# Patient Record
Sex: Female | Born: 1965 | Race: White | Hispanic: No | Marital: Married | Smoking: Former smoker
Health system: Southern US, Community
[De-identification: ages and names within clinical notes are randomized; demographics above are authoritative.]

## PROBLEM LIST (undated history)

## (undated) DIAGNOSIS — I10 Essential (primary) hypertension: Secondary | ICD-10-CM

## (undated) DIAGNOSIS — E781 Pure hyperglyceridemia: Secondary | ICD-10-CM

## (undated) DIAGNOSIS — M1990 Unspecified osteoarthritis, unspecified site: Secondary | ICD-10-CM

## (undated) DIAGNOSIS — M439 Deforming dorsopathy, unspecified: Secondary | ICD-10-CM

## (undated) DIAGNOSIS — G8190 Hemiplegia, unspecified affecting unspecified side: Secondary | ICD-10-CM

## (undated) DIAGNOSIS — R4701 Aphasia: Secondary | ICD-10-CM

## (undated) DIAGNOSIS — G43909 Migraine, unspecified, not intractable, without status migrainosus: Secondary | ICD-10-CM

## (undated) DIAGNOSIS — T7840XA Allergy, unspecified, initial encounter: Secondary | ICD-10-CM

## (undated) DIAGNOSIS — Z87442 Personal history of urinary calculi: Secondary | ICD-10-CM

## (undated) DIAGNOSIS — F32A Depression, unspecified: Secondary | ICD-10-CM

## (undated) DIAGNOSIS — J329 Chronic sinusitis, unspecified: Secondary | ICD-10-CM

## (undated) DIAGNOSIS — N200 Calculus of kidney: Secondary | ICD-10-CM

## (undated) DIAGNOSIS — I639 Cerebral infarction, unspecified: Secondary | ICD-10-CM

## (undated) DIAGNOSIS — C181 Malignant neoplasm of appendix: Secondary | ICD-10-CM

## (undated) DIAGNOSIS — F419 Anxiety disorder, unspecified: Secondary | ICD-10-CM

## (undated) HISTORY — PX: ANTERIOR CRUCIATE LIGAMENT REPAIR: SHX115

## (undated) HISTORY — PX: LIPOSUCTION: SHX10

## (undated) HISTORY — PX: MENISCUS REPAIR: SHX5179

## (undated) HISTORY — DX: Allergy, unspecified, initial encounter: T78.40XA

## (undated) HISTORY — PX: BUNIONECTOMY: SHX129

## (undated) HISTORY — PX: VARICOSE VEIN SURGERY: SHX832

## (undated) HISTORY — PX: LITHOTRIPSY: SUR834

## (undated) HISTORY — PX: SHOULDER SURGERY: SHX246

## (undated) HISTORY — PX: NASAL SEPTUM SURGERY: SHX37

## (undated) HISTORY — PX: BREAST SURGERY: SHX581

## (undated) HISTORY — DX: Unspecified osteoarthritis, unspecified site: M19.90

## (undated) HISTORY — PX: REDUCTION MAMMAPLASTY: SUR839

## (undated) HISTORY — PX: LASIK: SHX215

---

## 2006-07-30 HISTORY — PX: LASIK: SHX215

## 2014-12-05 ENCOUNTER — Emergency Department (HOSPITAL_COMMUNITY)
Admission: EM | Admit: 2014-12-05 | Discharge: 2014-12-05 | Disposition: A | Discharge status: Home / Self Care | Payer: Managed Care, Other (non HMO) | Attending: Emergency Medicine | Admitting: Emergency Medicine

## 2014-12-05 ENCOUNTER — Encounter (HOSPITAL_COMMUNITY): Payer: Self-pay | Admitting: *Deleted

## 2014-12-05 DIAGNOSIS — Y998 Other external cause status: Secondary | ICD-10-CM | POA: Diagnosis not present

## 2014-12-05 DIAGNOSIS — Y92410 Unspecified street and highway as the place of occurrence of the external cause: Secondary | ICD-10-CM | POA: Diagnosis not present

## 2014-12-05 DIAGNOSIS — S4991XA Unspecified injury of right shoulder and upper arm, initial encounter: Secondary | ICD-10-CM | POA: Diagnosis not present

## 2014-12-05 DIAGNOSIS — Y9389 Activity, other specified: Secondary | ICD-10-CM | POA: Diagnosis not present

## 2014-12-05 DIAGNOSIS — S199XXA Unspecified injury of neck, initial encounter: Secondary | ICD-10-CM | POA: Diagnosis present

## 2014-12-05 DIAGNOSIS — S161XXA Strain of muscle, fascia and tendon at neck level, initial encounter: Secondary | ICD-10-CM

## 2014-12-05 DIAGNOSIS — Z880 Allergy status to penicillin: Secondary | ICD-10-CM | POA: Diagnosis not present

## 2014-12-05 DIAGNOSIS — S0990XA Unspecified injury of head, initial encounter: Secondary | ICD-10-CM | POA: Diagnosis not present

## 2014-12-05 MED ORDER — DIAZEPAM 5 MG PO TABS
5.0000 mg | ORAL_TABLET | Freq: Three times a day (TID) | ORAL | Status: DC | PRN
  Administered 2014-12-05: 5 mg via ORAL
  Filled 2014-12-05: qty 1

## 2014-12-05 MED ORDER — DIAZEPAM 5 MG PO TABS: 5.0000 mg | ORAL_TABLET | Freq: Three times a day (TID) | ORAL | Status: DC | PRN
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
## 2014-12-14 ENCOUNTER — Emergency Department (HOSPITAL_COMMUNITY)
Admission: EM | Admit: 2014-12-14 | Discharge: 2014-12-14 | Disposition: A | Discharge status: Home / Self Care | Payer: Managed Care, Other (non HMO) | Attending: Emergency Medicine | Admitting: Emergency Medicine

## 2014-12-14 ENCOUNTER — Encounter (HOSPITAL_COMMUNITY): Payer: Self-pay

## 2014-12-14 ENCOUNTER — Emergency Department (HOSPITAL_COMMUNITY): Payer: Managed Care, Other (non HMO)

## 2014-12-14 DIAGNOSIS — Z87442 Personal history of urinary calculi: Secondary | ICD-10-CM | POA: Insufficient documentation

## 2014-12-14 DIAGNOSIS — Z8679 Personal history of other diseases of the circulatory system: Secondary | ICD-10-CM | POA: Insufficient documentation

## 2014-12-14 DIAGNOSIS — Z8639 Personal history of other endocrine, nutritional and metabolic disease: Secondary | ICD-10-CM | POA: Diagnosis not present

## 2014-12-14 DIAGNOSIS — F419 Anxiety disorder, unspecified: Secondary | ICD-10-CM | POA: Insufficient documentation

## 2014-12-14 DIAGNOSIS — R51 Headache: Secondary | ICD-10-CM | POA: Diagnosis present

## 2014-12-14 DIAGNOSIS — Z880 Allergy status to penicillin: Secondary | ICD-10-CM | POA: Diagnosis not present

## 2014-12-14 DIAGNOSIS — F0781 Postconcussional syndrome: Secondary | ICD-10-CM | POA: Diagnosis not present

## 2014-12-14 DIAGNOSIS — Z872 Personal history of diseases of the skin and subcutaneous tissue: Secondary | ICD-10-CM | POA: Insufficient documentation

## 2014-12-14 HISTORY — DX: Pure hyperglyceridemia: E78.1

## 2014-12-14 HISTORY — DX: Chronic sinusitis, unspecified: J32.9

## 2014-12-14 HISTORY — DX: Calculus of kidney: N20.0

## 2014-12-14 HISTORY — DX: Migraine, unspecified, not intractable, without status migrainosus: G43.909

## 2014-12-14 HISTORY — DX: Anxiety disorder, unspecified: F41.9

## 2014-12-14 MED ORDER — IBUPROFEN 800 MG PO TABS: 800.0000 mg | ORAL_TABLET | Freq: Three times a day (TID) | ORAL | Status: DC | PRN

## 2014-12-14 MED ORDER — SODIUM CHLORIDE 0.9 % IV BOLUS (SEPSIS)
1000.0000 mL | Freq: Once | INTRAVENOUS | Status: AC
  Administered 2014-12-14: 1000 mL via INTRAVENOUS

## 2014-12-14 MED ORDER — PROCHLORPERAZINE EDISYLATE 5 MG/ML IJ SOLN
10.0000 mg | Freq: Once | INTRAMUSCULAR | Status: AC
  Administered 2014-12-14: 10 mg via INTRAVENOUS
  Filled 2014-12-14: qty 2

## 2014-12-14 MED ORDER — KETOROLAC TROMETHAMINE 30 MG/ML IJ SOLN
30.0000 mg | Freq: Once | INTRAMUSCULAR | Status: AC
  Administered 2014-12-14: 30 mg via INTRAVENOUS
  Filled 2014-12-14: qty 1

## 2014-12-14 MED ORDER — BUTALBITAL-APAP-CAFFEINE 50-325-40 MG PO TABS: 1.0000 | ORAL_TABLET | Freq: Four times a day (QID) | ORAL | Status: DC | PRN

## 2014-12-14 MED ORDER — FENTANYL CITRATE (PF) 100 MCG/2ML IJ SOLN
100.0000 ug | Freq: Once | INTRAMUSCULAR | Status: AC
  Administered 2014-12-14: 100 ug via INTRAVENOUS
  Filled 2014-12-14: qty 2

## 2014-12-14 MED ORDER — DEXAMETHASONE SODIUM PHOSPHATE 10 MG/ML IJ SOLN
10.0000 mg | Freq: Once | INTRAMUSCULAR | Status: AC
  Administered 2014-12-14: 10 mg via INTRAVENOUS
  Filled 2014-12-14: qty 1

## 2014-12-14 MED ORDER — DIPHENHYDRAMINE HCL 50 MG/ML IJ SOLN
25.0000 mg | Freq: Once | INTRAMUSCULAR | Status: AC
  Administered 2014-12-14: 25 mg via INTRAVENOUS
  Filled 2014-12-14: qty 1

## 2014-12-14 NOTE — ED Notes (Signed)
Patient transported to CT 
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
## 2014-12-14 NOTE — Discharge Instructions (Signed)
Return here as needed.  Follow-up with the neurologist provided.  Increase your fluid intake
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
## 2014-12-16 ENCOUNTER — Other Ambulatory Visit: Payer: Self-pay | Admitting: Family Medicine

## 2014-12-16 DIAGNOSIS — E041 Nontoxic single thyroid nodule: Secondary | ICD-10-CM

## 2014-12-17 ENCOUNTER — Ambulatory Visit
Admission: RE | Admit: 2014-12-17 | Discharge: 2014-12-17 | Disposition: A | Discharge status: Home / Self Care | Payer: Managed Care, Other (non HMO) | Source: Ambulatory Visit | Attending: Family Medicine | Admitting: Family Medicine

## 2014-12-17 DIAGNOSIS — E041 Nontoxic single thyroid nodule: Secondary | ICD-10-CM

## 2014-12-20 ENCOUNTER — Other Ambulatory Visit: Payer: Self-pay | Admitting: Family Medicine

## 2014-12-20 DIAGNOSIS — E041 Nontoxic single thyroid nodule: Secondary | ICD-10-CM

## 2014-12-21 ENCOUNTER — Other Ambulatory Visit: Payer: Self-pay | Admitting: Family Medicine

## 2014-12-21 ENCOUNTER — Encounter: Payer: Self-pay | Admitting: Neurology

## 2014-12-21 ENCOUNTER — Ambulatory Visit (INDEPENDENT_AMBULATORY_CARE_PROVIDER_SITE_OTHER): Payer: Managed Care, Other (non HMO) | Admitting: Neurology

## 2014-12-21 VITALS — BP 140/80 | HR 98 | Resp 20 | Ht 67.0 in | Wt 185.3 lb

## 2014-12-21 DIAGNOSIS — G44301 Post-traumatic headache, unspecified, intractable: Secondary | ICD-10-CM

## 2014-12-21 DIAGNOSIS — E041 Nontoxic single thyroid nodule: Secondary | ICD-10-CM

## 2014-12-21 DIAGNOSIS — R519 Headache, unspecified: Secondary | ICD-10-CM | POA: Insufficient documentation

## 2014-12-21 DIAGNOSIS — R51 Headache: Secondary | ICD-10-CM
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
## 2014-12-21 NOTE — Patient Instructions (Signed)
1.  Stop the butalbital-acetaminophen-caffeine 2.  Limit use of ibuprofen to no more than 2 days out of the week 3.  Will refer you to either physical therapy or Dr. Hulan Saas 4.  FOllow up at end of July

## 2014-12-22 ENCOUNTER — Other Ambulatory Visit: Payer: Self-pay | Admitting: Family Medicine

## 2014-12-23 ENCOUNTER — Encounter: Payer: Self-pay | Admitting: Family Medicine

## 2014-12-23 ENCOUNTER — Other Ambulatory Visit: Payer: Self-pay | Admitting: *Deleted

## 2014-12-23 ENCOUNTER — Ambulatory Visit (INDEPENDENT_AMBULATORY_CARE_PROVIDER_SITE_OTHER): Payer: Managed Care, Other (non HMO) | Admitting: Family Medicine

## 2014-12-23 VITALS — BP 136/88 | HR 91 | Ht 67.0 in | Wt 183.0 lb

## 2014-12-23 DIAGNOSIS — G44301 Post-traumatic headache, unspecified, intractable: Secondary | ICD-10-CM

## 2014-12-23 MED ORDER — GABAPENTIN 100 MG PO CAPS: 200.0000 mg | ORAL_CAPSULE | Freq: Every day | ORAL | Status: DC

## 2014-12-23 NOTE — Assessment & Plan Note (Signed)
Difficult to keep patient on track today. Patient seems significantly anxious. Patient's unwilling to try osteopathic manipulation today. We did discuss other treatment options a could be beneficial. Patient given home exercises and icing protocol. We discussed other over-the-counter medications. Discuss because the patient's pain is worse at night trying [place] low dose of gabapentin could be more beneficial. Discussed trying to follow neurology's recommendations would also be helpful which patient is not doing at this time. Patient is traveling out of the state for the next 2 weeks and will come back afterwards. We will discuss the possibility of osteopathic manipulation after this. Patient responds better to the treatment suggested and patient will be more compliant.

## 2014-12-23 NOTE — Telephone Encounter (Signed)
Gabapentin sent into [facility].

## 2014-12-23 NOTE — Progress Notes (Signed)
Pre visit review using our clinic review tool, if applicable. No additional management support is needed unless otherwise documented below in the visit note. 
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
## 2014-12-28 ENCOUNTER — Telehealth: Payer: Self-pay | Admitting: Family Medicine

## 2014-12-28 NOTE — Telephone Encounter (Signed)
Pt called in and has a couple questions about what is exactly going on with her for ins   Best number [phone]

## 2014-12-28 NOTE — Telephone Encounter (Signed)
Per pt's request, mailed her the OV notes from when she saw Dr. Tamala Julian.

## 2015-01-17 ENCOUNTER — Encounter: Payer: Self-pay | Admitting: Family Medicine

## 2015-01-17 ENCOUNTER — Ambulatory Visit (INDEPENDENT_AMBULATORY_CARE_PROVIDER_SITE_OTHER): Payer: Managed Care, Other (non HMO) | Admitting: Family Medicine

## 2015-01-17 VITALS — BP 134/86 | HR 89 | Ht 67.0 in | Wt 183.0 lb

## 2015-01-17 DIAGNOSIS — G44301 Post-traumatic headache, unspecified, intractable: Secondary | ICD-10-CM | POA: Diagnosis not present

## 2015-01-17 MED ORDER — HYDROXYZINE HCL 25 MG PO TABS: 25.0000 mg | ORAL_TABLET | Freq: Three times a day (TID) | ORAL | Status: DC | PRN

## 2015-01-17 NOTE — Patient Instructions (Addendum)
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  Continue the vitamins Hydroxyzine up to 3 times a day for anxiety and headaches.  Tart cherry extract can help after working out on the back as well. See me again in 4-6 weeks to check in one more time.
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
## 2015-01-17 NOTE — Assessment & Plan Note (Signed)
Do believe that patient's headaches is secondary to more of the muscle imbalances, stress, and patient's postconcussive syndrome is now resolved. Patient was given some hydroxyzine to see if this would be beneficial for some of her anxiety type issues. We discussed continuing the home exercises as well as continuing the natural supplementations. Patient has noticed some improvement with these. Patient and will come back and see me again in 4-6 weeks for further evaluation and treatment.  Spent  25 minutes with patient face-to-face and had greater than 50% of counseling including as described above in assessment and plan.

## 2015-01-17 NOTE — Progress Notes (Signed)
Pre visit review using our clinic review tool, if applicable. No additional management support is needed unless otherwise documented below in the visit note. 

## 2015-01-18 ENCOUNTER — Ambulatory Visit
Admission: RE | Admit: 2015-01-18 | Discharge: 2015-01-18 | Disposition: A | Discharge status: Home / Self Care | Payer: Managed Care, Other (non HMO) | Source: Ambulatory Visit | Attending: Family Medicine | Admitting: Family Medicine

## 2015-01-18 ENCOUNTER — Other Ambulatory Visit (HOSPITAL_COMMUNITY)
Admission: RE | Admit: 2015-01-18 | Discharge: 2015-01-18 | Disposition: A | Discharge status: Home / Self Care | Payer: Managed Care, Other (non HMO) | Source: Ambulatory Visit | Attending: Interventional Radiology | Admitting: Interventional Radiology

## 2015-01-18 DIAGNOSIS — E041 Nontoxic single thyroid nodule: Secondary | ICD-10-CM

## 2015-02-21 ENCOUNTER — Encounter: Payer: Self-pay | Admitting: Neurology

## 2015-02-21 ENCOUNTER — Ambulatory Visit (INDEPENDENT_AMBULATORY_CARE_PROVIDER_SITE_OTHER): Payer: Managed Care, Other (non HMO) | Admitting: Neurology

## 2015-02-21 VITALS — BP 122/66 | HR 98 | Resp 20 | Ht 67.0 in | Wt 184.1 lb

## 2015-02-21 DIAGNOSIS — G44309 Post-traumatic headache, unspecified, not intractable: Secondary | ICD-10-CM

## 2015-02-21 DIAGNOSIS — F411 Generalized anxiety disorder: Secondary | ICD-10-CM | POA: Diagnosis not present
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
## 2015-03-01 ENCOUNTER — Ambulatory Visit: Payer: Managed Care, Other (non HMO) | Admitting: Family Medicine

## 2015-03-09 ENCOUNTER — Encounter: Payer: Self-pay | Admitting: Family Medicine

## 2015-03-09 ENCOUNTER — Ambulatory Visit (INDEPENDENT_AMBULATORY_CARE_PROVIDER_SITE_OTHER): Payer: Managed Care, Other (non HMO) | Admitting: Family Medicine

## 2015-03-09 VITALS — BP 130/84 | HR 94 | Ht 67.0 in | Wt 186.0 lb

## 2015-03-09 DIAGNOSIS — F411 Generalized anxiety disorder: Secondary | ICD-10-CM | POA: Diagnosis not present

## 2015-03-09 DIAGNOSIS — G44309 Post-traumatic headache, unspecified, not intractable: Secondary | ICD-10-CM | POA: Diagnosis not present

## 2015-03-09 NOTE — Patient Instructions (Addendum)
Good to see you Continue the hydroxyzine when you need it I wish you the best with your cat. I will refill the medicine when you need it See me when you need me.
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
## 2015-03-09 NOTE — Assessment & Plan Note (Signed)
Patient is doing significantly better at this time. I feel that she is back at her baseline and is not on any medications on a regular basis. Patient states she's been able to exercise on areolar basis as well without any significant discomfort. Patient's blood pressure is normal. I think the patient is at maximal improvement. Patient has beenreleased and can follow-up as needed.

## 2015-03-09 NOTE — Assessment & Plan Note (Signed)
Continue atarax.  See me PRN.

## 2015-03-09 NOTE — Progress Notes (Signed)
Pre visit review using our clinic review tool, if applicable. No additional management support is needed unless otherwise documented below in the visit note. 

## 2016-12-06 ENCOUNTER — Other Ambulatory Visit: Payer: Self-pay | Admitting: Family Medicine

## 2016-12-06 DIAGNOSIS — Z1231 Encounter for screening mammogram for malignant neoplasm of breast: Secondary | ICD-10-CM

## 2016-12-26 ENCOUNTER — Ambulatory Visit
Admission: RE | Admit: 2016-12-26 | Discharge: 2016-12-26 | Disposition: A | Discharge status: Home / Self Care | Payer: Managed Care, Other (non HMO) | Source: Ambulatory Visit | Attending: Family Medicine | Admitting: Family Medicine

## 2016-12-26 DIAGNOSIS — Z1231 Encounter for screening mammogram for malignant neoplasm of breast: Secondary | ICD-10-CM

## 2017-01-17 ENCOUNTER — Encounter: Payer: Self-pay | Admitting: Family Medicine

## 2017-03-08 ENCOUNTER — Other Ambulatory Visit: Payer: Self-pay | Admitting: Family Medicine

## 2017-03-11 NOTE — Telephone Encounter (Signed)
Refill denied. Pt has not been seen in 2 years.

## 2017-12-11 ENCOUNTER — Encounter: Payer: Self-pay | Admitting: Gastroenterology

## 2017-12-12 ENCOUNTER — Other Ambulatory Visit: Payer: Self-pay | Admitting: Family Medicine

## 2017-12-12 DIAGNOSIS — Z1231 Encounter for screening mammogram for malignant neoplasm of breast: Secondary | ICD-10-CM

## 2018-01-21 ENCOUNTER — Emergency Department (HOSPITAL_COMMUNITY)
Admission: EM | Admit: 2018-01-21 | Discharge: 2018-01-21 | Disposition: A | Discharge status: Home / Self Care | Payer: Managed Care, Other (non HMO) | Attending: Emergency Medicine | Admitting: Emergency Medicine

## 2018-01-21 ENCOUNTER — Encounter (HOSPITAL_COMMUNITY): Payer: Self-pay

## 2018-01-21 ENCOUNTER — Other Ambulatory Visit: Payer: Self-pay

## 2018-01-21 ENCOUNTER — Emergency Department (HOSPITAL_COMMUNITY): Payer: Managed Care, Other (non HMO)

## 2018-01-21 DIAGNOSIS — M542 Cervicalgia: Secondary | ICD-10-CM | POA: Diagnosis not present

## 2018-01-21 DIAGNOSIS — Y9389 Activity, other specified: Secondary | ICD-10-CM | POA: Insufficient documentation

## 2018-01-21 DIAGNOSIS — Z79899 Other long term (current) drug therapy: Secondary | ICD-10-CM | POA: Insufficient documentation

## 2018-01-21 DIAGNOSIS — H5789 Other specified disorders of eye and adnexa: Secondary | ICD-10-CM | POA: Diagnosis not present

## 2018-01-21 DIAGNOSIS — Y998 Other external cause status: Secondary | ICD-10-CM | POA: Diagnosis not present

## 2018-01-21 DIAGNOSIS — Y92410 Unspecified street and highway as the place of occurrence of the external cause: Secondary | ICD-10-CM | POA: Diagnosis not present

## 2018-01-21 DIAGNOSIS — R51 Headache: Secondary | ICD-10-CM | POA: Diagnosis not present

## 2018-01-21 DIAGNOSIS — R110 Nausea: Secondary | ICD-10-CM | POA: Insufficient documentation

## 2018-01-21 DIAGNOSIS — H53149 Visual discomfort, unspecified: Secondary | ICD-10-CM | POA: Diagnosis not present

## 2018-01-21 DIAGNOSIS — Z87891 Personal history of nicotine dependence: Secondary | ICD-10-CM | POA: Insufficient documentation

## 2018-01-21 MED ORDER — METHOCARBAMOL 500 MG PO TABS: 500.0000 mg | ORAL_TABLET | Freq: Two times a day (BID) | ORAL | 0 refills | Status: DC

## 2018-01-21 MED ORDER — IBUPROFEN 600 MG PO TABS: 600.0000 mg | ORAL_TABLET | Freq: Four times a day (QID) | ORAL | 0 refills | Status: DC | PRN

## 2018-01-21 MED ORDER — METOCLOPRAMIDE HCL 5 MG/ML IJ SOLN
10.0000 mg | Freq: Once | INTRAMUSCULAR | Status: AC
  Administered 2018-01-21: 10 mg via INTRAVENOUS
  Filled 2018-01-21: qty 2

## 2018-01-21 MED ORDER — KETOROLAC TROMETHAMINE 30 MG/ML IJ SOLN
30.0000 mg | Freq: Once | INTRAMUSCULAR | Status: AC
  Administered 2018-01-21: 30 mg via INTRAVENOUS
  Filled 2018-01-21: qty 1

## 2018-01-21 MED ORDER — SODIUM CHLORIDE 0.9 % IV BOLUS
1000.0000 mL | Freq: Once | INTRAVENOUS | Status: AC
  Administered 2018-01-21: 1000 mL via INTRAVENOUS

## 2018-01-21 MED ORDER — ACETAMINOPHEN 500 MG PO TABS: 500.0000 mg | ORAL_TABLET | Freq: Four times a day (QID) | ORAL | 0 refills | Status: DC | PRN
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
## 2018-01-21 NOTE — ED Notes (Signed)
Pt. Unable to sign. Pt. Did not have questions about paperwork.

## 2018-01-29 ENCOUNTER — Ambulatory Visit (AMBULATORY_SURGERY_CENTER): Payer: Self-pay | Admitting: *Deleted

## 2018-01-29 ENCOUNTER — Other Ambulatory Visit: Payer: Self-pay

## 2018-01-29 DIAGNOSIS — Z1211 Encounter for screening for malignant neoplasm of colon: Secondary | ICD-10-CM

## 2018-01-29 MED ORDER — SUPREP BOWEL PREP KIT 17.5-3.13-1.6 GM/177ML PO SOLN: 1.0000 | Freq: Once | ORAL | 0 refills | Status: AC
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
## 2018-02-12 ENCOUNTER — Encounter: Payer: Self-pay | Admitting: Gastroenterology

## 2018-02-12 ENCOUNTER — Ambulatory Visit (AMBULATORY_SURGERY_CENTER): Payer: Managed Care, Other (non HMO) | Admitting: Gastroenterology

## 2018-02-12 VITALS — BP 148/96 | HR 82 | Temp 99.1°F | Resp 22 | Ht 67.5 in | Wt 171.0 lb

## 2018-02-12 DIAGNOSIS — Z1211 Encounter for screening for malignant neoplasm of colon: Secondary | ICD-10-CM | POA: Diagnosis present

## 2018-02-12 DIAGNOSIS — D122 Benign neoplasm of ascending colon: Secondary | ICD-10-CM

## 2018-02-12 MED ORDER — SODIUM CHLORIDE 0.9 % IV SOLN: 500.0000 mL | INTRAVENOUS | Status: DC

## 2018-02-12 NOTE — Patient Instructions (Signed)
YOU HAD AN ENDOSCOPIC PROCEDURE TODAY AT THE [facility]:   Refer to the procedure report that was given to you for any specific questions about what was found during the examination.  If the procedure report does not answer your questions, please call your gastroenterologist to clarify.  If you requested that your care partner not be given the details of your procedure findings, then the procedure report has been included in a sealed envelope for you to review at your convenience later.  YOU SHOULD EXPECT: Some feelings of bloating in the abdomen. Passage of more gas than usual.  Walking can help get rid of the air that was put into your GI tract during the procedure and reduce the bloating. If you had a lower endoscopy (such as a colonoscopy or flexible sigmoidoscopy) you may notice spotting of blood in your stool or on the toilet paper. If you underwent a bowel prep for your procedure, you may not have a normal bowel movement for a few days.  Please Note:  You might notice some irritation and congestion in your nose or some drainage.  This is from the oxygen used during your procedure.  There is no need for concern and it should clear up in a day or so.  SYMPTOMS TO REPORT IMMEDIATELY:   Following lower endoscopy (colonoscopy or flexible sigmoidoscopy):  Excessive amounts of blood in the stool  Significant tenderness or worsening of abdominal pains  Swelling of the abdomen that is new, acute  Fever of 100F or higher   For urgent or emergent issues, a gastroenterologist can be reached at any hour by calling [phone].   DIET:  We do recommend a small meal at first, but then you may proceed to your regular diet.  Drink plenty of fluids but you should avoid alcoholic beverages for 24 hours.  ACTIVITY:  You should plan to take it easy for the rest of today and you should NOT DRIVE or use heavy machinery until tomorrow (because of the sedation medicines used during the test).     FOLLOW UP: Our staff will call the number listed on your records the next business day following your procedure to check on you and address any questions or concerns that you may have regarding the information given to you following your procedure. If we do not reach you, we will leave a message.  However, if you are feeling well and you are not experiencing any problems, there is no need to return our call.  We will assume that you have returned to your regular daily activities without incident.  If any biopsies were taken you will be contacted by phone or by letter within the next 1-3 weeks.  Please call us at [phone] if you have not heard about the biopsies in 3 weeks.    SIGNATURES/CONFIDENTIALITY: You and/or your care partner have signed paperwork which will be entered into your electronic medical record.  These signatures attest to the fact that that the information above on your After Visit Summary has been reviewed and is understood.  Full responsibility of the confidentiality of this discharge information lies with you and/or your care-partner.  Polyp, diverticulosis, hemorrhoid information given. 

## 2018-02-12 NOTE — Progress Notes (Signed)
Report to PACU, RN, vss, BBS= Clear.  

## 2018-02-12 NOTE — Op Note (Signed)
[facility] Patient Name: Melanie Madden Procedure Date: 02/12/2018 1:15 PM MRN: [id number] Endoscopist: Mauri Pole , MD Age: 52 Referring MD:  Date of Birth: [birth year] Gender: Female Account #: [id number] Procedure:                Colonoscopy Indications:              Screening for colorectal malignant neoplasm Medicines:                Monitored Anesthesia Care Procedure:                Pre-Anesthesia Assessment:                           - Prior to the procedure, a History and Physical                            was performed, and patient medications and                            allergies were reviewed. The patient's tolerance of                            previous anesthesia was also reviewed. The risks                            and benefits of the procedure and the sedation                            options and risks were discussed with the patient.                            All questions were answered, and informed consent                            was obtained. Prior Anticoagulants: The patient has                            taken no previous anticoagulant or antiplatelet                            agents. ASA Grade Assessment: II - A patient with                            mild systemic disease. After reviewing the risks                            and benefits, the patient was deemed in                            satisfactory condition to undergo the procedure.                           After obtaining informed consent, the colonoscope  was passed under direct vision. Throughout the                            procedure, the patient's blood pressure, pulse, and                            oxygen saturations were monitored continuously. The                            Colonoscope was introduced through the anus and                            advanced to the the cecum, identified by                            appendiceal orifice and  ileocecal valve. The                            colonoscopy was performed without difficulty. The                            patient tolerated the procedure well. The quality                            of the bowel preparation was adequate. The                            ileocecal valve, appendiceal orifice, and rectum                            were photographed. Scope In: 1:35:38 PM Scope Out: 1:56:29 PM Scope Withdrawal Time: 0 hours 13 minutes 54 seconds  Total Procedure Duration: 0 hours 20 minutes 51 seconds  Findings:                 The perianal and digital rectal examinations were                            normal.                           Two sessile polyps were found in the ascending                            colon. The polyps were 1 to 2 mm in size. These                            polyps were removed with a cold biopsy forceps.                            Resection and retrieval were complete.                           A few small-mouthed diverticula were found in the  sigmoid colon.                           Non-bleeding internal hemorrhoids were found during                            retroflexion. The hemorrhoids were small. Complications:            No immediate complications. Estimated Blood Loss:     Estimated blood loss was minimal. Impression:               - Two 1 to 2 mm polyps in the ascending colon,                            removed with a cold biopsy forceps. Resected and                            retrieved.                           - Diverticulosis in the sigmoid colon.                           - Non-bleeding internal hemorrhoids. Recommendation:           - Patient has a contact number available for                            emergencies. The signs and symptoms of potential                            delayed complications were discussed with the                            patient. Return to normal activities tomorrow.                             Written discharge instructions were provided to the                            patient.                           - Resume previous diet.                           - Continue present medications.                           - Await pathology results.                           - Repeat colonoscopy in 5-10 years for surveillance                            based on pathology results. Mauri Pole, MD 02/12/2018 2:01:50 PM This report has been signed electronically.

## 2018-02-12 NOTE — Progress Notes (Signed)
Called to room to assist during endoscopic procedure.  Patient ID and intended procedure confirmed with present staff. Received instructions for my participation in the procedure from the performing physician.  
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
## 2018-02-13 ENCOUNTER — Telehealth: Payer: Self-pay

## 2018-02-13 NOTE — Telephone Encounter (Signed)
  Follow up Call-  Call Melanie Madden number 02/12/2018  Post procedure Call Melanie Madden phone  # [phone]  Permission to leave phone message Yes  Some recent data might be hidden     Patient questions:  Do you have a fever, pain , or abdominal swelling? No. Pain Score  0 *  Have you tolerated food without any problems? Yes.    Have you been able to return to your normal activities? Yes.    Do you have any questions about your discharge instructions: Diet   No. Medications  No. Follow up visit  No.  Do you have questions or concerns about your Care? No.  Actions: * If pain score is 4 or above: No action needed, pain <4.

## 2018-02-13 NOTE — Telephone Encounter (Signed)
Left message

## 2018-02-20 ENCOUNTER — Encounter: Payer: Self-pay | Admitting: Gastroenterology

## 2018-07-17 ENCOUNTER — Other Ambulatory Visit: Payer: Self-pay

## 2018-07-17 ENCOUNTER — Encounter (HOSPITAL_COMMUNITY): Payer: Self-pay

## 2018-07-17 ENCOUNTER — Emergency Department (HOSPITAL_COMMUNITY)
Admission: EM | Admit: 2018-07-17 | Discharge: 2018-07-18 | Disposition: A | Discharge status: Home / Self Care | Payer: Managed Care, Other (non HMO) | Attending: Emergency Medicine | Admitting: Emergency Medicine

## 2018-07-17 ENCOUNTER — Emergency Department (HOSPITAL_COMMUNITY): Payer: Managed Care, Other (non HMO)

## 2018-07-17 DIAGNOSIS — Z87891 Personal history of nicotine dependence: Secondary | ICD-10-CM | POA: Insufficient documentation

## 2018-07-17 DIAGNOSIS — Y999 Unspecified external cause status: Secondary | ICD-10-CM | POA: Insufficient documentation

## 2018-07-17 DIAGNOSIS — S161XXA Strain of muscle, fascia and tendon at neck level, initial encounter: Secondary | ICD-10-CM

## 2018-07-17 DIAGNOSIS — Y9389 Activity, other specified: Secondary | ICD-10-CM | POA: Insufficient documentation

## 2018-07-17 DIAGNOSIS — Z79899 Other long term (current) drug therapy: Secondary | ICD-10-CM | POA: Insufficient documentation

## 2018-07-17 DIAGNOSIS — Y929 Unspecified place or not applicable: Secondary | ICD-10-CM | POA: Insufficient documentation

## 2018-07-17 MED ORDER — METHOCARBAMOL 500 MG PO TABS
500.0000 mg | ORAL_TABLET | Freq: Once | ORAL | Status: AC
  Administered 2018-07-17: 500 mg via ORAL
  Filled 2018-07-17: qty 1

## 2018-07-17 MED ORDER — METHOCARBAMOL 500 MG PO TABS: 500.0000 mg | ORAL_TABLET | Freq: Three times a day (TID) | ORAL | 0 refills | Status: DC | PRN

## 2018-07-17 MED ORDER — NAPROXEN 500 MG PO TABS: 500.0000 mg | ORAL_TABLET | Freq: Two times a day (BID) | ORAL | 0 refills | Status: DC | PRN

## 2018-07-17 MED ORDER — OXYCODONE-ACETAMINOPHEN 5-325 MG PO TABS
1.0000 | ORAL_TABLET | Freq: Once | ORAL | Status: AC
  Administered 2018-07-17: 1 via ORAL
  Filled 2018-07-17: qty 1

## 2018-07-17 MED ORDER — NAPROXEN 250 MG PO TABS
500.0000 mg | ORAL_TABLET | Freq: Once | ORAL | Status: AC
  Administered 2018-07-17: 500 mg via ORAL
  Filled 2018-07-17: qty 2
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
## 2018-08-04 ENCOUNTER — Ambulatory Visit
Admission: RE | Admit: 2018-08-04 | Discharge: 2018-08-04 | Disposition: A | Discharge status: Home / Self Care | Payer: Managed Care, Other (non HMO) | Source: Ambulatory Visit | Attending: Family Medicine | Admitting: Family Medicine

## 2018-08-04 DIAGNOSIS — Z1231 Encounter for screening mammogram for malignant neoplasm of breast: Secondary | ICD-10-CM

## 2018-09-01 ENCOUNTER — Ambulatory Visit: Payer: 59 | Admitting: Psychology

## 2018-09-01 DIAGNOSIS — F418 Other specified anxiety disorders: Secondary | ICD-10-CM

## 2018-09-15 ENCOUNTER — Ambulatory Visit (INDEPENDENT_AMBULATORY_CARE_PROVIDER_SITE_OTHER): Payer: 59 | Admitting: Psychology

## 2018-09-15 DIAGNOSIS — F418 Other specified anxiety disorders: Secondary | ICD-10-CM | POA: Diagnosis not present

## 2018-09-29 ENCOUNTER — Ambulatory Visit: Payer: 59 | Admitting: Psychology

## 2018-10-08 ENCOUNTER — Ambulatory Visit: Payer: 59 | Admitting: Psychology

## 2018-10-08 DIAGNOSIS — F418 Other specified anxiety disorders: Secondary | ICD-10-CM | POA: Diagnosis not present

## 2018-10-20 ENCOUNTER — Ambulatory Visit (INDEPENDENT_AMBULATORY_CARE_PROVIDER_SITE_OTHER): Payer: 59 | Admitting: Psychology

## 2018-10-20 DIAGNOSIS — F418 Other specified anxiety disorders: Secondary | ICD-10-CM

## 2019-07-09 ENCOUNTER — Inpatient Hospital Stay (HOSPITAL_COMMUNITY)
Admission: EM | Admit: 2019-07-09 | Discharge: 2019-07-18 | DRG: 064 | Disposition: A | Discharge status: Rehabilitation Facility | Payer: Managed Care, Other (non HMO) | Attending: Neurology | Admitting: Neurology

## 2019-07-09 ENCOUNTER — Other Ambulatory Visit: Payer: Self-pay

## 2019-07-09 ENCOUNTER — Emergency Department (HOSPITAL_COMMUNITY): Payer: Managed Care, Other (non HMO)

## 2019-07-09 ENCOUNTER — Encounter (HOSPITAL_COMMUNITY): Payer: Self-pay | Admitting: Neurology

## 2019-07-09 DIAGNOSIS — G43909 Migraine, unspecified, not intractable, without status migrainosus: Secondary | ICD-10-CM | POA: Diagnosis present

## 2019-07-09 DIAGNOSIS — E46 Unspecified protein-calorie malnutrition: Secondary | ICD-10-CM | POA: Diagnosis not present

## 2019-07-09 DIAGNOSIS — Z8349 Family history of other endocrine, nutritional and metabolic diseases: Secondary | ICD-10-CM

## 2019-07-09 DIAGNOSIS — I161 Hypertensive emergency: Secondary | ICD-10-CM | POA: Diagnosis present

## 2019-07-09 DIAGNOSIS — Z79899 Other long term (current) drug therapy: Secondary | ICD-10-CM | POA: Diagnosis not present

## 2019-07-09 DIAGNOSIS — R29810 Facial weakness: Secondary | ICD-10-CM | POA: Diagnosis present

## 2019-07-09 DIAGNOSIS — R29715 NIHSS score 15: Secondary | ICD-10-CM | POA: Diagnosis present

## 2019-07-09 DIAGNOSIS — A499 Bacterial infection, unspecified: Secondary | ICD-10-CM | POA: Diagnosis not present

## 2019-07-09 DIAGNOSIS — D72829 Elevated white blood cell count, unspecified: Secondary | ICD-10-CM | POA: Diagnosis present

## 2019-07-09 DIAGNOSIS — N390 Urinary tract infection, site not specified: Secondary | ICD-10-CM | POA: Diagnosis not present

## 2019-07-09 DIAGNOSIS — D696 Thrombocytopenia, unspecified: Secondary | ICD-10-CM | POA: Diagnosis not present

## 2019-07-09 DIAGNOSIS — E870 Hyperosmolality and hypernatremia: Secondary | ICD-10-CM | POA: Diagnosis not present

## 2019-07-09 DIAGNOSIS — Z881 Allergy status to other antibiotic agents status: Secondary | ICD-10-CM

## 2019-07-09 DIAGNOSIS — I629 Nontraumatic intracranial hemorrhage, unspecified: Secondary | ICD-10-CM | POA: Diagnosis present

## 2019-07-09 DIAGNOSIS — R748 Abnormal levels of other serum enzymes: Secondary | ICD-10-CM | POA: Diagnosis present

## 2019-07-09 DIAGNOSIS — Z8249 Family history of ischemic heart disease and other diseases of the circulatory system: Secondary | ICD-10-CM

## 2019-07-09 DIAGNOSIS — I639 Cerebral infarction, unspecified: Secondary | ICD-10-CM | POA: Diagnosis not present

## 2019-07-09 DIAGNOSIS — R1312 Dysphagia, oropharyngeal phase: Secondary | ICD-10-CM | POA: Diagnosis not present

## 2019-07-09 DIAGNOSIS — R1310 Dysphagia, unspecified: Secondary | ICD-10-CM | POA: Diagnosis present

## 2019-07-09 DIAGNOSIS — R414 Neurologic neglect syndrome: Secondary | ICD-10-CM | POA: Diagnosis present

## 2019-07-09 DIAGNOSIS — Z833 Family history of diabetes mellitus: Secondary | ICD-10-CM

## 2019-07-09 DIAGNOSIS — D329 Benign neoplasm of meninges, unspecified: Secondary | ICD-10-CM | POA: Diagnosis present

## 2019-07-09 DIAGNOSIS — G936 Cerebral edema: Secondary | ICD-10-CM

## 2019-07-09 DIAGNOSIS — I619 Nontraumatic intracerebral hemorrhage, unspecified: Secondary | ICD-10-CM | POA: Diagnosis present

## 2019-07-09 DIAGNOSIS — R739 Hyperglycemia, unspecified: Secondary | ICD-10-CM | POA: Diagnosis present

## 2019-07-09 DIAGNOSIS — Z87891 Personal history of nicotine dependence: Secondary | ICD-10-CM | POA: Diagnosis not present

## 2019-07-09 DIAGNOSIS — Z20828 Contact with and (suspected) exposure to other viral communicable diseases: Secondary | ICD-10-CM | POA: Diagnosis present

## 2019-07-09 DIAGNOSIS — E782 Mixed hyperlipidemia: Secondary | ICD-10-CM | POA: Diagnosis not present

## 2019-07-09 DIAGNOSIS — E7800 Pure hypercholesterolemia, unspecified: Secondary | ICD-10-CM | POA: Diagnosis not present

## 2019-07-09 DIAGNOSIS — I6389 Other cerebral infarction: Secondary | ICD-10-CM | POA: Diagnosis not present

## 2019-07-09 DIAGNOSIS — R4182 Altered mental status, unspecified: Secondary | ICD-10-CM | POA: Diagnosis present

## 2019-07-09 DIAGNOSIS — E8809 Other disorders of plasma-protein metabolism, not elsewhere classified: Secondary | ICD-10-CM | POA: Diagnosis not present

## 2019-07-09 DIAGNOSIS — D62 Acute posthemorrhagic anemia: Secondary | ICD-10-CM | POA: Diagnosis not present

## 2019-07-09 DIAGNOSIS — I610 Nontraumatic intracerebral hemorrhage in hemisphere, subcortical: Secondary | ICD-10-CM | POA: Diagnosis present

## 2019-07-09 DIAGNOSIS — I10 Essential (primary) hypertension: Secondary | ICD-10-CM | POA: Diagnosis present

## 2019-07-09 DIAGNOSIS — E876 Hypokalemia: Secondary | ICD-10-CM | POA: Diagnosis present

## 2019-07-09 DIAGNOSIS — Z820 Family history of epilepsy and other diseases of the nervous system: Secondary | ICD-10-CM

## 2019-07-09 DIAGNOSIS — R4701 Aphasia: Secondary | ICD-10-CM

## 2019-07-09 DIAGNOSIS — F419 Anxiety disorder, unspecified: Secondary | ICD-10-CM | POA: Diagnosis present

## 2019-07-09 DIAGNOSIS — Z4659 Encounter for fitting and adjustment of other gastrointestinal appliance and device: Secondary | ICD-10-CM

## 2019-07-09 DIAGNOSIS — G8191 Hemiplegia, unspecified affecting right dominant side: Secondary | ICD-10-CM | POA: Diagnosis present

## 2019-07-09 DIAGNOSIS — Z91013 Allergy to seafood: Secondary | ICD-10-CM

## 2019-07-09 DIAGNOSIS — R1313 Dysphagia, pharyngeal phase: Secondary | ICD-10-CM | POA: Diagnosis not present

## 2019-07-09 HISTORY — DX: Cerebral infarction, unspecified: I63.9

## 2019-07-09 LAB — PROTIME-INR
INR: 1 (ref 0.8–1.2)
Prothrombin Time: 12.9 seconds (ref 11.4–15.2)

## 2019-07-09 LAB — CBC
HCT: 41.6 % (ref 36.0–46.0)
Hemoglobin: 14.3 g/dL (ref 12.0–15.0)
MCH: 30.6 pg (ref 26.0–34.0)
MCHC: 34.4 g/dL (ref 30.0–36.0)
MCV: 89.1 fL (ref 80.0–100.0)
Platelets: 191 10*3/uL (ref 150–400)
RBC: 4.67 MIL/uL (ref 3.87–5.11)
RDW: 13.1 % (ref 11.5–15.5)
WBC: 8.1 10*3/uL (ref 4.0–10.5)
nRBC: 0 % (ref 0.0–0.2)

## 2019-07-09 LAB — I-STAT CHEM 8, ED
BUN: 24 mg/dL — ABNORMAL HIGH (ref 6–20)
Calcium, Ion: 1.21 mmol/L (ref 1.15–1.40)
Chloride: 107 mmol/L (ref 98–111)
Creatinine, Ser: 0.9 mg/dL (ref 0.44–1.00)
Glucose, Bld: 107 mg/dL — ABNORMAL HIGH (ref 70–99)
HCT: 41 % (ref 36.0–46.0)
Hemoglobin: 13.9 g/dL (ref 12.0–15.0)
Potassium: 3.8 mmol/L (ref 3.5–5.1)
Sodium: 138 mmol/L (ref 135–145)
TCO2: 24 mmol/L (ref 22–32)

## 2019-07-09 LAB — COMPREHENSIVE METABOLIC PANEL
ALT: 25 U/L (ref 0–44)
AST: 21 U/L (ref 15–41)
Albumin: 3.8 g/dL (ref 3.5–5.0)
Alkaline Phosphatase: 49 U/L (ref 38–126)
Anion gap: 10 (ref 5–15)
BUN: 23 mg/dL — ABNORMAL HIGH (ref 6–20)
CO2: 21 mmol/L — ABNORMAL LOW (ref 22–32)
Calcium: 10.2 mg/dL (ref 8.9–10.3)
Chloride: 106 mmol/L (ref 98–111)
Creatinine, Ser: 0.92 mg/dL (ref 0.44–1.00)
GFR calc Af Amer: >60 mL/min (ref >60)
GFR calc non Af Amer: >60 mL/min (ref >60)
Glucose, Bld: 112 mg/dL — ABNORMAL HIGH (ref 70–99)
Potassium: 3.8 mmol/L (ref 3.5–5.1)
Sodium: 137 mmol/L (ref 135–145)
Total Bilirubin: 0.9 mg/dL (ref 0.3–1.2)
Total Protein: 6.6 g/dL (ref 6.5–8.1)

## 2019-07-09 LAB — DIFFERENTIAL
Abs Immature Granulocytes: 0.01 10*3/uL (ref 0.00–0.07)
Basophils Absolute: 0 10*3/uL (ref 0.0–0.1)
Basophils Relative: 0 %
Eosinophils Absolute: 0.1 10*3/uL (ref 0.0–0.5)
Eosinophils Relative: 1 %
Immature Granulocytes: 0 %
Lymphocytes Relative: 39 %
Lymphs Abs: 3.2 10*3/uL (ref 0.7–4.0)
Monocytes Absolute: 0.8 10*3/uL (ref 0.1–1.0)
Monocytes Relative: 9 %
Neutro Abs: 4.1 10*3/uL (ref 1.7–7.7)
Neutrophils Relative %: 51 %

## 2019-07-09 LAB — I-STAT BETA HCG BLOOD, ED (MC, WL, AP ONLY): I-stat hCG, quantitative: <5 m[IU]/mL (ref <5)

## 2019-07-09 LAB — APTT: aPTT: 29 seconds (ref 24–36)

## 2019-07-09 LAB — CBG MONITORING, ED: Glucose-Capillary: 105 mg/dL — ABNORMAL HIGH (ref 70–99)

## 2019-07-09 MED ORDER — STROKE: EARLY STAGES OF RECOVERY BOOK
Freq: Once | Status: AC
  Administered 2019-07-09
  Filled 2019-07-09: qty 1

## 2019-07-09 MED ORDER — SENNOSIDES-DOCUSATE SODIUM 8.6-50 MG PO TABS
1.0000 | ORAL_TABLET | Freq: Two times a day (BID) | ORAL | Status: DC
  Filled 2019-07-09: qty 1

## 2019-07-09 MED ORDER — ACETAMINOPHEN 650 MG RE SUPP: 650.0000 mg | RECTAL | Status: DC | PRN

## 2019-07-09 MED ORDER — PANTOPRAZOLE SODIUM 40 MG IV SOLR
40.0000 mg | Freq: Every day | INTRAVENOUS | Status: DC
  Administered 2019-07-10 – 2019-07-12 (×3): 40 mg via INTRAVENOUS
  Filled 2019-07-09 (×3): qty 40

## 2019-07-09 MED ORDER — ACETAMINOPHEN 160 MG/5ML PO SOLN: 650.0000 mg | ORAL | Status: DC | PRN

## 2019-07-09 MED ORDER — SODIUM CHLORIDE 0.9% FLUSH
3.0000 mL | Freq: Once | INTRAVENOUS | Status: AC
  Administered 2019-07-10: 3 mL via INTRAVENOUS

## 2019-07-09 MED ORDER — ACETAMINOPHEN 325 MG PO TABS: 650.0000 mg | ORAL_TABLET | ORAL | Status: DC | PRN

## 2019-07-09 MED ORDER — CLEVIDIPINE BUTYRATE 0.5 MG/ML IV EMUL
0.0000 mg/h | INTRAVENOUS | Status: DC
  Administered 2019-07-09: 2 mg/h via INTRAVENOUS
  Administered 2019-07-09 (×2): 14 mg/h via INTRAVENOUS
  Administered 2019-07-10 (×3): 12 mg/h via INTRAVENOUS
  Administered 2019-07-10: 1 mg/h via INTRAVENOUS
  Administered 2019-07-10: 12 mg/h via INTRAVENOUS
  Administered 2019-07-11 (×4): 13 mg/h via INTRAVENOUS
  Administered 2019-07-12: 10 mg/h via INTRAVENOUS
  Administered 2019-07-12: 4 mg/h via INTRAVENOUS
  Administered 2019-07-12 (×2): 13 mg/h via INTRAVENOUS
  Administered 2019-07-13 (×2): 4 mg/h via INTRAVENOUS
  Filled 2019-07-09 (×2): qty 50
  Filled 2019-07-09: qty 100
  Filled 2019-07-09: qty 50
  Filled 2019-07-09 (×2): qty 100
  Filled 2019-07-09: qty 50
  Filled 2019-07-09 (×13): qty 100

## 2019-07-09 NOTE — ED Provider Notes (Signed)
[facility] Emergency Department Provider Note MRN:  [id number]  Arrival date & time: 07/09/19     Chief Complaint   Code Stroke   History of Present Illness   Melanie Madden is a 53 y.o. year-old female with a history of migraines presenting to the ED with chief complaint of code stroke.  Last known normal 1700.  Patient nonverbal, right-sided weakness.  I was unable to obtain an accurate HPI, PMH, or ROS due to the patient's altered mental status.  Level 5 caveat.  Review of Systems  Positive for altered mental status.  Patient's Health History    Past Medical History:  Diagnosis Date  . Allergy   . Anxiety   . Arthritis   . High triglycerides   . Kidney stones   . Migraines   . Recurrent sinus infections     Past Surgical History:  Procedure Laterality Date  . ANTERIOR CRUCIATE LIGAMENT REPAIR Left   . BREAST SURGERY     breast reduction  . BUNIONECTOMY Right   . LASIK    . LITHOTRIPSY    . MENISCUS REPAIR Left   . NASAL SEPTUM SURGERY    . REDUCTION MAMMAPLASTY     bilateral  . SHOULDER SURGERY    . VARICOSE VEIN SURGERY Left     Family History  Problem Relation Age of Onset  . Alzheimer's disease Mother   . Hypertension Mother   . Diabetes Mother   . Thyroid disease Mother   . Deep vein thrombosis Maternal Grandmother   . Colon cancer Neg Hx   . Colon polyps Neg Hx   . Esophageal cancer Neg Hx   . Pancreatic cancer Neg Hx   . Rectal cancer Neg Hx   . Stomach cancer Neg Hx   . Breast cancer Neg Hx     Social History   Socioeconomic History  . Marital status: Single    Spouse name: Not on file  . Number of children: Not on file  . Years of education: Not on file  . Highest education level: Not on file  Occupational History  . Not on file  Tobacco Use  . Smoking status: Former Research scientist (life sciences)  . Smokeless tobacco: Never Used  Substance and Sexual Activity  . Alcohol use: Yes    Alcohol/week: 0.0 standard drinks    Comment:  social   . Drug use: No  . Sexual activity: Yes    Partners: Male  Other Topics Concern  . Not on file  Social History Narrative  . Not on file   Social Determinants of Health   Financial Resource Strain:   . Difficulty of Paying Living Expenses: Not on file  Food Insecurity:   . Worried About Charity fundraiser in the Last Year: Not on file  . Ran Out of Food in the Last Year: Not on file  Transportation Needs:   . Lack of Transportation (Medical): Not on file  . Lack of Transportation (Non-Medical): Not on file  Physical Activity:   . Days of Exercise per Week: Not on file  . Minutes of Exercise per Session: Not on file  Stress:   . Feeling of Stress : Not on file  Social Connections:   . Frequency of Communication with Friends and Family: Not on file  . Frequency of Social Gatherings with Friends and Family: Not on file  . Attends Religious Services: Not on file  . Active Member of Clubs or Organizations: Not on file  .  Attends Archivist Meetings: Not on file  . Marital Status: Not on file  Intimate Partner Violence:   . Fear of Current or Ex-Partner: Not on file  . Emotionally Abused: Not on file  . Physically Abused: Not on file  . Sexually Abused: Not on file     Physical Exam  Vital Signs and Nursing Notes reviewed Vitals:   07/09/19 2032 07/09/19 2034  BP: 135/78 136/75  Pulse: 89 92  Resp: 13 16  SpO2: 100% 100%    CONSTITUTIONAL: Ill-appearing, NAD NEURO: Awake, nonverbal, minimal blink to threat, minimal movement of right arm EYES:  eyes equal and reactive ENT/NECK:  no LAD, no JVD CARDIO: Regular rate, well-perfused, normal S1 and S2 PULM:  CTAB no wheezing or rhonchi GI/GU:  normal bowel sounds, non-distended, non-tender MSK/SPINE:  No gross deformities, no edema SKIN:  no rash, atraumatic PSYCH: Unable to assess  Diagnostic and Interventional Summary    EKG Interpretation  Date/Time:  Thursday July 09 2019 20:05:21  EST Ventricular Rate:  70 PR Interval:    QRS Duration: 92 QT Interval:  419 QTC Calculation: 453 R Axis:   73 Text Interpretation: Sinus rhythm Borderline short PR interval Nonspecific T abnormalities, lateral leads Baseline wander in lead(s) I II aVR V3 V6 No previous ECGs available Confirmed by Gerlene Fee [phone]) on 07/09/2019 8:39:04 PM      Labs Reviewed  COMPREHENSIVE METABOLIC PANEL - Abnormal; Notable for the following components:      Result Value   CO2 21 (*)    Glucose, Bld 112 (*)    BUN 23 (*)    All other components within normal limits  I-STAT CHEM 8, ED - Abnormal; Notable for the following components:   BUN 24 (*)    Glucose, Bld 107 (*)    All other components within normal limits  CBG MONITORING, ED - Abnormal; Notable for the following components:   Glucose-Capillary 105 (*)    All other components within normal limits  PROTIME-INR  APTT  CBC  DIFFERENTIAL  HIV ANTIBODY (ROUTINE TESTING W REFLEX)  I-STAT BETA HCG BLOOD, ED ([facility], [facility], [facility] ONLY)    CT HEAD CODE STROKE WO CONTRAST  Final Result      Medications  sodium chloride flush (NS) 0.9 % injection 3 mL (has no administration in time range)  clevidipine (CLEVIPREX) infusion 0.5 mg/mL (12 mg/hr Intravenous Rate/Dose Change 07/09/19 2019)   stroke: mapping our early stages of recovery book (has no administration in time range)  acetaminophen (TYLENOL) tablet 650 mg (has no administration in time range)    Or  acetaminophen (TYLENOL) 160 MG/5ML solution 650 mg (has no administration in time range)    Or  acetaminophen (TYLENOL) suppository 650 mg (has no administration in time range)  senna-docusate (Senokot-S) tablet 1 tablet (has no administration in time range)  pantoprazole (PROTONIX) injection 40 mg (has no administration in time range)     Procedures  /  Critical Care .Critical Care Performed by: Maudie Flakes, MD Authorized by: Maudie Flakes, MD   Critical care provider statement:     Critical care time (minutes):  35   Critical care was necessary to treat or prevent imminent or life-threatening deterioration of the following conditions:  CNS failure or compromise (Intracranial hemorrhage)   Critical care was time spent personally by me on the following activities:  Discussions with consultants, evaluation of patient's response to treatment, examination of patient, ordering and performing treatments and interventions,  ordering and review of laboratory studies, ordering and review of radiographic studies, pulse oximetry, re-evaluation of patient's condition, obtaining history from patient or surrogate and review of old charts    ED Course and Medical Decision Making  I have reviewed the triage vital signs and the nursing notes.  Pertinent labs & imaging results that were available during my care of the patient were reviewed by me and considered in my medical decision making (see below for details).     Code stroke initiated prior to arrival, CT Noncon reveals hemorrhage.  Started on IV clevidipine, admitted to neuro ICU.  Patient is largely unresponsive but with eyes open, protecting airway.    Barth Kirks. Sedonia Small, [place] [email or web address]  Final Clinical Impressions(s) / ED Diagnoses     ICD-10-CM   1. Intracranial bleeding ([place])  I62.9     ED Discharge Orders    None       Discharge Instructions Discussed with and Provided to Patient:   Discharge Instructions   None       Maudie Flakes, MD 07/09/19 2040

## 2019-07-09 NOTE — H&P (Signed)
Neurology H&P  CC: Right-sided weakness  History is obtained from: Chart review  HPI: Melanie Madden is a 53 y.o. female who presents with right-sided weakness that started abruptly after returning home from work.  Her husband states that she got home around 6 PM, and was relatively normal at that time.  She subsequently stated that she was not feeling well and sat down.  Her mental status then progressively declined and he called 911.  En route, code stroke was activated due to right-sided hemiparesis and after arrival she was taken for CT scan which reveals a large basal ganglia hemorrhage on the left.   LKW: 6 PM tpa given?: No, ICH IR Thrombectomy? No, ICH Modified Rankin Scale: 0-Completely asymptomatic and back to baseline post- stroke   ROS:  Unable to obtain due to altered mental status.   Past Medical History:  Diagnosis Date  . Allergy   . Anxiety   . Arthritis   . High triglycerides   . Kidney stones   . Migraines   . Recurrent sinus infections      Family History  Problem Relation Age of Onset  . Alzheimer's disease Mother   . Hypertension Mother   . Diabetes Mother   . Thyroid disease Mother   . Deep vein thrombosis Maternal Grandmother   . Colon cancer Neg Hx   . Colon polyps Neg Hx   . Esophageal cancer Neg Hx   . Pancreatic cancer Neg Hx   . Rectal cancer Neg Hx   . Stomach cancer Neg Hx   . Breast cancer Neg Hx      Social History:  reports that she has quit smoking. She has never used smokeless tobacco. She reports current alcohol use. She reports that she does not use drugs.   Prior to Admission medications   Medication Sig Start Date End Date Taking? Authorizing Provider  acetaminophen (TYLENOL) 500 MG tablet Take 1 tablet (500 mg total) by mouth every 6 (six) hours as needed. 01/21/18   Law, Bea Graff, PA-C  B Complex-C (B-COMPLEX WITH VITAMIN C) tablet Take 1 tablet by mouth daily.    [provider]  BIOTIN PO Take by mouth.     [provider]  cetirizine (ZYRTEC) 10 MG tablet Take 10 mg by mouth daily.    [provider]  cholecalciferol (VITAMIN D) 1000 units tablet Take 1,000 Units by mouth daily. 2,000 units daily    [provider]  Cyanocobalamin (VITAMIN B-12 PO) Take by mouth.    [provider]  docusate sodium (COLACE) 100 MG capsule Take 100 mg by mouth daily.    [provider]  Flaxseed, Linseed, (FLAX SEEDS PO) Take 1 tablet by mouth daily.    [provider]  fluticasone (FLONASE) 50 MCG/ACT nasal spray Place 1 spray into both nostrils 2 (two) times daily.    [provider]  Ginkgo Biloba (GINKGO PO) Take by mouth.    [provider]  hydrOXYzine (ATARAX/VISTARIL) 25 MG tablet Take 1 tablet (25 mg total) by mouth 3 (three) times daily as needed. Patient not taking: Reported on 01/29/2018 01/17/15   Lyndal Pulley, DO  ibuprofen (ADVIL,MOTRIN) 600 MG tablet Take 1 tablet (600 mg total) by mouth every 6 (six) hours as needed. 01/21/18   Law, Bea Graff, PA-C  ipratropium (ATROVENT) 0.03 % nasal spray Place into the nose. 11/15/17   [provider]  magnesium oxide (MAG-OX) 400 (241.3 Mg) MG tablet Take 400 mg  by mouth daily.    [provider]  medroxyPROGESTERone (DEPO-PROVERA) 150 MG/ML injection Inject 150 mg into the muscle every 3 (three) months.  10/08/14   [provider]  methocarbamol (ROBAXIN) 500 MG tablet Take 1 tablet (500 mg total) by mouth every 8 (eight) hours as needed for muscle spasms. 07/17/18   Antonietta Breach, PA-C  naproxen (NAPROSYN) 500 MG tablet Take 1 tablet (500 mg total) by mouth every 12 (twelve) hours as needed for mild pain or moderate pain. 07/17/18   Antonietta Breach, PA-C  NON FORMULARY caltrate bone plus minerals 600plus d3    [provider]  Omega-3 Fatty Acids (FISH OIL) 1200 MG CPDR Take by mouth.    [provider]  Red Yeast Rice Extract (RED YEAST RICE PO) Take  by mouth.    [provider]  UNABLE TO FIND verisol collagen powder    [provider]  UNABLE TO FIND cbd oil    [provider]  VITAMIN E PO Take by mouth.    [provider]  zinc sulfate (ZINC-220) 220 (50 Zn) MG capsule Take 220 mg by mouth daily.    [provider]     Exam: Current vital signs: BP 134/78   Pulse (!) 104   Resp 18   Wt 68.4 kg   SpO2 100%   BMI 23.62 kg/m    Physical Exam  Constitutional: Appears well-developed and well-nourished.  Psych: Not very responsive Eyes: No scleral injection HENT: No OP obstrucion Head: Normocephalic.  Cardiovascular: Normal rate and regular rhythm.  Respiratory: Effort normal and breath sounds normal to anterior ascultation GI: Soft.  No distension. There is no tenderness.  Skin: WDI  Neuro: Mental Status: She is somnolent but arousable, she has no speech and does not follow commands. Cranial Nerves: II: She has a right hemianopia pupils are equal, round, and reactive to light.   III,IV, VI: Left gaze, does not cross midline to the right V: Responds some to stimuli on the right VII: Facial movement with left facial weakness VIII: hearing is intact to voice Motor: She has severe right hemiparesis, with right arm flexion to noxious stimulation, right leg minimal withdrawal.  She is briskly purposeful on the left Sensory: Response to noxious stimulation in all 4 extremities, more on the left than the right Cerebellar: Does not perform  I have reviewed labs in epic and the pertinent results are: CMP-unremarkable CBC-normal  PTT, INR-normal  I have reviewed the images obtained: CT head-large basal ganglia hemorrhage on the left.  Primary Diagnosis:  Nontraumatic intracerebral hemorrhage in hemisphere, subcortical  Secondary Diagnosis: Hypertension Emergency (SBP > 180 or DBP > 120 & end organ damage)   Impression: 54 year old female with intraparenchymal hemorrhage  that is likely due to hypertension.  Currently she is protecting her airway, but given her degree of somnolence I did discuss that there may be a potential for needing a breathing to before the night is done with her husband.  I discussed with neurosurgery, no need for urgent surgical intervention.  Plan: 1) Admit to ICU 2) no antiplatelets or anticoagulants 3) blood pressure control with goal systolic [id number] 4) Frequent neuro checks 5) If symptoms worsen or there is decreased mental status, repeat stat head CT 6) PT,OT,ST   This patient is critically ill and at significant risk of neurological worsening, death and care requires constant monitoring of vital signs, hemodynamics,respiratory and cardiac monitoring, neurological assessment, discussion with family, other specialists  and medical decision making of high complexity. I spent 45 minutes of neurocritical care time  in the care of  this patient. This was time spent independent of any time provided by nurse practitioner or PA.  Roland Rack, MD [facility] [phone]  If 7pm- 7am, please page neurology on call as listed in [place].

## 2019-07-09 NOTE — ED Triage Notes (Signed)
Pt bib [facility] as code stroke. LKW 1700. Pt non-verbal, flacid on R side.

## 2019-07-09 NOTE — ED Notes (Signed)
ED TO INPATIENT HANDOFF REPORT  ED Nurse Name and Phone #: Teea Ducey [phone]  S Name/Age/Gender Melanie Madden 53 y.o. female Room/Bed: 035C/035C  Code Status   Code Status: Full Code  Home/SNF/Other Home unresponsive Is this baseline? No   Triage Complete: Triage complete  Chief Complaint ICH (intracerebral hemorrhage) ([place]) [I61.9]  Triage Note Pt bib gcems as code stroke. LKW 1700. Pt non-verbal, flacid on R side.     Allergies Allergies  Allergen Reactions  . Amoxicillin Nausea Only  . Shellfish Allergy Diarrhea and Nausea And Vomiting    Mussels     Level of Care/Admitting Diagnosis ED Disposition    ED Disposition Condition [facility]: [place] [[id number]]  Level of Care: ICU [6]  Covid Evaluation: Asymptomatic Screening Protocol (No Symptoms)  Diagnosis: ICH (intracerebral hemorrhage) ([place]) [id number]  Admitting Physician: Leonel Ramsay, MCNEILL P [[id number]]  Attending Physician: Leonel Ramsay, MCNEILL P [[id number]]  Estimated length of stay: past midnight tomorrow  Certification:: I certify this patient will need inpatient services for at least 2 midnights       B Medical/Surgery History Past Medical History:  Diagnosis Date  . Allergy   . Anxiety   . Arthritis   . High triglycerides   . Kidney stones   . Migraines   . Recurrent sinus infections    Past Surgical History:  Procedure Laterality Date  . ANTERIOR CRUCIATE LIGAMENT REPAIR Left   . BREAST SURGERY     breast reduction  . BUNIONECTOMY Right   . LASIK    . LITHOTRIPSY    . MENISCUS REPAIR Left   . NASAL SEPTUM SURGERY    . REDUCTION MAMMAPLASTY     bilateral  . SHOULDER SURGERY    . VARICOSE VEIN SURGERY Left      A IV Location/Drains/Wounds Patient Lines/Drains/Airways Status   Active Line/Drains/Airways    Name:   Placement date:   Placement time:   Site:   Days:   Peripheral IV 07/09/19 Left Wrist   07/09/19    1957    Wrist   less than 1   Peripheral  IV 07/09/19 Left Antecubital   07/09/19    2022    Antecubital   less than 1          Intake/Output Last 24 hours No intake or output data in the 24 hours ending 07/09/19 2232  Labs/Imaging Results for orders placed or performed during the hospital encounter of 07/09/19 (from the past 48 hour(s))  CBG monitoring, ED     Status: Abnormal   Collection Time: 07/09/19  7:48 PM  Result Value Ref Range   Glucose-Capillary 105 (H) 70 - 99 mg/dL  Protime-INR     Status: None   Collection Time: 07/09/19  7:49 PM  Result Value Ref Range   Prothrombin Time 12.9 11.4 - 15.2 seconds   INR 1.0 0.8 - 1.2    Comment: (NOTE) INR goal varies based on device and disease states. Performed at [facility], [place] [address], [place], [place] [address]   APTT     Status: None   Collection Time: 07/09/19  7:49 PM  Result Value Ref Range   aPTT 29 24 - 36 seconds    Comment: Performed at [facility] [address], [place], [place] [address]  CBC     Status: None   Collection Time: 07/09/19  7:49 PM  Result Value Ref Range   WBC 8.1  4.0 - 10.5 K/uL   RBC 4.67 3.87 - 5.11 MIL/uL   Hemoglobin 14.3 12.0 - 15.0 g/dL   HCT 41.6 36.0 - 46.0 %   MCV 89.1 80.0 - 100.0 fL   MCH 30.6 26.0 - 34.0 pg   MCHC 34.4 30.0 - 36.0 g/dL   RDW 13.1 11.5 - 15.5 %   Platelets 191 150 - 400 K/uL   nRBC 0.0 0.0 - 0.2 %    Comment: Performed at [facility], [place] [address], [place], [place] [address]  Differential     Status: None   Collection Time: 07/09/19  7:49 PM  Result Value Ref Range   Neutrophils Relative % 51 %   Neutro Abs 4.1 1.7 - 7.7 K/uL   Lymphocytes Relative 39 %   Lymphs Abs 3.2 0.7 - 4.0 K/uL   Monocytes Relative 9 %   Monocytes Absolute 0.8 0.1 - 1.0 K/uL   Eosinophils Relative 1 %   Eosinophils Absolute 0.1 0.0 - 0.5 K/uL   Basophils Relative 0 %   Basophils Absolute 0.0 0.0 - 0.1 K/uL   Immature Granulocytes 0 %   Abs Immature Granulocytes 0.01 0.00 - 0.07 K/uL     Comment: Performed at [facility] [address] [place], [place] [address]  Comprehensive metabolic panel     Status: Abnormal   Collection Time: 07/09/19  7:49 PM  Result Value Ref Range   Sodium 137 135 - 145 mmol/L   Potassium 3.8 3.5 - 5.1 mmol/L   Chloride 106 98 - 111 mmol/L   CO2 21 (L) 22 - 32 mmol/L   Glucose, Bld 112 (H) 70 - 99 mg/dL   BUN 23 (H) 6 - 20 mg/dL   Creatinine, Ser 0.92 0.44 - 1.00 mg/dL   Calcium 10.2 8.9 - 10.3 mg/dL   Total Protein 6.6 6.5 - 8.1 g/dL   Albumin 3.8 3.5 - 5.0 g/dL   AST 21 15 - 41 U/L   ALT 25 0 - 44 U/L   Alkaline Phosphatase 49 38 - 126 U/L   Total Bilirubin 0.9 0.3 - 1.2 mg/dL   GFR calc non Af Amer >60 >60 mL/min   GFR calc Af Amer >60 >60 mL/min   Anion gap 10 5 - 15    Comment: Performed at [facility], [place] [address] [place], [place] [address]  I-Stat beta hCG blood, ED     Status: None   Collection Time: 07/09/19  7:54 PM  Result Value Ref Range   I-stat hCG, quantitative <5.0 <5 mIU/mL   Comment 3            Comment:   GEST. AGE      CONC.  (mIU/mL)   <=1 WEEK        5 - 50     2 WEEKS       50 - 500     3 WEEKS       100 - 10,000     4 WEEKS     1,000 - 30,000        FEMALE AND NON-PREGNANT FEMALE:     LESS THAN 5 mIU/mL   I-stat chem 8, ED     Status: Abnormal   Collection Time: 07/09/19  7:56 PM  Result Value Ref Range   Sodium 138 135 - 145 mmol/L   Potassium 3.8 3.5 - 5.1 mmol/L   Chloride 107 98 - 111 mmol/L   BUN  24 (H) 6 - 20 mg/dL   Creatinine, Ser 0.90 0.44 - 1.00 mg/dL   Glucose, Bld 107 (H) 70 - 99 mg/dL   Calcium, Ion 1.21 1.15 - 1.40 mmol/L   TCO2 24 22 - 32 mmol/L   Hemoglobin 13.9 12.0 - 15.0 g/dL   HCT 41.0 36.0 - 46.0 %   CT HEAD CODE STROKE WO CONTRAST  Result Date: 07/09/2019 CLINICAL DATA:  Code stroke. Leftward gaze with right-sided deficits EXAM: CT HEAD WITHOUT CONTRAST TECHNIQUE: Contiguous axial images were obtained from the base of the skull through the vertex without  intravenous contrast. COMPARISON:  Head CT 01/21/2018 FINDINGS: Brain: There is an intraparenchymal hematoma centered in the left lentiform nucleus measuring 5.3 cm AP x 4.1 cm TV x 3.9 cm CC (volume = 44 cm^3). There is mass effect on the left lateral ventricle without hydrocephalus. There is 5 mm of rightward midline shift without frank subfalcine herniation. Vascular: No hyperdense vessel or unexpected calcification. Skull: Normal. Negative for fracture or focal lesion. Sinuses/Orbits: No acute finding. Other: None. ASPECTS [facility] Stroke Program Early CT Score) is not applicable in the setting of acute hemorrhage. IMPRESSION: 1. Intraparenchymal hematoma with volume of 44 cc centered in the left basal ganglia, most consistent with hypertensive hemorrhage. 2. 5 mm rightward midline shift. Critical Value/emergent results were called by telephone at the time of interpretation on 07/09/2019 at 8:05 pm to [place] , who verbally acknowledged these results. Electronically Signed   By: Ulyses Jarred M.D.   On: 07/09/2019 20:07    Pending Labs Unresulted Labs (From admission, onward)    Start     Ordered   07/09/19 2001  HIV Antibody (routine testing w rflx)  (HIV Antibody (Routine testing w reflex) panel)  Once,   STAT     07/09/19 2001          Vitals/Pain Today's Vitals   07/09/19 2145 07/09/19 2200 07/09/19 2215 07/09/19 2230  BP: 131/89 136/73 136/79 133/75  Pulse: (!) 107 93 (!) 103 93  Resp: 20 15 12 15   SpO2: 100% 100% 100% 98%  Weight:      PainSc:        Isolation Precautions No active isolations  Medications Medications  sodium chloride flush (NS) 0.9 % injection 3 mL (has no administration in time range)  clevidipine (CLEVIPREX) infusion 0.5 mg/mL (14 mg/hr Intravenous New Bag/Given 07/09/19 2143)   stroke: mapping our early stages of recovery book (has no administration in time range)  acetaminophen (TYLENOL) tablet 650 mg (has no administration in time range)     Or  acetaminophen (TYLENOL) 160 MG/5ML solution 650 mg (has no administration in time range)    Or  acetaminophen (TYLENOL) suppository 650 mg (has no administration in time range)  senna-docusate (Senokot-S) tablet 1 tablet (has no administration in time range)  pantoprazole (PROTONIX) injection 40 mg (has no administration in time range)    Mobility non-ambulatory High fall risk   Focused Assessments Neuro Assessment Handoff:  Swallow screen pass? No    NIH Stroke Scale ( + Modified Stroke Scale Criteria)  Interval: Shift assessment Level of Consciousness (1a.)   : Not alert, requires repeated stimulation to attend, or is obtunded and requires strong or painful stimulation to make movements (not stereotyped) LOC Questions (1b. )   +: Answers both questions correctly LOC Commands (1c. )   + : Performs neither task correctly Best Gaze (2. )  +: Partial gaze palsy Visual (3. )  +:  Complete hemianopia Facial Palsy (4. )    : Minor paralysis Motor Arm, Left (5a. )   +: No drift Motor Arm, Right (5b. )   +: No effort against gravity Motor Leg, Left (6a. )   +: No drift Motor Leg, Right (6b. )   +: No movement Limb Ataxia (7. ): Absent Sensory (8. )   +: Mild-to-moderate sensory loss, patient feels pinprick is less sharp or is dull on the affected side, or there is a loss of superficial pain with pinprick, but patient is aware of being touched Best Language (9. )   +: Mute, global aphasia Dysarthria (10. ): Severe dysarthria, patient's speech is so slurred as to be unintelligible in the absence of or out of proportion to any dysphasia, or is mute/anarthric Extinction/Inattention (11.)   +: No Abnormality Modified SS Total  +: 16 Complete NIHSS TOTAL: 21 Last date known well: 07/09/19 Last time known well: 1700 Neuro Assessment: Exceptions to WDL Neuro Checks:   Initial (07/09/19 1955)  Last Documented NIHSS Modified Score: 16 (07/09/19 2005) Has TPA been given? No If patient  is a Neuro Trauma and patient is going to OR before floor call report to [place] nurse: [phone] or [phone]     R Recommendations: See Admitting Provider Note  Report given to:   Additional Notes:

## 2019-07-10 ENCOUNTER — Inpatient Hospital Stay (HOSPITAL_COMMUNITY): Payer: Managed Care, Other (non HMO)

## 2019-07-10 DIAGNOSIS — I6389 Other cerebral infarction: Secondary | ICD-10-CM

## 2019-07-10 DIAGNOSIS — G936 Cerebral edema: Secondary | ICD-10-CM

## 2019-07-10 LAB — RAPID URINE DRUG SCREEN, HOSP PERFORMED
Amphetamines: NOT DETECTED
Barbiturates: NOT DETECTED
Benzodiazepines: NOT DETECTED
Cocaine: NOT DETECTED
Opiates: NOT DETECTED
Tetrahydrocannabinol: NOT DETECTED

## 2019-07-10 LAB — LIPID PANEL
Cholesterol: 265 mg/dL — ABNORMAL HIGH (ref 0–200)
HDL: 81 mg/dL (ref >40)
LDL Cholesterol: 156 mg/dL — ABNORMAL HIGH (ref 0–99)
Total CHOL/HDL Ratio: 3.3 RATIO
Triglycerides: 142 mg/dL (ref <150)
VLDL: 28 mg/dL (ref 0–40)

## 2019-07-10 LAB — HEMOGLOBIN A1C
Hgb A1c MFr Bld: 5.3 % (ref 4.8–5.6)
Mean Plasma Glucose: 105.41 mg/dL

## 2019-07-10 LAB — SODIUM
Sodium: 138 mmol/L (ref 135–145)
Sodium: 144 mmol/L (ref 135–145)

## 2019-07-10 LAB — RESPIRATORY PANEL BY RT PCR (FLU A&B, COVID)
Influenza A by PCR: NEGATIVE
Influenza B by PCR: NEGATIVE
SARS Coronavirus 2 by RT PCR: NEGATIVE

## 2019-07-10 LAB — HIV ANTIBODY (ROUTINE TESTING W REFLEX): HIV Screen 4th Generation wRfx: NONREACTIVE

## 2019-07-10 LAB — ECHOCARDIOGRAM COMPLETE: Weight: 2412.71 oz

## 2019-07-10 LAB — MRSA PCR SCREENING: MRSA by PCR: NEGATIVE

## 2019-07-10 LAB — SARS CORONAVIRUS 2 (TAT 6-24 HRS): SARS Coronavirus 2: NEGATIVE

## 2019-07-10 LAB — GLUCOSE, CAPILLARY: Glucose-Capillary: 144 mg/dL — ABNORMAL HIGH (ref 70–99)

## 2019-07-10 MED ORDER — SODIUM CHLORIDE 3 % IV SOLN
INTRAVENOUS | Status: AC
  Administered 2019-07-10 – 2019-07-11 (×3): 75 mL/h via INTRAVENOUS
  Filled 2019-07-10 (×5): qty 500

## 2019-07-10 MED ORDER — LABETALOL HCL 5 MG/ML IV SOLN
20.0000 mg | INTRAVENOUS | Status: DC | PRN
  Administered 2019-07-10 – 2019-07-11 (×3): 20 mg via INTRAVENOUS
  Filled 2019-07-10 (×3): qty 4

## 2019-07-10 MED ORDER — HYDRALAZINE HCL 20 MG/ML IJ SOLN
20.0000 mg | Freq: Three times a day (TID) | INTRAMUSCULAR | Status: DC | PRN
  Administered 2019-07-10: 20 mg via INTRAVENOUS
  Filled 2019-07-10: qty 1

## 2019-07-10 MED ORDER — CHLORHEXIDINE GLUCONATE CLOTH 2 % EX PADS
6.0000 | MEDICATED_PAD | Freq: Every day | CUTANEOUS | Status: DC
  Administered 2019-07-10 – 2019-07-18 (×9): 6 via TOPICAL

## 2019-07-10 NOTE — Consult Note (Signed)
Reason for Consult: Left basal ganglia hemorrhage Referring Physician: Dr. Atilano Median Melanie Madden is an 53 y.o. female.  HPI: The patient is a 53 year old white female who became right densely hemiparetic and injuries mental status yesterday.  She was brought to [facility].  A head CT was obtained which demonstrated a left basal ganglia hemorrhage.  She was evaluated by neurology, Dr. Leonel Ramsay.  He contacted me and I reviewed the CAT scan and did not recommend surgery.  Presently the patient is mildly somnolent but easily arousable.  She is very purposeful but does not follow commands.  Past Medical History:  Diagnosis Date  . Allergy   . Anxiety   . Arthritis   . High triglycerides   . Kidney stones   . Migraines   . Recurrent sinus infections     Past Surgical History:  Procedure Laterality Date  . ANTERIOR CRUCIATE LIGAMENT REPAIR Left   . BREAST SURGERY     breast reduction  . BUNIONECTOMY Right   . LASIK    . LITHOTRIPSY    . MENISCUS REPAIR Left   . NASAL SEPTUM SURGERY    . REDUCTION MAMMAPLASTY     bilateral  . SHOULDER SURGERY    . VARICOSE VEIN SURGERY Left     Family History  Problem Relation Age of Onset  . Alzheimer's disease Mother   . Hypertension Mother   . Diabetes Mother   . Thyroid disease Mother   . Deep vein thrombosis Maternal Grandmother   . Colon cancer Neg Hx   . Colon polyps Neg Hx   . Esophageal cancer Neg Hx   . Pancreatic cancer Neg Hx   . Rectal cancer Neg Hx   . Stomach cancer Neg Hx   . Breast cancer Neg Hx     Social History:  reports that she has quit smoking. She has never used smokeless tobacco. She reports current alcohol use. She reports that she does not use drugs.  Allergies:  Allergies  Allergen Reactions  . Amoxicillin Nausea Only  . Shellfish Allergy Diarrhea and Nausea And Vomiting    Mussels     Medications:  I have reviewed the patient's current medications. Prior to Admission:   Facility-Administered Medications Prior to Admission  Medication Dose Route Frequency Provider Last Rate Last Admin  . 0.9 %  sodium chloride infusion  500 mL Intravenous Continuous Nandigam, Kavitha V, MD       Medications Prior to Admission  Medication Sig Dispense Refill Last Dose  . acetaminophen (TYLENOL) 500 MG tablet Take 1 tablet (500 mg total) by mouth every 6 (six) hours as needed. 30 tablet 0   . B Complex-C (B-COMPLEX WITH VITAMIN C) tablet Take 1 tablet by mouth daily.     Marland Kitchen BIOTIN PO Take by mouth.     . cetirizine (ZYRTEC) 10 MG tablet Take 10 mg by mouth daily.     . cholecalciferol (VITAMIN D) 1000 units tablet Take 1,000 Units by mouth daily. 2,000 units daily     . Cyanocobalamin (VITAMIN B-12 PO) Take by mouth.     . docusate sodium (COLACE) 100 MG capsule Take 100 mg by mouth daily.     . Flaxseed, Linseed, (FLAX SEEDS PO) Take 1 tablet by mouth daily.     . fluticasone (FLONASE) 50 MCG/ACT nasal spray Place 1 spray into both nostrils 2 (two) times daily.     . Ginkgo Biloba (GINKGO PO) Take by mouth.     Marland Kitchen  hydrOXYzine (ATARAX/VISTARIL) 25 MG tablet Take 1 tablet (25 mg total) by mouth 3 (three) times daily as needed. (Patient not taking: Reported on 01/29/2018) 90 tablet 1   . ibuprofen (ADVIL,MOTRIN) 600 MG tablet Take 1 tablet (600 mg total) by mouth every 6 (six) hours as needed. 30 tablet 0   . ipratropium (ATROVENT) 0.03 % nasal spray Place into the nose.     . magnesium oxide (MAG-OX) 400 (241.3 Mg) MG tablet Take 400 mg by mouth daily.     . medroxyPROGESTERone (DEPO-PROVERA) 150 MG/ML injection Inject 150 mg into the muscle every 3 (three) months.      . methocarbamol (ROBAXIN) 500 MG tablet Take 1 tablet (500 mg total) by mouth every 8 (eight) hours as needed for muscle spasms. 20 tablet 0   . naproxen (NAPROSYN) 500 MG tablet Take 1 tablet (500 mg total) by mouth every 12 (twelve) hours as needed for mild pain or moderate pain. 30 tablet 0   . NON FORMULARY  caltrate bone plus minerals 600plus d3     . Omega-3 Fatty Acids (FISH OIL) 1200 MG CPDR Take by mouth.     . Red Yeast Rice Extract (RED YEAST RICE PO) Take by mouth.     Marland Kitchen UNABLE TO FIND verisol collagen powder     . UNABLE TO FIND cbd oil     . VITAMIN E PO Take by mouth.     . zinc sulfate (ZINC-220) 220 (50 Zn) MG capsule Take 220 mg by mouth daily.      Scheduled: . pantoprazole (PROTONIX) IV  40 mg Intravenous QHS  . senna-docusate  1 tablet Oral BID   Continuous: . clevidipine 12 mg/hr (07/10/19 0600)   [id number] **OR** acetaminophen (TYLENOL) oral liquid 160 mg/5 mL **OR** acetaminophen Anti-infectives (From admission, onward)   None       Results for orders placed or performed during the hospital encounter of 07/09/19 (from the past 48 hour(s))  CBG monitoring, ED     Status: Abnormal   Collection Time: 07/09/19  7:48 PM  Result Value Ref Range   Glucose-Capillary 105 (H) 70 - 99 mg/dL  Protime-INR     Status: None   Collection Time: 07/09/19  7:49 PM  Result Value Ref Range   Prothrombin Time 12.9 11.4 - 15.2 seconds   INR 1.0 0.8 - 1.2    Comment: (NOTE) INR goal varies based on device and disease states. Performed at [facility], [place] [address], [place], [place] [address]   APTT     Status: None   Collection Time: 07/09/19  7:49 PM  Result Value Ref Range   aPTT 29 24 - 36 seconds    Comment: Performed at [place] [address], [place] [address]  CBC     Status: None   Collection Time: 07/09/19  7:49 PM  Result Value Ref Range   WBC 8.1 4.0 - 10.5 K/uL   RBC 4.67 3.87 - 5.11 MIL/uL   Hemoglobin 14.3 12.0 - 15.0 g/dL   HCT 41.6 36.0 - 46.0 %   MCV 89.1 80.0 - 100.0 fL   MCH 30.6 26.0 - 34.0 pg   MCHC 34.4 30.0 - 36.0 g/dL   RDW 13.1 11.5 - 15.5 %   Platelets 191 150 - 400 K/uL   nRBC 0.0 0.0 - 0.2 %    Comment: Performed at [facility], [place] [address], [place], [place] [address]  Differential  Status:  None   Collection Time: 07/09/19  7:49 PM  Result Value Ref Range   Neutrophils Relative % 51 %   Neutro Abs 4.1 1.7 - 7.7 K/uL   Lymphocytes Relative 39 %   Lymphs Abs 3.2 0.7 - 4.0 K/uL   Monocytes Relative 9 %   Monocytes Absolute 0.8 0.1 - 1.0 K/uL   Eosinophils Relative 1 %   Eosinophils Absolute 0.1 0.0 - 0.5 K/uL   Basophils Relative 0 %   Basophils Absolute 0.0 0.0 - 0.1 K/uL   Immature Granulocytes 0 %   Abs Immature Granulocytes 0.01 0.00 - 0.07 K/uL    Comment: Performed at [facility] [address], [place], [place] [address]  Comprehensive metabolic panel     Status: Abnormal   Collection Time: 07/09/19  7:49 PM  Result Value Ref Range   Sodium 137 135 - 145 mmol/L   Potassium 3.8 3.5 - 5.1 mmol/L   Chloride 106 98 - 111 mmol/L   CO2 21 (L) 22 - 32 mmol/L   Glucose, Bld 112 (H) 70 - 99 mg/dL   BUN 23 (H) 6 - 20 mg/dL   Creatinine, Ser 0.92 0.44 - 1.00 mg/dL   Calcium 10.2 8.9 - 10.3 mg/dL   Total Protein 6.6 6.5 - 8.1 g/dL   Albumin 3.8 3.5 - 5.0 g/dL   AST 21 15 - 41 U/L   ALT 25 0 - 44 U/L   Alkaline Phosphatase 49 38 - 126 U/L   Total Bilirubin 0.9 0.3 - 1.2 mg/dL   GFR calc non Af Amer >60 >60 mL/min   GFR calc Af Amer >60 >60 mL/min   Anion gap 10 5 - 15    Comment: Performed at [facility], [place] [address] [place], [place] [address]  I-Stat beta hCG blood, ED     Status: None   Collection Time: 07/09/19  7:54 PM  Result Value Ref Range   I-stat hCG, quantitative <5.0 <5 mIU/mL   Comment 3            Comment:   GEST. AGE      CONC.  (mIU/mL)   <=1 WEEK        5 - 50     2 WEEKS       50 - 500     3 WEEKS       100 - 10,000     4 WEEKS     1,000 - 30,000        FEMALE AND NON-PREGNANT FEMALE:     LESS THAN 5 mIU/mL   I-stat chem 8, ED     Status: Abnormal   Collection Time: 07/09/19  7:56 PM  Result Value Ref Range   Sodium 138 135 - 145 mmol/L   Potassium 3.8 3.5 - 5.1 mmol/L   Chloride 107 98 - 111 mmol/L   BUN 24 (H) 6 - 20  mg/dL   Creatinine, Ser 0.90 0.44 - 1.00 mg/dL   Glucose, Bld 107 (H) 70 - 99 mg/dL   Calcium, Ion 1.21 1.15 - 1.40 mmol/L   TCO2 24 22 - 32 mmol/L   Hemoglobin 13.9 12.0 - 15.0 g/dL   HCT 41.0 36.0 - 46.0 %  SARS CORONAVIRUS 2 (TAT 6-24 HRS) Nasopharyngeal Nasopharyngeal Swab     Status: None   Collection Time: 07/09/19 10:42 PM   Specimen: Nasopharyngeal Swab  Result Value Ref Range   SARS Coronavirus 2 NEGATIVE NEGATIVE  Comment: (NOTE) SARS-CoV-2 target nucleic acids are NOT DETECTED. The SARS-CoV-2 RNA is generally detectable in upper and lower respiratory specimens during the acute phase of infection. Negative results do not preclude SARS-CoV-2 infection, do not rule out co-infections with other pathogens, and should not be used as the sole basis for treatment or other patient management decisions. Negative results must be combined with clinical observations, patient history, and epidemiological information. The expected result is Negative. Fact Sheet for Patients: SugarRoll.be Fact Sheet for Healthcare Providers: https://www.woods-mathews.com/ This test is not yet approved or cleared by the [place] FDA and  has been authorized for detection and/or diagnosis of SARS-CoV-2 by FDA under an Emergency Use Authorization (EUA). This EUA will remain  in effect (meaning this test can be used) for the duration of the COVID-19 declaration under Section 56 4(b)(1) of the Act, 21 U.S.C. section 360bbb-3(b)(1), unless the authorization is terminated or revoked sooner. Performed at [facility], [place] [address] [place], [place] [address]   Respiratory Panel by RT PCR (Flu A&B, Covid) - Nasopharyngeal Swab     Status: None   Collection Time: 07/09/19 11:47 PM   Specimen: Nasopharyngeal Swab  Result Value Ref Range   SARS Coronavirus 2 by RT PCR NEGATIVE NEGATIVE    Comment: (NOTE) SARS-CoV-2 target nucleic acids are NOT  DETECTED. The SARS-CoV-2 RNA is generally detectable in upper respiratoy specimens during the acute phase of infection. The lowest concentration of SARS-CoV-2 viral copies this assay can detect is 131 copies/mL. A negative result does not preclude SARS-Cov-2 infection and should not be used as the sole basis for treatment or other patient management decisions. A negative result may occur with  improper specimen collection/handling, submission of specimen other than nasopharyngeal swab, presence of viral mutation(s) within the areas targeted by this assay, and inadequate number of viral copies (<131 copies/mL). A negative result must be combined with clinical observations, patient history, and epidemiological information. The expected result is Negative. Fact Sheet for Patients:  PinkCheek.be Fact Sheet for Healthcare Providers:  GravelBags.it This test is not yet ap proved or cleared by the [place] FDA and  has been authorized for detection and/or diagnosis of SARS-CoV-2 by FDA under an Emergency Use Authorization (EUA). This EUA will remain  in effect (meaning this test can be used) for the duration of the COVID-19 declaration under Section 564(b)(1) of the Act, 21 U.S.C. section 360bbb-3(b)(1), unless the authorization is terminated or revoked sooner.    Influenza A by PCR NEGATIVE NEGATIVE   Influenza B by PCR NEGATIVE NEGATIVE    Comment: (NOTE) The Xpert Xpress SARS-CoV-2/FLU/RSV assay is intended as an aid in  the diagnosis of influenza from Nasopharyngeal swab specimens and  should not be used as a sole basis for treatment. Nasal washings and  aspirates are unacceptable for Xpert Xpress SARS-CoV-2/FLU/RSV  testing. Fact Sheet for Patients: PinkCheek.be Fact Sheet for Healthcare Providers: GravelBags.it This test is not yet approved or cleared by the  [place] FDA and  has been authorized for detection and/or diagnosis of SARS-CoV-2 by  FDA under an Emergency Use Authorization (EUA). This EUA will remain  in effect (meaning this test can be used) for the duration of the  Covid-19 declaration under Section 564(b)(1) of the Act, 21  U.S.C. section 360bbb-3(b)(1), unless the authorization is  terminated or revoked. Performed at [facility], [place] [address], [place], [place] [address]   MRSA PCR Screening     Status: None  Collection Time: 07/09/19 11:59 PM  Result Value Ref Range   MRSA by PCR NEGATIVE NEGATIVE    Comment:        The GeneXpert MRSA Assay (FDA approved for NASAL specimens only), is one component of a comprehensive MRSA colonization surveillance program. It is not intended to diagnose MRSA infection nor to guide or monitor treatment for MRSA infections. Performed at [facility], [place] [address], [place], [place] [address]   Glucose, capillary     Status: Abnormal   Collection Time: 07/10/19 12:43 AM  Result Value Ref Range   Glucose-Capillary 144 (H) 70 - 99 mg/dL    CT HEAD CODE STROKE WO CONTRAST  Result Date: 07/09/2019 CLINICAL DATA:  Code stroke. Leftward gaze with right-sided deficits EXAM: CT HEAD WITHOUT CONTRAST TECHNIQUE: Contiguous axial images were obtained from the base of the skull through the vertex without intravenous contrast. COMPARISON:  Head CT 01/21/2018 FINDINGS: Brain: There is an intraparenchymal hematoma centered in the left lentiform nucleus measuring 5.3 cm AP x 4.1 cm TV x 3.9 cm CC (volume = 44 cm^3). There is mass effect on the left lateral ventricle without hydrocephalus. There is 5 mm of rightward midline shift without frank subfalcine herniation. Vascular: No hyperdense vessel or unexpected calcification. Skull: Normal. Negative for fracture or focal lesion. Sinuses/Orbits: No acute finding. Other: None. ASPECTS [facility] Stroke Program Early CT Score) is not  applicable in the setting of acute hemorrhage. IMPRESSION: 1. Intraparenchymal hematoma with volume of 44 cc centered in the left basal ganglia, most consistent with hypertensive hemorrhage. 2. 5 mm rightward midline shift. Critical Value/emergent results were called by telephone at the time of interpretation on 07/09/2019 at 8:05 pm to [place] , who verbally acknowledged these results. Electronically Signed   By: Ulyses Jarred M.D.   On: 07/09/2019 20:07    ROS Blood pressure 124/75, pulse 88, temperature 98.1 F (36.7 C), temperature source Axillary, resp. rate 16, weight 68.4 kg, SpO2 100 %. Estimated body mass index is 23.62 kg/m as calculated from the following:   Height as of 07/17/18: 5\' 7"  (1.702 m).   Weight as of this encounter: 68.4 kg.  Physical Exam  General: A 53 year old white female in no apparent distress  HEENT: Normocephalic, atraumatic, pupils are small bilaterally and equal.  She has conjugate gaze.  She has right-sided neglect.  Neck: Unremarkable  Thorax: Symmetric  Abdomen: Soft  Extremities: Unremarkable  Neurologic exam: The patient is somnolent but easily arousable.  She has right-sided neglect.  She appears aphasic.  Glasgow Coma Scale [id number].  The patient is very purposeful on the left.  She is densely right hemiparetic.  Cranial nerve exam is limited but her pupils are equal as above.  She has conjugate gaze.  I have reviewed the patient's head CT performed yesterday.  It demonstrates a left basal ganglia hemorrhage with some mass-effect.  There is no intraventricular hemorrhage or hydrocephalus.  Assessment/Plan: Basal ganglia hemorrhage: I have discussed the situation with Dr. Leonel Ramsay.  Generally surgery is not beneficial with a hemorrhage in this location.  There is no indication for ventriculostomy presently.  Melanie Madden 07/10/2019, 7:34 AM

## 2019-07-10 NOTE — Progress Notes (Signed)
PT Cancellation Note  Patient Details Name: Melanie Madden MRN: [id number] DOB: 03-Jul-1966   Cancelled Treatment:    Reason Eval/Treat Not Completed: Active bedrest order.    [place] 07/10/2019, 3:15 PM [place] Pager [phone] Office [phone]

## 2019-07-10 NOTE — Progress Notes (Signed)
  Echocardiogram 2D Echocardiogram has been performed.  Melanie Madden 07/10/2019, 2:06 PM

## 2019-07-10 NOTE — Progress Notes (Signed)
STROKE TEAM PROGRESS NOTE   HISTORY OF PRESENT ILLNESS (per record) Melanie Madden is a 53 y.o. female who presents with right-sided weakness that started abruptly after returning home from work.  Her husband states that she got home around 6 PM, and was relatively normal at that time. She subsequently stated that she was not feeling well and sat down. Her mental status then progressively declined and he called 911.  En route, code stroke was activated due to right-sided hemiparesis and after arrival she was taken for CT scan which reveals a large basal ganglia hemorrhage on the left.  LKW: 6 PM tpa given?: No, ICH IR Thrombectomy? No, ICH Modified Rankin Scale: 0-Completely asymptomatic and back to baseline post- stroke   INTERVAL HISTORY I have personally reviewed history of presenting illness, electronic medical records and imaging films in PACS.  The patient's husband arrived later during rounds and spoke to them as well.  She remains awake aphasic with dense right hemiplegia.  Blood pressure is adequately controlled on Cardene drip.  Follow-up brain imaging is pending.  Patient apparently had a history of mild hypertension which she calls white coat syndrome and was not on medications regularly.  Neurosurgery has seen the patient and felt she is not a candidate for surgical intervention at the present time.   OBJECTIVE Vitals:   07/10/19 1230 07/10/19 1245 07/10/19 1300 07/10/19 1315  BP: 126/76 132/76 129/76 132/81  Pulse: 76 81 78 84  Resp: 16 16 16 16   Temp:      TempSrc:      SpO2: 99% 99% 99% 100%  Weight:        CBC:  Recent Labs  Lab 07/09/19 1949 07/09/19 1956  WBC 8.1  --   NEUTROABS 4.1  --   HGB 14.3 13.9  HCT 41.6 41.0  MCV 89.1  --   PLT 191  --     Basic Metabolic Panel:  Recent Labs  Lab 07/09/19 1949 07/09/19 1956 07/10/19 1145  NA 137 138 138  K 3.8 3.8  --   CL 106 107  --   CO2 21*  --   --   GLUCOSE 112* 107*  --   BUN 23* 24*  --    CREATININE 0.92 0.90  --   CALCIUM 10.2  --   --     Lipid Panel:     Component Value Date/Time   CHOL 265 (H) 07/10/2019 1145   TRIG 142 07/10/2019 1145   HDL 81 07/10/2019 1145   CHOLHDL 3.3 07/10/2019 1145   VLDL 28 07/10/2019 1145   LDLCALC 156 (H) 07/10/2019 1145   HgbA1c:  Lab Results  Component Value Date   HGBA1C 5.3 07/10/2019   Urine Drug Screen:     Component Value Date/Time   LABOPIA NONE DETECTED 07/10/2019 1126   COCAINSCRNUR NONE DETECTED 07/10/2019 1126   LABBENZ NONE DETECTED 07/10/2019 1126   AMPHETMU NONE DETECTED 07/10/2019 1126   THCU NONE DETECTED 07/10/2019 1126   LABBARB NONE DETECTED 07/10/2019 1126    Alcohol Level No results found for: [facility]  IMAGING  CT HEAD CODE STROKE WO CONTRAST 07/09/2019 IMPRESSION:  1. Intraparenchymal hematoma with volume of 44 cc centered in the left basal ganglia, most consistent with hypertensive hemorrhage.  2. 5 mm rightward midline shift.    Transthoracic Echocardiogram  00/00/2020 Pending   ECG - SR rate 70 BPM. (See cardiology reading for complete details)   PHYSICAL EXAM Blood pressure 132/81, pulse 84, temperature  99.2 F (37.3 C), temperature source Axillary, resp. rate 16, weight 68.4 kg, SpO2 100 %. Frail middle-aged Caucasian lady lying comfortably in bed.  Not in distress. . Afebrile. Head is nontraumatic. Neck is supple without bruit.    Cardiac exam no murmur or gallop. Lungs are clear to auscultation. Distal pulses are well felt.  Neurological Exam :  Patient is drowsy but opens eyes to tactile and auditory stimuli.  She has left gaze preference and can barely look to the midline.  She is globally aphasic and does not speak any words or sentences.  She can barely follow some commands on the left side but not consistently.  Pupils are equal reactive.  Fundi not visualized.  She blinks to threat on the left but not on the right.  Mild right lower facial weakness.  Tongue midline.  Motor system  exam shows dense right hemiplegia but withdraws right leg minimally to pain but not the right upper extremity.  Spontaneous antigravity left upper and lower extremity movements present.  Sensation appears diminished on the right compared to the left.  Right plantar upgoing left downgoing.  Gait not tested.        ASSESSMENT/PLAN [name] is a 53 y.o. female with history of migraines and anxiety presenting with right sided weakness and AMS. She did not receive IV t-PA due to [place].  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 5 mm rightward midline shift.   CT head - not ordered  MRI head - pending  MRA head - pending  CTA H&N - not ordered  CT Perfusion - not ordered  Carotid Doppler - CTA neck performed - carotid dopplers not indicated.  2D Echo - pending  Lacey Jensen Virus 2 - negative  LDL - 156  HgbA1c - pending  UDS   VTE prophylaxis - SCDs Diet  Diet Order            Diet NPO time specified  Diet effective now              No antithrombotic prior to admission, now on No antithrombotic  Ongoing aggressive stroke risk factor management  Therapy recommendations:  pending  Disposition:  Pending  Hypertension  Home BP meds: none   Current BP meds: Cleviprex  Stable . SBP goal < 140 mm Hg initially . Long-term BP goal normotensive  Hyperlipidemia  Home Lipid lowering medication: none   LDL 156, goal < 70  Current lipid lowering medication: None (statin contraindicated with ICH)  Cerebral Edema  5 mm rightward shift  3% saline @ 75 cc / hr  MRI - pending  Na Q 6 hrs 137->138->138  Other Stroke Risk Factors  Former cigarette smoker -  quit  ETOH use, advised to drink no more than 1 alcoholic beverage per day.  Migraines  Other Active Problems  Full Code   Hospital day # 1 She presented with aphasia and right hemiplegia due to large left basal ganglia hemorrhage etiology likely hypertensive.  CT scan shows cytotoxic edema with mild midline shift which is likely to get worse over the next 24 to 48 hours.  Recommend start hypertonic saline at 75 cc an hour through peripheral IV with serum sodium target  goal of 150-155.  Continue strict control of hypertension with systolic blood pressure goal less than 140 for 24 hours and then below 160 thereafter.  Wean Cardene drip and use as needed IV labetalol or hydralazine.  Speech therapy for swallow eval.  DVT prophylaxis and GI prophylaxis.  Check MRI scan of the brain with MRI of the brain, echocardiogram, lipid profile and hemoglobin A1c.  I had a long discussion with the patient's husband at the bedside as well as I spoke to her sister in [place] over the phone about her neurological condition, evaluation and treatment plan and answered questions. This patient is critically ill and at significant risk of neurological worsening, death and care requires constant monitoring of vital signs, hemodynamics,respiratory and cardiac monitoring, extensive review of multiple databases, frequent neurological assessment, discussion with family, other specialists and medical decision making of high complexity.I have made any additions or clarifications directly to the above note.This critical care time does not reflect procedure time, or teaching time or supervisory time of PA/NP/Med Resident etc but could involve care discussion time.  I spent 40 minutes of neurocritical care time  in the care of  this patient.   Antony Contras, MD  To contact Stroke Continuity provider, please refer to [email or web address] After hours, contact General Neurology

## 2019-07-10 NOTE — Progress Notes (Signed)
OT Cancellation Note  Patient Details Name: Melanie Madden MRN: [id number] DOB: March 23, 1966   Cancelled Treatment:    Reason Eval/Treat Not Completed: Active bedrest order;Medical issues which prohibited therapy;Other (comment) Pt still with active bed rest order and MRI Pending. RN requesting hold at this time. Will continue to follow as available and appropriate to initiate OT POC.  Zenovia Jarred, MSOT, OTR/L Behavioral Health OT/ Acute Relief OT [facility] Office: [phone]  Zenovia Jarred 07/10/2019, 3:40 PM

## 2019-07-10 NOTE — Progress Notes (Signed)
Spoke to MRI, state they are planning for later in afternoon and should call be when they are ready.

## 2019-07-10 NOTE — Progress Notes (Signed)
SLP Cancellation Note  Patient Details Name: Melanie Madden MRN: [id number] DOB: 02/19/1966   Cancelled treatment:       Reason Eval/Treat Not Completed: Other (Discussed with RN; pt has been somnolent all day today and is not yet following 1-step commands, still undergoing stroke work up and is awaiting MRI this afternoon.  As a result, will defer cognitive-linguistic evaluation at this time until pt is more alert.  Of note, [facility] swallow screen has not yet been completed due to lethargy, should pt fail swallow screen, please write orders for bedside swallow evaluation)   Herta Hink, Selinda Orion 07/10/2019, 2:33 PM

## 2019-07-11 ENCOUNTER — Inpatient Hospital Stay (HOSPITAL_COMMUNITY): Payer: Managed Care, Other (non HMO)

## 2019-07-11 ENCOUNTER — Inpatient Hospital Stay: Payer: Self-pay

## 2019-07-11 DIAGNOSIS — R1312 Dysphagia, oropharyngeal phase: Secondary | ICD-10-CM

## 2019-07-11 DIAGNOSIS — E7800 Pure hypercholesterolemia, unspecified: Secondary | ICD-10-CM

## 2019-07-11 DIAGNOSIS — I161 Hypertensive emergency: Secondary | ICD-10-CM

## 2019-07-11 LAB — RENAL FUNCTION PANEL
Albumin: 3.8 g/dL (ref 3.5–5.0)
Anion gap: 9 (ref 5–15)
BUN: 14 mg/dL (ref 6–20)
CO2: 20 mmol/L — ABNORMAL LOW (ref 22–32)
Calcium: 10 mg/dL (ref 8.9–10.3)
Chloride: 126 mmol/L — ABNORMAL HIGH (ref 98–111)
Creatinine, Ser: 0.61 mg/dL (ref 0.44–1.00)
GFR calc Af Amer: >60 mL/min (ref >60)
GFR calc non Af Amer: >60 mL/min (ref >60)
Glucose, Bld: 168 mg/dL — ABNORMAL HIGH (ref 70–99)
Phosphorus: 1.7 mg/dL — ABNORMAL LOW (ref 2.5–4.6)
Potassium: 3.5 mmol/L (ref 3.5–5.1)
Sodium: 155 mmol/L — ABNORMAL HIGH (ref 135–145)

## 2019-07-11 LAB — SODIUM
Sodium: 148 mmol/L — ABNORMAL HIGH (ref 135–145)
Sodium: 154 mmol/L — ABNORMAL HIGH (ref 135–145)
Sodium: 157 mmol/L — ABNORMAL HIGH (ref 135–145)
Sodium: 160 mmol/L — ABNORMAL HIGH (ref 135–145)

## 2019-07-11 LAB — GLUCOSE, CAPILLARY: Glucose-Capillary: 123 mg/dL — ABNORMAL HIGH (ref 70–99)

## 2019-07-11 MED ORDER — SODIUM CHLORIDE 0.9% FLUSH
10.0000 mL | Freq: Two times a day (BID) | INTRAVENOUS | Status: DC
  Administered 2019-07-12 – 2019-07-14 (×4): 10 mL
  Administered 2019-07-14: 20 mL
  Administered 2019-07-15 – 2019-07-18 (×7): 10 mL

## 2019-07-11 MED ORDER — SODIUM CHLORIDE 3 % IV SOLN
INTRAVENOUS | Status: DC
  Filled 2019-07-11 (×3): qty 500

## 2019-07-11 MED ORDER — SODIUM CHLORIDE 0.9% FLUSH: 10.0000 mL | INTRAVENOUS | Status: DC | PRN

## 2019-07-11 MED ORDER — SODIUM CHLORIDE 0.9 % IV SOLN
INTRAVENOUS | Status: DC
  Administered 2019-07-11: 15:00:00 via INTRAVENOUS

## 2019-07-11 NOTE — Progress Notes (Signed)
PT Cancellation Note  Patient Details Name: Melanie Madden MRN: [id number] DOB: [birth year]   Cancelled Treatment:    Reason Eval/Treat Not Completed: Active bedrest order;Patient not medically ready.  RN asks to HOLD today.  HR just got under control.  Busy morning also. 07/11/2019  Donnella Sham, [place] [phone]  (pager) [phone]  (office)   Tessie Fass Elasha Tess 07/11/2019, 2:47 PM

## 2019-07-11 NOTE — Progress Notes (Signed)
STROKE TEAM PROGRESS NOTE   INTERVAL HISTORY RN and husband at bedside. Pt awake alert, global aphasia, right hemiplegia, right neglect. Na 157, on 3% and rate down to 37.5. however, NPO status, no po access. Still on cleviprex. Will increase IVF. Pending Monday or Tuesday cortrak placement. MRI showed stable hematoma but raised question of hemorrhagic conversion.    OBJECTIVE Vitals:   07/11/19 1138 07/11/19 1200 07/11/19 1300 07/11/19 1400  BP:  (!) 149/86 (!) 151/85 (!) 141/83  Pulse:  (!) 117 (!) 101 (!) 101  Resp:  (!) 25 (!) 22 18  Temp: 97.6 F (36.4 C)     TempSrc: Axillary     SpO2:  100% 100% 99%  Weight:        CBC:  Recent Labs  Lab 07/09/19 1949 07/09/19 1956  WBC 8.1  --   NEUTROABS 4.1  --   HGB 14.3 13.9  HCT 41.6 41.0  MCV 89.1  --   PLT 191  --     Basic Metabolic Panel:  Recent Labs  Lab 07/09/19 1949 07/09/19 1956 07/11/19 0709 07/11/19 1114  NA 137 138 154* 155*  157*  K 3.8 3.8  --  3.5  CL 106 107  --  126*  CO2 21*  --   --  20*  GLUCOSE 112* 107*  --  168*  BUN 23* 24*  --  14  CREATININE 0.92 0.90  --  0.61  CALCIUM 10.2  --   --  10.0  PHOS  --   --   --  1.7*    Lipid Panel:     Component Value Date/Time   CHOL 265 (H) 07/10/2019 1145   TRIG 142 07/10/2019 1145   HDL 81 07/10/2019 1145   CHOLHDL 3.3 07/10/2019 1145   VLDL 28 07/10/2019 1145   LDLCALC 156 (H) 07/10/2019 1145   HgbA1c:  Lab Results  Component Value Date   HGBA1C 5.3 07/10/2019   Urine Drug Screen:     Component Value Date/Time   LABOPIA NONE DETECTED 07/10/2019 1126   COCAINSCRNUR NONE DETECTED 07/10/2019 1126   LABBENZ NONE DETECTED 07/10/2019 1126   AMPHETMU NONE DETECTED 07/10/2019 1126   THCU NONE DETECTED 07/10/2019 1126   LABBARB NONE DETECTED 07/10/2019 1126    Alcohol Level No results found for: [facility]  IMAGING  CT HEAD CODE STROKE WO CONTRAST 07/09/2019 IMPRESSION:  1. Intraparenchymal hematoma with volume of 44 cc centered in the left  basal ganglia, most consistent with hypertensive hemorrhage.  2. 5 mm rightward midline shift.    MRI MRA Head 07/11/2019 IMPRESSION: 1. Large intra-axial hemorrhage in the left hemisphere centered at the left lentiform nucleus with estimated 53 mL volume appears not significantly changed in size since 07/09/2019. Superimposed restricted diffusion suggesting this is hemorrhagic transformation of an ischemic infarct. Surrounding edema and regional mass effect including mildly increased rightward midline shift of 6-7 mm. No ventricular trapping.  Basilar cisterns remain patent.  2. Trace intraventricular hemorrhage. No definite extra-axial extension of blood (although see #3).  3. Small indeterminate extra-axial collection or mass in the left prepontine cistern measuring 15 x 7 x 11 mm. Burtis Junes this is a small meningioma. Small volume subdural hematoma less likely. Follow-up post-contrast brain MRI would be necessary to confirm.  4. 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    Transthoracic Echocardiogram  00/00/2020 IMPRESSIONS  1. Left ventricular ejection fraction, by visual estimation, is 60 to 65%. The left ventricle has  normal function. There is no left ventricular hypertrophy.  2. The left ventricle has no regional wall motion abnormalities.  3. Global right ventricle has normal systolic function.The right ventricular size is small. No increase in right ventricular wall thickness.  4. Left atrial size was normal.  5. Right atrial size was normal.  6. The mitral valve is normal in structure. No evidence of mitral valve regurgitation.  7. The tricuspid valve is normal in structure. Tricuspid valve regurgitation is trivial.  8. The aortic valve is normal in structure. Aortic valve regurgitation is not visualized.  9. The pulmonic valve was normal in structure. Pulmonic valve regurgitation is not visualized. 10. The atrial septum is grossly  normal.   ECG - SR rate 70 BPM. (See cardiology reading for complete details)   PHYSICAL EXAM Blood pressure (!) 141/83, pulse (!) 101, temperature 97.6 F (36.4 C), temperature source Axillary, resp. rate 18, weight 57.8 kg, SpO2 99 %.  General - Well nourished, well developed, mildly agitated.  Ophthalmologic - fundi not visualized due to noncooperation.  Cardiovascular - Regular rhythm and mild tachycardia.  Neuro - awake alert, nonverbal, not following commands, global aphasia.  PERRL, left gaze preference, barely cross midline, right neglect, not blinking to visual threat on the right.  Right facial droop, tongue protrusion not corporative.  Left upper and lower extremity spontaneous movement against gravity, purposeful.  Right upper extremity 0/5, flaccid.  Right lower extremity mild withdrawal to pain stimulation.  Decreased muscle tone on the right.  Babinski positive on the right. Sensation, coordination and gait not tested.    ASSESSMENT/PLAN Ms. Indiana Pozzi is a 53 y.o. female with history of migraines and anxiety presenting with right sided weakness and AMS. She did not receive IV t-PA due to [place].  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 5 mm rightward midline shift.   MRI head - Large intra-axial hemorrhage in the left hemisphere centered at the left lentiform nucleus with estimated 53 mL volume appears not significantly changed in size since 07/09/2019. Mildly increased rightward midline shift of 6-7 mm.  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  2D Echo - EF 60 - 65%. No cardiac source of emboli identified.   [employer] Virus 2 - negative  LDL - 156  HgbA1c - 5.3  UDS -  neg  VTE prophylaxis - SCDs  No antithrombotic prior to admission, now on No antithrombotic  Ongoing aggressive stroke risk factor management  Therapy recommendations:  pending  Disposition:  Pending  Cerebral edema  CT head showed midline shift 5 mm  MRI repeat showed midline shift 6 to 7 mm  On 3% saline  Na 137->138->138->157->155  Sodium goal 150-160  Sodium every 6 hours  Dysphagia  Due to [place] and aphasia  Speech on board  N.p.o.  Continue IV fluid  Consider cortrak once able  Hypertension  Home BP meds: none   Current BP meds: Cleviprex  Stable  Switch to p.o. BP meds once p.o. access . SBP goal < 160 mm Hg . Long-term BP goal normotensive  Hyperlipidemia  Home Lipid lowering medication: none   LDL 156, goal < 70  Current lipid lowering medication: None for now  Consider statin on discharge  Other Stroke Risk  Factors  Former cigarette smoker - quit  ETOH use, advised to drink no more than 1 alcoholic beverage per day.  Migraines  Other Active Problems  Possible small meningioma. Small volume subdural hematoma less likely. Follow-up post-contrast brain MRI would be necessary to confirm.   Hospital day # 2 This patient is critically ill and at significant risk of neurological worsening, death and care requires constant monitoring of vital signs, hemodynamics,respiratory and cardiac monitoring, extensive review of multiple databases, frequent neurological assessment, discussion with family, other specialists and medical decision making of high complexity. I spent 40 minutes of neurocritical care time  in the care of  this patient. I had long discussion with husband at bedside, updated pt current condition, treatment plan and potential prognosis, and answered all the questions.  Husband expressed understanding and appreciation.   Rosalin Hawking, MD PhD Stroke Neurology 07/11/2019 5:53 PM   To contact Stroke Continuity provider, please  refer to [email or web address] After hours, contact General Neurology

## 2019-07-11 NOTE — Progress Notes (Signed)
Peripherally Inserted Central Catheter/Midline Placement  The IV Nurse has discussed with the patient and/or persons authorized to consent for the patient, the purpose of this procedure and the potential benefits and risks involved with this procedure.  The benefits include less needle sticks, lab draws from the catheter, and the patient may be discharged home with the catheter. Risks include, but not limited to, infection, bleeding, blood clot (thrombus formation), and puncture of an artery; nerve damage and irregular heartbeat and possibility to perform a PICC exchange if needed/ordered by physician.  Alternatives to this procedure were also discussed.  Bard Power PICC patient education guide, fact sheet on infection prevention and patient information card has been provided to patient /or left at bedside. Telephone consent obtained from husband, [name].    PICC/Midline Placement Documentation  PICC Double Lumen 07/11/19 PICC Left Brachial 42 cm 1 cm (Active)  Indication for Insertion or Continuance of Line Vasoactive infusions;Administration of hyperosmolar/irritating solutions (i.e. TPN, Vancomycin, etc.);Limited venous access - need for IV therapy >5 days (PICC only) 07/11/19 1743  Exposed Catheter (cm) 1 cm 07/11/19 1743  Site Assessment Clean;Dry;Intact 07/11/19 1743  Lumen #1 Status Flushed;Saline locked;Blood return noted 07/11/19 1743  Lumen #2 Status Flushed;Saline locked;Blood return noted 07/11/19 1743  Dressing Type Transparent 07/11/19 1743  Dressing Status Clean;Dry;Intact;Antimicrobial disc in place 07/11/19 [place] [employer] checked and tightened 07/11/19 1743  Line Adjustment (NICU/IV Team Only) No 07/11/19 1743  Dressing Intervention New dressing 07/11/19 1743  Dressing Change Due 07/18/19 07/11/19 1743       Rolena Infante 07/11/2019, 5:43 PM

## 2019-07-12 DIAGNOSIS — E876 Hypokalemia: Secondary | ICD-10-CM

## 2019-07-12 LAB — SODIUM
Sodium: 161 mmol/L (ref 135–145)
Sodium: 162 mmol/L (ref 135–145)
Sodium: 159 mmol/L — ABNORMAL HIGH (ref 135–145)

## 2019-07-12 LAB — CBC
HCT: 47.7 % — ABNORMAL HIGH (ref 36.0–46.0)
Hemoglobin: 15.8 g/dL — ABNORMAL HIGH (ref 12.0–15.0)
MCH: 30.4 pg (ref 26.0–34.0)
MCHC: 33.1 g/dL (ref 30.0–36.0)
MCV: 91.9 fL (ref 80.0–100.0)
Platelets: 229 10*3/uL (ref 150–400)
RBC: 5.19 MIL/uL — ABNORMAL HIGH (ref 3.87–5.11)
RDW: 14.3 % (ref 11.5–15.5)
WBC: 10.9 10*3/uL — ABNORMAL HIGH (ref 4.0–10.5)
nRBC: 0 % (ref 0.0–0.2)

## 2019-07-12 LAB — PHOSPHORUS: Phosphorus: 3 mg/dL (ref 2.5–4.6)

## 2019-07-12 MED ORDER — POTASSIUM CHLORIDE 10 MEQ/50ML IV SOLN: 10.0000 meq | INTRAVENOUS | Status: DC

## 2019-07-12 MED ORDER — SODIUM CHLORIDE 0.45 % IV SOLN
INTRAVENOUS | Status: DC
  Administered 2019-07-12 – 2019-07-13 (×2): via INTRAVENOUS

## 2019-07-12 MED ORDER — POTASSIUM CHLORIDE 10 MEQ/100ML IV SOLN
10.0000 meq | INTRAVENOUS | Status: AC
  Administered 2019-07-12 (×6): 10 meq via INTRAVENOUS
  Filled 2019-07-12 (×6): qty 100

## 2019-07-12 NOTE — Evaluation (Signed)
Speech Language Pathology Evaluation Patient Details Name: Melanie Madden MRN: [id number] DOB: 1966/07/24 Today's Date: 07/12/2019 Time: [id number] SLP Time Calculation (min) (ACUTE ONLY): 35 min  Problem List:  Patient Active Problem List   Diagnosis Date Noted  . 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 ICH (intracerebral hemorrhage) ([place]) 07/09/2019  . Anxiety state 02/21/2015  . Cephalalgia 12/21/2014   Past Medical History:  Past Medical History:  Diagnosis Date  . Allergy   . Anxiety   . Arthritis   . High triglycerides   . Kidney stones   . Migraines   . Recurrent sinus infections    Past Surgical History:  Past Surgical History:  Procedure Laterality Date  . ANTERIOR CRUCIATE LIGAMENT REPAIR Left   . BREAST SURGERY     breast reduction  . BUNIONECTOMY Right   . LASIK    . LITHOTRIPSY    . MENISCUS REPAIR Left   . NASAL SEPTUM SURGERY    . REDUCTION MAMMAPLASTY     bilateral  . SHOULDER SURGERY    . VARICOSE VEIN SURGERY Left    HPI:    53 y.o. female who presented on 07/09/19 with right-sided weakness that started abruptly after returning home from work.  Her husband states that she got home around 6 PM, and was relatively normal at that time.  She subsequently stated that she was not feeling well and sat down.  Her mental status then progressively declined and he called 911.  En route, code stroke was activated due to right-sided hemiparesis and after arrival she was taken for CT scan which reveals a large basal ganglia hemorrhage on the left; MRI on 07/11/19 revealed Large intra-axial hemorrhage in the left hemisphere centered at the left lentiform nucleus with estimated 53 mL volume appears not significantly changed in size since 07/09/2019. Superimposed restricted diffusion suggesting this is hemorrhagic transformation of an ischemic infarct. Surrounding edema and regional mass effect including mildly increased rightward midline shift of 6-7 mm. No ventricular  trapping.  Basilar cisterns remain patent.  Assessment / Plan / Recommendation Clinical Impression   Pt presents with global aphasia with 1-step commands followed with 10% accuracy with max multimodal cues provided; pt unable to vocalize or verbalize despite max multimodal cues; orientation decreased overall and pt unable to communicate effectively despite attempts with head nod/shake, eye blinking and/or sign/gestures being unsuccessful during assessment.  Pt did track SLP within room and made eye contact, but unable to follow simple commands and verbalize wants/needs; limited OME as pt unable to follow oral commands, but right hemiparesis noted at rest; ST will f/u for aphasia tx/cognition as pt able. BSE completed this date yielding NPO status with non-oral intake recommended. ST will f/u while in acute setting for dysphagia/linguistic/cognitive deficits. Thank you for this consult.    SLP Assessment  SLP Recommendation/Assessment: Patient needs continued Speech Language Pathology Services SLP Visit Diagnosis: Attention and concentration deficit;Cognitive communication deficit (R41.841);Aphasia (R47.01) Attention and concentration deficit following: Nontraumatic intracerebral hemorrhage    Follow Up Recommendations  Inpatient Rehab    Frequency and Duration min 3x week  1 week      SLP Evaluation Cognition  Overall Cognitive Status: Impaired/Different from baseline Arousal/Alertness: Awake/alert Orientation Level: Other (comment)(UTA) Attention: Sustained Sustained Attention: Impaired Sustained Attention Impairment: Verbal basic;Functional basic Awareness: Impaired Behaviors: Restless       Comprehension  Auditory Comprehension Overall Auditory Comprehension: Impaired Yes/No Questions: Impaired Basic Biographical Questions: 0-25% accurate Commands: Impaired One Step Basic Commands: 0-24% accurate Visual  Recognition/Discrimination Discrimination: Not tested Reading  Comprehension Reading Status: Not tested    Expression Expression Primary Mode of Expression: Other (comment) Verbal Expression Overall Verbal Expression: Impaired Initiation: Impaired Naming: Not tested Pragmatics: Unable to assess Non-Verbal Means of Communication: Gestures(attempted; pt unable to communicate) Written Expression Written Expression: Not tested   Oral / Motor  Oral Motor/Sensory Function Overall Oral Motor/Sensory Function: Moderate impairment Facial ROM: Reduced right Facial Symmetry: Abnormal symmetry right Facial Strength: Reduced right Facial Sensation: Reduced right Lingual ROM: Other (Comment)(DTA) Lingual Strength: Reduced Lingual Sensation: Reduced Motor Speech Overall Motor Speech: Other (comment) Respiration: Within functional limits Phonation: Other (comment)(UTA) Intelligibility: Unable to assess (comment) Motor Planning: Not tested Motor Speech Errors: Not applicable                      Elvina Sidle, M.S., CCC-SLP 07/12/2019, 1:41 PM

## 2019-07-12 NOTE — Progress Notes (Signed)
Assisted tele visit to patient with family member.  Jannatul Wojdyla M, RN  

## 2019-07-12 NOTE — Progress Notes (Signed)
CRITICAL VALUE ALERT  Critical Value: Sodium level 162  Date & Time Notied: 07/12/19 0740  Provider Notified: Dr/ Lavera Guise.  Orders Received/Actions taken:NS at 25 stopped and  1/2 NS at 74ml/hr.

## 2019-07-12 NOTE — Progress Notes (Signed)
CRITICAL VALUE ALERT  Critical Value: Sodium 162  Date & Time Notied: 07/12/19 1303  Provider Notified: Dr. Erlinda Hong, J  Orders Received/Actions taken: No new orders given at this time

## 2019-07-12 NOTE — Progress Notes (Signed)
STROKE TEAM PROGRESS NOTE   INTERVAL HISTORY No family at bedside. Pt awake alert, eyes open, still has global aphasia, right hemiplegia, right neglect. Na 162, off 3% and on 1/2NS. Receiving K supplement.    OBJECTIVE Vitals:   07/12/19 1015 07/12/19 1030 07/12/19 1045 07/12/19 1100  BP: (!) 153/87 (!) 144/82 (!) 145/80 140/81  Pulse: (!) 108 (!) 102 100 94  Resp: 20 18 15 14   Temp:      TempSrc:      SpO2: 100% 100% 100% 100%  Weight:        CBC:  Recent Labs  Lab 07/09/19 1949 07/09/19 1956 07/12/19 0630  WBC 8.1  --  10.9*  NEUTROABS 4.1  --   --   HGB 14.3 13.9 15.8*  HCT 41.6 41.0 47.7*  MCV 89.1  --  91.9  PLT 191  --  [id number]    Basic Metabolic Panel:  Recent Labs  Lab 07/11/19 1114 07/12/19 0040 07/12/19 0630  NA 155*  157* 161* 162*  K 3.5  --  2.9*  CL 126*  --  129*  CO2 20*  --  22  GLUCOSE 168*  --  144*  BUN 14  --  19  CREATININE 0.61  --  0.75  CALCIUM 10.0  --  10.5*  PHOS 1.7*  --  3.0    Lipid Panel:     Component Value Date/Time   CHOL 265 (H) 07/10/2019 1145   TRIG 142 07/10/2019 1145   HDL 81 07/10/2019 1145   CHOLHDL 3.3 07/10/2019 1145   VLDL 28 07/10/2019 1145   LDLCALC 156 (H) 07/10/2019 1145   HgbA1c:  Lab Results  Component Value Date   HGBA1C 5.3 07/10/2019   Urine Drug Screen:     Component Value Date/Time   LABOPIA NONE DETECTED 07/10/2019 1126   COCAINSCRNUR NONE DETECTED 07/10/2019 1126   LABBENZ NONE DETECTED 07/10/2019 1126   AMPHETMU NONE DETECTED 07/10/2019 1126   THCU NONE DETECTED 07/10/2019 1126   LABBARB NONE DETECTED 07/10/2019 1126    Alcohol Level No results found for: [facility]  IMAGING  CT HEAD CODE STROKE WO CONTRAST 07/09/2019 IMPRESSION:  1. Intraparenchymal hematoma with volume of 44 cc centered in the left basal ganglia, most consistent with hypertensive hemorrhage.  2. 5 mm rightward midline shift.    MRI MRA Head 07/11/2019 IMPRESSION: 1. Large intra-axial hemorrhage in the left  hemisphere centered at the left lentiform nucleus with estimated 53 mL volume appears not significantly changed in size since 07/09/2019. Superimposed restricted diffusion suggesting this is hemorrhagic transformation of an ischemic infarct. Surrounding edema and regional mass effect including mildly increased rightward midline shift of 6-7 mm. No ventricular trapping.  Basilar cisterns remain patent.  2. Trace intraventricular hemorrhage. No definite extra-axial extension of blood (although see #3).  3. Small indeterminate extra-axial collection or mass in the left prepontine cistern measuring 15 x 7 x 11 mm. Burtis Junes this is a small meningioma. Small volume subdural hematoma less likely. Follow-up post-contrast brain MRI would be necessary to confirm.  4. 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    Transthoracic Echocardiogram  00/00/2020 IMPRESSIONS  1. Left ventricular ejection fraction, by visual estimation, is 60 to 65%. The left ventricle has normal function. There is no left ventricular hypertrophy.  2. The left ventricle has no regional wall motion abnormalities.  3. Global right ventricle has normal systolic function.The right ventricular size is small. No increase in right ventricular wall  thickness.  4. Left atrial size was normal.  5. Right atrial size was normal.  6. The mitral valve is normal in structure. No evidence of mitral valve regurgitation.  7. The tricuspid valve is normal in structure. Tricuspid valve regurgitation is trivial.  8. The aortic valve is normal in structure. Aortic valve regurgitation is not visualized.  9. The pulmonic valve was normal in structure. Pulmonic valve regurgitation is not visualized. 10. The atrial septum is grossly normal.   ECG - SR rate 70 BPM. (See cardiology reading for complete details)   PHYSICAL EXAM Blood pressure 140/81, pulse 94, temperature 99.7 F (37.6 C), temperature source Axillary,  resp. rate 14, weight 57.8 kg, SpO2 100 %.  General - Well nourished, well developed, not in acute distress.  Ophthalmologic - fundi not visualized due to noncooperation.  Cardiovascular - Regular rhythm and rate  Neuro - awake alert, nonverbal, not following commands, global aphasia.  PERRL, left gaze preference, barely cross midline, right neglect, not blinking to visual threat on the right.  Right facial droop, tongue protrusion not corporative.  Left upper and lower extremity spontaneous movement against gravity, purposeful.  Right upper extremity 0/5, flaccid.  Right lower extremity mild withdrawal to pain stimulation.  Decreased muscle tone on the right.  Babinski positive on the right. Sensation, coordination and gait not tested.    ASSESSMENT/PLAN Melanie Madden is a 53 y.o. female with history of migraines and anxiety presenting with right sided weakness and AMS. She did not receive IV t-PA due to [place].  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 5 mm rightward midline shift.   MRI head - Large intra-axial hemorrhage in the left hemisphere centered at the left lentiform nucleus with estimated 53 mL volume appears not significantly changed in size since 07/09/2019. Mildly increased rightward midline shift of 6-7 mm.  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  CT head repeat in am  2D Echo - EF 60 - 65%. No cardiac source of emboli identified.   [employer] Virus 2 - negative  LDL - 156  HgbA1c - 5.3  UDS - neg  VTE prophylaxis - SCDs  No antithrombotic prior to admission, now on No antithrombotic  Ongoing aggressive stroke risk factor management  Therapy recommendations:   pending  Disposition:  Pending  Cerebral edema  CT head showed midline shift 5 mm  MRI repeat showed midline shift 6 to 7 mm  On 3% saline -> off now   Now on 1/2 NS   Na 137->138->138->157->155->162  Sodium goal 150-160  Sodium every 6 hours  Central Line placed 07/11/19  Dysphagia  Due to [place] and aphasia  Speech on board  N.p.o.  Continue IV fluid  Consider cortrak in am  Hypertension  Home BP meds: none   Current BP meds: Cleviprex  Stable  Switch to p.o. BP meds once p.o. access . SBP goal < 160 mm Hg . Long-term BP goal normotensive  Hyperlipidemia  Home Lipid lowering medication: none   LDL 156, goal < 70  Current lipid lowering medication: None for now  Consider statin on discharge  Other Stroke Risk Factors  Former cigarette smoker - quit  ETOH use, advised to drink no more than 1 alcoholic beverage  per day.  Migraines  Other Active Problems  Possible small meningioma. Small volume subdural hematoma less likely. Follow-up post-contrast brain MRI would be necessary to confirm.  Hypokalemia - 2.9 - potassium ordered - recheck in AM  Mild Leukocytosis - 8.1->10.9 (temp - 99.7 ax) - monitor   Hospital day # 3 This patient is critically ill and at significant risk of neurological worsening, death and care requires constant monitoring of vital signs, hemodynamics,respiratory and cardiac monitoring, extensive review of multiple databases, frequent neurological assessment, discussion with family, other specialists and medical decision making of high complexity. I spent 35 minutes of neurocritical care time  in the care of  this patient.   Rosalin Hawking, MD PhD Stroke Neurology 07/12/2019 5:24 PM   To contact Stroke Continuity provider, please refer to [email or web address] After hours, contact General Neurology

## 2019-07-12 NOTE — Evaluation (Signed)
Clinical/Bedside Swallow Evaluation Patient Details  Name: Melanie Madden MRN: [id number] Date of Birth: 01-15-1966  Today's Date: 07/12/2019 Time: SLP Start Time (ACUTE ONLY): 0848 SLP Stop Time (ACUTE ONLY): 0923 SLP Time Calculation (min) (ACUTE ONLY): 35 min  Past Medical History:  Past Medical History:  Diagnosis Date  . Allergy   . Anxiety   . Arthritis   . High triglycerides   . Kidney stones   . Migraines   . Recurrent sinus infections    Past Surgical History:  Past Surgical History:  Procedure Laterality Date  . ANTERIOR CRUCIATE LIGAMENT REPAIR Left   . BREAST SURGERY     breast reduction  . BUNIONECTOMY Right   . LASIK    . LITHOTRIPSY    . MENISCUS REPAIR Left   . NASAL SEPTUM SURGERY    . REDUCTION MAMMAPLASTY     bilateral  . SHOULDER SURGERY    . VARICOSE VEIN SURGERY Left    HPI:  53 y.o. female who presents with right-sided weakness that started abruptly after returning home from work.  Her husband states that she got home around 6 PM, and was relatively normal at that time.  She subsequently stated that she was not feeling well and sat down.  Her mental status then progressively declined and he called 911.  En route, code stroke was activated due to right-sided hemiparesis and after arrival she was taken for CT scan which reveals a large basal ganglia hemorrhage on the left.   Assessment / Plan / Recommendation Clinical Impression  Pt with severe oropharyngeal dysphagia characterized by decreased oral manipulation/propulsion, oral holding and decreased awareness of bolus;  She also exhibited an audible swallow with immediate weak cough with 1/3 tsp of thin liquids; NPO status recommended at this time; may consider non-oral nutrition; thank you for this consult. SLP Visit Diagnosis: Dysphagia, oropharyngeal phase (R13.12) Attention and concentration deficit following: Nontraumatic intracerebral hemorrhage    Aspiration Risk  Severe aspiration risk;Risk for  inadequate nutrition/hydration    Diet Recommendation   NPO  Medication Administration: Via alternative means    Other  Recommendations Oral Care Recommendations: Oral care QID   Follow up Recommendations Inpatient Rehab      Frequency and Duration min 3x week  1 week       Prognosis Prognosis for Safe Diet Advancement: Guarded Barriers to Reach Goals: Cognitive deficits;Severity of deficits      Swallow Study   General Date of Onset: 07/09/19 HPI: 53 y.o. female who presents with right-sided weakness that started abruptly after returning home from work.  Her husband states that she got home around 6 PM, and was relatively normal at that time.  She subsequently stated that she was not feeling well and sat down.  Her mental status then progressively declined and he called 911.  En route, code stroke was activated due to right-sided hemiparesis and after arrival she was taken for CT scan which reveals a large basal ganglia hemorrhage on the left. Type of Study: Bedside Swallow Evaluation Previous Swallow Assessment: ([facility] swallow screen; failed) Diet Prior to this Study: NPO Temperature Spikes Noted: Yes(low grade, 99) Respiratory Status: Room air History of Recent Intubation: No Behavior/Cognition: Alert;Confused;Distractible;Requires cueing;Doesn't follow directions Oral Cavity Assessment: Dry Oral Care Completed by SLP: Yes Oral Cavity - Dentition: Adequate natural dentition Vision: Impaired for self-feeding Self-Feeding Abilities: Needs assist;Needs set up Patient Positioning: Upright in bed Baseline Vocal Quality: Not observed Volitional Cough: Weak Volitional Swallow: Unable to elicit  Oral/Motor/Sensory Function Overall Oral Motor/Sensory Function: Moderate impairment Facial ROM: Reduced right Facial Symmetry: Abnormal symmetry right Facial Strength: Reduced right Facial Sensation: Reduced right Lingual ROM: Other (Comment) Lingual Strength: Reduced Lingual  Sensation: Reduced   Ice Chips Ice chips: Impaired Presentation: Spoon Oral Phase Impairments: Reduced lingual movement/coordination;Poor awareness of bolus Oral Phase Functional Implications: Prolonged oral transit;Oral holding Pharyngeal Phase Impairments: Suspected delayed Swallow;Decreased hyoid-laryngeal movement   Thin Liquid Thin Liquid: Impaired Presentation: Spoon Oral Phase Impairments: Reduced lingual movement/coordination;Poor awareness of bolus Oral Phase Functional Implications: Right anterior spillage Pharyngeal  Phase Impairments: Suspected delayed Swallow;Cough - Immediate Other Comments: (audible swallow)    Nectar Thick Nectar Thick Liquid: Not tested   Honey Thick Honey Thick Liquid: Not tested   Puree Puree: Not tested   Solid     Solid: Not tested      Elvina Sidle, M.S., CCC-SLP 07/12/2019,2:07 PM

## 2019-07-12 NOTE — Progress Notes (Signed)
Informed Dr Leonel Ramsay of pt sodium level 161 via text message. Received return call from md. Will continue to keep 3% saline infusion off per md verbal order.

## 2019-07-12 NOTE — Plan of Care (Signed)
  Problem: Intracerebral Hemorrhage Tissue Perfusion: Goal: Complications of Intracerebral Hemorrhage will be minimized Outcome: Progressing   Problem: Elimination: Goal: Will not experience complications related to urinary retention Outcome: Progressing   Problem: Skin Integrity: Goal: Risk for impaired skin integrity will decrease Outcome: Progressing

## 2019-07-13 ENCOUNTER — Inpatient Hospital Stay (HOSPITAL_COMMUNITY): Payer: Managed Care, Other (non HMO)

## 2019-07-13 DIAGNOSIS — I610 Nontraumatic intracerebral hemorrhage in hemisphere, subcortical: Principal | ICD-10-CM

## 2019-07-13 LAB — BASIC METABOLIC PANEL
Anion gap: 11 (ref 5–15)
Anion gap: 9 (ref 5–15)
BUN: 19 mg/dL (ref 6–20)
BUN: 21 mg/dL — ABNORMAL HIGH (ref 6–20)
CO2: 22 mmol/L (ref 22–32)
CO2: 23 mmol/L (ref 22–32)
Calcium: 10.5 mg/dL — ABNORMAL HIGH (ref 8.9–10.3)
Calcium: 10.1 mg/dL (ref 8.9–10.3)
Chloride: 129 mmol/L — ABNORMAL HIGH (ref 98–111)
Chloride: 129 mmol/L — ABNORMAL HIGH (ref 98–111)
Creatinine, Ser: 0.75 mg/dL (ref 0.44–1.00)
Creatinine, Ser: 0.68 mg/dL (ref 0.44–1.00)
GFR calc Af Amer: >60 mL/min (ref >60)
GFR calc Af Amer: >60 mL/min (ref >60)
GFR calc non Af Amer: >60 mL/min (ref >60)
GFR calc non Af Amer: >60 mL/min (ref >60)
Glucose, Bld: 144 mg/dL — ABNORMAL HIGH (ref 70–99)
Glucose, Bld: 132 mg/dL — ABNORMAL HIGH (ref 70–99)
Potassium: 2.9 mmol/L — ABNORMAL LOW (ref 3.5–5.1)
Potassium: 3.4 mmol/L — ABNORMAL LOW (ref 3.5–5.1)
Sodium: 162 mmol/L (ref 135–145)
Sodium: 161 mmol/L (ref 135–145)

## 2019-07-13 LAB — COMPREHENSIVE METABOLIC PANEL
ALT: 15 U/L (ref 0–44)
AST: 12 U/L — ABNORMAL LOW (ref 15–41)
Albumin: 3.5 g/dL (ref 3.5–5.0)
Alkaline Phosphatase: 55 U/L (ref 38–126)
Anion gap: 9 (ref 5–15)
BUN: 22 mg/dL — ABNORMAL HIGH (ref 6–20)
CO2: 23 mmol/L (ref 22–32)
Calcium: 10.1 mg/dL (ref 8.9–10.3)
Chloride: 129 mmol/L — ABNORMAL HIGH (ref 98–111)
Creatinine, Ser: 0.64 mg/dL (ref 0.44–1.00)
GFR calc Af Amer: >60 mL/min (ref >60)
GFR calc non Af Amer: >60 mL/min (ref >60)
Glucose, Bld: 135 mg/dL — ABNORMAL HIGH (ref 70–99)
Potassium: 3.1 mmol/L — ABNORMAL LOW (ref 3.5–5.1)
Sodium: 161 mmol/L (ref 135–145)
Total Bilirubin: 1.3 mg/dL — ABNORMAL HIGH (ref 0.3–1.2)
Total Protein: 6.7 g/dL (ref 6.5–8.1)

## 2019-07-13 LAB — MAGNESIUM
Magnesium: 2.3 mg/dL (ref 1.7–2.4)
Magnesium: 2.3 mg/dL (ref 1.7–2.4)
Magnesium: 2.6 mg/dL — ABNORMAL HIGH (ref 1.7–2.4)
Magnesium: 2.5 mg/dL — ABNORMAL HIGH (ref 1.7–2.4)

## 2019-07-13 LAB — GLUCOSE, CAPILLARY
Glucose-Capillary: 109 mg/dL — ABNORMAL HIGH (ref 70–99)
Glucose-Capillary: 104 mg/dL — ABNORMAL HIGH (ref 70–99)

## 2019-07-13 LAB — CBC
HCT: 48.2 % — ABNORMAL HIGH (ref 36.0–46.0)
Hemoglobin: 15.6 g/dL — ABNORMAL HIGH (ref 12.0–15.0)
MCH: 30.2 pg (ref 26.0–34.0)
MCHC: 32.4 g/dL (ref 30.0–36.0)
MCV: 93.4 fL (ref 80.0–100.0)
Platelets: 185 10*3/uL (ref 150–400)
RBC: 5.16 MIL/uL — ABNORMAL HIGH (ref 3.87–5.11)
RDW: 14.4 % (ref 11.5–15.5)
WBC: 8.7 10*3/uL (ref 4.0–10.5)
nRBC: 0 % (ref 0.0–0.2)

## 2019-07-13 LAB — PHOSPHORUS
Phosphorus: 3.2 mg/dL (ref 2.5–4.6)
Phosphorus: 3.2 mg/dL (ref 2.5–4.6)

## 2019-07-13 LAB — SODIUM
Sodium: 159 mmol/L — ABNORMAL HIGH (ref 135–145)
Sodium: 158 mmol/L — ABNORMAL HIGH (ref 135–145)

## 2019-07-13 MED ORDER — AMLODIPINE BESYLATE 10 MG PO TABS
10.0000 mg | ORAL_TABLET | Freq: Every day | ORAL | Status: DC
  Administered 2019-07-13 – 2019-07-15 (×4): 10 mg
  Filled 2019-07-13 (×4): qty 1

## 2019-07-13 MED ORDER — LISINOPRIL 20 MG PO TABS
20.0000 mg | ORAL_TABLET | Freq: Every day | ORAL | Status: DC
  Administered 2019-07-13 (×2): 20 mg
  Filled 2019-07-13: qty 1

## 2019-07-13 MED ORDER — LISINOPRIL 20 MG PO TABS
20.0000 mg | ORAL_TABLET | Freq: Every day | ORAL | Status: DC
  Administered 2019-07-13: 17:00:00 20 mg via ORAL

## 2019-07-13 MED ORDER — PANTOPRAZOLE SODIUM 40 MG PO PACK
40.0000 mg | PACK | Freq: Every day | ORAL | Status: DC
  Administered 2019-07-13 – 2019-07-14 (×2): 40 mg
  Filled 2019-07-13 (×2): qty 20

## 2019-07-13 MED ORDER — POTASSIUM CHLORIDE 10 MEQ/50ML IV SOLN
10.0000 meq | INTRAVENOUS | Status: AC
  Administered 2019-07-13 (×3): 10 meq via INTRAVENOUS
  Filled 2019-07-13 (×3): qty 50

## 2019-07-13 MED ORDER — PANTOPRAZOLE SODIUM 40 MG PO TBEC: 40.0000 mg | DELAYED_RELEASE_TABLET | Freq: Every day | ORAL | Status: DC

## 2019-07-13 MED ORDER — PRO-STAT SUGAR FREE PO LIQD
30.0000 mL | Freq: Two times a day (BID) | ORAL | Status: DC
  Administered 2019-07-13 – 2019-07-15 (×5): 30 mL
  Filled 2019-07-13 (×5): qty 30

## 2019-07-13 MED ORDER — VITAL HIGH PROTEIN PO LIQD
1000.0000 mL | ORAL | Status: DC
  Administered 2019-07-13: 1000 mL

## 2019-07-13 MED ORDER — LIDOCAINE VISCOUS HCL 2 % MT SOLN
10.0000 mL | Freq: Once | OROMUCOSAL | Status: AC
  Administered 2019-07-13: 10 mL via OROMUCOSAL

## 2019-07-13 MED ORDER — SENNOSIDES-DOCUSATE SODIUM 8.6-50 MG PO TABS
1.0000 | ORAL_TABLET | Freq: Two times a day (BID) | ORAL | Status: DC
  Administered 2019-07-13 – 2019-07-15 (×4): 1
  Filled 2019-07-13 (×5): qty 1

## 2019-07-13 MED ORDER — AMLODIPINE BESYLATE 10 MG PO TABS
10.0000 mg | ORAL_TABLET | Freq: Every day | ORAL | Status: DC
  Administered 2019-07-13: 17:00:00 10 mg via ORAL

## 2019-07-13 MED ORDER — FREE WATER
150.0000 mL | Status: DC
  Administered 2019-07-13 (×3): 150 mL

## 2019-07-13 MED ORDER — POTASSIUM CHLORIDE 10 MEQ/50ML IV SOLN
10.0000 meq | INTRAVENOUS | Status: AC
  Administered 2019-07-13 (×2): 10 meq via INTRAVENOUS
  Filled 2019-07-13 (×3): qty 50

## 2019-07-13 NOTE — Progress Notes (Signed)
Orthopedic Tech Progress Note Patient Details:  Melanie Madden 1966/05/17 [id number] Went to apply sling elevator on patient, RN said she needed to confirm with DR about sling. I was asked to leave sling which I did. I let RN know that if patient is to wear it to call me and ill apply it to patient. Patient ID: Melanie Madden, female   DOB: 1965/08/04, 53 y.o.   MRN: [id number]   [name] 07/13/2019, 8:05 AM

## 2019-07-13 NOTE — Progress Notes (Signed)
Orthopedic Tech Progress Note Patient Details:  Melanie Madden [birth year] [id number]  Ortho Devices Type of Ortho Device: Sling arm elevator Ortho Device/Splint Location: RUE Ortho Device/Splint Interventions: Other (comment)   Post Interventions Patient Tolerated: Other (comment) Instructions Provided: Other (comment)   [name] 07/13/2019, 10:59 AM

## 2019-07-13 NOTE — Evaluation (Signed)
Physical Therapy Evaluation Patient Details Name: Melanie Madden MRN: [id number] DOB: 03/24/1966 Today's Date: 07/13/2019   History of Present Illness  Pt adm with rt sided weakness and AMS. 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 PMH - migraines and anxiety  Clinical Impression  Pt presents to PT with significant impairments in strength, balance, vision, communication, and cognition. Prior to admission pt independent and working. Recommend CIR for further rehab.     Follow Up Recommendations CIR;Supervision/Assistance - 24 hour    Equipment Recommendations  Other (comment)(To be determined at next venue)    Recommendations for Other Services       Precautions / Restrictions Precautions Precautions: Fall;Other (comment) Precaution Comments: pusher Restrictions Weight Bearing Restrictions: No      Mobility  Bed Mobility Overal bed mobility: Needs Assistance Bed Mobility: Supine to Sit     Supine to sit: +2 for physical assistance;Max assist;Total assist     General bed mobility comments: Assist to bring legs off of bed, elevate trunk into sitting, and bring hips to EOB. Pt trying to assist with LUE.   Transfers Overall transfer level: Needs assistance               General transfer comment: Pt with heavy pushing with LUE and and unable to safely attempt transfer without lift due to pushing and multiple lines/tubes  Ambulation/Gait                Stairs            Wheelchair Mobility    Modified Rankin (Stroke Patients Only)       Balance Overall balance assessment: Needs assistance Sitting-balance support: Single extremity supported;Feet supported Sitting balance-Leahy Scale: Zero Sitting balance - Comments: Pt with heavy pushing with LUE toward rt requiring max assist to maintain EOB. Attempted to have pt prop on Lt forearm to reduce pushing but pt continuing to push Postural control: Right lateral lean                                    Pertinent Vitals/Pain Pain Assessment: Faces Faces Pain Scale: No hurt    Home Living Family/patient expects to be discharged to:: Private residence Living Arrangements: Spouse/significant other               Additional Comments: Unable to get details of home due to aphasia    Prior Function Level of Independence: Independent         Comments: works     Journalist, newspaper        Extremity/Trunk Assessment   Upper Extremity Assessment Upper Extremity Assessment: Defer to OT evaluation    Lower Extremity Assessment Lower Extremity Assessment: RLE deficits/detail RLE Deficits / Details: Strength 0/5. PROM WFL. Pt trying to use LLE to lift RLE       Communication   Communication: Receptive difficulties;Expressive difficulties  Cognition Arousal/Alertness: Awake/alert Behavior During Therapy: Restless Overall Cognitive Status: Difficult to assess                                 General Comments: performing some actions to visual cues like lifting foot to have sock put on but not following direct verbal commands      General Comments      Exercises     Assessment/Plan    PT Assessment Patient needs  continued PT services  PT Problem List Decreased strength;Decreased balance;Decreased mobility;Decreased cognition;Decreased safety awareness       PT Treatment Interventions DME instruction;Functional mobility training;Therapeutic activities;Therapeutic exercise;Balance training;Neuromuscular re-education;Cognitive remediation;Patient/family education;Wheelchair mobility training    PT Goals (Current goals can be found in the Care Plan section)  Acute Rehab PT Goals Patient Stated Goal: unable to state PT Goal Formulation: Patient unable to participate in goal setting Time For Goal Achievement: 07/27/19 Potential to Achieve Goals: Fair    Frequency Min 4X/week   Barriers to discharge        Co-evaluation PT/OT/SLP  Co-Evaluation/Treatment: Yes Reason for Co-Treatment: Complexity of the patient's impairments (multi-system involvement);For patient/therapist safety PT goals addressed during session: Mobility/safety with mobility;Balance         AM-PAC PT "6 Clicks" Mobility  Outcome Measure Help needed turning from your back to your side while in a flat bed without using bedrails?: Total Help needed moving from lying on your back to sitting on the side of a flat bed without using bedrails?: Total Help needed moving to and from a bed to a chair (including a wheelchair)?: Total Help needed standing up from a chair using your arms (e.g., wheelchair or bedside chair)?: Total Help needed to walk in hospital room?: Total Help needed climbing 3-5 steps with a railing? : Total 6 Click Score: 6    End of Session   Activity Tolerance: Patient tolerated treatment well Patient left: in chair;with call bell/phone within reach;with chair alarm set Nurse Communication: Mobility status;Need for lift equipment PT Visit Diagnosis: Other abnormalities of gait and mobility (R26.89);Hemiplegia and hemiparesis Hemiplegia - Right/Left: Right Hemiplegia - dominant/non-dominant: Dominant Hemiplegia - caused by: Nontraumatic intracerebral hemorrhage    Time: 0940-1007 PT Time Calculation (min) (ACUTE ONLY): 27 min   Charges:   PT Evaluation $PT Eval Moderate Complexity: [place] Pager [phone] Office Secretary 07/13/2019, 10:42 AM

## 2019-07-13 NOTE — Progress Notes (Signed)
STROKE TEAM PROGRESS NOTE   INTERVAL HISTORY Husband at bedside. Pt still global aphasia, right neglect, right hemiplegia. Off cleviprex. Passed swallow, will remain TF at night. Encourage po intake.   OBJECTIVE Vitals:   07/14/19 0815 07/14/19 0900 07/14/19 1000 07/14/19 1045  BP: (!) 151/96 (!) 159/88  (!) 148/92  Pulse: 83 92 90   Resp: 14 16 17 15   Temp:      TempSrc:      SpO2: 100% 100% 100%   Weight:        CBC:  Recent Labs  Lab 07/09/19 1949 07/09/19 1956 07/13/19 0527 07/14/19 0420  WBC 8.1   < > 8.7 9.8  NEUTROABS 4.1  --   --   --   HGB 14.3  --  15.6* 15.1*  HCT 41.6  --  48.2* 47.3*  MCV 89.1   < > 93.4 95.4  PLT 191   < > 185 162   < > = values in this interval not displayed.    Basic Metabolic Panel:  Recent Labs  Lab 07/13/19 0555 07/13/19 1821 07/13/19 2140 07/14/19 0420  NA 161* 158* 161* 160*  K 3.4*  --   --  3.5  CL 129*  --   --  125*  CO2 23  --   --  25  GLUCOSE 132*  --   --  153*  BUN 21*  --   --  31*  CREATININE 0.68  --   --  0.65  CALCIUM 10.1  --   --  9.9  MG 2.3 2.5*  --  2.4  PHOS  --  3.2  --  3.8    Lipid Panel:     Component Value Date/Time   CHOL 265 (H) 07/10/2019 1145   TRIG 166 (H) 07/14/2019 0420   HDL 81 07/10/2019 1145   CHOLHDL 3.3 07/10/2019 1145   VLDL 28 07/10/2019 1145   LDLCALC 156 (H) 07/10/2019 1145   HgbA1c:  Lab Results  Component Value Date   HGBA1C 5.3 07/10/2019   Urine Drug Screen:     Component Value Date/Time   LABOPIA NONE DETECTED 07/10/2019 1126   COCAINSCRNUR NONE DETECTED 07/10/2019 1126   LABBENZ NONE DETECTED 07/10/2019 1126   AMPHETMU NONE DETECTED 07/10/2019 1126   THCU NONE DETECTED 07/10/2019 1126   LABBARB NONE DETECTED 07/10/2019 1126    Alcohol Level No results found for: [facility]  IMAGING  CT HEAD CODE STROKE WO CONTRAST 07/09/2019 IMPRESSION:  1. Intraparenchymal hematoma with volume of 44 cc centered in the left basal ganglia, most consistent with hypertensive  hemorrhage.  2. 5 mm rightward midline shift.   MRI MRA Head 07/11/2019 IMPRESSION: 1. Large intra-axial hemorrhage in the left hemisphere centered at the left lentiform nucleus with estimated 53 mL volume appears not significantly changed in size since 07/09/2019. Superimposed restricted diffusion suggesting this is hemorrhagic transformation of an ischemic infarct. Surrounding edema and regional mass effect including mildly increased rightward midline shift of 6-7 mm. No ventricular trapping.  Basilar cisterns remain patent. 2. Trace intraventricular hemorrhage. No definite extra-axial extension of blood (although see #3). 3. Small indeterminate extra-axial collection or mass in the left prepontine cistern measuring 15 x 7 x 11 mm. Burtis Junes this is a small meningioma. Small volume subdural hematoma less likely. Follow-up post-contrast brain MRI would be necessary to confirm. 4. 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  CT HEAD WO CONTRAST 07/13/2019 1. Compared to brain MRI 2 days  ago unchanged left cerebral hemorrhage with vasogenic edema and 6 mm of midline shift. 2. 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    Transthoracic Echocardiogram  07/10/2019 IMPRESSIONS  1. Left ventricular ejection fraction, by visual estimation, is 60 to 65%. The left ventricle has normal function. There is no left ventricular hypertrophy.  2. The left ventricle has no regional wall motion abnormalities.  3. Global right ventricle has normal systolic function.The right ventricular size is small. No increase in right ventricular wall thickness.  4. Left atrial size was normal.  5. Right atrial size was normal.  6. The mitral valve is normal in structure. No evidence of mitral valve regurgitation.  7. The tricuspid valve is normal in structure. Tricuspid valve regurgitation is trivial.  8. The aortic valve is normal in structure. Aortic valve regurgitation is not  visualized.  9. The pulmonic valve was normal in structure. Pulmonic valve regurgitation is not visualized. 10. The atrial septum is grossly normal.  ECG - SR rate 70 BPM. (See cardiology reading for complete details)   PHYSICAL EXAM  Blood pressure (!) 148/92, pulse 90, temperature 98.9 F (37.2 C), temperature source Axillary, resp. rate 15, weight 56 kg, SpO2 100 %.  General - Well nourished, well developed, not in acute distress.  Ophthalmologic - fundi not visualized due to noncooperation.  Cardiovascular - Regular rhythm and rate  Neuro - awake alert, nonverbal, not following commands, global aphasia.  PERRL, left gaze preference, barely cross midline, right neglect, not blinking to visual threat on the right.  Right facial droop, tongue protrusion not corporative.  Left upper and lower extremity spontaneous movement against gravity, purposeful.  Right upper extremity 0/5, flaccid.  Right lower extremity mild withdrawal to pain stimulation.  Decreased muscle tone on the right.  Babinski positive on the right. Sensation, coordination and gait not tested.    ASSESSMENT/PLAN [name] is a 53 y.o. female with history of migraines and anxiety presenting with right sided weakness and AMS. She did not receive IV t-PA due to [place].  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 5 mm rightward midline shift.   MRI head - Large intra-axial hemorrhage in the left hemisphere centered at the left lentiform nucleus with estimated 53 mL volume appears not significantly changed in size since 07/09/2019. Mildly increased rightward midline shift of 6-7 mm.  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  CT head  repeat 12/14 unchanged L cerebral hemorrhage w/ vasogenic edema and 38mm midline shift.  2D Echo - EF 60 - 65%. No cardiac source of emboli identified.   Sars Corona Virus 2 - negative  LDL - 156  HgbA1c - 5.3  UDS - neg  VTE prophylaxis - SCDs  No antithrombotic prior to admission, now on No antithrombotic  Therapy recommendations:  CIR  Disposition:  Pending  Cerebral edema  CT head showed midline shift 5 mm  MRI repeat showed midline shift 6 to 7 mm  CT repeat stable hematoma and MLS at 82mm  On 3% saline -> off now   Now on 1/2 NS   Na 137->138->138->157->155->162->161->160->156   Sodium goal 150-160  Sodium every 6 hours  Central Line placed 07/11/19  Dysphagia  Due to [place] and aphasia  Speech on board  N.p.o.  Continue IV fluid  Cortrak by flouro placed 12/14  Cleared for D2 thin liquid diet  On TF at night only and free water  Hypertension  Home BP meds: none   Off cleviprex  On lisinopril 20 bid, norvasc 10 . SBP goal < 160 mm Hg . Stable . Long-term BP goal normotensive  Hyperlipidemia  Home Lipid lowering medication: none   LDL 156, goal < 70  Current lipid lowering medication: None for now  Consider statin on discharge  Other Stroke Risk Factors  Former cigarette smoker - quit  ETOH use, advised to drink no more than 1 alcoholic beverage per day.  Migraines  Other Active Problems  Possible small meningioma. Small volume subdural hematoma less likely. Follow-up post-contrast brain MRI would be necessary to confirm.  Hypokalemia - 2.9 ->3.4->3.5 - supplement  Mild Leukocytosis - 8.1->10.9->8.7->9.8   - monitor   Hospital day # 5   This patient is critically ill and at significant risk of neurological worsening, death and care requires constant monitoring of vital signs, hemodynamics,respiratory and cardiac monitoring, extensive review of multiple databases, frequent neurological assessment, discussion with family,  other specialists and medical decision making of high complexity. I spent 35 minutes of neurocritical care time  in the care of  this patient. I had long discussion with husband at bedside, updated pt current condition, treatment plan and potential prognosis, and answered all the questions. He expressed understanding and appreciation.    Rosalin Hawking, MD PhD Stroke Neurology 07/14/2019 11:01 AM   To contact Stroke Continuity provider, please refer to [email or web address] After hours, contact General Neurology

## 2019-07-13 NOTE — Progress Notes (Signed)
Rehab Admissions Coordinator Note:  Per therapy recommendations, patient was screened by Michel Santee for appropriateness for an Inpatient Acute Rehab Consult.  At this time, we are recommending Inpatient Rehab consult.  I will place an order so we can evaluate the pt.   Michel Santee, PT, DPT 07/13/2019, 12:25 PM  I can be reached at [phone].

## 2019-07-13 NOTE — Progress Notes (Signed)
Cortrak Tube Team Note:  Small bore feeding tube placed by diagnostic radiology. RD secured tube with bridle in L nare at 103 cm marking.   Mariana Single RD, LDN Clinical Nutrition Pager # [phone]

## 2019-07-13 NOTE — Consult Note (Signed)
Physical Medicine and Rehabilitation Consult Reason for Consult: Decreased functional mobility with right side weakness Referring Physician: Dr.Xu   HPI: Melanie Madden is a 53 y.o. right-handed female with history of hypertension, anxiety, migraine headaches and tobacco abuse.  Per chart review patient lives with spouse independent prior to admission.  Presented 07/09/2019 with right side weakness and aphasia of acute onset after returning home from work.  Cranial CT scan showed intraparenchymal hematoma with volume of 44 cc centered in the left basal ganglia most consistent with hypertensive hemorrhage.  5 mm rightward midline shift.  MRA of the head negative.  Echocardiogram with ejection fraction of 65%.  Patient did not receive TPA due to [place].  Admission labs SARS coronavirus negative.  Patient initially on 3% saline.  Cleviprex for blood pressure control.  Patient is currently n.p.o.  Therapy evaluations completed recommendations of physical medicine rehab consult.    Review of Systems  Constitutional: Negative for chills and fever.  HENT: Negative for hearing loss.   Eyes: Negative for blurred vision and double vision.  Respiratory: Negative for cough and shortness of breath.   Cardiovascular: Negative for chest pain and palpitations.  Gastrointestinal: Positive for constipation. Negative for heartburn, nausea and vomiting.  Genitourinary: Negative for flank pain and hematuria.  Musculoskeletal: Positive for myalgias.  Neurological: Positive for headaches.  Psychiatric/Behavioral:       Anxiety  All other systems reviewed and are negative.  Past Medical History:  Diagnosis Date  . Allergy   . Anxiety   . Arthritis   . High triglycerides   . Kidney stones   . Migraines   . Recurrent sinus infections    Past Surgical History:  Procedure Laterality Date  . ANTERIOR CRUCIATE LIGAMENT REPAIR Left   . BREAST SURGERY     breast reduction  . BUNIONECTOMY Right   . LASIK     . LITHOTRIPSY    . MENISCUS REPAIR Left   . NASAL SEPTUM SURGERY    . REDUCTION MAMMAPLASTY     bilateral  . SHOULDER SURGERY    . VARICOSE VEIN SURGERY Left    Family History  Problem Relation Age of Onset  . Alzheimer's disease Mother   . Hypertension Mother   . Diabetes Mother   . Thyroid disease Mother   . Deep vein thrombosis Maternal Grandmother   . Colon cancer Neg Hx   . Colon polyps Neg Hx   . Esophageal cancer Neg Hx   . Pancreatic cancer Neg Hx   . Rectal cancer Neg Hx   . Stomach cancer Neg Hx   . Breast cancer Neg Hx    Social History:  reports that she has quit smoking. She has never used smokeless tobacco. She reports current alcohol use. She reports that she does not use drugs. Allergies:  Allergies  Allergen Reactions  . Amoxicillin Nausea Only  . Shellfish Allergy Diarrhea and Nausea And Vomiting    [place]    Facility-Administered Medications Prior to Admission  Medication Dose Route Frequency Provider Last Rate Last Admin  . 0.9 %  sodium chloride infusion  500 mL Intravenous Continuous Nandigam, Venia Minks, MD       Medications Prior to Admission  Medication Sig Dispense Refill  . acetaminophen (TYLENOL) 500 MG tablet Take 1 tablet (500 mg total) by mouth every 6 (six) hours as needed. (Patient taking differently: Take 500 mg by mouth every 6 (six) hours as needed for mild pain. ) 30 tablet  0  . B Complex-C (B-COMPLEX WITH VITAMIN C) tablet Take 1 tablet by mouth daily.    Marland Kitchen BIOTIN PO Take by mouth.    . cetirizine (ZYRTEC) 10 MG tablet Take 10 mg by mouth daily.    . cholecalciferol (VITAMIN D) 1000 units tablet Take 1,000 Units by mouth daily. 2,000 units daily    . Cyanocobalamin (VITAMIN B-12 PO) Take by mouth.    . docusate sodium (COLACE) 100 MG capsule Take 100 mg by mouth daily.    . Flaxseed, Linseed, (FLAX SEEDS PO) Take 1 tablet by mouth daily.    . fluticasone (FLONASE) 50 MCG/ACT nasal spray Place 1 spray into both nostrils 2 (two) times  daily.    . Ginkgo Biloba (GINKGO PO) Take by mouth.    . hydrOXYzine (ATARAX/VISTARIL) 25 MG tablet Take 1 tablet (25 mg total) by mouth 3 (three) times daily as needed. (Patient not taking: Reported on 01/29/2018) 90 tablet 1  . ibuprofen (ADVIL,MOTRIN) 600 MG tablet Take 1 tablet (600 mg total) by mouth every 6 (six) hours as needed. 30 tablet 0  . ipratropium (ATROVENT) 0.03 % nasal spray Place into the nose.    . magnesium oxide (MAG-OX) 400 (241.3 Mg) MG tablet Take 400 mg by mouth daily.    . medroxyPROGESTERone (DEPO-PROVERA) 150 MG/ML injection Inject 150 mg into the muscle every 3 (three) months.     . methocarbamol (ROBAXIN) 500 MG tablet Take 1 tablet (500 mg total) by mouth every 8 (eight) hours as needed for muscle spasms. 20 tablet 0  . naproxen (NAPROSYN) 500 MG tablet Take 1 tablet (500 mg total) by mouth every 12 (twelve) hours as needed for mild pain or moderate pain. 30 tablet 0  . NON FORMULARY caltrate bone plus minerals 600plus d3    . Omega-3 Fatty Acids (FISH OIL) 1200 MG CPDR Take by mouth.    . Red Yeast Rice Extract (RED YEAST RICE PO) Take by mouth.    Marland Kitchen UNABLE TO FIND verisol collagen powder    . UNABLE TO FIND cbd oil    . VITAMIN E PO Take by mouth.    . zinc sulfate (ZINC-220) 220 (50 Zn) MG capsule Take 220 mg by mouth daily.      Home: Home Living Family/patient expects to be discharged to:: Private residence Living Arrangements: Spouse/significant other Available Help at Discharge: Family, Friend(s), Available PRN/intermittently Additional Comments: Unable to get details of home due to aphasia  Lives With: Spouse  Functional History: Prior Function Level of Independence: Independent Comments: works Functional Status:  Mobility: Bed Mobility Overal bed mobility: Needs Assistance Bed Mobility: Supine to Sit Supine to sit: +2 for physical assistance, Max assist, Total assist General bed mobility comments: Assist to bring legs off of bed, elevate trunk  into sitting, and bring hips to EOB. Pt trying to assist with LUE.  Transfers Overall transfer level: Needs assistance Transfer via Lift Equipment: [place] transfer comment: Pt with heavy pushing with LUE and and unable to safely attempt transfer without lift due to pushing and multiple lines/tubes      ADL:    Cognition: Cognition Overall Cognitive Status: Difficult to assess Arousal/Alertness: Awake/alert Orientation Level: Other (comment)(global aphasia) Attention: Sustained Sustained Attention: Impaired Sustained Attention Impairment: Verbal basic, Functional basic Awareness: Impaired Behaviors: Restless Cognition Arousal/Alertness: Awake/alert Behavior During Therapy: Restless Overall Cognitive Status: Difficult to assess General Comments: performing some actions to visual cues like lifting foot to have sock put on but not  following direct verbal commands Difficult to assess due to: Impaired communication  Blood pressure (!) 151/98, pulse 67, temperature 98.5 F (36.9 C), temperature source Oral, resp. rate 17, weight 57.8 kg, SpO2 (!) 84 %. Physical Exam  Neurological:  Exam very limited as patient having difficulty participating.  Appears to be globally aphasic with neglect.  Overall exam was limited.   General: No acute distress Mood and affect are appropriate Heart: Regular rate and rhythm no rubs murmurs or extra sounds Lungs: Clear to auscultation, breathing unlabored, no rales or wheezes Abdomen: Positive bowel sounds, soft nontender to palpation, nondistended Extremities: No clubbing, cyanosis, or edema Skin: No evidence of breakdown, no evidence of rash Neurologic: Cannot perform cranial nerve testing secondary to mental status motor strength is 5/5 in left  deltoid, bicep, tricep, grip, hip flexor, knee extensors, ankle dorsiflexor and plantar flexor 0/5 in the right deltoid, bicep, tricep, grip, hip flexion, knee extensor, ankle dorsiflexor and  plantar flexor Needs gestural cues for manual muscle testing instructions. Sensory exam difficult to assess secondary to mental status, she does wince and withdraw to pinch in the left upper and left lower limb but not on the right side  Musculoskeletal: No pain with passive and active range of motion in upper and lower limbs no joint swelling  Results for orders placed or performed during the hospital encounter of 07/09/19 (from the past 24 hour(s))  Sodium     Status: Abnormal   Collection Time: 07/12/19  6:15 PM  Result Value Ref Range   Sodium 159 (H) 135 - 145 mmol/L  Comprehensive metabolic panel     Status: Abnormal   Collection Time: 07/13/19 12:15 AM  Result Value Ref Range   Sodium 161 (HH) 135 - 145 mmol/L   Potassium 3.1 (L) 3.5 - 5.1 mmol/L   Chloride 129 (H) 98 - 111 mmol/L   CO2 23 22 - 32 mmol/L   Glucose, Bld 135 (H) 70 - 99 mg/dL   BUN 22 (H) 6 - 20 mg/dL   Creatinine, Ser 0.64 0.44 - 1.00 mg/dL   Calcium 10.1 8.9 - 10.3 mg/dL   Total Protein 6.7 6.5 - 8.1 g/dL   Albumin 3.5 3.5 - 5.0 g/dL   AST 12 (L) 15 - 41 U/L   ALT 15 0 - 44 U/L   Alkaline Phosphatase 55 38 - 126 U/L   Total Bilirubin 1.3 (H) 0.3 - 1.2 mg/dL   GFR calc non Af Amer >60 >60 mL/min   GFR calc Af Amer >60 >60 mL/min   Anion gap 9 5 - 15  Magnesium     Status: None   Collection Time: 07/13/19 12:15 AM  Result Value Ref Range   Magnesium 2.3 1.7 - 2.4 mg/dL  CBC     Status: Abnormal   Collection Time: 07/13/19  5:27 AM  Result Value Ref Range   WBC 8.7 4.0 - 10.5 K/uL   RBC 5.16 (H) 3.87 - 5.11 MIL/uL   Hemoglobin 15.6 (H) 12.0 - 15.0 g/dL   HCT 48.2 (H) 36.0 - 46.0 %   MCV 93.4 80.0 - 100.0 fL   MCH 30.2 26.0 - 34.0 pg   MCHC 32.4 30.0 - 36.0 g/dL   RDW 14.4 11.5 - 15.5 %   Platelets 185 150 - 400 K/uL   nRBC 0.0 0.0 - 0.2 %  Basic metabolic panel     Status: Abnormal   Collection Time: 07/13/19  5:55 AM  Result Value Ref Range  Sodium 161 (HH) 135 - 145 mmol/L   Potassium 3.4  (L) 3.5 - 5.1 mmol/L   Chloride 129 (H) 98 - 111 mmol/L   CO2 23 22 - 32 mmol/L   Glucose, Bld 132 (H) 70 - 99 mg/dL   BUN 21 (H) 6 - 20 mg/dL   Creatinine, Ser 0.68 0.44 - 1.00 mg/dL   Calcium 10.1 8.9 - 10.3 mg/dL   GFR calc non Af Amer >60 >60 mL/min   GFR calc Af Amer >60 >60 mL/min   Anion gap 9 5 - 15  Magnesium     Status: None   Collection Time: 07/13/19  5:55 AM  Result Value Ref Range   Magnesium 2.3 1.7 - 2.4 mg/dL  Glucose, capillary     Status: Abnormal   Collection Time: 07/13/19  7:40 AM  Result Value Ref Range   Glucose-Capillary 109 (H) 70 - 99 mg/dL   CT HEAD WO CONTRAST  Result Date: 07/13/2019 CLINICAL DATA:  Follow-up intracranial hemorrhage. EXAM: CT HEAD WITHOUT CONTRAST TECHNIQUE: Contiguous axial images were obtained from the base of the skull through the vertex without intravenous contrast. COMPARISON:  Four days ago FINDINGS: Brain: Given differences in slice selection unchanged size and shape of the left cerebral hemorrhage centered at the basal ganglia and deep white matter, up to 5 x 4 cm on axial slices. There is expected increase in surrounding edema when compared to CT, stable from brain MRI 2 days ago. Midline shift measures 6-7 mm, stable from brain MRI. No evidence of interval hemorrhage. No hydrocephalus. Subtle mass along the left petrous ridge better seen on brain MRI. Vascular: Negative Skull: Negative Sinuses/Orbits: Negative IMPRESSION: 1. Compared to brain MRI 2 days ago unchanged left cerebral hemorrhage with vasogenic edema and 6 mm of midline shift. 2. 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 Electronically Signed   By: Monte Fantasia M.D.   On: 07/13/2019 06:22   [place] EKG SITE RITE  Result Date: 07/11/2019 If Site Rite image not attached, placement could not be confirmed due to current cardiac rhythm.    Assessment/Plan: Diagnosis: Left basal ganglia hypertensive hemorrhage 1. Does the need for close, 24 hr/day medical  supervision in concert with the patient's rehab needs make it unreasonable for this patient to be served in a less intensive setting? Yes 2. Co-Morbidities requiring supervision/potential complications: Uncontrolled hypertension right hemiplegia right hemisensory deficits, global aphasia, dysphagia, n.p.o. 3. Due to bladder management, bowel management, safety, skin/wound care, disease management, medication administration, pain management and patient education, does the patient require 24 hr/day rehab nursing? Yes 4. Does the patient require coordinated care of a physician, rehab nurse, therapy disciplines of PT, OT, speech therapy to address physical and functional deficits in the context of the above medical diagnosis(es)? Yes Addressing deficits in the following areas: balance, endurance, locomotion, strength, transferring, bowel/bladder control, bathing, dressing, feeding, grooming, toileting, cognition, speech, language, swallowing and psychosocial support 5. Can the patient actively participate in an intensive therapy program of at least 3 hrs of therapy per day at least 5 days per week? Potentially 6. The potential for patient to make measurable gains while on inpatient rehab is fair 7. Anticipated functional outcomes upon discharge from inpatient rehab are min assist and mod assist  with PT, min assist and mod assist with OT, min assist and mod assist with SLP. 8. Estimated rehab length of stay to reach the above functional goals is: 21 to 25 days 9. Anticipated discharge destination: Home  versus SNF 10. Overall Rehab/Functional Prognosis: good  RECOMMENDATIONS: This patient's condition is appropriate for continued rehabilitative care in the following setting: CIR once blood pressures controlled off IV medications Patient has agreed to participate in recommended program. Potentially Note that insurance prior authorization may be required for reimbursement for recommended care.  Comment:  Discussed with husband severe hemiplegia, global aphasia, severe swallowing impairment.  Possible need for PEG placement   Cathlyn Parsons, PA-C 07/13/2019   "I have personally performed a face to face diagnostic evaluation of this patient.  Additionally, I have reviewed and concur with the physician assistant's documentation above." Charlett Blake M.D. [facility] FAAPM&R (Sports Med, Neuromuscular Med) Diplomate Am Board of Electrodiagnostic Med

## 2019-07-13 NOTE — Progress Notes (Signed)
STROKE TEAM PROGRESS NOTE   INTERVAL HISTORY No family at bedside. Pt awake alert, sitting in chair, eyes open, still has global aphasia, right hemiplegia, right neglect. Na 161, on 1/2NS. Did not pass swallow, will do cortrak today and start tube feeding. K 3.4, continue supplement.    OBJECTIVE Vitals:   07/13/19 0930 07/13/19 0945 07/13/19 1000 07/13/19 1015  BP: (!) 164/96 (!) 146/97 (!) 151/101 (!) 151/98  Pulse: 67     Resp: (!) 27 20 17 17   Temp:      TempSrc:      SpO2: (!) 84%     Weight:        CBC:  Recent Labs  Lab 07/09/19 1949 07/12/19 0630 07/13/19 0527  WBC 8.1 10.9* 8.7  NEUTROABS 4.1  --   --   HGB 14.3 15.8* 15.6*  HCT 41.6 47.7* 48.2*  MCV 89.1 91.9 93.4  PLT 191 229 [id number]    Basic Metabolic Panel:  Recent Labs  Lab 07/11/19 1114 07/12/19 0630 07/13/19 0015 07/13/19 0555  NA 155*  157* 162* 161* 161*  K 3.5 2.9* 3.1* 3.4*  CL 126* 129* 129* 129*  CO2 20* 22 23 23   GLUCOSE 168* 144* 135* 132*  BUN 14 19 22* 21*  CREATININE 0.61 0.75 0.64 0.68  CALCIUM 10.0 10.5* 10.1 10.1  MG  --   --  2.3 2.3  PHOS 1.7* 3.0  --   --     Lipid Panel:     Component Value Date/Time   CHOL 265 (H) 07/10/2019 1145   TRIG 142 07/10/2019 1145   HDL 81 07/10/2019 1145   CHOLHDL 3.3 07/10/2019 1145   VLDL 28 07/10/2019 1145   LDLCALC 156 (H) 07/10/2019 1145   HgbA1c:  Lab Results  Component Value Date   HGBA1C 5.3 07/10/2019   Urine Drug Screen:     Component Value Date/Time   LABOPIA NONE DETECTED 07/10/2019 1126   COCAINSCRNUR NONE DETECTED 07/10/2019 1126   LABBENZ NONE DETECTED 07/10/2019 1126   AMPHETMU NONE DETECTED 07/10/2019 1126   THCU NONE DETECTED 07/10/2019 1126   LABBARB NONE DETECTED 07/10/2019 1126    Alcohol Level No results found for: [facility]  IMAGING CT HEAD WO CONTRAST  Result Date: 07/13/2019 CLINICAL DATA:  Follow-up intracranial hemorrhage. EXAM: CT HEAD WITHOUT CONTRAST TECHNIQUE: Contiguous axial images were obtained from  the base of the skull through the vertex without intravenous contrast. COMPARISON:  Four days ago FINDINGS: Brain: Given differences in slice selection unchanged size and shape of the left cerebral hemorrhage centered at the basal ganglia and deep white matter, up to 5 x 4 cm on axial slices. There is expected increase in surrounding edema when compared to CT, stable from brain MRI 2 days ago. Midline shift measures 6-7 mm, stable from brain MRI. No evidence of interval hemorrhage. No hydrocephalus. Subtle mass along the left petrous ridge better seen on brain MRI. Vascular: Negative Skull: Negative Sinuses/Orbits: Negative IMPRESSION: 1. Compared to brain MRI 2 days ago unchanged left cerebral hemorrhage with vasogenic edema and 6 mm of midline shift. 2. 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 Electronically Signed   By: Monte Fantasia M.D.   On: 07/13/2019 06:22    CT HEAD CODE STROKE WO CONTRAST 07/09/2019 IMPRESSION:  1. Intraparenchymal hematoma with volume of 44 cc centered in the left basal ganglia, most consistent with hypertensive hemorrhage.  2. 5 mm rightward midline shift.    MRI MRA Head 07/11/2019 IMPRESSION:  1. Large intra-axial hemorrhage in the left hemisphere centered at the left lentiform nucleus with estimated 53 mL volume appears not significantly changed in size since 07/09/2019. Superimposed restricted diffusion suggesting this is hemorrhagic transformation of an ischemic infarct. Surrounding edema and regional mass effect including mildly increased rightward midline shift of 6-7 mm. No ventricular trapping.  Basilar cisterns remain patent.  2. Trace intraventricular hemorrhage. No definite extra-axial extension of blood (although see #3).  3. Small indeterminate extra-axial collection or mass in the left prepontine cistern measuring 15 x 7 x 11 mm. Burtis Junes this is a small meningioma. Small volume subdural hematoma less likely. Follow-up post-contrast brain MRI  would be necessary to confirm.  4. 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    Transthoracic Echocardiogram  00/00/2020 IMPRESSIONS  1. Left ventricular ejection fraction, by visual estimation, is 60 to 65%. The left ventricle has normal function. There is no left ventricular hypertrophy.  2. The left ventricle has no regional wall motion abnormalities.  3. Global right ventricle has normal systolic function.The right ventricular size is small. No increase in right ventricular wall thickness.  4. Left atrial size was normal.  5. Right atrial size was normal.  6. The mitral valve is normal in structure. No evidence of mitral valve regurgitation.  7. The tricuspid valve is normal in structure. Tricuspid valve regurgitation is trivial.  8. The aortic valve is normal in structure. Aortic valve regurgitation is not visualized.  9. The pulmonic valve was normal in structure. Pulmonic valve regurgitation is not visualized. 10. The atrial septum is grossly normal.   ECG - SR rate 70 BPM. (See cardiology reading for complete details)   PHYSICAL EXAM Blood pressure (!) 151/98, pulse 67, temperature 98.8 F (37.1 C), temperature source Oral, resp. rate 17, weight 57.8 kg, SpO2 (!) 84 %.  General - Well nourished, well developed, not in acute distress.  Ophthalmologic - fundi not visualized due to noncooperation.  Cardiovascular - Regular rhythm and rate  Neuro - awake alert, nonverbal, not following commands, global aphasia.  PERRL, left gaze preference, barely cross midline, right neglect, not blinking to visual threat on the right.  Right facial droop, tongue protrusion not corporative.  Left upper and lower extremity spontaneous movement against gravity, purposeful.  Right upper extremity 0/5, flaccid.  Right lower extremity mild withdrawal to pain stimulation.  Decreased muscle tone on the right.  Babinski positive on the right. Sensation, coordination  and gait not tested.    ASSESSMENT/PLAN Melanie Madden is a 53 y.o. female with history of migraines and anxiety presenting with right sided weakness and AMS. She did not receive IV t-PA due to [place].  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 5 mm rightward midline shift.   MRI head - Large intra-axial hemorrhage in the left hemisphere centered at the left lentiform nucleus with estimated 53 mL volume appears not significantly changed in size since 07/09/2019. Mildly increased rightward midline shift of 6-7 mm.  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  CT head 07/13/19 - unchanged left cerebral hemorrhage with vasogenic edema and 6 mm of midline shift  2D Echo - EF 60 - 65%. No cardiac source of emboli identified.   [employer] Virus 2 -  negative  LDL - 156  HgbA1c - 5.3  UDS - neg  VTE prophylaxis - SCDs  No antithrombotic prior to admission, now on No antithrombotic  Ongoing aggressive stroke risk factor management  Therapy recommendations:  pending  Disposition:  Pending  Cerebral edema  CT head showed midline shift 5 mm  MRI repeat showed midline shift 6 to 7 mm  CT repeat stable hematoma and MLS at 35mm  On 3% saline -> off now   Now on 1/2 NS   Na 137->138->138->157->155->162->161  Sodium goal 150-160  Sodium every 6 hours  Central Line placed 07/11/19  Dysphagia  Due to [place] and aphasia  Speech on board  N.p.o.  Continue IV fluid  Cortrak by fluoro   Will start TF and FW after po access  Hypertension  Home BP meds: none   Current BP meds: Cleviprex  Stable  Will start p.o. BP meds once p.o. access . SBP goal < 160 mm Hg . Long-term BP goal  normotensive  Hyperlipidemia  Home Lipid lowering medication: none   LDL 156, goal < 70  Current lipid lowering medication: None for now  Consider statin on discharge  Other Stroke Risk Factors  Former cigarette smoker - quit  ETOH use, advised to drink no more than 1 alcoholic beverage per day.  Migraines  Other Active Problems  Possible small meningioma. Small volume subdural hematoma less likely. Follow-up post-contrast brain MRI would be necessary to confirm.  Hypokalemia - 2.9 ->3.4 - IVsupplement   Mild Leukocytosis - 8.1->10.9->8.7- monitor   Hospital day # 4 This patient is critically ill and at significant risk of neurological worsening, death and care requires constant monitoring of vital signs, hemodynamics,respiratory and cardiac monitoring, extensive review of multiple databases, frequent neurological assessment, discussion with family, other specialists and medical decision making of high complexity. I spent 35 minutes of neurocritical care time  in the care of  this patient.   Rosalin Hawking, MD PhD Stroke Neurology 07/13/2019 11:38 AM   To contact Stroke Continuity provider, please refer to [email or web address] After hours, contact General Neurology

## 2019-07-13 NOTE — Progress Notes (Signed)
Text page sent to Dr Leonel Ramsay. Informed md pt with episode of svt rate 170s, potassium level 3.1, magnesium level 2.3, sodium level 162. Will give peripheral IV runs of potassium per md order.

## 2019-07-13 NOTE — Progress Notes (Signed)
Assisted tele visit to patient with family member.  Melanie Madden M Jaramie Bastos, RN  

## 2019-07-13 NOTE — Progress Notes (Signed)
  Speech Language Pathology Treatment: Dysphagia;Cognitive-Linquistic  Patient Details Name: Melanie Madden MRN: [id number] DOB: 1966/01/19 Today's Date: 07/13/2019 Time: [id number] SLP Time Calculation (min) (ACUTE ONLY): 17 min  Assessment / Plan / Recommendation Clinical Impression  Pt was encountered awake/alert sitting upright in chair following PT.  Treatment focused on dysphagia and expressive/receptive language deficits.  Pt with good eye contact upon SLP arrival; however, she did not attempt to vocalize throughout this tx session despite max verbal, tactile, and visual cues.  Pt was unable to answer yes/no questions via verbalization, eye blink, or hand squeeze.  She followed 1/5 basic one step commands given max visual cues and she was able to open her mouth when a spoon or Y[place]auer was presented.     SLP completed oral care prior to po trials.  Pt was observed with trials of small ice chips x2, thin liquid via 1/2 tsp and straw pipette, and 1/2 tsp of puree x2.  Pt with fairly good bolus acceptance; however, she exhibited difficulty with labial closure around the spoon.  She presented with decreased lingual manipulation of the bolus, decreased bolus cohesion, and prolonged AP transport.  She did not attempt a swallow initiation with either ice chip trials and they were subsequently suctioned from her oral cavity.  Swallow initiation was achieved with remaining trials; however, suspect that it was delayed.  No cough or throat clear was observed with any trials; however, unable to determine vocal quality secondary to pt being unable to vocalize.  Pt was observed to have anterior labial spillage on the R side as well as residue in the R buccal cavity following all trials.  Recommend that pt remain NPO at this time with consideration of short-term alternative means of nutrition.  Pt may have a few small sips of water via spoon following thorough oral care to mobilize swallow function.  Hopeful that pt  will be able to tolerate an instrumental swallow study in the next few days.  SLP will continue to f/u per POC.      HPI HPI: 53 y.o. female who presents with right-sided weakness that started abruptly after returning home from work.  Her husband states that she got home around 6 PM, and was relatively normal at that time.  She subsequently stated that she was not feeling well and sat down.  Her mental status then progressively declined and he called 911.  En route, code stroke was activated due to right-sided hemiparesis and after arrival she was taken for CT scan which reveals a large basal ganglia hemorrhage on the left.      SLP Plan  Continue with current plan of care       Recommendations  Diet recommendations: NPO(with temporary alternative means of nutrition ) Medication Administration: Via alternative means                Oral Care Recommendations: Oral care QID;Staff/trained caregiver to provide oral care Follow up Recommendations: Inpatient Rehab SLP Visit Diagnosis: Dysphagia, oropharyngeal phase (R13.12);Aphasia (R47.01) Attention and concentration deficit following: Nontraumatic intracerebral hemorrhage Plan: Continue with current plan of care       GO               Colin Mulders., M.S., [place] Office: [phone]  [place] 07/13/2019, 11:06 AM

## 2019-07-13 NOTE — Evaluation (Signed)
Occupational Therapy Evaluation Patient Details Name: Melanie Madden MRN: [id number] DOB: 11-Jul-1966 Today's Date: 07/13/2019    History of Present Illness Pt adm with rt sided weakness and AMS. 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 PMH - migraines and anxiety   Clinical Impression   This 53 yo female admitted with above presents to acute with decreased vision, flaccid right side, decreased attention to right side, decreased sitting balance (with pushing), expressive and receptive difficulties all affecting her PLOF of being completely independent with all basic ADLs and working. She will benefit from acute OT with follow up on CIR.     Follow Up Recommendations  CIR;Supervision/Assistance - 24 hour    Equipment Recommendations  Other (comment)(TBD at next venue)       Precautions / Restrictions Precautions Precautions: Fall;Other (comment) Precaution Comments: pusher Restrictions Weight Bearing Restrictions: No      Mobility Bed Mobility Overal bed mobility: Needs Assistance Bed Mobility: Supine to Sit     Supine to sit: +2 for physical assistance;Max assist;Total assist     General bed mobility comments: Assist to bring legs off of bed, elevate trunk into sitting, and bring hips to EOB. Pt trying to assist with LUE.   Transfers Overall transfer level: Needs assistance               General transfer comment: Pt with heavy pushing with LUE and and unable to safely attempt transfer without lift due to pushing and multiple lines/tubes    Balance Overall balance assessment: Needs assistance Sitting-balance support: Single extremity supported;Feet supported Sitting balance-Leahy Scale: Zero Sitting balance - Comments: Pt with heavy pushing with LUE toward rt requiring max assist to maintain EOB. Attempted to have pt prop on Lt forearm to reduce pushing but pt continuing to push Postural control: Right lateral lean                                  ADL either performed or assessed with clinical judgement   ADL Overall ADL's : Needs assistance/impaired Eating/Feeding: NPO     Grooming Details (indicate cue type and reason): Washed both sides of face/mouth but not forehead when washcloth touched to her face and then placed in her left hand; when comb place in left hand she did not attempt to comb hair even after combing it for her a little and placing comb back in her hand Upper Body Bathing: Total assistance;Sitting   Lower Body Bathing: Total assistance;Bed level   Upper Body Dressing : Maximal assistance;Sitting   Lower Body Dressing: Total assistance;Bed level                       Vision   Additional Comments: Prior vision unknown. When you can get pateint to turn head and eyes all the way right she closes left eye (suspect double vision), pt with tendency to look midline and to left            Pertinent Vitals/Pain Pain Assessment: Faces Faces Pain Scale: No hurt     Hand Dominance  uknown   Extremity/Trunk Assessment Upper Extremity Assessment Upper Extremity Assessment: RUE deficits/detail RUE Deficits / Details: Flaccid; she did intermittently regard it   Lower Extremity Assessment Lower Extremity Assessment: RLE deficits/detail RLE Deficits / Details: Strength 0/5. PROM WFL. Pt trying to use LLE to lift RLE       Communication Communication Communication:  Receptive difficulties;Expressive difficulties   Cognition Arousal/Alertness: Awake/alert Behavior During Therapy: Restless Overall Cognitive Status: Difficult to assess                                 General Comments: performing some actions to visual cues like lifting foot to have sock put on but not following direct verbal commands; when touched washcloth to her face and then placed in left hand she did wash both sides of her face but not forehead; did not do anything with comb when placed in her hand               Home Living Family/patient expects to be discharged to:: Private residence Living Arrangements: Spouse/significant other                               Additional Comments: Unable to get details of home due to aphasia  Lives With: Spouse    Prior Functioning/Environment Level of Independence: Independent        Comments: works        OT Problem List: Decreased strength;Decreased range of motion;Impaired balance (sitting and/or standing);Impaired vision/perception;Decreased coordination;Decreased cognition;Decreased safety awareness;Impaired sensation;Impaired UE functional use      OT Treatment/Interventions: Self-care/ADL training;DME and/or AE instruction;Patient/family education;Balance training;Visual/perceptual remediation/compensation;Cognitive remediation/compensation;Therapeutic activities;Therapeutic exercise    OT Goals(Current goals can be found in the care plan section) Acute Rehab OT Goals Patient Stated Goal: unable to state OT Goal Formulation: Patient unable to participate in goal setting Time For Goal Achievement: 07/27/19 Potential to Achieve Goals: Good  OT Frequency: Min 3X/week           Co-evaluation PT/OT/SLP Co-Evaluation/Treatment: Yes Reason for Co-Treatment: Complexity of the patient's impairments (multi-system involvement);For patient/therapist safety;To address functional/ADL transfers PT goals addressed during session: Mobility/safety with mobility;Balance OT goals addressed during session: ADL's and self-care      AM-PAC OT "6 Clicks" Daily Activity     Outcome Measure Help from another person eating meals?: Total Help from another person taking care of personal grooming?: A Lot Help from another person toileting, which includes using toliet, bedpan, or urinal?: Total Help from another person bathing (including washing, rinsing, drying)?: A Lot Help from another person to put on and taking off regular upper body clothing?:  Total Help from another person to put on and taking off regular lower body clothing?: Total 6 Click Score: 8   End of Session Nurse Communication: Mobility status;Need for lift equipment  Activity Tolerance: Patient tolerated treatment well Patient left: in chair;with call bell/phone within reach;with chair alarm set(lift pad under her)  OT Visit Diagnosis: Other abnormalities of gait and mobility (R26.89);Muscle weakness (generalized) (M62.81);Other symptoms and signs involving cognitive function;Hemiplegia and hemiparesis;Cognitive communication deficit (R41.841);Low vision, both eyes (H54.2) Symptoms and signs involving cognitive functions: Cerebral infarction Hemiplegia - Right/Left: Right Hemiplegia - dominant/non-dominant: Dominant Hemiplegia - caused by: Cerebral infarction                Time: [id number] OT Time Calculation (min): 29 min Charges:  OT General Charges $OT Visit: 1 Visit OT Evaluation $OT Eval Moderate Complexity: 1 Mod  [place] [address], OTR/L Acute [employer] Pager [phone] Office [phone]     Almon Register 07/13/2019, 1:00 PM

## 2019-07-14 LAB — BASIC METABOLIC PANEL
Anion gap: 10 (ref 5–15)
BUN: 31 mg/dL — ABNORMAL HIGH (ref 6–20)
CO2: 25 mmol/L (ref 22–32)
Calcium: 9.9 mg/dL (ref 8.9–10.3)
Chloride: 125 mmol/L — ABNORMAL HIGH (ref 98–111)
Creatinine, Ser: 0.65 mg/dL (ref 0.44–1.00)
GFR calc Af Amer: >60 mL/min (ref >60)
GFR calc non Af Amer: >60 mL/min (ref >60)
Glucose, Bld: 153 mg/dL — ABNORMAL HIGH (ref 70–99)
Potassium: 3.5 mmol/L (ref 3.5–5.1)
Sodium: 160 mmol/L — ABNORMAL HIGH (ref 135–145)

## 2019-07-14 LAB — CBC
HCT: 47.3 % — ABNORMAL HIGH (ref 36.0–46.0)
Hemoglobin: 15.1 g/dL — ABNORMAL HIGH (ref 12.0–15.0)
MCH: 30.4 pg (ref 26.0–34.0)
MCHC: 31.9 g/dL (ref 30.0–36.0)
MCV: 95.4 fL (ref 80.0–100.0)
Platelets: 162 10*3/uL (ref 150–400)
RBC: 4.96 MIL/uL (ref 3.87–5.11)
RDW: 14.3 % (ref 11.5–15.5)
WBC: 9.8 10*3/uL (ref 4.0–10.5)
nRBC: 0 % (ref 0.0–0.2)

## 2019-07-14 LAB — GLUCOSE, CAPILLARY
Glucose-Capillary: 126 mg/dL — ABNORMAL HIGH (ref 70–99)
Glucose-Capillary: 131 mg/dL — ABNORMAL HIGH (ref 70–99)
Glucose-Capillary: 149 mg/dL — ABNORMAL HIGH (ref 70–99)
Glucose-Capillary: 70 mg/dL (ref 70–99)

## 2019-07-14 LAB — SODIUM
Sodium: 161 mmol/L (ref 135–145)
Sodium: 156 mmol/L — ABNORMAL HIGH (ref 135–145)

## 2019-07-14 LAB — PHOSPHORUS
Phosphorus: 3.8 mg/dL (ref 2.5–4.6)
Phosphorus: 2.9 mg/dL (ref 2.5–4.6)

## 2019-07-14 LAB — MAGNESIUM
Magnesium: 2.4 mg/dL (ref 1.7–2.4)
Magnesium: 2.2 mg/dL (ref 1.7–2.4)

## 2019-07-14 LAB — TRIGLYCERIDES: Triglycerides: 166 mg/dL — ABNORMAL HIGH (ref <150)

## 2019-07-14 MED ORDER — FREE WATER
250.0000 mL | Status: DC
  Administered 2019-07-14 – 2019-07-15 (×8): 250 mL

## 2019-07-14 MED ORDER — FREE WATER
200.0000 mL | Status: DC
  Administered 2019-07-14 (×2): 200 mL

## 2019-07-14 MED ORDER — SODIUM CHLORIDE 0.45 % IV SOLN: INTRAVENOUS | Status: DC

## 2019-07-14 MED ORDER — LISINOPRIL 20 MG PO TABS
20.0000 mg | ORAL_TABLET | Freq: Two times a day (BID) | ORAL | Status: DC
  Administered 2019-07-14 – 2019-07-15 (×3): 20 mg
  Filled 2019-07-14 (×3): qty 1

## 2019-07-14 MED ORDER — LABETALOL HCL 5 MG/ML IV SOLN: 10.0000 mg | INTRAVENOUS | Status: DC | PRN

## 2019-07-14 MED ORDER — HEPARIN SODIUM (PORCINE) 5000 UNIT/ML IJ SOLN
5000.0000 [IU] | Freq: Three times a day (TID) | INTRAMUSCULAR | Status: DC
  Administered 2019-07-14 – 2019-07-18 (×12): 5000 [IU] via SUBCUTANEOUS
  Filled 2019-07-14 (×12): qty 1

## 2019-07-14 MED ORDER — OSMOLITE 1.5 CAL PO LIQD
1000.0000 mL | ORAL | Status: DC
  Administered 2019-07-15: 1000 mL
  Filled 2019-07-14: qty 1000

## 2019-07-14 MED ORDER — VITAL HIGH PROTEIN PO LIQD: 1000.0000 mL | ORAL | Status: DC

## 2019-07-14 MED ORDER — POTASSIUM CHLORIDE 20 MEQ/15ML (10%) PO SOLN
40.0000 meq | Freq: Once | ORAL | Status: AC
  Administered 2019-07-14: 40 meq via ORAL
  Filled 2019-07-14: qty 30

## 2019-07-14 MED ORDER — ENSURE ENLIVE PO LIQD
237.0000 mL | Freq: Two times a day (BID) | ORAL | Status: DC
  Administered 2019-07-15 – 2019-07-18 (×7): 237 mL via ORAL

## 2019-07-14 NOTE — Progress Notes (Signed)
 Initial Nutrition Assessment  DOCUMENTATION CODES:   Not applicable  INTERVENTION:   Tube Feeding:  Change to Nocturnal TF Osmolite 1.5 at 65 ml/hr x 12 hours (840 mL) Pro-Stat 30 mL BID Provides 1370 kcals, 79 g of protein and 593 mL of free water Meets 80% calorie needs, >90% protein needs Monitor po intake and make further recommendations regarding TF at that time  Ensure Enlive po BID, each supplement provides 350 kcal and 20 grams of protein   NUTRITION DIAGNOSIS:   Inadequate oral intake related to acute illness as evidenced by NPO status.  GOAL:   Patient will meet greater than or equal to 90% of their needs  MONITOR:   PO intake, Supplement acceptance, Labs, Weight trends  REASON FOR ASSESSMENT:   Consult Enteral/tube feeding initiation and management  ASSESSMENT:   53 yo female admitted with ICH with HTN emergency. PMH includes HTN  12/14 10 fr small bore feeding tube, TF started 12/15 Diet advanced to Dysphagia 2, Thins  Hypernatremic but was on 3%NS previously  Vital High Protein at 40 ml/hr via NG tube (ordered yesterday by MD per Tube Feeding Protocol) Free water 250 mL q 3 hours  Labs: sodium 156, Creatinine wdl Meds: reviewed   Diet Order:   Diet Order            DIET DYS 2 Room service appropriate? Yes; Fluid consistency: Thin  Diet effective now              EDUCATION NEEDS:   Not appropriate for education at this time  Skin:  Skin Assessment: Reviewed RN Assessment  Last BM:  12/15  Height:   Ht Readings from Last 1 Encounters:  07/17/18 5\' 7"  (1.702 m)    Weight:   Wt Readings from Last 1 Encounters:  07/14/19 56 kg    Ideal Body Weight:     BMI:  Body mass index is 19.34 kg/m.  Estimated Nutritional Needs:   Kcal:  1680-1850 kcals  Protein:  84-93 g  Fluid:  >/= 1.7 L     Tilak Oakley MS, RDN, LDN, CNSC [phone] Pager  [phone] Weekend/On-Call Pager

## 2019-07-14 NOTE — Progress Notes (Signed)
Inpatient Rehabilitation Admissions Coordinator Inpatient rehab consult received. I await further progress with therapy to assist with planning venue options.  Danne Baxter, RN, MSN Rehab Admissions Coordinator [phone] 07/14/2019 12:36 PM

## 2019-07-14 NOTE — Progress Notes (Signed)
Physical Therapy Treatment Patient Details Name: Melanie Madden MRN: [id number] DOB: [birth year] Today's Date: 07/14/2019    History of Present Illness Pt adm with rt sided weakness and AMS. 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 PMH - migraines and anxiety    PT Comments    Pt making steady progress. Continues to have multiple areas of impairments. Will be helpful when all lines/tubes off so can focus on more functional task rather than preventing pt from pulling out lines/tubes.    Follow Up Recommendations  CIR;Supervision/Assistance - 24 hour     Equipment Recommendations  Other (comment)(To be determined at next venue)    Recommendations for Other Services       Precautions / Restrictions Precautions Precautions: Fall;Other (comment) Precaution Comments: pusher Restrictions Weight Bearing Restrictions: No    Mobility  Bed Mobility Overal bed mobility: Needs Assistance Bed Mobility: Supine to Sit;Sit to Supine     Supine to sit: +2 for physical assistance;Max assist;Total assist     General bed mobility comments: Assist to bring legs off of bed, elevate trunk into sitting, and bring hips to EOB. Pt trying to assist with LUE.   Transfers                 General transfer comment: Deferred due to pt to move to different room.  Ambulation/Gait                 Stairs             Wheelchair Mobility    Modified Rankin (Stroke Patients Only)       Balance Overall balance assessment: Needs assistance Sitting-balance support: Single extremity supported;Feet supported Sitting balance-Leahy Scale: Zero Sitting balance - Comments: Pt with heavy pushing with LUE toward rt. Initially requiring max assist to maintain sitting EOB. Placed bedside table in front of pt to prop forearms but pt just leaned head forward on table. Removed table and sat in front of pt. Had pt prop on lt elbow and then returned pt to midline with improvement in sitting  balance requiring only min assist for 30-40 sec before pushing again. Postural control: Right lateral lean                                  Cognition Arousal/Alertness: Awake/alert Behavior During Therapy: Restless Overall Cognitive Status: Difficult to assess                                 General Comments: performing some actions to visual/tactile cues like partially bridging to readjust pads but not following direct verbal commands      Exercises      General Comments        Pertinent Vitals/Pain Pain Assessment: Faces Faces Pain Scale: No hurt    Home Living                      Prior Function            PT Goals (current goals can now be found in the care plan section) Acute Rehab PT Goals Patient Stated Goal: unable to state Progress towards PT goals: Progressing toward goals    Frequency    Min 4X/week      PT Plan Current plan remains appropriate    Co-evaluation  AM-PAC PT "6 Clicks" Mobility   Outcome Measure  Help needed turning from your back to your side while in a flat bed without using bedrails?: Total Help needed moving from lying on your back to sitting on the side of a flat bed without using bedrails?: Total Help needed moving to and from a bed to a chair (including a wheelchair)?: Total Help needed standing up from a chair using your arms (e.g., wheelchair or bedside chair)?: Total Help needed to walk in hospital room?: Total Help needed climbing 3-5 steps with a railing? : Total 6 Click Score: 6    End of Session   Activity Tolerance: Patient tolerated treatment well Patient left: with call bell/phone within reach;in bed;Other (comment)(with SLP present) Nurse Communication: Mobility status;Need for lift equipment PT Visit Diagnosis: Other abnormalities of gait and mobility (R26.89);Hemiplegia and hemiparesis Hemiplegia - Right/Left: Right Hemiplegia - dominant/non-dominant:  Dominant Hemiplegia - caused by: Nontraumatic intracerebral hemorrhage     Time: 0920-0950 PT Time Calculation (min) (ACUTE ONLY): 30 min  Charges:  $Neuromuscular Re-education: 23-37 mins                     [place] Pager [phone] Office [place] 07/14/2019, 3:54 PM

## 2019-07-14 NOTE — Progress Notes (Signed)
Pt arrived to 3W07. Alert to self. Unable to follow commands. Patient comfortable in bed. Call bell within reach. Nurse will continue to monitor.

## 2019-07-14 NOTE — Progress Notes (Signed)
Speech Language Pathology Treatment: Dysphagia;Cognitive-Linquistic  Patient Details Name: Melanie Madden MRN: [id number] DOB: October 20, 1965 Today's Date: 07/14/2019 Time:  -     Assessment / Plan / Recommendation Clinical Impression  Beautiful progress achieved today; Pt found great success when allowed to proceed through motivating functional tasks. Needed min to max physical assist at times to initiate motor movement, but eventually moved into automaticity with most tasks attempted.  After pt was released from restraint, with visual and gestural cues and well as physical assist to L elbow to initiate movement pt was able to recognize toothbrush, brush her teeth bilaterally, grasp a cup and initiate sips, find her RUE and recognize its lack of movement and switch to feeding with LUE, follow gestural cues to put her own restraint back on with assist, put a phone to her ear, follow visual and verbal cues to phonate and change intonation during model of "Hello" Bye bye, I love you and happy birthday while in the phone with her husband. 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 Pt very comically played keep away from me with my own phone, holding it out of my reach in both visual fields.  She does seem to have a visual field cut, she often closes one eye, but when I physically covered one of her eyes she pulled my hand away. Suspect that with decreased physical restraint and increased opportunity for activity pt could make some great achievements. Strongly recommend CIR, pt needs highly skilled interventions with max intensity/frequency.   Regarding swallowing, pts poor motor coordination required at first hand over hand assist and some training to relearn straw sipping. After several trials pt still struggled to cease sipping, causing a few coughing episodes. However, when given total  assist to pull straw away, no coughing occurred and pt was able to take single sips by the end of session quite automatically. She can also masticate but needs extra time but clearly benefit from the opportunity to masticate. She had no trouble with total assist spoon feeding puree. Will start a finely chopped diet with thin liquids and f/u tomorrow to assist pt with meals   HPI HPI: 53 y.o. female who presents with right-sided weakness that started abruptly after returning home from work.  Her husband states that she got home around 6 PM, and was relatively normal at that time.  She subsequently stated that she was not feeling well and sat down.  Her mental status then progressively declined and he called 911.  En route, code stroke was activated due to right-sided hemiparesis and after arrival she was taken for CT scan which reveals a large basal ganglia hemorrhage on the left.      SLP Plan  Continue with current plan of care       Recommendations  Diet recommendations: Thin liquid;Dysphagia 2 (fine chop) Liquids provided via: Straw Supervision: Full supervision/cueing for compensatory strategies;Staff to assist with self feeding Compensations: Slow rate;Small sips/bites;Minimize environmental distractions Postural Changes and/or Swallow Maneuvers: Seated upright 90 degrees;Upright 30-60 min after meal                Plan: Continue with current plan of care       GO               Herbie Baltimore, MA [place] Pager [phone] Office [phone]  Lynann Beaver 07/14/2019, 12:37 PM

## 2019-07-15 DIAGNOSIS — E782 Mixed hyperlipidemia: Secondary | ICD-10-CM

## 2019-07-15 LAB — CBC
HCT: 45.5 % (ref 36.0–46.0)
Hemoglobin: 14.5 g/dL (ref 12.0–15.0)
MCH: 29.8 pg (ref 26.0–34.0)
MCHC: 31.9 g/dL (ref 30.0–36.0)
MCV: 93.6 fL (ref 80.0–100.0)
Platelets: 147 10*3/uL — ABNORMAL LOW (ref 150–400)
RBC: 4.86 MIL/uL (ref 3.87–5.11)
RDW: 13.6 % (ref 11.5–15.5)
WBC: 6.7 10*3/uL (ref 4.0–10.5)
nRBC: 0 % (ref 0.0–0.2)

## 2019-07-15 LAB — BASIC METABOLIC PANEL
Anion gap: 7 (ref 5–15)
BUN: 23 mg/dL — ABNORMAL HIGH (ref 6–20)
CO2: 27 mmol/L (ref 22–32)
Calcium: 9.5 mg/dL (ref 8.9–10.3)
Chloride: 118 mmol/L — ABNORMAL HIGH (ref 98–111)
Creatinine, Ser: 0.53 mg/dL (ref 0.44–1.00)
GFR calc Af Amer: >60 mL/min (ref >60)
GFR calc non Af Amer: >60 mL/min (ref >60)
Glucose, Bld: 239 mg/dL — ABNORMAL HIGH (ref 70–99)
Potassium: 3.4 mmol/L — ABNORMAL LOW (ref 3.5–5.1)
Sodium: 152 mmol/L — ABNORMAL HIGH (ref 135–145)

## 2019-07-15 LAB — GLUCOSE, CAPILLARY
Glucose-Capillary: 201 mg/dL — ABNORMAL HIGH (ref 70–99)
Glucose-Capillary: 149 mg/dL — ABNORMAL HIGH (ref 70–99)
Glucose-Capillary: 170 mg/dL — ABNORMAL HIGH (ref 70–99)
Glucose-Capillary: 118 mg/dL — ABNORMAL HIGH (ref 70–99)

## 2019-07-15 MED ORDER — RESOURCE THICKENUP CLEAR PO POWD
ORAL | Status: DC | PRN
  Filled 2019-07-15: qty 125

## 2019-07-15 MED ORDER — INSULIN ASPART 100 UNIT/ML ~~LOC~~ SOLN
0.0000 [IU] | Freq: Three times a day (TID) | SUBCUTANEOUS | Status: DC
  Administered 2019-07-16: 3 [IU] via SUBCUTANEOUS
  Administered 2019-07-16 – 2019-07-18 (×4): 1 [IU] via SUBCUTANEOUS

## 2019-07-15 MED ORDER — FREE WATER: 200.0000 mL | Freq: Four times a day (QID) | Status: DC

## 2019-07-15 MED ORDER — LISINOPRIL 20 MG PO TABS
20.0000 mg | ORAL_TABLET | Freq: Two times a day (BID) | ORAL | Status: DC
  Administered 2019-07-15 – 2019-07-18 (×6): 20 mg via ORAL
  Filled 2019-07-15 (×6): qty 1

## 2019-07-15 NOTE — Progress Notes (Signed)
  Speech Language Pathology Treatment: Dysphagia;Cognitive-Linquistic  Patient Details Name: Melanie Madden MRN: [id number] DOB: 1966/06/26 Today's Date: 07/15/2019 Time: [id number] SLP Time Calculation (min) (ACUTE ONLY): 72 min  Assessment / Plan / Recommendation Clinical Impression  Pt seen right after CIR admission Coordinator visited. With repositioning and oral care pt fully awake. Pt was more resistant to tactile cueing today after restraint removal. She is visibly frustrated by restraint and thirsty and responded with resistance to hand over hand assist today from SLP and husband and could not problem solve most basic functional tasks due to poorly sustained attention. She did participate in brushing teeth, self feeding from a straw, self feeding with a spoon. SLP could feel minimal RUE activation during functional feeding task. Pt needed more assistance to slow rate of intake to a single sip today, did not participate as well with visual and gestural cues as yesterday. Coughing consistently occurred with rapid consecutive intake. Will change to nectar thick liquids to slow bolus flow and reduce coughing. Oral intake has been considerable when assist from SLP or spouse is given.   Pt also able to repeat Hello, and count to 10 with severely impaired articulation. She can achieve lip rounding with a model, but otherwise can only achieve accurate intonation and syllable count with simple words. Used husband on the phone to cue her, as well as Pharmacist, hospital.     HPI HPI: 53 y.o. female who presents with right-sided weakness that started abruptly after returning home from work.  Her husband states that she got home around 6 PM, and was relatively normal at that time.  She subsequently stated that she was not feeling well and sat down.  Her mental status then progressively declined and he called 911.  En route, code stroke was activated due to right-sided hemiparesis and after arrival she was taken  for CT scan which reveals a large basal ganglia hemorrhage on the left.      SLP Plan  Continue with current plan of care       Recommendations  Diet recommendations: Nectar-thick liquid;Dysphagia 2 (fine chop) Liquids provided via: Straw Medication Administration: Crushed with puree Supervision: Staff to assist with self feeding;Full supervision/cueing for compensatory strategies Compensations: Slow rate;Small sips/bites;Minimize environmental distractions Postural Changes and/or Swallow Maneuvers: Seated upright 90 degrees;Upright 30-60 min after meal                General recommendations: Rehab consult Follow up Recommendations: Inpatient Rehab SLP Visit Diagnosis: Dysphagia, oropharyngeal phase (R13.12);Aphasia (R47.01) Attention and concentration deficit following: Nontraumatic intracerebral hemorrhage Plan: Continue with current plan of care       GO               Herbie Baltimore, [employer] Pager [phone] Office [phone]  Melanie Madden 07/15/2019, 1:46 PM

## 2019-07-15 NOTE — Progress Notes (Signed)
Physical Therapy Treatment Patient Details Name: Melanie Madden MRN: [id number] DOB: 1965/10/11 Today's Date: 07/15/2019    History of Present Illness Pt adm with rt sided weakness and AMS. 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 PMH - migraines and anxiety    PT Comments    Pt more agitated/restless today, likely due to being received with urinary and bowel incontinence and attempting to get out of bed and pull at lines and leads. Session focused on bed mobility to provide peri care and gown/linen change. Also focused on right upper extremity PROM and functional reaching for trunk control. Pt continues with right hemiparesis, balance impairments, decreased cognition and safety awareness. Remains great candidate for CIR program based on PLOF, family support, age.   Follow Up Recommendations  CIR;Supervision/Assistance - 24 hour     Equipment Recommendations  Other (comment)(To be determined at next venue)    Recommendations for Other Services       Precautions / Restrictions Precautions Precautions: Fall;Other (comment) Precaution Comments: pusher Restrictions Weight Bearing Restrictions: No    Mobility  Bed Mobility Overal bed mobility: Needs Assistance Bed Mobility: Rolling Rolling: Max assist         General bed mobility comments: Pt needing maxA to roll to left and right including facilitation of head/neck, trunk rotation. More limited by agitation/restlessness and decreased cognition rather than true ability. once bed was placed in chair position, pt with heavy right lateral lean and decreased trunk control, requiring totalA to correct  Transfers                 General transfer comment: deferred  Ambulation/Gait                 Stairs             Wheelchair Mobility    Modified Rankin (Stroke Patients Only) Modified Rankin (Stroke Patients Only) Pre-Morbid Rankin Score: No symptoms Modified Rankin: Severe disability     Balance  Overall balance assessment: Needs assistance Sitting-balance support: Single extremity supported Sitting balance-Leahy Scale: Zero                                      Cognition Arousal/Alertness: Awake/alert Behavior During Therapy: Restless Overall Cognitive Status: Impaired/Different from baseline Area of Impairment: Awareness;Safety/judgement;Following commands                       Following Commands: Follows one step commands inconsistently Safety/Judgement: Decreased awareness of safety;Decreased awareness of deficits Awareness: Intellectual   General Comments: Pt following limited commands with tactile cues. Restless as she was soiled upon entry, actively attempting to get out of bed, pull at lines/leads including Cortrak.      Exercises Other Exercises Other Exercises: Functional reaching with LUE with bed in chair position to promote trunk/core control Other Exercises: PROM RUE D1, shoulder flexion, elbow flexion x 10 each with bed in chair position Other Exercises: PROM RLE hip/knee flexion x 10 each with bed in chair position    General Comments        Pertinent Vitals/Pain Pain Assessment: Faces Faces Pain Scale: No hurt    Home Living                      Prior Function            PT Goals (current goals can now be  found in the care plan section) Acute Rehab PT Goals Patient Stated Goal: unable to state Potential to Achieve Goals: Fair    Frequency    Min 4X/week      PT Plan Current plan remains appropriate    Co-evaluation              AM-PAC PT "6 Clicks" Mobility   Outcome Measure  Help needed turning from your back to your side while in a flat bed without using bedrails?: Total Help needed moving from lying on your back to sitting on the side of a flat bed without using bedrails?: Total Help needed moving to and from a bed to a chair (including a wheelchair)?: Total Help needed standing up from a  chair using your arms (e.g., wheelchair or bedside chair)?: Total Help needed to walk in hospital room?: Total Help needed climbing 3-5 steps with a railing? : Total 6 Click Score: 6    End of Session   Activity Tolerance: Treatment limited secondary to agitation Patient left: with call bell/phone within reach;in bed;with bed alarm set;with restraints reapplied;with family/visitor present Nurse Communication: Mobility status PT Visit Diagnosis: Other abnormalities of gait and mobility (R26.89);Hemiplegia and hemiparesis Hemiplegia - Right/Left: Right Hemiplegia - dominant/non-dominant: Dominant Hemiplegia - caused by: Nontraumatic intracerebral hemorrhage     Time: 1200-1232 PT Time Calculation (min) (ACUTE ONLY): 32 min  Charges:  $Therapeutic Exercise: 8-22 mins $Therapeutic Activity: 8-22 mins                     Ellamae Sia, PT, DPT Acute Rehabilitation Services Pager [phone] Office [phone]    Willy Eddy 07/15/2019, 3:02 PM

## 2019-07-15 NOTE — Progress Notes (Signed)
STROKE TEAM PROGRESS NOTE   INTERVAL HISTORY Pt sitting in bed. Awake alert, able to have sounds out, but still global aphasia. She ate well, TF was discontinued. BP under control with medication. Pending CIR   OBJECTIVE Vitals:   07/15/19 0613 07/15/19 0700 07/15/19 0801 07/15/19 1145  BP:  121/80 118/86 122/77  Pulse:      Resp:  15 16 17   Temp:   98.7 F (37.1 C) 97.7 F (36.5 C)  TempSrc:   Axillary Oral  SpO2:   99% 100%  Weight: 59.6 kg       CBC:  Recent Labs  Lab 07/09/19 1949 07/09/19 1956 07/14/19 0420 07/15/19 0600  WBC 8.1   < > 9.8 6.7  NEUTROABS 4.1  --   --   --   HGB 14.3  --  15.1* 14.5  HCT 41.6  --  47.3* 45.5  MCV 89.1   < > 95.4 93.6  PLT 191   < > 162 147*   < > = values in this interval not displayed.    Basic Metabolic Panel:  Recent Labs  Lab 07/14/19 0420 07/14/19 1127 07/14/19 1655 07/15/19 0600  NA 160* 156*  --  152*  K 3.5  --   --  3.4*  CL 125*  --   --  118*  CO2 25  --   --  27  GLUCOSE 153*  --   --  239*  BUN 31*  --   --  23*  CREATININE 0.65  --   --  0.53  CALCIUM 9.9  --   --  9.5  MG 2.4  --  2.2  --   PHOS 3.8  --  2.9  --     Lipid Panel:     Component Value Date/Time   CHOL 265 (H) 07/10/2019 1145   TRIG 166 (H) 07/14/2019 0420   HDL 81 07/10/2019 1145   CHOLHDL 3.3 07/10/2019 1145   VLDL 28 07/10/2019 1145   LDLCALC 156 (H) 07/10/2019 1145   HgbA1c:  Lab Results  Component Value Date   HGBA1C 5.3 07/10/2019   Urine Drug Screen:     Component Value Date/Time   LABOPIA NONE DETECTED 07/10/2019 1126   COCAINSCRNUR NONE DETECTED 07/10/2019 1126   LABBENZ NONE DETECTED 07/10/2019 1126   AMPHETMU NONE DETECTED 07/10/2019 1126   THCU NONE DETECTED 07/10/2019 1126   LABBARB NONE DETECTED 07/10/2019 1126    Alcohol Level No results found for: [facility]  IMAGING  CT HEAD CODE STROKE WO CONTRAST 07/09/2019 IMPRESSION:  1. Intraparenchymal hematoma with volume of 44 cc centered in the left basal ganglia,  most consistent with hypertensive hemorrhage.  2. 5 mm rightward midline shift.   MRI MRA Head 07/11/2019 IMPRESSION: 1. Large intra-axial hemorrhage in the left hemisphere centered at the left lentiform nucleus with estimated 53 mL volume appears not significantly changed in size since 07/09/2019. Superimposed restricted diffusion suggesting this is hemorrhagic transformation of an ischemic infarct. Surrounding edema and regional mass effect including mildly increased rightward midline shift of 6-7 mm. No ventricular trapping.  Basilar cisterns remain patent. 2. Trace intraventricular hemorrhage. No definite extra-axial extension of blood (although see #3). 3. Small indeterminate extra-axial collection or mass in the left prepontine cistern measuring 15 x 7 x 11 mm. Burtis Junes this is a small meningioma. Small volume subdural hematoma less likely. Follow-up post-contrast brain MRI would be necessary to confirm. 4. 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  CT HEAD WO CONTRAST 07/13/2019 1. Compared to brain MRI 2 days ago unchanged left cerebral hemorrhage with vasogenic edema and 6 mm of midline shift. 2. 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    Transthoracic Echocardiogram  07/10/2019 IMPRESSIONS  1. Left ventricular ejection fraction, by visual estimation, is 60 to 65%. The left ventricle has normal function. There is no left ventricular hypertrophy.  2. The left ventricle has no regional wall motion abnormalities.  3. Global right ventricle has normal systolic function.The right ventricular size is small. No increase in right ventricular wall thickness.  4. Left atrial size was normal.  5. Right atrial size was normal.  6. The mitral valve is normal in structure. No evidence of mitral valve regurgitation.  7. The tricuspid valve is normal in structure. Tricuspid valve regurgitation is trivial.  8. The aortic valve is normal in structure. Aortic  valve regurgitation is not visualized.  9. The pulmonic valve was normal in structure. Pulmonic valve regurgitation is not visualized. 10. The atrial septum is grossly normal.  ECG - SR rate 70 BPM. (See cardiology reading for complete details)   PHYSICAL EXAM   Blood pressure 122/77, pulse 80, temperature 97.7 F (36.5 C), temperature source Oral, resp. rate 17, weight 59.6 kg, SpO2 100 %.  General - Well nourished, well developed, not in acute distress.  Ophthalmologic - fundi not visualized due to noncooperation.  Cardiovascular - Regular rhythm and rate  Neuro - awake alert, nonverbal, but able to make more sounds out but not words yet, not following commands, global aphasia.  PERRL, left gaze preference, slightly cross midline, right neglect, but able to track object from left to right, not blinking to visual threat on the right.  Right facial droop, tongue protrusion not corporative.  Left upper and lower extremity spontaneous movement against gravity, purposeful.  Right upper extremity 0/5, flaccid.  Right lower extremity mild withdrawal to pain stimulation.  Decreased muscle tone on the right.  Babinski positive on the right. Sensation, coordination and gait not tested.   ASSESSMENT/PLAN Melanie Madden is a 53 y.o. female with history of migraines and anxiety presenting with right sided weakness and AMS. She did not receive IV t-PA due to [place].  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 5 mm rightward midline shift.   MRI head - Large intra-axial hemorrhage in the left hemisphere centered at the left lentiform nucleus with estimated 53 mL volume appears not significantly changed in size since 07/09/2019. Mildly increased rightward  midline shift of 6-7 mm.  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  CT head repeat 12/14 unchanged L cerebral hemorrhage w/ vasogenic edema and 45mm midline shift.  2D Echo - EF 60 - 65%. No cardiac source of emboli identified.   Sars Corona Virus 2 - negative  LDL - 156  HgbA1c - 5.3  UDS - neg  VTE prophylaxis - SCDs  No antithrombotic prior to admission, now on No antithrombotic  Therapy recommendations:  CIR  Disposition:  Pending   Medically ready for rehab when bed available  Cerebral edema  CT head showed midline shift 5 mm  MRI repeat showed midline shift 6 to 7 mm  CT repeat stable hematoma and MLS at 77mm  Central Line placed 07/11/19  On 3% saline -> off now   Na 137->138->138->157->155->162->161->160->156->152  Sodium daily  Dysphagia  Due to [place] and aphasia  Speech on board  Cortrak by flouro placed 12/14  Cleared for D2 thin liquid diet  TF and cortrak discontinued 12/16  Hypertension  Home BP meds: none   Off cleviprex  On lisinopril 20 bid, norvasc 10 . SBP goal < 160 mm Hg . Stable . Long-term BP goal normotensive  Hyperlipidemia  Home Lipid lowering medication: none   LDL 156, goal < 70  Current lipid lowering medication: None for now given hemorrhage   Consider statin on discharge  Other Stroke Risk Factors  Former cigarette smoker - quit  ETOH use, advised to drink no more than 1 alcoholic beverage per day.  Migraines  Other Active Problems  Possible small meningioma. Small volume subdural hematoma less likely. Follow-up post-contrast brain MRI would be necessary to confirm.  Hypokalemia - 2.9 ->3.4->3.5->3.4 - supplement  Mild Leukocytosis, resolved WBC 6.7 - monitor  Hospital day # 6    Rosalin Hawking, MD PhD Stroke Neurology 07/15/2019 11:54 AM   To contact Stroke Continuity provider, please refer to [email or web address] After hours, contact General Neurology

## 2019-07-15 NOTE — Progress Notes (Addendum)
Inpatient Rehabilitation Admissions Coordinator  I met at bedside with pt's spouse. Patient would open eyes with a lot of stimulation and would look to her husband, but then back to sleep. I await further progress with therapy and then will pursue [employer] approval for admit to CIR. We discussed goals and expectations of an inpt rehab admit.  Spouse to take FMLA and to hire assistance for when pt returns home. 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  Danne Baxter, RN, MSN Rehab Admissions Coordinator [phone] 07/15/2019 10:17 AM

## 2019-07-16 LAB — CBC
HCT: 44.9 % (ref 36.0–46.0)
Hemoglobin: 14.3 g/dL (ref 12.0–15.0)
MCH: 29.9 pg (ref 26.0–34.0)
MCHC: 31.8 g/dL (ref 30.0–36.0)
MCV: 93.9 fL (ref 80.0–100.0)
Platelets: 141 10*3/uL — ABNORMAL LOW (ref 150–400)
RBC: 4.78 MIL/uL (ref 3.87–5.11)
RDW: 13.4 % (ref 11.5–15.5)
WBC: 7.8 10*3/uL (ref 4.0–10.5)
nRBC: 0 % (ref 0.0–0.2)

## 2019-07-16 LAB — BASIC METABOLIC PANEL
Anion gap: 7 (ref 5–15)
BUN: 20 mg/dL (ref 6–20)
CO2: 27 mmol/L (ref 22–32)
Calcium: 9.6 mg/dL (ref 8.9–10.3)
Chloride: 114 mmol/L — ABNORMAL HIGH (ref 98–111)
Creatinine, Ser: 0.55 mg/dL (ref 0.44–1.00)
GFR calc Af Amer: >60 mL/min (ref >60)
GFR calc non Af Amer: >60 mL/min (ref >60)
Glucose, Bld: 126 mg/dL — ABNORMAL HIGH (ref 70–99)
Potassium: 4 mmol/L (ref 3.5–5.1)
Sodium: 148 mmol/L — ABNORMAL HIGH (ref 135–145)

## 2019-07-16 LAB — GLUCOSE, CAPILLARY
Glucose-Capillary: 120 mg/dL — ABNORMAL HIGH (ref 70–99)
Glucose-Capillary: 259 mg/dL — ABNORMAL HIGH (ref 70–99)
Glucose-Capillary: 160 mg/dL — ABNORMAL HIGH (ref 70–99)
Glucose-Capillary: 129 mg/dL — ABNORMAL HIGH (ref 70–99)

## 2019-07-16 MED ORDER — AMLODIPINE BESYLATE 10 MG PO TABS
10.0000 mg | ORAL_TABLET | Freq: Every day | ORAL | Status: DC
  Administered 2019-07-17 – 2019-07-18 (×2): 10 mg via ORAL
  Filled 2019-07-16 (×2): qty 1

## 2019-07-16 MED ORDER — SENNOSIDES-DOCUSATE SODIUM 8.6-50 MG PO TABS
1.0000 | ORAL_TABLET | Freq: Two times a day (BID) | ORAL | Status: DC
  Administered 2019-07-17 – 2019-07-18 (×3): 1 via ORAL
  Filled 2019-07-16 (×3): qty 1

## 2019-07-16 NOTE — Progress Notes (Signed)
Inpatient Rehabilitation Admissions Coordinator  Patient is awake and alert with spouse at bedside. Participative with adls. I await updates therapy notes form today and will seek insurance Auth with Christella Scheuermann for a possible inpt rehab admit.  Danne Baxter, RN, MSN Rehab Admissions Coordinator [phone] 07/16/2019 1:11 PM

## 2019-07-16 NOTE — Progress Notes (Signed)
Physical Therapy Treatment Patient Details Name: Melanie Madden MRN: [id number] DOB: July 13, 1966 Today's Date: 07/16/2019    History of Present Illness Pt adm with rt sided weakness and AMS. 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 PMH - migraines and anxiety    PT Comments    Pt supine in bed on arrival.  PTA cues patient to donn socks.  She attempted but required total assistance to start sock but then able to complete putting on both feet once started.  Husband present at bedside.  Pt able to come to sitting with moderate +2 assistance but once sitting edge of bed she lacks trunk control to achieve sitting balance.  Pt is very impulsive and eager to get out of bed.  To stand in sara stedy device she required +2 moderate assistance.  To maintain standing balance she required +2 max as she has a tendency to throw her weight forward.  Pt is very motivated but unable to verbalize needs.  Pt Continues to benefit from intesive physical therapy rehab to improve strength and function before returning home.  This is a woman who was fiercely independent and very motivated to regain function in this setting.  Pt smiling in recliner eating lunch post session.      CIR;Supervision/Assistance - 24 hour     Equipment Recommendations  Other (comment)(TBD at next venue)    Recommendations for Other Services       Precautions / Restrictions Precautions Precautions: Fall;Other (comment) Precaution Comments: pusher, right hemiparesis Restrictions Weight Bearing Restrictions: No    Mobility  Bed Mobility Overal bed mobility: Needs Assistance Bed Mobility: Rolling Rolling: Max assist   Supine to sit: Mod assist;+2 for physical assistance     General bed mobility comments: Mod +2 to come to sitting.  ONce in sitting she presents with poor balance and is unable to follow commands for posturing.  Pt required +2 max to total assistance for sitting balance.  Transfers Overall transfer level: Needs  assistance Equipment used: Ambulation equipment used(sara stedy) Transfers: Sit to/from Stand Sit to Stand: Mod assist;+2 physical assistance         General transfer comment: Mod +2 to boost into standing.  Once bottom up required increased assistance to maintain (max +2) to avoid lateral lean and anterior LOB.  Pt is very unpredictable in her movements.  Ambulation/Gait Ambulation/Gait assistance: (NT unable.)               Stairs             Wheelchair Mobility    Modified Rankin (Stroke Patients Only) Modified Rankin (Stroke Patients Only) Pre-Morbid Rankin Score: No symptoms Modified Rankin: Severe disability     Balance Overall balance assessment: Needs assistance   Sitting balance-Leahy Scale: Zero       Standing balance-Leahy Scale: Zero                              Cognition Arousal/Alertness: Awake/alert Behavior During Therapy: Restless;Impulsive Overall Cognitive Status: Impaired/Different from baseline Area of Impairment: Awareness;Safety/judgement;Following commands                       Following Commands: Follows one step commands inconsistently Safety/Judgement: Decreased awareness of safety;Decreased awareness of deficits Awareness: Intellectual   General Comments: Pt able to put both socks on with assistance.  She was able to follow commands to move to edge of bed but throws  her weight forward with pushing pattern.  She is impulsive to move out of bed but required increased tactile cueing to correct unsafe behaviors.  Husband present and assisted in mobilizing patient.  She remains unable to verbalize needs.      Exercises      General Comments        Pertinent Vitals/Pain Pain Assessment: Faces Faces Pain Scale: No hurt    Home Living                      Prior Function            PT Goals (current goals can now be found in the care plan section) Acute Rehab PT Goals Patient Stated Goal:  unable to state Potential to Achieve Goals: Fair Progress towards PT goals: Progressing toward goals    Frequency    Min 4X/week      PT Plan Current plan remains appropriate    Co-evaluation              AM-PAC PT "6 Clicks" Mobility   Outcome Measure  Help needed turning from your back to your side while in a flat bed without using bedrails?: Total Help needed moving from lying on your back to sitting on the side of a flat bed without using bedrails?: Total Help needed moving to and from a bed to a chair (including a wheelchair)?: Total Help needed standing up from a chair using your arms (e.g., wheelchair or bedside chair)?: Total Help needed to walk in hospital room?: Total Help needed climbing 3-5 steps with a railing? : Total 6 Click Score: 6    End of Session Equipment Utilized During Treatment: Gait belt Activity Tolerance: Treatment limited secondary to agitation Patient left: with call bell/phone within reach;with restraints reapplied;with family/visitor present;in chair;with chair alarm set Nurse Communication: Mobility status PT Visit Diagnosis: Other abnormalities of gait and mobility (R26.89);Hemiplegia and hemiparesis Hemiplegia - Right/Left: Right Hemiplegia - dominant/non-dominant: Dominant Hemiplegia - caused by: Nontraumatic intracerebral hemorrhage     Time: 1140-1211 PT Time Calculation (min) (ACUTE ONLY): 31 min  Charges:  $Therapeutic Activity: 23-37 mins                     Erasmo Leventhal , PTA Acute Rehabilitation Services Pager [phone] Office [phone]     Melanie Madden 07/16/2019, 2:23 PM

## 2019-07-16 NOTE — Progress Notes (Signed)
STROKE TEAM PROGRESS NOTE   INTERVAL HISTORY Husband at bedside. Pt sitting in chair, not following commands, not making any sounds today, not as interactive as yesterday. No significant neuro changes.    OBJECTIVE Vitals:   07/15/19 2008 07/15/19 2332 07/16/19 0433 07/16/19 0500  BP: 126/81 130/85 (!) 143/84   Pulse: (!) 101 65 78   Resp: 18 18 14    Temp: 99.6 F (37.6 C) (!) 97.5 F (36.4 C) 97.6 F (36.4 C)   TempSrc: Axillary Oral Oral   SpO2: 99% 94% 100%   Weight:    61.4 kg    CBC:  Recent Labs  Lab 07/09/19 1949 07/09/19 1956 07/15/19 0600 07/16/19 0500  WBC 8.1   < > 6.7 7.8  NEUTROABS 4.1  --   --   --   HGB 14.3  --  14.5 14.3  HCT 41.6  --  45.5 44.9  MCV 89.1   < > 93.6 93.9  PLT 191   < > 147* 141*   < > = values in this interval not displayed.    Basic Metabolic Panel:  Recent Labs  Lab 07/14/19 0420 07/14/19 0420 07/14/19 1655 07/15/19 0600 07/16/19 0500  NA 160*   < >  --  152* 148*  K 3.5  --   --  3.4* 4.0  CL 125*  --   --  118* 114*  CO2 25  --   --  27 27  GLUCOSE 153*  --   --  239* 126*  BUN 31*  --   --  23* 20  CREATININE 0.65  --   --  0.53 0.55  CALCIUM 9.9  --   --  9.5 9.6  MG 2.4  --  2.2  --   --   PHOS 3.8  --  2.9  --   --    < > = values in this interval not displayed.    Lipid Panel:     Component Value Date/Time   CHOL 265 (H) 07/10/2019 1145   TRIG 166 (H) 07/14/2019 0420   HDL 81 07/10/2019 1145   CHOLHDL 3.3 07/10/2019 1145   VLDL 28 07/10/2019 1145   LDLCALC 156 (H) 07/10/2019 1145   HgbA1c:  Lab Results  Component Value Date   HGBA1C 5.3 07/10/2019   Urine Drug Screen:     Component Value Date/Time   LABOPIA NONE DETECTED 07/10/2019 1126   COCAINSCRNUR NONE DETECTED 07/10/2019 1126   LABBENZ NONE DETECTED 07/10/2019 1126   AMPHETMU NONE DETECTED 07/10/2019 1126   THCU NONE DETECTED 07/10/2019 1126   LABBARB NONE DETECTED 07/10/2019 1126    Alcohol Level No results found for:  [facility]  IMAGING  CT HEAD CODE STROKE WO CONTRAST 07/09/2019 IMPRESSION:  1. Intraparenchymal hematoma with volume of 44 cc centered in the left basal ganglia, most consistent with hypertensive hemorrhage.  2. 5 mm rightward midline shift.   MRI MRA Head 07/11/2019 IMPRESSION: 1. Large intra-axial hemorrhage in the left hemisphere centered at the left lentiform nucleus with estimated 53 mL volume appears not significantly changed in size since 07/09/2019. Superimposed restricted diffusion suggesting this is hemorrhagic transformation of an ischemic infarct. Surrounding edema and regional mass effect including mildly increased rightward midline shift of 6-7 mm. No ventricular trapping.  Basilar cisterns remain patent. 2. Trace intraventricular hemorrhage. No definite extra-axial extension of blood (although see #3). 3. Small indeterminate extra-axial collection or mass in the left prepontine cistern measuring 15 x 7 x  11 mm. Burtis Junes this is a small meningioma. Small volume subdural hematoma less likely. Follow-up post-contrast brain MRI would be necessary to confirm. 4. 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  CT HEAD WO CONTRAST 07/13/2019 1. Compared to brain MRI 2 days ago unchanged left cerebral hemorrhage with vasogenic edema and 6 mm of midline shift. 2. 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    Transthoracic Echocardiogram  07/10/2019 IMPRESSIONS  1. Left ventricular ejection fraction, by visual estimation, is 60 to 65%. The left ventricle has normal function. There is no left ventricular hypertrophy.  2. The left ventricle has no regional wall motion abnormalities.  3. Global right ventricle has normal systolic function.The right ventricular size is small. No increase in right ventricular wall thickness.  4. Left atrial size was normal.  5. Right atrial size was normal.  6. The mitral valve is normal in structure. No evidence of mitral  valve regurgitation.  7. The tricuspid valve is normal in structure. Tricuspid valve regurgitation is trivial.  8. The aortic valve is normal in structure. Aortic valve regurgitation is not visualized.  9. The pulmonic valve was normal in structure. Pulmonic valve regurgitation is not visualized. 10. The atrial septum is grossly normal.  ECG - SR rate 70 BPM. (See cardiology reading for complete details)   PHYSICAL EXAM   Blood pressure (!) 143/84, pulse 78, temperature 97.6 F (36.4 C), temperature source Oral, resp. rate 14, weight 61.4 kg, SpO2 100 %.  General - Well nourished, well developed, not in acute distress.  Ophthalmologic - fundi not visualized due to noncooperation.  Cardiovascular - Regular rhythm and rate  Neuro - awake alert, nonverbal, did not make any sounds today, not following commands, global aphasia.  PERRL, left gaze preference, slightly cross midline, right neglect, but able to track object from left to right, not blinking to visual threat on the right.  Right facial droop, tongue protrusion not corporative.  Left upper and lower extremity spontaneous movement against gravity, purposeful.  Right upper extremity 0/5, flaccid.  Right lower extremity mild withdrawal to pain stimulation.  Decreased muscle tone on the right.  Babinski positive on the right. Sensation, coordination and gait not tested.   ASSESSMENT/PLAN [name] is a 53 y.o. female with history of migraines and anxiety presenting with right sided weakness and AMS. She did not receive IV t-PA due to [place].  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 5 mm rightward midline shift.   MRI head - Large intra-axial hemorrhage in the left hemisphere centered  at the left lentiform nucleus with estimated 53 mL volume appears not significantly changed in size since 07/09/2019. Mildly increased rightward midline shift of 6-7 mm.  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  CT head repeat 12/14 unchanged L cerebral hemorrhage w/ vasogenic edema and 83mm midline shift.  2D Echo - EF 60 - 65%. No cardiac source of emboli identified.   Sars Corona Virus 2 - negative  LDL - 156  HgbA1c - 5.3  UDS - neg  VTE prophylaxis - SCDs  No antithrombotic prior to admission, now on No antithrombotic  Therapy recommendations:  CIR  Disposition:  Pending   Medically ready for rehab when bed available  Cerebral edema  CT head showed midline shift 5 mm  MRI repeat showed midline shift 6 to 7 mm  CT repeat stable hematoma and MLS at 73mm  Central Line placed 07/11/19  On 3% saline -> off now   Na 137->138->138->157->155->162->161->160->156->152->148  Sodium daily  Dysphagia  Due to [place] and aphasia  Speech on board  Cortrak by flouro placed 12/14  Cleared for D2 thin liquid diet  TF and cortrak discontinued 12/16  Hypertension  Home BP meds: none   Off cleviprex  On lisinopril 20 bid, norvasc 10 . SBP goal < 160 mm Hg . Stable . Long-term BP goal normotensive  Hyperlipidemia  Home Lipid lowering medication: fish oil, red rice yeast  LDL 156, goal < 70  Current lipid lowering medication: None for now given hemorrhage   Consider statin on discharge  Other Stroke Risk Factors  Former cigarette smoker - quit  ETOH use, advised to drink no more than 1 alcoholic beverage per day.  Migraines  Other Active Problems  Possible small meningioma. Small volume subdural hematoma less likely. Follow-up post-contrast brain MRI would be necessary to confirm.  Hypokalemia - 2.9 ->3.4->3.5->3.4 - supplement - 4.0  Mild Leukocytosis, resolved WBC 6.7-7.8 - monitor  Hospital day # 7     Rosalin Hawking, MD PhD Stroke Neurology 07/16/2019 8:36 AM   To contact Stroke Continuity provider, please refer to [email or web address] After hours, contact General Neurology

## 2019-07-17 LAB — GLUCOSE, CAPILLARY
Glucose-Capillary: 122 mg/dL — ABNORMAL HIGH (ref 70–99)
Glucose-Capillary: 177 mg/dL — ABNORMAL HIGH (ref 70–99)
Glucose-Capillary: 176 mg/dL — ABNORMAL HIGH (ref 70–99)
Glucose-Capillary: 171 mg/dL — ABNORMAL HIGH (ref 70–99)

## 2019-07-17 MED ORDER — ATORVASTATIN CALCIUM 10 MG PO TABS: 20.0000 mg | ORAL_TABLET | Freq: Every day | ORAL | Status: DC

## 2019-07-17 NOTE — H&P (Signed)
Physical Medicine and Rehabilitation Admission H&P    Chief Complaint  Patient presents with  . Code Stroke  : HPI: Melanie Madden is a 53 year old right-handed female with history of hypertension, anxiety, migraine headaches and tobacco abuse.  Per chart review and husband, due to cognition, patient lives with spouse and was independent prior to admission.  She presented on 07/09/2019 with right-sided weakness and aphasia after returning home from work.  Cranial CT scan showed intraparenchymal hematoma with a volume of 44 cc centered in the left basal ganglia most consistent with hypertensive hemorrhage.  5 mm rightward midline shift.  MRA of the head negative.  Echocardiogram with ejection fraction of 65%.  Patient did not receive TPA due to [place].  Admission labs SARS coronavirus negative.  Patient initially on 3% saline.  Cleviprex for blood pressure control later discontinued.  Her diet was advanced to dysphagia #2 nectar thick liquid.  She was cleared to begin subcutaneous heparin for DVT prophylaxis 07/14/2019.  Therapy evaluations completed and patient was admitted for a comprehensive rehab program.  Please see preadmission assessment from earlier today as well.  Review of Systems  Unable to perform ROS: Mental acuity   Past Medical History:  Diagnosis Date  . Allergy   . Anxiety   . Arthritis   . High triglycerides   . Kidney stones   . Migraines   . Recurrent sinus infections    Past Surgical History:  Procedure Laterality Date  . ANTERIOR CRUCIATE LIGAMENT REPAIR Left   . BUNIONECTOMY Right   . LASIK    . LITHOTRIPSY    . MENISCUS REPAIR Left   . NASAL SEPTUM SURGERY    . SHOULDER SURGERY    . VARICOSE VEIN SURGERY Left    Family History  Problem Relation Age of Onset  . Alzheimer's disease Mother   . Hypertension Mother   . Diabetes Mother   . Thyroid disease Mother   . Deep vein thrombosis Maternal Grandmother   . Colon cancer Neg Hx   . Colon polyps Neg Hx   .  Esophageal cancer Neg Hx   . Pancreatic cancer Neg Hx   . Rectal cancer Neg Hx   . Stomach cancer Neg Hx   . Breast cancer Neg Hx    Social History:  reports that she has quit smoking. She has never used smokeless tobacco. She reports current alcohol use. She reports that she does not use drugs. Allergies:  Allergies  Allergen Reactions  . Amoxicillin Nausea Only  . Shellfish Allergy Diarrhea and Nausea And Vomiting    [place]    Facility-Administered Medications Prior to Admission  Medication Dose Route Frequency Provider Last Rate Last Admin  . 0.9 %  sodium chloride infusion  500 mL Intravenous Continuous Nandigam, Venia Minks, MD       Medications Prior to Admission  Medication Sig Dispense Refill  . acetaminophen (TYLENOL) 500 MG tablet Take 1 tablet (500 mg total) by mouth every 6 (six) hours as needed. (Patient taking differently: Take 500 mg by mouth every 6 (six) hours as needed for mild pain. ) 30 tablet 0  . B Complex-C (B-COMPLEX WITH VITAMIN C) tablet Take 1 tablet by mouth daily.    Marland Kitchen BIOTIN PO Take by mouth.    . cetirizine (ZYRTEC) 10 MG tablet Take 10 mg by mouth daily.    . cholecalciferol (VITAMIN D) 1000 units tablet Take 1,000 Units by mouth daily. 2,000 units daily    .  Cyanocobalamin (VITAMIN B-12 PO) Take by mouth.    . docusate sodium (COLACE) 100 MG capsule Take 100 mg by mouth daily.    . Flaxseed, Linseed, (FLAX SEEDS PO) Take 1 tablet by mouth daily.    . fluticasone (FLONASE) 50 MCG/ACT nasal spray Place 1 spray into both nostrils 2 (two) times daily.    . Ginkgo Biloba (GINKGO PO) Take by mouth.    . hydrOXYzine (ATARAX/VISTARIL) 25 MG tablet Take 1 tablet (25 mg total) by mouth 3 (three) times daily as needed. (Patient not taking: Reported on 01/29/2018) 90 tablet 1  . ibuprofen (ADVIL,MOTRIN) 600 MG tablet Take 1 tablet (600 mg total) by mouth every 6 (six) hours as needed. 30 tablet 0  . ipratropium (ATROVENT) 0.03 % nasal spray Place into the nose.    .  magnesium oxide (MAG-OX) 400 (241.3 Mg) MG tablet Take 400 mg by mouth daily.    . medroxyPROGESTERone (DEPO-PROVERA) 150 MG/ML injection Inject 150 mg into the muscle every 3 (three) months.     . methocarbamol (ROBAXIN) 500 MG tablet Take 1 tablet (500 mg total) by mouth every 8 (eight) hours as needed for muscle spasms. 20 tablet 0  . naproxen (NAPROSYN) 500 MG tablet Take 1 tablet (500 mg total) by mouth every 12 (twelve) hours as needed for mild pain or moderate pain. 30 tablet 0  . NON FORMULARY caltrate bone plus minerals 600plus d3    . Omega-3 Fatty Acids (FISH OIL) 1200 MG CPDR Take by mouth.    . Red Yeast Rice Extract (RED YEAST RICE PO) Take by mouth.    Marland Kitchen UNABLE TO FIND verisol collagen powder    . UNABLE TO FIND cbd oil    . VITAMIN E PO Take by mouth.    . zinc sulfate (ZINC-220) 220 (50 Zn) MG capsule Take 220 mg by mouth daily.      Drug Regimen Review Drug regimen was reviewed and remains appropriate with no significant issues identified  Home: Home Living Family/patient expects to be discharged to:: Private residence Living Arrangements: Spouse/significant other Available Help at Discharge: Family, Available 24 hours/day(spouse and hired assist) Type of Home: House Home Access: Stairs to enter Technical brewer of Steps: 2 to 3 Entrance Stairs-Rails: None Home Layout: Able to live on main level with bedroom/bathroom, Full bath on main level Bathroom Shower/Tub: Tub/shower unit, Estate agent Accessibility: Yes Additional Comments: verified with spouse  Lives With: Spouse   Functional History: Prior Function Level of Independence: Independent Comments: works  Functional Status:  Mobility: Bed Mobility Overal bed mobility: Needs Assistance Bed Mobility: Rolling Rolling: Max assist Supine to sit: Mod assist, Max assist General bed mobility comments: Mod-max +1 to move to edge of bed.  Max VCs for  sequencing Transfers Overall transfer level: Needs assistance Equipment used: Ambulation equipment used(sara + sit to stand) Transfer via Lift Equipment: Marketing executive Transfers: Sit to/from Stand Sit to Stand: Max assist, +2 physical assistance General transfer comment: Utilized sara + lift to achieve standing.  Pt required cues for hand placement and hand over hand assistance to keep L elbow on platform.  Pt is recepetive to cues for upright trunk control.  Pt required cues for upper trunk control and to keep arms braced on platform. Ambulation/Gait Ambulation/Gait assistance: (NT unable)    ADL: ADL Overall ADL's : Needs assistance/impaired Eating/Feeding: NPO Grooming Details (indicate cue type and reason): Washed both sides of face/mouth but not forehead when washcloth touched  to her face and then placed in her left hand; when comb place in left hand she did not attempt to comb hair even after combing it for her a little and placing comb back in her hand Upper Body Bathing: Total assistance, Sitting Lower Body Bathing: Total assistance, Bed level Upper Body Dressing : Maximal assistance, Sitting Lower Body Dressing: Total assistance, Bed level  Cognition: Cognition Overall Cognitive Status: Impaired/Different from baseline Arousal/Alertness: Awake/alert Orientation Level: Oriented to person Attention: Sustained Sustained Attention: Impaired Sustained Attention Impairment: Verbal basic, Functional basic Awareness: Impaired Behaviors: Restless Cognition Arousal/Alertness: Awake/alert Behavior During Therapy: Restless, Impulsive Overall Cognitive Status: Impaired/Different from baseline Area of Impairment: Awareness, Safety/judgement, Following commands Following Commands: Follows one step commands inconsistently Safety/Judgement: Decreased awareness of safety, Decreased awareness of deficits Awareness: Intellectual General Comments: PTA started both socks and patient able to  complete donning with flexing forward from waist.  She does push and throw weight to the R.  While PTA was setting up room she pulled on rail to sit up with left hadn then threw weight to the R and hit her head on the bed rail. Difficult to assess due to: Impaired communication  Physical Exam: Blood pressure 140/77, pulse (!) 110, temperature 98.6 F (37 C), temperature source Oral, resp. rate 17, height 5\' 5"  (1.651 m), weight 61.7 kg, SpO2 100 %. Physical Exam  Vitals reviewed. Constitutional: She appears well-developed and well-nourished.  HENT:  Head: Normocephalic and atraumatic.  Eyes: EOM are normal. Right eye exhibits no discharge. Left eye exhibits no discharge.  Neck: No tracheal deviation present. No thyromegaly present.  Respiratory: No stridor. No respiratory distress.  GI: She exhibits no distension.  Musculoskeletal:     Comments: No edema or tenderness in extremities  Neurological: She is alert.  Nonverbal Global aphasia Exam limited due to lack of ability to follow commands, however seen spontaneously moving LUE and LLE appears to sense pain on right side No movement noted on RUE/RLE  Skin: Skin is warm and dry.  Psychiatric:  Unable to assess due to mentation    Results for orders placed or performed during the hospital encounter of 07/09/19 (from the past 48 hour(s))  Glucose, capillary     Status: Abnormal   Collection Time: 07/16/19  4:56 PM  Result Value Ref Range   Glucose-Capillary 160 (H) 70 - 99 mg/dL  Glucose, capillary     Status: Abnormal   Collection Time: 07/16/19  9:28 PM  Result Value Ref Range   Glucose-Capillary 129 (H) 70 - 99 mg/dL  Glucose, capillary     Status: Abnormal   Collection Time: 07/17/19  6:23 AM  Result Value Ref Range   Glucose-Capillary 122 (H) 70 - 99 mg/dL  Glucose, capillary     Status: Abnormal   Collection Time: 07/17/19  1:06 PM  Result Value Ref Range   Glucose-Capillary 177 (H) 70 - 99 mg/dL  Glucose, capillary      Status: Abnormal   Collection Time: 07/17/19  4:34 PM  Result Value Ref Range   Glucose-Capillary 176 (H) 70 - 99 mg/dL  Glucose, capillary     Status: Abnormal   Collection Time: 07/17/19  9:46 PM  Result Value Ref Range   Glucose-Capillary 171 (H) 70 - 99 mg/dL  CBC     Status: None   Collection Time: 07/18/19  3:16 AM  Result Value Ref Range   WBC 8.3 4.0 - 10.5 K/uL   RBC 4.62 3.87 - 5.11 MIL/uL  Hemoglobin 14.0 12.0 - 15.0 g/dL   HCT 43.5 36.0 - 46.0 %   MCV 94.2 80.0 - 100.0 fL   MCH 30.3 26.0 - 34.0 pg   MCHC 32.2 30.0 - 36.0 g/dL   RDW 13.1 11.5 - 15.5 %   Platelets 175 150 - 400 K/uL   nRBC 0.0 0.0 - 0.2 %    Comment: Performed at [facility], [place] [address], [place], [place] [id number]  Basic metabolic panel     Status: Abnormal   Collection Time: 07/18/19  3:16 AM  Result Value Ref Range   Sodium 151 (H) 135 - 145 mmol/L   Potassium 4.0 3.5 - 5.1 mmol/L   Chloride 113 (H) 98 - 111 mmol/L   CO2 29 22 - 32 mmol/L   Glucose, Bld 159 (H) 70 - 99 mg/dL   BUN 26 (H) 6 - 20 mg/dL   Creatinine, Ser 0.70 0.44 - 1.00 mg/dL   Calcium 9.8 8.9 - 10.3 mg/dL   GFR calc non Af Amer >60 >60 mL/min   GFR calc Af Amer >60 >60 mL/min   Anion gap 9 5 - 15    Comment: Performed at [facility], [place] [address], [place], [place] [address]  Glucose, capillary     Status: Abnormal   Collection Time: 07/18/19  6:12 AM  Result Value Ref Range   Glucose-Capillary 133 (H) 70 - 99 mg/dL  Glucose, capillary     Status: Abnormal   Collection Time: 07/18/19 11:30 AM  Result Value Ref Range   Glucose-Capillary 179 (H) 70 - 99 mg/dL   Comment 1 Notify RN    Comment 2 Document in Chart    No results found.     Medical Problem List and Plan: 1.  Right side weakness and aphasia secondary to left basal ganglia hypertensive hemorrhage  -patient may shower  -ELOS/Goals: 17-20 days/min a  Admit to CIR 2.  Antithrombotics: -DVT/anticoagulation: Subcutaneous heparin initiated  07/14/2019.  Monitor for any bleeding episodes  -antiplatelet therapy: N/A 3. Pain Management: Tylenol as needed 4. Mood: Provide emotional support  -antipsychotic agents: N/A 5. Neuropsych: This patient is not capable of making decisions on her own behalf. 6. Skin/Wound Care: Routine skin checks 7. Fluids/Electrolytes/Nutrition: Routine in and outs.  CMP ordered 8.  Hypertension.  Lisinopril 20 mg twice daily, Norvasc 10 mg daily.    Monitor with increased mobility 9.  Hypernatremia-likely due to hypertonic saline  Follow-up CMP 10.  Leukocytosis  WBCs 11.5 on 12/19, CBC ordered  Afebrile  Cathlyn Parsons, PA-C 07/18/2019  I have personally performed a face to face diagnostic evaluation, including, but not limited to relevant history and physical exam findings, of this patient and developed relevant assessment and plan.  Additionally, I have reviewed and concur with the physician assistant's documentation above.  Delice Lesch, MD, ABPMR

## 2019-07-17 NOTE — Progress Notes (Signed)
  Speech Language Pathology Treatment: Dysphagia  Patient Details Name: Melanie Madden MRN: [id number] DOB: Mar 28, 1966 Today's Date: 07/17/2019 Time: [id number] SLP Time Calculation (min) (ACUTE ONLY): 22 min  Assessment / Plan / Recommendation Clinical Impression  Patient seen for skilled dysphagia treatment during her breakfast meal. Pt lying in bed upon arrival with left glove and restraint on left arm.  CNA present to help position pt upright in bed. CNA reported pt's native language is [place], but also speaks [place], [place] and [place]. CNA asked a few simple questions to patient about how she feels and is she hungry and pt made verbal attempts to answer her. It appears her comprehension may be greater in her native language of [place]. Assisted pt with solids, but she was able to hold the liquids and drink through straw. She had no coughing with liquids, however anterior spillage was noted on several sips. She continues to require small bites 1/2-3/4 tsp size safely control bolus size. Larger bites resulted in an immediate cough. Recommend continuing Dys 2, nectar thickened diet and full supervision, hand over hand at times to cue pt to take smaller sips. NG tube has been removed and pt's appetite is very good. Continue ST services to address ongoing dysphagia and language/cogn goals.    HPI HPI: 53 y.o. female who presents with right-sided weakness that started abruptly after returning home from work.  Her husband states that she got home around 6 PM, and was relatively normal at that time.  She subsequently stated that she was not feeling well and sat down.  Her mental status then progressively declined and he called 911.  En route, code stroke was activated due to right-sided hemiparesis and after arrival she was taken for CT scan which reveals a large basal ganglia hemorrhage on the left.      SLP Plan          Recommendations  Diet recommendations: Dysphagia 2 (fine  chop);Nectar-thick liquid Liquids provided via: Cup;Straw Medication Administration: Crushed with puree Supervision: Staff to assist with self feeding;Full supervision/cueing for compensatory strategies Compensations: Slow rate;Small sips/bites;Minimize environmental distractions Postural Changes and/or Swallow Maneuvers: Seated upright 90 degrees;Upright 30-60 min after meal                General recommendations: Rehab consult Oral Care Recommendations: Oral care QID;Staff/trained caregiver to provide oral care Follow up Recommendations: Inpatient Rehab SLP Visit Diagnosis: Dysphagia, oropharyngeal phase (R13.12);Aphasia (R47.01) Attention and concentration deficit following: Nontraumatic intracerebral hemorrhage       GO                Wynelle Bourgeois., MA, CCC-SLP 07/17/2019, 9:46 AM

## 2019-07-17 NOTE — Progress Notes (Signed)
Inpatient Rehabilitation Admissions Coordinator  I have insurance approval to admit pt to inpt rehab on Saturday. I met with pt's spouse at bedside and he is aware. Dr. Erlinda Hong and SW, Kathlee Nations are aware. I will make the arrangements to admit Saturday.  3 [place] Therapist, sports can Naval architect on Rehab at 12 noon to give report at [phone]. Dr. Delice Lesch will make rounds in the morning to give final approval to admit.   Danne Baxter, RN, MSN Rehab Admissions Coordinator [phone] 07/17/2019 4:47 PM

## 2019-07-17 NOTE — Progress Notes (Signed)
STROKE TEAM PROGRESS NOTE   INTERVAL HISTORY Husband and PT in room trying to get pt to chair. Pt neuro stable, no significant change. No acute event overnight. CIR got insurance approval for CIR placement tomorrow.   OBJECTIVE Vitals:   07/16/19 1957 07/16/19 2351 07/17/19 0321 07/17/19 0841  BP: 126/77 122/86 135/84 (!) 136/95  Pulse: 98 89 79 79  Resp:    16  Temp:  98.1 F (36.7 C) (!) 97.5 F (36.4 C) (!) 97.4 F (36.3 C)  TempSrc:  Oral Oral   SpO2: 99% 99% 100% 100%  Weight:        CBC:  Recent Labs  Lab 07/15/19 0600 07/16/19 0500  WBC 6.7 7.8  HGB 14.5 14.3  HCT 45.5 44.9  MCV 93.6 93.9  PLT 147* 141*    Basic Metabolic Panel:  Recent Labs  Lab 07/14/19 0420 07/14/19 0420 07/14/19 1655 07/15/19 0600 07/16/19 0500  NA 160*   < >  --  152* 148*  K 3.5  --   --  3.4* 4.0  CL 125*  --   --  118* 114*  CO2 25  --   --  27 27  GLUCOSE 153*  --   --  239* 126*  BUN 31*  --   --  23* 20  CREATININE 0.65  --   --  0.53 0.55  CALCIUM 9.9  --   --  9.5 9.6  MG 2.4  --  2.2  --   --   PHOS 3.8  --  2.9  --   --    < > = values in this interval not displayed.    Lipid Panel:     Component Value Date/Time   CHOL 265 (H) 07/10/2019 1145   TRIG 166 (H) 07/14/2019 0420   HDL 81 07/10/2019 1145   CHOLHDL 3.3 07/10/2019 1145   VLDL 28 07/10/2019 1145   LDLCALC 156 (H) 07/10/2019 1145   HgbA1c:  Lab Results  Component Value Date   HGBA1C 5.3 07/10/2019   Urine Drug Screen:     Component Value Date/Time   LABOPIA NONE DETECTED 07/10/2019 1126   COCAINSCRNUR NONE DETECTED 07/10/2019 1126   LABBENZ NONE DETECTED 07/10/2019 1126   AMPHETMU NONE DETECTED 07/10/2019 1126   THCU NONE DETECTED 07/10/2019 1126   LABBARB NONE DETECTED 07/10/2019 1126    Alcohol Level No results found for: [facility]  IMAGING  CT HEAD CODE STROKE WO CONTRAST 07/09/2019 IMPRESSION:  1. Intraparenchymal hematoma with volume of 44 cc centered in the left basal ganglia, most  consistent with hypertensive hemorrhage.  2. 5 mm rightward midline shift.   MRI MRA Head 07/11/2019 IMPRESSION: 1. Large intra-axial hemorrhage in the left hemisphere centered at the left lentiform nucleus with estimated 53 mL volume appears not significantly changed in size since 07/09/2019. Superimposed restricted diffusion suggesting this is hemorrhagic transformation of an ischemic infarct. Surrounding edema and regional mass effect including mildly increased rightward midline shift of 6-7 mm. No ventricular trapping.  Basilar cisterns remain patent. 2. Trace intraventricular hemorrhage. No definite extra-axial extension of blood (although see #3). 3. Small indeterminate extra-axial collection or mass in the left prepontine cistern measuring 15 x 7 x 11 mm. Burtis Junes this is a small meningioma. Small volume subdural hematoma less likely. Follow-up post-contrast brain MRI would be necessary to confirm. 4. 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  CT HEAD WO CONTRAST 07/13/2019 1. Compared to brain MRI 2 days ago  unchanged left cerebral hemorrhage with vasogenic edema and 6 mm of midline shift. 2. 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    Transthoracic Echocardiogram  07/10/2019 IMPRESSIONS  1. Left ventricular ejection fraction, by visual estimation, is 60 to 65%. The left ventricle has normal function. There is no left ventricular hypertrophy.  2. The left ventricle has no regional wall motion abnormalities.  3. Global right ventricle has normal systolic function.The right ventricular size is small. No increase in right ventricular wall thickness.  4. Left atrial size was normal.  5. Right atrial size was normal.  6. The mitral valve is normal in structure. No evidence of mitral valve regurgitation.  7. The tricuspid valve is normal in structure. Tricuspid valve regurgitation is trivial.  8. The aortic valve is normal in structure. Aortic  valve regurgitation is not visualized.  9. The pulmonic valve was normal in structure. Pulmonic valve regurgitation is not visualized. 10. The atrial septum is grossly normal.  ECG - SR rate 70 BPM. (See cardiology reading for complete details)   PHYSICAL EXAM   Blood pressure (!) 136/95, pulse 79, temperature (!) 97.4 F (36.3 C), resp. rate 16, weight 61.4 kg, SpO2 100 %.  General - Well nourished, well developed, not in acute distress.  Ophthalmologic - fundi not visualized due to noncooperation.  Cardiovascular - Regular rhythm and rate  Neuro - awake alert, nonverbal, did not make any sounds today, not following commands, global aphasia.  PERRL, left gaze preference, slightly cross midline, right neglect, but able to track object from left to right, not blinking to visual threat on the right.  Right facial droop, tongue protrusion not corporative.  Left upper and lower extremity spontaneous movement against gravity, purposeful.  Right upper extremity 0/5, flaccid.  Right lower extremity mild withdrawal to pain stimulation.  Decreased muscle tone on the right.  Babinski positive on the right. Sensation, coordination and gait not tested.   ASSESSMENT/PLAN Melanie Madden is a 53 y.o. female with history of migraines and anxiety presenting with right sided weakness and AMS. She did not receive IV t-PA due to [place].  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 5 mm rightward midline shift.   MRI head - Large intra-axial hemorrhage in the left hemisphere centered at the left lentiform nucleus with estimated 53 mL volume appears not significantly changed in size since 07/09/2019. Mildly increased rightward midline shift of 6-7 mm.  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  CT head repeat 12/14 unchanged L cerebral hemorrhage w/ vasogenic edema and 25mm midline shift.  2D Echo - EF 60 - 65%. No cardiac source of emboli identified.   Sars Corona Virus 2 - negative  LDL - 156  HgbA1c - 5.3  UDS - neg  VTE prophylaxis - SCDs  No antithrombotic prior to admission, now on No antithrombotic  Therapy recommendations:  CIR  Disposition:  Pending   Medically ready for rehab - anticipate d/c to CIR tomorrow  Cerebral edema  CT head showed midline shift 5 mm  MRI repeat showed midline shift 6 to 7 mm  CT repeat stable hematoma and MLS at 82mm  Central Line placed 07/11/19  On 3% saline -> off now   Na 137->138->138->157->155->162->161->160->156->152->148  Sodium daily  Dysphagia  Due to [place] and aphasia  Speech on board  Cortrak by flouro placed 12/14, TF and cortrak discontinued 12/16  On D2 thin liquid diet  Hypertension  Home BP meds: none   Off cleviprex  On lisinopril 20 bid, norvasc 10 . SBP goal < 160 mm Hg . Stable . Long-term BP goal normotensive  Hyperlipidemia  Home Lipid lowering medication: fish oil, red rice yeast  LDL 156, goal < 70  Current lipid lowering medication: on lipitor 20   Continue statin on discharge  Other Stroke Risk Factors  Former cigarette smoker - quit  ETOH use, advised to drink no more than 1 alcoholic beverage per day.  Migraines  Other Active Problems  Possible small meningioma. Small volume subdural hematoma less likely. Follow-up post-contrast brain MRI would be necessary to confirm.  Hypokalemia - 2.9 ->3.4->3.5->3.4 - supplement - 4.0  Mild Leukocytosis, resolved WBC 6.7-7.8 - monitor  Hospital day # 8    Melanie Hawking, MD PhD Stroke Neurology 07/17/2019 12:28 PM   To contact Stroke Continuity provider, please refer to [email or web address] After hours, contact General Neurology

## 2019-07-17 NOTE — Progress Notes (Signed)
Soft restraints placed on the L wrist due to pt trying to pull off the PICC line, pulling tele off and not following commands, MD aware

## 2019-07-17 NOTE — Progress Notes (Signed)
Physical Therapy Treatment Patient Details Name: Melanie Madden MRN: [id number] DOB: February 16, 1966 Today's Date: 07/17/2019    History of Present Illness Pt adm with rt sided weakness and AMS. 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 PMH - migraines and anxiety    PT Comments    Pt performed functional mobility with sara + sit to stand lift to improve safety.  Pt required cues for upper trunk control and B UE placement on platforms.  Pt able to stand for increased length of time.  She continues to require max +2 assistance to mobilize.  She remains an excellent candidate for aggressive therapies at CIR.  Will continue to perform intervention until d/c.        Follow Up Recommendations  CIR;Supervision/Assistance - 24 hour     Equipment Recommendations  Other (comment)(TBD)    Recommendations for Other Services       Precautions / Restrictions Precautions Precautions: Fall;Other (comment) Precaution Comments: pusher, right hemiparesis Restrictions Weight Bearing Restrictions: No    Mobility  Bed Mobility Overal bed mobility: Needs Assistance Bed Mobility: Rolling Rolling: Max assist   Supine to sit: Mod assist;Max assist     General bed mobility comments: Mod-max +1 to move to edge of bed.  Max VCs for sequencing  Transfers Overall transfer level: Needs assistance Equipment used: Ambulation equipment used(sara + sit to stand) Transfers: Sit to/from Stand Sit to Stand: Max assist;+2 physical assistance         General transfer comment: Utilized sara + lift to achieve standing.  Pt required cues for hand placement and hand over hand assistance to keep L elbow on platform.  Pt is recepetive to cues for upright trunk control.  Pt required cues for upper trunk control and to keep arms braced on platform.  Ambulation/Gait Ambulation/Gait assistance: (NT unable)               Stairs             Wheelchair Mobility    Modified Rankin (Stroke Patients  Only) Modified Rankin (Stroke Patients Only) Pre-Morbid Rankin Score: No symptoms Modified Rankin: Severe disability     Balance Overall balance assessment: Needs assistance Sitting-balance support: Single extremity supported Sitting balance-Leahy Scale: Zero       Standing balance-Leahy Scale: Zero                              Cognition Arousal/Alertness: Awake/alert Behavior During Therapy: Restless;Impulsive Overall Cognitive Status: Impaired/Different from baseline Area of Impairment: Awareness;Safety/judgement;Following commands                       Following Commands: Follows one step commands inconsistently Safety/Judgement: Decreased awareness of safety;Decreased awareness of deficits Awareness: Intellectual   General Comments: PTA started both socks and patient able to complete donning with flexing forward from waist.  She does push and throw weight to the R.  While PTA was setting up room she pulled on rail to sit up with left hadn then threw weight to the R and hit her head on the bed rail.      Exercises      General Comments        Pertinent Vitals/Pain Pain Assessment: Faces Faces Pain Scale: No hurt    Home Living                      Prior Function  PT Goals (current goals can now be found in the care plan section) Acute Rehab PT Goals Patient Stated Goal: unable to state Potential to Achieve Goals: Fair Progress towards PT goals: Progressing toward goals    Frequency    Min 4X/week      PT Plan Current plan remains appropriate    Co-evaluation              AM-PAC PT "6 Clicks" Mobility   Outcome Measure  Help needed turning from your back to your side while in a flat bed without using bedrails?: Total Help needed moving from lying on your back to sitting on the side of a flat bed without using bedrails?: Total Help needed moving to and from a bed to a chair (including a wheelchair)?:  Total Help needed standing up from a chair using your arms (e.g., wheelchair or bedside chair)?: Total Help needed to walk in hospital room?: Total Help needed climbing 3-5 steps with a railing? : Total 6 Click Score: 6    End of Session Equipment Utilized During Treatment: Gait belt Activity Tolerance: Treatment limited secondary to agitation Patient left: with call bell/phone within reach;with family/visitor present;in chair;with chair alarm set(left restraints off with husband present) Nurse Communication: Mobility status PT Visit Diagnosis: Other abnormalities of gait and mobility (R26.89);Hemiplegia and hemiparesis Hemiplegia - Right/Left: Right Hemiplegia - dominant/non-dominant: Dominant Hemiplegia - caused by: Nontraumatic intracerebral hemorrhage     Time: [id number] PT Time Calculation (min) (ACUTE ONLY): 32 min  Charges:  $Therapeutic Activity: 23-37 mins                     Melanie Madden , PTA Acute Rehabilitation Services Pager [phone] Office [phone]     Melanie Madden Melanie Madden 07/17/2019, 4:27 PM

## 2019-07-17 NOTE — PMR Pre-admission (Signed)
PMR Admission Coordinator Pre-Admission Assessment  Patient: Melanie Madden is an 53 y.o., female MRN: [id number] DOB: [birth year] Height: 5\' 5"  (165.1 cm) Weight: 61.4 kg              Insurance Information HMO:     PPO: yes     PCP: \     IPA: \     80/20:      OTHER:  PRIMARY: [employer]      Policy#: [id number]      Subscriber: spouse CM Name: Charlaine Dalton      Phone#: [phone] ext [phone]     Fax#: Epic access Pre-Cert#: [id number] approved for 7 days     Employer: [employer] Benefits:  Phone #: [phone]     Name: 12/16 Eff. Date: 07/30/2018     Deduct: $500      Out of Pocket Max: $4000      Life Max: none CIR: 100% coverage after deductible      SNF: 100% coverage 60 days per benefit period Outpatient: $50 per visit     Co-Pay: 60 visits combined Home Health: 100%      Co-Pay: visits per medical neccesity DME: 100%     Co-Pay: none Providers: in network  SECONDARY: none        Medicaid Application Date:       Case Manager:  Disability Application Date:       Case Worker:   The "Data Collection Information Summary" for patients in Inpatient Rehabilitation Facilities with attached "Privacy Act [place] Records" was provided and verbally reviewed with: N/A  Emergency Contact Information Contact Information    Name Relation Home Work Mobile   Rock,[name] Spouse [phone]  [phone]     Current Medical History  Patient Admitting Diagnosis: ICH  History of Present Illness:  53 year old right-handed female with history of hypertension, anxiety, migraine headaches and tobacco abuse.  Presented 07/09/2019 with right-sided weakness and aphasia of acute onset after returning home from work.  Cranial CT scan showed intraparenchymal hematoma with a volume of 44 cc centered in the left basal ganglia most consistent with hypertensive hemorrhage.  5 mm rightward midline shift.  MRA of the head negative.  Echocardiogram with ejection fraction of 65%.  Patient did not receive  TPA due to [place].  Admission labs SARS coronavirus negative.  Patient initially on 3% saline.  Cleviprex for blood pressure control later discontinued.  Her diet was advanced to dysphagia #2 nectar thick liquid.  She was cleared to begin subcutaneous heparin for DVT prophylaxis 07/14/2019.    Complete NIHSS TOTAL: 17 Glasgow Coma Scale Score: 10  Past Medical History  Past Medical History:  Diagnosis Date  . Allergy   . Anxiety   . Arthritis   . High triglycerides   . Kidney stones   . Migraines   . Recurrent sinus infections     Family History  family history includes Alzheimer's disease in her mother; Deep vein thrombosis in her maternal grandmother; Diabetes in her mother; Hypertension in her mother; Thyroid disease in her mother.  Prior Rehab/Hospitalizations:  Has the patient had prior rehab or hospitalizations prior to admission? Yes  Has the patient had major surgery during 100 days prior to admission? No  Current Medications   Current Facility-Administered Medications:  .  acetaminophen (TYLENOL) tablet 650 mg, 650 mg, Oral, Q4H PRN **OR** acetaminophen (TYLENOL) 160 MG/5ML solution 650 mg, 650 mg, Per Tube, Q4H PRN **OR** acetaminophen (TYLENOL) suppository 650 mg, 650 mg, Rectal,  Q4H PRN, Greta Doom, MD .  amLODipine (NORVASC) tablet 10 mg, 10 mg, Oral, Daily, Rosalin Hawking, MD, 10 mg at 07/17/19 1007 .  Chlorhexidine Gluconate Cloth 2 % PADS 6 each, 6 each, Topical, Daily, Garvin Fila, MD, 6 each at 07/17/19 1020 .  feeding supplement (ENSURE ENLIVE) (ENSURE ENLIVE) liquid 237 mL, 237 mL, Oral, BID BM, Rosalin Hawking, MD, 237 mL at 07/17/19 1336 .  heparin injection 5,000 Units, 5,000 Units, Subcutaneous, Q8H, Rosalin Hawking, MD, 5,000 Units at 07/17/19 1338 .  insulin aspart (novoLOG) injection 0-6 Units, 0-6 Units, Subcutaneous, TID WC, Rosalin Hawking, MD, 1 Units at 07/17/19 1335 .  labetalol (NORMODYNE) injection 10-20 mg, 10-20 mg, Intravenous, Q2H PRN, Rosalin Hawking, MD .  lisinopril (ZESTRIL) tablet 20 mg, 20 mg, Oral, BID, Rosalin Hawking, MD, 20 mg at 07/17/19 1007 .  Resource ThickenUp Clear, , Oral, PRN, Rosalin Hawking, MD .  senna-docusate (Senokot-S) tablet 1 tablet, 1 tablet, Oral, BID, Rosalin Hawking, MD, 1 tablet at 07/17/19 1007 .  sodium chloride flush (NS) 0.9 % injection 10-40 mL, 10-40 mL, Intracatheter, Q12H, Rosalin Hawking, MD, 10 mL at 07/17/19 1008 .  sodium chloride flush (NS) 0.9 % injection 10-40 mL, 10-40 mL, Intracatheter, PRN, Rosalin Hawking, MD  Patients Current Diet:  Diet Order            DIET DYS 2 Room service appropriate? 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  Independent  Indoor Mobility: Did the patient need assistance with walking from room to room (with or without device)? Independent  Stairs: Did the patient need assistance with internal or external stairs (with or without device)? Independent  Functional Cognition: Did the patient need help planning regular tasks such as shopping or remembering to take medications? Independent  Home Assistive Devices / Equipment    Prior Device Use: Indicate devices/aids used by the patient prior to current illness, exacerbation or injury? None of the above  Current Functional Level Cognition  Arousal/Alertness: Awake/alert Overall Cognitive Status: Impaired/Different from baseline Difficult to assess due to:  Impaired communication Orientation Level: Other (comment)(UTA) Following Commands: Follows one step commands inconsistently Safety/Judgement: Decreased awareness of safety, Decreased awareness of deficits General Comments: PTA started both socks and patient able to complete donning with flexing forward from waist.  She does push and throw weight to the R.  While PTA was setting up room she pulled on rail to sit up with left hadn then threw weight to the R and hit her head on the bed rail. Attention: Sustained Sustained Attention: Impaired Sustained Attention Impairment: Verbal basic, Functional basic Awareness: Impaired Behaviors: Restless    Extremity Assessment (includes Sensation/Coordination)  Upper Extremity Assessment: RUE deficits/detail RUE Deficits / Details: Flaccid; she did intermittently regard it  Lower Extremity Assessment: RLE deficits/detail RLE Deficits / Details: Strength 0/5. PROM WFL. Pt trying to use LLE to lift RLE    ADLs  Overall ADL's : Needs assistance/impaired Eating/Feeding: NPO Grooming Details (indicate cue type and reason): Washed both sides of face/mouth but not forehead when washcloth touched to her face and then placed in her left hand; when comb place in left hand she did not attempt to comb hair even after combing it for her a little and placing comb back in her hand Upper Body Bathing: Total assistance, Sitting Lower Body Bathing: Total assistance, Bed level Upper Body Dressing : Maximal assistance, Sitting Lower Body Dressing: Total assistance, Bed level    Mobility  Overal bed mobility: Needs Assistance Bed Mobility: Rolling Rolling: Max assist Supine to sit: Mod assist, Max assist General bed mobility comments: Mod-max +1 to move to edge of bed.  Max VCs for sequencing    Transfers  Overall transfer level: Needs assistance Equipment used: Ambulation equipment used(sara + sit to stand) Transfer via Lift Equipment: Marketing executive Transfers: Sit  to/from Stand Sit to Stand: Max assist, +2 physical assistance General transfer comment: Utilized sara + lift to achieve standing.  Pt required cues for hand placement and hand over hand assistance to keep L elbow on platform.  Pt is recepetive to cues for upright trunk control.  Pt required cues for upper trunk control and to keep arms braced on platform.    Ambulation / Gait / Stairs / Wheelchair Mobility  Ambulation/Gait Ambulation/Gait assistance: (NT unable)    Posture / Balance Dynamic Sitting Balance Sitting balance - Comments: Pt with heavy pushing with LUE toward rt. Initially requiring max assist to maintain sitting EOB. Placed bedside table in front of pt to prop forearms but pt just leaned head forward on table. Removed table and sat in front of pt. Had pt prop on lt elbow and then returned pt to midline with improvement in sitting balance requiring only min assist for 30-40 sec before pushing again. Balance Overall balance assessment: Needs assistance Sitting-balance support: Single extremity supported Sitting balance-Leahy Scale: Zero Sitting balance - Comments: Pt with heavy pushing with LUE toward rt. Initially requiring max assist to maintain sitting EOB. Placed bedside table in front of pt to prop forearms but pt just leaned head forward on table. Removed table and sat in front of pt. Had pt prop on lt elbow and then returned pt to midline with improvement in sitting balance requiring only min assist for 30-40 sec before pushing again. Postural control: Right lateral lean Standing balance-Leahy Scale: Zero    Special needs/care consideration BiPAP/CPAP CPM Continuous Drip IV Dialysis Life Vest Oxygen Special Bed Trach Size Wound Vac  Skin                              Bowel mgmt:LBM 12/17 incontinent Bladder mgmt: external catheter Diabetic mgmt  Behavioral consideration  Chemo/radiation  Designated visitor is spouse [name]   Previous Home Environment  Living  Arrangements: Spouse/significant other  Lives With: Spouse Available Help at Discharge: Family, Available 24 hours/day(spouse and hired assist) Type of Home: [place]: Able to live on main level with bedroom/bathroom, Full bath on main level Home Access: Stairs to enter Entrance Stairs-Rails: None Entrance Stairs-Number of Steps: 2 to 3 Bathroom Shower/Tub: Tub/shower unit, Multimedia programmer: Standard Bathroom Accessibility: Yes How Accessible: Accessible via walker [place]: No Additional Comments: verified with spouse  Discharge Living Setting Plans for Discharge Living Setting: Patient's home, Lives with (comment)(spouse) Type of Home at Discharge: House Discharge Home Layout: Full  bath on main level, Able to live on main level with bedroom/bathroom, Two level Discharge Home Access: Stairs to enter Entrance Stairs-Rails: None Entrance Stairs-Number of Steps: 2 Discharge Bathroom Shower/Tub: Tub/shower unit, Horticulturist, commercial: Standard Discharge Bathroom Accessibility: Yes How Accessible: Accessible via walker Does the patient have any problems obtaining your medications?: No  Social/Family/Support Systems Patient Roles: 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) Contact Information: spouse Systems developer Anticipated Caregiver: spouse and hired Soil scientist Information: see above Ability/Limitations of Caregiver: spouse work Pharmacist, hospital but will take Event organiser and hire Restaurant manager, fast food Availability: 24/7 Discharge Plan Discussed with Primary Caregiver: Yes Is Caregiver In Agreement with Plan?: No Does Caregiver/Family have Issues with Lodging/Transportation while Pt is in Rehab?: No   Goals/Additional Needs Patient/Family Goal for Rehab: min asisst with PT, OT and SLP Expected length of stay: ELOS 21 to 28 days Cultural Considerations: patient speaks fluent 5  languages Pt/Family Agrees to Admission and willing to participate: Yes Program Orientation Provided & Reviewed with Pt/Caregiver Including Roles  & Responsibilities: Yes   Decrease burden of Care through IP rehab admission:  Possible need for SNF placement upon discharge:n/a  Patient Condition: This patient's medical and functional status has changed since the consult dated 07/13/2019 in which the Rehabilitation Physician determined and documented that the patient was potentially appropriate for intensive rehabilitative care in an inpatient rehabilitation facility. Issues have been addressed and update has been discussed with Dr. Posey Pronto and patient now appropriate for inpatient rehabilitation. Will admit to inpatient rehab Saturday 12/19 when bed is available.  Preadmission Screen Completed By:  Cleatrice Burke, RN, 07/17/2019 5:19 PM ______________________________________________________________________   Discussed status with Dr. Posey Pronto on 07/17/2019 at  1730 and received approval for admission Saturday 12/19 when bed is available..  Admission Coordinator:  Cleatrice Burke, time [id number] Date 07/17/2019

## 2019-07-17 NOTE — Progress Notes (Signed)
Inpatient Rehabilitation Admissions Coordinator  I await insurance approval and bed available to admit pt to CIR this weekend pending insurance approval which I am facilitating now.  Danne Baxter, RN, MSN Rehab Admissions Coordinator [phone] 07/17/2019 11:16 AM

## 2019-07-17 NOTE — Progress Notes (Signed)
Nutrition Follow-up  DOCUMENTATION CODES:   Not applicable  INTERVENTION:  Continue Ensure Enlive po BID, each supplement provides 350 kcal and 20 grams of protein.  Encourage adequate PO intake.   NUTRITION DIAGNOSIS:   Inadequate oral intake related to acute illness as evidenced by NPO status; diet advanced; improved  GOAL:   Patient will meet greater than or equal to 90% of their needs; met  MONITOR:   PO intake, Supplement acceptance, Labs, Weight trends  REASON FOR ASSESSMENT:   Consult Enteral/tube feeding initiation and management  ASSESSMENT:   53 yo female admitted with ICH with HTN emergency. PMH includes HTN   12/14 10 fr small bore feeding tube, TF started 12/15 Diet advanced to Dysphagia 2, Thins 12/16 Liquid consistency changed to nectar thick  Tube feeding and Cortrak NGT discontinued 12/16. Pt eating well. Meal completion 90-100%. Pt currently has ensure ordered and has been consuming them. RD to continue with current orders to aid in caloric and protein needs. Labs and medications reviewed.   Diet Order:   Diet Order            DIET DYS 2 Room service appropriate? Yes; Fluid consistency: Nectar Thick  Diet effective now              EDUCATION NEEDS:   Not appropriate for education at this time  Skin:  Skin Assessment: Reviewed RN Assessment  Last BM:  12/17  Height:   Ht Readings from Last 1 Encounters:  07/17/18 '5\' 7"'$  (1.702 m)    Weight:   Wt Readings from Last 1 Encounters:  07/16/19 61.4 kg    Ideal Body Weight:  61.36 kg  BMI:  Body mass index is 21.2 kg/m.  Estimated Nutritional Needs:   Kcal:  1680-1850 kcals  Protein:  84-93 g  Fluid:  >/= 1.7 L    Corrin Parker, MS, RD, LDN Pager # [phone] After hours/ weekend pager # [phone]

## 2019-07-17 NOTE — Progress Notes (Signed)
Did well most of the day while her husband had  been in the room but has been pulling and trying to climb out of bed and pulling off tele and pulled out purwick.  Was not following directions and all interventions attempted to keep her in the bed failed so I paged MD to see if she can have a left wrist restraint to assist.

## 2019-07-18 ENCOUNTER — Inpatient Hospital Stay (HOSPITAL_COMMUNITY)
Admission: RE | Admit: 2019-07-18 | Discharge: 2019-08-21 | DRG: 057 | Disposition: A | Discharge status: Home Health | Payer: Managed Care, Other (non HMO) | Source: Intra-hospital | Attending: Physical Medicine & Rehabilitation | Admitting: Physical Medicine & Rehabilitation

## 2019-07-18 ENCOUNTER — Other Ambulatory Visit: Payer: Self-pay

## 2019-07-18 ENCOUNTER — Encounter (HOSPITAL_COMMUNITY): Payer: Self-pay | Admitting: Physical Medicine & Rehabilitation

## 2019-07-18 DIAGNOSIS — I639 Cerebral infarction, unspecified: Secondary | ICD-10-CM | POA: Diagnosis not present

## 2019-07-18 DIAGNOSIS — D696 Thrombocytopenia, unspecified: Secondary | ICD-10-CM | POA: Diagnosis not present

## 2019-07-18 DIAGNOSIS — I69151 Hemiplegia and hemiparesis following nontraumatic intracerebral hemorrhage affecting right dominant side: Secondary | ICD-10-CM | POA: Diagnosis present

## 2019-07-18 DIAGNOSIS — D72829 Elevated white blood cell count, unspecified: Secondary | ICD-10-CM | POA: Diagnosis present

## 2019-07-18 DIAGNOSIS — I69120 Aphasia following nontraumatic intracerebral hemorrhage: Secondary | ICD-10-CM | POA: Diagnosis not present

## 2019-07-18 DIAGNOSIS — Z820 Family history of epilepsy and other diseases of the nervous system: Secondary | ICD-10-CM | POA: Diagnosis not present

## 2019-07-18 DIAGNOSIS — R1310 Dysphagia, unspecified: Secondary | ICD-10-CM | POA: Diagnosis not present

## 2019-07-18 DIAGNOSIS — Z8249 Family history of ischemic heart disease and other diseases of the circulatory system: Secondary | ICD-10-CM

## 2019-07-18 DIAGNOSIS — Z791 Long term (current) use of non-steroidal anti-inflammatories (NSAID): Secondary | ICD-10-CM | POA: Diagnosis not present

## 2019-07-18 DIAGNOSIS — Z87442 Personal history of urinary calculi: Secondary | ICD-10-CM

## 2019-07-18 DIAGNOSIS — E8809 Other disorders of plasma-protein metabolism, not elsewhere classified: Secondary | ICD-10-CM | POA: Diagnosis not present

## 2019-07-18 DIAGNOSIS — F419 Anxiety disorder, unspecified: Secondary | ICD-10-CM | POA: Diagnosis present

## 2019-07-18 DIAGNOSIS — A499 Bacterial infection, unspecified: Secondary | ICD-10-CM | POA: Diagnosis not present

## 2019-07-18 DIAGNOSIS — N390 Urinary tract infection, site not specified: Secondary | ICD-10-CM | POA: Diagnosis not present

## 2019-07-18 DIAGNOSIS — Z833 Family history of diabetes mellitus: Secondary | ICD-10-CM

## 2019-07-18 DIAGNOSIS — B9620 Unspecified Escherichia coli [E. coli] as the cause of diseases classified elsewhere: Secondary | ICD-10-CM | POA: Diagnosis not present

## 2019-07-18 DIAGNOSIS — Z681 Body mass index (BMI) 19 or less, adult: Secondary | ICD-10-CM | POA: Diagnosis not present

## 2019-07-18 DIAGNOSIS — Z87891 Personal history of nicotine dependence: Secondary | ICD-10-CM

## 2019-07-18 DIAGNOSIS — R4701 Aphasia: Secondary | ICD-10-CM | POA: Diagnosis not present

## 2019-07-18 DIAGNOSIS — R32 Unspecified urinary incontinence: Secondary | ICD-10-CM | POA: Diagnosis not present

## 2019-07-18 DIAGNOSIS — Z8349 Family history of other endocrine, nutritional and metabolic diseases: Secondary | ICD-10-CM

## 2019-07-18 DIAGNOSIS — E441 Mild protein-calorie malnutrition: Secondary | ICD-10-CM | POA: Diagnosis present

## 2019-07-18 DIAGNOSIS — I619 Nontraumatic intracerebral hemorrhage, unspecified: Secondary | ICD-10-CM | POA: Diagnosis present

## 2019-07-18 DIAGNOSIS — D62 Acute posthemorrhagic anemia: Secondary | ICD-10-CM | POA: Diagnosis not present

## 2019-07-18 DIAGNOSIS — G936 Cerebral edema: Secondary | ICD-10-CM

## 2019-07-18 DIAGNOSIS — E870 Hyperosmolality and hypernatremia: Secondary | ICD-10-CM | POA: Diagnosis present

## 2019-07-18 DIAGNOSIS — Z7951 Long term (current) use of inhaled steroids: Secondary | ICD-10-CM | POA: Diagnosis not present

## 2019-07-18 DIAGNOSIS — Z888 Allergy status to other drugs, medicaments and biological substances status: Secondary | ICD-10-CM | POA: Diagnosis not present

## 2019-07-18 DIAGNOSIS — R7401 Elevation of levels of liver transaminase levels: Secondary | ICD-10-CM | POA: Diagnosis present

## 2019-07-18 DIAGNOSIS — Z79899 Other long term (current) drug therapy: Secondary | ICD-10-CM | POA: Diagnosis not present

## 2019-07-18 DIAGNOSIS — I69191 Dysphagia following nontraumatic intracerebral hemorrhage: Secondary | ICD-10-CM

## 2019-07-18 DIAGNOSIS — I10 Essential (primary) hypertension: Secondary | ICD-10-CM | POA: Diagnosis present

## 2019-07-18 DIAGNOSIS — Z880 Allergy status to penicillin: Secondary | ICD-10-CM

## 2019-07-18 DIAGNOSIS — E785 Hyperlipidemia, unspecified: Secondary | ICD-10-CM | POA: Diagnosis present

## 2019-07-18 DIAGNOSIS — E46 Unspecified protein-calorie malnutrition: Secondary | ICD-10-CM

## 2019-07-18 DIAGNOSIS — I610 Nontraumatic intracerebral hemorrhage in hemisphere, subcortical: Secondary | ICD-10-CM

## 2019-07-18 DIAGNOSIS — R1313 Dysphagia, pharyngeal phase: Secondary | ICD-10-CM | POA: Diagnosis not present

## 2019-07-18 LAB — CREATININE, SERUM
Creatinine, Ser: 0.89 mg/dL (ref 0.44–1.00)
GFR calc Af Amer: >60 mL/min (ref >60)
GFR calc non Af Amer: >60 mL/min (ref >60)

## 2019-07-18 LAB — CBC
HCT: 43.5 % (ref 36.0–46.0)
HCT: 44.9 % (ref 36.0–46.0)
Hemoglobin: 14 g/dL (ref 12.0–15.0)
Hemoglobin: 14.9 g/dL (ref 12.0–15.0)
MCH: 30.3 pg (ref 26.0–34.0)
MCH: 30.7 pg (ref 26.0–34.0)
MCHC: 32.2 g/dL (ref 30.0–36.0)
MCHC: 33.2 g/dL (ref 30.0–36.0)
MCV: 94.2 fL (ref 80.0–100.0)
MCV: 92.4 fL (ref 80.0–100.0)
Platelets: 175 10*3/uL (ref 150–400)
Platelets: 205 10*3/uL (ref 150–400)
RBC: 4.62 MIL/uL (ref 3.87–5.11)
RBC: 4.86 MIL/uL (ref 3.87–5.11)
RDW: 13.1 % (ref 11.5–15.5)
RDW: 13.1 % (ref 11.5–15.5)
WBC: 8.3 10*3/uL (ref 4.0–10.5)
WBC: 11.5 10*3/uL — ABNORMAL HIGH (ref 4.0–10.5)
nRBC: 0 % (ref 0.0–0.2)
nRBC: 0 % (ref 0.0–0.2)

## 2019-07-18 LAB — GLUCOSE, CAPILLARY
Glucose-Capillary: 133 mg/dL — ABNORMAL HIGH (ref 70–99)
Glucose-Capillary: 179 mg/dL — ABNORMAL HIGH (ref 70–99)
Glucose-Capillary: 93 mg/dL (ref 70–99)
Glucose-Capillary: 165 mg/dL — ABNORMAL HIGH (ref 70–99)

## 2019-07-18 LAB — BASIC METABOLIC PANEL
Anion gap: 9 (ref 5–15)
BUN: 26 mg/dL — ABNORMAL HIGH (ref 6–20)
CO2: 29 mmol/L (ref 22–32)
Calcium: 9.8 mg/dL (ref 8.9–10.3)
Chloride: 113 mmol/L — ABNORMAL HIGH (ref 98–111)
Creatinine, Ser: 0.7 mg/dL (ref 0.44–1.00)
GFR calc Af Amer: >60 mL/min (ref >60)
GFR calc non Af Amer: >60 mL/min (ref >60)
Glucose, Bld: 159 mg/dL — ABNORMAL HIGH (ref 70–99)
Potassium: 4 mmol/L (ref 3.5–5.1)
Sodium: 151 mmol/L — ABNORMAL HIGH (ref 135–145)

## 2019-07-18 MED ORDER — HEPARIN SODIUM (PORCINE) 5000 UNIT/ML IJ SOLN
5000.0000 [IU] | Freq: Three times a day (TID) | INTRAMUSCULAR | Status: DC
  Administered 2019-07-18 – 2019-07-21 (×8): 5000 [IU] via SUBCUTANEOUS
  Filled 2019-07-18 (×8): qty 1

## 2019-07-18 MED ORDER — ACETAMINOPHEN 325 MG PO TABS
650.0000 mg | ORAL_TABLET | ORAL | Status: DC | PRN
  Administered 2019-07-23 – 2019-08-17 (×10): 650 mg via ORAL
  Filled 2019-07-18 (×12): qty 2

## 2019-07-18 MED ORDER — ACETAMINOPHEN 160 MG/5ML PO SOLN
650.0000 mg | ORAL | Status: DC | PRN
  Filled 2019-07-18: qty 20.3

## 2019-07-18 MED ORDER — AMLODIPINE BESYLATE 10 MG PO TABS
10.0000 mg | ORAL_TABLET | Freq: Every day | ORAL | Status: DC
  Administered 2019-07-19 – 2019-07-23 (×5): 10 mg via ORAL
  Filled 2019-07-18 (×6): qty 1

## 2019-07-18 MED ORDER — HEPARIN SODIUM (PORCINE) 5000 UNIT/ML IJ SOLN: 5000.0000 [IU] | Freq: Three times a day (TID) | INTRAMUSCULAR | Status: DC

## 2019-07-18 MED ORDER — ENSURE ENLIVE PO LIQD
237.0000 mL | Freq: Two times a day (BID) | ORAL | Status: DC
  Administered 2019-07-19 – 2019-08-20 (×60): 237 mL via ORAL

## 2019-07-18 MED ORDER — RESOURCE THICKENUP CLEAR PO POWD
ORAL | Status: DC | PRN
  Filled 2019-07-18: qty 125

## 2019-07-18 MED ORDER — ACETAMINOPHEN 650 MG RE SUPP: 650.0000 mg | RECTAL | Status: DC | PRN

## 2019-07-18 MED ORDER — SENNOSIDES-DOCUSATE SODIUM 8.6-50 MG PO TABS
1.0000 | ORAL_TABLET | Freq: Two times a day (BID) | ORAL | Status: DC
  Administered 2019-07-18 – 2019-08-21 (×63): 1 via ORAL
  Filled 2019-07-18 (×63): qty 1

## 2019-07-18 MED ORDER — SORBITOL 70 % SOLN
30.0000 mL | Freq: Every day | Status: DC | PRN
  Filled 2019-07-18: qty 30

## 2019-07-18 MED ORDER — LISINOPRIL 20 MG PO TABS
20.0000 mg | ORAL_TABLET | Freq: Two times a day (BID) | ORAL | Status: DC
  Administered 2019-07-18 – 2019-07-23 (×11): 20 mg via ORAL
  Filled 2019-07-18 (×12): qty 1

## 2019-07-18 NOTE — Discharge Summary (Addendum)
Patient ID: Melanie Madden   MRN: [id number]      DOB: 21-Jul-1966  Date of Admission: 07/09/2019 Date of Discharge: 07/18/2019  Attending Physician:  No att. providers found, Stroke MD Consultant(s):  Treatment Team:  Stroke, Md, MD rehabilitation medicine - Kirsteins, [name], MD Patient's PCP:  Fanny Bien, MD  DISCHARGE DIAGNOSIS:  Large left basal ganglia ICH, most consistent with hypertensive hemorrhage - volume of 44 cc  Active Problems: ICH (intracerebral hemorrhage) ([place]) Cytotoxic brain edema ([place]) Possible small meningioma. Small volume subdural hematoma less likely. Follow-up post-contrast brain MRI would be necessary to confirm. Hypokalemia - resolved Mild Leukocytosis - resolved  Malnutrition Migraines Hypertension Hyperglycemia Mildly elevated Liver enzymes     Past Medical History:  Diagnosis Date   Allergy    Anxiety    Arthritis    High triglycerides    Kidney stones    Migraines    Recurrent sinus infections    Past Surgical History:  Procedure Laterality Date   ANTERIOR CRUCIATE LIGAMENT REPAIR Left    BUNIONECTOMY Right    LASIK     LITHOTRIPSY     MENISCUS REPAIR Left    NASAL SEPTUM SURGERY     SHOULDER SURGERY     VARICOSE VEIN SURGERY Left     HOSPITAL MEDICATIONS   HOME MEDICATIONS PRIOR TO ADMISSION Facility-Administered Medications Prior to Admission  Medication Dose Route Frequency Provider Last Rate Last Admin   0.9 %  sodium chloride infusion  500 mL Intravenous Continuous Nandigam, Kavitha V, MD       Medications Prior to Admission  Medication Sig Dispense Refill   acetaminophen (TYLENOL) 500 MG tablet Take 1 tablet (500 mg total) by mouth every 6 (six) hours as needed. (Patient taking differently: Take 500 mg by mouth every 6 (six) hours as needed for mild pain. ) 30 tablet 0   B Complex-C (B-COMPLEX WITH VITAMIN C) tablet Take 1 tablet by mouth daily.     BIOTIN PO Take by mouth.     cetirizine (ZYRTEC) 10 MG  tablet Take 10 mg by mouth daily.     cholecalciferol (VITAMIN D) 1000 units tablet Take 1,000 Units by mouth daily. 2,000 units daily     Cyanocobalamin (VITAMIN B-12 PO) Take by mouth.     docusate sodium (COLACE) 100 MG capsule Take 100 mg by mouth daily.     Flaxseed, Linseed, (FLAX SEEDS PO) Take 1 tablet by mouth daily.     fluticasone (FLONASE) 50 MCG/ACT nasal spray Place 1 spray into both nostrils 2 (two) times daily.     Ginkgo Biloba (GINKGO PO) Take by mouth.     hydrOXYzine (ATARAX/VISTARIL) 25 MG tablet Take 1 tablet (25 mg total) by mouth 3 (three) times daily as needed. (Patient not taking: Reported on 01/29/2018) 90 tablet 1   ibuprofen (ADVIL,MOTRIN) 600 MG tablet Take 1 tablet (600 mg total) by mouth every 6 (six) hours as needed. 30 tablet 0   ipratropium (ATROVENT) 0.03 % nasal spray Place into the nose.     magnesium oxide (MAG-OX) 400 (241.3 Mg) MG tablet Take 400 mg by mouth daily.     medroxyPROGESTERone (DEPO-PROVERA) 150 MG/ML injection Inject 150 mg into the muscle every 3 (three) months.      methocarbamol (ROBAXIN) 500 MG tablet Take 1 tablet (500 mg total) by mouth every 8 (eight) hours as needed for muscle spasms. 20 tablet 0   naproxen (NAPROSYN) 500 MG tablet Take  1 tablet (500 mg total) by mouth every 12 (twelve) hours as needed for mild pain or moderate pain. 30 tablet 0   NON FORMULARY caltrate bone plus minerals 600plus d3     Omega-3 Fatty Acids (FISH OIL) 1200 MG CPDR Take by mouth.     Red Yeast Rice Extract (RED YEAST RICE PO) Take by mouth.     UNABLE TO FIND verisol collagen powder     UNABLE TO FIND cbd oil     VITAMIN E PO Take by mouth.     zinc sulfate (ZINC-220) 220 (50 Zn) MG capsule Take 220 mg by mouth daily.      LABORATORY STUDIES CBC    Component Value Date/Time   WBC 8.3 07/18/2019 0316   RBC 4.62 07/18/2019 0316   HGB 14.0 07/18/2019 0316   HCT 43.5 07/18/2019 0316   PLT 175 07/18/2019 0316   MCV 94.2 07/18/2019 0316   MCH 30.3  07/18/2019 0316   MCHC 32.2 07/18/2019 0316   RDW 13.1 07/18/2019 0316   LYMPHSABS 3.2 07/09/2019 1949   MONOABS 0.8 07/09/2019 1949   EOSABS 0.1 07/09/2019 1949   BASOSABS 0.0 07/09/2019 1949   CMP    Component Value Date/Time   NA 151 (H) 07/18/2019 0316   K 4.0 07/18/2019 0316   CL 113 (H) 07/18/2019 0316   CO2 29 07/18/2019 0316   GLUCOSE 159 (H) 07/18/2019 0316   BUN 26 (H) 07/18/2019 0316   CREATININE 0.70 07/18/2019 0316   CALCIUM 9.8 07/18/2019 0316   PROT 6.7 07/13/2019 0015   ALBUMIN 3.5 07/13/2019 0015   AST 12 (L) 07/13/2019 0015   ALT 15 07/13/2019 0015   ALKPHOS 55 07/13/2019 0015   BILITOT 1.3 (H) 07/13/2019 0015   GFRNONAA >60 07/18/2019 0316   GFRAA >60 07/18/2019 0316   COAGS Lab Results  Component Value Date   INR 1.0 07/09/2019   Lipid Panel    Component Value Date/Time   CHOL 265 (H) 07/10/2019 1145   TRIG 166 (H) 07/14/2019 0420   HDL 81 07/10/2019 1145   CHOLHDL 3.3 07/10/2019 1145   VLDL 28 07/10/2019 1145   LDLCALC 156 (H) 07/10/2019 1145   HgbA1C  Lab Results  Component Value Date   HGBA1C 5.3 07/10/2019   Urinalysis No results found for: COLORURINE, APPEARANCEUR, LABSPEC, PHURINE, GLUCOSEU, HGBUR, BILIRUBINUR, KETONESUR, PROTEINUR, UROBILINOGEN, NITRITE, LEUKOCYTESUR Urine Drug Screen     Component Value Date/Time   LABOPIA NONE DETECTED 07/10/2019 [place] 07/10/2019 1126   LABBENZ NONE DETECTED 07/10/2019 1126   AMPHETMU NONE DETECTED 07/10/2019 1126   THCU NONE DETECTED 07/10/2019 1126   LABBARB NONE DETECTED 07/10/2019 1126    Alcohol Level No results found for: [facility]   SIGNIFICANT DIAGNOSTIC STUDIES  DG Abd 1 View 07/13/2019 CLINICAL DATA:  Feeding tube placement EXAM: ABDOMEN - 1 VIEW COMPARISON:  None FINDINGS: Feeding tube plate was placed by the technologist under fluoroscopic visualization. Feeding tube traverses stomach and duodenum with tip projecting over ligament of Treitz. Contrast  opacifies the gastric lumen, duodenal bulb, and proximal jejunal loops.  IMPRESSION: Tip of feeding tube projects over the ligament of Treitz.   CT HEAD WO CONTRAST 07/13/2019 CLINICAL DATA:  Follow-up intracranial hemorrhage. EXAM: CT HEAD WITHOUT CONTRAST TECHNIQUE: Contiguous axial images were obtained from the base of the skull through the vertex without intravenous contrast. COMPARISON:  Four days ago FINDINGS: Brain: Given differences in slice selection unchanged size and shape of the  left cerebral hemorrhage centered at the basal ganglia and deep white matter, up to 5 x 4 cm on axial slices. There is expected increase in surrounding edema when compared to CT, stable from brain MRI 2 days ago. Midline shift measures 6-7 mm, stable from brain MRI. No evidence of interval hemorrhage. No hydrocephalus. Subtle mass along the left petrous ridge better seen on brain MRI. Vascular: Negative Skull: Negative Sinuses/Orbits: Negative  IMPRESSION:  1. Compared to brain MRI 2 days ago unchanged left cerebral hemorrhage with vasogenic edema and 6 mm of midline shift.  2. 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  MR ANGIO HEAD WO CONTRAST 07/11/2019 CLINICAL DATA:  53 year old female code stroke presentation on 07/09/2019 with right side deficits. Found to have left basal ganglia intra-axial hemorrhage on CT at that time. EXAM: MRI HEAD WITHOUT CONTRAST MRA HEAD WITHOUT CONTRAST TECHNIQUE: Multiplanar, multiecho pulse sequences of the brain and surrounding structures were obtained without intravenous contrast. Angiographic images of the head were obtained using MRA technique without contrast. COMPARISON:  Head CT 07/09/2019. FINDINGS: MRI HEAD FINDINGS Brain: Large intra-axial predominantly dark T2 and mixed intermediate to bright T1 signal hemorrhage in the left hemisphere, centered at the lentiform, encompassing 58 x 46 x 40 millimeters (AP by transverse by CC). Estimated blood volume 53  milliliters. Surrounding edema. Regional mass effect with partial effacement of the left lateral ventricle and 6-7 millimeters of rightward midline shift. Basilar cisterns remain patent. Trace superimposed intraventricular hemorrhage layering in the occipital horns. No ventricular trapping or transependymal edema. No extra-axial extension of blood products is evident. Associated substantial susceptibility artifact on DWI, but superimposed confluent 3 centimeter area of restricted diffusion superior to the blood products (series 3, image 22 and series 350, image 22). No other restricted diffusion identified. There is a small indeterminate oval or biconvex 15 x 7 x 11 millimeter extra-axial collection or mass in the left prepontine cistern laterally best seen on series 6, image 11. This was subtle and isodense to brain on the comparison CT. This is mostly not decreased in signal on SWI (series 5, image 44). No other extra-axial collection or intracranial mass lesion. Scattered bilateral cerebral white matter T2 and FLAIR hyperintensity in both hemispheres superimposed on the acute findings. Negative right deep gray nuclei. Aside from edema in the left midbrain negative brainstem and pons. Cervicomedullary junction and pituitary are within normal limits. Vascular: Major intracranial vascular flow voids are preserved. Skull and upper cervical spine: Negative visible cervical spine. Visualized bone marrow signal is within normal limits. Sinuses/Orbits: Negative orbits. Paranasal Visualized paranasal sinuses and mastoids are stable and well pneumatized. Other: Grossly normal internal auditory structures. MRA HEAD FINDINGS Antegrade flow in the posterior circulation with mildly dominant right vertebral artery. Patent distal vertebral arteries without stenosis. Normal PICA origins and vertebrobasilar junction. 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 Patent SCA and PCA origins. Diminutive or  absent posterior communicating arteries. Bilateral PCA branches are within normal limits. Antegrade flow in both ICA siphons. No siphon stenosis. Normal ophthalmic artery origins. Patent carotid termini. Dominant left and diminutive right ACA A1 segments. Normal MCA and ACA origins. Anterior communicating artery and visible bilateral ACA branches are within normal limits. Right MCA M1 segment, right MCA bifurcation, and visible right MCA branches are within normal limits. Left MCA M1 segment and trifurcation appear patent and normal. Visible left MCA branches appear normal aside from mild lateral mass effect. No abnormal vascularity is evident about the  intra-axial hemorrhage. IMPRESSION:  1. Large intra-axial hemorrhage in the left hemisphere centered at the left lentiform nucleus with estimated 53 mL volume appears not significantly changed in size since 07/09/2019. Superimposed restricted diffusion suggesting this is hemorrhagic transformation of an ischemic infarct. Surrounding edema and regional mass effect including mildly increased rightward midline shift of 6-7 mm. No ventricular trapping.  Basilar cisterns remain patent.  2. Trace intraventricular hemorrhage. No definite extra-axial extension of blood (although see #3).  3. Small indeterminate extra-axial collection or mass in the left prepontine cistern measuring 15 x 7 x 11 mm. Burtis Junes this is a small meningioma. Small volume subdural hematoma less likely.  Follow-up post-contrast brain MRI would be necessary to confirm.  4. 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   MR BRAIN WO CONTRAST 07/11/2019 CLINICAL DATA:  53 year old female code stroke presentation on 07/09/2019 with right side deficits. Found to have left basal ganglia intra-axial hemorrhage on CT at that time. EXAM: MRI HEAD WITHOUT CONTRAST MRA HEAD WITHOUT CONTRAST TECHNIQUE: Multiplanar, multiecho pulse sequences of the brain and surrounding structures  were obtained without intravenous contrast. Angiographic images of the head were obtained using MRA technique without contrast. COMPARISON:  Head CT 07/09/2019. FINDINGS: MRI HEAD FINDINGS Brain: Large intra-axial predominantly dark T2 and mixed intermediate to bright T1 signal hemorrhage in the left hemisphere, centered at the lentiform, encompassing 58 x 46 x 40 millimeters (AP by transverse by CC). Estimated blood volume 53 milliliters. Surrounding edema. Regional mass effect with partial effacement of the left lateral ventricle and 6-7 millimeters of rightward midline shift. Basilar cisterns remain patent. Trace superimposed intraventricular hemorrhage layering in the occipital horns. No ventricular trapping or transependymal edema. No extra-axial extension of blood products is evident. Associated substantial susceptibility artifact on DWI, but superimposed confluent 3 centimeter area of restricted diffusion superior to the blood products (series 3, image 22 and series 350, image 22). No other restricted diffusion identified. There is a small indeterminate oval or biconvex 15 x 7 x 11 millimeter extra-axial collection or mass in the left prepontine cistern laterally best seen on series 6, image 11. This was subtle and isodense to brain on the comparison CT. This is mostly not decreased in signal on SWI (series 5, image 44). No other extra-axial collection or intracranial mass lesion. Scattered bilateral cerebral white matter T2 and FLAIR hyperintensity in both hemispheres superimposed on the acute findings. Negative right deep gray nuclei. Aside from edema in the left midbrain negative brainstem and pons. Cervicomedullary junction and pituitary are within normal limits. Vascular: Major intracranial vascular flow voids are preserved. Skull and upper cervical spine: Negative visible cervical spine. Visualized bone marrow signal is within normal limits. Sinuses/Orbits: Negative orbits. Paranasal Visualized  paranasal sinuses and mastoids are stable and well pneumatized. Other: Grossly normal internal auditory structures. MRA HEAD FINDINGS Antegrade flow in the posterior circulation with mildly dominant right vertebral artery. Patent distal vertebral arteries without stenosis. Normal PICA origins and vertebrobasilar junction. 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 Patent SCA and PCA origins. Diminutive or absent posterior communicating arteries. Bilateral PCA branches are within normal limits. Antegrade flow in both ICA siphons. No siphon stenosis. Normal ophthalmic artery origins. Patent carotid termini. Dominant left and diminutive right ACA A1 segments. Normal MCA and ACA origins. Anterior communicating artery and visible bilateral ACA branches are within normal limits. Right MCA M1 segment, right MCA bifurcation, and visible right MCA branches are within normal limits. Left MCA M1  segment and trifurcation appear patent and normal. Visible left MCA branches appear normal aside from mild lateral mass effect. No abnormal vascularity is evident about the intra-axial hemorrhage.  IMPRESSION:  1. Large intra-axial hemorrhage in the left hemisphere centered at the left lentiform nucleus with estimated 53 mL volume appears not significantly changed in size since 07/09/2019. Superimposed restricted diffusion suggesting this is hemorrhagic transformation of an ischemic infarct. Surrounding edema and regional mass effect including mildly increased rightward midline shift of 6-7 mm. No ventricular trapping.  Basilar cisterns remain patent.  2. Trace intraventricular hemorrhage. No definite extra-axial extension of blood (although see #3).  3. Small indeterminate extra-axial collection or mass in the left prepontine cistern measuring 15 x 7 x 11 mm. Burtis Junes this is a small meningioma. Small volume subdural hematoma less likely. Follow-up post-contrast brain MRI would be necessary to confirm.  4.  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   ECHOCARDIOGRAM COMPLETE 07/10/2019 IMPRESSIONS  1. Left ventricular ejection fraction, by visual estimation, is 60 to 65%. The left ventricle has normal function. There is no left ventricular hypertrophy.  2. The left ventricle has no regional wall motion abnormalities.  3. Global right ventricle has normal systolic function.The right ventricular size is small. No increase in right ventricular wall thickness.  4. Left atrial size was normal.  5. Right atrial size was normal.  6. The mitral valve is normal in structure. No evidence of mitral valve regurgitation.  7. The tricuspid valve is normal in structure. Tricuspid valve regurgitation is trivial.  8. The aortic valve is normal in structure. Aortic valve regurgitation is not visualized.  9. The pulmonic valve was normal in structure. Pulmonic valve regurgitation is not visualized. 10. The atrial septum is grossly normal.   CT HEAD CODE STROKE WO CONTRAST 07/09/2019 CLINICAL DATA:  Code stroke. Leftward gaze with right-sided deficits EXAM: CT HEAD WITHOUT CONTRAST TECHNIQUE: Contiguous axial images were obtained from the base of the skull through the vertex without intravenous contrast. COMPARISON:  Head CT 01/21/2018 FINDINGS: Brain: There is an intraparenchymal hematoma centered in the left lentiform nucleus measuring 5.3 cm AP x 4.1 cm TV x 3.9 cm CC (volume = 44 cm^3). There is mass effect on the left lateral ventricle without hydrocephalus. There is 5 mm of rightward midline shift without frank subfalcine herniation. Vascular: No hyperdense vessel or unexpected calcification. Skull: Normal. Negative for fracture or focal lesion. Sinuses/Orbits: No acute finding. Other: None. ASPECTS [facility] Stroke [facility] Early CT Score) is not applicable in the setting of acute hemorrhage.  IMPRESSION:  1. Intraparenchymal hematoma with volume of 44 cc centered in the left basal ganglia,  most consistent with hypertensive hemorrhage.  2. 5 mm rightward midline shift.   [place] EKG SITE RITE 07/11/2019 If Site Rite image not attached, placement could not be confirmed due to current cardiac rhythm.     HISTORY OF PRESENT ILLNESS (From Dr Cecil Cobbs H&P on 07/09/19) Maycie Carley is a 52 y.o. female who presents with right-sided weakness that started abruptly after returning home from work.  Her husband states that she got home around 6 PM, and was relatively normal at that time.  She subsequently stated that she was not feeling well and sat down.  Her mental status then progressively declined and he called 911.  En route, code stroke was activated due to right-sided hemiparesis and after arrival she was taken for CT scan which reveals a large basal ganglia hemorrhage on the left. LKW: 6  PM tpa given?: No, ICH IR Thrombectomy? No, ICH Modified Rankin Scale: 0-Completely asymptomatic and back to baseline post- stroke  HOSPITAL COURSE  Ms. Clotilde Sisak is a 53 y.o. female with history of migraines and anxiety presenting with right sided weakness and AMS. She did not receive IV t-PA due to [place].   Stroke: left basal ganglia large ICH, most consistent with hypertensive hemorrhage Resultant global aphasia and dense right hemiplegia Code Stroke CT Head - Intraparenchymal hematoma with volume of 44 cc centered in the left basal ganglia, most consistent with hypertensive hemorrhage. 5 mm rightward midline shift.  MRI head - Large intra-axial hemorrhage in the left hemisphere centered at the left lentiform nucleus with estimated 53 mL volume appears not significantly changed in size since 07/09/2019. Mildly increased rightward midline shift of 6-7 mm. 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 CT head repeat 12/14 unchanged L cerebral hemorrhage w/ vasogenic edema and 51mm midline shift. 2D Echo - EF 60 - 65%. No cardiac source of emboli identified.   [place] Corona Virus 2 - negative LDL - 156 HgbA1c - 5.3 UDS - neg VTE prophylaxis - SCDs No antithrombotic prior to admission, now on No antithrombotic Therapy recommendations:  CIR Disposition:  Discharged to the Inpatient Rehabilitation Unit  Cerebral edema CT head showed midline shift 5 mm MRI repeat showed midline shift 6 to 7 mm CT repeat stable hematoma and MLS at 59mm Central Line placed 07/11/19 On 3% saline -> off now  Na 137->138->138->157->155->162->161->160->156->152->148 Sodium daily   Dysphagia Due to [place] and aphasia Speech on board Cortrak by flouro placed 12/14, TF and cortrak discontinued 12/16 On D2 thin liquid diet   Hypertension Home BP meds: none  Off cleviprex On lisinopril 20 bid, norvasc 10 SBP goal < 160 mm Hg Stable Long-term BP goal normotensive   Hyperlipidemia Home Lipid lowering medication: fish oil, red rice yeast LDL 156, goal < 70 Current lipid lowering medication: on lipitor 20  Continue statin on discharge   Other Stroke Risk Factors Former cigarette smoker - quit ETOH use, advised to drink no more than 1 alcoholic beverage per day. Migraines   Other Active Problems Possible small meningioma. Small volume subdural hematoma less likely. Follow-up post-contrast brain MRI would be necessary to confirm. Hypokalemia - 2.9 ->3.4->3.5->3.4 - supplement - 4.0 Mild Leukocytosis, resolved WBC 6.7-7.8 - monitor   RN Pressure Injury Documentation:     Malnutrition Type:  Nutrition Problem: Inadequate oral intake Etiology: acute illness   Malnutrition Characteristics:  Signs/Symptoms: NPO status   Nutrition Interventions:  Interventions: Ensure Enlive (each supplement provides 350kcal and 20 grams of protein), Tube feeding  DISCHARGE EXAM Vitals:   07/18/19 0338 07/18/19 0500 07/18/19 0818 07/18/19 1132  BP: (!) 143/88  135/88 140/77  Pulse: 89  (!) 102 (!) 110  Resp: 16  19 17   Temp: 98.1 F (36.7 C)  98.8 F (37.1 C)  98.6 F (37 C)  TempSrc: Oral  Oral Oral  SpO2: 99%  100% 100%  Weight:  61.7 kg    Height:       General - Well nourished, well developed, not in acute distress.   Ophthalmologic - fundi not visualized due to noncooperation.   Cardiovascular - Regular rhythm and rate   Neuro - awake alert, nonverbal, did not make any sounds today, not following commands, global aphasia.  PERRL, left gaze preference, slightly cross midline, right neglect, but able to track object from left to right,  not blinking to visual threat on the right.  Right facial droop, tongue protrusion not corporative.  Left upper and lower extremity spontaneous movement against gravity, purposeful.  Right upper extremity 0/5, flaccid.  Right lower extremity mild withdrawal to pain stimulation.  Decreased muscle tone on the right.  Babinski positive on the right. Sensation, coordination and gait not tested.    Discharge Diet   Diet Order     None      liquids  DISCHARGE PLAN Disposition:  Discharge to [facility] for ongoing PT, OT and ST No antithrombotic for secondary stroke prevention due to [place] Recommend ongoing risk factor control by Primary Care Physician at time of discharge from inpatient rehabilitation. Follow-up Fanny Bien, MD in 2 weeks following discharge from rehab. Follow-up in [facility] in 4 weeks following discharge from rehab, office to schedule an appointment.   48minutes were spent preparing discharge.  Mikey Bussing PA-C [facility] Pager [phone] 07/18/2019, 3:34 PM  I have personally obtained history,examined this patient, reviewed notes, independently viewed imaging studies, participated in medical decision making and plan of care.ROS completed by me personally and pertinent positives fully documented  I have made any additions or clarifications directly to the above note. Agree with note above.    Antony Contras,  MD Medical Director [facility] Pager: [phone] 07/18/2019 3:56 PM

## 2019-07-18 NOTE — H&P (Signed)
Physical Medicine and Rehabilitation Admission H&P    Chief Complaint  Patient presents with  . Code Stroke  : HPI: Melanie Madden is a 53 year old right-handed female with history of hypertension, anxiety, migraine headaches and tobacco abuse.  Per chart review and husband, due to cognition, patient lives with spouse and was independent prior to admission.  She presented on 07/09/2019 with right-sided weakness and aphasia after returning home from work.  Cranial CT scan showed intraparenchymal hematoma with a volume of 44 cc centered in the left basal ganglia most consistent with hypertensive hemorrhage.  5 mm rightward midline shift.  MRA of the head negative.  Echocardiogram with ejection fraction of 65%.  Patient did not receive TPA due to [place].  Admission labs SARS coronavirus negative.  Patient initially on 3% saline.  Cleviprex for blood pressure control later discontinued.  Her diet was advanced to dysphagia #2 nectar thick liquid.  She was cleared to begin subcutaneous heparin for DVT prophylaxis 07/14/2019.  Therapy evaluations completed and patient was admitted for a comprehensive rehab program.  Please see preadmission assessment from earlier today as well.  Review of Systems  Unable to perform ROS: Mental acuity   Past Medical History:  Diagnosis Date  . Allergy   . Anxiety   . Arthritis   . High triglycerides   . Kidney stones   . Migraines   . Recurrent sinus infections    Past Surgical History:  Procedure Laterality Date  . ANTERIOR CRUCIATE LIGAMENT REPAIR Left   . BUNIONECTOMY Right   . LASIK    . LITHOTRIPSY    . MENISCUS REPAIR Left   . NASAL SEPTUM SURGERY    . SHOULDER SURGERY    . VARICOSE VEIN SURGERY Left    Family History  Problem Relation Age of Onset  . Alzheimer's disease Mother   . Hypertension Mother   . Diabetes Mother   . Thyroid disease Mother   . Deep vein thrombosis Maternal Grandmother   . Colon cancer Neg Hx   . Colon polyps Neg Hx   .  Esophageal cancer Neg Hx   . Pancreatic cancer Neg Hx   . Rectal cancer Neg Hx   . Stomach cancer Neg Hx   . Breast cancer Neg Hx    Social History:  reports that she has quit smoking. She has never used smokeless tobacco. She reports current alcohol use. She reports that she does not use drugs. Allergies:  Allergies  Allergen Reactions  . Amoxicillin Nausea Only  . Shellfish Allergy Diarrhea and Nausea And Vomiting    [place]    Facility-Administered Medications Prior to Admission  Medication Dose Route Frequency Provider Last Rate Last Admin  . 0.9 %  sodium chloride infusion  500 mL Intravenous Continuous Nandigam, Venia Minks, MD       Medications Prior to Admission  Medication Sig Dispense Refill  . acetaminophen (TYLENOL) 500 MG tablet Take 1 tablet (500 mg total) by mouth every 6 (six) hours as needed. (Patient taking differently: Take 500 mg by mouth every 6 (six) hours as needed for mild pain. ) 30 tablet 0  . B Complex-C (B-COMPLEX WITH VITAMIN C) tablet Take 1 tablet by mouth daily.    Marland Kitchen BIOTIN PO Take by mouth.    . cetirizine (ZYRTEC) 10 MG tablet Take 10 mg by mouth daily.    . cholecalciferol (VITAMIN D) 1000 units tablet Take 1,000 Units by mouth daily. 2,000 units daily    .  Cyanocobalamin (VITAMIN B-12 PO) Take by mouth.    . docusate sodium (COLACE) 100 MG capsule Take 100 mg by mouth daily.    . Flaxseed, Linseed, (FLAX SEEDS PO) Take 1 tablet by mouth daily.    . fluticasone (FLONASE) 50 MCG/ACT nasal spray Place 1 spray into both nostrils 2 (two) times daily.    . Ginkgo Biloba (GINKGO PO) Take by mouth.    . hydrOXYzine (ATARAX/VISTARIL) 25 MG tablet Take 1 tablet (25 mg total) by mouth 3 (three) times daily as needed. (Patient not taking: Reported on 01/29/2018) 90 tablet 1  . ibuprofen (ADVIL,MOTRIN) 600 MG tablet Take 1 tablet (600 mg total) by mouth every 6 (six) hours as needed. 30 tablet 0  . ipratropium (ATROVENT) 0.03 % nasal spray Place into the nose.    .  magnesium oxide (MAG-OX) 400 (241.3 Mg) MG tablet Take 400 mg by mouth daily.    . medroxyPROGESTERone (DEPO-PROVERA) 150 MG/ML injection Inject 150 mg into the muscle every 3 (three) months.     . methocarbamol (ROBAXIN) 500 MG tablet Take 1 tablet (500 mg total) by mouth every 8 (eight) hours as needed for muscle spasms. 20 tablet 0  . naproxen (NAPROSYN) 500 MG tablet Take 1 tablet (500 mg total) by mouth every 12 (twelve) hours as needed for mild pain or moderate pain. 30 tablet 0  . NON FORMULARY caltrate bone plus minerals 600plus d3    . Omega-3 Fatty Acids (FISH OIL) 1200 MG CPDR Take by mouth.    . Red Yeast Rice Extract (RED YEAST RICE PO) Take by mouth.    Marland Kitchen UNABLE TO FIND verisol collagen powder    . UNABLE TO FIND cbd oil    . VITAMIN E PO Take by mouth.    . zinc sulfate (ZINC-220) 220 (50 Zn) MG capsule Take 220 mg by mouth daily.      Drug Regimen Review Drug regimen was reviewed and remains appropriate with no significant issues identified  Home: Home Living Family/patient expects to be discharged to:: Private residence Living Arrangements: Spouse/significant other Available Help at Discharge: Family, Available 24 hours/day(spouse and hired assist) Type of Home: House Home Access: Stairs to enter Technical brewer of Steps: 2 to 3 Entrance Stairs-Rails: None Home Layout: Able to live on main level with bedroom/bathroom, Full bath on main level Bathroom Shower/Tub: Tub/shower unit, Estate agent Accessibility: Yes Additional Comments: verified with spouse  Lives With: Spouse   Functional History: Prior Function Level of Independence: Independent Comments: works  Functional Status:  Mobility: Bed Mobility Overal bed mobility: Needs Assistance Bed Mobility: Rolling Rolling: Max assist Supine to sit: Mod assist, Max assist General bed mobility comments: Mod-max +1 to move to edge of bed.  Max VCs for  sequencing Transfers Overall transfer level: Needs assistance Equipment used: Ambulation equipment used(sara + sit to stand) Transfer via Lift Equipment: Marketing executive Transfers: Sit to/from Stand Sit to Stand: Max assist, +2 physical assistance General transfer comment: Utilized sara + lift to achieve standing.  Pt required cues for hand placement and hand over hand assistance to keep L elbow on platform.  Pt is recepetive to cues for upright trunk control.  Pt required cues for upper trunk control and to keep arms braced on platform. Ambulation/Gait Ambulation/Gait assistance: (NT unable)    ADL: ADL Overall ADL's : Needs assistance/impaired Eating/Feeding: NPO Grooming Details (indicate cue type and reason): Washed both sides of face/mouth but not forehead when washcloth touched  to her face and then placed in her left hand; when comb place in left hand she did not attempt to comb hair even after combing it for her a little and placing comb back in her hand Upper Body Bathing: Total assistance, Sitting Lower Body Bathing: Total assistance, Bed level Upper Body Dressing : Maximal assistance, Sitting Lower Body Dressing: Total assistance, Bed level  Cognition: Cognition Overall Cognitive Status: Impaired/Different from baseline Arousal/Alertness: Awake/alert Orientation Level: Oriented to person Attention: Sustained Sustained Attention: Impaired Sustained Attention Impairment: Verbal basic, Functional basic Awareness: Impaired Behaviors: Restless Cognition Arousal/Alertness: Awake/alert Behavior During Therapy: Restless, Impulsive Overall Cognitive Status: Impaired/Different from baseline Area of Impairment: Awareness, Safety/judgement, Following commands Following Commands: Follows one step commands inconsistently Safety/Judgement: Decreased awareness of safety, Decreased awareness of deficits Awareness: Intellectual General Comments: PTA started both socks and patient able to  complete donning with flexing forward from waist.  She does push and throw weight to the R.  While PTA was setting up room she pulled on rail to sit up with left hadn then threw weight to the R and hit her head on the bed rail. Difficult to assess due to: Impaired communication  Physical Exam: Blood pressure 140/77, pulse (!) 110, temperature 98.6 F (37 C), temperature source Oral, resp. rate 17, height 5\' 5"  (1.651 m), weight 61.7 kg, SpO2 100 %. Physical Exam  Vitals reviewed. Constitutional: She appears well-developed and well-nourished.  HENT:  Head: Normocephalic and atraumatic.  Eyes: EOM are normal. Right eye exhibits no discharge. Left eye exhibits no discharge.  Neck: No tracheal deviation present. No thyromegaly present.  Respiratory: No stridor. No respiratory distress.  GI: She exhibits no distension.  Musculoskeletal:     Comments: No edema or tenderness in extremities  Neurological: She is alert.  Nonverbal Global aphasia Exam limited due to lack of ability to follow commands, however seen spontaneously moving LUE and LLE appears to sense pain on right side No movement noted on RUE/RLE  Skin: Skin is warm and dry.  Psychiatric:  Unable to assess due to mentation    Results for orders placed or performed during the hospital encounter of 07/09/19 (from the past 48 hour(s))  Glucose, capillary     Status: Abnormal   Collection Time: 07/16/19  4:56 PM  Result Value Ref Range   Glucose-Capillary 160 (H) 70 - 99 mg/dL  Glucose, capillary     Status: Abnormal   Collection Time: 07/16/19  9:28 PM  Result Value Ref Range   Glucose-Capillary 129 (H) 70 - 99 mg/dL  Glucose, capillary     Status: Abnormal   Collection Time: 07/17/19  6:23 AM  Result Value Ref Range   Glucose-Capillary 122 (H) 70 - 99 mg/dL  Glucose, capillary     Status: Abnormal   Collection Time: 07/17/19  1:06 PM  Result Value Ref Range   Glucose-Capillary 177 (H) 70 - 99 mg/dL  Glucose, capillary      Status: Abnormal   Collection Time: 07/17/19  4:34 PM  Result Value Ref Range   Glucose-Capillary 176 (H) 70 - 99 mg/dL  Glucose, capillary     Status: Abnormal   Collection Time: 07/17/19  9:46 PM  Result Value Ref Range   Glucose-Capillary 171 (H) 70 - 99 mg/dL  CBC     Status: None   Collection Time: 07/18/19  3:16 AM  Result Value Ref Range   WBC 8.3 4.0 - 10.5 K/uL   RBC 4.62 3.87 - 5.11 MIL/uL  Hemoglobin 14.0 12.0 - 15.0 g/dL   HCT 43.5 36.0 - 46.0 %   MCV 94.2 80.0 - 100.0 fL   MCH 30.3 26.0 - 34.0 pg   MCHC 32.2 30.0 - 36.0 g/dL   RDW 13.1 11.5 - 15.5 %   Platelets 175 150 - 400 K/uL   nRBC 0.0 0.0 - 0.2 %    Comment: Performed at [facility], [place] [address] [place], [place] [id number]  Basic metabolic panel     Status: Abnormal   Collection Time: 07/18/19  3:16 AM  Result Value Ref Range   Sodium 151 (H) 135 - 145 mmol/L   Potassium 4.0 3.5 - 5.1 mmol/L   Chloride 113 (H) 98 - 111 mmol/L   CO2 29 22 - 32 mmol/L   Glucose, Bld 159 (H) 70 - 99 mg/dL   BUN 26 (H) 6 - 20 mg/dL   Creatinine, Ser 0.70 0.44 - 1.00 mg/dL   Calcium 9.8 8.9 - 10.3 mg/dL   GFR calc non Af Amer >60 >60 mL/min   GFR calc Af Amer >60 >60 mL/min   Anion gap 9 5 - 15    Comment: Performed at [facility], [place] [address], [place], [place] [address]  Glucose, capillary     Status: Abnormal   Collection Time: 07/18/19  6:12 AM  Result Value Ref Range   Glucose-Capillary 133 (H) 70 - 99 mg/dL  Glucose, capillary     Status: Abnormal   Collection Time: 07/18/19 11:30 AM  Result Value Ref Range   Glucose-Capillary 179 (H) 70 - 99 mg/dL   Comment 1 Notify RN    Comment 2 Document in Chart    No results found.     Medical Problem List and Plan: 1.  Right side weakness and aphasia secondary to left basal ganglia hypertensive hemorrhage  -patient may shower  -ELOS/Goals: 17-20 days/min a  Admit to CIR 2.  Antithrombotics: -DVT/anticoagulation: Subcutaneous heparin initiated  07/14/2019.  Monitor for any bleeding episodes  -antiplatelet therapy: N/A 3. Pain Management: Tylenol as needed 4. Mood: Provide emotional support  -antipsychotic agents: N/A 5. Neuropsych: This patient is not capable of making decisions on her own behalf. 6. Skin/Wound Care: Routine skin checks 7. Fluids/Electrolytes/Nutrition: Routine in and outs.  CMP ordered 8.  Hypertension.  Lisinopril 20 mg twice daily, Norvasc 10 mg daily.    Monitor with increased mobility 9.  Hypernatremia-likely due to hypertonic saline  Follow-up CMP 10.  Leukocytosis  WBCs 11.5 on 12/19, CBC ordered  Afebrile  Cathlyn Parsons, PA-C 07/18/2019  I have personally performed a face to face diagnostic evaluation, including, but not limited to relevant history and physical exam findings, of this patient and developed relevant assessment and plan.  Additionally, I have reviewed and concur with the physician assistant's documentation above.  Delice Lesch, MD, ABPMR  The patient's status has not changed. The original post admission physician evaluation remains appropriate, and any changes from the pre-admission screening or documentation from the acute chart are noted above.   Delice Lesch, MD, ABPMR

## 2019-07-19 ENCOUNTER — Inpatient Hospital Stay (HOSPITAL_COMMUNITY): Payer: Self-pay | Admitting: Speech Pathology

## 2019-07-19 ENCOUNTER — Inpatient Hospital Stay (HOSPITAL_COMMUNITY): Payer: Self-pay

## 2019-07-19 ENCOUNTER — Inpatient Hospital Stay (HOSPITAL_COMMUNITY): Payer: Self-pay | Admitting: Occupational Therapy

## 2019-07-19 DIAGNOSIS — E870 Hyperosmolality and hypernatremia: Secondary | ICD-10-CM

## 2019-07-19 DIAGNOSIS — I10 Essential (primary) hypertension: Secondary | ICD-10-CM

## 2019-07-19 DIAGNOSIS — D72829 Elevated white blood cell count, unspecified: Secondary | ICD-10-CM

## 2019-07-19 DIAGNOSIS — I610 Nontraumatic intracerebral hemorrhage in hemisphere, subcortical: Secondary | ICD-10-CM

## 2019-07-19 LAB — GLUCOSE, CAPILLARY
Glucose-Capillary: 142 mg/dL — ABNORMAL HIGH (ref 70–99)
Glucose-Capillary: 182 mg/dL — ABNORMAL HIGH (ref 70–99)
Glucose-Capillary: 144 mg/dL — ABNORMAL HIGH (ref 70–99)

## 2019-07-19 MED ORDER — CHLORHEXIDINE GLUCONATE CLOTH 2 % EX PADS: 6.0000 | MEDICATED_PAD | Freq: Every day | CUTANEOUS | Status: DC

## 2019-07-19 NOTE — Progress Notes (Signed)
Kirsteins, [name], MD  Physician  Physical Medicine and Rehabilitation  Consult Note  Signed  Date of Service:  07/13/2019 12:27 PM      Related encounter: ED to Hosp-Admission (Discharged) from 07/09/2019 in [facility]      Signed      Expand AllCollapse All   Show:Clear all [x] Manual[x] Template[] Copied  Added by: [x] Angiulli, Lavon Paganini, PA-C[x] Kirsteins, [name], MD  [] Hover for details          Physical Medicine and Rehabilitation Consult Reason for Consult: Decreased functional mobility with right side weakness Referring Physician: Dr.Xu     HPI: Melanie Madden is a 53 y.o. right-handed female with history of hypertension, anxiety, migraine headaches and tobacco abuse.  Per chart review patient lives with spouse independent prior to admission.  Presented 07/09/2019 with right side weakness and aphasia of acute onset after returning home from work.  Cranial CT scan showed intraparenchymal hematoma with volume of 44 cc centered in the left basal ganglia most consistent with hypertensive hemorrhage.  5 mm rightward midline shift.  MRA of the head negative.  Echocardiogram with ejection fraction of 65%.  Patient did not receive TPA due to [place].  Admission labs SARS coronavirus negative.  Patient initially on 3% saline.  Cleviprex for blood pressure control.  Patient is currently n.p.o.  Therapy evaluations completed recommendations of physical medicine rehab consult.       Review of Systems  Constitutional: Negative for chills and fever.  HENT: Negative for hearing loss.   Eyes: Negative for blurred vision and double vision.  Respiratory: Negative for cough and shortness of breath.   Cardiovascular: Negative for chest pain and palpitations.  Gastrointestinal: Positive for constipation. Negative for heartburn, nausea and vomiting.  Genitourinary: Negative for flank pain and hematuria.  Musculoskeletal: Positive for myalgias.  Neurological: Positive for  headaches.  Psychiatric/Behavioral:       Anxiety  All other systems reviewed and are negative.       Past Medical History:  Diagnosis Date  . Allergy    . Anxiety    . Arthritis    . High triglycerides    . Kidney stones    . Migraines    . Recurrent sinus infections           Past Surgical History:  Procedure Laterality Date  . ANTERIOR CRUCIATE LIGAMENT REPAIR Left    . BREAST SURGERY        breast reduction  . BUNIONECTOMY Right    . LASIK      . LITHOTRIPSY      . MENISCUS REPAIR Left    . NASAL SEPTUM SURGERY      . REDUCTION MAMMAPLASTY        bilateral  . SHOULDER SURGERY      . VARICOSE VEIN SURGERY Left           Family History  Problem Relation Age of Onset  . Alzheimer's disease Mother    . Hypertension Mother    . Diabetes Mother    . Thyroid disease Mother    . Deep vein thrombosis Maternal Grandmother    . Colon cancer Neg Hx    . Colon polyps Neg Hx    . Esophageal cancer Neg Hx    . Pancreatic cancer Neg Hx    . Rectal cancer Neg Hx    . Stomach cancer Neg Hx    . Breast cancer Neg Hx      Social History:  reports that she has quit smoking. She has never used smokeless tobacco. She reports current alcohol use. She reports that she does not use drugs. Allergies:  Allergies  Allergen Reactions  . Amoxicillin Nausea Only  . Shellfish Allergy Diarrhea and Nausea And Vomiting      [place]              Facility-Administered Medications Prior to Admission  Medication Dose Route Frequency Provider Last Rate Last Admin  . 0.9 %  sodium chloride infusion  500 mL Intravenous Continuous Nandigam, Venia Minks, MD              Medications Prior to Admission  Medication Sig Dispense Refill  . acetaminophen (TYLENOL) 500 MG tablet Take 1 tablet (500 mg total) by mouth every 6 (six) hours as needed. (Patient taking differently: Take 500 mg by mouth every 6 (six) hours as needed for mild pain. ) 30 tablet 0  . B Complex-C (B-COMPLEX WITH VITAMIN C) tablet  Take 1 tablet by mouth daily.      Marland Kitchen BIOTIN PO Take by mouth.      . cetirizine (ZYRTEC) 10 MG tablet Take 10 mg by mouth daily.      . cholecalciferol (VITAMIN D) 1000 units tablet Take 1,000 Units by mouth daily. 2,000 units daily      . Cyanocobalamin (VITAMIN B-12 PO) Take by mouth.      . docusate sodium (COLACE) 100 MG capsule Take 100 mg by mouth daily.      . Flaxseed, Linseed, (FLAX SEEDS PO) Take 1 tablet by mouth daily.      . fluticasone (FLONASE) 50 MCG/ACT nasal spray Place 1 spray into both nostrils 2 (two) times daily.      . Ginkgo Biloba (GINKGO PO) Take by mouth.      . hydrOXYzine (ATARAX/VISTARIL) 25 MG tablet Take 1 tablet (25 mg total) by mouth 3 (three) times daily as needed. (Patient not taking: Reported on 01/29/2018) 90 tablet 1  . ibuprofen (ADVIL,MOTRIN) 600 MG tablet Take 1 tablet (600 mg total) by mouth every 6 (six) hours as needed. 30 tablet 0  . ipratropium (ATROVENT) 0.03 % nasal spray Place into the nose.      . magnesium oxide (MAG-OX) 400 (241.3 Mg) MG tablet Take 400 mg by mouth daily.      . medroxyPROGESTERone (DEPO-PROVERA) 150 MG/ML injection Inject 150 mg into the muscle every 3 (three) months.       . methocarbamol (ROBAXIN) 500 MG tablet Take 1 tablet (500 mg total) by mouth every 8 (eight) hours as needed for muscle spasms. 20 tablet 0  . naproxen (NAPROSYN) 500 MG tablet Take 1 tablet (500 mg total) by mouth every 12 (twelve) hours as needed for mild pain or moderate pain. 30 tablet 0  . NON FORMULARY caltrate bone plus minerals 600plus d3      . Omega-3 Fatty Acids (FISH OIL) 1200 MG CPDR Take by mouth.      . Red Yeast Rice Extract (RED YEAST RICE PO) Take by mouth.      Marland Kitchen UNABLE TO FIND verisol collagen powder      . UNABLE TO FIND cbd oil      . VITAMIN E PO Take by mouth.      . zinc sulfate (ZINC-220) 220 (50 Zn) MG capsule Take 220 mg by mouth daily.          Home: Home Living Family/patient expects to be discharged to:: Private  residence Living Arrangements: Spouse/significant other Available Help at Discharge: Family, Friend(s), Available PRN/intermittently Additional Comments: Unable to get details of home due to aphasia  Lives With: Spouse  Functional History: Prior Function Level of Independence: Independent Comments: works Functional Status:  Mobility: Bed Mobility Overal bed mobility: Needs Assistance Bed Mobility: Supine to Sit Supine to sit: +2 for physical assistance, Max assist, Total assist General bed mobility comments: Assist to bring legs off of bed, elevate trunk into sitting, and bring hips to EOB. Pt trying to assist with LUE.  Transfers Overall transfer level: Needs assistance Transfer via Lift Equipment: [place] transfer comment: Pt with heavy pushing with LUE and and unable to safely attempt transfer without lift due to pushing and multiple lines/tubes   ADL:   Cognition: Cognition Overall Cognitive Status: Difficult to assess Arousal/Alertness: Awake/alert Orientation Level: Other (comment)(global aphasia) Attention: Sustained Sustained Attention: Impaired Sustained Attention Impairment: Verbal basic, Functional basic Awareness: Impaired Behaviors: Restless Cognition Arousal/Alertness: Awake/alert Behavior During Therapy: Restless Overall Cognitive Status: Difficult to assess General Comments: performing some actions to visual cues like lifting foot to have sock put on but not following direct verbal commands Difficult to assess due to: Impaired communication   Blood pressure (!) 151/98, pulse 67, temperature 98.5 F (36.9 C), temperature source Oral, resp. rate 17, weight 57.8 kg, SpO2 (!) 84 %. Physical Exam  Neurological:  Exam very limited as patient having difficulty participating.  Appears to be globally aphasic with neglect.  Overall exam was limited.    General: No acute distress Mood and affect are appropriate Heart: Regular rate and rhythm no rubs  murmurs or extra sounds Lungs: Clear to auscultation, breathing unlabored, no rales or wheezes Abdomen: Positive bowel sounds, soft nontender to palpation, nondistended Extremities: No clubbing, cyanosis, or edema Skin: No evidence of breakdown, no evidence of rash Neurologic: Cannot perform cranial nerve testing secondary to mental status motor strength is 5/5 in left  deltoid, bicep, tricep, grip, hip flexor, knee extensors, ankle dorsiflexor and plantar flexor 0/5 in the right deltoid, bicep, tricep, grip, hip flexion, knee extensor, ankle dorsiflexor and plantar flexor Needs gestural cues for manual muscle testing instructions. Sensory exam difficult to assess secondary to mental status, she does wince and withdraw to pinch in the left upper and left lower limb but not on the right side   Musculoskeletal: No pain with passive and active range of motion in upper and lower limbs no joint swelling   Lab Results Last 24 Hours  Results for orders placed or performed during the hospital encounter of 07/09/19 (from the past 24 hour(s))  Sodium     Status: Abnormal    Collection Time: 07/12/19  6:15 PM  Result Value Ref Range    Sodium 159 (H) 135 - 145 mmol/L  Comprehensive metabolic panel     Status: Abnormal    Collection Time: 07/13/19 12:15 AM  Result Value Ref Range    Sodium 161 (HH) 135 - 145 mmol/L    Potassium 3.1 (L) 3.5 - 5.1 mmol/L    Chloride 129 (H) 98 - 111 mmol/L    CO2 23 22 - 32 mmol/L    Glucose, Bld 135 (H) 70 - 99 mg/dL    BUN 22 (H) 6 - 20 mg/dL    Creatinine, Ser 0.64 0.44 - 1.00 mg/dL    Calcium 10.1 8.9 - 10.3 mg/dL    Total Protein 6.7 6.5 - 8.1 g/dL    Albumin 3.5 3.5 - 5.0 g/dL  AST 12 (L) 15 - 41 U/L    ALT 15 0 - 44 U/L    Alkaline Phosphatase 55 38 - 126 U/L    Total Bilirubin 1.3 (H) 0.3 - 1.2 mg/dL    GFR calc non Af Amer >60 >60 mL/min    GFR calc Af Amer >60 >60 mL/min    Anion gap 9 5 - 15  Magnesium     Status: None    Collection Time:  07/13/19 12:15 AM  Result Value Ref Range    Magnesium 2.3 1.7 - 2.4 mg/dL  CBC     Status: Abnormal    Collection Time: 07/13/19  5:27 AM  Result Value Ref Range    WBC 8.7 4.0 - 10.5 K/uL    RBC 5.16 (H) 3.87 - 5.11 MIL/uL    Hemoglobin 15.6 (H) 12.0 - 15.0 g/dL    HCT 48.2 (H) 36.0 - 46.0 %    MCV 93.4 80.0 - 100.0 fL    MCH 30.2 26.0 - 34.0 pg    MCHC 32.4 30.0 - 36.0 g/dL    RDW 14.4 11.5 - 15.5 %    Platelets 185 150 - 400 K/uL    nRBC 0.0 0.0 - 0.2 %  Basic metabolic panel     Status: Abnormal    Collection Time: 07/13/19  5:55 AM  Result Value Ref Range    Sodium 161 (HH) 135 - 145 mmol/L    Potassium 3.4 (L) 3.5 - 5.1 mmol/L    Chloride 129 (H) 98 - 111 mmol/L    CO2 23 22 - 32 mmol/L    Glucose, Bld 132 (H) 70 - 99 mg/dL    BUN 21 (H) 6 - 20 mg/dL    Creatinine, Ser 0.68 0.44 - 1.00 mg/dL    Calcium 10.1 8.9 - 10.3 mg/dL    GFR calc non Af Amer >60 >60 mL/min    GFR calc Af Amer >60 >60 mL/min    Anion gap 9 5 - 15  Magnesium     Status: None    Collection Time: 07/13/19  5:55 AM  Result Value Ref Range    Magnesium 2.3 1.7 - 2.4 mg/dL  Glucose, capillary     Status: Abnormal    Collection Time: 07/13/19  7:40 AM  Result Value Ref Range    Glucose-Capillary 109 (H) 70 - 99 mg/dL       Imaging Results (Last 48 hours)  CT HEAD WO CONTRAST   Result Date: 07/13/2019 CLINICAL DATA:  Follow-up intracranial hemorrhage. EXAM: CT HEAD WITHOUT CONTRAST TECHNIQUE: Contiguous axial images were obtained from the base of the skull through the vertex without intravenous contrast. COMPARISON:  Four days ago FINDINGS: Brain: Given differences in slice selection unchanged size and shape of the left cerebral hemorrhage centered at the basal ganglia and deep white matter, up to 5 x 4 cm on axial slices. There is expected increase in surrounding edema when compared to CT, stable from brain MRI 2 days ago. Midline shift measures 6-7 mm, stable from brain MRI. No evidence of interval  hemorrhage. No hydrocephalus. Subtle mass along the left petrous ridge better seen on brain MRI. Vascular: Negative Skull: Negative Sinuses/Orbits: Negative IMPRESSION: 1. Compared to brain MRI 2 days ago unchanged left cerebral hemorrhage with vasogenic edema and 6 mm of midline shift. 2. 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 Electronically Signed   By: Monte Fantasia M.D.   On: 07/13/2019 06:22  [place] EKG SITE RITE   Result Date: 07/11/2019 If Site Rite image not attached, placement could not be confirmed due to current cardiac rhythm.        Assessment/Plan: Diagnosis: Left basal ganglia hypertensive hemorrhage 1. Does the need for close, 24 hr/day medical supervision in concert with the patient's rehab needs make it unreasonable for this patient to be served in a less intensive setting? Yes 2. Co-Morbidities requiring supervision/potential complications: Uncontrolled hypertension right hemiplegia right hemisensory deficits, global aphasia, dysphagia, n.p.o. 3. Due to bladder management, bowel management, safety, skin/wound care, disease management, medication administration, pain management and patient education, does the patient require 24 hr/day rehab nursing? Yes 4. Does the patient require coordinated care of a physician, rehab nurse, therapy disciplines of PT, OT, speech therapy to address physical and functional deficits in the context of the above medical diagnosis(es)? Yes Addressing deficits in the following areas: balance, endurance, locomotion, strength, transferring, bowel/bladder control, bathing, dressing, feeding, grooming, toileting, cognition, speech, language, swallowing and psychosocial support 5. Can the patient actively participate in an intensive therapy program of at least 3 hrs of therapy per day at least 5 days per week? Potentially 6. The potential for patient to make measurable gains while on inpatient rehab is fair 7. Anticipated functional outcomes  upon discharge from inpatient rehab are min assist and mod assist  with PT, min assist and mod assist with OT, min assist and mod assist with SLP. 8. Estimated rehab length of stay to reach the above functional goals is: 21 to 25 days 9. Anticipated discharge destination: Home versus SNF 10. Overall Rehab/Functional Prognosis: good   RECOMMENDATIONS: This patient's condition is appropriate for continued rehabilitative care in the following setting: CIR once blood pressures controlled off IV medications Patient has agreed to participate in recommended program. Potentially Note that insurance prior authorization may be required for reimbursement for recommended care.   Comment: Discussed with husband severe hemiplegia, global aphasia, severe swallowing impairment.  Possible need for PEG placement     Cathlyn Parsons, PA-C 07/13/2019    "I have personally performed a face to face diagnostic evaluation of this patient.  Additionally, I have reviewed and concur with the physician assistant's documentation above." Charlett Blake M.D. [facility] FAAPM&R (Sports Med, Neuromuscular Med) Diplomate Am Board of Electrodiagnostic Med          Revision History                Routing History

## 2019-07-19 NOTE — Progress Notes (Signed)
[place] PHYSICAL MEDICINE & REHABILITATION PROGRESS NOTE  Subjective/Complaints: Patient seen laying in bed this AM.  No reported issues overnight.  She attempts to vocalize. Discussed PICC line and restraints with nursing, will DC both.  ROS: Unable to assess due to language  Objective: Vital Signs: Blood pressure 127/77, pulse 94, temperature 97.7 F (36.5 C), temperature source Axillary, resp. rate 16, height 5\' 7"  (1.702 m), weight 57.7 kg, SpO2 99 %. No results found. Recent Labs    07/18/19 0316 07/18/19 1504  WBC 8.3 11.5*  HGB 14.0 14.9  HCT 43.5 44.9  PLT 175 205   Recent Labs    07/18/19 0316 07/18/19 1504  NA 151*  --   K 4.0  --   CL 113*  --   CO2 29  --   GLUCOSE 159*  --   BUN 26*  --   CREATININE 0.70 0.89  CALCIUM 9.8  --     Physical Exam: BP 127/77   Pulse 94   Temp 97.7 F (36.5 C) (Axillary)   Resp 16   Ht 5\' 7"  (1.702 m)   Wt 57.7 kg   SpO2 99%   BMI 19.92 kg/m  Constitutional: No distress . Vital signs reviewed. HENT: Normocephalic.  Atraumatic. Eyes: EOMI. No discharge. Cardiovascular: No JVD. Respiratory: Normal effort.  No stridor. GI: Non-distended. Skin: Warm and dry.  Intact. Psych: Normal mood.  Normal behavior. Musc: No edema in extremities.  No tenderness in extremities. Neuro: Alert Nonverbal Global aphasia Exam limited due to lack of ability to follow commands, however seen spontaneously moving LUE and LLE appears to sense pain on right side, unchanged No movement noted on RUE/RLE, unchanged Skin: Skin is warm and dry.  Psychiatric:  Unable to assess due to mentation   Assessment/Plan: 1. Functional deficits secondary to left basal ganglia hypertensive hemorrhage which require 3+ hours per day of interdisciplinary therapy in a comprehensive inpatient rehab setting.  Physiatrist is providing close team supervision and 24 hour management of active medical problems listed below.  Physiatrist and rehab team  continue to assess barriers to discharge/monitor patient progress toward functional and medical goals  Care Tool:  Bathing    Body parts bathed by patient: Face   Body parts bathed by helper: Right arm, Right lower leg, Left arm, Left lower leg, Chest, Abdomen, Front perineal area, Buttocks, Right upper leg, Left upper leg     Bathing assist Assist Level: 2 Helpers     Upper Body Dressing/Undressing Upper body dressing   What is the patient wearing?: Pull over shirt    Upper body assist Assist Level: Maximal Assistance - Patient 25 - 49%    Lower Body Dressing/Undressing Lower body dressing      What is the patient wearing?: Incontinence brief, Pants     Lower body assist Assist for lower body dressing: 2 Helpers     Toileting Toileting    Toileting assist Assist for toileting: 2 Helpers     Transfers Chair/bed transfer  Transfers assist  Chair/bed transfer activity did not occur: Safety/medical concerns  Chair/bed transfer assist level: 2 Helpers(slideboard)     Locomotion Ambulation   Ambulation assist   Ambulation activity did not occur: Safety/medical concerns          Walk 10 feet activity   Assist  Walk 10 feet activity did not occur: Safety/medical concerns        Walk 50 feet activity   Assist Walk 50 feet with 2 turns  activity did not occur: Safety/medical concerns         Walk 150 feet activity   Assist Walk 150 feet activity did not occur: Safety/medical concerns         Walk 10 feet on uneven surface  activity   Assist Walk 10 feet on uneven surfaces activity did not occur: Safety/medical concerns         Wheelchair     Assist Will patient use wheelchair at discharge?: Yes Type of Wheelchair: Manual    Wheelchair assist level: Dependent - Patient 0%(TIS w/c.) Max wheelchair distance: 150 ft    Wheelchair 50 feet with 2 turns activity    Assist        Assist Level: Dependent - Patient 0%    Wheelchair 150 feet activity     Assist     Assist Level: Dependent - Patient 0%      Medical Problem List and Plan: 1.  Right side weakness and aphasia secondary to left basal ganglia hypertensive hemorrhage  Begin CIR evaluations 2.  Antithrombotics: -DVT/anticoagulation: Subcutaneous heparin initiated 07/14/2019.  Monitor for any bleeding episodes             -antiplatelet therapy: N/A 3. Pain Management: Tylenol as needed 4. Mood: Provide emotional support             -antipsychotic agents: N/A 5. Neuropsych: This patient is not capable of making decisions on her own behalf. 6. Skin/Wound Care: Routine skin checks 7. Fluids/Electrolytes/Nutrition: Routine in and outs.  CMP ordered for tomorrow.  8.  Hypertension.  Lisinopril 20 mg twice daily, Norvasc 10 mg daily.               Monitor with increased mobility 9.  Hypernatremia-likely due to hypertonic saline             Na 151 on 12/19, labs ordered for tomorrow 10.  Leukocytosis             WBCs 11.5 on 12/19, labs ordered for tomorrow             Afebrile  LOS: 1 days A FACE TO FACE EVALUATION WAS PERFORMED  Ankit Lorie Phenix 07/19/2019, 1:08 PM

## 2019-07-19 NOTE — Evaluation (Signed)
Occupational Therapy Assessment and Plan  Patient Details  Name: Melanie Madden MRN: [id number] Date of Birth: Feb 14, 1966  OT Diagnosis: ataxia, cognitive deficits, disturbance of vision, flaccid hemiplegia and hemiparesis, hemiplegia affecting dominant side and muscle weakness (generalized) Rehab Potential: Rehab Potential (ACUTE ONLY): Fair ELOS: 3.5-4 weeks   Today's Date: 07/19/2019 OT Individual Time: 9381-0175 OT Individual Time Calculation (min): 55 min     Problem List:  Patient Active Problem List   Diagnosis Date Noted  . 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 Hypernatremia   . Leukocytosis   . Essential hypertension   . Global aphasia   . Cytotoxic brain edema ([place]) 07/10/2019  . ICH (intracerebral hemorrhage) ([place]) 07/09/2019  . Anxiety state 02/21/2015  . Cephalalgia 12/21/2014    Past Medical History:  Past Medical History:  Diagnosis Date  . Allergy   . Anxiety   . Arthritis   . High triglycerides   . Kidney stones   . Migraines   . Recurrent sinus infections    Past Surgical History:  Past Surgical History:  Procedure Laterality Date  . ANTERIOR CRUCIATE LIGAMENT REPAIR Left   . BUNIONECTOMY Right   . LASIK    . LITHOTRIPSY    . MENISCUS REPAIR Left   . NASAL SEPTUM SURGERY    . SHOULDER SURGERY    . VARICOSE VEIN SURGERY Left     Assessment & Plan Clinical Impression: Melanie Madden is a 53 year old right-handed female with history of hypertension, anxiety, migraine headaches and tobacco abuse.  Per chart review and husband, due to cognition, patient lives with spouse and was independent prior to admission.  She presented on 07/09/2019 with right-sided weakness and aphasia after returning home from work.  Cranial CT scan showed intraparenchymal hematoma with a volume of 44 cc centered in the left basal ganglia most consistent with hypertensive hemorrhage.  5 mm rightward midline shift.  MRA of the head negative.  Echocardiogram with  ejection fraction of 65%.  Patient did not receive TPA due to [place].  Admission labs SARS coronavirus negative.  Patient initially on 3% saline.  Cleviprex for blood pressure control later discontinued.  Her diet was advanced to dysphagia #2 nectar thick liquid.  She was cleared to begin subcutaneous heparin for DVT prophylaxis 07/14/2019.  Therapy evaluations completed and patient was admitted for a comprehensive rehab program.  Patient transferred to CIR on 07/18/2019 .    Patient currently requires total with basic self-care skills secondary to muscle paralysis, decreased cardiorespiratoy endurance, abnormal tone, unbalanced muscle activation, ataxia, decreased coordination and decreased motor planning, decreased visual acuity, decreased visual perceptual skills and decreased visual motor skills, decreased midline orientation, decreased attention to right, decreased motor planning and ideational apraxia, decreased initiation, decreased attention, decreased awareness, decreased problem solving, decreased safety awareness, decreased memory and delayed processing and decreased sitting balance, decreased standing balance, decreased postural control, hemiplegia and decreased balance strategies.  Prior to hospitalization, patient could complete ADLs/IADLs with independent .  Patient will benefit from skilled intervention to decrease level of assist with basic self-care skills prior to discharge home with care partner.  Anticipate patient will require minimal physical assistance and follow up home health.  OT - End of Session Activity Tolerance: Tolerates 10 - 20 min activity with multiple rests Endurance Deficit: Yes OT Assessment Rehab Potential (ACUTE ONLY): Fair OT Patient demonstrates impairments in the following area(s): Balance;Safety;Behavior;Sensory;Cognition;Skin Integrity;Vision;Endurance;Motor;Pain;Nutrition;Perception OT Basic ADL's Functional Problem(s):  Eating;Grooming;Bathing;Dressing;Toileting OT Transfers Functional Problem(s): Toilet OT Additional Impairment(s):  Fuctional Use of Upper Extremity OT Plan OT Intensity: Minimum of 1-2 x/day, 45 to 90 minutes OT Frequency: 5 out of 7 days OT Duration/Estimated Length of Stay: 3.5-4 weeks OT Treatment/Interventions: Balance/vestibular training;Discharge planning;Functional electrical stimulation;Pain management;Self Care/advanced ADL retraining;Therapeutic Activities;UE/LE Coordination activities;Visual/perceptual remediation/compensation;Therapeutic Exercise;Skin care/wound managment;Patient/family education;Functional mobility training;Disease mangement/prevention;Cognitive remediation/compensation;DME/adaptive equipment instruction;Neuromuscular re-education;Psychosocial support;Splinting/orthotics;UE/LE Strength taining/ROM;Wheelchair propulsion/positioning;Community reintegration OT Self Feeding Anticipated Outcome(s): Supervision/set-up OT Basic Self-Care Anticipated Outcome(s): Min A OT Toileting Anticipated Outcome(s): Min A OT Bathroom Transfers Anticipated Outcome(s): Min A OT Recommendation Patient destination: Home Follow Up Recommendations: Home health OT;24 hour supervision/assistance Equipment Recommended: To be determined   Skilled Therapeutic Intervention Pt seen for OT eval and ADL bathing/dressing session. Pt awake in supine upon arrival, nodded "no" when asked if she had pain.  Pt noted to have been incontinent of urine. +2 total A for hygiene and new brief to be donned.  Completed bathing at bed level. Hand over hand to initiate bathing with L UE and pt able to cont with task, however, multi-modal cuing to attend to all areas and several episodes of pt attemtping to put wet washcloth into mouth. Overall max A UB bathing. Same cuing required for washing LEs, pt does not attend to R LE during bathing task and completed with total A. Pt initiated threading R LE into pants, total  A to thread L LE and pt bridging to assist with advancing pants over hips from bed level. Once sock initially threaded on L foot, pt able to pull up remainder of way, total A to don R sock. +2 total A for supine>sitting EOB. Pt demonstrates pushing tendencies and R side inattention. Max A static sitting balance EOB with pushing tendencies. +2 total A squat pivot transfer to standard w/c. Placed R half lap tray. Pt restless in chair and deemed unsafe to be left alone sitting up in w/c Therefore, transferred back to bed in same manner as described above. Pt left in supine with all needs in reach, bed alarm on.  RN made aware of pt's mobility status and recommend toileting from bed level at this time.   OT Evaluation Precautions/Restrictions  Precautions Precautions: Fall;Other (comment) Precaution Comments: pusher, right hemiparesis Restrictions Weight Bearing Restrictions: No General Chart Reviewed: Yes Home Living/Prior Functioning Home Living Family/patient expects to be discharged to:: Private residence Living Arrangements: Spouse/significant other Available Help at Discharge: Family, Available 24 hours/day Type of Home: House Home Access: Stairs to enter [employer] of Steps: 2 to 3 Entrance Stairs-Rails: None Home Layout: Able to live on main level with bedroom/bathroom, Full bath on main level Bathroom Shower/Tub: Tub/shower unit, Multimedia programmer: Standard Bathroom Accessibility: Yes Additional Comments: information taken per chart review- info recieved from spouse  Lives With: Spouse Prior Function Level of Independence: Independent with basic ADLs, Independent with gait  Able to Take Stairs?: Yes Vocation: Other (Comment) Comments: H&P reports pt coming home from work during onset of symptoms; no family present to report PLOF Vision Baseline Vision/History: (Unable to assess 2/2 cognitive deficits, no family to report PLOF) Vision Assessment?:  Vision impaired- to be further tested in functional context Additional Comments: Unable to formally assess 2/2 cognitive and communication deficits Perception  Perception: Impaired Inattention/Neglect: Does not attend to right visual field;Does not attend to right side of body Praxis Praxis: Impaired Praxis Impairment Details: Motor planning;Perseveration;Ideation Cognition Overall Cognitive Status: Impaired/Different from baseline Arousal/Alertness: Awake/alert Orientation Level: Nonverbal/unable to assess Memory: (Unable to assess 2/2 non-verbal) Attention: Focused Focused Attention: Impaired  Focused Attention Impairment: Verbal basic;Functional basic Sustained Attention: Impaired Sustained Attention Impairment: Verbal basic;Functional basic Awareness: Impaired Awareness Impairment: Intellectual impairment Problem Solving: Impaired Problem Solving Impairment: Verbal basic;Functional basic Executive Function: (All impaired 2/2 lower level deficits) Behaviors: Restless;Impulsive;Poor frustration tolerance Safety/Judgment: Impaired Comments: impulsive with reaching/grabbing for things Sensation Sensation Additional Comments: unable to formally assess sensation secondary to cognitive deficits, inconsistently turns head towards tactile stimuli on her R side Coordination Gross Motor Movements are Fluid and Coordinated: No Fine Motor Movements are Fluid and Coordinated: No Coordination and Movement Description: R hemiplegia Finger Nose Finger Test: Unable to assess 2/2 cognitive deficits; Flaccid R UE Motor  Motor Motor: Hemiplegia;Abnormal postural alignment and control;Abnormal tone;Within Functional Limits Motor - Skilled Clinical Observations: R hemiparesis, increased tone in R UE Mobility  Bed Mobility Bed Mobility: Rolling Right;Supine to Sit;Sit to Supine Rolling Right: Moderate Assistance - Patient 50-74% Supine to Sit: 2 Helpers Sit to Supine: 2 Helpers Transfers Sit  to Stand: 2 Helpers  Trunk/Postural Assessment  Cervical Assessment Cervical Assessment: Exceptions to WFL(forward head) Thoracic Assessment Thoracic Assessment: Exceptions to WFL(rounded shoulders) Lumbar Assessment Lumbar Assessment: Exceptions to WFL(posterior pelvic tilt in sitting) Postural Control Postural Control: Deficits on evaluation Trunk Control: impaired Righting Reactions: impaired  Balance Balance Balance Assessed: Yes Static Sitting Balance Static Sitting - Balance Support: Feet supported;No upper extremity supported Static Sitting - Level of Assistance: 1: +1 Total assist Static Sitting - Comment/# of Minutes: Sitting EOB; pushing tendencies with L UE Dynamic Sitting Balance Dynamic Sitting - Balance Support: Feet supported Dynamic Sitting - Level of Assistance: 1: +1 Total assist Sitting balance - Comments: Sitting EOB Extremity/Trunk Assessment RUE Assessment RUE Assessment: Exceptions to [facility] Passive Range of Motion (PROM) Comments: Pt used L UE to raise R UE overhead at bed level demonstrating full shoulder ROM Active Range of Motion (AROM) Comments: Appears 0/5 throughout, however, unable to formally assess 2/2 pt's cognition and grabbing/swatting of therapist with attempts to assess RUE Body System: Neuro Brunstrum levels for arm and hand: Arm Brunstrum level for arm: Stage I Presynergy RUE AROM (degrees) Overall AROM Right Upper Extremity: Deficits LUE Assessment LUE Assessment: Within Functional Limits     Refer to Care Plan for Long Term Goals  Recommendations for other services: None    Discharge Criteria: Patient will be discharged from OT if patient refuses treatment 3 consecutive times without medical reason, if treatment goals not met, if there is a change in medical status, if patient makes no progress towards goals or if patient is discharged from hospital.  The above assessment, treatment plan, treatment alternatives and goals were  discussed and mutually agreed upon: No family available/patient unable  Jayonna Meyering L 07/19/2019, 12:12 PM

## 2019-07-19 NOTE — Progress Notes (Signed)
Melanie Gong, RN  Rehab Admission Coordinator  Physical Medicine and Rehabilitation  PMR Pre-admission  Signed  Date of Service:  07/17/2019  5:19 PM      Related encounter: ED to Hosp-Admission (Discharged) from 07/09/2019 in [facility]      Signed        Show:Clear all [x] Manual[x] Template[x] Copied  Added by: [x] Melanie Gong, RN  [] Hover for details PMR Admission Coordinator Pre-Admission Assessment   Patient: Melanie Madden is an 53 y.o., female MRN: [id number] DOB: 01/02/1966 Height: 5\' 5"  (165.1 cm) Weight: 61.4 kg                                                                                                                                                  Insurance Information HMO:     PPO: yes     PCP: \     IPA: \     80/20:      OTHER:  PRIMARY: [employer]      Policy#: [id number]      Subscriber: spouse CM Name: Charlaine Dalton      Phone#: [phone] ext [phone]     Fax#: Epic access Pre-Cert#: [id number] approved for 7 days     Employer: [employer] Benefits:  Phone #: [phone]     Name: 12/16 Eff. Date: 07/30/2018     Deduct: $500      Out of Pocket Max: $4000      Life Max: none CIR: 100% coverage after deductible      SNF: 100% coverage 60 days per benefit period Outpatient: $50 per visit     Co-Pay: 60 visits combined Home Health: 100%      Co-Pay: visits per medical neccesity DME: 100%     Co-Pay: none Providers: in network  SECONDARY: none         Medicaid Application Date:       Case Manager:  Disability Application Date:       Case Worker:    The "Data Collection Information Summary" for patients in Inpatient Rehabilitation Facilities with attached "Privacy Act [place] Records" was provided and verbally reviewed with: N/A   Emergency Contact Information         Contact Information     Name Relation Home Work Mobile    Rock,[name] Spouse [phone]   [phone]       Current Medical History  Patient  Admitting Diagnosis: ICH   History of Present Illness:  53 year old right-handed female with history of hypertension, anxiety, migraine headaches and tobacco abuse.  Presented 07/09/2019 with right-sided weakness and aphasia of acute onset after returning home from work.  Cranial CT scan showed intraparenchymal hematoma with a volume of 44 cc centered in the left basal ganglia most consistent with hypertensive hemorrhage.  5 mm rightward midline shift.  MRA of the head  negative.  Echocardiogram with ejection fraction of 65%.  Patient did not receive TPA due to [place].  Admission labs SARS coronavirus negative.  Patient initially on 3% saline.  Cleviprex for blood pressure control later discontinued.  Her diet was advanced to dysphagia #2 nectar thick liquid.  She was cleared to begin subcutaneous heparin for DVT prophylaxis 07/14/2019.     Complete NIHSS TOTAL: 17 Glasgow Coma Scale Score: 10   Past Medical History      Past Medical History:  Diagnosis Date  . Allergy    . Anxiety    . Arthritis    . High triglycerides    . Kidney stones    . Migraines    . Recurrent sinus infections        Family History  family history includes Alzheimer's disease in her mother; Deep vein thrombosis in her maternal grandmother; Diabetes in her mother; Hypertension in her mother; Thyroid disease in her mother.   Prior Rehab/Hospitalizations:  Has the patient had prior rehab or hospitalizations prior to admission? Yes   Has the patient had major surgery during 100 days prior to admission? No   Current Medications    Current Facility-Administered Medications:  .  acetaminophen (TYLENOL) tablet 650 mg, 650 mg, Oral, Q4H PRN **OR** acetaminophen (TYLENOL) 160 MG/5ML solution 650 mg, 650 mg, Per Tube, Q4H PRN **OR** acetaminophen (TYLENOL) suppository 650 mg, 650 mg, Rectal, Q4H PRN, Greta Doom, MD .  amLODipine (NORVASC) tablet 10 mg, 10 mg, Oral, Daily, Rosalin Hawking, MD, 10 mg at 07/17/19  1007 .  Chlorhexidine Gluconate Cloth 2 % PADS 6 each, 6 each, Topical, Daily, Garvin Fila, MD, 6 each at 07/17/19 1020 .  feeding supplement (ENSURE ENLIVE) (ENSURE ENLIVE) liquid 237 mL, 237 mL, Oral, BID BM, Rosalin Hawking, MD, 237 mL at 07/17/19 1336 .  heparin injection 5,000 Units, 5,000 Units, Subcutaneous, Q8H, Rosalin Hawking, MD, 5,000 Units at 07/17/19 1338 .  insulin aspart (novoLOG) injection 0-6 Units, 0-6 Units, Subcutaneous, TID WC, Rosalin Hawking, MD, 1 Units at 07/17/19 1335 .  labetalol (NORMODYNE) injection 10-20 mg, 10-20 mg, Intravenous, Q2H PRN, Rosalin Hawking, MD .  lisinopril (ZESTRIL) tablet 20 mg, 20 mg, Oral, BID, Rosalin Hawking, MD, 20 mg at 07/17/19 1007 .  Resource ThickenUp Clear, , Oral, PRN, Rosalin Hawking, MD .  senna-docusate (Senokot-S) tablet 1 tablet, 1 tablet, Oral, BID, Rosalin Hawking, MD, 1 tablet at 07/17/19 1007 .  sodium chloride flush (NS) 0.9 % injection 10-40 mL, 10-40 mL, Intracatheter, Q12H, Rosalin Hawking, MD, 10 mL at 07/17/19 1008 .  sodium chloride flush (NS) 0.9 % injection 10-40 mL, 10-40 mL, Intracatheter, PRN, Rosalin Hawking, MD   Patients Current Diet:     Diet Order                      DIET DYS 2 Room service appropriate? 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  Independent   Indoor Mobility: Did the patient need assistance with walking from room to room (with or without  device)? Independent   Stairs: Did the patient need assistance with internal or external stairs (with or without device)? Independent   Functional Cognition: Did the patient need help planning regular tasks such as shopping or remembering to take medications? Independent   Home Assistive Devices / Equipment   Prior Device Use: Indicate devices/aids used by the patient prior to current illness, exacerbation or injury? None of the above   Current Functional Level Cognition   Arousal/Alertness: Awake/alert Overall Cognitive Status: Impaired/Different from baseline Difficult to assess due to: Impaired communication Orientation Level: Other (comment)(UTA) Following Commands: Follows one step commands inconsistently Safety/Judgement: Decreased awareness of safety, Decreased awareness of deficits General Comments: PTA started both socks and patient able to complete donning with flexing forward from waist.  She does push and throw weight to the R.  While PTA was setting up room she pulled on rail to sit up with left hadn then threw weight to the R and hit her head on the bed rail. Attention: Sustained Sustained Attention: Impaired Sustained Attention Impairment: Verbal basic, Functional basic Awareness: Impaired Behaviors: Restless    Extremity Assessment (includes Sensation/Coordination)   Upper Extremity Assessment: RUE deficits/detail RUE Deficits / Details: Flaccid; she did intermittently regard it  Lower Extremity Assessment: RLE deficits/detail RLE Deficits / Details: Strength 0/5. PROM WFL. Pt trying to use LLE to lift RLE     ADLs   Overall ADL's : Needs assistance/impaired Eating/Feeding: NPO Grooming Details (indicate cue type and reason): Washed both sides of face/mouth but not forehead when washcloth touched to her face and then placed in her left hand; when comb place in left hand she did not attempt to comb hair even after combing it for her a little and placing comb back in  her hand Upper Body Bathing: Total assistance, Sitting Lower Body Bathing: Total assistance, Bed level Upper Body Dressing : Maximal assistance, Sitting Lower Body Dressing: Total assistance, Bed level     Mobility   Overal bed mobility: Needs Assistance Bed Mobility: Rolling Rolling: Max assist Supine to sit: Mod assist, Max assist General bed mobility comments: Mod-max +1 to move to edge of bed.  Max VCs for sequencing     Transfers   Overall transfer level: Needs assistance Equipment used: Ambulation equipment used(sara + sit to stand) Transfer via Lift Equipment: Marketing executive Transfers: Sit to/from Stand Sit to Stand: Max assist, +2 physical assistance General transfer comment: Utilized sara + lift to achieve standing.  Pt required cues for hand placement and hand over hand assistance to keep L elbow on platform.  Pt is recepetive to cues for upright trunk control.  Pt required cues for upper trunk control and to keep arms braced on platform.     Ambulation / Gait / Stairs / Wheelchair Mobility   Ambulation/Gait Ambulation/Gait assistance: (NT unable)     Posture / Balance Dynamic Sitting Balance Sitting balance - Comments: Pt with heavy pushing with LUE toward rt. Initially requiring max assist to maintain sitting EOB. Placed bedside table in front of pt to prop forearms but pt just leaned head forward on table. Removed table and sat in front of pt. Had pt prop on lt elbow and then returned pt to midline with improvement in sitting balance requiring only min assist for 30-40 sec before pushing again.  Balance Overall balance assessment: Needs assistance Sitting-balance support: Single extremity supported Sitting balance-Leahy Scale: Zero Sitting balance - Comments: Pt with heavy pushing with LUE toward rt. Initially requiring max assist to maintain sitting EOB. Placed bedside table in front of pt to prop forearms but pt just leaned head forward on table. Removed table and sat in front  of pt. Had pt prop on lt elbow and then returned pt to midline with improvement in sitting balance requiring only min assist for 30-40 sec before pushing again. Postural control: Right lateral lean Standing balance-Leahy Scale: Zero     Special needs/care consideration BiPAP/CPAP CPM Continuous Drip IV Dialysis Life Vest Oxygen Special Bed Trach Size Wound Vac  Skin                              Bowel mgmt:LBM 12/17 incontinent Bladder mgmt: external catheter Diabetic mgmt  Behavioral consideration  Chemo/radiation  Designated visitor is spouse [name]    Previous Home Environment  Living Arrangements: Spouse/significant other  Lives With: Spouse Available Help at Discharge: Family, Available 24 hours/day(spouse and hired assist) Type of Home: [place]: Able to live on main level with bedroom/bathroom, Full bath on main level Home Access: Stairs to enter Entrance Stairs-Rails: None Entrance Stairs-Number of Steps: 2 to 3 Bathroom Shower/Tub: Tub/shower unit, Multimedia programmer: Standard Bathroom Accessibility: Yes How Accessible: Accessible via walker [place]: No Additional Comments: verified with spouse   Discharge Living Setting Plans for Discharge Living Setting: Patient's home, Lives with (comment)(spouse) Type of Home at Discharge: House Discharge Home Layout: Full bath on main level, Able to live on main level with bedroom/bathroom, Two level Discharge Home Access: Stairs to enter Entrance Stairs-Rails: None Entrance Stairs-Number of Steps: 2 Discharge Bathroom Shower/Tub: Tub/shower unit, Horticulturist, commercial: Standard Discharge Bathroom Accessibility: Yes How Accessible: Accessible via walker Does the patient have any problems obtaining your medications?: No   Social/Family/Support Systems Patient Roles: 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) Contact Information: spouse  Systems developer Anticipated Caregiver: spouse and hired Soil scientist Information: see above Ability/Limitations of Caregiver: spouse work Pharmacist, hospital but will take Event organiser and hire Restaurant manager, fast food Availability: 24/7 Discharge Plan Discussed with Primary Caregiver: Yes Is Caregiver In Agreement with Plan?: No Does Caregiver/Family have Issues with Lodging/Transportation while Pt is in Rehab?: No     Goals/Additional Needs Patient/Family Goal for Rehab: min asisst with PT, OT and SLP Expected length of stay: ELOS 21 to 28 days Cultural Considerations: patient speaks fluent 5 languages Pt/Family Agrees to Admission and willing to participate: Yes Program Orientation Provided & Reviewed with Pt/Caregiver Including Roles  & Responsibilities: Yes     Decrease burden of Care through IP rehab admission:   Possible need for SNF placement upon discharge:n/a   Patient Condition: This patient's medical and functional status has changed since the consult dated 07/13/2019 in which the Rehabilitation Physician determined and documented that the patient was potentially appropriate for intensive rehabilitative care in an inpatient rehabilitation facility. Issues have been addressed and update has been discussed with Dr. Posey Pronto and patient now appropriate for inpatient rehabilitation. Will admit to inpatient rehab Saturday 12/19 when bed is available.   Preadmission Screen Completed By:  Cleatrice Burke, RN, 07/17/2019 5:19 PM ______________________________________________________________________   Discussed status with Dr. Posey Pronto on 07/17/2019 at  1730 and received approval for admission Saturday 12/19 when bed  is available..   Admission Coordinator:  Cleatrice Burke, time [id number] Date 07/17/2019         Cosigned by: Jamse Arn, MD at 07/18/2019  9:07 AM  Revision History

## 2019-07-19 NOTE — Evaluation (Signed)
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   Hypernatremia    Leukocytosis    Essential hypertension    Global aphasia    Cytotoxic brain edema ([place]) 07/10/2019   ICH (intracerebral hemorrhage) ([place]) 07/09/2019   Anxiety state 02/21/2015   Cephalalgia 12/21/2014    Past Medical History:  Past Medical History:  Diagnosis Date   Allergy    Anxiety    Arthritis    High triglycerides    Kidney stones    Migraines    Recurrent sinus infections    Past Surgical History:  Past Surgical History:  Procedure Laterality Date   ANTERIOR CRUCIATE LIGAMENT REPAIR Left    BUNIONECTOMY Right    LASIK     LITHOTRIPSY     MENISCUS REPAIR Left    NASAL SEPTUM SURGERY     SHOULDER SURGERY     VARICOSE VEIN SURGERY Left     Assessment & Plan Clinical Impression: Patient is a 53 y.o. year old female with history of hypertension, anxiety, migraine headaches and tobacco abuse.  Per chart review and husband, due to cognition, patient lives with spouse and was independent prior to admission.  She presented on 07/09/2019 with right-sided weakness and aphasia after returning home from work.  Cranial CT scan showed intraparenchymal hematoma with a volume of 44 cc centered in the left basal ganglia most consistent with hypertensive hemorrhage.  5 mm rightward midline shift.  MRA of the head negative.  Echocardiogram with ejection fraction of 65%.  Patient did not receive TPA due to [place].  Admission labs SARS coronavirus  negative.  Patient initially on 3% saline.  Cleviprex for blood pressure control later discontinued.  Her diet was advanced to dysphagia #2 nectar thick liquid.  She was cleared to begin subcutaneous heparin for DVT prophylaxis 07/14/2019.  Therapy evaluations completed and patient was admitted for a comprehensive rehab program.  Please see preadmission assessment from earlier today as well..  Patient transferred to CIR on 07/18/2019 .   Patient currently requires total with mobility secondary to muscle weakness, impaired timing and sequencing, abnormal tone, decreased coordination, and decreased motor planning, decreased midline orientation and decreased attention to right, decreased attention, decreased awareness, and decreased safety awareness, and decreased sitting balance, decreased standing balance, decreased postural control, hemiplegia, and decreased balance strategies.  Prior to hospitalization, patient was independent  with mobility and lived with Spouse in a House home.  Home access is 2 to 3Stairs to enter.  Patient will benefit from skilled PT intervention to maximize safe functional mobility, minimize fall risk, and decrease caregiver burden for planned discharge home with 24 hour assist.  Anticipate patient will benefit from follow up [facility] at discharge.  PT - End of Session Activity Tolerance: Tolerates 30+ min activity with multiple rests Endurance Deficit: Yes PT Assessment Rehab Potential (ACUTE/IP ONLY): Good PT Barriers to Discharge: Home environment access/layout PT Barriers to Discharge Comments: 2-3 STE no rails PT Patient demonstrates impairments in the following area(s): Balance;Behavior;Edema;Endurance;Motor;Pain;Safety PT Transfers Functional Problem(s): Bed Mobility;Bed to Chair;Car;Furniture PT Locomotion Functional  Problem(s): Ambulation;Wheelchair Mobility;Stairs PT Plan PT Intensity: Minimum of 1-2 x/day ,45 to 90 minutes PT Frequency: 5 out of 7 days PT Duration  Estimated Length of Stay: 4 weeks PT Treatment/Interventions: Ambulation/gait training;DME/adaptive equipment instruction;UE/LE Strength taining/ROM;Psychosocial support;Balance/vestibular training;Functional electrical stimulation;UE/LE Coordination activities;Cognitive remediation/compensation;Functional mobility training;Splinting/orthotics;Visual/perceptual remediation/compensation;Community reintegration;Wheelchair propulsion/positioning;Stair training;Neuromuscular re-education;Discharge planning;Pain management;Therapeutic Activities;Patient/family education;Disease management/prevention;Therapeutic Exercise PT Transfers Anticipated Outcome(s): min assist PT Locomotion Anticipated Outcome(s): mod assist PT Recommendation Follow Up Recommendations: Home health PT Patient destination: Home Equipment Recommended: To be determined  Skilled Therapeutic Intervention  Session 1: Evaluation completed (see details above and below) with education on PT POC and goals and individual treatment initiated with focus on functional mobility, sitting balance, OOB activity tolerance, R attention and midline orientation. Pt supine in bed upon PT arrival, agreeable to therapy tx and unable to report pain. Therapist retrieved TIS w/c this session in order to allow for better positioning for pt when sitting up OOB, pt with poor postural control, limited sitting balance and R lean limiting her ability to safely sit in a standard chair. Pt transferred to sitting EOB with +2 assist and requiring max assist in sitting for balance to limit pushing and prevent R LOB, therapist able to cue pt to bring her L hand into lap and limit pushing, pt constantly trying to reach out/grab for things. Pt performed total assist +2 slideboard transfer to TIS w/c this session, dependent for w/c propulsion. Pt performed sit<>stand using L rail for UE support and mirror for visual feedback, max assist +2 to perform sit<>stand and once in  standing max assist +2 for standing balance with therapist facilitating increased R hip extension and blocking R knee, mirror for visual feedback and cues to limit pushing/maintain midline, pt able to briefly take L UE off rail and stand without UE support x 5 sec. Pt returned to sitting. Pt performed slideboard transfers this session from TIS<>mat with slideboard and total +2 assist. Seated edge of mat this session pt worked on sitting balance and upright posture with therapist behind pt to facilitation increased trunk extension, second helper present to bring pt's hands back to her lap and limit pushing, up to max assist for sitting balance but able to maintain briefly with min assist. Seated edge of mat pt worked on sitting balance while performing visual scanning to R and working on R attention to reach for bean bags with hand, therapist providing hand over hand assist to initiate reaching and provides tactile cues to turn patients head R. With use of mirror for visual feedback pt able to maintain sitting balance midline with hands in lap. Decided to trial use of stedy this session, performed sit<>stand from mat with stedy and max assist +2, once sitting on stedy seat pt with increased pushing and reaching with L UE, therapist and tech trying to calm pt and reassure that she is safe with use of mirror for feedback, pt continues to push and lean R. Stedy unsafe to use at this time, recommend maximove for nursing to use with transfers. Pt transported to nurses station and left in [place] at nurses station for supervision with chair alarm set.   Session 2: Returned for second session to help patient back to bed, pt already in bed and NT reports that they used the maximove to get her back to bed. Pt's husband present in the room, therapist provided family education and discussed the following- pt's current mobility status, stroke recovery, deficits from the CVA, working on attention to R side  and also discussed  potentially getting a ramp built since they have 3 STE without rails. 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 Pt's husband asking about physical therapy prognosis, therapist provided information regarding this, her previously active lifesyle and young age are both positive prognostic factors. Pt left supine in bed with needs in reach and bed alarm set, husband with no further questions at this time.     PT Evaluation Precautions/Restrictions Precautions Precautions: Fall;Other (comment) Precaution Comments: pusher, right hemiparesis Restrictions Weight Bearing Restrictions: No Pain Pain Assessment Pain Scale: Faces Faces Pain Scale: No hurt Home Living/Prior Functioning Home Living Available Help at Discharge: Family;Available 24 hours/day Type of Home: House Home Access: Stairs to enter [employer] of Steps: 2 to 3 Entrance Stairs-Rails: None Home Layout: Able to live on main level with bedroom/bathroom;Full bath on main level Bathroom Shower/Tub: Tub/shower unit;Walk-in shower Bathroom Toilet: Standard Bathroom Accessibility: Yes Additional Comments: information taken per chart review- info recieved from spouse  Lives With: Spouse Prior Function Level of Independence: Independent with basic ADLs;Independent with gait  Able to Take Stairs?: Yes 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 Overall Cognitive Status: Impaired/Different from baseline Arousal/Alertness: Awake/alert Orientation Level: Other (comment)(unable to assess secondary to aphasia) Attention: Focused Focused Attention: Impaired Focused Attention Impairment: Verbal basic;Functional basic Sustained Attention: Impaired Sustained Attention Impairment: Verbal basic;Functional basic Memory: (Unable to assess 2/2 non-verbal) Awareness: Impaired Awareness Impairment: Intellectual impairment Problem Solving: Impaired Problem Solving Impairment: Verbal  basic;Functional basic Executive Function: (All impaired 2/2 lower level deficits) Behaviors: Restless;Impulsive;Poor frustration tolerance Safety/Judgment: Impaired Comments: impulsive with reaching/grabbing for things Sensation Sensation Additional Comments: unable to formally assess sensation secondary to cognitive deficits, inconsistently turns head towards tactile stimuli on her R side Coordination Gross Motor Movements are Fluid and Coordinated: No Fine Motor Movements are Fluid and Coordinated: No Coordination and Movement Description: R hemiplegia Finger Nose Finger Test: Unable to assess 2/2 cognitive deficits; Flaccid R UE Motor  Motor Motor: Hemiplegia;Abnormal postural alignment and control;Abnormal tone;Within Functional Limits Motor - Skilled Clinical Observations: R hemiparesis, increased tone in R UE  Mobility Bed Mobility Bed Mobility: Rolling Right;Supine to Sit;Sit to Supine Rolling Right: Moderate Assistance - Patient 50-74% Supine to Sit: 2 Helpers Sit to Supine: 2 Helpers Transfers Transfers: Sit to Stand;Lateral/Scoot Transfers Sit to Stand: 2 Helpers Lateral/Scoot Transfers: 2 Helpers(slideboard) Locomotion  Gait Ambulation: No Stairs / Additional Locomotion Stairs: No Architect: Yes Wheelchair Assistance: Dependent - Patient 0% Wheelchair Parts Management: Needs assistance  Trunk/Postural Assessment  Cervical Assessment Cervical Assessment: Exceptions to WFL(forward head) Thoracic Assessment Thoracic Assessment: Exceptions to WFL(rounded shoulders) Lumbar Assessment Lumbar Assessment: Exceptions to WFL(posterior pelvic tilt in sitting) Postural Control Postural Control: Deficits on evaluation Trunk Control: impaired Righting Reactions: impaired  Balance Balance Balance Assessed: Yes Static Sitting Balance Static Sitting - Balance Support: Feet supported;No upper extremity supported Static Sitting - Level of  Assistance: 2: Max assist Static Sitting - Comment/# of Minutes: able to sit EOB and edge of mat with therapist cueing and bringing "hands in lap" to limit pushing with L UE- static sitting up to max assist for balance Dynamic Sitting Balance Dynamic Sitting - Balance Support: Feet supported Dynamic Sitting - Level of Assistance: 1: +1 Total assist Sitting balance - Comments: Sitting EOB Static Standing Balance Static Standing - Level of Assistance: 2: Max assist(standing at the rail, able to briefly take L UE off rail) Extremity Assessment  RUE Assessment RUE Assessment: Exceptions to  WFL Passive Range of Motion (PROM) Comments: Pt used L UE to raise R UE overhead at bed level demonstrating full shoulder ROM Active Range of Motion (AROM) Comments: Appears 0/5 throughout, however, unable to formally assess 2/2 pt's cognition and grabbing/swatting of therapist with attempts to assess RUE Body System: Neuro Brunstrum levels for arm and hand: Arm Brunstrum level for arm: Stage I Presynergy RUE AROM (degrees) Overall AROM Right Upper Extremity: Deficits LUE Assessment LUE Assessment: Within Functional Limits RLE Assessment RLE Assessment: Exceptions to [facility] Passive Range of Motion (PROM) Comments: limited DF to neutral RLE Strength Right Hip Flexion: 0/5 Right Hip Extension: 1/5(hip extensor muscle activation seen functionally during sit<>stand) Right Knee Flexion: 0/5 Right Knee Extension: 0/5 Right Ankle Dorsiflexion: 0/5 Right Ankle Plantar Flexion: 0/5 LLE Assessment LLE Assessment: Within Functional Limits    Refer to Care Plan for Long Term Goals  Recommendations for other services: None   Discharge Criteria: Patient will be discharged from PT if patient refuses treatment 3 consecutive times without medical reason, if treatment goals not met, if there is a change in medical status, if patient makes no progress towards goals or if patient is discharged from hospital.  The  above assessment, treatment plan, treatment alternatives and goals were discussed and mutually agreed upon: No family available/patient unable  Netta Corrigan, PT, DPT, CSRS 07/19/2019, 12:16 PM

## 2019-07-19 NOTE — Evaluation (Signed)
Speech Language Pathology Assessment and Plan  Patient Details  Name: Melanie Madden MRN: [id number] Date of Birth: Nov 30, 1965  SLP Diagnosis: Aphasia;Apraxia;Cognitive Impairments;Dysphagia  Rehab Potential: Good ELOS: ~4 weeks    Today's Date: 07/19/2019 SLP Individual Time: 9470-9628 SLP Individual Time Calculation (min): 60 min   Problem List:  Patient Active Problem List   Diagnosis Date Noted  . 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 Hypernatremia   . Leukocytosis   . Essential hypertension   . Global aphasia   . Cytotoxic brain edema ([place]) 07/10/2019  . ICH (intracerebral hemorrhage) ([place]) 07/09/2019  . Anxiety state 02/21/2015  . Cephalalgia 12/21/2014   Past Medical History:  Past Medical History:  Diagnosis Date  . Allergy   . Anxiety   . Arthritis   . High triglycerides   . Kidney stones   . Migraines   . Recurrent sinus infections    Past Surgical History:  Past Surgical History:  Procedure Laterality Date  . ANTERIOR CRUCIATE LIGAMENT REPAIR Left   . BUNIONECTOMY Right   . LASIK    . LITHOTRIPSY    . MENISCUS REPAIR Left   . NASAL SEPTUM SURGERY    . SHOULDER SURGERY    . VARICOSE VEIN SURGERY Left     Assessment / Plan / Recommendation Clinical Impression   HPI: [name] is a 53 year old right-handed female with history of hypertension, anxiety, migraine headaches and tobacco abuse.  Per chart review and husband, due to cognition, patient lives with spouse and was independent prior to admission.  She presented on 07/09/2019 with right-sided weakness and aphasia after returning home from work.  Cranial CT scan showed intraparenchymal hematoma with a volume of 44 cc centered in the left basal ganglia most consistent with hypertensive hemorrhage.  5 mm rightward midline shift.  MRA of the head negative.  Echocardiogram with ejection fraction of 65%.  Patient did not receive TPA due to [place].  Admission labs SARS coronavirus negative.   Patient initially on 3% saline.  Cleviprex for blood pressure control later discontinued.  Her diet was advanced to dysphagia #2 nectar thick liquid.  She was cleared to begin subcutaneous heparin for DVT prophylaxis 07/14/2019.  Pt was admitted to CIR 07/18/19 and SLP evaluations were completed 07/19/19 with results as follows:  Clinical bedside swallow evaluation: Pt presents oropharyngeal dysphagia, suspect primarily due to right oral weakness and cognitive impairments that pose a moderate aspiration risk. Pt consumes trials of puree efficiently - complete oral clearance and efficient mastication observed. During consumption of Dys 2 solid breakfast, pt was highly impulsive and mildly resteless, requiring Max increased to Total A for self feeding and to reduce impulsivity for safe intake. Min oral residue notes with Dys 2 solids and 1 instance of right buccal pocketing observed with large bite. Pt consumed nectar thick liquids without s/sx aspiration. Thin liquid via straw resulted in strong reflexive coughing, likely due to pt's large quick sip. Overall awareness of solid and liquid boluses is poor. Recommend continue Dys 2/nectar diet with FULL assist for self feeding and use of swallow strategies, medicines crushed in applesauce.  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 Pt was unable to produce intelligible verbalizations throughout evaluation. Although she did attempt to participate in automatic speech tasks, pt only achieve vocalization of "ah" sound throughout tasks. Confrontation naming and reading attempts also unsuccessful. Pt followed basic 1-step commands with 10% accuracy. Attempts to imitate oral motor movements to  achieve lip rounding as well as commands for full oral mech exam unsuccessful. Pt unable to use picture communication board at this time, however ST will continue to assess as cognition and receptive abilities improve.  Pt shook head yes X1 in response to SLP's question, "do you want the lights to stay on?" - otherwise unable to establish consistent system for answering yes/no biographical and environmental questions. During meal consumption, pt did appear to use eye gaze in order to communicate preferences in terms of food items she would like to try. Pt's primary cognitive impairments impact sustained attention and basic functional problem solving during tasks. Unable to fully assess memory due to expressive and receptive deficits.   Recommend pt receive skilled ST services to address aforementioned dysphagia, cognition, and language deficits in order to maximize functional communication, diet safety and efficiency, increase safe participation in ADLs and reduce burden of care upon discharge.   Skilled Therapeutic Interventions          Administered bedside swallow and cognitive linguistic evaluations (see above for details) - results and overview of goals for admission were reviewed with pt.    SLP Assessment  Patient will need skilled Speech Lanaguage Pathology Services during CIR admission    Recommendations  SLP Diet Recommendations: Dysphagia 2 (Fine chop);Nectar Liquid Administration via: Cup;Straw Medication Administration: Crushed with puree Supervision: Staff to assist with self feeding;Full supervision/cueing for compensatory strategies Compensations: Minimize environmental distractions;Slow rate;Small sips/bites;Lingual sweep for clearance of pocketing Postural Changes and/or Swallow Maneuvers: Seated upright 90 degrees;Upright 30-60 min after meal Oral Care Recommendations: Oral care QID;Staff/trained caregiver to provide oral care Patient destination: Home Follow up Recommendations: Home Health SLP;24 hour supervision/assistance;Outpatient SLP Equipment Recommended: None recommended by SLP    SLP Frequency 3 to 5 out of 7 days   SLP Duration  SLP Intensity  SLP Treatment/Interventions ~4  weeks  Minumum of 1-2 x/day, 30 to 90 minutes  Cognitive remediation/compensation;Cueing hierarchy;Dysphagia/aspiration precaution training;Functional tasks;Patient/family education;Therapeutic Activities;Speech/Language facilitation;Multimodal communication approach;Internal/external aids;Environmental controls    Pain Pain Assessment Pain Scale: Faces Faces Pain Scale: No hurt     SLP Evaluation Cognition Overall Cognitive Status: Impaired/Different from baseline Arousal/Alertness: Awake/alert Orientation Level: Other (comment)(unable to truly accurately assess due to receptive and expressive language impairments, nonverbal) Attention: Sustained Sustained Attention: Impaired Sustained Attention Impairment: Verbal basic;Functional basic Memory: (UTA) Awareness: Impaired Awareness Impairment: Emergent impairment Problem Solving: Impaired Problem Solving Impairment: Functional basic Executive Function: (all impaired due to lower level deficits) Behaviors: Restless;Impulsive Safety/Judgment: Impaired  Comprehension Auditory Comprehension Overall Auditory Comprehension: Impaired Yes/No Questions: Impaired Basic Biographical Questions: 0-25% accurate Commands: Impaired One Step Basic Commands: 0-24% accurate Conversation: Simple Interfering Components: Attention;Motor planning Visual Recognition/Discrimination Discrimination: Not tested Reading Comprehension Reading Status: Unable to assess (comment)(Pt unable to verbalize or gesture to indicate reading comprehension) Expression Expression Primary Mode of Expression: Verbal Verbal Expression Overall Verbal Expression: Impaired Initiation: Impaired Level of Generative/Spontaneous Verbalization: (no verbalizations observed) Naming: Impairment Responsive: Not tested Confrontation: Impaired Convergent: Not tested Divergent: Not tested Verbal Errors: Other (comment)(unclear whether pt is aware of errors) Pragmatics:  Unable to assess Interfering Components: Attention Non-Verbal Means of Communication: Eye gaze(used eye gaze to inicate food preferences during breakfast) Written Expression Written Expression: Not tested Oral Motor Oral Motor/Sensory Function Overall Oral Motor/Sensory Function: Moderate impairment Facial ROM: Reduced right Facial Symmetry: Abnormal symmetry right Facial Strength: Reduced right Facial Sensation: Reduced right Lingual ROM: Other (Comment)(unable to protrude tongue on command) Mandible: Within Functional Limits Motor Speech Overall Motor Speech: Impaired  Respiration: Within functional limits Phonation: Normal Resonance: Within functional limits Articulation: Impaired Level of Impairment: Word Intelligibility: Intelligibility reduced Word: 0-24% accurate Phrase: Not tested Sentence: Not tested Conversation: Not tested Motor Planning: Impaired Level of Impairment: Word   Intelligibility: Intelligibility reduced Word: 0-24% accurate Phrase: Not tested Sentence: Not tested Conversation: Not tested  Bedside Swallowing Assessment General Date of Onset: 07/09/19 Previous Swallow Assessment: bedside swallow 07/12/19 Diet Prior to this Study: Dysphagia 2 (chopped);Nectar-thick liquids Temperature Spikes Noted: N/A Respiratory Status: Room air History of Recent Intubation: No Behavior/Cognition: Alert;Distractible;Requires cueing;Doesn't follow directions Oral Cavity - Dentition: Adequate natural dentition Self-Feeding Abilities: Total assist Vision: Impaired for self-feeding Patient Positioning: Upright in bed Baseline Vocal Quality: Normal Volitional Cough: Cognitively unable to elicit Volitional Swallow: Unable to elicit  Oral Care Assessment Does patient have any of the following "high(er) risk" factors?: None of the above Does patient have any of the following "at risk" factors?: Other - dysphagia;Diet - patient on thickened liquids Patient is AT  RISK: Order set for Adult Oral Care Protocol initiated -  "At Risk Patients" option selected (see row information) Ice Chips Ice chips: Not tested Thin Liquid Thin Liquid: Impaired Presentation: Straw Oral Phase Impairments: Poor awareness of bolus Pharyngeal  Phase Impairments: Suspected delayed Swallow;Cough - Immediate Nectar Thick Nectar Thick Liquid: Within functional limits Presentation: Cup Honey Thick Honey Thick Liquid: Not tested Puree Puree: Within functional limits Solid Solid: Impaired Presentation: Spoon Oral Phase Impairments: Reduced lingual movement/coordination;Poor awareness of bolus Oral Phase Functional Implications: Right lateral sulci pocketing BSE Assessment Risk for Aspiration Impact on safety and function: Moderate aspiration risk Other Related Risk Factors: Cognitive impairment  Short Term Goals: Week 1: SLP Short Term Goal 1 (Week 1): Pt will consume Dys2/nectar diet with min overt s/sx aspiration, efficient mastication and oral clearance with Max A multimodal cues. SLP Short Term Goal 2 (Week 1): Pt will consume trials of ice chips and/or thin liquids with Min overt s/sx aspiration X3 with Max A multimodal cues for use of slow rate strategy. SLP Short Term Goal 3 (Week 1): Pt will follow basic 1-step commands in 5/10 opportunities with Max A multimodal cues. SLP Short Term Goal 4 (Week 1): Pt will answer yes/no questions via multimodal means of communication with 50% accuracy provided Max A verbal/visual cues. SLP Short Term Goal 5 (Week 1): Pt will imitate vowel sounds with 50% accuracy given Max A multimodal cues. SLP Short Term Goal 6 (Week 1): Pt will sustain attention to tasks for 2 minute intervals with Max A multimodal cues for redirection.  Refer to Care Plan for Long Term Goals  Recommendations for other services: None   Discharge Criteria: Patient will be discharged from SLP if patient refuses treatment 3 consecutive times without medical  reason, if treatment goals not met, if there is a change in medical status, if patient makes no progress towards goals or if patient is discharged from hospital.  The above assessment, treatment plan, treatment alternatives and goals were discussed and mutually agreed upon: by patient and No family available/patient unable  Arbutus Leas 07/19/2019, 9:08 AM

## 2019-07-20 ENCOUNTER — Inpatient Hospital Stay (HOSPITAL_COMMUNITY): Payer: Self-pay

## 2019-07-20 ENCOUNTER — Inpatient Hospital Stay (HOSPITAL_COMMUNITY): Payer: Self-pay | Admitting: Occupational Therapy

## 2019-07-20 LAB — GLUCOSE, CAPILLARY: Glucose-Capillary: 145 mg/dL — ABNORMAL HIGH (ref 70–99)

## 2019-07-20 LAB — COMPREHENSIVE METABOLIC PANEL
ALT: 113 U/L — ABNORMAL HIGH (ref 0–44)
AST: 41 U/L (ref 15–41)
Albumin: 3 g/dL — ABNORMAL LOW (ref 3.5–5.0)
Alkaline Phosphatase: 69 U/L (ref 38–126)
Anion gap: 11 (ref 5–15)
BUN: 27 mg/dL — ABNORMAL HIGH (ref 6–20)
CO2: 26 mmol/L (ref 22–32)
Calcium: 10.1 mg/dL (ref 8.9–10.3)
Chloride: 109 mmol/L (ref 98–111)
Creatinine, Ser: 0.67 mg/dL (ref 0.44–1.00)
GFR calc Af Amer: >60 mL/min (ref >60)
GFR calc non Af Amer: >60 mL/min (ref >60)
Glucose, Bld: 157 mg/dL — ABNORMAL HIGH (ref 70–99)
Potassium: 4.1 mmol/L (ref 3.5–5.1)
Sodium: 146 mmol/L — ABNORMAL HIGH (ref 135–145)
Total Bilirubin: 0.6 mg/dL (ref 0.3–1.2)
Total Protein: 6.5 g/dL (ref 6.5–8.1)

## 2019-07-20 LAB — CBC WITH DIFFERENTIAL/PLATELET
Abs Immature Granulocytes: 0.18 10*3/uL — ABNORMAL HIGH (ref 0.00–0.07)
Basophils Absolute: 0 10*3/uL (ref 0.0–0.1)
Basophils Relative: 0 %
Eosinophils Absolute: 0.1 10*3/uL (ref 0.0–0.5)
Eosinophils Relative: 1 %
HCT: 45 % (ref 36.0–46.0)
Hemoglobin: 14.6 g/dL (ref 12.0–15.0)
Immature Granulocytes: 2 %
Lymphocytes Relative: 23 %
Lymphs Abs: 2 10*3/uL (ref 0.7–4.0)
MCH: 30 pg (ref 26.0–34.0)
MCHC: 32.4 g/dL (ref 30.0–36.0)
MCV: 92.4 fL (ref 80.0–100.0)
Monocytes Absolute: 0.7 10*3/uL (ref 0.1–1.0)
Monocytes Relative: 8 %
Neutro Abs: 6 10*3/uL (ref 1.7–7.7)
Neutrophils Relative %: 66 %
Platelets: 228 10*3/uL (ref 150–400)
RBC: 4.87 MIL/uL (ref 3.87–5.11)
RDW: 13.2 % (ref 11.5–15.5)
WBC: 9.1 10*3/uL (ref 4.0–10.5)
nRBC: 0 % (ref 0.0–0.2)

## 2019-07-20 NOTE — Care Management Note (Signed)
Inpatient [facility] Individual Statement of Services  Patient Name:  Melanie Madden  Date:  07/20/2019  Welcome to the [facility].  Our goal is to provide you with an individualized program based on your diagnosis and situation, designed to meet your specific needs.  With this comprehensive rehabilitation program, you will be expected to participate in at least 3 hours of rehabilitation therapies Monday-Friday, with modified therapy programming on the weekends.  Your rehabilitation program will include the following services:  Physical Therapy (PT), Occupational Therapy (OT), Speech Therapy (ST), 24 hour per day rehabilitation nursing, Therapeutic Recreaction (TR), Neuropsychology, Case Management (Social Worker), Rehabilitation Medicine, Nutrition Services and Pharmacy Services  Weekly team conferences will be held on Wednesday to discuss your progress.  Your Social Worker will talk with you frequently to get your input and to update you on team discussions.  Team conferences with you and your family in attendance may also be held.  Expected length of stay: 4 weeks  Overall anticipated outcome: min-mod level  Depending on your progress and recovery, your program may change. Your Social Worker will coordinate services and will keep you informed of any changes. Your Social Worker's name and contact numbers are listed  below.  The following services may also be recommended but are not provided by the [facility] will be made to provide these services after discharge if needed.  Arrangements include referral to agencies that provide these services.  Your insurance has been verified to be:  [place] Your primary doctor is:  Rachell Cipro  Pertinent information will be shared with your doctor and your  insurance company.  Social Worker:  Ovidio Kin, [place] or ([phone]  Information discussed with and copy given to patient by: [name], 07/20/2019, 2:01 PM

## 2019-07-20 NOTE — Progress Notes (Signed)
Speech Language Pathology Daily Session Note  Patient Details  Name: Melanie Madden MRN: [id number] Date of Birth: 1965/10/01  Today's Date: 07/20/2019 SLP Individual Time: 0807-0905 SLP Individual Time Calculation (min): 58 min  Short Term Goals: Week 1: SLP Short Term Goal 1 (Week 1): Pt will consume Dys2/nectar diet with min overt s/sx aspiration, efficient mastication and oral clearance with Max A multimodal cues. SLP Short Term Goal 2 (Week 1): Pt will consume trials of ice chips and/or thin liquids with Min overt s/sx aspiration X3 with Max A multimodal cues for use of slow rate strategy. SLP Short Term Goal 3 (Week 1): Pt will follow basic 1-step commands in 5/10 opportunities with Max A multimodal cues. SLP Short Term Goal 4 (Week 1): Pt will answer yes/no questions via multimodal means of communication with 50% accuracy provided Max A verbal/visual cues. SLP Short Term Goal 5 (Week 1): Pt will imitate vowel sounds with 50% accuracy given Max A multimodal cues. SLP Short Term Goal 6 (Week 1): Pt will sustain attention to tasks for 2 minute intervals with Max A multimodal cues for redirection.   Skilled Therapeutic Interventions: Skilled ST services focused on swallow and language skills. Pt was alert and attempted to get out of bed when SLP entered room. SLP provided education on where pt was located and purpose of SLP visit. When asked if she had eaten breakfast pt responded with "ah" (and continuously throughout session, in response to questions) given basic yes/no questions pt indicated with facial expression only she had not consumed breakfast. Throughout the session SLP attempted to instruct pt to respond to basic yes/no questions pertaining to wants/needs during functional task, by imitating gestures (head nod up/down)/facial expressions, pt demonstrated ability to indicate yes x3 (with head nod) and no x2 (facial grimace/expression, puckering lips) appropriately. Pt's husband [name])  called during session, SLP communicate with him via phone providing education on current SLP goals and pt's current progress, all questions were answered to satisfaction. Pt was able to follow 1 step basic commands during bed mobility tasks, rolling to right side during brief change by SLP and NT with max A verbal/visual cues with limited ability to roll left and unable to following directions to assist in sliding up in bed (pushing with feet/pulling up on bar) despite max A verbal/visual cues. SLP facilitated PO consumption of breakfast tray dys 2 and NTL via cup, demonstrating x2 delayed coughs with NTL, suggest due to consume of consecutive sips and large blouses. SLP provided hand over hand assistance and had to remove the cup from pt's grasp in order for pt to consume at an appropriate slower rate. Pt demonstrated ability to self-feed, however SLP fed pt 50% of tray due to poor attention and impulsive behavior. Pt demonstrated moderate right buccal pocketing that was eventually cleared to mild with repeat liquid washes, instructed by SLP. SLP provided oral care following completion of meal and removed minimal oral residue, pt even removed two small sausage pieces with fingers,however unable to imitate finger sweep during meal despite max A tactile/verbal/visual demonstration cues. Pt was left in room with call bell within reach and bed alarm set. ST recommends to continue skilled ST services.      Pain Pain Assessment Pain Scale: Faces Faces Pain Scale: No hurt  Therapy/Group: Individual Therapy  Liticia Gasior  [facility] 07/20/2019, 9:10 AM

## 2019-07-20 NOTE — Progress Notes (Signed)
Social Work Assessment and Plan   Patient Details  Name: Melanie Madden MRN: [id number] Date of Birth: 12-Dec-1965  Today's Date: 07/20/2019  Problem List:  Patient Active Problem List   Diagnosis Date Noted  . 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 Hypernatremia   . Leukocytosis   . Essential hypertension   . Global aphasia   . Cytotoxic brain edema ([place]) 07/10/2019  . ICH (intracerebral hemorrhage) ([place]) 07/09/2019  . Anxiety state 02/21/2015  . Cephalalgia 12/21/2014   Past Medical History:  Past Medical History:  Diagnosis Date  . Allergy   . Anxiety   . Arthritis   . High triglycerides   . Kidney stones   . Migraines   . Recurrent sinus infections    Past Surgical History:  Past Surgical History:  Procedure Laterality Date  . ANTERIOR CRUCIATE LIGAMENT REPAIR Left   . BUNIONECTOMY Right   . LASIK    . LITHOTRIPSY    . MENISCUS REPAIR Left   . NASAL SEPTUM SURGERY    . SHOULDER SURGERY    . VARICOSE VEIN SURGERY Left    Social History:  reports that she has quit smoking. She has never used smokeless tobacco. She reports current alcohol use. She reports that she does not use drugs.  Family / Support Systems Marital Status: Married Patient Roles: Spouse Spouse/Significant Other: [name] [phone]  [phone] Anticipated Caregiver: Husband and hired caregivers Ability/Limitations of Caregiver: Husband works at [employer] but will take [employer] and hire assist-aware wife will need care at DC-24 hr Caregiver Availability: 24/7 Family Dynamics: Close with one another, no family in area. They do have four cats which are their children and spoiled. Husband will need resources and support through this, never been through anything like this.  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 Will look toward her husband to make any decisions while here   Abuse/Neglect Abuse/Neglect Assessment Can Be Completed: Unable to assess, patient is non-responsive or altered mental status  Emotional Status Pt's affect, behavior and adjustment status: Need to get information from husband due to pt is not able to provide information during this time. He reports she was very independent and active prior to this. She drove and took care of their cats and the house. Recent Psychosocial Issues: healthy prior admission until this Psychiatric History: Hx-anxiety was not taking any medications for this prior to admission. When apprpriate will benefit from seeing neuro-psych for coping. Will await team's input for when ready to be seen. Would be helpful for husband to be seen alos due to overwhelmed with all of this Substance Abuse History: Quit tobacco no other issues  Patient / Family Perceptions, Expectations & Goals Pt/Family understanding of illness & functional limitations: Husband was here all last week to be here when MD's rounded and talked with them. He wants to know all information and start education early so will fully understand pt's deficits and ways to help her through this. Premorbid pt/family roles/activities: Wife, Art therapist, employee, cat-Mom, etc Anticipated changes in roles/activities/participation: resume Pt/family expectations/goals: Husband states: " I hope she does well here and makes a lot of progress. I know she will need someone with her at discharge  and begin planning for this."  [employer]: None Premorbid Home Care/DME Agencies: None Transportation available at discharge: Pt drove prior to admission-husband can provide  this Resource referrals recommended: Neuropsychology, Support group (specify)  Discharge Planning Living Arrangements: Spouse/significant other Support Systems: Spouse/significant other, Friends/neighbors Type of Residence: Private residence Administrator, sports: Multimedia programmer (specify)([employer]) Museum/gallery curator Resources: Employment, Secondary school teacher Screen Referred: No Living Expenses: Medical laboratory scientific officer Management: Patient, Spouse Does the patient have any problems obtaining your medications?: No Home Management: Patient, now husband will Patient/Family Preliminary Plans: Return home with hsuband who is planning on taking a FMLA and then hiring assist while he is at work. Will give him a private duty list to begin this and assist with this. Husband is wanting to learn as much information as possible due to he is one who if has the information he can learn from this. Sw Barriers to Discharge: Decreased caregiver support Sw Barriers to Discharge Comments: Limited support husband Social Work Anticipated Follow Up Needs: HH/OP, Support Group  Clinical Impression Unfortunate female who has had a significant bleed and has severe deficits. Her husband is involved and plans on taking a FMLA and hiring assist when discharged home. They have no other family members here and limited friends support. Will assist with the discharge plans and encourage husband to begin early with the hiring process. Will assess pt once appropriate and have neuro-psych see when appropriate.   [name] 07/20/2019, 1:57 PM

## 2019-07-20 NOTE — Progress Notes (Signed)
Patient lethargic but able to follow directions, Assessment done vitals stable except for elevated HR, FSBS 145, husband at bedside, on-call notified. No new order given. We continue to monitor.

## 2019-07-20 NOTE — Progress Notes (Signed)
Physical Therapy Session Note  Patient Details  Name: Melanie Madden MRN: [id number] Date of Birth: Jul 03, 1966  Today's Date: 07/20/2019 PT Individual Time: 1030-1115 PT Individual Time Calculation (min): 45 min   Short Term Goals: Week 1:  PT Short Term Goal 1 (Week 1): Pt will perform bed<>chair transfers with max assist +1 PT Short Term Goal 2 (Week 1): Pt will perform sit<>stand with max assist +1 and maintain midline orientation PT Short Term Goal 3 (Week 1): Pt will maintain static sitting balance with min assist x 5 minutes  Skilled Therapeutic Interventions/Progress Updates:  Pt lying on R side in bed, with very flat affect.  Pt vocalized several times, attempting to speak with mouth wide open.  She smiled when PT when cat meowing sounds. (pt has cat tattoos on LEs.) Pt also moaned but was unable to use communication page to express herself..  Pt noted to be wet and soiled in brief.  She was resistant to rolling and changing initially, but did bridge up once slightly with cues to assist.  Rolling R independently, L with mod assist.   PT donned clean brief.  neuromuscular re-education : PROM L hip, knee and ankle with tactile cues for activation.  No active motors detected.  3 beat ankle clonus R; pt moaned initially when ankle ranged by PT, but she relaxed with repetition and time.   PT placed smaller tilt in space w/c in room.    At end of session, pt in bed with needs at hand and bed alarm set.     Therapy Documentation Precautions:  Precautions Precautions: Fall, Other (comment) Precaution Comments: pusher, right hemiparesis Restrictions Weight Bearing Restrictions: No        Therapy/Group: Individual Therapy  Rowan Blaker 07/20/2019, 11:20 AM

## 2019-07-20 NOTE — Progress Notes (Signed)
Patient information reviewed and entered into [facility] by Becky Marabeth Melland, PPS coordinator. Information including medical coding, function ability, and quality indicators will be reviewed and updated through discharge.   

## 2019-07-20 NOTE — Progress Notes (Signed)
Melanie Madden  Subjective/Complaints: Sleeping without evidence of restlessness   ROS: Unable to assess due to language  Objective: Vital Signs: Blood pressure (!) 146/85, pulse 97, temperature 99 F (37.2 C), temperature source Axillary, resp. rate 16, height 5\' 7"  (1.702 m), weight 57.7 kg, SpO2 99 %. No results found. Recent Labs    07/18/19 0316 07/18/19 1504  WBC 8.3 11.5*  HGB 14.0 14.9  HCT 43.5 44.9  PLT 175 205   Recent Labs    07/18/19 0316 07/18/19 1504  NA 151*  --   K 4.0  --   CL 113*  --   CO2 29  --   GLUCOSE 159*  --   BUN 26*  --   CREATININE 0.70 0.89  CALCIUM 9.8  --     Physical Exam: BP (!) 146/85   Pulse 97   Temp 99 F (37.2 C) (Axillary)   Resp 16   Ht 5\' 7"  (1.702 m)   Wt 57.7 kg   SpO2 99%   BMI 19.92 kg/m  Constitutional: No distress . Vital signs reviewed. HENT: Normocephalic.  Atraumatic. Eyes: EOMI. No discharge. Cardiovascular: No JVD. Respiratory: Normal effort.  No stridor. GI: Non-distended. Skin: Warm and dry.  Intact. Psych: Normal mood.  Normal behavior. Musc: No edema in extremities.  No tenderness in extremities. Neuro: Alert Nonverbal Global aphasia Exam limited due to lack of ability to follow commands, however seen spontaneously moving LUE and LLE appears to sense pain on right side, unchanged 0/5 RUE and 0/5 RLE Tone reduced on Right side  Skin: Skin is warm and dry.  Psychiatric:  Unable to assess due to mentation   Assessment/Plan: 1. Functional deficits secondary to left basal ganglia hypertensive hemorrhage which require 3+ hours per day of interdisciplinary therapy in a comprehensive inpatient rehab setting.  Physiatrist is providing close team supervision and 24 hour management of active medical problems listed below.  Physiatrist and rehab team continue to assess barriers to discharge/monitor patient progress toward functional and medical  goals  Care Tool:  Bathing    Body parts bathed by patient: Face   Body parts bathed by helper: Right arm, Right lower leg, Left arm, Left lower leg, Chest, Abdomen, Front perineal area, Buttocks, Right upper leg, Left upper leg     Bathing assist Assist Level: 2 Helpers     Upper Body Dressing/Undressing Upper body dressing   What is the patient wearing?: Pull over shirt    Upper body assist Assist Level: Dependent - Patient 0%    Lower Body Dressing/Undressing Lower body dressing      What is the patient wearing?: Pants, Incontinence brief     Lower body assist Assist for lower body dressing: Dependent - Patient 0%     Toileting Toileting    Toileting assist Assist for toileting: Dependent - Patient 0%     Transfers Chair/bed transfer  Transfers assist  Chair/bed transfer activity did not occur: Safety/medical concerns  Chair/bed transfer assist level: 2 Helpers     Locomotion Ambulation   Ambulation assist   Ambulation activity did not occur: Safety/medical concerns          Walk 10 feet activity   Assist  Walk 10 feet activity did not occur: Safety/medical concerns        Walk 50 feet activity   Assist Walk 50 feet with 2 turns activity did not occur: Safety/medical concerns  Walk 150 feet activity   Assist Walk 150 feet activity did not occur: Safety/medical concerns         Walk 10 feet on uneven surface  activity   Assist Walk 10 feet on uneven surfaces activity did not occur: Safety/medical concerns         Wheelchair     Assist Will patient use wheelchair at discharge?: Yes Type of Wheelchair: Manual    Wheelchair assist level: Dependent - Patient 0%(TIS w/c.) Max wheelchair distance: 150 ft    Wheelchair 50 feet with 2 turns activity    Assist        Assist Level: Dependent - Patient 0%   Wheelchair 150 feet activity     Assist     Assist Level: Dependent - Patient 0%       Medical Problem List and Plan: 1.  Right side weakness and aphasia secondary to left basal ganglia hypertensive hemorrhage  CIR PT, OT  2.  Antithrombotics: -DVT/anticoagulation: Subcutaneous heparin initiated 07/14/2019.  Monitor for any bleeding episodes             -antiplatelet therapy: N/A 3. Pain Management: Tylenol as needed 4. Mood: Provide emotional support             -antipsychotic agents: N/A 5. Neuropsych: This patient is not capable of making decisions on her own behalf. 6. Skin/Wound Care: Routine skin checks 7. Fluids/Electrolytes/Nutrition: Routine in and outs.  CMP ordered for tomorrow.  8.  Hypertension.  Lisinopril 20 mg twice daily, Norvasc 10 mg daily.               Monitor with increased mobility 9.  Hypernatremia-likely due to hypertonic saline             Na 151 on 12/19, labs ordered for today  10.  Leukocytosis             WBCs 11.5 on 12/19, labs ordered for today , low grade temp 99 Ax no cough will check UA             Afebrile  LOS: 2 days A FACE TO FACE EVALUATION WAS PERFORMED  Melanie Madden 07/20/2019, 9:40 AM

## 2019-07-20 NOTE — Progress Notes (Signed)
Occupational Therapy Session Note  Patient Details  Name: Melanie Madden MRN: [id number] Date of Birth: 05-28-1966  Today's Date: 07/20/2019 OT Individual Time: 1300-1414 OT Individual Time Calculation (min): 74 min   Short Term Goals: Week 1:  OT Short Term Goal 1 (Week 1): Pt will tolerate sitting EOB/EOM for 5 minutes with no more than min A in prep for functional task OT Short Term Goal 2 (Week 1): Pt will attend to R UE/LE during bathing task with no more than min cuing. OT Short Term Goal 3 (Week 1): Pt will complete LB dressing max A sit>stand. OT Short Term Goal 4 (Week 1): Pt will consistently complete basic transfers with +1 assist in order to decrease caregiver burden.  Skilled Therapeutic Interventions/Progress Updates:    Pt greeted in bed with RN, finishing up with lunch. OT performed oral care using nectar-thickened water after. Cued pt to spit into a cup however pt swallowed instead. Set pt up in chair position and she then completed UB/LB self care. HOH for initiation required 90% of the time during bathing tasks. HOH for using Rt hand functionally as well. She was able to utilize figure 4 position bilaterally, however pt unable to fully attend to Rt to wash/dress this side unassisted. Note that she did initiate anterior weight shifting using the Lt bedrail when lowering shirt over trunk, requiring Mod A for this. Able to assist with elevating pants over thighs bilaterally. She also initiated using 1 legged bridge to lift pants over hips on Lt side. Total A for hygiene while rolling in supine, Mod A for rolling towards Rt side and Total A for Lt. Note pt had incontinent BM in brief. She also had a small abrasion in rectum with RN made aware. Pt rolled in this manner during brief change and when fully lifting pants over hips. She assisted OT with donning Rt gripper sock after OT started her off at the toes! While in chair position pt drank her nectar-thickened Ensure with HOH to make  sure she slowed down. Pt very impulsive and fast with drinking. No s/s aspiration observed. She then completed oral care once again with [facility] for brushing teeth vs sucking brush. Used nectar-thickened water to rinse however pt swallowed water when cued to spit. Throughout session, pt initiated correcting Rt lean 10% of the time using bedrail. For all other times she needed Max A to facilitate upright neutral trunk. At end of session pt remained in bed with all needs, 4 bedrails up, and bed alarm set. Tx focus placed on following 1 step instruction, ADL retraining, praxis, trunk control, Rt NMR, and Rt attention.    Therapy Documentation Precautions:  Precautions Precautions: Fall, Other (comment) Precaution Comments: pusher, right hemiparesis Restrictions Weight Bearing Restrictions: No Vital Signs: Therapy Vitals Temp: 98.1 F (36.7 C) Temp Source: Oral Pulse Rate: 85 Resp: 18 BP: 111/72 Patient Position (if appropriate): Lying Oxygen Therapy SpO2: 98 % O2 Device: Room Air Pain: No s/s pain during tx   ADL:       Therapy/Group: Individual Therapy  Melanie Madden A Tori Cupps 07/20/2019, 4:02 PM

## 2019-07-21 ENCOUNTER — Inpatient Hospital Stay (HOSPITAL_COMMUNITY): Payer: 59

## 2019-07-21 ENCOUNTER — Inpatient Hospital Stay (HOSPITAL_COMMUNITY): Payer: 59 | Admitting: Physical Therapy

## 2019-07-21 ENCOUNTER — Inpatient Hospital Stay (HOSPITAL_COMMUNITY): Payer: 59 | Admitting: Occupational Therapy

## 2019-07-21 MED ORDER — ENOXAPARIN SODIUM 40 MG/0.4ML ~~LOC~~ SOLN
40.0000 mg | SUBCUTANEOUS | Status: DC
  Administered 2019-07-21 – 2019-08-21 (×32): 40 mg via SUBCUTANEOUS
  Filled 2019-07-21 (×34): qty 0.4

## 2019-07-21 NOTE — Progress Notes (Signed)
Occupational Therapy Session Note  Patient Details  Name: Melanie Madden MRN: [id number] Date of Birth: 11-18-1965  Today's Date: 07/21/2019 OT Individual Time: 1400-1500 OT Individual Time Calculation (min): 60 min    Short Term Goals: Week 1:  OT Short Term Goal 1 (Week 1): Pt will tolerate sitting EOB/EOM for 5 minutes with no more than min A in prep for functional task OT Short Term Goal 2 (Week 1): Pt will attend to R UE/LE during bathing task with no more than min cuing. OT Short Term Goal 3 (Week 1): Pt will complete LB dressing max A sit>stand. OT Short Term Goal 4 (Week 1): Pt will consistently complete basic transfers with +1 assist in order to decrease caregiver burden.  Skilled Therapeutic Interventions/Progress Updates:    1:1 Pt in bed when arrived. Pt required max A to come to EOB and maintain sitting balance with facilitation to inhibit pushing towards the hemi side. Pt performed max A transfer squat pivot into the w/c. Pt continues to keep flexed trunk posture. Pt taken to the gym. Performed total A +2 transfer to the mat (towards the left onto the mat. Focus on tolerating sitting EOM with facilitation of upright posture, functional reach and visual tracking from right field to left with extra time. Use mirror for cue for visual feedback. Pt required min to mod A for sitting balance with right hand engaged in task. Transitioned into sit to stands with three musketeer style to promote upright posture. Pt able to initiate powering up into standing. Difficulty with maintain/ sustaining upright trunk posture/ fatiguing quickly requiring total A in standing. Perform 3 sit to stands with mod A +2. Also trial of using the Lite Gait to better faciltate standing posture and tolerance to upright standing and decr pushing tendencies. Able to tolerate standing for ~2-3 min at a time. Total A +2 transfer back to chair. Left sitting up in w/c to continue to promote sitting tolerance with lap  tray.  Therapy Documentation Precautions:  Precautions Precautions: Fall, Other (comment) Precaution Comments: pusher, right hemiparesis Restrictions Weight Bearing Restrictions: No General:   Vital Signs: Therapy Vitals Temp: 98.8 F (37.1 C) Temp Source: Oral Pulse Rate: (!) 108 Resp: 18 BP: 104/70 Patient Position (if appropriate): Lying Oxygen Therapy SpO2: 98 % O2 Device: Room Air Pain:  no indications of pain  Therapy/Group: Individual Therapy  [name] [facility] 07/21/2019, 9:08 PM

## 2019-07-21 NOTE — Progress Notes (Signed)
Physical Therapy Session Note  Patient Details  Name: Melanie Madden MRN: [id number] Date of Birth: 1965-08-26  Today's Date: 07/21/2019 PT Individual Time: 1100-1200 PT Individual Time Calculation (min): 60 min   Short Term Goals: Week 1:  PT Short Term Goal 1 (Week 1): Pt will perform bed<>chair transfers with max assist +1 PT Short Term Goal 2 (Week 1): Pt will perform sit<>stand with max assist +1 and maintain midline orientation PT Short Term Goal 3 (Week 1): Pt will maintain static sitting balance with min assist x 5 minutes  Skilled Therapeutic Interventions/Progress Updates: Pt presented in TIS at nsg station. PTA transported to day room and participated in [place] for reciprocal activity and to initiate task. PTA provided assistance for pushing with RLE and provided max multimodal cues for initiate pushing with LLE. Performed 10 cycles x 2. Pt then transported to ortho gym to attempt standing frame. Once pt removed lap tray pt impulsively pushing self to R with PTA providing max multimodal cues to minimize pushing. As PTA adjusting pt noted that pt incontinent of bladder. Pt then transported back to room and performed SB transfer to bed maxA x 1. Pt required frequent re-direction due to pt placing RLE on bed despite verbal description of what PTA was doing (placing SB). Pt ultimately able to perform SB transfer to L maxA and with increased time pt performed rolling to L maxA and R minA to allow PTA to perform peri-care. PTA then performed PROM to RLE all planes. Pt left resting in bed with bed alarm on and needs met.      Therapy Documentation Precautions:  Precautions Precautions: Fall, Other (comment) Precaution Comments: pusher, right hemiparesis Restrictions Weight Bearing Restrictions: No General:   Vital Signs: Therapy Vitals Pulse Rate: (!) 104 Resp: 17 BP: 123/84 Patient Position (if appropriate): Sitting Oxygen Therapy SpO2: 99 % O2 Device: Room Air Pain:    Mobility:   Locomotion :    Trunk/Postural Assessment :    Balance:   Exercises:   Other Treatments:      Therapy/Group: Individual Therapy  Allyn Bertoni 07/21/2019, 4:12 PM

## 2019-07-21 NOTE — Progress Notes (Signed)
Speech Language Pathology Daily Session Note  Patient Details  Name: Melanie Madden MRN: [id number] Date of Birth: Aug 16, 1965  Today's Date: 07/21/2019 SLP Individual Time: 1010-1050 SLP Individual Time Calculation (min): 40 min  Short Term Goals: Week 1: SLP Short Term Goal 1 (Week 1): Pt will consume Dys2/nectar diet with min overt s/sx aspiration, efficient mastication and oral clearance with Max A multimodal cues. SLP Short Term Goal 2 (Week 1): Pt will consume trials of ice chips and/or thin liquids with Min overt s/sx aspiration X3 with Max A multimodal cues for use of slow rate strategy. SLP Short Term Goal 3 (Week 1): Pt will follow basic 1-step commands in 5/10 opportunities with Max A multimodal cues. SLP Short Term Goal 4 (Week 1): Pt will answer yes/no questions via multimodal means of communication with 50% accuracy provided Max A verbal/visual cues. SLP Short Term Goal 5 (Week 1): Pt will imitate vowel sounds with 50% accuracy given Max A multimodal cues. SLP Short Term Goal 6 (Week 1): Pt will sustain attention to tasks for 2 minute intervals with Max A multimodal cues for redirection.  Skilled Therapeutic Interventions: Skilled ST services focused on swallow and language skills. SLP facilitated expressive language while attempting to imitate vowel sounds and oral motor movement position give picture cards and SLP demonstration, pt was able to imitate 1 out 6 oral motor movements (lips closed/sealed) on command and 3 variations spontaneously (lips sealed, lip pucker and open mouth "ah.") Pt was able to produce "ah" on command in 4 out 6 opportunities with max A verbal/visual cues. Pt demonstrated ability to match common objects to photographs in a field of 2 in 4 out 5 opportunities with max A verbal/visual cues. Pt demonstrated accurate response to yes/no questions via gestures/facial expression, pertaining to common object naming in 2 out 7 opportunities. Pt demonstrated reduced  sustained attention closing eyes in 1-2 minute intervals requiring verbal/tatcile cues throughout session. SLP provided oral care prior to trials of ice chips x6, pt demonstrated no overt s/s aspiration, however little laryngeal elevation or excursion was observed at bedside. Pt was left behind nurse's station for supervision. ST recommends to continue skilled ST services.      Pain Pain Assessment Pain Score: 0-No pain  Therapy/Group: Individual Therapy  Melanie Madden  [facility] 07/21/2019, 12:03 PM

## 2019-07-21 NOTE — IPOC Note (Signed)
Overall Plan of Care [facility]) Patient Details Name: Melanie Madden MRN: [id number] DOB: [birth year]  Admitting Diagnosis: ICH (intracerebral hemorrhage) [facility])  Hospital Problems: Principal Problem:   ICH (intracerebral hemorrhage) ([place]) Active Problems:   Infarction of left basal ganglia (HCC)     Functional Problem List: Nursing Skin Integrity, Safety, Pain, Medication Management, Bladder, Bowel, Behavior, Nutrition, Endurance  PT Balance, Behavior, Edema, Endurance, Motor, Pain, Safety  OT Balance, Safety, Behavior, Sensory, Cognition, Skin Integrity, Vision, Endurance, Motor, Pain, Nutrition, Perception  SLP Cognition, Linguistic, Motor, Nutrition  TR         Basic ADL's: OT Eating, Grooming, Bathing, Dressing, Toileting     Advanced  ADL's: OT       Transfers: PT Bed Mobility, Bed to Chair, Car, Chief Operating Officer: PT Ambulation, Emergency planning/management officer, Stairs     Additional Impairments: OT Fuctional Use of Upper Extremity  SLP Swallowing, Communication, Social Cognition comprehension, expression Problem Solving, Attention  TR      Anticipated Outcomes Item Anticipated Outcome  Self Feeding Supervision/set-up  Swallowing  Min A   Basic self-care  Min A  Toileting  Min A   Bathroom Transfers Min A  Bowel/Bladder  Min assist  Transfers  min assist  Locomotion  mod assist  Communication  Mod A  Cognition  Mod A  Pain  <3 on a 0-10 pain scale  Safety/Judgment  min assist   Therapy Plan: PT Intensity: Minimum of 1-2 x/day ,45 to 90 minutes PT Frequency: 5 out of 7 days PT Duration Estimated Length of Stay: 4 weeks OT Intensity: Minimum of 1-2 x/day, 45 to 90 minutes OT Frequency: 5 out of 7 days OT Duration/Estimated Length of Stay: 3.5-4 weeks SLP Intensity: Minumum of 1-2 x/day, 30 to 90 minutes SLP Frequency: 3 to 5 out of 7 days SLP Duration/Estimated Length of Stay: ~4 weeks   Due to the current state of emergency, patients  may not be receiving their 3-hours of Medicare-mandated therapy.   Team Interventions: Nursing Interventions Patient/Family Education, Bladder Management, Bowel Management, Pain Management, Disease Management/Prevention, Medication Management, Psychosocial Support, Discharge Planning, Dysphagia/Aspiration Precaution Training, Skin Care/Wound Management  PT interventions Ambulation/gait training, DME/adaptive equipment instruction, UE/LE Strength taining/ROM, Psychosocial support, Balance/vestibular training, Functional electrical stimulation, UE/LE Coordination activities, Cognitive remediation/compensation, Functional mobility training, Splinting/orthotics, Visual/perceptual remediation/compensation, Medical laboratory scientific officer reintegration, Wheelchair propulsion/positioning, IT trainer, Neuromuscular re-education, Discharge planning, Pain management, Therapeutic Activities, Patient/family education, Disease management/prevention, Therapeutic Exercise  OT Interventions Training and development officer, Discharge planning, Functional electrical stimulation, Pain management, Self Care/advanced ADL retraining, Therapeutic Activities, UE/LE Coordination activities, Visual/perceptual remediation/compensation, Therapeutic Exercise, Skin care/wound managment, Patient/family education, Functional mobility training, Disease mangement/prevention, Cognitive remediation/compensation, DME/adaptive equipment instruction, Neuromuscular re-education, Psychosocial support, Splinting/orthotics, UE/LE Strength taining/ROM, Wheelchair propulsion/positioning, Community reintegration  SLP Interventions Cognitive remediation/compensation, English as a second language teacher, Dysphagia/aspiration precaution training, Functional tasks, Patient/family education, Therapeutic Activities, Speech/Language facilitation, Multimodal communication approach, Internal/external aids, Environmental controls  TR Interventions    SW/CM Interventions Discharge Planning, Psychosocial  Support, Patient/Family Education   Barriers to Discharge MD  Medical stability  Nursing Decreased caregiver support, Medical stability, Incontinence, Lack of/limited family support, Medication compliance    PT Home environment access/layout 2-3 STE no rails  OT      SLP      SW Decreased caregiver support Limited support husband   Team Discharge Planning: Destination: PT-Home ,OT- Home , SLP-Home Projected Follow-up: PT-Home health PT, OT-  Home health OT, 24 hour supervision/assistance, SLP-Home Health SLP, 24 hour supervision/assistance, Outpatient SLP  Projected Equipment Needs: PT-To be determined, OT- To be determined, SLP-None recommended by SLP Equipment Details: PT- , OT-  Patient/family involved in discharge planning: PT- Patient unable/family or caregiver not available,  OT-Patient unable/family or caregiver not available, SLP-Patient unable/family or caregive not available  MD ELOS: 21-25d Medical Rehab Prognosis:  Fair Assessment:  53 year old right-handed female with history of hypertension, anxiety, migraine headaches and tobacco abuse.  Per chart review and husband, due to cognition, patient lives with spouse and was independent prior to admission.  She presented on 07/09/2019 with right-sided weakness and aphasia after returning home from work.  Cranial CT scan showed intraparenchymal hematoma with a volume of 44 cc centered in the left basal ganglia most consistent with hypertensive hemorrhage.  5 mm rightward midline shift.  MRA of the head negative.  Echocardiogram with ejection fraction of 65%.  Patient did not receive TPA due to [place].  Admission labs SARS coronavirus negative.  Patient initially on 3% saline.  Cleviprex for blood pressure control later discontinued.  Her diet was advanced to dysphagia #2 nectar thick liquid.  She was cleared to begin subcutaneous heparin for DVT prophylaxis 07/14/2019.  Therapy evaluations completed and patient was admitted for a  comprehensive rehab program   Now requiring 24/7 Rehab RN,MD, as well as CIR level PT, OT and SLP.  Treatment team will focus on ADLs and mobility with goals set at Min/Mod A See Team Conference Notes for weekly updates to the plan of care

## 2019-07-21 NOTE — Progress Notes (Signed)
Occupational Therapy Session Note  Patient Details  Name: Melanie Madden MRN: [id number] Date of Birth: [birth year]  Today's Date: 07/21/2019 OT Individual Time: [id number] OT Individual Time Calculation (min): 45 min    Short Term Goals: Week 1:  OT Short Term Goal 1 (Week 1): Pt will tolerate sitting EOB/EOM for 5 minutes with no more than min A in prep for functional task OT Short Term Goal 2 (Week 1): Pt will attend to R UE/LE during bathing task with no more than min cuing. OT Short Term Goal 3 (Week 1): Pt will complete LB dressing max A sit>stand. OT Short Term Goal 4 (Week 1): Pt will consistently complete basic transfers with +1 assist in order to decrease caregiver burden.  Skilled Therapeutic Interventions/Progress Updates:    1:1 Pt in bed when arrived and had been incontinent of urine. Performed rolling in bed(with max A )to allow therapist to assist with changing brief and performing hygiene with total A. Attempted to have pt perform peri hygiene but pt unable to follow through after hand over hand. Shorts donned in supine with total A (again with rolling to pull up pants). Pt transitioned to EOB with total A. Initialing  pt presenting with significant pushing towards hemi side required total A to stop and self direct. Pt in very flexed position in sitting and required max facilitation to achieve full upright trunk posture. Max A squat pivot transfer to the tilt in space w/c towards her right. Once in chair pt pushing herself over the right arm rest with decr body awareness of falling over the arm rest with total A to correct back to midline. At sink participated in UB bathing and dressing with total A with Hand over hand for follow through. Pt demonstrating ideational apraxia with all items handed to her went into her mouth. Total A to redirect. Full lap tray placed over w/c with pillow positioned along trunk on right side to discouraging pushing her self over in the w/c when alone. Taken  to the RN station for safety in between therapies.   Therapy Documentation Precautions:  Precautions Precautions: Fall, Other (comment) Precaution Comments: pusher, right hemiparesis Restrictions Weight Bearing Restrictions: No General:   Vital Signs: Therapy Vitals Pulse Rate: (!) 105 BP: (!) 148/81 Patient Position (if appropriate): Sitting Pain:  no c/o pain in session    Therapy/Group: Individual Therapy  [name] [facility] 07/21/2019, 10:22 AM

## 2019-07-21 NOTE — Progress Notes (Signed)
Late Entry from 12/19 admission: Patient admitted to [place] during shift accompanied by husband. Patient nonverbal, shows no symptoms of pain at time.  Safety and visitor policy reviewed  with designated visitor.  Adria Devon, LPN

## 2019-07-21 NOTE — Progress Notes (Signed)
[place] PHYSICAL MEDICINE & REHABILITATION PROGRESS NOTE  Subjective/Complaints: Sleeping without evidence of restlessness  Eats 100% meal this am but requires feeding   ROS: Unable to assess due to language  Objective: Vital Signs: Blood pressure (!) 148/89, pulse (!) 102, temperature 97.8 F (36.6 C), temperature source Oral, resp. rate 18, height 5\' 7"  (1.702 m), weight 57.7 kg, SpO2 96 %. No results found. Recent Labs    07/18/19 1504 07/20/19 1018  WBC 11.5* 9.1  HGB 14.9 14.6  HCT 44.9 45.0  PLT 205 228   Recent Labs    07/18/19 1504 07/20/19 1018  NA  --  146*  K  --  4.1  CL  --  109  CO2  --  26  GLUCOSE  --  157*  BUN  --  27*  CREATININE 0.89 0.67  CALCIUM  --  10.1    Physical Exam: BP (!) 148/89 (BP Location: Left Arm)   Pulse (!) 102   Temp 97.8 F (36.6 C) (Oral)   Resp 18   Ht 5\' 7"  (1.702 m)   Wt 57.7 kg   SpO2 96%   BMI 19.92 kg/m  Constitutional: No distress . Vital signs reviewed. HENT: Normocephalic.  Atraumatic. Eyes: EOMI. No discharge. Cardiovascular: No JVD. Respiratory: Normal effort.  No stridor. GI: Non-distended. Skin: Warm and dry.  Intact. Psych: Normal mood.  Normal behavior. Musc: No edema in extremities.  No tenderness in extremities. Neuro: Alert Nonverbal Global aphasia Exam limited due to lack of ability to follow commands, however seen spontaneously moving LUE and LLE appears to sense pain on right side, unchanged 0/5 RUE and 0/5 RLE Tone reduced on Right side  Skin: Skin is warm and dry.  Psychiatric:  Unable to assess due to mentation   Assessment/Plan: 1. Functional deficits secondary to left basal ganglia hypertensive hemorrhage which require 3+ hours per day of interdisciplinary therapy in a comprehensive inpatient rehab setting.  Physiatrist is providing close team supervision and 24 hour management of active medical problems listed below.  Physiatrist and rehab team continue to assess barriers to  discharge/monitor patient progress toward functional and medical goals  Care Tool:  Bathing    Body parts bathed by patient: Face   Body parts bathed by helper: Right arm, Right lower leg, Left arm, Left lower leg, Chest, Abdomen, Front perineal area, Buttocks, Right upper leg, Left upper leg     Bathing assist Assist Level: Total Assistance - Patient < 25%     Upper Body Dressing/Undressing Upper body dressing   What is the patient wearing?: Pull over shirt    Upper body assist Assist Level: Maximal Assistance - Patient 25 - 49%    Lower Body Dressing/Undressing Lower body dressing      What is the patient wearing?: Pants, Incontinence brief     Lower body assist Assist for lower body dressing: Dependent - Patient 0%     Toileting Toileting    Toileting assist Assist for toileting: Dependent - Patient 0%     Transfers Chair/bed transfer  Transfers assist  Chair/bed transfer activity did not occur: Safety/medical concerns  Chair/bed transfer assist level: 2 Helpers     Locomotion Ambulation   Ambulation assist   Ambulation activity did not occur: Safety/medical concerns          Walk 10 feet activity   Assist  Walk 10 feet activity did not occur: Safety/medical concerns        Walk 50 feet activity  Assist Walk 50 feet with 2 turns activity did not occur: Safety/medical concerns         Walk 150 feet activity   Assist Walk 150 feet activity did not occur: Safety/medical concerns         Walk 10 feet on uneven surface  activity   Assist Walk 10 feet on uneven surfaces activity did not occur: Safety/medical concerns         Wheelchair     Assist Will patient use wheelchair at discharge?: Yes Type of Wheelchair: Manual    Wheelchair assist level: Dependent - Patient 0%(TIS w/c.) Max wheelchair distance: 150 ft    Wheelchair 50 feet with 2 turns activity    Assist        Assist Level: Dependent - Patient  0%   Wheelchair 150 feet activity     Assist     Assist Level: Dependent - Patient 0%      Medical Problem List and Plan: 1.  Right side weakness and aphasia secondary to left basal ganglia hypertensive hemorrhage  CIR PT, OT , SLP- team conf in am  2.  Antithrombotics: -DVT/anticoagulation: Subcutaneous heparin initiated 07/14/2019.  Monitor for any bleeding episodes- change to enoxaparin to reduce freq of injection              -antiplatelet therapy: N/A 3. Pain Management: Tylenol as needed 4. Mood: Provide emotional support             -antipsychotic agents: N/A 5. Neuropsych: This patient is not capable of making decisions on her own behalf. 6. Skin/Wound Care: Routine skin checks 7. Fluids/Electrolytes/Nutrition: Routine in and outs.  CMP ordered for tomorrow.  8.  Hypertension.  Lisinopril 20 mg twice daily, Norvasc 10 mg daily.               Vitals:   07/20/19 1957 07/21/19 0441  BP: 118/80 (!) 148/89  Pulse: (!) 104 (!) 102  Resp: 16 18  Temp: 98.4 F (36.9 C) 97.8 F (36.6 C)  SpO2: 97% 96%  generally well controlled - mild tachy may be autonomic dysfunction related to [place]  9.  Hypernatremia-likely due to hypertonic saline             Na 151 on 12/19, labs ordered for today  10.  Leukocytosis             WBCs 11.5 on 12/19, labs ordered for today , low grade temp 99 Ax no cough will check UA             Afebrile 11.  Transaminasemia- no recent tylenol use isolated to ALT, abd exam is normal - monitor , ? ETOH or Tylenol use prior to [place]  LOS: 3 days A FACE TO FACE EVALUATION WAS PERFORMED  Charlett Blake 07/21/2019, 7:56 AM

## 2019-07-21 NOTE — Plan of Care (Signed)
  Problem: Consults Goal: RH STROKE PATIENT EDUCATION Description: See Patient Education module for education specifics  Outcome: Progressing Goal: Nutrition Consult-if indicated Outcome: Progressing   Problem: RH BOWEL ELIMINATION Goal: RH STG MANAGE BOWEL WITH ASSISTANCE Description: STG Manage Bowel with min Assistance. Outcome: Progressing Goal: RH STG MANAGE BOWEL W/MEDICATION W/ASSISTANCE Description: STG Manage Bowel with Medication with min Assistance. Outcome: Progressing   Problem: RH BLADDER ELIMINATION Goal: RH STG MANAGE BLADDER WITH ASSISTANCE Description: STG Manage Bladder With min Assistance Outcome: Progressing   Problem: RH SKIN INTEGRITY Goal: RH STG MAINTAIN SKIN INTEGRITY WITH ASSISTANCE Description: STG Maintain Skin Integrity With min Assistance. Outcome: Progressing Goal: RH STG ABLE TO PERFORM INCISION/WOUND CARE W/ASSISTANCE Description: STG Able To Perform Wound Care With min Assistance. Outcome: Progressing   Problem: RH SAFETY Goal: RH STG ADHERE TO SAFETY PRECAUTIONS W/ASSISTANCE/DEVICE Description: STG Adhere to Safety Precautions With min Assistance and appropriate assistive Device. Outcome: Progressing   Problem: RH COGNITION-NURSING Goal: RH STG ANTICIPATES NEEDS/CALLS FOR ASSIST W/ASSIST/CUES Description: STG Anticipates Needs/Calls for Assist With min Assistance and verbal Cues. Outcome: Progressing   Problem: RH PAIN MANAGEMENT Goal: RH STG PAIN MANAGED AT OR BELOW PT'S PAIN GOAL Description: <3 on a 0-10 pain scale Outcome: Progressing   Problem: RH KNOWLEDGE DEFICIT Goal: RH STG INCREASE KNOWLEDGE OF DIABETES Description: Patient and caregiver will be able to demonstrate knowledge of diabetes medications, dietary restrictions, and follow care with the MD post discharge with min assist from staff. Outcome: Progressing Goal: RH STG INCREASE KNOWLEDGE OF HYPERTENSION Description: Patient and caregiver will demonstrate knowledge of  HTN medications, dietary restrictions, and follow up care with the MD post discharge with min assist from staff, Outcome: Progressing Goal: RH STG INCREASE KNOWLEDGE OF DYSPHAGIA/FLUID INTAKE Description: Patient and caregiver will increase knowledge on dysphagia diet, thickened liquids, and increased fluid intake with min assist from staff. Outcome: Progressing Goal: RH STG INCREASE KNOWLEGDE OF HYPERLIPIDEMIA Description: Patient and caregiver will demonstrate knowledge of HLD medications, dietary restrictions, and follow up care with the MD post discharge with min assist from staff. Outcome: Progressing Goal: RH STG INCREASE KNOWLEDGE OF STROKE PROPHYLAXIS Description: Patient and caregiver with demonstrate knowledge of medications used to prevent future strokes with min assist from staff. Outcome: Progressing

## 2019-07-22 ENCOUNTER — Inpatient Hospital Stay (HOSPITAL_COMMUNITY): Payer: 59

## 2019-07-22 ENCOUNTER — Inpatient Hospital Stay (HOSPITAL_COMMUNITY): Payer: 59 | Admitting: Speech Pathology

## 2019-07-22 ENCOUNTER — Inpatient Hospital Stay (HOSPITAL_COMMUNITY): Payer: 59 | Admitting: Physical Therapy

## 2019-07-22 NOTE — Progress Notes (Signed)
Called spoke to husband about patient progress.  We also discussed elevated liver enzymes specifically ALT.  Patient took Tylenol on occasion at home although mostly use ibuprofen for aches and pains.  She also enjoyed a glass of wine or a glass of rum at home in the evening.  We discussed that that may be responsible for her elevated ALT will recheck in a couple weeks.

## 2019-07-22 NOTE — Progress Notes (Signed)
[place] PHYSICAL MEDICINE & REHABILITATION PROGRESS NOTE  Subjective/Complaints:  Cont BM with OT  ROS: Unable to assess due to language  Objective: Vital Signs: Blood pressure 122/89, pulse 92, temperature 98 F (36.7 C), resp. rate 19, height '5\' 7"'$  (1.702 m), weight 57.7 kg, SpO2 99 %. No results found. Recent Labs    07/20/19 1018  WBC 9.1  HGB 14.6  HCT 45.0  PLT 228   Recent Labs    07/20/19 1018  NA 146*  K 4.1  CL 109  CO2 26  GLUCOSE 157*  BUN 27*  CREATININE 0.67  CALCIUM 10.1    Physical Exam: BP 122/89 (BP Location: Right Arm)   Pulse 92   Temp 98 F (36.7 C)   Resp 19   Ht '5\' 7"'$  (1.702 m)   Wt 57.7 kg   SpO2 99%   BMI 19.92 kg/m  Constitutional: No distress . Vital signs reviewed. HENT: Normocephalic.  Atraumatic. Eyes: EOMI. No discharge. Cardiovascular: No JVD. Respiratory: Normal effort.  No stridor. GI: Non-distended. Skin: Warm and dry.  Intact. Psych: Normal mood.  Normal behavior. Musc: No edema in extremities.  No tenderness in extremities. Neuro: Alert Nonverbal Global aphasia Exam limited due to lack of ability to follow commands, however seen spontaneously moving LUE and LLE appears to sense pain on right side, unchanged 0/5 RUE and 0/5 RLE Tone reduced on Right side  Skin: Skin is warm and dry.  Psychiatric:  Unable to assess due to mentation   Assessment/Plan: 1. Functional deficits secondary to left basal ganglia hypertensive hemorrhage which require 3+ hours per day of interdisciplinary therapy in a comprehensive inpatient rehab setting.  Physiatrist is providing close team supervision and 24 hour management of active medical problems listed below.  Physiatrist and rehab team continue to assess barriers to discharge/monitor patient progress toward functional and medical goals  Care Tool:  Bathing    Body parts bathed by patient: Face   Body parts bathed by helper: Right arm, Right lower leg, Left arm, Left  lower leg, Chest, Abdomen, Front perineal area, Buttocks, Right upper leg, Left upper leg     Bathing assist Assist Level: Total Assistance - Patient < 25%     Upper Body Dressing/Undressing Upper body dressing   What is the patient wearing?: Pull over shirt    Upper body assist Assist Level: Maximal Assistance - Patient 25 - 49%    Lower Body Dressing/Undressing Lower body dressing      What is the patient wearing?: Pants, Incontinence brief     Lower body assist Assist for lower body dressing: Dependent - Patient 0%     Toileting Toileting    Toileting assist Assist for toileting: Dependent - Patient 0%     Transfers Chair/bed transfer  Transfers assist  Chair/bed transfer activity did not occur: Safety/medical concerns  Chair/bed transfer assist level: 2 Helpers     Locomotion Ambulation   Ambulation assist   Ambulation activity did not occur: Safety/medical concerns          Walk 10 feet activity   Assist  Walk 10 feet activity did not occur: Safety/medical concerns        Walk 50 feet activity   Assist Walk 50 feet with 2 turns activity did not occur: Safety/medical concerns         Walk 150 feet activity   Assist Walk 150 feet activity did not occur: Safety/medical concerns         Walk  10 feet on uneven surface  activity   Assist Walk 10 feet on uneven surfaces activity did not occur: Safety/medical concerns         Wheelchair     Assist Will patient use wheelchair at discharge?: Yes Type of Wheelchair: Manual    Wheelchair assist level: Dependent - Patient 0%(TIS w/c.) Max wheelchair distance: 150 ft    Wheelchair 50 feet with 2 turns activity    Assist        Assist Level: Dependent - Patient 0%   Wheelchair 150 feet activity     Assist     Assist Level: Dependent - Patient 0%      Medical Problem List and Plan: 1.  Right side weakness and aphasia secondary to left basal ganglia  hypertensive hemorrhage  CIR PT, OT , SLP-Team conference today please see physician documentation under team conference tab, met with team face-to-face to discuss problems,progress, and goals. Formulized individual treatment plan based on medical history, underlying problem and comorbidities. 2.  Antithrombotics: -DVT/anticoagulation: Subcutaneous heparin initiated 07/14/2019.  Monitor for any bleeding episodes- change to enoxaparin to reduce freq of injection              -antiplatelet therapy: N/A 3. Pain Management: Tylenol as needed 4. Mood: Provide emotional support             -antipsychotic agents: N/A 5. Neuropsych: This patient is not capable of making decisions on her own behalf. 6. Skin/Wound Care: Routine skin checks 7. Fluids/Electrolytes/Nutrition: Routine in and outs.  CMP ordered for tomorrow.  8.  Hypertension.  Lisinopril 20 mg twice daily, Norvasc 10 mg daily.               Vitals:   07/21/19 2039 07/22/19 0523  BP: 104/70 122/89  Pulse: (!) 108 92  Resp: 18 19  Temp: 98.8 F (37.1 C) 98 F (36.7 C)  SpO2: 98% 99%  generally well controlled - mild tachy may be autonomic dysfunction related to [place] , improved this am  9.  Hypernatremia-likely due to hypertonic saline             Na 151 on 12/19, labs ordered for today  10.  Leukocytosis             WBCs 11.5 on 12/19, labs ordered for today , low grade temp 99 Ax no cough will check UA             Afebrile 11.  Transaminasemia- no recent tylenol use isolated to ALT, abd exam is normal - monitor , ? ETOH or Tylenol use prior to [place]  LOS: 4 days A FACE TO FACE EVALUATION WAS PERFORMED  Charlett Blake 07/22/2019, 8:33 AM

## 2019-07-22 NOTE — Progress Notes (Signed)
Physical Therapy Session Note  Patient Details  Name: Melanie Madden MRN: [id number] Date of Birth: [birth year]  Today's Date: 07/22/2019 PT Individual Time: 1030-1055 [id number] PT Individual Time Calculation (min): 25 min and 55 min   Short Term Goals: Week 1:  PT Short Term Goal 1 (Week 1): Pt will perform bed<>chair transfers with max assist +1 PT Short Term Goal 2 (Week 1): Pt will perform sit<>stand with max assist +1 and maintain midline orientation PT Short Term Goal 3 (Week 1): Pt will maintain static sitting balance with min assist x 5 minutes  Skilled Therapeutic Interventions/Progress Updates:  Session 1  Pt received sitting in WC at RN station. Pt transorted to hall out side therapy gym. Sit<>stand with max assist with + 2 for WC control and visual feedback from mirror. Pt noted to have solid brief with bowel and bladder. Transported back to room in WC> squat pivot transfer with max Assist + 2 for safety on the R. Rolling R and L x 2 each with max assist for initiation  Of movement from PT to doff/don brief and perform pericare.  Supine NMR for BLE flexion/extension in synergy. Pt able to initiate movement on the LLE, but no active movement on the R. Pt left in bed with call bell in reach and all needs met.    Session 2.   PT attempted to see pt for scheduled therapy, but asleep and unable to arouse. PT returned later in the day. Pt received supine in bed with NT present Rolling in bed with mod assist to initiate movement to perform peri care following incontinent bowel and bladder episode. Clothing management performed by PT with total A. Supine>sit with total A from the R side of the bed. Squat pivot transfer to [facility] with total A on the R. Sit<>stand in parallel bars with visual feed back from mirror with max-total A to attain standing. Pt able to tolerate up to 10 seconds in standing prior to returning to sitting impulsively. Sitting balance in TIS WC to perform Dynav[employer] 2 rings only  x 2 bouts. Pt able to locate and press 3 on the first bout and 18 on the second with max assist for initiation of movement and cues for visual scanning for all stimuli beyond midline. Pt returned to room and performed squat transfer to bed with total A. Sit>supine completed with max assist, PT spoke with pt's husband on the phone and provided him with updates on therapy for the day as well as informing him of visual deficits that are becoming more apparent with increased mobility. Pt left supine in bed with call bell in reach and all needs met. PT       Therapy Documentation Precautions:  Precautions Precautions: Fall, Other (comment) Precaution Comments: pusher, right hemiparesis Restrictions Weight Bearing Restrictions: No vital Signs: Therapy Vitals Pulse Rate: (!) 116 BP: 131/83 Patient Position (if appropriate): Sitting Pain:   FAces: none.    Therapy/Group: Individual Therapy  Lorie Phenix 07/22/2019, 11:04 AM

## 2019-07-22 NOTE — Patient Care Conference (Signed)
Inpatient RehabilitationTeam Conference and Plan of Care Update Date: 07/22/2019   Time: 10:15 AM   Patient Name: Melanie Madden      Medical Record Number: [id number]  Date of Birth: [birth year] Sex: Female         Room/Bed: 4W20C/4W20C-01 Payor Info: Payor: [employer] / Plan: Electrical engineer / Product Type: *No Product type* /    Admit Date/Time:  07/18/2019  2:02 PM  Primary Diagnosis:  ICH (intracerebral hemorrhage) [facility])  Patient Active Problem List   Diagnosis Date Noted  . Infarction of left basal ganglia ([place]) 07/18/2019  . Hypernatremia   . Leukocytosis   . Essential hypertension   . Global aphasia   . Cytotoxic brain edema ([place]) 07/10/2019  . ICH (intracerebral hemorrhage) ([place]) 07/09/2019  . Anxiety state 02/21/2015  . Cephalalgia 12/21/2014    Expected Discharge Date: Expected Discharge Date: 08/14/19  Team Members Present: Physician leading conference: Dr. Alysia Penna Social Worker Present: Ovidio Kin, LCSW Nurse Present: Ellison Carwin, LPN Case Manager: Karene Fry, RN PT Present: Barrie Folk, PT;Rosita Dechalus, PTA OT Present: Laverle Hobby, OT SLP Present: Stormy Fabian, SLP PPS Coordinator present : Gunnar Fusi, SLP     Current Status/Progress Goal Weekly Team Focus  Bowel/Bladder   incontinent of b/b; LBM: 12/22  continue to work towards tolieting  assist with tolieting need prn   Swallow/Nutrition/ Hydration   Max A, very implusive dys 2 and NTL  Min A  swallow strategies to slow rate and pocketing, thin trials   ADL's   Total A for self care tasks bedlevel, +2 for squat pivot transfers  Min A overall  NMR, functional cognition, functional transfers, balance, ADL retraining   Mobility   maxA rolling, maxA bed mobility, maxA x 2  (for safety) SB transfer to L, poor postural control, pushing  min-modA  sitting balance, cognitive remediation, d/c planning, transfers   Communication   Max A, vocalize "ah" only expressive > receptive  Mod A  vowel  imitation, yes/no response, comprehension via matching objects/pictures towards communicaton system   Safety/Cognition/ Behavioral Observations  Max A  Mod A  basic/functional problem solving, sustained attention   Pain   pt has global aphasia; no facial expression of pain  remain pain free  assess pain level QS and prn   Skin   skin intact  maintain skin integrity  assess skin QS and prn      *See Care Plan and progress notes for long and short-term goals.     Barriers to Discharge  Current Status/Progress Possible Resolutions Date Resolved   Nursing                  PT  Home environment access/layout  2-3 STE no rails              OT                  SLP                SW Decreased caregiver support Limited support husband            Discharge Planning/Teaching Needs:  Home with husband who will take  FMLA and then hire assist for wife, aware she will need 24 hr care at discharge. Somewhat overwhelmed with all that has happened recently. 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  RN - inc B/B, fall risk, impulsive, need timed toileting q2-3h per MD.  OT total A self care, UB dressing mod A, +2 squat pivot, breakdown on bottom, min A goals.  PT max bed, slideboard max +2 for safety, +2 stand, goals min a transfers, goals mod A sit to stand, S goals for w/c mobility.  SLP D2nectar, max A, communication goals mod A.  Husband to take FMLA, can hire help.  Therapy to call husband today to update him.   Revisions to Treatment Plan: N/A     Medical Summary Current Status: tachycardia, BP, skin breakdown buttocks    Barriers to Discharge: Other (comments);Wound care;Neurogenic Bowel & Bladder   Possible Resolutions to Barriers: aphasia , bowel and bladder incont, will start toileting program   Continued Need for Acute Rehabilitation Level of Care:  The patient requires daily medical management by a physician with specialized training in physical medicine and rehabilitation for the following reasons: Direction of a multidisciplinary physical rehabilitation program to maximize functional independence : Yes Medical management of patient stability for increased activity during participation in an intensive rehabilitation regime.: Yes Analysis of laboratory values and/or radiology reports with any subsequent need for medication adjustment and/or medical intervention. : Yes   I attest that I was present, lead the team conference, and concur with the assessment and plan of the team.   Retta Diones 07/22/2019, 5:31 PM  Team conference was held via web/ teleconference due to [place] - 19

## 2019-07-22 NOTE — Progress Notes (Signed)
Social Work Patient ID: Melanie Madden, female   DOB: Nov 17, 1965, 53 y.o.   MRN: [id number]  [place] with husband via telephone to discuss team conference goals min-mod level fo assist and target discharge date 1/15. He can see some of her progress when he is here in the evenings. He is hopeful this will continue. Will leave private duty list in the room for him and he will begin looking into this. He plans to take a FMLA when she first goes home and provide care. Made aware PT to call his this afternoon to give therapy update. He was very appreciative and is here every evening. If he needs to come in during the day just let him know and he will.

## 2019-07-22 NOTE — Progress Notes (Signed)
Occupational Therapy Session Note  Patient Details  Name: Melanie Madden MRN: [id number] Date of Birth: [birth year]  Today's Date: 07/22/2019 OT Individual Time: [id number] OT Individual Time Calculation (min): 69 min    Short Term Goals: Week 1:  OT Short Term Goal 1 (Week 1): Pt will tolerate sitting EOB/EOM for 5 minutes with no more than min A in prep for functional task OT Short Term Goal 2 (Week 1): Pt will attend to R UE/LE during bathing task with no more than min cuing. OT Short Term Goal 3 (Week 1): Pt will complete LB dressing max A sit>stand. OT Short Term Goal 4 (Week 1): Pt will consistently complete basic transfers with +1 assist in order to decrease caregiver burden.  Skilled Therapeutic Interventions/Progress Updates:    Pt received supine, having just finished breakfast with NT. No facial indications of pain throughout session. Pt completed rolling to the R with 1 vc for brief change. Incontinent BM and urine in brief. Pt pointing at red bottom, area of breakdown. Barrier cream and peri hygiene completed. New brief donned with total A, but pt making efforts to participate in covering herself. Pt able to roll R with CGA, L with max A. Shorts donned max A at bed level. Pt transitioned to EOB with max A. Pt with pushing tendencies R and posteriorly. Pt able to maintain sitting balance with min-mod A with RUE placed in weightbearing position. Pt required max HOH to wash UB with washcloth EOB. Pt demonstrated improvement with donning shirt EOB. Pt able to thread RUE and pull over head with mod tactile cueing. Several attempts at communication throughout session but pt only able to get "ah" out. Pt completed squat pivot transfer to the w/c with max +1 assist, +2 present for safety. Pt completed oral care with mod A overall. Pt bringing several items to mouth throughout session. Pt doing good job attending to R hand this session, bringing it to midline and holding with LUE often. Pt was taken  to the therapy gym where she completed dynavision for visual scanning past midline. Pt able to initiate pushing of buttons with initial tactile/visual cueing and then able to continue appropriately. Pt able to scan 1-2 in past midline to the R and L with only initiation cueing. Ring of buttons was expanded and pt still able to scan 4-5 in to the R and L. Pt very distractible and able to attend to task with mod cueing. Pt was returned to the nurses desk and left sitting up with (S) in the TIS w/c.   Therapy Documentation Precautions:  Precautions Precautions: Fall, Other (comment) Precaution Comments: pusher, right hemiparesis Restrictions Weight Bearing Restrictions: No  Therapy/Group: Individual Therapy  Curtis Sites 07/22/2019, 6:59 AM

## 2019-07-22 NOTE — Progress Notes (Signed)
SLP Cancellation Note  Patient Details Name: Melanie Madden MRN: [id number] DOB: 14-Jun-1966   Cancelled treatment:        Pt was not able to arouse and missed 30 minutes of skilled ST.                                                                                                Keegen Heffern 07/22/2019, 3:39 PM

## 2019-07-23 ENCOUNTER — Inpatient Hospital Stay (HOSPITAL_COMMUNITY): Payer: 59 | Admitting: *Deleted

## 2019-07-23 ENCOUNTER — Inpatient Hospital Stay (HOSPITAL_COMMUNITY): Payer: 59 | Admitting: Speech Pathology

## 2019-07-23 ENCOUNTER — Inpatient Hospital Stay (HOSPITAL_COMMUNITY): Payer: 59 | Admitting: Physical Therapy

## 2019-07-23 NOTE — Progress Notes (Signed)
Physical Therapy Session Note  Patient Details  Name: Melanie Madden MRN: [id number] Date of Birth: 1965-08-03  Today's Date: 07/23/2019 PT Individual Time: 1130-1200 PT Individual Time Calculation (min): 30 min   Short Term Goals: Week 1:  PT Short Term Goal 1 (Week 1): Pt will perform bed<>chair transfers with max assist +1 PT Short Term Goal 2 (Week 1): Pt will perform sit<>stand with max assist +1 and maintain midline orientation PT Short Term Goal 3 (Week 1): Pt will maintain static sitting balance with min assist x 5 minutes  Skilled Therapeutic Interventions/Progress Updates:  Pt presented in bed awake alert. PTA noted that pt incontinent. Pt performed rolling to R maxA and to L total A to perform peri care. PTA threaded pants total A and pt attempted partial bridge to pull pants over L hip however required total A to pull over R hip. Pt performed supine to sit at EOB maxA with PTA blocking pt from excessive forward flexion. Performed squat pivot to L maxA with PTA providing max multimodal cues for reaching for arm rest of TIS with L hand. Pt transported to rehab gym and participated with increased time x 2 STS at wall rail maxA with PTA providing max facilitation for hip extension, erect posture, and blocking R knee to promote wt shifting to RLE. Provided mirror feedback to encourage erect posture. Pt able to maintain standing for max ~30 sec before actively attempting to sit down. Pt remained in TIS at end of session with PTA placing full lap tray in place and leaving pt at nsg station for safety.       Therapy Documentation Precautions:  Precautions Precautions: Fall, Other (comment) Precaution Comments: pusher, right hemiparesis Restrictions Weight Bearing Restrictions: No General:   Vital Signs:   Pain: Pain Assessment Pain Scale: 0-10 Pain Score: 0-No pain Other Treatments:      Therapy/Group: Individual Therapy  Lars Jeziorski  Korissa Horsford, PTA  07/23/2019,  12:31 PM

## 2019-07-23 NOTE — Progress Notes (Addendum)
Speech Language Pathology Daily Session Note  Patient Details  Name: Melanie Madden MRN: [id number] Date of Birth: 06-21-1966  Today's Date: 07/23/2019 SLP Individual Time: 0915-1015  SLP Individual Time Calculation (min): 60 min  Short Term Goals: Week 1: SLP Short Term Goal 1 (Week 1): Pt will consume Dys2/nectar diet with min overt s/sx aspiration, efficient mastication and oral clearance with Max A multimodal cues. SLP Short Term Goal 2 (Week 1): Pt will consume trials of ice chips and/or thin liquids with Min overt s/sx aspiration X3 with Max A multimodal cues for use of slow rate strategy. SLP Short Term Goal 3 (Week 1): Pt will follow basic 1-step commands in 5/10 opportunities with Max A multimodal cues. SLP Short Term Goal 4 (Week 1): Pt will answer yes/no questions via multimodal means of communication with 50% accuracy provided Max A verbal/visual cues. SLP Short Term Goal 5 (Week 1): Pt will imitate vowel sounds with 50% accuracy given Max A multimodal cues. SLP Short Term Goal 6 (Week 1): Pt will sustain attention to tasks for 2 minute intervals with Max A multimodal cues for redirection.  Skilled Therapeutic Interventions:    Skilled treatment session focused on dysphagia, communication and cognition goals. SLP recieved pt in bed with NT feeding pt. SLP provided education to NT on how to facilitate pt feeding herself. Pt impulsively needs tactile cues to slow rate and to decrease bolus size. PT also requires tactile cues d/t restlessness to cease touching items on her tray and table. As such, recommend removing all items except for cup and plate. NT able to return demonstrate information. Pt able to clear oral residue on right when provided with hand over hand support to cease putting food in mouth. Pt not responsive to verbal or gestures/visual cues. No overt s/s of aspiration when consuming dysphagia 2 and nectar thick liquids rate than is faster than ideal At the end of session, SLP  provided skilled observation of pt consuming thin water via straw. Pt rapidly consumed 8 oz via straw without overt s/s of aspiration but during second consumption of 8 oz pt with cough. Additional trials were presented via cup sips. Pt with no overt s/s of aspiration when consuming via cup. Pt with occasional right anterior spillage. Would recommend allowing thin water via cup with meals. Even with impulsive fast rate, pt was free of overt s/s of aspiration. After consuming water, pt verbally approximately "ah that is the best."    Skilled treatment also focused on cognition and communication goals. SLP targeted sustained attention to task as well as probing recognition of numbers by having pt placed numbers sequentially in large calendar. After introduction of task, pt able to continue task to completion with no redirection to task. She also demonstrate knowledge of numerical sequence as she self-corrected placement of numbers x 2 to correct order. Pt unable to vocalize any numbers despite multimodal cues. Since pt appeared to recognize numbers, words were presented with common objects however pt didn't demonstrate any ability to read word and pair with object. PT left upright in bed, bed alarm on and all needs within reach. Continue per current plan of care.   Pain    Therapy/Group: Individual Therapy  Fedor Kazmierski 07/23/2019, 10:04 AM

## 2019-07-23 NOTE — Plan of Care (Signed)
  Problem: Consults Goal: RH STROKE PATIENT EDUCATION Description: See Patient Education module for education specifics  Outcome: Progressing Goal: Nutrition Consult-if indicated Outcome: Progressing   Problem: RH BOWEL ELIMINATION Goal: RH STG MANAGE BOWEL WITH ASSISTANCE Description: STG Manage Bowel with min Assistance. Outcome: Progressing Flowsheets (Taken 07/23/2019 1442) STG: Pt will manage bowels with assistance: 3-Moderate assistance Goal: RH STG MANAGE BOWEL W/MEDICATION W/ASSISTANCE Description: STG Manage Bowel with Medication with min Assistance. Outcome: Progressing Flowsheets (Taken 07/23/2019 1442) STG: Pt will manage bowels with medication with assistance: 3-Moderate assistance   Problem: RH BLADDER ELIMINATION Goal: RH STG MANAGE BLADDER WITH ASSISTANCE Description: STG Manage Bladder With min Assistance Outcome: Progressing Flowsheets (Taken 07/23/2019 1442) STG: Pt will manage bladder with assistance: 3-Moderate assistance   Problem: RH SKIN INTEGRITY Goal: RH STG MAINTAIN SKIN INTEGRITY WITH ASSISTANCE Description: STG Maintain Skin Integrity With min Assistance. Outcome: Progressing Flowsheets (Taken 07/23/2019 1442) STG: Maintain skin integrity with assistance: 3-Moderate assistance Goal: RH STG ABLE TO PERFORM INCISION/WOUND CARE W/ASSISTANCE Description: STG Able To Perform Wound Care With min Assistance. Outcome: Progressing Flowsheets (Taken 07/23/2019 1442) STG: Pt will be able to perform incision/wound care with assistance: 4-Minimal assistance   Problem: RH SAFETY Goal: RH STG ADHERE TO SAFETY PRECAUTIONS W/ASSISTANCE/DEVICE Description: STG Adhere to Safety Precautions With min Assistance and appropriate assistive Device. Outcome: Progressing Flowsheets (Taken 07/23/2019 1442) STG:Pt will adhere to safety precautions with assistance/device: 4-Minimal assistance   Problem: RH COGNITION-NURSING Goal: RH STG ANTICIPATES NEEDS/CALLS FOR ASSIST  W/ASSIST/CUES Description: STG Anticipates Needs/Calls for Assist With min Assistance and verbal Cues. Outcome: Progressing   Problem: RH PAIN MANAGEMENT Goal: RH STG PAIN MANAGED AT OR BELOW PT'S PAIN GOAL Description: <3 on a 0-10 pain scale Outcome: Progressing   Problem: RH KNOWLEDGE DEFICIT Goal: RH STG INCREASE KNOWLEDGE OF DIABETES Description: Patient and caregiver will be able to demonstrate knowledge of diabetes medications, dietary restrictions, and follow care with the MD post discharge with min assist from staff. Outcome: Progressing

## 2019-07-23 NOTE — Progress Notes (Signed)
Occupational Therapy Session Note  Patient Details  Name: Melanie Madden MRN: [id number] Date of Birth: 1965-12-12  Today's Date: 07/23/2019 OT Individual Time: 1330-1430 OT Individual Time Calculation (min): 60 min    Short Term Goals: Week 1:  OT Short Term Goal 1 (Week 1): Pt will tolerate sitting EOB/EOM for 5 minutes with no more than min A in prep for functional task OT Short Term Goal 2 (Week 1): Pt will attend to R UE/LE during bathing task with no more than min cuing. OT Short Term Goal 3 (Week 1): Pt will complete LB dressing max A sit>stand. OT Short Term Goal 4 (Week 1): Pt will consistently complete basic transfers with +1 assist in order to decrease caregiver burden.  Skilled Therapeutic Interventions/Progress Updates: patient requires +2 assist for moblization and balance due to great, great impulsivity and total lack of safety awreness per report.  Worked with her wheelchair level.  Cognitive remediation was utilized thoroughout the session as patient did not return demonstration or she exhibited motor apraxia and planning issues.    Required total assist hand over hand assist to use right hand as stabilizer ( I.e. during oral oral care to hold/squeeze paste) to hold items for self care.  After oral care, and using toothbrush, to brush teeth, patient placed comb in mouth rather than returning demonstration to comb hair.   She required max assist to use shampoo cap and comb hair.   This clinician assisted patient with hair braiding.    Participated partially in right upper extremity neuro reducation and range of motion.  And at times she pushed this clinician's hand away during the ROM and neuro re-education.  This clinician provided staff education to nursing tech on how to set up tray and only certain items at time as patient began to impulsively place foods unsafely into her mouth.  This clinician Suggested only allow patient small portions of water at time and to be careful she  does not impulsively throw her head backwards, opening up her air way.  Patient left in the hands of nursing assistant at the end of the session.  Continue OT Plan of Care     Therapy Documentation Precautions:  Precautions Precautions: Fall, Other (comment) Precaution Comments: pusher, right hemiparesis Restrictions Weight Bearing Restrictions: No Pain:not sure....patient nonverbal and with cognitive deficits   Therapy/Group: Individual Therapy  Herschell Dimes 07/23/2019, 2:37 PM

## 2019-07-23 NOTE — Progress Notes (Signed)
Physical Therapy Session Note  Patient Details  Name: Melanie Madden MRN: [id number] Date of Birth: 02/21/1966  Today's Date: 07/23/2019 PT Individual Time: 0800-0900 PT Individual Time Calculation (min): 60 min   Short Term Goals: Week 1:  PT Short Term Goal 1 (Week 1): Pt will perform bed<>chair transfers with max assist +1 PT Short Term Goal 2 (Week 1): Pt will perform sit<>stand with max assist +1 and maintain midline orientation PT Short Term Goal 3 (Week 1): Pt will maintain static sitting balance with min assist x 5 minutes  Skilled Therapeutic Interventions/Progress Updates:   Pt received supine in bed, and noted to be incontinent of bladder. Rolling to the R and supine with mod assist to initiate movement for PT for doff/don brief and perform peri care with pt in sidelying. PT applied moister barrier to affected skin. Sit>supine with total A on 2 attempts due to poor activation of R side trunk control. Sitting balance EOB with max assist to prevent multiple anterior/R LOB.  Donning pants at bed level with max assist using bridge, while PT stabilized the RLE. Squat pivot transfer to [facility] on the R with max-total A from PT. Pt transported to hall outside rehab gym.   Sit<>stand at rail with mod assist x 3 to achieve standing and max assist to maintain hip/trunk extension, force WB through the RLE, and sustain standing up to 30sec. PT attempted to assess orthostatic v/s, but unable with dinamap.   Gait training at rail in hall for RLE and trunkal NMR, 2 x 52f with +2assist  for WC follow and total A to facilitate advancement and knee/hip extension in stance on the R to prevent buckling. Pt able to maintain midline throughout when PT blocking RLE.   Pt returned to room and performed squat pivot transfer to bed with max assist. Sit>supine completed with max assist for RLE management, and left supine in bed with call bell in reach and all needs met.         Therapy  Documentation Precautions:  Precautions Precautions: Fall, Other (comment) Precaution Comments: pusher, right hemiparesis Restrictions Weight Bearing Restrictions: No Vital Signs: Therapy Vitals Temp: 97.7 F (36.5 C) Temp Source: Oral Pulse Rate: 89 Resp: 16 BP: 107/76 Patient Position (if appropriate): Lying Oxygen Therapy SpO2: 99 % O2 Device: Room Air Pain: Faces: none.    Therapy/Group: Individual Therapy  ALorie Phenix12/24/2020, 9:06 AM

## 2019-07-23 NOTE — Progress Notes (Signed)
Melanie Madden PHYSICAL MEDICINE & REHABILITATION PROGRESS NOTE  Subjective/Complaints:  Working on assisted amb with PT, RLE ACE wrapped, pt has Right sided field cut and   ROS: Unable to assess due to language  Objective: Vital Signs: Blood pressure 107/76, pulse 89, temperature 97.7 F (36.5 C), temperature source Oral, resp. rate 16, height 5\' 7"  (1.702 m), weight 57.7 kg, SpO2 99 %. No results found. Recent Labs    07/20/19 1018  WBC 9.1  HGB 14.6  HCT 45.0  PLT 228   Recent Labs    07/20/19 1018  NA 146*  K 4.1  CL 109  CO2 26  GLUCOSE 157*  BUN 27*  CREATININE 0.67  CALCIUM 10.1    Physical Exam: BP 107/76 (BP Location: Right Arm)   Pulse 89   Temp 97.7 F (36.5 C) (Oral)   Resp 16   Ht 5\' 7"  (1.702 m)   Wt 57.7 kg   SpO2 99%   BMI 19.92 kg/m  Constitutional: No distress . Vital signs reviewed. HENT: Normocephalic.  Atraumatic. Eyes: EOMI. No discharge. Cardiovascular: No JVD. Respiratory: Normal effort.  No stridor. GI: Non-distended. Skin: Warm and dry.  Intact. Psych: Normal mood.  Normal behavior. Musc: No edema in extremities.  No tenderness in extremities. Neuro: Alert Nonverbal Global aphasia Exam limited due to lack of ability to follow commands, however seen spontaneously moving LUE and LLE appears to sense pain on right side, unchanged 0/5 RUE and 0/5 RLE Tone reduced on Right side  Skin: Skin is warm and dry.  Psychiatric:  Unable to assess due to mentation   Assessment/Plan: 1. Functional deficits secondary to left basal ganglia hypertensive hemorrhage which require 3+ hours per day of interdisciplinary therapy in a comprehensive inpatient rehab setting.  Physiatrist is providing close team supervision and 24 hour management of active medical problems listed below.  Physiatrist and rehab team continue to assess barriers to discharge/monitor patient progress toward functional and medical goals  Care Tool:  Bathing    Body  parts bathed by patient: Face   Body parts bathed by helper: Right arm, Right lower leg, Left arm, Left lower leg, Chest, Abdomen, Front perineal area, Buttocks, Right upper leg, Left upper leg     Bathing assist Assist Level: Total Assistance - Patient < 25%     Upper Body Dressing/Undressing Upper body dressing   What is the patient wearing?: Pull over shirt    Upper body assist Assist Level: Maximal Assistance - Patient 25 - 49%    Lower Body Dressing/Undressing Lower body dressing      What is the patient wearing?: Pants, Incontinence brief     Lower body assist Assist for lower body dressing: Dependent - Patient 0%     Toileting Toileting    Toileting assist Assist for toileting: Dependent - Patient 0%     Transfers Chair/bed transfer  Transfers assist  Chair/bed transfer activity did not occur: Safety/medical concerns  Chair/bed transfer assist level: 2 Helpers     Locomotion Ambulation   Ambulation assist   Ambulation activity did not occur: Safety/medical concerns          Walk 10 feet activity   Assist  Walk 10 feet activity did not occur: Safety/medical concerns        Walk 50 feet activity   Assist Walk 50 feet with 2 turns activity did not occur: Safety/medical concerns         Walk 150 feet activity   Assist Walk  150 feet activity did not occur: Safety/medical concerns         Walk 10 feet on uneven surface  activity   Assist Walk 10 feet on uneven surfaces activity did not occur: Safety/medical concerns         Wheelchair     Assist Will patient use wheelchair at discharge?: Yes Type of Wheelchair: Manual    Wheelchair assist level: Dependent - Patient 0%(TIS w/c.) Max wheelchair distance: 150 ft    Wheelchair 50 feet with 2 turns activity    Assist        Assist Level: Dependent - Patient 0%   Wheelchair 150 feet activity     Assist     Assist Level: Dependent - Patient 0%       Medical Problem List and Plan: 1.  Right side weakness and aphasia secondary to left basal ganglia hypertensive hemorrhage  CIR PT, OT , SLP-ELOS 21-25d  2.  Antithrombotics: -DVT/anticoagulation: Subcutaneous heparin initiated 07/14/2019.  Monitor for any bleeding episodes- change to enoxaparin to reduce freq of injection              -antiplatelet therapy: N/A 3. Pain Management: Tylenol as needed 4. Mood: Provide emotional support             -antipsychotic agents: N/A 5. Neuropsych: This patient is not capable of making decisions on her own behalf. 6. Skin/Wound Care: Routine skin checks 7. Fluids/Electrolytes/Nutrition: Routine in and outs.  CMP ordered for tomorrow.  8.  Hypertension.  Lisinopril 20 mg twice daily, Norvasc 10 mg daily.               Vitals:   07/22/19 1932 07/23/19 0529  BP: 125/76 107/76  Pulse: (!) 107 89  Resp: 16 16  Temp:  97.7 F (36.5 C)  SpO2: 98% 99%  generally well controlled - mild tachy may be autonomic dysfunction related to [place] , improved this am  9.  Hypernatremia-likely due to hypertonic saline             Na 151 on 12/19, labs ordered for today  10.  Leukocytosis             WBCs 11.5 on 12/19, labs ordered for today , low grade temp 99 Ax no cough will check UA             Afebrile 11.  Transaminasemia- no recent tylenol use isolated to ALT, abd exam is normal - some  ETOH use prior to [place] 1-2 drinks per day  12.  Aphasia as per husband native language is Portugese, had a nurse on acute that spoke and pt seemed to respond better, discussed that apahsic pts may have better comprehension of native language vs languages learned later LOS: 5 days A FACE TO [place] E Alijah Hyde 07/23/2019, 8:51 AM

## 2019-07-24 MED ORDER — LISINOPRIL 10 MG PO TABS
10.0000 mg | ORAL_TABLET | Freq: Two times a day (BID) | ORAL | Status: DC
  Administered 2019-07-24 – 2019-07-29 (×8): 10 mg via ORAL
  Filled 2019-07-24 (×10): qty 1

## 2019-07-24 MED ORDER — AMLODIPINE BESYLATE 5 MG PO TABS
5.0000 mg | ORAL_TABLET | Freq: Every day | ORAL | Status: DC
  Administered 2019-07-25 – 2019-07-28 (×4): 5 mg via ORAL
  Filled 2019-07-24 (×4): qty 1

## 2019-07-24 NOTE — Progress Notes (Addendum)
[place] PHYSICAL MEDICINE & REHABILITATION PROGRESS NOTE  Subjective/Complaints:  Remains globally aphasic   ROS: Unable to assess due to language  Objective: Vital Signs: Blood pressure 106/80, pulse 94, temperature 98.2 F (36.8 C), resp. rate 16, height 5\' 7"  (1.702 m), weight 57.7 kg, SpO2 98 %. No results found. No results for input(s): WBC, HGB, HCT, PLT in the last 72 hours. No results for input(s): NA, K, CL, CO2, GLUCOSE, BUN, CREATININE, CALCIUM in the last 72 hours.  Physical Exam: BP 106/80 (BP Location: Right Arm)   Pulse 94   Temp 98.2 F (36.8 C)   Resp 16   Ht 5\' 7"  (1.702 m)   Wt 57.7 kg   SpO2 98%   BMI 19.92 kg/m  Constitutional: No distress . Vital signs reviewed. HENT: Normocephalic.  Atraumatic. Eyes: EOMI. No discharge. Cardiovascular: No JVD. Respiratory: Normal effort.  No stridor. GI: Non-distended. Skin: Warm and dry.  Intact. Psych: Normal mood.  Normal behavior. Musc: No edema in extremities.  No tenderness in extremities. Neuro: Alert Nonverbal Global aphasia, cannot follow simple verbal commands consistently  Exam limited due to lack of ability to follow commands, however seen spontaneously moving LUE and LLE appears to sense pain on right side, unchanged 0/5 RUE and 0/5 RLE Tone reduced on Right side  Skin: Skin is warm and dry.  Psychiatric:  Unable to assess due to mentation   Assessment/Plan: 1. Functional deficits secondary to left basal ganglia hypertensive hemorrhage which require 3+ hours per day of interdisciplinary therapy in a comprehensive inpatient rehab setting.  Physiatrist is providing close team supervision and 24 hour management of active medical problems listed below.  Physiatrist and rehab team continue to assess barriers to discharge/monitor patient progress toward functional and medical goals  Care Tool:  Bathing    Body parts bathed by patient: Face   Body parts bathed by helper: Right arm, Right  lower leg, Left arm, Left lower leg, Chest, Abdomen, Front perineal area, Buttocks, Right upper leg, Left upper leg     Bathing assist Assist Level: Total Assistance - Patient < 25%     Upper Body Dressing/Undressing Upper body dressing   What is the patient wearing?: Pull over shirt    Upper body assist Assist Level: Maximal Assistance - Patient 25 - 49%    Lower Body Dressing/Undressing Lower body dressing      What is the patient wearing?: Pants, Incontinence brief     Lower body assist Assist for lower body dressing: Dependent - Patient 0%     Toileting Toileting    Toileting assist Assist for toileting: Dependent - Patient 0%     Transfers Chair/bed transfer  Transfers assist  Chair/bed transfer activity did not occur: Safety/medical concerns  Chair/bed transfer assist level: Total Assistance - Patient < 25%     Locomotion Ambulation   Ambulation assist   Ambulation activity did not occur: Safety/medical concerns  Assist level: 2 helpers Assistive device: (rail in hall) Max distance: 12   Walk 10 feet activity   Assist  Walk 10 feet activity did not occur: Safety/medical concerns  Assist level: 2 helpers Assistive device: Other (comment)(rail in hall)   Walk 50 feet activity   Assist Walk 50 feet with 2 turns activity did not occur: Safety/medical concerns         Walk 150 feet activity   Assist Walk 150 feet activity did not occur: Safety/medical concerns         Walk 10  feet on uneven surface  activity   Assist Walk 10 feet on uneven surfaces activity did not occur: Safety/medical concerns         Wheelchair     Assist Will patient use wheelchair at discharge?: Yes Type of Wheelchair: Manual    Wheelchair assist level: Dependent - Patient 0% Max wheelchair distance: 150 ft    Wheelchair 50 feet with 2 turns activity    Assist        Assist Level: Dependent - Patient 0%   Wheelchair 150 feet activity      Assist     Assist Level: Dependent - Patient 0%      Medical Problem List and Plan: 1.  Right side weakness and aphasia secondary to left basal ganglia hypertensive hemorrhage  CIR PT, OT , SLP-ELOS 21-25d  2.  Antithrombotics: -DVT/anticoagulation: Subcutaneous heparin initiated 07/14/2019.  Monitor for any bleeding episodes- change to enoxaparin to reduce freq of injection - monitor PLT             -antiplatelet therapy: N/A  3. Pain Management: Tylenol as needed 4. Mood: Provide emotional support             -antipsychotic agents: N/A 5. Neuropsych: This patient is not capable of making decisions on her own behalf. 6. Skin/Wound Care: Routine skin checks 7. Fluids/Electrolytes/Nutrition: Routine in and outs.  CMP ordered for tomorrow.  8.  Hypertension.  Lisinopril 20 mg twice daily, Norvasc 10 mg daily.               Vitals:   07/23/19 1942 07/24/19 0547  BP: 118/67 106/80  Pulse: (!) 110 94  Resp: 18 16  Temp: 98.2 F (36.8 C)   SpO2: 97% 98%  generally well controlled - but asoft will reduce amlodipine to 5mg  and lisinopril to 10mg   9.  Hypernatremia-likely due to hypertonic saline             Na 151 on 12/19, l12/21 was 146, expect normalization over time  10.  Leukocytosis             WBCs 9.1 on 12/21, labs ordered for Monday  ,            Afebrile 11.  Transaminasemia- no recent tylenol use isolated to ALT, abd exam is normal - some  ETOH use prior to [place] 1-2 drinks per day  12.  Aphasia as per husband native language is Portugese, had a nurse on acute that spoke and pt seemed to respond better, discussed that apahsic pts may have better comprehension of native language vs languages learned later LOS: 6 days A FACE TO [place] E Roshad Hack 07/24/2019, 9:13 AM

## 2019-07-25 ENCOUNTER — Inpatient Hospital Stay (HOSPITAL_COMMUNITY): Payer: 59 | Admitting: Occupational Therapy

## 2019-07-25 ENCOUNTER — Inpatient Hospital Stay (HOSPITAL_COMMUNITY): Payer: 59 | Admitting: Speech Pathology

## 2019-07-25 ENCOUNTER — Inpatient Hospital Stay (HOSPITAL_COMMUNITY): Payer: 59 | Admitting: Physical Therapy

## 2019-07-25 LAB — URINALYSIS, ROUTINE W REFLEX MICROSCOPIC
Bilirubin Urine: NEGATIVE
Glucose, UA: NEGATIVE mg/dL
Hgb urine dipstick: NEGATIVE
Ketones, ur: NEGATIVE mg/dL
Nitrite: POSITIVE — AB
Protein, ur: NEGATIVE mg/dL
Specific Gravity, Urine: 1.013 (ref 1.005–1.030)
pH: 7 (ref 5.0–8.0)

## 2019-07-25 NOTE — Progress Notes (Signed)
Occupational Therapy Session Note  Patient Details  Name: Melanie Madden MRN: [id number] Date of Birth: 13-Jul-1966  Today's Date: 07/25/2019 OT Individual Time: 1107-1205 OT Individual Time Calculation (min): 58 min   Short Term Goals: Week 1:  OT Short Term Goal 1 (Week 1): Pt will tolerate sitting EOB/EOM for 5 minutes with no more than min A in prep for functional task OT Short Term Goal 2 (Week 1): Pt will attend to R UE/LE during bathing task with no more than min cuing. OT Short Term Goal 3 (Week 1): Pt will complete LB dressing max A sit>stand. OT Short Term Goal 4 (Week 1): Pt will consistently complete basic transfers with +1 assist in order to decrease caregiver burden.  Skilled Therapeutic Interventions/Progress Updates:    Pt greeted in bed with no s/s pain. Started with repositioning in bed due to pt slid down and leaning towards Lt side. Tried to have pt assist with boosting herself up by placing Lt hand on the headboard, however pt would just release grip. Total A for repositioning to obtain neutral alignment. Afterwards pt was placed in chair position for bathing tasks to address trunk control and upright tolerance. Max [facility] for washing upper and lower body. Pt had more difficultly with following 1 step verbal/tactile instructions than during session with this OT last week. Pt more resistant to [facility] for washing affected side today as well. Able to tolerate reclined figure 4 and doff sock halfway off foot once OT started her off. When +2 arrived, transitioned to dressing tasks, sit<stand from EOB using Stedy. Total A for supine<sit. Pt with Rt pushing and anterior lean as well as poor cervical control when sitting unsupported. Max A of 1 for balance while 2nd helper engaged pt in grooming, UB dressing, and LB dressing tasks. Sit<stand in [place] completed with 3 assist (1 helper stabilizing Stedy which was moving when locked). Total A of 2 for sit<stand to elevate pants over hips. She  completed another stand in this manner to position her closer to [facility] before transitioning to supine. +2 for sit<supine. Pt remained in bed with all needs, hemiplegic side supported, bed alarm set, and 4 bedrails up for safety. Tx focus placed on ADL retraining, sit<stands, balance, NMR, and praxis.    Therapy Documentation Precautions:  Precautions Precautions: Fall, Other (comment) Precaution Comments: pusher, right hemiparesis Restrictions Weight Bearing Restrictions: No   Pain: Pain Assessment Pain Scale: 0-10 Pain Score: 0-No pain ADL:       Therapy/Group: Individual Therapy  Mateya Torti A Syeda Prickett 07/25/2019, 12:44 PM

## 2019-07-25 NOTE — Progress Notes (Signed)
[place] PHYSICAL MEDICINE & REHABILITATION PROGRESS NOTE  Subjective/Complaints:  Remains globally aphasic   ROS: Unable to assess due to language  Objective: Vital Signs: Blood pressure 123/80, pulse 84, temperature 97.8 F (36.6 C), resp. rate 18, height 5\' 7"  (1.702 m), weight 57.7 kg, SpO2 100 %. No results found. No results for input(s): WBC, HGB, HCT, PLT in the last 72 hours. No results for input(s): NA, K, CL, CO2, GLUCOSE, BUN, CREATININE, CALCIUM in the last 72 hours.  Physical Exam: BP 123/80 (BP Location: Left Arm)   Pulse 84   Temp 97.8 F (36.6 C)   Resp 18   Ht 5\' 7"  (1.702 m)   Wt 57.7 kg   SpO2 100%   BMI 19.92 kg/m  Constitutional: No distress . Vital signs reviewed. HENT: Normocephalic.  Atraumatic. Eyes: EOMI. No discharge. Cardiovascular: No JVD. Respiratory: Normal effort.  No stridor. GI: Non-distended. Skin: Warm and dry.  Intact. Psych: Normal mood.  Normal behavior. Musc: No edema in extremities.  No tenderness in extremities. Neuro: Alert Nonverbal Global aphasia, cannot follow simple verbal commands consistently  Exam limited due to lack of ability to follow commands, however seen spontaneously moving LUE and LLE appears to sense pain on right side, unchanged 0/5 RUE and 0/5 RLE Tone reduced on Right side  Skin: Skin is warm and dry.  Psychiatric:  Unable to assess due to mentation   Assessment/Plan: 1. Functional deficits secondary to left basal ganglia hypertensive hemorrhage which require 3+ hours per day of interdisciplinary therapy in a comprehensive inpatient rehab setting.  Physiatrist is providing close team supervision and 24 hour management of active medical problems listed below.  Physiatrist and rehab team continue to assess barriers to discharge/monitor patient progress toward functional and medical goals  Care Tool:  Bathing    Body parts bathed by patient: Face   Body parts bathed by helper: Right arm, Right  lower leg, Left arm, Left lower leg, Chest, Abdomen, Front perineal area, Buttocks, Right upper leg, Left upper leg     Bathing assist Assist Level: Total Assistance - Patient < 25%     Upper Body Dressing/Undressing Upper body dressing   What is the patient wearing?: Pull over shirt    Upper body assist Assist Level: Maximal Assistance - Patient 25 - 49%    Lower Body Dressing/Undressing Lower body dressing      What is the patient wearing?: Pants, Incontinence brief     Lower body assist Assist for lower body dressing: Dependent - Patient 0%     Toileting Toileting    Toileting assist Assist for toileting: Dependent - Patient 0%     Transfers Chair/bed transfer  Transfers assist  Chair/bed transfer activity did not occur: Safety/medical concerns  Chair/bed transfer assist level: Total Assistance - Patient < 25%     Locomotion Ambulation   Ambulation assist   Ambulation activity did not occur: Safety/medical concerns  Assist level: 2 helpers Assistive device: (rail in hall) Max distance: 12   Walk 10 feet activity   Assist  Walk 10 feet activity did not occur: Safety/medical concerns  Assist level: 2 helpers Assistive device: Other (comment)(rail in hall)   Walk 50 feet activity   Assist Walk 50 feet with 2 turns activity did not occur: Safety/medical concerns         Walk 150 feet activity   Assist Walk 150 feet activity did not occur: Safety/medical concerns         Walk 10  feet on uneven surface  activity   Assist Walk 10 feet on uneven surfaces activity did not occur: Safety/medical concerns         Wheelchair     Assist Will patient use wheelchair at discharge?: Yes Type of Wheelchair: Manual    Wheelchair assist level: Dependent - Patient 0% Max wheelchair distance: 150 ft    Wheelchair 50 feet with 2 turns activity    Assist        Assist Level: Dependent - Patient 0%   Wheelchair 150 feet activity      Assist     Assist Level: Dependent - Patient 0%      Medical Problem List and Plan: 1.  Right side weakness and aphasia secondary to left basal ganglia hypertensive hemorrhage  CIR PT, OT , SLP-ELOS 21-25d  2.  Antithrombotics: -DVT/anticoagulation: Subcutaneous heparin initiated 07/14/2019.  Monitor for any bleeding episodes- change to enoxaparin to reduce freq of injection - monitor PLT             -antiplatelet therapy: N/A  3. Pain Management: Tylenol as needed 4. Mood: Provide emotional support             -antipsychotic agents: N/A 5. Neuropsych: This patient is not capable of making decisions on her own behalf. 6. Skin/Wound Care: Routine skin checks 7. Fluids/Electrolytes/Nutrition: Routine in and outs.  8.  Hypertension.  Lisinopril 20 mg twice daily, Norvasc 10 mg daily.               Vitals:   07/24/19 2031 07/25/19 0446  BP: 131/86 123/80  Pulse: 97 84  Resp: 16 18  Temp: 98.4 F (36.9 C) 97.8 F (36.6 C)  SpO2: 100% 100%  generally well controlled - but asoft will reduce amlodipine to 5mg  and lisinopril to 10mg .  12/26: well controlled. 9.  Hypernatremia-likely due to hypertonic saline             Na 151 on 12/19, l12/21 was 146, expect normalization over time  10.  Leukocytosis             WBCs 9.1 on 12/21, labs ordered for Monday  ,            Afebrile 11.  Transaminasemia- no recent tylenol use isolated to ALT, abd exam is normal - some  ETOH use prior to [place] 1-2 drinks per day  12.  Aphasia as per husband native language is Portugese, had a nurse on acute that spoke and pt seemed to respond better, discussed that apahsic pts may have better comprehension of native language vs languages learned later 13. 12/26 Cloudy urine: will order UA/UC LOS: 7 days A FACE TO FACE EVALUATION WAS PERFORMED  Martha Clan P Ailene Royal 07/25/2019, 12:15 PM

## 2019-07-25 NOTE — Progress Notes (Signed)
Speech Language Pathology Daily Session Note  Patient Details  Name: Mylynn Romanick MRN: [id number] Date of Birth: [birth year]  Today's Date: 07/25/2019 SLP Individual Time: 0803-0900 SLP Individual Time Calculation (min): 57 min  Short Term Goals: Week 1: SLP Short Term Goal 1 (Week 1): Pt will consume Dys2/nectar diet with min overt s/sx aspiration, efficient mastication and oral clearance with Max A multimodal cues. SLP Short Term Goal 2 (Week 1): Pt will consume trials of ice chips and/or thin liquids with Min overt s/sx aspiration X3 with Max A multimodal cues for use of slow rate strategy. SLP Short Term Goal 3 (Week 1): Pt will follow basic 1-step commands in 5/10 opportunities with Max A multimodal cues. SLP Short Term Goal 4 (Week 1): Pt will answer yes/no questions via multimodal means of communication with 50% accuracy provided Max A verbal/visual cues. SLP Short Term Goal 5 (Week 1): Pt will imitate vowel sounds with 50% accuracy given Max A multimodal cues. SLP Short Term Goal 6 (Week 1): Pt will sustain attention to tasks for 2 minute intervals with Max A multimodal cues for redirection.  Skilled Therapeutic Interventions:  Pt was seen for skilled ST targeting cognitive-linguistic and dysphagia goals.  Pt was awake and alert upon arrival.  She was repositioned in bed to maximize safety with PO intake.  Pt fed herself presentations of dys 2 textures and thin liquids with mod-max assist multimodal cues following tray set up deliberately targeted at minimizing visual distractions.   Pt did exhibit a rapid rate of intake but automaticity of oral phase and swallow initiation appeared appropriate.  Due to oral motor weakness pt did have some anterior spillage and slight pocketing of solids which cleared with increased time and max cues for use of a liquid wash.  No overt s/s of aspiration were evident with solids or liquids even when her natural, fast pace were allowed. Throughout her meal pt  was able to convey her preferences  to therapist via eye gaze or head nod when provided with a choice of two and max assist multimodal cues. After completion of meal, pt brushed her teeth with max cues for task initiation and sequencing.  SLP followed behind for thoroughness.  Pt was able to initiate and cease voicing of "ahh" on command during motor speech drills but was unable to vary her productions to include other vowel or consonant sounds due to motor planning deficits.   Pt was left in bed with bed alarm set and call bell within reach.  Continue per current plan of care.    Pain Pain Assessment Pain Scale: 0-10 Pain Score: 0-No pain  Therapy/Group: Individual Therapy  Mykaylah Ballman, Selinda Orion 07/25/2019, 10:11 AM

## 2019-07-25 NOTE — Progress Notes (Signed)
Physical Therapy Session Note  Patient Details  Name: Melanie Madden MRN: [id number] Date of Birth: [birth year]  Today's Date: 07/25/2019 PT Individual Time: 1415-1511 PT Individual Time Calculation (min): 56 min   Short Term Goals: Week 1:  PT Short Term Goal 1 (Week 1): Pt will perform bed<>chair transfers with max assist +1 PT Short Term Goal 2 (Week 1): Pt will perform sit<>stand with max assist +1 and maintain midline orientation PT Short Term Goal 3 (Week 1): Pt will maintain static sitting balance with min assist x 5 minutes  Skilled Therapeutic Interventions/Progress Updates:    Pt received sitting with HOB maximally elevated in bed and NT present assisting pt with meal. Therapist assumed care of patient to complete meal while focusing on R UE NMR and R attention. Therapist assisted pt with completing meal via hand-over-hand total assist using R UE to grasp spoon, pick up foot ,and bring to her mouth. Cuing throughout to prevent pocketing of food. Therapist placed meal tray slightly R of midline to increase R attention throughout task. Once completed meal, therapist performed oral care total assist. Supine>sit, HOB partially elevated, max assist for B LE management and trunk upright with cuing throughout for increased attention to task and increased pt participation. Sitting EOB pt continues to require mod/max assist for trunk control due to pusher tendencies causing R anterior LOB - performed sitting balance EOB for ~58minutes focusing on midline orientation with therapist providing max multimodal cuing and manual facilitation for L UE placement to decrease pushing with attention to center and maintaining static midline balance - attempted a reaching tasks focusing on L lateral weight shift to grasp object with L hand but pt only participated in 2 reaches prior to spontaneously returning to supine and pt appearing fatigued. With encouragement, pt agreeable to attempted sitting EOB trunk control  tasks again. Supine>sit with max/total assist for B LE management and trunk upright as pt reluctant to perform task. Sitting EOB pt continues to require mod/max assist for trunk control progressed to min assist when therapist positioned pt's L hand to the L outside of her BOS to prevent pushing tendencies; however, pt spontaneously bringing it back towards midline to increase her pushing requiring repositioning - throughout this task pt would grab onto therapist's arm to interfere with therapist repositioning her L hand and therapist continued to educate her on midline orientation and bringing her attention to LOBs that occur to instigate a protective response or righting reaction but this did not occur and resulted in therapist providing total assist to prevent R trunk LOB. Pt again spontaneously laying trunk down and brining L foot onto bed to return to supine. Required max assistance for repositioning in the bed - therapeutically positioned RUE and R LE support on pillows and pt left with needs in reach and bed alarm on.  Therapy Documentation Precautions:  Precautions Precautions: Fall, Other (comment) Precaution Comments: pusher, right hemiparesis Restrictions Weight Bearing Restrictions: No  Pain:   No indications of pain during session.    Therapy/Group: Individual Therapy  Tawana Scale, PT, DPT 07/25/2019, 2:12 PM

## 2019-07-26 MED ORDER — SULFAMETHOXAZOLE-TRIMETHOPRIM 400-80 MG PO TABS
1.0000 | ORAL_TABLET | Freq: Two times a day (BID) | ORAL | Status: AC
  Administered 2019-07-26 – 2019-08-02 (×14): 1 via ORAL
  Filled 2019-07-26 (×14): qty 1

## 2019-07-26 NOTE — Progress Notes (Signed)
Melanie Madden PHYSICAL MEDICINE & REHABILITATION PROGRESS NOTE  Subjective/Complaints:  Remains globally aphasic Smiles and makes good eye contact during communication. Blinks in response. Nonverbal.    ROS: Unable to assess due to language  Objective: Vital Signs: Blood pressure 120/79, pulse 98, temperature 98.8 F (37.1 C), resp. rate 16, height 5\' 7"  (1.702 m), weight 57.7 kg, SpO2 99 %. No results found. No results for input(s): WBC, HGB, HCT, PLT in the last 72 hours. No results for input(s): NA, K, CL, CO2, GLUCOSE, BUN, CREATININE, CALCIUM in the last 72 hours.  Physical Exam: BP 120/79 (BP Location: Left Arm)   Pulse 98   Temp 98.8 F (37.1 C)   Resp 16   Ht 5\' 7"  (1.702 m)   Wt 57.7 kg   SpO2 99%   BMI 19.92 kg/m  Constitutional: No distress . Vital signs reviewed. HENT: Normocephalic.  Atraumatic. Eyes: EOMI. No discharge. Cardiovascular: No JVD. Respiratory: Normal effort.  No stridor. GI: Non-distended. Skin: Warm and dry.  Intact. Psych: Normal mood.  Normal behavior. Musc: No edema in extremities.  No tenderness in extremities. Neuro: Alert Smiles and makes good eye contact during communication. Blinks in response Nonverbal Global aphasia, cannot follow simple verbal commands consistently  Exam limited due to lack of ability to follow commands, however seen spontaneously moving LUE and LLE appears to sense pain on right side, unchanged 0/5 RUE and 0/5 RLE Tone reduced on Right side  Skin: Skin is warm and dry.  Psychiatric:  Unable to assess due to mentation   Assessment/Plan: 1. Functional deficits secondary to left basal ganglia hypertensive hemorrhage which require 3+ hours per day of interdisciplinary therapy in a comprehensive inpatient rehab setting.  Physiatrist is providing close team supervision and 24 hour management of active medical problems listed below.  Physiatrist and rehab team continue to assess barriers to discharge/monitor patient  progress toward functional and medical goals  Care Tool:  Bathing    Body parts bathed by patient: Face   Body parts bathed by helper: Right arm, Right lower leg, Left arm, Left lower leg, Chest, Abdomen, Front perineal area, Buttocks, Right upper leg, Left upper leg     Bathing assist Assist Level: Total Assistance - Patient < 25%     Upper Body Dressing/Undressing Upper body dressing   What is the patient wearing?: Pull over shirt    Upper body assist Assist Level: 2 Helpers(EOB)    Lower Body Dressing/Undressing Lower body dressing      What is the patient wearing?: Pants, Incontinence brief     Lower body assist Assist for lower body dressing: 2 Helpers(2-3 helpers, sit<stand in [place])     Chartered loss adjuster assist Assist for toileting: Dependent - Patient 0%     Transfers Chair/bed transfer  Transfers assist  Chair/bed transfer activity did not occur: Safety/medical concerns  Chair/bed transfer assist level: Total Assistance - Patient < 25%     Locomotion Ambulation   Ambulation assist   Ambulation activity did not occur: Safety/medical concerns  Assist level: 2 helpers Assistive device: (rail in hall) Max distance: 12   Walk 10 feet activity   Assist  Walk 10 feet activity did not occur: Safety/medical concerns  Assist level: 2 helpers Assistive device: Other (comment)(rail in hall)   Walk 50 feet activity   Assist Walk 50 feet with 2 turns activity did not occur: Safety/medical concerns         Walk 150 feet activity  Assist Walk 150 feet activity did not occur: Safety/medical concerns         Walk 10 feet on uneven surface  activity   Assist Walk 10 feet on uneven surfaces activity did not occur: Safety/medical concerns         Wheelchair     Assist Will patient use wheelchair at discharge?: Yes Type of Wheelchair: Manual    Wheelchair assist level: Dependent - Patient 0% Max wheelchair  distance: 150 ft    Wheelchair 50 feet with 2 turns activity    Assist        Assist Level: Dependent - Patient 0%   Wheelchair 150 feet activity     Assist     Assist Level: Dependent - Patient 0%      Medical Problem List and Plan: 1.  Right side weakness and aphasia secondary to left basal ganglia hypertensive hemorrhage  CIR PT, OT , SLP-ELOS 21-25d  2.  Antithrombotics: -DVT/anticoagulation: Subcutaneous heparin initiated 07/14/2019.  Monitor for any bleeding episodes- change to enoxaparin to reduce freq of injection - monitor PLT             -antiplatelet therapy: N/A  3. Pain Management: Tylenol as needed 4. Mood: Provide emotional support             -antipsychotic agents: N/A 5. Neuropsych: This patient is not capable of making decisions on her own behalf. 6. Skin/Wound Care: Routine skin checks 7. Fluids/Electrolytes/Nutrition: Routine in and outs.  8.  Hypertension.  Lisinopril 20 mg twice daily, Norvasc 10 mg daily.               Vitals:   07/25/19 2023 07/26/19 0514  BP: 111/77 120/79  Pulse: 99 98  Resp: 19 16  Temp: (!) 97.5 F (36.4 C) 98.8 F (37.1 C)  SpO2: 100% 99%  generally well controlled - but asoft will reduce amlodipine to 5mg  and lisinopril to 10mg .  12/26: well controlled. 9.  Hypernatremia-likely due to hypertonic saline             Na 151 on 12/19, l12/21 was 146, expect normalization over time  10.  Leukocytosis             WBCs 9.1 on 12/21, labs ordered for Monday  ,            Afebrile 11.  Transaminasemia- no recent tylenol use isolated to ALT, abd exam is normal - some  ETOH use prior to [place] 1-2 drinks per day  12.  Aphasia as per husband native language is Portugese, had a nurse on acute that spoke and pt seemed to respond better, discussed that apahsic pts may have better comprehension of native language vs languages learned later 13. 12/26 Cloudy urine: will order UA/UC  12/27: UA cloudy with large leukocytes and positive  nitrites, many bacteria, 11-20 WBC. Started Bactrim.  LOS: 8 days A FACE TO FACE EVALUATION WAS PERFORMED  Clide Deutscher Khloie Hamada 07/26/2019, 2:35 PM

## 2019-07-27 ENCOUNTER — Inpatient Hospital Stay (HOSPITAL_COMMUNITY): Payer: 59

## 2019-07-27 ENCOUNTER — Inpatient Hospital Stay (HOSPITAL_COMMUNITY): Payer: 59 | Admitting: Occupational Therapy

## 2019-07-27 DIAGNOSIS — A499 Bacterial infection, unspecified: Secondary | ICD-10-CM

## 2019-07-27 DIAGNOSIS — R4701 Aphasia: Secondary | ICD-10-CM

## 2019-07-27 DIAGNOSIS — N390 Urinary tract infection, site not specified: Secondary | ICD-10-CM

## 2019-07-27 LAB — URINE CULTURE: Culture: >=100000 — AB

## 2019-07-27 LAB — CBC
HCT: 45.7 % (ref 36.0–46.0)
Hemoglobin: 15.1 g/dL — ABNORMAL HIGH (ref 12.0–15.0)
MCH: 30 pg (ref 26.0–34.0)
MCHC: 33 g/dL (ref 30.0–36.0)
MCV: 90.7 fL (ref 80.0–100.0)
Platelets: 253 10*3/uL (ref 150–400)
RBC: 5.04 MIL/uL (ref 3.87–5.11)
RDW: 13.1 % (ref 11.5–15.5)
WBC: 8.4 10*3/uL (ref 4.0–10.5)
nRBC: 0 % (ref 0.0–0.2)

## 2019-07-27 NOTE — Progress Notes (Signed)
Physical Therapy Weekly Progress Note  Patient Details  Name: Melanie Madden MRN: [id number] Date of Birth: [birth year]  Beginning of progress report period: July 19, 2019 End of progress report period: July 27, 2019  Today's Date: 07/27/2019  Patient has met 1 of 3 short term goals.  Pt is making slower than anticipated progress during current course of therapy. Pt's sitting balance varies between maxA to minA due to decreased attention and heavy pushing tendencies. Pt has progressed with transfers and has demonstrated a total A stand pivot transfer and maxA squat pivot transfer with +1 assist. Pt is also able to perform STS at wall rail maxA x 1 and is able to maintain midline orientation.   Patient continues to demonstrate the following deficits muscle weakness and muscle paralysis, impaired timing and sequencing, abnormal tone, unbalanced muscle activation, motor apraxia, ataxia, decreased coordination and decreased motor planning and decreased attention, decreased awareness, decreased problem solving and decreased safety awareness and therefore will continue to benefit from skilled PT intervention to increase functional independence with mobility.  Patient progressing toward long term goals..  Continue plan of care.  PT Short Term Goals Week 1:  PT Short Term Goal 1 (Week 1): Pt will perform bed<>chair transfers with max assist +1 PT Short Term Goal 1 - Progress (Week 1): Met PT Short Term Goal 2 (Week 1): Pt will perform sit<>stand with max assist +1 and maintain midline orientation PT Short Term Goal 2 - Progress (Week 1): Progressing toward goal PT Short Term Goal 3 (Week 1): Pt will maintain static sitting balance with min assist x 5 minutes PT Short Term Goal 3 - Progress (Week 1): Progressing toward goal Week 2:  PT Short Term Goal 1 (Week 2): Pt will ambulate 38f with max assist + 2 at rail for forced use of RLE. PT Short Term Goal 2 (Week 2): Pt will initiate WC  propulsion. PT Short Term Goal 3 (Week 2): Pt will maintain sitting balance EOB with min assist up to 3 minutes.      Therapy Documentation Precautions:  Precautions Precautions: Fall, Other (comment) Precaution Comments: pusher, right hemiparesis Restrictions Weight Bearing Restrictions: No    Vital Signs: Therapy Vitals Temp: 98.7 F (37.1 C) Temp Source: Oral Pulse Rate: 90 Resp: 16 BP: 106/72 Patient Position (if appropriate): Lying Oxygen Therapy SpO2: 100 % O2 Device: Room Air  Therapy/Group: Individual Therapy  Rosita DeChalus  Rosita DeChalus, PTA  07/27/2019, 2:52 PM

## 2019-07-27 NOTE — Progress Notes (Signed)
Incontinent of urine, requiring total care. Does not use call light, needs anticipated by staff. Tactile cues to slow down when drinking water. Melanie Madden A

## 2019-07-27 NOTE — Plan of Care (Signed)
  Problem: Consults Goal: RH STROKE PATIENT EDUCATION Description: See Patient Education module for education specifics  Outcome: Progressing Goal: Nutrition Consult-if indicated Outcome: Progressing   Problem: RH BOWEL ELIMINATION Goal: RH STG MANAGE BOWEL WITH ASSISTANCE Description: STG Manage Bowel with min Assistance. Outcome: Progressing Goal: RH STG MANAGE BOWEL W/MEDICATION W/ASSISTANCE Description: STG Manage Bowel with Medication with min Assistance. Outcome: Progressing   Problem: RH BLADDER ELIMINATION Goal: RH STG MANAGE BLADDER WITH ASSISTANCE Description: STG Manage Bladder With min Assistance Outcome: Progressing   Problem: RH SKIN INTEGRITY Goal: RH STG MAINTAIN SKIN INTEGRITY WITH ASSISTANCE Description: STG Maintain Skin Integrity With min Assistance. Outcome: Progressing Goal: RH STG ABLE TO PERFORM INCISION/WOUND CARE W/ASSISTANCE Description: STG Able To Perform Wound Care With min Assistance. Outcome: Progressing   Problem: RH SAFETY Goal: RH STG ADHERE TO SAFETY PRECAUTIONS W/ASSISTANCE/DEVICE Description: STG Adhere to Safety Precautions With min Assistance and appropriate assistive Device. Outcome: Progressing   Problem: RH COGNITION-NURSING Goal: RH STG ANTICIPATES NEEDS/CALLS FOR ASSIST W/ASSIST/CUES Description: STG Anticipates Needs/Calls for Assist With min Assistance and verbal Cues. Outcome: Progressing   Problem: RH PAIN MANAGEMENT Goal: RH STG PAIN MANAGED AT OR BELOW PT'S PAIN GOAL Description: <3 on a 0-10 pain scale Outcome: Progressing   Problem: RH KNOWLEDGE DEFICIT Goal: RH STG INCREASE KNOWLEDGE OF DIABETES Description: Patient and caregiver will be able to demonstrate knowledge of diabetes medications, dietary restrictions, and follow care with the MD post discharge with min assist from staff. Outcome: Progressing Goal: RH STG INCREASE KNOWLEDGE OF HYPERTENSION Description: Patient and caregiver will demonstrate knowledge of  HTN medications, dietary restrictions, and follow up care with the MD post discharge with min assist from staff, Outcome: Progressing Goal: RH STG INCREASE KNOWLEDGE OF DYSPHAGIA/FLUID INTAKE Description: Patient and caregiver will increase knowledge on dysphagia diet, thickened liquids, and increased fluid intake with min assist from staff. Outcome: Progressing Goal: RH STG INCREASE KNOWLEGDE OF HYPERLIPIDEMIA Description: Patient and caregiver will demonstrate knowledge of HLD medications, dietary restrictions, and follow up care with the MD post discharge with min assist from staff. Outcome: Progressing Goal: RH STG INCREASE KNOWLEDGE OF STROKE PROPHYLAXIS Description: Patient and caregiver with demonstrate knowledge of medications used to prevent future strokes with min assist from staff. Outcome: Progressing

## 2019-07-27 NOTE — Progress Notes (Signed)
[place] PHYSICAL MEDICINE & REHABILITATION PROGRESS NOTE  Subjective/Complaints:  No new issues pre RN, incontinent of urine  ROS: limited due to language/communication   Objective: Vital Signs: Blood pressure 90/66, pulse 78, temperature 97.8 F (36.6 C), resp. rate 17, height 5\' 7"  (1.702 m), weight 57.7 kg, SpO2 97 %. No results found. Recent Labs    07/27/19 0846  WBC 8.4  HGB 15.1*  HCT 45.7  PLT 253   No results for input(s): NA, K, CL, CO2, GLUCOSE, BUN, CREATININE, CALCIUM in the last 72 hours.  Physical Exam: BP 90/66 (BP Location: Left Arm)   Pulse 78   Temp 97.8 F (36.6 C)   Resp 17   Ht 5\' 7"  (1.702 m)   Wt 57.7 kg   SpO2 97%   BMI 19.92 kg/m  Constitutional: No distress . Vital signs reviewed. HEENT: EOMI, oral membranes moist Neck: supple Cardiovascular: RRR without murmur. No JVD    Respiratory: CTA Bilaterally without wheezes or rales. Normal effort    GI: BS +, non-tender, non-distended  Skin: Warm and dry.  Intact. Psych: Normal mood.  Normal behavior. Musc: No edema in extremities.  No tenderness in extremities. Neuro: Alert Smiles and makes good eye contact during communication. Blinks in response Globally aphasic, follows 25%+ simple commands Exam limited due to lack of ability to follow commands, however seen spontaneously moving LUE and LLE appears to sense pain on right side, unchanged 0/5 RUE. RLE tr to 1/5 HE, 0/5 distally Tone reduced on Right side  Skin: Skin is warm and dry.  Psychiatric:  flat  Assessment/Plan: 1. Functional deficits secondary to left basal ganglia hypertensive hemorrhage which require 3+ hours per day of interdisciplinary therapy in a comprehensive inpatient rehab setting.  Physiatrist is providing close team supervision and 24 hour management of active medical problems listed below.  Physiatrist and rehab team continue to assess barriers to discharge/monitor patient progress toward functional and medical  goals  Care Tool:  Bathing    Body parts bathed by patient: Face   Body parts bathed by helper: Right arm, Right lower leg, Left arm, Left lower leg, Chest, Abdomen, Front perineal area, Buttocks, Right upper leg, Left upper leg     Bathing assist Assist Level: Total Assistance - Patient < 25%     Upper Body Dressing/Undressing Upper body dressing   What is the patient wearing?: Pull over shirt    Upper body assist Assist Level: 2 Helpers(EOB)    Lower Body Dressing/Undressing Lower body dressing      What is the patient wearing?: Pants, Incontinence brief     Lower body assist Assist for lower body dressing: 2 Helpers(2-3 helpers, sit<stand in [place])     Chartered loss adjuster assist Assist for toileting: Dependent - Patient 0%     Transfers Chair/bed transfer  Transfers assist  Chair/bed transfer activity did not occur: Safety/medical concerns  Chair/bed transfer assist level: Total Assistance - Patient < 25%     Locomotion Ambulation   Ambulation assist   Ambulation activity did not occur: Safety/medical concerns  Assist level: 2 helpers Assistive device: (rail in hall) Max distance: 12   Walk 10 feet activity   Assist  Walk 10 feet activity did not occur: Safety/medical concerns  Assist level: 2 helpers Assistive device: Other (comment)(rail in hall)   Walk 50 feet activity   Assist Walk 50 feet with 2 turns activity did not occur: Safety/medical concerns  Walk 150 feet activity   Assist Walk 150 feet activity did not occur: Safety/medical concerns         Walk 10 feet on uneven surface  activity   Assist Walk 10 feet on uneven surfaces activity did not occur: Safety/medical concerns         Wheelchair     Assist Will patient use wheelchair at discharge?: Yes Type of Wheelchair: Manual    Wheelchair assist level: Dependent - Patient 0% Max wheelchair distance: 150 ft    Wheelchair 50 feet  with 2 turns activity    Assist        Assist Level: Dependent - Patient 0%   Wheelchair 150 feet activity     Assist     Assist Level: Dependent - Patient 0%      Medical Problem List and Plan: 1.  Right side weakness and aphasia secondary to left basal ganglia hypertensive hemorrhage  CIR PT, OT , SLP-ELOS 21-25d   2.  Antithrombotics: -DVT/anticoagulation: Subcutaneous heparin initiated 07/14/2019.  Monitor for any bleeding episodes- change to enoxaparin to reduce freq of injection - monitor PLT             -antiplatelet therapy: N/A  3. Pain Management: Tylenol as needed 4. Mood: Provide emotional support             -antipsychotic agents: N/A 5. Neuropsych: This patient is not capable of making decisions on her own behalf. 6. Skin/Wound Care: Routine skin checks 7. Fluids/Electrolytes/Nutrition: Routine in and outs.  8.  Hypertension.  Lisinopril 20 mg twice daily, Norvasc 10 mg daily.               Vitals:   07/26/19 1946 07/27/19 0534  BP: 112/70 90/66  Pulse: 93 78  Resp: 17 17  Temp: 98.8 F (37.1 C) 97.8 F (36.6 C)  SpO2: 99% 97%   12/28 Still soft on amlodipine 5mg  and lisinopril to 10mg .     -may need to reduce further, put hold parameters on lisinopril 9.  Hypernatremia-likely due to hypertonic saline             Na 151 on 12/19, l12/21 was 146, expect normalization over time  10.  Leukocytosis             WBCs 9.1 on 12/21, 8.4 on 12/28 11.  Transaminasemia- no recent tylenol use isolated to ALT, abd exam is normal - some  ETOH use prior to [place] 1-2 drinks per day  12.  Aphasia as per husband native language is Portugese, had a nurse on acute that spoke and pt seemed to respond better, discussed that apahsic pts may have better comprehension of native language vs languages learned later 78. E coli UTI: started on bactrim 12/21---continue for 7 days   LOS: 9 days A FACE TO FACE EVALUATION WAS PERFORMED  Meredith Staggers 07/27/2019, 11:02  AM

## 2019-07-27 NOTE — Progress Notes (Signed)
Occupational Therapy Weekly Progress Note  Patient Details  Name: Melanie Madden MRN: [id number] Date of Birth: 1966-06-21  Beginning of progress report period: 07/19/2019 End of progress report period: 07/27/2019  Today's Date: 07/27/2019 OT Individual Time: 7322-0254 and 1420-1515 OT Individual Time Calculation (min): 61 min and 55 min   Patient has met 0 of 3 short term goals.    Pt has been making slow progress towards LTGs. She continues to be limited functionally by profound cognitive deficits, Rt pushing tendencies, poor postural control, and dense Rt hemiplegia. Pt currently requires Total A of 1 or 2 for self care tasks completed bedlevel and EOB. She has also started completing sit<stands during dressing and BSC transfers using [place] lift to work on supported standing and postural control. Continue POC.    Patient continues to demonstrate the following deficits: muscle weakness and muscle paralysis, decreased cardiorespiratoy endurance, abnormal tone, unbalanced muscle activation, motor apraxia, decreased coordination and decreased motor planning, decreased midline orientation, decreased attention to right, right side neglect and ideational apraxia, decreased initiation, decreased attention, decreased awareness, decreased problem solving and decreased safety awareness and decreased sitting balance, decreased standing balance, decreased postural control and hemiplegia and therefore will continue to benefit from skilled OT intervention to enhance overall performance with BADL.  Patient progressing towards LTGs. Please see POC for details.   OT Short Term Goals Week 1:  OT Short Term Goal 1 (Week 1): Pt will tolerate sitting EOB/EOM for 5 minutes with no more than min A in prep for functional task OT Short Term Goal 1 - Progress (Week 1): Not met OT Short Term Goal 2 (Week 1): Pt will attend to R UE/LE during bathing task with no more than min cuing. OT Short Term Goal 2 - Progress  (Week 1): Not met OT Short Term Goal 3 (Week 1): Pt will complete LB dressing max A sit>stand. OT Short Term Goal 3 - Progress (Week 1): Not met OT Short Term Goal 4 (Week 1): Pt will consistently complete basic transfers with +1 assist in order to decrease caregiver burden. Week 2:  OT Short Term Goal 1 (Week 2): Pt will complete OOB toileting with 2 assist and LRAD OT Short Term Goal 2 (Week 2): Pt will complete 1 grooming task at the sink with Mod A OT Short Term Goal 3 (Week 2): Pt will exhibit improved Rt attention by positioning her affected UE safely during mobility with mod cuing  Skilled Therapeutic Interventions/Progress Updates:    Pt greeted in bed with no s/s pain. Total A for supine<sit and +2 for sit<stand in [place]. Pt with Rt pushing and poor postural control while in [place] needing 1 helper to support trunk while 2nd helper steered lift. While sitting upright in TIS, worked on ADL retraining, Rt attention, and upright tolerance while eating breakfast. Pt initially required HOH to scoop and bring food to mouth, progressing to initiating taking spoon from therapist when utensil tapped her hand. As tx progressed, pt then began to initiate scooping and bringing food to mouth with supervision. The plate was positioned towards Rt of midline. She scanned and scooped for food on right-most aspect of plate with mod tactile cues. Pt initiated turning head towards Rt and opening mouth when she wanted orange juice as OT managed beverages for her due to impulsivity. With time she also became more aware of food spillage from Rt side of mouth, wiping mouth. Manual cues needed for slowing rate of eating throughout session. HOH to brush  her teeth while sitting at the sink afterwards, OT managed cup of water to rinse however pt just swallowed it. At end of session pt returned to bed with [facility] and +2 assist. Pt remained in bed, repositioned for comfort to protect hemiplegic side. Left her with all needs  within reach, 4 bedrails up, and bed alarm set.  2nd Session 1:1 tx (55 min)  Pt greeted in bed with no s/s pain. Started session by retrieving needed DME and supplies for room in prep for toileting. Supine<sit completed with 2 assist. While EOB, pt with Rt pushing and forward lean needing Max A for balance while 2nd helper positioned Stedy. +2 assist for sit<stand in [place]. Provided manual, tactile, and verbal cuing to promote upright trunk and forward gaze however pt not receptive to this feedback. While sitting on BSC continued working on trunk control. Pt very squirmy and restless, needing calming cues and increased time to void. Pt eventually had continent BM. Used full-length mirror to promote increased postural awareness while standing in [place] during hygiene and brief change. Once pt was returned to bed with +2 assist, she completed handwashing and lotion application to work on Rt attention. Pt continues to need max cuing to attend to Rt hand. She was left in bed with all needs within reach, bed alarm set, and 4 bedrails up. Before session exit, pt smiled and high-fived OT's hand.   Therapy Documentation Precautions:  Precautions Precautions: Fall, Other (comment) Precaution Comments: pusher, right hemiparesis Restrictions Weight Bearing Restrictions: No Vital Signs: Therapy Vitals Temp: 97.8 F (36.6 C) Pulse Rate: 78 Resp: 17 BP: 90/66 Patient Position (if appropriate): Lying Oxygen Therapy SpO2: 97 % O2 Device: Room Air ADL:   :     Therapy/Group: Individual Therapy  Chrys Landgrebe A Jeffrie Stander 07/27/2019, 7:09 AM

## 2019-07-27 NOTE — Progress Notes (Signed)
Physical Therapy Session Note  Patient Details  Name: Melanie Madden MRN: [id number] Date of Birth: 1965/08/09  Today's Date: 07/27/2019 PT Individual Time: 1120-1200 PT Individual Time Calculation (min): 40 min   Short Term Goals: Week 1:  PT Short Term Goal 1 (Week 1): Pt will perform bed<>chair transfers with max assist +1 PT Short Term Goal 2 (Week 1): Pt will perform sit<>stand with max assist +1 and maintain midline orientation PT Short Term Goal 3 (Week 1): Pt will maintain static sitting balance with min assist x 5 minutes  Skilled Therapeutic Interventions/Progress Updates:  Pt presented in bed sleeping but easily aroused. Pt noted to have urinary incontinence. Performed rolling to R CGA, maxA to L to change brief and perform peri-care. Pt required increased time as pt constantly placing hand at groin once brief removed and making it difficult to move to place new brief. PTA also donned new gown maxA. Performed supine to sit maxA as pt was unable to consistently follow commands to use LUE to push up. Once sitting EOB set up SB and performed SB transfer to L maxA with pt able to intermittently follow commands for hand placement and to scoot (with chuck pad placed under pt's bottom to facilitate transfer. Pt sheets then changed and pt transferred back to bed. Pt pulling SB away when placed under pt. Initiated squat pivot transfer however pt pushed up significantly turning it into a stand pivot transfer maxA. Pt then participated in sitting balance at EOB for approx 5 min with pt varying from CGA to modA for static sitting balance. Pt transferred back to supine once activity finished with mod/maxA. Pt required cues for use of truncal control vs throwing self to bed and required total A for RLE management. Pt then attempted to scoot towards HOB pushing through LLE to perform mini bridge and LUE to push against bed rail. Performed +2 boost to [facility] to complete repositioning. Pt then positioned with  pillows and left with call bell within reach and bed alarm on,        Therapy Documentation Precautions:  Precautions Precautions: Fall, Other (comment) Precaution Comments: pusher, right hemiparesis Restrictions Weight Bearing Restrictions: No General:   Vital Signs: Therapy Vitals Temp: 98.7 F (37.1 C) Temp Source: Oral Pulse Rate: 90 Resp: 16 BP: 106/72 Patient Position (if appropriate): Lying Oxygen Therapy SpO2: 100 % O2 Device: Room Air    Therapy/Group: Individual Therapy  Lizvet Chunn  Dishawn Bhargava, PTA  07/27/2019, 2:49 PM

## 2019-07-27 NOTE — Progress Notes (Signed)
Speech Language Pathology Weekly Progress and Session Note  Patient Details  Name: Melanie Madden MRN: [id number] Date of Birth: [birth year]  Beginning of progress report period: July 20, 2019 End of progress report period: July 27, 2019  Today's Date: 07/27/2019 SLP Individual Time: 0930-1015 SLP Individual Time Calculation (min): 45 min  Short Term Goals: Week 1: SLP Short Term Goal 1 (Week 1): Pt will consume Dys2/nectar diet with min overt s/sx aspiration, efficient mastication and oral clearance with Max A multimodal cues. SLP Short Term Goal 1 - Progress (Week 1): Met SLP Short Term Goal 2 (Week 1): Pt will consume trials of ice chips and/or thin liquids with Min overt s/sx aspiration X3 with Max A multimodal cues for use of slow rate strategy. SLP Short Term Goal 2 - Progress (Week 1): Met SLP Short Term Goal 3 (Week 1): Pt will follow basic 1-step commands in 5/10 opportunities with Max A multimodal cues. SLP Short Term Goal 3 - Progress (Week 1): Met SLP Short Term Goal 4 (Week 1): Pt will answer yes/no questions via multimodal means of communication with 50% accuracy provided Max A verbal/visual cues. SLP Short Term Goal 4 - Progress (Week 1): Met SLP Short Term Goal 5 (Week 1): Pt will imitate vowel sounds with 50% accuracy given Max A multimodal cues. SLP Short Term Goal 5 - Progress (Week 1): Met SLP Short Term Goal 6 (Week 1): Pt will sustain attention to tasks for 2 minute intervals with Max A multimodal cues for redirection. SLP Short Term Goal 6 - Progress (Week 1): Not met    New Short Term Goals: Week 2: SLP Short Term Goal 1 (Week 2): Pt will consume Dys2/thin diet with min overt s/sx aspiration, efficient mastication, oral clearance and slow rate with Mod A multimodal cues. SLP Short Term Goal 2 (Week 2): Pt will consume Dys 3 trials with min s/s aspiration, efficient mastictaion and oral clearance with liquid wash with Max A verbal cues over 3 sessisons. SLP  Short Term Goal 3 (Week 2): Pt will express wants/needs via multimodal means (gestures, facial expression and communication board) with 60% accuracy with mod A verbal/visual cues. SLP Short Term Goal 4 (Week 2): Pt will imitate vowel sounds with 70% accuracy given Max A multimodal cues. SLP Short Term Goal 5 (Week 2): Pt will sustain attention to tasks for 2 minute intervals with Max A multimodal cues for redirection.  Weekly Progress Updates: Pt met 5 out 6 goals. Pt was upgraded to thin via cup at bedside from NTL, recommend to continue full supervision and NT educated on swallow strategies ( slow rate, reduce visual distractions on tray for self feeding, liquid wash and check for pocketing.) Pt demonstrated improvement in language skills at the end of the reporting period with ability to match object to photo, and object to lined drawling. Pt demonstrated ability to match object to action of lined drawling in 2 out 5 trials and object to word in 4 out 5 trials. Pt continued to demonstrate expression of yes/no via gestures inconsistently, with ability to express wants/needs via facial expressions with max A verbal cues. SLP recommends to ficus on communication system, vowel imitation and diet tolerance/solid upgrade in next reporting period. Pt would benefit from skilled ST services in order to maximize functional independence and reduce burden of care, likely requiring 24 hour supervision and continue ST services.     Intensity: Minumum of 1-2 x/day, 30 to 90 minutes Frequency: 3 to 5 out of  7 days Duration/Length of Stay: 1/15 Treatment/Interventions: Cognitive remediation/compensation;Cueing hierarchy;Dysphagia/aspiration precaution training;Functional tasks;Patient/family education;Therapeutic Activities;Speech/Language facilitation;Multimodal communication approach;Internal/external aids;Environmental controls   Daily Session  Skilled Therapeutic Interventions:  Skilled ST services focused on  swallow and language skills. Pt consumed thin via cup with max A verbal cues to slow rate, demonstrating x1 immediate cough with 6oz. Pt demonstrated ability to match color bean bags in a field of 2 with 80% accuracy given max A verbal/visual cues for sustained attention. SLP facilitated receptive language skills in object matching tasks, pt demonstrated abilty to match object to photo in 3 ou 3 trials, object to drawling in 3 out 3 trials, object to action drawling in 2 out 5 trials and object to word in 4 out 5 trials in a field of two with max A verbal/visual cues. SLP facilitated expressive language skills in vowl imitation tasks, pt demonstration ability imitate "oh"  In 6 out 10 trials and "uh" in 5 out 10 trials, shaped from familiar sound "ah."  Pt was left in room with call bell within reach and bed alarm set. ST recommends to continue skilled ST services.     General    Pain Pain Assessment Pain Score: 0-No pain  Therapy/Group: Individual Therapy  Melanie Madden  [facility] 07/27/2019, 3:59 PM

## 2019-07-28 ENCOUNTER — Inpatient Hospital Stay (HOSPITAL_COMMUNITY): Payer: 59 | Admitting: Occupational Therapy

## 2019-07-28 ENCOUNTER — Inpatient Hospital Stay (HOSPITAL_COMMUNITY): Payer: 59

## 2019-07-28 NOTE — Progress Notes (Signed)
Occupational Therapy Session Note  Patient Details  Name: Melanie Madden MRN: [id number] Date of Birth: Nov 19, 1965  Today's Date: 07/28/2019 OT Individual Time: [id number] OT Individual Time Calculation (min): 45 min  and Today's Date: 07/28/2019 OT Missed Time: 15 Minutes Missed Time Reason: Patient fatigue   Short Term Goals: Week 2:  OT Short Term Goal 1 (Week 2): Pt will complete OOB toileting with 2 assist and LRAD OT Short Term Goal 2 (Week 2): Pt will complete 1 grooming task at the sink with Mod A OT Short Term Goal 3 (Week 2): Pt will exhibit improved Rt attention by positioning her affected UE safely during mobility with mod cuing  Skilled Therapeutic Interventions/Progress Updates:    Pt received at RN station and appeared to be very restless. OT assisted pt back to room via wheelchair and pt standing into stedy with +2 assistance for safety. Pt very impulsive with movement, pushing, and limited trunk control during this session. Pt assisted onto toilet via stedy with +2 assistance. Brief was saturated with urine when removed. Pt lifting L LE off of stedy and attempting to pull pants off of LEs while seated on commode with total A needed for safety with balance. Pt able to stand from commode with +2 assist on second attempt secondary to fatigue. Pt  Leaning forward and bent at waist onto stedy bar secondary to fatigue. Pt assisted to bed via stedy and returning to bed with total +2 sit >supine for safety. Pt utilized suction toothbrush for oral care with mod A for thoroughness. OT provided pt with cup of water and min cuing for safety with swallowing. Pt closing eyes and very fatigued at this point. Pt missing 15 minutes of OT intervention. Bed alarm activated and call bell within reach.   Therapy Documentation Precautions:  Precautions Precautions: Fall, Other (comment) Precaution Comments: pusher, right hemiparesis Restrictions Weight Bearing Restrictions:  No General: General OT Amount of Missed Time: 15 Minutes   Therapy/Group: Individual Therapy  Gypsy Decant 07/28/2019, 5:31 PM

## 2019-07-28 NOTE — Progress Notes (Signed)
Physical Therapy Session Note  Patient Details  Name: Melanie Madden MRN: [id number] Date of Birth: [birth year]  Today's Date: 07/28/2019 PT Individual Time: [id number] and 1320-1435 PT Individual Time Calculation (min): 40 min and 75 min  Short Term Goals: Week 1:  PT Short Term Goal 1 (Week 1): Pt will perform bed<>chair transfers with max assist +1 PT Short Term Goal 1 - Progress (Week 1): Met PT Short Term Goal 2 (Week 1): Pt will perform sit<>stand with max assist +1 and maintain midline orientation PT Short Term Goal 2 - Progress (Week 1): Progressing toward goal PT Short Term Goal 3 (Week 1): Pt will maintain static sitting balance with min assist x 5 minutes PT Short Term Goal 3 - Progress (Week 1): Progressing toward goal Week 2:     Skilled Therapeutic Interventions/Progress Updates: Pt presented in bed agreeable to therapy. PTA donned pants total A. Performed supine to sit with modA and use of bed features. Performed SB transfer maxA to L into TIS with PTA providing HOH assist for L hand placement to facilitate transfer. Pt transported to hallway and performed STS x 3 with mirror feedback to maintain midline orientation. Pt required max cues to look forward vs turning head to L. On third stand attempted ambulation with PTA advancing RLE however required increased time and max facilitation to have pt advance LLE. Pt then became resistive to moving L hand on rail to improve stand. Pt then began pushing hips posteriorly to sit with PTA guiding pt to chair. Session ended with pt at nsg station with full lap tray in place.   Pt presented in bed noted to have urinary incontinence. Performed rolling L/R to remove brief maxA/minA respectively. PTA threaded pants total A with pt attempting to perform bridge to pull up pants but unable to clear buttocks fully. Performed supine to sit maxA with SB transfer to L maxA to TIS. Pt transported to ortho gym and attempted to participate in seated balance  activities to engage/maintain trunk control. PTA attempted to have pt reach for post it notes on wall in front of pt reaching with LUE. Pt ?unable to process vs refused to look at post it notes when reaching for paper. Pt was able to briefly initiate reaching however would pull hand back and reach back to mat. PTA performed HOH reaching to paper however pt still not looking at target despite max multimodal cues. Pt then attempted reaching for card onto mirror. Pt initially grasped first card but then unable to place onto board and resistive when attempting to perform HOH. Pt ulitmately transferred back to TIS via SB to R maxA with +2 for safety. After extended therapeutic rest pt transported to hallway and performed STS x 2 with mo/maxA. After second attempt attempted gait training with PTA fully advancing RLE and during this trial pt did attempt to take step with LLE x 2. Pt then actively trying to sit after second step. Pt transported back to room and performed SB transfer to bed. Pt noted to had have another episode of urinary incontinence. PTA changed brief in same manner as prior. Pt left in bed at end of session and left with bed alarm on, call bell within reach and needs met.      Therapy Documentation Precautions:  Precautions Precautions: Fall, Other (comment) Precaution Comments: pusher, right hemiparesis Restrictions Weight Bearing Restrictions: No General:   Vital Signs: Therapy Vitals Pulse Rate: 86 BP: 97/78 Patient Position (if appropriate): Sitting   Therapy/Group: Individual  Therapy  Melanie Madden  Melanie Madden, PTA  07/28/2019, 12:51 PM

## 2019-07-28 NOTE — Progress Notes (Signed)
Pt incontinent of B/B through out the night. Pt was able to drink the water with control, and not spilling it on her. Pt was able to squeeze this nurses hand indicating yes/no questions. Pt also was awake majority of the night, denies pain. No signs of distress noted at this time. Pt resting quietly in bed, unable to use call light. Staff continues to do hourly rounding on the pt to ensure needs are met. Marland Kitchen

## 2019-07-28 NOTE — Progress Notes (Signed)
PHYSICAL MEDICINE & REHABILITATION PROGRESS NOTE  Subjective/Complaints:  No new issues per RN, incontinent of urine. Aide is combing her hair at bedside.  ROS: limited due to language/communication   Objective: Vital Signs: Blood pressure 97/78, pulse 86, temperature (!) 97.5 F (36.4 C), temperature source Oral, resp. rate 16, height 5\' 7"  (1.702 m), weight 57.7 kg, SpO2 99 %. No results found. Recent Labs    07/27/19 0846  WBC 8.4  HGB 15.1*  HCT 45.7  PLT 253   No results for input(s): NA, K, CL, CO2, GLUCOSE, BUN, CREATININE, CALCIUM in the last 72 hours.  Physical Exam: BP 97/78 (BP Location: Left Arm)   Pulse 86   Temp (!) 97.5 F (36.4 C) (Oral)   Resp 16   Ht 5\' 7"  (1.702 m)   Wt 57.7 kg   SpO2 99%   BMI 19.92 kg/m  Constitutional: No distress . Vital signs reviewed. HEENT: EOMI, oral membranes moist Neck: supple Cardiovascular: RRR without murmur. No JVD    Respiratory: CTA Bilaterally without wheezes or rales. Normal effort    GI: BS +, non-tender, non-distended  Skin: Warm and dry.  Intact. Psych: Normal mood.  Normal behavior. Musc: No edema in extremities.  No tenderness in extremities. Neuro: Alert Smiles and makes good eye contact during communication. Blinks in response Globally aphasic, follows 25%+ simple commands Exam limited due to lack of ability to follow commands, however seen spontaneously moving LUE and LLE appears to sense pain on right side, unchanged 0/5 RUE. RLE tr to 1/5 HE, 0/5 distally Tone reduced on Right side  Skin: Skin is warm and dry.  Psychiatric:  flat  Assessment/Plan: 1. Functional deficits secondary to left basal ganglia hypertensive hemorrhage which require 3+ hours per day of interdisciplinary therapy in a comprehensive inpatient rehab setting.  Physiatrist is providing close team supervision and 24 hour management of active medical problems listed below.  Physiatrist and rehab team continue to  assess barriers to discharge/monitor patient progress toward functional and medical goals  Care Tool:  Bathing    Body parts bathed by patient: Face   Body parts bathed by helper: Right arm, Right lower leg, Left arm, Left lower leg, Chest, Abdomen, Front perineal area, Buttocks, Right upper leg, Left upper leg     Bathing assist Assist Level: Total Assistance - Patient < 25%     Upper Body Dressing/Undressing Upper body dressing   What is the patient wearing?: Pull over shirt    Upper body assist Assist Level: 2 Helpers(EOB)    Lower Body Dressing/Undressing Lower body dressing      What is the patient wearing?: Pants, Incontinence brief     Lower body assist Assist for lower body dressing: 2 Helpers(2-3 helpers, sit<stand in [place])     Chartered loss adjuster assist Assist for toileting: 2 Helpers     Transfers Chair/bed transfer  Transfers assist  Chair/bed transfer activity did not occur: Safety/medical concerns  Chair/bed transfer assist level: Total Assistance - Patient < 25%     Locomotion Ambulation   Ambulation assist   Ambulation activity did not occur: Safety/medical concerns  Assist level: 2 helpers Assistive device: (rail in hall) Max distance: 12   Walk 10 feet activity   Assist  Walk 10 feet activity did not occur: Safety/medical concerns  Assist level: 2 helpers Assistive device: Other (comment)(rail in hall)   Walk 50 feet activity   Assist Walk 50 feet with 2 turns activity  did not occur: Safety/medical concerns         Walk 150 feet activity   Assist Walk 150 feet activity did not occur: Safety/medical concerns         Walk 10 feet on uneven surface  activity   Assist Walk 10 feet on uneven surfaces activity did not occur: Safety/medical concerns         Wheelchair     Assist Will patient use wheelchair at discharge?: Yes Type of Wheelchair: Manual    Wheelchair assist level: Dependent -  Patient 0% Max wheelchair distance: 150 ft    Wheelchair 50 feet with 2 turns activity    Assist        Assist Level: Dependent - Patient 0%   Wheelchair 150 feet activity     Assist     Assist Level: Dependent - Patient 0%      Medical Problem List and Plan: 1.  Right side weakness and aphasia secondary to left basal ganglia hypertensive hemorrhage  CIR PT, OT , SLP-ELOS 21-25d   2.  Antithrombotics: -DVT/anticoagulation: Subcutaneous heparin initiated 07/14/2019.  Monitor for any bleeding episodes- change to enoxaparin to reduce freq of injection - monitor PLT             -antiplatelet therapy: N/A  3. Pain Management: Tylenol as needed 4. Mood: Provide emotional support             -antipsychotic agents: N/A 5. Neuropsych: This patient is not capable of making decisions on her own behalf. 6. Skin/Wound Care: Routine skin checks 7. Fluids/Electrolytes/Nutrition: Routine in and outs.  8.  Hypertension.  Lisinopril 20 mg twice daily, Norvasc 10 mg daily.               Vitals:   07/28/19 0553 07/28/19 0855  BP: 94/71 97/78  Pulse: 74 86  Resp:    Temp: (!) 97.5 F (36.4 C)   SpO2: 99%    12/28 Still soft on amlodipine 5mg  and lisinopril to 10mg .     -may need to reduce further, put hold parameters on lisinopril  12/29: BP still soft. Will stop amlodipine.  9.  Hypernatremia-likely due to hypertonic saline             Na 151 on 12/19, l12/21 was 146, expect normalization over time  10.  Leukocytosis             WBCs 9.1 on 12/21, 8.4 on 12/28 11.  Transaminasemia- no recent tylenol use isolated to ALT, abd exam is normal - some  ETOH use prior to [place] 1-2 drinks per day  12.  Aphasia as per husband native language is Portugese, had a nurse on acute that spoke and pt seemed to respond better, discussed that apahsic pts may have better comprehension of native language vs languages learned later 37. E coli UTI: started on bactrim 12/21---continue for 7  days   LOS: 10 days A FACE TO FACE EVALUATION WAS PERFORMED  Melanie Madden 07/28/2019, 9:13 AM

## 2019-07-29 ENCOUNTER — Inpatient Hospital Stay (HOSPITAL_COMMUNITY): Payer: 59

## 2019-07-29 ENCOUNTER — Inpatient Hospital Stay (HOSPITAL_COMMUNITY): Payer: 59 | Admitting: Occupational Therapy

## 2019-07-29 DIAGNOSIS — R1310 Dysphagia, unspecified: Secondary | ICD-10-CM

## 2019-07-29 MED ORDER — LISINOPRIL 10 MG PO TABS
10.0000 mg | ORAL_TABLET | Freq: Every day | ORAL | Status: DC
  Administered 2019-07-30: 10 mg via ORAL
  Filled 2019-07-29: qty 1

## 2019-07-29 NOTE — Progress Notes (Signed)
Speech Language Pathology Daily Session Note  Patient Details  Name: Melanie Madden MRN: [id number] Date of Birth: 08/12/1965  Today's Date: 07/29/2019 SLP Individual Time: [id number] SLP Individual Time Calculation (min): 55 min  Short Term Goals: Week 2: SLP Short Term Goal 1 (Week 2): Pt will consume Dys2/thin diet with min overt s/sx aspiration, efficient mastication, oral clearance and slow rate with Mod A multimodal cues. SLP Short Term Goal 2 (Week 2): Pt will consume Dys 3 trials with min s/s aspiration, efficient mastictaion and oral clearance with liquid wash with Max A verbal cues over 3 sessisons. SLP Short Term Goal 3 (Week 2): Pt will express wants/needs via multimodal means (gestures, facial expression and communication board) with 60% accuracy with mod A verbal/visual cues. SLP Short Term Goal 4 (Week 2): Pt will imitate vowel sounds with 70% accuracy given Max A multimodal cues. SLP Short Term Goal 5 (Week 2): Pt will sustain attention to tasks for 2 minute intervals with Max A multimodal cues for redirection.  Skilled Therapeutic Interventions: Skilled ST services focused on education, swallow and language skills. Pt's husband, Melanie Madden, present for ST session. SLP provided education on current communication progress and goals. Melanie Madden requested to provided supervision of meals in the evening while he visits, stating supervision was allowed on acute with intermittent supervision and verbalized swallow precautions/strategies with supervision A verbal cues. SLP facilitated supervision of dys 2 and thin water via cup, Melanie Madden demonstrated appropriate cuing for swallow strategies while feeding pt (checking for swallow prior to next bit and checking for pocketing) and hand over hand assistance when consuming thin via cup, remove cup for pt's grasp after each sip. Melanie Madden is signed off to provide supervision A with meals with intermittent nursing supervision to ensure safe swallow PO consumption and  therapy will address self-feeding of solids.   SLP assisted nurse with bed mobility in changing brief. Pt required max A verbal/tatcile cues for bed mobility but mod A verbal/tatcile cues to lift left hip and pull pants up. SLP facilitated receptive language skills in object matching in a field of two to photo action demonstrating 3/3 accuracy, to drawling action in 3/3 accuracy and matching drawling action in a field of two to word in 2 out 3 trials. Pt was unable to select pictures based on verbal instruction alone, however able to with written word and verbal instruction given 67% accuracy. Pt was left in room with husband call bell within reach and bed alarm set. ST recommends to continue skilled ST services.     Pain Pain Assessment Pain Score: 0-No pain  Therapy/Group: Individual Therapy  Melanie Madden  [facility] 07/29/2019, 4:30 PM

## 2019-07-29 NOTE — Progress Notes (Signed)
Social Work Patient ID: Melanie Madden, female   DOB: 10/06/1965, 53 y.o.   MRN: [id number]  [place] with husband via telephone to discus team conference progress toward her goals and target discharge still 1/15. Husband plans to come in and sit in with Speech therapy today and has some questions for her. [name] is aware and will look for him. He would also like to talk with PA-Dan have let Linna Hoff know he is coming. Encouraged him to begin the search for caregivers for pt, he plans to be with her at the beginning and then when he returns to work he will have caregivers with her. Made aware Lennart Pall will be taking over the case in my absence. He has her contact numbers.

## 2019-07-29 NOTE — Patient Care Conference (Signed)
Inpatient RehabilitationTeam Conference and Plan of Care Update Date: 07/29/2019   Time: 10:10 AM   Patient Name: Melanie Madden      Medical Record Number: [id number]  Date of Birth: December 31, 1965 Sex: Female         Room/Bed: 4W20C/4W20C-01 Payor Info: Payor: [employer] / Plan: Electrical engineer / Product Type: *No Product type* /    Admit Date/Time:  07/18/2019  2:02 PM  Primary Diagnosis:  Dysphagia  Patient Active Problem List   Diagnosis Date Noted  . Dysphagia 07/29/2019  . Infarction of left basal ganglia ([place]) 07/18/2019  . Hypernatremia   . Leukocytosis   . Essential hypertension   . Global aphasia   . 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 ICH (intracerebral hemorrhage) ([place]) 07/09/2019  . Anxiety state 02/21/2015  . Cephalalgia 12/21/2014    Expected Discharge Date: Expected Discharge Date: 08/14/19  Team Members Present: Physician leading conference: Dr. Leeroy Cha Social Worker Present: Ovidio Kin, LCSW Nurse Present: Judee Clara, LPN Case Manager: Karene Fry, RN PT Present: Leavy Cella, PT;Rosita Dechalus, PTA OT Present: Darleen Crocker, OT SLP Present: Charolett Bumpers, SLP PPS Coordinator present : Gunnar Fusi, SLP     Current Status/Progress Goal Weekly Team Focus  Bowel/Bladder   Pt incontinent of b/b, LBM 07/28/19  continue to work on regaining continence.  Q2h toilting   Swallow/Nutrition/ Hydration   dy2 and thin via cup, Max A  Min A  swallow strategies (slow rate and pocketing), tolerance of thin and dys 3 trials   ADL's   Total A self care tasks bedlevel, +2 for self care EOB and sit<stand in [place], +2 for [place] transfers  Downgraded to Mod A overall  NMR, functional cognition, functional transfers, balance, ADL retraining, praxis   Mobility   minA rolling to R, maxA rolling to L, maxA SB transfer, maxA squat pivot, continues to demosntrate maxA to minA trunk control due to decrease attention and pushing. MaxA STS at wall rail and max x 2 gait  at wall  min-modA  sitting balance, cognitive remediation, transfer, pre-gait   Communication   Max A vocalize some vowel (yay), increased receptive language matching object/picture/word  Mod A  working on communication system (picture/words), vowel imitation, and wants/needs   Safety/Cognition/ Behavioral Observations  Max A  Mod A  basic/functional problem solving, sustained attention   Pain   pt has glocal aphasia. No facial expression to indicate pain when repositioning the pt.  Pt will remain pain free  Assess pain qshift/ PRN   Skin   Skin intact, MASD to bottom due to heavy voinding w/incontinence. barrier cream applied to bottom.  skin will be free of breakdown/infection  Assess skin qshift/prn      *See Care Plan and progress notes for long and short-term goals.     Barriers to Discharge  Current Status/Progress Possible Resolutions Date Resolved   Nursing                  PT                    OT                  SLP                SW                Discharge Planning/Teaching Needs:  Husband has been here mostly in the evenigns, if needs to come in  will for therapies. Was going to wait until closer to [place] to do this. Has started process of hiring caregivers      Team Discussion: Non verbal, not following commands, hypotensive.  RN increased R foot drop, ? Needs a boot, understands writing, non verbal, incontinent.  OT max/tot UB, total LB, steady for transfers, needs timed toileting, goals mod A.  PT max bed, pushing, decreased attention, max sit to stand, couple steps max/tot A, max cues, downgrade goals to mod A.  SLP D2thins, max swallow, trials D3, will work on Data processing manager, cog max A, max A basic problem solving.  Husband is very committed.    Revisions to Treatment Plan: N/A     Medical Summary Current Status: Nonverbal, does smile in response to greeting, does not follow commands, not agitated, increased right foot drop, hypotensive Weekly Focus/Goal:  Continue safe weaning of BP medications, continue improvements in mobility and communication, patient response well to writing on board  Barriers to Discharge: Medical stability;Behavior;Incontinence;Home enviroment access/layout   Possible Resolutions to Barriers: PRAFO to right foot to prevent pressure ulcers, weaning down of BP medications   Continued Need for Acute Rehabilitation Level of Care: The patient requires daily medical management by a physician with specialized training in physical medicine and rehabilitation for the following reasons: Direction of a multidisciplinary physical rehabilitation program to maximize functional independence : Yes Medical management of patient stability for increased activity during participation in an intensive rehabilitation regime.: Yes Analysis of laboratory values and/or radiology reports with any subsequent need for medication adjustment and/or medical intervention. : Yes   I attest that I was present, lead the team conference, and concur with the assessment and plan of the team.   Jodell Cipro M 07/29/2019, 8:28 PM  Team conference was held via web/ teleconference due to [place] - 19

## 2019-07-29 NOTE — Progress Notes (Signed)
[facility] PHYSICAL MEDICINE & REHABILITATION PROGRESS NOTE  Subjective/Complaints:  No new issues, incontinent of urine. Smiles in response, not following commands.   ROS: limited due to language/communication   Objective: Vital Signs: Blood pressure 106/77, pulse 79, temperature 98.8 F (37.1 C), temperature source Oral, resp. rate 16, height 5\' 7"  (1.702 m), weight 57.7 kg, SpO2 (!) 85 %. No results found. Recent Labs    07/27/19 0846  WBC 8.4  HGB 15.1*  HCT 45.7  PLT 253   No results for input(s): NA, K, CL, CO2, GLUCOSE, BUN, CREATININE, CALCIUM in the last 72 hours.  Physical Exam: BP 106/77 (BP Location: Right Arm)   Pulse 79   Temp 98.8 F (37.1 C) (Oral)   Resp 16   Ht 5\' 7"  (1.702 m)   Wt 57.7 kg   SpO2 (!) 85%   BMI 19.92 kg/m  Constitutional: No distress . Vital signs reviewed. Working with PT.  HEENT: EOMI, oral membranes moist Neck: supple Cardiovascular: RRR without murmur. No JVD    Respiratory: CTA Bilaterally without wheezes or rales. Normal effort    GI: BS +, non-tender, non-distended  Skin: Warm and dry.  Intact. Psych: Normal mood.  Normal behavior. Musc: No edema in extremities.  No tenderness in extremities. Neuro: Alert Smiles and makes good eye contact during communication. Blinks in response Globally aphasic, follows 25%+ simple commands Exam limited due to lack of ability to follow commands, however seen spontaneously moving LUE and LLE appears to sense pain on right side, unchanged 0/5 RUE. RLE tr to 1/5 HE, 0/5 distally Tone reduced on Right side  Skin: Skin is warm and dry.  Psychiatric:  flat  Assessment/Plan: 1. Functional deficits secondary to left basal ganglia hypertensive hemorrhage which require 3+ hours per day of interdisciplinary therapy in a comprehensive inpatient rehab setting.  Physiatrist is providing close team supervision and 24 hour management of active medical problems listed below.  Physiatrist and rehab  team continue to assess barriers to discharge/monitor patient progress toward functional and medical goals  Care Tool:  Bathing    Body parts bathed by patient: Face   Body parts bathed by helper: Right arm, Right lower leg, Left arm, Left lower leg, Chest, Abdomen, Front perineal area, Buttocks, Right upper leg, Left upper leg     Bathing assist Assist Level: Total Assistance - Patient < 25%     Upper Body Dressing/Undressing Upper body dressing   What is the patient wearing?: Pull over shirt    Upper body assist Assist Level: 2 Helpers(EOB)    Lower Body Dressing/Undressing Lower body dressing      What is the patient wearing?: Pants, Incontinence brief     Lower body assist Assist for lower body dressing: 2 Helpers(2-3 helpers, sit<stand in [place])     Chartered loss adjuster assist Assist for toileting: 2 Helpers     Transfers Chair/bed transfer  Transfers assist  Chair/bed transfer activity did not occur: Safety/medical concerns  Chair/bed transfer assist level: Total Assistance - Patient < 25%     Locomotion Ambulation   Ambulation assist   Ambulation activity did not occur: Safety/medical concerns  Assist level: 2 helpers Assistive device: (rail in hall) Max distance: 12   Walk 10 feet activity   Assist  Walk 10 feet activity did not occur: Safety/medical concerns  Assist level: 2 helpers Assistive device: Other (comment)(rail in hall)   Walk 50 feet activity   Assist Walk 50 feet with 2  turns activity did not occur: Safety/medical concerns         Walk 150 feet activity   Assist Walk 150 feet activity did not occur: Safety/medical concerns         Walk 10 feet on uneven surface  activity   Assist Walk 10 feet on uneven surfaces activity did not occur: Safety/medical concerns         Wheelchair     Assist Will patient use wheelchair at discharge?: Yes Type of Wheelchair: Manual    Wheelchair assist  level: Dependent - Patient 0% Max wheelchair distance: 150 ft    Wheelchair 50 feet with 2 turns activity    Assist        Assist Level: Dependent - Patient 0%   Wheelchair 150 feet activity     Assist     Assist Level: Dependent - Patient 0%      Medical Problem List and Plan: 1.  Right side weakness and aphasia secondary to left basal ganglia hypertensive hemorrhage  CIR PT, OT , SLP-ELOS 21-25d   2.  Antithrombotics: -DVT/anticoagulation: Subcutaneous heparin initiated 07/14/2019.  Monitor for any bleeding episodes- change to enoxaparin to reduce freq of injection - monitor PLT             -antiplatelet therapy: N/A  3. Pain Management: Tylenol as needed 4. Mood: Provide emotional support             -antipsychotic agents: N/A 5. Neuropsych: This patient is not capable of making decisions on her own behalf. 6. Skin/Wound Care: Routine skin checks 7. Fluids/Electrolytes/Nutrition: Routine in and outs.  8.  Hypertension.  Lisinopril 20 mg twice daily, Norvasc 10 mg daily.               Vitals:   07/28/19 1950 07/29/19 0530  BP: 104/68 106/77  Pulse: 90 79  Resp: 18 16  Temp: 99.5 F (37.5 C) 98.8 F (37.1 C)  SpO2: 98% (!) 85%   12/28 Still soft on amlodipine 5mg  and lisinopril to 10mg .     -may need to reduce further, put hold parameters on lisinopril  12/29: BP still soft. Will stop amlodipine.   12/30: BP still soft, will decrease lisinopril.  9.  Hypernatremia-likely due to hypertonic saline             Na 151 on 12/19, l12/21 was 146, expect normalization over time  10.  Leukocytosis             WBCs 9.1 on 12/21, 8.4 on 12/28 11.  Transaminasemia- no recent tylenol use isolated to ALT, abd exam is normal - some  ETOH use prior to [place] 1-2 drinks per day  12.  Aphasia as per husband native language is Portugese, had a nurse on acute that spoke and pt seemed to respond better, discussed that apahsic pts may have better comprehension of native language  vs languages learned later 40. E coli UTI: started on bactrim 12/21---continue for 7 days   LOS: 11 days A FACE TO FACE EVALUATION WAS PERFORMED  Clide Deutscher Mette [place] 07/29/2019, 8:49 AM

## 2019-07-29 NOTE — Progress Notes (Signed)
Oral care was provided on the pt. Pt was able to read a sentence written on paper. Pt was able to squeeze this nurses hand to indicate understanding of the sentence written on the paper. Pt is resting in bed at this time.

## 2019-07-29 NOTE — Progress Notes (Signed)
Occupational Therapy Session Note  Patient Details  Name: Melanie Madden MRN: [id number] Date of Birth: 1965/11/04  Today's Date: 07/29/2019 OT Individual Time: 1030-1130 and 1205-1300 OT Individual Time Calculation (min): 60 min and 55 mins   Short Term Goals: Week 2:  OT Short Term Goal 1 (Week 2): Pt will complete OOB toileting with 2 assist and LRAD OT Short Term Goal 2 (Week 2): Pt will complete 1 grooming task at the sink with Mod A OT Short Term Goal 3 (Week 2): Pt will exhibit improved Rt attention by positioning her affected UE safely during mobility with mod cuing  Skilled Therapeutic Interventions/Progress Updates:    Session 1: Upon entering the room, pt supine in bed and is grabbing at brief and pants. OT attempting to check for incontinence but pt pushes therapist away and attempts to pull covers back over body. Pt finally agreeing to allow therapist to assist with increased time. Pt rolling to the R with min A and L with max A. Total A for hygiene. Pt grabbing wash cloth but does not use in functional way this session. OT placing B TED hose on pt as well. Sit >supine with total A to EOB. Max A +2 slide board transfer for safety into wheelchair. Pt's BP taken with results of 107/73. OT assisted pt via wheelchair to day room for R UE NMR. Each time therapist attempting to place R UE into position or range she grabbed hand away and placed in lap. Pt does not grimace or make any other expression that allows therapist to think she is in pain. Peg board with 5 different color pegs placed at midline and to R side. Pt asked to select or point to color as called out. She was unable to accurately complete that task but did pull pegs by reach across midline to obtain. Pt left at RN station for safety. Lap tray donned for safety and tilt in space wheelchair reclined. All needs within reach.   Session 2: Pt received at RN station and appears restless. OT assisted pt back to room via wheelchair for  lunch. OT set up tray with lunch plate at midline. Pt rotating plate with L UE and blinking/gesturing to food she wishes to eat. Pt does attempt to bring any small items or cup looking items to mouth. She is able to drink from cup with L UE but needing cuing for slow pace. She attempts to bring magic cup to mouth to "drink" from it vs using spoon. Therapist placing utensil in pt's hand several times and she brings to mouth. She only attempts to scoop food 1-2 times this session herself. OT sets up wheelchair for transfer back into bed and pt very impulsive with movement requiring total A for support at trunk. Total A transfer from wheelchair >bed towards the L side. Total A for sit >supine for safety. OT assisted pt oral care after meal with use of suction toothbrush. No pocketing noted this session. OT checking pt again for incontinence but she remains dry. Pt repositioned with total A and bed alarm activated. Call bell within reach.   Therapy Documentation Precautions:  Precautions Precautions: Fall, Other (comment) Precaution Comments: pusher, right hemiparesis Restrictions Weight Bearing Restrictions: No   Therapy/Group: Individual Therapy  Gypsy Decant 07/29/2019, 1:16 PM

## 2019-07-29 NOTE — Progress Notes (Signed)
Physical Therapy Session Note  Patient Details  Name: Melanie Madden MRN: [id number] Date of Birth: May 29, 1966  Today's Date: 07/29/2019 PT Individual Time: 0805-0900 PT Individual Time Calculation (min): 55 min   Short Term Goals: Week 1:  PT Short Term Goal 1 (Week 1): Pt will perform bed<>chair transfers with max assist +1 PT Short Term Goal 1 - Progress (Week 1): Met PT Short Term Goal 2 (Week 1): Pt will perform sit<>stand with max assist +1 and maintain midline orientation PT Short Term Goal 2 - Progress (Week 1): Progressing toward goal PT Short Term Goal 3 (Week 1): Pt will maintain static sitting balance with min assist x 5 minutes PT Short Term Goal 3 - Progress (Week 1): Progressing toward goal Week 2:     Skilled Therapeutic Interventions/Progress Updates: Pt presented in bed awake. Pt noted to be incontinent of bladder, performed rolling L/R to change brief and perform peri-care. PTA threaded pants total A and pt attempted to pull pants over L hip with bridge and with cues attempted to pull up on right but unable to clear buttock. Pt was more consistent in following cues to roll to perform bed mobility this session and performed supine to sit maxA with use of bed features. PTA donned shirt in sitting maxA with +2 for safety. Performed squat pivot transfer to L maxA. Pt transported to rehab gym and performed squat pivot to mat in same manner. Participated in STS via [place] and attempted sitting balance in [place] with mirror feedback. Pt inconsistently following commands to reach for beanbag or to release grip from crossbar. Pt was able to maintain static balance with minA for brief bouts with max encouragement. Pt was also able to perform STS from [facility] with minA and maintain standing balance in midline for approx 10 [place]. Pt returned to mat and performed squat pivot transfer to return to chair. Pt transported back to room in anticipation of leaving pt in TIS however pt becoming listless and  noted malodor. Pt then performed stand pivot maxA to be and transferred to supine maxA. Pt left in care of nsg for med administration and peri care.      Therapy Documentation Precautions:  Precautions Precautions: Fall, Other (comment) Precaution Comments: pusher, right hemiparesis Restrictions Weight Bearing Restrictions: No General:   Vital Signs: Therapy Vitals Pulse Rate: 93 BP: 107/76    Therapy/Group: Individual Therapy  Shailen Thielen  Tabbetha Kutscher, PTA  07/29/2019, 4:19 PM

## 2019-07-29 NOTE — Progress Notes (Signed)
Orthopedic Tech Progress Note Patient Details:  Melanie Madden 03-14-1966 [id number] Went to apply PRAFO BOOT to patient but she was having therapy at the moment. The therapist said they would apply it after she finish her session.  Ortho Devices Type of Ortho Device: Prafo boot/shoe Ortho Device/Splint Location: RLE Ortho Device/Splint Interventions: Other (comment)   Post Interventions Patient Tolerated: Other (comment) Instructions Provided: Other (comment)   [name] 07/29/2019, 11:12 AM

## 2019-07-30 ENCOUNTER — Inpatient Hospital Stay (HOSPITAL_COMMUNITY): Payer: 59

## 2019-07-30 ENCOUNTER — Inpatient Hospital Stay (HOSPITAL_COMMUNITY): Payer: 59 | Admitting: Occupational Therapy

## 2019-07-30 ENCOUNTER — Inpatient Hospital Stay (HOSPITAL_COMMUNITY): Payer: 59 | Admitting: Physical Therapy

## 2019-07-30 DIAGNOSIS — R1310 Dysphagia, unspecified: Secondary | ICD-10-CM

## 2019-07-30 NOTE — Progress Notes (Signed)
PHYSICAL MEDICINE & REHABILITATION PROGRESS NOTE  Subjective/Complaints: No new issues, incontinent of urine. Smiles in response, not following commands.  Makes incomprehensible vocalization.   ROS: limited due to language/communication   Objective: Vital Signs: Blood pressure 90/63, pulse 84, temperature 98.6 F (37 C), resp. rate 16, height 5\' 7"  (1.702 m), weight 57.7 kg, SpO2 98 %. No results found. No results for input(s): WBC, HGB, HCT, PLT in the last 72 hours. No results for input(s): NA, K, CL, CO2, GLUCOSE, BUN, CREATININE, CALCIUM in the last 72 hours.  Physical Exam: BP 90/63 (BP Location: Left Arm)   Pulse 84   Temp 98.6 F (37 C)   Resp 16   Ht 5\' 7"  (1.702 m)   Wt 57.7 kg   SpO2 98%   BMI 19.92 kg/m  Constitutional: No distress . Vital signs reviewed. Moving milk in front of her.  HEENT: EOMI, oral membranes moist Neck: supple Cardiovascular: RRR without murmur. No JVD    Respiratory: CTA Bilaterally without wheezes or rales. Normal effort    GI: BS +, non-tender, non-distended  Skin: Warm and dry.  Intact. Psych: Normal mood.  Normal behavior. Musc: No edema in extremities.  No tenderness in extremities. Neuro: Alert Smiles and makes good eye contact during communication. Blinks in response. Makes incomprehensible vocalization.  Globally aphasic, follows 25%+ simple commands Exam limited due to lack of ability to follow commands, however seen spontaneously moving LUE and LLE appears to sense pain on right side, unchanged 0/5 RUE. RLE tr to 1/5 HE, 0/5 distally Tone reduced on Right side  Skin: Skin is warm and dry.  Psychiatric:  flat  Assessment/Plan: 1. Functional deficits secondary to left basal ganglia hypertensive hemorrhage which require 3+ hours per day of interdisciplinary therapy in a comprehensive inpatient rehab setting.  Physiatrist is providing close team supervision and 24 hour management of active medical problems listed  below.  Physiatrist and rehab team continue to assess barriers to discharge/monitor patient progress toward functional and medical goals  Care Tool:  Bathing    Body parts bathed by patient: Face   Body parts bathed by helper: Right arm, Right lower leg, Left arm, Left lower leg, Chest, Abdomen, Front perineal area, Buttocks, Right upper leg, Left upper leg     Bathing assist Assist Level: Total Assistance - Patient < 25%     Upper Body Dressing/Undressing Upper body dressing   What is the patient wearing?: Pull over shirt    Upper body assist Assist Level: 2 Helpers(EOB)    Lower Body Dressing/Undressing Lower body dressing      What is the patient wearing?: Pants, Incontinence brief     Lower body assist Assist for lower body dressing: 2 Helpers(2-3 helpers, sit<stand in [place])     Chartered loss adjuster assist Assist for toileting: 2 Helpers     Transfers Chair/bed transfer  Transfers assist  Chair/bed transfer activity did not occur: Safety/medical concerns  Chair/bed transfer assist level: Total Assistance - Patient < 25%     Locomotion Ambulation   Ambulation assist   Ambulation activity did not occur: Safety/medical concerns  Assist level: 2 helpers Assistive device: (rail in hall) Max distance: 12   Walk 10 feet activity   Assist  Walk 10 feet activity did not occur: Safety/medical concerns  Assist level: 2 helpers Assistive device: Other (comment)(rail in hall)   Walk 50 feet activity   Assist Walk 50 feet with 2 turns activity did not  occur: Safety/medical concerns         Walk 150 feet activity   Assist Walk 150 feet activity did not occur: Safety/medical concerns         Walk 10 feet on uneven surface  activity   Assist Walk 10 feet on uneven surfaces activity did not occur: Safety/medical concerns         Wheelchair     Assist Will patient use wheelchair at discharge?: Yes Type of Wheelchair:  Manual    Wheelchair assist level: Dependent - Patient 0% Max wheelchair distance: 150 ft    Wheelchair 50 feet with 2 turns activity    Assist        Assist Level: Dependent - Patient 0%   Wheelchair 150 feet activity     Assist     Assist Level: Dependent - Patient 0%      Medical Problem List and Plan: 1.  Right side weakness and aphasia secondary to left basal ganglia hypertensive hemorrhage  CIR PT, OT , SLP-ELOS 21-25d   2.  Antithrombotics: -DVT/anticoagulation: Subcutaneous heparin initiated 07/14/2019.  Monitor for any bleeding episodes- change to enoxaparin to reduce freq of injection - monitor PLT             -antiplatelet therapy: N/A  3. Pain Management: Tylenol as needed 4. Mood: Provide emotional support             -antipsychotic agents: N/A 5. Neuropsych: This patient is not capable of making decisions on her own behalf. 6. Skin/Wound Care: Routine skin checks 7. Fluids/Electrolytes/Nutrition: Routine in and outs.  8.  Hypertension.  Lisinopril 20 mg twice daily, Norvasc 10 mg daily.               Vitals:   07/29/19 1951 07/30/19 0554  BP: 110/72 90/63  Pulse: 92 84  Resp: 18 16  Temp: 98.1 F (36.7 C) 98.6 F (37 C)  SpO2: 97% 98%   12/28 Still soft on amlodipine 5mg  and lisinopril to 10mg .     -may need to reduce further, put hold parameters on lisinopril  12/29: BP still soft. Will stop amlodipine.   12/30: BP still soft, will decrease lisinopril.   12/31: BP still soft; will stop Lisinopril 9.  Hypernatremia-likely due to hypertonic saline             Na 151 on 12/19, l12/21 was 146, expect normalization over time  10.  Leukocytosis             WBCs 9.1 on 12/21, 8.4 on 12/28 11.  Transaminasemia- no recent tylenol use isolated to ALT, abd exam is normal - some  ETOH use prior to [place] 1-2 drinks per day  12.  Aphasia as per husband native language is Portugese, had a nurse on acute that spoke and pt seemed to respond better,  discussed that apahsic pts may have better comprehension of native language vs languages learned later 40. E coli UTI: started on bactrim 12/21---continue for 7 days   LOS: 12 days A FACE TO FACE EVALUATION WAS PERFORMED  Clide Deutscher Zymeir Salminen 07/30/2019, 8:57 AM

## 2019-07-30 NOTE — Progress Notes (Signed)
Occupational Therapy Session Note  Patient Details  Name: Melanie Madden MRN: [id number] Date of Birth: [birth year]  Today's Date: 07/30/2019 OT Individual Time: [id number] OT Individual Time Calculation (min): 55 min    Short Term Goals: Week 2:  OT Short Term Goal 1 (Week 2): Pt will complete OOB toileting with 2 assist and LRAD OT Short Term Goal 2 (Week 2): Pt will complete 1 grooming task at the sink with Mod A OT Short Term Goal 3 (Week 2): Pt will exhibit improved Rt attention by positioning her affected UE safely during mobility with mod cuing  Skilled Therapeutic Interventions/Progress Updates:    Upon entering the room, pt supine in bed with no c/o, signs, or symptoms of pain. Pt initiating rolling with min A in bed for therapist to check brief for incontinence. Total A to don pants from supine position with attempting to pull over hip. Supine >sit with total A to EOB. Slide board transfer with total A and pt following commands to initiate scoot as well. Second person present to manage equipment and for safety due to pt impulsivity. Pt having to turn head to locate self care items needed to wash face and brush teeth. Pt needing assistance for thoroughness with suction toothbrush. OT placing lotion on R hand for her to attend to and apply but pt instead attempting to eat lotion off of arm and having to be stopped. Pt remained in wheelchair at [employer] and placed at midline with lower half of the board having targets pop up. Pt able to hit 19 targets within 5 minutes and pt needing 4x as much time to locate , reach across midline, and hit targets on R side of board. Pt assisted to RN station with lap tray donned and pt tilted for safety. All needed items within reach.   Therapy Documentation Precautions:  Precautions Precautions: Fall, Other (comment) Precaution Comments: pusher, right hemiparesis Restrictions Weight Bearing Restrictions: No    Therapy/Group: Individual  Therapy  Gypsy Decant 07/30/2019, 10:03 AM

## 2019-07-30 NOTE — Progress Notes (Signed)
Physical Therapy Session Note  Patient Details  Name: Melanie Madden MRN: [id number] Date of Birth: May 02, 1966  Today's Date: 07/30/2019 PT Individual Time: 1105-1200 PT Individual Time Calculation (min): 55 min   Short Term Goals: Week 2:  PT Short Term Goal 1 (Week 2): Pt will ambulate 87f with max assist + 2 at rail for forced use of RLE. PT Short Term Goal 2 (Week 2): Pt will initiate WC propulsion. PT Short Term Goal 3 (Week 2): Pt will maintain sitting balance EOB with min assist up to 3 minutes.  Skilled Therapeutic Interventions/Progress Updates:  Pt received sitting in WC and agreeable to PT. Pt noticeably agitated and pulling at tray table. Pt transported to room to check for incontinence. Pt performed sit>stand in stedy with mod assist. Pt noted to have incontinent bladder and then was noted to actively having bowel movement. Placed BSC under pt in stedy to complete BM. Pt able to void bowel and bladder additionally on toilet. PT performed peri care in standing in stedy. PT and PT tech required to maintain at least min-mod assist on Pt throughout toileting to prevent L lateral/Anterior LOB.   Pt then transported to rehab gym hallway. Sit<>stand x 2 at rail with mod assist to achieve standing. Gait training with WC follow 2 x 520fwith total A for RLE management, posture, cues for sequecning with visual feed banc from mirror. Kinetron 3x 2 min with max assist for attention to and activation of the RLE in reciprocal pattern and max cues for attention to task. [facility] and all needs met.          Therapy Documentation Precautions:  Precautions Precautions: Fall, Other (comment) Precaution Comments: pusher, right hemiparesis Restrictions Weight Bearing Restrictions: No Vital Signs: Therapy Vitals Temp: 97.7 F (36.5 C) Pulse Rate: 94 Resp: 18 BP: 101/71 Patient Position (if appropriate): Sitting Oxygen  Therapy SpO2: 100 % O2 Device: Room Air Pain: Pain Assessment Pain Score: 0-No pain    Therapy/Group: Individual Therapy  AuLorie Phenix2/31/2020, 4:07 PM

## 2019-07-30 NOTE — Progress Notes (Signed)
Pt tongue is coated white. This nurse has performed oral care HS x3 nights with suction tooth brush and sponge. Pt swallowed the mouth rinse when given. Pt slept well through out the night. No signs of distress noted at this time. Pt resting in bed, watching tv.

## 2019-07-30 NOTE — Progress Notes (Signed)
Speech Language Pathology Daily Session Note  Patient Details  Name: Bilan Ruffcorn MRN: [id number] Date of Birth: [birth year]  Today's Date: 07/30/2019 SLP Individual Time: [id number] SLP Individual Time Calculation (min): 62 min  Short Term Goals: Week 2: SLP Short Term Goal 1 (Week 2): Pt will consume Dys2/thin diet with min overt s/sx aspiration, efficient mastication, oral clearance and slow rate with Mod A multimodal cues. SLP Short Term Goal 2 (Week 2): Pt will consume Dys 3 trials with min s/s aspiration, efficient mastictaion and oral clearance with liquid wash with Max A verbal cues over 3 sessisons. SLP Short Term Goal 3 (Week 2): Pt will express wants/needs via multimodal means (gestures, facial expression and communication board) with 60% accuracy with mod A verbal/visual cues. SLP Short Term Goal 4 (Week 2): Pt will imitate vowel sounds with 70% accuracy given Max A multimodal cues. SLP Short Term Goal 5 (Week 2): Pt will sustain attention to tasks for 2 minute intervals with Max A multimodal cues for redirection.  Skilled Therapeutic Interventions:Skilled ST services focused on swallow and language skills. SLP facilitated receptive language skills with severe tasks, pt was unable to match object to photo, object to drawling or object to word in a field of two compared to pervious session. Pt did appear disengaged, rolling eyes and grabbing cards, possibly impacting performance.  Pt began vocalizing "ah" during receptive language task, therefore SLP switched to target expressive language skills. Pt demonstrated ability to repeat "oh" in 2 out 10 trials, unable to imitate "uh" or "e" despite max verbal/visual and tactile cues. Pt participated in automatic language task, following x3 trials of singing "happy birthday" pt vocalized 70% during song and produced "dah", "doh" and non speech vocalizations. Pt was unable to produce phonemes on commands, nor stimulable for /m/, /b/ or /d/ phonemes  with max multimodal cues. SLP provided PO consumption snack of dys 2 and thin via cup as well as ice chips, pt required initial max A tactile cues to slow and engage in self-feeding, however cues faded to mod verbal cues with pt's ability to continue self-feeding. Pt demonstrated sustained attention in 2 minute interval in functional task with max A verbal cues. Pt grabbed hand vocalied "ah" several times and appeared agitated as SLP was leaving, SLP asked if she needed to use the bathroom and pt blinked in response. SLP attempted to have pt select yes on yes/no board, however pt appeared more frustrated. SLP notified NT to try pt on bed pan, pt blinked in response. Pt was left in room with call bell within reach and bed alarm set. ST recommends to continue skilled ST services.      Pain Pain Assessment Pain Score: 0-No pain  Therapy/Group: Individual Therapy  Lashia Niese  [facility] 07/30/2019, 2:59 PM

## 2019-07-31 ENCOUNTER — Inpatient Hospital Stay (HOSPITAL_COMMUNITY): Payer: Self-pay | Admitting: Physical Therapy

## 2019-07-31 ENCOUNTER — Inpatient Hospital Stay (HOSPITAL_COMMUNITY): Payer: Self-pay | Admitting: Speech Pathology

## 2019-07-31 NOTE — Progress Notes (Signed)
Physical Therapy Session Note  Patient Details  Name: Melanie Madden MRN: [id number] Date of Birth: 1966/03/09  Today's Date: 07/31/2019 PT Individual Time: [id number] PT Individual Time Calculation (min): 54 min   Short Term Goals: Week 2:  PT Short Term Goal 1 (Week 2): Pt will ambulate 35ft with max assist + 2 at rail for forced use of RLE. PT Short Term Goal 2 (Week 2): Pt will initiate WC propulsion. PT Short Term Goal 3 (Week 2): Pt will maintain sitting balance EOB with min assist up to 3 minutes.  Skilled Therapeutic Interventions/Progress Updates:  Pt received in bed. Therapist provides total assist for donning shorts bed level with therapist providing max cuing for rolling L<>R but pt not following commands, but pt does attempt to assist pulling shorts over L hips. Pt requires +2 assist for R sidelying>sitting EOB and sit<>stand in stedy. Pt transferred bed<>w/c with stedy +2 assist with total assist for sitting balance as pt with A/P and R LOB with absent awareness. Once assisted to w/c pt found to be incontinent of urine so transferred sit<>stand to allow therapist to doff/don brief and don clean pants. Transported pt to gym via w/c dependent assist and pt transfers w/c<>mat table with stedy +2. While sitting EOM pt engages in L lateral leans and reaching for cups on L side to focus on midline orientation and attempt to correct pushing R with pt continuing to require up to total assist for sitting balance and pt with impulsivity with all movements. Pt engaged in kicking ball while sitting EOM but no active movement or attempts to use RLE noted. Pt assisted back to w/c and pt impulsively leans forward to touch sock requiring total assist to prevent LOB out of w/c. At end of session pt left in TIS w/c with lap tray donned & sitting at nurses station.  Therapy Documentation Precautions:  Precautions Precautions: Fall, Other (comment) Precaution Comments: pusher, right  hemiparesis Restrictions Weight Bearing Restrictions: No    Pain: No behaviors demonstrating pain.   Therapy/Group: Individual Therapy  Waunita Schooner 07/31/2019, 10:33 AM
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
## 2019-07-31 NOTE — Progress Notes (Signed)
Speech Language Pathology Daily Session Note  Patient Details  Name: Shayenne Donna MRN: [id number] Date of Birth: 10-04-1965  Today's Date: 07/31/2019 SLP Individual Time: 1130-1210 SLP Individual Time Calculation (min): 40 min  Short Term Goals: Week 2: SLP Short Term Goal 1 (Week 2): Pt will consume Dys2/thin diet with min overt s/sx aspiration, efficient mastication, oral clearance and slow rate with Mod A multimodal cues. SLP Short Term Goal 2 (Week 2): Pt will consume Dys 3 trials with min s/s aspiration, efficient mastictaion and oral clearance with liquid wash with Max A verbal cues over 3 sessisons. SLP Short Term Goal 3 (Week 2): Pt will express wants/needs via multimodal means (gestures, facial expression and communication board) with 60% accuracy with mod A verbal/visual cues. SLP Short Term Goal 4 (Week 2): Pt will imitate vowel sounds with 70% accuracy given Max A multimodal cues. SLP Short Term Goal 5 (Week 2): Pt will sustain attention to tasks for 2 minute intervals with Max A multimodal cues for redirection.  Skilled Therapeutic Interventions: Skilled treatment session focused on communication goals. SLP facilitated session by providing Max A multimodal cues for patient to produce the phoneme /m/ on command. Patient was able to reproduce the phoneme /m/ X 4 repetitions in a row with max encouragement. Throughout session, patient appeared frustrated and was constantly closing her eyes, turning her head away from SLP and hitting her hand on her lab tray. SLP provided education and rationale in regards to need for current communication goals and made a "deal" with the patient that if she could produce the 2 individual phonemes /ah/ and /m/ X 4, treatment could end early. With extra time and Max verbal and visual cues, patient able to produce. Therefore, session ended 20 minutes early due to fatigue and frustration. Patient left upright in tilt-in-space wheelchair at RN station. Continue  with current plan of care.      Pain No indications of pain   Therapy/Group: Individual Therapy  Mertha Clyatt 07/31/2019, 12:14 PM
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
## 2019-08-01 ENCOUNTER — Inpatient Hospital Stay (HOSPITAL_COMMUNITY): Payer: Managed Care, Other (non HMO) | Admitting: Speech Pathology

## 2019-08-01 ENCOUNTER — Inpatient Hospital Stay (HOSPITAL_COMMUNITY): Payer: Managed Care, Other (non HMO) | Admitting: Occupational Therapy

## 2019-08-01 DIAGNOSIS — I639 Cerebral infarction, unspecified: Secondary | ICD-10-CM

## 2019-08-01 NOTE — Progress Notes (Signed)
[facility] PHYSICAL MEDICINE & REHABILITATION PROGRESS NOTE  Subjective/Complaints:   Pt did not respond in any verbalization/vocalizations this AM- no speech or heard sounds.  Was blinking at me a lot - not sure if trying to respond via blinks.   ROS: limited due to language/communication   Objective: Vital Signs: Blood pressure 120/83, pulse 80, temperature 98.1 F (36.7 C), resp. rate 18, height 5\' 7"  (1.702 m), weight 57.7 kg, SpO2 98 %. No results found. No results for input(s): WBC, HGB, HCT, PLT in the last 72 hours. No results for input(s): NA, K, CL, CO2, GLUCOSE, BUN, CREATININE, CALCIUM in the last 72 hours.  Physical Exam: BP 120/83 (BP Location: Left Arm)   Pulse 80   Temp 98.1 F (36.7 C)   Resp 18   Ht 5\' 7"  (1.702 m)   Wt 57.7 kg   SpO2 98%   BMI 19.92 kg/m  Constitutional: No distress . Vital signs reviewed. Lying in bed calmly.  HEENT: EOMI, oral membranes moist Neck: supple Cardiovascular: RRR without murmur. No JVD    Respiratory: CTA Bilaterally without wheezes or rales. Normal effort    GI: BS +, non-tender, non-distended  Skin: Warm and dry.  Intact. Musc: No edema in extremities.  No tenderness in extremities. Neuro: Alert Smiles and makes good eye contact during communication. Blinks in response.  Globally aphasic, follows ~25% simple commands Exam limited due to lack of ability to follow commands, however seen spontaneously moving LUE and LLE appears to sense pain on right side, unchanged 0/5 RUE. RLE tr to 1/5 HE, 0/5 distally Tone reduced on Right side  Skin: Skin is warm and dry.  Psychiatric:  flat  Assessment/Plan: 1. Functional deficits secondary to left basal ganglia hypertensive hemorrhage which require 3+ hours per day of interdisciplinary therapy in a comprehensive inpatient rehab setting.  Physiatrist is providing close team supervision and 24 hour management of active medical problems listed below.  Physiatrist and rehab  team continue to assess barriers to discharge/monitor patient progress toward functional and medical goals  Care Tool:  Bathing    Body parts bathed by patient: Face   Body parts bathed by helper: Right arm, Right lower leg, Left arm, Left lower leg, Chest, Abdomen, Front perineal area, Buttocks, Right upper leg, Left upper leg     Bathing assist Assist Level: Total Assistance - Patient < 25%     Upper Body Dressing/Undressing Upper body dressing   What is the patient wearing?: Pull over shirt    Upper body assist Assist Level: 2 Helpers(EOB)    Lower Body Dressing/Undressing Lower body dressing      What is the patient wearing?: Pants, Incontinence brief     Lower body assist Assist for lower body dressing: 2 Helpers(2-3 helpers, sit<stand in [place])     Chartered loss adjuster assist Assist for toileting: 2 Helpers     Transfers Chair/bed transfer  Transfers assist  Chair/bed transfer activity did not occur: Safety/medical concerns  Chair/bed transfer assist level: 2 Helpers     Locomotion Ambulation   Ambulation assist   Ambulation activity did not occur: Safety/medical concerns  Assist level: 2 helpers Assistive device: (rail in hall) Max distance: 12   Walk 10 feet activity   Assist  Walk 10 feet activity did not occur: Safety/medical concerns  Assist level: 2 helpers Assistive device: Other (comment)(rail in hall)   Walk 50 feet activity   Assist Walk 50 feet with 2 turns activity did  not occur: Safety/medical concerns         Walk 150 feet activity   Assist Walk 150 feet activity did not occur: Safety/medical concerns         Walk 10 feet on uneven surface  activity   Assist Walk 10 feet on uneven surfaces activity did not occur: Safety/medical concerns         Wheelchair     Assist Will patient use wheelchair at discharge?: Yes Type of Wheelchair: Manual    Wheelchair assist level: Dependent -  Patient 0% Max wheelchair distance: 150 ft    Wheelchair 50 feet with 2 turns activity    Assist        Assist Level: Dependent - Patient 0%   Wheelchair 150 feet activity     Assist     Assist Level: Dependent - Patient 0%      Medical Problem List and Plan: 1.  Right side weakness and aphasia secondary to left basal ganglia hypertensive hemorrhage  CIR PT, OT , SLP-ELOS 21-25d   2.  Antithrombotics: -DVT/anticoagulation: Subcutaneous heparin initiated 07/14/2019.  Monitor for any bleeding episodes- change to enoxaparin to reduce freq of injection - monitor PLT             -antiplatelet therapy: N/A  3. Pain Management: Tylenol as needed 4. Mood: Provide emotional support             -antipsychotic agents: N/A 5. Neuropsych: This patient is not capable of making decisions on her own behalf. 6. Skin/Wound Care: Routine skin checks 7. Fluids/Electrolytes/Nutrition: Routine in and outs.  8.  Hypertension.  Lisinopril 20 mg twice daily, Norvasc 10 mg daily.               Vitals:   08/01/19 0500 08/01/19 0849  BP: 139/90 120/83  Pulse: 84 80  Resp: 18   Temp: 98.1 F (36.7 C)   SpO2: 98%    12/28 Still soft on amlodipine 5mg  and lisinopril to 10mg .     -may need to reduce further, put hold parameters on lisinopril  12/29: BP still soft. Will stop amlodipine.   12/30: BP still soft, will decrease lisinopril.   12/31: BP still soft; will stop Lisinopril  1/1: BP better controlled, now off all hypertensive agents.  9.  Hypernatremia-likely due to hypertonic saline             Na 151 on 12/19, l12/21 was 146, expect normalization over time   Will recheck Monday 10.  Leukocytosis             WBCs 9.1 on 12/21, 8.4 on 12/28 11.  Transaminasemia- no recent tylenol use isolated to ALT, abd exam is normal - some  ETOH use prior to [place] 1-2 drinks per day  12.  Aphasia as per husband native language is Portugese, had a nurse on acute that spoke and pt seemed to respond  better, discussed that apahsic pts may have better comprehension of native language vs languages learned later 52. E coli UTI: started on bactrim 12/21---continue for 7 days 14. Disposition: Estimated DC date is 1/15. Husband will be with patient upon discharge and will hire caregivers for her when he returns to work.    LOS: 14 days A FACE TO FACE EVALUATION WAS PERFORMED  Ajani Schnieders 08/01/2019, 12:27 PM

## 2019-08-01 NOTE — Progress Notes (Signed)
Occupational Therapy Session Note  Patient Details  Name: Melanie Madden MRN: [id number] Date of Birth: 05/30/1966  Today's Date: 08/01/2019 OT Individual Time: 1445-1531 OT Individual Time Calculation (min): 46 min   Short Term Goals: Week 2:  OT Short Term Goal 1 (Week 2): Pt will complete OOB toileting with 2 assist and LRAD OT Short Term Goal 2 (Week 2): Pt will complete 1 grooming task at the sink with Mod A OT Short Term Goal 3 (Week 2): Pt will exhibit improved Rt attention by positioning her affected UE safely during mobility with mod cuing  Skilled Therapeutic Interventions/Progress Updates:    Pt greeted in bed, [name] present. He was motivated to be part of her therapy session. Supine<sit completed with Max A, multimodal cues for initiation, sequencing, and Rt attention. While EOB pt with Rt pushing and anterior LOB. Facilitated hands in lap with pt reaching out with her Lt arm to push directly afterwards. [name] provided Max balance assist with OT set up [place]. Discussed pushers syndrome, trunk control deficits, and pts impulsivity and functional implications during toileting. +2 assist for [facility] transfer to [facility]. Provided pt with a full length mirror to increase postural awareness so she could initiate correcting Rt leaning posture. Pt needed steady assist while voiding as she would squirm and try to get up several times due to restlessness. [name] often provided safety instruction when pt tried to get up. Pt with continent B+B void! +2 for hygiene and clothing mgt, once again she needed multimodal cues for upright neutral alignment. Noted adductor tone in Rt hip in standing, with pt needing A to correct foot placement. She then transferred to the TIS, reclined for comfort and safety with full lap tray and safety belt. [name] wanted to push her in w/c down the hall. Discussed ways to promote Rt visual field scanning. Also talked about ways to provide orientation information during  conversation. [name] aware that he must directly pass pt off to RN staff before he leaves because pt is unsafe to be alone in the w/c. He verbalized understanding. RN + NT made aware of this, and also of pts location. Pt left with [name] in the dayroom, video chatting with her sister on the phone.    Therapy Documentation Precautions:  Precautions Precautions: Fall, Other (comment) Precaution Comments: pusher, right hemiparesis Restrictions Weight Bearing Restrictions: No  Pain: No s/s pain during tx   ADL:     Therapy/Group: Individual Therapy  [name] A Jayanth Szczesniak 08/01/2019, 4:09 PM

## 2019-08-01 NOTE — Progress Notes (Signed)
Speech Language Pathology Daily Session Note  Patient Details  Name: Melanie Madden MRN: [id number] Date of Birth: May 13, 1966  Today's Date: 08/01/2019 SLP Individual Time: 1300-1345 SLP Individual Time Calculation (min): 45 min  Short Term Goals: Week 2: SLP Short Term Goal 1 (Week 2): Pt will consume Dys2/thin diet with min overt s/sx aspiration, efficient mastication, oral clearance and slow rate with Mod A multimodal cues. SLP Short Term Goal 2 (Week 2): Pt will consume Dys 3 trials with min s/s aspiration, efficient mastictaion and oral clearance with liquid wash with Max A verbal cues over 3 sessisons. SLP Short Term Goal 3 (Week 2): Pt will express wants/needs via multimodal means (gestures, facial expression and communication board) with 60% accuracy with mod A verbal/visual cues. SLP Short Term Goal 4 (Week 2): Pt will imitate vowel sounds with 70% accuracy given Max A multimodal cues. SLP Short Term Goal 5 (Week 2): Pt will sustain attention to tasks for 2 minute intervals with Max A multimodal cues for redirection.  Skilled Therapeutic Interventions: Skilled treatment session focused on dysphagia and communication goals. Upon arrival, patient was incontinent of bowel. SLP facilitated session by providing total A for bed mobility during peri care as patient appeared to be actively resisting. However, as task continued, patient demonstrated increased ability to follow commands and initiate movement. Patient was repositioned upright in bed to maximize safety with PO intake. Patient consumed lunch meal of Dys. 2 textures with thin liquids without overt s/s of aspiration but required Mod verbal and tactile cues for use of small bites/sips. Patient also initially required Max A for self-feeding with faded to Min A by end of meal. Recommend patient continue current diet. Patient with minimal attempts to communicate in regards to utilizing gestures to answer yes/no questions, however, patient did  spontaneously vocalize "mm hmm" in response to a question but all other intermittent vocalizations appeared non-purposeful. Patient left upright in bed with alarm on and all needs within reach. Continue with current plan of care.      Pain No/Denies Pain   Therapy/Group: Individual Therapy  Maeleigh Buschman 08/01/2019, 2:13 PM

## 2019-08-02 NOTE — Progress Notes (Signed)
[place] PHYSICAL MEDICINE & REHABILITATION PROGRESS NOTE  Subjective/Complaints:  When asked if any issues,  Pt responded by shaking her head no slightly and blinking 1 "no"- I think she's blinking to communicate.   ROS: limited due to language/communication   Objective: Vital Signs: Blood pressure 117/83, pulse 79, temperature 98.4 F (36.9 C), temperature source Oral, resp. rate 18, height 5\' 7"  (1.702 m), weight 57.7 kg, SpO2 100 %. No results found. No results for input(s): WBC, HGB, HCT, PLT in the last 72 hours. No results for input(s): NA, K, CL, CO2, GLUCOSE, BUN, CREATININE, CALCIUM in the last 72 hours.  Physical Exam: BP 117/83 (BP Location: Right Arm)   Pulse 79   Temp 98.4 F (36.9 C) (Oral)   Resp 18   Ht 5\' 7"  (1.702 m)   Wt 57.7 kg   SpO2 100%   BMI 19.92 kg/m  Constitutional: No distress . Vital signs reviewed. Lying in bed calmly.  HEENT: EOMI, oral membranes moist Neck: supple Cardiovascular: RRR without murmur. No JVD    Respiratory: CTA Bilaterally without wheezes or rales. Normal effort    GI: BS +, non-tender, non-distended  Skin: Warm and dry.  Intact. Musc: No edema in extremities.  No tenderness in extremities. Neuro: Alert Smiles and makes good eye contact during communication. Blinks in response.  Globally aphasic, follows ~25% simple commands Exam limited due to lack of ability to follow commands, however seen spontaneously moving LUE and LLE appears to sense pain on right side, unchanged 0/5 RUE. RLE tr to 1/5 HE, 0/5 distally Tone reduced on Right side  Skin: Skin is warm and dry.  Psychiatric:  flat  Assessment/Plan: 1. Functional deficits secondary to left basal ganglia hypertensive hemorrhage which require 3+ hours per day of interdisciplinary therapy in a comprehensive inpatient rehab setting.  Physiatrist is providing close team supervision and 24 hour management of active medical problems listed below.  Physiatrist and  rehab team continue to assess barriers to discharge/monitor patient progress toward functional and medical goals  Care Tool:  Bathing    Body parts bathed by patient: Face   Body parts bathed by helper: Right arm, Right lower leg, Left arm, Left lower leg, Chest, Abdomen, Front perineal area, Buttocks, Right upper leg, Left upper leg     Bathing assist Assist Level: Total Assistance - Patient < 25%     Upper Body Dressing/Undressing Upper body dressing   What is the patient wearing?: Pull over shirt    Upper body assist Assist Level: 2 Helpers(EOB)    Lower Body Dressing/Undressing Lower body dressing      What is the patient wearing?: Pants, Incontinence brief     Lower body assist Assist for lower body dressing: 2 Helpers(2-3 helpers, sit<stand in [place])     Chartered loss adjuster assist Assist for toileting: 2 Helpers(Stedy)     Transfers Chair/bed transfer  Transfers assist  Chair/bed transfer activity did not occur: Safety/medical concerns  Chair/bed transfer assist level: 2 Helpers     Locomotion Ambulation   Ambulation assist   Ambulation activity did not occur: Safety/medical concerns  Assist level: 2 helpers Assistive device: (rail in hall) Max distance: 12   Walk 10 feet activity   Assist  Walk 10 feet activity did not occur: Safety/medical concerns  Assist level: 2 helpers Assistive device: Other (comment)(rail in hall)   Walk 50 feet activity   Assist Walk 50 feet with 2 turns activity did not occur: Safety/medical  concerns         Walk 150 feet activity   Assist Walk 150 feet activity did not occur: Safety/medical concerns         Walk 10 feet on uneven surface  activity   Assist Walk 10 feet on uneven surfaces activity did not occur: Safety/medical concerns         Wheelchair     Assist Will patient use wheelchair at discharge?: Yes Type of Wheelchair: Manual    Wheelchair assist level:  Dependent - Patient 0% Max wheelchair distance: 150 ft    Wheelchair 50 feet with 2 turns activity    Assist        Assist Level: Dependent - Patient 0%   Wheelchair 150 feet activity     Assist     Assist Level: Dependent - Patient 0%      Medical Problem List and Plan: 1.  Right side weakness and aphasia secondary to left basal ganglia hypertensive hemorrhage  CIR PT, OT , SLP-ELOS 21-25d   2.  Antithrombotics: -DVT/anticoagulation: Subcutaneous heparin initiated 07/14/2019.  Monitor for any bleeding episodes- change to enoxaparin to reduce freq of injection - monitor PLT             -antiplatelet therapy: N/A  3. Pain Management: Tylenol as needed 4. Mood: Provide emotional support             -antipsychotic agents: N/A 5. Neuropsych: This patient is not capable of making decisions on her own behalf. 6. Skin/Wound Care: Routine skin checks 7. Fluids/Electrolytes/Nutrition: Routine in and outs.  8.  Hypertension.  Lisinopril 20 mg twice daily, Norvasc 10 mg daily.               Vitals:   08/02/19 0427 08/02/19 0808  BP: 122/80 117/83  Pulse: 72 79  Resp: 18   Temp: 98.4 F (36.9 C)   SpO2: 100%    12/28 Still soft on amlodipine 5mg  and lisinopril to 10mg .     -may need to reduce further, put hold parameters on lisinopril  12/29: BP still soft. Will stop amlodipine.   12/30: BP still soft, will decrease lisinopril.   12/31: BP still soft; will stop Lisinopril  1/1: BP better controlled, now off all hypertensive agents.  9.  Hypernatremia-likely due to hypertonic saline             Na 151 on 12/19, l12/21 was 146, expect normalization over time   Will recheck Monday 10.  Leukocytosis             WBCs 9.1 on 12/21, 8.4 on 12/28 11.  Transaminasemia- no recent tylenol use isolated to ALT, abd exam is normal - some  ETOH use prior to [place] 1-2 drinks per day  12.  Aphasia as per husband native language is Portugese, had a nurse on acute that spoke and pt  seemed to respond better, discussed that apahsic pts may have better comprehension of native language vs languages learned later 61. E coli UTI: started on bactrim 12/21---continue for 7 days 14. Disposition: Estimated DC date is 1/15. Husband will be with patient upon discharge and will hire caregivers for her when he returns to work.    LOS: 15 days A FACE TO FACE EVALUATION WAS PERFORMED  Gedalia Mcmillon 08/02/2019, 11:38 AM

## 2019-08-03 ENCOUNTER — Inpatient Hospital Stay (HOSPITAL_COMMUNITY): Payer: Managed Care, Other (non HMO) | Admitting: Physical Therapy

## 2019-08-03 ENCOUNTER — Inpatient Hospital Stay (HOSPITAL_COMMUNITY): Payer: Managed Care, Other (non HMO) | Admitting: Speech Pathology

## 2019-08-03 ENCOUNTER — Inpatient Hospital Stay (HOSPITAL_COMMUNITY): Payer: Managed Care, Other (non HMO) | Admitting: Occupational Therapy

## 2019-08-03 DIAGNOSIS — R1313 Dysphagia, pharyngeal phase: Secondary | ICD-10-CM

## 2019-08-03 LAB — COMPREHENSIVE METABOLIC PANEL
ALT: 48 U/L — ABNORMAL HIGH (ref 0–44)
AST: 19 U/L (ref 15–41)
Albumin: 2.7 g/dL — ABNORMAL LOW (ref 3.5–5.0)
Alkaline Phosphatase: 60 U/L (ref 38–126)
Anion gap: 9 (ref 5–15)
BUN: 17 mg/dL (ref 6–20)
CO2: 22 mmol/L (ref 22–32)
Calcium: 9.5 mg/dL (ref 8.9–10.3)
Chloride: 102 mmol/L (ref 98–111)
Creatinine, Ser: 0.63 mg/dL (ref 0.44–1.00)
GFR calc Af Amer: >60 mL/min (ref >60)
GFR calc non Af Amer: >60 mL/min (ref >60)
Glucose, Bld: 110 mg/dL — ABNORMAL HIGH (ref 70–99)
Potassium: 4.2 mmol/L (ref 3.5–5.1)
Sodium: 133 mmol/L — ABNORMAL LOW (ref 135–145)
Total Bilirubin: 0.6 mg/dL (ref 0.3–1.2)
Total Protein: 5.8 g/dL — ABNORMAL LOW (ref 6.5–8.1)

## 2019-08-03 LAB — CBC WITH DIFFERENTIAL/PLATELET
Abs Immature Granulocytes: 0.07 10*3/uL (ref 0.00–0.07)
Basophils Absolute: 0 10*3/uL (ref 0.0–0.1)
Basophils Relative: 1 %
Eosinophils Absolute: 0.1 10*3/uL (ref 0.0–0.5)
Eosinophils Relative: 1 %
HCT: 38.1 % (ref 36.0–46.0)
Hemoglobin: 13.1 g/dL (ref 12.0–15.0)
Immature Granulocytes: 1 %
Lymphocytes Relative: 28 %
Lymphs Abs: 1.6 10*3/uL (ref 0.7–4.0)
MCH: 30.3 pg (ref 26.0–34.0)
MCHC: 34.4 g/dL (ref 30.0–36.0)
MCV: 88.2 fL (ref 80.0–100.0)
Monocytes Absolute: 0.6 10*3/uL (ref 0.1–1.0)
Monocytes Relative: 10 %
Neutro Abs: 3.5 10*3/uL (ref 1.7–7.7)
Neutrophils Relative %: 59 %
Platelets: 213 10*3/uL (ref 150–400)
RBC: 4.32 MIL/uL (ref 3.87–5.11)
RDW: 13.4 % (ref 11.5–15.5)
WBC: 5.8 10*3/uL (ref 4.0–10.5)
nRBC: 0 % (ref 0.0–0.2)

## 2019-08-03 NOTE — Progress Notes (Signed)
Physical Therapy Session Note  Patient Details  Name: Melanie Madden MRN: [id number] Date of Birth: 02-22-1966  Today's Date: 08/03/2019 PT Individual Time: [id number] PT Individual Time Calculation (min): 72 min   Short Term Goals: Week 2:  PT Short Term Goal 1 (Week 2): Pt will ambulate 25ft with max assist + 2 at rail for forced use of RLE. PT Short Term Goal 2 (Week 2): Pt will initiate WC propulsion. PT Short Term Goal 3 (Week 2): Pt will maintain sitting balance EOB with min assist up to 3 minutes.  Skilled Therapeutic Interventions/Progress Updates: Pt presented in TIS at nsg station. PTA asked pt if soiled, pt gave several blinks and eyes opened wide with PTA assuming that pt wanted to be changed (however was not fidgety). Pt transported to room and performed stand in [place] with modA and +2 for safety due to pt's impulsive nature. Pt then transported to toilet and performed toilet transfer with modA from [place]. Pt noted to have dry brief and did not void/BM while on toilet. Pt returned to TIS and transported to day room. Performed stand pivot transfer to mat total A for pivot with pt performing stand with min/modA. At mat attempted to participate in seated balance activities including reaching and tossing bean bags. Pt encouraged to keep hand at lap vs at side on mat to decrease pushing. Pt continues to demonstrate impulsive nature and occasionally pushing heavily to the left or forward. Pt then participated in standing x3 via "three musketeers" with PTA providing facilitation to wt shift to R as well as blocking R knee. Pt transferred back to TIS via squat pivot to L with maxA with +2 for safety. Pt was able to communicate via head nod that she wanted to stay in [place]. Pt left at nsg station with lap tray in place, and belt alarm on,.      Therapy Documentation Precautions:  Precautions Precautions: Fall, Other (comment) Precaution Comments: pusher, right hemiparesis Restrictions Weight  Bearing Restrictions: No General:   Vital Signs: Therapy Vitals Temp: (!) 97.4 F (36.3 C) Temp Source: Oral Pulse Rate: 91 Resp: 17 BP: 140/82 Patient Position (if appropriate): Sitting Oxygen Therapy SpO2: 100 % O2 Device: Room Air    Therapy/Group: Individual Therapy  Adeli Frost  Desaree Downen, PTA  08/03/2019, 3:51 PM

## 2019-08-03 NOTE — Progress Notes (Signed)
Occupational Therapy Weekly Progress Note  Patient Details  Name: Melanie Madden MRN: [id number] Date of Birth: 15-Mar-1966  Beginning of progress report period: 07/27/2019 End of progress report period: 08/03/2019  Patient has met 3 of 3 short term goals.    Patient has made slow progress at time of report. She has progressed with OOB toileting, able to complete [facility] transfers with +2 assist using [place]. She is Mod A for sit<stand in [place] but requires +2 for transfers due to 1 assist needed to provide Mod-Max A for unsupported sitting balance while 2nd helper maneuvers Stedy. Pt continues to be limited by profound cognitive deficits, dense Rt hemiparesis, and pusher tendencies. Pt still requires Max-2 assist for self care overall. Her SO Melanie Madden has been present with hands on caregiver education initiated during [place] toileting. Goals will be downgraded to Mod A overall to reflect slow progress.   Patient continues to demonstrate the following deficits: muscle weakness and muscle paralysis, decreased cardiorespiratoy endurance, abnormal tone, unbalanced muscle activation, decreased coordination and decreased motor planning, decreased midline orientation, decreased attention to right, right side neglect and ideational apraxia, decreased initiation, decreased attention, decreased awareness, decreased problem solving and decreased safety awareness and decreased sitting balance, decreased standing balance, decreased postural control and hemiplegia and therefore will continue to benefit from skilled OT intervention to enhance overall performance with BADL.  Patient not progressing toward long term goals.  See goal revision..    OT Short Term Goals Week 2:  OT Short Term Goal 1 (Week 2): Pt will complete OOB toileting with 2 assist and LRAD OT Short Term Goal 1 - Progress (Week 2): Met OT Short Term Goal 2 (Week 2): Pt will complete 1 grooming task at the sink with Mod A OT Short Term Goal 2 - Progress (Week  2): Met OT Short Term Goal 3 (Week 2): Pt will exhibit improved Rt attention by positioning her affected UE safely during mobility with mod cuing OT Short Term Goal 3 - Progress (Week 2): Met Week 3:  OT Short Term Goal 1 (Week 3): STGs=LTGs due to ELOS  Therapy Documentation Precautions:  Precautions Precautions: Fall, Other (comment) Precaution Comments: pusher, right hemiparesis Restrictions Weight Bearing Restrictions: No     Therapy/Group: Individual Therapy  Lorrin Bodner A Mi Balla 08/03/2019, 3:13 PM

## 2019-08-03 NOTE — Plan of Care (Signed)
Problem: RH Functional Use of Upper Extremity Goal: LTG Patient will use RT/LT upper extremity as a (OT) Description: LTG: Patient will use right/left upper extremity as a stabilizer/gross assist/diminished/nondominant/dominant level with assist, with/without cues during functional activity (OT) Outcome: Adequate for Discharge Note: Goal d/c due to slow progress and Rt inattention    Problem: RH Tub/Shower Transfers Goal: LTG Patient will perform tub/shower transfers w/assist (OT) Description: LTG: Patient will perform tub/shower transfers with assist, with/without cues using equipment (OT) Outcome: Adequate for Discharge Note: Goal d/c for pt safety   Problem: RH Balance Goal: LTG: Patient will maintain dynamic sitting balance (OT) Description: LTG:  Patient will maintain dynamic sitting balance with assistance during activities of daily living (OT) Flowsheets (Taken 08/03/2019 1601) LTG: Pt will maintain dynamic sitting balance during ADLs with: Minimal Assistance - Patient > 75% Note: Downgraded due to slow progress Goal: LTG Patient will maintain dynamic standing with ADLs (OT) Description: LTG:  Patient will maintain dynamic standing balance with assist during activities of daily living (OT)  Flowsheets (Taken 08/03/2019 1601) LTG: Pt will maintain dynamic standing balance during ADLs with: Moderate Assistance - Patient 50 - 74% Note: Downgraded due to slow progress    Problem: Sit to Stand Goal: LTG:  Patient will perform sit to stand in prep for activites of daily living with assistance level (OT) Description: LTG:  Patient will perform sit to stand in prep for activites of daily living with assistance level (OT) Flowsheets (Taken 08/03/2019 1601) LTG: PT will perform sit to stand in prep for activites of daily living with assistance level: Moderate Assistance - Patient 50 - 74% Note: Downgraded due to slow progress   Problem: RH Eating Goal: LTG Patient will perform eating  w/assist, cues/equip (OT) Description: LTG: Patient will perform eating with assist, with/without cues using equipment (OT) Flowsheets (Taken 08/03/2019 1602) LTG: Pt will perform eating with assistance level of: Minimal Assistance - Patient > 75% Note: Downgraded due to slow progress   Problem: RH Grooming Goal: LTG Patient will perform grooming w/assist,cues/equip (OT) Description: LTG: Patient will perform grooming with assist, with/without cues using equipment (OT) Flowsheets (Taken 08/03/2019 1602) LTG: Pt will perform grooming with assistance level of: Minimal Assistance - Patient > 75% Note: Downgraded due to slow progress   Problem: RH Bathing Goal: LTG Patient will bathe all body parts with assist levels (OT) Description: LTG: Patient will bathe all body parts with assist levels (OT) Flowsheets (Taken 08/03/2019 1602) LTG: Pt will perform bathing with assistance level/cueing: Moderate Assistance - Patient 50 - 74% Note: Downgraded due to slow progress   Problem: RH Dressing Goal: LTG Patient will perform upper body dressing (OT) Description: LTG Patient will perform upper body dressing with assist, with/without cues (OT). Flowsheets (Taken 08/03/2019 1602) LTG: Pt will perform upper body dressing with assistance level of: Minimal Assistance - Patient > 75% Note: Downgraded due to slow progress Goal: LTG Patient will perform lower body dressing w/assist (OT) Description: LTG: Patient will perform lower body dressing with assist, with/without cues in positioning using equipment (OT) Note: Downgraded due to slow progress   Problem: RH Toileting Goal: LTG Patient will perform toileting task (3/3 steps) with assistance level (OT) Description: LTG: Patient will perform toileting task (3/3 steps) with assistance level (OT)  Flowsheets (Taken 08/03/2019 1602) LTG: Pt will perform toileting task (3/3 steps) with assistance level: Moderate Assistance - Patient 50 - 74% Note: Downgraded due to  slow progress   Problem: RH Toilet Transfers Goal: LTG  Patient will perform toilet transfers w/assist (OT) Description: LTG: Patient will perform toilet transfers with assist, with/without cues using equipment (OT) Flowsheets (Taken 08/03/2019 1602) LTG: Pt will perform toilet transfers with assistance level of: Moderate Assistance - Patient 50 - 74% Note: Downgraded due to slow progress

## 2019-08-03 NOTE — Progress Notes (Signed)
Social Work Patient ID: Melanie Madden, female   DOB: 1966-06-01, 54 y.o.   MRN: [id number]  Received a call today from pt's spouse who had several questions about resources available at d/c.  He, also, expressed concern about the targeted d/c date of 1/15, "I don't know if that's a realistic date.  I know you need to free up the bed but she still needs a lot of help."  Attempted to explain that target date will be re-evaluated on Wednesday by team and discussed the criteria for hospital d/c. Stressed to him that her therapy will continue post d/c as well .  Spouse is talking with area private duty agencies and, with clarification, understands this is a private pay arrangement and that I will arrange for the skilled services.   I think it would be beneficial for spouse to go ahead and start attending therapy sessions to discuss what her levels of assist will feel like and to address some of his own ideas about what therapists might "try with her in their sessions...like using the bike or something...".    Jadrien Narine, LCSW

## 2019-08-03 NOTE — Progress Notes (Signed)
Speech Language Pathology Weekly Progress and Session Note  Patient Details  Name: Melanie Madden MRN: [id number] Date of Birth: 1965-11-14  Beginning of progress report period: July 26, 2019 End of progress report period: August 03, 2019  Today's Date: 08/03/2019 SLP Individual Time: 0915-0930 SLP Individual Time Calculation (min): 15 min and Today's Date: 08/03/2019 SLP Missed Time: 11 Minutes Missed Time Reason: Increased agitation;Patient unwilling to participate  Short Term Goals: Week 2: SLP Short Term Goal 1 (Week 2): Pt will consume Dys2/thin diet with min overt s/sx aspiration, efficient mastication, oral clearance and slow rate with Mod A multimodal cues. SLP Short Term Goal 1 - Progress (Week 2): Met SLP Short Term Goal 2 (Week 2): Pt will consume Dys 3 trials with min s/s aspiration, efficient mastictaion and oral clearance with liquid wash with Max A verbal cues over 3 sessisons. SLP Short Term Goal 2 - Progress (Week 2): Not met SLP Short Term Goal 3 (Week 2): Pt will express wants/needs via multimodal means (gestures, facial expression and communication board) with 60% accuracy with mod A verbal/visual cues. SLP Short Term Goal 3 - Progress (Week 2): Not met SLP Short Term Goal 4 (Week 2): Pt will imitate vowel sounds with 70% accuracy given Max A multimodal cues. SLP Short Term Goal 4 - Progress (Week 2): Not met SLP Short Term Goal 5 (Week 2): Pt will sustain attention to tasks for 2 minute intervals with Max A multimodal cues for redirection. SLP Short Term Goal 5 - Progress (Week 2): Not met    New Short Term Goals: Week 3: SLP Short Term Goal 1 (Week 3): Pt will consume Dys2/thin diet with min overt s/sx aspiration, efficient mastication, oral clearance and slow rate with Min A multimodal cues. SLP Short Term Goal 2 (Week 3): Pt will consume Dys 3 trials with min s/s aspiration, efficient mastictaion and oral clearance with liquid wash with Min A verbal cues over 3  sessisons prior to upgrade. SLP Short Term Goal 3 (Week 3): Pt will express wants/needs via multimodal means (gestures, facial expression and communication board) with 60% accuracy with mod A verbal/visual cues. SLP Short Term Goal 4 (Week 3): Pt will imitate vowel sounds with 75% accuracy given Max A multimodal cues. SLP Short Term Goal 5 (Week 3): Pt will sustain attention to tasks for 2 minute intervals with Max A multimodal cues for redirection.  Weekly Progress Updates: Patient has made minimal gains and has met 1 of 5 STGs this reporting period. Patient's progress is currently limited by poor frustration tolerance and decreased participation, especially with communication tasks. Currently, patient remains nonverbal with minimal attempts to communicate utilizing multimodal communication like gestures.  Patient can produce /m/ and /ah/ on command at times with overall Max A multimodal cues. Patient requires encouragement and overall Max A multimodal cues for sustained attention to all communication tasks for ~60 second intervals. Patient is consuming Dys. 2 textures with thin liquids with minimal overt s/s of aspiration and Mod verbal and visual cues for use of swallowing compensatory strategies. SLP will focus on trials of Dys. 3 textures this upcoming week. Patient and family education ongoing. Patient would benefit from continued skilled SLP intervention to maximize her swallowing and cognitive-linguistic function prior to discharge.      Intensity: Minumum of 1-2 x/day, 30 to 90 minutes Frequency: 3 to 5 out of 7 days Duration/Length of Stay: 1/15 Treatment/Interventions: Cognitive remediation/compensation;Cueing hierarchy;Dysphagia/aspiration precaution training;Functional tasks;Patient/family education;Therapeutic Activities;Speech/Language facilitation;Multimodal communication approach;Internal/external aids;Environmental controls  Daily Session  Skilled Therapeutic Interventions:   Skilled treatment session focused on communication goals. SLP facilitated session by providing hand over hand assist for patient to point to a line drawing of a functional item from a field of 2 with goal of utilizing a communication board. Patient unable/unwilling to point despite hand over hand assist and gabbed pictures out of clinicians hands X 2, crumpled them up and threw them down. SLP attempted vocalizations of phonemes without success despite Max A multimodal cues and max encouragement. Session ended early due ot increased frustration/lack of participation. Patient left upright in bed with alarm on and all needs within reach. Continue with current plan of care.      Pain No indications of pain   Therapy/Group: Individual Therapy  Rosealynn Mateus 08/03/2019, 12:45 PM

## 2019-08-03 NOTE — Progress Notes (Signed)
[place] PHYSICAL MEDICINE & REHABILITATION PROGRESS NOTE  Subjective/Complaints:   Remains aphasic closes eyes intermittently, will not open mouth or move Left arm to command  ROS: limited due to language/communication   Objective: Vital Signs: Blood pressure (!) 95/58, pulse 80, temperature 98.7 F (37.1 C), resp. rate 18, height 5\' 7"  (1.702 m), weight 57.7 kg, SpO2 99 %. No results found. Recent Labs    08/03/19 0622  WBC 5.8  HGB 13.1  HCT 38.1  PLT 213   Recent Labs    08/03/19 0622  NA 133*  K 4.2  CL 102  CO2 22  GLUCOSE 110*  BUN 17  CREATININE 0.63  CALCIUM 9.5    Physical Exam: BP (!) 95/58   Pulse 80   Temp 98.7 F (37.1 C)   Resp 18   Ht 5\' 7"  (1.702 m)   Wt 57.7 kg   SpO2 99%   BMI 19.92 kg/m  Constitutional: No distress . Vital signs reviewed. Lying in bed calmly.  HEENT: EOMI, oral membranes moist Neck: supple Cardiovascular: RRR without murmur. No JVD    Respiratory: CTA Bilaterally without wheezes or rales. Normal effort    GI: BS +, non-tender, non-distended  Skin: Warm and dry.  Intact. Musc: No edema in extremities.  No tenderness in extremities. Neuro: Alert Smiles and makes good eye contact during communication. Blinks in response.  Globally aphasic, follows ~25% simple commands Exam limited due to lack of ability to follow commands, however seen spontaneously moving LUE and LLE appears to sense pain on right side, unchanged 0/5 RUE. RLE tr to 1/5 HE, 0/5 distally Tone reduced on Right side  Skin: Skin is warm and dry.  Psychiatric:  flat  Assessment/Plan: 1. Functional deficits secondary to left basal ganglia hypertensive hemorrhage which require 3+ hours per day of interdisciplinary therapy in a comprehensive inpatient rehab setting.  Physiatrist is providing close team supervision and 24 hour management of active medical problems listed below.  Physiatrist and rehab team continue to assess barriers to discharge/monitor  patient progress toward functional and medical goals  Care Tool:  Bathing    Body parts bathed by patient: Face   Body parts bathed by helper: Right arm, Right lower leg, Left arm, Left lower leg, Chest, Abdomen, Front perineal area, Buttocks, Right upper leg, Left upper leg     Bathing assist Assist Level: Total Assistance - Patient < 25%     Upper Body Dressing/Undressing Upper body dressing   What is the patient wearing?: Pull over shirt    Upper body assist Assist Level: 2 Helpers(EOB)    Lower Body Dressing/Undressing Lower body dressing      What is the patient wearing?: Pants, Incontinence brief     Lower body assist Assist for lower body dressing: 2 Helpers(2-3 helpers, sit<stand in [place])     Chartered loss adjuster assist Assist for toileting: 2 Helpers(Stedy)     Transfers Chair/bed transfer  Transfers assist  Chair/bed transfer activity did not occur: Safety/medical concerns  Chair/bed transfer assist level: 2 Helpers     Locomotion Ambulation   Ambulation assist   Ambulation activity did not occur: Safety/medical concerns  Assist level: 2 helpers Assistive device: (rail in hall) Max distance: 12   Walk 10 feet activity   Assist  Walk 10 feet activity did not occur: Safety/medical concerns  Assist level: 2 helpers Assistive device: Other (comment)(rail in hall)   Walk 50 feet activity   Assist Walk 50  feet with 2 turns activity did not occur: Safety/medical concerns         Walk 150 feet activity   Assist Walk 150 feet activity did not occur: Safety/medical concerns         Walk 10 feet on uneven surface  activity   Assist Walk 10 feet on uneven surfaces activity did not occur: Safety/medical concerns         Wheelchair     Assist Will patient use wheelchair at discharge?: Yes Type of Wheelchair: Manual    Wheelchair assist level: Dependent - Patient 0% Max wheelchair distance: 150 ft     Wheelchair 50 feet with 2 turns activity    Assist        Assist Level: Dependent - Patient 0%   Wheelchair 150 feet activity     Assist     Assist Level: Dependent - Patient 0%      Medical Problem List and Plan: 1.  Right side weakness and aphasia secondary to left basal ganglia hypertensive hemorrhage  CIR PT, OT , SLP-tent d/c 1/15 2.  Antithrombotics: -DVT/anticoagulation: Subcutaneous heparin initiated 07/14/2019.  Monitor for any bleeding episodes- change to enoxaparin to reduce freq of injection -1/4 normal PLT             -antiplatelet therapy: N/A  3. Pain Management: Tylenol as needed 4. Mood: Provide emotional support             -antipsychotic agents: N/A 5. Neuropsych: This patient is not capable of making decisions on her own behalf. 6. Skin/Wound Care: Routine skin checks 7. Fluids/Electrolytes/Nutrition: Routine in and outs. BUN/Cr normal po intake ~652ml per day recorded  8.  Hypertension.  Lisinopril 20 mg twice daily, Norvasc 10 mg daily.               Vitals:   08/03/19 0550 08/03/19 0552  BP:  (!) 95/58  Pulse: 77 80  Resp: 18   Temp: 98.7 F (37.1 C)   SpO2: 99% 99%    1/1: BP still a little soft check orthostatic vitals if there is a drop may need IVF bolus  9.  Hypernatremia-likely due to hypertonic saline             resolved Na 133 10.  Leukocytosis             WBCs 9.1 on 12/21, 8.4 on 12/28 11.  Transaminasemia- no recent tylenol use isolated to ALT, abd exam is normal - some  ETOH use prior to [place] 1-2 drinks per day  12.  Aphasia as per husband native language is Portugese, had a nurse on acute that spoke and pt seemed to respond better, discussed that apahsic pts may have better comprehension of native language vs languages learned later 85. E coli UTI: started on bactrim 12/21---continue for 7 days 14. Disposition: Estimated DC date is 1/15. Husband will be with patient upon discharge and will hire caregivers for her when he  returns to work.    LOS: 16 days A FACE TO FACE EVALUATION WAS PERFORMED  Charlett Blake 08/03/2019, 8:36 AM

## 2019-08-03 NOTE — Progress Notes (Signed)
Occupational Therapy Session Note  Patient Details  Name: Melanie Madden MRN: [id number] Date of Birth: 1966-02-20  Today's Date: 08/03/2019 OT Individual Time: [id number] OT Individual Time Calculation (min): 59 min   Short Term Goals: Week 2:  OT Short Term Goal 1 (Week 2): Pt will complete OOB toileting with 2 assist and LRAD OT Short Term Goal 2 (Week 2): Pt will complete 1 grooming task at the sink with Mod A OT Short Term Goal 3 (Week 2): Pt will exhibit improved Rt attention by positioning her affected UE safely during mobility with mod cuing  Skilled Therapeutic Interventions/Progress Updates:    Pt greeted in bed with no s/s pain. Supine<sit completed with Max A. Mod A for sitting balance due to combination of Rt and forward lean. Once Stedy was placed, pt completed sit<stand with Mod A of 1. 2nd helper present to drive Stedy while OT provided Mod-Max balance assist on Rt side due to postural deficits. Pt with bowel incontinence in brief and she continued to void while sitting in shower on shower chair. Total A for hygiene, Max A for bathing tasks overall. Pt needed initial HOH cuing to wash UB and HOH assistance for using flaccid R UE to wash Lt side. Mod A for sitting balance throughout bathing. She helped to manage and position affected UE with mod cuing from therapist. Transitioned to dressing tasks sit<stand using Stedy. Pt impulsive with leaning forward to pull up pants, needed A to thread and pull up for safety. Pt visibly fatigued at this point, needing Max A for balance while in the [place] while 2nd helper completed ADL task. Transferred to the TIS, where pt donned shirt. She initiated threading head and Rt arm. Able to elevate Rt UE so OT could thread Rt arm. Continued working on motor planning, NMR, and Rt attention by brushing and blow-drying hair. Initial HOH to attend to Rt side of head, but pt able to sustain participation in stated tasks with supervision for 10-15 second windows.  HOH for handwashing using sanitizer. Pt was reclined in TIS for safety, safety belt and full lap tray applied. She was then escorted to the RN station and left in care of NT for lunch.      Therapy Documentation Precautions:  Precautions Precautions: Fall, Other (comment) Precaution Comments: pusher, right hemiparesis Restrictions Weight Bearing Restrictions: No Pain: No s/s pain during tx   ADL:       Therapy/Group: Individual Therapy  Romilda Proby A Athalia Setterlund 08/03/2019, 12:28 PM

## 2019-08-04 ENCOUNTER — Inpatient Hospital Stay (HOSPITAL_COMMUNITY): Payer: Managed Care, Other (non HMO) | Admitting: Occupational Therapy

## 2019-08-04 ENCOUNTER — Inpatient Hospital Stay (HOSPITAL_COMMUNITY): Payer: Managed Care, Other (non HMO) | Admitting: Speech Pathology

## 2019-08-04 ENCOUNTER — Inpatient Hospital Stay (HOSPITAL_COMMUNITY): Payer: Managed Care, Other (non HMO)

## 2019-08-04 MED ORDER — METHYLPHENIDATE HCL 5 MG PO TABS
5.0000 mg | ORAL_TABLET | Freq: Two times a day (BID) | ORAL | Status: DC
  Administered 2019-08-04 – 2019-08-19 (×30): 5 mg via ORAL
  Filled 2019-08-04 (×30): qty 1

## 2019-08-04 NOTE — Progress Notes (Signed)
Occupational Therapy Session Note  Patient Details  Name: Melanie Madden MRN: [id number] Date of Birth: Oct 30, 1965  Today's Date: 08/04/2019 OT Individual Time: 1118-1200 OT Individual Time Calculation (min): 42 min    Short Term Goals: Week 3:  OT Short Term Goal 1 (Week 3): STGs=LTGs due to ELOS  Skilled Therapeutic Interventions/Progress Updates:    Pt received at RN station, pt drinking ensure from coffee cup with out coughing and min cuing for small sips. Pt nodding head"yes" when asked about using the bathroom. Staff report she has not been to the bathroom in some time. OT assisted pt back to room via wheelchair and pt standing into stedy from wheelchair with max A this session. Pt required increased time for initiation, sequencing, and multimodal cuing for transfers. Pt standing with min A while therapist assisting pt with LB clothing management. Pt incontinent of  BM this session in brief. Pt sitting on toilet but unable to void further. Pt exhibits min - mod sitting balance on the commode which is a significant improvement from last week. Pt standing into stedy for several minutes for therapist  assists with LB clothing management and hygiene. Pt does begin to require +2 for trunk control and safety in stedy secondary to increased fatigue. Pt assisted back to hospital bed via stedy. Sit >supine with mod A . OT assisted pt with repositioning. Call bell and all needed items within reach.   Therapy Documentation Precautions:  Precautions Precautions: Fall, Other (comment) Precaution Comments: pusher, right hemiparesis Restrictions Weight Bearing Restrictions: No   Therapy/Group: Individual Therapy  Gypsy Decant 08/04/2019, 12:15 PM

## 2019-08-04 NOTE — Progress Notes (Signed)
Physical Therapy Weekly Progress Note  Patient Details  Name: Melanie Madden MRN: [id number] Date of Birth: [birth year]  Beginning of progress report period: July 27, 2019 End of progress report period: August 04, 2019  Today's Date: 08/04/2019   Patient has met 0 of 3 short term goals.  Pt has made slower than anticipated gains during this current course of therapy. Pt continues to demonstrate decreased sustained attention which has limited improvements in sitting balance. Pt is able to demonstrate self correction in posterior and lateral leans however at time overcompensates or will impulsively reach causing LOB. Pt also continues to demonstrate some improvement in pushing which has facilitated improvement in sitting balance. Due to decreased sitting balance initiating w/c mobility has be limited for safety.   Patient continues to demonstrate the following deficits muscle weakness and muscle paralysis, impaired timing and sequencing, abnormal tone, unbalanced muscle activation, decreased coordination and decreased motor planning and decreased initiation, decreased attention, decreased awareness, decreased problem solving and decreased safety awareness and therefore will continue to benefit from skilled PT intervention to increase functional independence with mobility.  Patient progressing toward long term goals..  Continue plan of care.  PT Short Term Goals Week 2:  PT Short Term Goal 1 (Week 2): Pt will ambulate 52f with max assist + 2 at rail for forced use of RLE. PT Short Term Goal 1 - Progress (Week 2): Progressing toward goal PT Short Term Goal 2 (Week 2): Pt will initiate WC propulsion. PT Short Term Goal 2 - Progress (Week 2): Progressing toward goal PT Short Term Goal 3 (Week 2): Pt will maintain sitting balance EOB with min assist up to 3 minutes. PT Short Term Goal 3 - Progress (Week 2): Progressing toward goal Week 3:  PT Short Term Goal 1 (Week 3): Pt will transfer with max  assist of 1 consistently PT Short Term Goal 2 (Week 3): PT will propell WC 561fwith mod assist using hemi technique PT Short Term Goal 3 (Week 3): Pt will ambulate at rail in hall x 1593fith max assist +2    Therapy Documentation Precautions:  Precautions Precautions: Fall, Other (comment) Precaution Comments: pusher, right hemiparesis Restrictions Weight Bearing Restrictions: No General:   Vital Signs: Therapy Vitals Temp: (!) 97.5 F (36.4 C) Temp Source: Oral Pulse Rate: 89 Resp: 17 BP: 124/72 Patient Position (if appropriate): Lying Oxygen Therapy SpO2: 100 % O2 Device: Room Air   Therapy/Group: Individual Therapy  Rosita DeChalus  Rosita DeChalus, PTA    AusBarrie Folk, DPT       08/04/2019, 4:53 PM

## 2019-08-04 NOTE — Progress Notes (Addendum)
Speech Language Pathology Daily Session Note  Patient Details  Name: Melanie Madden MRN: [id number] Date of Birth: 1966/02/22  Today's Date: 08/04/2019 SLP Individual Time: [id number]; 1045-1100 SLP Individual Time Calculation (min): 15 min; 15 min  Short Term Goals: Week 3: SLP Short Term Goal 1 (Week 3): Pt will consume Dys2/thin diet with min overt s/sx aspiration, efficient mastication, oral clearance and slow rate with Min A multimodal cues. SLP Short Term Goal 2 (Week 3): Pt will consume Dys 3 trials with min s/s aspiration, efficient mastictaion and oral clearance with liquid wash with Min A verbal cues over 3 sessisons prior to upgrade. SLP Short Term Goal 3 (Week 3): Pt will express wants/needs via multimodal means (gestures, facial expression and communication board) with 60% accuracy with mod A verbal/visual cues. SLP Short Term Goal 4 (Week 3): Pt will imitate vowel sounds with 75% accuracy given Max A multimodal cues. SLP Short Term Goal 5 (Week 3): Pt will sustain attention to tasks for 2 minute intervals with Max A multimodal cues for redirection.  Skilled Therapeutic Interventions:  Skilled treatment session focused on communication goals. SLP facilitated session by having pt engage in bicep curls while attempting to count, sigh, grunt or exhale during reps. After multiple sets, pt begin to make some articulatory movements and was able to exhale (blow out) x 2 on command. Pt returned to nursing station at end of session.   Skilled treatment session focused on communication goals. SLP was at nursing station, pt appeared to recognize this writer and taped on her lap tray to gain my attention. Pt nodded head to "Yes" when asked if she wanted to work with me. SLP engaged pt in similar activities again with pt making phrase length jargon. It would appear that pairing physical activity with verbalization was helpful given her enjoyment of leading aerobic classes.      Pain     Therapy/Group: Individual Therapy  Ilana Prezioso 08/04/2019, 11:00 AM

## 2019-08-04 NOTE — Progress Notes (Signed)
[facility] PHYSICAL MEDICINE & REHABILITATION PROGRESS NOTE  Subjective/Complaints:     ROS: limited due to language/communication   Objective: Vital Signs: Blood pressure 123/89, pulse 76, temperature 98 F (36.7 C), temperature source Oral, resp. rate 16, height 5\' 7"  (1.702 m), weight 57.7 kg, SpO2 99 %. No results found. Recent Labs    08/03/19 0622  WBC 5.8  HGB 13.1  HCT 38.1  PLT 213   Recent Labs    08/03/19 0622  NA 133*  K 4.2  CL 102  CO2 22  GLUCOSE 110*  BUN 17  CREATININE 0.63  CALCIUM 9.5    Physical Exam: BP 123/89 (BP Location: Left Arm)   Pulse 76   Temp 98 F (36.7 C) (Oral)   Resp 16   Ht 5\' 7"  (1.702 m)   Wt 57.7 kg   SpO2 99%   BMI 19.92 kg/m  Constitutional: No distress . Vital signs reviewed. Lying in bed calmly.  HEENT: EOMI, oral membranes moist Neck: supple Cardiovascular: RRR without murmur. No JVD    Respiratory: CTA Bilaterally without wheezes or rales. Normal effort    GI: BS +, non-tender, non-distended  Skin: Warm and dry.  Intact. Musc: No edema in extremities.  No tenderness in extremities. Neuro: Alert Smiles and makes good eye contact during communication. Blinks in response.  Globally aphasic, follows ~25% simple commands Exam limited due to lack of ability to follow commands, however seen spontaneously moving LUE and LLE appears to sense pain on right side, unchanged 0/5 RUE. RLE tr to 1/5 HE, 0/5 distally Tone reduced on Right side  Skin: Skin is warm and dry.  Psychiatric:  flat  Assessment/Plan: 1. Functional deficits secondary to left basal ganglia hypertensive hemorrhage which require 3+ hours per day of interdisciplinary therapy in a comprehensive inpatient rehab setting.  Physiatrist is providing close team supervision and 24 hour management of active medical problems listed below.  Physiatrist and rehab team continue to assess barriers to discharge/monitor patient progress toward functional and  medical goals  Care Tool:  Bathing    Body parts bathed by patient: Face, Chest, Abdomen, Front perineal area   Body parts bathed by helper: Right arm, Left arm, Buttocks, Right upper leg, Left upper leg, Right lower leg, Left lower leg     Bathing assist Assist Level: Maximal Assistance - Patient 24 - 49%     Upper Body Dressing/Undressing Upper body dressing   What is the patient wearing?: Pull over shirt    Upper body assist Assist Level: Moderate Assistance - Patient 50 - 74%    Lower Body Dressing/Undressing Lower body dressing      What is the patient wearing?: Pants, Incontinence brief     Lower body assist Assist for lower body dressing: 2 Helpers     Toileting Toileting    Toileting assist Assist for toileting: 2 Helpers(using Stedy)     Transfers Chair/bed transfer  Transfers assist  Chair/bed transfer activity did not occur: Safety/medical concerns  Chair/bed transfer assist level: 2 Helpers     Locomotion Ambulation   Ambulation assist   Ambulation activity did not occur: Safety/medical concerns  Assist level: 2 helpers Assistive device: (rail in hall) Max distance: 12   Walk 10 feet activity   Assist  Walk 10 feet activity did not occur: Safety/medical concerns  Assist level: 2 helpers Assistive device: Other (comment)(rail in hall)   Walk 50 feet activity   Assist Walk 50 feet with 2 turns activity  did not occur: Safety/medical concerns         Walk 150 feet activity   Assist Walk 150 feet activity did not occur: Safety/medical concerns         Walk 10 feet on uneven surface  activity   Assist Walk 10 feet on uneven surfaces activity did not occur: Safety/medical concerns         Wheelchair     Assist Will patient use wheelchair at discharge?: Yes Type of Wheelchair: Manual    Wheelchair assist level: Dependent - Patient 0% Max wheelchair distance: 150 ft    Wheelchair 50 feet with 2 turns  activity    Assist        Assist Level: Dependent - Patient 0%   Wheelchair 150 feet activity     Assist     Assist Level: Dependent - Patient 0%      Medical Problem List and Plan: 1.  Right side weakness and aphasia secondary to left basal ganglia hypertensive hemorrhage  CIR PT, OT , SLP-tent d/c 1/15 Discussed with PT, no motor return thus far, short bouts of sustained unsupported  Sitting mainly attention based will trial methylphenidate  2.  Antithrombotics: -DVT/anticoagulation: Subcutaneous heparin initiated 07/14/2019.  Monitor for any bleeding episodes- change to enoxaparin to reduce freq of injection -1/4 normal PLT             -antiplatelet therapy: N/A  3. Pain Management: Tylenol as needed 4. Mood: Provide emotional support             -antipsychotic agents: N/A 5. Neuropsych: This patient is not capable of making decisions on her own behalf. 6. Skin/Wound Care: Routine skin checks 7. Fluids/Electrolytes/Nutrition: Routine in and outs. BUN/Cr normal po intake ~665ml per day recorded  8.  Hypertension.  Lisinopril 20 mg twice daily, Norvasc 10 mg daily.  On hold, normotensive             Vitals:   08/03/19 1957 08/04/19 0545  BP: 120/74 123/89  Pulse: 79 76  Resp: 18 16  Temp: 98 F (36.7 C) 98 F (36.7 C)  SpO2: 99% 99%    1/1: BP still a little soft check orthostatic vitals if there is a drop may need IVF bolus  Tachycardia has resolved  9.  Hypernatremia-likely due to hypertonic saline             resolved Na 133 10.  Leukocytosis             Resolved  11.  Transaminasemia- no recent tylenol use isolated to ALT, abd exam is normal - some  ETOH use prior to [place] 1-2 drinks per day  12.  Aphasia as per husband native language is Portugese, had a nurse on acute that spoke and pt seemed to respond better, discussed that apahsic pts may have better comprehension of native language vs languages learned later 35. E coli UTI: started on bactrim  12/21--completed course  14. Disposition: Estimated DC date is 1/15. Husband will be with patient upon discharge and will hire caregivers for her when he returns to work.    LOS: 17 days A FACE TO FACE EVALUATION WAS PERFORMED  Charlett Blake 08/04/2019, 8:21 AM

## 2019-08-04 NOTE — Progress Notes (Signed)
Physical Therapy Session Note  Patient Details  Name: Melanie Madden MRN: [id number] Date of Birth: 15-Feb-1966  Today's Date: 08/04/2019 PT Individual Time: 805-900 and 1400-1500 PT Individual Time Calculation (min): 55 min and 60 min   Short Term Goals: Week 2:  PT Short Term Goal 1 (Week 2): Pt will ambulate 21f with max assist + 2 at rail for forced use of RLE. PT Short Term Goal 2 (Week 2): Pt will initiate WC propulsion. PT Short Term Goal 3 (Week 2): Pt will maintain sitting balance EOB with min assist up to 3 minutes.  Skilled Therapeutic Interventions/Progress Updates: Pt presented in bed with NT present completing breakfast. PTA attempted to don shorts threading pants with therapists providing max cues. Pt attempted to assist clearing shorts over hips however PTA noted that pt was incontinent of bladder. Pt performed rolling to R minA with max multimodal cues and increased time and to L with maxA. Therapists changed brief total A with pt able to initiate performing peri-care with washcloth. PTA then completed LB clothing management with maxA. Pt performed supine to sit with maxA for truncal support. Upon sitting pt was able to maintain upright sitting balance with LUE propped on foot board for approx 1 min. Pt performed squat pivot transfer to TIS with maxA and max multi modal cues. Pt transported to day room and participated in Cybex Kinetron 90cm/sec. Pt was able to complete x  5 cycles with PTA performing pushing of RLE. Pt was able to complete additional 2 x 10 cycles with sustained attention! Pt then transported to nsg station and left with full lap tray in place.   Tx2:  Pt presented in bed awake and alert. Pt noted to be incontinent of bladder. Pt performed rolling to L with minA and modA for sit to supine transfer in preparation to change brief in [place] Performed STS with minA with PTA stabilizing L knee to facilitate midline in standing while peri-care performed. Pt then noted to have  active BM while initiating peri-care. Pt transported to bathroom to empty B&B and complete peri-care. Pt then transferred to standard 16x16 w/c with full lap tray for trial fit. Pt transported to day room and set up for Lite Gait. Pt participated in STS with Lite Gait with L hand ace bandaged to hand rail for support. Pt was able to stand with minA however required mod-maxA for maintaining erect posture and to wt shift to R. Pt was able to participate in pre-gait activities stepping forward/backwards with LLE and was able to follow commands approx 50% of time. Pt was able to tolerate standing for bouts up to 1 min before impulsively attempting to sit. Pt returned to w/c via squat pivot and transported back to room. Pt then transferred to bed via squat pivot to R with maxA. Pt returned to supine maxA and left with bed alarm on, call bell within reach and needs met.      Therapy Documentation Precautions:  Precautions Precautions: Fall, Other (comment) Precaution Comments: pusher, right hemiparesis Restrictions Weight Bearing Restrictions: No General:   Vital Signs: Therapy Vitals Temp: (!) 97.5 F (36.4 C) Temp Source: Oral Pulse Rate: 89 Resp: 17 BP: 124/72 Patient Position (if appropriate): Lying Oxygen Therapy SpO2: 100 % O2 Device: Room Air    Therapy/Group: Individual Therapy  Jerzey Komperda  Tycen Dockter, PTA  08/04/2019, 4:42 PM

## 2019-08-05 ENCOUNTER — Inpatient Hospital Stay (HOSPITAL_COMMUNITY): Payer: Managed Care, Other (non HMO)

## 2019-08-05 ENCOUNTER — Inpatient Hospital Stay (HOSPITAL_COMMUNITY): Payer: Managed Care, Other (non HMO) | Admitting: Physical Therapy

## 2019-08-05 NOTE — Progress Notes (Signed)
Physical Therapy Session Note  Patient Details  Name: Melanie Madden MRN: [id number] Date of Birth: 09-06-1965  Today's Date: 08/05/2019 PT Individual Time: 0806-0900 PT Individual Time Calculation (min): 54 min   Short Term Goals: Week 2:  PT Short Term Goal 1 (Week 2): Pt will ambulate 50f with max assist + 2 at rail for forced use of RLE. PT Short Term Goal 1 - Progress (Week 2): Progressing toward goal PT Short Term Goal 2 (Week 2): Pt will initiate WC propulsion. PT Short Term Goal 2 - Progress (Week 2): Not met PT Short Term Goal 3 (Week 2): Pt will maintain sitting balance EOB with min assist up to 3 minutes. PT Short Term Goal 3 - Progress (Week 2): Progressing toward goal Week 3:     Skilled Therapeutic Interventions/Progress Updates: Pt presented in bed with NT completing breakfast. Pt noted to be incontinent of bladder. Pt performed rolling to R minA and performed supine to sit maxA with cues. [place]S [place] for safety and NT performed peri-care in standing. While sitting in [place] threaded pants and pt was able to perform another stand to to allow PTA to pull up shorts. Pt then transferred to standard w/c. Full lap tray placed in front of pt to minimize pt impulsively reaching forward. Pt then transported to day room and participated in w/c mobility training. PTA placed theraband on w/c rim for feedback. Pt was able to follow command to use rim and was initially unable to coordinate use of LUE and LLE. Pt was able to use L hand while PTA coordinated movement with LLE with pt able to then move both UE and LE however had difficulty motor planning placing foot to floor and would perform motion with foot 1-2 inches above floor. Pt then performed squat pivot transfer to mat with maxA. Pt varied from maxA to SBA for sitting balance this session and was noted to attempt to correct sitting balance when increased lean to L. Pt did have x 2 LOB to L however of duration  sitting EOM (approx 5 min). Attempted to perform squat pivot transfer to R however pt resistive to scooting to R forcefully attempting scoot to L. TIS was then placed to L and pt initiated scoot to L. Pt completed transfer to TIS with maxA and max cues for sequencing with pt becoming visibly frustrated impulsively attempting to transfer to TIS on own. Once transfer completed pt remained in TIS and transported to room. Pt left in room with belt alarm on, call bell within reach and nsg notified of pt's status.      Therapy Documentation Precautions:  Precautions Precautions: Fall, Other (comment) Precaution Comments: pusher, right hemiparesis Restrictions Weight Bearing Restrictions: No General:   Vital Signs: Therapy Vitals Temp: 97.7 F (36.5 C) Pulse Rate: 93 Resp: 17 BP: (!) 131/91 Patient Position (if appropriate): Sitting Oxygen Therapy SpO2: 100 % O2 Device: Room Air Pain: Pain Assessment Pain Score: 0-No pain Mobility:   Locomotion :    Trunk/Postural Assessment :    Balance:   Exercises:   Other Treatments:      Therapy/Group: Individual Therapy  Melanie Madden 08/05/2019, 4:09 PM

## 2019-08-05 NOTE — Progress Notes (Signed)
[facility] PHYSICAL MEDICINE & REHABILITATION PROGRESS NOTE  Subjective/Complaints:  Remains globally aphasic    ROS: limited due to language/communication   Objective: Vital Signs: Blood pressure 128/90, pulse 82, temperature 98.7 F (37.1 C), resp. rate 18, height _0  (1.702 m), weight 57.7 kg, SpO2 99 %. No results found. Recent Labs    08/03/19 0622  WBC 5.8  HGB 13.1  HCT 38.1  PLT 213   Recent Labs    08/03/19 0622  NA 133*  K 4.2  CL 102  CO2 22  GLUCOSE 110*  BUN 17  CREATININE 0.63  CALCIUM 9.5    Physical Exam: BP 128/90 (BP Location: Left Arm)   Pulse 82   Temp 98.7 F (37.1 C)   Resp 18   Ht _1  (1.702 m)   Wt 57.7 kg   SpO2 99%   BMI 19.92 kg/m  Constitutional: No distress . Vital signs reviewed. Lying in bed calmly.  HEENT: EOMI, oral membranes moist Neck: supple Cardiovascular: RRR without murmur. No JVD    Respiratory: CTA Bilaterally without wheezes or rales. Normal effort    GI: BS +, non-tender, non-distended  Skin: Warm and dry.  Intact. Musc: No edema in extremities.  No tenderness in extremities. Neuro: Alert Smiles and makes good eye contact during communication. Blinks in response.  Globally aphasic,  Exam limited due to lack of ability to follow commands, however seen spontaneously moving LUE and LLE appears to sense pain on right side, unchanged 0/5 RUE. RLE tr to 1/5 HE, 0/5 distally Tone reduced on Right side  Skin: Skin is warm and dry.  Psychiatric:  flat  Assessment/Plan: 1. Functional deficits secondary to left basal ganglia hypertensive hemorrhage which require 3+ hours per day of interdisciplinary therapy in a comprehensive inpatient rehab setting.  Physiatrist is providing close team supervision and 24 hour management of active medical problems listed below.  Physiatrist and rehab team continue to assess barriers to discharge/monitor patient progress toward functional and medical goals  Care  Tool:  Bathing    Body parts bathed by patient: Face, Chest, Abdomen, Front perineal area   Body parts bathed by helper: Right arm, Left arm, Buttocks, Right upper leg, Left upper leg, Right lower leg, Left lower leg     Bathing assist Assist Level: Maximal Assistance - Patient 24 - 49%     Upper Body Dressing/Undressing Upper body dressing   What is the patient wearing?: Pull over shirt    Upper body assist Assist Level: Moderate Assistance - Patient 50 - 74%    Lower Body Dressing/Undressing Lower body dressing      What is the patient wearing?: Pants, Incontinence brief     Lower body assist Assist for lower body dressing: 2 Helpers     Toileting Toileting    Toileting assist Assist for toileting: 2 Helpers     Transfers Chair/bed transfer  Transfers assist  Chair/bed transfer activity did not occur: Safety/medical concerns  Chair/bed transfer assist level: 2 Helpers     Locomotion Ambulation   Ambulation assist   Ambulation activity did not occur: Safety/medical concerns  Assist level: 2 helpers Assistive device: (rail in hall) Max distance: 12   Walk 10 feet activity   Assist  Walk 10 feet activity did not occur: Safety/medical concerns  Assist level: 2 helpers Assistive device: Other (comment)(rail in hall)   Walk 50 feet activity   Assist Walk 50 feet with 2 turns activity did not occur: Safety/medical concerns  Walk 150 feet activity   Assist Walk 150 feet activity did not occur: Safety/medical concerns         Walk 10 feet on uneven surface  activity   Assist Walk 10 feet on uneven surfaces activity did not occur: Safety/medical concerns         Wheelchair     Assist Will patient use wheelchair at discharge?: Yes Type of Wheelchair: Manual    Wheelchair assist level: Dependent - Patient 0% Max wheelchair distance: 150 ft    Wheelchair 50 feet with 2 turns activity    Assist        Assist  Level: Dependent - Patient 0%   Wheelchair 150 feet activity     Assist     Assist Level: Dependent - Patient 0%      Medical Problem List and Plan: 1.  Right side weakness and aphasia secondary to left basal ganglia hypertensive hemorrhage  CIR PT, OT , SLP-tent d/c 1/15 Discussed with PT, no motor return thus far, short bouts of sustained unsupported  Sitting mainly attention based will trial methylphenidate  Team conference today please see physician documentation under team conference tab, met with team face-to-face to discuss problems,progress, and goals. Formulized individual treatment plan based on medical history, underlying problem and comorbidities. 2.  Antithrombotics: -DVT/anticoagulation: Subcutaneous heparin initiated 07/14/2019.  Monitor for any bleeding episodes- change to enoxaparin to reduce freq of injection -1/4 normal PLT             -antiplatelet therapy: N/A  3. Pain Management: Tylenol as needed 4. Mood: Provide emotional support             -antipsychotic agents: N/A 5. Neuropsych: This patient is not capable of making decisions on her own behalf. 6. Skin/Wound Care: Routine skin checks 7. Fluids/Electrolytes/Nutrition: Routine in and outs. BUN/Cr normal po intake ~610m per day recorded  8.  Hypertension.  Lisinopril 20 mg twice daily, Norvasc 10 mg daily.  On hold, normotensive             Vitals:   08/04/19 1950 08/05/19 0548  BP: 130/75 128/90  Pulse: 90 82  Resp: 18 18  Temp: 98.8 F (37.1 C) 98.7 F (37.1 C)  SpO2: 100% 99%    1/1: BP still a little soft check orthostatic vitals if there is a drop may need IVF bolus  Tachycardia has resolved - monitor on methylphenidate  9.  Hypernatremia-likely due to hypertonic saline             resolved Na 133 10.  Leukocytosis             Resolved  11.  Transaminasemia- no recent tylenol use isolated to ALT, abd exam is normal - some  ETOH use prior to [place] drinks per day  12.  Aphasia as per  husband native language is Portugese, had a nurse on acute that spoke and pt seemed to respond better, discussed that apahsic pts may have better comprehension of native language vs languages learned later 140 E coli UNGE:[id number]. Disposition: Estimated DC date is 1/15. Husband will be with patient upon discharge and will hire caregivers for her when he returns to work.    L[place]: 18 [place]2019, 7:30 AM

## 2019-08-05 NOTE — Plan of Care (Signed)
  Problem: RH SKIN INTEGRITY Goal: RH STG MAINTAIN SKIN INTEGRITY WITH ASSISTANCE Description: STG Maintain Skin Integrity With min Assistance. Outcome: Progressing Goal: RH STG ABLE TO PERFORM INCISION/WOUND CARE W/ASSISTANCE Description: STG Able To Perform Wound Care With min Assistance. Outcome: Progressing   Problem: RH PAIN MANAGEMENT Goal: RH STG PAIN MANAGED AT OR BELOW PT'S PAIN GOAL Description: <3 on a 0-10 pain scale Outcome: Progressing

## 2019-08-05 NOTE — Progress Notes (Signed)
Speech Language Pathology Daily Session Note  Patient Details  Name: Melanie Madden MRN: [id number] Date of Birth: 1966/03/21  Today's Date: 08/05/2019 SLP Individual Time: 1102-1200 SLP Individual Time Calculation (min): 58 min  Short Term Goals: Week 3: SLP Short Term Goal 1 (Week 3): Pt will consume Dys2/thin diet with min overt s/sx aspiration, efficient mastication, oral clearance and slow rate with Min A multimodal cues. SLP Short Term Goal 2 (Week 3): Pt will consume Dys 3 trials with min s/s aspiration, efficient mastictaion and oral clearance with liquid wash with Min A verbal cues over 3 sessisons prior to upgrade. SLP Short Term Goal 3 (Week 3): Pt will express wants/needs via multimodal means (gestures, facial expression and communication board) with 60% accuracy with mod A verbal/visual cues. SLP Short Term Goal 4 (Week 3): Pt will imitate vowel sounds with 75% accuracy given Max A multimodal cues. SLP Short Term Goal 5 (Week 3): Pt will sustain attention to tasks for 2 minute intervals with Max A multimodal cues for redirection.  Skilled Therapeutic Interventions: Skilled ST services focused on swallow and language skills. SLP facilitated receptive language skills in selecting requested lined drawling items in a field of two, pt demonstrated accuracy in 3 out 5 opportunities on 1st trial and on 1 out 5 opportunities on 2nd trial favoring items on left side with max A visual cues (demonstration, pointing to items in room.) Pt demonstrated ability to match word to lined drawling in a field of two in 0 out 5 opportunities. Pt selected item by removing it and pulling it to left side of body. SLP paired line drawling pictures (eat and drink) while consuming functional snack, to increase carryover. Pt consumed dys 3 trial snack and thin via cup with mild oral residue, cleared with verbal cues for liquid wash and mod A fade to min A verbal cues to slow rate with no overt s/s aspiration. Pt was  unable to imitate /m/ phoneme or /o/ vowel on command, even when paired with arm exercises. Pt did produce /m/ x5 when given picture of cow (targeting "moo") but not with any other pictures featuring initial m words  In 10 trials and /o/ x1 while vocalizing during the song "happy birthday" and x2 while vocalizing during counting 1-5. Pt was left in room with call bell within reach and chair alarm set. ST recommends to continue skilled ST services.      Pain Pain Assessment Pain Score: 0-No pain  Therapy/Group: Individual Therapy  Melanie Madden  [facility] 08/05/2019, 12:17 PM

## 2019-08-05 NOTE — Plan of Care (Signed)
  Problem: RH Swallowing Goal: LTG Patient will consume least restrictive diet using compensatory strategies with assistance (SLP) Description: LTG:  Patient will consume least restrictive diet using compensatory strategies with assistance (SLP) Flowsheets (Taken 08/05/2019 0714) LTG: Pt Patient will consume least restrictive diet using compensatory strategies with assistance of (SLP): Moderate Assistance - Patient 50 - 74% Note: Downgraded d/t lack of progress   Problem: RH Expression Communication Goal: LTG Patient will express needs/wants via multi-modal(SLP) Description: LTG:  Patient will express needs/wants via multi-modal communication (gestures/written, etc) with cues (SLP) Flowsheets (Taken 08/05/2019 0714) LTG: Patient will express needs/wants via multimodal communication (gestures/written, etc) with cueing (SLP): Maximal Assistance - Patient 25 - 49% Note: Downgraded d/t lack of progress   Problem: RH Attention Goal: LTG Patient will demonstrate this level of attention during functional activites (SLP) Description: LTG:  Patient will will demonstrate this level of attention during functional activites (SLP) Flowsheets (Taken 08/05/2019 0714) LTG: Patient will demonstrate this level of attention during cognitive/linguistic activities with assistance of (SLP): Maximal Assistance - Patient 25 - 49% Number of minutes patient will demonstrate attention during cognitive/linguistic activities: 5 minutes   Problem: RH Swallowing Goal: LTG Patient will participate in dysphagia therapy to increase swallow function with assistance (SLP) Description: LTG:  Patient will participate in dysphagia therapy to increase swallow function with assistance (SLP) Flowsheets (Taken 08/05/2019 0714) LTG: Pt will participate in dysphagia therapy to increase swallow function with assistance of (SLP): Maximal Assistance - Patient 25 - 49% Note: Downgraded d/t lack of progress   Problem: RH Problem Solving Goal:  LTG Patient will demonstrate problem solving for (SLP) Description: LTG:  Patient will demonstrate problem solving for basic/complex daily situations with cues  (SLP) Flowsheets (Taken 08/05/2019 0714) LTG Patient will demonstrate problem solving for: Maximal Assistance - Patient 25 - 49% Note: Downgraded d/t lack of progress

## 2019-08-05 NOTE — Progress Notes (Signed)
Occupational Therapy Session Note  Patient Details  Name: Melanie Madden MRN: [id number] Date of Birth: [birth year]  Today's Date: 08/05/2019 OT Individual Time: 1300-1415 OT Individual Time Calculation (min): 75 min    Short Term Goals: Week 1:  OT Short Term Goal 1 (Week 1): Pt will tolerate sitting EOB/EOM for 5 minutes with no more than min A in prep for functional task OT Short Term Goal 1 - Progress (Week 1): Not met OT Short Term Goal 2 (Week 1): Pt will attend to R UE/LE during bathing task with no more than min cuing. OT Short Term Goal 2 - Progress (Week 1): Not met OT Short Term Goal 3 (Week 1): Pt will complete LB dressing max A sit>stand. OT Short Term Goal 3 - Progress (Week 1): Not met OT Short Term Goal 4 (Week 1): Pt will consistently complete basic transfers with +1 assist in order to decrease caregiver burden.  Skilled Therapeutic Interventions/Progress Updates:    1:1. Pt received in TIS with NT present and OT finishes providing S for pt to self feed. Pt requires VC for small sips to finish tea with HOH A to tip cup up to mouth. Pt completes doffing shirt with A to pull shirt overhead and MAX tactile and HOH A facilitation to pull off R sleeve. pt dons shirt with MAX HOH A to thread RUE first. Pt completes lateral scoot transfer with MOD A and R knee block to EOM. Pt requires S-total A for sitting balance dynamically and S-MOD A statically d/t decreased selective attention in busy gym environment. Pt able to sort clothes pins according to color onto rack in field of 2 seated EOB with HOH A initially for pt to understand how to open/close pins. Pt completes bean bag toss reaching dynamically with up to MAX A for sitting balance seated EOB requiring increased cuing o locate on R. Pt completes 3 sit to stands with MIN A +2 holding opposite hand with tactile cues for uprigh posture and mirror for visual feedback. R knee block provided by OT to prevent buckling exited session with pt  seated in TIS, belt alarm on, lap tray fastened and pt at RN station  Therapy Documentation Precautions:  Precautions Precautions: Fall, Other (comment) Precaution Comments: pusher, right hemiparesis Restrictions Weight Bearing Restrictions: No General:   Vital Signs:   Pain: Pain Assessment Pain Score: 0-No pain ADL:   Vision   Perception    Praxis   Exercises:   Other Treatments:     Therapy/Group: Individual Therapy  Tonny Branch 08/05/2019, 2:17 PM

## 2019-08-05 NOTE — Patient Care Conference (Signed)
Inpatient RehabilitationTeam Conference and Plan of Care Update Date: 08/05/2019   Time: 10:10 AM    Patient Name: Melanie Madden      Medical Record Number: [id number]  Date of Birth: 11/16/1965 Sex: Female         Room/Bed: 4W20C/4W20C-01 Payor Info: Payor: CIG[employer] / Plan: CIGNA MANAGED / Product Type: *No Product type* /    Admit Date/Time:  07/18/2019  2:02 PM  Primary Diagnosis:  Dysphagia  Patient Active Problem List   Diagnosis Date Noted  . Dysphagia 07/29/2019  . 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 Hypernatremia   . Leukocytosis   . Essential hypertension   . Global aphasia   . 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) 07/10/2019  . ICH (intracerebral hemorrhage) ([place]) 07/09/2019  . Anxiety state 02/21/2015  . Cephalalgia 12/21/2014    Expected Discharge Date: Expected Discharge Date: 08/21/19  Team Members Present: Physician leading conference: Dr. Alysia Penna Social Worker Present: Lennart Pall, LCSW Nurse Present: Serena Croissant, LPN Case Manager: Karene Fry, RN PT Present: Barrie Folk, PT;Rosita Dechalus, PTA OT Present: Laverle Hobby, OT SLP Present: Weston Anna, SLP PPS Coordinator present : Gunnar Fusi, Novella Olive, PT     Current Status/Progress Goal Weekly Team Focus  Bowel/Bladder   patient is incontinent of bowel and bladder  continue to work on regaining continence  q 2 hrs toileting   Swallow/Nutrition/ Hydration   dysphagia 2 with thin via cup, Max to emerging Mod A for impulsivity  Min A with least restrictive diet  slow rate, trials of dysphagia 3   ADL's   Max A bathing at shower level or bed level. Mod A UB dressing, +2 LB dressing using stedy. +2 for toileting and toilet transfers using stedy  Mod A overall  NMR, functional cognition, functional transfers, balance, ADL retraining, praxis, caregiver edu   Mobility   minA rolling         Communication   Total A with frequent inability to produce voicing or attempts to  communicate via gestures, facial expression  Max A - Downgraded  vowel imitation, appropriate (nonaggitated) ways to communicate wants and needs   Safety/Cognition/ Behavioral Observations  Max A for most basic tasks  Mod A to Max A (downgraded to possible Max A )  basic problem solving (she attempted to eat lotion on her hand), sustained attention to task   Pain   patient is non-verbal. No facial expression or moaning sound to indicate pain when repositioning.  patient will remain pain free  q shift and PRN pain assessment   Skin   skin intact, unresolve MASD due to heavy incontinence  free from skin breakdown  q shift and PRN skin assessment    Rehab Goals Patient on target to meet rehab goals: Yes *See Care Plan and progress notes for long and short-term goals.     Barriers to Discharge  Current Status/Progress Possible Resolutions Date Resolved   Nursing                  PT                    OT                  SLP                SW                Discharge Planning/Teaching Needs:  Husband has been  here mostly in the evenigns, if needs to come in will for therapies. Was going to wait until closer to DC to do this. Has started process of hiring caregivers  May need to go ahead and have husband come in to begin teaching.   Team Discussion: R hemi, global aphasia, started Ritalin.  RN good spirits, timed toileting needed.  OT max +2 self care and transfers, downgrade to mod a goals.  Husband concerned and wants longer LOS per SW.  PT more consistency, needs cues, lateral lean, slow/steady progress, mod a goals.  SLP non verbal, tot A communication, low frustration tolerance, language biggest frustration, D2thin, trials D3, impulsive, decreased attention.  From [place], ?try interpreter in [place] to see if it helps with communication?   Revisions to Treatment Plan: N/A     Medical Summary Current Status: Globally aphasic and apraxic, poor attention, Weekly Focus/Goal:  Ritalin trial for attention and concentration  Barriers to Discharge: Medical stability;Home enviroment access/layout   Possible Resolutions to Barriers: Off blood pressure medications, monitoring heart rate on Ritalin   Continued Need for Acute Rehabilitation Level of Care: The patient requires daily medical management by a physician with specialized training in physical medicine and rehabilitation for the following reasons: Direction of a multidisciplinary physical rehabilitation program to maximize functional independence : Yes Medical management of patient stability for increased activity during participation in an intensive rehabilitation regime.: Yes Analysis of laboratory values and/or radiology reports with any subsequent need for medication adjustment and/or medical intervention. : Yes   I attest that I was present, lead the team conference, and concur with the assessment and plan of the team.   Retta Diones 08/05/2019, 2:36 PM   Team conference was held via web/ teleconference due to [place] - 19

## 2019-08-06 ENCOUNTER — Inpatient Hospital Stay (HOSPITAL_COMMUNITY): Payer: Managed Care, Other (non HMO) | Admitting: Physical Therapy

## 2019-08-06 ENCOUNTER — Inpatient Hospital Stay (HOSPITAL_COMMUNITY): Payer: Managed Care, Other (non HMO)

## 2019-08-06 NOTE — Progress Notes (Signed)
Occupational Therapy Session Note  Patient Details  Name: Melanie Madden MRN: [id number] Date of Birth: Dec 23, 1965  Today's Date: 08/06/2019 OT Individual Time: 0900-1000 OT Individual Time Calculation (min): 60 min     Short Term Goals: Week 2:  OT Short Term Goal 1 (Week 2): Pt will complete OOB toileting with 2 assist and LRAD OT Short Term Goal 1 - Progress (Week 2): Met OT Short Term Goal 2 (Week 2): Pt will complete 1 grooming task at the sink with Mod A OT Short Term Goal 2 - Progress (Week 2): Met OT Short Term Goal 3 (Week 2): Pt will exhibit improved Rt attention by positioning her affected UE safely during mobility with mod cuing OT Short Term Goal 3 - Progress (Week 2): Met Week 3:  OT Short Term Goal 1 (Week 3): STGs=LTGs due to ELOS  Skilled Therapeutic Interventions/Progress Updates:    1;1. Pt received in bed agreeable to OT. Pt completes supine>sitting with MAX A for LE and trunk management. Pt maintains static sitting balance seated EOB awaiting pants arrival with S-CGA. OT thread RLE and pt thread LLE sitting EOB with MAX A for sitting baalnce. Sit to stand with MOD A overall to maintain balance. Pt completes standing in standing frame with S-CGA for trunk control while pt completes self ROM exercises with demo/HOH A for instruction for R attention. Pt completes seated dynavision with 1.1 seconds to locate stimulus on L and 3.1 on R. Exited session with pt seated in w/c, call light in reach and all needs met  Therapy Documentation Precautions:  Precautions Precautions: Fall, Other (comment) Precaution Comments: pusher, right hemiparesis Restrictions Weight Bearing Restrictions: No General:   Vital Signs:   Pain:   ADL:   Vision   Perception    Praxis   Exercises:   Other Treatments:     Therapy/Group: Individual Therapy  Tonny Branch 08/06/2019, 9:59 AM

## 2019-08-06 NOTE — Progress Notes (Signed)
Physical Therapy Session Note  Patient Details  Name: Melanie Madden MRN: [id number] Date of Birth: 1965/11/05  Today's Date: 08/06/2019 PT Individual Time: 1400-1505 PT Individual Time Calculation (min): 65 min   Short Term Goals:  Week 3:  PT Short Term Goal 1 (Week 3): Pt will transfer with max assist of 1 consistently PT Short Term Goal 2 (Week 3): PT will propell WC 53f with mod assist using hemi technique PT Short Term Goal 3 (Week 3): Pt will ambulate at rail in hall x 152fwith max assist +2  Skilled Therapeutic Interventions/Progress Updates:   Pt received sitting in WC and noted to be leaned severely to the R with poor pelvic positioning in TIS WC. Max assist from PT to improve positioning in WC. Pt then agreeable to PT. Pt transported to rehab gym SB transfer to mat table on the R with mod assist. Sitting balance EOB x 5 mintues with min assist to max assist when pt impulsively adjusting sitting position at EOM. Mod assist SB transfer to standard WC on the R with max cues for initiation, set up and safety through transfer.   PT instructed pt in WC mobility through hall with max assist for coordination of LUE and LLE as well as facilitation to the R side trunk to prevent loss of balance and fall 2/2 lateral/forward lean.   kinetron seated reciprocal movement 3 bouts x 30-45 seconds with max assist to faciliate actviaiton of the RLE. Pt noted to have intermittent RLE activation into extension throughout reciprocal movement training. Throughout treatment, pt noticible irritated. PT then asked pt if she wanted to sit on toilet for BM. Pt nodded. Patient returned to room and [facility] with + 2 assist for safety due to impulsivity. Pt had been in continent and voided while on toilet. Mod assist for balance in standing in stedy and sitting on toilet, and + 2 assist for pericare.   Pt then performed stedy transfer to bed with + 2 assist for safety from  bathroom. Sit>supine completed with mod assist for RUE/RLE management. Left supine in bed with call bell in reach and all needs met.         Therapy Documentation Precautions:  Precautions Precautions: Fall, Other (comment) Precaution Comments: pusher, right hemiparesis Restrictions Weight Bearing Restrictions: No Vital Signs: Therapy Vitals Temp: 98.8 F (37.1 C) Pulse Rate: (!) 106 Resp: 18 BP: (!) 146/99 Patient Position (if appropriate): Sitting Oxygen Therapy SpO2: 100 % O2 Device: Room Air Pain: Pain Assessment Pain Score: 0-No pain   Therapy/Group: Individual Therapy  AuLorie Phenix/01/2020, 3:26 PM

## 2019-08-06 NOTE — Progress Notes (Signed)
Patient communicates more using facial expression and nodding this shift. Patient used left hand to signal the need to raise the [facility] and demonstrates caution when drinking water.

## 2019-08-06 NOTE — Progress Notes (Signed)
Speech Language Pathology Daily Session Note  Patient Details  Name: Jesenya Frederico MRN: [id number] Date of Birth: [birth year]  Today's Date: 08/06/2019 SLP Individual Time: 1105-1205 SLP Individual Time Calculation (min): 60 min  Short Term Goals: Week 3: SLP Short Term Goal 1 (Week 3): Pt will consume Dys2/thin diet with min overt s/sx aspiration, efficient mastication, oral clearance and slow rate with Min A multimodal cues. SLP Short Term Goal 2 (Week 3): Pt will consume Dys 3 trials with min s/s aspiration, efficient mastictaion and oral clearance with liquid wash with Min A verbal cues over 3 sessisons prior to upgrade. SLP Short Term Goal 3 (Week 3): Pt will express wants/needs via multimodal means (gestures, facial expression and communication board) with 60% accuracy with mod A verbal/visual cues. SLP Short Term Goal 4 (Week 3): Pt will imitate vowel sounds with 75% accuracy given Max A multimodal cues. SLP Short Term Goal 5 (Week 3): Pt will sustain attention to tasks for 2 minute intervals with Max A multimodal cues for redirection.  Skilled Therapeutic Interventions: Skilled ST services focused on swallow and language skills. As of note piror to treatment session SLP passed by pt behind nursing station, pt grabbed for cup in SLP's hand. Pt confirmed via head nod that she was thirsty and SLP provided water via cup x2 with continued gesture response to yes/no questions. SLP facilitated comprehension of two lined drawling picture (eat and drink) paired with lunch time dys 2 /thin via cup tray. Pt was required to select eat or drink picture and was presented with selected choice, pt confirmed desired item select with facial expressions and nod for yes, demonstrating appropriately 70% accuracy. SLP taped eat and drink picture to pt's lap tray to aid in expressing wants/needs to staff members. Pt continues to consume large solid bolus' however swallow function appeared timely and functional. Pt  demonstrated ability to consume small sips via cup with min A verbal cues and no overt s/s aspiration on solids/liquids. Pt's brief was wet and pt confirmed wet brief in reponse to yes/no questions via head nod and SLP supplemented communication with wet verse dry brief pictures. Pt selected bed verse toilet picture. SLP and NT attempted to transfer pt to bed to change brief, however pt became combative, pushing SLP away and grabbing lap tray, appearing to express desire to keep lap tray on despite max A (verbal/visual with pictures) communication/explaination from SLP. Pt was left with NT and requested assistance from nurse to transfer. Pt was left in room with call bell within reach and chair alarm set. ST recommends to continue skilled ST services.      Pain Pain Assessment Pain Score: 0-No pain  Therapy/Group: Individual Therapy  Maisyn Nouri  [facility] 08/06/2019, 12:23 PM

## 2019-08-06 NOTE — Progress Notes (Signed)
[place] PHYSICAL MEDICINE & REHABILITATION PROGRESS NOTE  Subjective/Complaints:  Remains globally aphasic but looks brighter     ROS: limited due to language/communication   Objective: Vital Signs: Blood pressure 125/84, pulse 80, temperature 97.9 F (36.6 C), resp. rate 17, height 5\' 7"  (1.702 m), weight 57.7 kg, SpO2 100 %. No results found. No results for input(s): WBC, HGB, HCT, PLT in the last 72 hours. No results for input(s): NA, K, CL, CO2, GLUCOSE, BUN, CREATININE, CALCIUM in the last 72 hours.  Physical Exam: BP 125/84 (BP Location: Left Arm)   Pulse 80   Temp 97.9 F (36.6 C)   Resp 17   Ht 5\' 7"  (1.702 m)   Wt 57.7 kg   SpO2 100%   BMI 19.92 kg/m  Constitutional: No distress . Vital signs reviewed. Lying in bed calmly.  HEENT: EOMI, oral membranes moist Neck: supple Cardiovascular: RRR without murmur. No JVD    Respiratory: CTA Bilaterally without wheezes or rales. Normal effort    GI: BS +, non-tender, non-distended  Skin: Warm and dry.  Intact. Musc: No edema in extremities.  No tenderness in extremities. Neuro: Alert Smiles and makes good eye contact during communication. Blinks in response.  Globally aphasic,  Exam limited due to lack of ability to follow commands, however seen spontaneously moving LUE and LLE appears to sense pain on right side, unchanged 0/5 RUE. RLE tr to 1/5 HE, 0/5 distally Tone reduced on Right side  Skin: Skin is warm and dry.  Psychiatric:  flat  Assessment/Plan: 1. Functional deficits secondary to left basal ganglia hypertensive hemorrhage which require 3+ hours per day of interdisciplinary therapy in a comprehensive inpatient rehab setting.  Physiatrist is providing close team supervision and 24 hour management of active medical problems listed below.  Physiatrist and rehab team continue to assess barriers to discharge/monitor patient progress toward functional and medical goals  Care Tool:  Bathing    Body  parts bathed by patient: Face, Chest, Abdomen, Front perineal area   Body parts bathed by helper: Right arm, Left arm, Buttocks, Right upper leg, Left upper leg, Right lower leg, Left lower leg     Bathing assist Assist Level: Maximal Assistance - Patient 24 - 49%     Upper Body Dressing/Undressing Upper body dressing   What is the patient wearing?: Pull over shirt    Upper body assist Assist Level: Moderate Assistance - Patient 50 - 74%    Lower Body Dressing/Undressing Lower body dressing      What is the patient wearing?: Pants, Incontinence brief     Lower body assist Assist for lower body dressing: 2 Helpers     Toileting Toileting    Toileting assist Assist for toileting: 2 Helpers     Transfers Chair/bed transfer  Transfers assist  Chair/bed transfer activity did not occur: Safety/medical concerns  Chair/bed transfer assist level: 2 Helpers     Locomotion Ambulation   Ambulation assist   Ambulation activity did not occur: Safety/medical concerns  Assist level: 2 helpers Assistive device: (rail in hall) Max distance: 12   Walk 10 feet activity   Assist  Walk 10 feet activity did not occur: Safety/medical concerns  Assist level: 2 helpers Assistive device: Other (comment)(rail in hall)   Walk 50 feet activity   Assist Walk 50 feet with 2 turns activity did not occur: Safety/medical concerns         Walk 150 feet activity   Assist Walk 150 feet activity did  not occur: Safety/medical concerns         Walk 10 feet on uneven surface  activity   Assist Walk 10 feet on uneven surfaces activity did not occur: Safety/medical concerns         Wheelchair     Assist Will patient use wheelchair at discharge?: Yes Type of Wheelchair: Manual    Wheelchair assist level: Dependent - Patient 0% Max wheelchair distance: 150 ft    Wheelchair 50 feet with 2 turns activity    Assist        Assist Level: Dependent - Patient  0%   Wheelchair 150 feet activity     Assist     Assist Level: Dependent - Patient 0%      Medical Problem List and Plan: 1.  Right side weakness and aphasia secondary to left basal ganglia hypertensive hemorrhage  CIR PT, OT , SLP-tent d/c 1/15 May be responding to   methylphenidate  . 2.  Antithrombotics: -DVT/anticoagulation: Subcutaneous heparin initiated 07/14/2019.  Monitor for any bleeding episodes- change to enoxaparin to reduce freq of injection -1/4 normal PLT             -antiplatelet therapy: N/A  3. Pain Management: Tylenol as needed 4. Mood: Provide emotional support             -antipsychotic agents: N/A 5. Neuropsych: This patient is not capable of making decisions on her own behalf. 6. Skin/Wound Care: Routine skin checks 7. Fluids/Electrolytes/Nutrition: Routine in and outs. BUN/Cr normal po intake ~663ml per day recorded  8.  Hypertension.  Lisinopril 20 mg twice daily, Norvasc 10 mg daily.  On hold, normotensive             Vitals:   08/05/19 2012 08/06/19 0558  BP: 138/72 125/84  Pulse: 88 80  Resp: 18 17  Temp: 98 F (36.7 C) 97.9 F (36.6 C)  SpO2: 98% 100%    1/1: BP still a little soft check orthostatic vitals if there is a drop may need IVF bolus  Tachycardia has resolved - monitor on methylphenidate  9.  Hypernatremia-likely due to hypertonic saline             resolved Na 133 10.  Leukocytosis             Resolved  11.  Transaminasemia- no recent tylenol use isolated to ALT, abd exam is normal - some  ETOH use prior to [place] 1-2 drinks per day  12.  Aphasia as per husband native language is Portugese, had a nurse on acute that spoke and pt seemed to respond better, discussed that apahsic pts may have better comprehension of native language vs languages learned later 58. E coli [id number] 14. Disposition:moved DC date from 1/15 to 1/22.Attention improving with methylphenidate which is allowing pt to participate and progress toward goals  still +2 Max with goals of mod A Husband will be with patient upon discharge and will hire caregivers for her when he returns to work.    LOS: 19 days A FACE TO [place] 08/06/2019, 8:25 AM

## 2019-08-07 ENCOUNTER — Inpatient Hospital Stay (HOSPITAL_COMMUNITY): Payer: Managed Care, Other (non HMO) | Admitting: Physical Therapy

## 2019-08-07 ENCOUNTER — Inpatient Hospital Stay (HOSPITAL_COMMUNITY): Payer: Managed Care, Other (non HMO) | Admitting: Occupational Therapy

## 2019-08-07 ENCOUNTER — Inpatient Hospital Stay (HOSPITAL_COMMUNITY): Payer: Managed Care, Other (non HMO) | Admitting: Speech Pathology

## 2019-08-07 NOTE — Progress Notes (Signed)
Physical Therapy Session Note  Patient Details  Name: Melanie Madden MRN: [id number] Date of Birth: [birth year]  Today's Date: 08/07/2019 PT Individual Time: 0905-1000 PT Individual Time Calculation (min): 55 min   Short Term Goals: Week 3:  PT Short Term Goal 1 (Week 3): Pt will transfer with max assist of 1 consistently PT Short Term Goal 2 (Week 3): PT will propell WC 56ft with mod assist using hemi technique PT Short Term Goal 3 (Week 3): Pt will ambulate at rail in hall x 21ft with max assist +2  Skilled Therapeutic Interventions/Progress Updates:    Pt received supine in bed and agreeable to PT. Pt nodding yes to need to use toilet. Supine>sit transfer with max assist for RUE and RLE management.  Sit<>stand in stedy with mod assist and max cues for safety to decrease impulsivity and improve set up. Pt noted to have no WB through RLE when initiating transfer, PT PT required to fix once in standing. Pt transported to [facility] over toilet with assist of 1 in stedy. Sit<>stand x 3 in stedy for peri care and to doff/don pants and clean brief.   Pt transferred to [facility] in stedy. WC mobility training with mod assist and use of LLE only to propel 1103ft. Max multimodal cues initially to engage foot to floor to allow propulsion, but able to maintain through remainder of [place] training.   Sit<>stand at rail with max assist to stabilize the RLE and support trunk over therapist shoulder. Gait training in hall at rail 2 x 84ft with total A +2 for WC follow and RLE management to prrevent buckling and to advance; intermittent trace activation in extension, but very inconsistent. No active hip flexion or knee flexion noted.   Patient returned to room and left sitting in [facility] at RN station, after pt refusing to transfer to [place] WC/ All needs within reach and NT/unit secretary aware of pt position in standard WC.          Therapy Documentation Precautions:  Precautions Precautions: Fall, Other (comment) Precaution  Comments: pusher, right hemiparesis Restrictions Weight Bearing Restrictions: No Pain:   Faces: none.    Therapy/Group: Individual Therapy  Lorie Phenix 08/07/2019, 10:07 AM

## 2019-08-07 NOTE — Progress Notes (Signed)
Social Work Patient ID: Melanie Madden, female   DOB: [birth year], 54 y.o.   MRN: [id number]  Met with pt and spouse following team conference.  Pt remains non-verbal and appeared frustrated while spouse and I talked.  He made several attempts to console her, however, she kept pushing his hands away.   Spouse aware that targeted dc date changed to 1/22 with mod assistance goals still overall.  He continues to work on arranging private duty caregivers and remains very committed.  Continue to follow.  Davidmichael Zarazua, LCSW

## 2019-08-07 NOTE — Progress Notes (Signed)
Speech Language Pathology Daily Session Note  Patient Details  Name: Melanie Madden MRN: [id number] Date of Birth: May 19, 1966  Today's Date: 08/07/2019 SLP Individual Time: [id number] SLP Individual Time Calculation (min): 60 min  Short Term Goals: Week 3: SLP Short Term Goal 1 (Week 3): Pt will consume Dys2/thin diet with min overt s/sx aspiration, efficient mastication, oral clearance and slow rate with Min A multimodal cues. SLP Short Term Goal 2 (Week 3): Pt will consume Dys 3 trials with min s/s aspiration, efficient mastictaion and oral clearance with liquid wash with Min A verbal cues over 3 sessisons prior to upgrade. SLP Short Term Goal 3 (Week 3): Pt will express wants/needs via multimodal means (gestures, facial expression and communication board) with 60% accuracy with mod A verbal/visual cues. SLP Short Term Goal 4 (Week 3): Pt will imitate vowel sounds with 75% accuracy given Max A multimodal cues. SLP Short Term Goal 5 (Week 3): Pt will sustain attention to tasks for 2 minute intervals with Max A multimodal cues for redirection.  Skilled Therapeutic Interventions:  Skilled treatment session focused on pt expressing wants/needs. Pt in wheelchair and upon entering her room, she pushed herself beside her bed, moved the floor mat and located call light. She further put call light in her lap and then untangled cable from bed. She turned herself to face TV. SLP preceived that pt wanted to get into bed and retrieved [place] out of bathroom. Pt pushed St[place] out of the way and turned on the TV, gestured for SLP to sit on the bed beside pt. Pt intermittently held onto SLP hand. She also impulsively pressed on/off button and demonstrated facial regret. She was able to go forward and "back to" channels.  While pt was engaged in TV, SLP requested that pt leave TV on certain stations for more than 2 to 3 minutes to increase pt's sustained attention and flexibility to someone's else requests. Pt required  Mod A cues to leave on channels that she didn't appear to prefer. NT consulted PT who advises that pt can be left upright in wheelchair with full lap tray in place. Pt pushed the tray away and pushed SLP in stomach. After strong education to pt that touching me was not appropriate pt lifted her arm for SLP to place full lap tray. Pt left in wheelchair, lap belt alarm on, full lap tray in place, call light in hand and pt positioned further forward in room for easy visualization by staff walking by. Nursing aware of pt in room.  Pt with much improved ability to demonstrate wants and needs, alter behavior and demonstrate flexibility/sustained attention to tasks that she didn't desire.      Pain    Therapy/Group: Individual Therapy  Dorice Stiggers 08/07/2019, 2:50 PM

## 2019-08-07 NOTE — Progress Notes (Signed)
[place] PHYSICAL MEDICINE & REHABILITATION PROGRESS NOTE  Subjective/Complaints:  Remains globally aphasic   ROS: limited due to language/communication   Objective: Vital Signs: Blood pressure 133/87, pulse 92, temperature 98.4 F (36.9 C), resp. rate 18, height 5\' 7"  (1.702 m), weight 57.7 kg, SpO2 98 %. No results found. No results for input(s): WBC, HGB, HCT, PLT in the last 72 hours. No results for input(s): NA, K, CL, CO2, GLUCOSE, BUN, CREATININE, CALCIUM in the last 72 hours.  Physical Exam: BP 133/87 (BP Location: Right Arm)   Pulse 92   Temp 98.4 F (36.9 C)   Resp 18   Ht 5\' 7"  (1.702 m)   Wt 57.7 kg   SpO2 98%   BMI 19.92 kg/m  Constitutional: No distress . Vital signs reviewed. Lying in bed calmly.  HEENT: EOMI, oral membranes moist Neck: supple Cardiovascular: RRR without murmur. No JVD    Respiratory: CTA Bilaterally without wheezes or rales. Normal effort    GI: BS +, non-tender, non-distended  Skin: Warm and dry.  Intact. Musc: No edema in extremities.  No tenderness in extremities. Neuro: Alert Smiles and makes good eye contact during communication.  Globally aphasic,  Exam limited due to lack of ability to follow commands, however seen spontaneously moving LUE and LLE appears to sense pain on right side, unchanged 0/5 RUE. RLE tr to 1/5 HE, 0/5 distally Tone reduced on Right side  Skin: Skin is warm and dry.  Psychiatric:  flat  Assessment/Plan: 1. Functional deficits secondary to left basal ganglia hypertensive hemorrhage which require 3+ hours per day of interdisciplinary therapy in a comprehensive inpatient rehab setting.  Physiatrist is providing close team supervision and 24 hour management of active medical problems listed below.  Physiatrist and rehab team continue to assess barriers to discharge/monitor patient progress toward functional and medical goals  Care Tool:  Bathing    Body parts bathed by patient: Face, Chest, Abdomen,  Front perineal area   Body parts bathed by helper: Right arm, Left arm, Buttocks, Right upper leg, Left upper leg, Right lower leg, Left lower leg     Bathing assist Assist Level: Maximal Assistance - Patient 24 - 49%     Upper Body Dressing/Undressing Upper body dressing   What is the patient wearing?: Pull over shirt    Upper body assist Assist Level: Moderate Assistance - Patient 50 - 74%    Lower Body Dressing/Undressing Lower body dressing      What is the patient wearing?: Pants, Incontinence brief     Lower body assist Assist for lower body dressing: 2 Helpers     Toileting Toileting    Toileting assist Assist for toileting: 2 Helpers     Transfers Chair/bed transfer  Transfers assist  Chair/bed transfer activity did not occur: Safety/medical concerns  Chair/bed transfer assist level: 2 Helpers     Locomotion Ambulation   Ambulation assist   Ambulation activity did not occur: Safety/medical concerns  Assist level: 2 helpers Assistive device: (rail in hall) Max distance: 12   Walk 10 feet activity   Assist  Walk 10 feet activity did not occur: Safety/medical concerns  Assist level: 2 helpers Assistive device: Other (comment)(rail in hall)   Walk 50 feet activity   Assist Walk 50 feet with 2 turns activity did not occur: Safety/medical concerns         Walk 150 feet activity   Assist Walk 150 feet activity did not occur: Safety/medical concerns  Walk 10 feet on uneven surface  activity   Assist Walk 10 feet on uneven surfaces activity did not occur: Safety/medical concerns         Wheelchair     Assist Will patient use wheelchair at discharge?: Yes Type of Wheelchair: Manual    Wheelchair assist level: Dependent - Patient 0% Max wheelchair distance: 150 ft    Wheelchair 50 feet with 2 turns activity    Assist        Assist Level: Dependent - Patient 0%   Wheelchair 150 feet activity      Assist     Assist Level: Dependent - Patient 0%      Medical Problem List and Plan: 1.  Right side weakness and aphasia secondary to left basal ganglia hypertensive hemorrhage  CIR PT, OT , SLP-tent d/c 1/15 May be responding to   methylphenidate - appearing brighter . 2.  Antithrombotics: -DVT/anticoagulation: Subcutaneous heparin initiated 07/14/2019.  Monitor for any bleeding episodes- change to enoxaparin to reduce freq of injection -1/4 normal PLT             -antiplatelet therapy: N/A  3. Pain Management: Tylenol as needed 4. Mood: Provide emotional support             -antipsychotic agents: N/A 5. Neuropsych: This patient is not capable of making decisions on her own behalf. 6. Skin/Wound Care: Routine skin checks 7. Fluids/Electrolytes/Nutrition: Routine in and outs. BUN/Cr normal po intake ~617ml per day recorded  8.  Hypertension.  Lisinopril 20 mg twice daily, Norvasc 10 mg daily.  On hold, normotensive             Vitals:   08/06/19 1321 08/06/19 1947  BP: (!) 146/99 133/87  Pulse: (!) 106 92  Resp: 18 18  Temp: 98.8 F (37.1 C) 98.4 F (36.9 C)  SpO2: 100% 98%     Tachycardia - some elevation on  methylphenidate will monitor 9.  Hypernatremia-likely due to hypertonic saline             resolved Na 133 10.  Leukocytosis             Resolved  11.  Transaminasemia- no recent tylenol use isolated to ALT, abd exam is normal - some  ETOH use prior to [place] 1-2 drinks per day  12.  Aphasia as per husband native language is Portugese, had a nurse on acute that spoke and pt seemed to respond better, discussed that apahsic pts may have better comprehension of native language vs languages learned later  [place] date from 1/15 to 1/22.Attention improving with methylphenidate which is allowing pt to participate and progress toward goals still +2 Max with goals of mod A Husband will be with patient upon discharge and will hire caregivers for her when he  returns to work.    LOS: 20 days A FACE TO FACE EVALUATION WAS PERFORMED  Charlett Blake 08/07/2019, 8:04 AM

## 2019-08-07 NOTE — Progress Notes (Signed)
Occupational Therapy Session Note  Patient Details  Name: Denique Eidem MRN: [id number] Date of Birth: [birth year]  Today's Date: 08/07/2019 OT Individual Time: 1101-1201 OT Individual Time Calculation (min): 60 min   Short Term Goals: Week 3:  OT Short Term Goal 1 (Week 3): STGs=LTGs due to ELOS  Skilled Therapeutic Interventions/Progress Updates:    Pt greeted at RN station with no s/s pain. Saebo stim one applied to R UE at start of session and removed at session closed with no signs of redness on skin. Saebo parameters are listed below. Started with oral care in her room, pt standing at sink with Max A of 1 for balance to prevent Rt lateral and forward LOB, Rt knee blocked. 2nd helper set up pts toothbrush and provided [facility] assist so pt could brush her teeth. She initiated sitting down when fatigued. When set up with cup to rinse and cued to spit, pt swallows. Set her up with hand sanitizer with pt trying to consume it orally. HOH for using sanitizer correctly. She was then escorted to the dayroom via w/c. Worked on [employer], Rt attention and visual scanning with use of mirror, standing or sitting in front of high-low table. Pt visually attending to her R UE, using active assist techniques with supervision or min facilitation for joint protection when reaching forward or laterally towards the Lt to offset pushing tendencies. She was reaching for and "squeezing" a small ball. Note she had several seated LOBs in her w/c (laterally towards Rt and forward) with no awareness from pt to correct. Pt initiated looking into the full length mirror and lifting her R UE by the wrist, a small smile on her face. Pt able to tolerate standing with max A for a few minutes and initiated sitting when fatigued. At end of session, pt nodded when asked if she wanted to remain up in the w/c for lunch. Left her at the RN station with safety belt fastened and half lap tray applied to w/c.    Saebo Stim One 330 pulse width 35  Hz pulse rate On 8 sec/ off 8 sec Ramp up/ down 2 sec Symmetrical Biphasic wave form  Max intensity 183mA at 500 Ohm load   Therapy Documentation Precautions:  Precautions Precautions: Fall, Other (comment) Precaution Comments: pusher, right hemiparesis Restrictions Weight Bearing Restrictions: No   :   Pain: No s/s pain during tx   ADL:       Therapy/Group: Individual Therapy  Floye Fesler A Aryella Besecker 08/07/2019, 12:44 PM

## 2019-08-08 ENCOUNTER — Inpatient Hospital Stay (HOSPITAL_COMMUNITY): Payer: Managed Care, Other (non HMO) | Admitting: Occupational Therapy

## 2019-08-08 NOTE — Progress Notes (Signed)
Occupational Therapy Session Note  Patient Details  Name: Melanie Madden MRN: [id number] Date of Birth: Oct 01, 1965  Today's Date: 08/08/2019 OT Individual Time: 1300-1330 OT Individual Time Calculation (min): 30 min   Short Term Goals: Week 3:  OT Short Term Goal 1 (Week 3): STGs=LTGs due to ELOS  Skilled Therapeutic Interventions/Progress Updates:    Pt greeted sitting upright in bed eating lunch with nurse tech. Handoff to OT to finish eating. Pt needed verbal cues to clear mouth before taking next bite. Max A to thread pants at bed level, then OT positioning R LE in knee flexion, and pt able to bridge hips to pull pants up. Pt then came to sitting EOB with mod A. 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 OT had pt lean down onto L elbow with some improved sitting balance. UB dressing at EOB with max A. Max A slideboard transfer to wc on L side. OT placed full lap tray and alarm belt on pt. OT set pt up with SAEBO e-stim system for 60 minutes. OT returned to remove SAEBO with no skin irritation or adverse reactions noted.   Therapy Documentation Precautions:  Precautions Precautions: Fall, Other (comment) Precaution Comments: pusher, right hemiparesis Restrictions Weight Bearing Restrictions: No Pain: No pain   Therapy/Group: Individual Therapy  Valma Cava 08/08/2019, 1:11 PM

## 2019-08-09 ENCOUNTER — Inpatient Hospital Stay (HOSPITAL_COMMUNITY): Payer: Managed Care, Other (non HMO) | Admitting: Speech Pathology

## 2019-08-09 NOTE — Progress Notes (Signed)
Nurse was notified of by OT that patient hit her arm and a skin tear was noticed on her right elbow while participating in therapy. Area was assessed and clean, foam was placed. No pain noted no other abnormal findings noted. Husband was at patient side and aware of issue.Will continue to monitor and assess q shift

## 2019-08-09 NOTE — Progress Notes (Signed)
Post fall VS

## 2019-08-09 NOTE — Progress Notes (Signed)
[place] PHYSICAL MEDICINE & REHABILITATION PROGRESS NOTE  Subjective/Complaints:  Remains globally aphasic   ROS: limited due to language/communication   Objective: Vital Signs: Blood pressure 127/84, pulse 79, temperature 98.2 F (36.8 C), resp. rate 20, height 5\' 7"  (1.702 m), weight 57.7 kg, SpO2 99 %. No results found. No results for input(s): WBC, HGB, HCT, PLT in the last 72 hours. No results for input(s): NA, K, CL, CO2, GLUCOSE, BUN, CREATININE, CALCIUM in the last 72 hours.  Physical Exam: BP 127/84 (BP Location: Left Arm)   Pulse 79   Temp 98.2 F (36.8 C)   Resp 20   Ht 5\' 7"  (1.702 m)   Wt 57.7 kg   SpO2 99%   BMI 19.92 kg/m  Constitutional: No distress . Vital signs reviewed. Lying in bed calmly.  HEENT: EOMI, oral membranes moist Neck: supple Cardiovascular: RRR without murmur. No JVD    Respiratory: CTA Bilaterally without wheezes or rales. Normal effort    GI: BS +, non-tender, non-distended  Skin: Warm and dry.  Intact. Musc: No edema in extremities.  No tenderness in extremities. Neuro: Alert Smiles and makes good eye contact during communication.  Globally aphasic,  Exam limited due to lack of ability to follow commands, however seen spontaneously moving LUE and LLE appears to sense pain on right side, unchanged 0/5 RUE. RLE tr to 1/5 HE, 0/5 distally Tone reduced on Right side  Skin: Skin is warm and dry.  Psychiatric:  flat  Assessment/Plan: 1. Functional deficits secondary to left basal ganglia hypertensive hemorrhage which require 3+ hours per day of interdisciplinary therapy in a comprehensive inpatient rehab setting.  Physiatrist is providing close team supervision and 24 hour management of active medical problems listed below.  Physiatrist and rehab team continue to assess barriers to discharge/monitor patient progress toward functional and medical goals  Care Tool:  Bathing    Body parts bathed by patient: Face, Chest, Abdomen,  Front perineal area   Body parts bathed by helper: Right arm, Left arm, Buttocks, Right upper leg, Left upper leg, Right lower leg, Left lower leg     Bathing assist Assist Level: Maximal Assistance - Patient 24 - 49%     Upper Body Dressing/Undressing Upper body dressing   What is the patient wearing?: Pull over shirt    Upper body assist Assist Level: Moderate Assistance - Patient 50 - 74%    Lower Body Dressing/Undressing Lower body dressing      What is the patient wearing?: Pants, Incontinence brief     Lower body assist Assist for lower body dressing: 2 Helpers     Toileting Toileting    Toileting assist Assist for toileting: 2 Helpers     Transfers Chair/bed transfer  Transfers assist  Chair/bed transfer activity did not occur: Safety/medical concerns  Chair/bed transfer assist level: 2 Helpers     Locomotion Ambulation   Ambulation assist   Ambulation activity did not occur: Safety/medical concerns  Assist level: 2 helpers Assistive device: (rail in hall) Max distance: 12   Walk 10 feet activity   Assist  Walk 10 feet activity did not occur: Safety/medical concerns  Assist level: 2 helpers Assistive device: Other (comment)(rail in hall)   Walk 50 feet activity   Assist Walk 50 feet with 2 turns activity did not occur: Safety/medical concerns         Walk 150 feet activity   Assist Walk 150 feet activity did not occur: Safety/medical concerns  Walk 10 feet on uneven surface  activity   Assist Walk 10 feet on uneven surfaces activity did not occur: Safety/medical concerns         Wheelchair     Assist Will patient use wheelchair at discharge?: Yes Type of Wheelchair: Manual    Wheelchair assist level: Dependent - Patient 0% Max wheelchair distance: 150 ft    Wheelchair 50 feet with 2 turns activity    Assist        Assist Level: Dependent - Patient 0%   Wheelchair 150 feet activity      Assist     Assist Level: Dependent - Patient 0%      Medical Problem List and Plan: 1.  Right side weakness and aphasia secondary to left basal ganglia hypertensive hemorrhage  CIR PT, OT , SLP-tent d/c 1/15 May be responding to   methylphenidate - appearing brighter, will increase to 10mg  BID No improvement motorically  2.  Antithrombotics: -DVT/anticoagulation: Subcutaneous heparin initiated 07/14/2019.  Monitor for any bleeding episodes- change to enoxaparin to reduce freq of injection -1/4 normal PLT             -antiplatelet therapy: N/A  3. Pain Management: Tylenol as needed 4. Mood: Provide emotional support             -antipsychotic agents: N/A 5. Neuropsych: This patient is not capable of making decisions on her own behalf. 6. Skin/Wound Care: Routine skin checks 7. Fluids/Electrolytes/Nutrition: Routine in and outs. BUN/Cr normal po intake ~63ml per day recorded  8.  Hypertension.  Lisinopril 20 mg twice daily, Norvasc 10 mg daily.  On hold,had elevation last noc will monitor              Vitals:   08/08/19 2006 08/09/19 0559  BP: (!) 152/91 127/84  Pulse: 85 79  Resp: 19 20  Temp: 97.8 F (36.6 C) 98.2 F (36.8 C)  SpO2: 100% 99%     Tachycardia - now improved , will monitor on increased dose of methylphenidate  9.  Hypernatremia-likely due to hypertonic saline             resolved Na 133 10.  Leukocytosis             Resolved  11.  Transaminasemia- no recent tylenol use isolated to ALT, abd exam is normal - some  ETOH use prior to [place] 1-2 drinks per day  12.  Aphasia as per husband native language is Portugese, had a nurse on acute that spoke and pt seemed to respond better, discussed that apahsic pts may have better comprehension of native language vs languages learned later  [place] date from 1/15 to 1/22.Attention improving with methylphenidate which is allowing pt to participate and progress toward goals still +2 Max with goals of  mod A Husband will be with patient upon discharge and will hire caregivers for her when he returns to work.    LOS: 22 days A FACE TO [place] E Jabri Blancett 08/09/2019, 11:37 AM

## 2019-08-09 NOTE — Progress Notes (Signed)
Speech Language Pathology Daily Session Note  Patient Details  Name: Melanie Madden MRN: [id number] Date of Birth: Jul 25, 1966  Today's Date: 08/09/2019 SLP Individual Time: [id number] SLP Individual Time Calculation (min): 23 min  Short Term Goals: Week 3: SLP Short Term Goal 1 (Week 3): Pt will consume Dys2/thin diet with min overt s/sx aspiration, efficient mastication, oral clearance and slow rate with Min A multimodal cues. SLP Short Term Goal 2 (Week 3): Pt will consume Dys 3 trials with min s/s aspiration, efficient mastictaion and oral clearance with liquid wash with Min A verbal cues over 3 sessisons prior to upgrade. SLP Short Term Goal 3 (Week 3): Pt will express wants/needs via multimodal means (gestures, facial expression and communication board) with 60% accuracy with mod A verbal/visual cues. SLP Short Term Goal 4 (Week 3): Pt will imitate vowel sounds with 75% accuracy given Max A multimodal cues. SLP Short Term Goal 5 (Week 3): Pt will sustain attention to tasks for 2 minute intervals with Max A multimodal cues for redirection.  Skilled Therapeutic Interventions:  Pt was seen for skilled ST targeting goals for communication.  Pt was received in the wheelchair in the dayroom with husband present.  Husband excused himself from session so as not to be distracting to pt but remained within eyesight of SLP.  Pt appeared frustrated today with decreased willingness to engage with therapist.  Pt often attempted to swat therapist away or push her wheelchair away from therapist.  Pt indicated via yes/no responses that she was "upset" but could not answer further yes/no question to clarify.  Pt mostly stared flatly at therapist but when given the choice to end session early she would not indicate her preference either way.  Husband reintegrated himself into the therapy session and was able get an emphatic "yes" response (head nod) when asked if she was bored and wanted him to bring her a magazine.   Pt was also able to communicate preferences via thumbs up to questions asked by husband with max multimodal cues from husband.  Pt was left in dayroom with husband present.  Continue per current plan of care.    Pain Pain Assessment Pain Scale: 0-10 Pain Score: 0-No pain  Therapy/Group: Individual Therapy  Alyrica Thurow, Selinda Orion 08/09/2019, 4:45 PM

## 2019-08-10 ENCOUNTER — Inpatient Hospital Stay (HOSPITAL_COMMUNITY): Payer: Managed Care, Other (non HMO) | Admitting: Physical Therapy

## 2019-08-10 ENCOUNTER — Inpatient Hospital Stay (HOSPITAL_COMMUNITY): Payer: Managed Care, Other (non HMO)

## 2019-08-10 ENCOUNTER — Inpatient Hospital Stay (HOSPITAL_COMMUNITY): Payer: Managed Care, Other (non HMO) | Admitting: Occupational Therapy

## 2019-08-10 LAB — CBC WITH DIFFERENTIAL/PLATELET
Abs Immature Granulocytes: 0.02 10*3/uL (ref 0.00–0.07)
Basophils Absolute: 0 10*3/uL (ref 0.0–0.1)
Basophils Relative: 1 %
Eosinophils Absolute: 0.1 10*3/uL (ref 0.0–0.5)
Eosinophils Relative: 3 %
HCT: 34.5 % — ABNORMAL LOW (ref 36.0–46.0)
Hemoglobin: 11.8 g/dL — ABNORMAL LOW (ref 12.0–15.0)
Immature Granulocytes: 1 %
Lymphocytes Relative: 37 %
Lymphs Abs: 1.5 10*3/uL (ref 0.7–4.0)
MCH: 30.4 pg (ref 26.0–34.0)
MCHC: 34.2 g/dL (ref 30.0–36.0)
MCV: 88.9 fL (ref 80.0–100.0)
Monocytes Absolute: 0.4 10*3/uL (ref 0.1–1.0)
Monocytes Relative: 11 %
Neutro Abs: 2 10*3/uL (ref 1.7–7.7)
Neutrophils Relative %: 47 %
Platelets: 138 10*3/uL — ABNORMAL LOW (ref 150–400)
RBC: 3.88 MIL/uL (ref 3.87–5.11)
RDW: 14.3 % (ref 11.5–15.5)
WBC: 4.1 10*3/uL (ref 4.0–10.5)
nRBC: 0 % (ref 0.0–0.2)

## 2019-08-10 LAB — COMPREHENSIVE METABOLIC PANEL
ALT: 40 U/L (ref 0–44)
AST: 20 U/L (ref 15–41)
Albumin: 2.6 g/dL — ABNORMAL LOW (ref 3.5–5.0)
Alkaline Phosphatase: 56 U/L (ref 38–126)
Anion gap: 8 (ref 5–15)
BUN: 14 mg/dL (ref 6–20)
CO2: 27 mmol/L (ref 22–32)
Calcium: 9.3 mg/dL (ref 8.9–10.3)
Chloride: 103 mmol/L (ref 98–111)
Creatinine, Ser: 0.62 mg/dL (ref 0.44–1.00)
GFR calc Af Amer: >60 mL/min (ref >60)
GFR calc non Af Amer: >60 mL/min (ref >60)
Glucose, Bld: 103 mg/dL — ABNORMAL HIGH (ref 70–99)
Potassium: 4 mmol/L (ref 3.5–5.1)
Sodium: 138 mmol/L (ref 135–145)
Total Bilirubin: 0.8 mg/dL (ref 0.3–1.2)
Total Protein: 5.3 g/dL — ABNORMAL LOW (ref 6.5–8.1)

## 2019-08-10 NOTE — Progress Notes (Signed)
[place] PHYSICAL MEDICINE & REHABILITATION PROGRESS NOTE  Subjective/Complaints:  Remains globally aphasic Sitting up with therapy in gym and communicates well with blinks. Does appear to have some understanding of conversation as facial expressions are responsive. Appears appropriately depressed regarding her condition.   ROS: limited due to language/communication   Objective: Vital Signs: Blood pressure 135/89, pulse 85, temperature 97.8 F (36.6 C), temperature source Oral, resp. rate 18, height 5\' 7"  (1.702 m), weight 57.7 kg, SpO2 96 %. No results found. Recent Labs    08/10/19 0628  WBC 4.1  HGB 11.8*  HCT 34.5*  PLT 138*   Recent Labs    08/10/19 0628  NA 138  K 4.0  CL 103  CO2 27  GLUCOSE 103*  BUN 14  CREATININE 0.62  CALCIUM 9.3    Physical Exam: BP 135/89 (BP Location: Left Arm)   Pulse 85   Temp 97.8 F (36.6 C) (Oral)   Resp 18   Ht 5\' 7"  (1.702 m)   Wt 57.7 kg   SpO2 96%   BMI 19.92 kg/m  Constitutional: No distress . Vital signs reviewed. Sitting up with therapy in gym. Neck: supple Cardiovascular: RRR without murmur. No JVD    Respiratory: CTA Bilaterally without wheezes or rales. Normal effort    GI: BS +, non-tender, non-distended  Skin: Warm and dry.  Intact. Musc: No edema in extremities.  No tenderness in extremities. Neuro: Alert Smiles and makes good eye contact during communication and communicates well with blinks. Does appear to have some understanding of conversation as facial expressions are responsive.  HEENT: EOMI, oral membranes moist Globally aphasic,  Exam limited due to lack of ability to follow commands, however seen spontaneously moving LUE and LLE appears to sense pain on right side, unchanged 0/5 RUE. RLE tr to 1/5 HE, 0/5 distally Tone reduced on Right side  Skin: Skin is warm and dry.  Psychiatric:  Flat; Appears appropriately depressed regarding her condition.   Assessment/Plan: 1. Functional deficits  secondary to left basal ganglia hypertensive hemorrhage which require 3+ hours per day of interdisciplinary therapy in a comprehensive inpatient rehab setting.  Physiatrist is providing close team supervision and 24 hour management of active medical problems listed below.  Physiatrist and rehab team continue to assess barriers to discharge/monitor patient progress toward functional and medical goals  Care Tool:  Bathing    Body parts bathed by patient: Face, Chest, Abdomen, Front perineal area   Body parts bathed by helper: Right arm, Left arm, Buttocks, Right upper leg, Left upper leg, Right lower leg, Left lower leg     Bathing assist Assist Level: Maximal Assistance - Patient 24 - 49%     Upper Body Dressing/Undressing Upper body dressing   What is the patient wearing?: Pull over shirt    Upper body assist Assist Level: Moderate Assistance - Patient 50 - 74%    Lower Body Dressing/Undressing Lower body dressing      What is the patient wearing?: Pants, Incontinence brief     Lower body assist Assist for lower body dressing: 2 Helpers     Toileting Toileting    Toileting assist Assist for toileting: 2 Helpers     Transfers Chair/bed transfer  Transfers assist  Chair/bed transfer activity did not occur: Safety/medical concerns  Chair/bed transfer assist level: 2 Helpers     Locomotion Ambulation   Ambulation assist   Ambulation activity did not occur: Safety/medical concerns  Assist level: 2 helpers Assistive device: (rail  in hall) Max distance: 12   Walk 10 feet activity   Assist  Walk 10 feet activity did not occur: Safety/medical concerns  Assist level: 2 helpers Assistive device: Other (comment)(rail in hall)   Walk 50 feet activity   Assist Walk 50 feet with 2 turns activity did not occur: Safety/medical concerns         Walk 150 feet activity   Assist Walk 150 feet activity did not occur: Safety/medical concerns          Walk 10 feet on uneven surface  activity   Assist Walk 10 feet on uneven surfaces activity did not occur: Safety/medical concerns         Wheelchair     Assist Will patient use wheelchair at discharge?: Yes Type of Wheelchair: Manual    Wheelchair assist level: Dependent - Patient 0% Max wheelchair distance: 150 ft    Wheelchair 50 feet with 2 turns activity    Assist        Assist Level: Dependent - Patient 0%   Wheelchair 150 feet activity     Assist     Assist Level: Dependent - Patient 0%      Medical Problem List and Plan: 1.  Right side weakness and aphasia secondary to left basal ganglia hypertensive hemorrhage  CIR PT, OT , SLP-tent d/c 1/15 May be responding to methylphenidate - appearing brighter, will increase to 10mg  BID No improvement motorically  2.  Antithrombotics: -DVT/anticoagulation: Subcutaneous heparin initiated 07/14/2019.  Monitor for any bleeding episodes- change to enoxaparin to reduce freq of injection -1/4 normal PLT             -antiplatelet therapy: N/A  3. Pain Management: Tylenol as needed 4. Mood: Provide emotional support             -antipsychotic agents: N/A  1/11: Consider antidepressant? She does appear frustrated during therapy sessions, and may be more so as her cognition improves and she is better able to understand her condition and limitations.  5. Neuropsych: This patient is not capable of making decisions on her own behalf. 6. Skin/Wound Care: Routine skin checks 7. Fluids/Electrolytes/Nutrition: Routine in and outs. BUN/Cr normal po intake ~61ml per day recorded  8.  Hypertension.  Lisinopril 20 mg twice daily, Norvasc 10 mg daily.  On hold,had elevation last noc will monitor              Vitals:   08/09/19 2015 08/10/19 0454  BP: (!) 141/74 135/89  Pulse: 90 85  Resp: 18 18  Temp: 98 F (36.7 C) 97.8 F (36.6 C)  SpO2: 98% 96%  Tachycardia - now improved , will monitor on increased dose of  methylphenidate  9.  Hypernatremia-likely due to hypertonic saline             resolved Na 133 10.  Leukocytosis             Resolved  11.  Transaminasemia- no recent tylenol use isolated to ALT, abd exam is normal - some  ETOH use prior to [place] 1-2 drinks per day  12.  Aphasia as per husband native language is Portugese, had a nurse on acute that spoke and pt seemed to respond better, discussed that apahsic pts may have better comprehension of native language vs languages learned later 29. Disposition: moved DC date from 1/15 to 1/22. Attention improving with methylphenidate which is allowing pt to participate and progress toward goals still +2 Max with goals of mod  A Husband will be with patient upon discharge and will hire caregivers for her when he returns to work.    LOS: 23 days A FACE TO FACE EVALUATION WAS PERFORMED  Melanie Madden 08/10/2019, 9:49 AM

## 2019-08-10 NOTE — Progress Notes (Signed)
Physical Therapy Session Note  Patient Details  Name: Melanie Madden MRN: [id number] Date of Birth: 01-Jan-1966  Today's Date: 08/10/2019 PT Individual Time: 0900-1000 PT Individual Time Calculation (min): 60 min   Short Term Goals: Week 3:  PT Short Term Goal 1 (Week 3): Pt will transfer with max assist of 1 consistently PT Short Term Goal 2 (Week 3): PT will propell WC 67ft with mod assist using hemi technique PT Short Term Goal 3 (Week 3): Pt will ambulate at rail in hall x 104ft with max assist +2  Skilled Therapeutic Interventions/Progress Updates: Pt presented inTIS at nsg station nodding head in agreement to therapy. PTA transported to rehab gym and performed squat pivot transfer to mat maxA. Pt then pushing back towards TIS. Pt asking pt if needs to use bathroom (as known to get agitated when incontinent). Pt nodding head yes then impulsively initiated squat pivot transfer back into TIS. PTA transported pt back to room. Once in room pt began making sounds when preparing to transfer pt to bathroom. Pt then pushes bathroom door closed as PTA attempted to take pt into bathroom. PTA asks pt if she does not want to use bathroom but use standard w/c instead with pt nodding head "yes". PTA then attempts to transfer to standard w/c to work on w/c mobility during part of session. Standard w/c and TIS lined up side by side to allow PTA to transfer pt efficiently. Pt performed STS in Stedy with minA and decreased impulsive reaching noted. Pt was safety transferred to standard w/c with full lap tray placed on w/c. Pt transported to day room and participated in w/c mobility with pt initially requiring HOH assist to initiate propulsion. PTA then provided max facilitation for use of LLE and to have foot touch ground. Pt was able to sustain task for several feet then would become distracted and begin to propel w/c backwards or stop using LLE. After multiple attempts pt successfully propelled w/c approx 101ft with  minA and NOT propelling into nearby wall or hit doorframe. Pt then transported to hallway and participated in gait at wall with rail 51ft x 2 with maxA. Pt required total A for advancement of RLE however some trace activation in extensors noted when PTA wt shifted pt to R for LLE advancement. Once completed pt returned to nsg station with full lap tray in place and nsg notified.      Therapy Documentation Precautions:  Precautions Precautions: Fall, Other (comment) Precaution Comments: pusher, right hemiparesis Restrictions Weight Bearing Restrictions: No General:   Vital Signs: Therapy Vitals Temp: 99.5 F (37.5 C) Temp Source: Oral Pulse Rate: 86 Resp: 18 BP: (!) 142/84 Patient Position (if appropriate): Lying Oxygen Therapy SpO2: 99 % O2 Device: Room Air Pain: Pain Assessment Pain Score: 0-No pain   Therapy/Group: Individual Therapy  Stetson Pelaez  Raymond Bhardwaj, PTA  08/10/2019, 3:56 PM

## 2019-08-10 NOTE — Progress Notes (Signed)
Occupational Therapy Session Note  Patient Details  Name: Melanie Madden MRN: [id number] Date of Birth: January 05, 1966  Today's Date: 08/10/2019 OT Individual Time: 0700-0758 OT Individual Time Calculation (min): 58 min    Short Term Goals: Week 3:  OT Short Term Goal 1 (Week 3): STGs=LTGs due to ELOS  Skilled Therapeutic Interventions/Progress Updates:    Upon entering the room, pt supine in bed with no c/o,signs, or symptoms of pain. Pt does reply to one question very clearly as "NO!" Pt rolling L <> R to check for incontinence with min A. Total A to thread pants onto B feet but pt pulling over L hip while bridging. While therapist setting up equipment for transfer and further bed mobility. Pt is observed to utilize bed rails and come into circle sitting with supervision and propping self with UE. Once therapist moved LEs to transition to EOB pt required mod - max A for trunk control. Pt very impulsive this session with transfer and needing +2 assistance for safety. Pt's breakfast tray set up and pt demonstrating ability to scoop and use utensil to bring food to mouth. Pt needing mod cuing to slow down speed and take smaller bites this session for safety. NT arrived to take over supervision secondary to therapy time coming to an end. All needs within reach and lap tray donned for safety.   Therapy Documentation Precautions:  Precautions Precautions: Fall, Other (comment) Precaution Comments: pusher, right hemiparesis Restrictions Weight Bearing Restrictions: No General:   Vital Signs: Therapy Vitals Temp: 99.5 F (37.5 C) Temp Source: Oral Pulse Rate: 86 Resp: 18 BP: (!) 142/84 Patient Position (if appropriate): Lying Oxygen Therapy SpO2: 99 % O2 Device: Room Air   Therapy/Group: Individual Therapy  Gypsy Decant 08/10/2019, 4:44 PM

## 2019-08-10 NOTE — Progress Notes (Signed)
Speech Language Pathology Daily Session Note  Patient Details  Name: Melanie Madden MRN: [id number] Date of Birth: Nov 14, 1965  Today's Date: 08/10/2019 SLP Individual Time: 1115-1150 SLP Individual Time Calculation (min): 35 min  Short Term Goals: Week 3: SLP Short Term Goal 1 (Week 3): Pt will consume Dys2/thin diet with min overt s/sx aspiration, efficient mastication, oral clearance and slow rate with Min A multimodal cues. SLP Short Term Goal 2 (Week 3): Pt will consume Dys 3 trials with min s/s aspiration, efficient mastictaion and oral clearance with liquid wash with Min A verbal cues over 3 sessisons prior to upgrade. SLP Short Term Goal 3 (Week 3): Pt will express wants/needs via multimodal means (gestures, facial expression and communication board) with 60% accuracy with mod A verbal/visual cues. SLP Short Term Goal 4 (Week 3): Pt will imitate vowel sounds with 75% accuracy given Max A multimodal cues. SLP Short Term Goal 5 (Week 3): Pt will sustain attention to tasks for 2 minute intervals with Max A multimodal cues for redirection.  Skilled Therapeutic Interventions:SLP targeted language skills. SLP facilitated sustained attention and receptive language skills utilizing PEG two color sorting task, pt demonstrated sustained attention in 2 minute interval when given colors to sort and self-correction of errors. Pt demonstrated basic problem and sustained attention skill in simple PEG design task x3, with SLP assistance for initial planning for peg location, pt demonstrated ability to continue pattern and min A verbal cues for correction of errors. Pt demonstrated sustained attention for 3 minutes. Pt demonstrated overall great improvement in sustained attention, problem solving and participation in requested therapy tasks. SLP also facilitated language skills utilizing communication boards matching pictures in a field of 4, demonstrating 100% accuracy and 75% accuracy. Pt demonstrated ability  to select verbally requested picture from communication board (field of 4) with 50% accuracy and given visual aid cues of same picture pt demonstrated 75% accuracy. Pt demonstrated facial grimace and grabbed bed railing. Pt demonstrated ability to express wants/needs indicating her brief was wet utilizing communciation board when directions. Pt agreed to allow staff to change brief, via response to yes/no questions and supported by communication board, however when NT and SLP attempted transfer in stead pt became combative, shaking head, pushing SLP out of the way, grabbing onto wheelchair despite max verbal and visual education. Another NT arrived and took over transfer attempt from SLP.Treatment session ended early due to agitation and unwillingness to participate in transfer.      Pain Pain Assessment Pain Score: 0-No pain  Therapy/Group: Individual Therapy  Melanie Madden  [facility] 08/10/2019, 12:22 PM

## 2019-08-10 NOTE — Plan of Care (Signed)
  Problem: Consults Goal: RH STROKE PATIENT EDUCATION Description: See Patient Education module for education specifics  Outcome: Progressing Goal: Nutrition Consult-if indicated Outcome: Progressing   Problem: RH BOWEL ELIMINATION Goal: RH STG MANAGE BOWEL WITH ASSISTANCE Description: STG Manage Bowel with min Assistance. Outcome: Progressing Goal: RH STG MANAGE BOWEL W/MEDICATION W/ASSISTANCE Description: STG Manage Bowel with Medication with min Assistance. Outcome: Progressing   Problem: RH BLADDER ELIMINATION Goal: RH STG MANAGE BLADDER WITH ASSISTANCE Description: STG Manage Bladder With min Assistance Outcome: Progressing   Problem: RH SKIN INTEGRITY Goal: RH STG MAINTAIN SKIN INTEGRITY WITH ASSISTANCE Description: STG Maintain Skin Integrity With min Assistance. Outcome: Progressing   Problem: RH SAFETY Goal: RH STG ADHERE TO SAFETY PRECAUTIONS W/ASSISTANCE/DEVICE Description: STG Adhere to Safety Precautions With min Assistance and appropriate assistive Device. Outcome: Progressing   Problem: RH COGNITION-NURSING Goal: RH STG ANTICIPATES NEEDS/CALLS FOR ASSIST W/ASSIST/CUES Description: STG Anticipates Needs/Calls for Assist With min Assistance and verbal Cues. Outcome: Progressing   Problem: RH PAIN MANAGEMENT Goal: RH STG PAIN MANAGED AT OR BELOW PT'S PAIN GOAL Description: <3 on a 0-10 pain scale Outcome: Progressing   Problem: RH KNOWLEDGE DEFICIT Goal: RH STG INCREASE KNOWLEDGE OF DIABETES Description: Patient and caregiver will be able to demonstrate knowledge of diabetes medications, dietary restrictions, and follow care with the MD post discharge with min assist from staff. Outcome: Progressing Goal: RH STG INCREASE KNOWLEDGE OF HYPERTENSION Description: Patient and caregiver will demonstrate knowledge of HTN medications, dietary restrictions, and follow up care with the MD post discharge with min assist from staff, Outcome: Progressing Goal: RH STG  INCREASE KNOWLEDGE OF DYSPHAGIA/FLUID INTAKE Description: Patient and caregiver will increase knowledge on dysphagia diet, thickened liquids, and increased fluid intake with min assist from staff. Outcome: Progressing Goal: RH STG INCREASE KNOWLEGDE OF HYPERLIPIDEMIA Description: Patient and caregiver will demonstrate knowledge of HLD medications, dietary restrictions, and follow up care with the MD post discharge with min assist from staff. Outcome: Progressing Goal: RH STG INCREASE KNOWLEDGE OF STROKE PROPHYLAXIS Description: Patient and caregiver with demonstrate knowledge of medications used to prevent future strokes with min assist from staff. Outcome: Progressing

## 2019-08-11 ENCOUNTER — Inpatient Hospital Stay (HOSPITAL_COMMUNITY): Payer: Managed Care, Other (non HMO)

## 2019-08-11 ENCOUNTER — Inpatient Hospital Stay (HOSPITAL_COMMUNITY): Payer: Managed Care, Other (non HMO) | Admitting: Occupational Therapy

## 2019-08-11 NOTE — Progress Notes (Addendum)
Occupational Therapy Session Note  Patient Details  Name: Melanie Madden MRN: [id number] Date of Birth: May 14, 1966  Today's Date: 08/11/2019 OT Individual Time: [id number] and 1500-1600 OT Individual Time Calculation (min): 55 min and 60 mins   Short Term Goals: Week 3:  OT Short Term Goal 1 (Week 3): STGs=LTGs due to ELOS  Skilled Therapeutic Interventions/Progress Updates:    Session 1: Upon entering the room, pt supine in bed with no c/o,signs, or symptoms of pain this session. OT recommended shower to pt who nodded head "yes" and began moving towards EOB. OT placing stedy and pt very impulsively attempting to stand in it before positioned and locked. OT assisted pt into bathroom via stedy and onto roll in shower chair with assist of 1. Pt continues to have rapid anterior weight shifts and push towards the R with max - total A for safety and repositioning. Once water turned on, pt did not attempt to move as unsafely and was able to was face, chest, abdomen, and peri area. Total A for LB clothing management and standing into stedy from shower chair with Max A. OT assisted pt to wheelchair and positioned at sink for UB dressing tasks with focus on hemiplegic dressing technique and hand over hand assistance to pull shirt over R UE. Pt brushing her hair and used dryer as needed. Pt remained in wheelchair with lap tray donned for safety and assisted to RN station in tilt in space.  Session 2: Pt obtained at RN station. OT asking her if she needs to use bathroom and she nods "yes" and blinks eyes "yes". OT assisted pt to room and she pushed away stedy. Therefore, pt performed max A stand pivot transfer to the L onto drop arm commode chair over toilet. Pt with quick anterior weight shifts needing total A for safety with LOB due to decreased trunk control. Pt able to void while seated on toilet this session. Pt standing with max - total A and second helper needed for hygiene and clothing management. Pt  returning to wheelchair in same manner as above. Pt seated in wheelchair at sink for grooming tasks with hand over hand assistance needed to incorporate R UE. Pt sitting very still while therapist assists her with shaving parts of face per request/gestures. Pt brushing her hair with min cuing and use of L hand. OT assisted her with putting hair back up. Pt remaining in standard wheelchair with lap tray donned for safety and call bell within reach. Pt drinking thin liquids with therapist providing supervision as well. All needs within reach and NT notified of pt's position.   Therapy Documentation Precautions:  Precautions Precautions: Fall, Other (comment) Precaution Comments: pusher, right hemiparesis Restrictions Weight Bearing Restrictions: No   Therapy/Group: Individual Therapy  Gypsy Decant 08/11/2019, 12:43 PM

## 2019-08-11 NOTE — Progress Notes (Signed)
Physical Therapy Weekly Progress Note  Patient Details  Name: Melanie Madden MRN: [id number] Date of Birth: 1966/02/16  Beginning of progress report period: August 05, 2019 End of progress report period: August 11, 2019  Today's Date: 08/11/2019    Patient has met 3 of 3 short term goals.  Pt has demonstrated good progress during current week of therapy. Pt continues to demonstrate significant impulsive behaviors with particularly with transfers however has demonstrated improved sitting static sitting balance, minA for STS and has initiated gait at wall rail maxA. W/c mobility has also been initiated with pt requiring from modA to minA (nearing close S). Husband has been present for x 1 session for introduction to current overall mobility and education on current deficits.   Patient continues to demonstrate the following deficits muscle weakness and muscle paralysis, impaired timing and sequencing, abnormal tone, unbalanced muscle activation, ataxia, decreased coordination and decreased motor planning and decreased initiation, decreased attention, decreased awareness, decreased problem solving, decreased safety awareness and decreased memory and therefore will continue to benefit from skilled PT intervention to increase functional independence with mobility.  Patient progressing toward long term goals..  Continue plan of care.  PT Short Term Goals Week 3:  PT Short Term Goal 1 (Week 3): Pt will transfer with max assist of 1 consistently PT Short Term Goal 1 - Progress (Week 3): Met PT Short Term Goal 2 (Week 3): PT will propell WC 62f with mod assist using hemi technique PT Short Term Goal 2 - Progress (Week 3): Met PT Short Term Goal 3 (Week 3): Pt will ambulate at rail in hall x 157fwith max assist +2 PT Short Term Goal 3 - Progress (Week 3): Met Week 4:  PT Short Term Goal 1 (Week 4): Pt will maintain standing balacne with mod assist and LRAD up to 1 minute PT Short Term Goal 2 (Week 4):  Pt will propell WC [id number] min assist PT Short Term Goal 3 (Week 4): [facility] consistently  Skilled Therapeutic Interventions/Progress Updates:  Ambulation/gait training;DME/adaptive equipment instruction;UE/LE Strength taining/ROM;Psychosocial support;Balance/vestibular training;Functional electrical stimulation;UE/LE Coordination activities;Cognitive remediation/compensation;Functional mobility training;Splinting/orthotics;Visual/perceptual remediation/compensation;Community reintegration;Wheelchair propulsion/positioning;Stair training;Neuromuscular re-education;Discharge planning;Pain management;Therapeutic Activities;Patient/family education;Disease management/prevention;Therapeutic Exercise;Skin care/wound management   Therapy Documentation Precautions:  Precautions Precautions: Fall, Other (comment) Precaution Comments: pusher, right hemiparesis Restrictions Weight Bearing Restrictions: No    Vital Signs: Therapy Vitals Temp: 98.7 F (37.1 C) Temp Source: Oral Pulse Rate: 90 Resp: 16 BP: (!) 141/85 Patient Position (if appropriate): Sitting Oxygen Therapy SpO2: 99 % O2 Device: Room Air   Therapy/Group: Individual Therapy  AusLorie Phenix13/2021, 9:44 AM

## 2019-08-11 NOTE — Progress Notes (Signed)
Physical Therapy Session Note  Patient Details  Name: Melanie Madden MRN: [id number] Date of Birth: 10/06/1965  Today's Date: 08/11/2019 PT Individual Time: 1120-1205 and 1335-1405 PT Individual Time Calculation (min): 45 min and 30 min  Short Term Goals: Week 3:  PT Short Term Goal 1 (Week 3): Pt will transfer with max assist of 1 consistently PT Short Term Goal 2 (Week 3): PT will propell WC 73f with mod assist using hemi technique PT Short Term Goal 3 (Week 3): Pt will ambulate at rail in hall x 116fwith max assist +2  Skilled Therapeutic Interventions/Progress Updates: Tx1:  Pt presented in TIS with nsg present preparing for transfer to toilet. Pt's husband TeCoralyn Marklso present for session. Pt becoming frustrated with nsg banging on lap tray. PTA asked pt if needed to use toilet with pt nodding head yes. Explained need to transfer to toilet in order to perform task. Pt then less combative to perform transfer and performed STS in [facility] for STS. Pt then transferred to toilet with +2 for safety, pt noted to have incontinent brief. With encouragement pt performed peri-hygiene with washcloth. PTA changed clothing and brief total A for time management and due to safety being on toilet. Pt then transferred to standard w/c. PTA provided edu to husband regarding pt's progress (improved sitting balance, gait at wall rail, w/c mobility, etc). Pt transported to hallway by rehab gym. Ambulated at wall rail 1848f 2 maxA with pt demonstrating improved posture, hand placement at wall and PTA noting trace extensor activation in RLE upon wt bearing. P[facility] then [facility] via hemi technique. PTA requesting if husband can bring in shoes so that pt's foot can better reach ground. Pt was able to improve negotiation this session and propelled overall approx 23f84fth minA nearing close S. Pt's husband pleased with improving ability to manage w/c. Pt transported to nsg station at end  of session and left with full lap tray in place and staff present.   Tx2: Pt presented in w/c at nsg station. Pt transported to day room and performed stand pivot to mat to R maxA. While sitting at mat pt demonstrated improved sitting balance with pt able to sit erect without pushing indicated. Pt practiced scooting to R with PTA providing R lateral support and pt able to scoot x 5 with minA. Pt scooted back to L also minA with modA for truncal support due to pt's continued impulsivity when reaching forward and difficulty recovering. Attempted stand pivot to R back into pt's chair however pt reaching for chair and pulling towards pt in unsafe manner. PTA blocked pt from pulling chair further while explaining that actions were unsafe. W/c was then moved to L with pt performing squat pivot back into chair. Pt transported back to nsg station at end of session with lap tray in place and needs met.      Therapy Documentation Precautions:  Precautions Precautions: Fall, Other (comment) Precaution Comments: pusher, right hemiparesis Restrictions Weight Bearing Restrictions: No General:   Vital Signs: Therapy Vitals Temp: 98.7 F (37.1 C) Temp Source: Oral Pulse Rate: 90 Resp: 16 BP: (!) 141/85 Patient Position (if appropriate): Sitting Oxygen Therapy SpO2: 99 % O2 Device: Room Air Pain:   Mobility:   Locomotion :    Trunk/Postural Assessment :    Balance:   Exercises:   Other Treatments:      Therapy/Group: Individual Therapy  Melven Stockard 08/11/2019, 4:18 PM

## 2019-08-11 NOTE — Progress Notes (Signed)
[place] PHYSICAL MEDICINE & REHABILITATION PROGRESS NOTE  Subjective/Complaints:  Remains globally aphasic but trying to vocalize "Ahh, Ahh"   ROS: limited due to language/communication   Objective: Vital Signs: Blood pressure 125/76, pulse 78, temperature 98.2 F (36.8 C), resp. rate 20, height 5\' 7"  (1.702 m), weight 57.7 kg, SpO2 99 %. No results found. Recent Labs    08/10/19 0628  WBC 4.1  HGB 11.8*  HCT 34.5*  PLT 138*   Recent Labs    08/10/19 0628  NA 138  K 4.0  CL 103  CO2 27  GLUCOSE 103*  BUN 14  CREATININE 0.62  CALCIUM 9.3    Physical Exam: BP 125/76 (BP Location: Left Arm)   Pulse 78   Temp 98.2 F (36.8 C)   Resp 20   Ht 5\' 7"  (1.702 m)   Wt 57.7 kg   SpO2 99%   BMI 19.92 kg/m  Constitutional: No distress . Vital signs reviewed. Sitting up with therapy in gym. Neck: supple Cardiovascular: RRR without murmur. No JVD    Respiratory: CTA Bilaterally without wheezes or rales. Normal effort    GI: BS +, non-tender, non-distended  Skin: Warm and dry.  Intact. Musc: No edema in extremities.  No tenderness in extremities. Neuro: Alert Attempting to vocalize  HEENT: EOMI, oral membranes moist Globally aphasic,  Exam limited due to lack of ability to follow commands, however seen spontaneously moving LUE and LLE appears to sense pain on right side, unchanged 0/5 RUE. RLE tr to 1/5 HE, 0/5 distally Tone reduced on Right side  Skin: Skin is warm and dry.  Psychiatric:  Flat; Appears    Assessment/Plan: 1. Functional deficits secondary to left basal ganglia hypertensive hemorrhage which require 3+ hours per day of interdisciplinary therapy in a comprehensive inpatient rehab setting.  Physiatrist is providing close team supervision and 24 hour management of active medical problems listed below.  Physiatrist and rehab team continue to assess barriers to discharge/monitor patient progress toward functional and medical goals  Care  Tool:  Bathing    Body parts bathed by patient: Face, Chest, Abdomen, Front perineal area   Body parts bathed by helper: Right arm, Left arm, Buttocks, Right upper leg, Left upper leg, Right lower leg, Left lower leg     Bathing assist Assist Level: Maximal Assistance - Patient 24 - 49%     Upper Body Dressing/Undressing Upper body dressing   What is the patient wearing?: Pull over shirt    Upper body assist Assist Level: Moderate Assistance - Patient 50 - 74%    Lower Body Dressing/Undressing Lower body dressing      What is the patient wearing?: Pants     Lower body assist Assist for lower body dressing: Total Assistance - Patient < 25%     Toileting Toileting    Toileting assist Assist for toileting: 2 Helpers     Transfers Chair/bed transfer  Transfers assist  Chair/bed transfer activity did not occur: Safety/medical concerns  Chair/bed transfer assist level: 2 Helpers     Locomotion Ambulation   Ambulation assist   Ambulation activity did not occur: Safety/medical concerns  Assist level: 2 helpers Assistive device: (rail in hall) Max distance: 12   Walk 10 feet activity   Assist  Walk 10 feet activity did not occur: Safety/medical concerns  Assist level: 2 helpers Assistive device: Other (comment)(rail in hall)   Walk 50 feet activity   Assist Walk 50 feet with 2 turns activity did not  occur: Safety/medical concerns         Walk 150 feet activity   Assist Walk 150 feet activity did not occur: Safety/medical concerns         Walk 10 feet on uneven surface  activity   Assist Walk 10 feet on uneven surfaces activity did not occur: Safety/medical concerns         Wheelchair     Assist Will patient use wheelchair at discharge?: Yes Type of Wheelchair: Manual    Wheelchair assist level: Dependent - Patient 0% Max wheelchair distance: 150 ft    Wheelchair 50 feet with 2 turns activity    Assist         Assist Level: Dependent - Patient 0%   Wheelchair 150 feet activity     Assist     Assist Level: Dependent - Patient 0%      Medical Problem List and Plan: 1.  Right side weakness and aphasia secondary to left basal ganglia hypertensive hemorrhage  CIR PT, OT , SLP-tent d/c 1/15- team conf in am  May be responding to methylphenidate - appearing brighter, will increase to 10mg  BID No improvement motorically  2.  Antithrombotics: -DVT/anticoagulation: Subcutaneous heparin initiated 07/14/2019.  Monitor for any bleeding episodes- change to enoxaparin to reduce freq of injection -1/4 normal PLT             -antiplatelet therapy: N/A  3. Pain Management: Tylenol as needed 4. Mood: Provide emotional support             -antipsychotic agents: N/A  1/12, ritalin with antidepressant effect as well  5. Neuropsych: This patient is not capable of making decisions on her own behalf. 6. Skin/Wound Care: Routine skin checks 7. Fluids/Electrolytes/Nutrition: Routine in and outs. BUN/Cr normal po intake ~651ml per day recorded  8.  Hypertension.  Lisinopril 20 mg twice daily, Norvasc 10 mg daily.  On hold,had elevation last noc will monitor              Vitals:   08/10/19 1936 08/11/19 0421  BP: 139/85 125/76  Pulse: 83 78  Resp: 20 20  Temp: 98.9 F (37.2 C) 98.2 F (36.8 C)  SpO2: 98% 99%  Tachycardia - now improved , will monitor on increased dose of methylphenidate  9.  Hypernatremia-likely due to hypertonic saline             resolved Na 133 10.  Leukocytosis             Resolved  11.  Transaminasemia- no recent tylenol use isolated to ALT, abd exam is normal - some  ETOH use prior to [place] 1-2 drinks per day  12.  Aphasia as per husband native language is Portugese, had a nurse on acute that spoke and pt seemed to respond better, discussed that apahsic pts may have better comprehension of native language vs languages learned later 24. Disposition: moved DC date from 1/15 to  1/22. Attention improving with methylphenidate which is allowing pt to participate and progress toward goals still +2 Max with goals of mod A Husband will be with patient upon discharge and will hire caregivers for her when he returns to work.    LOS: 24 days A FACE TO [place] E Marthe Dant 08/11/2019, 8:06 AM

## 2019-08-12 ENCOUNTER — Inpatient Hospital Stay (HOSPITAL_COMMUNITY): Payer: Managed Care, Other (non HMO) | Admitting: Physical Therapy

## 2019-08-12 ENCOUNTER — Inpatient Hospital Stay (HOSPITAL_COMMUNITY): Payer: Managed Care, Other (non HMO) | Admitting: Occupational Therapy

## 2019-08-12 ENCOUNTER — Inpatient Hospital Stay (HOSPITAL_COMMUNITY): Payer: Managed Care, Other (non HMO)

## 2019-08-12 NOTE — Progress Notes (Signed)
Physical Therapy Session Note  Patient Details  Name: Melanie Madden MRN: [id number] Date of Birth: 1966/04/05  Today's Date: 08/12/2019 PT Individual Time: 9558-3167 and 4255-2589 PT Individual Time Calculation (min): 35 minand 45 min    Short Term Goals: Week 4:  PT Short Term Goal 1 (Week 4): Pt will maintain standing balacne with mod assist and LRAD up to 1 minute PT Short Term Goal 2 (Week 4): Pt will propell WC 1107f with min assist PT Short Term Goal 3 (Week 4): [facility] consistently  Skilled Therapeutic Interventions/Progress Updates:  Session 1   Pt received supine in bed with NT performing peri care following incontinent episode. Allowed to finish and PT returned to pinf pt sitting up in bed and agreeable to PT. scootint to EOB with mod assist for trunk stability and improved sequencing. [facility] placement and min assist for standing balance once BLE properly placed.   WC mobility x 78fwith min-mod assist using LLE and occassional use of LUE. Increased assist to maintain straight path when UE in use.   Sit<>stand at high low table with sustained standing tolerance up to 45sec with mod-max assist to engage R hip and knee extensors. Occasional trace activation in the R hip. But constant assist to prevent buckling. PT also assisted to hold WB through RUE. 1 finger subluxation noted in the RUE.   PT applied KT tape to RUE for subluxation management. Patient returned to room and [facility] in reach and all needs met. SLP also present at end of session.      Session 2.  Pt received sitting in WC and agreeable to PT. Mod assist stand pivot [facility]. Pt noted to have been incontinent and also was able to void while sitting on BSC. Peri care by PT with mod assist to maintain balance in stranding. PT also required to perform Clothing management with pt  using rail to maintain balance with mod assist and L knee blocked.   [facility] to mat table L and R with mod assist for trunk control, blocking the R knee, and improved sequencing with lateral pelvic translation. Sitting balance/deep core NMR to perform forward and R/L lateral reaches with BUE supported on therapy ball x 10 each direction. Lateral reaches  To place clothes pins on rail also performed with the LUE to improve lateal weight shifting and midline orientation. Max cues for technique, attention to task, and improved pelvic positioning to improve safety and prevent R LOB between reaches.   WC mobility with hemi-technique using L UE and LE x 753fith min assist to improve obstacles navigation and improve straight trajectory.   Patient returned to room and left sitting in [facility] lap tray in place, call bell in reach and all needs met.          Therapy Documentation Precautions:  Precautions Precautions: Fall, Other (comment) Precaution Comments: pusher, right hemiparesis Restrictions Weight Bearing Restrictions: No Pain: denies      Therapy/Group: Individual Therapy  AusLorie Phenix13/2021, 8:57 AM

## 2019-08-12 NOTE — Progress Notes (Signed)
Occupational Therapy Session Note  Patient Details  Name: Melanie Madden MRN: [id number] Date of Birth: 02-Apr-1966  Today's Date: 08/12/2019 OT Individual Time: 1405-1500 OT Individual Time Calculation (min): 55 min    Short Term Goals:  Week 3:  OT Short Term Goal 1 (Week 3): STGs=LTGs due to ELOS  Skilled Therapeutic Interventions/Progress Updates:    Upon entering the room, pt seated in wheelchair and given communication board. Pt would not use communication board and continues to only blink eyes and nod "yes" with every single question asked. Pt slings communication board to the floor when OT attempting to use and pt becomes more frustrated. Pt nods head "yes" when asked if she needed to use the bathroom but then pushes therapist away from wheelchair when attempting to assist into restroom. OT assisting pt to day room via wheelchair and attempted to have pt vocalize "no" while engaging in utilizing 3 lb hand weight for therapeutic exercise. Pt asked to do 20 reps of exercise and she kept count herself correctly but obviously did not verbalize numbers aloud. Pt given juice with supervision and requiring min cuing for small sips. Pt assisted back to room and pulling wheelchair to sink. Pt brushing teeth with set up A and mod cuing for sequencing. Pt remained in wheelchair with lap tray donned and call bell within reach.   Therapy Documentation Precautions:  Precautions Precautions: Fall, Other (comment) Precaution Comments: pusher, right hemiparesis Restrictions Weight Bearing Restrictions: No General:   Vital Signs: Therapy Vitals Temp: 98.1 F (36.7 C) Temp Source: Oral Pulse Rate: 93 Resp: 20 BP: (!) 142/82 Patient Position (if appropriate): Sitting Oxygen Therapy SpO2: 99 % O2 Device: Room Air   Therapy/Group: Individual Therapy  Gypsy Decant 08/12/2019, 4:39 PM

## 2019-08-12 NOTE — Progress Notes (Signed)
At beginning of the shift pt appeared agitated and upset, c/o of R hand pain. Once family left, meds were given, oral care performed, and pts hair was braided by this nurse. Pt appeared calm after. Pt rested well through out the night, with episodes of incontinence.

## 2019-08-12 NOTE — Progress Notes (Signed)
Speech Language Pathology Weekly Progress and Session Note  Patient Details  Name: Melanie Madden MRN: [id number] Date of Birth: 03-15-1966  Beginning of progress report period: August 03, 2019 End of progress report period: August 12, 2019  Today's Date: 08/12/2019 SLP Individual Time: 5621-3086 SLP Individual Time Calculation (min): 40 min  Short Term Goals: Week 3: SLP Short Term Goal 1 (Week 3): Pt will consume Dys2/thin diet with min overt s/sx aspiration, efficient mastication, oral clearance and slow rate with Min A multimodal cues. SLP Short Term Goal 1 - Progress (Week 3): Met SLP Short Term Goal 2 (Week 3): Pt will consume Dys 3 trials with min s/s aspiration, efficient mastictaion and oral clearance with liquid wash with Min A verbal cues over 3 sessisons prior to upgrade. SLP Short Term Goal 2 - Progress (Week 3): Not met SLP Short Term Goal 3 (Week 3): Pt will express wants/needs via multimodal means (gestures, facial expression and communication board) with 60% accuracy with mod A verbal/visual cues. SLP Short Term Goal 3 - Progress (Week 3): Met SLP Short Term Goal 4 (Week 3): Pt will imitate vowel sounds with 75% accuracy given Max A multimodal cues. SLP Short Term Goal 4 - Progress (Week 3): Not met SLP Short Term Goal 5 (Week 3): Pt will sustain attention to tasks for 2 minute intervals with Max A multimodal cues for redirection. SLP Short Term Goal 5 - Progress (Week 3): Met    New Short Term Goals: Week 4: SLP Short Term Goal 1 (Week 4): STG=LTG due to short ELOS  Weekly Progress Updates: Pt made good progress meeting 3 out 5 goals this reporting period, with overall increase in sustained attention, basic problem solving/emergent awareness, use all swallow strategies and receptive language skills. Pt is continuing dys 3 trials to advance solid upgrade, improved use of swallow strategies for safe PO consumption. Pt demonstrates ability to receptive identify request  picture on 4 file communication board ( 4 on front and 4 on back) with 75% accuracy. Pt demonstrated increase sustained attention 3-5 minute intervals during simple problem solving tasks sorting cards by 6 colors and return demonstration of simple patterns. Pt continues to demonstrate limit changes in vocalization. Pt would benefit from skilled ST services in order to maximize functional independence and reduce burden of care, requiring 24 hour supervision and continue ST services.     Intensity: Minumum of 1-2 x/day, 30 to 90 minutes Frequency: 3 to 5 out of 7 days Duration/Length of Stay: 1/22 Treatment/Interventions: Cognitive remediation/compensation;Cueing hierarchy;Dysphagia/aspiration precaution training;Functional tasks;Patient/family education;Therapeutic Activities;Speech/Language facilitation;Multimodal communication approach;Internal/external aids;Environmental controls   Daily Session  Skilled Therapeutic Interventions: Skilled ST services focused on swallow and cognitive skills. SLP facilitated receptive language skills utilizing communication board in fields of 4, pt demonstrated overall 75% accuracy in pointing to verbally requested picture. SLP also facilitated sustained attention and basic problem solving skills in novel card sorting task up to 6 colors , pt required initial demonstration of requested action and set up but was able to sort with supervision A verbal cues for error awareness and sustained attention in 3-5 minute intervals with min A verbal cues. Pt consumed dys 3 trial snack and thin via cup, with appropriate mastication, oral clearance and swallow appeared timely, requring initial cue to slow rate. Pt was left in room with call bell within reach and chair alarm set. ST recommends to continue skilled ST services.     General    Pain Pain Assessment Pain Scale: Faces Pain  Score: 0-No pain Faces Pain Scale: No hurt  Therapy/Group: Individual Therapy  Melanie Madden   [facility] 08/12/2019, 12:42 PM

## 2019-08-12 NOTE — Patient Care Conference (Signed)
Inpatient RehabilitationTeam Conference and Plan of Care Update Date: 08/12/2019   Time: 10:05 AM   Patient Name: Melanie Madden      Medical Record Number: [id number]  Date of Birth: Jul 16, 1966 Sex: Female         Room/Bed: 4W20C/4W20C-01 Payor Info: Payor: CIG[employer] / Plan: CIGNA MANAGED / Product Type: *No Product type* /    Admit Date/Time:  07/18/2019  2:02 PM  Primary Diagnosis:  Dysphagia  Patient Active Problem List   Diagnosis Date Noted  . Dysphagia 07/29/2019  . Infarction of left basal ganglia ([place]) 07/18/2019  . Hypernatremia   . Leukocytosis   . Essential hypertension   . Global aphasia   . 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 ICH (intracerebral hemorrhage) ([place]) 07/09/2019  . Anxiety state 02/21/2015  . Cephalalgia 12/21/2014    Expected Discharge Date: Expected Discharge Date: 08/21/19  Team Members Present: Physician leading conference: Dr. Alysia Penna Social Worker Present: Lennart Pall, LCSW Nurse Present: Suella Grove, RN;Deborah Hervey Ard, RN Case Manager: Karene Fry, RN PT Present: Barrie Folk, PT OT Present: Darleen Crocker, OT SLP Present: Charolett Bumpers, SLP PPS Coordinator present : Gunnar Fusi, Novella Olive, PT     Current Status/Progress Goal Weekly Team Focus  Bowel/Bladder   patient is incontinent of B/B, LBM 08/10/19. Pt able to alert staff when the brief is soiled.  pt will  regain continence.  Q2h toileting.   Swallow/Nutrition/ Hydration   dys 2 and thin via cup, mod A  Min A with least restrictive diet  slow rate and trials of dys 3   ADL's   Max A bathing at shower level on roll in shower chair. Mod A UB slef care, +2 LB dressing with Sit <>stand or max from bed level, Toileting with use of stedy and total A  Min A for grooming and UB self care and all other goals Mod A overall  NMR, functional cognition, functional transfers, balance, ADL retraining, caregiver education   Mobility   modA bed mobility, maxA squat pivot, maxA  stand pivot, minA STS with blocking of R knee to prevent buckling, gait at wall x 42ft with maxA, minA w/c propulsion in standard w/c.  min-modA  transfers, cognitive remediation, family ed   Communication   Max -Mod A  Max A expressive and Mod A receptive  utilizing Data processing manager, guestures and vowel Magazine features editor Observations  Max A, improved sustained attention and basic problem solving  Mod-Max A  basic problem solving, sustained attention   Pain   Pt is non verbal. pt gives non verbal cues that R hand is hurting. medication was given and effective  Pt will be pain free  Assess pain qshift/ PRN   Skin   MASD to groin/buttocks, Skin tear to R elbow  Skin will be free from further breakdown and infection.  Asses skin Qshift/ PRN    Rehab Goals Patient on target to meet rehab goals: Yes *See Care Plan and progress notes for long and short-term goals.     Barriers to Discharge  Current Status/Progress Possible Resolutions Date Resolved   Nursing                  PT  Home environment access/layout                 OT                  SLP  SW                Discharge Planning/Teaching Needs:  Husband has been here mostly in the evenigns, if needs to come in will for therapies. Was going to wait until closer to DC to do this. Has started process of hiring caregivers  May need to go ahead and have husband come in to begin teaching.   Team Discussion: Brighter on Ritalin, no verbalizations.  RN inc, frustrated, MASD.  OT toileted X 3 during therapies, already wet, does not like the steady, have to do timed toileting, max pivot to toilet, impulsive, shower yesterday, attending more, mod A goals.  PT w/c propulsion min/mod, mod A slide board, squat pivot max A.  SLP trials D3, better clearing, less impulsive, speech vocalization not much progress, is expressing basic wants/needs 75%, mod/max goals.  Husband to set up caregivers at home.    Revisions to Treatment Plan: N/A     Medical Summary Current Status: Global aphasia but attempting vocalization , sitting balance improved, attentionimprioved Weekly Focus/Goal: D/C planning  Barriers to Discharge: Medical stability   Possible Resolutions to Barriers: tolerating ritalin, husband setting up home care   Continued Need for Acute Rehabilitation Level of Care: The patient requires daily medical management by a physician with specialized training in physical medicine and rehabilitation for the following reasons: Direction of a multidisciplinary physical rehabilitation program to maximize functional independence : Yes Medical management of patient stability for increased activity during participation in an intensive rehabilitation regime.: Yes Analysis of laboratory values and/or radiology reports with any subsequent need for medication adjustment and/or medical intervention. : Yes   I attest that I was present, lead the team conference, and concur with the assessment and plan of the team.   Retta Diones 08/13/2019, 10:38 AM   Team conference was held via web/ teleconference due to [place] - 19

## 2019-08-13 ENCOUNTER — Inpatient Hospital Stay (HOSPITAL_COMMUNITY): Payer: Managed Care, Other (non HMO) | Admitting: Occupational Therapy

## 2019-08-13 ENCOUNTER — Inpatient Hospital Stay (HOSPITAL_COMMUNITY): Payer: Managed Care, Other (non HMO) | Admitting: Speech Pathology

## 2019-08-13 ENCOUNTER — Inpatient Hospital Stay (HOSPITAL_COMMUNITY): Payer: Managed Care, Other (non HMO)

## 2019-08-13 NOTE — Progress Notes (Signed)
Physical Therapy Session Note  Patient Details  Name: Melanie Madden MRN: [id number] Date of Birth: 05/13/1966  Today's Date: 08/13/2019 PT Individual Time: 0808-0902 PT Individual Time Calculation (min): 54 min   Short Term Goals:  Week 4:  PT Short Term Goal 1 (Week 4): Pt will maintain standing balacne with mod assist and LRAD up to 1 minute PT Short Term Goal 2 (Week 4): Pt will propell WC 155f with min assist PT Short Term Goal 3 (Week 4): [facility] 1 [facility] consistently  Skilled Therapeutic Interventions/Progress Updates:   Pt received sitting up in bed and agreeable to PT. Reciprocal scooting to EOB with mod assist overall with max assist x1 to prevent R LOB. Sit<>stand with UE support on bed rail and mod assist to don pants. Lateral scoot transf then [facility] due to poor set up and severe impulsivity. PT donned shoe of L foot. WC mobility through hall with supervision assist and only min cues for improved sequencing of UE/LE coordination as well as awareness of doorways on the R side.   PT applied AFO to the RLE with rigid upright and anterior support to aide in preventing knee buckling. [facility]<>[facility] to improve equal WB through BLE. Standing balance/tolerance with HW mod-max assist and constant assist from PT to block RLE and force WB through RLE. PT applied givmohr for shoulder support in standing.   Gait training in rehab gym with HCloverleafand RAFO. X 245fwith max assist+ 2 for safety for RLE advancement and terminal knee extension in WB. Improved activation and sequencing of RLE with increased distance with trace extension noted in quads.   Nustep reciprocal movement training x 7 min with max assist for attention to RLE and full extension. Pt able to initiate 25% of R LE advancement intermittently through treatment.   Patient returned to room and [facility] reach and all needs  met.        Therapy Documentation Precautions:  Precautions Precautions: Fall, Other (comment) Precaution Comments: pusher, right hemiparesis Restrictions Weight Bearing Restrictions: No    Pain: Pain Assessment Pain Scale: 0-10 Pain Score: 0-No pain    Therapy/Group: Individual Therapy  AuLorie Phenix/14/2021, 10:00 AM

## 2019-08-13 NOTE — Progress Notes (Signed)
[place] PHYSICAL MEDICINE & REHABILITATION PROGRESS NOTE  Subjective/Complaints:  Remains globally aphasic , able to wheel her manual WC    ROS: limited due to language/communication   Objective: Vital Signs: Blood pressure 134/80, pulse 83, temperature 97.8 F (36.6 C), resp. rate 18, height 5\' 7"  (1.702 m), weight 57.7 kg, SpO2 97 %. No results found. No results for input(s): WBC, HGB, HCT, PLT in the last 72 hours. No results for input(s): NA, K, CL, CO2, GLUCOSE, BUN, CREATININE, CALCIUM in the last 72 hours.  Physical Exam: BP 134/80 (BP Location: Left Arm)   Pulse 83   Temp 97.8 F (36.6 C)   Resp 18   Ht 5\' 7"  (1.702 m)   Wt 57.7 kg   SpO2 97%   BMI 19.92 kg/m  Constitutional: No distress . Vital signs reviewed. Sitting up with therapy in gym. Neck: supple Cardiovascular: RRR without murmur. No JVD    Respiratory: CTA Bilaterally without wheezes or rales. Normal effort    GI: BS +, non-tender, non-distended  Skin: Warm and dry.  Intact. Musc: No edema in extremities.  No tenderness in extremities. Neuro: Alert Attempting to vocalize  HEENT: EOMI, oral membranes moist Globally aphasic,  Exam limited due to lack of ability to follow commands, however seen spontaneously moving LUE and LLE appears to sense pain on right side, unchanged 0/5 RUE. RLE tr to 1/5 HE, 0/5 distally Tone reduced on Right side  Skin: Skin is warm and dry.  Psychiatric:  Flat; Appears    Assessment/Plan: 1. Functional deficits secondary to left basal ganglia hypertensive hemorrhage which require 3+ hours per day of interdisciplinary therapy in a comprehensive inpatient rehab setting.  Physiatrist is providing close team supervision and 24 hour management of active medical problems listed below.  Physiatrist and rehab team continue to assess barriers to discharge/monitor patient progress toward functional and medical goals  Care Tool:  Bathing    Body parts bathed by patient:  Face, Chest, Abdomen, Front perineal area   Body parts bathed by helper: Right arm, Left arm, Buttocks, Right upper leg, Left upper leg, Right lower leg, Left lower leg     Bathing assist Assist Level: Maximal Assistance - Patient 24 - 49%     Upper Body Dressing/Undressing Upper body dressing   What is the patient wearing?: Pull over shirt    Upper body assist Assist Level: Moderate Assistance - Patient 50 - 74%    Lower Body Dressing/Undressing Lower body dressing      What is the patient wearing?: Pants, Incontinence brief     Lower body assist Assist for lower body dressing: Total Assistance - Patient < 25%     Toileting Toileting    Toileting assist Assist for toileting: 2 Helpers     Transfers Chair/bed transfer  Transfers assist  Chair/bed transfer activity did not occur: Safety/medical concerns  Chair/bed transfer assist level: 2 Helpers     Locomotion Ambulation   Ambulation assist   Ambulation activity did not occur: Safety/medical concerns  Assist level: 2 helpers Assistive device: (rail in hall) Max distance: 12   Walk 10 feet activity   Assist  Walk 10 feet activity did not occur: Safety/medical concerns  Assist level: 2 helpers Assistive device: Other (comment)(rail in hall)   Walk 50 feet activity   Assist Walk 50 feet with 2 turns activity did not occur: Safety/medical concerns         Walk 150 feet activity   Assist Walk 150  feet activity did not occur: Safety/medical concerns         Walk 10 feet on uneven surface  activity   Assist Walk 10 feet on uneven surfaces activity did not occur: Safety/medical concerns         Wheelchair     Assist Will patient use wheelchair at discharge?: Yes Type of Wheelchair: Manual    Wheelchair assist level: Dependent - Patient 0% Max wheelchair distance: 150 ft    Wheelchair 50 feet with 2 turns activity    Assist        Assist Level: Dependent - Patient  0%   Wheelchair 150 feet activity     Assist     Assist Level: Dependent - Patient 0%      Medical Problem List and Plan: 1.  Right side weakness and aphasia secondary to left basal ganglia hypertensive hemorrhage  CIR PT, OT , SLP-making progress toward Mod A goals  May be responding to methylphenidate - appearing brighter, will increase to 10mg  BID No improvement motorically  2.  Antithrombotics: -DVT/anticoagulation: Subcutaneous heparin initiated 07/14/2019.  Monitor for any bleeding episodes- change to enoxaparin to reduce freq of injection -1/4 normal PLT             -antiplatelet therapy: N/A  3. Pain Management: Tylenol as needed 4. Mood: Provide emotional support             -antipsychotic agents: N/A  1/12, ritalin with antidepressant effect as well  5. Neuropsych: This patient is not capable of making decisions on her own behalf. 6. Skin/Wound Care: Routine skin checks 7. Fluids/Electrolytes/Nutrition: Routine in and outs. BUN/Cr normal po intake ~652ml per day recorded  8.  Hypertension.  Lisinopril 20 mg twice daily, Norvasc 10 mg daily.  On hold,had elevation last noc will monitor              Vitals:   08/12/19 1922 08/13/19 0533  BP: 130/83 134/80  Pulse: 85 83  Resp: 18 18  Temp: 98.6 F (37 C) 97.8 F (36.6 C)  SpO2: 99% 97%  Tachycardia - now improved , will monitor on increased dose of methylphenidate  9.  Hypernatremia-likely due to hypertonic saline             resolved Na 133 10.  Leukocytosis             Resolved  11.  Transaminasemia- no recent tylenol use isolated to ALT, abd exam is normal - some  ETOH use prior to [place] 1-2 drinks per day  12.  Aphasia as per husband native language is Portugese, had a nurse on acute that spoke and pt seemed to respond better, discussed that apahsic pts may have better comprehension of native language vs languages learned later 64. Disposition: moved DC date from 1/15 to 1/22. Attention improving with  methylphenidate which is allowing pt to participate and progress toward goals still +2 Max with goals of mod A Husband will be with patient upon discharge and will hire caregivers for her when he returns to work.    LOS: 26 days A FACE TO [place] E Azoria Abbett 08/13/2019, 8:32 AM

## 2019-08-13 NOTE — Progress Notes (Signed)
Occupational Therapy Session Note  Patient Details  Name: Melanie Madden MRN: [id number] Date of Birth: 1965/09/17  Today's Date: 08/13/2019 OT Individual Time: 1100-1200 OT Individual Time Calculation (min): 60 min    Short Term Goals: Week 3:  OT Short Term Goal 1 (Week 3): STGs=LTGs due to ELOS  Skilled Therapeutic Interventions/Progress Updates:    Pt received at RN station and appears to be very restless and upset. Pt yelling "AH AH". An attempt to use communication board but pt again throwing that onto the floor. OT assisted pt via wheelchair back to room and transferred from wheelchair > toilet with max A stand pivot transfer. Pt needing max A for clothing management. Her brief was soiled with urine and pt able to have small BM on toilet this session. OT assisted pt back to wheelchair in same manner. Pt seated in wheelchair at sink for hand hygiene and grooming with increased time and min cuing for sequencing of tasks. Pt was able to put lotion on her LEs and UEs with lotion placed on body and asked to rub in. When lotion bottle handed to pt she attempts to bring to mouth to "drink". Hand over hand assist to utilize R UE for self care tasks. Pt remaining in wheelchair at end of session with lap tray donned for safety and chair alarm belt activated. Call bell within reach.   Therapy Documentation Precautions:  Precautions Precautions: Fall, Other (comment) Precaution Comments: pusher, right hemiparesis Restrictions Weight Bearing Restrictions: No Pain: Pain Assessment Pain Scale: 0-10 Pain Score: 0-No pain   Therapy/Group: Individual Therapy  Gypsy Decant 08/13/2019, 12:27 PM

## 2019-08-13 NOTE — Progress Notes (Signed)
Speech Language Pathology Daily Session Note  Patient Details  Name: Melanie Madden MRN: [id number] Date of Birth: May 09, 1966  Today's Date: 08/13/2019 SLP Individual Time: 1340-1425 SLP Individual Time Calculation (min): 45 min  Short Term Goals: Week 4: SLP Short Term Goal 1 (Week 4): STG=LTG due to short ELOS  Skilled Therapeutic Interventions:  Skilled treatment session targeted dysphagia and communication. SLP presented communication board but pt dismissed with hand gesture. SLP targeted consistent use of head nods for yes/no and gestures to increase effectiveness of communication. SLP attempted  Initially pt was upgraded at bedside to thin liquids. As such, pt was placed on water only with no straw in abundance of caution. Pt has consumed for extended period of time without respiratory compromise and appears appropriate to expand consumption to all thin liquids as well as straw. Pt used head nods to indicate yes/no response in very deliberate way for choices of beverage (beverage items present). Pt consumed 16 oz thin Diet Coke via straw with no overt s/s of aspiration. During consumption able to utilize head nods for yes/no with consistency even with repetitive questions for choice of TV channels and return to previous channel that she was watching. Pt with sustained attention to specific TV program. Improvement from last treatment with this therapist. Order given for all thin liquids (not just water) and pt may use straws. Pt left upright in wheelchair, full lap tray in place, lap belt alarm on and all needs within reach. Continue per current plan of care.        Pain    Therapy/Group: Individual Therapy  Kenia Teagarden 08/13/2019, 4:31 PM

## 2019-08-14 ENCOUNTER — Inpatient Hospital Stay (HOSPITAL_COMMUNITY): Payer: Managed Care, Other (non HMO) | Admitting: Speech Pathology

## 2019-08-14 ENCOUNTER — Inpatient Hospital Stay (HOSPITAL_COMMUNITY): Payer: Managed Care, Other (non HMO) | Admitting: Physical Therapy

## 2019-08-14 ENCOUNTER — Inpatient Hospital Stay (HOSPITAL_COMMUNITY): Payer: Managed Care, Other (non HMO) | Admitting: Occupational Therapy

## 2019-08-14 NOTE — Progress Notes (Signed)
[place] PHYSICAL MEDICINE & REHABILITATION PROGRESS NOTE  Subjective/Complaints:  Remains globally aphasic attempting verbalization also "saluting"   ROS: limited due to language/communication   Objective: Vital Signs: Blood pressure (!) 148/96, pulse 83, temperature 98.1 F (36.7 C), temperature source Oral, resp. rate 18, height 5\' 7"  (1.702 m), weight 57.7 kg, SpO2 100 %. No results found. No results for input(s): WBC, HGB, HCT, PLT in the last 72 hours. No results for input(s): NA, K, CL, CO2, GLUCOSE, BUN, CREATININE, CALCIUM in the last 72 hours.  Physical Exam: BP (!) 148/96 (BP Location: Left Arm)   Pulse 83   Temp 98.1 F (36.7 C) (Oral)   Resp 18   Ht 5\' 7"  (1.702 m)   Wt 57.7 kg   SpO2 100%   BMI 19.92 kg/m  Constitutional: No distress . Vital signs reviewed. Sitting up with therapy in gym. Neck: supple Cardiovascular: RRR without murmur. No JVD    Respiratory: CTA Bilaterally without wheezes or rales. Normal effort    GI: BS +, non-tender, non-distended  Skin: Warm and dry.  Intact. Musc: No edema in extremities.  No tenderness in extremities. Neuro: Alert Attempting to vocalize  HEENT: EOMI, oral membranes moist Globally aphasic,  Exam limited due to lack of ability to follow commands, however seen spontaneously moving LUE and LLE appears to sense pain on right side, unchanged 0/5 RUE. RLE tr to 1/5 HE, 0/5 distally- unchanged Tone reduced on Right side  Skin: Skin is warm and dry.  Psychiatric:  Flat; Appears    Assessment/Plan: 1. Functional deficits secondary to left basal ganglia hypertensive hemorrhage which require 3+ hours per day of interdisciplinary therapy in a comprehensive inpatient rehab setting.  Physiatrist is providing close team supervision and 24 hour management of active medical problems listed below.  Physiatrist and rehab team continue to assess barriers to discharge/monitor patient progress toward functional and medical  goals  Care Tool:  Bathing    Body parts bathed by patient: Face, Chest, Abdomen, Front perineal area   Body parts bathed by helper: Right arm, Left arm, Buttocks, Right upper leg, Left upper leg, Right lower leg, Left lower leg     Bathing assist Assist Level: Maximal Assistance - Patient 24 - 49%     Upper Body Dressing/Undressing Upper body dressing   What is the patient wearing?: Pull over shirt    Upper body assist Assist Level: Moderate Assistance - Patient 50 - 74%    Lower Body Dressing/Undressing Lower body dressing      What is the patient wearing?: Pants, Incontinence brief     Lower body assist Assist for lower body dressing: Total Assistance - Patient < 25%     Toileting Toileting    Toileting assist Assist for toileting: Maximal Assistance - Patient 25 - 49%     Transfers Chair/bed transfer  Transfers assist  Chair/bed transfer activity did not occur: Safety/medical concerns  Chair/bed transfer assist level: Maximal Assistance - Patient 25 - 49%     Locomotion Ambulation   Ambulation assist   Ambulation activity did not occur: Safety/medical concerns  Assist level: 2 helpers Assistive device: Walker-hemi Max distance: 20   Walk 10 feet activity   Assist  Walk 10 feet activity did not occur: Safety/medical concerns  Assist level: 2 helpers Assistive device: Walker-hemi   Walk 50 feet activity   Assist Walk 50 feet with 2 turns activity did not occur: Safety/medical concerns         Walk  150 feet activity   Assist Walk 150 feet activity did not occur: Safety/medical concerns         Walk 10 feet on uneven surface  activity   Assist Walk 10 feet on uneven surfaces activity did not occur: Safety/medical concerns         Wheelchair     Assist Will patient use wheelchair at discharge?: Yes Type of Wheelchair: Manual    Wheelchair assist level: Supervision/Verbal cueing Max wheelchair distance: 150 ft     Wheelchair 50 feet with 2 turns activity    Assist        Assist Level: Supervision/Verbal cueing   Wheelchair 150 feet activity     Assist     Assist Level: Supervision/Verbal cueing      Medical Problem List and Plan: 1.  Right side weakness and aphasia secondary to left basal ganglia hypertensive hemorrhage  CIR PT, OT , SLP-making progress toward Mod A goals  May be responding to methylphenidate - appearing brighter, will increase to 10mg  BID No improvement motorically  2.  Antithrombotics: -DVT/anticoagulation: Subcutaneous heparin initiated 07/14/2019.  Monitor for any bleeding episodes- change to enoxaparin to reduce freq of injection -1/4 normal PLT             -antiplatelet therapy: N/A  3. Pain Management: Tylenol as needed 4. Mood: Provide emotional support             -antipsychotic agents: N/A  1/12, ritalin with antidepressant effect as well  5. Neuropsych: This patient is not capable of making decisions on her own behalf. 6. Skin/Wound Care: Routine skin checks 7. Fluids/Electrolytes/Nutrition: Routine in and outs. BUN/Cr normal po intake ~628ml per day recorded  8.  Hypertension.  Lisinopril 20 mg twice daily, Norvasc 10 mg daily.  On hold,had elevation last noc will monitor              Vitals:   08/13/19 1334 08/13/19 1937  BP: 135/86 (!) 148/96  Pulse: 90 83  Resp:  18  Temp: 98.9 F (37.2 C) 98.1 F (36.7 C)  SpO2: 98% 100%  Tachycardia - now improved , will monitor on increased dose of methylphenidate  9.  Hypernatremia-likely due to hypertonic saline             resolved Na 133 10.  Leukocytosis             Resolved  11.  Transaminasemia- no recent tylenol use isolated to ALT, abd exam is normal - some  ETOH use prior to [place] 1-2 drinks per day  12.  Aphasia as well as speech apraxia 13. Disposition: moved DC date from 1/15 to 1/22. Attention improving with methylphenidate which is allowing pt to participate and progress toward goals  still +2 Max with goals of mod A Husband will be with patient upon discharge and will hire caregivers for her when he returns to work.  Resists using steady with nursing but still needs due to poor sitting balance- speech tx working on this with pt    LOS: 27 days A FACE TO [place] E Madeline Bebout 08/14/2019, 7:19 AM

## 2019-08-14 NOTE — Progress Notes (Signed)
Physical Therapy Session Note  Patient Details  Name: Melanie Madden MRN: [id number] Date of Birth: 12-Jun-1966  Today's Date: 08/14/2019 PT Individual Time: 0900-1000 and 1545-1615 PT Individual Time Calculation (min): 60 min and 30 min   Short Term Goals: Week 4:  PT Short Term Goal 1 (Week 4): Pt will maintain standing balacne with mod assist and LRAD up to 1 minute PT Short Term Goal 2 (Week 4): Pt will propell WC 161f with min assist PT Short Term Goal 3 (Week 4): [facility] 1 person consistently  Skilled Therapeutic Interventions/Progress Updates:  Session1  [facility]. PT instructed p tin WC mobility throughout rehab unit x [id number] supervision assist and only min cues for awareness of obstacles on the R as well as safety with doorway management.   PT applied R AFO and shoulder support with gait belt for subluxation management. Sit<>[facility] x 12 throughout treatment. PT required to block the RLE from buckling and improve weight shifting over the LLE.   Standing balance with Mod assist, HW, AFO, and RUE supported in sling; x4 bouts 1.5 - 2 minutes with visual feedback from mirror for awareness of lateral LOB. No noted activation of the RLE to prevent Lateral LOB requiring constant visual, tactile and vebal cues for extension through R hip and knee.   Gait training with HW, RAFO x [id number] max assist and +2 for WC. Intermittent activation of extension through the RLE in stance, but assist still required to block knee and prevent buckling as well as to advance the LE. No active hip flexion noted.   Patient returned to room and [facility] and all needs met.      Session 2.  Pt received sitting in WC and agreeable to PT. Pt requesting to use restroom with gestures. Stand pivot transfer to toilet with mod assist, LUE support on rail and PT to  block the R knee. Pt able to void on toilet, but had been in continent prior to transfer. Sit<>[facility] for PT to performed clothing management. CGA required for balance while sitting on toilet with only one near LOB. Squat pivot [facility].   Pt transported to day room . Squat pivot transfer to and from nustep with mod assist and min cues for set up to prevent LOB. Nustep reciprocal movement training with BLE x 8 minutes, level 1>4, thigh support in place throughout as well as min-mod assist for initiation of extension on the RLE. Marked improvement in activation and ROM into extension on the R side with increased repetitions. Patient returned to room and [facility], call bell in reach, and all needs met.             Therapy Documentation Precautions:  Precautions Precautions: Fall, Other (comment) Precaution Comments: pusher, right hemiparesis Restrictions Weight Bearing Restrictions: No   Pain:   Faces: none.    Therapy/Group: Individual Therapy  [name]/2021, 10:02 AM

## 2019-08-14 NOTE — Progress Notes (Signed)
  Patient ID: Melanie Madden, female   DOB: 1966-06-04, 54 y.o.   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  A walker will not resolve issue with performing activities of daily living.  A wheelchair will allow patient to safely perform daily activities.  Patient is not able to propel themselves in the home using a standard weight wheelchair due to hemiplegia.  Patient can self propel in the lightweight wheelchair.

## 2019-08-14 NOTE — Progress Notes (Signed)
Speech Language Pathology Daily Session Note  Patient Details  Name: Melanie Madden MRN: [id number] Date of Birth: 05-Jan-1966  Today's Date: 08/14/2019 SLP Individual Time: 0715-0730; 2409-7353; 2992-4268 SLP Individual Time Calculation (min): 15 min; 14 min; 15 min  Short Term Goals: Week 4: SLP Short Term Goal 1 (Week 4): STG=LTG due to short ELOS  Skilled Therapeutic Interventions:  This Probation officer made aware by nursing staff that pt is resistant to using STedy and being toileted by nursing staff. In effort to promote understanding for need of Stedy and to facilitate comprehension of toileting tasks, SLP provided multiple skilled sessions throughout the day to target increased understanding of task and to education NT/nursing on how to facilitate pt comprehension of task.   First session, SLP received pt in bed with dry brief. Pt not able to indicate comprehension of yes/no questions with visual and tactile cues such as "is your brief dry?/ do you need ot use bathroom?" Pt looked around in bed demonstrating decreased understanding of concept. SLP presented [facility] and stated "time to pee." Pt knoded head "yes" but moved arm to indicated didn't want Stedy. SLP physically assisted pt with coming to edge of bed while allowing pt to expereience loss of balance to her right. Each time, SLP showed pt the [facility] and said "see we must use." After 3rd time of lossing balance, pt was agreeable to using Stedy. Pt required Moderate verbal and visual cues to sequence each step of use. Pt ransfered to toilet and was able to urinate. Pt required Max A cues for impulsivity specifically waiting until urine ceased before standing up and required Total A to perform peri-care. This Probation officer spoke withbpt's NT and coordinate next timed toileting. After toileting, pt viusally presented with bed or wheelchair, pt required moderate cues to gesture which choice. Pt left upright in wheelchair, full lap tray in place, lap belt alarm on  and all needs within reach.   Second session, focused on communication of wants/needs using gestures and head nods to indicate yes/no. SLP going by pt's room and pt could be heard yelling. SLP found pt in bathroom with NT on toilet with pt attempting to communicate unknown idea. She continued to raise her voice using the same vowel "ah" that she frequently produces and hitting on the [place]. Pt responsive to fimr redirection to cease raising her voice and to wait while SLP provided demonstration of basic quesitons to help NT figure out what she wanted to express. Pt required Moderate amount of initially firm redirection to remain quiet for a few minutes. NT did provide that pt had gestured to bathroom and was receptive to using Stedy to transfer to toilet. Pt with successful voiding in toilet. SLP provided simple basic questions "are you finished peeing? Stand up, sit down," once pt in [place] SLP asked pt if pt needed anything in bathroom, pt responded "no" (head nod) and then pt transfered to room. Again pt needed firm redireciton to control raising her voice at NT and touching NT. SLP provided hand and said "put my hand on what you want." Pt put this writer's hand on pt's leg - pt wanted to put on pants, her legs were bare. Pt extremely appreciative of NT and SLP after she had pants on. During these moments of attempts to communicate, pt benefits from direct simple questions 1 at a time and use of gestures to indicate wants/needs.   Later in the day, pt's husband was requesting communication board and had multiple quesitons regarding  pt's progress and how to facilitate expressive abilities. This Probation officer met with pt and her husband. Above sessions described and ways to facilitate use of gestures to communicate during moments of breakdowns. Her husband able to return demonstration of information and has been selecting one idea per several days and paring this with gesture. Such as having pt point to lights to  indicate that she wants lights off. Pt infact used this gesture earlier in the first session with purpose to communicate.        Pain Pain Assessment Pain Scale: Faces Faces Pain Scale: No hurt  Therapy/Group: Individual Therapy  Caera Enwright 08/14/2019, 12:16 PM

## 2019-08-14 NOTE — Progress Notes (Signed)
Occupational Therapy Weekly Progress Note  Patient Details  Name: Melanie Madden MRN: [id number] Date of Birth: [birth year]  Beginning of progress report period: 08/02/2018 End of progress report period: 08/13/2018  Today's Date: 08/14/2019 OT Individual Time: 1300-1414 OT Individual Time Calculation (min): 74 min   Short term goals were set as long term goals during last report period. Since that time, LOS has been extended and she has not yet met her LTGs.   Pt continues to make slow progress at time of report. Her SO Melanie Madden has started hands on family training in prep for transitioning home next week. Pt currently requires Max A for stand pivot toilet transfers + toileting, and Max A for self care bedlevel or sit<stand. Continue POC.   Patient continues to demonstrate the following deficits: muscle weakness and muscle paralysis, decreased cardiorespiratoy endurance, abnormal tone, unbalanced muscle activation, decreased coordination and decreased motor planning, pusher syndrome, decreased midline orientation, decreased attention to right, right side neglect and ideational apraxia, decreased initiation, decreased attention, decreased awareness and decreased safety awareness and decreased sitting balance, decreased standing balance, decreased postural control and hemiplegia and therefore will continue to benefit from skilled OT intervention to enhance overall performance with BADL.  Patient progressing toward long term goals..  Continue plan of care.  OT Short Term Goals Week 3:  OT Short Term Goal 1 (Week 3): STGs=LTGs due to ELOS OT Short Term Goal 1 - Progress (Week 3): Progressing toward goal Week 4:  OT Short Term Goal 1 (Week 4): STGs=LTGs due to ELOS  Skilled Therapeutic Interventions/Progress Updates:    Pt greeted in w/c with SO Melanie Madden present for family education. Pt with increased agitation when presented with her lunch tray today. She started pushing SO away, yelling. Melanie Madden stated her  brief may be wet. Tried to redirect pt to toileting using toilet or brief change in bed however pt still with increased signs of physical agitation and still yelling. Transitioned to having Melanie Madden push pt in w/c up and down hallway for deescalation while OT printed off educational materials. When pt returned to the room, reviewed with Melanie Madden her R UE HEP. Demonstrated and verbalized proper techniques with pt allowing therapist to complete 3-5 reps of PROM exercises. We also went over hemi positioning in bed with paper handout as well. Pt at this time more amenable to complete toileting. Max A for stand pivot<elevated toilet. Pt initiated assisting with clothing mgt with Max balance assist in standing. She was unable to have a continent void and continued to attempt sit<stands. Note that her brief was saturated with urine and BM. Had SO provide close supervision-steady assist for sitting balance. When pt attempted to stand, SO cued her to sit and pt yelled "AH, AH," appearing more agitated. OT provided therapeutic use of self to calm pt so she'd allow therapist to complete hygiene and brief change. Pt standing with Max A to elevate brief over hips on Lt side. She refused to doff soiled pants, even with max encouragement from [name]. Openly discussed with Melanie Madden importance of redirection and "picking battles" for deescalation at home. Pt then ate her lunch with Melanie Madden providing setup and supervision assist while therapist observed. Pt did well with pacing during meal, resistant when OT tried to facilitate [facility] to incorporate affected UE to stabilize her berry container. Pt remained in w/c with Melanie Madden present. Her full lap tray was strapped to w/c and safety belt fastened. Notified RN to assist pt with changing her pants when pt allows  this.   Also during session, reviewed use of hospital bed functions for bathing/dressing from bedlevel at home. [name] with hands on practice by manually positioning bed in chair-like  positions and boosting positions. Discussed need for hospital bed at home and he reports he will set it up in the living room (which is w/c accessible). Bathroom is also w/c accessible. He is looking into having grab bars manually installed, recommended positioning grab bar on Lt side of toilet. Melanie Madden needs hands on practice with toileting and toilet transfers with pt to see if this is deemed safe at d/c or if bedlevel toileting is most appropriate for now.   Therapy Documentation Precautions:  Precautions Precautions: Fall, Other (comment) Precaution Comments: pusher, right hemiparesis Restrictions Weight Bearing Restrictions: No Pain: No s/s pain during tx    ADL:       Therapy/Group: Individual Therapy  [name] A [name] 08/14/2019, 4:08 PM

## 2019-08-15 ENCOUNTER — Inpatient Hospital Stay (HOSPITAL_COMMUNITY): Payer: Managed Care, Other (non HMO) | Admitting: Speech Pathology

## 2019-08-15 ENCOUNTER — Inpatient Hospital Stay (HOSPITAL_COMMUNITY): Payer: Managed Care, Other (non HMO) | Admitting: Physical Therapy

## 2019-08-15 DIAGNOSIS — D696 Thrombocytopenia, unspecified: Secondary | ICD-10-CM

## 2019-08-15 DIAGNOSIS — E8809 Other disorders of plasma-protein metabolism, not elsewhere classified: Secondary | ICD-10-CM

## 2019-08-15 DIAGNOSIS — E46 Unspecified protein-calorie malnutrition: Secondary | ICD-10-CM

## 2019-08-15 MED ORDER — PRO-STAT SUGAR FREE PO LIQD
30.0000 mL | Freq: Two times a day (BID) | ORAL | Status: DC
  Administered 2019-08-15 – 2019-08-21 (×12): 30 mL via ORAL
  Filled 2019-08-15 (×12): qty 30

## 2019-08-15 NOTE — Progress Notes (Signed)
[place] PHYSICAL MEDICINE & REHABILITATION PROGRESS NOTE  Subjective/Complaints: Patient seen laying in bed this morning.  No reported issues overnight.  ROS: limited due to language/cognition  Objective: Vital Signs: Blood pressure (!) 150/96, pulse (!) 102, temperature 98.4 F (36.9 C), resp. rate 18, height 5\' 7"  (1.702 m), weight 57.7 kg, SpO2 99 %. No results found. No results for input(s): WBC, HGB, HCT, PLT in the last 72 hours. No results for input(s): NA, K, CL, CO2, GLUCOSE, BUN, CREATININE, CALCIUM in the last 72 hours.  Physical Exam: BP (!) 150/96 (BP Location: Right Arm)   Pulse (!) 102   Temp 98.4 F (36.9 C)   Resp 18   Ht 5\' 7"  ([id number] m)   Wt 57.7 kg   SpO2 99%   BMI 19.92 kg/m  Constitutional: No distress . Vital signs reviewed. HENT: Normocephalic.  Atraumatic. Eyes: EOMI. No discharge. Cardiovascular: No JVD. Respiratory: Normal effort.  No stridor. GI: Non-distended. Skin: Warm and dry.  Intact. Psych: Difficult to assess due to mentation Musc: No edema in extremities.  No tenderness in extremities. Neuro: Alert Global aphasia Motor: Limited due to ability to follow commands, however spontaneously moving left side, no movement noted on right side   Assessment/Plan: 1. Functional deficits secondary to left basal ganglia hypertensive hemorrhage which require 3+ hours per day of interdisciplinary therapy in a comprehensive inpatient rehab setting.  Physiatrist is providing close team supervision and 24 hour management of active medical problems listed below.  Physiatrist and rehab team continue to assess barriers to discharge/monitor patient progress toward functional and medical goals  Care Tool:  Bathing    Body parts bathed by patient: Face, Chest, Abdomen, Front perineal area   Body parts bathed by helper: Right arm, Left arm, Buttocks, Right upper leg, Left upper leg, Right lower leg, Left lower leg     Bathing assist Assist Level:  Maximal Assistance - Patient 24 - 49%     Upper Body Dressing/Undressing Upper body dressing   What is the patient wearing?: Pull over shirt    Upper body assist Assist Level: Moderate Assistance - Patient 50 - 74%    Lower Body Dressing/Undressing Lower body dressing      What is the patient wearing?: Pants, Incontinence brief     Lower body assist Assist for lower body dressing: Total Assistance - Patient < 25%     Toileting Toileting    Toileting assist Assist for toileting: Maximal Assistance - Patient 25 - 49%     Transfers Chair/bed transfer  Transfers assist  Chair/bed transfer activity did not occur: Safety/medical concerns  Chair/bed transfer assist level: Maximal Assistance - Patient 25 - 49%     Locomotion Ambulation   Ambulation assist   Ambulation activity did not occur: Safety/medical concerns  Assist level: 2 helpers Assistive device: Walker-hemi Max distance: 20   Walk 10 feet activity   Assist  Walk 10 feet activity did not occur: Safety/medical concerns  Assist level: 2 helpers Assistive device: Walker-hemi   Walk 50 feet activity   Assist Walk 50 feet with 2 turns activity did not occur: Safety/medical concerns         Walk 150 feet activity   Assist Walk 150 feet activity did not occur: Safety/medical concerns         Walk 10 feet on uneven surface  activity   Assist Walk 10 feet on uneven surfaces activity did not occur: Safety/medical concerns  Wheelchair     Assist Will patient use wheelchair at discharge?: Yes Type of Wheelchair: Manual    Wheelchair assist level: Supervision/Verbal cueing Max wheelchair distance: 150 ft    Wheelchair 50 feet with 2 turns activity    Assist        Assist Level: Supervision/Verbal cueing   Wheelchair 150 feet activity     Assist     Assist Level: Supervision/Verbal cueing      Medical Problem List and Plan: 1.  Right side weakness  and aphasia secondary to left basal ganglia hypertensive hemorrhage  Continue CIR 2.  Antithrombotics: -DVT/anticoagulation: Subcutaneous heparin initiated 07/14/2019.  Monitor for any bleeding episodes-changed to enoxaparin to reduce freq of injection              -antiplatelet therapy: N/A  3. Pain Management: Tylenol as needed 4. Mood: Provide emotional support  Antipsychotic agents: N/A  Ritalin increased to 10 mg  5. Neuropsych: This patient is not capable of making decisions on her own behalf. 6. Skin/Wound Care: Routine skin checks 7. Fluids/Electrolytes/Nutrition: Routine in and outs.  8.  Hypertension.  Lisinopril 20 mg twice daily, Norvasc 10 mg daily.               Vitals:   08/15/19 0547 08/15/19 1415  BP: 136/88 (!) 150/96  Pulse: 86 (!) 102  Resp: 19 18  Temp: 98.4 F (36.9 C) 98.4 F (36.9 C)  SpO2: 97% 99%   Slightly elevated 1/16, will consider further medication adjustments if persistent 9.  Hypernatremia-resolved 10.  Leukocytosis             Resolved  11.  Transaminasemia-resolved  12.  Aphasia as well as speech apraxia  13.  Thrombocytopenia  Platelets 138 on 1/11, labs ordered for Monday 14.  Hypoalbuminemia  Supplement initiated on 1/11  LOS: 28 days A FACE TO FACE EVALUATION WAS PERFORMED  Melanie Madden Phenix 08/15/2019, 3:42 PM

## 2019-08-15 NOTE — Progress Notes (Signed)
Speech Language Pathology Daily Session Note  Patient Details  Name: Melanie Madden MRN: [id number] Date of Birth: May 31, 1966  Today's Date: 08/15/2019 SLP Individual Time: 1405-1430 SLP Individual Time Calculation (min): 25 min  Short Term Goals: Week 4: SLP Short Term Goal 1 (Week 4): STG=LTG due to short ELOS  Skilled Therapeutic Interventions:  Pt was seen for skilled ST targeting dysphagia and communication goals.  SLP facilitated the session with a functional snack of dys 2 textures and thin liquids to assess toleration of pt's currently prescribed diet.  Pt consumed snack with supervision cues for use of swallowing precautions and no overt s/s of aspiration with solids or liquids.  She politely declined trials of advanced solids on this date.  Throughout the course of today's therapy session, pt was able to convey basic, concrete choices via eye gaze when provided with two options with mod-max assist multimodal cues.  Pt corrected an error when the therapist brought her the wrong snack item with mod assist question cues/choice of two.  Pt was left in wheelchair with chair alarm set.  Continue per current plan of care.    Pain Pain Assessment Pain Scale: 0-10 Pain Score: 0-No pain  Therapy/Group: Individual Therapy  Melanie Madden, Selinda Orion 08/15/2019, 2:53 PM

## 2019-08-15 NOTE — Progress Notes (Signed)
Physical Therapy Session Note  Patient Details  Name: Melanie Madden MRN: [id number] Date of Birth: [birth year]  Today's Date: 08/15/2019 PT Individual Time: 0900-1005 PT Individual Time Calculation (min): 65 min   Short Term Goals: Week 4:  PT Short Term Goal 1 (Week 4): Pt will maintain standing balacne with mod assist and LRAD up to 1 minute PT Short Term Goal 2 (Week 4): Pt will propell WC 185f with min assist PT Short Term Goal 3 (Week 4): Pt [facility] of 1 person consistently  Skilled Therapeutic Interventions/Progress Updates:   Pt received supine in bed and agreeable to PT. Supine>long sitting in bed with supervision assist. Pt restlessly pointing to bathroom. While PT setting up room pt lost balance while sitting in bed and hit head on padded bed rail. Long sitting to sitting EOB with mod assist for RLE management and safety to prevent sit>stand prior to proper set up from PT. Stand [facility] and blocking the RLE from buckling with turn. Toilet transfer with mod assist to stabilize the R knee and support trunk, as well as heavy use of rail with the LUE. Pt able to void bowel and bladder on toilet, but was noted to have incontinent bladder prior to toiletting. PT performed peri can and clothing management while Pt maintain balance with mod assist and LEU supported on rail  WC mobility with supervision assist and hemi technique x 2017f Min cues for doorway management.    Sit<>stand from [facility] x 4 with HW on the L. Attempted to achieve tall kneeling on mat table, but pain in the L knee prevent ant sustained time in this positin.   PT instructed pt in step management to ascend with the LLE and descend with the RLE, max-total A to prevnt LOB with descent on the RLE due to poor awareness of RLE placement and no activation of hip or knee extension noted.   Patient returned to room and left sitting in [facility]  in reach and all needs met.       Therapy Documentation Precautions:  Precautions Precautions: Fall, Other (comment) Precaution Comments: pusher, right hemiparesis Restrictions Weight Bearing Restrictions: No    Pain: Pain Assessment Pain Scale: (P) Faces Pain Score: (P) 0-No pain    Therapy/Group: Individual Therapy  AuLorie Phenix/16/2021, 10:09 AM

## 2019-08-16 DIAGNOSIS — D62 Acute posthemorrhagic anemia: Secondary | ICD-10-CM

## 2019-08-16 NOTE — Progress Notes (Signed)
[place] PHYSICAL MEDICINE & REHABILITATION PROGRESS NOTE  Subjective/Complaints: Patient seen laying in bed this morning.  No reported issues overnight.  Remains aphasic.  ROS: limited due to language/cognition  Objective: Vital Signs: Blood pressure (!) 147/94, pulse 78, temperature 97.6 F (36.4 C), resp. rate 18, height 5\' 7"  (1.702 m), weight 57.7 kg, SpO2 99 %. No results found. No results for input(s): WBC, HGB, HCT, PLT in the last 72 hours. No results for input(s): NA, K, CL, CO2, GLUCOSE, BUN, CREATININE, CALCIUM in the last 72 hours.  Physical Exam: BP (!) 147/94 (BP Location: Left Arm)   Pulse 78   Temp 97.6 F (36.4 C)   Resp 18   Ht 5\' 7"  (1.702 m)   Wt 57.7 kg   SpO2 99%   BMI 19.92 kg/m  Constitutional: No distress . Vital signs reviewed. HENT: Normocephalic.  Atraumatic. Eyes: EOMI. No discharge. Cardiovascular: No JVD. Respiratory: Normal effort.  No stridor. GI: Non-distended. Skin: Warm and dry.  Intact. Psych: Normal mood.  Normal behavior. Musc: No edema in extremities.  No tenderness in extremities. Neuro: Alert Global aphasia Motor: Limited due to ability to follow commands, however spontaneously moving left side, no movement noted on right side, unchanged  Assessment/Plan: 1. Functional deficits secondary to left basal ganglia hypertensive hemorrhage which require 3+ hours per day of interdisciplinary therapy in a comprehensive inpatient rehab setting.  Physiatrist is providing close team supervision and 24 hour management of active medical problems listed below.  Physiatrist and rehab team continue to assess barriers to discharge/monitor patient progress toward functional and medical goals  Care Tool:  Bathing    Body parts bathed by patient: Face, Chest, Abdomen, Front perineal area   Body parts bathed by helper: Right arm, Left arm, Buttocks, Right upper leg, Left upper leg, Right lower leg, Left lower leg     Bathing assist Assist  Level: Maximal Assistance - Patient 24 - 49%     Upper Body Dressing/Undressing Upper body dressing   What is the patient wearing?: Pull over shirt    Upper body assist Assist Level: Moderate Assistance - Patient 50 - 74%    Lower Body Dressing/Undressing Lower body dressing      What is the patient wearing?: Pants, Incontinence brief     Lower body assist Assist for lower body dressing: Total Assistance - Patient < 25%     Toileting Toileting    Toileting assist Assist for toileting: Maximal Assistance - Patient 25 - 49%     Transfers Chair/bed transfer  Transfers assist  Chair/bed transfer activity did not occur: Safety/medical concerns  Chair/bed transfer assist level: Moderate Assistance - Patient 50 - 74%     Locomotion Ambulation   Ambulation assist   Ambulation activity did not occur: Safety/medical concerns  Assist level: 2 helpers Assistive device: Walker-hemi Max distance: 20   Walk 10 feet activity   Assist  Walk 10 feet activity did not occur: Safety/medical concerns  Assist level: 2 helpers Assistive device: Walker-hemi   Walk 50 feet activity   Assist Walk 50 feet with 2 turns activity did not occur: Safety/medical concerns         Walk 150 feet activity   Assist Walk 150 feet activity did not occur: Safety/medical concerns         Walk 10 feet on uneven surface  activity   Assist Walk 10 feet on uneven surfaces activity did not occur: Safety/medical concerns  Wheelchair     Assist Will patient use wheelchair at discharge?: Yes Type of Wheelchair: Manual    Wheelchair assist level: Supervision/Verbal cueing Max wheelchair distance: 160ft    Wheelchair 50 feet with 2 turns activity    Assist        Assist Level: Supervision/Verbal cueing   Wheelchair 150 feet activity     Assist     Assist Level: Supervision/Verbal cueing      Medical Problem List and Plan: 1.  Right side  weakness and aphasia secondary to left basal ganglia hypertensive hemorrhage  Continue CIR 2.  Antithrombotics: -DVT/anticoagulation: Subcutaneous heparin initiated 07/14/2019.  Monitor for any bleeding episodes-changed to enoxaparin to reduce freq of injection              -antiplatelet therapy: N/A  3. Pain Management: Tylenol as needed 4. Mood: Provide emotional support  Antipsychotic agents: N/A  Ritalin increased to 10 mg  5. Neuropsych: This patient is not capable of making decisions on her own behalf. 6. Skin/Wound Care: Routine skin checks 7. Fluids/Electrolytes/Nutrition: Routine in and outs.  8.  Hypertension.  Lisinopril 20 mg twice daily, Norvasc 10 mg daily.               Vitals:   08/16/19 1437 08/16/19 1942  BP: (!) 141/90 (!) 147/94  Pulse: (!) 101 78  Resp: 20 18  Temp: 98.4 F (36.9 C) 97.6 F (36.4 C)  SpO2: 100% 99%   Slightly elevated 1/17, consider further medication adjustments if persistent 9.  Hypernatremia-resolved 10.  Leukocytosis             Resolved  11.  Transaminasemia-resolved  12.  Aphasia as well as speech apraxia  13.  Thrombocytopenia  Platelets 138 on 1/11, labs ordered for tomorrow 14.  Hypoalbuminemia  Supplement initiated on 1/11 15.  Acute blood loss anemia  Hemoglobin 11.8 on 1/11, labs ordered for tomorrow  LOS: 29 days A FACE TO FACE EVALUATION WAS PERFORMED  Gearld Kerstein Lorie Phenix 08/16/2019, 9:42 PM

## 2019-08-16 NOTE — Progress Notes (Signed)
Social Work Patient ID: Samul Dada, female   DOB: 05/07/1966, 54 y.o.   MRN: [id number]  Met with pt and spouse on Friday to discuss dc preparations.  Spouse aware that team continues to target d/c date of 1/22.  Reviewed DME and HH that I am arranging.  Spouse reports he plans to take 3 weeks off from work and that a friend will be coming to stay with them for 2-3 weeks as well.  He has not secured private duty caregiver yet.  Stressed to him that she will obviously need 24/7 care for an indefinite period of time and he understands this, however, private caregiver cost is a potential financial barrier.  I continue to seek out any other options, however, adult day programs can be prohibitive due to her age and due to there [place] restrictions (one is not currently operating).  Will continue to follow.  Lurae Hornbrook, LCSW

## 2019-08-17 ENCOUNTER — Inpatient Hospital Stay (HOSPITAL_COMMUNITY): Payer: Managed Care, Other (non HMO) | Admitting: Occupational Therapy

## 2019-08-17 ENCOUNTER — Inpatient Hospital Stay (HOSPITAL_COMMUNITY): Payer: Managed Care, Other (non HMO) | Admitting: Physical Therapy

## 2019-08-17 ENCOUNTER — Inpatient Hospital Stay (HOSPITAL_COMMUNITY): Payer: Managed Care, Other (non HMO) | Admitting: Speech Pathology

## 2019-08-17 LAB — CBC WITH DIFFERENTIAL/PLATELET
Abs Immature Granulocytes: 0.04 10*3/uL (ref 0.00–0.07)
Basophils Absolute: 0 10*3/uL (ref 0.0–0.1)
Basophils Relative: 1 %
Eosinophils Absolute: 0.1 10*3/uL (ref 0.0–0.5)
Eosinophils Relative: 2 %
HCT: 36.3 % (ref 36.0–46.0)
Hemoglobin: 12 g/dL (ref 12.0–15.0)
Immature Granulocytes: 1 %
Lymphocytes Relative: 39 %
Lymphs Abs: 1.5 10*3/uL (ref 0.7–4.0)
MCH: 30.3 pg (ref 26.0–34.0)
MCHC: 33.1 g/dL (ref 30.0–36.0)
MCV: 91.7 fL (ref 80.0–100.0)
Monocytes Absolute: 0.4 10*3/uL (ref 0.1–1.0)
Monocytes Relative: 11 %
Neutro Abs: 1.7 10*3/uL (ref 1.7–7.7)
Neutrophils Relative %: 46 %
Platelets: 143 10*3/uL — ABNORMAL LOW (ref 150–400)
RBC: 3.96 MIL/uL (ref 3.87–5.11)
RDW: 15.1 % (ref 11.5–15.5)
WBC: 3.7 10*3/uL — ABNORMAL LOW (ref 4.0–10.5)
nRBC: 0 % (ref 0.0–0.2)

## 2019-08-17 LAB — COMPREHENSIVE METABOLIC PANEL
ALT: 37 U/L (ref 0–44)
AST: 17 U/L (ref 15–41)
Albumin: 2.7 g/dL — ABNORMAL LOW (ref 3.5–5.0)
Alkaline Phosphatase: 49 U/L (ref 38–126)
Anion gap: 10 (ref 5–15)
BUN: 14 mg/dL (ref 6–20)
CO2: 26 mmol/L (ref 22–32)
Calcium: 9.6 mg/dL (ref 8.9–10.3)
Chloride: 103 mmol/L (ref 98–111)
Creatinine, Ser: 0.54 mg/dL (ref 0.44–1.00)
GFR calc Af Amer: >60 mL/min (ref >60)
GFR calc non Af Amer: >60 mL/min (ref >60)
Glucose, Bld: 100 mg/dL — ABNORMAL HIGH (ref 70–99)
Potassium: 3.8 mmol/L (ref 3.5–5.1)
Sodium: 139 mmol/L (ref 135–145)
Total Bilirubin: 0.6 mg/dL (ref 0.3–1.2)
Total Protein: 5.5 g/dL — ABNORMAL LOW (ref 6.5–8.1)

## 2019-08-17 MED ORDER — MEDROXYPROGESTERONE ACETATE 150 MG/ML IM SUSP
150.0000 mg | Freq: Once | INTRAMUSCULAR | Status: AC
  Administered 2019-08-17: 150 mg via INTRAMUSCULAR
  Filled 2019-08-17: qty 1

## 2019-08-17 NOTE — Progress Notes (Signed)
Occupational Therapy Session Note  Patient Details  Name: Melanie Madden MRN: [id number] Date of Birth: 1966/06/22  Today's Date: 08/17/2019 OT Individual Time: [id number] OT Individual Time Calculation (min): 69 min    Short Term Goals: Week 4:  OT Short Term Goal 1 (Week 4): STGs=LTGs due to ELOS  Skilled Therapeutic Interventions/Progress Updates:    Upon entering the room, pt supine in bed with no signs, symptoms, or c/o pain this session. OT assisted pt with removal of brief at bed level and pt cleaning LB with mod multimodal cuing. Pt rolling self in bed to wash buttocks as well. Pt donning LB clothing with assistance to thread B feet and she pulls up legs and over B hips with roll and bridge. Supine >sit with min A to EOB. Pt transferred from bed >wheelchair with mod A stand pivot transfer. Pt engaged in grooming tasks at sink with set up A and encouragement. Pt remained in wheelchair at end of session with lap tray donned awaiting breakfast tray. OT providing supervision for pt to drink water and juice with no coughing noted. Min cues for smaller sips. Call bell and all needed items within reach.   Therapy Documentation Precautions:  Precautions Precautions: Fall, Other (comment) Precaution Comments: pusher, right hemiparesis Restrictions Weight Bearing Restrictions: No General:   Vital Signs: Therapy Vitals Temp: 97.8 F (36.6 C) Temp Source: Oral Pulse Rate: 74 Resp: 16 BP: 125/83 Patient Position (if appropriate): Lying Oxygen Therapy SpO2: 97 % O2 Device: Room Air   Therapy/Group: Individual Therapy  Gypsy Decant 08/17/2019, 8:54 AM

## 2019-08-17 NOTE — Plan of Care (Signed)
Goals downgraded to max A for sitting balance, standing balance,sit <>stand, LB dressing, toileting, and toilet transfer. Goal for shower transfer discontinued secondary to safety concerns and not recommended to do at discharge with spouse.

## 2019-08-17 NOTE — Progress Notes (Signed)
Physical Therapy Session Note  Patient Details  Name: Melanie Madden MRN: [id number] Date of Birth: [birth year]  Today's Date: 08/17/2019 PT Individual Time: [id number] PT Individual Time Calculation (min): 57 min   Short Term Goals: Week 4:  PT Short Term Goal 1 (Week 4): Pt will maintain standing balacne with mod assist and LRAD up to 1 minute PT Short Term Goal 2 (Week 4): Pt will propell WC 123ft with min assist PT Short Term Goal 3 (Week 4): Pt will transfer to and from [facility] with mod assist of 1 person consistently  Skilled Therapeutic Interventions/Progress Updates: Pt presented in w/c at nsg station agreeable to therapy. Performed blocked practice transfer to lowered flat hospital bed with pt requiring max A for stand pivot to bed. With bed flat modA sit to supine with maxA for RLE management. Pt was mod to maxA supine to sit for RLE management and truncal support. Pt demonstrated SBA once sitting EOB and noted to be less impulsive with mobility during this activity. Pt then transferred back to w/c maxA stand pivot. Pt transported to ADL apt and performed stand pivot to standard bed maxA. Performed sit to supine on L side of bed maxA and required modA for supine to sit as pt was able to initiate a sit up to facilitate supine to sit with PTA providing total A for RLE management. Pt transferred back to w/c maxA stand pivot. Pt then propelled supervision to day room and participated in Cybex Kinetron 50cm/sec 3 bouts to fatigue (approx 30 cycles). Pt then participated in gait at wall rail (due to safety). Pt ambulated maxA approx 69ft with PTA providing total A to advance, blocking R knee and providing manual facilitation to wt shift to R. Pt returned to w/c once completed and transported back to nsg station at end of session with half lap tray placed and nsg staff aware.      Therapy Documentation Precautions:  Precautions Precautions: Fall, Other (comment) Precaution Comments: pusher, right  hemiparesis Restrictions Weight Bearing Restrictions: No   Therapy/Group: Individual Therapy  [name]  Veryl Winemiller, PTA  08/17/2019, 3:33 PM

## 2019-08-17 NOTE — Progress Notes (Signed)
Speech Language Pathology Daily Session Note  Patient Details  Name: Melanie Madden MRN: [id number] Date of Birth: Jul 06, 1966  Today's Date: 08/17/2019 SLP Individual Time: [id number] SLP Individual Time Calculation (min): 58 min  Short Term Goals: Week 4: SLP Short Term Goal 1 (Week 4): STG=LTG due to short ELOS  Skilled Therapeutic Interventions: Pt was seen for skilled ST targeting dysphagia and cognitive-linguistic goals. SLP facilitated session with set up assist and overall Min A verbal cues for reducing impulsivity - use of small bites and sips and slow rate during Dys 2/thin breakfast meal. No overt s/sx aspiration observed throughout intake and efficient mastication and oral clearance achieved. Min A verbal/visual cues also provided for problem solving during meal intake. During upgraded trial of Dys 3 solids, pt required Mod A verbal cues for small bites and slow rate. Slightly prolonged mastication observed, but total oral clearance achieved. Recommend continue current diet. Pt attempted to express unknown message, eventually with Max A cues and question reframed as yes/no, SLP determined pt needed to void. Pt required Max A multimodal cues for safety awareness, problem solving, and reducing impulsivity during transfer from wheelchair to stedy. Pt found to be incontinent of bladder when she reached the toilet. Once clean and dry, new brief donned NT and SLP transferred pt back to wheelchair via stedy. Pt oriented to date by pointing to 08/17/19 on a monthly calendar with only Supervision A question cues. Pt completed basic tasks such as clearing and wiping her tray clean with Min A verbal and visual cues from SLP. Of note, pt also answered yes/no questions via gestures (head nods/shakes and occasionally thumbs up) with ~50% accuracy throughout session to communicate basic wants and needs. Pt left sitting in wheelchair with alarm set and needs within reach. Continue per current plan of care.         Pain Pain Assessment Pain Scale: Faces Faces Pain Scale: Hurts a little bit Pain Type: Acute pain Pain Location: Back Pain Orientation: Medial Pain Descriptors / Indicators: Grimacing Pain Intervention(s): Refused  Therapy/Group: Individual Therapy  Melanie Madden 08/17/2019, 9:15 AM

## 2019-08-17 NOTE — Progress Notes (Signed)
[place] PHYSICAL MEDICINE & REHABILITATION PROGRESS NOTE  Subjective/Complaints:  Working with SLP  Remains aphasic   ROS: limited due to language/cognition  Objective: Vital Signs: Blood pressure 125/83, pulse 74, temperature 97.8 F (36.6 C), temperature source Oral, resp. rate 16, height 5\' 7"  (1.702 m), weight 57.7 kg, SpO2 97 %. No results found. Recent Labs    08/17/19 0614  WBC 3.7*  HGB 12.0  HCT 36.3  PLT 143*   Recent Labs    08/17/19 0614  NA 139  K 3.8  CL 103  CO2 26  GLUCOSE 100*  BUN 14  CREATININE 0.54  CALCIUM 9.6    Physical Exam: BP 125/83 (BP Location: Left Arm)   Pulse 74   Temp 97.8 F (36.6 C) (Oral)   Resp 16   Ht 5\' 7"  (1.702 m)   Wt 57.7 kg   SpO2 97%   BMI 19.92 kg/m  Constitutional: No distress . Vital signs reviewed. HENT: Normocephalic.  Atraumatic. Eyes: EOMI. No discharge. Cardiovascular: No JVD. Respiratory: Normal effort.  No stridor. GI: Non-distended. Skin: Warm and dry.  Intact. Psych: Normal mood.  Normal behavior. Musc: No edema in extremities.  No tenderness in extremities. Neuro: Alert Global aphasia Motor: Limited due to ability to follow commands, however spontaneously moving left side, no movement noted on right side, unchanged  Assessment/Plan: 1. Functional deficits secondary to left basal ganglia hypertensive hemorrhage which require 3+ hours per day of interdisciplinary therapy in a comprehensive inpatient rehab setting.  Physiatrist is providing close team supervision and 24 hour management of active medical problems listed below.  Physiatrist and rehab team continue to assess barriers to discharge/monitor patient progress toward functional and medical goals  Care Tool:  Bathing    Body parts bathed by patient: Buttocks, Front perineal area, Abdomen, Chest, Face, Right upper leg, Left upper leg   Body parts bathed by helper: Right lower leg, Left lower leg, Left arm, Right arm     Bathing  assist Assist Level: Moderate Assistance - Patient 50 - 74%     Upper Body Dressing/Undressing Upper body dressing   What is the patient wearing?: Pull over shirt    Upper body assist Assist Level: Moderate Assistance - Patient 50 - 74%    Lower Body Dressing/Undressing Lower body dressing      What is the patient wearing?: Pants, Incontinence brief     Lower body assist Assist for lower body dressing: Maximal Assistance - Patient 25 - 49%     Toileting Toileting    Toileting assist Assist for toileting: Maximal Assistance - Patient 25 - 49%     Transfers Chair/bed transfer  Transfers assist  Chair/bed transfer activity did not occur: Safety/medical concerns  Chair/bed transfer assist level: Maximal Assistance - Patient 25 - 49%     Locomotion Ambulation   Ambulation assist   Ambulation activity did not occur: Safety/medical concerns  Assist level: 2 helpers Assistive device: Walker-hemi Max distance: 20   Walk 10 feet activity   Assist  Walk 10 feet activity did not occur: Safety/medical concerns  Assist level: 2 helpers Assistive device: Walker-hemi   Walk 50 feet activity   Assist Walk 50 feet with 2 turns activity did not occur: Safety/medical concerns         Walk 150 feet activity   Assist Walk 150 feet activity did not occur: Safety/medical concerns         Walk 10 feet on uneven surface  activity  Assist Walk 10 feet on uneven surfaces activity did not occur: Safety/medical concerns         Wheelchair     Assist Will patient use wheelchair at discharge?: Yes Type of Wheelchair: Manual    Wheelchair assist level: Supervision/Verbal cueing Max wheelchair distance: 15ft    Wheelchair 50 feet with 2 turns activity    Assist        Assist Level: Supervision/Verbal cueing   Wheelchair 150 feet activity     Assist     Assist Level: Supervision/Verbal cueing      Medical Problem List and  Plan: 1.  Right side weakness and aphasia secondary to left basal ganglia hypertensive hemorrhage  Continue CIR PT, OT, SLP  2.  Antithrombotics: -DVT/anticoagulation: Subcutaneous heparin initiated 07/14/2019.  Monitor for any bleeding episodes-changed to enoxaparin to reduce freq of injection              -antiplatelet therapy: N/A  3. Pain Management: Tylenol as needed 4. Mood: Provide emotional support  Antipsychotic agents: N/A  Ritalin increased to 10 mg  5. Neuropsych: This patient is not capable of making decisions on her own behalf. 6. Skin/Wound Care: Routine skin checks 7. Fluids/Electrolytes/Nutrition: Routine in and outs.  8.  Hypertension.  Lisinopril 20 mg twice daily, Norvasc 10 mg daily.               Vitals:   08/16/19 1942 08/17/19 0539  BP: (!) 147/94 125/83  Pulse: 78 74  Resp: 18 16  Temp: 97.6 F (36.4 C) 97.8 F (36.6 C)  SpO2: 99% 97%   Slightly elevated 1/17, consider further medication adjustments if persistent 9.  Hypernatremia-resolved 10.  Leukocytosis             Resolved  11.  Transaminasemia-resolved  12.  Aphasia as well as speech apraxia  13.  Thrombocytopenia  Platelets 138 on 1/11, labs ordered for tomorrow 14.  Hypoalbuminemia  Supplement initiated on 1/11 15.  Acute blood loss anemia  Hemoglobin 11.8 on 1/11, labs ordered for tomorrow  LOS: 30 days [name] 08/17/2019, 8:57 AM

## 2019-08-17 NOTE — Plan of Care (Signed)
  Problem: Consults Goal: RH STROKE PATIENT EDUCATION Description: See Patient Education module for education specifics  Outcome: Progressing Goal: Nutrition Consult-if indicated Outcome: Progressing   Problem: RH BOWEL ELIMINATION Goal: RH STG MANAGE BOWEL WITH ASSISTANCE Description: STG Manage Bowel with min Assistance. Outcome: Progressing Goal: RH STG MANAGE BOWEL W/MEDICATION W/ASSISTANCE Description: STG Manage Bowel with Medication with min Assistance. Outcome: Progressing   Problem: RH SKIN INTEGRITY Goal: RH STG MAINTAIN SKIN INTEGRITY WITH ASSISTANCE Description: STG Maintain Skin Integrity With min Assistance. Outcome: Progressing   Problem: RH SAFETY Goal: RH STG ADHERE TO SAFETY PRECAUTIONS W/ASSISTANCE/DEVICE Description: STG Adhere to Safety Precautions With min Assistance and appropriate assistive Device. Outcome: Progressing   Problem: RH COGNITION-NURSING Goal: RH STG ANTICIPATES NEEDS/CALLS FOR ASSIST W/ASSIST/CUES Description: STG Anticipates Needs/Calls for Assist With min Assistance and verbal Cues. Outcome: Progressing   Problem: RH PAIN MANAGEMENT Goal: RH STG PAIN MANAGED AT OR BELOW PT'S PAIN GOAL Description: <3 on a 0-10 pain scale Outcome: Progressing   Problem: RH KNOWLEDGE DEFICIT Goal: RH STG INCREASE KNOWLEDGE OF DIABETES Description: Patient and caregiver will be able to demonstrate knowledge of diabetes medications, dietary restrictions, and follow care with the MD post discharge with min assist from staff. Outcome: Progressing Goal: RH STG INCREASE KNOWLEDGE OF HYPERTENSION Description: Patient and caregiver will demonstrate knowledge of HTN medications, dietary restrictions, and follow up care with the MD post discharge with min assist from staff, Outcome: Progressing Goal: RH STG INCREASE KNOWLEDGE OF DYSPHAGIA/FLUID INTAKE Description: Patient and caregiver will increase knowledge on dysphagia diet, thickened liquids, and increased  fluid intake with min assist from staff. Outcome: Progressing Goal: RH STG INCREASE KNOWLEGDE OF HYPERLIPIDEMIA Description: Patient and caregiver will demonstrate knowledge of HLD medications, dietary restrictions, and follow up care with the MD post discharge with min assist from staff. Outcome: Progressing Goal: RH STG INCREASE KNOWLEDGE OF STROKE PROPHYLAXIS Description: Patient and caregiver with demonstrate knowledge of medications used to prevent future strokes with min assist from staff. Outcome: Progressing   Problem: RH BLADDER ELIMINATION Goal: RH STG MANAGE BLADDER WITH ASSISTANCE Description: STG Manage Bladder With min Assistance Outcome: Not Progressing

## 2019-08-17 NOTE — Progress Notes (Signed)
Received call from patients sister today requesting a family meeting with the doctor regarding Nicoles care.  Sister is not listed on the patients chart as being a contact.  Notified caller she needed to touch base with patients husband regarding updates.  Later, spoke with patients husband who said it was ok to put patients sister on the contact list and verified that it was ok to provide updates to her.  He also warned that she may be "problematic" and "make numerous requests" from [place] (I believe he said the sister lives in [place]).  I added sister to patients contact list per his request.  Brita Romp, RN

## 2019-08-18 ENCOUNTER — Inpatient Hospital Stay (HOSPITAL_COMMUNITY): Payer: Managed Care, Other (non HMO)

## 2019-08-18 ENCOUNTER — Inpatient Hospital Stay (HOSPITAL_COMMUNITY): Payer: Managed Care, Other (non HMO) | Admitting: Physical Therapy

## 2019-08-18 ENCOUNTER — Inpatient Hospital Stay (HOSPITAL_COMMUNITY): Payer: Managed Care, Other (non HMO) | Admitting: Occupational Therapy

## 2019-08-18 NOTE — Progress Notes (Signed)
Physical Therapy Session Note  Patient Details  Name: Melanie Madden MRN: [id number] Date of Birth: [birth year]  Today's Date: 08/18/2019 PT Individual Time: 1105-1205 PT Individual Time Calculation (min): 60 min   Short Term Goals: Week 4:  PT Short Term Goal 1 (Week 4): Pt will maintain standing balacne with mod assist and LRAD up to 1 minute PT Short Term Goal 2 (Week 4): Pt will propell WC 170ft with min assist PT Short Term Goal 3 (Week 4): Pt will transfer to and from [facility] with mod assist of 1 person consistently  Skilled Therapeutic Interventions/Progress Updates: Pt presented in w/c with husband [name] present agreeable to therapy. Session to focus on functional transfers. Pt transported to ortho gym and problem solved car transfer, noting pt's resistance to use of SB. As pt transferring to vehicle with slight uphill but grab bar present, agreeable to perform stand pivot with pt holding onto grab handle. Pt was able to perform following cues and requiring overall modA. Once pt in car decided ultimately safest to perform squat pivot to return to w/c. Pt with increased impulsive behaviors when attempting transfer, performing multiple attempts to stand with PTA blocking pt as pt would attempt to turn to L. Pt ultimately returned to car with changing to try SB. [name] then attempted to communicate with pt performing SB to w/c. Pt then trying to reach for R arm rest but turning to L. PTA and husband blocked pt but was able to shift hips to R for maxA SB transfer to w/c. Due to pt's impulsive behaviors PTA asked pt if needed to use bathroom with pt emphatically nodding head "yes". Pt then transported back to room and performed stand pivot to toilet with modA and use of wall rail. Pt with almost immediate continent void. Pt was then encouraged pt perform own peri-care when handed washcloth. Pt then performed stand pivot back to w/c with modA and husband assisting with pulling up brief and shorts. Pt set up in  w/c with full lap tray and belt alarm with husband present.      Therapy Documentation Precautions:  Precautions Precautions: Fall, Other (comment) Precaution Comments: pusher, right hemiparesis Restrictions Weight Bearing Restrictions: No General:   Vital Signs: Therapy Vitals Temp: 98.7 F (37.1 C) Pulse Rate: 91 Resp: 18 BP: 139/86 Patient Position (if appropriate): Sitting Oxygen Therapy SpO2: 100 % O2 Device: Room Air Pain:   Mobility:   Locomotion :    Trunk/Postural Assessment :    Balance:   Exercises:   Other Treatments:      Therapy/Group: Individual Therapy  Tullio Chausse 08/18/2019, 4:38 PM

## 2019-08-18 NOTE — Plan of Care (Signed)
  Problem: Consults Goal: RH STROKE PATIENT EDUCATION Description: See Patient Education module for education specifics  Outcome: Progressing Goal: Nutrition Consult-if indicated Outcome: Progressing   Problem: RH BOWEL ELIMINATION Goal: RH STG MANAGE BOWEL WITH ASSISTANCE Description: STG Manage Bowel with min Assistance. Outcome: Progressing Goal: RH STG MANAGE BOWEL W/MEDICATION W/ASSISTANCE Description: STG Manage Bowel with Medication with min Assistance. Outcome: Progressing   Problem: RH BLADDER ELIMINATION Goal: RH STG MANAGE BLADDER WITH ASSISTANCE Description: STG Manage Bladder With min Assistance Outcome: Progressing   Problem: RH SKIN INTEGRITY Goal: RH STG MAINTAIN SKIN INTEGRITY WITH ASSISTANCE Description: STG Maintain Skin Integrity With min Assistance. Outcome: Progressing   Problem: RH SAFETY Goal: RH STG ADHERE TO SAFETY PRECAUTIONS W/ASSISTANCE/DEVICE Description: STG Adhere to Safety Precautions With min Assistance and appropriate assistive Device. Outcome: Progressing   Problem: RH COGNITION-NURSING Goal: RH STG ANTICIPATES NEEDS/CALLS FOR ASSIST W/ASSIST/CUES Description: STG Anticipates Needs/Calls for Assist With min Assistance and verbal Cues. Outcome: Progressing   Problem: RH PAIN MANAGEMENT Goal: RH STG PAIN MANAGED AT OR BELOW PT'S PAIN GOAL Description: <3 on a 0-10 pain scale Outcome: Progressing   Problem: RH KNOWLEDGE DEFICIT Goal: RH STG INCREASE KNOWLEDGE OF DIABETES Description: Patient and caregiver will be able to demonstrate knowledge of diabetes medications, dietary restrictions, and follow care with the MD post discharge with min assist from staff. Outcome: Progressing Goal: RH STG INCREASE KNOWLEDGE OF HYPERTENSION Description: Patient and caregiver will demonstrate knowledge of HTN medications, dietary restrictions, and follow up care with the MD post discharge with min assist from staff, Outcome: Progressing Goal: RH STG  INCREASE KNOWLEDGE OF DYSPHAGIA/FLUID INTAKE Description: Patient and caregiver will increase knowledge on dysphagia diet, thickened liquids, and increased fluid intake with min assist from staff. Outcome: Progressing Goal: RH STG INCREASE KNOWLEGDE OF HYPERLIPIDEMIA Description: Patient and caregiver will demonstrate knowledge of HLD medications, dietary restrictions, and follow up care with the MD post discharge with min assist from staff. Outcome: Progressing Goal: RH STG INCREASE KNOWLEDGE OF STROKE PROPHYLAXIS Description: Patient and caregiver with demonstrate knowledge of medications used to prevent future strokes with min assist from staff. Outcome: Progressing

## 2019-08-18 NOTE — Progress Notes (Signed)
[place] PHYSICAL MEDICINE & REHABILITATION PROGRESS NOTE  Subjective/Complaints:  Pt pointing at Right hand  Remains aphasic   ROS: limited due to language/cognition  Objective: Vital Signs: Blood pressure (!) 115/103, pulse 84, temperature 97.8 F (36.6 C), resp. rate 18, height 5\' 7"  (1.702 m), weight 57.7 kg, SpO2 97 %. No results found. Recent Labs    08/17/19 0614  WBC 3.7*  HGB 12.0  HCT 36.3  PLT 143*   Recent Labs    08/17/19 0614  NA 139  K 3.8  CL 103  CO2 26  GLUCOSE 100*  BUN 14  CREATININE 0.54  CALCIUM 9.6    Physical Exam: BP (!) 115/103 (BP Location: Left Arm)   Pulse 84   Temp 97.8 F (36.6 C)   Resp 18   Ht 5\' 7"  (1.702 m)   Wt 57.7 kg   SpO2 97%   BMI 19.92 kg/m  Constitutional: No distress . Vital signs reviewed. HENT: Normocephalic.  Atraumatic. Eyes: EOMI. No discharge. Cardiovascular: No JVD. Respiratory: Normal effort.  No stridor. GI: Non-distended. Skin: Warm and dry.  Intact. Psych: Normal mood.  Normal behavior. Musc: No edema in extremities.  No tenderness in extremities. Neuro: Alert Global aphasia Motor: Limited due to ability to follow commands, however spontaneously moving left side, no movement noted on right side, unchanged  Assessment/Plan: 1. Functional deficits secondary to left basal ganglia hypertensive hemorrhage which require 3+ hours per day of interdisciplinary therapy in a comprehensive inpatient rehab setting.  Physiatrist is providing close team supervision and 24 hour management of active medical problems listed below.  Physiatrist and rehab team continue to assess barriers to discharge/monitor patient progress toward functional and medical goals  Care Tool:  Bathing    Body parts bathed by patient: Buttocks, Front perineal area, Abdomen, Chest, Face, Right upper leg, Left upper leg   Body parts bathed by helper: Right lower leg, Left lower leg, Left arm, Right arm     Bathing assist Assist  Level: Moderate Assistance - Patient 50 - 74%     Upper Body Dressing/Undressing Upper body dressing   What is the patient wearing?: Pull over shirt    Upper body assist Assist Level: Moderate Assistance - Patient 50 - 74%    Lower Body Dressing/Undressing Lower body dressing      What is the patient wearing?: Pants, Incontinence brief     Lower body assist Assist for lower body dressing: Maximal Assistance - Patient 25 - 49%     Toileting Toileting    Toileting assist Assist for toileting: Maximal Assistance - Patient 25 - 49%     Transfers Chair/bed transfer  Transfers assist  Chair/bed transfer activity did not occur: Safety/medical concerns  Chair/bed transfer assist level: Maximal Assistance - Patient 25 - 49%     Locomotion Ambulation   Ambulation assist   Ambulation activity did not occur: Safety/medical concerns  Assist level: 2 helpers Assistive device: Walker-hemi Max distance: 20   Walk 10 feet activity   Assist  Walk 10 feet activity did not occur: Safety/medical concerns  Assist level: 2 helpers Assistive device: Walker-hemi   Walk 50 feet activity   Assist Walk 50 feet with 2 turns activity did not occur: Safety/medical concerns         Walk 150 feet activity   Assist Walk 150 feet activity did not occur: Safety/medical concerns         Walk 10 feet on uneven surface  activity  Assist Walk 10 feet on uneven surfaces activity did not occur: Safety/medical concerns         Wheelchair     Assist Will patient use wheelchair at discharge?: Yes Type of Wheelchair: Manual    Wheelchair assist level: Supervision/Verbal cueing Max wheelchair distance: 172ft    Wheelchair 50 feet with 2 turns activity    Assist        Assist Level: Supervision/Verbal cueing   Wheelchair 150 feet activity     Assist     Assist Level: Supervision/Verbal cueing      Medical Problem List and Plan: 1.  Right side  weakness and aphasia secondary to left basal ganglia hypertensive hemorrhage  Continue CIR PT, OT, SLP  2.  Antithrombotics: -DVT/anticoagulation: Subcutaneous heparin initiated 07/14/2019.  Monitor for any bleeding episodes-changed to enoxaparin to reduce freq of injection              -antiplatelet therapy: N/A  3. Pain Management: Tylenol as needed 4. Mood: Provide emotional support  Antipsychotic agents: N/A  Ritalin increased to 10 mg - f/u with therapy to see if this has improved attention during therapy  5. Neuropsych: This patient is not capable of making decisions on her own behalf. 6. Skin/Wound Care: Routine skin checks 7. Fluids/Electrolytes/Nutrition: Routine in and outs.  8.  Hypertension.  Lisinopril 20 mg twice daily, Norvasc 10 mg daily.               Vitals:   08/18/19 0605 08/18/19 0607  BP: 124/71 (!) 115/103  Pulse: 79 84  Resp: 18   Temp: 97.8 F (36.6 C)   SpO2: 97% 97%   occ lability, ? Ritalin contributing  9.  Hypernatremia-resolved 10.  Leukocytosis             Resolved  11.  Transaminasemia-resolved  12.  Aphasia as well as speech apraxia  13.  Thrombocytopenia  Platelets 138 on 1/11, labs ordered for tomorrow 14.  Hypoalbuminemia  Supplement initiated on 1/11 15.  Acute blood loss anemia  Hemoglobin 11.8 on 1/11, repeat is 12.0 on 1/18  LOS: 31 days A FACE TO [place] E Unknown Flannigan 08/18/2019, 8:39 AM

## 2019-08-18 NOTE — Progress Notes (Addendum)
Speech Language Pathology Daily Session Note  Patient Details  Name: [name] MRN: [id number] Date of Birth: 05-18-1966  Today's Date: 08/18/2019 SLP Individual Time: 1000-1103 SLP Individual Time Calculation (min): 63 min  Short Term Goals: Week 4: SLP Short Term Goal 1 (Week 4): STG=LTG due to short ELOS  Skilled Therapeutic Interventions: Skilled ST services focused on education, language and speech skills. Pt's husband, [name] and Clinical SLP were also present for treatment session. Pt ambulated WC to sink, expressing desire to brush teeth with gestures and in response to yes/no questions. Pt demonstrated initial impulsivity, however once redirected by SLP, pt demonstrated ability to follow 1 step commands to complete functional oral care task.   Pt began stereotypical vocalization "ah" and appeared frustrated due to communication breakdown. Pt began crying, [name] and SLPs provided emotional support. Witting was reassess as possible form of communication. Pt was able to copy simple drawing (stick figure and bed) and wrote first and last name independently with non-dominate hand. SLP assessed ability to write given dictation, Pt demonstrated emerging ability to write simple words given initial letter requiring verbal/tactile cues including hand over hand to break motor pattern. Pt demonstrated increase awareness, with ability to correct written preservation error x1.Pt's primary barrier to success at this time is due to perseveratory response patten.   SLP addressed inhabiting perseveratory speech patterns and shifting between two phonetic productions, close and open bilabial postures, with max A verbal/tactile cues fading to less cuing. SLP provided education on current impairment in preservation impacting language and functional abilities, with strategies to assist ceasing perseveration in the moment (holding up hand) and allowing pt to restart task. All questions were answered to  satisfaction. SLP recommends to continue targeting expressive and receptive language skills utilizing drawing and witting program.Pt was left in room with [name], and call bell within reach. ST recommends to continue skilled ST services.      Pain Pain Assessment Pain Scale: Faces Pain Score: 0-No pain Faces Pain Scale: No hurt  Therapy/Group: Individual Therapy  Desirey Keahey  [facility] 08/18/2019, 12:31 PM

## 2019-08-18 NOTE — Progress Notes (Signed)
Occupational Therapy Session Note  Patient Details  Name: Melanie Madden MRN: [id number] Date of Birth: 06-Nov-1965  Today's Date: 08/18/2019 OT Individual Time: 1300-1400 OT Individual Time Calculation (min): 60 min    Short Term Goals: Week 4:  OT Short Term Goal 1 (Week 4): STGs=LTGs due to ELOS  Skilled Therapeutic Interventions/Progress Updates:    Upon entering the room, pt seated in wheelchair with husband present in room for hands on family education. Pt finishing lunch and caregiver providing appropriate feedback to pt based on safety strategies listed for meals. Pt indicated that she was done eating. OT discussed home set up and recommendations for equipment at this time. OT does not recommend shower equipment at this time secondary to safety and instead recommends LB self care from bed level and UB self care seated on EOB or in wheelchair at sink. OT recommended drop arm commode chair and OT showed caregiver the benefit to this equipment. Caregiver practiced setting up wheelchair, removing leg rests, and cuing pt for transfer to toilet. He was able to manage clothing to sit on toilet. However, pt very impulsive and standing multiple times from toilet but caregiver demonstrating good safety awareness but providing good verbal feedback to pt and assisting pt with maintaining balance in standing. Pt returning to wheelchair, and assisted to sink for hand hygiene. Caregiver verbalized he has a stair lift installed with plans to fasten her in and he will walk with hands on her while it lifts her up stairs. Caregiver also planning on installing gate on wall for safety so pt can't roll down stairs. Caregiver was very receptive to information and engaging this session. Plans for next session and OT asking caregiver to come with questions. Pt remained in wheelchair with lap tray donned and chair alarm belt activated. All needs within reach and husband remains within room.   Therapy  Documentation Precautions:  Precautions Precautions: Fall, Other (comment) Precaution Comments: pusher, right hemiparesis Restrictions Weight Bearing Restrictions: No General:   Vital Signs: Therapy Vitals Temp: 98.7 F (37.1 C) Pulse Rate: 91 Resp: 18 BP: 139/86 Patient Position (if appropriate): Sitting Oxygen Therapy SpO2: 100 % O2 Device: Room Air   Therapy/Group: Individual Therapy  Gypsy Decant 08/18/2019, 4:35 PM

## 2019-08-18 NOTE — Progress Notes (Signed)
Had discussion with patients sister this morning, with permission from the husband, regarding patients progress.  She had many medical questions and was asking about what her recovery period would look like.  I informed her that team conference would be held tomorrow (Wednesday) and that many questions could be addressed by speaking with the team during that time.  She would like to speak with the MD directly.  She and the husband are open to a conference call involving them both once conference is finished.  Some of her specific questions were: Will she need Lovenox injections once she is discharged home?  Will she be on any blood pressure reducing meds when she is discharged?  How long will it take for her to regain the ability to communicate effectively again?  What interventions have been put in place to prevent another stroke or bleed?  She also questions whether or not the hypertensive emergency that resulted in the bleed be related to stress and anxiety; and if so, what are we doing to keep her calm?  The sister brought up the patients marriage to [place] and how she thought [name] was making her more agitated.  She also had questions about the process and extent of family education done prior to discharge.  I have updated the SW about the request to have sister a part of care discussions moving forward.  Brita Romp, RN

## 2019-08-19 ENCOUNTER — Inpatient Hospital Stay (HOSPITAL_COMMUNITY): Payer: Managed Care, Other (non HMO) | Admitting: Physical Therapy

## 2019-08-19 ENCOUNTER — Inpatient Hospital Stay (HOSPITAL_COMMUNITY): Payer: Managed Care, Other (non HMO)

## 2019-08-19 ENCOUNTER — Inpatient Hospital Stay (HOSPITAL_COMMUNITY): Payer: Managed Care, Other (non HMO) | Admitting: Occupational Therapy

## 2019-08-19 NOTE — Patient Care Conference (Signed)
Inpatient RehabilitationTeam Conference and Plan of Care Update Date: 08/19/2019   Time: 10:10 AM    Patient Name: Melanie Madden      Medical Record Number: [id number]  Date of Birth: 1965-10-08 Sex: Female         Room/Bed: 4W20C/4W20C-01 Payor Info: Payor: [employer] / Plan: CIGNA MANAGED / Product Type: *No Product type* /    Admit Date/Time:  07/18/2019  2:02 PM  Primary Diagnosis:  Dysphagia  Patient Active Problem List   Diagnosis Date Noted  . Acute blood loss anemia   . Hypoalbuminemia due to protein-calorie malnutrition ([place])   . Thrombocytopenia ([place])   . Dysphagia 07/29/2019  . 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 Hypernatremia   . Leukocytosis   . Essential hypertension   . Global aphasia   . Cytotoxic brain edema ([place]) 07/10/2019  . ICH (intracerebral hemorrhage) ([place]) 07/09/2019  . Anxiety state 02/21/2015  . Cephalalgia 12/21/2014    Expected Discharge Date: Expected Discharge Date: 08/21/19  Team Members Present: Physician leading conference: Dr. Alysia Penna Social Worker Present: Lennart Pall, LCSW Nurse Present: Barnabas Lister, RN;Deborah Hervey Ard, RN Case Manager: Karene Fry, RN PT Present: Barrie Folk, PT;Rosita Dechalus, PTA OT Present: Darleen Crocker, OT SLP Present: Charolett Bumpers, SLP PPS Coordinator present : Gunnar Fusi, SLP     Current Status/Progress Goal Weekly Team Focus  Bowel/Bladder   Mostly incontinent but can be continent  Managed bowel and bladder with regular bowel pattern  Assist with toileting needs, timed toileting   Swallow/Nutrition/ Hydration   dys 2 and thin cup/straw, min A  Min A with least restrictive diet  slow rate and trials dsy 3, education   ADL's   Max A toileting, functional transfer, LB self care, mod A UB self care and bathing  min A grooming and UB self care, mod A bathing from bed level, and max A all other goals  NMR, d/c planning, family education with husband, balance, ADL retraining.    Mobility   modA squat pivot, minA STS with blocking of R knee, supervision w/c mobilty, SBA to minA sitting balance due to intermittent over-compensations and poor righting reactions when leaning to R.  min-modA  hands on family ed, transfers   Communication   Max-Mod A, improved use of writting as a tool for communication and some phoneme variation  Max A expressive and Mod A receptive  education with husband, wants/needs yes/no gestures, writting at copying level towards dication, phoneme imitation   Safety/Cognition/ Behavioral Observations  Mod-Max A improved sustain attention, auditory comprehension and basic problem solving  Mod-Max A  education on use of 1 step directions, redirection for preseveration/attention, and basic problem solving   Pain   Pt has expressive aphasia but gives non-verbal cues for pain  Pain control of 3 or less on pain scale. Tylenol PRN  Assess pain q shift and prn   Skin   Skin tear to right elbow with foam dressing. MASD to buttocks, barrier cream applied  No further skin breakdown or infection  Assess skin q shift and PRN    Rehab Goals Patient on target to meet rehab goals: Yes *See Care Plan and progress notes for long and short-term goals.     Barriers to Discharge  Current Status/Progress Possible Resolutions Date Resolved   Nursing                  PT  OT                  SLP                SW                Discharge Planning/Teaching Needs:  home with husband to provide 24/7 assistance.  Training has begun formally this week - daily   Team Discussion: Verbalizing more, DC Ritalin, ?overstimulated.  RN verbal gestures, inc B/B.  OT max toileting, max transfers/LB self care, min A UB self care, husband involved daily, at goal level.  PT max transfers, impulsive at times, S w/c mobility, husband in for fam ed yesterday.  SLP fam ed with husband, working on communication, cognition improved, perseverates, possible upgrade  to D3 soon.   Revisions to Treatment Plan: N/A     Medical Summary Current Status: Vocalizing much more , some increased impulsivity, remains incont Weekly Focus/Goal: d/c planning, caregiver training  Barriers to Discharge: Medical stability   Possible Resolutions to Barriers: d/c ritalin,at goal level   Continued Need for Acute Rehabilitation Level of Care: The patient requires daily medical management by a physician with specialized training in physical medicine and rehabilitation for the following reasons: Direction of a multidisciplinary physical rehabilitation program to maximize functional independence : Yes Medical management of patient stability for increased activity during participation in an intensive rehabilitation regime.: Yes Analysis of laboratory values and/or radiology reports with any subsequent need for medication adjustment and/or medical intervention. : Yes   I attest that I was present, lead the team conference, and concur with the assessment and plan of the team.   Jodell Cipro M 08/19/2019, 7:20 PM   Team conference was held via web/ teleconference due to [place] - 19

## 2019-08-19 NOTE — Progress Notes (Addendum)
[facility] PHYSICAL MEDICINE & REHABILITATION PROGRESS NOTE  Subjective/Complaints:  Vocalizing loudly , per RN pt wants breakfast  ROS: limited due to language/cognition  Objective: Vital Signs: Blood pressure 133/88, pulse 75, temperature 97.6 F (36.4 C), temperature source Oral, resp. rate 18, height _0  (1.702 m), weight 57.7 kg, SpO2 98 %. No results found. Recent Labs    08/17/19 0614  WBC 3.7*  HGB 12.0  HCT 36.3  PLT 143*   Recent Labs    08/17/19 0614  NA 139  K 3.8  CL 103  CO2 26  GLUCOSE 100*  BUN 14  CREATININE 0.54  CALCIUM 9.6    Physical Exam: BP 133/88 (BP Location: Left Arm)   Pulse 75   Temp 97.6 F (36.4 C) (Oral)   Resp 18   Ht _1  (1.702 m)   Wt 57.7 kg   SpO2 98%   BMI 19.92 kg/m  Constitutional: No distress . Vital signs reviewed. HENT: Normocephalic.  Atraumatic. Eyes: EOMI. No discharge. Cardiovascular: No JVD. Respiratory: Normal effort.  No stridor. GI: Non-distended. Skin: Warm and dry.  Intact. Psych: Normal mood.  Normal behavior. Musc: No edema in extremities.  No tenderness in extremities. Neuro: Alert Global aphasia Motor: Limited due to ability to follow commands, however spontaneously moving left side, no movement noted on right side, unchanged  Assessment/Plan: 1. Functional deficits secondary to left basal ganglia hypertensive hemorrhage which require 3+ hours per day of interdisciplinary therapy in a comprehensive inpatient rehab setting.  Physiatrist is providing close team supervision and 24 hour management of active medical problems listed below.  Physiatrist and rehab team continue to assess barriers to discharge/monitor patient progress toward functional and medical goals  Care Tool:  Bathing    Body parts bathed by patient: Buttocks, Front perineal area, Abdomen, Chest, Face, Right upper leg, Left upper leg   Body parts bathed by helper: Right lower leg, Left lower leg, Left arm, Right arm      Bathing assist Assist Level: Moderate Assistance - Patient 50 - 74%     Upper Body Dressing/Undressing Upper body dressing   What is the patient wearing?: Pull over shirt    Upper body assist Assist Level: Moderate Assistance - Patient 50 - 74%    Lower Body Dressing/Undressing Lower body dressing      What is the patient wearing?: Pants, Incontinence brief     Lower body assist Assist for lower body dressing: Maximal Assistance - Patient 25 - 49%     Toileting Toileting    Toileting assist Assist for toileting: Maximal Assistance - Patient 25 - 49%     Transfers Chair/bed transfer  Transfers assist  Chair/bed transfer activity did not occur: Safety/medical concerns  Chair/bed transfer assist level: Maximal Assistance - Patient 25 - 49%     Locomotion Ambulation   Ambulation assist   Ambulation activity did not occur: Safety/medical concerns  Assist level: 2 helpers Assistive device: Walker-hemi Max distance: 20   Walk 10 feet activity   Assist  Walk 10 feet activity did not occur: Safety/medical concerns  Assist level: 2 helpers Assistive device: Walker-hemi   Walk 50 feet activity   Assist Walk 50 feet with 2 turns activity did not occur: Safety/medical concerns         Walk 150 feet activity   Assist Walk 150 feet activity did not occur: Safety/medical concerns         Walk 10 feet on uneven surface  activity  Assist Walk 10 feet on uneven surfaces activity did not occur: Safety/medical concerns         Wheelchair     Assist Will patient use wheelchair at discharge?: Yes Type of Wheelchair: Manual    Wheelchair assist level: Supervision/Verbal cueing Max wheelchair distance: 119f    Wheelchair 50 feet with 2 turns activity    Assist        Assist Level: Supervision/Verbal cueing   Wheelchair 150 feet activity     Assist     Assist Level: Supervision/Verbal cueing      Medical Problem List  and Plan: 1.  Right side weakness and aphasia secondary to left basal ganglia hypertensive hemorrhage  Continue CIR PT, OT, SLP , Team conference today please see physician documentation under team conference tab, met with team face-to-face to discuss problems,progress, and goals. Formulized individual treatment plan based on medical history, underlying problem and comorbidities. 2.  Antithrombotics: -DVT/anticoagulation: Subcutaneous heparin initiated 07/14/2019.  Monitor for any bleeding episodes-changed to enoxaparin to reduce freq of injection              -antiplatelet therapy: N/A  3. Pain Management: Tylenol as needed 4. Mood: Provide emotional support  Antipsychotic agents: N/A  Ritalin decreased to 5 mg -no difference in 5 an [id number] clinically 5. Neuropsych: This patient is not capable of making decisions on her own behalf. 6. Skin/Wound Care: Routine skin checks 7. Fluids/Electrolytes/Nutrition: Routine in and outs.  8.  Hypertension.  Lisinopril 20 mg twice daily, Norvasc 10 mg daily.               Vitals:   08/18/19 1939 08/19/19 0455  BP: (!) 152/85 133/88  Pulse: 76 75  Resp: 18 18  Temp: 97.7 F (36.5 C) 97.6 F (36.4 C)  SpO2: 100% 98%   occ lability, ? Ritalin contributing - controlled this am - d/c ritalin                         9.  Hypernatremia-resolved 10.  Leukocytosis             Resolved  11.  Transaminasemia-resolved  12.  Aphasia as well as speech apraxia  13.  Thrombocytopenia  Platelets 138 on 1/11,stable 143k 14.  Hypoalbuminemia  Supplement initiated on 1/11 15.  Acute blood loss anemia mild normochromic normocytic   Hemoglobin 11.8 on 1/11, repeat is 12.0 on 1/18 16.  Dysphagia Stroke SLP following  LOS: 32 days A FACE TO FACE EVALUATION WAS PERFORMED  AnCharlett Blake/20/2021, 8:00 AM

## 2019-08-19 NOTE — Progress Notes (Signed)
Occupational Therapy Session Note  Patient Details  Name: Lonnette Bonadio MRN: [id number] Date of Birth: 1965/12/17  Today's Date: 08/19/2019 OT Individual Time: 1300-1402 OT Individual Time Calculation (min): 62 min    Short Term Goals: Week 3:  OT Short Term Goal 1 (Week 3): STGs=LTGs due to ELOS OT Short Term Goal 1 - Progress (Week 3): Progressing toward goal  Skilled Therapeutic Interventions/Progress Updates:    Upon entering the room, pt seated in wheelchair with husband present providing supervision and cuing for lunch. Caregiver is present for continued hands on education in preparation for discharge. Caregiver transfers pt back to bed with max A stand pivot transfer. Supine >sit with pt laying on L side. OT educated caregiver on R shoulder subluxation and safety concerns for self care and mobility. OT demonstrated how to apply kinesiotape for support and pain management. Caregiver returning demonstrations with min cuing. OT demonstrating PROM for R UE and advised pt to not go any further than 90 degrees for shoulder flexion. Caregiver returning demonstrations and showing good support at joints with movement and keeping shoulder safe throughout. OT provided him paper handout on these exercises and subluxation. Pt performing supine >sit with caregiver donning pt's shoes safely and transferring pt back into wheelchair. Chair alarm belt donned and call bell within reach. Pt going to finish lunch as therapist exited the room.   Therapy Documentation Precautions:  Precautions Precautions: Fall, Other (comment) Precaution Comments: pusher, right hemiparesis Restrictions Weight Bearing Restrictions: No General:   Vital Signs: Therapy Vitals Temp: 97.9 F (36.6 C) Pulse Rate: 93 Resp: 16 BP: 127/85 Patient Position (if appropriate): Sitting Oxygen Therapy SpO2: 100 % O2 Device: Room Air   Therapy/Group: Individual Therapy  Gypsy Decant 08/19/2019, 4:09 PM

## 2019-08-19 NOTE — Discharge Summary (Signed)
Physician Discharge Summary  Patient ID: Stevee Mani MRN: [id number] DOB/AGE: [birth year] 54 y.o.  Admit date: 07/18/2019 Discharge date: 08/21/2019  Discharge Diagnoses:  Principal Problem:   Dysphagia Active Problems:   ICH (intracerebral hemorrhage) (HCC)   Infarction of left basal ganglia (HCC)   Hypoalbuminemia due to protein-calorie malnutrition (HCC)   Thrombocytopenia (HCC)   Acute blood loss anemia DVT prophylaxis Hypertension Tobacco abuse Hyperlipidemia  Discharged Condition: Stable  Significant Diagnostic Studies: No results found.  Labs:  Basic Metabolic Panel: Recent Labs  Lab 08/17/19 0614  NA 139  K 3.8  CL 103  CO2 26  GLUCOSE 100*  BUN 14  CREATININE 0.54  CALCIUM 9.6    CBC: Recent Labs  Lab 08/17/19 0614  WBC 3.7*  NEUTROABS 1.7  HGB 12.0  HCT 36.3  MCV 91.7  PLT 143*    CBG: No results for input(s): GLUCAP in the last 168 hours.  Family history.  Mother with Alzheimer's disease, hypertension, diabetes mellitus, hypothyroidism.  Negative for colon cancer, esophageal cancer pancreatic cancer rectal cancer  Brief HPI:   Martavia Reiser is a 54 y.o. right-handed female with history of hypertension, anxiety, migraine headaches and tobacco use.  Per chart review and husband patient lives with spouse independent prior to admission.  Presented 07/09/2019 with right side weakness and aphasia after returning home from work.  Cranial CT scan showed intraparenchymal hematoma with a volume of 40 cc centered in the left basal ganglia most consistent with hypertensive hemorrhage.  5 mm rightward midline shift.  MRA of the head negative.  Echocardiogram with ejection fraction of 65%.  Patient did not receive TPA due to [place].  Admission labs unremarkable SARS coronavirus negative.  Patient initially on 3% saline.  Cleviprex for blood pressure control later discontinued.  Her diet was a dysphagia #2 nectar thick liquid.  Subcutaneous heparin initiated for DVT  prophylaxis 07/14/2019.  Therapy evaluations completed and patient was admitted for a comprehensive rehab program   Hospital Course: Marvie Basom was admitted to rehab 07/18/2019 for inpatient therapies to consist of PT, ST and OT at least three hours five days a week. Past admission physiatrist, therapy team and rehab RN have worked together to provide customized collaborative inpatient rehab.  Pertaining to patient's left basal ganglia hemorrhage secondary to hypertensive crisis remained stable she would follow-up with neurology services.  Subcutaneous heparin initiated for DVT prophylaxis 07/14/2019 and transition to Lovenox.  Blood pressure remained monitored.  Her blood pressures initially were a bit soft and her lisinopril and Norvasc were held and could be resumed back as needed with follow-up per PCP.  Patient would follow-up with her primary MD.  Initial attempts to add Ritalin to help patient regain focus and attention to task causing some occasional lability this was later discontinued.  Lipitor initiated for hyperlipidemia.  Her diet was advanced to a dysphagia #2 thin liquid diet.   Blood pressures were monitored on TID basis and monitored  /She is continent of bowel and bladder.  Marlana Salvage has made gains during rehab stay and is attending therapies  Marlana Salvage will continue to receive follow up therapies   after discharge  Rehab course: During patient's stay in rehab weekly team conferences were held to monitor patient's progress, set goals and discuss barriers to discharge. At admission, patient required +2 physical assist sit to stand, max assist supine to sit.  Total assist upper body and lower body bathing  Physical exam.  Blood pressure 140/77 pulse 110 temperature 98.6 respirations  17 oxygen saturations 100% room air Constitutional.  Well-developed well-nourished Head.  Normocephalic and atraumatic Eyes.  Pupils round and reactive to light no discharge nystagmus Neck.  Supple nontender no  JVD without thyromegaly Respiratory effort normal no stridor no respiratory distress Cardiac regular rate rhythm without any extra sounds or murmur heard Musculoskeletal no edema or tenderness in extremities Neurological.  Alert nonverbal global aphasia no movement to right upper right lower extremity.  Marlana Salvage  has had improvement in activity tolerance, balance, postural control as well as ability to compensate for deficits. Marlana Salvage has had improvement in functional use RUE/LUE  and RLE/LLE as well as improvement in awareness.  Stand pivot to wheelchair with max assist from husband.  Max cues for set up positioning proper blocking technique to prevent knee collapse.  Patient performed pericare on toilet with husband providing minimal assist for balance.  Car transfers with sliding board stand pivot with HW.  Moderate assist for both transfers with therapy and max cues for set up.  Husband perform stand pivot transfers with mod max assist overall.  Sit to stand x2 with min mod assist for PT to stabilize the knee and improve trunk stability.  Supine to sit with patient laying on left side.  Total assist for ADLs.  Speech therapy follow-up for dysphagia as well as dysphagia.  She exhibited no overt signs of aspiration.  Patient oriented to date by pointing to calendar.  Completed basic tasks such as cleaning and wiping her tray clean with minimal assistance.  Full family teaching completed plan discharge to home       Disposition: Discharge to home    Diet: Dysphagia #2 thin liquids  Special Instructions: No smoking or alcohol  Follow-up with PCP in regards to blood pressure control  Follow-up with Dr. Ernie Hew for Depo Provera injection as directed last received 08/18/2019.  Medications at discharge 1.  Tylenol as needed 2.  Senokot S1 tablet p.o. twice daily 3.  Lipitor 20 mg daily   Discharge Instructions    Ambulatory referral to Neurology   Complete by: As directed    An appointment is  requested in approximately 4 weeks left basal ganglia hypertensive hemorrhage   Ambulatory referral to Physical Medicine Rehab   Complete by: As directed    Moderate complexity follow-up 1 to 2 weeks ICH      Follow-up Information    Kirsteins, [name], MD Follow up.   Specialty: Physical Medicine and Rehabilitation Why: Office to call for appointment Contact information: [place] [id number] [phone]           Signed: Cathlyn Parsons 08/21/2019, 5:13 AM

## 2019-08-19 NOTE — Progress Notes (Signed)
Speech Language Pathology Daily Session Note  Patient Details  Name: Melanie Madden MRN: [id number] Date of Birth: [birth year]  Today's Date: 08/19/2019 SLP Individual Time: [id number] SLP Individual Time Calculation (min): 43 min  Short Term Goals: Week 4: SLP Short Term Goal 1 (Week 4): STG=LTG due to short ELOS  Skilled Therapeutic Interventions: Skilled ST services focused on education and language skills. [name] was present for treatment session. SLP facilitated receptive language skills to assess comprehension verse perseveration impairment. Pt demonstrated ability to match word to object in a field of 2 in 3 out 3 trials, and match word to object in a field of 3 in 8 out 11 trials. Pt demonstrated ability to drawing missing part of picture (drawing handle on teapot) in 3 out 4 trials and then in 4 out 4 trials after demonstration for correct placement on picture. Pt demonstrated ability to draw 5 out 5 items from memory of presented picture of common object, draw common object given dictation in 4 out 5 trials with x1 ability to recognize preservation error. When presented with written word of common object and requested to draw it, pt perseverated on witting the word, recognizing error x2 and ability to correct error when presented with picture of object.  Pt demonstrated ability to write CVC words (pig,dog,cow) given dictation, 1st word with 100% accuracy, 2nd word with 1 out 3 letter accuracy and 3rd word with 2 out 3 letter accuracy, with ability to correct perseveration errors given demonstration. Pt demonstrated overall increased receptive language and reduced perseveration compared to yesterday's session. SLP provided education on continued daily progress with receptive language and ways to assist in expressive language with drawing and witting. All questions answered to satisfaction. Pt was left in room with [name], call bell within reach and chair alarm set. ST recommends to continue skilled ST  services.      Pain Pain Assessment Pain Score: 0-No pain  Therapy/Group: Individual Therapy  Samyuktha Brau  [facility] 08/19/2019, 5:06 PM

## 2019-08-19 NOTE — Progress Notes (Signed)
Physical Therapy Session Note  Patient Details  Name: Melanie Madden MRN: [id number] Date of Birth: 02/05/1966  Today's Date: 08/19/2019 PT Individual Time: 0900-0955 PT Individual Time Calculation (min): 55 min   Short Term Goals: Week 4:  PT Short Term Goal 1 (Week 4): Pt will maintain standing balacne with mod assist and LRAD up to 1 minute PT Short Term Goal 2 (Week 4): Pt will propell WC 123ft with min assist PT Short Term Goal 3 (Week 4): Pt will transfer to and from [facility] with mod assist of 1 person consistently  Skilled Therapeutic Interventions/Progress Updates: Pt presented in w/c with MD present for am assessment. PTA donned AFO and socks total A. Pt propelled to rehab gym hallway and performed w/c mobility negotiating between cones at different widths. Pt demonstrated overall fair control and safety weaving through cones and was able to self correct when cued that he was running over a cone. Pt then propelled to ortho gyn and participated in [place] without lap tray in place, but arm rests to block pt. Pt performed Dyn[employer]ision in mode A with pt able to reach and occasionally correct R lean (when blocked by w/c). Pt then attempting to communicate with pt able to write "home" on paper. Explained that she will be going home on Fri with pt becoming emotional and PTA providing emotional support. Pt propelled back to room at end of session and left with lap tray returned in place, call bell within reach, and seat alarm placed.      Therapy Documentation Precautions:  Precautions Precautions: Fall, Other (comment) Precaution Comments: pusher, right hemiparesis Restrictions Weight Bearing Restrictions: No General:   Vital Signs:     Therapy/Group: Individual Therapy  Loucinda Croy 08/19/2019, 10:30 AM

## 2019-08-19 NOTE — Progress Notes (Signed)
Physical Therapy Session Note  Patient Details  Name: Melanie Madden MRN: [id number] Date of Birth: 1965/08/26  Today's Date: 08/19/2019 PT Individual Time: 1115-1210 PT Individual Time Calculation (min): 55 min   Short Term Goals: Week 4:  PT Short Term Goal 1 (Week 4): Pt will maintain standing balacne with mod assist and LRAD up to 1 minute PT Short Term Goal 2 (Week 4): Pt will propell WC 172f with min assist PT Short Term Goal 3 (Week 4): Pt will [facility] from [facility] assist of 1 person consistently  Skilled Therapeutic Interventions/Progress Updates:   Pt received sitting in WC and agreeable to PT. Pt requesting to use bathroom. Stand [facility] to [facility] assist from husband. Max cues for set up, positioning, proper blocking technique to prevent knee collapse and use of rail. Pt perform per care, on toilet with husband providing min assist for balance. PT then instructed pt in stand pivot transfer, to [facility] from [facility] and proper blocking technique.   Car transfer with SB and stand pivot with HW. Mod assist for both transfers with Therapy and max cues for set up, and improved motor plan with SB. Husband performed stand pivot transfer with mod-max assist overall with noted difficulty maintaining terminal knee extension on the RLE. Multimodal cues for improved facilitation technique to improve safety of transfer and for increased use of HW rather then edge of door frame on car simulator.   Sit<>stand x 2 with min-mod assist from PT to stabilize the R knee and improve trunk stability in standing. Pt's husband also performed with noted improvement in facilitation of the R knee and trunk to allow pt to stand up with only mod assist.   PT educated pt's husband on [facility] for safe set up and use while in the house. Patient returned to room and left sitting in [facility] in reach and all needs met.         Therapy Documentation Precautions:   Precautions Precautions: Fall, Other (comment) Precaution Comments: pusher, right hemiparesis Restrictions Weight Bearing Restrictions: No    Pain:   Faces: none  Therapy/Group: Individual Therapy  ALorie Phenix1/20/2021, 12:15 PM

## 2019-08-20 ENCOUNTER — Inpatient Hospital Stay (HOSPITAL_COMMUNITY): Payer: Managed Care, Other (non HMO) | Admitting: Occupational Therapy

## 2019-08-20 ENCOUNTER — Encounter (HOSPITAL_COMMUNITY): Payer: Managed Care, Other (non HMO)

## 2019-08-20 ENCOUNTER — Ambulatory Visit (HOSPITAL_COMMUNITY): Payer: Managed Care, Other (non HMO) | Admitting: Physical Therapy

## 2019-08-20 MED ORDER — ACETAMINOPHEN 325 MG PO TABS: 650.0000 mg | ORAL_TABLET | ORAL | Status: DC | PRN

## 2019-08-20 MED ORDER — ATORVASTATIN CALCIUM 20 MG PO TABS: 20.0000 mg | ORAL_TABLET | Freq: Every day | ORAL | 1 refills | Status: AC

## 2019-08-20 MED ORDER — ATORVASTATIN CALCIUM 10 MG PO TABS
20.0000 mg | ORAL_TABLET | Freq: Every day | ORAL | Status: DC
  Administered 2019-08-20: 18:00:00 20 mg via ORAL
  Filled 2019-08-20: qty 2

## 2019-08-20 NOTE — Progress Notes (Signed)
Social Work  Patient ID: Melanie Madden, female   DOB: 1966/06/23, 54 y.o.   MRN: [id number]   Pt and spouse have completed family education with tx team.  Ready for d/c tomorrow.  Reviewed DME and HH arrangement in place.    Juleon Narang, LCSW

## 2019-08-20 NOTE — Progress Notes (Signed)
[place] PHYSICAL MEDICINE & REHABILITATION PROGRESS NOTE  Subjective/Complaints:  Vocalizing loudly ,pt trying to gesture to communicate, ? 4 cats Nods head yes to d/c tomorrow  ROS: limited due to language/cognition  Objective: Vital Signs: Blood pressure 118/76, pulse 70, temperature 98.7 F (37.1 C), temperature source Oral, resp. rate 18, height 5\' 7"  (1.702 m), weight 57.7 kg, SpO2 99 %. No results found. No results for input(s): WBC, HGB, HCT, PLT in the last 72 hours. No results for input(s): NA, K, CL, CO2, GLUCOSE, BUN, CREATININE, CALCIUM in the last 72 hours.  Physical Exam: BP 118/76 (BP Location: Left Arm)   Pulse 70   Temp 98.7 F (37.1 C) (Oral)   Resp 18   Ht 5\' 7"  (1.702 m)   Wt 57.7 kg   SpO2 99%   BMI 19.92 kg/m  Constitutional: No distress . Vital signs reviewed. HENT: Normocephalic.  Atraumatic. Eyes: EOMI. No discharge. Cardiovascular: No JVD. Respiratory: Normal effort.  No stridor. GI: Non-distended. Skin: Warm and dry.  Intact. Psych: Normal mood.  Normal behavior. Musc: No edema in extremities.  No tenderness in extremities. Neuro: Alert Global aphasia Motor: Limited due to ability to follow commands, however spontaneously moving left side, no movement noted on right side, unchanged  Assessment/Plan: 1. Functional deficits secondary to left basal ganglia hypertensive hemorrhage which require 3+ hours per day of interdisciplinary therapy in a comprehensive inpatient rehab setting.  Physiatrist is providing close team supervision and 24 hour management of active medical problems listed below.  Physiatrist and rehab team continue to assess barriers to discharge/monitor patient progress toward functional and medical goals  Care Tool:  Bathing    Body parts bathed by patient: Buttocks, Front perineal area, Abdomen, Chest, Face, Right upper leg, Left upper leg   Body parts bathed by helper: Right lower leg, Left lower leg, Left arm, Right  arm     Bathing assist Assist Level: Moderate Assistance - Patient 50 - 74%     Upper Body Dressing/Undressing Upper body dressing   What is the patient wearing?: Pull over shirt    Upper body assist Assist Level: Moderate Assistance - Patient 50 - 74%    Lower Body Dressing/Undressing Lower body dressing      What is the patient wearing?: Pants, Incontinence brief     Lower body assist Assist for lower body dressing: Maximal Assistance - Patient 25 - 49%     Toileting Toileting    Toileting assist Assist for toileting: Maximal Assistance - Patient 25 - 49%     Transfers Chair/bed transfer  Transfers assist  Chair/bed transfer activity did not occur: Safety/medical concerns  Chair/bed transfer assist level: Maximal Assistance - Patient 25 - 49%     Locomotion Ambulation   Ambulation assist   Ambulation activity did not occur: Safety/medical concerns  Assist level: 2 helpers Assistive device: Walker-hemi Max distance: 20   Walk 10 feet activity   Assist  Walk 10 feet activity did not occur: Safety/medical concerns  Assist level: 2 helpers Assistive device: Walker-hemi   Walk 50 feet activity   Assist Walk 50 feet with 2 turns activity did not occur: Safety/medical concerns         Walk 150 feet activity   Assist Walk 150 feet activity did not occur: Safety/medical concerns         Walk 10 feet on uneven surface  activity   Assist Walk 10 feet on uneven surfaces activity did not occur: Safety/medical concerns  Wheelchair     Assist Will patient use wheelchair at discharge?: Yes Type of Wheelchair: Manual    Wheelchair assist level: Supervision/Verbal cueing Max wheelchair distance: 167ft    Wheelchair 50 feet with 2 turns activity    Assist        Assist Level: Supervision/Verbal cueing   Wheelchair 150 feet activity     Assist     Assist Level: Supervision/Verbal cueing      Medical  Problem List and Plan: 1.  Right side weakness and aphasia secondary to left basal ganglia hypertensive hemorrhage  Continue CIR PT, OT, SLP , 2.  Antithrombotics: -DVT/anticoagulation: Subcutaneous heparin initiated 07/14/2019.  Monitor for any bleeding episodes-d/c enoxaparin in am              -antiplatelet therapy: N/A  3. Pain Management: Tylenol as needed 4. Mood: Provide emotional support  Antipsychotic agents: N/A  Ritalin d/ced no longer requiring this  5. Neuropsych: This patient is not capable of making decisions on her own behalf. 6. Skin/Wound Care: Routine skin checks 7. Fluids/Electrolytes/Nutrition: Routine in and outs.  8.  Hypertension.  Lisinopril 20 mg twice daily, Norvasc 10 mg daily.               Vitals:   08/19/19 2115 08/20/19 0518  BP: 125/76 118/76  Pulse: 78 70  Resp: 18 18  Temp: 98.5 F (36.9 C) 98.7 F (37.1 C)  SpO2: 97% 99%   Normotensive Off meds PCP f/u                    9.  Hypernatremia-resolved 10.  Leukocytosis             Resolved  11.  Transaminasemia-resolved  12.  Aphasia as well as speech apraxia  13.  Thrombocytopenia  Platelets 138 on 1/11,stable 143k 14.  Hypoalbuminemia  Supplement initiated on 1/11 15.  Acute blood loss anemia mild normochromic normocytic   Hemoglobin 11.8 on 1/11, repeat is 12.0 on 1/18 16.  Dysphagia Stroke SLP following  LOS: 33 days A FACE TO FACE EVALUATION WAS PERFORMED  Charlett Blake 08/20/2019, 8:37 AM

## 2019-08-20 NOTE — Progress Notes (Signed)
Occupational Therapy Discharge Summary  Patient Details  Name: Melanie Madden MRN: [id number] Date of Birth: 1966/06/29  Today's Date: 08/20/2019 OT Individual Time: 1300-1400 OT Individual Time Calculation (min): 60 min    Patient has met 11 of 11 long term goals due to improved activity tolerance, improved balance, postural control, ability to compensate for deficits and improved cognition.  Patient to discharge at overall min - max A level.  Patient's care partner is independent to provide the necessary physical and cognitive assistance at discharge.  Melanie Madden has attended multiple OT sessions for hands on caregiver training.  Reasons goals not met: all goals met  Recommendation:  Patient will benefit from ongoing skilled OT services in home health setting to continue to advance functional skills in the area of BADL and Reduce care partner burden.  Equipment: drop arm commode chair  Reasons for discharge: treatment goals met  Patient/family agrees with progress made and goals achieved: Yes   OT Intervention: Upon entering the room, pt seated in wheelchair with husband present for hands on family education. Caregiver understanding OT does not recommend pt shower at home secondary to safety concerns and instead task should be completed at bed level for safety. OT utilized steady for time management and pt standing into steady and transferring onto commode chair with mod A overall. Pt able to void this session in commode and has dry incontinence brief!. OT doffed pt's clothing while she is seated on commode. Pt transferred with stedy into roll in shower chair for safety. Pt participated in washing all the parts she could reach safely with min cuing for sequencing and initiation. Pt remained seated on roll in shower chair and did not push or make sudden anterior weight shifts making this shower much more safety. Pt did not want spouse in the room with her during shower and made gestures everytime  he attempted to enter the room. OT has witnessed caregiver assist pt with self care tasks prior to this session and pt did not get agitated over assist. Pt's LB clothing donned with sit <>stand into stedy from roll in shower chair. Pt assisted into wheelchair and donned pull over shirt with hemiplegic technique and assist to thread over R UE. Pt combing hair at sink and OT assisted with thoroughness and partially drying. Pt did become emotional during this session most likely from her increased awareness of deficts at this time. Pt remained in wheelchair with husband present. Chair alarm belt donned and all needs within reach.   OT Discharge Precautions/Restrictions  Precautions Precautions: Fall Precaution Comments: pusher, right hemiparesis   Pain Pain Assessment Pain Scale: Faces Pain Score: 0-No pain Faces Pain Scale: No hurt   Cognition Overall Cognitive Status: Impaired/Different from baseline Arousal/Alertness: Awake/alert Orientation Level: Oriented to person Attention: Sustained;Focused Focused Attention: Appears intact Sustained Attention: Impaired Sustained Attention Impairment: Verbal basic;Functional basic Awareness: Impaired Awareness Impairment: Emergent impairment;Intellectual impairment Problem Solving: Impaired Problem Solving Impairment: Verbal basic;Functional basic Behaviors: Impulsive Safety/Judgment: Impaired Sensation Sensation Light Touch: Impaired Detail Light Touch Impaired Details: Impaired RUE;Impaired RLE Additional Comments: pt nodding yes when ask if she is able to detect stimuli, but inconsistent when no looking at extremities Coordination Gross Motor Movements are Fluid and Coordinated: No Fine Motor Movements are Fluid and Coordinated: No Coordination and Movement Description: Dense R hemiplegia Finger Nose Finger Test: flaccid RUE Heel Shin Test: flaccid RLE Motor  Motor Motor: Hemiplegia;Abnormal postural alignment and control;Abnormal  tone;Motor apraxia Motor - Discharge Observations: Dense R hemiparesis, hypotonal in  UE and LE Mobility  Bed Mobility Bed Mobility: Rolling Right;Supine to Sit;Sit to Supine;Rolling Left Rolling Right: Minimal Assistance - Patient > 75% Rolling Left: Contact Guard/Touching assist Supine to Sit: Contact Guard/Touching assist Sit to Supine: Minimal Assistance - Patient > 75% Transfers Sit to Stand: Minimal Assistance - Patient > 75%;Moderate Assistance - Patient 50-74%  Trunk/Postural Assessment  Cervical Assessment Cervical Assessment: Within Functional Limits Thoracic Assessment Thoracic Assessment: Exceptions to WFL(rounded shoulders) Lumbar Assessment Lumbar Assessment: Exceptions to WFL(posterior pelvic tilt) Postural Control Postural Control: Deficits on evaluation Trunk Control: greatly improve from eval in sitting, requires min- mod assist in standing to maintian neutral posture Righting Reactions: greatly improved from eval. still limited on the R slightly  Balance Balance Balance Assessed: Yes Static Sitting Balance Static Sitting - Balance Support: No upper extremity supported Static Sitting - Level of Assistance: 5: Stand by assistance Dynamic Sitting Balance Dynamic Sitting - Balance Support: Left upper extremity supported Dynamic Sitting - Level of Assistance: 4: Min assist;5: Stand by assistance Static Standing Balance Static Standing - Balance Support: Left upper extremity supported Static Standing - Level of Assistance: 4: Min assist Dynamic Standing Balance Dynamic Standing - Balance Support: Left upper extremity supported Dynamic Standing - Level of Assistance: 3: Mod assist Extremity/Trunk Assessment RUE Assessment RUE Assessment: Exceptions to [facility] Active Range of Motion (AROM) Comments: 0/5 flaccid Brunstrum level for arm: Stage I Presynergy RUE AROM (degrees) Overall AROM Right Upper Extremity: Deficits LUE Assessment LUE Assessment: Within Functional  Limits   Gypsy Decant 08/20/2019, 2:55 PM

## 2019-08-20 NOTE — Progress Notes (Signed)
Speech Language Pathology Discharge Summary  Patient Details  Name: Melanie Madden MRN: [id number] Date of Birth: [birth year]  Today's Date: 08/20/2019 SLP Individual Time: 1003-1100 SLP Individual Time Calculation (min): 57 min   Skilled Therapeutic Interventions:  Skilled ST services focused on education and language skills. Pt communicated via gestures and in response to yes/no questions, request to use bathroom and brush teeth. SLP, NT and husband [name]) assisted with transfer via stedy, pt required mod A verbal cues to reduce impulsivity. SLP facilitated communication via witting and drawing, pt demonstrated ability to copy draw (ex: house) however unable to draw given just dictation due to perseveration. Pt demonstrated ability to write 3 letter CVC words given dictation in 2 out 3 trials with 67% accuracy and in 3 out 3 trials with written cues to correct perseveration errors. Pt demonstrated ability to express wants/need via writing, "food" and then demonstrated understood gesture for water. Pt was able to clarify wants/needs by selecting written choices, indicating water. Husband provided water. SLP facilitated vocalizations, pt demonstrated ability to alternate /m/ and /ah/ sounds with max A verbal/tatcile cues fading to mod A verbal cues. Pt demonstrated ability to produce CV /ma/ with max A verbal/tatcile cues x7 and x2 with verbal cues only. SLP provided education on utilization of witting to express wants/needs and clarify communication message. All questions answered to satisfaction. Pt was left in room with call bell within reach and chair alarm set. ST recommends to continue skilled ST services.     Patient has met 6 of 6 long term goals.  Patient to discharge at overall Min;Mod;Max level.  Reasons goals not met:     Clinical Impression/Discharge Summary:   Pt met 6 out 6 goals, discharging at min A for swallowing, and max-mod A for communication/cognition. Pt demonstrated great  improvement in sustained attention, basic problem solving and reduced impulsivity during inpatient stay. Pt has been upgraded to dys 3 textures and thin liquid diet, with increase ability for oral clearance, largely resulting form increase attention. Pt is able to comprehend most basic commands, match objects to words, select requested object from a field of 3, copy drawings/simple words, and emerging witting of CVC words given dictation. Pt is able to express wants/needs via gestures, in response to yes/no questions and emerging written language at word level with cues. Pt is able to vocalize stereotypical utterance "ah", alternate /m/ and /ah/ speech sounds and imitate CV /ma/ with verbal/tatcile cues. Pt has made great progress in expressive and receptive language skills and recommending follow up ST services continue to address vocalization and witting as desired communication method. Education was provided to pt and pt's husband pertaining to swallow strategies (handout), ways to increase effective communication and strategies for attention/problem solving. Pt benefited from skilled ST services in order to maximize functional independence and reduce burden of care, requiring 24 hour supervision and continue ST services [facility] then outpatient.  Care Partner:  Caregiver Able to Provide Assistance: Yes  Type of Caregiver Assistance: Physical;Cognitive  Recommendation:  Home Health SLP;24 hour supervision/assistance;Outpatient SLP  Rationale for SLP Follow Up: Maximize functional communication;Maximize cognitive function and independence;Reduce caregiver burden;Maximize swallowing safety   Equipment: N/A   Reasons for discharge: Discharged from hospital   Patient/Family Agrees with Progress Made and Goals Achieved: Yes(husband)    [name] 08/20/2019, 1:21 PM

## 2019-08-20 NOTE — Progress Notes (Signed)
Physical Therapy Discharge Summary  Patient Details  Name: Melanie Madden MRN: [id number] Date of Birth: 1966/07/02  Today's Date: 08/20/2019 PT Individual Time: 1100-1205 PT Individual Time Calculation (min): 65 min    Patient has met 7 of 8 long term goals due to improved activity tolerance, improved balance, improved postural control, increased strength, increased range of motion, ability to compensate for deficits, improved attention and improved awareness.  Patient to discharge at a wheelchair level [place].   Patient's care partner is independent to provide the necessary physical assistance at discharge.  Reasons goals not met: Pt requires + 2 assist with WC follow for gait due to impulsivity and continued R sided dense Hemiplegia.   Recommendation:  Patient will benefit from ongoing skilled PT services in home health setting to continue to advance safe functional mobility, address ongoing impairments in safety, strength, coordination, transfers, gait, balance, bed mobility, neuromotor re-education and minimize fall risk.  Equipment: WC. SB. HW, half lap tray, RAFO and Givmohr sling.   Reasons for discharge: treatment goals met and discharge from hospital  Patient/family agrees with progress made and goals achieved: Yes   PT treatment:  Pt received sitting in WC and agreeable to PT. PT instructed patient in PT Evaluation and initiated treatment intervention; see below for results. PT educated patient in [place], rehab potential, rehab goals, and discharge recommendations. Pt performed stand pivot transfer to and from mat table x 4 with PT and x 4 with husband. Min assist from PT and mod assist from husband due to poor positioning and facilitation of trunk, cues from PT with mild improvements on increased repetitions. Gait training with HW, mod-max assist from PT x 16f with + 2 for WC follow from husband. Car transfer with min assist from PT to stabilize the RLE and cues for use of HW, not  edge of car simulator. Min assist from husband into car and [facility] Moderate multimodal cues for proper set up, positioning, and use of AD to prevent LOB. WC mobility as listed below.      PT Discharge Precautions/Restrictions Restrictions Weight Bearing Restrictions: No Pain Pain Assessment Pain Scale: Faces Faces Pain Scale: No hurt Vision/Perception     Mild R inattention with WC mobility  Cognition Orientation Level: Oriented to person Sensation Sensation Light Touch: Impaired Detail Light Touch Impaired Details: Impaired RUE;Impaired RLE Additional Comments: pt nodding yes when ask if she is able to detect stimuli, but inconsistent when no looking at extremities Coordination Gross Motor Movements are Fluid and Coordinated: No Fine Motor Movements are Fluid and Coordinated: No Coordination and Movement Description: Dense R hemiplegia Finger Nose Finger Test: flaccid RUE Heel Shin Test: flaccid RLE Motor  Motor Motor: Hemiplegia;Abnormal postural alignment and control;Abnormal tone;Motor apraxia Motor - Discharge Observations: Dense R hemiparesis, hypotonal in UE and LE  Mobility Bed Mobility Bed Mobility: Rolling Right;Supine to Sit;Sit to Supine;Rolling Left Rolling Right: Minimal Assistance - Patient > 75% Rolling Left: Contact Guard/Touching assist Supine to Sit: Contact Guard/Touching assist for control of the RLE and improved awareness of RUE once in sitting.  Sit to Supine: Minimal Assistance - Patient > 75% Transfers Transfers: Stand Pivot Transfers Sit to Stand: Minimal Assistance - Patient > 75%;Moderate Assistance - Patient 50-74%(with UE supported on HW and R knee blocked in extension) Stand Pivot Transfers: Moderate Assistance - Patient 50 - 74%;Minimal Assistance - Patient > 75%(with UE supported on HW) Lateral/Scoot Transfers: Minimal Assistance - Patient >  75% Locomotion  Gait Ambulation: Yes Gait Assistance: 2  Helpers Gait Distance (Feet): 30 Feet Assistive device: Hemi-walker Gait Assistance Details: Verbal cues for gait pattern;Verbal cues for safe use of DME/AE;Manual facilitation for weight shifting;Manual facilitation for placement;Manual facilitation for weight bearing;Verbal cues for technique;Verbal cues for precautions/safety;Tactile cues for posture;Tactile cues for weight beaing;Visual cues for safe use of DME/AE;Verbal cues for sequencing;Visual cues/gestures for sequencing Gait Gait: Yes Gait Pattern: Step-to pattern;Right foot flat;Right steppage;Right flexed knee in stance Gait velocity: decreased from near flaccid RLE Stairs / Additional Locomotion Stairs: No Wheelchair Mobility Wheelchair Mobility: Yes Wheelchair Assistance: Supervision/Verbal cueing Wheelchair Propulsion: Left upper extremity;Left lower extremity Wheelchair Parts Management: Needs assistance Distance: 150  Trunk/Postural Assessment  Cervical Assessment Cervical Assessment: Within Functional Limits Thoracic Assessment Thoracic Assessment: Exceptions to WFL(rounded shoulders. poor trunkal rotation r) Lumbar Assessment Lumbar Assessment: Exceptions to WFL(posterior pelvic tilt in sitting) Postural Control Postural Control: Deficits on evaluation Trunk Control: greatly improve from eval in sitting, requires min- mod assist in standing to maintian neutral posture Righting Reactions: greatly improved from eval. still limited on the R slightly  Balance Balance Balance Assessed: Yes Static Sitting Balance Static Sitting - Balance Support: No upper extremity supported Static Sitting - Level of Assistance: 5: Stand by assistance Dynamic Sitting Balance Dynamic Sitting - Balance Support: Left upper extremity supported Dynamic Sitting - Level of Assistance: 4: Min assist;5: Stand by assistance Static Standing Balance Static Standing - Balance Support: Left upper extremity supported Static Standing - Level of  Assistance: 4: Min assist Dynamic Standing Balance Dynamic Standing - Balance Support: Left upper extremity supported Dynamic Standing - Level of Assistance: 3: Mod assist Extremity Assessment      RLE Assessment Passive Range of Motion (PROM) Comments: approx 5 degrees DF, not measured RLE Strength Right Hip Flexion: 0/5 Right Hip Extension: 1/5(inconsistent hip and knee activation in stance.) Right Knee Flexion: 0/5 Right Knee Extension: 1/5 Right Ankle Dorsiflexion: 0/5 Right Ankle Plantar Flexion: 0/5 LLE Assessment LLE Assessment: Within Functional Limits General Strength Comments: 5/5 proximal to distal    Lorie Phenix 08/20/2019, 12:30 PM

## 2019-08-20 NOTE — Discharge Instructions (Signed)
Inpatient Rehab Discharge Instructions  Melanie Madden Discharge date and time: No discharge date for patient encounter.   Activities/Precautions/ Functional Status: Activity: activity as tolerated Diet:  Wound Care: none needed Functional status:  ___ No restrictions     ___ Walk up steps independently ___ 24/7 supervision/assistance   ___ Walk up steps with assistance ___ Intermittent supervision/assistance  ___ Bathe/dress independently ___ Walk with walker     _x__ Bathe/dress with assistance ___ Walk Independently    ___ Shower independently ___ Walk with assistance    ___ Shower with assistance ___ No alcohol     ___ Return to work/school ________    COMMUNITY REFERRALS UPON DISCHARGE:    Home Health:   PT     OT     ST                     Agency:  [facility] Phone: [phone]    Medical Equipment/Items Ordered:  Wheelchair, cushion, lap tray, walker, 3n1 commode and transfer board                                                      Agency/Supplier: [id number]      Special Instructions: No driving smoking or alcohol  Follow-up with PCP in regards to blood pressure control   My questions have been answered and I understand these instructions. I will adhere to these goals and the provided educational materials after my discharge from the hospital.  Patient/Caregiver Signature _______________________________ Date __________  Clinician Signature _______________________________________ Date __________  Please bring this form and your medication list with you to all your follow-up doctor's appointments.

## 2019-08-21 NOTE — Progress Notes (Signed)
Pt was d/c from unit with all personal belongings. Pt left with husband and all questions were answered. Pt agrees to keep all follow up appts. Doy Hutching, LPN

## 2019-08-24 ENCOUNTER — Telehealth: Payer: Self-pay

## 2019-08-24 NOTE — Progress Notes (Signed)
Social Work Discharge Note   The overall goal for the admission was met for:   Discharge location: Yes - home with spouse who can provide 24/7 assistance  Length of Stay: Yes - 34 days  Discharge activity level: Yes - min assist w/c level  Home/community participation: Yes  Services provided included: MD, RD, PT, OT, SLP, RN, TR, Pharmacy and [place]: Private Insurance: [employer]  Follow-up services arranged: Outpatient: PT, OT, ST via [facility], DME: 16x16 lightweight/ hemi w/c with 1/2 lap tray, cushion, hemi walker, 3n1 commode and 30" transfer board via [facility] and Patient/Family has no preference for HH/DME agencies  Comments (or additional information):    Contact - spouse, [name] @ [phone]  Patient/Family verbalized understanding of follow-up arrangements: Yes  Individual responsible for coordination of the follow-up plan: spouse  Confirmed correct DME delivered: Lennart Pall 08/24/2019    Shelva Hetzer

## 2019-08-24 NOTE — Telephone Encounter (Signed)
Left detailed message about patients appt and to call the office back.

## 2019-08-25 ENCOUNTER — Other Ambulatory Visit: Payer: Self-pay

## 2019-08-25 ENCOUNTER — Ambulatory Visit: Payer: Managed Care, Other (non HMO) | Attending: Physician Assistant

## 2019-08-25 DIAGNOSIS — R4701 Aphasia: Secondary | ICD-10-CM | POA: Diagnosis present

## 2019-08-25 DIAGNOSIS — R41841 Cognitive communication deficit: Secondary | ICD-10-CM | POA: Diagnosis present

## 2019-08-25 DIAGNOSIS — M6281 Muscle weakness (generalized): Secondary | ICD-10-CM | POA: Insufficient documentation

## 2019-08-25 DIAGNOSIS — R482 Apraxia: Secondary | ICD-10-CM | POA: Insufficient documentation

## 2019-08-25 DIAGNOSIS — R2689 Other abnormalities of gait and mobility: Secondary | ICD-10-CM | POA: Diagnosis present

## 2019-08-25 DIAGNOSIS — I69153 Hemiplegia and hemiparesis following nontraumatic intracerebral hemorrhage affecting right non-dominant side: Secondary | ICD-10-CM

## 2019-08-25 DIAGNOSIS — R1310 Dysphagia, unspecified: Secondary | ICD-10-CM | POA: Insufficient documentation

## 2019-08-25 NOTE — Telephone Encounter (Signed)
Transitional Care call--husband [facility]    1. Are you/is patient experiencing any problems since coming home? No Are there any questions regarding any aspect of care? No 2. Are there any questions regarding medications administration/dosing? No Are meds being taken as prescribed?YesPatient should review meds with caller to confirm 3. Have there been any falls? NO 4. Has Home Health been to the house and/or have they contacted you?Outpatient PT, OT If not, have you tried to contact them? Can we help you contact them? 5. Are bowels and bladder emptying properly? Yes Are there any unexpected incontinence issues? No If applicable, is patient following bowel/bladder programs? 6. Any fevers, problems with breathing, unexpected pain? No 7. Are there any skin problems or new areas of breakdown? No 8. Has the patient/family member arranged specialty MD follow up (ie cardiology/neurology/renal/surgical/etc)?  None to be scheduled only Dr. Letta Pate Can we help arrange? 9. Does the patient need any other services or support that we can help arrange? No 10. Are caregivers following through as expected in assisting the patient? Yes 11. Has the patient quit smoking, drinking alcohol, or using drugs as recommended? Yes  Appointment time 11:30 am, arrive time 11:15 am with Dr. Letta Pate on 08/27/2019  [address]

## 2019-08-25 NOTE — Telephone Encounter (Signed)
error 

## 2019-08-25 NOTE — Telephone Encounter (Signed)
Called patient numbers and husband [name] numbers Patient [phone] -number changed and new number unknown [phone]- left detailed message [name]-husband [phone]- number can't be completed as dialed [phone]- no answer/no voicemail

## 2019-08-26 ENCOUNTER — Ambulatory Visit: Payer: Managed Care, Other (non HMO)

## 2019-08-26 ENCOUNTER — Other Ambulatory Visit: Payer: Self-pay

## 2019-08-26 DIAGNOSIS — R4701 Aphasia: Secondary | ICD-10-CM

## 2019-08-26 DIAGNOSIS — M6281 Muscle weakness (generalized): Secondary | ICD-10-CM | POA: Diagnosis not present

## 2019-08-26 DIAGNOSIS — R1310 Dysphagia, unspecified: Secondary | ICD-10-CM

## 2019-08-26 DIAGNOSIS — R41841 Cognitive communication deficit: Secondary | ICD-10-CM

## 2019-08-26 DIAGNOSIS — R482 Apraxia: Secondary | ICD-10-CM

## 2019-08-26 NOTE — Therapy (Signed)
[facility] [address] [place], [place], [address] Phone: [phone]   Fax:  [phone]  Physical Therapy Evaluation  Patient Details  Name: Melanie Madden MRN: [id number] Date of Birth: 1966/06/20 Referring Provider (PT): Lauraine Rinne, [place]   Encounter Date: 08/25/2019  PT End of Session - 08/25/19 1455    Visit Number  1    Number of Visits  20    Date for PT Re-Evaluation  11/24/19    Authorization Type  [employer]- 60 VL combined PT/OT/ST    Authorization - Visit Number  20    PT Start Time  1400    PT Stop Time  1455    PT Time Calculation (min)  55 min    Equipment Utilized During Treatment  Gait belt    Activity Tolerance  Patient tolerated treatment well       Past Medical History:  Diagnosis Date  . Allergy   . Anxiety   . Arthritis   . High triglycerides   . Kidney stones   . Migraines   . Recurrent sinus infections     Past Surgical History:  Procedure Laterality Date  . ANTERIOR CRUCIATE LIGAMENT REPAIR Left   . BUNIONECTOMY Right   . LASIK    . LITHOTRIPSY    . MENISCUS REPAIR Left   . NASAL SEPTUM SURGERY    . SHOULDER SURGERY    . VARICOSE VEIN SURGERY Left     There were no vitals filed for this visit.   Subjective Assessment - 08/25/19 1407    Subjective  Pt referred after left basal ganglia ICH. Pt's husband present. Reports that she had just returned home from work and was fine and then he noticed her starting to have some weakness and speech issues. Pt was hospitalized 12/10-12/19/20 then went to inpatient rehab until 08/21/19.    Pertinent History  anxiety, migraines, HTN    Patient Stated Goals  Per husband report they would like her to get stronger on right, improve mobility, and work on speech.    Currently in Pain?  No/denies   but difficult to assess as nonverbal        [facility] PT Assessment - 08/25/19 1411      Assessment   Medical Diagnosis  left basal ganglia ICH     Referring Provider (PT)  Lauraine Rinne, PA    Onset Date/Surgical Date  07/09/19    Hand Dominance  Left    Prior Therapy  inpatient rehab 07/18/19-08/21/19      Precautions   Precautions  Fall      Balance Screen   Has the patient fallen in the past 6 months  Yes    How many times?  1      [place] residence    Living Arrangements  Spouse/significant other    Available Help at Discharge  Family    Type of [place] entrance    [place]  Two level    Alternate Level Stairs-Number of Steps  15   husband installed stair lift   Home Equipment  Wheelchair - manual;Bedside commode;Tub bench;Grab bars - toilet;Grab bars - tub/shower;Hand held shower head;Walker - standard   stair lift, right anterior AFO   Additional Comments  Husband on vacation for 3 weeks and then trying to get some people to come in.       Prior Function  Level of Independence  Independent    Vocation  Part time employment   close to full time hours   Presenter, broadcasting in past, working at [employer], walk    Leisure  go to outdoor fair      Cognition   Overall Cognitive Status  Difficult to assess   Pt was nonverbal. Pt seemed to follow single step commands.    Difficult to assess due to  --   aphasia versus apraxia   Executive Function  --   impulsive     Observation/Other Assessments   Observations  per discharge summary dysphagia 2 diet and thin liquids.     Skin Integrity  intact, right LE cold to touch  but pedal pulse intact with good color in foot      Sensation   Light Touch  Impaired by gross assessment    Additional Comments  Difficult to formally assess as patient nonverbal and unsure if answering yes/no questions correctly with head nods but appeared not to have light touch through right UE and LEs      ROM / Strength   AROM / PROM / Strength  Strength      Strength   Overall Strength Comments  PT  did not note any active movement throughout right UE and legs. Flaccid throughout    Strength Assessment Site  Shoulder;Elbow;Hand;Hip;Knee;Ankle    Right/Left Shoulder  Left;Right    Right Shoulder Flexion  0/5    Left Shoulder Flexion  4/5    Right/Left Elbow  Right;Left    Right Elbow Flexion  0/5    Right Elbow Extension  0/5    Left Elbow Flexion  4/5    Left Elbow Extension  4/5    Right/Left hand  Right;Left    Right Hand Gross Grasp  Impaired   flaccid   Left Hand Gross Grasp  Functional    Right/Left Hip  Right;Left    Right Hip Flexion  0/5    Left Hip Flexion  4/5    Right/Left Knee  Right;Left    Right Knee Extension  0/5    Left Knee Extension  4/5    Right/Left Ankle  Right;Left    Right Ankle Dorsiflexion  0/5      Bed Mobility   Bed Mobility  Rolling Right;Supine to Sit;Sit to Supine    Rolling Right  Contact Guard/Touching assist    Supine to Sit  Moderate Assistance - Patient 50-74%    Sit to Supine  Moderate Assistance - Patient 50-74%   at legs     Transfers   Transfers  Stand Pivot Transfers;Sit to Stand;Stand to Sit    Sit to Stand  2: Max assist;3: Mod assist    Sit to Stand Details  Verbal cues for technique;Visual cues/gestures for precautions/safety   PT blocking right knee, AFO donned   Stand Pivot Transfers  2: Max assist    Stand Pivot Transfer Details (indicate cue type and reason)  Pt transferred w/c to mat to left with husband max assist with difficulty pivoting to turn. Husband was able to block knee correctly. PT transferred mat to w/c to right with having patient scoot close to chair and positioned feet to prevent twisting ankle while blocking knee   right AFO donned   Comments  Pt stood at hemiwalker with PT blocking knee about 20 sec max assist.      Balance   Balance Assessed  Yes  High Level Balance   High Level Balance Comments  Pt able to maintain static sitting balance but when added in dynamic tasks needed CGA for safety. Max  assist to maintain standing.                Objective measurements completed on examination: See above findings.              PT Education - 08/26/19 0818    Education Details  Pt and husband instructed on PT plan of care. Reviewed transfer safety with husband including make sure patient is next to chair and feet are positioned to not get twisted. Needs continued blocking at right knee.    Person(s) Educated  Patient;Spouse    Methods  Explanation;Demonstration    Comprehension  Verbalized understanding       PT Short Term Goals - 08/26/19 0837      PT SHORT TERM GOAL #1   Title  Pt will be able to perform initial HEP with assist of husband for improved function.    Time  4    Period  Weeks    Status  New    Target Date  09/25/19      PT SHORT TERM GOAL #2   Title  Pt will transfer w/c to/from mat with slideboard CGA with assist to set up slideboard for improved mobility.    Time  4    Period  Weeks    Status  New    Target Date  09/25/19      PT SHORT TERM GOAL #3   Title  Pt will be able to stand x 2 min mod assist with PT blocking right knee for improved standing balance.    Time  4    Period  Weeks    Status  New    Target Date  09/25/19      PT SHORT TERM GOAL #4   Title  Pt will perform stand/pivot transfer mod assist of husband to allow improved safety with transfers at home.    Time  4    Period  Weeks    Status  New    Target Date  09/25/19      PT SHORT TERM GOAL #5   Title  Pt will perform all bed mobility min assist of less for improved mobility.    Time  4    Period  Weeks    Status  New    Target Date  09/25/19      Additional Short Term Goals   Additional Short Term Goals  Yes      PT SHORT TERM GOAL #6   Title  Pt will propel w/c  100' supervision for improved mobility in home.    Time  4    Period  Weeks    Status  New    Target Date  09/25/19        PT Long Term Goals - 08/26/19 0841      PT LONG TERM GOAL #1    Title  Pt will be able to perform progressive HEP for strengthening, balance and mobility with assist of husband to continued gains at home.    Time  10    Period  Weeks    Status  New    Target Date  11/04/19      PT LONG TERM GOAL #2   Title  Pt will be able to perform all transfers min assist for improved safety with mobility.  Time  10    Period  Weeks    Status  New    Target Date  11/04/19      PT LONG TERM GOAL #3   Title  Pt will be able to ambulate 10' with max assist for improved access to bathroom at home with husband with appropriate AD.    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 Pt with flaccid right hemiparesis throughout UE and lower extremity. No active movement noted at eval. Pt is nonverbal and PT unsure if aphasia versus apraxia. Will follow-up on speech after their evaluation. Pt was able to follow single step commands but inconsistent. PT noted impulsivity. Pt currently max assist stand pivot transfer needing right knee blocked with AFO donned. Mod assist sit to/from supine and CGA with rolling right. Pt able to stand briefly with PT blocking right knee max assist. Pt is nonambulatory at this time.  Pt will benefit from skilled PT to address strength, balance and functional mobility deficits.    Personal Factors and Comorbidities  Comorbidity 3+    Comorbidities  anxiety, migraines, HTN    Examination-Activity Limitations  Bathing;Bed Mobility;Stand;Locomotion Level;Squat;Stairs;Dressing;Transfers    Examination-Participation Restrictions  Community Activity;Driving;Shop;Laundry;Meal Prep    Stability/Clinical Decision Making  Evolving/Moderate complexity    Clinical Decision Making  Moderate    Rehab Potential  Fair    PT Frequency  2x / week    PT Duration  --   10 weeks   PT Treatment/Interventions   ADLs/Self Care Home Management;Electrical Stimulation;DME Instruction;Gait training;Stair training;Functional mobility training;Therapeutic activities;Therapeutic exercise;Orthotic Fit/Training;Patient/family education;Neuromuscular re-education;Balance training;Manual techniques;Passive range of motion;Vestibular;Wheelchair mobility training    PT Next Visit Plan  Assess w/c mobility and management, transfer training, bed mobility, standing.    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 Acute blood loss anemia   . Hypoalbuminemia due to protein-calorie malnutrition ([place])   . Thrombocytopenia ([place])   . Dysphagia 07/29/2019  . Infarction of left basal ganglia ([place]) 07/18/2019  . Hypernatremia   . Leukocytosis   . Essential hypertension   . Global aphasia   . Cytotoxic brain edema ([place]) 07/10/2019  . ICH (intracerebral hemorrhage) ([place]) 07/09/2019  . Anxiety state 02/21/2015  . Cephalalgia 12/21/2014    Electa Sniff, PT, DPT, NCS 08/26/2019, 8:50 AM  [facility] [address] [place], [place], [address] Phone: [phone]   Fax:  [phone]  Name: Melanie Madden MRN: [id number] Date of Birth: Mar 27, 1966

## 2019-08-27 ENCOUNTER — Encounter: Payer: Self-pay | Admitting: Physical Medicine & Rehabilitation

## 2019-08-27 ENCOUNTER — Encounter
Payer: Managed Care, Other (non HMO) | Attending: Physical Medicine & Rehabilitation | Admitting: Physical Medicine & Rehabilitation

## 2019-08-27 ENCOUNTER — Other Ambulatory Visit: Payer: Self-pay

## 2019-08-27 VITALS — BP 160/108 | HR 84 | Temp 97.7°F

## 2019-08-27 DIAGNOSIS — I610 Nontraumatic intracerebral hemorrhage in hemisphere, subcortical: Secondary | ICD-10-CM | POA: Diagnosis present

## 2019-08-27 MED ORDER — CITALOPRAM HYDROBROMIDE 10 MG PO TABS: 10.0000 mg | ORAL_TABLET | Freq: Every day | ORAL | 5 refills | Status: DC

## 2019-08-27 NOTE — Patient Instructions (Signed)
  You don't need to worry about getting cards like I have - use 4 very common objects (toothbrush, coffee cup, pen, spoon, fork, etc) for identification of objects. [facility] is a website that may be helpful

## 2019-08-27 NOTE — Progress Notes (Signed)
Subjective:  Transitional care call completed.  Transitional care visit today after hospital stay.  Discharged home 1 week ago  Patient ID: Melanie Madden, female    DOB: [birth year], 54 y.o.   MRN: [id number] 54 year old right-handed female with history of hypertension, anxiety, migraine headaches and tobacco abuse.  Per chart review and husband,  patient lives with spouse and was independent prior to admission.  She presented on 07/09/2019 with right-sided weakness and aphasia after returning home from work.  Cranial CT scan showed intraparenchymal hematoma with a volume of 44 cc centered in the left basal ganglia most consistent with hypertensive hemorrhage.  5 mm rightward midline shift.  MRA of the head negative.  Echocardiogram with ejection fraction of 65%.  Patient did not receive TPA due to [place].  Admission labs SARS coronavirus negative.  Patient initially on 3% saline.  Cleviprex for blood pressure control later discontinued.  Her diet was advanced to dysphagia #2 nectar thick liquid.  She was cleared to begin subcutaneous heparin for DVT prophylaxis 07/14/2019.  Therapy evaluations completed and patient was admitted for a comprehensive rehab program.  HPI Patient returns she is with her husband today.  She was discharged home from the inpatient rehabilitation service on 08/21/2019.  She has a hired caregiver at home.  Her husband states that she has not fallen since returning home.  She did fall once at the hospital.  She did not have any significant injury.  Husband had noted some bruising on the cheek on the right side and would like me to look at that. The patient has not followed up with primary care yet.  Primary care did request a virtual visit.  I encouraged the patient's husband to make an appointment for this. The patient has not had a neurology visit at this point.  We discussed the location of the neurology office. The patient has started outpatient therapy with speech therapy yesterday  and physical therapy earlier this week No spasms or seizures noted.  Patient continues to require total care with her ADLs as well as mobility.  She is starting to work on [place] activities with physical therapy.  During the inpatient rehabilitation stay patient had gradually diminishing blood pressures.  Lisinopril and amlodipine Were started during her hospitalization and was up to lisinopril 20 mg twice daily as well as Norvasc 10 mg daily.  Her blood pressures however came down and these were weaned off and she was normotensive by the time of discharge.  Pain Inventory Average Pain 0 Pain Right Now 0 My pain is no pain  In the last 24 hours, has pain interfered with the following? General activity 0 Relation with others 0 Enjoyment of life 0 What TIME of day is your pain at its worst? no pain Sleep (in general) NA  Pain is worse with: no pain Pain improves with: no pain Relief from Meds: no pain  Mobility how many minutes can you walk? 0 use a wheelchair  Function disabled: date disabled .  Neuro/Psych trouble walking  Prior Studies transitional care  Physicians involved in your care transitional care   Family History  Problem Relation Age of Onset  . Alzheimer's disease Mother   . Hypertension Mother   . Diabetes Mother   . Thyroid disease Mother   . Deep vein thrombosis Maternal Grandmother   . Colon cancer Neg Hx   . Colon polyps Neg Hx   . Esophageal cancer Neg Hx   . Pancreatic cancer Neg Hx   .  Rectal cancer Neg Hx   . Stomach cancer Neg Hx   . Breast cancer Neg Hx    Social History   Socioeconomic History  . Marital status: Significant Other    Spouse name: Not on file  . Number of children: Not on file  . Years of education: Not on file  . Highest education level: Not on file  Occupational History  . Not on file  Tobacco Use  . Smoking status: Former Research scientist (life sciences)  . Smokeless tobacco: Never Used  Substance and Sexual Activity  . Alcohol  use: Yes    Alcohol/week: 0.0 standard drinks    Comment: social   . Drug use: No  . Sexual activity: Yes    Partners: Male  Other Topics Concern  . Not on file  Social History Narrative  . Not on file   Social Determinants of Health   Financial Resource Strain:   . Difficulty of Paying Living Expenses: Not on file  Food Insecurity:   . Worried About Charity fundraiser in the Last Year: Not on file  . Ran Out of Food in the Last Year: Not on file  Transportation Needs:   . Lack of Transportation (Medical): Not on file  . Lack of Transportation (Non-Medical): Not on file  Physical Activity:   . Days of Exercise per Week: Not on file  . Minutes of Exercise per Session: Not on file  Stress:   . Feeling of Stress : Not on file  Social Connections:   . Frequency of Communication with Friends and Family: Not on file  . Frequency of Social Gatherings with Friends and Family: Not on file  . Attends Religious Services: Not on file  . Active Member of Clubs or Organizations: Not on file  . Attends Archivist Meetings: Not on file  . Marital Status: Not on file   Past Surgical History:  Procedure Laterality Date  . ANTERIOR CRUCIATE LIGAMENT REPAIR Left   . BUNIONECTOMY Right   . LASIK    . LITHOTRIPSY    . MENISCUS REPAIR Left   . NASAL SEPTUM SURGERY    . SHOULDER SURGERY    . VARICOSE VEIN SURGERY Left    Past Medical History:  Diagnosis Date  . Allergy   . Anxiety   . Arthritis   . High triglycerides   . Kidney stones   . Migraines   . Recurrent sinus infections    BP (!) 160/108   Pulse 84   Temp 97.7 F (36.5 C)   SpO2 98%   Opioid Risk Score:   Fall Risk Score:  `1  Depression screen PHQ 2/9  No flowsheet data found.  Review of Systems  Constitutional: Negative.   HENT: Negative.   Eyes: Negative.   Respiratory: Negative.   Cardiovascular: Negative.   Gastrointestinal: Negative.   Endocrine: Negative.   Genitourinary: Negative.     Musculoskeletal: Positive for gait problem.  Skin: Negative.   Allergic/Immunologic: Negative.   Neurological: Positive for speech difficulty.  Hematological: Negative.   Psychiatric/Behavioral: Positive for agitation.  All other systems reviewed and are negative.      Objective:   Physical Exam Vitals and nursing note reviewed.  Constitutional:      Appearance: Normal appearance.  HENT:     Head: Normocephalic and atraumatic.     Comments: Minimal bruising right zygomatic arch nontender palpation no tenderness around the TMJ or manubrium.  No neck pain to palpation  Mouth/Throat:     Mouth: Mucous membranes are dry.     Pharynx: Oropharynx is clear.  Eyes:     Extraocular Movements: Extraocular movements intact.     Conjunctiva/sclera: Conjunctivae normal.     Pupils: Pupils are equal, round, and reactive to light.  Cardiovascular:     Rate and Rhythm: Normal rate and regular rhythm.     Heart sounds: No murmur.  Pulmonary:     Effort: Pulmonary effort is normal.     Breath sounds: Normal breath sounds. No stridor. No wheezing.  Abdominal:     General: Abdomen is flat. Bowel sounds are normal.     Palpations: Abdomen is soft.     Tenderness: There is no abdominal tenderness.  Musculoskeletal:        General: Deformity present. Normal range of motion.     Comments: Patient has pain with right shoulder abduction. 1 fingerbreadth shoulder subluxation on the right side.  Skin:    General: Skin is warm and dry.  Neurological:     Mental Status: She is alert.  Psychiatric:     Comments: Some emotional lability noted at the end of the visit with crying.  Husband's noted this at home usually toward the end of the day.  Patient admits to being sad    Motor strength is 0/5 in the right deltoid, bicep, tricep, grip, hip flexor, knee extensor, ankle dorsiflexor  Sensation is reduced to pinch in the right upper limb. Speech can say R but no words.  She has difficulty  following commands even with her left upper extremity and is unable to follow the command touch her nose or touch her ear.  She is able to close her eyes to command.       Assessment & Plan:  #1.  Large left basal ganglia intracranial hemorrhage likely hypertensive.  While hospitalized her blood pressure medications were weaned off but her blood pressure is up again today.  I contacted her primary care physician Dr. Murvin Natal office with today's blood pressure. In terms of her rehabilitation she will continue outpatient PT OT and speech.  I will see her back in 4 to 6 weeks.  I have made referral to neurology for follow-up.  #2.  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  We will start citalopram 10 mg daily.  I do not think neuropsychology referral is needed because of her inability to communicate.  3.  History of shoulder pain now with shoulder subluxation and pain.  I will inject her shoulder today with corticosteroid.  Husband and patient are in agreement  Shoulder injectionRIght subacromial Without ultrasound guidance)  Indication:RIght Shoulder pain not relieved by medication management and other conservative care.  Informed consent was obtained after describing risks and benefits of the procedure with the patient, this includes bleeding, bruising, infection and medication side effects. The patient wishes to proceed and has given written consent. Patient was placed in a seated position. The RIght shoulder was marked and prepped with betadine in the subacromial area. A 25-gauge 1-1/2 inch needle was inserted into the subacromial area. After negative draw back for blood, a solution containing 1 mL of 6 mg per ML betamethasone and 4 mL of 1% lidocaine was injected. A band aid was applied. The patient tolerated the procedure well. Post procedure instructions were  given.

## 2019-08-27 NOTE — Patient Instructions (Addendum)
Citalopram oral solution What is this medicine? CITALOPRAM (sye TAL oh pram) is used to treat depression. This medicine may be used for other purposes; ask your health care provider or pharmacist if you have questions. COMMON BRAND NAME(S): Celexa What should I tell my health care provider before I take this medicine? They need to know if you have any of these conditions:  bleeding disorders  bipolar disorder or a family history of bipolar disorder  glaucoma  heart disease  history of irregular heartbeat  kidney disease  liver disease  low levels of magnesium or potassium in the blood  receiving electroconvulsive therapy  seizures  suicidal thoughts, plans, or attempt; a previous suicide attempt by you or a family member  take medicines that treat or prevent blood clots  thyroid disease  an unusual or allergic reaction to citalopram, escitalopram, other medicines, foods, dyes, or preservatives  pregnant or trying to become pregnant  breast-feeding How should I use this medicine? Take this medicine by mouth. Follow the directions on the prescription label. Use a specially marked spoon or container to measure your medicine. Ask your pharmacist if you do not have one. Household spoons are not accurate. This medicine can be taken with or without food. Take your medicine at regular intervals. Do not take your medicine more often than directed. Do not stop taking this medicine suddenly except upon the advice of your doctor. Stopping this medicine too quickly may cause serious side effects or your condition may worsen. A special MedGuide will be given to you by the pharmacist with each prescription and refill. Be sure to read this information carefully each time. Talk to your pediatrician regarding the use of this medicine in children. Special care may be needed. Patients over 75 years old may have a stronger reaction and need a smaller dose. Overdosage: If you think you have  taken too much of this medicine contact a poison control center or emergency room at once. NOTE: This medicine is only for you. Do not share this medicine with others. What if I miss a dose? If you miss a dose, take it as soon as you can. If it is almost time for your next dose, take only that dose. Do not take double or extra doses. What may interact with this medicine? Do not take this medicine with any of the following medications:  certain medicines for fungal infections like fluconazole, itraconazole, ketoconazole, posaconazole, voriconazole  cisapride  dronedarone  escitalopram  linezolid  MAOIs like Carbex, Eldepryl, Marplan, Nardil, and Parnate  methylene blue (injected into a vein)  pimozide  thioridazine This medicine may also interact with the following medications:  alcohol  amphetamines  aspirin and aspirin-like medicines  carbamazepine  certain medicines for depression, anxiety, or psychotic disturbances  certain medicines for infections like chloroquine, clarithromycin, erythromycin, furazolidone, isoniazid, pentamidine  certain medicines for migraine headaches like almotriptan, eletriptan, frovatriptan, naratriptan, rizatriptan, sumatriptan, zolmitriptan  certain medicines for sleep  certain medicines that treat or prevent blood clots like dalteparin, enoxaparin, warfarin  cimetidine  diuretics  dofetilide  fentanyl  lithium  methadone  metoprolol  NSAIDs, medicines for pain and inflammation, like ibuprofen or naproxen  omeprazole  other medicines that prolong the QT interval (cause an abnormal heart rhythm)  procarbazine  rasagiline  supplements like St. John's wort, kava kava, valerian  tramadol  tryptophan  ziprasidone This list may not describe all possible interactions. Give your health care provider a list of all the medicines, herbs, non-prescription drugs,  or dietary supplements you use. Also tell them if you smoke,  drink alcohol, or use illegal drugs. Some items may interact with your medicine. What should I watch for while using this medicine? Tell your doctor if your symptoms do not get better or if they get worse. Visit your doctor or health care professional for regular checks on your progress. Because it may take several weeks to see the full effects of this medicine, it is important to continue your treatment as prescribed by your doctor. Patients and their families should watch out for new or worsening thoughts of suicide or depression. Also watch out for sudden changes in feelings such as feeling anxious, agitated, panicky, irritable, hostile, aggressive, impulsive, severely restless, overly excited and hyperactive, or not being able to sleep. If this happens, especially at the beginning of treatment or after a change in dose, call your health care professional. Dennis Bast may get drowsy or dizzy. Do not drive, use machinery, or do anything that needs mental alertness until you know how this medicine affects you. Do not stand or sit up quickly, especially if you are an older patient. This reduces the risk of dizzy or fainting spells. Alcohol may interfere with the effect of this medicine. Avoid alcoholic drinks. Your mouth may get dry. Chewing sugarless gum or sucking hard candy, and drinking plenty of water will help. Contact your doctor if the problem does not go away or is severe. What side effects may I notice from receiving this medicine? Side effects that you should report to your doctor or health care professional as soon as possible:  allergic reactions like skin rash, itching or hives, swelling of the face, lips, or tongue  anxious  black, tarry stools  breathing problems  changes in vision  chest pain  confusion  elevated mood, decreased need for sleep, racing thoughts, impulsive behavior  eye pain  fast, irregular heartbeat  feeling faint or lightheaded, falls  feeling agitated,  angry, or irritable  hallucination, loss of contact with reality  loss of balance or coordination  loss of memory  painful or prolonged erections  restlessness, pacing, inability to keep still  seizures  stiff muscles  suicidal thoughts or other mood changes  trouble sleeping  unusual bleeding or bruising  unusually weak or tired  vomiting Side effects that usually do not require medical attention (report to your doctor or health care professional if they continue or are bothersome):  change in appetite or weight  change in sex drive or performance  dizziness  headache  increased sweating  indigestion, nausea  tremors This list may not describe all possible side effects. Call your doctor for medical advice about side effects. You may report side effects to FDA at 1-800-FDA-1088. Where should I keep my medicine? Keep out of reach of children. Store at room temperature between 15 and 30 degrees C (59 and 86 degrees F). Throw away any unused medicine after the expiration date. NOTE: This sheet is a summary. It may not cover all possible information. If you have questions about this medicine, talk to your doctor, pharmacist, or health care provider.  2020 Elsevier/Gold Standard (2018-07-07 09:04:06)

## 2019-08-27 NOTE — Therapy (Signed)
[facility] [address] [place], [place], [address] Phone: [phone]   Fax:  [phone]  Speech Language Pathology Evaluation  Patient Details  Name: Nichelle Lawyer MRN: [id number] Date of Birth: Jul 01, 1966 Referring Provider (SLP): Alysia Penna, MD   Encounter Date: 08/26/2019  End of Session - 08/27/19 1249    Visit Number  1    Number of Visits  25    Date for SLP Re-Evaluation  12/04/19    Authorization Type  cigna    Authorization Time Period  60 DAYS of therapy regardless of number disciplines per day    SLP Start Time  [id number]    SLP Stop Time   1531    SLP Time Calculation (min)  39 min    Activity Tolerance  Patient tolerated treatment well   likely frustration with receptive language tasks      Past Medical History:  Diagnosis Date  . Allergy   . Anxiety   . Arthritis   . High triglycerides   . Kidney stones   . Migraines   . Recurrent sinus infections     Past Surgical History:  Procedure Laterality Date  . ANTERIOR CRUCIATE LIGAMENT REPAIR Left   . BUNIONECTOMY Right   . LASIK    . LITHOTRIPSY    . MENISCUS REPAIR Left   . NASAL SEPTUM SURGERY    . SHOULDER SURGERY    . VARICOSE VEIN SURGERY Left     There were no vitals filed for this visit.      SLP Evaluation [facility] - 08/27/19 0001      SLP Visit Information   SLP Received On  08/26/19    Referring Provider (SLP)  Alysia Penna, MD    Onset Date  07-09-19    Medical Diagnosis  CVA      Subjective   Subjective  No "S"     Patient/Family Stated Goal  Improve ability to communicate and understand communication  - husband      General Information   HPI  Pt with lt BG CVA 07-09-19. Pertinent PMH: anxiety, migraines, HTN. Pt spent approx 30 days on CIR. Pt on dys III/thin currently.      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   Behaviors  Impulsive;Perseveration;Poor frustration tolerance   appeared frustrated IDing numbers, letters, write hubby name     Auditory Comprehension   Overall Auditory Comprehension  Impaired    Yes/No Questions  Impaired    Basic Biographical Questions  76-100% accurate    Basic Immediate Environment Questions  50-74% accurate   unsure of accuracy of this due to apraxia   Commands  Impaired    One Step Basic Commands  50-74% accurate    Conversation  --   nonfunctionl   Interfering Components  Motor planning;Attention   auditory comprehension   Overall Auditory Comprehension Comments  On yes/no questions  of Western (WAB), pt with 5/5 on first 5 responses and then answered "no" as her next 12 responses. SLP ?s motor perseveration  In picutre ID, pt's impulsivity negatively affected outcomes with auditory comprehension.      Expression   Primary Mode of Expression  Nonverbal - gestures      Verbal Expression   Overall Verbal Expression  Impaired    Initiation  Impaired    Automatic Speech  --   none   Level of Generative/Spontaneous Verbalization  --   grunts with "yes" x1 on yes/no questions   Repetition  Impaired    Level of Impairment  Word level    Naming  Impairment   difficult to assess due to verbal apraxia   Other Verbal Expression Comments  Pt with predominantly grunts today. "Yes" heard once for yes/no questions. Furhter testing of expressive language necessary      Written Expression   Dominant Hand  Right    Written Expression  Exceptions to [facility]    Dictation Ability  --   first  name-100%;last name-[place];hubby's name-2/5 letters(frustr   Overall Writen Expression  Pt wrote her first name, needed self correction for last name, got 2/5 letters of husband's name and could not self correct- and "tossed" pencil on table       Motor Speech   Overall Motor Speech  Impaired    Respiration  Within functional limits    Articulation  Impaired    Level of Impairment  Word    Motor Planning  Impaired    Level of Impairment  --   PHONEME level   Motor Speech Errors  Aware;Unaware;Inconsistent                      SLP Education - 08/27/19 1246    Education Details  home tasks (practice object ID f:4 with common objects)    Person(s) Educated  Patient;Spouse    Methods  Explanation;Demonstration    Comprehension  Verbalized understanding;Need further instruction       SLP Short Term Goals - 08/27/19 1303      SLP SHORT TERM GOAL #1   Title  pt will simultaneously with SLP produce 3 different phonemes 50% of the time over three sessions    Time  6    Period  Weeks    Status  New      SLP SHORT TERM GOAL #2   Title  pt will ID objects salient to pt in f:4 80% of the time over 3 sessions    Time  6    Period  Weeks    Status  New      SLP SHORT TERM GOAL #3   Title  pt will write her full name on first attempt over three sessions    Time  6    Period  Weeks    Status  New      SLP SHORT TERM GOAL #4   Title  pt will attempt expressive communication via multimodal means during 5 sessions    Time  6    Period  Weeks    Status  New      SLP SHORT TERM GOAL #5   Title  pt will demonstrate undertanding of simple 1-step functional commands with 70% accuracy with multimodal cues over 3 sessions    Time  6    Period  Weeks    Status  New       SLP  Long Term Goals - 08/27/19 1310      SLP LONG TERM GOAL #1   Title  pt's diet will be appropriately upgraded as needed by SLP    Time  12    Period  Weeks   or 25 total visits, for all LTGs   Status   New      SLP LONG TERM GOAL #2   Title  pt will produce 5 simple words/words pertinent to pt (family or pet names, etc) simultaneously with SLP and multimodal cues over three sessions    Time  12    Period  Weeks      SLP LONG TERM GOAL #3   Title  pt will demo understanding of simple yes/no questions pertinent to desires/wants with multimodal cues 90% of the time over 3 sessions    Time  12    Period  Weeks    Status  New      SLP LONG TERM GOAL #4   Title  a device trial with a speech generating device will be initiated within the first 20 ST visits    Time  12    Period  Weeks    Status  New       Plan - 08/27/19 1252    Clinical Impression Statement  Pt currently on dys III/thin diet - no complaints with this diet to SLP knowledge. She presents with, subjectively, cognitive communication deficits in at least the areas of basic attention and basic awareness, a severe/profound expressive aphasia with verbal apraxia, and severe/profound receptive aphasia ("global aphasia"). Expressive communiction was rare grunting Leta Speller" of varying length of <1 up to 2 seconds) and one functional approximation of "yes" during yes/no questions subtest on the Western Aphasia Battery-revised (WAB). More of WAB-R will be completed next session. Pt had difficulty following one-step commands (e.g., "point to your chin"). She dmmonstrated some frustration with letter and number ID, adn with inability to write husband's name. She wrote her first name on first attempt, and her last name on second attempt.Unsure if pt can complete entire WAB due to frustration possibly limiting pt results. At some point pt may benefit from a speech generating device. She would benefit from skilled ST focusing on incr'ing communicative effectiveness via multimodal means, incr'ing cognition in functional ways as able given pt's language deficit, and monitoring/upgrading pt's diet as appropriate.    Speech Therapy Frequency  2x / week     Duration  --   12 weeks, or 25 total ST visits   Treatment/Interventions  Diet toleration management by SLP;Trials of upgraded texture/liquids;Language facilitation;Internal/external aids;Cognitive reorganization;Cueing hierarchy;SLP instruction and feedback;Patient/family education;Compensatory strategies;Multimodal communcation approach;Functional tasks;Environmental controls    Potential to Achieve Goals  Fair    Potential Considerations  Severity of impairments    Consulted and Agree with Plan of Care  Patient;Family member/caregiver    Family Member Consulted  husband [name]       Patient will benefit from skilled therapeutic intervention in order to improve the following deficits and impairments:   Aphasia  Verbal apraxia  Cognitive communication deficit  Dysphagia, unspecified type    Problem List Patient Active Problem List   Diagnosis Date Noted  . Acute blood loss anemia   . Hypoalbuminemia due to protein-calorie malnutrition ([place])   . Thrombocytopenia ([place])   . Dysphagia 07/29/2019  . 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 Hypernatremia   . Leukocytosis   . Essential hypertension   . Global aphasia   .  Cytotoxic brain edema ([place]) 07/10/2019  . ICH (intracerebral hemorrhage) ([place]) 07/09/2019  . Anxiety state 02/21/2015  . Cephalalgia 12/21/2014    [place] ,[place], [place]  08/27/2019, 1:20 PM  [facility] [address] [place], [place], [address] Phone: [phone]   Fax:  [phone]  Name: Aryss Dibley MRN: [id number] Date of Birth: [birth year]

## 2019-08-29 ENCOUNTER — Other Ambulatory Visit: Payer: Self-pay

## 2019-08-29 ENCOUNTER — Emergency Department (HOSPITAL_COMMUNITY)
Admission: EM | Admit: 2019-08-29 | Discharge: 2019-08-29 | Disposition: A | Discharge status: Home / Self Care | Payer: Managed Care, Other (non HMO) | Attending: Emergency Medicine | Admitting: Emergency Medicine

## 2019-08-29 ENCOUNTER — Emergency Department (HOSPITAL_COMMUNITY): Payer: Managed Care, Other (non HMO)

## 2019-08-29 DIAGNOSIS — I10 Essential (primary) hypertension: Secondary | ICD-10-CM | POA: Insufficient documentation

## 2019-08-29 DIAGNOSIS — R0789 Other chest pain: Secondary | ICD-10-CM | POA: Diagnosis present

## 2019-08-29 DIAGNOSIS — Z79899 Other long term (current) drug therapy: Secondary | ICD-10-CM | POA: Insufficient documentation

## 2019-08-29 DIAGNOSIS — Z87891 Personal history of nicotine dependence: Secondary | ICD-10-CM | POA: Insufficient documentation

## 2019-08-29 DIAGNOSIS — Z8673 Personal history of transient ischemic attack (TIA), and cerebral infarction without residual deficits: Secondary | ICD-10-CM | POA: Diagnosis not present

## 2019-08-29 DIAGNOSIS — R479 Unspecified speech disturbances: Secondary | ICD-10-CM | POA: Diagnosis not present

## 2019-08-29 LAB — CBC WITH DIFFERENTIAL/PLATELET
Abs Immature Granulocytes: 0.04 10*3/uL (ref 0.00–0.07)
Basophils Absolute: 0 10*3/uL (ref 0.0–0.1)
Basophils Relative: 1 %
Eosinophils Absolute: 0 10*3/uL (ref 0.0–0.5)
Eosinophils Relative: 1 %
HCT: 41.8 % (ref 36.0–46.0)
Hemoglobin: 13.8 g/dL (ref 12.0–15.0)
Immature Granulocytes: 1 %
Lymphocytes Relative: 34 %
Lymphs Abs: 2.1 10*3/uL (ref 0.7–4.0)
MCH: 30.5 pg (ref 26.0–34.0)
MCHC: 33 g/dL (ref 30.0–36.0)
MCV: 92.3 fL (ref 80.0–100.0)
Monocytes Absolute: 0.6 10*3/uL (ref 0.1–1.0)
Monocytes Relative: 10 %
Neutro Abs: 3.4 10*3/uL (ref 1.7–7.7)
Neutrophils Relative %: 53 %
Platelets: 201 10*3/uL (ref 150–400)
RBC: 4.53 MIL/uL (ref 3.87–5.11)
RDW: 14.6 % (ref 11.5–15.5)
WBC: 6.3 10*3/uL (ref 4.0–10.5)
nRBC: 0 % (ref 0.0–0.2)

## 2019-08-29 LAB — COMPREHENSIVE METABOLIC PANEL
ALT: 22 U/L (ref 0–44)
AST: 14 U/L — ABNORMAL LOW (ref 15–41)
Albumin: 3.3 g/dL — ABNORMAL LOW (ref 3.5–5.0)
Alkaline Phosphatase: 62 U/L (ref 38–126)
Anion gap: 10 (ref 5–15)
BUN: 16 mg/dL (ref 6–20)
CO2: 23 mmol/L (ref 22–32)
Calcium: 9.9 mg/dL (ref 8.9–10.3)
Chloride: 106 mmol/L (ref 98–111)
Creatinine, Ser: 0.63 mg/dL (ref 0.44–1.00)
GFR calc Af Amer: >60 mL/min (ref >60)
GFR calc non Af Amer: >60 mL/min (ref >60)
Glucose, Bld: 102 mg/dL — ABNORMAL HIGH (ref 70–99)
Potassium: 3.4 mmol/L — ABNORMAL LOW (ref 3.5–5.1)
Sodium: 139 mmol/L (ref 135–145)
Total Bilirubin: 0.5 mg/dL (ref 0.3–1.2)
Total Protein: 6.4 g/dL — ABNORMAL LOW (ref 6.5–8.1)

## 2019-08-29 LAB — TROPONIN I (HIGH SENSITIVITY): Troponin I (High Sensitivity): 3 ng/L (ref <18)

## 2019-08-29 MED ORDER — LORAZEPAM 2 MG/ML IJ SOLN
0.5000 mg | Freq: Once | INTRAMUSCULAR | Status: AC
  Administered 2019-08-29: 0.5 mg via INTRAVENOUS
  Filled 2019-08-29: qty 1

## 2019-08-29 MED ORDER — LORAZEPAM 2 MG/ML IJ SOLN
1.0000 mg | Freq: Once | INTRAMUSCULAR | Status: AC
  Administered 2019-08-29: 20:00:00 1 mg via INTRAVENOUS
  Filled 2019-08-29: qty 1

## 2019-08-29 MED ORDER — LORAZEPAM 1 MG PO TABS: 1.0000 mg | ORAL_TABLET | Freq: Three times a day (TID) | ORAL | 0 refills | Status: DC | PRN

## 2019-08-29 NOTE — ED Triage Notes (Signed)
Patient brought in by husband, states patient was just recently discharged from hospital after having a stroke causing right sided paralysis and pt now non verbal. Husband was attempting to give patient medications when patient became very upset and having chest pain. Patient points to left chest and denies pain moving anywhere. Husband began checking patients BP and today was 173/113 PTA.

## 2019-08-29 NOTE — ED Notes (Signed)
Pt is refusing blood work at this time, appears very agitated, and is denying chest pain. Communication barrier causing increased agitation.

## 2019-08-29 NOTE — ED Provider Notes (Signed)
[facility] EMERGENCY DEPARTMENT Provider Note   CSN: [id number] Arrival date & time: 08/29/19  1651     History Chief Complaint  Patient presents with  . Chest Pain    Melanie Madden is a 54 y.o. female.  Patient recently discharged from hospital for stroke.  Patient is nonverbal cannot move right side.  Husband felt like the patient was communicating that she was hurting in her chest.  The history is provided by a relative. History limited by: Patient nonverbal.  Chest Pain Pain location:  Substernal area Pain quality comment:  Unable to determine Radiates to: Unable to determine. Pain severity:  Moderate Onset quality:  Sudden Timing:  Unable to specify Chronicity:  New Context: not breathing        Past Medical History:  Diagnosis Date  . Allergy   . Anxiety   . Arthritis   . High triglycerides   . Kidney stones   . Migraines   . Recurrent sinus infections     Patient Active Problem List   Diagnosis Date Noted  . 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 Acute blood loss anemia   . Hypoalbuminemia due to protein-calorie malnutrition ([place])   . Thrombocytopenia ([place])   . Dysphagia 07/29/2019  . 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 Hypernatremia   . Leukocytosis   . Essential hypertension   . Global aphasia   . Cytotoxic brain edema ([place]) 07/10/2019  . ICH (intracerebral hemorrhage) ([place]) 07/09/2019  . Anxiety state 02/21/2015  . Cephalalgia 12/21/2014    Past Surgical History:  Procedure Laterality Date  . ANTERIOR CRUCIATE LIGAMENT REPAIR Left   . BUNIONECTOMY Right   . LASIK    . LITHOTRIPSY    . MENISCUS REPAIR Left   . NASAL SEPTUM SURGERY    . SHOULDER SURGERY    . VARICOSE VEIN SURGERY Left      OB History   No obstetric history on file.     Family History  Problem Relation Age of Onset  . Alzheimer's disease Mother   . Hypertension Mother   . Diabetes  Mother   . Thyroid disease Mother   . Deep vein thrombosis Maternal Grandmother   . Colon cancer Neg Hx   . Colon polyps Neg Hx   . Esophageal cancer Neg Hx   . Pancreatic cancer Neg Hx   . Rectal cancer Neg Hx   . Stomach cancer Neg Hx   . Breast cancer Neg Hx     Social History   Tobacco Use  . Smoking status: Former Research scientist (life sciences)  . Smokeless tobacco: Never Used  Substance Use Topics  . Alcohol use: Yes    Alcohol/week: 0.0 standard drinks    Comment: social   . Drug use: No    Home Medications Prior to Admission medications   Medication Sig Start Date End Date Taking? Authorizing Provider  acetaminophen (TYLENOL) 325 MG tablet Take 2 tablets (650 mg total) by mouth every 4 (four) hours as needed for mild pain (or temp > 37.5 C (99.5 F)). 08/20/19   Angiulli, Lavon Paganini, PA-C  atorvastatin (LIPITOR) 20 MG tablet Take 1 tablet (20 mg total) by mouth daily at 6 PM. 08/20/19   Angiulli, Lavon Paganini, PA-C  B Complex-C (B-COMPLEX WITH VITAMIN C) tablet Take 1 tablet by mouth daily.    [provider]  BIOTIN PO Take by mouth.    [provider]  cetirizine (ZYRTEC) 10  MG tablet Take 10 mg by mouth daily.    [provider]  cholecalciferol (VITAMIN D) 1000 units tablet Take 1,000 Units by mouth daily. 2,000 units daily    [provider]  citalopram (CELEXA) 10 MG tablet Take 1 tablet (10 mg total) by mouth daily. 08/27/19   Kirsteins, [name], MD  Cyanocobalamin (VITAMIN B-12 PO) Take by mouth.    [provider]  docusate sodium (COLACE) 100 MG capsule Take 100 mg by mouth daily.    [provider]  Omega-3 Fatty Acids (FISH OIL) 1200 MG CPDR Take by mouth.    [provider]  VITAMIN E PO Take by mouth.    [provider]  zinc sulfate (ZINC-220) 220 (50 Zn) MG capsule Take 220 mg by mouth daily.    [provider]    Allergies    Amoxicillin and Shellfish allergy  Review of Systems   Review of Systems    Unable to perform ROS: Mental status change  Cardiovascular: Positive for chest pain.    Physical Exam Updated Vital Signs BP (!) 113/101   Pulse 75   Temp 98 F (36.7 C) (Oral)   Resp 18   SpO2 100%   Physical Exam Vitals and nursing note reviewed.  Constitutional:      Appearance: She is well-developed.  HENT:     Head: Normocephalic.     Right Ear: Tympanic membrane normal.     Nose: Nose normal.  Eyes:     General: No scleral icterus.    Conjunctiva/sclera: Conjunctivae normal.  Neck:     Thyroid: No thyromegaly.  Cardiovascular:     Rate and Rhythm: Normal rate and regular rhythm.     Heart sounds: No murmur. No friction rub. No gallop.   Pulmonary:     Breath sounds: No stridor. No wheezing or rales.  Chest:     Chest wall: No tenderness.  Abdominal:     General: There is no distension.     Tenderness: There is no abdominal tenderness. There is no rebound.  Musculoskeletal:        General: Normal range of motion.     Cervical back: Neck supple.  Lymphadenopathy:     Cervical: No cervical adenopathy.  Skin:    Findings: No erythema or rash.  Neurological:     Mental Status: She is alert.     Motor: No abnormal muscle tone.     Coordination: Coordination normal.     Comments: Alert.  Patient had recent stroke that has prevented her from speaking and moving her right side  Psychiatric:     Comments: Anxious     ED Results / Procedures / Treatments   Labs (all labs ordered are listed, but only abnormal results are displayed) Labs Reviewed  COMPREHENSIVE METABOLIC PANEL - Abnormal; Notable for the following components:      Result Value   Potassium 3.4 (*)    Glucose, Bld 102 (*)    Total Protein 6.4 (*)    Albumin 3.3 (*)    AST 14 (*)    All other components within normal limits  CBC WITH DIFFERENTIAL/PLATELET  TROPONIN I (HIGH SENSITIVITY)  TROPONIN I (HIGH SENSITIVITY)    EKG EKG Interpretation  Date/Time:  Saturday August 29 2019  16:53:43 EST Ventricular Rate:  81 PR Interval:  102 QRS Duration: 76 QT Interval:  370 QTC Calculation: 429 R Axis:   66 Text Interpretation: Sinus rhythm with short PR Otherwise  normal ECG Confirmed by Milton Ferguson [phone]) on 08/29/2019 5:32:40 PM   Radiology DG Chest Port 1 View  Result Date: 08/29/2019 CLINICAL DATA:  Pain. EXAM: PORTABLE CHEST 1 VIEW COMPARISON:  None. FINDINGS: The heart, hila, mediastinum, lungs, and pleura are normal. IMPRESSION: No active disease. Electronically Signed   By: Dorise Bullion III M.D   On: 08/29/2019 18:00    Procedures Procedures (including critical care time)  Medications Ordered in ED Medications  LORazepam (ATIVAN) injection 0.5 mg (0.5 mg Intravenous Given 08/29/19 1813)  LORazepam (ATIVAN) injection 1 mg (1 mg Intravenous Given 08/29/19 2025)    ED Course  I have reviewed the triage vital signs and the nursing notes.  Pertinent labs & imaging results that were available during my care of the patient were reviewed by me and considered in my medical decision making (see chart for details).    MDM Rules/Calculators/A&P                     Patient was evaluated for chest pain and had 2 normal troponins and unremarkable EKG and chest x-ray.  Doubt discomfort is coronary artery related.  Rest of labs unremarkable.  I believe most of this is stress and anxiety and she is given some Ativan and will follow up  Final Clinical Impression(s) / ED Diagnoses Final diagnoses:  None    Rx / DC Orders ED Discharge Orders    None       Milton Ferguson, MD 08/31/19 [phone]

## 2019-08-29 NOTE — ED Notes (Signed)
Patient verbalizes understanding of discharge instructions. Opportunity for questioning and answers were provided. Armband removed by staff, pt discharged from ED.  

## 2019-08-29 NOTE — Discharge Instructions (Addendum)
Follow-up with your family doctor next week for recheck.  Use the Ativan for moderate to severe anxiety or stress

## 2019-08-31 ENCOUNTER — Other Ambulatory Visit: Payer: Self-pay

## 2019-08-31 ENCOUNTER — Ambulatory Visit: Payer: Managed Care, Other (non HMO) | Attending: Physical Medicine and Rehabilitation

## 2019-08-31 ENCOUNTER — Ambulatory Visit: Payer: Managed Care, Other (non HMO)

## 2019-08-31 DIAGNOSIS — R482 Apraxia: Secondary | ICD-10-CM | POA: Diagnosis present

## 2019-08-31 DIAGNOSIS — R1310 Dysphagia, unspecified: Secondary | ICD-10-CM | POA: Diagnosis present

## 2019-08-31 DIAGNOSIS — R2689 Other abnormalities of gait and mobility: Secondary | ICD-10-CM

## 2019-08-31 DIAGNOSIS — R4701 Aphasia: Secondary | ICD-10-CM | POA: Diagnosis present

## 2019-08-31 DIAGNOSIS — I69151 Hemiplegia and hemiparesis following nontraumatic intracerebral hemorrhage affecting right dominant side: Secondary | ICD-10-CM | POA: Insufficient documentation

## 2019-08-31 DIAGNOSIS — M6281 Muscle weakness (generalized): Secondary | ICD-10-CM | POA: Diagnosis present

## 2019-08-31 DIAGNOSIS — I69219 Unspecified symptoms and signs involving cognitive functions following other nontraumatic intracranial hemorrhage: Secondary | ICD-10-CM | POA: Diagnosis present

## 2019-08-31 DIAGNOSIS — R209 Unspecified disturbances of skin sensation: Secondary | ICD-10-CM | POA: Diagnosis present

## 2019-08-31 DIAGNOSIS — I69153 Hemiplegia and hemiparesis following nontraumatic intracerebral hemorrhage affecting right non-dominant side: Secondary | ICD-10-CM | POA: Insufficient documentation

## 2019-08-31 DIAGNOSIS — R41841 Cognitive communication deficit: Secondary | ICD-10-CM | POA: Diagnosis present

## 2019-08-31 NOTE — Therapy (Signed)
[facility] [address] [place], [place], [address] Phone: [phone]   Fax:  [phone]  Physical Therapy Treatment  Patient Details  Name: Melanie Madden MRN: [id number] Date of Birth: 06-Oct-1965 Referring Provider (PT): Lauraine Rinne, [place]   Encounter Date: 08/31/2019  PT End of Session - 08/31/19 1240    Visit Number  2    Number of Visits  20    Date for PT Re-Evaluation  11/24/19    Authorization Type  [employer]- 60 VL combined PT/OT/ST    Authorization - Visit Number  20    PT Start Time  [id number]    PT Stop Time  1320    PT Time Calculation (min)  45 min    Equipment Utilized During Treatment  Gait belt    Activity Tolerance  Patient tolerated treatment well       Past Medical History:  Diagnosis Date  . Allergy   . Anxiety   . Arthritis   . High triglycerides   . Kidney stones   . Migraines   . Recurrent sinus infections     Past Surgical History:  Procedure Laterality Date  . ANTERIOR CRUCIATE LIGAMENT REPAIR Left   . BUNIONECTOMY Right   . LASIK    . LITHOTRIPSY    . MENISCUS REPAIR Left   . NASAL SEPTUM SURGERY    . SHOULDER SURGERY    . VARICOSE VEIN SURGERY Left     There were no vitals filed for this visit.  Subjective Assessment - 08/31/19 1239    Subjective  Pt just finished with ST. Pt was in ER on Saturday with some chest pain that they determined was anxiety related. All heart testing negative. Was prescribed ativan but they have not picked up yet.    Pertinent History  anxiety, migraines, HTN    Patient Stated Goals  Per husband report they would like her to get stronger on right, improve mobility, and work on speech.    Currently in Pain?  No/denies   does report some issues with right shoulder at times, just got injection in shoulder last week.                      [facility] Adult PT Treatment/Exercise - 08/31/19 1241      Bed Mobility   Bed Mobility  Rolling  Right;Rolling Left;Supine to Sit;Sit to Supine    Rolling Right  Supervision/verbal cueing    Rolling Left  Moderate Assistance - Patient 50-74%    Supine to Sit  Minimal Assistance - Patient > 75%;Moderate Assistance - Patient 50-74%    Sit to Supine  Minimal Assistance - Patient > 75%      Transfers   Transfers  Squat Pivot Transfers;Lateral/Scoot Transfers;Sit to Stand;Stand to Sit    Sit to Stand  3: Mod assist    Sit to Stand Details  Verbal cues for technique    Sit to Stand Details (indicate cue type and reason)  Pt was cued for hand placement on mat or on wheelchair to push up. PT blocked right knee during transfer. Pt with right AFO donned.    Stand to Sit  3: Mod assist    Stand to Sit Details (indicate cue type and reason)  Verbal cues for technique    Stand to Sit Details  Pt instructed to try to control descent but has poor eccentric control.    Squat Pivot Transfers  4: Min assist  Squat Pivot Transfer Details (indicate cue type and reason)  Pt performed squat/pivot transfer w/c to/from mat x 2 with verbal cues to scoot close to chair first and PT assisting to position right foot in safe position to transfer. PT blocked right knee.    Lateral/Scoot Transfers  4: Min assist;With [employer];With armrests removed    Lateral/Scoot Transfer Details (indicate cue type and reason)  PT assisted to place slideboard. Pt was given cues to lean right and try to pick up left leg but impulsive and just trying to start transfer. Performed w/c to Clinical research associate  Yes    Wheelchair Assistance  5: Supervision    Wheelchair Assistance Details  Verbal cues for precautions/safety    Wheelchair Propulsion  Left upper extremity;Left lower extremity    Wheelchair Parts Management  Supervision/cueing    Distance  50    Comments  Pt needed verbal cuing to avoid objects on right a couple times. Pt was able to maintain fairly straight course and turn a corner in w/c.  Pt able to lock brakes when cued to do so. Husband reports he is pushing her in home as more difficult to maneuver over thresholds. Discussed trying to find rubber cover to help smooth out wood threshold at home. Discussed letting patient propel as far as she can and assisting if gets stuck in doorways. Educated patient and husband how patient pulling on doorframe to help get through may help as well.       Therapeutic Activites    Therapeutic Activities  Other Therapeutic Activities    Other Therapeutic Activities  With rolling left in bed PT had to provide max verbal and tactile cuing to help patient to reach across and bring her right arm across body first before attempting to roll. Pt was then mod assist to roll left. Pt performed sit to supine with min assist to bring right leg onto mat with patient also lifting with left arm. To assist with scooting in bed PT had patient bridge with left leg and then assisted with min assist at pelvis to scoot to the left. Pt needed max verbal and tactile cuing to perform. Has patient perform left leg partial bridge x 5 working on scooting left with PT assisting at pelvis. Bridging over bolster with both legs with patient able to get minimal rise at right pelvis over bolster enough to clear bottom. Added for exercise to try at home. Right sidelying to sit mod assist due to patient being very close to edge and pushing up on therapist leg. Pt impulsive throughout activities needing assistance to redirect. PT discussed current bed situation at home with patient and husband. Bed so high that would need a step to get in so husband lifting her up into bed. PT suggested lowering frame or removing box spring. Husband reports that patient has been resistant to changing bed. Pt shaking head no to suggestions. Suggested bed assist rail for safety with rolling and sitting up but husband said he has tried and is too high to reach floor with rail.      Neuro Re-ed    Neuro Re-ed  Details   Pt stood at mat with HW with PT blocking right knee mod assist to maintain standing. Pt shifted heavily on to left leg. Pt sat then stood again with trying to weight shift side to side mod assist of therapist with PT blocking right knee.  Standing at //  bar for more support with PT blocking right knee and again attempted to shift weight over left. Pt with minimal left weight shift and no muscle activation noted at right knee to help. Right AFO was donned throughout.             PT Education - 08/31/19 1457    Education Details  Pt and husband instructed in supine bridge over bolster at home x 10 to try to get some right hip muscle activation. Will need to give picture next time    Person(s) Educated  Patient;Spouse    Methods  Explanation;Demonstration    Comprehension  Verbalized understanding;Returned demonstration;Need further instruction       PT Short Term Goals - 08/31/19 1459      PT SHORT TERM GOAL #1   Title  Pt will be able to perform initial HEP with assist of husband for improved function.    Time  4    Period  Weeks    Status  New    Target Date  09/25/19      PT SHORT TERM GOAL #2   Title  Pt will transfer w/c to/from mat with slideboard CGA with assist to set up slideboard for improved mobility.    Time  4    Period  Weeks    Status  New    Target Date  09/25/19      PT SHORT TERM GOAL #3   Title  Pt will be able to stand x 2 min mod assist with PT blocking right knee for improved standing balance.    Time  4    Period  Weeks    Status  New    Target Date  09/25/19      PT SHORT TERM GOAL #4   Title  Pt will perform stand/pivot transfer mod assist of husband to allow improved safety with transfers at home.    Time  4    Period  Weeks    Status  New    Target Date  09/25/19      PT SHORT TERM GOAL #5   Title  Pt will perform all bed mobility min assist or less for improved mobility.    Time  4    Period  Weeks    Status  New    Target Date   09/25/19      PT SHORT TERM GOAL #6   Title  Pt will propel w/c  100' supervision for improved mobility in home.    Time  4    Period  Weeks    Status  New    Target Date  09/25/19        PT Long Term Goals - 08/26/19 0841      PT LONG TERM GOAL #1   Title  Pt will be able to perform progressive HEP for strengthening, balance and mobility with assist of husband to continued gains at home.    Time  10    Period  Weeks    Status  New    Target Date  11/04/19      PT LONG TERM GOAL #2   Title  Pt will be able to perform all transfers min assist for improved safety with mobility.    Time  10    Period  Weeks    Status  New    Target Date  11/04/19      PT LONG TERM GOAL #3   Title  Pt  will be able to ambulate 10' with max assist for improved access to bathroom at home with husband with appropriate AD.    Time  10    Period  Weeks    Status  New    Target Date  11/04/19            Plan - 08/31/19 1320    Clinical Impression Statement  Pt was able to perform slideboad and squat/pivot transfer w/c to mat with less assistance. Pt was impulsive throughout activities needing assistance for safety. Still no active movements in right LE noted. Right knee needs blocking with standing activities. BP=160/92 at end of session. Pt nonverbal throughout session.    Personal Factors and Comorbidities  Comorbidity 3+    Comorbidities  anxiety, migraines, HTN    Examination-Activity Limitations  Bathing;Bed Mobility;Stand;Locomotion Level;Squat;Stairs;Dressing;Transfers    Examination-Participation Restrictions  Community Activity;Driving;Shop;Laundry;Meal Prep    Stability/Clinical Decision Making  Evolving/Moderate complexity    Rehab Potential  Fair    PT Frequency  2x / week    PT Duration  --   10 weeks   PT Treatment/Interventions  ADLs/Self Care Home Management;Electrical Stimulation;DME Instruction;Gait training;Stair training;Functional mobility training;Therapeutic  activities;Therapeutic exercise;Orthotic Fit/Training;Patient/family education;Neuromuscular re-education;Balance training;Manual techniques;Passive range of motion;Vestibular;Wheelchair mobility training    PT Next Visit Plan  W/c mobility around obstacles especially things on right, w/c over threshold in doorway simulation.  Transfer training, bed mobility, standing.    Consulted and Agree with Plan of Care  Patient;Family member/caregiver    Family Member Consulted  husband       Patient will benefit from skilled therapeutic intervention in order to improve the following deficits and impairments:  Abnormal gait, Decreased activity tolerance, Decreased balance, Decreased cognition, Decreased knowledge of use of DME, Decreased mobility, Decreased range of motion, Decreased safety awareness, Decreased strength, Impaired sensation  Visit Diagnosis: Muscle weakness (generalized)  Other abnormalities of gait and mobility     Problem List Patient Active Problem List   Diagnosis Date Noted  . Nontraumatic subcortical hemorrhage of left cerebral hemisphere ([facility] [place]) 08/27/2019  . Acute blood loss anemia   . Hypoalbuminemia due to protein-calorie malnutrition ([place])   . Thrombocytopenia ([place] [facility])   . Dysphagia 07/29/2019  . Infarction of left basal ganglia ([place]) 07/18/2019  . Hypernatremia   . Leukocytosis   . Essential hypertension   . Global aphasia   . Cytotoxic brain edema ([place]) 07/10/2019  . ICH (intracerebral hemorrhage) ([place]) 07/09/2019  . Anxiety state 02/21/2015  . Cephalalgia 12/21/2014    Electa Sniff, PT, DPT, NCS 08/31/2019, 3:06 PM  [facility] [address] [place], [place], [address] Phone: [phone]   Fax:  [phone]  Name: Melanie Madden MRN: [id number] Date of Birth: Jun 07, 1966

## 2019-09-01 NOTE — Therapy (Signed)
[facility] [address] [place], [place], [address] Phone: [phone]   Fax:  [phone]  Speech Language Pathology Treatment  Patient Details  Name: Melanie Madden MRN: [id number] Date of Birth: [birth year] Referring Provider (SLP): Alysia Penna, MD   Encounter Date: 08/31/2019  End of Session - 09/01/19 1123    Visit Number  2    Number of Visits  25    Date for SLP Re-Evaluation  12/04/19    Authorization Type  [employer]ign[employer]    Authorization Time Period  60 DAYS of therapy regardless of number disciplines per day    SLP Start Time  [id number]    SLP Stop Time   1231    SLP Time Calculation (min)  40 min    Activity Tolerance  Patient tolerated treatment well       Past Medical History:  Diagnosis Date  . Allergy   . Anxiety   . Arthritis   . High triglycerides   . Kidney stones   . Migraines   . Recurrent sinus infections     Past Surgical History:  Procedure Laterality Date  . ANTERIOR CRUCIATE LIGAMENT REPAIR Left   . BUNIONECTOMY Right   . LASIK    . LITHOTRIPSY    . MENISCUS REPAIR Left   . NASAL SEPTUM SURGERY    . SHOULDER SURGERY    . VARICOSE VEIN SURGERY Left     There were no vitals filed for this visit.         ADULT SLP TREATMENT - 09/01/19 0001      General Information   Behavior/Cognition  Alert;Cooperative;Distractible;Doesn't follow directions   directions due to receptive language deficits     Treatment Provided   Treatment provided  Cognitive-Linquistic      Cognitive-Linquistic Treatment   Treatment focused on  Aphasia;Apraxia    Skilled Treatment  SLP began assessment of pt reading comprehension using the Western Aphasia Battery extension testing. Testing was at a slower pace than normal due to pt difficulty with receptive language. Testing revealed pt is best with matching a single picture to f:6 written words. This is, of course, complicated by pt's impulsivity/attention  deficits. SLP described in detail verbal expression task pt husband could do with pt at home. Husband to bring SD card of family pictures next session for SLP to work on with pt for functional approximations of family names.     Assessment / Recommendations / Plan   Plan  Continue with current plan of care      Progression Toward Goals   Progression toward goals  Progressing toward goals       SLP Education - 09/01/19 1123    Education Details  verbal tasks at home    Person(s) Educated  Patient;Spouse    Methods  Explanation;Verbal cues    Comprehension  Verbalized understanding;Verbal cues required;Need further instruction       SLP Short Term Goals - 09/01/19 1129      SLP [place] #1   Title  pt will simultaneously with SLP produce 3 different phonemes 50% of the time over three sessions    Time  6    Period  Weeks    Status  On-going      SLP SHORT TERM GOAL #2   Title  pt will ID objects salient to pt in f:4 80% of the time over 3 sessions    Time  6    Period  Weeks  Status  On-going      SLP SHORT TERM GOAL #3   Title  pt will write her full name on first attempt over three sessions    Time  6    Period  Weeks    Status  On-going      SLP SHORT TERM GOAL #4   Title  pt will attempt expressive communication via multimodal means during 5 sessions    Time  6    Period  Weeks    Status  On-going      SLP SHORT TERM GOAL #5   Title  pt will demonstrate undertanding of simple 1-step functional commands with 70% accuracy with multimodal cues over 3 sessions    Time  6    Period  Weeks    Status  On-going      Additional Short Term Goals   Additional Short Term Goals  Yes      SLP SHORT TERM GOAL #6   Title  pt will look at object or line drawing and match the correct word from f:6 words 90% success over 3 sessions    Time  6    Period  Weeks    Status  New       SLP Long Term Goals - 09/01/19 1129      SLP LONG TERM GOAL #1   Title  pt's diet  will be appropriately upgraded as needed by SLP    Time  12    Period  Weeks   or 25 total visits, for all LTGs   Status  On-going      SLP LONG TERM GOAL #2   Title  pt will produce 5 simple words/words pertinent to pt (family or pet names, etc) simultaneously with SLP and multimodal cues over three sessions    Time  12    Period  Weeks    Status  On-going      SLP LONG TERM GOAL #3   Title  pt will demo understanding of simple yes/no questions pertinent to desires/wants with multimodal cues 90% of the time over 3 sessions    Time  12    Period  Weeks    Status  On-going      SLP LONG TERM GOAL #4   Title  a device trial with a speech generating device will be initiated within the first 20 ST visits    Time  12    Period  Weeks    Status  On-going      SLP LONG TERM GOAL #5   Title  pt will demo recognition of written word for a common object 100% success over 3 sessions    Time  12    Period  Weeks    Status  New       Plan - 09/01/19 1124    Clinical Impression Statement  Pt currently on dys III/thin diet - no complaints with this diet to SLP knowledge. Today SLP provided pt husband some ideas for verbal expression practice at home. Pt presents with cognitive communication deficits in at least the areas of basic attention and basic awareness, a severe/profound expressive aphasia with verbal apraxia, and severe/profound receptive aphasia ("global aphasia"). Expressive communiction today was perseverative "duh" and less common "Durrr" when pt was asked to read aloud (direction was repeated 3 ways before pt complied). Today with WAB testing of reading comprehension pt demo'd her most functional reading at the word level matching a line drawing  to one of a f:6 words. Unsure if pt can complete entire WAB due to frustration and/or ability level. At some point pt may benefit from a speech generating device. She would cont to benefit from skilled ST focusing on incr'ing communicative  effectiveness via multimodal means, incr'ing cognition in functional ways as able given pt's language deficit, and monitoring/upgrading pt's diet as appropriate.    Speech Therapy Frequency  2x / week    Duration  --   12 weeks, or 25 total ST visits   Treatment/Interventions  Diet toleration management by SLP;Trials of upgraded texture/liquids;Language facilitation;Internal/external aids;Cognitive reorganization;Cueing hierarchy;SLP instruction and feedback;Patient/family education;Compensatory strategies;Multimodal communcation approach;Functional tasks;Environmental controls    Potential to Achieve Goals  Fair    Potential Considerations  Severity of impairments    Consulted and Agree with Plan of Care  Patient;Family member/caregiver    Family Member Consulted  husband [name]       Patient will benefit from skilled therapeutic intervention in order to improve the following deficits and impairments:   Aphasia  Verbal apraxia  Cognitive communication deficit  Dysphagia, unspecified type    Problem List Patient Active Problem List   Diagnosis Date Noted  . Nontraumatic subcortical hemorrhage of left cerebral hemisphere ([facility]) 08/27/2019  . Acute blood loss anemia   . Hypoalbuminemia due to protein-calorie malnutrition ([place])   . Thrombocytopenia ([place])   . Dysphagia 07/29/2019  . Infarction of left basal ganglia ([facility]) 07/18/2019  . Hypernatremia   . Leukocytosis   . Essential hypertension   . Global aphasia   . Cytotoxic brain edema ([place]) 07/10/2019  . ICH (intracerebral hemorrhage) ([place]) 07/09/2019  . Anxiety state 02/21/2015  . Cephalalgia 12/21/2014    [place] ,[place], [place]  09/01/2019, 11:33 AM  [facility] [address] [place], [place], [address] Phone: [phone]   Fax:  [phone]   Name: Melanie Madden MRN: [id number] Date of Birth: [birth year]

## 2019-09-01 NOTE — Patient Instructions (Signed)
  Use family pictures for talking practice, write each of the names down and display them with the picture. Bring these in next session.  At home, you have her repeat them to you. Make sure she can see your face.   You may want to have pictures of rooms to narrow down what Melanie Madden is trying to communicate.

## 2019-09-02 ENCOUNTER — Ambulatory Visit: Payer: Managed Care, Other (non HMO) | Admitting: Occupational Therapy

## 2019-09-02 ENCOUNTER — Other Ambulatory Visit: Payer: Self-pay

## 2019-09-02 ENCOUNTER — Ambulatory Visit: Payer: Managed Care, Other (non HMO) | Admitting: Speech Pathology

## 2019-09-02 ENCOUNTER — Encounter: Payer: Self-pay | Admitting: Occupational Therapy

## 2019-09-02 ENCOUNTER — Encounter: Payer: Self-pay | Admitting: Speech Pathology

## 2019-09-02 DIAGNOSIS — M6281 Muscle weakness (generalized): Secondary | ICD-10-CM | POA: Diagnosis not present

## 2019-09-02 DIAGNOSIS — R482 Apraxia: Secondary | ICD-10-CM

## 2019-09-02 DIAGNOSIS — I69151 Hemiplegia and hemiparesis following nontraumatic intracerebral hemorrhage affecting right dominant side: Secondary | ICD-10-CM

## 2019-09-02 DIAGNOSIS — R4701 Aphasia: Secondary | ICD-10-CM

## 2019-09-02 DIAGNOSIS — R2689 Other abnormalities of gait and mobility: Secondary | ICD-10-CM

## 2019-09-02 DIAGNOSIS — I69219 Unspecified symptoms and signs involving cognitive functions following other nontraumatic intracranial hemorrhage: Secondary | ICD-10-CM

## 2019-09-02 DIAGNOSIS — R41841 Cognitive communication deficit: Secondary | ICD-10-CM

## 2019-09-02 DIAGNOSIS — R208 Other disturbances of skin sensation: Secondary | ICD-10-CM

## 2019-09-02 NOTE — Therapy (Signed)
[facility] [address] [place], [place], [address] Phone: [phone]   Fax:  [phone]  Speech Language Pathology Treatment  Patient Details  Name: Melanie Madden MRN: [id number] Date of Birth: 25-Feb-1966 Referring Provider (SLP): Alysia Penna, MD   Encounter Date: 09/02/2019  End of Session - 09/02/19 1214    Visit Number  3    Number of Visits  25    Date for SLP Re-Evaluation  12/04/19    Authorization Time Period  60 DAYS of therapy regardless of number disciplines per day    SLP Start Time  1115    SLP Stop Time   1158    SLP Time Calculation (min)  43 min    Activity Tolerance  Patient tolerated treatment well       Past Medical History:  Diagnosis Date  . Allergy   . Anxiety   . Arthritis   . High triglycerides   . Kidney stones   . Migraines   . Recurrent sinus infections     Past Surgical History:  Procedure Laterality Date  . ANTERIOR CRUCIATE LIGAMENT REPAIR Left   . BUNIONECTOMY Right   . LASIK    . LITHOTRIPSY    . MENISCUS REPAIR Left   . NASAL SEPTUM SURGERY    . SHOULDER SURGERY    . VARICOSE VEIN SURGERY Left     There were no vitals filed for this visit.  Subjective Assessment - 09/02/19 1204    Subjective  "Here is the SD card"    Patient is accompained by:  Family member   spouse Melanie Madden   Currently in Pain?  No/denies            ADULT SLP TREATMENT - 09/02/19 1204      General Information   Behavior/Cognition  Alert;Cooperative;Distractible;Doesn't follow directions      Treatment Provided   Treatment provided  Cognitive-Linquistic      Cognitive-Linquistic Treatment   Treatment focused on  Aphasia;Apraxia;Patient/family/caregiver education    Skilled Treatment  Downloaded select images of family and friends to target receptive language. Pt ID'd name to photo f:3 with usual mod A. Spouse trained in practicing with this at home, adding names or words for  choices depending on how accurately she responds. Initiated training spouse to use written words for choices at home F: 2 or 3 to start. Consistent max A for vowel production with visual placement cues and in unison.  Some confusion as to whether Melanie Madden needed to use the restroom. Attempted to train gesture for restroom. Pt impulsive and with UE apraxia, she imitated gesture 3x. Encouraged spouse to practice a signal or gesture for restroom to improve accuracy of this communciatoin.      Assessment / Recommendations / Plan   Plan  Continue with current plan of care      Progression Toward Goals   Progression toward goals  Progressing toward goals       SLP Education - 09/02/19 1211    Education Details  written cues for home practcie; specific gesture for bathroom    Person(s) Educated  Patient;Spouse    Methods  Explanation;Demonstration;Verbal cues;Handout    Comprehension  Verbalized understanding;Verbal cues required;Need further instruction       SLP Short Term Goals - 09/02/19 1214      SLP SHORT TERM GOAL #1   Title  pt will simultaneously with SLP produce 3 different phonemes 50% of the time over three sessions  Time  6    Period  Weeks    Status  On-going      SLP SHORT TERM GOAL #2   Title  pt will ID objects salient to pt in f:4 80% of the time over 3 sessions    Time  6    Period  Weeks    Status  On-going      SLP SHORT TERM GOAL #3   Title  pt will write her full name on first attempt over three sessions    Time  6    Period  Weeks    Status  On-going      SLP SHORT TERM GOAL #4   Title  pt will attempt expressive communication via multimodal means during 5 sessions    Time  6    Period  Weeks    Status  On-going      SLP SHORT TERM GOAL #5   Title  pt will demonstrate undertanding of simple 1-step functional commands with 70% accuracy with multimodal cues over 3 sessions    Time  6    Period  Weeks    Status  On-going      SLP SHORT TERM GOAL #6    Title  pt will look at object or line drawing and match the correct word from f:6 words 90% success over 3 sessions    Time  6    Period  Weeks    Status  New       SLP Long Term Goals - 09/02/19 1214      SLP LONG TERM GOAL #1   Title  pt's diet will be appropriately upgraded as needed by SLP    Time  12    Period  Weeks   or 25 total visits, for all LTGs   Status  On-going      SLP LONG TERM GOAL #2   Title  pt will produce 5 simple words/words pertinent to pt (family or pet names, etc) simultaneously with SLP and multimodal cues over three sessions    Time  12    Period  Weeks    Status  On-going      SLP LONG TERM GOAL #3   Title  pt will demo understanding of simple yes/no questions pertinent to desires/wants with multimodal cues 90% of the time over 3 sessions    Time  12    Period  Weeks    Status  On-going      SLP LONG TERM GOAL #4   Title  a device trial with a speech generating device will be initiated within the first 20 ST visits    Time  12    Period  Weeks    Status  On-going      SLP LONG TERM GOAL #5   Title  pt will demo recognition of written word for a common object 100% success over 3 sessions    Time  12    Period  Weeks    Status  New       Plan - 09/02/19 1211    Clinical Impression Statement  Pt currently on dys III/thin diet - no complaints with this diet to SLP knowledge. Today SLP provided pt husband some ideas for using writing at word level to communicate choices with Melanie Madden to practice at home. Pt presents with cognitive communication deficits in at least the areas of basic attention and basic awareness, a severe/profound expressive aphasia with verbal apraxia,  and severe/profound receptive aphasia ("global aphasia"). Expressive communiction today was perseverative "duh" and less common "Durrr" when pt asked to imitated vowels.Marland Kitchen Unsure if pt can complete entire WAB due to frustration and/or ability level. At some point pt may benefit from a  speech generating device. She would cont to benefit from skilled ST focusing on incr'ing communicative effectiveness via multimodal means, incr'ing cognition in functional ways as able given pt's language deficit, and monitoring/upgrading pt's diet as appropriate.    Speech Therapy Frequency  2x / week    Duration  --   12 weeks or 25 visits      Patient will benefit from skilled therapeutic intervention in order to improve the following deficits and impairments:   Aphasia  Verbal apraxia  Cognitive communication deficit    Problem List Patient Active Problem List   Diagnosis Date Noted  . Nontraumatic subcortical hemorrhage of left cerebral hemisphere ([place]) 08/27/2019  . Acute blood loss anemia   . Hypoalbuminemia due to protein-calorie malnutrition ([place])   . Thrombocytopenia ([place])   . Dysphagia 07/29/2019  . Infarction of left basal ganglia ([place]) 07/18/2019  . Hypernatremia   . Leukocytosis   . Essential hypertension   . Global aphasia   . Cytotoxic brain edema ([place]) 07/10/2019  . ICH (intracerebral hemorrhage) ([place]) 07/09/2019  . Anxiety state 02/21/2015  . Cephalalgia 12/21/2014    Aliene Tamura, Annye Rusk MS, CCC-SLP 09/02/2019, 12:15 PM  [facility] [address] [place], [place], [address] Phone: [phone]   Fax:  [phone]   Name: Melanie Madden MRN: [id number] Date of Birth: 08-05-1965

## 2019-09-02 NOTE — Patient Instructions (Signed)
    Talk Path App by Adella Hare  Show her a photo and write 2-3 names or places and have her choose  Write down 2-3 choices and see if she can select one   At meals, write down 2-3 things she is eating

## 2019-09-02 NOTE — Therapy (Addendum)
[facility] [address] [place], [place], [address] Phone: [phone]   Fax:  [phone]  Occupational Therapy Evaluation  Patient Details  Name: Melanie Madden MRN: [id number] Date of Birth: 18-Jul-1966 Referring Provider (OT): Dr. Alysia Penna   Encounter Date: 09/02/2019  OT End of Session - 09/02/19 1145    Visit Number  1    Number of Visits  25    Date for OT Re-Evaluation  12/01/19    Authorization Type  [employer] 2021:  No prior auth OT, 60 DAY visit limit (regardless of how many therapies each day).    Authorization - Visit Number  4    Authorization - Number of Visits  72    OT Start Time  1024    OT Stop Time  1102    OT Time Calculation (min)  38 min    Activity Tolerance  Patient tolerated treatment well    Behavior During Therapy  Impulsive   pt frustrated during session at times      Past Medical History:  Diagnosis Date  . Allergy   . Anxiety   . Arthritis   . High triglycerides   . Kidney stones   . Migraines   . Recurrent sinus infections     Past Surgical History:  Procedure Laterality Date  . ANTERIOR CRUCIATE LIGAMENT REPAIR Left   . BUNIONECTOMY Right   . LASIK    . LITHOTRIPSY    . MENISCUS REPAIR Left   . NASAL SEPTUM SURGERY    . SHOULDER SURGERY    . VARICOSE VEIN SURGERY Left     There were no vitals filed for this visit.  Subjective Assessment - 09/02/19 1025    Subjective   pt aphasic-- nods head yes/no inconsistently and tried to gesture    Patient is accompanied by:  Family member   husband   Pertinent History  Subcortical hemorrhage of R cerbral hemisphere.  PMH:  HTN, anxiety, migraines, hx of shoulder injection approx 2 weeks ago for R shoulder pain, hx of R shoulder surgery (arthroscopic)    Limitations  aphasia, fall risk, impulsive    Patient Stated Goals  husband reports: to be able to care for herself more (pt indicates that she onces to return to previous  activities per questioning/gestures)    Currently in Pain?  No/denies        [facility] OT Assessment - 09/02/19 0001      Assessment   Medical Diagnosis  left basal ganglia ICH    Referring Provider (OT)  Dr. 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), walk    Leisure  go to outdoor fairs, parks, golf, walk outside, tennis   spoke [place] (first), [place], [place], [place] prior  ADL   Eating/Feeding  Needs assist with cutting food   eating with L hand, assist for opening bottles   Grooming  Set up   for brushing teeth; dependent for hair   Upper Body Bathing  Maximal assistance    Lower Body Bathing  --   dependent   Upper Body Dressing  Moderate assistance    Lower Body Dressing  Maximal assistance    Toilet Transfer  Maximal assistance    Toilet Transer Equipment  Grab bars    Toileting - Clothing Manipulation  Maximal assistance   unable to get w/c in bathroom   Toileting -  Hygiene  --   max A for bowel movement, min A after urination   Tub/Shower Transfer  Maximal assistance    Tub/Shower Transfer Equipment  Transfer tub bench;Grab bars   long handled shower head   Transfers/Ambulation Related to ADL's  max A      IADL   Shopping  Completely unable to shop    Prior Level of Function Light Housekeeping  Pt performed most prior.  now dependent    Prior Level of Function Meal Prep  pt performed most cooking prior,  dependent now    [employer] on family or friends for transportation    Medication Management  Is not capable of dispensing or managing own medication    Financial Management  Dependent      Mobility   Mobility Status  Needs assist    Mobility Status Comments  nonambulatory, unable to sit on edge of bed unsupported due to impulsivity and decr trunk control.  (See PT notes for details)      Vision Assessment   Vision Assessment  --   difficult to determine due to aphasia   Comment  Pt indicates difficulty/change in vision (unable to describe due to aphasia).  Pt able to scan for matching colored blocks on table top (simple scanning) but impulsivity noted.  PT notes min cueing needed for R side when propelling w/c.  Husband reports no overt visual inattention, but difficult to determine due to severity of sensation, language, and cognitive deficits.  Will assess further in functional context.        Cognition   Overall Cognitive Status  Impaired/Different from baseline    Area of Impairment  Safety/judgement    Difficult to assess due to  Impaired communication    Safety/Judgement  Decreased awareness of safety   impulsive   Awareness  Impaired    Awareness Impairment  Intellectual impairment   difficult to determine extent due to aphasia   Behaviors  Impulsive;Perseveration;Poor frustration tolerance      Posture/Postural Control   Posture/Postural Control  --      Sensation   Light Touch  Impaired by gross assessment    Additional Comments  Difficult to formally assess as patient nonverbal and unsure if answering yes/no questions correctly with head nods but appeared not to have light touch through right UE and LEs      Coordination   Gross Motor Movements are Fluid and Coordinated  No    Fine Motor Movements are Fluid and Coordinated  No    Coordination  No AROM RUE, flacid      Tone   Assessment Location  Right Upper Extremity      ROM / Strength   AROM /  PROM / Strength  AROM;PROM      AROM   Overall AROM   Deficits  Overall AROM Comments  No active ROM RUE, flacid (PT notes also indicate no AROM RLE/flacid)      PROM   Overall PROM   Deficits    Overall PROM Comments  RUE:  PROM grossly WNL with mild tightness noted with end range movement (elbow, fingers, wrist), but only approx 90* R shoulder flex/abduction (50% ER) limited by pain/stiffness (pt indicated by gesture)      Hand Function   Right Hand Gross Grasp  Impaired      RUE Tone   RUE Tone  Flaccid                      OT Education - 09/02/19 1521    Education Details  OT POC    Person(s) Educated  Patient;Spouse    Methods  Explanation    Comprehension  Verbalized understanding       OT Short Term Goals - 09/02/19 1219      OT SHORT TERM GOAL #1   Title  Pt/husband will be independent with initial HEP for RUE ROM--check STGs 10/14/19    Time  6    Period  Weeks    Status  New      OT SHORT TERM GOAL #2   Title  Pt will perform UB dressing with min A.    Time  6    Period  Weeks    Status  New      OT SHORT TERM GOAL #3   Title  Pt will perform UB bathing with mod A.    Time  6    Period  Weeks    Status  New      OT SHORT TERM GOAL #4   Title  Pt will be able to brush hair with set-up.    Time  6    Period  Weeks    Status  New      OT SHORT TERM GOAL #5   Title  Pt will demo at least 110* shoulder flex PROM in supine without pain for incr ease for ADLs.    Time  6    Period  Weeks    Status  New        OT Long Term Goals - 09/02/19 1229      OT LONG TERM GOAL #1   Title  Pt/husband will be independent with HEP for functional activities for incr participation in IADLs/leisure tasks and for cognition (and incorporating RUE if able).--check LTGs 12/01/19    Time  12    Period  Weeks    Status  New      OT LONG TERM GOAL #2   Title  Pt will perform UB dressing with supervision.    Time  12    Period  Weeks    Status  New       OT LONG TERM GOAL #3   Title  Pt will perform LB dressing with mod A.    Time  12    Period  Weeks    Status  New      OT LONG TERM GOAL #4   Title  Pt will perform UB bathing with min A.    Time  12    Period  Weeks    Status  New      OT LONG TERM GOAL #5   Title  Pt will perform simple cold snack prep from w/c level with min A/set up.  Time  12    Period  Weeks    Status  New      OT LONG TERM GOAL #6   Title  Pt will perform toileting with mod A.    Time  12    Period  Weeks    Status  New            Plan - 09/02/19 1149    Clinical Impression Statement  Pt is a 54 y.o. female s/p subcortical hemorrhage of R cerebral hemisphere.  Pt was hospitalized 07/08/20-08/21/19.  Pt with PMH:  that includes HTN, anxiety, migraines, hx of R shoulder surgery.  Pt presents today with flacid R hemiplegia throughout RUE/RLE with inability to use RUE functionally,decr sensation, cognitive deficits (as well as significant aphasia), decr trunk control, and is nonambulatory at this time.  Pt would benefit from occupational therapy to address these deficits for incr participation/independence with ADLs/IADLs.    OT Occupational Profile and History  Detailed Assessment- Review of Records and additional review of physical, cognitive, psychosocial history related to current functional performance    Occupational performance deficits (Please refer to evaluation for details):  ADL's;IADL's;Leisure;Work;Social Participation    Body Structure / Function / Physical Skills  ADL;ROM;IADL;Sensation;Mobility;Strength;Tone;UE functional use;Decreased knowledge of precautions;Decreased knowledge of use of DME;Coordination;Balance;FMC;GMC    Cognitive Skills  Attention;Temperament/Personality;Understand;Thought;Safety Awareness    Rehab Potential  Good    Clinical Decision Making  Multiple treatment options, significant modification of task necessary    Comorbidities Affecting Occupational Performance:   May have comorbidities impacting occupational performance    Modification or Assistance to Complete Evaluation   Min-Moderate modification of tasks or assist with assess necessary to complete eval    OT Frequency  2x / week    OT Duration  12 weeks   +eval   OT Treatment/Interventions  Self-care/ADL training;Moist Heat;DME and/or AE instruction;Splinting;Therapeutic activities;Aquatic Therapy;Cognitive remediation/compensation;Therapeutic exercise;Cryotherapy;Neuromuscular education;Functional Mobility Training;Passive range of motion;Visual/perceptual remediation/compensation;Patient/family education;Manual Therapy;Electrical Stimulation;Fluidtherapy    Plan  initiate HEP (self ROM), ADL strategies    Consulted and Agree with Plan of Care  Patient;Family member/caregiver    Family Member Consulted  husband       Patient will benefit from skilled therapeutic intervention in order to improve the following deficits and impairments:   Body Structure / Function / Physical Skills: ADL, ROM, IADL, Sensation, Mobility, Strength, Tone, UE functional use, Decreased knowledge of precautions, Decreased knowledge of use of DME, Coordination, Balance, FMC, GMC Cognitive Skills: Attention, Temperament/Personality, Understand, Thought, Safety Awareness     Visit Diagnosis: Hemiplegia and hemiparesis following nontraumatic intracerebral hemorrhage affecting right dominant side ([place]) - Plan: Ot plan of care cert/re-cert, CANCELED: Ot plan of care cert/re-cert  Unspecified symptoms and signs involving cognitive functions following other nontraumatic intracranial hemorrhage - Plan: Ot plan of care cert/re-cert, CANCELED: Ot plan of care cert/re-cert  Other abnormalities of gait and mobility - Plan: Ot plan of care cert/re-cert, CANCELED: Ot plan of care cert/re-cert  Other disturbances of skin sensation - Plan: Ot plan of care cert/re-cert    Problem List Patient Active Problem List   Diagnosis Date  Noted  . Nontraumatic subcortical hemorrhage of left cerebral hemisphere ([place]) 08/27/2019  . Acute blood loss anemia   . Hypoalbuminemia due to protein-calorie malnutrition ([place])   . Thrombocytopenia ([place])   . Dysphagia 07/29/2019  . Infarction of left basal ganglia ([place]) 07/18/2019  . Hypernatremia   . Leukocytosis   . Essential hypertension   . Global aphasia   .  Cytotoxic brain edema ([place]) 07/10/2019  . ICH (intracerebral hemorrhage) ([place]) 07/09/2019  . Anxiety state 02/21/2015  . Cephalalgia 12/21/2014    [facility] 09/02/2019, 3:29 PM  [facility] [address] [place], [place], [address] Phone: [phone]   Fax:  [phone]  Name: Melanie Madden MRN: [id number] Date of Birth: 09-Feb-1966   Vianne Bulls, OTR/L [facility] [address] [place], [place]  [address] [phone] phone [phone] 09/02/19 3:29 PM

## 2019-09-02 NOTE — Addendum Note (Signed)
Addended by: Vianne Bulls D on: 09/02/2019 03:29 PM   Modules accepted: Orders

## 2019-09-09 ENCOUNTER — Ambulatory Visit: Payer: Managed Care, Other (non HMO) | Admitting: Physical Therapy

## 2019-09-09 ENCOUNTER — Ambulatory Visit: Payer: Managed Care, Other (non HMO)

## 2019-09-09 ENCOUNTER — Encounter: Payer: Self-pay | Admitting: Physical Therapy

## 2019-09-09 ENCOUNTER — Ambulatory Visit: Payer: Managed Care, Other (non HMO) | Admitting: Occupational Therapy

## 2019-09-09 ENCOUNTER — Other Ambulatory Visit: Payer: Self-pay

## 2019-09-09 DIAGNOSIS — R41841 Cognitive communication deficit: Secondary | ICD-10-CM

## 2019-09-09 DIAGNOSIS — R208 Other disturbances of skin sensation: Secondary | ICD-10-CM

## 2019-09-09 DIAGNOSIS — R2689 Other abnormalities of gait and mobility: Secondary | ICD-10-CM

## 2019-09-09 DIAGNOSIS — R482 Apraxia: Secondary | ICD-10-CM

## 2019-09-09 DIAGNOSIS — I69151 Hemiplegia and hemiparesis following nontraumatic intracerebral hemorrhage affecting right dominant side: Secondary | ICD-10-CM

## 2019-09-09 DIAGNOSIS — M6281 Muscle weakness (generalized): Secondary | ICD-10-CM

## 2019-09-09 DIAGNOSIS — I69219 Unspecified symptoms and signs involving cognitive functions following other nontraumatic intracranial hemorrhage: Secondary | ICD-10-CM

## 2019-09-09 DIAGNOSIS — R4701 Aphasia: Secondary | ICD-10-CM

## 2019-09-09 DIAGNOSIS — R1310 Dysphagia, unspecified: Secondary | ICD-10-CM

## 2019-09-09 NOTE — Therapy (Signed)
[facility] [address] [place], [place], [address] Phone: [phone]   Fax:  [phone]  Occupational Therapy Treatment  Patient Details  Name: Melanie Madden MRN: [id number] Date of Birth: February 07, 1966 Referring Provider (OT): Dr. Alysia Penna   Encounter Date: 09/09/2019  OT End of Session - 09/09/19 1413    Visit Number  2    Number of Visits  25    Date for OT Re-Evaluation  12/01/19    Authorization Type  [employer] 2021:  No prior auth OT, 60 DAY visit limit (regardless of how many therapies each day).    Authorization - Visit Number  5    Authorization - Number of Visits  60    Activity Tolerance  Patient tolerated treatment well    Behavior During Therapy  Impulsive   also frustrated during session d/t inability to communicate      Past Medical History:  Diagnosis Date  . Allergy   . Anxiety   . Arthritis   . High triglycerides   . Kidney stones   . Migraines   . Recurrent sinus infections     Past Surgical History:  Procedure Laterality Date  . ANTERIOR CRUCIATE LIGAMENT REPAIR Left   . BUNIONECTOMY Right   . LASIK    . LITHOTRIPSY    . MENISCUS REPAIR Left   . NASAL SEPTUM SURGERY    . SHOULDER SURGERY    . VARICOSE VEIN SURGERY Left     There were no vitals filed for this visit.  Subjective Assessment - 09/09/19 1323    Subjective   pt aphasic-- nods head yes/no inconsistently and tried to gesture    Pertinent History  Subcortical hemorrhage of R cerbral hemisphere.  PMH:  HTN, anxiety, migraines, hx of shoulder injection approx 2 weeks ago for R shoulder pain, hx of R shoulder surgery (arthroscopic)    Limitations  aphasia, fall risk, impulsive    Patient Stated Goals  husband reports: to be able to care for herself more (pt indicates that she onces to return to previous activities per questioning/gestures)    Currently in Pain?  Yes    Pain Score  --   Unable to rate d/t aphasia   Pain  Location  Arm    Pain Orientation  Right    Pain Type  Acute pain    Pain Frequency  Intermittent    Aggravating Factors   unknown    Pain Relieving Factors  unknown       Initiated HEP for RUE - instructed pt's husband and friend that she would need supervision to assist for all ex's for safety and to ensure proper positioning d/t severity of flaccid hemiplegia and poor body awareness, loss of sensation including proprioception, and impulsivity. Pt required cues to go slow and do correctly.   Encouraged family to have pt correct Rt arm and leg positioning w/ Lt hand over hand/leg assist to increase body awareness and positioning vs. Just doing for her (however she may need cues to initiate). Also encouraged pt's husband/friend to set up functional tasks to encourage visual scanning to Rt including targets for table slides.   Pt impulsive especially w/ transfers and often sits prior to correcting leg position or fully pivoting safely to chair.                     OT Education - 09/09/19 1400    Education Details  Initial HEP, proper positioning, ways  to encourage scanning and body awareness to Rt side    Person(s) Educated  Patient;Spouse   AND friend   Methods  Explanation;Demonstration   demo cues   Comprehension  Verbalized understanding;Returned demonstration;Need further instruction;Verbal cues required   simple v.c's and demo cues for pt      OT Short Term Goals - 09/02/19 1219      OT SHORT TERM GOAL #1   Title  Pt/husband will be independent with initial HEP for RUE ROM--check STGs 10/14/19    Time  6    Period  Weeks    Status  New      OT SHORT TERM GOAL #2   Title  Pt will perform UB dressing with min A.    Time  6    Period  Weeks    Status  New      OT SHORT TERM GOAL #3   Title  Pt will perform UB bathing with mod A.    Time  6    Period  Weeks    Status  New      OT SHORT TERM GOAL #4   Title  Pt will be able to brush hair with set-up.     Time  6    Period  Weeks    Status  New      OT SHORT TERM GOAL #5   Title  Pt will demo at least 110* shoulder flex PROM in supine without pain for incr ease for ADLs.    Time  6    Period  Weeks    Status  New        OT Long Term Goals - 09/02/19 1229      OT LONG TERM GOAL #1   Title  Pt/husband will be independent with HEP for functional activities for incr participation in IADLs/leisure tasks and for cognition (and incorporating RUE if able).--check LTGs 12/01/19    Time  12    Period  Weeks    Status  New      OT LONG TERM GOAL #2   Title  Pt will perform UB dressing with supervision.    Time  12    Period  Weeks    Status  New      OT LONG TERM GOAL #3   Title  Pt will perform LB dressing with mod A.    Time  12    Period  Weeks    Status  New      OT LONG TERM GOAL #4   Title  Pt will perform UB bathing with min A.    Time  12    Period  Weeks    Status  New      OT LONG TERM GOAL #5   Title  Pt will perform simple cold snack prep from w/c level with min A/set up.    Time  12    Period  Weeks    Status  New      OT LONG TERM GOAL #6   Title  Pt will perform toileting with mod A.    Time  12    Period  Weeks    Status  New            Plan - 09/09/19 1413    Clinical Impression Statement  Pt w/ densely flaccid hemiplegia Rt dominant side. Pt also w/ poor body awareness, impulsive, and abcent proprioception Rt side; as well as aphasic. Pt does  have supportive husband.    Occupational performance deficits (Please refer to evaluation for details):  ADL's;IADL's;Leisure;Work;Social Participation    Body Structure / Function / Physical Skills  ADL;ROM;IADL;Sensation;Mobility;Strength;Tone;UE functional use;Decreased knowledge of precautions;Decreased knowledge of use of DME;Coordination;Balance;FMC;GMC    Cognitive Skills  Attention;Temperament/Personality;Understand;Thought;Safety Awareness    Rehab Potential  Good    OT Frequency  2x / week    OT  Duration  12 weeks    OT Treatment/Interventions  Self-care/ADL training;Moist Heat;DME and/or AE instruction;Splinting;Therapeutic activities;Aquatic Therapy;Cognitive remediation/compensation;Therapeutic exercise;Cryotherapy;Neuromuscular education;Functional Mobility Training;Passive range of motion;Visual/perceptual remediation/compensation;Patient/family education;Manual Therapy;Electrical Stimulation;Fluidtherapy    Plan  review HEP prn, continue to address body awareness, scanning to Rt, and light wt bearing RUE w/ assist, issue D-ring brace for Rt wrist support    Consulted and Agree with Plan of Care  Patient;Family member/caregiver    Family Member Consulted  husband       Patient will benefit from skilled therapeutic intervention in order to improve the following deficits and impairments:   Body Structure / Function / Physical Skills: ADL, ROM, IADL, Sensation, Mobility, Strength, Tone, UE functional use, Decreased knowledge of precautions, Decreased knowledge of use of DME, Coordination, Balance, FMC, GMC Cognitive Skills: Attention, Temperament/Personality, Understand, Thought, Safety Awareness     Visit Diagnosis: Hemiplegia and hemiparesis following nontraumatic intracerebral hemorrhage affecting right dominant side (HCC)  Other disturbances of skin sensation  Unspecified symptoms and signs involving cognitive functions following other nontraumatic intracranial hemorrhage    Problem List Patient Active Problem List   Diagnosis Date Noted  . Nontraumatic subcortical hemorrhage of left cerebral hemisphere ([place]) 08/27/2019  . Acute blood loss anemia   . Hypoalbuminemia due to protein-calorie malnutrition ([place])   . Thrombocytopenia ([place])   . Dysphagia 07/29/2019  . Infarction of left basal ganglia ([place]) 07/18/2019  . Hypernatremia   . Leukocytosis   . Essential hypertension   . Global aphasia   . Cytotoxic brain edema ([place]) 07/10/2019  . ICH (intracerebral hemorrhage)  ([place]) 07/09/2019  . Anxiety state 02/21/2015  . Cephalalgia 12/21/2014    Carey Bullocks, OTR/L 09/09/2019, 2:17 PM  [facility] [address] [place], [place], [address] Phone: [phone]   Fax:  [phone]  Name: Cheresa Stayton MRN: [id number] Date of Birth: 28-Jun-1966

## 2019-09-09 NOTE — Patient Instructions (Signed)
  ALL BELOW EXERCISES NEED SUPERVISION TO SOME ASSISTANCE FOR SAFETY AND PROPER TECHNIQUE  Flexion (Assistive)    Hold Rt wrist w/ Lt hand (thumb side up) and raise arms above head SLOWLY, keeping elbows as straight as possible. Can be done sitting or lying. Repeat __10__ times. Do _3___ sessions per day.   SHOULDER: Flexion On Table    Place hands (Lt hand over Rt wrist to guide Rt arm) on table, elbows bent. Straighten elbows and slide arms gently forward on table (hand towel underneath hands). Press hands down into table. Hold _5__ seconds. _10__ reps per set, _3__ sets per day.   SITTING: Reach Across Body    Weight bear on right hand. Reach across body with other hand to reach target (caregiver's shoulder). _5-10__ reps per set, _3__ sets per day Do slowly w/ support to Rt hand provided by caregiver. Try and get Melanie Madden to push back up to center using Rt arm

## 2019-09-09 NOTE — Therapy (Signed)
[facility] [address] [place], [place], [address] Phone: [phone]   Fax:  [phone]  Physical Therapy Treatment  Patient Details  Name: Melanie Madden MRN: [id number] Date of Birth: [birth year] Referring Provider (PT): Lauraine Rinne, [place]   Encounter Date: 09/09/2019  PT End of Session - 09/09/19 2153    Visit Number  3    Number of Visits  20    Date for PT Re-Evaluation  11/24/19    Authorization Type  [employer]- 60 VL combined PT/OT/ST    Authorization - Visit Number  3    Authorization - Number of Visits  20    PT Start Time  1230    PT Stop Time  1315    PT Time Calculation (min)  45 min    Activity Tolerance  Patient tolerated treatment well       Past Medical History:  Diagnosis Date  . Allergy   . Anxiety   . Arthritis   . High triglycerides   . Kidney stones   . Migraines   . Recurrent sinus infections     Past Surgical History:  Procedure Laterality Date  . ANTERIOR CRUCIATE LIGAMENT REPAIR Left   . BUNIONECTOMY Right   . LASIK    . LITHOTRIPSY    . MENISCUS REPAIR Left   . NASAL SEPTUM SURGERY    . SHOULDER SURGERY    . VARICOSE VEIN SURGERY Left     There were no vitals filed for this visit.  Subjective Assessment - 09/09/19 1241    Subjective  No further chest pain; husband reports pt is using the regular walker at home more.  He helps hold her hand on it and helps move her RLE.    Pertinent History  anxiety, migraines, HTN    Patient Stated Goals  Per husband report they would like her to get stronger on right, improve mobility, and work on speech.    Currently in Pain?  No/denies                       [facility] Adult PT Treatment/Exercise - 09/09/19 2128      Transfers   Transfers  Sit to Stand;Stand to Sit;Stand Pivot Transfers    Sit to Stand  3: Mod assist    Sit to Stand Details (indicate cue type and reason)  therapist facilitated anterior translation of R  femur and extension of RLE during sit >stand;  PT noted activation of glutes on R side during sit > stand intermittently    Stand to Sit  4: Min assist    Stand Pivot Transfers  2: Max assist    Stand Pivot Transfer Details (indicate cue type and reason)  Husband reports using hemi walker at home; assessed pt's safety with performing stand pivot with LUE on hemi walker.  Once standing pt required max A to maintain upright trunk and support of RUE and assistance to advance and stabilize RLE      Ambulation/Gait   Ambulation/Gait  Yes    Ambulation/Gait Assistance  2: Max assist    Ambulation/Gait Assistance Details  With RUE supported on SW with hand orthosis - carryover of NMR with therapist facilitating lateral weight shifts, use of ball in front of R foot to facilitate activation of R swing phase and cues during R stance for full WB, stance time and upright trunk to allow full step length on LLE - husband assisted with lifting and  advancing SW    Ambulation Distance (Feet)  10 Feet    Assistive device  Standard walker    Gait Pattern  Step-to pattern;Decreased step length - left;Decreased stance time - right;Decreased hip/knee flexion - right;Decreased weight shift to right;Lateral hip instability    Ambulation Surface  Level;Indoor    Gait Comments  Discussed with pt, husband and friend rationale for changing from hemi walker to walker with bilat UE support during this session to provide pt with increaesd proprioceptive input to RUE, to provide stability through UE for increased trunk and postural control and to bring trunk back into midline.  Husband and friend asking if they should purchase a walker for home.  Discussed that pt was not ready to begin walking at home with walker yet and needed to continue to practice in therapy until safe to perform at home with husband.  Also recommending RW over SW and will need to provide pt with hand orthosis      Neuro Re-ed    Neuro Re-ed Details   NMR  returned to standing with hemiwalker and placing R foot on ball attempting to have pt initiate R knee extension and flexion - unable.  Returned R foot to floor and attempted to have pt initiate spontaneous activation of RLE to kick ball in front of R foot; also attempted to facilitate weight shift and WB through RLE while having LLE kick ball.  With Hemiwalker pt unable to maintain upright trunk and postural control and demonstrated increased use of trunk extension to kick with RLE.  When attempting to WB through RLE pt performed impulsively with heavy reliance on UE support on hemiwalker.  Changed to standard walker with RUE supported in hand orthosis; therapist facilitated upright trunk in midline, more effective weight shift to LLE allowing pt to spontaneously advance RLE and kick ball; progressed to ambulation with standard walker             PT Education - 09/09/19 2151    Education Details  use of walker vs. hemi walker; pt not safe to perform walking at home with RW with husband at this time but will continue to address    Person(s) Educated  Patient    Methods  Explanation    Comprehension  Need further instruction       PT Short Term Goals - 08/31/19 1459      PT SHORT TERM GOAL #1   Title  Pt will be able to perform initial HEP with assist of husband for improved function.    Time  4    Period  Weeks    Status  New    Target Date  09/25/19      PT SHORT TERM GOAL #2   Title  Pt will transfer w/c to/from mat with slideboard CGA with assist to set up slideboard for improved mobility.    Time  4    Period  Weeks    Status  New    Target Date  09/25/19      PT SHORT TERM GOAL #3   Title  Pt will be able to stand x 2 min mod assist with PT blocking right knee for improved standing balance.    Time  4    Period  Weeks    Status  New    Target Date  09/25/19      PT SHORT TERM GOAL #4   Title  Pt will perform stand/pivot transfer mod assist of husband to  allow improved  safety with transfers at home.    Time  4    Period  Weeks    Status  New    Target Date  09/25/19      PT SHORT TERM GOAL #5   Title  Pt will perform all bed mobility min assist or less for improved mobility.    Time  4    Period  Weeks    Status  New    Target Date  09/25/19      PT SHORT TERM GOAL #6   Title  Pt will propel w/c  100' supervision for improved mobility in home.    Time  4    Period  Weeks    Status  New    Target Date  09/25/19        PT Long Term Goals - 08/26/19 0841      PT LONG TERM GOAL #1   Title  Pt will be able to perform progressive HEP for strengthening, balance and mobility with assist of husband to continued gains at home.    Time  10    Period  Weeks    Status  New    Target Date  11/04/19      PT LONG TERM GOAL #2   Title  Pt will be able to perform all transfers min assist for improved safety with mobility.    Time  10    Period  Weeks    Status  New    Target Date  11/04/19      PT LONG TERM GOAL #3   Title  Pt will be able to ambulate 10' with max assist for improved access to bathroom at home with husband with appropriate AD.    Time  10    Period  Weeks    Status  New    Target Date  11/04/19            Plan - 09/09/19 2154    Clinical Impression Statement  With bilat UE support to provide pt with improved trunk/postural control pt able to demonstrate active RLE advancement today and improved weight shift and weight acceptance on RLE; glute activation also noted on RLE during sit > stand.  Will continue to practice transfers and gait with RW with R hand orthosis to improve safety and progress to functional transfers and gait at home.    Personal Factors and Comorbidities  Comorbidity 3+    Comorbidities  anxiety, migraines, HTN    Examination-Activity Limitations  Bathing;Bed Mobility;Stand;Locomotion Level;Squat;Stairs;Dressing;Transfers    Examination-Participation Restrictions  Community  Activity;Driving;Shop;Laundry;Meal Prep    Stability/Clinical Decision Making  Evolving/Moderate complexity    Rehab Potential  Fair    PT Frequency  2x / week    PT Duration  --   10 weeks   PT Treatment/Interventions  ADLs/Self Care Home Management;Electrical Stimulation;DME Instruction;Gait training;Stair training;Functional mobility training;Therapeutic activities;Therapeutic exercise;Orthotic Fit/Training;Patient/family education;Neuromuscular re-education;Balance training;Manual techniques;Passive range of motion;Vestibular;Wheelchair mobility training    PT Next Visit Plan  Standing and gait training with RW with R hand orthosis - does well with visual target to "kick" towards; W/c mobility around obstacles especially things on right, w/c over threshold in doorway simulation.  Transfer training, bed mobility    Consulted and Agree with Plan of Care  Patient;Family member/caregiver    Family Member Consulted  husband       Patient will benefit from skilled therapeutic intervention in order to improve the following deficits and  impairments:  Abnormal gait, Decreased activity tolerance, Decreased balance, Decreased cognition, Decreased knowledge of use of DME, Decreased mobility, Decreased range of motion, Decreased safety awareness, Decreased strength, Impaired sensation  Visit Diagnosis: Hemiplegia and hemiparesis following nontraumatic intracerebral hemorrhage affecting right dominant side (HCC)  Other abnormalities of gait and mobility  Other disturbances of skin sensation  Muscle weakness (generalized)     Problem List Patient Active Problem List   Diagnosis Date Noted  . Nontraumatic subcortical hemorrhage of left cerebral hemisphere ([place]) 08/27/2019  . Acute blood loss anemia   . Hypoalbuminemia due to protein-calorie malnutrition ([place])   . Thrombocytopenia ([place])   . Dysphagia 07/29/2019  . Infarction of left basal ganglia ([place]) 07/18/2019  . Hypernatremia   .  Leukocytosis   . Essential hypertension   . Global aphasia   . Cytotoxic brain edema ([place]) 07/10/2019  . ICH (intracerebral hemorrhage) ([place]) 07/09/2019  . Anxiety state 02/21/2015  . Cephalalgia 12/21/2014    Rico Junker, PT, DPT 09/09/19    9:59 PM    [facility] [address] [place], [place], [address] Phone: [phone]   Fax:  [phone]  Name: Melanie Madden MRN: [id number] Date of Birth: 04/23/1966

## 2019-09-10 ENCOUNTER — Telehealth: Payer: Self-pay | Admitting: *Deleted

## 2019-09-10 NOTE — Patient Instructions (Signed)
  Use a cupped hand to mouth for drink/thirsty Use a hand touch to stomach for hungry Use the "t" sign for bathroom  Use pictures of rooms in the house to attempt to narrow the meaning of [place]'s message.

## 2019-09-10 NOTE — Therapy (Signed)
[facility] [address] [place], [place], [address] Phone: [phone]   Fax:  [phone]  Speech Language Pathology Treatment  Patient Details  Name: Melanie Madden MRN: [id number] Date of Birth: 02/22/1966 Referring Provider (SLP): Alysia Penna, MD   Encounter Date: 09/09/2019  End of Session - 09/10/19 1300    Visit Number  4    Number of Visits  25    Date for SLP Re-Evaluation  12/04/19    Authorization Type  cigna    Authorization Time Period  67 DAYS of therapy regardless of number disciplines per day    SLP Start Time  1150    SLP Stop Time   1230    SLP Time Calculation (min)  40 min    Activity Tolerance  Patient tolerated treatment well       Past Medical History:  Diagnosis Date  . Allergy   . Anxiety   . Arthritis   . High triglycerides   . Kidney stones   . Migraines   . Recurrent sinus infections     Past Surgical History:  Procedure Laterality Date  . ANTERIOR CRUCIATE LIGAMENT REPAIR Left   . BUNIONECTOMY Right   . LASIK    . LITHOTRIPSY    . MENISCUS REPAIR Left   . NASAL SEPTUM SURGERY    . SHOULDER SURGERY    . VARICOSE VEIN SURGERY Left     There were no vitals filed for this visit.  Subjective Assessment - 09/09/19 1409    Subjective  Pt arrives with En[place] Derry and husband    Currently in Pain?  Yes    Pain Location  Arm    Pain Orientation  Right            ADULT SLP TREATMENT - 09/10/19 0001      General Information   Behavior/Cognition  Alert;Cooperative;Distractible;Doesn't follow directions      Treatment Provided   Treatment provided  Cognitive-Linquistic      Cognitive-Linquistic Treatment   Treatment focused on  Aphasia;Apraxia;Patient/family/caregiver education    Skilled Treatment  SLP began session with pt writing her name - pt wrote her first and last name indpendently. SLP encouraged pt to write her friend's name however pt tossed pencil on the  table and made a quick "chop" gesture outside. SLP affirmed that pt looked frustrated - pt nodded. SLP worked with pt with written phrase comprehension (3-6 words) 88% success, and with initial attempted simultneous phoneme production with max cues including tactile on pt's lips. /m/ production 30%, /i/ 0%, /u/ approx 15%. Pt smiled twice, when she was successful with producing a phoneme with max cues. Long discussion about specificity of pt's gestures (they are not specific); SLP reminded pt/husband about SLP [place]'s suggestion of signed "t" (thub between first and midle fingers with fisted hand) for indicating toilet. Pt with some initial groping, made the sign in imitation of SLP. SLP suggested two other basic need signs - stomach touch to indicate hunger, and a cupped hand to mouth to indicate thirsty. SLP also reminded pt/friend/husband of using pictures to assist in narrowing meaning of pt message. Pt did not appear to have any knowledge about how indistinct her gestures are.      Assessment / Recommendations / Plan   Plan  Continue with current plan of care      Progression Toward Goals   Progression toward goals  Progressing toward goals       SLP Education -  09/10/19 1300    Education Details  gestures need to be more specific, hunger - thirst - bathroom gestures    Person(s) Educated  Patient;Spouse;Caregiver(s)    Methods  Explanation;Demonstration;Verbal cues    Comprehension  Verbalized understanding;Returned demonstration;Verbal cues required;Need further instruction       SLP Short Term Goals - 09/10/19 1303      SLP SHORT TERM GOAL #1   Title  pt will simultaneously with SLP produce 3 different phonemes 50% of the time over three sessions    Time  5    Period  Weeks    Status  On-going      SLP SHORT TERM GOAL #2   Title  pt will ID objects salient to pt in f:4 80% of the time over 3 sessions    Time  5    Period  Weeks    Status  On-going      SLP SHORT TERM GOAL #3    Title  pt will write her full name on first attempt over three sessions    Time  5    Period  Weeks    Status  On-going      SLP SHORT TERM GOAL #4   Title  pt will attempt expressive communication via multimodal means during 5 sessions    Time  5    Period  Weeks    Status  On-going      SLP SHORT TERM GOAL #5   Title  pt will demonstrate undertanding of simple 1-step functional commands with 70% accuracy with multimodal cues over 3 sessions    Time  5    Period  Weeks    Status  On-going      SLP SHORT TERM GOAL #6   Title  pt will look at object or line drawing and match the correct word from f:6 words 90% success over 3 sessions    Time  5    Period  Weeks    Status  New       SLP Long Term Goals - 09/10/19 1305      SLP LONG TERM GOAL #1   Title  pt's diet will be appropriately upgraded as needed by SLP    Time  11    Period  Weeks   or 25 total visits, for all LTGs   Status  On-going      SLP LONG TERM GOAL #2   Title  pt will produce 5 simple words/words pertinent to pt (family or pet names, etc) simultaneously with SLP and multimodal cues over three sessions    Time  11    Period  Weeks    Status  On-going      SLP LONG TERM GOAL #3   Title  pt will demo understanding of simple yes/no questions pertinent to desires/wants with multimodal cues 90% of the time over 3 sessions    Time  11    Period  Weeks    Status  On-going      SLP LONG TERM GOAL #4   Title  a device trial with a speech generating device will be initiated within the first 20 ST visits    Time  11    Period  Weeks    Status  On-going      SLP LONG TERM GOAL #5   Title  pt will demo recognition of written word for a common object 100% success over 3 sessions    Time  11    Period  Weeks    Status  On-going       Plan - 09/10/19 1301    Clinical Impression Statement  Pt currently on dys III/thin diet - no complaints with this diet to SLP knowledge. Today SLP provided  pt/husband/friend some ideas for using specific gestures to communicate, to practice at home. Pt presents with cognitive communication deficits in at least the areas of basic attention and basic awareness, a severe/profound expressive aphasia with verbal apraxia, and severe/profound receptive aphasia ("global aphasia"). See "skilled treatment" for more details of today's session. Pt cont to demonstrate frustration about her inability to perform certain communicative tasks - must have full pt effort for most success possible. Unsure if pt can complete entire WAB due to frustration and/or ability level. At some point pt may benefit from a speech generating device. She would cont to benefit from skilled ST focusing on incr'ing communicative effectiveness via multimodal means, incr'ing cognition in functional ways as able given pt's language deficit, and monitoring/upgrading pt's diet as appropriate.    Speech Therapy Frequency  2x / week    Duration  --   12 weeks or 25 visits      Patient will benefit from skilled therapeutic intervention in order to improve the following deficits and impairments:   Aphasia  Verbal apraxia  Cognitive communication deficit  Dysphagia, unspecified type    Problem List Patient Active Problem List   Diagnosis Date Noted  . Nontraumatic subcortical hemorrhage of left cerebral hemisphere ([place]) 08/27/2019  . Acute blood loss anemia   . Hypoalbuminemia due to protein-calorie malnutrition ([place])   . Thrombocytopenia ([place])   . Dysphagia 07/29/2019  . Infarction of left basal ganglia ([place]) 07/18/2019  . Hypernatremia   . Leukocytosis   . Essential hypertension   . Global aphasia   . Cytotoxic brain edema ([place]) 07/10/2019  . ICH (intracerebral hemorrhage) ([place]) 07/09/2019  . Anxiety state 02/21/2015  . Cephalalgia 12/21/2014    Cornish ,Gonzalez, [place]  09/10/2019, 1:06 PM  [facility] [address]  [place], [place], [address] Phone: [phone]   Fax:  [phone]   Name: Georginia Medearis MRN: [id number] Date of Birth: 08-13-1965

## 2019-09-10 NOTE — Telephone Encounter (Signed)
Patient states he has FMLA paperwork that he needs filled out.  I contacted patient and advised to bring it by our office.

## 2019-09-11 ENCOUNTER — Ambulatory Visit: Payer: Managed Care, Other (non HMO) | Admitting: Physical Therapy

## 2019-09-11 ENCOUNTER — Ambulatory Visit: Payer: Managed Care, Other (non HMO)

## 2019-09-11 ENCOUNTER — Other Ambulatory Visit: Payer: Self-pay

## 2019-09-11 ENCOUNTER — Ambulatory Visit: Payer: Managed Care, Other (non HMO) | Admitting: Occupational Therapy

## 2019-09-11 DIAGNOSIS — R2689 Other abnormalities of gait and mobility: Secondary | ICD-10-CM

## 2019-09-11 DIAGNOSIS — I69219 Unspecified symptoms and signs involving cognitive functions following other nontraumatic intracranial hemorrhage: Secondary | ICD-10-CM

## 2019-09-11 DIAGNOSIS — R41841 Cognitive communication deficit: Secondary | ICD-10-CM

## 2019-09-11 DIAGNOSIS — I69151 Hemiplegia and hemiparesis following nontraumatic intracerebral hemorrhage affecting right dominant side: Secondary | ICD-10-CM

## 2019-09-11 DIAGNOSIS — R1310 Dysphagia, unspecified: Secondary | ICD-10-CM

## 2019-09-11 DIAGNOSIS — M6281 Muscle weakness (generalized): Secondary | ICD-10-CM

## 2019-09-11 DIAGNOSIS — R482 Apraxia: Secondary | ICD-10-CM

## 2019-09-11 DIAGNOSIS — I69153 Hemiplegia and hemiparesis following nontraumatic intracerebral hemorrhage affecting right non-dominant side: Secondary | ICD-10-CM

## 2019-09-11 DIAGNOSIS — R208 Other disturbances of skin sensation: Secondary | ICD-10-CM

## 2019-09-11 DIAGNOSIS — R4701 Aphasia: Secondary | ICD-10-CM

## 2019-09-11 NOTE — Therapy (Signed)
[facility] [address] [place], [place], [address] Phone: [phone]   Fax:  [phone]  Physical Therapy Treatment  Patient Details  Name: Melanie Madden MRN: [id number] Date of Birth: 1965/08/04 Referring Provider (PT): Lauraine Rinne, [place]   Encounter Date: 09/11/2019  PT End of Session - 09/11/19 1748    Visit Number  4    Number of Visits  20    Date for PT Re-Evaluation  11/24/19    Authorization Type  [employer]- 60 VL combined PT/OT/ST    Authorization - Visit Number  4    Authorization - Number of Visits  20    PT Start Time  1232    PT Stop Time  1320    PT Time Calculation (min)  48 min    Equipment Utilized During Treatment  Gait belt    Activity Tolerance  Patient tolerated treatment well    Behavior During Therapy  Impulsive       Past Medical History:  Diagnosis Date  . Allergy   . Anxiety   . Arthritis   . High triglycerides   . Kidney stones   . Migraines   . Recurrent sinus infections     Past Surgical History:  Procedure Laterality Date  . ANTERIOR CRUCIATE LIGAMENT REPAIR Left   . BUNIONECTOMY Right   . LASIK    . LITHOTRIPSY    . MENISCUS REPAIR Left   . NASAL SEPTUM SURGERY    . SHOULDER SURGERY    . VARICOSE VEIN SURGERY Left     There were no vitals filed for this visit.  Subjective Assessment - 09/11/19 1737    Subjective  No changes since last visit    Pertinent History  anxiety, migraines, HTN    Patient Stated Goals  Per husband report they would like her to get stronger on right, improve mobility, and work on speech.    Currently in Pain?  No/denies                       [facility] Adult PT Treatment/Exercise - 09/11/19 0001      Transfers   Transfers  Sit to Stand;Stand to Sit    Sit to Stand  3: Mod assist    Sit to Stand Details  Verbal cues for technique   Cues for hand placement, as pt wants to reach to walker   Sit to Stand Details (indicate cue  type and reason)  From mat surface, therapist facilitates anterior translation of R femur for RLE extension with sit<>stand    Stand to Sit  4: Min assist    Stand to Sit Details (indicate cue type and reason)  Verbal cues for technique   Cues for hand placement-keeps LUE on walker   Stand Pivot Transfers  2: Max assist   due pt's impulsivity   Stand Pivot Transfer Details (indicate cue type and reason)  w/c<>mat x 2    Comments  Additional sit<>stand from w/c in parallel bars, 3 additional reps, with cues for hand placement and husband's management of wheelchair      Ambulation/Gait   Ambulation/Gait  Yes    Ambulation/Gait Assistance  2: Max assist    Ambulation/Gait Assistance Details  In parallel bars, using [place] sling on RUE for improved positioning, attempted gait x 8 ft    Ambulation Distance (Feet)  8 Feet   x 2   Assistive device  Parallel bars  Gait Pattern  Step-to pattern;Decreased step length - left;Decreased stance time - right;Decreased hip/knee flexion - right;Decreased weight shift to right;Lateral hip instability    Gait Comments  Pt provides cues at R quad for attempt at increased RLE extension for weightbearing in stance-pt unable.        Neuro Re-ed    Neuro Re-ed Details   Standing at [facility] with RUE in UE orthosis, brief periods of standing, <20 seconds with PT blocking R knee.  Pt attempts to walk when she stands at [facility].  Transitioned to parallel bars for use of visual cues of mirror and more static standing work.  In standing, PT in front of patient, with pt heavily leaning through LUE on parallel bar.  Pt able to place LUE on therapist shoulder with improved midline standing (still decreased weigthbearing through RLE).  PT provides tapping cues to gluts, to quads on R, with intermiitent glut activation.  After sitting break, with therapist on R side of patient,/therapist arm around waist, pt holds heavily to LUE on parallel bars, but iwth cues is able to lessen support  for more midline standing.  In w/c, pt performs "kick and dig" with RLE with max assist to propel w/c forward 8 ft, x 2 reps.               PT Short Term Goals - 08/31/19 1459      PT SHORT TERM GOAL #1   Title  Pt will be able to perform initial HEP with assist of husband for improved function.    Time  4    Period  Weeks    Status  New    Target Date  09/25/19      PT SHORT TERM GOAL #2   Title  Pt will transfer w/c to/from mat with slideboard CGA with assist to set up slideboard for improved mobility.    Time  4    Period  Weeks    Status  New    Target Date  09/25/19      PT SHORT TERM GOAL #3   Title  Pt will be able to stand x 2 min mod assist with PT blocking right knee for improved standing balance.    Time  4    Period  Weeks    Status  New    Target Date  09/25/19      PT SHORT TERM GOAL #4   Title  Pt will perform stand/pivot transfer mod assist of husband to allow improved safety with transfers at home.    Time  4    Period  Weeks    Status  New    Target Date  09/25/19      PT SHORT TERM GOAL #5   Title  Pt will perform all bed mobility min assist or less for improved mobility.    Time  4    Period  Weeks    Status  New    Target Date  09/25/19      PT SHORT TERM GOAL #6   Title  Pt will propel w/c  100' supervision for improved mobility in home.    Time  4    Period  Weeks    Status  New    Target Date  09/25/19        PT Long Term Goals - 08/26/19 0841      PT LONG TERM GOAL #1   Title  Pt will be able to perform  progressive HEP for strengthening, balance and mobility with assist of husband to continued gains at home.    Time  10    Period  Weeks    Status  New    Target Date  11/04/19      PT LONG TERM GOAL #2   Title  Pt will be able to perform all transfers min assist for improved safety with mobility.    Time  10    Period  Weeks    Status  New    Target Date  11/04/19      PT LONG TERM GOAL #3   Title  Pt will be able to  ambulate 10' with max assist for improved access to bathroom at home with husband with appropriate AD.    Time  10    Period  Weeks    Status  New    Target Date  11/04/19            Plan - 09/11/19 1749    Clinical Impression Statement  With standing in parallel bars, pt able to lessen strong and heavy lean to LLE with LUE placement on therapist shoulders while therapist in front of patient.  In this position with VCs and tapping, pt able to intermittently activate R gluts for more upright stand through RLE.  Pt will continue to benefit from skilled PT to address standing, NMR, transfers, and gait training.    Personal Factors and Comorbidities  Comorbidity 3+    Comorbidities  anxiety, migraines, HTN    Examination-Activity Limitations  Bathing;Bed Mobility;Stand;Locomotion Level;Squat;Stairs;Dressing;Transfers    Examination-Participation Restrictions  Community Activity;Driving;Shop;Laundry;Meal Prep    Stability/Clinical Decision Making  Evolving/Moderate complexity    Rehab Potential  Fair    PT Frequency  2x / week    PT Duration  --   10 weeks   PT Treatment/Interventions  ADLs/Self Care Home Management;Electrical Stimulation;DME Instruction;Gait training;Stair training;Functional mobility training;Therapeutic activities;Therapeutic exercise;Orthotic Fit/Training;Patient/family education;Neuromuscular re-education;Balance training;Manual techniques;Passive range of motion;Vestibular;Wheelchair mobility training    PT Next Visit Plan  Standing and gait training with RW with R hand orthosis - does well with visual target to "kick" towards; W/c mobility around obstacles especially things on right, w/c over threshold in doorway simulation.  Transfer training, bed mobility  Reiterate to pt/husband gait discussion from previous visit for optimal safety at home    Consulted and Agree with Plan of Care  Patient;Family member/caregiver    Family Member Consulted  husband       Patient  will benefit from skilled therapeutic intervention in order to improve the following deficits and impairments:  Abnormal gait, Decreased activity tolerance, Decreased balance, Decreased cognition, Decreased knowledge of use of DME, Decreased mobility, Decreased range of motion, Decreased safety awareness, Decreased strength, Impaired sensation  Visit Diagnosis: Muscle weakness (generalized)  Hemiplegia and hemiparesis following nontraumatic intracerebral hemorrhage affecting right dominant side (HCC)  Other abnormalities of gait and mobility     Problem List Patient Active Problem List   Diagnosis Date Noted  . Nontraumatic subcortical hemorrhage of left cerebral hemisphere ([place]) 08/27/2019  . Acute blood loss anemia   . Hypoalbuminemia due to protein-calorie malnutrition ([place])   . Thrombocytopenia ([place])   . Dysphagia 07/29/2019  . Infarction of left basal ganglia ([place]) 07/18/2019  . Hypernatremia   . Leukocytosis   . Essential hypertension   . Global aphasia   . Cytotoxic brain edema ([place]) 07/10/2019  . ICH (intracerebral hemorrhage) ([place]) 07/09/2019  . Anxiety state 02/21/2015  .  Cephalalgia 12/21/2014    Melanie Madden W. 09/11/2019, 5:53 PM Frazier Butt., PT [facility] [address] [place], [place], [address] Phone: [phone]   Fax:  [phone]  Name: Melanie Madden MRN: [id number] Date of Birth: 1965-08-24

## 2019-09-11 NOTE — Therapy (Signed)
[facility] [address] [place], [place], [address] Phone: [phone]   Fax:  [phone]  Speech Language Pathology Treatment  Patient Details  Name: Melanie Madden MRN: [id number] Date of Birth: Nov 21, 1965 Referring Provider (SLP): Alysia Penna, MD   Encounter Date: 09/11/2019  End of Session - 09/11/19 1314    Visit Number  5    Number of Visits  25    Date for SLP Re-Evaluation  12/04/19    Authorization Type  cigna    Authorization Time Period  60 DAYS of therapy regardless of number disciplines per day    SLP Start Time  1150    SLP Stop Time   1230    SLP Time Calculation (min)  40 min    Activity Tolerance  Patient tolerated treatment well       Past Medical History:  Diagnosis Date  . Allergy   . Anxiety   . Arthritis   . High triglycerides   . Kidney stones   . Migraines   . Recurrent sinus infections     Past Surgical History:  Procedure Laterality Date  . ANTERIOR CRUCIATE LIGAMENT REPAIR Left   . BUNIONECTOMY Right   . LASIK    . LITHOTRIPSY    . MENISCUS REPAIR Left   . NASAL SEPTUM SURGERY    . SHOULDER SURGERY    . VARICOSE VEIN SURGERY Left     There were no vitals filed for this visit.  Subjective Assessment - 09/11/19 1156    Subjective  Pt arrives husband today.    Patient is accompained by:  Family member            ADULT SLP TREATMENT - 09/11/19 1203      General Information   Behavior/Cognition  Alert;Cooperative;Distractible;Doesn't follow directions      Treatment Provided   Treatment provided  Cognitive-Linquistic      Cognitive-Linquistic Treatment   Treatment focused on  Aphasia;Apraxia    Skilled Treatment  Spoke with pt/husband anc collaborated on some ideas to have pt choose dinner items. Pictures with words of items and pt will choose item - SLP suggested they write selections on calendar. SLP worked with pt with /h/ and /m/ to incr pt's  spontaneous volitional speech and incr'ing pt's awareness. Pt became more successful as the 15 minutes progressed that SLP focused on this. SLP told pt that husband was going to help her, and that in order to have the best chance at regaining speech ability she was going to have to do some practice at home. Pt, husband and SLP collaborated on another sign for "lights off" which is a fist going to spread fingers or spread fingers going to a fist.      Assessment / Recommendations / [place] with current plan of care      Progression Toward Goals   Progression toward goals  Progressing toward goals       SLP Education - 09/11/19 1314    Education Details  how to assist pt with /h/ and /m/ at home; pt will need to practice at home for best possible outomces    Person(s) Educated  Patient;Spouse    Methods  Explanation;Demonstration;Verbal cues    Comprehension  Verbalized understanding;Verbal cues required;Need further instruction       SLP Short Term Goals - 09/11/19 1316      SLP [place] #1   Title  pt will simultaneously  with SLP produce 3 different phonemes 50% of the time over three sessions    Time  5    Period  Weeks    Status  On-going      SLP SHORT TERM GOAL #2   Title  pt will ID objects salient to pt in f:4 80% of the time over 3 sessions    Time  5    Period  Weeks    Status  On-going      SLP SHORT TERM GOAL #3   Title  pt will write her full name on first attempt over three sessions    Time  5    Period  Weeks    Status  On-going      SLP SHORT TERM GOAL #4   Title  pt will attempt expressive communication via multimodal means during 5 sessions    Time  5    Period  Weeks    Status  On-going      SLP SHORT TERM GOAL #5   Title  pt will demonstrate undertanding of simple 1-step functional commands with 70% accuracy with multimodal cues over 3 sessions    Time  5    Period  Weeks    Status  On-going      SLP SHORT TERM GOAL #6   Title   pt will look at object or line drawing and match the correct word from f:6 words 90% success over 3 sessions    Time  5    Period  Weeks    Status  New       SLP Long Term Goals - 09/11/19 1316      SLP LONG TERM GOAL #1   Title  pt's diet will be appropriately upgraded as needed by SLP    Time  11    Period  Weeks   or 25 total visits, for all LTGs   Status  On-going      SLP LONG TERM GOAL #2   Title  pt will produce 5 simple words/words pertinent to pt (family or pet names, etc) simultaneously with SLP and multimodal cues over three sessions    Time  11    Period  Weeks    Status  On-going      SLP LONG TERM GOAL #3   Title  pt will demo understanding of simple yes/no questions pertinent to desires/wants with multimodal cues 90% of the time over 3 sessions    Time  11    Period  Weeks    Status  On-going      SLP LONG TERM GOAL #4   Title  a device trial with a speech generating device will be initiated within the first 20 ST visits    Time  11    Period  Weeks    Status  On-going      SLP LONG TERM GOAL #5   Title  pt will demo recognition of written word for a common object 100% success over 3 sessions    Time  11    Period  Weeks    Status  On-going       Plan - 09/11/19 1316    Clinical Impression Statement  Pt currently on dys III/thin diet - no complaints with this diet to SLP knowledge. Today SLP provided pt/husband/friend some ideas for using specific gestures to communicate, to practice at home. Pt presents with cognitive communication deficits in at least the areas of basic attention  and basic awareness, a severe/profound expressive aphasia with verbal apraxia, and severe/profound receptive aphasia ("global aphasia"). See "skilled treatment" for more details of today's session. Pt cont to demonstrate frustration about her inability to perform certain communicative tasks - must have full pt effort for most success possible. Unsure if pt can complete entire WAB  due to frustration and/or ability level. At some point pt may benefit from a speech generating device. She would cont to benefit from skilled ST focusing on incr'ing communicative effectiveness via multimodal means, incr'ing cognition in functional ways as able given pt's language deficit, and monitoring/upgrading pt's diet as appropriate.    Speech Therapy Frequency  2x / week    Duration  --   12 weeks or 25 visits      Patient will benefit from skilled therapeutic intervention in order to improve the following deficits and impairments:   Aphasia  Verbal apraxia  Cognitive communication deficit  Dysphagia, unspecified type    Problem List Patient Active Problem List   Diagnosis Date Noted  . Nontraumatic subcortical hemorrhage of left cerebral hemisphere ([place]) 08/27/2019  . Acute blood loss anemia   . Hypoalbuminemia due to protein-calorie malnutrition ([place])   . Thrombocytopenia ([place])   . Dysphagia 07/29/2019  . Infarction of left basal ganglia ([place]) 07/18/2019  . Hypernatremia   . Leukocytosis   . Essential hypertension   . Global aphasia   . Cytotoxic brain edema ([place]) 07/10/2019  . ICH (intracerebral hemorrhage) ([place]) 07/09/2019  . Anxiety state 02/21/2015  . Cephalalgia 12/21/2014    Ayvin Lipinski ,MS, [place]  09/11/2019, 1:16 PM  [facility] [address] [place], [place], [address] Phone: [phone]   Fax:  [phone]   Name: Melanie Madden MRN: [id number] Date of Birth: [birth year]

## 2019-09-11 NOTE — Therapy (Addendum)
[facility] [address] [place], [place], [address] Phone: [phone]   Fax:  [phone]  Occupational Therapy Treatment  Patient Details  Name: Melanie Madden MRN: [id number] Date of Birth: [birth year] Referring Provider (OT): Dr. Alysia Penna   Encounter Date: 09/11/2019  OT End of Session - 09/11/19 1426    Visit Number  3    Number of Visits  25    Date for OT Re-Evaluation  12/01/19    Authorization Type  [employer] 2021:  No prior auth OT, 60 DAY visit limit (regardless of how many therapies each day).    Authorization - Visit Number  6    Authorization - Number of Visits  60    OT Start Time  [id number]    OT Stop Time  1400    OT Time Calculation (min)  42 min    Activity Tolerance  Patient tolerated treatment well    Behavior During Therapy  Impulsive   also frustrated during session d/t inability to communicate      Past Medical History:  Diagnosis Date  . Allergy   . Anxiety   . Arthritis   . High triglycerides   . Kidney stones   . Migraines   . Recurrent sinus infections     Past Surgical History:  Procedure Laterality Date  . ANTERIOR CRUCIATE LIGAMENT REPAIR Left   . BUNIONECTOMY Right   . LASIK    . LITHOTRIPSY    . MENISCUS REPAIR Left   . NASAL SEPTUM SURGERY    . SHOULDER SURGERY    . VARICOSE VEIN SURGERY Left     There were no vitals filed for this visit.  Subjective Assessment - 09/11/19 1425    Subjective   inconsistent responses due to aphasia    Pertinent History  Subcortical hemorrhage of R cerbral hemisphere.  PMH:  HTN, anxiety, migraines, hx of shoulder injection approx 2 weeks ago for R shoulder pain, hx of R shoulder surgery (arthroscopic)    Limitations  aphasia, fall risk, impulsive    Patient Stated Goals  husband reports: to be able to care for herself more (pt indicates that she onces to return to previous activities per questioning/gestures)    Currently in Pain?   No/denies                 Treatment: Pt transferred to mat with min A, with v.c to slow down and for positioning Supine gentle P/ROM finger flexion/ extension, elbow flexion/ extension and shoulder flexion followed by pt performing self ROM shoulder flexion with therapist facilitating shoulder and scapular positioning, mod v.c due to impulsivity. Supine to sit edge of mat, mod A due to pt's poor body awareness and impulsivity. Seated edge of mat weight bearing through RUE with body on arm movements, min-mod facilitation/ v.c Therapist  attempted weightbearing through tilted stool, however pt does not demonstrate adequate activation. Donning/ doffing pullover sweatshirt with hemi techniques, mod-mad A to attend to and dress RUE Pt's husband reports ambulating pt to bathroom with walker. Therapist recommends pt uses 3 in 1 and transfers due to risk for injury due to pt's sensory deficits and poor body awareness.            OT Short Term Goals - 09/02/19 1219      OT SHORT TERM GOAL #1   Title  Pt/husband will be independent with initial HEP for RUE ROM--check STGs 10/14/19    Time  6  Period  Weeks    Status  New      OT SHORT TERM GOAL #2   Title  Pt will perform UB dressing with min A.    Time  6    Period  Weeks    Status  New      OT SHORT TERM GOAL #3   Title  Pt will perform UB bathing with mod A.    Time  6    Period  Weeks    Status  New      OT SHORT TERM GOAL #4   Title  Pt will be able to brush hair with set-up.    Time  6    Period  Weeks    Status  New      OT SHORT TERM GOAL #5   Title  Pt will demo at least 110* shoulder flex PROM in supine without pain for incr ease for ADLs.    Time  6    Period  Weeks    Status  New        OT Long Term Goals - 09/02/19 1229      OT LONG TERM GOAL #1   Title  Pt/husband will be independent with HEP for functional activities for incr participation in IADLs/leisure tasks and for cognition (and  incorporating RUE if able).--check LTGs 12/01/19    Time  12    Period  Weeks    Status  New      OT LONG TERM GOAL #2   Title  Pt will perform UB dressing with supervision.    Time  12    Period  Weeks    Status  New      OT LONG TERM GOAL #3   Title  Pt will perform LB dressing with mod A.    Time  12    Period  Weeks    Status  New      OT LONG TERM GOAL #4   Title  Pt will perform UB bathing with min A.    Time  12    Period  Weeks    Status  New      OT LONG TERM GOAL #5   Title  Pt will perform simple cold snack prep from w/c level with min A/set up.    Time  12    Period  Weeks    Status  New      OT LONG TERM GOAL #6   Title  Pt will perform toileting with mod A.    Time  12    Period  Weeks    Status  New            Plan - 09/11/19 1426    Clinical Impression Statement  Pt demonstrates poor body awareness, sensation and impulsivity. Pt's husband is very supportive.    Occupational performance deficits (Please refer to evaluation for details):  ADL's;IADL's;Leisure;Work;Social Participation    Body Structure / Function / Physical Skills  ADL;ROM;IADL;Sensation;Mobility;Strength;Tone;UE functional use;Decreased knowledge of precautions;Decreased knowledge of use of DME;Coordination;Balance;FMC;GMC    Cognitive Skills  Attention;Temperament/Personality;Understand;Thought;Safety Awareness    Rehab Potential  Good    OT Frequency  2x / week    OT Duration  12 weeks    OT Treatment/Interventions  Self-care/ADL training;Moist Heat;DME and/or AE instruction;Splinting;Therapeutic activities;Aquatic Therapy;Cognitive remediation/compensation;Therapeutic exercise;Cryotherapy;Neuromuscular education;Functional Mobility Training;Passive range of motion;Visual/perceptual remediation/compensation;Patient/family education;Manual Therapy;Electrical Stimulation;Fluidtherapy    Plan  simple ADLs, continue to address body awareness, scanning to  Rt,    Consulted and Agree with  Plan of Care  Patient;Family member/caregiver    Family Member Consulted  husband       Patient will benefit from skilled therapeutic intervention in order to improve the following deficits and impairments:   Body Structure / Function / Physical Skills: ADL, ROM, IADL, Sensation, Mobility, Strength, Tone, UE functional use, Decreased knowledge of precautions, Decreased knowledge of use of DME, Coordination, Balance, FMC, GMC Cognitive Skills: Attention, Temperament/Personality, Understand, Thought, Safety Awareness     Visit Diagnosis: Hemiplegia and hemiparesis following nontraumatic intracerebral hemorrhage affecting right dominant side (HCC)  Other disturbances of skin sensation  Unspecified symptoms and signs involving cognitive functions following other nontraumatic intracranial hemorrhage  Muscle weakness (generalized)  Hemiplegia and hemiparesis following nontraumatic intracerebral hemorrhage affecting right non-dominant side ([facility])    Problem List Patient Active Problem List   Diagnosis Date Noted  . Nontraumatic subcortical hemorrhage of left cerebral hemisphere ([facility]) 08/27/2019  . Acute blood loss anemia   . Hypoalbuminemia due to protein-calorie malnutrition ([facility])   . Thrombocytopenia ([facility])   . Dysphagia 07/29/2019  . Infarction of left basal ganglia ([facility]) 07/18/2019  . Hypernatremia   . Leukocytosis   . Essential hypertension   . Global aphasia   . Cytotoxic brain edema ([facility]) 07/10/2019  . ICH (intracerebral hemorrhage) ([place]) 07/09/2019  . Anxiety state 02/21/2015  . Cephalalgia 12/21/2014    Melanie Madden 09/11/2019, 2:28 PM  [facility] [address] [place], [place], [address] Phone: [phone]   Fax:  [phone]  Name: Melanie Madden MRN: [id number] Date of Birth: April 04, 1966

## 2019-09-14 ENCOUNTER — Telehealth: Payer: Self-pay

## 2019-09-14 NOTE — Telephone Encounter (Signed)
Did not leave phone number - Melanie Madden left 2 voicemails with no follow up number regarding and issue with her rx  12:32 and 12:36 pm [phone]

## 2019-09-14 NOTE — Telephone Encounter (Signed)
Contacted patients caregiver.  He explained this call was regarding a knee brace.  It has since been worked out. Patient will be evaluated by PT.  If appropriate order will be submitted to our physician for approval

## 2019-09-15 ENCOUNTER — Ambulatory Visit: Payer: Managed Care, Other (non HMO) | Admitting: Physical Therapy

## 2019-09-15 ENCOUNTER — Ambulatory Visit: Payer: Managed Care, Other (non HMO)

## 2019-09-15 ENCOUNTER — Ambulatory Visit: Payer: Managed Care, Other (non HMO) | Admitting: Occupational Therapy

## 2019-09-15 ENCOUNTER — Other Ambulatory Visit: Payer: Self-pay

## 2019-09-15 DIAGNOSIS — M6281 Muscle weakness (generalized): Secondary | ICD-10-CM

## 2019-09-15 DIAGNOSIS — R2689 Other abnormalities of gait and mobility: Secondary | ICD-10-CM

## 2019-09-15 DIAGNOSIS — R41841 Cognitive communication deficit: Secondary | ICD-10-CM

## 2019-09-15 DIAGNOSIS — R4701 Aphasia: Secondary | ICD-10-CM

## 2019-09-15 DIAGNOSIS — R208 Other disturbances of skin sensation: Secondary | ICD-10-CM

## 2019-09-15 DIAGNOSIS — I69151 Hemiplegia and hemiparesis following nontraumatic intracerebral hemorrhage affecting right dominant side: Secondary | ICD-10-CM

## 2019-09-15 DIAGNOSIS — R1310 Dysphagia, unspecified: Secondary | ICD-10-CM

## 2019-09-15 DIAGNOSIS — R482 Apraxia: Secondary | ICD-10-CM

## 2019-09-15 DIAGNOSIS — I69153 Hemiplegia and hemiparesis following nontraumatic intracerebral hemorrhage affecting right non-dominant side: Secondary | ICD-10-CM

## 2019-09-15 NOTE — Therapy (Signed)
[facility] [address] [place], [place], [address] Phone: [phone]   Fax:  [phone]  Occupational Therapy Treatment  Patient Details  Name: Melanie Madden MRN: [id number] Date of Birth: 04-04-1966 Referring Provider (OT): Dr. Alysia Penna   Encounter Date: 09/15/2019  OT End of Session - 09/15/19 1451    Visit Number  4    Number of Visits  25    Date for OT Re-Evaluation  12/01/19    Authorization Type  [employer] 2021:  No prior auth OT, 60 DAY visit limit (regardless of how many therapies each day).    Authorization - Visit Number  7    Authorization - Number of Visits  14    OT Start Time  1230    OT Stop Time  1315    OT Time Calculation (min)  45 min    Activity Tolerance  Patient tolerated treatment well    Behavior During Therapy  Impulsive;Agitated       Past Medical History:  Diagnosis Date  . Allergy   . Anxiety   . Arthritis   . High triglycerides   . Kidney stones   . Migraines   . Recurrent sinus infections     Past Surgical History:  Procedure Laterality Date  . ANTERIOR CRUCIATE LIGAMENT REPAIR Left   . BUNIONECTOMY Right   . LASIK    . LITHOTRIPSY    . MENISCUS REPAIR Left   . NASAL SEPTUM SURGERY    . SHOULDER SURGERY    . VARICOSE VEIN SURGERY Left     There were no vitals filed for this visit.  Subjective Assessment - 09/15/19 1235    Subjective   inconsistent responses due to aphasia    Patient is accompanied by:  Family member   husband   Pertinent History  Subcortical hemorrhage of R cerbral hemisphere.  PMH:  HTN, anxiety, migraines, hx of shoulder injection approx 2 weeks ago for R shoulder pain, hx of R shoulder surgery (arthroscopic)    Limitations  aphasia, fall risk, impulsive    Patient Stated Goals  husband reports: to be able to care for herself more (pt indicates that she onces to return to previous activities per questioning/gestures)    Currently in Pain?   No/denies      Pt appeared very frustrated/angry at beginning of session and jerked arm away when therapist reached for it as well as throwing her coat on the floor. Husband reports she was mad at him.  Issued D-ring splint for Rt wrist support and instructed pt/husband on wear and care.  Worked on neuro re-education w/ focus on trunk control, light wt bearing over Rt arm and Rt elbow w/ therapist on Rt side, and to increase awareness to Rt side of body. Pt w/ poor righting reaction and cannot control if falling to Rt side (while seated) Also worked on lifting Rt leg over Lt thigh w/ LT arm for trunk control, body awareness, and in prep for LB dressing - practiced taking laces off shoe button then replacing.  Worked on hemi dressing techniques for donning/doffing jacket x 3 - w/ repetition but only needed min to mod assist. Reinforced technique w/ husband                        OT Short Term Goals - 09/15/19 1452      OT SHORT TERM GOAL #1   Title  Pt/husband will be independent  with initial HEP for RUE ROM--check STGs 10/14/19    Time  6    Period  Weeks    Status  On-going      OT SHORT TERM GOAL #2   Title  Pt will perform UB dressing with min A.    Time  6    Period  Weeks    Status  On-going      OT SHORT TERM GOAL #3   Title  Pt will perform UB bathing with mod A.    Time  6    Period  Weeks    Status  New      OT SHORT TERM GOAL #4   Title  Pt will be able to brush hair with set-up.    Time  6    Period  Weeks    Status  New      OT SHORT TERM GOAL #5   Title  Pt will demo at least 110* shoulder flex PROM in supine without pain for incr ease for ADLs.    Time  6    Period  Weeks    Status  New        OT Long Term Goals - 09/02/19 1229      OT LONG TERM GOAL #1   Title  Pt/husband will be independent with HEP for functional activities for incr participation in IADLs/leisure tasks and for cognition (and incorporating RUE if able).--check LTGs  12/01/19    Time  12    Period  Weeks    Status  New      OT LONG TERM GOAL #2   Title  Pt will perform UB dressing with supervision.    Time  12    Period  Weeks    Status  New      OT LONG TERM GOAL #3   Title  Pt will perform LB dressing with mod A.    Time  12    Period  Weeks    Status  New      OT LONG TERM GOAL #4   Title  Pt will perform UB bathing with min A.    Time  12    Period  Weeks    Status  New      OT LONG TERM GOAL #5   Title  Pt will perform simple cold snack prep from w/c level with min A/set up.    Time  12    Period  Weeks    Status  New      OT LONG TERM GOAL #6   Title  Pt will perform toileting with mod A.    Time  12    Period  Weeks    Status  New            Plan - 09/15/19 1452    Clinical Impression Statement  Pt demonstrates poor body awareness, sensation and impulsivity. However did notice improvement with attending to RUE/RLE w/ less cueing.    Occupational performance deficits (Please refer to evaluation for details):  ADL's;IADL's;Leisure;Work;Social Participation    Body Structure / Function / Physical Skills  ADL;ROM;IADL;Sensation;Mobility;Strength;Tone;UE functional use;Decreased knowledge of precautions;Decreased knowledge of use of DME;Coordination;Balance;FMC;GMC    Cognitive Skills  Attention;Temperament/Personality;Understand;Thought;Safety Awareness    Rehab Potential  Good    OT Frequency  2x / week    OT Duration  12 weeks    OT Treatment/Interventions  Self-care/ADL training;Moist Heat;DME and/or AE instruction;Splinting;Therapeutic activities;Aquatic Therapy;Cognitive remediation/compensation;Therapeutic exercise;Cryotherapy;Neuromuscular education;Functional  Mobility Training;Passive range of motion;Visual/perceptual remediation/compensation;Patient/family education;Manual Therapy;Electrical Stimulation;Fluidtherapy    Plan  continue to work on [employer] (hemi-dressing/bathing), body awareness, trunk control and light wt  bearing RUE in controlled setting. Consider resting hand splint for pm support    Consulted and Agree with Plan of Care  Patient;Family member/caregiver    Family Member Consulted  husband       Patient will benefit from skilled therapeutic intervention in order to improve the following deficits and impairments:   Body Structure / Function / Physical Skills: ADL, ROM, IADL, Sensation, Mobility, Strength, Tone, UE functional use, Decreased knowledge of precautions, Decreased knowledge of use of DME, Coordination, Balance, FMC, GMC Cognitive Skills: Attention, Temperament/Personality, Understand, Thought, Safety Awareness     Visit Diagnosis: Hemiplegia and hemiparesis following nontraumatic intracerebral hemorrhage affecting right dominant side (HCC)  Other disturbances of skin sensation  Muscle weakness (generalized)    Problem List Patient Active Problem List   Diagnosis Date Noted  . Nontraumatic subcortical hemorrhage of left cerebral hemisphere ([facility]) 08/27/2019  . Acute blood loss anemia   . Hypoalbuminemia due to protein-calorie malnutrition ([place])   . Thrombocytopenia ([facility])   . Dysphagia 07/29/2019  . Infarction of left basal ganglia ([facility]) 07/18/2019  . Hypernatremia   . Leukocytosis   . Essential hypertension   . Global aphasia   . Cytotoxic brain edema ([place]) 07/10/2019  . ICH (intracerebral hemorrhage) ([facility]) 07/09/2019  . Anxiety state 02/21/2015  . Cephalalgia 12/21/2014    Carey Bullocks, OTR/L 09/15/2019, 2:55 PM  [facility] [address] [place], [place], [address] Phone: [phone]   Fax:  [phone]  Name: Melanie Madden MRN: [id number] Date of Birth: 1966-02-02

## 2019-09-16 ENCOUNTER — Telehealth: Payer: Self-pay

## 2019-09-16 NOTE — Therapy (Signed)
[facility] [address] [place], [place], [address] Phone: [phone]   Fax:  [phone]  Physical Therapy Treatment  Patient Details  Name: Melanie Madden MRN: [id number] Date of Birth: [birth year] Referring Provider (PT): Lauraine Rinne, [place]   Encounter Date: 09/15/2019  PT End of Session - 09/16/19 1008    Visit Number  5    Number of Visits  20    Date for PT Re-Evaluation  11/24/19    Authorization Type  [employer]- 60 VL combined PT/OT/ST    Authorization - Visit Number  5    Authorization - Number of Visits  20    PT Start Time  1103    PT Stop Time  1145    PT Time Calculation (min)  42 min    Equipment Utilized During Treatment  Gait belt   pt's R AFO   Activity Tolerance  Patient tolerated treatment well    Behavior During Therapy  Impulsive       Past Medical History:  Diagnosis Date  . Allergy   . Anxiety   . Arthritis   . High triglycerides   . Kidney stones   . Migraines   . Recurrent sinus infections     Past Surgical History:  Procedure Laterality Date  . ANTERIOR CRUCIATE LIGAMENT REPAIR Left   . BUNIONECTOMY Right   . LASIK    . LITHOTRIPSY    . MENISCUS REPAIR Left   . NASAL SEPTUM SURGERY    . SHOULDER SURGERY    . VARICOSE VEIN SURGERY Left     There were no vitals filed for this visit.  Subjective Assessment - 09/16/19 0956    Subjective  Husband reports that pt took a step today on RLE.    Pertinent History  anxiety, migraines, HTN    Patient Stated Goals  Per husband report they would like her to get stronger on right, improve mobility, and work on speech.    Currently in Pain?  No/denies                       [facility] Adult PT Treatment/Exercise - 09/15/19 1110      Transfers   Transfers  Sit to Stand;Stand to Sit    Sit to Stand  3: Mod assist    Sit to Stand Details  Verbal cues for technique    Sit to Stand Details (indicate cue type and reason)  Cues  for L hand placement, to push from mat; performed sit>stand from mat to RW    Stand to Sit  4: Min assist;To bed;To chair/3-in-1    Stand to Sit Details (indicate cue type and reason)  Verbal cues for technique    Stand to Sit Details  Pt impulsive in sitting down at w/c prior to brakes being locked    Stand Pivot Transfer Details (indicate cue type and reason)       Squat Pivot Transfers  4: Min assist    Squat Pivot Transfer Details (indicate cue type and reason)  W/C>mat transfer with PT providing w/c set-up to ensure safety with transfer.    Number of Reps  --   3 reps sit<>stand at edge of mat     Ambulation/Gait   Ambulation/Gait  Yes    Ambulation/Gait Assistance  2: Max assist    Ambulation/Gait Assistance Details  Used RUE orthosis on RW, pt wearing AFO; PT provides assistance for control of RLE in stance,  assistance to advance RLE in swing phase.  PT able to initiate hip flexion/external rotation, but not able to independently advance foot.    Ambulation Distance (Feet)  6 Feet    Assistive device  Rolling walker   RUE orthosis, R AFO   Gait Pattern  Step-to pattern;Decreased step length - left;Decreased stance time - right;Decreased hip/knee flexion - right;Decreased weight shift to right;Lateral hip instability;Right flexed knee in stance    Ambulation Surface  Level;Indoor      Neuro Re-ed    Neuro Re-ed Details   Using powderboard with pt in sidelying on L side:  with knee flexed, hip extension x 10 reps, hip flexion x 10 reps, A/AROM with tapping cues, with quick stretch, then into ROM.  PT able to initiate through partial ROM in hip flexion and hip extension, needs assistance/cues to avoid compensation from abdominals, other muscle groups;  tapping facilitation for knee flexion/knee extension x 10 reps each, with quick stretch then movement into ROM.  Pt able to initiate in each direction, but PT completes full ROM.      Exercises   Exercises  Knee/Hip      Knee/Hip  Exercises: Supine   Bridges  Strengthening;Both;1 set;10 reps    Bridges Limitations  tapping cues at R gluts to increase activation.      Single Leg Bridge  Strengthening;Right;1 set;10 reps    Other Supine Knee/Hip Exercises  Hooklying hip adduction x 5 reps, 2 sets (PT takes leg gently out to side, pt able to adduct to midline); then hooklying abduction, x 5 reps,2 sets, PT providing resistance to slowly complete abduction for improved control       Therapeutic Activity: In addition to transfers noted above, bed mobility as follows: -Sit<>supine, minimal assistance, with PT assisting with RLE management -Scooting in bed towards R side:  PT cues patient verbally>manually to get into hooklying position, then PT provides assist at shoulders, hips, feet to scoot laterally x 4 reps -Supine>L sidelying with minimal assistance, PT assists in bringing RLE into correct position, as pt leaves RLE on mat, needs assistance for stacked hips sidelying position to avoid lying back towards supine      PT Education - 09/16/19 1006    Education Details  Discussed gait safety at home:  requested that pt not walk at home at this time with hemiwalker or RW, as RLE weakness/decreased safety awareness may put her at higher risk of falls.  Husband asks about knee brace; PT discussed limitations/benefits, and may trial at next session knee immobilizer    Person(s) Educated  Patient;Spouse    Methods  Explanation    Comprehension  Verbalized understanding       PT Short Term Goals - 08/31/19 1459      PT SHORT TERM GOAL #1   Title  Pt will be able to perform initial HEP with assist of husband for improved function.    Time  4    Period  Weeks    Status  New    Target Date  09/25/19      PT SHORT TERM GOAL #2   Title  Pt will transfer w/c to/from mat with slideboard CGA with assist to set up slideboard for improved mobility.    Time  4    Period  Weeks    Status  New    Target Date  09/25/19       PT SHORT TERM GOAL #3   Title  Pt will be  able to stand x 2 min mod assist with PT blocking right knee for improved standing balance.    Time  4    Period  Weeks    Status  New    Target Date  09/25/19      PT SHORT TERM GOAL #4   Title  Pt will perform stand/pivot transfer mod assist of husband to allow improved safety with transfers at home.    Time  4    Period  Weeks    Status  New    Target Date  09/25/19      PT SHORT TERM GOAL #5   Title  Pt will perform all bed mobility min assist or less for improved mobility.    Time  4    Period  Weeks    Status  New    Target Date  09/25/19      PT SHORT TERM GOAL #6   Title  Pt will propel w/c  100' supervision for improved mobility in home.    Time  4    Period  Weeks    Status  New    Target Date  09/25/19        PT Long Term Goals - 08/26/19 0841      PT LONG TERM GOAL #1   Title  Pt will be able to perform progressive HEP for strengthening, balance and mobility with assist of husband to continued gains at home.    Time  10    Period  Weeks    Status  New    Target Date  11/04/19      PT LONG TERM GOAL #2   Title  Pt will be able to perform all transfers min assist for improved safety with mobility.    Time  10    Period  Weeks    Status  New    Target Date  11/04/19      PT LONG TERM GOAL #3   Title  Pt will be able to ambulate 10' with max assist for improved access to bathroom at home with husband with appropriate AD.    Time  10    Period  Weeks    Status  New    Target Date  11/04/19            Plan - 09/16/19 1010    Clinical Impression Statement  With supine and sidelying powderboard NMR exercises, pt is able to activate hip extension, hip adduction and minimal hip flexion.  In standing and initiation of gait, pt able to initiate R hip flexion, but not able to advance RLE.  Husband states she is using RW with him at home and asks about knee brace.  Cautioned pt/husband about walking at home yet,  with PT recommending they not walk at home due to pt's significant RLE weakness and decreased safety awareness.  Pt will continue to benefit from skilled PT to further address NMR, RLE strengthening, transfers, standing and gait training.    Personal Factors and Comorbidities  Comorbidity 3+    Comorbidities  anxiety, migraines, HTN    Examination-Activity Limitations  Bathing;Bed Mobility;Stand;Locomotion Level;Squat;Stairs;Dressing;Transfers    Examination-Participation Restrictions  Community Activity;Driving;Shop;Laundry;Meal Prep    Stability/Clinical Decision Making  Evolving/Moderate complexity    Rehab Potential  Fair    PT Frequency  2x / week    PT Duration  --   10 weeks   PT Treatment/Interventions  ADLs/Self Care Home Management;Electrical Stimulation;DME Instruction;Gait training;Stair  training;Functional mobility training;Therapeutic activities;Therapeutic exercise;Orthotic Fit/Training;Patient/family education;Neuromuscular re-education;Balance training;Manual techniques;Passive range of motion;Vestibular;Wheelchair mobility training    PT Next Visit Plan  Use of powderboard, supine exercises (possibly add these to HEP); sit<>stand and gati training (may consider trial of knee immobilizer for knee stability), transfer training, w/c mobility, bed mobility training    Consulted and Agree with Plan of Care  Patient;Family member/caregiver    Family Member Consulted  husband       Patient will benefit from skilled therapeutic intervention in order to improve the following deficits and impairments:  Abnormal gait, Decreased activity tolerance, Decreased balance, Decreased cognition, Decreased knowledge of use of DME, Decreased mobility, Decreased range of motion, Decreased safety awareness, Decreased strength, Impaired sensation  Visit Diagnosis: Muscle weakness (generalized)  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 Nontraumatic subcortical hemorrhage of left cerebral hemisphere ([place]) 08/27/2019  . Acute blood loss anemia   . Hypoalbuminemia due to protein-calorie malnutrition ([place])   . Thrombocytopenia ([place])   . Dysphagia 07/29/2019  . Infarction of left basal ganglia ([place]) 07/18/2019  . Hypernatremia   . Leukocytosis   . Essential hypertension   . Global aphasia   . Cytotoxic brain edema ([place]) 07/10/2019  . ICH (intracerebral hemorrhage) ([place]) 07/09/2019  . Anxiety state 02/21/2015  . Cephalalgia 12/21/2014    Minh [place] W. 09/16/2019, 10:16 AM  Mady Haagensen, PT 09/16/19 10:19 AM Phone: [phone] Fax: [facility] [address] [place], [place], [address] Phone: [phone]   Fax:  [phone]  Name: Melanie Madden MRN: [id number] Date of Birth: 03/19/1966

## 2019-09-16 NOTE — Patient Instructions (Signed)
Work on M and H at home Use your signs [name] you may need to help)

## 2019-09-16 NOTE — Telephone Encounter (Signed)
Melanie Madden husband of patient is calling requesting somewhat of a sedative. States she is first of angry and anxiety med that she is on is not working.

## 2019-09-16 NOTE — Therapy (Signed)
[facility] [address] [place], [place], [address] Phone: [phone]   Fax:  [phone]  Speech Language Pathology Treatment  Patient Details  Name: Melanie Madden MRN: [id number] Date of Birth: 04-29-1966 Referring Provider (SLP): Alysia Penna, MD   Encounter Date: 09/15/2019  End of Session - 09/16/19 0845    Visit Number  6    Number of Visits  25    Date for SLP Re-Evaluation  12/04/19    Authorization Type  [employer]    Authorization Time Period  60 DAYS of therapy regardless of number disciplines per day    SLP Start Time  [id number]    SLP Stop Time   1400    SLP Time Calculation (min)  42 min    Activity Tolerance  Patient tolerated treatment well       Past Medical History:  Diagnosis Date  . Allergy   . Anxiety   . Arthritis   . High triglycerides   . Kidney stones   . Migraines   . Recurrent sinus infections     Past Surgical History:  Procedure Laterality Date  . ANTERIOR CRUCIATE LIGAMENT REPAIR Left   . BUNIONECTOMY Right   . LASIK    . LITHOTRIPSY    . MENISCUS REPAIR Left   . NASAL SEPTUM SURGERY    . SHOULDER SURGERY    . VARICOSE VEIN SURGERY Left     There were no vitals filed for this visit.  Subjective Assessment - 09/15/19 1330    Subjective  "She prcticed the two tones (/h/, /m/) you gave her on her own."    Patient is accompained by:  Family member   [name]   Currently in Pain?  No/denies            ADULT SLP TREATMENT - 09/16/19 0001      General Information   Behavior/Cognition  Alert;Cooperative;Distractible;Doesn't follow directions      Treatment Provided   Treatment provided  Cognitive-Linquistic      Cognitive-Linquistic Treatment   Treatment focused on  Aphasia;Apraxia    Skilled Treatment  SLP learned pt used some of the signs discussed in previous session over the weekend; Husband did not say whether pt req'd cues for correct movement (due to her  apraxia). SLP worked with pt on the phonemes /m/ and /h/, which were produced spontaneously 75% success in the first 10 minutes of therapy. SLP targeted /u/ and /i/ for 15 minutes with <5% and 0% successes respectively after max cues. SLP went back to /m/ amd /h/ with 50% success and max cues. For 10 minutes SLP targeted adding /a/ to /m/ and /h/ and this decr'd pt's success with /m/ and /h/ to the point SLP worked back on /m/ and /h/ in isolation. Success ~50%.      Assessment / Recommendations / Plan   Plan  Continue with current plan of care      Progression Toward Goals   Progression toward goals  Progressing toward goals         SLP Short Term Goals - 09/16/19 0845      SLP SHORT TERM GOAL #1   Title  pt will simultaneously with SLP produce 3 different phonemes 50% of the time over three sessions    Time  4    Period  Weeks    Status  On-going      SLP SHORT TERM GOAL #2   Title  pt will ID objects  salient to pt in f:4 80% of the time over 3 sessions    Time  4    Period  Weeks    Status  On-going      SLP SHORT TERM GOAL #3   Title  pt will write her full name on first attempt over three sessions    Time  4    Period  Weeks    Status  On-going      SLP SHORT TERM GOAL #4   Title  pt will attempt expressive communication via multimodal means during 5 sessions    Time  4    Period  Weeks    Status  On-going      SLP SHORT TERM GOAL #5   Title  pt will demonstrate undertanding of simple 1-step functional commands with 70% accuracy with multimodal cues over 3 sessions    Time  4    Period  Weeks    Status  On-going      SLP SHORT TERM GOAL #6   Title  pt will look at object or line drawing and match the correct word from f:6 words 90% success over 3 sessions    Time  4    Period  Weeks    Status  New       SLP Long Term Goals - 09/16/19 0845      SLP LONG TERM GOAL #1   Title  pt's diet will be appropriately upgraded as needed by SLP    Time  10    Period   Weeks   or 25 total visits, for all LTGs   Status  On-going      SLP LONG TERM GOAL #2   Title  pt will produce 5 simple words/words pertinent to pt (family or pet names, etc) simultaneously with SLP and multimodal cues over three sessions    Time  10    Period  Weeks    Status  On-going      SLP LONG TERM GOAL #3   Title  pt will demo understanding of simple yes/no questions pertinent to desires/wants with multimodal cues 90% of the time over 3 sessions    Time  10    Period  Weeks    Status  On-going      SLP LONG TERM GOAL #4   Title  a device trial with a speech generating device will be initiated within the first 20 ST visits    Time  10    Period  Weeks    Status  On-going      SLP LONG TERM GOAL #5   Title  pt will demo recognition of written word for a common object 100% success over 3 sessions    Time  10    Period  Weeks    Status  On-going       Plan - 09/16/19 0845    Clinical Impression Statement  Pt currently on dys III/thin diet - no complaints with this diet to SLP knowledge. Today SLP provided pt/husband/friend some ideas for using specific gestures to communicate, to practice at home. Pt presents with cognitive communication deficits in at least the areas of basic attention and basic awareness, a severe/profound expressive aphasia with verbal apraxia, and severe/profound receptive aphasia ("global aphasia"). See "skilled treatment" for more details of today's session. Pt cont to demonstrate frustration about her inability to perform certain communicative tasks - must have full pt effort for most success possible. Unsure  if pt can complete entire WAB due to frustration and/or ability level. At some point pt may benefit from a speech generating device. She would cont to benefit from skilled ST focusing on incr'ing communicative effectiveness via multimodal means, incr'ing cognition in functional ways as able given pt's language deficit, and monitoring/upgrading pt's  diet as appropriate.    Speech Therapy Frequency  2x / week    Duration  --   12 weeks or 25 visits   Treatment/Interventions  Diet toleration management by SLP;Trials of upgraded texture/liquids;Language facilitation;Internal/external aids;Cognitive reorganization;Cueing hierarchy;SLP instruction and feedback;Patient/family education;Compensatory strategies;Multimodal communcation approach;Functional tasks;Environmental controls    Potential to Achieve Goals  Fair    Potential Considerations  Severity of impairments;Cooperation/participation level       Patient will benefit from skilled therapeutic intervention in order to improve the following deficits and impairments:   Aphasia  Dysphagia, unspecified type  Verbal apraxia  Cognitive communication deficit    Problem List Patient Active Problem List   Diagnosis Date Noted  . Nontraumatic subcortical hemorrhage of left cerebral hemisphere ([place]) 08/27/2019  . Acute blood loss anemia   . Hypoalbuminemia due to protein-calorie malnutrition ([place])   . Thrombocytopenia ([place])   . Dysphagia 07/29/2019  . Infarction of left basal ganglia ([place]) 07/18/2019  . Hypernatremia   . Leukocytosis   . Essential hypertension   . Global aphasia   . Cytotoxic brain edema ([place]) 07/10/2019  . ICH (intracerebral hemorrhage) ([place]) 07/09/2019  . Anxiety state 02/21/2015  . Cephalalgia 12/21/2014    [place] ,[place], [place]  09/16/2019, 8:46 AM  [facility] [address] [place], [place], [address] Phone: [phone]   Fax:  [phone]   Name: Atziry Markey MRN: [id number] Date of Birth: [birth year]

## 2019-09-17 ENCOUNTER — Ambulatory Visit: Payer: Managed Care, Other (non HMO) | Admitting: Occupational Therapy

## 2019-09-17 ENCOUNTER — Ambulatory Visit: Payer: Managed Care, Other (non HMO) | Admitting: Physical Therapy

## 2019-09-18 ENCOUNTER — Telehealth: Payer: Self-pay

## 2019-09-18 DIAGNOSIS — I610 Nontraumatic intracerebral hemorrhage in hemisphere, subcortical: Secondary | ICD-10-CM

## 2019-09-18 NOTE — Telephone Encounter (Signed)
Okay to have a home health aide

## 2019-09-18 NOTE — Telephone Encounter (Signed)
NCM from [employer] is calling stating patient spouse Melanie Madden is requesting [facility]. [employer] uses [employer]. To request call [phone] or [email or web address].  [place] [phone] ext [phone]

## 2019-09-18 NOTE — Telephone Encounter (Signed)
Is it ok for patient to have a HH nurse aide?

## 2019-09-21 ENCOUNTER — Ambulatory Visit: Payer: Managed Care, Other (non HMO)

## 2019-09-21 ENCOUNTER — Ambulatory Visit: Payer: Managed Care, Other (non HMO) | Admitting: Physical Therapy

## 2019-09-21 ENCOUNTER — Encounter: Payer: Self-pay | Admitting: Physical Therapy

## 2019-09-21 ENCOUNTER — Other Ambulatory Visit: Payer: Self-pay

## 2019-09-21 ENCOUNTER — Ambulatory Visit: Payer: Managed Care, Other (non HMO) | Admitting: Occupational Therapy

## 2019-09-21 DIAGNOSIS — M6281 Muscle weakness (generalized): Secondary | ICD-10-CM

## 2019-09-21 DIAGNOSIS — I69151 Hemiplegia and hemiparesis following nontraumatic intracerebral hemorrhage affecting right dominant side: Secondary | ICD-10-CM

## 2019-09-21 DIAGNOSIS — R482 Apraxia: Secondary | ICD-10-CM

## 2019-09-21 DIAGNOSIS — R1310 Dysphagia, unspecified: Secondary | ICD-10-CM

## 2019-09-21 DIAGNOSIS — R2689 Other abnormalities of gait and mobility: Secondary | ICD-10-CM

## 2019-09-21 DIAGNOSIS — R4701 Aphasia: Secondary | ICD-10-CM

## 2019-09-21 DIAGNOSIS — I69153 Hemiplegia and hemiparesis following nontraumatic intracerebral hemorrhage affecting right non-dominant side: Secondary | ICD-10-CM

## 2019-09-21 DIAGNOSIS — I69219 Unspecified symptoms and signs involving cognitive functions following other nontraumatic intracranial hemorrhage: Secondary | ICD-10-CM

## 2019-09-21 DIAGNOSIS — R41841 Cognitive communication deficit: Secondary | ICD-10-CM

## 2019-09-21 DIAGNOSIS — R208 Other disturbances of skin sensation: Secondary | ICD-10-CM

## 2019-09-21 NOTE — Therapy (Signed)
[facility] [address] [place], [place], [address] Phone: [phone]   Fax:  [phone]  Occupational Therapy Treatment  Patient Details  Name: Melanie Madden MRN: [id number] Date of Birth: 1965/09/23 Referring Provider (OT): Dr. Alysia Penna   Encounter Date: 09/21/2019  OT End of Session - 09/21/19 1218    Visit Number  5    Number of Visits  25    Date for OT Re-Evaluation  12/01/19    Authorization Type  [employer] 2021:  No prior auth OT, 60 DAY visit limit (regardless of how many therapies each day).    Authorization - Visit Number  8    Authorization - Number of Visits  42    OT Start Time  1100    OT Stop Time  1145    OT Time Calculation (min)  45 min    Activity Tolerance  Patient tolerated treatment well    Behavior During Therapy  Impulsive;Agitated       Past Medical History:  Diagnosis Date  . Allergy   . Anxiety   . Arthritis   . High triglycerides   . Kidney stones   . Migraines   . Recurrent sinus infections     Past Surgical History:  Procedure Laterality Date  . ANTERIOR CRUCIATE LIGAMENT REPAIR Left   . BUNIONECTOMY Right   . LASIK    . LITHOTRIPSY    . MENISCUS REPAIR Left   . NASAL SEPTUM SURGERY    . SHOULDER SURGERY    . VARICOSE VEIN SURGERY Left     There were no vitals filed for this visit.  Subjective Assessment - 09/21/19 1103    Subjective   inconsistent responses due to aphasia    Patient is accompanied by:  Family member   husband   Pertinent History  Subcortical hemorrhage of R cerbral hemisphere.  PMH:  HTN, anxiety, migraines, hx of shoulder injection approx 2 weeks ago for R shoulder pain, hx of R shoulder surgery (arthroscopic)    Limitations  aphasia, fall risk, impulsive    Patient Stated Goals  husband reports: to be able to care for herself more (pt indicates that she onces to return to previous activities per questioning/gestures)    Currently in Pain?   Yes    Pain Score  --   unable to rate d/t aphasia   Pain Location  --   forearm/wrist   Pain Orientation  Right    Pain Type  Acute pain    Pain Frequency  Intermittent    Aggravating Factors   w/ AA/ROM in tilted stool and w/ gentle P/ROM at wrist    Pain Relieving Factors  unknown      Attempted light weight bearing RUE while cross reaching for cones LUE and then placing to Lt side. Pt only able to activate RUE minimally but did not use to push back up to place cones on Lt side. Difficulty w/ simple verbal and demo instructions d/t aphasia. Low range AA/ROM w/ max assist/facilitation using tilted stool w/ inconsistent minimal activation detected.   Practiced donning/doffing Lt shoe and sock w/ close supervision for safety. Pt able to do I'ly. Recommended having patient do as much as able at home w/ close sup to [facility] for safety. Pt will need help donning Rt shoe d/t brace, however w/ practiced could probably don/doff sock and doff shoe.  Also continued to reinforce hemi dressing techniques for shirt and bathing to increase pt's awareness  to Rt side and independence w/ basic self care needs.   Discussed possible use of estim next session - pt's husband indicates no contraindications and that they used this w/ her in hospital.                        OT Short Term Goals - 09/15/19 1452      OT SHORT TERM GOAL #1   Title  Pt/husband will be independent with initial HEP for RUE ROM--check STGs 10/14/19    Time  6    Period  Weeks    Status  On-going      OT SHORT TERM GOAL #2   Title  Pt will perform UB dressing with min A.    Time  6    Period  Weeks    Status  On-going      OT SHORT TERM GOAL #3   Title  Pt will perform UB bathing with mod A.    Time  6    Period  Weeks    Status  New      OT SHORT TERM GOAL #4   Title  Pt will be able to brush hair with set-up.    Time  6    Period  Weeks    Status  New      OT SHORT TERM GOAL #5   Title  Pt will demo  at least 110* shoulder flex PROM in supine without pain for incr ease for ADLs.    Time  6    Period  Weeks    Status  New        OT Long Term Goals - 09/02/19 1229      OT LONG TERM GOAL #1   Title  Pt/husband will be independent with HEP for functional activities for incr participation in IADLs/leisure tasks and for cognition (and incorporating RUE if able).--check LTGs 12/01/19    Time  12    Period  Weeks    Status  New      OT LONG TERM GOAL #2   Title  Pt will perform UB dressing with supervision.    Time  12    Period  Weeks    Status  New      OT LONG TERM GOAL #3   Title  Pt will perform LB dressing with mod A.    Time  12    Period  Weeks    Status  New      OT LONG TERM GOAL #4   Title  Pt will perform UB bathing with min A.    Time  12    Period  Weeks    Status  New      OT LONG TERM GOAL #5   Title  Pt will perform simple cold snack prep from w/c level with min A/set up.    Time  12    Period  Weeks    Status  New      OT LONG TERM GOAL #6   Title  Pt will perform toileting with mod A.    Time  12    Period  Weeks    Status  New            Plan - 09/21/19 1220    Clinical Impression Statement  Pt gestures that vision is impaired but unable to decipher if it is lower quadrants, entire Rt side, or just Rt lower quadrant  and unable to formally assess d/t global aphasia.    Occupational performance deficits (Please refer to evaluation for details):  ADL's;IADL's;Leisure;Work;Social Participation    Body Structure / Function / Physical Skills  ADL;ROM;IADL;Sensation;Mobility;Strength;Tone;UE functional use;Decreased knowledge of precautions;Decreased knowledge of use of DME;Coordination;Balance;FMC;GMC    Cognitive Skills  Attention;Temperament/Personality;Understand;Thought;Safety Awareness    OT Frequency  2x / week    OT Duration  12 weeks    OT Treatment/Interventions  Self-care/ADL training;Moist Heat;DME and/or AE  instruction;Splinting;Therapeutic activities;Aquatic Therapy;Cognitive remediation/compensation;Therapeutic exercise;Cryotherapy;Neuromuscular education;Functional Mobility Training;Passive range of motion;Visual/perceptual remediation/compensation;Patient/family education;Manual Therapy;Electrical Stimulation;Fluidtherapy    Plan  continue to re-inforce hemi-dressing techniques and practiced donning/doffing pull over shirt, begin resting hand splint, estim if time (no contraindications)    Consulted and Agree with Plan of Care  Patient;Family member/caregiver    Family Member Consulted  husband       Patient will benefit from skilled therapeutic intervention in order to improve the following deficits and impairments:   Body Structure / Function / Physical Skills: ADL, ROM, IADL, Sensation, Mobility, Strength, Tone, UE functional use, Decreased knowledge of precautions, Decreased knowledge of use of DME, Coordination, Balance, FMC, GMC Cognitive Skills: Attention, Temperament/Personality, Understand, Thought, Safety Awareness     Visit Diagnosis: Hemiplegia and hemiparesis following nontraumatic intracerebral hemorrhage affecting right dominant side (HCC)  Other disturbances of skin sensation  Muscle weakness (generalized)  Unspecified symptoms and signs involving cognitive functions following other nontraumatic intracranial hemorrhage    Problem List Patient Active Problem List   Diagnosis Date Noted  . Nontraumatic subcortical hemorrhage of left cerebral hemisphere ([place]) 08/27/2019  . Acute blood loss anemia   . Hypoalbuminemia due to protein-calorie malnutrition ([place])   . Thrombocytopenia ([place])   . Dysphagia 07/29/2019  . Infarction of left basal ganglia ([place]) 07/18/2019  . Hypernatremia   . Leukocytosis   . Essential hypertension   . Global aphasia   . Cytotoxic brain edema ([place]) 07/10/2019  . ICH (intracerebral hemorrhage) ([place]) 07/09/2019  . Anxiety state 02/21/2015  .  Cephalalgia 12/21/2014    Carey Bullocks, OTR/L 09/21/2019, 2:13 PM  [facility] [address] [place], [place], [address] Phone: [phone]   Fax:  [phone]  Name: Melanie Madden MRN: [id number] Date of Birth: [birth year]

## 2019-09-21 NOTE — Therapy (Signed)
[facility] [address] [place], [place], [address] Phone: [phone]   Fax:  [phone]  Speech Language Pathology Treatment  Patient Details  Name: Melanie Madden MRN: [id number] Date of Birth: 01/14/1966 Referring Provider (SLP): Alysia Penna, MD   Encounter Date: 09/21/2019  End of Session - 09/21/19 1351    Visit Number  7    Number of Visits  25    Date for SLP Re-Evaluation  12/04/19    Authorization Type  cigna    Authorization Time Period  60 DAYS of therapy regardless of number disciplines per day    SLP Start Time  1022    SLP Stop Time   1102    SLP Time Calculation (min)  40 min    Activity Tolerance  Patient tolerated treatment well       Past Medical History:  Diagnosis Date  . Allergy   . Anxiety   . Arthritis   . High triglycerides   . Kidney stones   . Migraines   . Recurrent sinus infections     Past Surgical History:  Procedure Laterality Date  . ANTERIOR CRUCIATE LIGAMENT REPAIR Left   . BUNIONECTOMY Right   . LASIK    . LITHOTRIPSY    . MENISCUS REPAIR Left   . NASAL SEPTUM SURGERY    . SHOULDER SURGERY    . VARICOSE VEIN SURGERY Left     There were no vitals filed for this visit.  Subjective Assessment - 09/21/19 1032    Subjective  "She said she can't see eople's faces from the nose down on the right side."    Patient is accompained by:  Family member            ADULT SLP TREATMENT - 09/21/19 1034      General Information   Behavior/Cognition  Alert;Cooperative;Distractible;Doesn't follow directions      Treatment Provided   Treatment provided  Cognitive-Linquistic      Cognitive-Linquistic Treatment   Treatment focused on  Aphasia;Apraxia    Skilled Treatment  Pt frantically gesturing to SLP by flipping her velcro ends of her black wrist splint - SLP wrote "pain?" and showed pt and she grunted loudly and sharply and flipped the ends again frantically.  This occurred once more and then husband stated, "maybe she wants them cut?" so SLP wrote "cut?" and asked pt if she wanted them cut - pt again flipped the ends and grunted loudly. She nodded when husband asked her if she wanted them cut, so husband cut them. "She's been kind of ornairy this morning, Glendell Docker" husband said. Targeting auditory comprehension/phoneme ID, SLP said either /h/ or /m/ and had pt choose by pointing at "H" or "M" which phoneme SLP was saying. SLP added written cues "closed lips" "open mouth", etc. for additional cues. Pt initially on first 10 reps was 50% but on last ten reps was 80% successful at [place] the phoneme sound. SLP then had pt imitate SLP production of /m/ and /h/, with 85% success overall. Some perseveration on /a/. SLP added /a/ in isolation as a third imitated sound and imitating this and /h/ pt's success was 6/8. SLP explained in multimodal means voiced and voiceless sounds and had pt put hand on thyroid for tactlie cues.       Assessment / Recommendations / Plan   Plan  Continue with current plan of care      Progression Toward Goals   Progression toward goals  Progressing toward goals   severity of deficit      SLP Education - 09/21/19 1351    Education Details  begin /a/ practice at home    Person(s) Educated  Patient;Spouse    Methods  Explanation;Demonstration;Handout    Comprehension  Need further instruction       SLP Short Term Goals - 09/21/19 1353      SLP SHORT TERM GOAL #1   Title  pt will simultaneously with SLP produce 3 different phonemes 50% of the time over three sessions    Time  3    Period  Weeks    Status  On-going      SLP SHORT TERM GOAL #2   Title  pt will ID objects salient to pt in f:4 80% of the time over 3 sessions    Time  3    Period  Weeks    Status  On-going      SLP SHORT TERM GOAL #3   Title  pt will write her full name on first attempt over three sessions    Time  3    Period  Weeks    Status  On-going       SLP SHORT TERM GOAL #4   Title  pt will attempt expressive communication via multimodal means during 5 sessions    Time  3    Period  Weeks    Status  On-going      SLP SHORT TERM GOAL #5   Title  pt will demonstrate undertanding of simple 1-step functional commands with 70% accuracy with multimodal cues over 3 sessions    Time  3    Period  Weeks    Status  On-going      SLP SHORT TERM GOAL #6   Title  pt will look at object or line drawing and match the correct word from f:6 words 90% success over 3 sessions    Time  3    Period  Weeks    Status  New       SLP Long Term Goals - 09/21/19 1353      SLP LONG TERM GOAL #1   Title  pt's diet will be appropriately upgraded as needed by SLP    Time  9    Period  Weeks   or 25 total visits, for all LTGs   Status  On-going      SLP LONG TERM GOAL #2   Title  pt will produce 5 simple words/words pertinent to pt (family or pet names, etc) simultaneously with SLP and multimodal cues over three sessions    Time  9    Period  Weeks    Status  On-going      SLP LONG TERM GOAL #3   Title  pt will demo understanding of simple yes/no questions pertinent to desires/wants with multimodal cues 90% of the time over 3 sessions    Time  9    Period  Weeks    Status  On-going      SLP LONG TERM GOAL #4   Title  a device trial with a speech generating device will be initiated within the first 20 ST visits    Time  9    Period  Weeks    Status  On-going      SLP LONG TERM GOAL #5   Title  pt will demo recognition of written word for a common object 100% success over 3 sessions  Time  9    Period  Weeks    Status  On-going       Plan - 09/21/19 1352    Clinical Impression Statement  Cont with no complaints with dys III diet/thin, to SLP knowledge. Pt cont to present with low frustration tolerance, cognitive communication deficits in at least the areas of basic attention and basic awareness, a severe/profound expressive aphasia with  verbal apraxia, and severe/profound receptive aphasia ("global aphasia"). See "skilled treatment" for more details of today's session. Pt cont to demonstrate frustration about her inability to perform certain communicative tasks - must have full pt effort for most success possible. Unsure if pt can complete entire WAB due to frustration and/or ability level. At some point pt may benefit from a speech generating device. She would cont to benefit from skilled ST focusing on incr'ing communicative effectiveness via multimodal means, incr'ing cognition in functional ways as able given pt's language deficit, and monitoring/upgrading pt's diet as appropriate.    Speech Therapy Frequency  2x / week    Duration  --   12 weeks or 25 visits   Treatment/Interventions  Diet toleration management by SLP;Trials of upgraded texture/liquids;Language facilitation;Internal/external aids;Cognitive reorganization;Cueing hierarchy;SLP instruction and feedback;Patient/family education;Compensatory strategies;Multimodal communcation approach;Functional tasks;Environmental controls    Potential to Achieve Goals  Fair    Potential Considerations  Severity of impairments;Cooperation/participation level       Patient will benefit from skilled therapeutic intervention in order to improve the following deficits and impairments:   Aphasia  Verbal apraxia  Dysphagia, unspecified type  Cognitive communication deficit    Problem List Patient Active Problem List   Diagnosis Date Noted  . Nontraumatic subcortical hemorrhage of left cerebral hemisphere ([place]) 08/27/2019  . Acute blood loss anemia   . Hypoalbuminemia due to protein-calorie malnutrition ([place])   . Thrombocytopenia ([place])   . Dysphagia 07/29/2019  . Infarction of left basal ganglia ([place]) 07/18/2019  . Hypernatremia   . Leukocytosis   . Essential hypertension   . Global aphasia   . Cytotoxic brain edema ([place]) 07/10/2019  . ICH (intracerebral hemorrhage)  ([place]) 07/09/2019  . Anxiety state 02/21/2015  . Cephalalgia 12/21/2014    [employer] ,[place], [place]  09/21/2019, 1:54 PM  [facility] [address] [place], [place], [address] Phone: [phone]   Fax:  [phone]   Name: Melanie Madden MRN: [id number] Date of Birth: Jan 16, 1966

## 2019-09-21 NOTE — Telephone Encounter (Signed)
Message from patient

## 2019-09-22 MED ORDER — QUETIAPINE FUMARATE 25 MG PO TABS: 25.0000 mg | ORAL_TABLET | Freq: Two times a day (BID) | ORAL | 1 refills | Status: DC

## 2019-09-22 NOTE — Telephone Encounter (Signed)
Please call in Seroquel 25 mg p.o. twice daily #60 1 refill

## 2019-09-22 NOTE — Telephone Encounter (Signed)
Referral submitted for home health aide/nurse.  Patient's husband notified

## 2019-09-22 NOTE — Therapy (Signed)
[facility] [address] [place], [place], [address] Phone: [phone]   Fax:  [phone]  Physical Therapy Treatment  Patient Details  Name: Melanie Madden MRN: [id number] Date of Birth: [birth year] Referring Provider (PT): Lauraine Rinne, [place]   Encounter Date: 09/21/2019  PT End of Session - 09/22/19 1542    Visit Number  6    Number of Visits  20    Date for PT Re-Evaluation  11/24/19    Authorization Type  [employer]- 60 VL combined PT/OT/ST - counts as 1 visit if seen on same day.  Not sure if 60 is hard max.  $50 copay each day - make sure PT/OT/ST on same day if possible    Authorization - Visit Number  8   PT/OT/ST days combined   Authorization - Number of Visits  20    PT Start Time  1149    PT Stop Time  1235    PT Time Calculation (min)  46 min    Equipment Utilized During Treatment  --   pt's R AFO; added forefront wedge   Activity Tolerance  Patient tolerated treatment well    Behavior During Therapy  Impulsive       Past Medical History:  Diagnosis Date  . Allergy   . Anxiety   . Arthritis   . High triglycerides   . Kidney stones   . Migraines   . Recurrent sinus infections     Past Surgical History:  Procedure Laterality Date  . ANTERIOR CRUCIATE LIGAMENT REPAIR Left   . BUNIONECTOMY Right   . LASIK    . LITHOTRIPSY    . MENISCUS REPAIR Left   . NASAL SEPTUM SURGERY    . SHOULDER SURGERY    . VARICOSE VEIN SURGERY Left     There were no vitals filed for this visit.  Subjective Assessment - 09/21/19 1151    Subjective  No pain to report.  Just finished working with OT and Speech therapy. Husband reports pt is mad because she had earlier appointments this morning.    Pertinent History  anxiety, migraines, HTN    Patient Stated Goals  Per husband report they would like her to get stronger on right, improve mobility, and work on speech.    Currently in Pain?  No/denies                        [facility] Adult PT Treatment/Exercise - 09/22/19 1516      Transfers   Transfers  Sit to Stand;Stand to Sit    Sit to Stand  3: Mod assist    Sit to Stand Details (indicate cue type and reason)  Cues for L hand to push from mat or w/c arm rest instead of pulling up on RW; therapist provided assistance to translate femur forwards during sit > stand; assistance to remove R hand from orthosis prior to sitting    Stand to Sit  3: Mod assist    Squat Pivot Transfers  4: Min assist    Squat Pivot Transfer Details (indicate cue type and reason)  mat > w/c to R side, cues for hand placement and assistance to guide hips over to R side      Ambulation/Gait   Ambulation/Gait  Yes    Ambulation/Gait Assistance  2: Max assist    Ambulation/Gait Assistance Details  Performed gait with RW with R hand orthosis around gym with 2 seated rest breaks.  Therapist provided max-total verbal and tactile cues to maintain RW closer to body to keep head and trunk aligned over BOS, for lateral weight shift to L side through L trunk elongation to allow activation of hip/knee flexion for RLE advancement without use of trunk flexion/rocking; attempted to assist pt with stability on RLE during stance phase to increase stance time and step length on LLE but pt continued to impulsively step with LLE without fully shifting COG over RLE.  Use of visual cue to initiate R swing phase.  Pt did demonstrate improved knee extension in R stance with wedge added to forefoot.    Ambulation Distance (Feet)  115 Feet    Assistive device  Rolling walker    Gait Pattern  Step-to pattern;Decreased step length - left;Decreased stance time - right;Decreased hip/knee flexion - right;Lateral hip instability;Decreased weight shift to left;Trunk flexed    Ambulation Surface  Level;Indoor    Pre-Gait Activities  Even with AFO Pt continues to demonstrate increased knee flexion in stance.  Knee flexion worsens with  increased ankle DF in stance phase.  Placed small wedge in front of shoe under AFO to promote increased hip and knee extension in stance.  (Shifts COG further posterior forcing hip extension and knee extension to bring COG back in alignment with foot.)      Wheelchair Mobility   Wheelchair Mobility  No    Wheelchair Assistance  1: +1 Total assist    Comments  At end of session husband attempted to have pt self propel to waiting area but pt refused.      Therapeutic Activites    Therapeutic Activities  Other Therapeutic Activities               PT Short Term Goals - 08/31/19 1459      PT SHORT TERM GOAL #1   Title  Pt will be able to perform initial HEP with assist of husband for improved function.    Time  4    Period  Weeks    Status  New    Target Date  09/25/19      PT SHORT TERM GOAL #2   Title  Pt will transfer w/c to/from mat with slideboard CGA with assist to set up slideboard for improved mobility.    Time  4    Period  Weeks    Status  New    Target Date  09/25/19      PT SHORT TERM GOAL #3   Title  Pt will be able to stand x 2 min mod assist with PT blocking right knee for improved standing balance.    Time  4    Period  Weeks    Status  New    Target Date  09/25/19      PT SHORT TERM GOAL #4   Title  Pt will perform stand/pivot transfer mod assist of husband to allow improved safety with transfers at home.    Time  4    Period  Weeks    Status  New    Target Date  09/25/19      PT SHORT TERM GOAL #5   Title  Pt will perform all bed mobility min assist or less for improved mobility.    Time  4    Period  Weeks    Status  New    Target Date  09/25/19      PT SHORT TERM GOAL #6   Title  Pt will  propel w/c  100' supervision for improved mobility in home.    Time  4    Period  Weeks    Status  New    Target Date  09/25/19        PT Long Term Goals - 08/26/19 0841      PT LONG TERM GOAL #1   Title  Pt will be able to perform progressive HEP  for strengthening, balance and mobility with assist of husband to continued gains at home.    Time  10    Period  Weeks    Status  New    Target Date  11/04/19      PT LONG TERM GOAL #2   Title  Pt will be able to perform all transfers min assist for improved safety with mobility.    Time  10    Period  Weeks    Status  New    Target Date  11/04/19      PT LONG TERM GOAL #3   Title  Pt will be able to ambulate 10' with max assist for improved access to bathroom at home with husband with appropriate AD.    Time  10    Period  Weeks    Status  New    Target Date  11/04/19            Plan - 09/22/19 1544    Clinical Impression Statement  Utilized forefoot wedge to promote greater tibial extension and knee/hip extension during stance; performed gait around gym with RW and R hand orthosis with max A and facilitation from therapist for sequencing, weight shifting and initiation of R swing phase.  Pt still demonstrates limited weight acceptance on RLE during stance limiting step length of LLE.  Will continue to address in order to progress.    Personal Factors and Comorbidities  Comorbidity 3+    Comorbidities  anxiety, migraines, HTN    Examination-Activity Limitations  Bathing;Bed Mobility;Stand;Locomotion Level;Squat;Stairs;Dressing;Transfers    Examination-Participation Restrictions  Community Activity;Driving;Shop;Laundry;Meal Prep    Stability/Clinical Decision Making  Evolving/Moderate complexity    Rehab Potential  Fair    PT Frequency  2x / week    PT Duration  --   10 weeks   PT Treatment/Interventions  ADLs/Self Care Home Management;Electrical Stimulation;DME Instruction;Gait training;Stair training;Functional mobility training;Therapeutic activities;Therapeutic exercise;Orthotic Fit/Training;Patient/family education;Neuromuscular re-education;Balance training;Manual techniques;Passive range of motion;Vestibular;Wheelchair mobility training    PT Next Visit Plan  Assess  vitals prior to therapy.  Check STG - Tape wedge to front of shoe to promote knee extension; Seated weight shifting to L, sit > stand on Steady reaching up and to the L.  Mirror therapy - although may be confusing or upsetting due to visual impairment in R lower quadrant?  Use of powderboard, supine exercises (possibly add these to HEP); sit<>stand, transfer training, w/c mobility, bed mobility training    Consulted and Agree with Plan of Care  Patient;Family member/caregiver    Family Member Consulted  husband       Patient will benefit from skilled therapeutic intervention in order to improve the following deficits and impairments:  Abnormal gait, Decreased activity tolerance, Decreased balance, Decreased cognition, Decreased knowledge of use of DME, Decreased mobility, Decreased range of motion, Decreased safety awareness, Decreased strength, Impaired sensation  Visit Diagnosis: Muscle weakness (generalized)  Other abnormalities of gait and mobility  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 Nontraumatic subcortical hemorrhage of left cerebral hemisphere ([place]) 08/27/2019  . Acute blood loss anemia   . Hypoalbuminemia due to protein-calorie malnutrition ([place])   . Thrombocytopenia ([place])   . Dysphagia 07/29/2019  . Infarction of left basal ganglia ([place]) 07/18/2019  . Hypernatremia   . Leukocytosis   . Essential hypertension   . Global aphasia   . Cytotoxic brain edema ([place]) 07/10/2019  . ICH (intracerebral hemorrhage) ([place]) 07/09/2019  . Anxiety state 02/21/2015  . Cephalalgia 12/21/2014    Rico Junker, PT, DPT 09/22/19    3:55 PM    [facility] [address] [place], [place], [address] Phone: [phone]   Fax:  [phone]  Name: Melanie Madden MRN: [id number] Date  of Birth: 1966-07-27

## 2019-09-23 ENCOUNTER — Ambulatory Visit: Payer: Managed Care, Other (non HMO) | Admitting: Speech Pathology

## 2019-09-23 ENCOUNTER — Encounter: Payer: Self-pay | Admitting: Occupational Therapy

## 2019-09-23 ENCOUNTER — Encounter: Payer: Self-pay | Admitting: Speech Pathology

## 2019-09-23 ENCOUNTER — Ambulatory Visit: Payer: Managed Care, Other (non HMO) | Admitting: Occupational Therapy

## 2019-09-23 ENCOUNTER — Other Ambulatory Visit: Payer: Self-pay

## 2019-09-23 ENCOUNTER — Ambulatory Visit: Payer: Managed Care, Other (non HMO) | Admitting: Physical Therapy

## 2019-09-23 VITALS — BP 155/91 | HR 105

## 2019-09-23 DIAGNOSIS — R482 Apraxia: Secondary | ICD-10-CM

## 2019-09-23 DIAGNOSIS — R4701 Aphasia: Secondary | ICD-10-CM

## 2019-09-23 DIAGNOSIS — M6281 Muscle weakness (generalized): Secondary | ICD-10-CM | POA: Diagnosis not present

## 2019-09-23 DIAGNOSIS — I69219 Unspecified symptoms and signs involving cognitive functions following other nontraumatic intracranial hemorrhage: Secondary | ICD-10-CM

## 2019-09-23 DIAGNOSIS — R2689 Other abnormalities of gait and mobility: Secondary | ICD-10-CM

## 2019-09-23 DIAGNOSIS — I69151 Hemiplegia and hemiparesis following nontraumatic intracerebral hemorrhage affecting right dominant side: Secondary | ICD-10-CM

## 2019-09-23 DIAGNOSIS — R208 Other disturbances of skin sensation: Secondary | ICD-10-CM

## 2019-09-23 DIAGNOSIS — I69153 Hemiplegia and hemiparesis following nontraumatic intracerebral hemorrhage affecting right non-dominant side: Secondary | ICD-10-CM

## 2019-09-23 NOTE — Therapy (Signed)
[facility] [address] [place], [place], [address] Phone: [phone]   Fax:  [phone]  Physical Therapy Treatment  Patient Details  Name: Melanie Madden MRN: [id number] Date of Birth: 08/26/1965 Referring Provider (PT): Lauraine Rinne, [place]   Encounter Date: 09/23/2019  PT End of Session - 09/23/19 1219    Visit Number  7    Number of Visits  20    Date for PT Re-Evaluation  11/24/19    Authorization Type  [employer]- 60 VL combined PT/OT/ST - counts as 1 visit if seen on same day.  Not sure if 60 is hard max.  $50 copay each day - make sure PT/OT/ST on same day if possible    Authorization - Visit Number  9   PT/OT/ST days combined   Authorization - Number of Visits  20    PT Start Time  0935    PT Stop Time  1020    PT Time Calculation (min)  45 min    Equipment Utilized During Treatment  --   pt's R AFO; added forefront wedge   Activity Tolerance  Patient tolerated treatment well    Behavior During Therapy  Impulsive       Past Medical History:  Diagnosis Date  . Allergy   . Anxiety   . Arthritis   . High triglycerides   . Kidney stones   . Migraines   . Recurrent sinus infections     Past Surgical History:  Procedure Laterality Date  . ANTERIOR CRUCIATE LIGAMENT REPAIR Left   . BUNIONECTOMY Right   . LASIK    . LITHOTRIPSY    . MENISCUS REPAIR Left   . NASAL SEPTUM SURGERY    . SHOULDER SURGERY    . VARICOSE VEIN SURGERY Left     Vitals:   09/23/19 0942  BP: (!) 155/91  Pulse: (!) 105    Subjective Assessment - 09/23/19 1159    Subjective  Husband reports he bought a rolling walker, asking about where to get a hand orthosis.  Discussed with husband that therapy would provide a hand orthosis once pt is safe to ambulate at home with husband.    Pertinent History  anxiety, migraines, HTN    Patient Stated Goals  Per husband report they would like her to get stronger on right, improve mobility,  and work on speech.    Currently in Pain?  No/denies                       [facility] Adult PT Treatment/Exercise - 09/23/19 1201      Transfers   Transfers  Sit to Stand;Stand to [employer] Transfers    Sit to Stand  4: Min assist;3: Mod assist    Sit to Stand Details (indicate cue type and reason)  From w/c for stand pivot to mat-Mod A for support, to maintain placement and stability of RLE to allow WB through RLE and pivoting of LLE.  From mat pt only required min A to stand with pt demonstrating improved forward lean in midline and increased use of RLE during sit > stand    Stand to Sit  4: Min assist    Stand Pivot Transfers  3: Mod assist    Stand Pivot Transfer Details (indicate cue type and reason)  Therapist stabilized RLE to allow WB in order to pivot with LLE      Ambulation/Gait   Ambulation/Gait  Yes  Ambulation/Gait Assistance  3: Mod assist    Ambulation/Gait Assistance Details  with RW and hand orthosis.  Pt did not require visual target to initiate R swing phase.  Demonstrated improved upright trunk and decreased use of trunk flexion <> extension to advance RLE.  Pt also demonstrated slightly increased stance time on RLE to allow increased step length LLE; with longer L step length pt able to initiate and complete swing phase of RLE    Ambulation Distance (Feet)  115 Feet    Assistive device  Rolling walker    Gait Pattern  Step-to pattern;Step-through pattern;Decreased step length - left;Decreased stance time - right;Decreased hip/knee flexion - right;Decreased dorsiflexion - right;Right flexed knee in stance    Ambulation Surface  Level;Indoor      Neuro Re-ed    Neuro Re-ed Details   Seated on mat first on solid surface with R hip wedged to promote R trunk shortening - performed 8 reps LLE taps to tall yoga block with therapist providing cues for pt to maintain upright trunk in midline, performed 8 more reps without UE support on L.  Raised mat to perched  sitting and performed 8 more reps taps without UE support.  Progressed by having pt sit on balance disc and performed 8 reps yoga block taps with and then without LUE support - still with therapist's assistance and cues to maintain upright trunk in midline, weight shift to R, for slower, more controlled movement and to hold position prior to bringing foot back down.  Also assisted with maintaining RLE position and WB through RLE.  Changed to sitting on balance disc focusing on RLE activation and active weight shift to L through pelvis and trunk shortening on R, elongation on L.  Pt cued to reach with LUE forwards, up and to the L to facilitate weight shift and then perform trunk rotation to place cone beside R hip - also cued to keep gaze on cone and to turn head and gaze to R.  Transitioned to sit > stand from mat with therapist providing support/stability at RLE while pt performed reaching up and to the L to retrieve cone and return to sitting in controlled manner - performed 8 reps each.             PT Education - 09/23/19 1218    Education Details  reinforced that pt is not safe yet to walk at home with RW; will provide hand orthosis when pt is ready    Person(s) Educated  Patient;Spouse    Methods  Explanation    Comprehension  Verbalized understanding       PT Short Term Goals - 08/31/19 1459      PT SHORT TERM GOAL #1   Title  Pt will be able to perform initial HEP with assist of husband for improved function.    Time  4    Period  Weeks    Status  New    Target Date  09/25/19      PT SHORT TERM GOAL #2   Title  Pt will transfer w/c to/from mat with slideboard CGA with assist to set up slideboard for improved mobility.    Time  4    Period  Weeks    Status  New    Target Date  09/25/19      PT SHORT TERM GOAL #3   Title  Pt will be able to stand x 2 min mod assist with PT blocking right knee for  improved standing balance.    Time  4    Period  Weeks    Status  New     Target Date  09/25/19      PT SHORT TERM GOAL #4   Title  Pt will perform stand/pivot transfer mod assist of husband to allow improved safety with transfers at home.    Time  4    Period  Weeks    Status  New    Target Date  09/25/19      PT SHORT TERM GOAL #5   Title  Pt will perform all bed mobility min assist or less for improved mobility.    Time  4    Period  Weeks    Status  New    Target Date  09/25/19      PT SHORT TERM GOAL #6   Title  Pt will propel w/c  100' supervision for improved mobility in home.    Time  4    Period  Weeks    Status  New    Target Date  09/25/19        PT Long Term Goals - 08/26/19 0841      PT LONG TERM GOAL #1   Title  Pt will be able to perform progressive HEP for strengthening, balance and mobility with assist of husband to continued gains at home.    Time  10    Period  Weeks    Status  New    Target Date  11/04/19      PT LONG TERM GOAL #2   Title  Pt will be able to perform all transfers min assist for improved safety with mobility.    Time  10    Period  Weeks    Status  New    Target Date  11/04/19      PT LONG TERM GOAL #3   Title  Pt will be able to ambulate 10' with max assist for improved access to bathroom at home with husband with appropriate AD.    Time  10    Period  Weeks    Status  New    Target Date  11/04/19            Plan - 09/23/19 1220    Clinical Impression Statement  Began session with NMR to focus on active weight shifting and proximal stability while performing LLE movement and reaching to increase activation on R side.  Pt demonstrated good carryover of trunk control and weight shifting to gait and demonstrated improved upright trunk control, increased R stance phase with increased L step length which allowed for improved initiation of R swing phase.  Will continue to progress towards LTG.    Personal Factors and Comorbidities  Comorbidity 3+    Comorbidities  anxiety, migraines, HTN     Examination-Activity Limitations  Bathing;Bed Mobility;Stand;Locomotion Level;Squat;Stairs;Dressing;Transfers    Examination-Participation Restrictions  Community Activity;Driving;Shop;Laundry;Meal Prep    Stability/Clinical Decision Making  Evolving/Moderate complexity    Rehab Potential  Fair    PT Frequency  2x / week    PT Duration  --   10 weeks   PT Treatment/Interventions  ADLs/Self Care Home Management;Electrical Stimulation;DME Instruction;Gait training;Stair training;Functional mobility training;Therapeutic activities;Therapeutic exercise;Orthotic Fit/Training;Patient/family education;Neuromuscular re-education;Balance training;Manual techniques;Passive range of motion;Vestibular;Wheelchair mobility training    PT Next Visit Plan  Assess vitals prior to therapy.  CHECK STG - Tape wedge to front of shoe to promote knee extension; Seated weight shifting to  L, sit > stand on Steady reaching up and to the L.  Mirror therapy - although may be confusing or upsetting due to visual impairment in R lower quadrant?  Use of powderboard, supine exercises (possibly add these to HEP); sit<>stand, transfer training, w/c mobility, bed mobility training    Consulted and Agree with Plan of Care  Patient;Family member/caregiver    Family Member Consulted  husband       Patient will benefit from skilled therapeutic intervention in order to improve the following deficits and impairments:  Abnormal gait, Decreased activity tolerance, Decreased balance, Decreased cognition, Decreased knowledge of use of DME, Decreased mobility, Decreased range of motion, Decreased safety awareness, Decreased strength, Impaired sensation  Visit Diagnosis: Muscle weakness (generalized)  Other abnormalities of gait and mobility  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  Nontraumatic subcortical hemorrhage of left cerebral hemisphere ([place]) 08/27/2019  . Acute blood loss anemia   . Hypoalbuminemia due to protein-calorie malnutrition ([place])   . Thrombocytopenia ([place])   . Dysphagia 07/29/2019  . Infarction of left basal ganglia ([place]) 07/18/2019  . Hypernatremia   . Leukocytosis   . Essential hypertension   . Global aphasia   . Cytotoxic brain edema ([place]) 07/10/2019  . ICH (intracerebral hemorrhage) ([place]) 07/09/2019  . Anxiety state 02/21/2015  . Cephalalgia 12/21/2014    Rico Junker, PT, DPT 09/23/19    12:25 PM    [facility] [address] [place], [place], [address] Phone: [phone]   Fax:  [phone]  Name: Melanie Madden MRN: [id number] Date of Birth: 1966/07/17

## 2019-09-23 NOTE — Telephone Encounter (Signed)
Melanie Madden has put an order in for [facility] and aide and called in Seroquel per Dr. Letta Pate. Husband notified.

## 2019-09-23 NOTE — Therapy (Signed)
[facility] [address] [place], [place], [address] Phone: [phone]   Fax:  [phone]  Occupational Therapy Treatment  Patient Details  Name: Melanie Madden MRN: [id number] Date of Birth: 1966/04/12 Referring Provider (OT): Dr. Alysia Penna   Encounter Date: 09/23/2019  OT End of Session - 09/23/19 1012    Visit Number  6    Number of Visits  25    Date for OT Re-Evaluation  12/01/19    Authorization Type  [employer] 2021:  No prior auth OT, 60 DAY visit limit (regardless of how many therapies each day).    Authorization - Visit Number  9    Authorization - Number of Visits  64    OT Start Time  1017    OT Stop Time  1105    OT Time Calculation (min)  48 min    Activity Tolerance  Patient tolerated treatment well    Behavior During Therapy  Impulsive       Past Medical History:  Diagnosis Date  . Allergy   . Anxiety   . Arthritis   . High triglycerides   . Kidney stones   . Migraines   . Recurrent sinus infections     Past Surgical History:  Procedure Laterality Date  . ANTERIOR CRUCIATE LIGAMENT REPAIR Left   . BUNIONECTOMY Right   . LASIK    . LITHOTRIPSY    . MENISCUS REPAIR Left   . NASAL SEPTUM SURGERY    . SHOULDER SURGERY    . VARICOSE VEIN SURGERY Left     There were no vitals filed for this visit.  Subjective Assessment - 09/23/19 1011    Subjective   inconsistent responses due to aphasia    Patient is accompanied by:  Family member   husband   Pertinent History  Subcortical hemorrhage of R cerbral hemisphere.  PMH:  HTN, anxiety, migraines, hx of shoulder injection approx 2 weeks ago for R shoulder pain, hx of R shoulder surgery (arthroscopic)    Limitations  aphasia, fall risk, impulsive    Patient Stated Goals  husband reports: to be able to care for herself more (pt indicates that she onces to return to previous activities per questioning/gestures)    Currently in Pain?  No/denies    Inconsistent reports (unable to describe or rate due to aphasia, but appears to be positioning related)         Sitting>supine with min A for RLE management, but noted impulsivity.   Supine, attempted AAROM in ER/IR, elbow flex, and attempting to stabilize RUE in various locations, but pt unable.  Inconsistent shoulder contraction felt with attempts.  Reviewed current HEP (from hospital) with pt/husband (shoulder flex self PROM in supine, table slides, "chops" for elbow flex/ext self ROM, finger flex/ext, thumb adduction/abduction, wrist flex/ext, supination/pronation).  Pt performed self PROM shoulder flex in supine with min-mod cueing for positioning and timing (due to impulsivity).  Then, body on arm movements/wt. Bearing through R side for partial roll with LUE reaching with min cueing/facilitation.  Progressed to roll to R for Supine>sitting with mod A/cues for normal movement patterns.  Encouraged pt/husband to roll to R at home for incr R side activation/awareness.  Discussed progress with ADLs and goals.  Husband reports that pt able to don jacket mod I today, pull-over shirt with min A (cues to initiate putting RUE in first/to get started).  Brushing teeth with set-up.  Husband reports that he is doing almost all  bathing.  Recommended that pt do as much as possible with husband providing CGA/close supervision.  Recommended long-handled sponge to assist with washing back and LEs without leaning.  Husband verbalized understanding.  Also educated pt/husband on importance of pt attempting to wash, dress, apply lotion, etc on R side for incr awareness/attention and neuro re-ed.  Pt/husband instructed to allow/encourage pt to brush hair as much as possible while looking in mirror to incr awareness of R side and to compensate for visual-perceptual deficits.  Pt/husband verbalized understanding.  Sitting, light wt bearing with body on arm movements to incr activation RUE with max assist/facilitation  (inconsistent) as pt with difficulty with grading trunk movement and due to aphasia (visual, verbal, tactile cues given for normal movement patterns).   NMES x31min to R wrist/finger extensors, 50pps, 250 pulse width, 10sec cycle, 2sec ramp, intensity=21 with cueing given for attempts for incr muscle activitation.  Followed by attempts to "grasp" hat with inconsistent activation with thumb flex.    Noted incr muscle tone in R hand and shoulder after wt. Bearing and NMES.    Pt concerned (communicated with gestures and husband assist) about when she will be able to move RUE/RLE.  Provided encouragement regarding progress and reviewed importance of ROM and incr participation in ADLs as able to help with progress.     OT Short Term Goals - 09/23/19 1025      OT SHORT TERM GOAL #1   Title  Pt/husband will be independent with initial HEP for RUE ROM--check STGs 10/14/19    Time  6    Period  Weeks    Status  On-going      OT SHORT TERM GOAL #2   Title  Pt will perform UB dressing with min A.    Time  6    Period  Weeks    Status  New      OT SHORT TERM GOAL #3   Title  Pt will perform UB bathing with mod A.    Time  6    Period  Weeks    Status  New      OT SHORT TERM GOAL #4   Title  Pt will be able to brush hair with set-up.    Time  6    Period  Weeks    Status  New      OT SHORT TERM GOAL #5   Title  Pt will demo at least 110* shoulder flex PROM in supine without pain for incr ease for ADLs.    Time  6    Period  Weeks    Status  New        OT Long Term Goals - 09/02/19 1229      OT LONG TERM GOAL #1   Title  Pt/husband will be independent with HEP for functional activities for incr participation in IADLs/leisure tasks and for cognition (and incorporating RUE if able).--check LTGs 12/01/19    Time  12    Period  Weeks    Status  New      OT LONG TERM GOAL #2   Title  Pt will perform UB dressing with supervision.    Time  12    Period  Weeks    Status  New       OT LONG TERM GOAL #3   Title  Pt will perform LB dressing with mod A.    Time  12    Period  Weeks  Status  New      OT LONG TERM GOAL #4   Title  Pt will perform UB bathing with min A.    Time  12    Period  Weeks    Status  New      OT LONG TERM GOAL #5   Title  Pt will perform simple cold snack prep from w/c level with min A/set up.    Time  12    Period  Weeks    Status  New      OT LONG TERM GOAL #6   Title  Pt will perform toileting with mod A.    Time  12    Period  Weeks    Status  New            Plan - 09/23/19 1012    Clinical Impression Statement  Pt is progressing with improved ADL performance per husband report.  Pt continue to demo impulsivity, but is demo incr ability to manage RUE/RLE with decr assist and cueing.   Pt also demo improved trunk control.    Occupational performance deficits (Please refer to evaluation for details):  ADL's;IADL's;Leisure;Work;Social Participation    Body Structure / Function / Physical Skills  ADL;ROM;IADL;Sensation;Mobility;Strength;Tone;UE functional use;Decreased knowledge of precautions;Decreased knowledge of use of DME;Coordination;Balance;FMC;GMC    Cognitive Skills  Attention;Temperament/Personality;Understand;Thought;Safety Awareness    OT Frequency  2x / week    OT Duration  12 weeks    OT Treatment/Interventions  Self-care/ADL training;Moist Heat;DME and/or AE instruction;Splinting;Therapeutic activities;Aquatic Therapy;Cognitive remediation/compensation;Therapeutic exercise;Cryotherapy;Neuromuscular education;Functional Mobility Training;Passive range of motion;Visual/perceptual remediation/compensation;Patient/family education;Manual Therapy;Electrical Stimulation;Fluidtherapy    Plan  continue to address ADLs, begin resting hand splint, estim to triceps with neuro re-ed if time (no contraindications)    Consulted and Agree with Plan of Care  Patient;Family member/caregiver    Family Member Consulted  husband        Patient will benefit from skilled therapeutic intervention in order to improve the following deficits and impairments:   Body Structure / Function / Physical Skills: ADL, ROM, IADL, Sensation, Mobility, Strength, Tone, UE functional use, Decreased knowledge of precautions, Decreased knowledge of use of DME, Coordination, Balance, FMC, GMC Cognitive Skills: Attention, Temperament/Personality, Understand, Thought, Safety Awareness     Visit Diagnosis: Hemiplegia and hemiparesis following nontraumatic intracerebral hemorrhage affecting right dominant side (HCC)  Other disturbances of skin sensation  Unspecified symptoms and signs involving cognitive functions following other nontraumatic intracranial hemorrhage  Other abnormalities of gait and mobility    Problem List Patient Active Problem List   Diagnosis Date Noted  . Nontraumatic subcortical hemorrhage of left cerebral hemisphere ([place]) 08/27/2019  . Acute blood loss anemia   . Hypoalbuminemia due to protein-calorie malnutrition ([place])   . Thrombocytopenia ([place])   . Dysphagia 07/29/2019  . Infarction of left basal ganglia ([place]) 07/18/2019  . Hypernatremia   . Leukocytosis   . Essential hypertension   . Global aphasia   . Cytotoxic brain edema ([place]) 07/10/2019  . ICH (intracerebral hemorrhage) ([place]) 07/09/2019  . Anxiety state 02/21/2015  . Cephalalgia 12/21/2014    [facility] 09/23/2019, 11:55 AM  [facility] [address] [place], [place], [address] Phone: [phone]   Fax:  [phone]  Name: Melanie Madden MRN: [id number] Date of Birth: [birth year]   Vianne Bulls, OTR/L [facility] [address] [place], [place]  [address] [phone] phone [phone] 09/23/19 11:55 AM

## 2019-09-23 NOTE — Therapy (Signed)
[facility] [address] [place], [place], [address] Phone: [phone]   Fax:  [phone]  Speech Language Pathology Treatment  Patient Details  Name: Melanie Madden MRN: [id number] Date of Birth: 01/06/1966 Referring Provider (SLP): Alysia Penna, MD   Encounter Date: 09/23/2019  End of Session - 09/23/19 1608    Visit Number  8    Number of Visits  25    Date for SLP Re-Evaluation  12/04/19    Authorization Type  cigna    Authorization Time Period  33 DAYS of therapy regardless of number disciplines per day    SLP Start Time  [phone]    SLP Stop Time   0930    SLP Time Calculation (min)  43 min    Activity Tolerance  Patient tolerated treatment well       Past Medical History:  Diagnosis Date  . Allergy   . Anxiety   . Arthritis   . High triglycerides   . Kidney stones   . Migraines   . Recurrent sinus infections     Past Surgical History:  Procedure Laterality Date  . ANTERIOR CRUCIATE LIGAMENT REPAIR Left   . BUNIONECTOMY Right   . LASIK    . LITHOTRIPSY    . MENISCUS REPAIR Left   . NASAL SEPTUM SURGERY    . SHOULDER SURGERY    . VARICOSE VEIN SURGERY Left     There were no vitals filed for this visit.  Subjective Assessment - 09/23/19 1457    Subjective  "I have been using the white board at home - it is very helpful"    Patient is accompained by:  Family member   spouse   Currently in Pain?  No/denies            ADULT SLP TREATMENT - 09/23/19 1457      General Information   Behavior/Cognition  Alert;Cooperative;Distractible;Doesn't follow directions  (Pended)       Treatment Provided   Treatment provided  Cognitive-Linquistic  (Pended)       Cognitive-Linquistic Treatment   Treatment focused on  Aphasia;Apraxia;Patient/family/caregiver education  (Pended)     Skilled Treatment  Targeted phonemes "h" and "m" in cvcv syllables with /a/ pt repeated syllables 3-4x in a row. When  vowel change to /i/ or /o/, pt unable to produce vowles with consistent max A. Ongoing training of gestures for: hair brush, toothbrush, paint nails, cold, hot, pluck, toilet, spice, lights, dring, eat, wash sleep and tissue. Pt imitated these with usual mod A and extended time for accuracy of hand posture and placement. After gestures trained, I used written word and spoken word to cue each gesture. Melanie Madden required frequent modeling to produce accurage gesture. Instructed spouse to practice the gestures in context at home, while they are using the word/object. Melanie Madden became excited with rapid gestures when I asked about her jewelry. Spuouse says she has a lot of jewelry, I suggested letting her take out her jewelry and use written and spoken words to facilitate comprehension.   (Pended)       Assessment / Recommendations / Plan   Plan  Continue with current plan of care  (Pended)       Progression Toward Goals   Progression toward goals  Progressing toward goals  Melanie Madden)        SLP Education - 09/23/19 1603    Education Details  practice gestures in context of ADL's    Person(s) Educated  Patient;Spouse  Methods  Explanation;Demonstration;Verbal cues;Handout    Comprehension  Need further instruction;Returned demonstration       SLP Short Term Goals - 09/23/19 1608      SLP SHORT TERM GOAL #1   Title  pt will simultaneously with SLP produce 3 different phonemes 50% of the time over three sessions    Time  3    Period  Weeks    Status  On-going      SLP SHORT TERM GOAL #2   Title  pt will ID objects salient to pt in f:4 80% of the time over 3 sessions    Time  3    Period  Weeks    Status  On-going      SLP SHORT TERM GOAL #3   Title  pt will write her full name on first attempt over three sessions    Time  3    Period  Weeks    Status  On-going      SLP SHORT TERM GOAL #4   Title  pt will attempt expressive communication via multimodal means during 5 sessions    Time  3     Period  Weeks    Status  On-going      SLP SHORT TERM GOAL #5   Title  pt will demonstrate undertanding of simple 1-step functional commands with 70% accuracy with multimodal cues over 3 sessions    Time  3    Period  Weeks    Status  On-going      SLP SHORT TERM GOAL #6   Title  pt will look at object or line drawing and match the correct word from f:6 words 90% success over 3 sessions    Time  3    Period  Weeks    Status  New       SLP Long Term Goals - 09/23/19 1608      SLP LONG TERM GOAL #1   Title  pt's diet will be appropriately upgraded as needed by SLP    Time  9    Period  Weeks   or 25 total visits, for all LTGs   Status  On-going      SLP LONG TERM GOAL #2   Title  pt will produce 5 simple words/words pertinent to pt (family or pet names, etc) simultaneously with SLP and multimodal cues over three sessions    Time  9    Period  Weeks    Status  On-going      SLP LONG TERM GOAL #3   Title  pt will demo understanding of simple yes/no questions pertinent to desires/wants with multimodal cues 90% of the time over 3 sessions    Time  9    Period  Weeks    Status  On-going      SLP LONG TERM GOAL #4   Title  a device trial with a speech generating device will be initiated within the first 20 ST visits    Time  9    Period  Weeks    Status  On-going      SLP LONG TERM GOAL #5   Title  pt will demo recognition of written word for a common object 100% success over 3 sessions    Time  9    Period  Weeks    Status  On-going       Plan - 09/23/19 1604    Clinical Impression Statement  Cont with  no complaints with dys III diet/thin, to SLP knowledge. Pt cont to present with low frustration tolerance, cognitive communication deficits in at least the areas of basic attention and basic awareness, a severe/profound expressive aphasia with verbal apraxia, and severe/profound receptive aphasia ("global aphasia"). See "skilled treatment" for more details of today's  session. Pt cont to demonstrate frustration about her inability to perform certain communicative tasks - must have full pt effort for most success possible. Unsure if pt can complete entire WAB due to frustration and/or ability level. At some point pt may benefit from a speech generating device. She would cont to benefit from skilled ST focusing on incr'ing communicative effectiveness via multimodal means, incr'ing cognition in functional ways as able given pt's language deficit, and monitoring/upgrading pt's diet as appropriate.    Speech Therapy Frequency  2x / week    Duration  --   8 weeks   Treatment/Interventions  Diet toleration management by SLP;Trials of upgraded texture/liquids;Language facilitation;Internal/external aids;Cognitive reorganization;Cueing hierarchy;SLP instruction and feedback;Patient/family education;Compensatory strategies;Multimodal communcation approach;Functional tasks;Environmental controls    Potential to Achieve Goals  Fair    Potential Considerations  Severity of impairments;Cooperation/participation level       Patient will benefit from skilled therapeutic intervention in order to improve the following deficits and impairments:   Aphasia  Verbal apraxia    Problem List Patient Active Problem List   Diagnosis Date Noted  . Nontraumatic subcortical hemorrhage of left cerebral hemisphere ([place]) 08/27/2019  . Acute blood loss anemia   . Hypoalbuminemia due to protein-calorie malnutrition ([place])   . Thrombocytopenia ([place])   . Dysphagia 07/29/2019  . Infarction of left basal ganglia ([place]) 07/18/2019  . Hypernatremia   . Leukocytosis   . Essential hypertension   . Global aphasia   . Cytotoxic brain edema ([place]) 07/10/2019  . ICH (intracerebral hemorrhage) ([place]) 07/09/2019  . Anxiety state 02/21/2015  . Cephalalgia 12/21/2014    Melanie Madden, Annye Rusk MS, CCC-SLP 09/23/2019, 4:09 PM  [facility] [address] [place], [place], [address] Phone: [phone]   Fax:  [phone]   Name: Melanie Madden MRN: [id number] Date of Birth: [birth year]

## 2019-09-25 ENCOUNTER — Encounter: Payer: Self-pay | Admitting: Physical Medicine & Rehabilitation

## 2019-09-25 ENCOUNTER — Encounter
Payer: Managed Care, Other (non HMO) | Attending: Physical Medicine & Rehabilitation | Admitting: Physical Medicine & Rehabilitation

## 2019-09-25 ENCOUNTER — Other Ambulatory Visit: Payer: Self-pay

## 2019-09-25 VITALS — BP 144/88 | HR 99 | Temp 97.7°F

## 2019-09-25 DIAGNOSIS — G8101 Flaccid hemiplegia affecting right dominant side: Secondary | ICD-10-CM

## 2019-09-25 DIAGNOSIS — R4701 Aphasia: Secondary | ICD-10-CM | POA: Diagnosis not present

## 2019-09-25 DIAGNOSIS — I610 Nontraumatic intracerebral hemorrhage in hemisphere, subcortical: Secondary | ICD-10-CM

## 2019-09-25 NOTE — Progress Notes (Signed)
Subjective:    Patient ID: Melanie Madden, female    DOB: 03-01-1966, 54 y.o.   MRN: [id number]  HPI  54 year old female who is previously in good health other than hypertension that developed a large left lentiform nucleus hemorrhage on 07/11/2019. She developed global aphasia and severe right hemiparesis.  She continues to exhibit severe motor deficits as well as persistent global aphasia.   The patient has had some bouts of agitation which have improved on Seroquel 25 mg twice daily. She has followed up with her primary care on elevated blood pressure and her blood pressure today is well controlled. She is receiving outpatient PT OT speech.  She is ambulating with moderate assistance using an AFO and hemiwalker approximately 100 feet. She requires assistance with bathing and dressing.  She is able to self feed.  She is mainly wheelchair-bound at home Pain Inventory Average Pain 0 Pain Right Now 0 My pain is na  In the last 24 hours, has pain interfered with the following? General activity 0 Relation with others 0 Enjoyment of life 0 What TIME of day is your pain at its worst? varies Sleep (in general) Good  Pain is worse with: na Pain improves with: na Relief from Meds: na  Mobility use a wheelchair needs help with transfers  Function disabled: date disabled . I need assistance with the following:  feeding, dressing, bathing, toileting, meal prep, household duties and shopping  Neuro/Psych weakness anxiety  Prior Studies Any changes since last visit?  no  Physicians involved in your care Primary care .   Family History  Problem Relation Age of Onset  . Alzheimer's disease Mother   . Hypertension Mother   . Diabetes Mother   . Thyroid disease Mother   . Deep vein thrombosis Maternal Grandmother   . Colon cancer Neg Hx   . Colon polyps Neg Hx   . Esophageal cancer Neg Hx   . Pancreatic cancer Neg Hx   . Rectal cancer Neg Hx   . Stomach cancer Neg Hx   .  Breast cancer Neg Hx    Social History   Socioeconomic History  . Marital status: Significant Other    Spouse name: Not on file  . Number of children: Not on file  . Years of education: Not on file  . Highest education level: Not on file  Occupational History  . Not on file  Tobacco Use  . Smoking status: Former Research scientist (life sciences)  . Smokeless tobacco: Never Used  Substance and Sexual Activity  . Alcohol use: Yes    Alcohol/week: 0.0 standard drinks    Comment: social   . Drug use: No  . Sexual activity: Yes    Partners: Male  Other Topics Concern  . Not on file  Social History Narrative  . Not on file   Social Determinants of Health   Financial Resource Strain:   . Difficulty of Paying Living Expenses: Not on file  Food Insecurity:   . Worried About Charity fundraiser in the Last Year: Not on file  . Ran Out of Food in the Last Year: Not on file  Transportation Needs:   . Lack of Transportation (Medical): Not on file  . Lack of Transportation (Non-Medical): Not on file  Physical Activity:   . Days of Exercise per Week: Not on file  . Minutes of Exercise per Session: Not on file  Stress:   . Feeling of Stress : Not on file  Social Connections:   .  Frequency of Communication with Friends and Family: Not on file  . Frequency of Social Gatherings with Friends and Family: Not on file  . Attends Religious Services: Not on file  . Active Member of Clubs or Organizations: Not on file  . Attends Archivist Meetings: Not on file  . Marital Status: Not on file   Past Surgical History:  Procedure Laterality Date  . ANTERIOR CRUCIATE LIGAMENT REPAIR Left   . BUNIONECTOMY Right   . LASIK    . LITHOTRIPSY    . MENISCUS REPAIR Left   . NASAL SEPTUM SURGERY    . SHOULDER SURGERY    . VARICOSE VEIN SURGERY Left    Past Medical History:  Diagnosis Date  . Allergy   . Anxiety   . Arthritis   . High triglycerides   . Kidney stones   . Migraines   . Recurrent sinus  infections    BP (!) 144/88   Pulse 99   Temp 97.7 F (36.5 C)   SpO2 98%   Opioid Risk Score:   Fall Risk Score:  `1  Depression screen PHQ 2/9  No flowsheet data found.   Review of Systems     Objective:   Physical Exam Vitals and nursing note reviewed.  Constitutional:      Appearance: Normal appearance.  Eyes:     Extraocular Movements: Extraocular movements intact.     Conjunctiva/sclera: Conjunctivae normal.     Pupils: Pupils are equal, round, and reactive to light.  Musculoskeletal:     Comments: Right shoulder with 1 fingerbreadth subluxation.  She has atrophy of the deltoid as well as periscapular musculature. She has pain with abduction of the arm at around 90 degrees as well as forward flexion.  She has full passive range of motion.   Skin:    General: Skin is warm and dry.  Neurological:     General: No focal deficit present.     Mental Status: She is alert and oriented to person, place, and time.     Gait: Gait abnormal.     Comments: Strength is 0/5 in the right deltoid, bicep, tricep, grip 0/5 in the right hip flexor knee extensor ankle dorsiflexor and plantar flexor Speech comprehension she is unable to follow simple commands and requires gestural cues.  She is unable to close her eyes to command She can make intermittent sounds but no word level communication.  Gait was not tested given the level of assistance that the patient requires  Psychiatric:        Mood and Affect: Mood normal.        Behavior: Behavior normal.           Assessment & Plan:  #1.  Left subcortical hemorrhage with right hemiplegia and global aphasia. Continue outpatient PT OT speech  #2.  Hypertension continue management as per primary care physician Dr. Ernie Hew  #3.  Right shoulder pain she did have a history of shoulder surgery in the past and also received cortisone injections post operatively.  She now has the complicating feature of a severe stroke causing muscle  atrophy and weakness.  I am recommending neuromuscular stimulation and have written prescription for this

## 2019-09-25 NOTE — Patient Instructions (Signed)
Heating pad right shoulder

## 2019-09-28 ENCOUNTER — Ambulatory Visit: Payer: Managed Care, Other (non HMO) | Admitting: Occupational Therapy

## 2019-09-28 ENCOUNTER — Other Ambulatory Visit: Payer: Self-pay

## 2019-09-28 ENCOUNTER — Ambulatory Visit: Payer: Managed Care, Other (non HMO)

## 2019-09-28 ENCOUNTER — Encounter: Payer: Self-pay | Admitting: Physical Therapy

## 2019-09-28 ENCOUNTER — Ambulatory Visit: Payer: Managed Care, Other (non HMO) | Attending: Physical Medicine and Rehabilitation | Admitting: Physical Therapy

## 2019-09-28 DIAGNOSIS — R209 Unspecified disturbances of skin sensation: Secondary | ICD-10-CM | POA: Diagnosis present

## 2019-09-28 DIAGNOSIS — R482 Apraxia: Secondary | ICD-10-CM | POA: Insufficient documentation

## 2019-09-28 DIAGNOSIS — R41841 Cognitive communication deficit: Secondary | ICD-10-CM | POA: Insufficient documentation

## 2019-09-28 DIAGNOSIS — I69219 Unspecified symptoms and signs involving cognitive functions following other nontraumatic intracranial hemorrhage: Secondary | ICD-10-CM | POA: Diagnosis present

## 2019-09-28 DIAGNOSIS — R2689 Other abnormalities of gait and mobility: Secondary | ICD-10-CM | POA: Diagnosis present

## 2019-09-28 DIAGNOSIS — R1310 Dysphagia, unspecified: Secondary | ICD-10-CM

## 2019-09-28 DIAGNOSIS — M6281 Muscle weakness (generalized): Secondary | ICD-10-CM | POA: Diagnosis present

## 2019-09-28 DIAGNOSIS — I69151 Hemiplegia and hemiparesis following nontraumatic intracerebral hemorrhage affecting right dominant side: Secondary | ICD-10-CM | POA: Insufficient documentation

## 2019-09-28 DIAGNOSIS — R4701 Aphasia: Secondary | ICD-10-CM

## 2019-09-28 DIAGNOSIS — I69153 Hemiplegia and hemiparesis following nontraumatic intracerebral hemorrhage affecting right non-dominant side: Secondary | ICD-10-CM | POA: Insufficient documentation

## 2019-09-28 NOTE — Patient Instructions (Addendum)
    hair brush, toothbrush, paint nails, cold, hot, pluck, toilet, spice, lights, drink, eat, wash sleep, and tissue  You do the sign when used in context, to assist Samera in learning and practicing the signs  Your speech will not be like it was before your stroke, but we will work to make it better that it is today.

## 2019-09-28 NOTE — Therapy (Signed)
[facility] [address] [place], [place], [address] Phone: [phone]   Fax:  [phone]  Speech Language Pathology Treatment  Patient Details  Name: Melanie Madden MRN: [id number] Date of Birth: 1966-04-25 Referring Provider (SLP): Alysia Penna, MD   Encounter Date: 09/28/2019  End of Session - 09/28/19 2200    Visit Number  9    Number of Visits  25    Date for SLP Re-Evaluation  12/04/19    Authorization Type  cigna    Authorization Time Period  60 DAYS of therapy regardless of number disciplines per day    SLP Start Time  1105    SLP Stop Time   1145    SLP Time Calculation (min)  40 min    Activity Tolerance  Patient tolerated treatment well       Past Medical History:  Diagnosis Date  . Allergy   . Anxiety   . Arthritis   . High triglycerides   . Kidney stones   . Migraines   . Recurrent sinus infections     Past Surgical History:  Procedure Laterality Date  . ANTERIOR CRUCIATE LIGAMENT REPAIR Left   . BUNIONECTOMY Right   . LASIK    . LITHOTRIPSY    . MENISCUS REPAIR Left   . NASAL SEPTUM SURGERY    . SHOULDER SURGERY    . VARICOSE VEIN SURGERY Left     There were no vitals filed for this visit.  Subjective Assessment - 09/28/19 2154    Subjective  (husband) "I was up three times last night helping (pt) to the bathroom."    Patient is accompained by:  Family member   spouse   Currently in Pain?  No/denies            ADULT SLP TREATMENT - 09/28/19 1136      General Information   Behavior/Cognition  Alert;Cooperative;Distractible;Doesn't follow directions      Treatment Provided   Treatment provided  Cognitive-Linquistic      Cognitive-Linquistic Treatment   Treatment focused on  Aphasia;Apraxia;Patient/family/caregiver education    Skilled Treatment  "We played around a little with Talk Path lst night," husband stated he needs to get a different laptop as his does not  fully support the talk path program. SLP worked with pt with her signs - pt req'd max A/total A 90% of the time first time through the signs. Subsequent times pt req'd max cues 85% of the time. On the way out, pt began "uhhhhh" and with husband quesiton "what to you want?" pt siged "toilet".       Assessment / Recommendations / Plan   Plan  Continue with current plan of care      Progression Toward Goals   Progression toward goals  Progressing toward goals       SLP Education - 09/28/19 1149    Education Details  practice gestures in ADL context with spoken word to habitualize motoric motions    Person(s) Educated  Patient;Spouse    Methods  Explanation;Demonstration;Verbal cues;Tactile cues    Comprehension  Verbalized understanding;Returned demonstration;Tactile cues required;Need further instruction       SLP Short Term Goals - 09/28/19 2201      SLP SHORT TERM GOAL #1   Title  pt will simultaneously with SLP produce 3 different phonemes 50% of the time over three sessions    Time  2    Period  Weeks    Status  On-going  SLP SHORT TERM GOAL #2   Title  pt will ID objects salient to pt in f:4 80% of the time over 3 sessions    Time  2    Period  Weeks    Status  On-going      SLP SHORT TERM GOAL #3   Title  pt will write her full name on first attempt over three sessions    Time  2    Period  Weeks    Status  On-going      SLP SHORT TERM GOAL #4   Title  pt will attempt expressive communication via multimodal means during 5 sessions    Time  2    Period  Weeks    Status  On-going      SLP SHORT TERM GOAL #5   Title  pt will demonstrate undertanding of simple 1-step functional commands with 70% accuracy with multimodal cues over 3 sessions    Time  2    Period  Weeks    Status  On-going      SLP SHORT TERM GOAL #6   Title  pt will look at object or line drawing and match the correct word from f:6 words 90% success over 3 sessions    Time  2    Period  Weeks     Status  On-going       SLP Long Term Goals - 09/28/19 2202      SLP LONG TERM GOAL #1   Title  pt's diet will be appropriately upgraded as needed by SLP    Time  8    Period  Weeks   or 25 total visits, for all LTGs   Status  On-going      SLP LONG TERM GOAL #2   Title  pt will produce 5 simple words/words pertinent to pt (family or pet names, etc) simultaneously with SLP and multimodal cues over three sessions    Time  8    Period  Weeks    Status  On-going      SLP LONG TERM GOAL #3   Title  pt will demo understanding of simple yes/no questions pertinent to desires/wants with multimodal cues 90% of the time over 3 sessions    Time  8    Period  Weeks    Status  On-going      SLP LONG TERM GOAL #4   Title  a device trial with a speech generating device will be initiated within the first 20 ST visits    Time  8    Period  Weeks    Status  On-going      SLP LONG TERM GOAL #5   Title  pt will demo recognition of written word for a common object 100% success over 3 sessions    Time  8    Period  Weeks    Status  On-going       Plan - 09/28/19 2201    Clinical Impression Statement  Cont with no complaints with dys III diet/thin, to SLP knowledge. Pt cont to present with low frustration tolerance, cognitive communication deficits in at least the areas of basic attention and basic awareness, a severe/profound expressive aphasia with verbal apraxia, and severe/profound receptive aphasia ("global aphasia"). See "skilled treatment" for more details of today's session. Pt cont to demonstrate frustration about her inability to perform certain communicative tasks - must have full pt effort for most success possible. Unsure if pt  can complete entire WAB due to frustration and/or ability level. At some point pt may benefit from a speech generating device. She would cont to benefit from skilled ST focusing on incr'ing communicative effectiveness via multimodal means, incr'ing cognition in  functional ways as able given pt's language deficit, and monitoring/upgrading pt's diet as appropriate.    Speech Therapy Frequency  2x / week    Duration  --   8 weeks   Treatment/Interventions  Diet toleration management by SLP;Trials of upgraded texture/liquids;Language facilitation;Internal/external aids;Cognitive reorganization;Cueing hierarchy;SLP instruction and feedback;Patient/family education;Compensatory strategies;Multimodal communcation approach;Functional tasks;Environmental controls    Potential to Achieve Goals  Fair    Potential Considerations  Severity of impairments;Cooperation/participation level       Patient will benefit from skilled therapeutic intervention in order to improve the following deficits and impairments:   Aphasia  Verbal apraxia  Cognitive communication deficit  Dysphagia, unspecified type    Problem List Patient Active Problem List   Diagnosis Date Noted  . Nontraumatic subcortical hemorrhage of left cerebral hemisphere ([place]) 08/27/2019  . Acute blood loss anemia   . Hypoalbuminemia due to protein-calorie malnutrition ([place])   . Thrombocytopenia ([place])   . Dysphagia 07/29/2019  . Infarction of left basal ganglia ([place]) 07/18/2019  . Hypernatremia   . Leukocytosis   . Essential hypertension   . Global aphasia   . Cytotoxic brain edema ([place]) 07/10/2019  . ICH (intracerebral hemorrhage) ([place]) 07/09/2019  . Anxiety state 02/21/2015  . Cephalalgia 12/21/2014    [place] ,[place], [place]  09/28/2019, 10:02 PM  [facility] [address] [place], [place], [address] Phone: [phone]   Fax:  [phone]   Name: Melanie Madden MRN: [id number] Date of Birth: [birth year]

## 2019-09-28 NOTE — Patient Instructions (Signed)
Your Splint This splint should initially be fitted by a healthcare practitioner.  The healthcare practitioner is responsible for providing wearing instructions and precautions to the patient, other healthcare practitioners and care provider involved in the patient's care.  This splint was custom made for you. Please read the following instructions to learn about wearing and caring for your splint.  Precautions Should your splint cause any of the following problems, remove the splint immediately and contact your therapist/physician.  Swelling  Severe Pain  Pressure Areas  Stiffness  Numbness  Do not wear your splint while operating machinery unless it has been fabricated for that purpose.  When To Wear Your Splint Where your splint according to your therapist/physician instructions. Nighttime (Begin by building up tolerance over next 2-3 days before wearing all night)   Care and Cleaning of Your Splint 1. Keep your splint away from open flames. 2. Your splint will lose its shape in temperatures over 135 degrees Farenheit, ( in car windows, near radiators, ovens or in hot water).  Never make any adjustments to your splint, if the splint needs adjusting remove it and make an appointment to see your therapist. 3. Your splint may be cleaned with rubbing alcohol.  Do not immerse in hot water over 135 degrees Farenheit.

## 2019-09-28 NOTE — Therapy (Signed)
[facility] [address] [place], [place], [address] Phone: [phone]   Fax:  [phone]  Physical Therapy Treatment  Patient Details  Name: Melanie Madden MRN: [id number] Date of Birth: [birth year] Referring Provider (PT): Lauraine Rinne, [place]   Encounter Date: 09/28/2019  PT End of Session - 09/28/19 1414    Visit Number  8    Number of Visits  20    Date for PT Re-Evaluation  11/24/19    Authorization Type  [employer]- 60 VL combined PT/OT/ST - counts as 1 visit if seen on same day.  Not sure if 60 is hard max.  $50 copay each day - make sure PT/OT/ST on same day if possible    Authorization - Visit Number  10   PT/OT/ST days combined   Authorization - Number of Visits  20    PT Start Time  1015    PT Stop Time  1100    PT Time Calculation (min)  45 min    Equipment Utilized During Treatment  --   pt's R AFO; added forefront wedge   Activity Tolerance  Patient tolerated treatment well    Behavior During Therapy  Impulsive       Past Medical History:  Diagnosis Date  . Allergy   . Anxiety   . Arthritis   . High triglycerides   . Kidney stones   . Migraines   . Recurrent sinus infections     Past Surgical History:  Procedure Laterality Date  . ANTERIOR CRUCIATE LIGAMENT REPAIR Left   . BUNIONECTOMY Right   . LASIK    . LITHOTRIPSY    . MENISCUS REPAIR Left   . NASAL SEPTUM SURGERY    . SHOULDER SURGERY    . VARICOSE VEIN SURGERY Left     There were no vitals filed for this visit.  Subjective Assessment - 09/28/19 1352    Subjective  Pt just finished with OT, now has resting hand splint for RUE.  Husband states that pt is made because she had to wear two shirts to practice taking shirt off with OT.    Patient is accompained by:  Family member    Pertinent History  anxiety, migraines, HTN    Patient Stated Goals  Per husband report they would like her to get stronger on right, improve mobility, and work  on speech.    Currently in Pain?  No/denies                       [facility] Adult PT Treatment/Exercise - 09/28/19 1026      Transfers   Transfers  Sit to Stand;Stand to [employer];Supine to Sit;Sit to Supine    Sit to Stand  4: Min assist    Stand to Sit  4: Min assist    Stand to Sit Details  assist to remove RUE from hand orthosis    Stand Pivot Transfers  3: Mod assist    Stand Pivot Transfer Details (indicate cue type and reason)  without and then with use of RW.  Without use of RW therapist provided support on R side to stabilize RLE in stance while pivoting with LLE and to support RUE.  With RW pt continued to require assistance to stabilize RLE in stance phase and verbal cues for lateral stepping and retro stepping with RLE    Supine to Sit  4: Min assist    Supine to Sit Details (indicate  cue type and reason)  assistance to bring RLE off of mat    Sit to Supine  3: Mod assist    Sit to Supine Details   assistance to bring bilat LE fully onto mat      Ambulation/Gait   Ambulation/Gait  Yes    Ambulation/Gait Assistance  3: Mod assist    Ambulation/Gait Assistance Details  No visual target for R step length utilized today.  Therapist continued to provide significant tactile and verbal cues for L lateral weight shift during L stance phase to allow pt to initiate R swing with hip and knee flexion instead of trunk flexion >extension.  Pt required intermittent assistance to keep RW close to body when pt overutilizes trunk flexion to advance RLE.  Continues to demonstrate decreased stance on R and decreased step length on L.    Ambulation Distance (Feet)  115 Feet    Assistive device  Rolling walker    Gait Pattern  Step-to pattern;Step-through pattern;Decreased step length - left;Decreased stance time - right;Decreased hip/knee flexion - right;Decreased dorsiflexion - right;Right flexed knee in stance    Ambulation Surface  Level;Indoor      Clinical biochemist  Yes    Wheelchair Assistance  5: Supervision    Wheelchair Propulsion  Left upper extremity;Left lower extremity    Distance  100    Comments  verbal cues to initiate but pt able to propel forwards and make 180 deg turn with supervision      Neuro Re-ed    Neuro Re-ed Details   Performed multiple sit <> stands from elevated mat with L foot wedged on 2" block to facilitate increased weight shift, weight acceptance on RLE and increased activation of RLE extensors - performed x 10.  Transitioned to standing and coming into sustained squat with therapist supporting RUE and maintaining RLE placement while performing lateral weight shifting in squat position x 5 reps.  After gait training - returned to mat and continued NMR with use of mirror as visual feedback to perform L lateral weight shifting in standing initiating weight shift at pelvis x 8 reps.  Add to weight shift RLE advancement forwards and then back x 8 reps with mod A               PT Short Term Goals - 09/28/19 1014      PT SHORT TERM GOAL #1   Title  Pt will be able to perform initial HEP with assist of husband for improved function.    Time  4    Period  Weeks    Status  On-going    Target Date  09/25/19      PT SHORT TERM GOAL #2   Title  Pt will transfer w/c to/from mat with slideboard CGA with assist to set up slideboard for improved mobility.    Baseline  performing squat pivots and stand pivots    Time  4    Period  Weeks    Status  Achieved    Target Date  09/25/19      PT SHORT TERM GOAL #3   Title  Pt will be able to stand x 2 min mod assist with PT blocking right knee for improved standing balance.    Time  4    Period  Weeks    Status  Achieved    Target Date  09/25/19      PT SHORT TERM GOAL #4   Title  Pt will perform stand/pivot transfer mod assist of husband to allow improved safety with transfers at home.    Time  4    Period  Weeks    Status  Achieved    Target  Date  09/25/19      PT SHORT TERM GOAL #5   Title  Pt will perform all bed mobility min assist or less for improved mobility.    Time  4    Period  Weeks    Status  Partially Met    Target Date  09/25/19      PT SHORT TERM GOAL #6   Title  Pt will propel w/c  100' supervision for improved mobility in home.    Time  4    Period  Weeks    Status  Achieved    Target Date  09/25/19        PT Long Term Goals - 08/26/19 0841      PT LONG TERM GOAL #1   Title  Pt will be able to perform progressive HEP for strengthening, balance and mobility with assist of husband to continued gains at home.    Time  10    Period  Weeks    Status  New    Target Date  11/04/19      PT LONG TERM GOAL #2   Title  Pt will be able to perform all transfers min assist for improved safety with mobility.    Time  10    Period  Weeks    Status  New    Target Date  11/04/19      PT LONG TERM GOAL #3   Title  Pt will be able to ambulate 10' with max assist for improved access to bathroom at home with husband with appropriate AD.    Time  10    Period  Weeks    Status  New    Target Date  11/04/19            Plan - 09/28/19 1415    Clinical Impression Statement  Pt is making good progress and has met 4/6 STG - she requires min-mod A for bed mobility, is now performing transfers with squat or stand pivot with min-mod A and has initiated gait training with RW with hand orthosis.  She is able to propel w/c safely with hemi technique with supervision.  Final HEP STG not assessed today.  Continued to focus on NMR addressing active weight shifting to R for increased stance phase activation and weight shift to L during gait to allow for active hip and knee flexion in R swing phase.  Pt continues to require mod A overall for these activities and continues to not be safe to perform at home with husband yet.  Will continue to progress towards LTG.    Personal Factors and Comorbidities  Comorbidity 3+     Comorbidities  anxiety, migraines, HTN    Examination-Activity Limitations  Bathing;Bed Mobility;Stand;Locomotion Level;Squat;Stairs;Dressing;Transfers    Examination-Participation Restrictions  Community Activity;Driving;Shop;Laundry;Meal Prep    Stability/Clinical Decision Making  Evolving/Moderate complexity    Rehab Potential  Fair    PT Frequency  2x / week    PT Duration  Other (comment)   10 weeks   PT Treatment/Interventions  ADLs/Self Care Home Management;Electrical Stimulation;DME Instruction;Gait training;Stair training;Functional mobility training;Therapeutic activities;Therapeutic exercise;Orthotic Fit/Training;Patient/family education;Neuromuscular re-education;Balance training;Manual techniques;Passive range of motion;Vestibular;Wheelchair mobility training    PT Next Visit Plan  Assess vitals prior to therapy.  CHECK final STG - review and revise HEP if needed.  Tape wedge to front of shoe to promote knee extension; Seated weight shifting to L, sit > stand on Steady reaching up and to the L.  Mirror therapy - although may be confusing or upsetting due to visual impairment in R lower quadrant?  Use of powderboard, supine exercises (possibly add these to HEP); sit<>stand with block under L foot to shift weight to R, Increasing R stance time for increased swing with LLE    Consulted and Agree with Plan of Care  Patient;Family member/caregiver    Family Member Consulted  husband       Patient will benefit from skilled therapeutic intervention in order to improve the following deficits and impairments:  Abnormal gait, Decreased activity tolerance, Decreased balance, Decreased cognition, Decreased knowledge of use of DME, Decreased mobility, Decreased range of motion, Decreased safety awareness, Decreased strength, Impaired sensation  Visit Diagnosis: Muscle weakness (generalized)  Other abnormalities of gait and mobility  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 Nontraumatic subcortical hemorrhage of left cerebral hemisphere ([place]) 08/27/2019  . Acute blood loss anemia   . Hypoalbuminemia due to protein-calorie malnutrition ([place])   . Thrombocytopenia ([place])   . Dysphagia 07/29/2019  . Infarction of left basal ganglia ([place]) 07/18/2019  . Hypernatremia   . Leukocytosis   . Essential hypertension   . Global aphasia   . Cytotoxic brain edema ([place]) 07/10/2019  . ICH (intracerebral hemorrhage) ([place]) 07/09/2019  . Anxiety state 02/21/2015  . Cephalalgia 12/21/2014   Rico Junker, PT, DPT 09/28/19    2:35 PM    [facility] [address] [place], [place], [address] Phone: [phone]   Fax:  [phone]  Name: Jacelyn Cuen MRN: [id number] Date of Birth: 1966-07-25

## 2019-09-28 NOTE — Therapy (Signed)
[facility] [address] [place], [place], [address] Phone: [phone]   Fax:  [phone]  Occupational Therapy Treatment  Patient Details  Name: Melanie Madden MRN: [id number] Date of Birth: [birth year] Referring Provider (OT): Dr. Alysia Penna   Encounter Date: 09/28/2019  OT End of Session - 09/28/19 1013    Visit Number  7    Number of Visits  25    Date for OT Re-Evaluation  12/01/19    Authorization Type  [employer] 2021:  No prior auth OT, 60 DAY visit limit (regardless of how many therapies each day).    Authorization - Visit Number  10    Authorization - Number of Visits  48    OT Start Time  0932    OT Stop Time  [id number]    OT Time Calculation (min)  43 min    Activity Tolerance  Patient tolerated treatment well    Behavior During Therapy  Impulsive       Past Medical History:  Diagnosis Date  . Allergy   . Anxiety   . Arthritis   . High triglycerides   . Kidney stones   . Migraines   . Recurrent sinus infections     Past Surgical History:  Procedure Laterality Date  . ANTERIOR CRUCIATE LIGAMENT REPAIR Left   . BUNIONECTOMY Right   . LASIK    . LITHOTRIPSY    . MENISCUS REPAIR Left   . NASAL SEPTUM SURGERY    . SHOULDER SURGERY    . VARICOSE VEIN SURGERY Left     There were no vitals filed for this visit.  Subjective Assessment - 09/28/19 0937    Subjective   inconsistent responses due to aphasia    Patient is accompanied by:  Family member   HUSBAND   Pertinent History  Subcortical hemorrhage of R cerbral hemisphere.  PMH:  HTN, anxiety, migraines, hx of shoulder injection approx 2 weeks ago for R shoulder pain, hx of R shoulder surgery (arthroscopic)    Limitations  aphasia, fall risk, impulsive    Patient Stated Goals  husband reports: to be able to care for herself more (pt indicates that she onces to return to previous activities per questioning/gestures)    Currently in Pain?  No/denies        Fabricated and fitted resting hand splint to patient and issued. Reviewed wear and care w/ pt/husband                    OT Education - 09/28/19 1012    Education Details  splint wear and care    Person(s) Educated  Patient;Spouse    Methods  Explanation;Demonstration;Handout    Comprehension  Verbalized understanding;Returned demonstration       OT Short Term Goals - 09/23/19 1025      OT SHORT TERM GOAL #1   Title  Pt/husband will be independent with initial HEP for RUE ROM--check STGs 10/14/19    Time  6    Period  Weeks    Status  On-going      OT SHORT TERM GOAL #2   Title  Pt will perform UB dressing with min A.    Time  6    Period  Weeks    Status  New      OT SHORT TERM GOAL #3   Title  Pt will perform UB bathing with mod A.    Time  6    Period  Weeks    Status  New      OT SHORT TERM GOAL #4   Title  Pt will be able to brush hair with set-up.    Time  6    Period  Weeks    Status  New      OT SHORT TERM GOAL #5   Title  Pt will demo at least 110* shoulder flex PROM in supine without pain for incr ease for ADLs.    Time  6    Period  Weeks    Status  New        OT Long Term Goals - 09/02/19 1229      OT LONG TERM GOAL #1   Title  Pt/husband will be independent with HEP for functional activities for incr participation in IADLs/leisure tasks and for cognition (and incorporating RUE if able).--check LTGs 12/01/19    Time  12    Period  Weeks    Status  New      OT LONG TERM GOAL #2   Title  Pt will perform UB dressing with supervision.    Time  12    Period  Weeks    Status  New      OT LONG TERM GOAL #3   Title  Pt will perform LB dressing with mod A.    Time  12    Period  Weeks    Status  New      OT LONG TERM GOAL #4   Title  Pt will perform UB bathing with min A.    Time  12    Period  Weeks    Status  New      OT LONG TERM GOAL #5   Title  Pt will perform simple cold snack prep from w/c level with min A/set  up.    Time  12    Period  Weeks    Status  New      OT LONG TERM GOAL #6   Title  Pt will perform toileting with mod A.    Time  12    Period  Weeks    Status  New            Plan - 09/28/19 1014    Clinical Impression Statement  Pt gradually progressing w/ ADL status but continues to demo impulsivity and behavioral changes from stroke    Occupational performance deficits (Please refer to evaluation for details):  ADL's;IADL's;Leisure;Work;Social Participation    Body Structure / Function / Physical Skills  ADL;ROM;IADL;Sensation;Mobility;Strength;Tone;UE functional use;Decreased knowledge of precautions;Decreased knowledge of use of DME;Coordination;Balance;FMC;GMC    Cognitive Skills  Attention;Temperament/Personality;Understand;Thought;Safety Awareness    Rehab Potential  Good    OT Frequency  2x / week    OT Duration  12 weeks    OT Treatment/Interventions  Self-care/ADL training;Moist Heat;DME and/or AE instruction;Splinting;Therapeutic activities;Aquatic Therapy;Cognitive remediation/compensation;Therapeutic exercise;Cryotherapy;Neuromuscular education;Functional Mobility Training;Passive range of motion;Visual/perceptual remediation/compensation;Patient/family education;Manual Therapy;Electrical Stimulation;Fluidtherapy    Plan  continue to address ADLs, assess resting hand splint, estim to triceps with neuro re-ed if time (no contraindications)    Consulted and Agree with Plan of Care  Patient;Family member/caregiver    Family Member Consulted  husband       Patient will benefit from skilled therapeutic intervention in order to improve the following deficits and impairments:   Body Structure / Function / Physical Skills: ADL, ROM, IADL, Sensation, Mobility, Strength, Tone, UE functional use, Decreased knowledge of precautions, Decreased knowledge of use  of DME, Coordination, Balance, [place], [place] Cognitive Skills: Attention, Temperament/Personality, Understand, Thought, Safety  Awareness     Visit Diagnosis: Hemiplegia and hemiparesis following nontraumatic intracerebral hemorrhage affecting right dominant side [facility])    Problem List Patient Active Problem List   Diagnosis Date Noted  . Nontraumatic subcortical hemorrhage of left cerebral hemisphere ([facility]) 08/27/2019  . Acute blood loss anemia   . Hypoalbuminemia due to protein-calorie malnutrition ([facility])   . Thrombocytopenia ([place])   . Dysphagia 07/29/2019  . Infarction of left basal ganglia ([facility]) 07/18/2019  . Hypernatremia   . Leukocytosis   . Essential hypertension   . Global aphasia   . Cytotoxic brain edema ([facility]) 07/10/2019  . ICH (intracerebral hemorrhage) ([facility]) 07/09/2019  . Anxiety state 02/21/2015  . Cephalalgia 12/21/2014    Carey Bullocks, OTR/L 09/28/2019, 12:07 PM  [facility] [address] [place], [place], [address] Phone: [phone]   Fax:  [phone]  Name: Melanie Madden MRN: [id number] Date of Birth: 12-Aug-1965

## 2019-09-30 ENCOUNTER — Ambulatory Visit: Payer: Managed Care, Other (non HMO) | Admitting: Occupational Therapy

## 2019-09-30 ENCOUNTER — Ambulatory Visit: Payer: Managed Care, Other (non HMO)

## 2019-09-30 ENCOUNTER — Encounter: Payer: Self-pay | Admitting: Occupational Therapy

## 2019-09-30 ENCOUNTER — Other Ambulatory Visit: Payer: Self-pay

## 2019-09-30 DIAGNOSIS — R2689 Other abnormalities of gait and mobility: Secondary | ICD-10-CM

## 2019-09-30 DIAGNOSIS — M6281 Muscle weakness (generalized): Secondary | ICD-10-CM

## 2019-09-30 DIAGNOSIS — R1310 Dysphagia, unspecified: Secondary | ICD-10-CM

## 2019-09-30 DIAGNOSIS — R4701 Aphasia: Secondary | ICD-10-CM

## 2019-09-30 DIAGNOSIS — I69219 Unspecified symptoms and signs involving cognitive functions following other nontraumatic intracranial hemorrhage: Secondary | ICD-10-CM

## 2019-09-30 DIAGNOSIS — R208 Other disturbances of skin sensation: Secondary | ICD-10-CM

## 2019-09-30 DIAGNOSIS — R41841 Cognitive communication deficit: Secondary | ICD-10-CM

## 2019-09-30 DIAGNOSIS — R482 Apraxia: Secondary | ICD-10-CM

## 2019-09-30 DIAGNOSIS — I69151 Hemiplegia and hemiparesis following nontraumatic intracerebral hemorrhage affecting right dominant side: Secondary | ICD-10-CM

## 2019-09-30 NOTE — Therapy (Signed)
[facility] [address] [place], [place], [address] Phone: [phone]   Fax:  [phone]  Physical Therapy Treatment  Patient Details  Name: Melanie Madden MRN: [id number] Date of Birth: Nov 05, 1965 Referring Provider (PT): Lauraine Rinne, [place]   Encounter Date: 09/30/2019  PT End of Session - 09/30/19 1111    Visit Number  9    Number of Visits  20    Date for PT Re-Evaluation  11/24/19    Authorization Type  [employer]- 60 VL combined PT/OT/ST - counts as 1 visit if seen on same day.  Not sure if 60 is hard max.  $50 copay each day - make sure PT/OT/ST on same day if possible    Authorization - Visit Number  11   PT/OT/ST days combined   Authorization - Number of Visits  20    PT Start Time  1110   OT ran over   PT Stop Time  1145    PT Time Calculation (min)  35 min    Equipment Utilized During Treatment  --   pt's R AFO; added forefront wedge   Activity Tolerance  Patient tolerated treatment well    Behavior During Therapy  Impulsive       Past Medical History:  Diagnosis Date  . Allergy   . Anxiety   . Arthritis   . High triglycerides   . Kidney stones   . Migraines   . Recurrent sinus infections     Past Surgical History:  Procedure Laterality Date  . ANTERIOR CRUCIATE LIGAMENT REPAIR Left   . BUNIONECTOMY Right   . LASIK    . LITHOTRIPSY    . MENISCUS REPAIR Left   . NASAL SEPTUM SURGERY    . SHOULDER SURGERY    . VARICOSE VEIN SURGERY Left     There were no vitals filed for this visit.  Subjective Assessment - 09/30/19 1106    Subjective  Pt just finished with OT. Now has wrist splint for day time.    Patient is accompained by:  Family member    Pertinent History  anxiety, migraines, HTN    Patient Stated Goals  Per husband report they would like her to get stronger on right, improve mobility, and work on speech.    Currently in Pain?  No/denies                       [facility]  Adult PT Treatment/Exercise - 09/30/19 1112      Transfers   Transfers  Sit to Stand;Stand to [employer] Transfers    Sit to Stand  4: Min assist    Stand to Sit  4: Min assist    Stand Pivot Transfers  3: Mod assist    Stand Pivot Transfer Details (indicate cue type and reason)  w/c to/from mat with PT blocking right knee      Ambulation/Gait   Ambulation/Gait  Yes    Ambulation/Gait Assistance  3: Mod assist;2: Max assist    Ambulation/Gait Assistance Details  Rehab tech at trunk to stabilize and PT assisting to advance right leg. Pt was given verbal cues to slow down and be sure right leg had been placed prior to stepping with left. Also cued to take larger left step. Able to improve slightly with this towards end when she started to slow down some but still impulsive. Pt needed mod/max assist to advance right leg. Lift taped under forefoot or right  shoe to try to facilitate more knee extension at stance. Did seem to improve some when pt allowed right leg to advance far enough prior to stepping with left with less buckling.     Ambulation Distance (Feet)  115 Feet    Assistive device  Rolling walker   right hand grip attachment, right AFO   Gait Pattern  Step-to pattern;Decreased step length - right;Decreased step length - left;Decreased hip/knee flexion - right;Decreased stance time - right    Ambulation Surface  Level;Indoor      Neuro Re-ed    Neuro Re-ed Details   Sitting edge of mat: reaching to left to try to facilitate pelvic shift left x 1 min with patient having difficulty following commands/finding targets with just moving her arm to more mimic therapist, repeated in standing with PT stabilizing at right knee and CGA/min assist for stability. Sit to stand from mat with verbal cues to shift weight over right leg and PT facilitating right weight shift x 5.  Standing weight shifting side to side with pt holding therapists shoulder and helping to facilitate shift as well as  providing verbal cues. After gait stepping forward and back with left LE past right foot with therapist stabilizing right knee x 10 with patient standing at walker. Pt was able to increase left step length.             PT Education - 09/30/19 1440    Education Details  Pt to continue with current HEP    Person(s) Educated  Patient;Spouse    Methods  Explanation    Comprehension  Verbalized understanding       PT Short Term Goals - 09/28/19 1014      PT SHORT TERM GOAL #1   Title  Pt will be able to perform initial HEP with assist of husband for improved function.    Time  4    Period  Weeks    Status  On-going    Target Date  09/25/19      PT SHORT TERM GOAL #2   Title  Pt will transfer w/c to/from mat with slideboard CGA with assist to set up slideboard for improved mobility.    Baseline  performing squat pivots and stand pivots    Time  4    Period  Weeks    Status  Achieved    Target Date  09/25/19      PT SHORT TERM GOAL #3   Title  Pt will be able to stand x 2 min mod assist with PT blocking right knee for improved standing balance.    Time  4    Period  Weeks    Status  Achieved    Target Date  09/25/19      PT SHORT TERM GOAL #4   Title  Pt will perform stand/pivot transfer mod assist of husband to allow improved safety with transfers at home.    Time  4    Period  Weeks    Status  Achieved    Target Date  09/25/19      PT SHORT TERM GOAL #5   Title  Pt will perform all bed mobility min assist or less for improved mobility.    Time  4    Period  Weeks    Status  Partially Met    Target Date  09/25/19      PT SHORT TERM GOAL #6   Title  Pt will propel w/c  100'  supervision for improved mobility in home.    Time  4    Period  Weeks    Status  Achieved    Target Date  09/25/19        PT Long Term Goals - 08/26/19 0841      PT LONG TERM GOAL #1   Title  Pt will be able to perform progressive HEP for strengthening, balance and mobility with  assist of husband to continued gains at home.    Time  10    Period  Weeks    Status  New    Target Date  11/04/19      PT LONG TERM GOAL #2   Title  Pt will be able to perform all transfers min assist for improved safety with mobility.    Time  10    Period  Weeks    Status  New    Target Date  11/04/19      PT LONG TERM GOAL #3   Title  Pt will be able to ambulate 10' with max assist for improved access to bathroom at home with husband with appropriate AD.    Time  10    Period  Weeks    Status  New    Target Date  11/04/19            Plan - 09/30/19 1440    Clinical Impression Statement  PT tried shoe lift taped to forefoot of right shoe to facilitate knee extension in stance. Seemed to help some with knee stability but will need to continue to trial to further assess. Pt is impulsive when trying to step and still needs mod/max assist to advance right LE. Was able to get a couple larger steps with left foot after practice.    Personal Factors and Comorbidities  Comorbidity 3+    Comorbidities  anxiety, migraines, HTN    Examination-Activity Limitations  Bathing;Bed Mobility;Stand;Locomotion Level;Squat;Stairs;Dressing;Transfers    Examination-Participation Restrictions  Community Activity;Driving;Shop;Laundry;Meal Prep    Stability/Clinical Decision Making  Evolving/Moderate complexity    Rehab Potential  Fair    PT Frequency  2x / week    PT Duration  Other (comment)   10 weeks   PT Treatment/Interventions  ADLs/Self Care Home Management;Electrical Stimulation;DME Instruction;Gait training;Stair training;Functional mobility training;Therapeutic activities;Therapeutic exercise;Orthotic Fit/Training;Patient/family education;Neuromuscular re-education;Balance training;Manual techniques;Passive range of motion;Vestibular;Wheelchair mobility training    PT Next Visit Plan  Assess vitals prior to therapy.  CHECK final STG - review and revise HEP if needed.  Tape wedge to front of  shoe to promote knee extension; Seated weight shifting to L, sit > stand on Steady reaching up and to the L.  Mirror therapy - although may be confusing or upsetting due to visual impairment in R lower quadrant?  Use of powderboard, supine exercises (possibly add these to HEP); sit<>stand with block under L foot to shift weight to R, Increasing R stance time for increased swing with LLE    Consulted and Agree with Plan of Care  Patient;Family member/caregiver    Family Member Consulted  husband       Patient will benefit from skilled therapeutic intervention in order to improve the following deficits and impairments:  Abnormal gait, Decreased activity tolerance, Decreased balance, Decreased cognition, Decreased knowledge of use of DME, Decreased mobility, Decreased range of motion, Decreased safety awareness, Decreased strength, Impaired sensation  Visit Diagnosis: Muscle weakness (generalized)  Other abnormalities of gait and mobility     Problem List Patient Active  Problem List   Diagnosis Date Noted  . Nontraumatic subcortical hemorrhage of left cerebral hemisphere ([facility]) 08/27/2019  . Acute blood loss anemia   . Hypoalbuminemia due to protein-calorie malnutrition ([facility])   . Thrombocytopenia ([facility] [facility])   . Dysphagia 07/29/2019  . Infarction of left basal ganglia ([place]) 07/18/2019  . Hypernatremia   . Leukocytosis   . Essential hypertension   . Global aphasia   . Cytotoxic brain edema ([place]) 07/10/2019  . ICH (intracerebral hemorrhage) ([facility]) 07/09/2019  . Anxiety state 02/21/2015  . Cephalalgia 12/21/2014    Electa Sniff, PT, DPT, NCS 09/30/2019, 2:43 PM  [facility] [address] [place], [place], [address] Phone: [phone]   Fax:  [phone]  Name: Melanie Madden MRN: [id number] Date of Birth: 1965/08/23

## 2019-09-30 NOTE — Therapy (Signed)
[facility] [address] [place], [place], [address] Phone: [phone]   Fax:  [phone]  Occupational Therapy Treatment  Patient Details  Name: Melanie Madden MRN: [id number] Date of Birth: Mar 15, 1966 Referring Provider (OT): Dr. Alysia Penna   Encounter Date: 09/30/2019  OT End of Session - 09/30/19 1001    Visit Number  8    Number of Visits  25    Date for OT Re-Evaluation  12/01/19    Authorization Type  [employer] 2021:  No prior auth OT, 60 DAY visit limit (regardless of how many therapies each day).    Authorization - Visit Number  11    Authorization - Number of Visits  24    OT Start Time  1024    OT Stop Time  1102    OT Time Calculation (min)  38 min    Activity Tolerance  Patient tolerated treatment well    Behavior During Therapy  Impulsive       Past Medical History:  Diagnosis Date  . Allergy   . Anxiety   . Arthritis   . High triglycerides   . Kidney stones   . Migraines   . Recurrent sinus infections     Past Surgical History:  Procedure Laterality Date  . ANTERIOR CRUCIATE LIGAMENT REPAIR Left   . BUNIONECTOMY Right   . LASIK    . LITHOTRIPSY    . MENISCUS REPAIR Left   . NASAL SEPTUM SURGERY    . SHOULDER SURGERY    . VARICOSE VEIN SURGERY Left     There were no vitals filed for this visit.  Subjective Assessment - 09/30/19 1001    Subjective   inconsistent responses due to aphasia (gestures only, but inconsistent)    Patient is accompanied by:  Family member   HUSBAND   Pertinent History  Subcortical hemorrhage of R cerbral hemisphere.  PMH:  HTN, anxiety, migraines, hx of shoulder injection approx 2 weeks ago for R shoulder pain, hx of R shoulder surgery (arthroscopic)    Limitations  aphasia, fall risk, impulsive (husband reports hx of OCD)    Special Tests  PER HUSBAND for sit>stand:  pt responds to "big push" with knee blocked on both side    Patient Stated Goals  husband  reports: to be able to care for herself more (pt indicates that she onces to return to previous activities per questioning/gestures)    Currently in Pain?  No/denies        Transfer w/c>mat  with mod A/cueing with impulsivity   Supine, attempting to stabilize RUE in various locations, but pt unable.  Then, body on arm movements/wt. Bearing through R side for partial roll with LUE reaching with min cueing/facilitation.  Progressed to roll to R for Supine>sitting with min-mod A/cues for normal movement patterns.  Husband reports that pt attempted brushing hair, but would not attempt to perform on R side.   Sitting, light wt bearing with body on arm movements to incr activation RUE with min-mod assist/facilitation (visual, verbal, tactile cues given for normal movement patterns), improved with repetition and progressed to performing without physical assist inconsistently.   NMES x28min to R wrist/finger extensors, 50pps, 250 pulse width, 10sec cycle, 2sec ramp, intensity=21 with cueing given for attempts for incr muscle activation while attempting to  "grasp (off cycle) /release (on cycle)" cylinder object with facilitation for grasp.   Then attempted after NMES removed with max facilitation (controlled with wrist movement by therapist)  Noted incr muscle tone in R hand and shoulder after wt. Bearing and NMES (not as flacid).  Attempted light wt. Bearing/AAROM shoulder flex/elbow ext with UE ranger, pt utilized trunk movements and was unable to isolate, max facilitation.  Recommended trial of NMES to triceps but pt expressed that she did not want to remove shirt or change into gown to be able to place electrodes (was wearing long-sleeve shirt with tight sleeves).  Pt with times of frustration throughout session, but expressed appreciation and was calm by end of session.    OT Education - 09/30/19 1134    Education Details  Encouraged pt to brush hair (look in mirror), wash face, put lotion on  RUE, recommended putting RUE on table when eating if able to incr to incr awareness to R side (with spouse cueing)    Person(s) Educated  Patient;Spouse    Methods  Explanation    Comprehension  Verbalized understanding       OT Short Term Goals - 09/23/19 1025      OT SHORT TERM GOAL #1   Title  Pt/husband will be independent with initial HEP for RUE ROM--check STGs 10/14/19    Time  6    Period  Weeks    Status  On-going      OT SHORT TERM GOAL #2   Title  Pt will perform UB dressing with min A.    Time  6    Period  Weeks    Status  New      OT SHORT TERM GOAL #3   Title  Pt will perform UB bathing with mod A.    Time  6    Period  Weeks    Status  New      OT SHORT TERM GOAL #4   Title  Pt will be able to brush hair with set-up.    Time  6    Period  Weeks    Status  New      OT SHORT TERM GOAL #5   Title  Pt will demo at least 110* shoulder flex PROM in supine without pain for incr ease for ADLs.    Time  6    Period  Weeks    Status  New        OT Long Term Goals - 09/02/19 1229      OT LONG TERM GOAL #1   Title  Pt/husband will be independent with HEP for functional activities for incr participation in IADLs/leisure tasks and for cognition (and incorporating RUE if able).--check LTGs 12/01/19    Time  12    Period  Weeks    Status  New      OT LONG TERM GOAL #2   Title  Pt will perform UB dressing with supervision.    Time  12    Period  Weeks    Status  New      OT LONG TERM GOAL #3   Title  Pt will perform LB dressing with mod A.    Time  12    Period  Weeks    Status  New      OT LONG TERM GOAL #4   Title  Pt will perform UB bathing with min A.    Time  12    Period  Weeks    Status  New      OT LONG TERM GOAL #5   Title  Pt will perform simple cold snack prep  from w/c level with min A/set up.    Time  12    Period  Weeks    Status  New      OT LONG TERM GOAL #6   Title  Pt will perform toileting with mod A.    Time  12    Period   Weeks    Status  New            Plan - 09/30/19 1002    Clinical Impression Statement  Pt gradually progressing but continues to demo impulsivity and behavioral changes from stroke affecting participation at times due to frustration.  Pt did demo incr tricep activation with wt. bearing today with repetition, but not consistent.    Occupational performance deficits (Please refer to evaluation for details):  ADL's;IADL's;Leisure;Work;Social Participation    Body Structure / Function / Physical Skills  ADL;ROM;IADL;Sensation;Mobility;Strength;Tone;UE functional use;Decreased knowledge of precautions;Decreased knowledge of use of DME;Coordination;Balance;FMC;GMC    Cognitive Skills  Attention;Temperament/Personality;Understand;Thought;Safety Awareness    Rehab Potential  Good    OT Frequency  2x / week    OT Duration  12 weeks    OT Treatment/Interventions  Self-care/ADL training;Moist Heat;DME and/or AE instruction;Splinting;Therapeutic activities;Aquatic Therapy;Cognitive remediation/compensation;Therapeutic exercise;Cryotherapy;Neuromuscular education;Functional Mobility Training;Passive range of motion;Visual/perceptual remediation/compensation;Patient/family education;Manual Therapy;Electrical Stimulation;Fluidtherapy    Plan  continue to address ADLs/begin checking STGs, estim to triceps with neuro re-ed, light wt. bearing    Consulted and Agree with Plan of Care  Patient;Family member/caregiver    Family Member Consulted  husband       Patient will benefit from skilled therapeutic intervention in order to improve the following deficits and impairments:   Body Structure / Function / Physical Skills: ADL, ROM, IADL, Sensation, Mobility, Strength, Tone, UE functional use, Decreased knowledge of precautions, Decreased knowledge of use of DME, Coordination, Balance, FMC, GMC Cognitive Skills: Attention, Temperament/Personality, Understand, Thought, Safety Awareness     Visit  Diagnosis: Hemiplegia and hemiparesis following nontraumatic intracerebral hemorrhage affecting right dominant side (HCC)  Muscle weakness (generalized)  Other abnormalities of gait and mobility  Other disturbances of skin sensation  Unspecified symptoms and signs involving cognitive functions following other nontraumatic intracranial hemorrhage    Problem List Patient Active Problem List   Diagnosis Date Noted  . Nontraumatic subcortical hemorrhage of left cerebral hemisphere ([place]) 08/27/2019  . Acute blood loss anemia   . Hypoalbuminemia due to protein-calorie malnutrition ([place])   . Thrombocytopenia ([place])   . Dysphagia 07/29/2019  . Infarction of left basal ganglia ([place]) 07/18/2019  . Hypernatremia   . Leukocytosis   . Essential hypertension   . Global aphasia   . Cytotoxic brain edema ([place]) 07/10/2019  . ICH (intracerebral hemorrhage) ([place]) 07/09/2019  . Anxiety state 02/21/2015  . Cephalalgia 12/21/2014    [facility] 09/30/2019, 11:37 AM  [facility] [address] [place], [place], [address] Phone: [phone]   Fax:  [phone]  Name: Erilynn Hyden MRN: [id number] Date of Birth: 09-24-1965   Vianne Bulls, OTR/L [facility] [address] [place], [place]  [address] [phone] phone [phone] 09/30/19 12:07 PM

## 2019-09-30 NOTE — Therapy (Signed)
[facility] [address] [place], [place], [address] Phone: [phone]   Fax:  [phone]  Speech Language Pathology Treatment  Patient Details  Name: Melanie Madden MRN: [id number] Date of Birth: 1966-03-09 Referring Provider (SLP): Alysia Penna, MD   Encounter Date: 09/30/2019  End of Session - 09/30/19 1521    Visit Number  10    Number of Visits  25    Date for SLP Re-Evaluation  12/04/19    Authorization Type  cigna    Authorization Time Period  4 DAYS of therapy regardless of number disciplines per day    SLP Start Time  0933    SLP Stop Time   1015    SLP Time Calculation (min)  42 min    Activity Tolerance  Patient tolerated treatment well       Past Medical History:  Diagnosis Date  . Allergy   . Anxiety   . Arthritis   . High triglycerides   . Kidney stones   . Migraines   . Recurrent sinus infections     Past Surgical History:  Procedure Laterality Date  . ANTERIOR CRUCIATE LIGAMENT REPAIR Left   . BUNIONECTOMY Right   . LASIK    . LITHOTRIPSY    . MENISCUS REPAIR Left   . NASAL SEPTUM SURGERY    . SHOULDER SURGERY    . VARICOSE VEIN SURGERY Left     There were no vitals filed for this visit.  Subjective Assessment - 09/30/19 0940    Subjective  "Strawberries, blueberries, banana, coconut milk - - " [name] continued, but pt twice shouted at, and twice sharply pointed at husband as he assisted communicating what ingredients pt has in her smoothie.    Patient is accompained by:  --   [name], husband   Currently in Pain?  No/denies            ADULT SLP TREATMENT - 09/30/19 0940      General Information   Behavior/Cognition  Alert;Distractible;Doesn't follow directions;Impulsive;Agitated;Uncooperative      Treatment Provided   Treatment provided  Cognitive-Linquistic      Cognitive-Linquistic Treatment   Treatment focused on  Aphasia;Apraxia;Patient/family/caregiver  education    Skilled Treatment  SLP ?s pt's coognitive status - mostly her awareness of her verbal deficit, given her response to husband's assistance later in session.  Pt frequently responds in a frustrated tone when husband interjects, necessaarily, into ST session. Pt's frustration level will have to reduce for more improved prognosis for return of any speech/language ability. SLP used Lingraphica today to begin to educate pt about the likely need for augmentative communication in the future. Initially pt appeared resistant as she threw a PT device she had at home on the table in front of the [place] when SLP brought it out. SLP used Lingraphica to note what SLP had for breakfast this morning and then asked pt again. Pt spontaneously scrolled through the page and accessed "smoothie" - pt did not have smoothie this AM but does often have them, per husband, shortly after pt accessed the smoothie icon. SLP moved to "fruit" page and pt req'd max/total A to indicate what fruits pt has in her smoothie. As pt scrolled she accidentally accessed icons and demonstrated decr'd awareness (vs. reduced auditory comprehension?) as 75% of the time failed to indicate that she knew she had accessed the wrong fruit. "S" occurred when husband began to tell SLP what fruits were in pt's smoothies. Throughout session  SLP explained to pt/husband that SGD makes it easier to communicate with pt, and easier for pt to communicate with listener. At the very end of session SLP shared pt may need a SGD to assist her to communicate and pt again shouted and gestured towards husband indistinctly. SLP and husband attempted to ascertain what pt wanted to communicate but pt continued to shake head "no" until SLP stated, "you're frustrated that you can't communicate as easily as you could?" and pt nodded. Pt became tearful. SLP assured pt to cont to work at home and with SLP on talking, and that in the future we might find tht the SGD is very  helpful for her.       Assessment / Recommendations / Plan   Plan  Continue with current plan of care      Progression Toward Goals   Progression toward goals  Not progressing toward goals (comment)   pt frustration level, and not allowing husband's help      SLP Education - 09/30/19 1520    Education Details  how to operate SGD, SGD might be helpful for pt in the future    Person(s) Educated  Patient;Spouse    Methods  Explanation;Demonstration;Verbal cues    Comprehension  Verbalized understanding;Returned demonstration;Verbal cues required;Need further instruction       SLP Short Term Goals - 09/30/19 [place] #1   Title  pt will simultaneously with SLP produce 3 different phonemes 50% of the time over three sessions    Time  2    Period  Weeks    Status  On-going      SLP SHORT TERM GOAL #2   Title  pt will ID objects salient to pt in f:4 80% of the time over 3 sessions    Time  2    Period  Weeks    Status  On-going      SLP SHORT TERM GOAL #3   Title  pt will write her full name on first attempt over three sessions    Time  2    Period  Weeks    Status  On-going      SLP SHORT TERM GOAL #4   Title  pt will attempt expressive communication via multimodal means during 5 sessions    Time  2    Period  Weeks    Status  On-going      SLP SHORT TERM GOAL #5   Title  pt will demonstrate undertanding of simple 1-step functional commands with 70% accuracy with multimodal cues over 3 sessions    Time  2    Period  Weeks    Status  On-going      SLP SHORT TERM GOAL #6   Title  pt will look at object or line drawing and match the correct word from f:6 words 90% success over 3 sessions    Time  2    Period  Weeks    Status  On-going       SLP Long Term Goals - 09/30/19 1659      SLP LONG TERM GOAL #1   Title  pt's diet will be appropriately upgraded as needed by SLP    Time  8    Period  Weeks   or 25 total visits, for all LTGs   Status   On-going      SLP LONG TERM GOAL #2   Title  pt  will produce 5 simple words/words pertinent to pt (family or pet names, etc) simultaneously with SLP and multimodal cues over three sessions    Time  8    Period  Weeks    Status  On-going      SLP LONG TERM GOAL #3   Title  pt will demo understanding of simple yes/no questions pertinent to desires/wants with multimodal cues 90% of the time over 3 sessions    Time  8    Period  Weeks    Status  On-going      SLP LONG TERM GOAL #4   Title  a device trial with a speech generating device will be initiated within the first 20 ST visits    Time  8    Period  Weeks    Status  On-going      SLP LONG TERM GOAL #5   Title  pt will demo recognition of written word for a common object 100% success over 3 sessions    Time  8    Period  Weeks    Status  On-going       Plan - 09/30/19 1522    Clinical Impression Statement  Cont with no complaints with dys III diet/thin, to SLP knowledge. Pt demonstrted low frustration tolerance today re: husband assisting pt in communication (which was non-functional). Pt also demonstrated cognitive communication deficits in at least the areas of attention (incr'd impulsivity) and awareness (unaware of verbal deficits -vs/in addition to. receptive languge deficit). Pt cont to present with a severe/profound expressive aphasia with verbal apraxia, and severe/profound receptive aphasia ("global aphasia"). See "skilled treatment" for more details of today's session in which SLP introduced Lingraphica to pt and husband. SLP reiterates SLP must have full pt effort and decr'd frustration for most success possible. Pt did not complete entire WAB due to frustration of expressive communication. At some point pt may benefit from a speech generating device. She would cont to benefit from skilled ST focusing on incr'ing communicative effectiveness via multimodal means, incr'ing cognition in functional ways as able given pt's language  deficit, and monitoring/upgrading pt's diet as appropriate.    Speech Therapy Frequency  2x / week    Duration  --   8 weeks   Treatment/Interventions  Diet toleration management by SLP;Trials of upgraded texture/liquids;Language facilitation;Internal/external aids;Cognitive reorganization;Cueing hierarchy;SLP instruction and feedback;Patient/family education;Compensatory strategies;Multimodal communcation approach;Functional tasks;Environmental controls    Potential to Achieve Goals  Fair    Potential Considerations  Severity of impairments;Cooperation/participation level       Patient will benefit from skilled therapeutic intervention in order to improve the following deficits and impairments:   Aphasia  Verbal apraxia  Cognitive communication deficit  Dysphagia, unspecified type    Problem List Patient Active Problem List   Diagnosis Date Noted  . Nontraumatic subcortical hemorrhage of left cerebral hemisphere ([place]) 08/27/2019  . Acute blood loss anemia   . Hypoalbuminemia due to protein-calorie malnutrition ([place])   . Thrombocytopenia ([place])   . Dysphagia 07/29/2019  . Infarction of left basal ganglia ([place]) 07/18/2019  . Hypernatremia   . Leukocytosis   . Essential hypertension   . Global aphasia   . Cytotoxic brain edema ([place]) 07/10/2019  . ICH (intracerebral hemorrhage) ([place]) 07/09/2019  . Anxiety state 02/21/2015  . Cephalalgia 12/21/2014    Melanie Madden ,MS, CCC-SLP  09/30/2019, 4:59 PM  [facility] [address] [place], [place], [address] Phone: [phone]   Fax:  [phone]  Name: Melanie Madden MRN: [id number] Date of Birth: [birth year]

## 2019-10-01 ENCOUNTER — Telehealth: Payer: Self-pay | Admitting: *Deleted

## 2019-10-01 NOTE — Telephone Encounter (Signed)
Rx sent to [facility], [facility], for an E-stim unit.

## 2019-10-05 ENCOUNTER — Ambulatory Visit: Payer: Managed Care, Other (non HMO)

## 2019-10-05 ENCOUNTER — Ambulatory Visit: Payer: Managed Care, Other (non HMO) | Admitting: Occupational Therapy

## 2019-10-05 ENCOUNTER — Other Ambulatory Visit: Payer: Self-pay

## 2019-10-05 DIAGNOSIS — I69151 Hemiplegia and hemiparesis following nontraumatic intracerebral hemorrhage affecting right dominant side: Secondary | ICD-10-CM

## 2019-10-05 DIAGNOSIS — M6281 Muscle weakness (generalized): Secondary | ICD-10-CM | POA: Diagnosis not present

## 2019-10-05 DIAGNOSIS — R482 Apraxia: Secondary | ICD-10-CM

## 2019-10-05 DIAGNOSIS — R1310 Dysphagia, unspecified: Secondary | ICD-10-CM

## 2019-10-05 DIAGNOSIS — R4701 Aphasia: Secondary | ICD-10-CM

## 2019-10-05 DIAGNOSIS — R41841 Cognitive communication deficit: Secondary | ICD-10-CM

## 2019-10-05 DIAGNOSIS — R2689 Other abnormalities of gait and mobility: Secondary | ICD-10-CM

## 2019-10-05 DIAGNOSIS — R208 Other disturbances of skin sensation: Secondary | ICD-10-CM

## 2019-10-05 NOTE — Therapy (Signed)
[facility] [address] [place], [place], [address] Phone: [phone]   Fax:  [phone]  Speech Language Pathology Treatment  Patient Details  Name: Melanie Madden MRN: [id number] Date of Birth: March 03, 1966 Referring Provider (SLP): Alysia Penna, MD   Encounter Date: 10/05/2019  End of Session - 10/05/19 1316    Visit Number  11    Number of Visits  25    Date for SLP Re-Evaluation  12/04/19    Authorization Type  cigna    Authorization Time Period  60 DAYS of therapy regardless of number disciplines per day    SLP Start Time  0934    SLP Stop Time   1015    SLP Time Calculation (min)  41 min    Activity Tolerance  Patient tolerated treatment well       Past Medical History:  Diagnosis Date  . Allergy   . Anxiety   . Arthritis   . High triglycerides   . Kidney stones   . Migraines   . Recurrent sinus infections     Past Surgical History:  Procedure Laterality Date  . ANTERIOR CRUCIATE LIGAMENT REPAIR Left   . BUNIONECTOMY Right   . LASIK    . LITHOTRIPSY    . MENISCUS REPAIR Left   . NASAL SEPTUM SURGERY    . SHOULDER SURGERY    . VARICOSE VEIN SURGERY Left     There were no vitals filed for this visit.  Subjective Assessment - 10/05/19 0941    Subjective  "Dahhh Jacklynn Bue." ("how was the weekend?" - SLP)    Currently in Pain?  No/denies            ADULT SLP TREATMENT - 10/05/19 0941      General Information   Behavior/Cognition  Alert;Distractible;Doesn't follow directions;Impulsive;Agitated;Uncooperative      Treatment Provided   Treatment provided  Cognitive-Linquistic      Cognitive-Linquistic Treatment   Treatment focused on  Aphasia;Apraxia;Patient/family/caregiver education    Skilled Treatment  Pt husband stated pt "did not want to work with (Talk Path)" since last session. With pt's "S" response as nonfunctional, SLP encouraged pt for gestural response (by providing model for  thumbs up, down, or "so-so") and pt gave thumbs up with extra time. SLP worked wth pt today with simple "Anselmo" questions - accuracy 50%, increaesed to 80% with written cues and cues for pt to stop and look at all answers prior to answering (pointing to correct answer). With simple directions with superlatives ("heavier", "warmer", "most"), pt success was 60%, improved to 78% with max cues. SLP made sure to tell pt in 3 differet ways pt was going to need to work on Programme researcher, broadcasting/film/video at home as well as speech ("M" and "H") in order to improve understanding of langauge.       Assessment / Recommendations / Plan   Plan  Continue with current plan of care       SLP Education - 10/05/19 1316    Education Details  how to assist pt at home with talk path    Person(s) Educated  Spouse;Patient    Methods  Explanation;Demonstration    Comprehension  Verbalized understanding       SLP Short Term Goals - 10/05/19 1319      SLP SHORT TERM GOAL #1   Title  pt will simultaneously with SLP produce 3 different phonemes 50% of the time over three sessions    Time  1  Period  Weeks    Status  On-going      SLP SHORT TERM GOAL #2   Title  pt will ID objects salient to pt in f:4 80% of the time over 3 sessions    Time  1    Period  Weeks    Status  On-going      SLP SHORT TERM GOAL #3   Title  pt will write her full name on first attempt over three sessions    Time  1    Period  Weeks    Status  On-going      SLP SHORT TERM GOAL #4   Title  pt will attempt expressive communication via multimodal means during 5 sessions    Time  1    Period  Weeks    Status  On-going      SLP SHORT TERM GOAL #5   Title  pt will demonstrate undertanding of simple 1-step functional commands with 70% accuracy with multimodal cues over 3 sessions    Time  1    Period  Weeks    Status  On-going      SLP SHORT TERM GOAL #6   Title  pt will look at object or line drawing and match the correct word from f:6 words  90% success over 3 sessions    Time  1    Period  Weeks    Status  On-going       SLP Long Term Goals - 10/05/19 1320      SLP LONG TERM GOAL #1   Title  pt's diet will be appropriately upgraded as needed by SLP    Time  7    Period  Weeks   or 25 total visits, for all LTGs   Status  On-going      SLP LONG TERM GOAL #2   Title  pt will produce 5 simple words/words pertinent to pt (family or pet names, etc) simultaneously with SLP and multimodal cues over three sessions    Time  7    Period  Weeks    Status  On-going      SLP LONG TERM GOAL #3   Title  pt will demo understanding of simple yes/no questions pertinent to desires/wants with multimodal cues 90% of the time over 3 sessions    Time  7    Period  Weeks    Status  On-going      SLP LONG TERM GOAL #4   Title  a device trial with a speech generating device will be initiated within the first 20 ST visits    Time  7    Period  Weeks    Status  On-going      SLP LONG TERM GOAL #5   Title  pt will demo recognition of written word for a common object 100% success over 3 sessions    Time  7    Period  Weeks    Status  On-going       Plan - 10/05/19 1317    Clinical Impression Statement  Cont with no complaints with dys III diet/thin, to SLP knowledge. Husband shared that pt desired little work on Environmental health practitioner at home since last session (5 days ago). SLP shared with pt in a number of ways today she will need to do tasks like Talk PAth and husband may need to assist pt, in order for her to make more progress with her speech and  langauge. Pt cont to demo cognitive communication deficits in at least the areas of attention (incr'd impulsivity) and awareness (unaware of verbal deficits -vs/in addition to receptive languge deficit). Pt cont to present with a severe/profound expressive aphasia with verbal apraxia, and severe/profound receptive aphasia ("global aphasia"). See "skilled treatment" for more details of today's session in  which SLP introduced Lingraphica to pt and husband. SLP reiterates SLP must have full pt effort and decr'd frustration for most success possible. Pt did not complete entire WAB due to frustration of expressive communication. At some point pt may benefit from a speech generating device. She would cont to benefit from skilled ST focusing on incr'ing communicative effectiveness via multimodal means, incr'ing cognition in functional ways as able given pt's language deficit, and monitoring/upgrading pt's diet as appropriate.    Speech Therapy Frequency  2x / week    Duration  --   8 weeks   Treatment/Interventions  Diet toleration management by SLP;Trials of upgraded texture/liquids;Language facilitation;Internal/external aids;Cognitive reorganization;Cueing hierarchy;SLP instruction and feedback;Patient/family education;Compensatory strategies;Multimodal communcation approach;Functional tasks;Environmental controls    Potential to Achieve Goals  Fair    Potential Considerations  Severity of impairments;Cooperation/participation level       Patient will benefit from skilled therapeutic intervention in order to improve the following deficits and impairments:   Aphasia  Verbal apraxia  Cognitive communication deficit  Dysphagia, unspecified type    Problem List Patient Active Problem List   Diagnosis Date Noted  . Nontraumatic subcortical hemorrhage of left cerebral hemisphere ([place]) 08/27/2019  . Acute blood loss anemia   . Hypoalbuminemia due to protein-calorie malnutrition ([place])   . Thrombocytopenia ([place])   . Dysphagia 07/29/2019  . Infarction of left basal ganglia ([place]) 07/18/2019  . Hypernatremia   . Leukocytosis   . Essential hypertension   . Global aphasia   . Cytotoxic brain edema ([place]) 07/10/2019  . ICH (intracerebral hemorrhage) ([place]) 07/09/2019  . Anxiety state 02/21/2015  . Cephalalgia 12/21/2014    [place] ,[facility], [place]  10/05/2019, 1:20 PM  [facility] [address] [place], [place], [address] Phone: [phone]   Fax:  [phone]   Name: Melanie Madden MRN: [id number] Date of Birth: 1966/04/20

## 2019-10-05 NOTE — Therapy (Signed)
[facility] [address] [place], [place], [address] Phone: [phone]   Fax:  [phone]  Physical Therapy Treatment  Patient Details  Name: Melanie Madden MRN: [id number] Date of Birth: February 21, 1966 Referring Provider (PT): Lauraine Rinne, [place]   Encounter Date: 10/05/2019  PT End of Session - 10/05/19 1148    Visit Number  10    Number of Visits  20    Date for PT Re-Evaluation  11/24/19    Authorization Type  [employer]- 60 VL combined PT/OT/ST - counts as 1 visit if seen on same day.  Not sure if 60 is hard max.  $50 copay each day - make sure PT/OT/ST on same day if possible    Authorization - Visit Number  12   PT/OT/ST days combined   Authorization - Number of Visits  20    PT Start Time  1102    PT Stop Time  1148    PT Time Calculation (min)  46 min    Equipment Utilized During Treatment  --   pt's R AFO; added forefront wedge   Activity Tolerance  Patient tolerated treatment well    Behavior During Therapy  Impulsive       Past Medical History:  Diagnosis Date  . Allergy   . Anxiety   . Arthritis   . High triglycerides   . Kidney stones   . Migraines   . Recurrent sinus infections     Past Surgical History:  Procedure Laterality Date  . ANTERIOR CRUCIATE LIGAMENT REPAIR Left   . BUNIONECTOMY Right   . LASIK    . LITHOTRIPSY    . MENISCUS REPAIR Left   . NASAL SEPTUM SURGERY    . SHOULDER SURGERY    . VARICOSE VEIN SURGERY Left     There were no vitals filed for this visit.  Subjective Assessment - 10/05/19 1738    Subjective  Pt just finished with OT. No new reports. Husband reports that they have been trying to work on  exercises. States pt likes to focus on leg the most.    Patient is accompained by:  Family member    Pertinent History  anxiety, migraines, HTN    Patient Stated Goals  Per husband report they would like her to get stronger on right, improve mobility, and work on speech.    Currently in Pain?  No/denies                       [facility] Adult PT Treatment/Exercise - 10/05/19 1142      Bed Mobility   Bed Mobility  Rolling Left;Sit to Supine;Supine to Sit    Rolling Left  Minimal Assistance - Patient > 75%   PT assisted to bring right arm across body   Supine to Sit  Supervision/Verbal cueing    Sit to Supine  Minimal Assistance - Patient > 75%   pt assisted right leg on to mat with left arm     Transfers   Transfers  Sit to Stand;Stand to Sit;Stand Pivot Transfers    Sit to Stand  4: Min assist    Stand to Sit  4: Min assist    Stand Pivot Transfers  4: Min assist    Stand Pivot Transfer Details (indicate cue type and reason)  mat to w/c left    Comments  Pt impulsive with transfers and needs assist for safety.      Ambulation/Gait  Ambulation/Gait  Yes    Ambulation/Gait Assistance  3: Mod assist;2: Max assist    Ambulation/Gait Assistance Details  rehab tech at trunk to assist for safety while PT assisting at right leg. Pt was able to help initiate right hip flexion at times but impulsive stepping too fast with left foot. Pt was cued to slow down and try to take bigger steps with left foot past right foot. Mod/max assist at right leg to advance safely. Pt had right AFO and heel lift with danceskin attached to front of shoe to try to get more knee extension during standce.    Ambulation Distance (Feet)  115 Feet    Assistive device  Rolling walker   right hand grip attachment   Gait Pattern  Step-through pattern;Decreased hip/knee flexion - right;Poor foot clearance - right    Ambulation Surface  Level;Indoor      Neuro Re-ed    Neuro Re-ed Details   Sidelying left with powder board between legs for AAROM right leg: right hip flexion/ext x 15 with PT providing min assist in to extension. Pt was able to flex hip to about 80 degrees on board. Right knee flexion/ext on board with min assist in to flexion and mod/max assist in to extension x 10.  Supine: right leg on red physioball hip flexion/ext x 10 with PT stabilizing at hip and giving verbal cues to push down in to extension. Bilateral legs on red physioball with PT stabilizing right hip pt performing bridges x 20 with verbal and tactile cues to lift right hip. Supine right hip abd/add with PT supporting under leg x 10 min assist. Husband observing throughout and will instructed to assist pt at home with bridging on physioball, hip flex/ext on physioball, supine hip abduction. Husband also going to try to build powder board at home for patient to try. Standing at [facility] with right hand in hand grip attachment working on left step forward and back trying to get patient to step past right foot. PT stabilizing at right knee with increased flexion with right AFO donned. Peformed x 6 with PT cuing patient to slow down. PT added heel lift to outside of front of shoe with dancesock holding in place and to allow to slide and again had patient work on left stepping forward and back with less assist needed at right knee as lift helped get some extension.             PT Education - 10/05/19 1740    Education Details  Pt and husband instructed to perform bridging over physioball, right hip flex/ext on physioball and supine assisted abd/add x 10. Husband also going to work on trying to create a powder board for home.    Person(s) Educated  Patient;Spouse    Methods  Explanation;Demonstration    Comprehension  Verbalized understanding       PT Short Term Goals - 10/05/19 1743      PT SHORT TERM GOAL #1   Title  Pt will be able to perform initial HEP with assist of husband for improved function.    Baseline  Husband reports they have been working on exercises.    Time  4    Period  Weeks    Status  Achieved    Target Date  09/25/19      PT SHORT TERM GOAL #2   Title  Pt will transfer w/c to/from mat with slideboard CGA with assist to set up slideboard for improved mobility.  Baseline   performing squat pivots and stand pivots    Time  4    Period  Weeks    Status  Achieved    Target Date  09/25/19      PT SHORT TERM GOAL #3   Title  Pt will be able to stand x 2 min mod assist with PT blocking right knee for improved standing balance.    Time  4    Period  Weeks    Status  Achieved    Target Date  09/25/19      PT SHORT TERM GOAL #4   Title  Pt will perform stand/pivot transfer mod assist of husband to allow improved safety with transfers at home.    Time  4    Period  Weeks    Status  Achieved    Target Date  09/25/19      PT SHORT TERM GOAL #5   Title  Pt will perform all bed mobility min assist or less for improved mobility.    Time  4    Period  Weeks    Status  Partially Met    Target Date  09/25/19      PT SHORT TERM GOAL #6   Title  Pt will propel w/c  100' supervision for improved mobility in home.    Time  4    Period  Weeks    Status  Achieved    Target Date  09/25/19        PT Long Term Goals - 08/26/19 0841      PT LONG TERM GOAL #1   Title  Pt will be able to perform progressive HEP for strengthening, balance and mobility with assist of husband to continued gains at home.    Time  10    Period  Weeks    Status  New    Target Date  11/04/19      PT LONG TERM GOAL #2   Title  Pt will be able to perform all transfers min assist for improved safety with mobility.    Time  10    Period  Weeks    Status  New    Target Date  11/04/19      PT LONG TERM GOAL #3   Title  Pt will be able to ambulate 10' with max assist for improved access to bathroom at home with husband with appropriate AD.    Time  10    Period  Weeks    Status  New    Target Date  11/04/19            Plan - 10/05/19 1744    Clinical Impression Statement  Pt was able to show more active movement in gravity minimized position today. Pt does seem to benefit from wedge on front of shoe as facilitates more knee extension. Pt continues to need cues to slow down as  is impulsive with activities.    Personal Factors and Comorbidities  Comorbidity 3+    Comorbidities  anxiety, migraines, HTN    Examination-Activity Limitations  Bathing;Bed Mobility;Stand;Locomotion Level;Squat;Stairs;Dressing;Transfers    Examination-Participation Restrictions  Community Activity;Driving;Shop;Laundry;Meal Prep    Stability/Clinical Decision Making  Evolving/Moderate complexity    Rehab Potential  Fair    PT Frequency  2x / week    PT Duration  Other (comment)   10 weeks   PT Treatment/Interventions  ADLs/Self Care Home Management;Electrical Stimulation;DME Instruction;Gait training;Stair training;Functional mobility training;Therapeutic activities;Therapeutic exercise;Orthotic Fit/Training;Patient/family education;Neuromuscular  re-education;Balance training;Manual techniques;Passive range of motion;Vestibular;Wheelchair mobility training    PT Next Visit Plan  Assess vitals prior to therapy. Give picture for bridging over physioball, right hip flex/ext on physioball and supine assisted abd/add .  Tape wedge to front of shoe to promote knee extension; Seated weight shifting to L, sit > stand on Steady reaching up and to the L.  Mirror therapy - although may be confusing or upsetting due to visual impairment in R lower quadrant?  Use of powderboard, supine exercises (possibly add these to HEP); sit<>stand with block under L foot to shift weight to R, Increasing R stance time for increased swing with LLE    Consulted and Agree with Plan of Care  Patient;Family member/caregiver    Family Member Consulted  husband       Patient will benefit from skilled therapeutic intervention in order to improve the following deficits and impairments:  Abnormal gait, Decreased activity tolerance, Decreased balance, Decreased cognition, Decreased knowledge of use of DME, Decreased mobility, Decreased range of motion, Decreased safety awareness, Decreased strength, Impaired sensation  Visit  Diagnosis: Other abnormalities of gait and mobility  Muscle weakness (generalized)     Problem List Patient Active Problem List   Diagnosis Date Noted  . Nontraumatic subcortical hemorrhage of left cerebral hemisphere ([place]) 08/27/2019  . Acute blood loss anemia   . Hypoalbuminemia due to protein-calorie malnutrition ([place])   . Thrombocytopenia ([place])   . Dysphagia 07/29/2019  . Infarction of left basal ganglia ([place]) 07/18/2019  . Hypernatremia   . Leukocytosis   . Essential hypertension   . Global aphasia   . Cytotoxic brain edema ([place]) 07/10/2019  . ICH (intracerebral hemorrhage) ([place]) 07/09/2019  . Anxiety state 02/21/2015  . Cephalalgia 12/21/2014    Electa Sniff, PT, DPT, NCS 10/05/2019, 5:47 PM  [facility] [address] [place], [place], [address] Phone: [phone]   Fax:  [phone]  Name: Melanie Madden MRN: [id number] Date of Birth: Dec 08, 1965

## 2019-10-05 NOTE — Patient Instructions (Signed)
Practice "H" and "M" at home. Please work on [place] at home. [name] will need to assist you sometimes. Cognition - memory - recent memory - sentences levels 1 and 2 Langauge - understanding - sentence comprehension level 1-2

## 2019-10-05 NOTE — Therapy (Signed)
[facility] [address] [place], [place], [address] Phone: [phone]   Fax:  [phone]  Occupational Therapy Treatment  Patient Details  Name: Melanie Madden MRN: [id number] Date of Birth: 1965-08-05 Referring Provider (OT): Dr. Alysia Penna   Encounter Date: 10/05/2019  OT End of Session - 10/05/19 1108    Visit Number  9    Number of Visits  25    Date for OT Re-Evaluation  12/01/19    Authorization Type  [employer] 2021:  No prior auth OT, 60 DAY visit limit (regardless of how many therapies each day).    Authorization - Visit Number  12    Authorization - Number of Visits  38    OT Start Time  1020    OT Stop Time  1100    OT Time Calculation (min)  40 min    Activity Tolerance  Patient tolerated treatment well    Behavior During Therapy  Impulsive       Past Medical History:  Diagnosis Date  . Allergy   . Anxiety   . Arthritis   . High triglycerides   . Kidney stones   . Migraines   . Recurrent sinus infections     Past Surgical History:  Procedure Laterality Date  . ANTERIOR CRUCIATE LIGAMENT REPAIR Left   . BUNIONECTOMY Right   . LASIK    . LITHOTRIPSY    . MENISCUS REPAIR Left   . NASAL SEPTUM SURGERY    . SHOULDER SURGERY    . VARICOSE VEIN SURGERY Left     There were no vitals filed for this visit.  Subjective Assessment - 10/05/19 1022    Patient is accompanied by:  Family member    Pertinent History  Subcortical hemorrhage of R cerbral hemisphere.  PMH:  HTN, anxiety, migraines, hx of shoulder injection approx 2 weeks ago for R shoulder pain, hx of R shoulder surgery (arthroscopic)    Limitations  aphasia, fall risk, impulsive (husband reports hx of OCD)    Special Tests  PER HUSBAND for sit>stand:  pt responds to "big push" with knee blocked on both side    Patient Stated Goals  husband reports: to be able to care for herself more (pt indicates that she onces to return to previous  activities per questioning/gestures)    Currently in Pain?  Yes   unable to rate d/t aphasia   Pain Location  --   points to hand and upper arm       Practiced doffing fleece pull over jacket w/ mod assist (could have done w/ min assist if just a shirt) then practiced donning w/ mod to max assist.  NMES to Rt triceps while performing wt bearing activities over RUE w/ mod facilitation - pt responded better w/ trunk rotation component over full arm/slightly bent arm, vs on elbow.                      OT Short Term Goals - 09/23/19 1025      OT SHORT TERM GOAL #1   Title  Pt/husband will be independent with initial HEP for RUE ROM--check STGs 10/14/19    Time  6    Period  Weeks    Status  On-going      OT SHORT TERM GOAL #2   Title  Pt will perform UB dressing with min A.    Time  6    Period  Weeks  Status  New      OT SHORT TERM GOAL #3   Title  Pt will perform UB bathing with mod A.    Time  6    Period  Weeks    Status  New      OT SHORT TERM GOAL #4   Title  Pt will be able to brush hair with set-up.    Time  6    Period  Weeks    Status  New      OT SHORT TERM GOAL #5   Title  Pt will demo at least 110* shoulder flex PROM in supine without pain for incr ease for ADLs.    Time  6    Period  Weeks    Status  New        OT Long Term Goals - 09/02/19 1229      OT LONG TERM GOAL #1   Title  Pt/husband will be independent with HEP for functional activities for incr participation in IADLs/leisure tasks and for cognition (and incorporating RUE if able).--check LTGs 12/01/19    Time  12    Period  Weeks    Status  New      OT LONG TERM GOAL #2   Title  Pt will perform UB dressing with supervision.    Time  12    Period  Weeks    Status  New      OT LONG TERM GOAL #3   Title  Pt will perform LB dressing with mod A.    Time  12    Period  Weeks    Status  New      OT LONG TERM GOAL #4   Title  Pt will perform UB bathing with min A.     Time  12    Period  Weeks    Status  New      OT LONG TERM GOAL #5   Title  Pt will perform simple cold snack prep from w/c level with min A/set up.    Time  12    Period  Weeks    Status  New      OT LONG TERM GOAL #6   Title  Pt will perform toileting with mod A.    Time  12    Period  Weeks    Status  New            Plan - 10/05/19 1109    Clinical Impression Statement  Pt gradually progressing but continues to demo impulsivity and behavioral changes from stroke affecting participation at times due to frustration.  Pt did demo incr tricep activation with wt. bearing today with repetition, but not consistent.    Occupational performance deficits (Please refer to evaluation for details):  ADL's;IADL's;Leisure;Work;Social Participation    Body Structure / Function / Physical Skills  ADL;ROM;IADL;Sensation;Mobility;Strength;Tone;UE functional use;Decreased knowledge of precautions;Decreased knowledge of use of DME;Coordination;Balance;FMC;GMC    Cognitive Skills  Attention;Temperament/Personality;Understand;Thought;Safety Awareness    Rehab Potential  Good    OT Frequency  2x / week    OT Duration  12 weeks    OT Treatment/Interventions  Self-care/ADL training;Moist Heat;DME and/or AE instruction;Splinting;Therapeutic activities;Aquatic Therapy;Cognitive remediation/compensation;Therapeutic exercise;Cryotherapy;Neuromuscular education;Functional Mobility Training;Passive range of motion;Visual/perceptual remediation/compensation;Patient/family education;Manual Therapy;Electrical Stimulation;Fluidtherapy    Plan  continue to address ADLs/begin checking STGs, estim to triceps with neuro re-ed, light wt. bearing    Consulted and Agree with Plan of Care  Patient;Family member/caregiver    Family  Member Consulted  husband       Patient will benefit from skilled therapeutic intervention in order to improve the following deficits and impairments:   Body Structure / Function / Physical  Skills: ADL, ROM, IADL, Sensation, Mobility, Strength, Tone, UE functional use, Decreased knowledge of precautions, Decreased knowledge of use of DME, Coordination, Balance, FMC, GMC Cognitive Skills: Attention, Temperament/Personality, Understand, Thought, Safety Awareness     Visit Diagnosis: Hemiplegia and hemiparesis following nontraumatic intracerebral hemorrhage affecting right dominant side (HCC)  Muscle weakness (generalized)  Other disturbances of skin sensation    Problem List Patient Active Problem List   Diagnosis Date Noted  . Nontraumatic subcortical hemorrhage of left cerebral hemisphere ([place]) 08/27/2019  . Acute blood loss anemia   . Hypoalbuminemia due to protein-calorie malnutrition ([place])   . Thrombocytopenia ([place])   . Dysphagia 07/29/2019  . Infarction of left basal ganglia ([place]) 07/18/2019  . Hypernatremia   . Leukocytosis   . Essential hypertension   . Global aphasia   . Cytotoxic brain edema ([place]) 07/10/2019  . ICH (intracerebral hemorrhage) ([place]) 07/09/2019  . Anxiety state 02/21/2015  . Cephalalgia 12/21/2014    Carey Bullocks, OTR/L 10/05/2019, 11:11 AM  [facility] [address] [place], [place], [address] Phone: [phone]   Fax:  [phone]  Name: Haylei Debord MRN: [id number] Date of Birth: [birth year]

## 2019-10-07 ENCOUNTER — Encounter: Payer: Self-pay | Admitting: Occupational Therapy

## 2019-10-07 ENCOUNTER — Ambulatory Visit: Payer: Managed Care, Other (non HMO)

## 2019-10-07 ENCOUNTER — Other Ambulatory Visit: Payer: Self-pay

## 2019-10-07 ENCOUNTER — Ambulatory Visit: Payer: Managed Care, Other (non HMO) | Admitting: Occupational Therapy

## 2019-10-07 DIAGNOSIS — I69151 Hemiplegia and hemiparesis following nontraumatic intracerebral hemorrhage affecting right dominant side: Secondary | ICD-10-CM

## 2019-10-07 DIAGNOSIS — M6281 Muscle weakness (generalized): Secondary | ICD-10-CM | POA: Diagnosis not present

## 2019-10-07 DIAGNOSIS — R208 Other disturbances of skin sensation: Secondary | ICD-10-CM

## 2019-10-07 DIAGNOSIS — R4701 Aphasia: Secondary | ICD-10-CM

## 2019-10-07 DIAGNOSIS — R41841 Cognitive communication deficit: Secondary | ICD-10-CM

## 2019-10-07 DIAGNOSIS — R2689 Other abnormalities of gait and mobility: Secondary | ICD-10-CM

## 2019-10-07 DIAGNOSIS — R482 Apraxia: Secondary | ICD-10-CM

## 2019-10-07 DIAGNOSIS — I69219 Unspecified symptoms and signs involving cognitive functions following other nontraumatic intracranial hemorrhage: Secondary | ICD-10-CM

## 2019-10-07 DIAGNOSIS — I69153 Hemiplegia and hemiparesis following nontraumatic intracerebral hemorrhage affecting right non-dominant side: Secondary | ICD-10-CM

## 2019-10-07 DIAGNOSIS — R1310 Dysphagia, unspecified: Secondary | ICD-10-CM

## 2019-10-07 NOTE — Patient Instructions (Signed)
   YOU WILL NEED Melanie Madden to assist you at home.  You need to practice at least 30 minutes every day at home.

## 2019-10-07 NOTE — Therapy (Signed)
[facility] [address] [place], [place], [address] Phone: [phone]   Fax:  [phone]  Speech Language Pathology Treatment  Patient Details  Name: Melanie Madden MRN: [id number] Date of Birth: 09-07-1965 Referring Provider (SLP): Alysia Penna, MD   Encounter Date: 10/07/2019  End of Session - 10/07/19 1222    Visit Number  12    Number of Visits  25    Date for SLP Re-Evaluation  12/04/19    Authorization Type  cigna    Authorization Time Period  60 DAYS of therapy regardless of number disciplines per day    SLP Start Time  0934    SLP Stop Time   1015    SLP Time Calculation (min)  41 min    Activity Tolerance  Patient tolerated treatment well       Past Medical History:  Diagnosis Date  . Allergy   . Anxiety   . Arthritis   . High triglycerides   . Kidney stones   . Migraines   . Recurrent sinus infections     Past Surgical History:  Procedure Laterality Date  . ANTERIOR CRUCIATE LIGAMENT REPAIR Left   . BUNIONECTOMY Right   . LASIK    . LITHOTRIPSY    . MENISCUS REPAIR Left   . NASAL SEPTUM SURGERY    . SHOULDER SURGERY    . VARICOSE VEIN SURGERY Left     There were no vitals filed for this visit.         ADULT SLP TREATMENT - 10/07/19 0936      General Information   Behavior/Cognition  Alert;Distractible;Doesn't follow directions;Impulsive;Agitated;Uncooperative      Treatment Provided   Treatment provided  Cognitive-Linquistic      Pain Assessment   Pain Assessment  --   nodded "yes" then shook head "no",SLP asked and shook head     Cognitive-Linquistic Treatment   Treatment focused on  Aphasia;Apraxia;Patient/family/caregiver education    Skilled Treatment  "Absolutely nothing." (pt husband in response to SLP "what have you all been working on at home?") SLP explained to pt throughout session, in a number of simple ways using gestures and other compensatory means to  augment receptive language, that she HAD TO WORK AT HOME at least 30 minutes a day and she will require assistance from husband. Told pt if she did not do this then x2/week ST was not going to continue to be therapeutically appropriate for pt and d/c would be considered. Pt responded negatively x3/4 times the SLP told pt she would need husband to assist her at home. With receptive ID of /m/ and /h/ 80% accurate. Pt produced "m" and "h" spontaneously 75% success with min A occasionally. SLP ensured pt husband comfortable with assisting pt at home - husband answered affirmatively.       Assessment / Recommendations / Plan   Plan  Continue with current plan of care;Goals updated   added goal for pt participation at home     Progression Toward Goals   Progression toward goals  Not progressing toward goals (comment)       SLP Education - 10/07/19 1221    Education Details  pt will require husband's assistance at home during speech practice    Person(s) Educated  Patient    Methods  Explanation    Comprehension  Verbalized understanding       SLP Short Term Goals - 10/07/19 1239      SLP SHORT TERM GOAL #  1   Title  pt will simultaneously with SLP produce 3 different phonemes 50% of the time over three sessions    Status  Partially Met      SLP SHORT TERM GOAL #2   Title  pt will ID objects salient to pt in f:4 80% of the time over 3 sessions    Status  Deferred      SLP [place] #3   Title  pt will write her full name on first attempt over three sessions    Time  1    Status  Deferred      SLP SHORT TERM GOAL #4   Title  pt will attempt expressive communication via multimodal means during 5 sessions    Status  Not Met      SLP SHORT TERM GOAL #5   Title  pt will demonstrate undertanding of simple 1-step functional commands with 70% accuracy with multimodal cues over 3 sessions    Status  Not Met      SLP SHORT TERM GOAL #6   Title  pt will look at object or line drawing  and match the correct word from f:6 words 90% success over 3 sessions    Status  Deferred       SLP Long Term Goals - 10/07/19 [place] #1   Title  pt's diet will be appropriately upgraded as needed by SLP    Time  7    Period  Weeks   or 25 total visits, for all LTGs   Status  On-going      SLP LONG TERM GOAL #2   Title  pt will produce 5 simple words/words pertinent to pt (family or pet names, etc) simultaneously with SLP and multimodal cues over three sessions    Time  7    Period  Weeks    Status  On-going      SLP LONG TERM GOAL #3   Title  pt will demo understanding of simple yes/no questions pertinent to desires/wants with multimodal cues 90% of the time over 3 sessions    Time  7    Period  Weeks    Status  On-going      SLP LONG TERM GOAL #4   Title  a device trial with a speech generating device will be initiated within the first 20 ST visits    Time  7    Period  Weeks    Status  On-going      SLP LONG TERM GOAL #5   Title  pt will demo recognition of written word for a common object 100% success over 3 sessions    Time  7    Period  Weeks    Status  On-going      Additional Long Term Goals   Additional Long Term Goals  Yes      SLP LONG TERM GOAL #6   Title  pt will work on speech-related tasks at home between 75% of sessions as reported by family and/or noted on [employer] website    Time  7    Period  Weeks    Status  New       Plan - 10/07/19 1222    Clinical Impression Statement  Cont with no complaints with dys III diet/thin, to SLP knowledge. Husband shared that pt did not work at all on speech since last session. PT WILL NEED  TO COMPLY with work at home for [place] to continue, and SLP explained this, with cues in different times and ways, today. SLP again reiterated to pt in a number of ways today she will need to do tasks like [employer] and husband may need to assist pt, in order for her to make more progress with her  speech and langauge. Pt cont to demo cognitive communication deficits in at least the areas of attention (incr'd impulsivity) and awareness (unaware of verbal deficits -vs/in addition to receptive languge deficit). Pt cont to present with a severe/profound expressive aphasia with verbal apraxia, and severe/profound receptive aphasia ("global aphasia"). See "skilled treatment" for more details of today's session. Pt did not complete entire WAB due to her incr'd frustration with expressive communication. At some point pt may benefit from a speech generating device. She would cont to benefit from skilled ST focusing on incr'ing communicative effectiveness via multimodal means and incr'ing functional communication (verbal or nonverbal), incr'ing cognition in functional ways as able given pt's language deficit, and monitoring/upgrading pt's diet as appropriate.Pt knows she will need to participate at home for therapy here to be of any assistance.    Speech Therapy Frequency  2x / week    Duration  --   8 weeks   Treatment/Interventions  Diet toleration management by SLP;Trials of upgraded texture/liquids;Language facilitation;Internal/external aids;Cognitive reorganization;Cueing hierarchy;SLP instruction and feedback;Patient/family education;Compensatory strategies;Multimodal communcation approach;Functional tasks;Environmental controls    Potential to Achieve Goals  Fair    Potential Considerations  Severity of impairments;Cooperation/participation level       Patient will benefit from skilled therapeutic intervention in order to improve the following deficits and impairments:   Aphasia  Verbal apraxia  Cognitive communication deficit  Dysphagia, unspecified type    Problem List Patient Active Problem List   Diagnosis Date Noted  . Nontraumatic subcortical hemorrhage of left cerebral hemisphere ([place]) 08/27/2019  . Acute blood loss anemia   . Hypoalbuminemia due to protein-calorie malnutrition  ([place])   . Thrombocytopenia ([place])   . Dysphagia 07/29/2019  . Infarction of left basal ganglia ([place]) 07/18/2019  . Hypernatremia   . Leukocytosis   . Essential hypertension   . Global aphasia   . Cytotoxic brain edema ([place]) 07/10/2019  . ICH (intracerebral hemorrhage) ([place]) 07/09/2019  . Anxiety state 02/21/2015  . Cephalalgia 12/21/2014    [place] ,[place], [place]  10/07/2019, 12:42 PM  [facility] [address] [place], [place], [address] Phone: [phone]   Fax:  [phone]   Name: Melanie Madden MRN: [id number] Date of Birth: [birth year]

## 2019-10-07 NOTE — Therapy (Signed)
[facility] [address] [place], [place], [address] Phone: [phone]   Fax:  [phone]  Occupational Therapy Treatment  Patient Details  Name: Melanie Madden MRN: [id number] Date of Birth: [birth year] Referring Provider (OT): Dr. Alysia Penna   Encounter Date: 10/07/2019  OT End of Session - 10/07/19 0946    Visit Number  10    Number of Visits  25    Date for OT Re-Evaluation  12/01/19    Authorization Type  Cigna 2021:  No prior auth OT, 60 DAY visit limit (regardless of how many therapies each day).    Authorization - Visit Number  13    Authorization - Number of Visits  72    OT Start Time  1019    OT Stop Time  1102    OT Time Calculation (min)  43 min    Activity Tolerance  Patient tolerated treatment well    Behavior During Therapy  Impulsive       Past Medical History:  Diagnosis Date  . Allergy   . Anxiety   . Arthritis   . High triglycerides   . Kidney stones   . Migraines   . Recurrent sinus infections     Past Surgical History:  Procedure Laterality Date  . ANTERIOR CRUCIATE LIGAMENT REPAIR Left   . BUNIONECTOMY Right   . LASIK    . LITHOTRIPSY    . MENISCUS REPAIR Left   . NASAL SEPTUM SURGERY    . SHOULDER SURGERY    . VARICOSE VEIN SURGERY Left     There were no vitals filed for this visit.  Subjective Assessment - 10/07/19 0945    Subjective   inconsistent responses due to aphasia (gestures only, but inconsistent)    Patient is accompanied by:  Family member    Pertinent History  Subcortical hemorrhage of R cerbral hemisphere.  PMH:  HTN, anxiety, migraines, hx of shoulder injection approx 2 weeks ago for R shoulder pain, hx of R shoulder surgery (arthroscopic)    Limitations  aphasia, fall risk, impulsive (husband reports hx of OCD)    Special Tests  PER HUSBAND for sit>stand:  pt responds to "big push" with knee blocked on both side    Patient Stated Goals  husband reports:  to be able to care for herself more (pt indicates that she onces to return to previous activities per questioning/gestures)    Currently in Pain?  Other (Comment)   possible discomfort inconsistent with stretching, points to arm/hand        Practiced doffing fleece pull over jacket with mod assist to get over head and cueing to remove completely from RUE and reach back to attempt to pull over head (pt distracted).   Sitting, NMES x30min to R triceps, 50pps, 250 pulse width, 10sec cycle, 2sec ramp, intensity=21 while performing the following:   light wt bearing with body on arm movements to incr activation RUE with min-mod assist/facilitation (visual, verbal, tactile cues given for normal movement patterns).  Initially with reach for target with LUE, but pt impulsive with task so modified for pt to reach with LUE with therapist guidance to target for slower/speed and incr control with min cueing given for posture and RUE attention.  Attempted AAROM elbow ext/shoulder flex and stabilization with UE ranger during NMES as well but pt needed max facilitation.   NMES x42min to R wrist/finger extensors, 50pps, 250 pulse width, 10sec cycle, 2sec ramp, intensity=21 with cueing given for  attempts for incr muscle activation while attempting to  "grasp (off cycle) /release (on cycle)" cylinder object with facilitation for grasp.   Then attempted after NMES removed with max facilitation (controlled with wrist movement by therapist) for cylinder object with coban wrapped and to attempt to pinch folded shelf liner (to decr slipping in hand)     OT Education - 10/07/19 1154    Education Details  Recommended hand-over-hand (using LUE to assist RUE) to attempt to grasp and for bathing    Person(s) Educated  Patient;Spouse    Methods  Explanation    Comprehension  Verbalized understanding       OT Short Term Goals - 09/23/19 1025      [place] #1   Title  Pt/husband will be independent with  initial HEP for RUE ROM--check STGs 10/14/19    Time  6    Period  Weeks    Status  On-going      OT SHORT TERM GOAL #2   Title  Pt will perform UB dressing with min A.    Time  6    Period  Weeks    Status  New      OT SHORT TERM GOAL #3   Title  Pt will perform UB bathing with mod A.    Time  6    Period  Weeks    Status  New      OT SHORT TERM GOAL #4   Title  Pt will be able to brush hair with set-up.    Time  6    Period  Weeks    Status  New      OT SHORT TERM GOAL #5   Title  Pt will demo at least 110* shoulder flex PROM in supine without pain for incr ease for ADLs.    Time  6    Period  Weeks    Status  New        OT Long Term Goals - 09/02/19 1229      OT LONG TERM GOAL #1   Title  Pt/husband will be independent with HEP for functional activities for incr participation in IADLs/leisure tasks and for cognition (and incorporating RUE if able).--check LTGs 12/01/19    Time  12    Period  Weeks    Status  New      OT LONG TERM GOAL #2   Title  Pt will perform UB dressing with supervision.    Time  12    Period  Weeks    Status  New      OT LONG TERM GOAL #3   Title  Pt will perform LB dressing with mod A.    Time  12    Period  Weeks    Status  New      OT LONG TERM GOAL #4   Title  Pt will perform UB bathing with min A.    Time  12    Period  Weeks    Status  New      OT LONG TERM GOAL #5   Title  Pt will perform simple cold snack prep from w/c level with min A/set up.    Time  12    Period  Weeks    Status  New      OT LONG TERM GOAL #6   Title  Pt will perform toileting with mod A.    Time  12  Period  Weeks    Status  New            Plan - 10/07/19 0946    Clinical Impression Statement  Pt with improving participation and decr frustration today, but was easily distracted in gym environment.  Pt inconsistent with RUE stabilization for wt. bearing.    Occupational performance deficits (Please refer to evaluation for details):   ADL's;IADL's;Leisure;Work;Social Participation    Body Structure / Function / Physical Skills  ADL;ROM;IADL;Sensation;Mobility;Strength;Tone;UE functional use;Decreased knowledge of precautions;Decreased knowledge of use of DME;Coordination;Balance;FMC;GMC    Cognitive Skills  Attention;Temperament/Personality;Understand;Thought;Safety Awareness    Rehab Potential  Good    OT Frequency  2x / week    OT Duration  12 weeks    OT Treatment/Interventions  Self-care/ADL training;Moist Heat;DME and/or AE instruction;Splinting;Therapeutic activities;Aquatic Therapy;Cognitive remediation/compensation;Therapeutic exercise;Cryotherapy;Neuromuscular education;Functional Mobility Training;Passive range of motion;Visual/perceptual remediation/compensation;Patient/family education;Manual Therapy;Electrical Stimulation;Fluidtherapy    Plan  check STGs, continue to address ADLs, neuro re-ed    Consulted and Agree with Plan of Care  Patient;Family member/caregiver    Family Member Consulted  husband       Patient will benefit from skilled therapeutic intervention in order to improve the following deficits and impairments:   Body Structure / Function / Physical Skills: ADL, ROM, IADL, Sensation, Mobility, Strength, Tone, UE functional use, Decreased knowledge of precautions, Decreased knowledge of use of DME, Coordination, Balance, FMC, GMC Cognitive Skills: Attention, Temperament/Personality, Understand, Thought, Safety Awareness     Visit Diagnosis: Hemiplegia and hemiparesis following nontraumatic intracerebral hemorrhage affecting right dominant side (HCC)  Muscle weakness (generalized)  Other disturbances of skin sensation  Other abnormalities of gait and mobility  Unspecified symptoms and signs involving cognitive functions following other nontraumatic intracranial hemorrhage  Hemiplegia and hemiparesis following nontraumatic intracerebral hemorrhage affecting right non-dominant side  (HCC)    Problem List Patient Active Problem List   Diagnosis Date Noted  . Nontraumatic subcortical hemorrhage of left cerebral hemisphere ([place]) 08/27/2019  . Acute blood loss anemia   . Hypoalbuminemia due to protein-calorie malnutrition ([place])   . Thrombocytopenia ([place])   . Dysphagia 07/29/2019  . Infarction of left basal ganglia ([place]) 07/18/2019  . Hypernatremia   . Leukocytosis   . Essential hypertension   . Global aphasia   . Cytotoxic brain edema ([place]) 07/10/2019  . ICH (intracerebral hemorrhage) ([place]) 07/09/2019  . Anxiety state 02/21/2015  . Cephalalgia 12/21/2014    [facility] 10/07/2019, 12:12 PM  [facility] [address] [place], [place], [address] Phone: [phone]   Fax:  [phone]  Name: Melanie Madden MRN: [id number] Date of Birth: 04-Aug-1965   Vianne Bulls, OTR/L [facility] [address] [place], [place]  [address] [phone] phone [phone] 10/07/19 12:12 PM

## 2019-10-07 NOTE — Therapy (Signed)
[facility] [address] [place], [place], [address] Phone: [phone]   Fax:  [phone]  Physical Therapy Treatment  Patient Details  Name: Melanie Madden  MRN: [id number] Date of Birth: [birth year] Referring Provider (PT): Lauraine Rinne, [place]   Encounter Date: 10/07/2019  PT End of Session - 10/07/19 1107    Visit Number  11    Number of Visits  20    Date for PT Re-Evaluation  11/24/19    Authorization Type  [employer]- 60 VL combined PT/OT/ST - counts as 1 visit if seen on same day.  Not sure if 60 is hard max.  $50 copay each day - make sure PT/OT/ST on same day if possible    Authorization - Visit Number  13   PT/OT/ST days combined   Authorization - Number of Visits  20    PT Start Time  1105    PT Stop Time  1153    PT Time Calculation (min)  48 min    Equipment Utilized During Treatment  Gait belt   pt's R AFO; added forefront wedge   Activity Tolerance  Patient tolerated treatment well    Behavior During Therapy  Impulsive       Past Medical History:  Diagnosis Date  . Allergy   . Anxiety   . Arthritis   . High triglycerides   . Kidney stones   . Migraines   . Recurrent sinus infections     Past Surgical History:  Procedure Laterality Date  . ANTERIOR CRUCIATE LIGAMENT REPAIR Left   . BUNIONECTOMY Right   . LASIK    . LITHOTRIPSY    . MENISCUS REPAIR Left   . NASAL SEPTUM SURGERY    . SHOULDER SURGERY    . VARICOSE VEIN SURGERY Left     There were no vitals filed for this visit.  Subjective Assessment - 10/07/19 1108    Subjective  Pt just finished with OT. Needed to use restroom before starting session.    Patient is accompained by:  Family member    Pertinent History  anxiety, migraines, HTN    Patient Stated Goals  Per husband report they would like her to get stronger on right, improve mobility, and work on speech.    Currently in Pain?  No/denies                        [facility] Adult PT Treatment/Exercise - 10/07/19 1113      Transfers   Transfers  Sit to Stand;Stand to Sit    Sit to Stand  4: Min guard;4: Min assist    Stand to Sit  4: Min Social research officer, government Transfers  4: Min Doctor, general practice Details (indicate cue type and reason)  w/c to/from mat      Ambulation/Gait   Ambulation/Gait  Yes    Ambulation/Gait Assistance  3: Mod assist;2: Max assist    Ambulation/Gait Assistance Details  rehab tech assisting CGA/min assist at trunk and PT providing mod assist at right leg. Pt was cued to slow down and try to focus on each step. Pt was able to initiate right hip flexion to swing right leg through when she did this but not consistent as would speed up at times. Verbal cues to try to increase left step past right foot. PT also helping to keep walker back as patient has difficulty controlling walker.  PT  utlized heel lift fastened to front of right shoe with danceskin to allow to slide easier and to faciliate more right knee extension in stance.    Ambulation Distance (Feet)  115 Feet   115 x 1 after seated rest break   Assistive device  Rolling walker   right grip attachment, right AFO   Gait Pattern  Step-to pattern;Step-through pattern;Decreased step length - right;Decreased step length - left;Decreased stance time - right    Ambulation Surface  Level;Indoor      Neuro Re-ed    Neuro Re-ed Details   Standing at walker with right hand grip: having patient try to advance right foot forward to dot and back. Performed x 8 reps. Pt able to initiate some hip flexion and extension to assist. PT did need to assist with extension some to bring leg back at times min assist. PT also provided cues to slow down. Standing with trying to shift weight more to right leg for more upright posture with PT attempting to facilitate but patient continued to keep weight to the left. Had patient step forward and back with left foot x 8  with PT providing min assist to stabilize right knee as well as trunk which did help to get more right weight shift. Pt was cued to try to increase left step. Pt needs cueing to slow down with activities.      Exercises   Exercises  Other Exercises    Other Exercises   Seated with right AFO removed: attempted right knee extension with foot floating. Pt able to initiate contraction for about 20 degrees of movement. Performed x 10. No active motion noted at ankle.             PT Education - 10/07/19 1506    Education Details  Pt to continue with current HEP with husband. Also discussed adding in seated partial LAQ sitting in chair where leg can hang without brace on. Advised to continue with passive left ankle stretching.    Person(s) Educated  Patient;Spouse    Methods  Explanation;Demonstration    Comprehension  Verbalized understanding       PT Short Term Goals - 10/05/19 1743      PT SHORT TERM GOAL #1   Title  Pt will be able to perform initial HEP with assist of husband for improved function.    Baseline  Husband reports they have been working on exercises.    Time  4    Period  Weeks    Status  Achieved    Target Date  09/25/19      PT SHORT TERM GOAL #2   Title  Pt will transfer w/c to/from mat with slideboard CGA with assist to set up slideboard for improved mobility.    Baseline  performing squat pivots and stand pivots    Time  4    Period  Weeks    Status  Achieved    Target Date  09/25/19      PT SHORT TERM GOAL #3   Title  Pt will be able to stand x 2 min mod assist with PT blocking right knee for improved standing balance.    Time  4    Period  Weeks    Status  Achieved    Target Date  09/25/19      PT SHORT TERM GOAL #4   Title  Pt will perform stand/pivot transfer mod assist of husband to allow improved safety with transfers at home.  Time  4    Period  Weeks    Status  Achieved    Target Date  09/25/19      PT SHORT TERM GOAL #5   Title  Pt  will perform all bed mobility min assist or less for improved mobility.    Time  4    Period  Weeks    Status  Partially Met    Target Date  09/25/19      PT SHORT TERM GOAL #6   Title  Pt will propel w/c  100' supervision for improved mobility in home.    Time  4    Period  Weeks    Status  Achieved    Target Date  09/25/19        PT Long Term Goals - 08/26/19 0841      PT LONG TERM GOAL #1   Title  Pt will be able to perform progressive HEP for strengthening, balance and mobility with assist of husband to continued gains at home.    Time  10    Period  Weeks    Status  New    Target Date  11/04/19      PT LONG TERM GOAL #2   Title  Pt will be able to perform all transfers min assist for improved safety with mobility.    Time  10    Period  Weeks    Status  New    Target Date  11/04/19      PT LONG TERM GOAL #3   Title  Pt will be able to ambulate 10' with max assist for improved access to bathroom at home with husband with appropriate AD.    Time  10    Period  Weeks    Status  New    Target Date  11/04/19            Plan - 10/07/19 1508    Clinical Impression Statement  Pt was able to initiate more active movement on right LE with advancement during gait today. Does need max cuing to slow down due to impulsiveness. When she did slow down had better advancement and bigger left step length as well.    Personal Factors and Comorbidities  Comorbidity 3+    Comorbidities  anxiety, migraines, HTN    Examination-Activity Limitations  Bathing;Bed Mobility;Stand;Locomotion Level;Squat;Stairs;Dressing;Transfers    Examination-Participation Restrictions  Community Activity;Driving;Shop;Laundry;Meal Prep    Stability/Clinical Decision Making  Evolving/Moderate complexity    Rehab Potential  Fair    PT Frequency  2x / week    PT Duration  Other (comment)   10 weeks   PT Treatment/Interventions  ADLs/Self Care Home Management;Electrical Stimulation;DME Instruction;Gait  training;Stair training;Functional mobility training;Therapeutic activities;Therapeutic exercise;Orthotic Fit/Training;Patient/family education;Neuromuscular re-education;Balance training;Manual techniques;Passive range of motion;Vestibular;Wheelchair mobility training    PT Next Visit Plan  Assess vitals prior to therapy. Give picture for bridging over physioball, right hip flex/ext on physioball and supine assisted abd/add .  Tape wedge to front of shoe to promote knee extension; Seated weight shifting to L, sit > stand on Steady reaching up and to the L.  Mirror therapy - although may be confusing or upsetting due to visual impairment in R lower quadrant?  Use of powderboard, supine exercises (possibly add these to HEP); sit<>stand with block under L foot to shift weight to R, Increasing R stance time for increased swing with LLE    Consulted and Agree with Plan of Care  Patient;Family member/caregiver  Family Member Consulted  husband       Patient will benefit from skilled therapeutic intervention in order to improve the following deficits and impairments:  Abnormal gait, Decreased activity tolerance, Decreased balance, Decreased cognition, Decreased knowledge of use of DME, Decreased mobility, Decreased range of motion, Decreased safety awareness, Decreased strength, Impaired sensation  Visit Diagnosis: Muscle weakness (generalized)  Other abnormalities of gait and mobility     Problem List Patient Active Problem List   Diagnosis Date Noted  . Nontraumatic subcortical hemorrhage of left cerebral hemisphere ([place]) 08/27/2019  . Acute blood loss anemia   . Hypoalbuminemia due to protein-calorie malnutrition ([place])   . Thrombocytopenia ([place])   . Dysphagia 07/29/2019  . Infarction of left basal ganglia ([place]) 07/18/2019  . Hypernatremia   . Leukocytosis   . Essential hypertension   . Global aphasia   . Cytotoxic brain edema ([place]) 07/10/2019  . ICH (intracerebral hemorrhage) ([place])  07/09/2019  . Anxiety state 02/21/2015  . Cephalalgia 12/21/2014    Electa Sniff , PT, DPT, NCS 10/07/2019, 3:11 PM  [facility] [address] [place], [place], [address] Phone: [phone]   Fax:  [phone]  Name: Marian Meneely MRN: [id number] Date of Birth: [birth year]

## 2019-10-12 ENCOUNTER — Ambulatory Visit: Payer: Managed Care, Other (non HMO)

## 2019-10-12 ENCOUNTER — Ambulatory Visit: Payer: Managed Care, Other (non HMO) | Admitting: Occupational Therapy

## 2019-10-12 ENCOUNTER — Other Ambulatory Visit: Payer: Self-pay

## 2019-10-12 DIAGNOSIS — M6281 Muscle weakness (generalized): Secondary | ICD-10-CM

## 2019-10-12 DIAGNOSIS — R4701 Aphasia: Secondary | ICD-10-CM

## 2019-10-12 DIAGNOSIS — R482 Apraxia: Secondary | ICD-10-CM

## 2019-10-12 DIAGNOSIS — I69151 Hemiplegia and hemiparesis following nontraumatic intracerebral hemorrhage affecting right dominant side: Secondary | ICD-10-CM

## 2019-10-12 DIAGNOSIS — R1310 Dysphagia, unspecified: Secondary | ICD-10-CM

## 2019-10-12 DIAGNOSIS — R41841 Cognitive communication deficit: Secondary | ICD-10-CM

## 2019-10-12 DIAGNOSIS — R2689 Other abnormalities of gait and mobility: Secondary | ICD-10-CM

## 2019-10-12 NOTE — Patient Instructions (Addendum)
    Recommend at least 30 minutes at least twice a day.   The more you work, the better you will be.

## 2019-10-12 NOTE — Patient Instructions (Signed)
Access Code: [email or web address] URL: [email or web address] Date: 10/12/2019 Prepared by: Cherly Anderson  Exercises Bridge with Arms at [facility] and Feet on Swiss Ball - 1 x daily - 7 x weekly - 2 sets - 10 reps Supine Single Leg Hip and Knee Flexion ROM with Swiss Ball - 1 x daily - 7 x weekly - 2 sets - 10 reps Hip Abduction and Adduction Caregiver PROM - 1 x daily - 7 x weekly - 2 sets - 10 reps

## 2019-10-12 NOTE — Therapy (Signed)
[facility] [address] [place], [place], [address] Phone: [phone]   Fax:  [phone]  Physical Therapy Treatment  Patient Details  Name: Melanie Madden MRN: [id number] Date of Birth: 21-Mar-1966 Referring Provider (PT): Lauraine Rinne, [place]   Encounter Date: 10/12/2019  PT End of Session - 10/12/19 1102    Visit Number  12    Number of Visits  20    Date for PT Re-Evaluation  11/24/19    Authorization Type  [employer]- 60 VL combined PT/OT/ST - counts as 1 visit if seen on same day.  Not sure if 60 is hard max.  $50 copay each day - make sure PT/OT/ST on same day if possible    Authorization - Visit Number  14   PT/OT/ST days combined   Authorization - Number of Visits  20    PT Start Time  1100    PT Stop Time  1148    PT Time Calculation (min)  48 min    Equipment Utilized During Treatment  Gait belt   pt's R AFO; added forefront wedge   Activity Tolerance  Patient tolerated treatment well    Behavior During Therapy  Impulsive       Past Medical History:  Diagnosis Date  . Allergy   . Anxiety   . Arthritis   . High triglycerides   . Kidney stones   . Migraines   . Recurrent sinus infections     Past Surgical History:  Procedure Laterality Date  . ANTERIOR CRUCIATE LIGAMENT REPAIR Left   . BUNIONECTOMY Right   . LASIK    . LITHOTRIPSY    . MENISCUS REPAIR Left   . NASAL SEPTUM SURGERY    . SHOULDER SURGERY    . VARICOSE VEIN SURGERY Left     There were no vitals filed for this visit.  Subjective Assessment - 10/12/19 1101    Subjective  Pt just finished OT. Denies any changes over the weekend.    Patient is accompained by:  Family member    Pertinent History  anxiety, migraines, HTN    Patient Stated Goals  Per husband report they would like her to get stronger on right, improve mobility, and work on speech.    Currently in Pain?  No/denies                       [facility] Adult PT  Treatment/Exercise - 10/12/19 1153      Transfers   Transfers  Sit to Stand;Stand to Sit    Sit to Stand  4: Min guard;4: Min assist    Sit to Stand Details  Verbal cues for technique;Manual facilitation for weight shifting    Sit to Stand Details (indicate cue type and reason)  Pt was cued to push from mat and try to weight shift more to right. PT blocking at right knee for safety but no buckling.    Five time sit to stand comments   Pt performed sit to stand x 5 from edge of mat.    Stand to Sit  4: Min guard    Stand to Sit Details (indicate cue type and reason)  Verbal cues for technique    Stand to Sit Details  Verbal cues to reach back.      Ambulation/Gait   Ambulation/Gait  Yes    Ambulation/Gait Assistance  3: Mod assist    Ambulation/Gait Assistance Details  Rehab tech CGA assist at  trunk for safety. PT CGA to mod assist at right leg with max verbal cueing to go slow and be sure to clear right leg through prior to taking left step. Also cued to increase left step length. PT assisted to keep walker close. Danceskin was applied to front of right shoe during gait but no lift today.    Ambulation Distance (Feet)  230 Feet   60' x 1 out of gym   Assistive device  Rolling walker   right hand grip attachment and right AFO   Gait Pattern  Step-to pattern;Step-through pattern;Decreased hip/knee flexion - right;Decreased stance time - right;Decreased step length - left;Decreased step length - right;Decreased weight shift to right    Ambulation Surface  Level;Indoor      Neuro Re-ed    Neuro Re-ed Details   Supine: right passive/active assisted hip/knee flexion extension x 10 with verbal and tactile cues to pull leg up and push down in to therapist hand. Did note some assist mostly with extension. Bridging x 10 with PT stabilizing at right knee with verbal and tactile cues to lift bottom and try to bring right hip up more. Right SAQ over bolster x 6 with muscle tapping to try to facilitate  with slight contraction noted at times but difficulty isolating quad using hip extension to get more activation. Right hip abd/adduction x 6 with PT supporting under leg compensating with trunk some to attempt to get some activation. Seated with right AFO removed attempting to activate right quad to swing leg and able to get slight contraction with minimal movement x 6. Standing at walker with right hand grip attachment with right AFO stepping right leg forward and back (min assist) x 10. Pt using trunk to help advance. Then switched to stepping foward and back with left foot. Took pt a minute to understand which leg to advance at that time but was able to get larger left step. Then added 2" step in front and had patient tap step with left foot x 10 with PT stabilizing at right knee and to help facilitate weight shift to right.              PT Education - 10/12/19 1225    Education Details  Pt to continue with current HEP    Person(s) Educated  Patient    Methods  Explanation    Comprehension  Verbalized understanding       PT Short Term Goals - 10/05/19 1743      PT SHORT TERM GOAL #1   Title  Pt will be able to perform initial HEP with assist of husband for improved function.    Baseline  Husband reports they have been working on exercises.    Time  4    Period  Weeks    Status  Achieved    Target Date  09/25/19      PT SHORT TERM GOAL #2   Title  Pt will transfer w/c to/from mat with slideboard CGA with assist to set up slideboard for improved mobility.    Baseline  performing squat pivots and stand pivots    Time  4    Period  Weeks    Status  Achieved    Target Date  09/25/19      PT SHORT TERM GOAL #3   Title  Pt will be able to stand x 2 min mod assist with PT blocking right knee for improved standing balance.    Time  4  Period  Weeks    Status  Achieved    Target Date  09/25/19      PT SHORT TERM GOAL #4   Title  Pt will perform stand/pivot transfer mod assist  of husband to allow improved safety with transfers at home.    Time  4    Period  Weeks    Status  Achieved    Target Date  09/25/19      PT SHORT TERM GOAL #5   Title  Pt will perform all bed mobility min assist or less for improved mobility.    Time  4    Period  Weeks    Status  Partially Met    Target Date  09/25/19      PT SHORT TERM GOAL #6   Title  Pt will propel w/c  100' supervision for improved mobility in home.    Time  4    Period  Weeks    Status  Achieved    Target Date  09/25/19        PT Long Term Goals - 08/26/19 0841      PT LONG TERM GOAL #1   Title  Pt will be able to perform progressive HEP for strengthening, balance and mobility with assist of husband to continued gains at home.    Time  10    Period  Weeks    Status  New    Target Date  11/04/19      PT LONG TERM GOAL #2   Title  Pt will be able to perform all transfers min assist for improved safety with mobility.    Time  10    Period  Weeks    Status  New    Target Date  11/04/19      PT LONG TERM GOAL #3   Title  Pt will be able to ambulate 10' with max assist for improved access to bathroom at home with husband with appropriate AD.    Time  10    Period  Weeks    Status  New    Target Date  11/04/19            Plan - 10/12/19 1225    Clinical Impression Statement  PT performed gait without wedge to front of right shoe today and only danceskin to help slide. Pt was able to advance right foot better and did slow down more with cuing today. No buckling noted that required physical assist to stop with gait.    Personal Factors and Comorbidities  Comorbidity 3+    Comorbidities  anxiety, migraines, HTN    Examination-Activity Limitations  Bathing;Bed Mobility;Stand;Locomotion Level;Squat;Stairs;Dressing;Transfers    Examination-Participation Restrictions  Community Activity;Driving;Shop;Laundry;Meal Prep    Stability/Clinical Decision Making  Evolving/Moderate complexity    Rehab  Potential  Fair    PT Frequency  2x / week    PT Duration  Other (comment)   10 weeks   PT Treatment/Interventions  ADLs/Self Care Home Management;Electrical Stimulation;DME Instruction;Gait training;Stair training;Functional mobility training;Therapeutic activities;Therapeutic exercise;Orthotic Fit/Training;Patient/family education;Neuromuscular re-education;Balance training;Manual techniques;Passive range of motion;Vestibular;Wheelchair mobility training    PT Next Visit Plan  Assess vitals prior to therapy. Give picture for bridging over physioball, right hip flex/ext on physioball and supine assisted abd/add .  Tape wedge to front of shoe to promote knee extension; Seated weight shifting to L, sit > stand on Steady reaching up and to the L.  Mirror therapy - although may be confusing or upsetting  due to visual impairment in R lower quadrant?  Use of powderboard, supine exercises (possibly add these to HEP); sit<>stand with block under L foot to shift weight to R, Increasing R stance time for increased swing with LLE    Consulted and Agree with Plan of Care  Patient;Family member/caregiver    Family Member Consulted  husband       Patient will benefit from skilled therapeutic intervention in order to improve the following deficits and impairments:  Abnormal gait, Decreased activity tolerance, Decreased balance, Decreased cognition, Decreased knowledge of use of DME, Decreased mobility, Decreased range of motion, Decreased safety awareness, Decreased strength, Impaired sensation  Visit Diagnosis: Muscle weakness (generalized)  Other abnormalities of gait and mobility     Problem List Patient Active Problem List   Diagnosis Date Noted  . Nontraumatic subcortical hemorrhage of left cerebral hemisphere ([place]) 08/27/2019  . Acute blood loss anemia   . Hypoalbuminemia due to protein-calorie malnutrition ([place])   . Thrombocytopenia ([place])   . Dysphagia 07/29/2019  . Infarction of left basal  ganglia ([place]) 07/18/2019  . Hypernatremia   . Leukocytosis   . Essential hypertension   . Global aphasia   . Cytotoxic brain edema ([place]) 07/10/2019  . ICH (intracerebral hemorrhage) ([place]) 07/09/2019  . Anxiety state 02/21/2015  . Cephalalgia 12/21/2014    Electa Sniff, PT, DPT, NCS 10/12/2019, 12:27 PM  [facility] [address] [place], [place], [address] Phone: [phone]   Fax:  [phone]  Name: Erabella Kuipers MRN: [id number] Date of Birth: 31-Aug-1965

## 2019-10-12 NOTE — Therapy (Signed)
[facility] [address] [place], [place], [address] Phone: [phone]   Fax:  [phone]  Speech Language Pathology Treatment  Patient Details  Name: Melanie Madden MRN: [id number] Date of Birth: September 10, 1965 Referring Provider (SLP): Alysia Penna, MD   Encounter Date: 10/12/2019  End of Session - 10/12/19 2336    Visit Number  13    Number of Visits  25    Date for SLP Re-Evaluation  12/04/19    Authorization Type  cigna    Authorization Time Period  28 DAYS of therapy regardless of number disciplines per day    SLP Start Time  0935    SLP Stop Time   1016    SLP Time Calculation (min)  41 min    Activity Tolerance  Patient tolerated treatment well       Past Medical History:  Diagnosis Date  . Allergy   . Anxiety   . Arthritis   . High triglycerides   . Kidney stones   . Migraines   . Recurrent sinus infections     Past Surgical History:  Procedure Laterality Date  . ANTERIOR CRUCIATE LIGAMENT REPAIR Left   . BUNIONECTOMY Right   . LASIK    . LITHOTRIPSY    . MENISCUS REPAIR Left   . NASAL SEPTUM SURGERY    . SHOULDER SURGERY    . VARICOSE VEIN SURGERY Left     There were no vitals filed for this visit.  Subjective Assessment - 10/12/19 0941    Subjective  "Ehh!" (to husband, meaning "no!")    Patient is accompained by:  --   terry   Currently in Pain?  Yes    Pain Score  7    # board, with explanation of 1=minimal and 10=excrutiating   Pain Location  Arm    Pain Orientation  Right    Pain Descriptors / Indicators  Aching            ADULT SLP TREATMENT - 10/12/19 1359      General Information   Behavior/Cognition  Alert;Cooperative;Agitated;Requires cueing      Treatment Provided   Treatment provided  Cognitive-Linquistic      Cognitive-Linquistic Treatment   Treatment focused on  Aphasia;Apraxia;Patient/family/caregiver education    Skilled Treatment  Pt touching and  pressing rt forearm and whining initially in [place]. (See "pain" today). SLP told pt using key words written cues that she may want to pre-medicate if having pain prior to tx so she does not suffer from distraction due to pain. /m/ and /h/ with model by SLP 83% and 62% respectively. Spontaneous production 100% for /m/ and 42% for /h/ incr'd to 100% with consistent max cues (pt unaware of erroneous production 0%). Husband conveyed to SLP that pt used "hot" sign yesterdy spontaeously. Today, pt generated 15% of signs spontaneously and 30% with extra time and repeats by SLP.  Told pt that least 30 minutes of practice at least twice each day will help her. Husband encouraged SLP to write this down and give to pt to better encourage pt participation.       Assessment / Recommendations / Plan   Plan  Continue with current plan of care      Progression Toward Goals   Progression toward goals  Not progressing toward goals (comment)   pt easily frustrated, only wants to do things she can do wel      SLP Education - 10/12/19 2335  Education Details  at least 30 minutes at least twice a day practice at home necessary    Person(s) Educated  Patient    Methods  Explanation;Handout    Comprehension  Verbalized understanding       SLP Short Term Goals - 10/07/19 1239      SLP SHORT TERM GOAL #1   Title  pt will simultaneously with SLP produce 3 different phonemes 50% of the time over three sessions    Status  Partially Met      SLP SHORT TERM GOAL #2   Title  pt will ID objects salient to pt in f:4 80% of the time over 3 sessions    Status  Deferred      SLP SHORT TERM GOAL #3   Title  pt will write her full name on first attempt over three sessions    Time  1    Status  Deferred      SLP SHORT TERM GOAL #4   Title  pt will attempt expressive communication via multimodal means during 5 sessions    Status  Not Met      SLP SHORT TERM GOAL #5   Title  pt will demonstrate undertanding of simple  1-step functional commands with 70% accuracy with multimodal cues over 3 sessions    Status  Not Met      SLP SHORT TERM GOAL #6   Title  pt will look at object or line drawing and match the correct word from f:6 words 90% success over 3 sessions    Status  Deferred       SLP Long Term Goals - 10/12/19 2338      SLP LONG TERM GOAL #1   Title  pt's diet will be appropriately upgraded as needed by SLP    Time  6    Period  Weeks   or 25 total visits, for all LTGs   Status  On-going      SLP LONG TERM GOAL #2   Title  pt will produce 3 simple words/words pertinent to pt (family or pet names, etc) simultaneously with SLP and multimodal cues over three sessions    Time  6    Period  Weeks    Status  Revised      SLP LONG TERM GOAL #3   Title  pt will demo understanding of simple yes/no questions pertinent to desires/wants with multimodal cues 90% of the time over 3 sessions    Time  6    Period  Weeks    Status  On-going      SLP LONG TERM GOAL #4   Title  a device trial with a speech generating device will be initiated within the first 20 ST visits    Time  6    Period  Weeks    Status  On-going      SLP LONG TERM GOAL #5   Title  pt will demo recognition of written word for a common object 100% success over 3 sessions    Time  6    Period  Weeks    Status  On-going      SLP LONG TERM GOAL #6   Title  pt will work on speech-related tasks at home between 75% of sessions as reported by family and/or noted on [facility] website    Time  6    Period  Weeks    Status  On-going  Plan - 10/12/19 2336    Clinical Impression Statement  Cont with no complaints with dys III diet/thin, to SLP knowledge. Husband shared that pt worked at home on speech and language things this weekend. SLP told pt at least 30 minutes t least twice a day is required. PT WILL NEED TO CONTINUE TO COMPLY with work at home for [place] to continue, and SLP explained this, with cues in different  times and ways, today. Pt cont to demo cognitive communication deficits in at least the areas of attention (incr'd impulsivity) and awareness (unaware of verbal deficits -vs/in addition to receptive languge deficit). Pt cont to present with a severe/profound expressive aphasia with verbal apraxia, and severe/profound receptive aphasia ("global aphasia"). See "skilled treatment" for more details of today's session. Pt did not complete entire WAB due to her incr'd frustration with expressive communication. At some point pt may benefit from a speech generating device. She would cont to benefit from skilled ST focusing on incr'ing communicative effectiveness via multimodal means and incr'ing functional communication (verbal or nonverbal), incr'ing cognition in functional ways as able given pt's language deficit, and monitoring/upgrading pt's diet as appropriate.Pt knows she will need to participate at home for therapy here to be of any assistance.    Speech Therapy Frequency  2x / week    Duration  --   8 weeks   Treatment/Interventions  Diet toleration management by SLP;Trials of upgraded texture/liquids;Language facilitation;Internal/external aids;Cognitive reorganization;Cueing hierarchy;SLP instruction and feedback;Patient/family education;Compensatory strategies;Multimodal communcation approach;Functional tasks;Environmental controls    Potential to Achieve Goals  Fair    Potential Considerations  Severity of impairments;Cooperation/participation level       Patient will benefit from skilled therapeutic intervention in order to improve the following deficits and impairments:   Aphasia  Verbal apraxia  Cognitive communication deficit  Dysphagia, unspecified type    Problem List Patient Active Problem List   Diagnosis Date Noted  . Nontraumatic subcortical hemorrhage of left cerebral hemisphere ([facility]) 08/27/2019  . Acute blood loss anemia   . Hypoalbuminemia due to protein-calorie  malnutrition ([facility])   . Thrombocytopenia ([facility])   . Dysphagia 07/29/2019  . Infarction of left basal ganglia ([facility]) 07/18/2019  . Hypernatremia   . Leukocytosis   . Essential hypertension   . Global aphasia   . Cytotoxic brain edema ([facility]) 07/10/2019  . ICH (intracerebral hemorrhage) ([facility]) 07/09/2019  . Anxiety state 02/21/2015  . Cephalalgia 12/21/2014    [place] ,[place], [place]  10/12/2019, 11:39 PM  [facility] [address] [place], [place], [address] Phone: [phone]   Fax:  [phone]   Name: Melanie Madden MRN: [id number] Date of Birth: 24-Apr-1966

## 2019-10-12 NOTE — Therapy (Signed)
[facility] [address] [place], [place], [address] Phone: [phone]   Fax:  [phone]  Occupational Therapy Treatment  Patient Details  Name: Melanie Madden MRN: [id number] Date of Birth: Jul 14, 1966 Referring Provider (OT): Dr. Alysia Penna   Encounter Date: 10/12/2019  OT End of Session - 10/12/19 1215    Visit Number  11    Number of Visits  25    Date for OT Re-Evaluation  12/01/19    Authorization Type  Cigna 2021:  No prior auth OT, 60 DAY visit limit (regardless of how many therapies each day).    Authorization - Visit Number  14    Authorization - Number of Visits  66    OT Start Time  1025    OT Stop Time  1100    OT Time Calculation (min)  35 min    Activity Tolerance  Patient tolerated treatment well    Behavior During Therapy  Impulsive       Past Medical History:  Diagnosis Date  . Allergy   . Anxiety   . Arthritis   . High triglycerides   . Kidney stones   . Migraines   . Recurrent sinus infections     Past Surgical History:  Procedure Laterality Date  . ANTERIOR CRUCIATE LIGAMENT REPAIR Left   . BUNIONECTOMY Right   . LASIK    . LITHOTRIPSY    . MENISCUS REPAIR Left   . NASAL SEPTUM SURGERY    . SHOULDER SURGERY    . VARICOSE VEIN SURGERY Left     There were no vitals filed for this visit.  Subjective Assessment - 10/12/19 1213    Patient is accompanied by:  Family member    Pertinent History  Subcortical hemorrhage of R cerbral hemisphere.  PMH:  HTN, anxiety, migraines, hx of shoulder injection approx 2 weeks ago for R shoulder pain, hx of R shoulder surgery (arthroscopic)    Limitations  aphasia, fall risk, impulsive (husband reports hx of OCD)    Special Tests  PER HUSBAND for sit>stand:  pt responds to "big push" with knee blocked on both side    Patient Stated Goals  husband reports: to be able to care for herself more (pt indicates that she onces to return to previous  activities per questioning/gestures)    Currently in Pain?  Yes    Pain Score  7    see speech note - used image board. Pt showed no sign of pain when performing gentle P/ROM   Pain Location  Arm    Pain Orientation  Right    Pain Descriptors / Indicators  Aching    Pain Type  Acute pain    Pain Frequency  Intermittent    Aggravating Factors   unknown    Pain Relieving Factors  unknown       Pt's husband brought in estim device (by [facility]) and educated on proper set up and settings for wrist and finger extension for home use. Wrote parameters down for pt/husband. Pt on estim for 15 minutes total (10 sec on/off cycle, 50 pps, 250 pw) while education simultaneously happened.  Assessed STG's and progress to date and continued to reinforce increased independence with BADLS (grooming, dressing, and bathing)                        OT Short Term Goals - 10/12/19 1216      OT SHORT TERM GOAL #  1   Title  Pt/husband will be independent with initial HEP for RUE ROM--check STGs 10/14/19    Time  6    Period  Weeks    Status  Achieved      OT SHORT TERM GOAL #2   Title  Pt will perform UB dressing with min A.    Time  6    Period  Weeks    Status  On-going   MOD ASSIST     OT SHORT TERM GOAL #3   Title  Pt will perform UB bathing with mod A.    Time  6    Period  Weeks    Status  Achieved      OT SHORT TERM GOAL #4   Title  Pt will be able to brush hair with set-up.    Time  6    Period  Weeks    Status  On-going   fluctuating assist needed     OT SHORT TERM GOAL #5   Title  Pt will demo at least 110* shoulder flex PROM in supine without pain for incr ease for ADLs.    Time  6    Period  Weeks    Status  Achieved        OT Long Term Goals - 09/02/19 1229      OT LONG TERM GOAL #1   Title  Pt/husband will be independent with HEP for functional activities for incr participation in IADLs/leisure tasks and for cognition (and incorporating RUE if able).--check  LTGs 12/01/19    Time  12    Period  Weeks    Status  New      OT LONG TERM GOAL #2   Title  Pt will perform UB dressing with supervision.    Time  12    Period  Weeks    Status  New      OT LONG TERM GOAL #3   Title  Pt will perform LB dressing with mod A.    Time  12    Period  Weeks    Status  New      OT LONG TERM GOAL #4   Title  Pt will perform UB bathing with min A.    Time  12    Period  Weeks    Status  New      OT LONG TERM GOAL #5   Title  Pt will perform simple cold snack prep from w/c level with min A/set up.    Time  12    Period  Weeks    Status  New      OT LONG TERM GOAL #6   Title  Pt will perform toileting with mod A.    Time  12    Period  Weeks    Status  New            Plan - 10/12/19 1217    Clinical Impression Statement  Pt met 3/5 STG's. Pt inconsistent w/ RUE activation during wt bearing    Occupational performance deficits (Please refer to evaluation for details):  ADL's;IADL's;Leisure;Work;Social Participation    Body Structure / Function / Physical Skills  ADL;ROM;IADL;Sensation;Mobility;Strength;Tone;UE functional use;Decreased knowledge of precautions;Decreased knowledge of use of DME;Coordination;Balance;FMC;GMC    Cognitive Skills  Attention;Temperament/Personality;Understand;Thought;Safety Awareness    Rehab Potential  Good    OT Frequency  2x / week    OT Duration  12 weeks    OT Treatment/Interventions  Self-care/ADL training;Moist  Heat;DME and/or AE instruction;Splinting;Therapeutic activities;Aquatic Therapy;Cognitive remediation/compensation;Therapeutic exercise;Cryotherapy;Neuromuscular education;Functional Mobility Training;Passive range of motion;Visual/perceptual remediation/compensation;Patient/family education;Manual Therapy;Electrical Stimulation;Fluidtherapy    Plan  continue to address ADLs, neuro re-ed    Consulted and Agree with Plan of Care  Patient;Family member/caregiver    Family Member Consulted  husband        Patient will benefit from skilled therapeutic intervention in order to improve the following deficits and impairments:   Body Structure / Function / Physical Skills: ADL, ROM, IADL, Sensation, Mobility, Strength, Tone, UE functional use, Decreased knowledge of precautions, Decreased knowledge of use of DME, Coordination, Balance, FMC, GMC Cognitive Skills: Attention, Temperament/Personality, Understand, Thought, Safety Awareness     Visit Diagnosis: Hemiplegia and hemiparesis following nontraumatic intracerebral hemorrhage affecting right dominant side ([facility])    Problem List Patient Active Problem List   Diagnosis Date Noted  . Nontraumatic subcortical hemorrhage of left cerebral hemisphere ([facility]) 08/27/2019  . Acute blood loss anemia   . Hypoalbuminemia due to protein-calorie malnutrition ([facility])   . Thrombocytopenia ([facility])   . Dysphagia 07/29/2019  . Infarction of left basal ganglia ([facility]) 07/18/2019  . Hypernatremia   . Leukocytosis   . Essential hypertension   . Global aphasia   . Cytotoxic brain edema ([facility]) 07/10/2019  . ICH (intracerebral hemorrhage) ([facility]) 07/09/2019  . Anxiety state 02/21/2015  . Cephalalgia 12/21/2014    Carey Bullocks, OTR/L 10/12/2019, 12:19 PM  [facility] [address] [place], [place], [address] Phone: [phone]   Fax:  [phone]  Name: Melanie Madden MRN: [id number] Date of Birth: [birth year]

## 2019-10-13 ENCOUNTER — Telehealth: Payer: Self-pay

## 2019-10-13 NOTE — Telephone Encounter (Signed)
SW received phone call from pt husband with concerns related to a bill he received from [employer] (DME company for [employer]) and how he did not want the DME she was provided at discharge. SW reviewed pt chart and instructed him to call [facility] to follow-up about returning equipment. SW also encouraged him to refer to discharge instructions with the same information.   *This SW did not work on case with patient during stay at hospital.

## 2019-10-14 ENCOUNTER — Ambulatory Visit: Payer: Managed Care, Other (non HMO)

## 2019-10-14 ENCOUNTER — Other Ambulatory Visit: Payer: Self-pay

## 2019-10-14 ENCOUNTER — Ambulatory Visit: Payer: Managed Care, Other (non HMO) | Admitting: Occupational Therapy

## 2019-10-14 ENCOUNTER — Encounter: Payer: Self-pay | Admitting: Occupational Therapy

## 2019-10-14 DIAGNOSIS — R41841 Cognitive communication deficit: Secondary | ICD-10-CM

## 2019-10-14 DIAGNOSIS — I69151 Hemiplegia and hemiparesis following nontraumatic intracerebral hemorrhage affecting right dominant side: Secondary | ICD-10-CM

## 2019-10-14 DIAGNOSIS — R1310 Dysphagia, unspecified: Secondary | ICD-10-CM

## 2019-10-14 DIAGNOSIS — M6281 Muscle weakness (generalized): Secondary | ICD-10-CM

## 2019-10-14 DIAGNOSIS — R208 Other disturbances of skin sensation: Secondary | ICD-10-CM

## 2019-10-14 DIAGNOSIS — R482 Apraxia: Secondary | ICD-10-CM

## 2019-10-14 DIAGNOSIS — R2689 Other abnormalities of gait and mobility: Secondary | ICD-10-CM

## 2019-10-14 DIAGNOSIS — I69153 Hemiplegia and hemiparesis following nontraumatic intracerebral hemorrhage affecting right non-dominant side: Secondary | ICD-10-CM

## 2019-10-14 DIAGNOSIS — R4701 Aphasia: Secondary | ICD-10-CM

## 2019-10-14 DIAGNOSIS — I69219 Unspecified symptoms and signs involving cognitive functions following other nontraumatic intracranial hemorrhage: Secondary | ICD-10-CM

## 2019-10-14 NOTE — Therapy (Signed)
[facility] [address] [place], [place], [address] Phone: [phone]   Fax:  [phone]  Speech Language Pathology Treatment  Patient Details  Name: Melanie Madden MRN: [id number] Date of Birth: 1966-03-06 Referring Provider (SLP): Alysia Penna, MD   Encounter Date: 10/14/2019  End of Session - 10/14/19 1404    Visit Number  14    Number of Visits  25    Date for SLP Re-Evaluation  12/04/19    Authorization Type  cigna    Authorization Time Period  86 DAYS of therapy regardless of number disciplines per day    SLP Start Time  0935    SLP Stop Time   1015    SLP Time Calculation (min)  40 min    Activity Tolerance  Patient tolerated treatment well       Past Medical History:  Diagnosis Date  . Allergy   . Anxiety   . Arthritis   . High triglycerides   . Kidney stones   . Migraines   . Recurrent sinus infections     Past Surgical History:  Procedure Laterality Date  . ANTERIOR CRUCIATE LIGAMENT REPAIR Left   . BUNIONECTOMY Right   . LASIK    . LITHOTRIPSY    . MENISCUS REPAIR Left   . NASAL SEPTUM SURGERY    . SHOULDER SURGERY    . VARICOSE VEIN SURGERY Left     There were no vitals filed for this visit.  Subjective Assessment - 10/14/19 0943    Subjective  SLP reminded pt of her pain in rt arm she reported last time - pt shook head "no".    Currently in Pain?  No/denies            ADULT SLP TREATMENT - 10/14/19 1126      General Information   Behavior/Cognition  Alert;Cooperative;Agitated;Requires cueing      Treatment Provided   Treatment provided  Cognitive-Linquistic      Cognitive-Linquistic Treatment   Treatment focused on  Aphasia;Apraxia;Cognition;Patient/family/caregiver education    Skilled Treatment  Pt moaning intermittently during first 10 minutes of session today. SLP stressed to pt/husband with key words on white board pt's deficit in auditory  comprehension/language comprehension, given pt's response re "S" statement today. SLP targeted pt's auditory comprhension today at sentence level/multi-unit sentence level (using "before" "after") in picture ID f:4. Pt 64% success, incr'd to 70% with self correction (due to impulsivity).  SLP highlighted to pt/husband pt's impulsivity and pt needed to look at all 4 pictures prior to answering; after 3rd request by SLP pt began to pause prior to answering however success did not increase, notably. SLP encourged pt to work with Talk Path Therapy. Husband stated pt had very difficult time with making speech sounds using TalkPath. SLP reiterated sx of verbal apraxia to pt/husband.       Assessment / Recommendations / Plan   Plan  Continue with current plan of care      Progression Toward Goals   Progression toward goals  Progressing toward goals   very dependent on pt willingness to engage ST here and home      SLP Education - 10/14/19 1145    Education Details  pt is impulsive - needs to slow down response time, reitereated definition of verbal apraxia    Person(s) Educated  Patient;Spouse    Methods  Explanation;Verbal cues    Comprehension  Verbalized understanding;Need further instruction       SLP  Short Term Goals - 10/07/19 1239      SLP SHORT TERM GOAL #1   Title  pt will simultaneously with SLP produce 3 different phonemes 50% of the time over three sessions    Status  Partially Met      SLP SHORT TERM GOAL #2   Title  pt will ID objects salient to pt in f:4 80% of the time over 3 sessions    Status  Deferred      SLP [place] #3   Title  pt will write her full name on first attempt over three sessions    Time  1    Status  Deferred      SLP SHORT TERM GOAL #4   Title  pt will attempt expressive communication via multimodal means during 5 sessions    Status  Not Met      SLP SHORT TERM GOAL #5   Title  pt will demonstrate undertanding of simple 1-step functional  commands with 70% accuracy with multimodal cues over 3 sessions    Status  Not Met      SLP SHORT TERM GOAL #6   Title  pt will look at object or line drawing and match the correct word from f:6 words 90% success over 3 sessions    Status  Deferred       SLP Long Term Goals - 10/14/19 1408      SLP [place] #1   Title  pt's diet will be appropriately upgraded as needed by SLP    Time  6    Period  Weeks   or 25 total visits, for all LTGs   Status  On-going      SLP LONG TERM GOAL #2   Title  pt will produce 3 simple words/words pertinent to pt (family or pet names, etc) simultaneously with SLP and multimodal cues over three sessions    Time  6    Period  Weeks    Status  Revised      SLP LONG TERM GOAL #3   Title  pt will demo understanding of simple yes/no questions pertinent to desires/wants with multimodal cues 90% of the time over 3 sessions    Time  6    Period  Weeks    Status  On-going      SLP LONG TERM GOAL #4   Title  a device trial with a speech generating device will be initiated within the first 20 ST visits    Time  6    Period  Weeks    Status  On-going      SLP LONG TERM GOAL #5   Title  pt will demo recognition of written word for a common object 100% success over 3 sessions    Time  6    Period  Weeks    Status  On-going      SLP LONG TERM GOAL #6   Title  pt will work on speech-related tasks at home between 75% of sessions as reported by family and/or noted on [place] website    Time  6    Period  Weeks    Status  On-going       Plan - 10/14/19 1405    Clinical Impression Statement  Cont with no complaints with dys III diet/thin, to SLP knowledge. Husband shared that pt worked at home on /m/ and /h/ since last session. SLP told pt at least  30 minutes at least twice a day is required for work at home. PT WILL NEED TO CONTINUE TO COMPLY with work at home for [place] to continue. SLP addressed/brought up pt's impulsivity today and with  consistent cues initially it appeared pt began to look at all responses prior to choosing one (however her accuracy did not improve). SLP suggested to pt husband that he may want to familiarize himself with [employer] and other speech generating devices/apps. Pt cont to demo cognitive communication deficits in at least the areas of attention (incr'd impulsivity) and awareness (unaware of verbal deficits -vs/in addition to receptive languge deficit). Pt cont to present with a severe/profound expressive aphasia with verbal apraxia, and severe/profound receptive aphasia ("global aphasia"). See "skilled treatment" for more details of today's session. Pt did not complete entire WAB due to her incr'd frustration with expressive communication. At some point pt may benefit from a speech generating device. She would cont to benefit from skilled ST focusing on incr'ing communicative effectiveness via multimodal means and incr'ing functional communication (verbal or nonverbal), incr'ing cognition in functional ways as able given pt's language deficit, and monitoring/upgrading pt's diet as appropriate.Pt knows she will need to participate at home for therapy here to be of any assistance.    Speech Therapy Frequency  2x / week    Duration  --   8 weeks   Treatment/Interventions  Diet toleration management by SLP;Trials of upgraded texture/liquids;Language facilitation;Internal/external aids;Cognitive reorganization;Cueing hierarchy;SLP instruction and feedback;Patient/family education;Compensatory strategies;Multimodal communcation approach;Functional tasks;Environmental controls    Potential to Achieve Goals  Fair    Potential Considerations  Severity of impairments;Cooperation/participation level       Patient will benefit from skilled therapeutic intervention in order to improve the following deficits and impairments:   Aphasia  Verbal apraxia  Cognitive communication deficit  Dysphagia, unspecified  type    Problem List Patient Active Problem List   Diagnosis Date Noted  . Nontraumatic subcortical hemorrhage of left cerebral hemisphere ([place]) 08/27/2019  . Acute blood loss anemia   . Hypoalbuminemia due to protein-calorie malnutrition ([place])   . Thrombocytopenia ([place])   . Dysphagia 07/29/2019  . Infarction of left basal ganglia ([place]) 07/18/2019  . Hypernatremia   . Leukocytosis   . Essential hypertension   . Global aphasia   . Cytotoxic brain edema ([place]) 07/10/2019  . ICH (intracerebral hemorrhage) ([place]) 07/09/2019  . Anxiety state 02/21/2015  . Cephalalgia 12/21/2014    [place] ,[place], [place]  10/14/2019, 2:11 PM  [facility] [address] [place], [place], [address] Phone: [phone]   Fax:  [phone]   Name: Melanie Madden MRN: [id number] Date of Birth: [birth year]

## 2019-10-14 NOTE — Therapy (Signed)
[facility] [address] [place], [place], [address] Phone: [phone]   Fax:  [phone]  Physical Therapy Treatment  Patient Details  Name: Melanie Madden MRN: [id number] Date of Birth: [birth year] Referring Provider (PT): Lauraine Rinne, [place]   Encounter Date: 10/14/2019  PT End of Session - 10/14/19 1105    Visit Number  13    Number of Visits  20    Date for PT Re-Evaluation  11/24/19    Authorization Type  [employer]- 60 VL combined PT/OT/ST - counts as 1 visit if seen on same day.  Not sure if 60 is hard max.  $50 copay each day - make sure PT/OT/ST on same day if possible    Authorization - Visit Number  15   PT/OT/ST days combined   Authorization - Number of Visits  20    PT Start Time  1103    PT Stop Time  1144    PT Time Calculation (min)  41 min    Equipment Utilized During Treatment  Gait belt   pt's R AFO; added forefront wedge   Activity Tolerance  Patient tolerated treatment well    Behavior During Therapy  Impulsive       Past Medical History:  Diagnosis Date  . Allergy   . Anxiety   . Arthritis   . High triglycerides   . Kidney stones   . Migraines   . Recurrent sinus infections     Past Surgical History:  Procedure Laterality Date  . ANTERIOR CRUCIATE LIGAMENT REPAIR Left   . BUNIONECTOMY Right   . LASIK    . LITHOTRIPSY    . MENISCUS REPAIR Left   . NASAL SEPTUM SURGERY    . SHOULDER SURGERY    . VARICOSE VEIN SURGERY Left     There were no vitals filed for this visit.  Subjective Assessment - 10/14/19 1104    Subjective  Pt denies any new issues. Husband reports she has been showing more movement in right leg.    Patient is accompained by:  Family member    Pertinent History  anxiety, migraines, HTN    Patient Stated Goals  Per husband report they would like her to get stronger on right, improve mobility, and work on speech.    Currently in Pain?  No/denies                        [facility] Adult PT Treatment/Exercise - 10/14/19 1105      Transfers   Transfers  Sit to Stand;Stand to Sit    Sit to Stand  4: Min guard;4: Min assist    Sit to Stand Details  Verbal cues for technique;Manual facilitation for weight shifting    Sit to Stand Details (indicate cue type and reason)  Pt was cued to lean forward and try to shift weight more to right. PT blocking right knee for safety.    Stand to Sit  4: Min guard;4: Min assist    Stand to Sit Details (indicate cue type and reason)  Verbal cues for technique    Stand to Sit Details  Verbal cues to lean forward and stick but out when going to sit and controlq    Comments   Sit to stand 5 x 2 with 2" step under left foot to facilitate more right weight shift min assist with verbal cueing for form. Pt got balance each time but did need min assist  to prevent buckling at right knee and cuing to tighten gluts for more erect posture.      Ambulation/Gait   Ambulation/Gait  Yes    Ambulation/Gait Assistance  3: Mod assist    Ambulation/Gait Assistance Details  Rehab tech stabilizing at trunk CGA most of time with occasional min assist. PT assisting at right foot to advance min/mod assist with verbal cueing to slow down and try to advance left foot past right foot to get more weight shift right. Had danceskin on front of right shoe.    Ambulation Distance (Feet)  230 Feet   50' x 1 out of clinic   Assistive device  Rolling walker   right hand grip attachment, right AFO   Gait Pattern  Step-to pattern;Step-through pattern;Decreased stance time - right;Decreased step length - left;Decreased weight shift to right;Poor foot clearance - right    Ambulation Surface  Level;Indoor    Pre-Gait Activities  Standing holding to PT arm at times with PT blocking right knee weight shifting side to side x 10 with verbal and tactile cues to tighten gluts and try to straighten right knee with weight over it. Standing at  walker with right hand grip attachment tapping 2" step with left foot 10 x 2 with PT stabilizing right knee min/mod assist. Right foot forward back x 10 mod assist initiating movement mostly with trunk flex/ext.             PT Education - 10/14/19 1441    Education Details  Pt to continue with current HEP    Person(s) Educated  Patient;Spouse    Methods  Explanation    Comprehension  Verbalized understanding       PT Short Term Goals - 10/05/19 1743      PT SHORT TERM GOAL #1   Title  Pt will be able to perform initial HEP with assist of husband for improved function.    Baseline  Husband reports they have been working on exercises.    Time  4    Period  Weeks    Status  Achieved    Target Date  09/25/19      PT SHORT TERM GOAL #2   Title  Pt will transfer w/c to/from mat with slideboard CGA with assist to set up slideboard for improved mobility.    Baseline  performing squat pivots and stand pivots    Time  4    Period  Weeks    Status  Achieved    Target Date  09/25/19      PT SHORT TERM GOAL #3   Title  Pt will be able to stand x 2 min mod assist with PT blocking right knee for improved standing balance.    Time  4    Period  Weeks    Status  Achieved    Target Date  09/25/19      PT SHORT TERM GOAL #4   Title  Pt will perform stand/pivot transfer mod assist of husband to allow improved safety with transfers at home.    Time  4    Period  Weeks    Status  Achieved    Target Date  09/25/19      PT SHORT TERM GOAL #5   Title  Pt will perform all bed mobility min assist or less for improved mobility.    Time  4    Period  Weeks    Status  Partially Met    Target Date  09/25/19      PT SHORT TERM GOAL #6   Title  Pt will propel w/c  100' supervision for improved mobility in home.    Time  4    Period  Weeks    Status  Achieved    Target Date  09/25/19        PT Long Term Goals - 08/26/19 0841      PT LONG TERM GOAL #1   Title  Pt will be able to  perform progressive HEP for strengthening, balance and mobility with assist of husband to continued gains at home.    Time  10    Period  Weeks    Status  New    Target Date  11/04/19      PT LONG TERM GOAL #2   Title  Pt will be able to perform all transfers min assist for improved safety with mobility.    Time  10    Period  Weeks    Status  New    Target Date  11/04/19      PT LONG TERM GOAL #3   Title  Pt will be able to ambulate 10' with max assist for improved access to bathroom at home with husband with appropriate AD.    Time  10    Period  Weeks    Status  New    Target Date  11/04/19            Plan - 10/14/19 1441    Clinical Impression Statement  PT continues to focus on functional strengthening with transfers, standing and gait. Pt continues to be impulsive at times but is showing improvement with trying to slow down after cuing.    Personal Factors and Comorbidities  Comorbidity 3+    Comorbidities  anxiety, migraines, HTN    Examination-Activity Limitations  Bathing;Bed Mobility;Stand;Locomotion Level;Squat;Stairs;Dressing;Transfers    Examination-Participation Restrictions  Community Activity;Driving;Shop;Laundry;Meal Prep    Stability/Clinical Decision Making  Evolving/Moderate complexity    Rehab Potential  Fair    PT Frequency  2x / week    PT Duration  Other (comment)   10 weeks   PT Treatment/Interventions  ADLs/Self Care Home Management;Electrical Stimulation;DME Instruction;Gait training;Stair training;Functional mobility training;Therapeutic activities;Therapeutic exercise;Orthotic Fit/Training;Patient/family education;Neuromuscular re-education;Balance training;Manual techniques;Passive range of motion;Vestibular;Wheelchair mobility training    PT Next Visit Plan  Assess vitals prior to therapy. Give picture for bridging over physioball, right hip flex/ext on physioball and supine assisted abd/add .  Danceskin to front of right shoe to help slide.  Seated weight shifting to L, sit > stand on Steady reaching up and to the L.  Mirror therapy - although may be confusing or upsetting due to visual impairment in R lower quadrant?  Use of powderboard, supine exercises (possibly add these to HEP); sit<>stand with block under L foot to shift weight to R, Increasing R stance time for increased swing with LLE    Consulted and Agree with Plan of Care  Patient;Family member/caregiver    Family Member Consulted  husband       Patient will benefit from skilled therapeutic intervention in order to improve the following deficits and impairments:  Abnormal gait, Decreased activity tolerance, Decreased balance, Decreased cognition, Decreased knowledge of use of DME, Decreased mobility, Decreased range of motion, Decreased safety awareness, Decreased strength, Impaired sensation  Visit Diagnosis: Muscle weakness (generalized)  Other abnormalities of gait and mobility     Problem List Patient Active Problem List   Diagnosis Date Noted  .  Nontraumatic subcortical hemorrhage of left cerebral hemisphere ([place]) 08/27/2019  . Acute blood loss anemia   . Hypoalbuminemia due to protein-calorie malnutrition ([place])   . Thrombocytopenia ([place])   . Dysphagia 07/29/2019  . Infarction of left basal ganglia ([place]) 07/18/2019  . Hypernatremia   . Leukocytosis   . Essential hypertension   . Global aphasia   . Cytotoxic brain edema ([place]) 07/10/2019  . ICH (intracerebral hemorrhage) ([place]) 07/09/2019  . Anxiety state 02/21/2015  . Cephalalgia 12/21/2014    Electa Sniff, PT, DPT, NCS 10/14/2019, 2:44 PM  [facility] [address] [place], [place], [address] Phone: [phone]   Fax:  [phone]  Name: Melanie Madden MRN: [id number] Date of Birth: 1965-09-04

## 2019-10-14 NOTE — Therapy (Signed)
[facility] [address] [place], [place], [address] Phone: [phone]   Fax:  [phone]  Occupational Therapy Treatment  Patient Details  Name: Melanie Madden MRN: [id number] Date of Birth: 01-30-1966 Referring Provider (OT): Dr. Alysia Penna   Encounter Date: 10/14/2019  OT End of Session - 10/14/19 1019    Visit Number  12    Number of Visits  25    Date for OT Re-Evaluation  12/01/19    Authorization Type  [employer] 2021:  No prior auth OT, 60 DAY visit limit (regardless of how many therapies each day).    Authorization - Visit Number  15    Authorization - Number of Visits  [id number]    OT Start Time  1020    OT Stop Time  1100    OT Time Calculation (min)  40 min    Activity Tolerance  Patient tolerated treatment well    Behavior During Therapy  Impulsive       Past Medical History:  Diagnosis Date  . Allergy   . Anxiety   . Arthritis   . High triglycerides   . Kidney stones   . Migraines   . Recurrent sinus infections     Past Surgical History:  Procedure Laterality Date  . ANTERIOR CRUCIATE LIGAMENT REPAIR Left   . BUNIONECTOMY Right   . LASIK    . LITHOTRIPSY    . MENISCUS REPAIR Left   . NASAL SEPTUM SURGERY    . SHOULDER SURGERY    . VARICOSE VEIN SURGERY Left     There were no vitals filed for this visit.  Subjective Assessment - 10/14/19 1018    Subjective   pt denies pain.  She gestures that she can feel L arm more    Patient is accompanied by:  Family member    Pertinent History  Subcortical hemorrhage of R cerbral hemisphere.  PMH:  HTN, anxiety, migraines, hx of shoulder injection approx 2 weeks ago for R shoulder pain, hx of R shoulder surgery (arthroscopic)    Limitations  aphasia, fall risk, impulsive (husband reports hx of OCD)    Special Tests  PER HUSBAND for sit>stand:  pt responds to "big push" with knee blocked on both side    Patient Stated Goals  husband reports: to be able  to care for herself more (pt indicates that she onces to return to previous activities per questioning/gestures)    Currently in Pain?  No/denies        Neuro re-ed:  Transfer w/c>mat with min-mod A (therapist blocking knee), however, initial cueing needed for impulsivity.  In sitting,  light wt bearing with body on arm movements (LUE reach to targets) to incr activation RUE/trunk with mod assist/facilitation (visual, verbal, tactile cues given for normal movement patterns).   In sitting, bilateral low-range functional reach for RUE stabilization/AAROM from in small range with max facilitation and cueing for normal movement patterns.    Attempted grasp/release of small cylinder object with RUE assisted reach (by LUE holding at forearm) and mod facilitation for holding and max facilitation for release.    Light wt. Bearing in forward flex (low range) with BUEs, then with RUE with inconsistent activation and max facilitation (initiation from trunk) for incr RUE stabilization and trunk activation.   Attempted elbow flex/ext with foam noodle with max A and decr attention to activity; therefore discontinued.      OT Short Term Goals - 10/12/19 1216  OT SHORT TERM GOAL #1   Title  Pt/husband will be independent with initial HEP for RUE ROM--check STGs 10/14/19    Time  6    Period  Weeks    Status  Achieved      OT SHORT TERM GOAL #2   Title  Pt will perform UB dressing with min A.    Time  6    Period  Weeks    Status  On-going   MOD ASSIST     OT SHORT TERM GOAL #3   Title  Pt will perform UB bathing with mod A.    Time  6    Period  Weeks    Status  Achieved      OT SHORT TERM GOAL #4   Title  Pt will be able to brush hair with set-up.    Time  6    Period  Weeks    Status  On-going   fluctuating assist needed     OT SHORT TERM GOAL #5   Title  Pt will demo at least 110* shoulder flex PROM in supine without pain for incr ease for ADLs.    Time  6    Period  Weeks     Status  Achieved        OT Long Term Goals - 09/02/19 1229      OT LONG TERM GOAL #1   Title  Pt/husband will be independent with HEP for functional activities for incr participation in IADLs/leisure tasks and for cognition (and incorporating RUE if able).--check LTGs 12/01/19    Time  12    Period  Weeks    Status  New      OT LONG TERM GOAL #2   Title  Pt will perform UB dressing with supervision.    Time  12    Period  Weeks    Status  New      OT LONG TERM GOAL #3   Title  Pt will perform LB dressing with mod A.    Time  12    Period  Weeks    Status  New      OT LONG TERM GOAL #4   Title  Pt will perform UB bathing with min A.    Time  12    Period  Weeks    Status  New      OT LONG TERM GOAL #5   Title  Pt will perform simple cold snack prep from w/c level with min A/set up.    Time  12    Period  Weeks    Status  New      OT LONG TERM GOAL #6   Title  Pt will perform toileting with mod A.    Time  12    Period  Weeks    Status  New            Plan - 10/14/19 1107    Clinical Impression Statement  Pt is progessing slowly due to severity of deficits.  However, pt demo decr impulsivity, improved understanding of tactile/verbal/demo cues, improved frustration tolerance, and incr stabilization of RUE, but is inconsistent.    Occupational performance deficits (Please refer to evaluation for details):  ADL's;IADL's;Leisure;Work;Social Participation    Body Structure / Function / Physical Skills  ADL;ROM;IADL;Sensation;Mobility;Strength;Tone;UE functional use;Decreased knowledge of precautions;Decreased knowledge of use of DME;Coordination;Balance;FMC;GMC    Cognitive Skills  Attention;Temperament/Personality;Understand;Thought;Safety Awareness    Rehab Potential  Good  OT Frequency  2x / week    OT Duration  12 weeks    OT Treatment/Interventions  Self-care/ADL training;Moist Heat;DME and/or AE instruction;Splinting;Therapeutic activities;Aquatic  Therapy;Cognitive remediation/compensation;Therapeutic exercise;Cryotherapy;Neuromuscular education;Functional Mobility Training;Passive range of motion;Visual/perceptual remediation/compensation;Patient/family education;Manual Therapy;Electrical Stimulation;Fluidtherapy    Plan  continue to address ADLs, neuro re-ed, ?NMES to triceps    Consulted and Agree with Plan of Care  Patient;Family member/caregiver    Family Member Consulted  husband       Patient will benefit from skilled therapeutic intervention in order to improve the following deficits and impairments:   Body Structure / Function / Physical Skills: ADL, ROM, IADL, Sensation, Mobility, Strength, Tone, UE functional use, Decreased knowledge of precautions, Decreased knowledge of use of DME, Coordination, Balance, FMC, GMC Cognitive Skills: Attention, Temperament/Personality, Understand, Thought, Safety Awareness     Visit Diagnosis: Hemiplegia and hemiparesis following nontraumatic intracerebral hemorrhage affecting right dominant side (HCC)  Muscle weakness (generalized)  Other abnormalities of gait and mobility  Other disturbances of skin sensation  Unspecified symptoms and signs involving cognitive functions following other nontraumatic intracranial hemorrhage  Hemiplegia and hemiparesis following nontraumatic intracerebral hemorrhage affecting right non-dominant side ([place])    Problem List Patient Active Problem List   Diagnosis Date Noted  . Nontraumatic subcortical hemorrhage of left cerebral hemisphere ([place]) 08/27/2019  . Acute blood loss anemia   . Hypoalbuminemia due to protein-calorie malnutrition ([place])   . Thrombocytopenia ([place])   . Dysphagia 07/29/2019  . Infarction of left basal ganglia ([place]) 07/18/2019  . Hypernatremia   . Leukocytosis   . Essential hypertension   . Global aphasia   . Cytotoxic brain edema ([place]) 07/10/2019  . ICH (intracerebral hemorrhage) ([place]) 07/09/2019  . Anxiety state 02/21/2015   . Cephalalgia 12/21/2014    [facility] 10/14/2019, 11:15 AM  [facility] [address] [place], [place], [address] Phone: [phone]   Fax:  [phone]  Name: Melanie Madden MRN: [id number] Date of Birth: 05-May-1966   Vianne Bulls, OTR/L [facility] [address] [place], [place]  [address] [phone] phone [phone] 10/14/19 11:27 AM

## 2019-10-19 ENCOUNTER — Ambulatory Visit: Payer: Managed Care, Other (non HMO)

## 2019-10-19 ENCOUNTER — Ambulatory Visit: Payer: Managed Care, Other (non HMO) | Admitting: Occupational Therapy

## 2019-10-19 ENCOUNTER — Other Ambulatory Visit: Payer: Self-pay

## 2019-10-19 DIAGNOSIS — R2689 Other abnormalities of gait and mobility: Secondary | ICD-10-CM

## 2019-10-19 DIAGNOSIS — M6281 Muscle weakness (generalized): Secondary | ICD-10-CM

## 2019-10-19 DIAGNOSIS — I69151 Hemiplegia and hemiparesis following nontraumatic intracerebral hemorrhage affecting right dominant side: Secondary | ICD-10-CM

## 2019-10-19 NOTE — Therapy (Signed)
[facility] [address] [place], [place], [address] Phone: [phone]   Fax:  [phone]  Occupational Therapy Treatment  Patient Details  Name: Melanie Madden MRN: [id number] Date of Birth: 1965/11/25 Referring Provider (OT): Dr. Alysia Penna   Encounter Date: 10/19/2019  OT End of Session - 10/19/19 1245    Visit Number  13    Number of Visits  25    Date for OT Re-Evaluation  12/01/19    Authorization Type  [employer] 2021:  No prior auth OT, 60 DAY visit limit (regardless of how many therapies each day).    Authorization - Visit Number  16    Authorization - Number of Visits  74    OT Start Time  1020    OT Stop Time  1105    OT Time Calculation (min)  45 min    Activity Tolerance  Patient tolerated treatment well    Behavior During Therapy  Impulsive       Past Medical History:  Diagnosis Date  . Allergy   . Anxiety   . Arthritis   . High triglycerides   . Kidney stones   . Migraines   . Recurrent sinus infections     Past Surgical History:  Procedure Laterality Date  . ANTERIOR CRUCIATE LIGAMENT REPAIR Left   . BUNIONECTOMY Right   . LASIK    . LITHOTRIPSY    . MENISCUS REPAIR Left   . NASAL SEPTUM SURGERY    . SHOULDER SURGERY    . VARICOSE VEIN SURGERY Left     There were no vitals filed for this visit.  Subjective Assessment - 10/19/19 1024    Patient is accompanied by:  Family member    Pertinent History  Subcortical hemorrhage of R cerbral hemisphere.  PMH:  HTN, anxiety, migraines, hx of shoulder injection approx 2 weeks ago for R shoulder pain, hx of R shoulder surgery (arthroscopic)    Limitations  aphasia, fall risk, impulsive (husband reports hx of OCD)    Special Tests  PER HUSBAND for sit>stand:  pt responds to "big push" with knee blocked on both side    Patient Stated Goals  husband reports: to be able to care for herself more (pt indicates that she onces to return to previous  activities per questioning/gestures)    Currently in Pain?  No/denies        Pt practicing LB dressing doffing Rt shoe/brace w/ only min assist once and doffing Rt sock I'ly. Pt then donned Rt sock w/ initial cues and use of stool, but required max assist to don shoe/brace.  Light weight bearing over RUE while cross reaching LUE for targeted item (pt inconsistent with RUE activation during wt bearing). Low range AA/ROM closed chain RUE on tilted stool - pt able to activate RUE during this in small ranges w/o assist! Pt also able to sustain attention x approx 5 min to RUE x 2 during this activity.  NMES x 10 min to wrist/finger extensors at 50 pps, 250 pw, 10 sec on/off cycle.                       OT Short Term Goals - 10/12/19 1216      OT SHORT TERM GOAL #1   Title  Pt/husband will be independent with initial HEP for RUE ROM--check STGs 10/14/19    Time  6    Period  Weeks    Status  Achieved      OT SHORT TERM GOAL #2   Title  Pt will perform UB dressing with min A.    Time  6    Period  Weeks    Status  On-going   MOD ASSIST     OT SHORT TERM GOAL #3   Title  Pt will perform UB bathing with mod A.    Time  6    Period  Weeks    Status  Achieved      OT SHORT TERM GOAL #4   Title  Pt will be able to brush hair with set-up.    Time  6    Period  Weeks    Status  On-going   fluctuating assist needed     OT SHORT TERM GOAL #5   Title  Pt will demo at least 110* shoulder flex PROM in supine without pain for incr ease for ADLs.    Time  6    Period  Weeks    Status  Achieved        OT Long Term Goals - 09/02/19 1229      OT LONG TERM GOAL #1   Title  Pt/husband will be independent with HEP for functional activities for incr participation in IADLs/leisure tasks and for cognition (and incorporating RUE if able).--check LTGs 12/01/19    Time  12    Period  Weeks    Status  New      OT LONG TERM GOAL #2   Title  Pt will perform UB dressing with  supervision.    Time  12    Period  Weeks    Status  New      OT LONG TERM GOAL #3   Title  Pt will perform LB dressing with mod A.    Time  12    Period  Weeks    Status  New      OT LONG TERM GOAL #4   Title  Pt will perform UB bathing with min A.    Time  12    Period  Weeks    Status  New      OT LONG TERM GOAL #5   Title  Pt will perform simple cold snack prep from w/c level with min A/set up.    Time  12    Period  Weeks    Status  New      OT LONG TERM GOAL #6   Title  Pt will perform toileting with mod A.    Time  12    Period  Weeks    Status  New            Plan - 10/19/19 1246    Clinical Impression Statement  Pt is progessing slowly due to severity of deficits.  However, pt demo decr impulsivity, improved understanding of tactile/verbal/demo cues, improved frustration tolerance, and incr stabilization of RUE, but is inconsistent.    Occupational performance deficits (Please refer to evaluation for details):  ADL's;IADL's;Leisure;Work;Social Participation    Body Structure / Function / Physical Skills  ADL;ROM;IADL;Sensation;Mobility;Strength;Tone;UE functional use;Decreased knowledge of precautions;Decreased knowledge of use of DME;Coordination;Balance;FMC;GMC    Cognitive Skills  Attention;Temperament/Personality;Understand;Thought;Safety Awareness    Rehab Potential  Good    OT Frequency  2x / week    OT Duration  12 weeks    OT Treatment/Interventions  Self-care/ADL training;Moist Heat;DME and/or AE instruction;Splinting;Therapeutic activities;Aquatic Therapy;Cognitive remediation/compensation;Therapeutic exercise;Cryotherapy;Neuromuscular education;Functional Mobility Training;Passive range of motion;Visual/perceptual remediation/compensation;Patient/family  education;Manual Therapy;Electrical Stimulation;Fluidtherapy    Plan  continue to address ADLs, neuro re-ed including use of tilted stool, ?NMES to triceps    Consulted and Agree with Plan of Care   Patient;Family member/caregiver    Family Member Consulted  husband       Patient will benefit from skilled therapeutic intervention in order to improve the following deficits and impairments:   Body Structure / Function / Physical Skills: ADL, ROM, IADL, Sensation, Mobility, Strength, Tone, UE functional use, Decreased knowledge of precautions, Decreased knowledge of use of DME, Coordination, Balance, FMC, GMC Cognitive Skills: Attention, Temperament/Personality, Understand, Thought, Safety Awareness     Visit Diagnosis: Hemiplegia and hemiparesis following nontraumatic intracerebral hemorrhage affecting right dominant side (HCC)  Muscle weakness (generalized)    Problem List Patient Active Problem List   Diagnosis Date Noted  . Nontraumatic subcortical hemorrhage of left cerebral hemisphere ([place]) 08/27/2019  . Acute blood loss anemia   . Hypoalbuminemia due to protein-calorie malnutrition ([place])   . Thrombocytopenia ([place])   . Dysphagia 07/29/2019  . Infarction of left basal ganglia ([place]) 07/18/2019  . Hypernatremia   . Leukocytosis   . Essential hypertension   . Global aphasia   . Cytotoxic brain edema ([place]) 07/10/2019  . ICH (intracerebral hemorrhage) ([place]) 07/09/2019  . Anxiety state 02/21/2015  . Cephalalgia 12/21/2014    Carey Bullocks, OTR/L 10/19/2019, 12:48 PM  [facility] [address] [place], [place], [address] Phone: [phone]   Fax:  [phone]  Name: Melanie Madden MRN: [id number] Date of Birth: 08-Nov-1965

## 2019-10-19 NOTE — Therapy (Signed)
[facility] [address] [place], [place], [address] Phone: [phone]   Fax:  [phone]  Physical Therapy Treatment  Patient Details  Name: Melanie Madden MRN: [id number] Date of Birth: September 08, 1965 Referring Provider (PT): Lauraine Rinne, [place]   Encounter Date: 10/19/2019  PT End of Session - 10/19/19 1107    Visit Number  14    Number of Visits  20    Date for PT Re-Evaluation  11/24/19    Authorization Type  [employer]- 60 VL combined PT/OT/ST - counts as 1 visit if seen on same day.  Not sure if 60 is hard max.  $50 copay each day - make sure PT/OT/ST on same day if possible    Authorization - Visit Number  16   PT/OT/ST days combined   Authorization - Number of Visits  20    PT Start Time  1105    PT Stop Time  1145    PT Time Calculation (min)  40 min    Equipment Utilized During Treatment  Gait belt   pt's R AFO; added forefront wedge   Activity Tolerance  Patient tolerated treatment well    Behavior During Therapy  Impulsive       Past Medical History:  Diagnosis Date  . Allergy   . Anxiety   . Arthritis   . High triglycerides   . Kidney stones   . Migraines   . Recurrent sinus infections     Past Surgical History:  Procedure Laterality Date  . ANTERIOR CRUCIATE LIGAMENT REPAIR Left   . BUNIONECTOMY Right   . LASIK    . LITHOTRIPSY    . MENISCUS REPAIR Left   . NASAL SEPTUM SURGERY    . SHOULDER SURGERY    . VARICOSE VEIN SURGERY Left     There were no vitals filed for this visit.  Subjective Assessment - 10/19/19 1107    Subjective  Pt denies any new issues. Husband reports she has been showing more movement in right leg.    Patient is accompained by:  Family member    Pertinent History  anxiety, migraines, HTN    Patient Stated Goals  Per husband report they would like her to get stronger on right, improve mobility, and work on speech.                       [facility]  Treatment/Exercise - 10/19/19 1148      Transfers   Transfers  Sit to Stand;Stand to [employer]    Sit to Stand  4: Min guard    Sit to Stand Details  Verbal cues for technique;Visual cues/gestures for precautions/safety    Stand to Sit  4: Min guard    Stand to Sit Details (indicate cue type and reason)  Visual cues/gestures for precautions/safety    Stand to Sit Details  Pt needed reminders and assist to remove remove right hand from walker.     Stand Pivot Transfers  4: Min assist    Stand Pivot Transfer Details (indicate cue type and reason)  with walker w/c to mat      Ambulation/Gait   Ambulation/Gait  Yes    Ambulation/Gait Assistance  3: Mod assist    Ambulation/Gait Assistance Details  First bout 2nd therapist on trunk trying to help with weight shifting but patient tends to push more so focused on pelvic rotation with other PT at right foot to help with  advancement min assist. Last 15' had patient advance right leg herself with 2nd therapist helping more with walker control. Second bout 1 therapist mod/max assist at trunk trying to prevent strong left lean with pt advancing RLE with verbal cues to take her time. Pt was able to advance RLE but using trunk to help advance as well. Pt unable to shift more over right even with cues to increase left step length with decreased left step throughout. Second therapist assisted last 53' trying to get more upright posture with helping to maintain head in more upright position but pt still struggling to get over right leg.     Ambulation Distance (Feet)  230 Feet   115   Assistive device  Rolling walker   right hand grip attachment, right AFO and dance skin on shoe   Gait Pattern  Step-to pattern;Step-through pattern;Decreased hip/knee flexion - right;Decreased weight shift to right    Ambulation Surface  Level;Indoor    Gait Comments  Standing at walker with right hand grip attachment with pt bringing right leg forwards to touch  2" step in front of her and then sliding back min assist at times to bring back and using trunk to assist in advancement. Left LE tapping 2" step 10 x 2 to facilitate right weight shift min/mod assist.             PT Education - 10/19/19 1854    Education Details  Pt to continue with current HEP    Person(s) Educated  Patient;Spouse    Methods  Explanation    Comprehension  Verbalized understanding       PT Short Term Goals - 10/05/19 1743      PT SHORT TERM GOAL #1   Title  Pt will be able to perform initial HEP with assist of husband for improved function.    Baseline  Husband reports they have been working on exercises.    Time  4    Period  Weeks    Status  Achieved    Target Date  09/25/19      PT SHORT TERM GOAL #2   Title  Pt will transfer w/c to/from mat with slideboard CGA with assist to set up slideboard for improved mobility.    Baseline  performing squat pivots and stand pivots    Time  4    Period  Weeks    Status  Achieved    Target Date  09/25/19      PT SHORT TERM GOAL #3   Title  Pt will be able to stand x 2 min mod assist with PT blocking right knee for improved standing balance.    Time  4    Period  Weeks    Status  Achieved    Target Date  09/25/19      PT SHORT TERM GOAL #4   Title  Pt will perform stand/pivot transfer mod assist of husband to allow improved safety with transfers at home.    Time  4    Period  Weeks    Status  Achieved    Target Date  09/25/19      PT SHORT TERM GOAL #5   Title  Pt will perform all bed mobility min assist or less for improved mobility.    Time  4    Period  Weeks    Status  Partially Met    Target Date  09/25/19      PT SHORT TERM GOAL #6  Title  Pt will propel w/c  100' supervision for improved mobility in home.    Time  4    Period  Weeks    Status  Achieved    Target Date  09/25/19        PT Long Term Goals - 08/26/19 0841      PT LONG TERM GOAL #1   Title  Pt will be able to perform  progressive HEP for strengthening, balance and mobility with assist of husband to continued gains at home.    Time  10    Period  Weeks    Status  New    Target Date  11/04/19      PT LONG TERM GOAL #2   Title  Pt will be able to perform all transfers min assist for improved safety with mobility.    Time  10    Period  Weeks    Status  New    Target Date  11/04/19      PT LONG TERM GOAL #3   Title  Pt will be able to ambulate 10' with max assist for improved access to bathroom at home with husband with appropriate AD.    Time  10    Period  Weeks    Status  New    Target Date  11/04/19            Plan - 10/19/19 1855    Clinical Impression Statement  Pt continues to initiate more movement in RLE to advance leg. Pt is able to shift over RLE more with PT assisting at leg and with verbal cues to larger left step but when PT allows pt to fully try to advance and focuses in trunk pt has heavy left lean and unable to correct.    Personal Factors and Comorbidities  Comorbidity 3+    Comorbidities  anxiety, migraines, HTN    Examination-Activity Limitations  Bathing;Bed Mobility;Stand;Locomotion Level;Squat;Stairs;Dressing;Transfers    Examination-Participation Restrictions  Community Activity;Driving;Shop;Laundry;Meal Prep    Stability/Clinical Decision Making  Evolving/Moderate complexity    Rehab Potential  Fair    PT Frequency  2x / week    PT Duration  Other (comment)   10 weeks   PT Treatment/Interventions  ADLs/Self Care Home Management;Electrical Stimulation;DME Instruction;Gait training;Stair training;Functional mobility training;Therapeutic activities;Therapeutic exercise;Orthotic Fit/Training;Patient/family education;Neuromuscular re-education;Balance training;Manual techniques;Passive range of motion;Vestibular;Wheelchair mobility training    PT Next Visit Plan  Recert due by 4/7. Assess vitals prior to therapy. Give picture for bridging over physioball, right hip flex/ext  on physioball and supine assisted abd/add .  [place] to front of right shoe to help slide. Seated weight shifting to L, sit > stand on Steady reaching up and to the L.  Mirror therapy - although may be confusing or upsetting due to visual impairment in R lower quadrant?  Use of powderboard, supine exercises (possibly add these to HEP); sit<>stand with block under L foot to shift weight to R, Increasing R stance time for increased swing with LLE    Consulted and Agree with Plan of Care  Patient;Family member/caregiver    Family Member Consulted  husband       Patient will benefit from skilled therapeutic intervention in order to improve the following deficits and impairments:  Abnormal gait, Decreased activity tolerance, Decreased balance, Decreased cognition, Decreased knowledge of use of DME, Decreased mobility, Decreased range of motion, Decreased safety awareness, Decreased strength, Impaired sensation  Visit Diagnosis: Muscle weakness (generalized)  Other abnormalities of gait and  mobility     Problem List Patient Active Problem List   Diagnosis Date Noted  . Nontraumatic subcortical hemorrhage of left cerebral hemisphere ([facility]) 08/27/2019  . Acute blood loss anemia   . Hypoalbuminemia due to protein-calorie malnutrition ([facility])   . Thrombocytopenia ([place])   . Dysphagia 07/29/2019  . Infarction of left basal ganglia ([facility] [place]) 07/18/2019  . Hypernatremia   . Leukocytosis   . Essential hypertension   . Global aphasia   . Cytotoxic brain edema ([facility]) 07/10/2019  . ICH (intracerebral hemorrhage) ([place]) 07/09/2019  . Anxiety state 02/21/2015  . Cephalalgia 12/21/2014    Electa Sniff, PT, DPT, NCS 10/19/2019, 6:57 PM  [facility] [address] [place], [place], [address] Phone: [phone]   Fax:  [phone]  Name: Melanie Madden MRN: [id number] Date of Birth: [birth year]

## 2019-10-21 ENCOUNTER — Encounter: Payer: Self-pay | Admitting: Occupational Therapy

## 2019-10-21 ENCOUNTER — Telehealth: Payer: Self-pay

## 2019-10-21 ENCOUNTER — Ambulatory Visit: Payer: Managed Care, Other (non HMO)

## 2019-10-21 ENCOUNTER — Ambulatory Visit: Payer: Managed Care, Other (non HMO) | Admitting: Occupational Therapy

## 2019-10-21 ENCOUNTER — Other Ambulatory Visit: Payer: Self-pay

## 2019-10-21 DIAGNOSIS — I69219 Unspecified symptoms and signs involving cognitive functions following other nontraumatic intracranial hemorrhage: Secondary | ICD-10-CM

## 2019-10-21 DIAGNOSIS — M6281 Muscle weakness (generalized): Secondary | ICD-10-CM

## 2019-10-21 DIAGNOSIS — R482 Apraxia: Secondary | ICD-10-CM

## 2019-10-21 DIAGNOSIS — R1310 Dysphagia, unspecified: Secondary | ICD-10-CM

## 2019-10-21 DIAGNOSIS — R2689 Other abnormalities of gait and mobility: Secondary | ICD-10-CM

## 2019-10-21 DIAGNOSIS — R41841 Cognitive communication deficit: Secondary | ICD-10-CM

## 2019-10-21 DIAGNOSIS — R208 Other disturbances of skin sensation: Secondary | ICD-10-CM

## 2019-10-21 DIAGNOSIS — I69151 Hemiplegia and hemiparesis following nontraumatic intracerebral hemorrhage affecting right dominant side: Secondary | ICD-10-CM

## 2019-10-21 DIAGNOSIS — R4701 Aphasia: Secondary | ICD-10-CM

## 2019-10-21 NOTE — Therapy (Signed)
[facility] [address] [place], [place], [address] Phone: [phone]   Fax:  [phone]  Speech Language Pathology Treatment  Patient Details  Name: Melanie Madden MRN: [id number] Date of Birth: 1966/01/22 Referring Provider (SLP): Alysia Penna, MD   Encounter Date: 10/21/2019  End of Session - 10/21/19 1315    Visit Number  15    Number of Visits  25    Date for SLP Re-Evaluation  12/04/19    SLP Start Time  0935    SLP Stop Time   1015    SLP Time Calculation (min)  40 min    Activity Tolerance  Patient tolerated treatment well       Past Medical History:  Diagnosis Date  . Allergy   . Anxiety   . Arthritis   . High triglycerides   . Kidney stones   . Migraines   . Recurrent sinus infections     Past Surgical History:  Procedure Laterality Date  . ANTERIOR CRUCIATE LIGAMENT REPAIR Left   . BUNIONECTOMY Right   . LASIK    . LITHOTRIPSY    . MENISCUS REPAIR Left   . NASAL SEPTUM SURGERY    . SHOULDER SURGERY    . VARICOSE VEIN SURGERY Left     There were no vitals filed for this visit.  Subjective Assessment - 10/21/19 0942    Subjective  Pt initiallyl told SLP "yes" for pain and pointed to "7-8" on pain scale but then when asked where pain was (in 3 different ways using picture of body), pointed to pain scale again. SLP again asked if she had pain and pt shook head no.    Currently in Pain?  No/denies            ADULT SLP TREATMENT - 10/21/19 0945      General Information   Behavior/Cognition  Alert;Cooperative;Doesn't follow directions;Requires cueing;Impulsive      Treatment Provided   Treatment provided  Cognitive-Linquistic      Cognitive-Linquistic Treatment   Treatment focused on  Aphasia;Apraxia    Skilled Treatment  See "S" about difficulty with pt's simple comprehension re: pain. Decr'd cognition evidenced by pt still without knowledge of the routine of pain questions  after many times of the same routine. SLP reviewed pt signs with her - 10% success with SLP providing an auditory scenario of 3-4 words; SLP also used written and facial cues. SLP used written single word cues to explain practice at home necessary every day. Work on [employer] /m/ and /h/ spontaneously - /m/ 35%, /h/ 60% - pt perseverated with /h/ which inflated the success with /h/ this session. Pt worked with SLP on her signs with SLP using facial expressions, intonation, and verbal descriptions as stimuli for pt to show him the sign with 15% success, increased to 25% with repetition of stimuli. Some signs were rudimentary due to pt apraxia.       Assessment / Recommendations / Plan   Plan  Continue with current plan of care      Progression Toward Goals   Progression toward goals  Progressing toward goals       SLP Education - 10/21/19 1314    Education Details  must work on speech 30 minutes twice each day EVERY DAY    Person(s) Educated  Patient    Methods  Explanation;Verbal cues    Comprehension  Verbal cues required;Need further instruction   indicated understanding  SLP Short Term Goals - 10/07/19 1239      SLP SHORT TERM GOAL #1   Title  pt will simultaneously with SLP produce 3 different phonemes 50% of the time over three sessions    Status  Partially Met      SLP SHORT TERM GOAL #2   Title  pt will ID objects salient to pt in f:4 80% of the time over 3 sessions    Status  Deferred      SLP [place] #3   Title  pt will write her full name on first attempt over three sessions    Time  1    Status  Deferred      SLP SHORT TERM GOAL #4   Title  pt will attempt expressive communication via multimodal means during 5 sessions    Status  Not Met      SLP SHORT TERM GOAL #5   Title  pt will demonstrate undertanding of simple 1-step functional commands with 70% accuracy with multimodal cues over 3 sessions    Status  Not Met      SLP SHORT TERM GOAL #6   Title  pt  will look at object or line drawing and match the correct word from f:6 words 90% success over 3 sessions    Status  Deferred       SLP Long Term Goals - 10/21/19 1316      SLP LONG TERM GOAL #1   Title  pt's diet will be appropriately upgraded as needed by SLP    Time  5    Period  Weeks   or 25 total visits, for all LTGs   Status  On-going      SLP LONG TERM GOAL #2   Title  pt will produce 3 simple words/words pertinent to pt (family or pet names, etc) simultaneously with SLP and multimodal cues over three sessions    Time  5    Period  Weeks    Status  Revised      SLP LONG TERM GOAL #3   Title  pt will demo understanding of simple yes/no questions pertinent to desires/wants with multimodal cues 90% of the time over 3 sessions    Time  5    Period  Weeks    Status  On-going      SLP LONG TERM GOAL #4   Title  a device trial with a speech generating device will be initiated within the first 20 ST visits    Time  5    Period  Weeks    Status  On-going      SLP LONG TERM GOAL #5   Title  pt will demo recognition of written word for a common object 100% success over 3 sessions    Time  5    Period  Weeks    Status  On-going      SLP LONG TERM GOAL #6   Title  pt will work on speech-related tasks at home between 75% of sessions as reported by family and/or noted on [place] website    Time  5    Period  Weeks    Status  On-going       Plan - 10/21/19 1316    Clinical Impression Statement  Cont with no complaints with dys III diet/thin, to SLP knowledge. SLP again told pt at least 30 minutes at least twice a day is required for work at  home. PT WILL NEED TO CONTINUE TO COMPLY with work at home for [place] to continue. SLP addressed/brought up pt's impulsivity today and with consistent cues initially it appeared pt began to look at all responses prior to choosing one (however her accuracy did not improve). SLP suggested to pt husband that he may want to familiarize  himself with [employer] and other speech generating devices/apps. Pt cont to demo cognitive communication deficits in at least the areas of attention (incr'd impulsivity) and awareness (unaware of verbal deficits -vs/in addition to receptive languge deficit). Pt cont to present with a severe/profound expressive aphasia with verbal apraxia, and severe/profound receptive aphasia ("global aphasia"). See "skilled treatment" for more details of today's session. Pt did not complete entire WAB due to her incr'd frustration with expressive communication. At some point pt may benefit from a speech generating device. She would cont to benefit from skilled ST focusing on incr'ing communicative effectiveness via multimodal means and incr'ing functional communication (verbal or nonverbal), incr'ing cognition in functional ways as able given pt's language deficit, and monitoring/upgrading pt's diet as appropriate.Pt knows she will need to participate at home for therapy here to be of any assistance.    Speech Therapy Frequency  2x / week    Duration  --   8 weeks   Treatment/Interventions  Diet toleration management by SLP;Trials of upgraded texture/liquids;Language facilitation;Internal/external aids;Cognitive reorganization;Cueing hierarchy;SLP instruction and feedback;Patient/family education;Compensatory strategies;Multimodal communcation approach;Functional tasks;Environmental controls    Potential to Achieve Goals  Fair    Potential Considerations  Severity of impairments;Cooperation/participation level       Patient will benefit from skilled therapeutic intervention in order to improve the following deficits and impairments:   Aphasia  Verbal apraxia  Cognitive communication deficit  Dysphagia, unspecified type    Problem List Patient Active Problem List   Diagnosis Date Noted  . Nontraumatic subcortical hemorrhage of left cerebral hemisphere ([facility]) 08/27/2019  . Acute blood loss anemia   .  Hypoalbuminemia due to protein-calorie malnutrition ([facility])   . Thrombocytopenia ([facility])   . Dysphagia 07/29/2019  . Infarction of left basal ganglia ([facility]) 07/18/2019  . Hypernatremia   . Leukocytosis   . Essential hypertension   . Global aphasia   . Cytotoxic brain edema ([facility]) 07/10/2019  . ICH (intracerebral hemorrhage) ([facility]) 07/09/2019  . Anxiety state 02/21/2015  . Cephalalgia 12/21/2014    [place] ,[place], [place]  10/21/2019, 1:17 PM  [facility] [address] [place], [place], [address] Phone: [phone]   Fax:  [phone]   Name: Melanie Madden MRN: [id number] Date of Birth: 10-Aug-1965

## 2019-10-21 NOTE — Therapy (Signed)
[facility] [address] [place], [place], [address] Phone: [phone]   Fax:  [phone]  Physical Therapy Treatment  Patient Details  Name: Melanie Madden MRN: [id number] Date of Birth: 01-14-1966 Referring Provider (PT): Lauraine Rinne, [place]   Encounter Date: 10/21/2019  PT End of Session - 10/21/19 1107    Visit Number  15    Number of Visits  20    Date for PT Re-Evaluation  11/24/19    Authorization Type  [employer]- 60 VL combined PT/OT/ST - counts as 1 visit if seen on same day.  Not sure if 60 is hard max.  $50 copay each day - make sure PT/OT/ST on same day if possible    Authorization - Visit Number  17   PT/OT/ST days combined   Authorization - Number of Visits  20    PT Start Time  1105    PT Stop Time  1148    PT Time Calculation (min)  43 min    Equipment Utilized During Treatment  Gait belt   pt's R AFO; added forefront wedge   Activity Tolerance  Patient tolerated treatment well    Behavior During Therapy  Impulsive       Past Medical History:  Diagnosis Date  . Allergy   . Anxiety   . Arthritis   . High triglycerides   . Kidney stones   . Migraines   . Recurrent sinus infections     Past Surgical History:  Procedure Laterality Date  . ANTERIOR CRUCIATE LIGAMENT REPAIR Left   . BUNIONECTOMY Right   . LASIK    . LITHOTRIPSY    . MENISCUS REPAIR Left   . NASAL SEPTUM SURGERY    . SHOULDER SURGERY    . VARICOSE VEIN SURGERY Left     There were no vitals filed for this visit.  Subjective Assessment - 10/21/19 1106    Subjective  Pt reports she is doing well. Just finished with OT. Husband reports bathroom transfer going well.    Patient is accompained by:  Family member    Pertinent History  anxiety, migraines, HTN    Patient Stated Goals  Per husband report they would like her to get stronger on right, improve mobility, and work on speech.    Currently in Pain?  No/denies                        [facility] Adult PT Treatment/Exercise - 10/21/19 1107      Transfers   Transfers  Sit to Stand;Stand to [employer] Transfers    Sit to Stand  4: Min guard;4: Min assist    Sit to Stand Details  Verbal cues for technique;Verbal cues for precautions/safety    Stand to Sit  4: Min guard;4: Min assist    Stand to Sit Details (indicate cue type and reason)  Verbal cues for precautions/safety;Verbal cues for technique    Stand to Sit Details  Pt forgets hand on walker when going to sit    Stand Pivot Transfers  4: Min assist    Stand Pivot Transfer Details (indicate cue type and reason)  w/c to mat      Ambulation/Gait   Ambulation/Gait  Yes    Ambulation/Gait Assistance  3: Mod assist    Ambulation/Gait Assistance Details  2nd person guarding at trunk CGA while therapist assisted at RLE to advance when needed. Pt was given verbal cues to try to  increase left step length and slow down to try to facilitate more right weight shift. Pt was stopped a couple times to try to get more upright posture as leaning trunk to left when trying to advance right leg more.     Ambulation Distance (Feet)  230 Feet   60' x 1   Assistive device  Rolling walker   right AFO and danceskin on toe of shoe   Gait Pattern  Step-to pattern;Step-through pattern;Decreased weight shift to right;Decreased stance time - right;Decreased step length - right;Decreased step length - left;Poor foot clearance - right    Ambulation Surface  Level;Indoor      Neuro Re-ed    Neuro Re-ed Details   Standing at walker in front of mirror trying to get more upright posture with more equal weight through right leg. PT stabilizing at right knee and helping to facilitate weight shift to right at pelvis. Performed x 10. Standing at walker with right hand grip attachment stepping left foot forward and back to PT's foot in front x 10 with PT stabilizing at right knee for safety. Repeated with tapping 2" step with  left foot x 10. Pt was cued to try to stand with left foot on step but was not understanding to keep foot there. Sit to stand with 2" step under left foot to facilitate right weight shift with mirror in front for visual cues. Pt initally unsteady needing mod/max assist to maintain balance when rising too quickly. After cues to slow down patient was able to better control and get more activation in right leg min assist. Performed x 8. Pt was instructed to look in mirror and try to get straight prior to sitting. Standing at walker with bringing right leg forward and back x 10 min assist.             PT Education - 10/21/19 2019    Education Details  Pt to continue with current HEP    Person(s) Educated  Patient;Spouse    Methods  Explanation    Comprehension  Verbalized understanding       PT Short Term Goals - 10/05/19 1743      PT SHORT TERM GOAL #1   Title  Pt will be able to perform initial HEP with assist of husband for improved function.    Baseline  Husband reports they have been working on exercises.    Time  4    Period  Weeks    Status  Achieved    Target Date  09/25/19      PT SHORT TERM GOAL #2   Title  Pt will transfer w/c to/from mat with slideboard CGA with assist to set up slideboard for improved mobility.    Baseline  performing squat pivots and stand pivots    Time  4    Period  Weeks    Status  Achieved    Target Date  09/25/19      PT SHORT TERM GOAL #3   Title  Pt will be able to stand x 2 min mod assist with PT blocking right knee for improved standing balance.    Time  4    Period  Weeks    Status  Achieved    Target Date  09/25/19      PT SHORT TERM GOAL #4   Title  Pt will perform stand/pivot transfer mod assist of husband to allow improved safety with transfers at home.    Time  4  Period  Weeks    Status  Achieved    Target Date  09/25/19      PT SHORT TERM GOAL #5   Title  Pt will perform all bed mobility min assist or less for improved  mobility.    Time  4    Period  Weeks    Status  Partially Met    Target Date  09/25/19      PT SHORT TERM GOAL #6   Title  Pt will propel w/c  100' supervision for improved mobility in home.    Time  4    Period  Weeks    Status  Achieved    Target Date  09/25/19        PT Long Term Goals - 08/26/19 0841      PT LONG TERM GOAL #1   Title  Pt will be able to perform progressive HEP for strengthening, balance and mobility with assist of husband to continued gains at home.    Time  10    Period  Weeks    Status  New    Target Date  11/04/19      PT LONG TERM GOAL #2   Title  Pt will be able to perform all transfers min assist for improved safety with mobility.    Time  10    Period  Weeks    Status  New    Target Date  11/04/19      PT LONG TERM GOAL #3   Title  Pt will be able to ambulate 10' with max assist for improved access to bathroom at home with husband with appropriate AD.    Time  10    Period  Weeks    Status  New    Target Date  11/04/19            Plan - 10/21/19 2020    Clinical Impression Statement  Pt was having a little more trouble with advancing right leg with gait but more upright in her posture with PT assisting. Was able to slow down more with gait with cuing but has trouble slowing down with activities at times with difficulty understanding instruction. Pt still impulsive with things and needs constant assist with transfers due to this.    Personal Factors and Comorbidities  Comorbidity 3+    Comorbidities  anxiety, migraines, HTN    Examination-Activity Limitations  Bathing;Bed Mobility;Stand;Locomotion Level;Squat;Stairs;Dressing;Transfers    Examination-Participation Restrictions  Community Activity;Driving;Shop;Laundry;Meal Prep    Stability/Clinical Decision Making  Evolving/Moderate complexity    Rehab Potential  Fair    PT Frequency  2x / week    PT Duration  Other (comment)   10 weeks   PT Treatment/Interventions  ADLs/Self Care  Home Management;Electrical Stimulation;DME Instruction;Gait training;Stair training;Functional mobility training;Therapeutic activities;Therapeutic exercise;Orthotic Fit/Training;Patient/family education;Neuromuscular re-education;Balance training;Manual techniques;Passive range of motion;Vestibular;Wheelchair mobility training    PT Next Visit Plan  Recert due by 4/7. Assess vitals prior to therapy. Give picture for bridging over physioball, right hip flex/ext on physioball and supine assisted abd/add .  Danceskin to front of right shoe to help slide. Seated weight shifting to L, sit > stand on Steady reaching up and to the L.  Mirror therapy .  Use of powderboard, supine exercises (possibly add these to HEP); sit<>stand with block under L foot to shift weight to R, Increasing R stance time for increased swing with LLE    Consulted and Agree with Plan of Care  Patient;Family member/caregiver  Family Member Consulted  husband       Patient will benefit from skilled therapeutic intervention in order to improve the following deficits and impairments:  Abnormal gait, Decreased activity tolerance, Decreased balance, Decreased cognition, Decreased knowledge of use of DME, Decreased mobility, Decreased range of motion, Decreased safety awareness, Decreased strength, Impaired sensation  Visit Diagnosis: Muscle weakness (generalized)  Other abnormalities of gait and mobility     Problem List Patient Active Problem List   Diagnosis Date Noted  . Nontraumatic subcortical hemorrhage of left cerebral hemisphere ([place]) 08/27/2019  . Acute blood loss anemia   . Hypoalbuminemia due to protein-calorie malnutrition ([place])   . Thrombocytopenia ([place])   . Dysphagia 07/29/2019  . Infarction of left basal ganglia ([place]) 07/18/2019  . Hypernatremia   . Leukocytosis   . Essential hypertension   . Global aphasia   . Cytotoxic brain edema ([place]) 07/10/2019  . ICH (intracerebral hemorrhage) ([place]) 07/09/2019  .  Anxiety state 02/21/2015  . Cephalalgia 12/21/2014    Electa Sniff, PT, DPT, NCS 10/21/2019, 8:27 PM  [facility] [address] [place], [place], [address] Phone: [phone]   Fax:  [phone]  Name: Dearra Myhand MRN: [id number] Date of Birth: 01/03/1966

## 2019-10-21 NOTE — Telephone Encounter (Signed)
[name], husband of patient called about returning some equipment. According to telephone note 10/13/2019 by SW Loralee Pacas, husband was told to f/u with [facility]. I called husband to re-issue that statement and gave him the number to [facility].

## 2019-10-21 NOTE — Therapy (Signed)
[facility] [address] [place], [place], [address] Phone: [phone]   Fax:  [phone]  Occupational Therapy Treatment  Patient Details  Name: Melanie Madden MRN: [id number] Date of Birth: [birth year] Referring Provider (OT): Dr. Alysia Penna   Encounter Date: 10/21/2019  OT End of Session - 10/21/19 1225    Visit Number  14    Number of Visits  25    Date for OT Re-Evaluation  12/01/19    Authorization Type  [employer] 2021:  No prior auth OT, 60 DAY visit limit (regardless of how many therapies each day).    Authorization - Visit Number  17    Authorization - Number of Visits  47    OT Start Time  1022    OT Stop Time  1102    OT Time Calculation (min)  40 min    Activity Tolerance  Patient tolerated treatment well    Behavior During Therapy  Impulsive       Past Medical History:  Diagnosis Date  . Allergy   . Anxiety   . Arthritis   . High triglycerides   . Kidney stones   . Migraines   . Recurrent sinus infections     Past Surgical History:  Procedure Laterality Date  . ANTERIOR CRUCIATE LIGAMENT REPAIR Left   . BUNIONECTOMY Right   . LASIK    . LITHOTRIPSY    . MENISCUS REPAIR Left   . NASAL SEPTUM SURGERY    . SHOULDER SURGERY    . VARICOSE VEIN SURGERY Left     There were no vitals filed for this visit.  Subjective Assessment - 10/21/19 1213    Subjective   Pt denies pain    Patient is accompanied by:  Family member    Pertinent History  Subcortical hemorrhage of R cerbral hemisphere.  PMH:  HTN, anxiety, migraines, hx of shoulder injection approx 2 weeks ago for R shoulder pain, hx of R shoulder surgery (arthroscopic)    Limitations  aphasia, fall risk, impulsive (husband reports hx of OCD)    Special Tests  PER HUSBAND for sit>stand:  pt responds to "big push" with knee blocked on both side    Patient Stated Goals  husband reports: to be able to care for herself more (pt indicates that  she onces to return to previous activities per questioning/gestures)    Currently in Pain?  No/denies        Pt able to doff fleece shirt with snaps and pull overhead mod I today.  Transfer w/c>mat with min-mod A (therapist blocking knee), continues to have mild impulsivity with movement.  In sitting,  light wt bearing with body on arm movements (LUE reach to targets) to incr activation RUE/trunk with min assist/facilitation (visual, verbal, tactile cues given for normal movement patterns).  Incr RUE activation noted with reaching laterally with LUE today  NMES x 68min to R triceps 50pps, 250 pulse width, intensity=28, 10sec on/off cycle, 2sec ramp with no adverse reactions while performing  Light wt. Bearing in forward flex (low range) with BUEs, with mod facilitation  for incr RUE stabilization and trunk activation, light wt. Bearing/AAROM shoulder flex/elbow ext with tilted stool with min facilitation (able to sustain inconsistently during esim off cycle inconsistently depending on distractions).      In sitting, bilateral low-range functional reach for RUE stabilization/AAROM from in small range with mod facilitation and cueing for normal movement patterns.    Stabilizing bottle in R  hand on lap while opening lid with LUE with min facilitation/cueing, inconsistent with attention       OT Education - 10/21/19 1224    Education Details  Recommended trying to place bottle in R hand and hold while opening with LUE    Person(s) Educated  Patient;Spouse    Methods  Explanation;Demonstration;Verbal cues;Tactile cues    Comprehension  Verbalized understanding;Returned demonstration;Verbal cues required       OT Short Term Goals - 10/12/19 1216      OT SHORT TERM GOAL #1   Title  Pt/husband will be independent with initial HEP for RUE ROM--check STGs 10/14/19    Time  6    Period  Weeks    Status  Achieved      OT SHORT TERM GOAL #2   Title  Pt will perform UB dressing with min A.     Time  6    Period  Weeks    Status  On-going   MOD ASSIST     OT SHORT TERM GOAL #3   Title  Pt will perform UB bathing with mod A.    Time  6    Period  Weeks    Status  Achieved      OT SHORT TERM GOAL #4   Title  Pt will be able to brush hair with set-up.    Time  6    Period  Weeks    Status  On-going   fluctuating assist needed     OT SHORT TERM GOAL #5   Title  Pt will demo at least 110* shoulder flex PROM in supine without pain for incr ease for ADLs.    Time  6    Period  Weeks    Status  Achieved        OT Long Term Goals - 09/02/19 1229      OT LONG TERM GOAL #1   Title  Pt/husband will be independent with HEP for functional activities for incr participation in IADLs/leisure tasks and for cognition (and incorporating RUE if able).--check LTGs 12/01/19    Time  12    Period  Weeks    Status  New      OT LONG TERM GOAL #2   Title  Pt will perform UB dressing with supervision.    Time  12    Period  Weeks    Status  New      OT LONG TERM GOAL #3   Title  Pt will perform LB dressing with mod A.    Time  12    Period  Weeks    Status  New      OT LONG TERM GOAL #4   Title  Pt will perform UB bathing with min A.    Time  12    Period  Weeks    Status  New      OT LONG TERM GOAL #5   Title  Pt will perform simple cold snack prep from w/c level with min A/set up.    Time  12    Period  Weeks    Status  New      OT LONG TERM GOAL #6   Title  Pt will perform toileting with mod A.    Time  12    Period  Weeks    Status  New            Plan - 10/21/19 1227    Clinical  Impression Statement  Pt is progessing slowly due to severity of deficits.  However, pt demo decr impulsivity, improved understanding of tactile/verbal/demo cues, improved frustration tolerance, and incr stabilization of RUE, but is still inconsistent overall due to decr attention (easily distracted by end of session).    Occupational performance deficits (Please refer to  evaluation for details):  ADL's;IADL's;Leisure;Work;Social Participation    Body Structure / Function / Physical Skills  ADL;ROM;IADL;Sensation;Mobility;Strength;Tone;UE functional use;Decreased knowledge of precautions;Decreased knowledge of use of DME;Coordination;Balance;FMC;GMC    Cognitive Skills  Attention;Temperament/Personality;Understand;Thought;Safety Awareness    Rehab Potential  Good    OT Frequency  2x / week    OT Duration  12 weeks    OT Treatment/Interventions  Self-care/ADL training;Moist Heat;DME and/or AE instruction;Splinting;Therapeutic activities;Aquatic Therapy;Cognitive remediation/compensation;Therapeutic exercise;Cryotherapy;Neuromuscular education;Functional Mobility Training;Passive range of motion;Visual/perceptual remediation/compensation;Patient/family education;Manual Therapy;Electrical Stimulation;Fluidtherapy    Plan  continue to address ADLs, neuro re-ed including use of tilted stool and wt. bearing    Consulted and Agree with Plan of Care  Patient;Family member/caregiver    Family Member Consulted  husband       Patient will benefit from skilled therapeutic intervention in order to improve the following deficits and impairments:   Body Structure / Function / Physical Skills: ADL, ROM, IADL, Sensation, Mobility, Strength, Tone, UE functional use, Decreased knowledge of precautions, Decreased knowledge of use of DME, Coordination, Balance, FMC, GMC Cognitive Skills: Attention, Temperament/Personality, Understand, Thought, Safety Awareness     Visit Diagnosis: Hemiplegia and hemiparesis following nontraumatic intracerebral hemorrhage affecting right dominant side (HCC)  Muscle weakness (generalized)  Other abnormalities of gait and mobility  Unspecified symptoms and signs involving cognitive functions following other nontraumatic intracranial hemorrhage  Other disturbances of skin sensation    Problem List Patient Active Problem List   Diagnosis Date  Noted  . Nontraumatic subcortical hemorrhage of left cerebral hemisphere ([place]) 08/27/2019  . Acute blood loss anemia   . Hypoalbuminemia due to protein-calorie malnutrition ([place])   . Thrombocytopenia ([place])   . Dysphagia 07/29/2019  . Infarction of left basal ganglia ([place]) 07/18/2019  . Hypernatremia   . Leukocytosis   . Essential hypertension   . Global aphasia   . Cytotoxic brain edema ([place]) 07/10/2019  . ICH (intracerebral hemorrhage) ([place]) 07/09/2019  . Anxiety state 02/21/2015  . Cephalalgia 12/21/2014    [facility] 10/21/2019, 12:38 PM  [facility] [address] [place], [place], [address] Phone: [phone]   Fax:  [phone]  Name: Tishawna Kimbrell MRN: [id number] Date of Birth: 1965/10/14   Vianne Bulls, OTR/L [facility] [address] [place], [place]  [address] [phone] phone [phone] 10/21/19 1:07 PM

## 2019-10-22 ENCOUNTER — Encounter: Payer: Self-pay | Admitting: Physical Medicine & Rehabilitation

## 2019-10-22 ENCOUNTER — Encounter
Payer: Managed Care, Other (non HMO) | Attending: Physical Medicine & Rehabilitation | Admitting: Physical Medicine & Rehabilitation

## 2019-10-22 VITALS — BP 141/85 | HR 91 | Temp 97.4°F | Ht 67.0 in | Wt 124.0 lb

## 2019-10-22 DIAGNOSIS — R4701 Aphasia: Secondary | ICD-10-CM | POA: Diagnosis not present

## 2019-10-22 DIAGNOSIS — G8101 Flaccid hemiplegia affecting right dominant side: Secondary | ICD-10-CM | POA: Diagnosis not present

## 2019-10-22 DIAGNOSIS — I610 Nontraumatic intracerebral hemorrhage in hemisphere, subcortical: Secondary | ICD-10-CM

## 2019-10-22 NOTE — Progress Notes (Signed)
Subjective:    Patient ID: Melanie Madden, female    DOB: Nov 27, 1965, 54 y.o.   MRN: [id number] 54 year old right-handed female with history of hypertension, anxiety, migraine headaches and tobacco abuse. Per chart review and husband,  patient lives with spouse and was independent prior to admission. She presented on 07/09/2019 with right-sided weakness and aphasia after returning home from work. Cranial CT scan showed intraparenchymal hematoma with a volume of 44 cc centered in the left basal ganglia most consistent with hypertensive hemorrhage. 5 mm rightward midline shift. MRA of the head negative. Echocardiogram with ejection fraction of 65%. Patient did not receive TPA due to [place]. Admission labs SARS coronavirus negative. Patient initially on 3% saline. Cleviprex for blood pressure control later discontinued. Her diet was advanced to dysphagia #2 nectar thick liquid. She was cleared to begin subcutaneous heparin for DVT prophylaxis 07/14/2019.  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  She is approximately 3 months post stroke.  She is living at home with her husband and is now receiving outpatient therapy services with PT OT and speech. Her appetite is good She is voiding frequently and does drink quite a bit of fluid before going to bed.  Her husband states that he has not had a good night sleep since she has been home because she gets up about every hour or so.  We discussed medications to slow down the bladder the patient does not wish to start another medicine at this time.  The patient has had no falls. Very occasionally the patient can get a word out that is understandable.  She said yeah today Pain Inventory Average Pain 0 Pain Right Now 0 My pain is none  In the last 24 hours, has pain interfered with the following? General activity 0 Relation with others 0 Enjoyment of life 0 What TIME of day is your  pain at its worst? none Sleep (in general) Good  Pain is worse with: none Pain improves with: none Relief from Meds: 0  Mobility ability to climb steps?  no do you drive?  no  Function employed # of hrs/week 30  Neuro/Psych weakness trouble walking  Prior Studies none  Physicians involved in your care none   Family History  Problem Relation Age of Onset  . Alzheimer's disease Mother   . Hypertension Mother   . Diabetes Mother   . Thyroid disease Mother   . Deep vein thrombosis Maternal Grandmother   . Colon cancer Neg Hx   . Colon polyps Neg Hx   . Esophageal cancer Neg Hx   . Pancreatic cancer Neg Hx   . Rectal cancer Neg Hx   . Stomach cancer Neg Hx   . Breast cancer Neg Hx    Social History   Socioeconomic History  . Marital status: Significant Other    Spouse name: Not on file  . Number of children: Not on file  . Years of education: Not on file  . Highest education level: Not on file  Occupational History  . Not on file  Tobacco Use  . Smoking status: Former Research scientist (life sciences)  . Smokeless tobacco: Never Used  Substance and Sexual Activity  . Alcohol use: Yes    Alcohol/week: 0.0 standard drinks    Comment: social   . Drug use: No  . Sexual activity: Yes    Partners: Male  Other Topics Concern  . Not on file  Social History Narrative  . Not  on file   Social Determinants of Health   Financial Resource Strain:   . Difficulty of Paying Living Expenses:   Food Insecurity:   . Worried About Charity fundraiser in the Last Year:   . Arboriculturist in the Last Year:   Transportation Needs:   . Film/video editor (Medical):   Marland Kitchen Lack of Transportation (Non-Medical):   Physical Activity:   . Days of Exercise per Week:   . Minutes of Exercise per Session:   Stress:   . Feeling of Stress :   Social Connections:   . Frequency of Communication with Friends and Family:   . Frequency of Social Gatherings with Friends and Family:   . Attends Religious  Services:   . Active Member of Clubs or Organizations:   . Attends Archivist Meetings:   Marland Kitchen Marital Status:    Past Surgical History:  Procedure Laterality Date  . ANTERIOR CRUCIATE LIGAMENT REPAIR Left   . BUNIONECTOMY Right   . LASIK    . LITHOTRIPSY    . MENISCUS REPAIR Left   . NASAL SEPTUM SURGERY    . SHOULDER SURGERY    . VARICOSE VEIN SURGERY Left    Past Medical History:  Diagnosis Date  . Allergy   . Anxiety   . Arthritis   . High triglycerides   . Kidney stones   . Migraines   . Recurrent sinus infections    There were no vitals taken for this visit.  Opioid Risk Score:   Fall Risk Score:  `1  Depression screen PHQ 2/9  Depression screen PHQ 2/9 09/25/2019  Decreased Interest 0  Down, Depressed, Hopeless 1  PHQ - 2 Score 1  Altered sleeping 0  Tired, decreased energy 0  Change in appetite 0  Feeling bad or failure about yourself  0  Trouble concentrating 0  Moving slowly or fidgety/restless 1  Suicidal thoughts 0  PHQ-9 Score 2  Difficult doing work/chores Somewhat difficult    Review of Systems  All other systems reviewed and are negative.      Objective:   Physical Exam Vitals and nursing note reviewed.  Constitutional:      Appearance: Normal appearance. She is normal weight.  Eyes:     Extraocular Movements: Extraocular movements intact.     Conjunctiva/sclera: Conjunctivae normal.     Pupils: Pupils are equal, round, and reactive to light.  Musculoskeletal:        General: Normal range of motion.  Skin:    General: Skin is warm and dry.  Neurological:     General: No focal deficit present.     Mental Status: She is alert and oriented to person, place, and time.     Comments: Motor strength is trace right finger flexors 0 at the finger extensors 0 at the wrist flexors and extensors 0 at the elbow flexors and extensors 0 at the shoulder  Trace hip knee extensor synergy right lower limb otherwise 0/5 Left upper extremity  5/5 Left lower extremity 5/5 Sensation unable to assess secondary to severe aphasia Patient reads gestural cues and also patient does repetitive hand movements with the left upper extremity as part of her communication.  She does not have any yes no gestures, head nods appear inconsistent  Psychiatric:        Mood and Affect: Mood normal.        Behavior: Behavior normal.    Musculoskeletal Positive impingement test right upper  extremity she has good external rotation at the shoulder.  No pain with elbow wrist or hand range of motion. Tone MAS 3 in the right thumb flexor otherwise MAS 0 at the finger flexors wrist flexors and elbow flexors as well as elbow extensors.  Extremities minimal edema dorsum of the right hand      Assessment & Plan:  #1.  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  She is making some slow progress in rehabilitation.  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 She has had minimal motor recovery in the right upper extremity.  Slightly greater in the right lower extremity  #2.  Right shoulder pain with subluxation.  The patient has a prior history of shoulder pain and has had shoulder injections in the past.  She does not wish to pursue this route at this time.  She is continue with the neuromuscular stimulation unit.  #3.  Neurogenic bladder with frequent urination this has been complicated by drinking fairly large amounts of fluid at night.  We had discussed oxybutynin as a potential treatment patient does not wish to pursue this at this time I am not sure whether she has actual ability to understand anything more than just another pill to take.  Her husband would certainly benefit from this since he is the primary caregiver and has not had a good night sleep for several weeks  Over half of the 30 min visit was spent counseling and coordinating care.

## 2019-10-22 NOTE — Patient Instructions (Signed)
Oxybutynin tablets What is this medicine? OXYBUTYNIN (ox i BYOO ti nin) is used to treat overactive bladder. This medicine reduces the amount of bathroom visits. It may also help to control wetting accidents. This medicine may be used for other purposes; ask your health care provider or pharmacist if you have questions. COMMON BRAND NAME(S): Ditropan What should I tell my health care provider before I take this medicine? They need to know if you have any of these conditions:  autonomic neuropathy  dementia  difficulty passing urine  glaucoma  intestinal obstruction  kidney disease  liver disease  myasthenia gravis  Parkinson's disease  an unusual or allergic reaction to oxybutynin, other medicines, foods, dyes, or preservatives  pregnant or trying to get pregnant  breast-feeding How should I use this medicine? Take this medicine by mouth with a glass of water. Follow the directions on the prescription label. You can take this medicine with or without food. Take your medicine at regular intervals. Do not take your medicine more often than directed. Talk to your pediatrician regarding the use of this medicine in children. Special care may be needed. While this drug may be prescribed for children as young as 5 years for selected conditions, precautions do apply. Overdosage: If you think you have taken too much of this medicine contact a poison control center or emergency room at once. NOTE: This medicine is only for you. Do not share this medicine with others. What if I miss a dose? If you miss a dose, take it as soon as you can. If it is almost time for your next dose, take only that dose. Do not take double or extra doses. What may interact with this medicine?  antihistamines for allergy, cough and cold  atropine  certain medicines for bladder problems like oxybutynin, tolterodine  certain medicines for Parkinson's disease like benztropine, trihexyphenidyl  certain  medicines for stomach problems like dicyclomine, hyoscyamine  certain medicines for travel sickness like scopolamine  clarithromycin  erythromycin  ipratropium  medicines for fungal infections, like fluconazole, itraconazole, ketoconazole or voriconazole This list may not describe all possible interactions. Give your health care provider a list of all the medicines, herbs, non-prescription drugs, or dietary supplements you use. Also tell them if you smoke, drink alcohol, or use illegal drugs. Some items may interact with your medicine. What should I watch for while using this medicine? It may take a few weeks to notice the full benefit from this medicine. You may need to limit your intake tea, coffee, caffeinated sodas, and alcohol. These drinks may make your symptoms worse. You may get drowsy or dizzy. Do not drive, use machinery, or do anything that needs mental alertness until you know how this medicine affects you. Do not stand or sit up quickly, especially if you are an older patient. This reduces the risk of dizzy or fainting spells. Alcohol may interfere with the effect of this medicine. Avoid alcoholic drinks. Your mouth may get dry. Chewing sugarless gum or sucking hard candy, and drinking plenty of water may help. Contact your doctor if the problem does not go away or is severe. This medicine may cause dry eyes and blurred vision. If you wear contact lenses, you may feel some discomfort. Lubricating drops may help. See your eyecare professional if the problem does not go away or is severe. Avoid extreme heat. This medicine can cause you to sweat less than normal. Your body temperature could increase to dangerous levels, which may lead to heat  stroke. What side effects may I notice from receiving this medicine? Side effects that you should report to your doctor or health care professional as soon as possible:  allergic reactions like skin rash, itching or hives, swelling of the face,  lips, or tongue  agitation  breathing problems  confusion  fever  flushing (reddening of the skin)  hallucinations  memory loss  pain or difficulty passing urine  palpitations  unusually weak or tired Side effects that usually do not require medical attention (report to your doctor or health care professional if they continue or are bothersome):  constipation  headache  sexual difficulties (impotence) This list may not describe all possible side effects. Call your doctor for medical advice about side effects. You may report side effects to FDA at 1-800-FDA-1088. Where should I keep my medicine? Keep out of the reach of children. Store at room temperature between 15 and 30 degrees C (59 and 86 degrees F). Protect from moisture and humidity. Throw away any unused medicine after the expiration date. NOTE: This sheet is a summary. It may not cover all possible information. If you have questions about this medicine, talk to your doctor, pharmacist, or health care provider.  2020 Elsevier/Gold Standard (2013-10-01 10:57:08)

## 2019-10-23 ENCOUNTER — Ambulatory Visit: Payer: Managed Care, Other (non HMO) | Admitting: Physical Medicine & Rehabilitation

## 2019-10-26 ENCOUNTER — Ambulatory Visit: Payer: Managed Care, Other (non HMO)

## 2019-10-26 ENCOUNTER — Ambulatory Visit: Payer: Managed Care, Other (non HMO) | Admitting: Occupational Therapy

## 2019-10-26 ENCOUNTER — Other Ambulatory Visit: Payer: Self-pay

## 2019-10-26 DIAGNOSIS — M6281 Muscle weakness (generalized): Secondary | ICD-10-CM

## 2019-10-26 DIAGNOSIS — R4701 Aphasia: Secondary | ICD-10-CM

## 2019-10-26 DIAGNOSIS — R2689 Other abnormalities of gait and mobility: Secondary | ICD-10-CM

## 2019-10-26 DIAGNOSIS — I69151 Hemiplegia and hemiparesis following nontraumatic intracerebral hemorrhage affecting right dominant side: Secondary | ICD-10-CM

## 2019-10-26 DIAGNOSIS — I69153 Hemiplegia and hemiparesis following nontraumatic intracerebral hemorrhage affecting right non-dominant side: Secondary | ICD-10-CM

## 2019-10-26 DIAGNOSIS — R482 Apraxia: Secondary | ICD-10-CM

## 2019-10-26 DIAGNOSIS — R1310 Dysphagia, unspecified: Secondary | ICD-10-CM

## 2019-10-26 DIAGNOSIS — R41841 Cognitive communication deficit: Secondary | ICD-10-CM

## 2019-10-26 NOTE — Therapy (Signed)
[facility] [address] [place], [place], [address] Phone: [phone]   Fax:  [phone]  Speech Language Pathology Treatment  Patient Details  Name: Melanie Madden MRN: [id number] Date of Birth: 17-Nov-1965 Referring Provider (SLP): Alysia Penna, MD   Encounter Date: 10/26/2019  End of Session - 10/26/19 1300    Visit Number  16    Number of Visits  25    Date for SLP Re-Evaluation  12/04/19    Authorization Type  cigna    Authorization Time Period  60 DAYS of therapy regardless of number disciplines per day    SLP Start Time  0934    SLP Stop Time   1015    SLP Time Calculation (min)  41 min    Activity Tolerance  Patient tolerated treatment well       Past Medical History:  Diagnosis Date  . Allergy   . Anxiety   . Arthritis   . High triglycerides   . Kidney stones   . Migraines   . Recurrent sinus infections     Past Surgical History:  Procedure Laterality Date  . ANTERIOR CRUCIATE LIGAMENT REPAIR Left   . BUNIONECTOMY Right   . LASIK    . LITHOTRIPSY    . MENISCUS REPAIR Left   . NASAL SEPTUM SURGERY    . SHOULDER SURGERY    . VARICOSE VEIN SURGERY Left     There were no vitals filed for this visit.  Subjective Assessment - 10/26/19 1254    Subjective  Husband left room for ST session today.    Currently in Pain?  No/denies            ADULT SLP TREATMENT - 10/26/19 1254      General Information   Behavior/Cognition  Alert;Cooperative;Doesn't follow directions;Requires cueing;Impulsive;Agitated      Treatment Provided   Treatment provided  Cognitive-Linquistic      Cognitive-Linquistic Treatment   Treatment focused on  Aphasia;Apraxia    Skilled Treatment  SLP gave pt cues today (auditory and visual) for use of her signs, with 50% success. SLP ansked pt question about breakfast and navigated pt to breakfast page on [employer]: pt indicated "eggs"_> "scrambled eggs", "coffee"  immediately. Coffee and orange juice were ID'd with extra time and attention to all icons (might be too many causing pt to lose attention). Pt attempted to communicate something about her hand but SLP was unable to understand - SLP ascertained it was something with OT, and about her hand. Pt's yes/no have been inconsistent to SLP is not 100% sure this was accurate - no family to confirm today in session.  SLP told pt husband today SLP was recommending [employer] trial but wants to discuss more with pt/husband next session.      Assessment / Recommendations / Plan   Plan  Continue with current plan of care      Progression Toward Goals   Progression toward goals  Progressing toward goals       SLP Education - 10/26/19 1259    Education Details  accessing icons on [place], SLP recommneding lingraphica trial for pt    Person(s) Educated  Patient    Methods  Explanation;Verbal cues;Demonstration    Comprehension  Verbalized understanding;Need further instruction       SLP Short Term Goals - 10/07/19 1239      SLP SHORT TERM GOAL #1   Title  pt will simultaneously with SLP produce 3 different phonemes 50%  of the time over three sessions    Status  Partially Met      SLP SHORT TERM GOAL #2   Title  pt will ID objects salient to pt in f:4 80% of the time over 3 sessions    Status  Deferred      SLP SHORT TERM GOAL #3   Title  pt will write her full name on first attempt over three sessions    Time  1    Status  Deferred      SLP SHORT TERM GOAL #4   Title  pt will attempt expressive communication via multimodal means during 5 sessions    Status  Not Met      SLP SHORT TERM GOAL #5   Title  pt will demonstrate undertanding of simple 1-step functional commands with 70% accuracy with multimodal cues over 3 sessions    Status  Not Met      SLP SHORT TERM GOAL #6   Title  pt will look at object or line drawing and match the correct word from f:6 words 90% success over 3 sessions     Status  Deferred       SLP Long Term Goals - 10/26/19 1303      SLP LONG TERM GOAL #1   Title  pt's diet will be appropriately upgraded as needed by SLP    Time  4    Period  Weeks   or 25 total visits, for all LTGs   Status  On-going      SLP LONG TERM GOAL #2   Title  pt will produce 3 simple words/words pertinent to pt (family or pet names, etc) simultaneously with SLP and multimodal cues over three sessions    Time  4    Period  Weeks    Status  Revised      SLP LONG TERM GOAL #3   Title  pt will demo understanding of simple yes/no questions pertinent to desires/wants with multimodal cues 90% of the time over 3 sessions    Time  4    Period  Weeks    Status  On-going      SLP LONG TERM GOAL #4   Title  a device trial with a speech generating device will be initiated within the first 20 ST visits    Time  4    Period  Weeks    Status  On-going      SLP LONG TERM GOAL #5   Title  pt will demo recognition of written word for a common object 100% success over 3 sessions    Time  4    Period  Weeks    Status  On-going      SLP LONG TERM GOAL #6   Title  pt will work on speech-related tasks at home between 75% of sessions as reported by family and/or noted on [place] website    Baseline  10-21-19, 10-26-19    Time  4    Period  Weeks    Status  On-going       Plan - 10/26/19 1300    Clinical Impression Statement  Cont with no complaints with dys III diet/thin, to SLP knowledge. Pt indicated she has been using computer at least twice a day. PT WILL NEED TO CONT TO COMPLY with work at home for [place] to continue. Pt cont to demo cognitive communication deficits in at least the areas of attention (incr'd  impulsivity) and awareness (unaware of verbal deficits -vs/in addition to receptive languge deficit). Pt cont to present with a severe/profound expressive aphasia with verbal apraxia, and severe/profound receptive aphasia ("global aphasia"). See "skilled  treatment" for more details of today's session. Pt did not complete entire WAB due to her incr'd frustration with expressive communication. At some point pt may benefit from a speech generating device. She would cont to benefit from skilled ST focusing on incr'ing communicative effectiveness via multimodal means and incr'ing functional communication (verbal or nonverbal), incr'ing cognition in functional ways as able given pt's language deficit, and monitoring/upgrading pt's diet as appropriate.Pt knows she will need to participate at home for therapy here to be of any assistance.    Speech Therapy Frequency  2x / week    Duration  --   8 weeks   Treatment/Interventions  Diet toleration management by SLP;Trials of upgraded texture/liquids;Language facilitation;Internal/external aids;Cognitive reorganization;Cueing hierarchy;SLP instruction and feedback;Patient/family education;Compensatory strategies;Multimodal communcation approach;Functional tasks;Environmental controls    Potential to Achieve Goals  Fair    Potential Considerations  Severity of impairments;Cooperation/participation level       Patient will benefit from skilled therapeutic intervention in order to improve the following deficits and impairments:   Aphasia  Verbal apraxia  Cognitive communication deficit  Dysphagia, unspecified type    Problem List Patient Active Problem List   Diagnosis Date Noted  . Nontraumatic subcortical hemorrhage of left cerebral hemisphere ([place]) 08/27/2019  . Acute blood loss anemia   . Hypoalbuminemia due to protein-calorie malnutrition ([place])   . Thrombocytopenia ([place])   . Dysphagia 07/29/2019  . Infarction of left basal ganglia ([place]) 07/18/2019  . Hypernatremia   . Leukocytosis   . Essential hypertension   . Global aphasia   . Cytotoxic brain edema ([place]) 07/10/2019  . ICH (intracerebral hemorrhage) ([place]) 07/09/2019  . Anxiety state 02/21/2015  . Cephalalgia 12/21/2014    [place]  ,[place], [place]  10/26/2019, 1:04 PM  [facility] [address] [place], [place], [address] Phone: [phone]   Fax:  [phone]   Name: Farra Nikolic MRN: [id number] Date of Birth: 1966-01-27

## 2019-10-26 NOTE — Therapy (Signed)
[facility] [address] [place], [place], [address] Phone: [phone]   Fax:  [phone]  Occupational Therapy Treatment  Patient Details  Name: Melanie Madden MRN: [id number] Date of Birth: Nov 05, 1965 Referring Provider (OT): Dr. Alysia Penna   Encounter Date: 10/26/2019  OT End of Session - 10/26/19 1138    Visit Number  15    Number of Visits  25    Date for OT Re-Evaluation  12/01/19    Authorization Type  [employer] 2021:  No prior auth OT, 60 DAY visit limit (regardless of how many therapies each day).    Authorization - Visit Number  18    Authorization - Number of Visits  46    OT Start Time  [id number]    OT Stop Time  1100    OT Time Calculation (min)  42 min    Activity Tolerance  Patient tolerated treatment well    Behavior During Therapy  Impulsive       Past Medical History:  Diagnosis Date  . Allergy   . Anxiety   . Arthritis   . High triglycerides   . Kidney stones   . Migraines   . Recurrent sinus infections     Past Surgical History:  Procedure Laterality Date  . ANTERIOR CRUCIATE LIGAMENT REPAIR Left   . BUNIONECTOMY Right   . LASIK    . LITHOTRIPSY    . MENISCUS REPAIR Left   . NASAL SEPTUM SURGERY    . SHOULDER SURGERY    . VARICOSE VEIN SURGERY Left     There were no vitals filed for this visit.  Subjective Assessment - 10/26/19 1138    Subjective   Pt denies pain    Patient is accompanied by:  Family member    Pertinent History  Subcortical hemorrhage of R cerbral hemisphere.  PMH:  HTN, anxiety, migraines, hx of shoulder injection approx 2 weeks ago for R shoulder pain, hx of R shoulder surgery (arthroscopic)    Limitations  aphasia, fall risk, impulsive (husband reports hx of OCD)    Special Tests  PER HUSBAND for sit>stand:  pt responds to "big push" with knee blocked on both side    Patient Stated Goals  husband reports: to be able to care for herself more (pt indicates that  she onces to return to previous activities per questioning/gestures)    Currently in Pain?  No/denies              Treatment: Pt. transferred to mat with min A. Seated edge of mat , weight bearing though right hand edge of mat with therapist facilitating weight shift to R side. NMES  to triceps(50 pps, 250 pw, 10 secs  Cycle, intensity 24 x 15 mins)  while weightbearing though tilted stool,min-mod facilitation by therapist NMES same parameters to wrist and finger extensors while weightbearing through medium ball with right elbow and therapist facilitating wrist and finger extension during on cycle x 8 mins, no adverse reactions, mild pink coloration at electrode sites. Pt donned her jacket with min-mod A.               OT Short Term Goals - 10/12/19 1216      OT SHORT TERM GOAL #1   Title  Pt/husband will be independent with initial HEP for RUE ROM--check STGs 10/14/19    Time  6    Period  Weeks    Status  Achieved      OT  SHORT TERM GOAL #2   Title  Pt will perform UB dressing with min A.    Time  6    Period  Weeks    Status  On-going   MOD ASSIST     OT SHORT TERM GOAL #3   Title  Pt will perform UB bathing with mod A.    Time  6    Period  Weeks    Status  Achieved      OT SHORT TERM GOAL #4   Title  Pt will be able to brush hair with set-up.    Time  6    Period  Weeks    Status  On-going   fluctuating assist needed     OT SHORT TERM GOAL #5   Title  Pt will demo at least 110* shoulder flex PROM in supine without pain for incr ease for ADLs.    Time  6    Period  Weeks    Status  Achieved        OT Long Term Goals - 09/02/19 1229      OT LONG TERM GOAL #1   Title  Pt/husband will be independent with HEP for functional activities for incr participation in IADLs/leisure tasks and for cognition (and incorporating RUE if able).--check LTGs 12/01/19    Time  12    Period  Weeks    Status  New      OT LONG TERM GOAL #2   Title  Pt will perform  UB dressing with supervision.    Time  12    Period  Weeks    Status  New      OT LONG TERM GOAL #3   Title  Pt will perform LB dressing with mod A.    Time  12    Period  Weeks    Status  New      OT LONG TERM GOAL #4   Title  Pt will perform UB bathing with min A.    Time  12    Period  Weeks    Status  New      OT LONG TERM GOAL #5   Title  Pt will perform simple cold snack prep from w/c level with min A/set up.    Time  12    Period  Weeks    Status  New      OT LONG TERM GOAL #6   Title  Pt will perform toileting with mod A.    Time  12    Period  Weeks    Status  New            Plan - 10/26/19 1140    Clinical Impression Statement  Pt is progressing towards goals with improving sitting balance and ability to initiate beginning activation in RUE.    Occupational performance deficits (Please refer to evaluation for details):  ADL's;IADL's;Leisure;Work;Social Participation    Body Structure / Function / Physical Skills  ADL;ROM;IADL;Sensation;Mobility;Strength;Tone;UE functional use;Decreased knowledge of precautions;Decreased knowledge of use of DME;Coordination;Balance;FMC;GMC    Cognitive Skills  Attention;Temperament/Personality;Understand;Thought;Safety Awareness    Rehab Potential  Good    OT Frequency  2x / week    OT Duration  12 weeks    OT Treatment/Interventions  Self-care/ADL training;Moist Heat;DME and/or AE instruction;Splinting;Therapeutic activities;Aquatic Therapy;Cognitive remediation/compensation;Therapeutic exercise;Cryotherapy;Neuromuscular education;Functional Mobility Training;Passive range of motion;Visual/perceptual remediation/compensation;Patient/family education;Manual Therapy;Electrical Stimulation;Fluidtherapy    Plan  continue to address ADLs, neuro re-ed including use of tilted stool and wt.  bearing    Consulted and Agree with Plan of Care  Patient;Family member/caregiver    Family Member Consulted  husband       Patient will  benefit from skilled therapeutic intervention in order to improve the following deficits and impairments:   Body Structure / Function / Physical Skills: ADL, ROM, IADL, Sensation, Mobility, Strength, Tone, UE functional use, Decreased knowledge of precautions, Decreased knowledge of use of DME, Coordination, Balance, FMC, GMC Cognitive Skills: Attention, Temperament/Personality, Understand, Thought, Safety Awareness     Visit Diagnosis: Hemiplegia and hemiparesis following nontraumatic intracerebral hemorrhage affecting right dominant side (HCC)  Muscle weakness (generalized)  Other abnormalities of gait and mobility  Hemiplegia and hemiparesis following nontraumatic intracerebral hemorrhage affecting right non-dominant side (HCC)    Problem List Patient Active Problem List   Diagnosis Date Noted  . Nontraumatic subcortical hemorrhage of left cerebral hemisphere ([place]) 08/27/2019  . Acute blood loss anemia   . Hypoalbuminemia due to protein-calorie malnutrition ([place])   . Thrombocytopenia ([place])   . Dysphagia 07/29/2019  . Infarction of left basal ganglia ([place]) 07/18/2019  . Hypernatremia   . Leukocytosis   . Essential hypertension   . Global aphasia   . Cytotoxic brain edema ([place]) 07/10/2019  . ICH (intracerebral hemorrhage) ([place]) 07/09/2019  . Anxiety state 02/21/2015  . Cephalalgia 12/21/2014    Latonga Ponder 10/26/2019, 11:41 AM  [facility] [address] [place], [place], [address] Phone: [phone]   Fax:  [phone]  Name: Ranezmae Cramblit MRN: [id number] Date of Birth: September 13, 1965

## 2019-10-26 NOTE — Therapy (Signed)
[facility] [address] [place], [place], [address] Phone: [phone]   Fax:  [phone]  Physical Therapy Treatment  Patient Details  Name: Melanie Madden MRN: [id number] Date of Birth: [birth year] Referring Provider (PT): Lauraine Rinne, [place]   Encounter Date: 10/26/2019  PT End of Session - 10/26/19 1104    Visit Number  16    Number of Visits  20    Date for PT Re-Evaluation  11/24/19    Authorization Type  [employer]- 60 VL combined PT/OT/ST - counts as 1 visit if seen on same day.  Not sure if 60 is hard max.  $50 copay each day - make sure PT/OT/ST on same day if possible    Authorization - Visit Number  18   PT/OT/ST days combined   Authorization - Number of Visits  20    PT Start Time  1101    PT Stop Time  1145    PT Time Calculation (min)  44 min    Equipment Utilized During Treatment  Gait belt   pt's R AFO; added forefront wedge   Activity Tolerance  Patient tolerated treatment well    Behavior During Therapy  Impulsive       Past Medical History:  Diagnosis Date  . Allergy   . Anxiety   . Arthritis   . High triglycerides   . Kidney stones   . Migraines   . Recurrent sinus infections     Past Surgical History:  Procedure Laterality Date  . ANTERIOR CRUCIATE LIGAMENT REPAIR Left   . BUNIONECTOMY Right   . LASIK    . LITHOTRIPSY    . MENISCUS REPAIR Left   . NASAL SEPTUM SURGERY    . SHOULDER SURGERY    . VARICOSE VEIN SURGERY Left     There were no vitals filed for this visit.  Subjective Assessment - 10/26/19 1104    Subjective  Pt denies any new concerns. Did bump right elbow so has a protective sleeve on it.    Patient is accompained by:  Family member    Pertinent History  anxiety, migraines, HTN    Patient Stated Goals  Per husband report they would like her to get stronger on right, improve mobility, and work on speech.    Currently in Pain?  No/denies                        [facility] Adult PT Treatment/Exercise - 10/26/19 1105      Bed Mobility   Bed Mobility  Rolling Right;Rolling Left;Supine to Sit;Sit to Supine    Rolling Right  Supervision/verbal cueing    Rolling Left  Minimal Assistance - Patient > 75%    Supine to Sit  Supervision/Verbal cueing    Sit to Supine  Supervision/Verbal cueing      Transfers   Transfers  Sit to Stand;Stand to Sit    Sit to Stand  4: Min guard;4: Min assist    Sit to Stand Details  Verbal cues for technique    Stand to Sit  4: Min guard;4: Min assist    Stand to Sit Details (indicate cue type and reason)  Verbal cues for technique;Verbal cues for precautions/safety    Stand to Sit Details  Pt needs assist to remove right hand from walker prior to sitting.      Ambulation/Gait   Ambulation/Gait  Yes    Ambulation/Gait Assistance  4: Min assist;3: Mod  assist    Ambulation/Gait Assistance Details  1 PT at right leg to help with proper placement min assist and 2nd therapist at trunk with tactile cues at left shoulder and right hip to try to improve more upright posture. PT on trunk only CGA for safety and tactile cuing for posture. Pt was also given verbal cues to increase left step length to help facilitate right weight shift. On parts of gait path mirror available and visual cues in mirror for upright posture.    Ambulation Distance (Feet)  345 Feet   70 x 1   Assistive device  Rolling walker   right AFO, right hand grip, danceskin on right shoe   Gait Pattern  Step-to pattern;Step-through pattern;Decreased stance time - right;Decreased hip/knee flexion - right;Decreased weight shift to right;Poor foot clearance - right;Lateral trunk lean to left    Ambulation Surface  Level;Indoor      Neuro Re-ed    Neuro Re-ed Details   Powderboard in left sidelying for right hip flexion and extension x 10 with min assist to get through more extension range, then right knee flexion/ext 5 x 2 with PT  providing muscle tapping to try to facilitate contraction min assist with flexion and patient having to pull in some hip flexion to help flex. Supine hip abd right x 10 with towel under leg and min assist of PT to keep foot straight and gently lift leg through partial range. Standing at walker with right hand grip attachment with weight shifting over right leg. 1 PT CGA at right knee and helping to position foot and other PT in front of patient having her work on upright posture matching PT stance with tactile cues at hips to weight shift. Peformed x 10 with more upright posture when copying therapist in front.  Stepping forward and back with left leg 5 x 2 with continued assist as prior activity to try to facilitate more right weight shift. Standing activities performed at walker with right hand grip attachement.             PT Education - 10/26/19 1902    Education Details  Gave written instructions for prior HEP.    Person(s) Educated  Patient;Spouse    Methods  Explanation    Comprehension  Verbalized understanding       PT Short Term Goals - 10/26/19 1905      PT SHORT TERM GOAL #1   Title  Pt will be able to perform initial HEP with assist of husband for improved function.    Baseline  Husband reports they have been working on exercises.    Time  4    Period  Weeks    Status  Achieved    Target Date  09/25/19      PT SHORT TERM GOAL #2   Title  Pt will transfer w/c to/from mat with slideboard CGA with assist to set up slideboard for improved mobility.    Baseline  performing squat pivots and stand pivots    Time  4    Period  Weeks    Status  Achieved    Target Date  09/25/19      PT SHORT TERM GOAL #3   Title  Pt will be able to stand x 2 min mod assist with PT blocking right knee for improved standing balance.    Time  4    Period  Weeks    Status  Achieved    Target Date  09/25/19  PT SHORT TERM GOAL #4   Title  Pt will perform stand/pivot transfer mod assist  of husband to allow improved safety with transfers at home.    Time  4    Period  Weeks    Status  Achieved    Target Date  09/25/19      PT SHORT TERM GOAL #5   Title  Pt will perform all bed mobility min assist or less for improved mobility.    Time  4    Period  Weeks    Status  Achieved    Target Date  09/25/19      PT SHORT TERM GOAL #6   Title  Pt will propel w/c  100' supervision for improved mobility in home.    Time  4    Period  Weeks    Status  Achieved    Target Date  09/25/19        PT Long Term Goals - 08/26/19 0841      PT LONG TERM GOAL #1   Title  Pt will be able to perform progressive HEP for strengthening, balance and mobility with assist of husband to continued gains at home.    Time  10    Period  Weeks    Status  New    Target Date  11/04/19      PT LONG TERM GOAL #2   Title  Pt will be able to perform all transfers min assist for improved safety with mobility.    Time  10    Period  Weeks    Status  New    Target Date  11/04/19      PT LONG TERM GOAL #3   Title  Pt will be able to ambulate 10' with max assist for improved access to bathroom at home with husband with appropriate AD.    Time  10    Period  Weeks    Status  New    Target Date  11/04/19            Plan - 10/26/19 1903    Clinical Impression Statement  Pt responded well to visual cues with therapist in front for weight shifting. She demonstrated improved left step length today increased right stance time. Also increased gait distance.    Personal Factors and Comorbidities  Comorbidity 3+    Comorbidities  anxiety, migraines, HTN    Examination-Activity Limitations  Bathing;Bed Mobility;Stand;Locomotion Level;Squat;Stairs;Dressing;Transfers    Examination-Participation Restrictions  Community Activity;Driving;Shop;Laundry;Meal Prep    Stability/Clinical Decision Making  Evolving/Moderate complexity    Rehab Potential  Fair    PT Frequency  2x / week    PT Duration  Other  (comment)   10 weeks   PT Treatment/Interventions  ADLs/Self Care Home Management;Electrical Stimulation;DME Instruction;Gait training;Stair training;Functional mobility training;Therapeutic activities;Therapeutic exercise;Orthotic Fit/Training;Patient/family education;Neuromuscular re-education;Balance training;Manual techniques;Passive range of motion;Vestibular;Wheelchair mobility training    PT Next Visit Plan  Can we add visit for next Wed? Recert due by 4/7. Perform early next visit? Assess vitals prior to therapy.  Danceskin to front of right shoe to help slide. Seated weight shifting to L, sit > stand on Steady reaching up and to the L.  Mirror therapy .  Use of powderboard, supine exercises (possibly add these to HEP); sit<>stand with block under L foot to shift weight to R, Increasing R stance time for increased swing with LLE    Consulted and Agree with Plan of Care  Patient;Family member/caregiver  Family Member Consulted  husband       Patient will benefit from skilled therapeutic intervention in order to improve the following deficits and impairments:  Abnormal gait, Decreased activity tolerance, Decreased balance, Decreased cognition, Decreased knowledge of use of DME, Decreased mobility, Decreased range of motion, Decreased safety awareness, Decreased strength, Impaired sensation  Visit Diagnosis: Muscle weakness (generalized)  Other abnormalities of gait and mobility     Problem List Patient Active Problem List   Diagnosis Date Noted  . Nontraumatic subcortical hemorrhage of left cerebral hemisphere ([place]) 08/27/2019  . Acute blood loss anemia   . Hypoalbuminemia due to protein-calorie malnutrition ([place])   . Thrombocytopenia ([place])   . Dysphagia 07/29/2019  . Infarction of left basal ganglia ([place]) 07/18/2019  . Hypernatremia   . Leukocytosis   . Essential hypertension   . Global aphasia   . Cytotoxic brain edema ([place]) 07/10/2019  . ICH (intracerebral hemorrhage)  ([place]) 07/09/2019  . Anxiety state 02/21/2015  . Cephalalgia 12/21/2014    Electa Sniff, PT, DPT, NCS 10/26/2019, 7:06 PM  [facility] [address] [place], [place], [address] Phone: [phone]   Fax:  [phone]  Name: Melanie Madden MRN: [id number] Date of Birth: 1966/01/20

## 2019-10-28 ENCOUNTER — Other Ambulatory Visit: Payer: Self-pay

## 2019-10-28 ENCOUNTER — Ambulatory Visit: Payer: Managed Care, Other (non HMO)

## 2019-10-28 ENCOUNTER — Ambulatory Visit: Payer: Managed Care, Other (non HMO) | Admitting: Occupational Therapy

## 2019-10-28 DIAGNOSIS — R4701 Aphasia: Secondary | ICD-10-CM

## 2019-10-28 DIAGNOSIS — R1310 Dysphagia, unspecified: Secondary | ICD-10-CM

## 2019-10-28 DIAGNOSIS — R41841 Cognitive communication deficit: Secondary | ICD-10-CM

## 2019-10-28 DIAGNOSIS — M6281 Muscle weakness (generalized): Secondary | ICD-10-CM

## 2019-10-28 DIAGNOSIS — R482 Apraxia: Secondary | ICD-10-CM

## 2019-10-28 DIAGNOSIS — R2689 Other abnormalities of gait and mobility: Secondary | ICD-10-CM

## 2019-10-28 DIAGNOSIS — I69153 Hemiplegia and hemiparesis following nontraumatic intracerebral hemorrhage affecting right non-dominant side: Secondary | ICD-10-CM

## 2019-10-28 DIAGNOSIS — I69151 Hemiplegia and hemiparesis following nontraumatic intracerebral hemorrhage affecting right dominant side: Secondary | ICD-10-CM

## 2019-10-28 NOTE — Patient Instructions (Signed)
   Keep practicing with [name]!

## 2019-10-28 NOTE — Therapy (Signed)
[facility] [address] [place], [place], [address] Phone: [phone]   Fax:  [phone]  Occupational Therapy Treatment  Patient Details  Name: Melanie Madden MRN: [id number] Date of Birth: [birth year] Referring Provider (OT): Dr. Alysia Penna   Encounter Date: 10/28/2019  OT End of Session - 10/28/19 1340    Visit Number  16    Number of Visits  25    Date for OT Re-Evaluation  12/01/19    Authorization Type  [employer] 2021:  No prior auth OT, 60 DAY visit limit (regardless of how many therapies each day).    Authorization - Visit Number  19    Authorization - Number of Visits  34    OT Start Time  1150    OT Stop Time  1230    OT Time Calculation (min)  40 min    Activity Tolerance  Patient tolerated treatment well    Behavior During Therapy  Impulsive       Past Medical History:  Diagnosis Date  . Allergy   . Anxiety   . Arthritis   . High triglycerides   . Kidney stones   . Migraines   . Recurrent sinus infections     Past Surgical History:  Procedure Laterality Date  . ANTERIOR CRUCIATE LIGAMENT REPAIR Left   . BUNIONECTOMY Right   . LASIK    . LITHOTRIPSY    . MENISCUS REPAIR Left   . NASAL SEPTUM SURGERY    . SHOULDER SURGERY    . VARICOSE VEIN SURGERY Left     There were no vitals filed for this visit.  Subjective Assessment - 10/28/19 1340    Subjective   Pt denies pain    Patient is accompanied by:  Family member    Pertinent History  Subcortical hemorrhage of R cerbral hemisphere.  PMH:  HTN, anxiety, migraines, hx of shoulder injection approx 2 weeks ago for R shoulder pain, hx of R shoulder surgery (arthroscopic)    Limitations  aphasia, fall risk, impulsive (husband reports hx of OCD)    Special Tests  PER HUSBAND for sit>stand:  pt responds to "big push" with knee blocked on both side    Patient Stated Goals  husband reports: to be able to care for herself more (pt indicates that  she onces to return to previous activities per questioning/gestures)    Currently in Pain?  No/denies           Seated at table NMES to wrist and finger extensors 50 pps, 250 pw, 10 secs cycle intensity 24, x 10 mins while pt perfomed AA/ROM/P/ROM shoulder flexion along table top with towel, mod / max facilitation for shoulder flexion. Mild pink coloration  at electrode sites. Seated edge of mat weight bearing through RUE with body on arm movements, mod-max facilitation, v.c.  NMES applied to triceps x 15 mins with same parameters, intensity 24-30, with pt performing low range AA/ROM shoulder flexion with UE ranger and sliding pool noodle along thighs with bilateral UE's, max facilitation due to pt distractibility today. (Pt was externally distracted by other pt's in gym. Pt's husband reports pt had to get up early today to get to therapy). Pink coloration at electrode sites, lotion applied. Pt's husband plans to use estim at home on triceps during pt. tableslides, he photographed electrode placement.                 OT Short Term Goals - 10/12/19 1216  OT SHORT TERM GOAL #1   Title  Pt/husband will be independent with initial HEP for RUE ROM--check STGs 10/14/19    Time  6    Period  Weeks    Status  Achieved      OT SHORT TERM GOAL #2   Title  Pt will perform UB dressing with min A.    Time  6    Period  Weeks    Status  On-going   MOD ASSIST     OT SHORT TERM GOAL #3   Title  Pt will perform UB bathing with mod A.    Time  6    Period  Weeks    Status  Achieved      OT SHORT TERM GOAL #4   Title  Pt will be able to brush hair with set-up.    Time  6    Period  Weeks    Status  On-going   fluctuating assist needed     OT SHORT TERM GOAL #5   Title  Pt will demo at least 110* shoulder flex PROM in supine without pain for incr ease for ADLs.    Time  6    Period  Weeks    Status  Achieved        OT Long Term Goals - 09/02/19 1229      OT LONG  TERM GOAL #1   Title  Pt/husband will be independent with HEP for functional activities for incr participation in IADLs/leisure tasks and for cognition (and incorporating RUE if able).--check LTGs 12/01/19    Time  12    Period  Weeks    Status  New      OT LONG TERM GOAL #2   Title  Pt will perform UB dressing with supervision.    Time  12    Period  Weeks    Status  New      OT LONG TERM GOAL #3   Title  Pt will perform LB dressing with mod A.    Time  12    Period  Weeks    Status  New      OT LONG TERM GOAL #4   Title  Pt will perform UB bathing with min A.    Time  12    Period  Weeks    Status  New      OT LONG TERM GOAL #5   Title  Pt will perform simple cold snack prep from w/c level with min A/set up.    Time  12    Period  Weeks    Status  New      OT LONG TERM GOAL #6   Title  Pt will perform toileting with mod A.    Time  12    Period  Weeks    Status  New            Plan - 10/28/19 1341    Clinical Impression Statement  Pt is progressing towards goals however pt was very distracted today with difficulty maintaining focus on her therapy.    Occupational performance deficits (Please refer to evaluation for details):  ADL's;IADL's;Leisure;Work;Social Participation    Body Structure / Function / Physical Skills  ADL;ROM;IADL;Sensation;Mobility;Strength;Tone;UE functional use;Decreased knowledge of precautions;Decreased knowledge of use of DME;Coordination;Balance;FMC;GMC    Cognitive Skills  Attention;Temperament/Personality;Understand;Thought;Safety Awareness    Rehab Potential  Good    OT Frequency  2x / week    OT Duration  12 weeks    OT Treatment/Interventions  Self-care/ADL training;Moist Heat;DME and/or AE instruction;Splinting;Therapeutic activities;Aquatic Therapy;Cognitive remediation/compensation;Therapeutic exercise;Cryotherapy;Neuromuscular education;Functional Mobility Training;Passive range of motion;Visual/perceptual  remediation/compensation;Patient/family education;Manual Therapy;Electrical Stimulation;Fluidtherapy    Plan  continue to address ADLs, neuro re-ed including use of tilted stool and wt. bearing    Consulted and Agree with Plan of Care  Patient;Family member/caregiver    Family Member Consulted  husband       Patient will benefit from skilled therapeutic intervention in order to improve the following deficits and impairments:   Body Structure / Function / Physical Skills: ADL, ROM, IADL, Sensation, Mobility, Strength, Tone, UE functional use, Decreased knowledge of precautions, Decreased knowledge of use of DME, Coordination, Balance, FMC, GMC Cognitive Skills: Attention, Temperament/Personality, Understand, Thought, Safety Awareness     Visit Diagnosis: Hemiplegia and hemiparesis following nontraumatic intracerebral hemorrhage affecting right dominant side (HCC)  Muscle weakness (generalized)  Other abnormalities of gait and mobility  Hemiplegia and hemiparesis following nontraumatic intracerebral hemorrhage affecting right non-dominant side (HCC)    Problem List Patient Active Problem List   Diagnosis Date Noted  . Nontraumatic subcortical hemorrhage of left cerebral hemisphere ([facility]) 08/27/2019  . Acute blood loss anemia   . Hypoalbuminemia due to protein-calorie malnutrition ([facility])   . Thrombocytopenia ([facility])   . Dysphagia 07/29/2019  . Infarction of left basal ganglia ([facility]) 07/18/2019  . Hypernatremia   . Leukocytosis   . Essential hypertension   . Global aphasia   . Cytotoxic brain edema ([facility]) 07/10/2019  . ICH (intracerebral hemorrhage) ([facility]) 07/09/2019  . Anxiety state 02/21/2015  . Cephalalgia 12/21/2014    Theo Krumholz 10/28/2019, 1:42 PM  [facility] [address] [place], [place], [address] Phone: [phone]   Fax:  [phone]  Name: Islee Yenter MRN: [id number] Date of Birth: October 15, 1965

## 2019-10-28 NOTE — Therapy (Signed)
[facility] [address] [place], [place], [address] Phone: [phone]   Fax:  [phone]  Speech Language Pathology Treatment  Patient Details  Name: Melanie Madden MRN: [id number] Date of Birth: [birth year] Referring Provider (SLP): Alysia Penna, MD   Encounter Date: 10/28/2019  End of Session - 10/28/19 1130    Visit Number  17    Number of Visits  25    Date for SLP Re-Evaluation  12/04/19    Authorization Type  cigna    Authorization Time Period  60 DAYS of therapy regardless of number disciplines per day    SLP Start Time  1018    SLP Stop Time   1100    SLP Time Calculation (min)  42 min    Activity Tolerance  Patient tolerated treatment well       Past Medical History:  Diagnosis Date  . Allergy   . Anxiety   . Arthritis   . High triglycerides   . Kidney stones   . Migraines   . Recurrent sinus infections     Past Surgical History:  Procedure Laterality Date  . ANTERIOR CRUCIATE LIGAMENT REPAIR Left   . BUNIONECTOMY Right   . LASIK    . LITHOTRIPSY    . MENISCUS REPAIR Left   . NASAL SEPTUM SURGERY    . SHOULDER SURGERY    . VARICOSE VEIN SURGERY Left     There were no vitals filed for this visit.  Subjective Assessment - 10/28/19 1025    Subjective  "ME" "NUTS" "SHAKE" "LEG" yesterday, per husband, with assistance.    Patient is accompained by:  Family member   terry   Currently in Pain?  No/denies            ADULT SLP TREATMENT - 10/28/19 1026      General Information   Behavior/Cognition  Alert;Cooperative;Doesn't follow directions;Requires cueing;Impulsive;Agitated      Treatment Provided   Treatment provided  Cognitive-Linquistic      Cognitive-Linquistic Treatment   Treatment focused on  Aphasia;Apraxia    Skilled Treatment  Given [name]'s "s" remark, SLP worked with pt on two sound combinations today with /m/ /h/ and /i/. Pt able to make separate /m/ and /i /  phonemes but never put them together into /mi/. SLP also attempted via language means with opposites and other simple semantic cues without success. One of pt's errors was /nid/ ("need"), so SLP shaped this error into a carrier phrase "I need" with pt 90% success in imitation, and 80% success with SLP initial phonemic cue "I" with pt "I need". SLP then attempted to have pt functionaly verbalize common items (1 or 2 syllable) with the written "I need" phrase and a picture underneath in hopes of triggering linguistic pathways. Pt with functional imitation of 1/6 items but "I need" was definitive with SLP "I" initial cue 85% of the time. In a number of ways, SLP told pt to keep practicing with husband at home becuase it is helping.      Assessment / Recommendations / Plan   Plan  Continue with current plan of care      Progression Toward Goals   Progression toward goals  Progressing toward goals       SLP Education - 10/28/19 1130    Education Details  needs to cont to practice with husband at home    Person(s) Educated  Patient    Methods  Explanation    Comprehension  Verbalized understanding;Need further instruction       SLP Short Term Goals - 10/07/19 1239      SLP SHORT TERM GOAL #1   Title  pt will simultaneously with SLP produce 3 different phonemes 50% of the time over three sessions    Status  Partially Met      SLP SHORT TERM GOAL #2   Title  pt will ID objects salient to pt in f:4 80% of the time over 3 sessions    Status  Deferred      SLP SHORT TERM GOAL #3   Title  pt will write her full name on first attempt over three sessions    Time  1    Status  Deferred      SLP SHORT TERM GOAL #4   Title  pt will attempt expressive communication via multimodal means during 5 sessions    Status  Not Met      SLP SHORT TERM GOAL #5   Title  pt will demonstrate undertanding of simple 1-step functional commands with 70% accuracy with multimodal cues over 3 sessions    Status  Not  Met      SLP SHORT TERM GOAL #6   Title  pt will look at object or line drawing and match the correct word from f:6 words 90% success over 3 sessions    Status  Deferred       SLP Long Term Goals - 10/28/19 1132      SLP LONG TERM GOAL #1   Title  pt's diet will be appropriately upgraded as needed by SLP    Time  4    Period  Weeks   or 25 total visits, for all LTGs   Status  On-going      SLP LONG TERM GOAL #2   Title  pt will produce 3 simple words/words pertinent to pt (family or pet names, etc) simultaneously with SLP and multimodal cues over three sessions    Time  4    Period  Weeks    Status  Revised      SLP LONG TERM GOAL #3   Title  pt will demo understanding of simple yes/no questions pertinent to desires/wants with multimodal cues 90% of the time over 3 sessions    Time  4    Period  Weeks    Status  On-going      SLP LONG TERM GOAL #4   Title  a device trial with a speech generating device will be initiated within the first 20 ST visits    Time  4    Period  Weeks    Status  On-going      SLP LONG TERM GOAL #5   Title  pt will demo recognition of written word for a common object 100% success over 3 sessions    Time  4    Period  Weeks    Status  On-going      SLP LONG TERM GOAL #6   Title  pt will work on speech-related tasks at home between 75% of sessions as reported by family and/or noted on [facility] website    Baseline  10-21-19, 10-26-19, 10-28-19    Time  4    Period  Weeks    Status  On-going       Plan - 10/28/19 1130    Clinical Impression Statement  Cont with no complaints with dys III diet/thin, to SLP knowledge.  Pt WILL NEED TO CONT TO COMPLY with work at home for [place] to continue. Pt cont to demo cognitive communication deficits in at least the areas of attention (incr'd impulsivity) and awareness (unaware of verbal deficits -vs/in addition to receptive languge deficit). Pt cont to present with a severe/profound expressive aphasia  with verbal apraxia, and severe/profound receptive aphasia ("global aphasia"). Today pt was successful in repeating and also spontaneously producing carrier phrase "I need" with initial SLP "I" cue. See "skilled treatment" for more details of today's session. Pt did not complete entire WAB due to her incr'd frustration with expressive communication. At some point pt may benefit from a speech generating device. She would cont to benefit from skilled ST focusing on incr'ing communicative effectiveness via multimodal means and incr'ing functional communication (verbal or nonverbal), incr'ing cognition in functional ways as able given pt's language deficit, and monitoring/upgrading pt's diet as appropriate.Pt knows she will need to participate at home for therapy here to be of any assistance.    Speech Therapy Frequency  2x / week    Duration  --   8 weeks   Treatment/Interventions  Diet toleration management by SLP;Trials of upgraded texture/liquids;Language facilitation;Internal/external aids;Cognitive reorganization;Cueing hierarchy;SLP instruction and feedback;Patient/family education;Compensatory strategies;Multimodal communcation approach;Functional tasks;Environmental controls    Potential to Achieve Goals  Fair    Potential Considerations  Severity of impairments;Cooperation/participation level       Patient will benefit from skilled therapeutic intervention in order to improve the following deficits and impairments:   Aphasia  Verbal apraxia  Cognitive communication deficit  Dysphagia, unspecified type    Problem List Patient Active Problem List   Diagnosis Date Noted  . Nontraumatic subcortical hemorrhage of left cerebral hemisphere ([facility]) 08/27/2019  . Acute blood loss anemia   . Hypoalbuminemia due to protein-calorie malnutrition ([facility])   . Thrombocytopenia ([facility])   . Dysphagia 07/29/2019  . Infarction of left basal ganglia ([facility]) 07/18/2019  . Hypernatremia   . Leukocytosis   .  Essential hypertension   . Global aphasia   . Cytotoxic brain edema ([facility]) 07/10/2019  . ICH (intracerebral hemorrhage) ([facility]) 07/09/2019  . Anxiety state 02/21/2015  . Cephalalgia 12/21/2014    [facility] ,[place], [place]  10/28/2019, 11:33 AM  [facility] [address] [place], [place], [address] Phone: [phone]   Fax:  [phone]   Name: Melanie Madden MRN: [id number] Date of Birth: 05/22/1966

## 2019-10-28 NOTE — Therapy (Signed)
[facility] [address] [place], [place], [address] Phone: [phone]   Fax:  [phone]  Physical Therapy Treatment/ Recert  Patient Details  Name: Melanie Madden MRN: [id number] Date of Birth: 1966-02-27 Referring Provider (PT): Lauraine Rinne, [place]   Encounter Date: 10/28/2019  PT End of Session - 10/28/19 1151    Visit Number  17    Number of Visits  35    Date for PT Re-Evaluation  12/30/19    Authorization Type  [employer]- 60 VL combined PT/OT/ST - counts as 1 visit if seen on same day.  Not sure if 60 is hard max.  $50 copay each day - make sure PT/OT/ST on same day if possible    Authorization - Visit Number  --   PT/OT/ST days combined   Authorization - Number of Visits  60    PT Start Time  1147    PT Stop Time  1228    PT Time Calculation (min)  41 min    Equipment Utilized During Treatment  Gait belt   pt's R AFO; added forefront wedge   Activity Tolerance  Patient tolerated treatment well    Behavior During Therapy  Impulsive       Past Medical History:  Diagnosis Date  . Allergy   . Anxiety   . Arthritis   . High triglycerides   . Kidney stones   . Migraines   . Recurrent sinus infections     Past Surgical History:  Procedure Laterality Date  . ANTERIOR CRUCIATE LIGAMENT REPAIR Left   . BUNIONECTOMY Right   . LASIK    . LITHOTRIPSY    . MENISCUS REPAIR Left   . NASAL SEPTUM SURGERY    . SHOULDER SURGERY    . VARICOSE VEIN SURGERY Left     There were no vitals filed for this visit.  Subjective Assessment - 10/28/19 1149    Subjective  Pt reports that she is doing well. Denies any new issues. Husband is still trying to get someone in home that will assist Jen as she needs.    Patient is accompained by:  Family member    Pertinent History  anxiety, migraines, HTN    Patient Stated Goals  Per husband report they would like her to get stronger on right, improve mobility, and work on  speech.    Currently in Pain?  No/denies                       [facility] Adult PT Treatment/Exercise - 10/28/19 1151      Transfers   Transfers  Sit to Stand;Stand to Sit    Sit to Stand  4: Min guard    Sit to Stand Details  Verbal cues for technique;Tactile cues for weight shifting    Five time sit to stand comments   27 sec from mat with CGA of PT at right leg    Stand to Sit  4: Min guard    Stand to Sit Details (indicate cue type and reason)  Verbal cues for technique;Tactile cues for weight shifting    Stand to Sit Details  Pt continues to need assist to remove right hand from hand grip attachment as forgets about it.      Ambulation/Gait   Ambulation/Gait  Yes    Ambulation/Gait Assistance  4: Min assist    Ambulation/Gait Assistance Details  Min assist to help with placement of right foot and CGA  from pt's husband at trunk. Pt was given verbal cues to increase left step length and verbal and visual cues in mirror on straightaway to try to get more upright posture.    Ambulation Distance (Feet)  345 Feet    Assistive device  Rolling walker   right hand grip attachment   Gait Pattern  Step-to pattern;Step-through pattern;Decreased hip/knee flexion - right;Poor foot clearance - right    Ambulation Surface  Level;Indoor    Gait Comments  BP=148/90 after gait.      Neuro Re-ed    Neuro Re-ed Details   Standing in front of mirror for visual cues for weight shift and posture x 2 min with PT blocking right knee with cues to keep head up. Stepping forward and back with left foot with cues for more upright posture and to facilitate right weight shift x 10.             PT Education - 10/28/19 2008    Education Details  Discussed PT plan to recert    Person(s) Educated  Patient;Spouse    Methods  Explanation    Comprehension  Verbalized understanding       PT Short Term Goals - 10/26/19 1905      PT SHORT TERM GOAL #1   Title  Pt will be able to perform initial  HEP with assist of husband for improved function.    Baseline  Husband reports they have been working on exercises.    Time  4    Period  Weeks    Status  Achieved    Target Date  09/25/19      PT SHORT TERM GOAL #2   Title  Pt will transfer w/c to/from mat with slideboard CGA with assist to set up slideboard for improved mobility.    Baseline  performing squat pivots and stand pivots    Time  4    Period  Weeks    Status  Achieved    Target Date  09/25/19      PT SHORT TERM GOAL #3   Title  Pt will be able to stand x 2 min mod assist with PT blocking right knee for improved standing balance.    Time  4    Period  Weeks    Status  Achieved    Target Date  09/25/19      PT SHORT TERM GOAL #4   Title  Pt will perform stand/pivot transfer mod assist of husband to allow improved safety with transfers at home.    Time  4    Period  Weeks    Status  Achieved    Target Date  09/25/19      PT SHORT TERM GOAL #5   Title  Pt will perform all bed mobility min assist or less for improved mobility.    Time  4    Period  Weeks    Status  Achieved    Target Date  09/25/19      PT SHORT TERM GOAL #6   Title  Pt will propel w/c  100' supervision for improved mobility in home.    Time  4    Period  Weeks    Status  Achieved    Target Date  09/25/19        PT Long Term Goals - 10/28/19 2013      PT LONG TERM GOAL #1   Title  Pt will be able to perform progressive HEP  for strengthening, balance and mobility with assist of husband to continued gains at home.    Baseline  PT continues to add to HEP    Time  10    Period  Weeks    Status  On-going      PT LONG TERM GOAL #2   Title  Pt will be able to perform all transfers min assist for improved safety with mobility.    Baseline  Pt is min assist with sit to stand and stand/pivot transfers.    Time  10    Period  Weeks    Status  Achieved      PT LONG TERM GOAL #3   Title  Pt will be able to ambulate 10' with max assist for  improved access to bathroom at home with husband with appropriate AD.    Baseline  Pt is able to ambulate 345' min assist right foot and CGA of 2nd person.    Time  10    Period  Weeks    Status  Achieved      Updated PT goals:   PT Short Term Goals - 10/28/19 2028      PT SHORT TERM GOAL #1   Title  Pt will be able to perform all transfers CGA for improved mobility.    Time  4    Period  Weeks    Status  New    Target Date  12/02/19      PT SHORT TERM GOAL #2   Title  Pt will be able to maintain standing with 1 UE support x 5 min to assist with standing ADLs supervision.    Time  4    Period  Weeks    Status  New    Target Date  12/02/19      PT SHORT TERM GOAL #3   Title  Pt will ambulate >350' with min assist of 1 person with RW with right hand grip attachment so that husband can safely ambulate with her at home.    Time  4    Period  Weeks    Status  New    Target Date  12/02/19      PT Long Term Goals - 10/28/19 2031      PT LONG TERM GOAL #1   Title  Pt will be able to perform progressive HEP for strengthening, balance and mobility with assist of husband to continued gains at home.    Time  8    Period  Weeks    Status  On-going    Target Date  12/30/19      PT LONG TERM GOAL #2   Title  Pt will decrease 5 x sit to stand from 27 sec to <22 sec from edge of mat for improved balance and functional mobility.    Time  8    Period  Weeks    Status  New    Target Date  12/30/19      PT LONG TERM GOAL #3   Title  Pt will ambulate >400' CGA with RW with right hand grip attachment for improved gait ability in home.    Time  8    Period  Weeks    Status  New    Target Date  12/30/19            Plan - 10/28/19 2015    Clinical Impression Statement  PT reassessed LTGs today. Pt has met all goals at this  time. She has shown significant improvement in transfers now CGA/min assist. She is now able to ambulate up to 48' with min assist with 2nd person mostly for  safety with RW and right hand grip attachment. Pt is showing more ability to initiate movement in right LE with min assist from PT for placement and advancement at times. PT continues to focus on improving right weight shift with gait. Pt remains impulsive and needs around the clock supervision still due to this and safety concerns. Husband has been providing this. She is, however, starting to slow down more with tasks with better focus. PT was able to assess 5 x sit to stand today with score of 27sec. She is fall risk based on this. Pt will benefit from continued PT to continue to work on strength, balance and functional mobility progression to maximize safety and mobility in home.    Personal Factors and Comorbidities  Comorbidity 3+    Comorbidities  anxiety, migraines, HTN    Examination-Activity Limitations  Bathing;Bed Mobility;Stand;Locomotion Level;Squat;Stairs;Dressing;Transfers    Examination-Participation Restrictions  Community Activity;Driving;Shop;Laundry;Meal Prep    Stability/Clinical Decision Making  Evolving/Moderate complexity    Rehab Potential  Fair    PT Frequency  2x / week    PT Duration  8 weeks    PT Treatment/Interventions  ADLs/Self Care Home Management;Electrical Stimulation;DME Instruction;Gait training;Stair training;Functional mobility training;Therapeutic activities;Therapeutic exercise;Orthotic Fit/Training;Patient/family education;Neuromuscular re-education;Balance training;Manual techniques;Passive range of motion;Vestibular;Wheelchair mobility training    PT Next Visit Plan  PT did recert early at this visit.  Assess vitals prior to therapy.  [place] to front of right shoe to help slide (above [employer]). Continue with activities in standing to facilitate increased right weight shift. Pt has been responding better to mirror as well as mimicking therapist position in front. Gait with RW with right hand grip attachment. Let husband know that we have ordered the  attachment and will be able to give to them when comes in.    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 Nontraumatic subcortical hemorrhage of left cerebral hemisphere ([place]) 08/27/2019  . Acute blood loss anemia   . Hypoalbuminemia due to protein-calorie malnutrition ([place])   . Thrombocytopenia ([place])   . Dysphagia 07/29/2019  . Infarction of left basal ganglia ([place]) 07/18/2019  . Hypernatremia   . Leukocytosis   . Essential hypertension   . Global aphasia   . Cytotoxic brain edema ([place]) 07/10/2019  . ICH (intracerebral hemorrhage) ([place]) 07/09/2019  . Anxiety state 02/21/2015  . Cephalalgia 12/21/2014    Electa Sniff, PT, DPT, NCS 10/28/2019, 8:27 PM  [facility] [address] [place], [place], [address] Phone: [phone]   Fax:  [phone]  Name: Khamora Karan MRN: [id number] Date of Birth: [birth year]

## 2019-11-02 ENCOUNTER — Ambulatory Visit: Payer: Managed Care, Other (non HMO) | Admitting: Physical Therapy

## 2019-11-02 ENCOUNTER — Other Ambulatory Visit: Payer: Self-pay

## 2019-11-02 ENCOUNTER — Ambulatory Visit: Payer: Managed Care, Other (non HMO) | Admitting: Occupational Therapy

## 2019-11-02 ENCOUNTER — Ambulatory Visit: Payer: Managed Care, Other (non HMO) | Attending: Physical Medicine and Rehabilitation

## 2019-11-02 VITALS — BP 113/90 | HR 85

## 2019-11-02 DIAGNOSIS — R208 Other disturbances of skin sensation: Secondary | ICD-10-CM

## 2019-11-02 DIAGNOSIS — R2689 Other abnormalities of gait and mobility: Secondary | ICD-10-CM | POA: Diagnosis present

## 2019-11-02 DIAGNOSIS — R1310 Dysphagia, unspecified: Secondary | ICD-10-CM | POA: Diagnosis present

## 2019-11-02 DIAGNOSIS — I69219 Unspecified symptoms and signs involving cognitive functions following other nontraumatic intracranial hemorrhage: Secondary | ICD-10-CM | POA: Diagnosis present

## 2019-11-02 DIAGNOSIS — M6281 Muscle weakness (generalized): Secondary | ICD-10-CM | POA: Insufficient documentation

## 2019-11-02 DIAGNOSIS — R482 Apraxia: Secondary | ICD-10-CM | POA: Diagnosis present

## 2019-11-02 DIAGNOSIS — R4701 Aphasia: Secondary | ICD-10-CM

## 2019-11-02 DIAGNOSIS — R41841 Cognitive communication deficit: Secondary | ICD-10-CM | POA: Diagnosis present

## 2019-11-02 DIAGNOSIS — I69151 Hemiplegia and hemiparesis following nontraumatic intracerebral hemorrhage affecting right dominant side: Secondary | ICD-10-CM | POA: Diagnosis present

## 2019-11-02 DIAGNOSIS — R209 Unspecified disturbances of skin sensation: Secondary | ICD-10-CM | POA: Diagnosis present

## 2019-11-02 NOTE — Therapy (Signed)
[facility] [address] [place], [place], [address] Phone: [phone]   Fax:  [phone]  Occupational Therapy Treatment  Patient Details  Name: Melanie Madden MRN: [id number] Date of Birth: [birth year] Referring Provider (OT): Dr. Alysia Penna   Encounter Date: 11/02/2019  OT End of Session - 11/02/19 1211    Visit Number  17    Number of Visits  25    Date for OT Re-Evaluation  12/01/19    Authorization Type  [employer] 2021:  No prior auth OT, 60 DAY visit limit (regardless of how many therapies each day).    Authorization - Visit Number  20    Authorization - Number of Visits  11    OT Start Time  1100    OT Stop Time  1145    OT Time Calculation (min)  45 min    Activity Tolerance  Patient tolerated treatment well    Behavior During Therapy  Impulsive       Past Medical History:  Diagnosis Date  . Allergy   . Anxiety   . Arthritis   . High triglycerides   . Kidney stones   . Migraines   . Recurrent sinus infections     Past Surgical History:  Procedure Laterality Date  . ANTERIOR CRUCIATE LIGAMENT REPAIR Left   . BUNIONECTOMY Right   . LASIK    . LITHOTRIPSY    . MENISCUS REPAIR Left   . NASAL SEPTUM SURGERY    . SHOULDER SURGERY    . VARICOSE VEIN SURGERY Left     There were no vitals filed for this visit.  Subjective Assessment - 11/02/19 1104    Pertinent History  Subcortical hemorrhage of R cerbral hemisphere.  PMH:  HTN, anxiety, migraines, hx of shoulder injection approx 2 weeks ago for R shoulder pain, hx of R shoulder surgery (arthroscopic)    Limitations  aphasia, fall risk, impulsive (husband reports hx of OCD)    Patient Stated Goals  husband reports: to be able to care for herself more (pt indicates that she onces to return to previous activities per questioning/gestures)    Currently in Pain?  No/denies       Seated: tilted stool activity for closed chain arm on body movements w/  mod to max facilitation required. Pt able to facilitate some sh extension and scapula retraction w/ upward tilt of stool. Pt also showing some minimal activation w/ sh flexion during downward tilt.  Wt bearing over RUE while performing LUE contralateral and ipsilateral reaching and trunk rotation w/ mod tactile cues/facilitation to push from RUE.  Closed chain activation against therapist's shoulder w/ mod assist, followed by BUE closed chain AA/ROM in low range sh flex w/ max assist RUE.                       OT Short Term Goals - 10/12/19 1216      OT SHORT TERM GOAL #1   Title  Pt/husband will be independent with initial HEP for RUE ROM--check STGs 10/14/19    Time  6    Period  Weeks    Status  Achieved      OT SHORT TERM GOAL #2   Title  Pt will perform UB dressing with min A.    Time  6    Period  Weeks    Status  On-going   MOD ASSIST     OT SHORT TERM GOAL #3  Title  Pt will perform UB bathing with mod A.    Time  6    Period  Weeks    Status  Achieved      OT SHORT TERM GOAL #4   Title  Pt will be able to brush hair with set-up.    Time  6    Period  Weeks    Status  On-going   fluctuating assist needed     OT SHORT TERM GOAL #5   Title  Pt will demo at least 110* shoulder flex PROM in supine without pain for incr ease for ADLs.    Time  6    Period  Weeks    Status  Achieved        OT Long Term Goals - 09/02/19 1229      OT LONG TERM GOAL #1   Title  Pt/husband will be independent with HEP for functional activities for incr participation in IADLs/leisure tasks and for cognition (and incorporating RUE if able).--check LTGs 12/01/19    Time  12    Period  Weeks    Status  New      OT LONG TERM GOAL #2   Title  Pt will perform UB dressing with supervision.    Time  12    Period  Weeks    Status  New      OT LONG TERM GOAL #3   Title  Pt will perform LB dressing with mod A.    Time  12    Period  Weeks    Status  New      OT LONG  TERM GOAL #4   Title  Pt will perform UB bathing with min A.    Time  12    Period  Weeks    Status  New      OT LONG TERM GOAL #5   Title  Pt will perform simple cold snack prep from w/c level with min A/set up.    Time  12    Period  Weeks    Status  New      OT LONG TERM GOAL #6   Title  Pt will perform toileting with mod A.    Time  12    Period  Weeks    Status  New            Plan - 11/02/19 1212    Clinical Impression Statement  Pt with improvements in RUE activation in closed chain activities.    Occupational performance deficits (Please refer to evaluation for details):  ADL's;IADL's;Leisure;Work;Social Participation    Body Structure / Function / Physical Skills  ADL;ROM;IADL;Sensation;Mobility;Strength;Tone;UE functional use;Decreased knowledge of precautions;Decreased knowledge of use of DME;Coordination;Balance;FMC;GMC    Cognitive Skills  Attention;Temperament/Personality;Understand;Thought;Safety Awareness    OT Frequency  2x / week    OT Duration  12 weeks    OT Treatment/Interventions  Self-care/ADL training;Moist Heat;DME and/or AE instruction;Splinting;Therapeutic activities;Aquatic Therapy;Cognitive remediation/compensation;Therapeutic exercise;Cryotherapy;Neuromuscular education;Functional Mobility Training;Passive range of motion;Visual/perceptual remediation/compensation;Patient/family education;Manual Therapy;Electrical Stimulation;Fluidtherapy    Plan  continue to address ADLs, neuro re-ed including use of tilted stool and wt. 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 Nontraumatic subcortical hemorrhage of left cerebral hemisphere ([place]) 08/27/2019  . Acute blood loss anemia   . Hypoalbuminemia due to protein-calorie malnutrition ([place])   . Thrombocytopenia ([place])   . Dysphagia 07/29/2019  . Infarction of left basal ganglia ([place]) 07/18/2019  . Hypernatremia   . Leukocytosis   . Essential hypertension   . Global aphasia   . Cytotoxic brain edema ([place]) 07/10/2019  . ICH (intracerebral hemorrhage) ([place]) 07/09/2019  . Anxiety state 02/21/2015  . Cephalalgia 12/21/2014    Carey Bullocks, OTR/L 11/02/2019, 12:13 PM  [facility] [address] [place], [place], [address] Phone: [phone]   Fax:  [phone]  Name: Betsi Fugua MRN: [id number] Date of Birth: 04/11/1966

## 2019-11-02 NOTE — Therapy (Signed)
[facility] [address] [place], [place], [address] Phone: [phone]   Fax:  [phone]  Physical Therapy Treatment  Patient Details  Name: Melanie Madden MRN: [id number] Date of Birth: 05-May-1966 Referring Provider (PT): Lauraine Rinne, [place]   Encounter Date: 11/02/2019  PT End of Session - 11/02/19 1553    Visit Number  18    Number of Visits  35    Date for PT Re-Evaluation  12/30/19    Authorization Type  [employer]- 60 VL combined PT/OT/ST - counts as 1 visit if seen on same day.  Not sure if 60 is hard max.  $50 copay each day - make sure PT/OT/ST on same day if possible    Authorization - Visit Number  --   PT/OT/ST days combined   Authorization - Number of Visits  60    PT Start Time  1021    PT Stop Time  1101    PT Time Calculation (min)  40 min    Equipment Utilized During Treatment  Gait belt   pt's R AFO; dance shoe cover to aid in foot clearance   Activity Tolerance  Patient tolerated treatment well    Behavior During Therapy  Impulsive       Past Medical History:  Diagnosis Date  . Allergy   . Anxiety   . Arthritis   . High triglycerides   . Kidney stones   . Migraines   . Recurrent sinus infections     Past Surgical History:  Procedure Laterality Date  . ANTERIOR CRUCIATE LIGAMENT REPAIR Left   . BUNIONECTOMY Right   . LASIK    . LITHOTRIPSY    . MENISCUS REPAIR Left   . NASAL SEPTUM SURGERY    . SHOULDER SURGERY    . VARICOSE VEIN SURGERY Left     Vitals:   11/02/19 1025  BP: 113/90  Pulse: 85    Subjective Assessment - 11/02/19 1023    Subjective  No changes    Patient is accompained by:  Family member    Pertinent History  anxiety, migraines, HTN    Patient Stated Goals  Per husband report they would like her to get stronger on right, improve mobility, and work on speech.    Currently in Pain?  No/denies                       [facility] Adult PT Treatment/Exercise  - 11/02/19 0001      Transfers   Transfers  Sit to Stand;Stand to Sit    Sit to Stand  4: Min guard    Sit to Stand Details  Verbal cues for technique;Tactile cues for weight shifting    Stand to Sit  4: Min guard    Stand to Sit Details (indicate cue type and reason)  Verbal cues for technique;Tactile cues for weight shifting    Stand to Sit Details  Needs cues and assistance to remove R hand from handgrip on RW, as pt forgets about it.    Comments  Performed x 5 reps sit<>stand, use of mirror in front to assist with cues for weightshfiting upon standing.  Also performed sit<>stand 2 sets x 5 reps with L foot positioned on green oval foam, with minimal assistance, but improved RLE weightbearing upon standing.      Ambulation/Gait   Ambulation/Gait  Yes    Ambulation/Gait Assistance  3: Mod assist;4: Min assist    Ambulation/Gait Assistance  Details  Assistance provided for R foot advancement/clearance (to avoid circumduction), and assist at trunk for upright posture.  PT provides verbal cues to increase L step length; visual cues of mirror (from tech assistance) for improved gait pattern.      Ambulation Distance (Feet)  115 Feet   230   Assistive device  Rolling walker   R hand grip attachment, foot cover for R foot clearance   Gait Pattern  Step-to pattern;Step-through pattern;Decreased hip/knee flexion - right;Poor foot clearance - right;Right circumduction    Ambulation Surface  Level;Indoor      Neuro Re-ed    Neuro Re-ed Details   Standing in front of mirror for visual cues for weight shift and posture x 2 min with PT blocking right knee with cues to keep head up. Then performed stepping forward and back with left foot with cues for more upright posture and to facilitate right weight shift 2 sets x 5 reps.      Knee/Hip Exercises: Seated   Other Seated Knee/Hip Exercises  Seated assisted RLE heelslides for knee flexion and knee extension; PT assists, with pt minimally activating quads  last 1/3 of motion to extend R knee       With standing in front of mirror, with R knee blocked, PT facilitates trunk rotation through R hip, to assist with decreasing R pelvis in posterior position, x 8 reps, (pt maintains stands and turns slightly towards L)      PT Education - 11/02/19 1552    Education Details  Let patient and husband know that hand grip had been ordered    Person(s) Educated  Patient;Spouse    Methods  Explanation    Comprehension  Verbalized understanding       PT Short Term Goals - 10/28/19 2028      PT SHORT TERM GOAL #1   Title  Pt will be able to perform all transfers CGA for improved mobility.    Time  4    Period  Weeks    Status  New    Target Date  12/02/19      PT SHORT TERM GOAL #2   Title  Pt will be able to maintain standing with 1 UE support x 5 min to assist with standing ADLs supervision.    Time  4    Period  Weeks    Status  New    Target Date  12/02/19      PT SHORT TERM GOAL #3   Title  Pt will ambulate >350' with min assist of 1 person with RW with right hand grip attachment so that husband can safely ambulate with her at home.    Time  4    Period  Weeks    Status  New    Target Date  12/02/19        PT Long Term Goals - 10/28/19 2031      PT LONG TERM GOAL #1   Title  Pt will be able to perform progressive HEP for strengthening, balance and mobility with assist of husband to continued gains at home.    Time  8    Period  Weeks    Status  On-going    Target Date  12/30/19      PT LONG TERM GOAL #2   Title  Pt will decrease 5 x sit to stand from 27 sec to <22 sec from edge of mat for improved balance and functional mobility.  Time  8    Period  Weeks    Status  New    Target Date  12/30/19      PT LONG TERM GOAL #3   Title  Pt will ambulate >400' CGA with RW with right hand grip attachment for improved gait ability in home.    Time  8    Period  Weeks    Status  New    Target Date  12/30/19             Plan - 11/02/19 1554    Clinical Impression Statement  Continued transfer training, standing weigthshifting and gait training.  Pt conitnues to need R knee blocked for improved RLE stability in standing, still has increased L weigthshift.  She was able to improve this with sit>stand activity with LLE standing on green balance foam.   She will continue to benefit from skilled PT to address strength, NMR to RLE and gait training for improved overall mobility and safety.    Personal Factors and Comorbidities  Comorbidity 3+    Comorbidities  anxiety, migraines, HTN    Examination-Activity Limitations  Bathing;Bed Mobility;Stand;Locomotion Level;Squat;Stairs;Dressing;Transfers    Examination-Participation Restrictions  Community Activity;Driving;Shop;Laundry;Meal Prep    Stability/Clinical Decision Making  Evolving/Moderate complexity    Rehab Potential  Fair    PT Frequency  2x / week    PT Duration  8 weeks    PT Treatment/Interventions  ADLs/Self Care Home Management;Electrical Stimulation;DME Instruction;Gait training;Stair training;Functional mobility training;Therapeutic activities;Therapeutic exercise;Orthotic Fit/Training;Patient/family education;Neuromuscular re-education;Balance training;Manual techniques;Passive range of motion;Vestibular;Wheelchair mobility training    PT Next Visit Plan  Assess vitals prior to therapy.  [place] to front of right shoe to help slide (above [employer]). Continue with activities in standing to facilitate increased right weight shift. Pt has been responding better to mirror as well as mimicking therapist position in front. Gait with RW with right hand grip attachment. Let husband know that we have ordered the attachment and will be able to give to them when comes in.    Consulted and Agree with Plan of Care  Patient;Family member/caregiver    Family Member Consulted  husband       Patient will benefit from skilled therapeutic intervention in  order to improve the following deficits and impairments:  Abnormal gait, Decreased activity tolerance, Decreased balance, Decreased cognition, Decreased knowledge of use of DME, Decreased mobility, Decreased range of motion, Decreased safety awareness, Decreased strength, Impaired sensation  Visit Diagnosis: Other abnormalities of gait and mobility  Muscle weakness (generalized)     Problem List Patient Active Problem List   Diagnosis Date Noted  . Nontraumatic subcortical hemorrhage of left cerebral hemisphere ([place]) 08/27/2019  . Acute blood loss anemia   . Hypoalbuminemia due to protein-calorie malnutrition ([place])   . Thrombocytopenia ([place])   . Dysphagia 07/29/2019  . Infarction of left basal ganglia ([place]) 07/18/2019  . Hypernatremia   . Leukocytosis   . Essential hypertension   . Global aphasia   . Cytotoxic brain edema ([place]) 07/10/2019  . ICH (intracerebral hemorrhage) ([place]) 07/09/2019  . Anxiety state 02/21/2015  . Cephalalgia 12/21/2014    Ashla Murph W. 11/02/2019, 3:58 PM  Frazier Butt., PT   [facility] [address] [place], [place], [address] Phone: [phone]   Fax:  [phone]  Name: Melanie Madden MRN: [id number] Date of Birth: 12-04-1965

## 2019-11-02 NOTE — Patient Instructions (Signed)
Practice EVERY DAY at home.

## 2019-11-02 NOTE — Therapy (Signed)
[facility] [address] [place], [place], [address] Phone: [phone]   Fax:  [phone]  Speech Language Pathology Treatment  Patient Details  Name: Melanie Madden MRN: [id number] Date of Birth: [birth year] Referring Provider (SLP): Alysia Penna, MD   Encounter Date: 11/02/2019  End of Session - 11/02/19 1730    Visit Number  18    Number of Visits  25    Date for SLP Re-Evaluation  12/04/19    SLP Start Time  0938    SLP Stop Time   1019    SLP Time Calculation (min)  41 min    Activity Tolerance  Patient tolerated treatment well       Past Medical History:  Diagnosis Date  . Allergy   . Anxiety   . Arthritis   . High triglycerides   . Kidney stones   . Migraines   . Recurrent sinus infections     Past Surgical History:  Procedure Laterality Date  . ANTERIOR CRUCIATE LIGAMENT REPAIR Left   . BUNIONECTOMY Right   . LASIK    . LITHOTRIPSY    . MENISCUS REPAIR Left   . NASAL SEPTUM SURGERY    . SHOULDER SURGERY    . VARICOSE VEIN SURGERY Left     There were no vitals filed for this visit.  Subjective Assessment - 11/02/19 0942    Subjective  "She volunteered an hour of practice yesterday."    Currently in Pain?  No/denies            ADULT SLP TREATMENT - 11/02/19 0942      General Information   Behavior/Cognition  Alert;Cooperative;Doesn't follow directions;Requires cueing;Impulsive;Agitated      Treatment Provided   Treatment provided  Cognitive-Linquistic      Cognitive-Linquistic Treatment   Treatment focused on  Aphasia;Apraxia    Skilled Treatment  Husband contcted [employer] for device trial - [employer] said they would contact me. SLP reviewed/drilled pt with her signs today - 35% (5/14) spontaneously with min A (to assist pt understanding of situation in which she would use the sign). Phoneme practice - simultaneous 60% success overall with /h/ and /m/ (better production with  /h/ than /m/); spontaneously- pt with 30% /m/ and 60% /h/. SLP paired /m/ with /i/ immediately following and pt success incr'd to 20% by end of session with those two separate phonemes (approximating "me"). SLP worked with "I need" carrier phrase with consistent (nonverbal) visual cue for labial opening with 60% success.       Assessment / Recommendations / Plan   Plan  Continue with current plan of care      Progression Toward Goals   Progression toward goals  --   pt reportedly practiced only yesterday, over the weekend.        SLP Short Term Goals - 10/07/19 1239      SLP SHORT TERM GOAL #1   Title  pt will simultaneously with SLP produce 3 different phonemes 50% of the time over three sessions    Status  Partially Met      SLP SHORT TERM GOAL #2   Title  pt will ID objects salient to pt in f:4 80% of the time over 3 sessions    Status  Deferred      SLP SHORT TERM GOAL #3   Title  pt will write her full name on first attempt over three sessions    Time  1    Status  Deferred      SLP SHORT TERM GOAL #4   Title  pt will attempt expressive communication via multimodal means during 5 sessions    Status  Not Met      SLP SHORT TERM GOAL #5   Title  pt will demonstrate undertanding of simple 1-step functional commands with 70% accuracy with multimodal cues over 3 sessions    Status  Not Met      SLP SHORT TERM GOAL #6   Title  pt will look at object or line drawing and match the correct word from f:6 words 90% success over 3 sessions    Status  Deferred       SLP Long Term Goals - 11/02/19 1732      SLP LONG TERM GOAL #1   Title  pt's diet will be appropriately upgraded as needed by SLP    Time  3    Period  Weeks   or 25 total visits, for all LTGs   Status  On-going      SLP LONG TERM GOAL #2   Title  pt will produce 3 simple words/words pertinent to pt (family or pet names, etc) simultaneously with SLP and multimodal cues over three sessions    Time  3    Period   Weeks    Status  Revised      SLP LONG TERM GOAL #3   Title  pt will demo understanding of simple yes/no questions pertinent to desires/wants with multimodal cues 90% of the time over 3 sessions    Time  3    Period  Weeks    Status  On-going      SLP LONG TERM GOAL #4   Title  a device trial with a speech generating device will be initiated within the first 20 ST visits    Time  3    Period  Weeks    Status  On-going      SLP LONG TERM GOAL #5   Title  pt will demo recognition of written word for a common object 100% success over 3 sessions    Time  3    Period  Weeks    Status  On-going      SLP LONG TERM GOAL #6   Title  pt will work on speech-related tasks at home between 75% of sessions as reported by family and/or noted on [place] website    Baseline  10-21-19, 10-26-19, 10-28-19    Time  3    Period  Weeks    Status  On-going       Plan - 11/02/19 1730    Clinical Impression Statement  Cont with no complaints with dys III diet/thin, to SLP knowledge. Pt reportedly practiced yesterday when SLP asked husband, "were you guys able to practice at all?" Pt [place] with daily work at home for [place] to be most effective. Pt cont to demo cognitive communication deficits in at least the areas of attention (incr'd impulsivity) and awareness (unaware of verbal deficits -vs/in addition to receptive languge deficit). Pt cont to present with a severe/profound expressive aphasia with verbal apraxia, and severe/profound receptive aphasia ("global aphasia"). Today pt was successful in repeating and also spontaneously producing carrier phrase "I need" with initial SLP "I" cue. See "skilled treatment" for more details of today's session. Pt did not complete entire WAB due to her incr'd frustration with expressive communication. At some point pt may benefit from  a speech generating device. She would cont to benefit from skilled ST focusing on incr'ing communicative effectiveness  via multimodal means and incr'ing functional communication (verbal or nonverbal), incr'ing cognition in functional ways as able given pt's language deficit, and monitoring/upgrading pt's diet as appropriate.Pt knows she will need to participate at home for therapy here to be of any assistance.    Speech Therapy Frequency  2x / week    Duration  --   8 weeks   Treatment/Interventions  Diet toleration management by SLP;Trials of upgraded texture/liquids;Language facilitation;Internal/external aids;Cognitive reorganization;Cueing hierarchy;SLP instruction and feedback;Patient/family education;Compensatory strategies;Multimodal communcation approach;Functional tasks;Environmental controls    Potential to Achieve Goals  Fair    Potential Considerations  Severity of impairments;Cooperation/participation level       Patient will benefit from skilled therapeutic intervention in order to improve the following deficits and impairments:   Verbal apraxia  Aphasia  Cognitive communication deficit  Dysphagia, unspecified type    Problem List Patient Active Problem List   Diagnosis Date Noted  . Nontraumatic subcortical hemorrhage of left cerebral hemisphere ([place]) 08/27/2019  . Acute blood loss anemia   . Hypoalbuminemia due to protein-calorie malnutrition ([place])   . Thrombocytopenia ([place])   . Dysphagia 07/29/2019  . Infarction of left basal ganglia ([place]) 07/18/2019  . Hypernatremia   . Leukocytosis   . Essential hypertension   . Global aphasia   . Cytotoxic brain edema ([place]) 07/10/2019  . ICH (intracerebral hemorrhage) ([place]) 07/09/2019  . Anxiety state 02/21/2015  . Cephalalgia 12/21/2014    [place] ,[place], [place]  11/02/2019, 5:33 PM  [facility] [address] [place], [place], [address] Phone: [phone]   Fax:  [phone]   Name: Ariannah Arenson MRN: [id number] Date of Birth: 07-Sep-1965

## 2019-11-04 ENCOUNTER — Ambulatory Visit: Payer: Managed Care, Other (non HMO)

## 2019-11-04 ENCOUNTER — Other Ambulatory Visit: Payer: Self-pay

## 2019-11-04 ENCOUNTER — Ambulatory Visit: Payer: Managed Care, Other (non HMO) | Admitting: Occupational Therapy

## 2019-11-04 ENCOUNTER — Encounter: Payer: Self-pay | Admitting: Occupational Therapy

## 2019-11-04 DIAGNOSIS — R482 Apraxia: Secondary | ICD-10-CM | POA: Diagnosis not present

## 2019-11-04 DIAGNOSIS — R208 Other disturbances of skin sensation: Secondary | ICD-10-CM

## 2019-11-04 DIAGNOSIS — R2689 Other abnormalities of gait and mobility: Secondary | ICD-10-CM

## 2019-11-04 DIAGNOSIS — I69151 Hemiplegia and hemiparesis following nontraumatic intracerebral hemorrhage affecting right dominant side: Secondary | ICD-10-CM

## 2019-11-04 DIAGNOSIS — R4701 Aphasia: Secondary | ICD-10-CM

## 2019-11-04 DIAGNOSIS — M6281 Muscle weakness (generalized): Secondary | ICD-10-CM

## 2019-11-04 DIAGNOSIS — R1310 Dysphagia, unspecified: Secondary | ICD-10-CM

## 2019-11-04 DIAGNOSIS — R41841 Cognitive communication deficit: Secondary | ICD-10-CM

## 2019-11-04 DIAGNOSIS — I69219 Unspecified symptoms and signs involving cognitive functions following other nontraumatic intracranial hemorrhage: Secondary | ICD-10-CM

## 2019-11-04 NOTE — Therapy (Signed)
[facility] [address] [place], [place], [address] Phone: [phone]   Fax:  [phone]  Occupational Therapy Treatment  Patient Details  Name: Melanie Madden MRN: [id number] Date of Birth: 28-Oct-1965 Referring Provider (OT): Dr. Alysia Penna   Encounter Date: 11/04/2019  OT End of Session - 11/04/19 1118    Visit Number  18    Number of Visits  25    Date for OT Re-Evaluation  12/01/19    Authorization Type  [employer] 2021:  No prior auth OT, 60 DAY visit limit (regardless of how many therapies each day).    Authorization - Visit Number  21    Authorization - Number of Visits  96    OT Start Time  1022    OT Stop Time  1102    OT Time Calculation (min)  40 min    Activity Tolerance  Patient tolerated treatment well    Behavior During Therapy  Impulsive       Past Medical History:  Diagnosis Date  . Allergy   . Anxiety   . Arthritis   . High triglycerides   . Kidney stones   . Migraines   . Recurrent sinus infections     Past Surgical History:  Procedure Laterality Date  . ANTERIOR CRUCIATE LIGAMENT REPAIR Left   . BUNIONECTOMY Right   . LASIK    . LITHOTRIPSY    . MENISCUS REPAIR Left   . NASAL SEPTUM SURGERY    . SHOULDER SURGERY    . VARICOSE VEIN SURGERY Left     There were no vitals filed for this visit.  Subjective Assessment - 11/04/19 1113    Subjective   Husband reports that he plans to return to work this week or next week and aides will be bringing her to therapy.   2 aides present today to observe.    Patient is accompanied by:  --   aides, husband present at beginning of session   Pertinent History  Subcortical hemorrhage of R cerbral hemisphere.  PMH:  HTN, anxiety, migraines, hx of shoulder injection approx 2 weeks ago for R shoulder pain, hx of R shoulder surgery (arthroscopic)    Limitations  aphasia, fall risk, impulsive (husband reports hx of OCD)    Patient Stated Goals   husband reports: to be able to care for herself more (pt indicates that she onces to return to previous activities per questioning/gestures)    Currently in Pain?  No/denies         Transfer w/c>mat with min-mod A (therapist blocking knee).  In sitting,  light wt bearing with body on arm movements (LUE reach to targets) to incr activation RUE/trunk with min facilitation and cueing for impulsivity.  Incr RUE activation noted with reaching laterally with LUE today  Light wt. Bearing in forward flex (low range) with BUEs, with min facilitation  for incr RUE stabilization and trunk activation.  Light wt. Bearing/AAROM shoulder flex/elbow ext with tilted stool with min facilitation initially, then without facilitation/min cueing only.  With repetition, pt able to consistently perform with initiating and sustaining movement to approx mid-range ext.    In sitting, bilateral low-range functional reach for RUE stabilization/AAROM from in small range with min facilitation and cueing for normal movement patterns/impulsivity initially, then, with repetition after set-up, pt able to stabilize RUE on ball with only cueing to ensure 5th digit did not catch on RLE brace.    Therapist assisted low range reach  AAROM to grasp cylinder object with mod facilitation.  Then holding/stabilizing small cylinder object in hand inconsistently with cueing and with mod facilitation to release.  Reviewed new exercise for HEP with aides and educated them on importance of incr attention to LUE, incr participation in ADLs, and encouraged/answered questions as able.          OT Education - 11/04/19 1114    Education Details  Added exercise to HEP--see pt instructions    Person(s) Educated  Patient;Caregiver(s)   aides   Methods  Explanation;Demonstration;Verbal cues;Handout;Tactile cues    Comprehension  Verbalized understanding;Returned demonstration;Verbal cues required       OT Short Term Goals - 10/12/19 1216       OT SHORT TERM GOAL #1   Title  Pt/husband will be independent with initial HEP for RUE ROM--check STGs 10/14/19    Time  6    Period  Weeks    Status  Achieved      OT SHORT TERM GOAL #2   Title  Pt will perform UB dressing with min A.    Time  6    Period  Weeks    Status  On-going   MOD ASSIST     OT SHORT TERM GOAL #3   Title  Pt will perform UB bathing with mod A.    Time  6    Period  Weeks    Status  Achieved      OT SHORT TERM GOAL #4   Title  Pt will be able to brush hair with set-up.    Time  6    Period  Weeks    Status  On-going   fluctuating assist needed     OT SHORT TERM GOAL #5   Title  Pt will demo at least 110* shoulder flex PROM in supine without pain for incr ease for ADLs.    Time  6    Period  Weeks    Status  Achieved        OT Long Term Goals - 09/02/19 1229      OT LONG TERM GOAL #1   Title  Pt/husband will be independent with HEP for functional activities for incr participation in IADLs/leisure tasks and for cognition (and incorporating RUE if able).--check LTGs 12/01/19    Time  12    Period  Weeks    Status  New      OT LONG TERM GOAL #2   Title  Pt will perform UB dressing with supervision.    Time  12    Period  Weeks    Status  New      OT LONG TERM GOAL #3   Title  Pt will perform LB dressing with mod A.    Time  12    Period  Weeks    Status  New      OT LONG TERM GOAL #4   Title  Pt will perform UB bathing with min A.    Time  12    Period  Weeks    Status  New      OT LONG TERM GOAL #5   Title  Pt will perform simple cold snack prep from w/c level with min A/set up.    Time  12    Period  Weeks    Status  New      OT LONG TERM GOAL #6   Title  Pt will perform toileting with mod A.  Time  12    Period  Weeks    Status  New            Plan - 11/04/19 1119    Clinical Impression Statement  Pt continues to make improvements in RUE activation in closed-chain activities with incr awareness of RUE and  attention.  Pt also demo decr impulsivity with cueing.    Occupational performance deficits (Please refer to evaluation for details):  ADL's;IADL's;Leisure;Work;Social Participation    Body Structure / Function / Physical Skills  ADL;ROM;IADL;Sensation;Mobility;Strength;Tone;UE functional use;Decreased knowledge of precautions;Decreased knowledge of use of DME;Coordination;Balance;FMC;GMC    Cognitive Skills  Attention;Temperament/Personality;Understand;Thought;Safety Awareness    OT Frequency  2x / week    OT Duration  12 weeks    OT Treatment/Interventions  Self-care/ADL training;Moist Heat;DME and/or AE instruction;Splinting;Therapeutic activities;Aquatic Therapy;Cognitive remediation/compensation;Therapeutic exercise;Cryotherapy;Neuromuscular education;Functional Mobility Training;Passive range of motion;Visual/perceptual remediation/compensation;Patient/family education;Manual Therapy;Electrical Stimulation;Fluidtherapy    Plan  continue to address ADLs, neuro re-ed including use of tilted stool and wt. bearing, closed chain activities    Consulted and Agree with Plan of Care  Patient;Family member/caregiver    Family Member Consulted  husband       Patient will benefit from skilled therapeutic intervention in order to improve the following deficits and impairments:   Body Structure / Function / Physical Skills: ADL, ROM, IADL, Sensation, Mobility, Strength, Tone, UE functional use, Decreased knowledge of precautions, Decreased knowledge of use of DME, Coordination, Balance, FMC, GMC Cognitive Skills: Attention, Temperament/Personality, Understand, Thought, Safety Awareness     Visit Diagnosis: Hemiplegia and hemiparesis following nontraumatic intracerebral hemorrhage affecting right dominant side (HCC)  Muscle weakness (generalized)  Other abnormalities of gait and mobility  Other disturbances of skin sensation  Unspecified symptoms and signs involving cognitive functions following  other nontraumatic intracranial hemorrhage    Problem List Patient Active Problem List   Diagnosis Date Noted  . Nontraumatic subcortical hemorrhage of left cerebral hemisphere ([facility]) 08/27/2019  . Acute blood loss anemia   . Hypoalbuminemia due to protein-calorie malnutrition ([facility])   . Thrombocytopenia ([facility])   . Dysphagia 07/29/2019  . Infarction of left basal ganglia ([facility]) 07/18/2019  . Hypernatremia   . Leukocytosis   . Essential hypertension   . Global aphasia   . Cytotoxic brain edema ([facility]) 07/10/2019  . ICH (intracerebral hemorrhage) ([facility]) 07/09/2019  . Anxiety state 02/21/2015  . Cephalalgia 12/21/2014    [facility] 11/04/2019, 11:38 AM  [facility] [address] [place], [place], [address] Phone: [phone]   Fax:  [phone]  Name: Melanie Madden MRN: [id number] Date of Birth: [birth year]   Vianne Bulls, OTR/L [facility] [address] [place], [place]  [address] [phone] phone [phone] 11/04/19 11:38 AM

## 2019-11-04 NOTE — Therapy (Signed)
[facility] [address] [place], [place], [address] Phone: [phone]   Fax:  [phone]  Speech Language Pathology Treatment  Patient Details  Name: Melanie Madden MRN: [id number] Date of Birth: July 22, 1966 Referring Provider (SLP): Alysia Penna, MD   Encounter Date: 11/04/2019  End of Session - 11/04/19 1738    Visit Number  19    Number of Visits  25    Date for SLP Re-Evaluation  12/04/19    SLP Start Time  0936    SLP Stop Time   1016    SLP Time Calculation (min)  40 min    Activity Tolerance  Patient tolerated treatment well       Past Medical History:  Diagnosis Date  . Allergy   . Anxiety   . Arthritis   . High triglycerides   . Kidney stones   . Migraines   . Recurrent sinus infections     Past Surgical History:  Procedure Laterality Date  . ANTERIOR CRUCIATE LIGAMENT REPAIR Left   . BUNIONECTOMY Right   . LASIK    . LITHOTRIPSY    . MENISCUS REPAIR Left   . NASAL SEPTUM SURGERY    . SHOULDER SURGERY    . VARICOSE VEIN SURGERY Left     There were no vitals filed for this visit.  Subjective Assessment - 11/04/19 0940    Subjective  Shook head no, when asked about pain.    Patient is accompained by:  --   caregivers Tasha (and [place])   Currently in Pain?  No/denies            ADULT SLP TREATMENT - 11/04/19 0941      General Information   Behavior/Cognition  Alert;Cooperative;Doesn't follow directions;Requires cueing;Impulsive;Agitated      Treatment Provided   Treatment provided  Cognitive-Linquistic      Cognitive-Linquistic Treatment   Treatment focused on  Aphasia;Apraxia    Skilled Treatment  (husband, re: [employer]) "Anything to decrease the frustration level (for communication)."  SLP concted by Yetta Flock at [facility] who stated she would send device for device trial after speaking with [name] re: financial coverage. At the phoneme level, pt production in  spontaneously was 50% success with /h/, and 30% with /m/. "I need" coupled with a sign req'd max assist consistently - SLP assumes too much motor movement overall. SLP educated caregivers who attended with pt today - [place] and [place].       Assessment / Recommendations / Plan   Plan  Continue with current plan of care   begin device trial when device arrives     Progression Toward Goals   Progression toward goals  Progressing toward goals       SLP Education - 11/04/19 1737    Education Details  need for pt to practice at home, modeling phoneme practice    Person(s) Educated  Patient;Caregiver(s)    Methods  Demonstration;Verbal cues    Comprehension  Verbalized understanding;Need further instruction       SLP Short Term Goals - 10/07/19 1239      SLP SHORT TERM GOAL #1   Title  pt will simultaneously with SLP produce 3 different phonemes 50% of the time over three sessions    Status  Partially Met      SLP SHORT TERM GOAL #2   Title  pt will ID objects salient to pt in f:4 80% of the time over 3 sessions    Status  Deferred  SLP SHORT TERM GOAL #3   Title  pt will write her full name on first attempt over three sessions    Time  1    Status  Deferred      SLP SHORT TERM GOAL #4   Title  pt will attempt expressive communication via multimodal means during 5 sessions    Status  Not Met      SLP SHORT TERM GOAL #5   Title  pt will demonstrate undertanding of simple 1-step functional commands with 70% accuracy with multimodal cues over 3 sessions    Status  Not Met      SLP SHORT TERM GOAL #6   Title  pt will look at object or line drawing and match the correct word from f:6 words 90% success over 3 sessions    Status  Deferred       SLP Long Term Goals - 11/04/19 1740      SLP LONG TERM GOAL #1   Title  pt's diet will be appropriately upgraded as needed by SLP    Time  3    Period  Weeks   or 25 total visits, for all LTGs   Status  On-going      SLP LONG  TERM GOAL #2   Title  pt will produce 3 simple words/words pertinent to pt (family or pet names, etc) simultaneously with SLP and multimodal cues over three sessions    Time  3    Period  Weeks    Status  Revised      SLP LONG TERM GOAL #3   Title  pt will demo understanding of simple yes/no questions pertinent to desires/wants with multimodal cues 90% of the time over 3 sessions    Time  3    Period  Weeks    Status  On-going      SLP LONG TERM GOAL #4   Title  a device trial with a speech generating device will be initiated within the first 20 ST visits    Time  3    Period  Weeks    Status  On-going      SLP LONG TERM GOAL #5   Title  pt will demo recognition of written word for a common object 100% success over 3 sessions    Time  3    Period  Weeks    Status  On-going      SLP LONG TERM GOAL #6   Title  pt will work on speech-related tasks at home between 75% of sessions as reported by family and/or noted on [facility] website    Baseline  10-21-19, 10-26-19, 10-28-19    Time  3    Period  Weeks    Status  On-going       Plan - 11/04/19 1738    Clinical Impression Statement  Cont with no complaints with dys III diet/thin, to SLP knowledge. Pt WILL NEED TO COMPLY with daily work at home for ST to be most effective. Pt cont to demo cognitive communication deficits in at least the areas of attention (incr'd impulsivity) and awareness (unaware of verbal deficits -vs/in addition to receptive languge deficit). Pt cont to present with a severe/profound expressive aphasia with verbal apraxia, and severe/profound receptive aphasia ("global aphasia"). A device trial with [employer] will begin after Alyssa from [employer].See "skilled treatment" for more details of today's session. She would cont to benefit from skilled ST focusing on incr'ing communicative  effectiveness via multimodal means and incr'ing functional communication (verbal or nonverbal), incr'ing  cognition in functional ways as able given pt's language deficit, and monitoring/upgrading pt's diet as appropriate.Pt knows she will need to participate at home for therapy here to be of any assistance.    Speech Therapy Frequency  2x / week    Duration  --   8 weeks   Treatment/Interventions  Diet toleration management by SLP;Trials of upgraded texture/liquids;Language facilitation;Internal/external aids;Cognitive reorganization;Cueing hierarchy;SLP instruction and feedback;Patient/family education;Compensatory strategies;Multimodal communcation approach;Functional tasks;Environmental controls    Potential to Achieve Goals  Fair    Potential Considerations  Severity of impairments;Cooperation/participation level       Patient will benefit from skilled therapeutic intervention in order to improve the following deficits and impairments:   Verbal apraxia  Aphasia  Cognitive communication deficit  Dysphagia, unspecified type    Problem List Patient Active Problem List   Diagnosis Date Noted  . Nontraumatic subcortical hemorrhage of left cerebral hemisphere ([facility]) 08/27/2019  . Acute blood loss anemia   . Hypoalbuminemia due to protein-calorie malnutrition ([facility])   . Thrombocytopenia ([facility])   . Dysphagia 07/29/2019  . Infarction of left basal ganglia ([facility]) 07/18/2019  . Hypernatremia   . Leukocytosis   . Essential hypertension   . Global aphasia   . Cytotoxic brain edema ([facility]) 07/10/2019  . ICH (intracerebral hemorrhage) ([facility]) 07/09/2019  . Anxiety state 02/21/2015  . Cephalalgia 12/21/2014    [facility], [place]  11/04/2019, 5:40 PM  [facility] [address] [place], [place], [address] Phone: [phone]   Fax:  [phone]   Name: Melanie Madden MRN: [id number] Date of Birth: Mar 07, 1966

## 2019-11-04 NOTE — Patient Instructions (Signed)
   Hold ball, shoe box (on the ends), or papertowel roll (on the ends) with both hands (squeeze the ball/shoebox/papertowel roll) while ball is in your lap.  Try to slowly move ball to the floor with BOTH arms.  Go slow so that your left arm has to work.  Then bring it slowly back to your lap.  Repeat 10 times, 2x/day.

## 2019-11-04 NOTE — Therapy (Signed)
[facility] [address] [place], [place], [address] Phone: [phone]   Fax:  [phone]  Physical Therapy Treatment  Patient Details  Name: Melanie Madden MRN: [id number] Date of Birth: 12-20-1965 Referring Provider (PT): Lauraine Rinne, [place]   Encounter Date: 11/04/2019  PT End of Session - 11/04/19 1106    Visit Number  19    Number of Visits  35    Date for PT Re-Evaluation  12/30/19    Authorization Type  [employer]- 60 VL combined PT/OT/ST - counts as 1 visit if seen on same day.  Not sure if 60 is hard max.  $50 copay each day - make sure PT/OT/ST on same day if possible    Authorization - Visit Number  --   PT/OT/ST days combined   Authorization - Number of Visits  60    PT Start Time  1103    PT Stop Time  1145    PT Time Calculation (min)  42 min    Equipment Utilized During Treatment  Gait belt   pt's R AFO; dance shoe cover to aid in foot clearance   Activity Tolerance  Patient tolerated treatment well    Behavior During Therapy  Impulsive       Past Medical History:  Diagnosis Date  . Allergy   . Anxiety   . Arthritis   . High triglycerides   . Kidney stones   . Migraines   . Recurrent sinus infections     Past Surgical History:  Procedure Laterality Date  . ANTERIOR CRUCIATE LIGAMENT REPAIR Left   . BUNIONECTOMY Right   . LASIK    . LITHOTRIPSY    . MENISCUS REPAIR Left   . NASAL SEPTUM SURGERY    . SHOULDER SURGERY    . VARICOSE VEIN SURGERY Left     There were no vitals filed for this visit.  Subjective Assessment - 11/04/19 1105    Subjective  No Changes. Husband will be going back to work in 1-2 weeks, and a caregiver will begin bringing Melanie Madden to therapy sessions. No pain today.    Patient is accompained by:  --   Caregivers   Patient Stated Goals  Per husband report they would like her to get stronger on right, improve mobility, and work on speech.    Currently in Pain?  No/denies                        [facility] Adult PT Treatment/Exercise - 11/04/19 1150      Bed Mobility   Bed Mobility  Sit to Supine;Supine to Sit    Supine to Sit  Supervision/Verbal cueing   VC for R upper extremity management   Sit to Supine  Supervision/Verbal cueing      Transfers   Transfers  Sit to Stand;Stand to Sit    Sit to Stand  4: Min assist    Sit to Stand Details  Tactile cues for weight shifting;Tactile cues for posture   verbal cues for possture. AFO donned.    Sit to Stand Details (indicate cue type and reason)  Completed with AFO donned. x 5 reps with 2" block under L lower extremity to further promote weightshift to R and further strengthen R lower extremity. 1 x 10 reps with focus on upright posture, mirror placed in front to promote visual feedback.     Stand to Sit  4: Min assist    Stand to  Sit Details (indicate cue type and reason)  --   verbal cues for controlled descent     Ambulation/Gait   Ambulation/Gait  Yes    Ambulation/Gait Assistance  3: Mod assist    Ambulation/Gait Assistance Details  completed with RUE supported on RW with R hand orthosis - therapist facilitating lateral weight shifts and promoting R swing. Min A for R foot placement during ambulation. Verbal cues for improved step length on L lower extremity to promote weight bearing on R. PT tech assisted with advancing and direction of RW at times.     Ambulation Distance (Feet)  460 Feet   2 x 230; rest break between   Assistive device  Rolling walker   R handgrip attachment   Gait Pattern  Step-to pattern;Step-through pattern;Decreased hip/knee flexion - right;Poor foot clearance - right;Right circumduction    Ambulation Surface  Level;Indoor      Knee/Hip Exercises: Supine   Bridges  Strengthening;Both;3 sets;10 reps    Bridges Limitations  approximation and light support provided at R knee for control throughout.     Other Supine Knee/Hip Exercises  Completed R lower extremity PNF -  with focus on patient activately extended knee/hip during motion. Completed x 5 reps passively to teach pt movement, and then progressed to active contraction during extension phase.                PT Short Term Goals - 10/28/19 2028      PT SHORT TERM GOAL #1   Title  Pt will be able to perform all transfers CGA for improved mobility.    Time  4    Period  Weeks    Status  New    Target Date  12/02/19      PT SHORT TERM GOAL #2   Title  Pt will be able to maintain standing with 1 UE support x 5 min to assist with standing ADLs supervision.    Time  4    Period  Weeks    Status  New    Target Date  12/02/19      PT SHORT TERM GOAL #3   Title  Pt will ambulate >350' with min assist of 1 person with RW with right hand grip attachment so that husband can safely ambulate with her at home.    Time  4    Period  Weeks    Status  New    Target Date  12/02/19        PT Long Term Goals - 10/28/19 2031      PT LONG TERM GOAL #1   Title  Pt will be able to perform progressive HEP for strengthening, balance and mobility with assist of husband to continued gains at home.    Time  8    Period  Weeks    Status  On-going    Target Date  12/30/19      PT LONG TERM GOAL #2   Title  Pt will decrease 5 x sit to stand from 27 sec to <22 sec from edge of mat for improved balance and functional mobility.    Time  8    Period  Weeks    Status  New    Target Date  12/30/19      PT LONG TERM GOAL #3   Title  Pt will ambulate >400' CGA with RW with right hand grip attachment for improved gait ability in home.    Time  8  Period  Weeks    Status  New    Target Date  12/30/19            Plan - 11/04/19 1203    Clinical Impression Statement  Today's session focused on continued transfer and gait training and LE strengthening. Patient demonstrating improved muscle contraction and strength with PNF techniques.  Patient demonstrating improved ability to ambulate longer gait  distances, but still requires some assistance with foot placement of R lower extremity currently. She will continue to benefit from skilled PT services to address strength and gait training for further improvements in functional mobility.    Personal Factors and Comorbidities  Comorbidity 3+    Comorbidities  anxiety, migraines, HTN    Examination-Activity Limitations  Bathing;Bed Mobility;Stand;Locomotion Level;Squat;Stairs;Dressing;Transfers    Examination-Participation Restrictions  Community Activity;Driving;Shop;Laundry;Meal Prep    Stability/Clinical Decision Making  Evolving/Moderate complexity    Rehab Potential  Fair    PT Frequency  2x / week    PT Duration  8 weeks    PT Treatment/Interventions  ADLs/Self Care Home Management;Electrical Stimulation;DME Instruction;Gait training;Stair training;Functional mobility training;Therapeutic activities;Therapeutic exercise;Orthotic Fit/Training;Patient/family education;Neuromuscular re-education;Balance training;Manual techniques;Passive range of motion;Vestibular;Wheelchair mobility training    PT Next Visit Plan  [place] to front of right shoe to help slide. Continue with activities in standing to facilitate increased right weight shift, continue gait training with RW with right hand grip attachment.    Consulted and Agree with Plan of Care  Patient;Family member/caregiver    Family Member Consulted  husband       Patient will benefit from skilled therapeutic intervention in order to improve the following deficits and impairments:  Abnormal gait, Decreased activity tolerance, Decreased balance, Decreased cognition, Decreased knowledge of use of DME, Decreased mobility, Decreased range of motion, Decreased safety awareness, Decreased strength, Impaired sensation  Visit Diagnosis: Hemiplegia and hemiparesis following nontraumatic intracerebral hemorrhage affecting right dominant side (HCC)  Muscle weakness (generalized)  Other  abnormalities of gait and mobility     Problem List Patient Active Problem List   Diagnosis Date Noted  . Nontraumatic subcortical hemorrhage of left cerebral hemisphere ([place]) 08/27/2019  . Acute blood loss anemia   . Hypoalbuminemia due to protein-calorie malnutrition ([place])   . Thrombocytopenia ([place])   . Dysphagia 07/29/2019  . Infarction of left basal ganglia ([place]) 07/18/2019  . Hypernatremia   . Leukocytosis   . Essential hypertension   . Global aphasia   . Cytotoxic brain edema ([place]) 07/10/2019  . ICH (intracerebral hemorrhage) ([place]) 07/09/2019  . Anxiety state 02/21/2015  . Cephalalgia 12/21/2014    Jones Bales, PT, DPT 11/04/2019, 12:10 PM  [facility] [address] [place], [place], [address] Phone: [phone]   Fax:  [phone]  Name: Melanie Madden MRN: [id number] Date of Birth: 1966-06-21

## 2019-11-11 ENCOUNTER — Ambulatory Visit: Payer: Managed Care, Other (non HMO)

## 2019-11-11 ENCOUNTER — Encounter: Payer: Self-pay | Admitting: Occupational Therapy

## 2019-11-11 ENCOUNTER — Ambulatory Visit: Payer: Managed Care, Other (non HMO) | Admitting: Occupational Therapy

## 2019-11-11 ENCOUNTER — Other Ambulatory Visit: Payer: Self-pay

## 2019-11-11 DIAGNOSIS — R208 Other disturbances of skin sensation: Secondary | ICD-10-CM

## 2019-11-11 DIAGNOSIS — R1310 Dysphagia, unspecified: Secondary | ICD-10-CM

## 2019-11-11 DIAGNOSIS — I69151 Hemiplegia and hemiparesis following nontraumatic intracerebral hemorrhage affecting right dominant side: Secondary | ICD-10-CM

## 2019-11-11 DIAGNOSIS — R482 Apraxia: Secondary | ICD-10-CM

## 2019-11-11 DIAGNOSIS — I69219 Unspecified symptoms and signs involving cognitive functions following other nontraumatic intracranial hemorrhage: Secondary | ICD-10-CM

## 2019-11-11 DIAGNOSIS — R4701 Aphasia: Secondary | ICD-10-CM

## 2019-11-11 DIAGNOSIS — R2689 Other abnormalities of gait and mobility: Secondary | ICD-10-CM

## 2019-11-11 DIAGNOSIS — R41841 Cognitive communication deficit: Secondary | ICD-10-CM

## 2019-11-11 DIAGNOSIS — M6281 Muscle weakness (generalized): Secondary | ICD-10-CM

## 2019-11-11 NOTE — Therapy (Signed)
[facility] [address] [place], [place], [address] Phone: [phone]   Fax:  [phone]  Speech Language Pathology Treatment  Patient Details  Name: Melanie Madden MRN: [id number] Date of Birth: 20-Jan-1966 Referring Provider (SLP): Alysia Penna, MD   Encounter Date: 11/11/2019  End of Session - 11/11/19 1328    Visit Number  20    Number of Visits  25    Date for SLP Re-Evaluation  12/04/19    Authorization Type  [employer]    SLP Start Time  [phone]    SLP Stop Time   1015    SLP Time Calculation (min)  41 min    Activity Tolerance  Patient tolerated treatment well       Past Medical History:  Diagnosis Date  . Allergy   . Anxiety   . Arthritis   . High triglycerides   . Kidney stones   . Migraines   . Recurrent sinus infections     Past Surgical History:  Procedure Laterality Date  . ANTERIOR CRUCIATE LIGAMENT REPAIR Left   . BUNIONECTOMY Right   . LASIK    . LITHOTRIPSY    . MENISCUS REPAIR Left   . NASAL SEPTUM SURGERY    . SHOULDER SURGERY    . VARICOSE VEIN SURGERY Left     There were no vitals filed for this visit.  Subjective Assessment - 11/11/19 1313    Subjective  Attends with [name] and with Aniceto Boss, caregiver. Lingraphica device trial begins.    Patient is accompained by:  --   caregivers Tasha (and [place])   Currently in Pain?  No/denies            ADULT SLP TREATMENT - 11/11/19 1313      General Information   Behavior/Cognition  Alert;Cooperative;Agitated;Requires cueing      Treatment Provided   Treatment provided  Cognitive-Linquistic      Cognitive-Linquistic Treatment   Treatment focused on  Apraxia;Aphasia    Skilled Treatment  Introduced pt's device (mini talk) to pt and husband (and Aniceto Boss, caregiver) today. SLP educated pt/party with basic programming including changing icon picture, changing icon name, new icon name/picture, picture search on device and on internet.  Pt req'd mod-max cues for participation accessing device and for typing - did not initiate anything herself - SLP questions pt's buy-in to use device at this time. SLP spent 7-8 minutes with pt's husband after session - told SLP pt will need to see benefit of device in order to use it but that HE sees GREAT benefit for pt having the device.  SLP encouraged him to have device near pt, for him to delete icons/customize and to simplify visual makeup of the device, and to have pt famiiarize herself with page navigation. Husband demo'd understandinf of need for these things. At end of session SLP asked pt to tell husband what she wanted for lunch and pt furrowed her brow and raised her voice at SLP while pointing to her husband. This continued for approx 30 seconds until husband figured out pt was communicating that she doesn't have lunch. SLP encouraged pt to then tell husband what she would like to eat for dinner. Pt shook her head and SLP suggested "the next meal you have, then, you can tell [name] what you want."       Assessment / Recommendations / Plan   Plan  Continue with current plan of care      Progression Toward Goals  Progression toward goals  Progressing toward goals       SLP Education - 11/11/19 1319    Education Details  basic programming for Lingraphica, see "skilled treatment" for more information    Person(s) Educated  Patient;Spouse;Caregiver(s)    Methods  Explanation;Demonstration;Verbal cues    Comprehension  Verbalized understanding;Returned demonstration;Verbal cues required;Need further instruction       SLP Short Term Goals - 10/07/19 1239      SLP SHORT TERM GOAL #1   Title  pt will simultaneously with SLP produce 3 different phonemes 50% of the time over three sessions    Status  Partially Met      SLP [place] #2   Title  pt will ID objects salient to pt in f:4 80% of the time over 3 sessions    Status  Deferred      SLP SHORT TERM GOAL #3   Title  pt  will write her full name on first attempt over three sessions    Time  1    Status  Deferred      SLP SHORT TERM GOAL #4   Title  pt will attempt expressive communication via multimodal means during 5 sessions    Status  Not Met      SLP SHORT TERM GOAL #5   Title  pt will demonstrate undertanding of simple 1-step functional commands with 70% accuracy with multimodal cues over 3 sessions    Status  Not Met      SLP SHORT TERM GOAL #6   Title  pt will look at object or line drawing and match the correct word from f:6 words 90% success over 3 sessions    Status  Deferred       SLP Long Term Goals - 11/11/19 1334      SLP LONG TERM GOAL #1   Title  pt's diet will be appropriately upgraded as needed by SLP    Time  2    Period  Weeks   or 25 total visits, for all LTGs   Status  On-going      SLP LONG TERM GOAL #2   Title  pt will produce 3 simple words/words pertinent to pt (family or pet names, etc) simultaneously with SLP and multimodal cues over three sessions    Time  2    Period  Weeks    Status  On-going      SLP LONG TERM GOAL #3   Title  pt will demo understanding of simple yes/no questions pertinent to desires/wants with multimodal cues 90% of the time over 3 sessions    Time  2    Period  Weeks    Status  On-going      SLP LONG TERM GOAL #4   Title  a device trial with a speech generating device will be initiated within the first 20 ST visits    Status  Achieved      SLP LONG TERM GOAL #5   Title  pt will demo recognition of written word for a common object 100% success over 3 sessions    Time  2    Period  Weeks    Status  On-going      SLP LONG TERM GOAL #6   Title  pt will work on speech-related tasks at home between 75% of sessions as reported by family and/or noted on [place] website    Baseline  10-21-19, 10-26-19, 10-28-19, 11-11-19  Time  2    Period  Weeks    Status  On-going       Plan - 11/11/19 1333    Clinical Impression  Statement  Cont with no complaints with dys III diet/thin, to SLP knowledge. Pt WILL NEED TO COMPLY with daily work at home for ST to be most effective. Pt cont to demo cognitive communication deficits in at least the areas of attention (incr'd impulsivity) and awareness (appears mostly unaware of verbal deficits vs/in addition to receptive languge deficit). Pt cont to present with a severe/profound expressive aphasia with verbal apraxia, and severe/profound receptive aphasia ("global aphasia"). A device trial with Lingraphica began today.See "skilled treatment" for more details of today's session. She would cont to benefit from skilled ST focusing on incr'ing communicative effectiveness via multimodal means and incr'ing functional communication (verbal or nonverbal), incr'ing cognition in functional ways as able given pt's language deficit, and monitoring/upgrading pt's diet as appropriate.Pt knows she will need to participate at home for therapy here to be of any assistance.    Speech Therapy Frequency  2x / week    Duration  --   8 weeks   Treatment/Interventions  Diet toleration management by SLP;Trials of upgraded texture/liquids;Language facilitation;Internal/external aids;Cognitive reorganization;Cueing hierarchy;SLP instruction and feedback;Patient/family education;Compensatory strategies;Multimodal communcation approach;Functional tasks;Environmental controls    Potential to Achieve Goals  Fair    Potential Considerations  Severity of impairments;Cooperation/participation level       Patient will benefit from skilled therapeutic intervention in order to improve the following deficits and impairments:   Verbal apraxia  Aphasia  Cognitive communication deficit  Dysphagia, unspecified type    Problem List Patient Active Problem List   Diagnosis Date Noted  . Nontraumatic subcortical hemorrhage of left cerebral hemisphere ([place]) 08/27/2019  . Acute blood loss anemia   . Hypoalbuminemia  due to protein-calorie malnutrition ([place])   . Thrombocytopenia ([place])   . Dysphagia 07/29/2019  . Infarction of left basal ganglia ([place]) 07/18/2019  . Hypernatremia   . Leukocytosis   . Essential hypertension   . Global aphasia   . Cytotoxic brain edema ([place]) 07/10/2019  . ICH (intracerebral hemorrhage) ([place]) 07/09/2019  . Anxiety state 02/21/2015  . Cephalalgia 12/21/2014    [place] ,[place], [place]  11/11/2019, 1:36 PM  [facility] [address] [place], [place], [address] Phone: [phone]   Fax:  [phone]   Name: Melanie Madden MRN: [id number] Date of Birth: 1965/12/04

## 2019-11-11 NOTE — Therapy (Signed)
[facility] [address] [place], [place], [address] Phone: [phone]   Fax:  [phone]  Occupational Therapy Treatment  Patient Details  Name: Melanie Madden MRN: [id number] Date of Birth: 10-05-1965 Referring Provider (OT): Dr. Alysia Penna   Encounter Date: 11/11/2019  OT End of Session - 11/11/19 1531    Visit Number  19    Number of Visits  25    Date for OT Re-Evaluation  12/01/19    Authorization Type  [employer] 2021:  No prior auth OT, 60 DAY visit limit (regardless of how many therapies each day).    Authorization - Visit Number  22    Authorization - Number of Visits  48    OT Start Time  1024    OT Stop Time  1103    OT Time Calculation (min)  39 min    Activity Tolerance  Patient tolerated treatment well    Behavior During Therapy  Impulsive       Past Medical History:  Diagnosis Date  . Allergy   . Anxiety   . Arthritis   . High triglycerides   . Kidney stones   . Migraines   . Recurrent sinus infections     Past Surgical History:  Procedure Laterality Date  . ANTERIOR CRUCIATE LIGAMENT REPAIR Left   . BUNIONECTOMY Right   . LASIK    . LITHOTRIPSY    . MENISCUS REPAIR Left   . NASAL SEPTUM SURGERY    . SHOULDER SURGERY    . VARICOSE VEIN SURGERY Left     There were no vitals filed for this visit.  Subjective Assessment - 11/11/19 1529    Subjective   husband reports that pt is doing more with ADLs at home    Patient is accompanied by:  --   aide, husband   Pertinent History  Subcortical hemorrhage of R cerbral hemisphere.  PMH:  HTN, anxiety, migraines, hx of shoulder injection approx 2 weeks ago for R shoulder pain, hx of R shoulder surgery (arthroscopic)    Limitations  aphasia, fall risk, impulsive (husband reports hx of OCD)    Patient Stated Goals  husband reports: to be able to care for herself more (pt indicates that she onces to return to previous activities per  questioning/gestures)    Currently in Pain?  No/denies          Pt initially stood up without assist due to impulsivity.  Then, transfer w/c>mat with min A  (therapist blocking knee).    In sitting,  light wt bearing with body on arm movements (LUE reach to targets) to incr activation RUE/trunk with min-mod facilitation and cueing for impulsivity.    Light wt. Bearing/AAROM shoulder flex/elbow ext with tilted stool with min-mod facilitation initially, then without facilitation/min cueing only.  With repetition, pt able to consistently perform with initiating and sustaining movement in small range--decr attention and frustration affected performance today.    In sitting, bilateral low-range functional reach for RUE stabilization/AAROM from in small range with min facilitation and cueing for normal movement patterns/impulsivity initially, then, with repetition after set-up, pt able to stabilize RUE on ball (but inconsistent with attention).  Therapist assisted low-range supported reach AAROM to grasp cylinder object with mod facilitation.  Then holding/stabilizing small cylinder object in hand inconsistently with cueing and with min facilitation to release (improved today).  AAROM closed chain for shoulder flex and elbow flex/ext with cane and mod facilitation RUE.  OT Education - 11/11/19 1530    Education Details  Reviewed exercise issued last week with husband/reviewed with pt    Person(s) Educated  Patient;Caregiver(s);Spouse    Methods  Explanation;Demonstration;Handout;Verbal cues;Tactile cues    Comprehension  Verbalized understanding;Returned demonstration       OT Short Term Goals - 11/11/19 1533      OT SHORT TERM GOAL #1   Title  Pt/husband will be independent with initial HEP for RUE ROM--check STGs 10/14/19    Time  6    Period  Weeks    Status  Achieved      OT SHORT TERM GOAL #2   Title  Pt will perform UB dressing with min A.    Time  6    Period  Weeks     Status  On-going   MOD ASSIST.  11/11/19:  pt able to don/doff zip-up fleece mod I     OT SHORT TERM GOAL #3   Title  Pt will perform UB bathing with mod A.    Time  6    Period  Weeks    Status  Achieved      OT SHORT TERM GOAL #4   Title  Pt will be able to brush hair with set-up.    Time  6    Period  Weeks    Status  On-going   fluctuating assist needed     OT SHORT TERM GOAL #5   Title  Pt will demo at least 110* shoulder flex PROM in supine without pain for incr ease for ADLs.    Time  6    Period  Weeks    Status  Achieved        OT Long Term Goals - 09/02/19 1229      OT LONG TERM GOAL #1   Title  Pt/husband will be independent with HEP for functional activities for incr participation in IADLs/leisure tasks and for cognition (and incorporating RUE if able).--check LTGs 12/01/19    Time  12    Period  Weeks    Status  New      OT LONG TERM GOAL #2   Title  Pt will perform UB dressing with supervision.    Time  12    Period  Weeks    Status  New      OT LONG TERM GOAL #3   Title  Pt will perform LB dressing with mod A.    Time  12    Period  Weeks    Status  New      OT LONG TERM GOAL #4   Title  Pt will perform UB bathing with min A.    Time  12    Period  Weeks    Status  New      OT LONG TERM GOAL #5   Title  Pt will perform simple cold snack prep from w/c level with min A/set up.    Time  12    Period  Weeks    Status  New      OT LONG TERM GOAL #6   Title  Pt will perform toileting with mod A.    Time  12    Period  Weeks    Status  New            Plan - 11/11/19 1531    Clinical Impression Statement  Pt continues to make improvements in RUE activation in closed-chain activities.  However,  pt more easily distracted, frustrated today/labile today.  (Husband reports she was very emotional yesterday).    Occupational performance deficits (Please refer to evaluation for details):  ADL's;IADL's;Leisure;Work;Social Participation    Body  Structure / Function / Physical Skills  ADL;ROM;IADL;Sensation;Mobility;Strength;Tone;UE functional use;Decreased knowledge of precautions;Decreased knowledge of use of DME;Coordination;Balance;FMC;GMC    Cognitive Skills  Attention;Temperament/Personality;Understand;Thought;Safety Awareness    OT Frequency  2x / week    OT Duration  12 weeks    OT Treatment/Interventions  Self-care/ADL training;Moist Heat;DME and/or AE instruction;Splinting;Therapeutic activities;Aquatic Therapy;Cognitive remediation/compensation;Therapeutic exercise;Cryotherapy;Neuromuscular education;Functional Mobility Training;Passive range of motion;Visual/perceptual remediation/compensation;Patient/family education;Manual Therapy;Electrical Stimulation;Fluidtherapy    Plan  continue to address ADLs, neuro re-ed including use of tilted stool and wt. bearing, closed chain activities    Consulted and Agree with Plan of Care  Patient;Family member/caregiver    Family Member Consulted  husband       Patient will benefit from skilled therapeutic intervention in order to improve the following deficits and impairments:   Body Structure / Function / Physical Skills: ADL, ROM, IADL, Sensation, Mobility, Strength, Tone, UE functional use, Decreased knowledge of precautions, Decreased knowledge of use of DME, Coordination, Balance, FMC, GMC Cognitive Skills: Attention, Temperament/Personality, Understand, Thought, Safety Awareness     Visit Diagnosis: Hemiplegia and hemiparesis following nontraumatic intracerebral hemorrhage affecting right dominant side (HCC)  Muscle weakness (generalized)  Other abnormalities of gait and mobility  Other disturbances of skin sensation  Unspecified symptoms and signs involving cognitive functions following other nontraumatic intracranial hemorrhage    Problem List Patient Active Problem List   Diagnosis Date Noted  . Nontraumatic subcortical hemorrhage of left cerebral hemisphere ([place])  08/27/2019  . Acute blood loss anemia   . Hypoalbuminemia due to protein-calorie malnutrition ([place])   . Thrombocytopenia ([place])   . Dysphagia 07/29/2019  . Infarction of left basal ganglia ([place]) 07/18/2019  . Hypernatremia   . Leukocytosis   . Essential hypertension   . Global aphasia   . Cytotoxic brain edema ([place]) 07/10/2019  . ICH (intracerebral hemorrhage) ([place]) 07/09/2019  . Anxiety state 02/21/2015  . Cephalalgia 12/21/2014    [facility] 11/11/2019, 3:34 PM  [facility] [address] [place], [place], [address] Phone: [phone]   Fax:  [phone]  Name: Jacoya Cressy MRN: [id number] Date of Birth: January 03, 1966   Vianne Bulls, OTR/L [facility] [address] [place], [place]  [address] [phone] phone [phone] 11/11/19 3:34 PM

## 2019-11-13 ENCOUNTER — Encounter: Payer: Self-pay | Admitting: Occupational Therapy

## 2019-11-13 ENCOUNTER — Encounter: Payer: Self-pay | Admitting: Physical Therapy

## 2019-11-13 ENCOUNTER — Ambulatory Visit: Payer: Managed Care, Other (non HMO) | Admitting: Occupational Therapy

## 2019-11-13 ENCOUNTER — Ambulatory Visit: Payer: Managed Care, Other (non HMO)

## 2019-11-13 ENCOUNTER — Other Ambulatory Visit: Payer: Self-pay

## 2019-11-13 ENCOUNTER — Ambulatory Visit: Payer: Managed Care, Other (non HMO) | Admitting: Physical Therapy

## 2019-11-13 DIAGNOSIS — I69151 Hemiplegia and hemiparesis following nontraumatic intracerebral hemorrhage affecting right dominant side: Secondary | ICD-10-CM

## 2019-11-13 DIAGNOSIS — R41841 Cognitive communication deficit: Secondary | ICD-10-CM

## 2019-11-13 DIAGNOSIS — R482 Apraxia: Secondary | ICD-10-CM | POA: Diagnosis not present

## 2019-11-13 DIAGNOSIS — R2689 Other abnormalities of gait and mobility: Secondary | ICD-10-CM

## 2019-11-13 DIAGNOSIS — I69219 Unspecified symptoms and signs involving cognitive functions following other nontraumatic intracranial hemorrhage: Secondary | ICD-10-CM

## 2019-11-13 DIAGNOSIS — M6281 Muscle weakness (generalized): Secondary | ICD-10-CM

## 2019-11-13 DIAGNOSIS — R4701 Aphasia: Secondary | ICD-10-CM

## 2019-11-13 DIAGNOSIS — R208 Other disturbances of skin sensation: Secondary | ICD-10-CM

## 2019-11-13 NOTE — Therapy (Signed)
[facility] [address] [place], [place], [address] Phone: [phone]   Fax:  [phone]  Physical Therapy Treatment  Patient Details  Name: Melanie Madden MRN: [id number] Date of Birth: Dec 29, 1965 Referring Provider (PT): Lauraine Rinne, [place]   Encounter Date: 11/13/2019  PT End of Session - 11/13/19 1457    Visit Number  20    Number of Visits  35    Date for PT Re-Evaluation  12/30/19    Authorization Type  [employer]- 60 VL combined PT/OT/ST - counts as 1 visit if seen on same day.  Not sure if 60 is hard max.  $50 copay each day - make sure PT/OT/ST on same day if possible    Authorization - Visit Number  --   PT/OT/ST days combined   Authorization - Number of Visits  60    PT Start Time  1018    PT Stop Time  1102    PT Time Calculation (min)  44 min    Equipment Utilized During Treatment  Gait belt   pt's R AFO; dance shoe cover to aid in foot clearance   Activity Tolerance  Patient tolerated treatment well    Behavior During Therapy  Impulsive       Past Medical History:  Diagnosis Date  . Allergy   . Anxiety   . Arthritis   . High triglycerides   . Kidney stones   . Migraines   . Recurrent sinus infections     Past Surgical History:  Procedure Laterality Date  . ANTERIOR CRUCIATE LIGAMENT REPAIR Left   . BUNIONECTOMY Right   . LASIK    . LITHOTRIPSY    . MENISCUS REPAIR Left   . NASAL SEPTUM SURGERY    . SHOULDER SURGERY    . VARICOSE VEIN SURGERY Left     There were no vitals filed for this visit.  Subjective Assessment - 11/13/19 1446    Subjective  No reported changes.  Caregiver is present, mostly in lobby today.    Patient is accompained by:  --   Caregivers   Pertinent History  anxiety, migraines, HTN    Patient Stated Goals  Per husband report they would like her to get stronger on right, improve mobility, and work on speech.    Currently in Pain?  No/denies                        [facility] Adult PT Treatment/Exercise - 11/13/19 0001      Bed Mobility   Bed Mobility  Sit to Supine;Supine to Sit    Supine to Sit  Supervision/Verbal cueing   VCs for RUE management   Sit to Supine  Supervision/Verbal cueing      Transfers   Transfers  Sit to Stand;Stand to Sit    Sit to Stand  4: Min assist    Sit to Stand Details  Tactile cues for weight shifting;Tactile cues for posture    Sit to Stand Details (indicate cue type and reason)  Pt wearing R AFO.    Five time sit to stand comments   50      Ambulation/Gait   Ambulation/Gait  Yes    Ambulation/Gait Assistance  3: Mod assist    Ambulation/Gait Assistance Details  Wearing R AFO and R hand orthosis on RW.  Therapist facilitates lateral weigthshift and assists with RLE hip/knee flexion for swing through.  VCs for slowed pace.  Tech present  to help slow pace of RW so pt stays within BOS of RW.  Utllized mirror in front of patient along straightaway in gym    Ambulation Distance (Feet)  50 Feet   x 8 reps; then 100 ft   Assistive device  Rolling walker   R handgrip placement   Gait Pattern  Step-to pattern;Step-through pattern;Decreased hip/knee flexion - right;Poor foot clearance - right;Right circumduction    Ambulation Surface  Level;Indoor      Neuro Re-ed    Neuro Re-ed Details   In parallel bars with mirror in front for visual cues, attempted L foot propped on 2" step, but pt tends to tap 2" step, at least 8 reps.  Pt holds with LUE on parallel bar, PT provides tactile cues to R gluts and R thighs for support through RLE.  VCs to slow pace of taps.  Then worked on LLE forward step, then return to midline, again, wtih therapist providing assistance at R glut/thigh for support, cues to slow pace of step.  From elevated stool in parallel bars, pt performed sit<>stand, with PT providing tactile cues through R quads for extension, weightbearing through RLE upon standing.  Performed 2 sets x 10  reps, with lessening tactile/manual cues on 2nd set of 10.      Knee/Hip Exercises: Supine   Bridges  Strengthening;Both;1 set;10 reps    Bridges Limitations  Provided support at knee throughout     Single Leg Bridge  Strengthening;Right;1 set;10 reps               PT Short Term Goals - 10/28/19 2028      PT SHORT TERM GOAL #1   Title  Pt will be able to perform all transfers CGA for improved mobility.    Time  4    Period  Weeks    Status  New    Target Date  12/02/19      PT SHORT TERM GOAL #2   Title  Pt will be able to maintain standing with 1 UE support x 5 min to assist with standing ADLs supervision.    Time  4    Period  Weeks    Status  New    Target Date  12/02/19      PT SHORT TERM GOAL #3   Title  Pt will ambulate >350' with min assist of 1 person with RW with right hand grip attachment so that husband can safely ambulate with her at home.    Time  4    Period  Weeks    Status  New    Target Date  12/02/19        PT Long Term Goals - 10/28/19 2031      PT LONG TERM GOAL #1   Title  Pt will be able to perform progressive HEP for strengthening, balance and mobility with assist of husband to continued gains at home.    Time  8    Period  Weeks    Status  On-going    Target Date  12/30/19      PT LONG TERM GOAL #2   Title  Pt will decrease 5 x sit to stand from 27 sec to <22 sec from edge of mat for improved balance and functional mobility.    Time  8    Period  Weeks    Status  New    Target Date  12/30/19      PT LONG TERM GOAL #3  Title  Pt will ambulate >400' CGA with RW with right hand grip attachment for improved gait ability in home.    Time  8    Period  Weeks    Status  New    Target Date  12/30/19            Plan - 11/13/19 1458    Clinical Impression Statement  Continued skilled PT in standing and gait training.  PT provides tactile assistance with standing pre-gait activities to facilitate improved RLE weightshifting and  control.  With gait, pt needs assistance for RLE swing through and placement, but is improving ability to take longer, more controlled steps with LLE.  She will continue to benefit from skilled PT to address strength, NMR and gait training for improved overall functional mobility.    Personal Factors and Comorbidities  Comorbidity 3+    Comorbidities  anxiety, migraines, HTN    Examination-Activity Limitations  Bathing;Bed Mobility;Stand;Locomotion Level;Squat;Stairs;Dressing;Transfers    Examination-Participation Restrictions  Community Activity;Driving;Shop;Laundry;Meal Prep    Stability/Clinical Decision Making  Evolving/Moderate complexity    Rehab Potential  Fair    PT Frequency  2x / week    PT Duration  8 weeks    PT Treatment/Interventions  ADLs/Self Care Home Management;Electrical Stimulation;DME Instruction;Gait training;Stair training;Functional mobility training;Therapeutic activities;Therapeutic exercise;Orthotic Fit/Training;Patient/family education;Neuromuscular re-education;Balance training;Manual techniques;Passive range of motion;Vestibular;Wheelchair mobility training    PT Next Visit Plan  [place] to front of right shoe to help slide. Continue with activities in standing to facilitate increased right weight shift, continue gait training with RW with right hand grip attachment.    Consulted and Agree with Plan of Care  Patient    Family Member Consulted  Caregiver, Aniceto Boss, present in lobby       Patient will benefit from skilled therapeutic intervention in order to improve the following deficits and impairments:  Abnormal gait, Decreased activity tolerance, Decreased balance, Decreased cognition, Decreased knowledge of use of DME, Decreased mobility, Decreased range of motion, Decreased safety awareness, Decreased strength, Impaired sensation  Visit Diagnosis: Muscle weakness (generalized)  Other abnormalities of gait and mobility     Problem List Patient Active Problem  List   Diagnosis Date Noted  . Nontraumatic subcortical hemorrhage of left cerebral hemisphere ([place]) 08/27/2019  . Acute blood loss anemia   . Hypoalbuminemia due to protein-calorie malnutrition ([place])   . Thrombocytopenia ([place])   . Dysphagia 07/29/2019  . Infarction of left basal ganglia ([place]) 07/18/2019  . Hypernatremia   . Leukocytosis   . Essential hypertension   . Global aphasia   . Cytotoxic brain edema ([place]) 07/10/2019  . ICH (intracerebral hemorrhage) ([place]) 07/09/2019  . Anxiety state 02/21/2015  . Cephalalgia 12/21/2014    Stepen Prins W. 11/13/2019, 3:01 PM  Frazier Butt., PT   [facility] [address] [place], [place], [address] Phone: [phone]   Fax:  [phone]  Name: Syvannah Prive MRN: [id number] Date of Birth: 1966-03-21

## 2019-11-13 NOTE — Therapy (Signed)
[facility] [address] [place], [place], [address] Phone: [phone]   Fax:  [phone]  Occupational Therapy Treatment  Patient Details  Name: Melanie Madden MRN: [id number] Date of Birth: 12-27-1965 Referring Provider (OT): Dr. Alysia Penna   Encounter Date: 11/13/2019  OT End of Session - 11/13/19 1347    Visit Number  20    Number of Visits  25    Date for OT Re-Evaluation  12/01/19    Authorization Type  [employer] 2021:  No prior auth OT, 60 DAY visit limit (regardless of how many therapies each day).    Authorization - Visit Number  23    Authorization - Number of Visits  75    OT Start Time  [id number]    OT Stop Time  1015    OT Time Calculation (min)  40 min    Activity Tolerance  Patient tolerated treatment well    Behavior During Therapy  Restless;Impulsive       Past Medical History:  Diagnosis Date  . Allergy   . Anxiety   . Arthritis   . High triglycerides   . Kidney stones   . Migraines   . Recurrent sinus infections     Past Surgical History:  Procedure Laterality Date  . ANTERIOR CRUCIATE LIGAMENT REPAIR Left   . BUNIONECTOMY Right   . LASIK    . LITHOTRIPSY    . MENISCUS REPAIR Left   . NASAL SEPTUM SURGERY    . SHOULDER SURGERY    . VARICOSE VEIN SURGERY Left     There were no vitals filed for this visit.  Subjective Assessment - 11/13/19 1339    Subjective   pt denies pain (able to shake head no)    Patient is accompanied by:  --   aide, husband   Pertinent History  Subcortical hemorrhage of R cerbral hemisphere.  PMH:  HTN, anxiety, migraines, hx of shoulder injection approx 2 weeks ago for R shoulder pain, hx of R shoulder surgery (arthroscopic)    Limitations  aphasia, fall risk, impulsive (husband reports hx of OCD)    Patient Stated Goals  husband reports: to be able to care for herself more (pt indicates that she onces to return to previous activities per  questioning/gestures)    Currently in Pain?  No/denies       Session performed in private room today (minimized external distractions).  Pt able to doff jacket (with band at wrist) with min A to stabilize while she pulled out RUE (assist due to frustration).  Pt able to doff fleece mod I.  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  transfer w/c>mat with min A.    In sitting,  light wt bearing with body on arm movements (LUE reach to targets) to incr activation RUE/trunk with min-mod facilitation and cueing for impulsivity.    Light wt. Bearing/AAROM shoulder flex/elbow ext with tilted stool with min-mod facilitation initially, then without facilitation/min cueing only.  Then while NMES x15 min (50pps, 250 pulse width, 10sec cycle, 2 sec ramp, intensity =38, with no adverse reactions) and while performing bilateral low-range functional reach for RUE stabilization/AAROM from in small range with min facilitation and cueing for normal movement patterns/impulsivity (in sitting).  Attempted wt. Bearing to R side on elbow with push to sitting with mod facilitation (pt used LUE as assist).  Therapist assisted low-range supported reach AAROM to grasp cylinder object with mod facilitation.  Then holding/stabilizing small cylinder object  in hand inconsistently with cueing and with min facilitation to release.     OT Short Term Goals - 11/11/19 1533      OT SHORT TERM GOAL #1   Title  Pt/husband will be independent with initial HEP for RUE ROM--check STGs 10/14/19    Time  6    Period  Weeks    Status  Achieved      OT SHORT TERM GOAL #2   Title  Pt will perform UB dressing with min A.    Time  6    Period  Weeks    Status  On-going   MOD ASSIST.  11/11/19:  pt able to don/doff zip-up fleece mod I     OT SHORT TERM GOAL #3   Title  Pt will perform UB bathing with mod A.    Time  6    Period  Weeks    Status  Achieved      OT SHORT TERM GOAL #4   Title  Pt will be able to  brush hair with set-up.    Time  6    Period  Weeks    Status  On-going   fluctuating assist needed     OT SHORT TERM GOAL #5   Title  Pt will demo at least 110* shoulder flex PROM in supine without pain for incr ease for ADLs.    Time  6    Period  Weeks    Status  Achieved        OT Long Term Goals - 09/02/19 1229      OT LONG TERM GOAL #1   Title  Pt/husband will be independent with HEP for functional activities for incr participation in IADLs/leisure tasks and for cognition (and incorporating RUE if able).--check LTGs 12/01/19    Time  12    Period  Weeks    Status  New      OT LONG TERM GOAL #2   Title  Pt will perform UB dressing with supervision.    Time  12    Period  Weeks    Status  New      OT LONG TERM GOAL #3   Title  Pt will perform LB dressing with mod A.    Time  12    Period  Weeks    Status  New      OT LONG TERM GOAL #4   Title  Pt will perform UB bathing with min A.    Time  12    Period  Weeks    Status  New      OT LONG TERM GOAL #5   Title  Pt will perform simple cold snack prep from w/c level with min A/set up.    Time  12    Period  Weeks    Status  New      OT LONG TERM GOAL #6   Title  Pt will perform toileting with mod A.    Time  12    Period  Weeks    Status  New            Plan - 11/13/19 1348    Clinical Impression Statement  Pt inconsistent in ability to activate RUE in close-chain activities initially, but improves with repetition and is dependent on frustration level.  Pt continues to be impulsive at times, particularly when frustrated.    Occupational performance deficits (Please refer to evaluation for details):  ADL's;IADL's;Leisure;Work;Social Participation  Body Structure / Function / Physical Skills  ADL;ROM;IADL;Sensation;Mobility;Strength;Tone;UE functional use;Decreased knowledge of precautions;Decreased knowledge of use of DME;Coordination;Balance;FMC;GMC    Cognitive Skills   Attention;Temperament/Personality;Understand;Thought;Safety Awareness    OT Frequency  2x / week    OT Duration  12 weeks    OT Treatment/Interventions  Self-care/ADL training;Moist Heat;DME and/or AE instruction;Splinting;Therapeutic activities;Aquatic Therapy;Cognitive remediation/compensation;Therapeutic exercise;Cryotherapy;Neuromuscular education;Functional Mobility Training;Passive range of motion;Visual/perceptual remediation/compensation;Patient/family education;Manual Therapy;Electrical Stimulation;Fluidtherapy    Plan  continue to address ADLs as able, neuro re-ed including use of tilted stool and wt. bearing, closed chain activities    Consulted and Agree with Plan of Care  Patient;Family member/caregiver    Family Member Consulted  husband       Patient will benefit from skilled therapeutic intervention in order to improve the following deficits and impairments:   Body Structure / Function / Physical Skills: ADL, ROM, IADL, Sensation, Mobility, Strength, Tone, UE functional use, Decreased knowledge of precautions, Decreased knowledge of use of DME, Coordination, Balance, FMC, GMC Cognitive Skills: Attention, Temperament/Personality, Understand, Thought, Safety Awareness     Visit Diagnosis: Hemiplegia and hemiparesis following nontraumatic intracerebral hemorrhage affecting right dominant side (HCC)  Muscle weakness (generalized)  Other abnormalities of gait and mobility  Other disturbances of skin sensation  Unspecified symptoms and signs involving cognitive functions following other nontraumatic intracranial hemorrhage    Problem List Patient Active Problem List   Diagnosis Date Noted  . Nontraumatic subcortical hemorrhage of left cerebral hemisphere ([facility] [place]) 08/27/2019  . Acute blood loss anemia   . Hypoalbuminemia due to protein-calorie malnutrition ([facility])   . Thrombocytopenia ([facility])   . Dysphagia 07/29/2019  . Infarction of left basal ganglia ([facility]) 07/18/2019  .  Hypernatremia   . Leukocytosis   . Essential hypertension   . Global aphasia   . Cytotoxic brain edema ([place]) 07/10/2019  . ICH (intracerebral hemorrhage) ([place]) 07/09/2019  . Anxiety state 02/21/2015  . Cephalalgia 12/21/2014    [facility] 11/13/2019, 1:50 PM  [facility] [address] [place], [place], [address] Phone: [phone]   Fax:  [phone]  Name: Zoriyah Wa MRN: [id number] Date of Birth: 1966-06-11   Vianne Bulls, OTR/L [facility] [address] [place], [place]  [address] [phone] phone [phone] 11/13/19 1:50 PM

## 2019-11-13 NOTE — Therapy (Signed)
[facility] [address] [place], [place], [address] Phone: [phone]   Fax:  [phone]  Speech Language Pathology Treatment  Patient Details  Name: Melanie Madden MRN: [id number] Date of Birth: 1965/10/19 Referring Provider (SLP): Alysia Penna, MD   Encounter Date: 11/13/2019  End of Session - 11/13/19 1647    Visit Number  21    Number of Visits  25    Date for SLP Re-Evaluation  12/04/19    SLP Start Time  1103    SLP Stop Time   1145    SLP Time Calculation (min)  42 min    Activity Tolerance  Patient tolerated treatment well       Past Medical History:  Diagnosis Date  . Allergy   . Anxiety   . Arthritis   . High triglycerides   . Kidney stones   . Migraines   . Recurrent sinus infections     Past Surgical History:  Procedure Laterality Date  . ANTERIOR CRUCIATE LIGAMENT REPAIR Left   . BUNIONECTOMY Right   . LASIK    . LITHOTRIPSY    . MENISCUS REPAIR Left   . NASAL SEPTUM SURGERY    . SHOULDER SURGERY    . VARICOSE VEIN SURGERY Left     There were no vitals filed for this visit.  Subjective Assessment - 11/13/19 1110    Subjective  Pt arrives alone, without Lingraphica (?).    Currently in Pain?  No/denies            ADULT SLP TREATMENT - 11/13/19 1111      General Information   Behavior/Cognition  Alert;Cooperative      Treatment Provided   Treatment provided  Cognitive-Linquistic      Cognitive-Linquistic Treatment   Treatment focused on  Apraxia;Aphasia    Skilled Treatment  SLP worked with pt on reading comprehension - 8/10 phrase (f:4) matched with an object. SLP assisted pt in very basic programming; cleaning up restaurants and stores pt will not utilize - initial mod cues faded to min A rarely and for impulsivity as pt deleted the whole restaurants folder. SLP noted aid Aniceto Boss) was did not take active interest in ST session - was present for last 7-10 minutes of  session.       Assessment / Recommendations / Plan   Plan  Continue with current plan of care      Progression Toward Goals   Progression toward goals  Progressing toward goals       SLP Education - 11/13/19 1647    Education Details  basic programming    Person(s) Educated  Patient    Methods  Explanation;Demonstration;Verbal cues       SLP Short Term Goals - 10/07/19 1239      SLP SHORT TERM GOAL #1   Title  pt will simultaneously with SLP produce 3 different phonemes 50% of the time over three sessions    Status  Partially Met      SLP SHORT TERM GOAL #2   Title  pt will ID objects salient to pt in f:4 80% of the time over 3 sessions    Status  Deferred      SLP SHORT TERM GOAL #3   Title  pt will write her full name on first attempt over three sessions    Time  1    Status  Deferred      SLP SHORT TERM GOAL #4   Title  pt will attempt expressive communication via multimodal means during 5 sessions    Status  Not Met      SLP SHORT TERM GOAL #5   Title  pt will demonstrate undertanding of simple 1-step functional commands with 70% accuracy with multimodal cues over 3 sessions    Status  Not Met      SLP SHORT TERM GOAL #6   Title  pt will look at object or line drawing and match the correct word from f:6 words 90% success over 3 sessions    Status  Deferred       SLP Long Term Goals - 11/13/19 1648      SLP LONG TERM GOAL #1   Title  pt's diet will be appropriately upgraded as needed by SLP    Time  2    Period  Weeks   or 25 total visits, for all LTGs   Status  On-going      SLP LONG TERM GOAL #2   Title  pt will produce 3 simple words/words pertinent to pt (family or pet names, etc) simultaneously with SLP and multimodal cues over three sessions    Time  2    Period  Weeks    Status  On-going      SLP LONG TERM GOAL #3   Title  pt will demo understanding of simple yes/no questions pertinent to desires/wants with multimodal cues 90% of the time over 3  sessions    Time  2    Period  Weeks    Status  On-going      SLP LONG TERM GOAL #4   Title  a device trial with a speech generating device will be initiated within the first 20 ST visits    Status  Achieved      SLP LONG TERM GOAL #5   Title  pt will demo recognition of written word for a common object 100% success over 3 sessions    Baseline  11-13-19    Time  2    Period  Weeks    Status  On-going      SLP LONG TERM GOAL #6   Title  pt will work on speech-related tasks at home between 75% of sessions as reported by family and/or noted on [place] website    Baseline  10-21-19, 10-26-19, 10-28-19, 11-11-19, 11-13-19    Time  2    Period  Weeks    Status  On-going       Plan - 11/13/19 1648    Clinical Impression Statement  Cont with no complaints with dys III diet/thin, to SLP knowledge. Pt WILL NEED TO COMPLY with daily work at home for ST to be most effective. Pt cont to demo cognitive communication deficits in at least the areas of attention (incr'd impulsivity) and awareness (appears mostly unaware of verbal deficits vs/in addition to receptive languge deficit). Pt cont to present with a severe/profound expressive aphasia with verbal apraxia, and severe/profound receptive aphasia ("global aphasia"). A device trial with Lingraphica began today.See "skilled treatment" for more details of today's session. She would cont to benefit from skilled ST focusing on incr'ing communicative effectiveness via multimodal means and incr'ing functional communication (verbal or nonverbal), incr'ing cognition in functional ways as able given pt's language deficit, and monitoring/upgrading pt's diet as appropriate.Pt knows she will need to participate at home for therapy here to be of any assistance.    Speech Therapy Frequency  2x / week  Duration  --   8 weeks   Treatment/Interventions  Diet toleration management by SLP;Trials of upgraded texture/liquids;Language  facilitation;Internal/external aids;Cognitive reorganization;Cueing hierarchy;SLP instruction and feedback;Patient/family education;Compensatory strategies;Multimodal communcation approach;Functional tasks;Environmental controls    Potential to Achieve Goals  Fair    Potential Considerations  Severity of impairments;Cooperation/participation level       Patient will benefit from skilled therapeutic intervention in order to improve the following deficits and impairments:   Verbal apraxia  Aphasia  Cognitive communication deficit    Problem List Patient Active Problem List   Diagnosis Date Noted  . Nontraumatic subcortical hemorrhage of left cerebral hemisphere ([place]) 08/27/2019  . Acute blood loss anemia   . Hypoalbuminemia due to protein-calorie malnutrition ([facility])   . Thrombocytopenia ([place])   . Dysphagia 07/29/2019  . Infarction of left basal ganglia ([place]) 07/18/2019  . Hypernatremia   . Leukocytosis   . Essential hypertension   . Global aphasia   . Cytotoxic brain edema ([place]) 07/10/2019  . ICH (intracerebral hemorrhage) ([place]) 07/09/2019  . Anxiety state 02/21/2015  . Cephalalgia 12/21/2014    [facility] ,[place], [place]  11/13/2019, 4:49 PM  [facility] [address] [place], [place], [address] Phone: [phone]   Fax:  [phone]   Name: Melanie Madden MRN: [id number] Date of Birth: 08-05-1965

## 2019-11-17 ENCOUNTER — Encounter: Payer: Self-pay | Admitting: Occupational Therapy

## 2019-11-17 ENCOUNTER — Ambulatory Visit: Payer: Managed Care, Other (non HMO) | Admitting: Occupational Therapy

## 2019-11-17 ENCOUNTER — Ambulatory Visit: Payer: Managed Care, Other (non HMO)

## 2019-11-17 ENCOUNTER — Other Ambulatory Visit: Payer: Self-pay

## 2019-11-17 DIAGNOSIS — R1310 Dysphagia, unspecified: Secondary | ICD-10-CM

## 2019-11-17 DIAGNOSIS — I69219 Unspecified symptoms and signs involving cognitive functions following other nontraumatic intracranial hemorrhage: Secondary | ICD-10-CM

## 2019-11-17 DIAGNOSIS — M6281 Muscle weakness (generalized): Secondary | ICD-10-CM

## 2019-11-17 DIAGNOSIS — R208 Other disturbances of skin sensation: Secondary | ICD-10-CM

## 2019-11-17 DIAGNOSIS — R2689 Other abnormalities of gait and mobility: Secondary | ICD-10-CM

## 2019-11-17 DIAGNOSIS — R482 Apraxia: Secondary | ICD-10-CM | POA: Diagnosis not present

## 2019-11-17 DIAGNOSIS — R41841 Cognitive communication deficit: Secondary | ICD-10-CM

## 2019-11-17 DIAGNOSIS — R4701 Aphasia: Secondary | ICD-10-CM

## 2019-11-17 DIAGNOSIS — I69151 Hemiplegia and hemiparesis following nontraumatic intracerebral hemorrhage affecting right dominant side: Secondary | ICD-10-CM

## 2019-11-17 NOTE — Therapy (Signed)
[facility] [address] [place], [place], [address] Phone: [phone]   Fax:  [phone]  Speech Language Pathology Treatment  Patient Details  Name: Melanie Madden MRN: [id number] Date of Birth: Nov 28, 1965 Referring Provider (SLP): Alysia Penna, MD   Encounter Date: 11/17/2019  End of Session - 11/17/19 2322    Visit Number  22    Number of Visits  25    Date for SLP Re-Evaluation  12/04/19    Authorization Type  cigna    Authorization - Number of Visits  34    SLP Start Time  1215    SLP Stop Time   1232    SLP Time Calculation (min)  17 min    Activity Tolerance  Patient tolerated treatment well       Past Medical History:  Diagnosis Date  . Allergy   . Anxiety   . Arthritis   . High triglycerides   . Kidney stones   . Migraines   . Recurrent sinus infections     Past Surgical History:  Procedure Laterality Date  . ANTERIOR CRUCIATE LIGAMENT REPAIR Left   . BUNIONECTOMY Right   . LASIK    . LITHOTRIPSY    . MENISCUS REPAIR Left   . NASAL SEPTUM SURGERY    . SHOULDER SURGERY    . VARICOSE VEIN SURGERY Left     There were no vitals filed for this visit.  Subjective Assessment - 11/17/19 2259    Subjective  Arrived 1215 - schedule mix-up.    Patient is accompained by:  Family member   and aide   Currently in Pain?  No/denies            ADULT SLP TREATMENT - 11/17/19 2301      General Information   Behavior/Cognition  Alert;Cooperative      Treatment Provided   Treatment provided  Cognitive-Linquistic      Cognitive-Linquistic Treatment   Treatment focused on  Aphasia;Apraxia    Skilled Treatment  Pt brought [name] - showed SLP new icons made in the last 3 days. Pt switching from page to page without fully looking at the entire page - SLP req'd to provide usual cues for decr'ing impulsivity operating the [name]. Educated husband on saving data to the cloud.        Assessment / Recommendations / Plan   Plan  Continue with current plan of care      Progression Toward Goals   Progression toward goals  Progressing toward goals       SLP Education - 11/17/19 2321    Education Details  saving to the cloud    Person(s) Educated  Patient;Spouse    Methods  Explanation    Comprehension  Verbalized understanding       SLP Short Term Goals - 10/07/19 1239      SLP SHORT TERM GOAL #1   Title  pt will simultaneously with SLP produce 3 different phonemes 50% of the time over three sessions    Status  Partially Met      SLP SHORT TERM GOAL #2   Title  pt will ID objects salient to pt in f:4 80% of the time over 3 sessions    Status  Deferred      SLP SHORT TERM GOAL #3   Title  pt will write her full name on first attempt over three sessions    Time  1    Status  Deferred  SLP SHORT TERM GOAL #4   Title  pt will attempt expressive communication via multimodal means during 5 sessions    Status  Not Met      SLP SHORT TERM GOAL #5   Title  pt will demonstrate undertanding of simple 1-step functional commands with 70% accuracy with multimodal cues over 3 sessions    Status  Not Met      SLP SHORT TERM GOAL #6   Title  pt will look at object or line drawing and match the correct word from f:6 words 90% success over 3 sessions    Status  Deferred       SLP Long Term Goals - 11/17/19 2323      SLP LONG TERM GOAL #1   Title  pt's diet will be appropriately upgraded as needed by SLP    Time  1    Period  Weeks   or 25 total visits, for all LTGs   Status  On-going      SLP LONG TERM GOAL #2   Title  pt will produce 3 simple words/words pertinent to pt (family or pet names, etc) simultaneously with SLP and multimodal cues over three sessions    Time  1    Period  Weeks    Status  On-going      SLP LONG TERM GOAL #3   Title  pt will demo understanding of simple yes/no questions pertinent to desires/wants with multimodal cues 90% of the  time over 3 sessions    Time  1    Period  Weeks    Status  On-going      SLP LONG TERM GOAL #4   Title  a device trial with a speech generating device will be initiated within the first 20 ST visits    Status  Achieved      SLP LONG TERM GOAL #5   Title  pt will demo recognition of written word for a common object 100% success over 3 sessions    Baseline  11-13-19    Time  1    Period  Weeks    Status  On-going      SLP LONG TERM GOAL #6   Title  pt will work on speech-related tasks at home between 75% of sessions as reported by family and/or noted on [name] website    Baseline  10-21-19, 10-26-19, 10-28-19, 11-11-19, 11-13-19    Time  2    Period  Weeks    Status  On-going       Plan - 11/17/19 2323    Clinical Impression Statement  Cont with no complaints with dys III diet/thin, to SLP knowledge. Pt WILL NEED TO COMPLY with daily work at home for ST to be most effective. Pt cont to demo cognitive communication deficits in at least the areas of attention (incr'd impulsivity) and awareness (appears mostly unaware of verbal deficits vs/in addition to receptive languge deficit). Pt cont to present with a severe/profound expressive aphasia with verbal apraxia, and severe/profound receptive aphasia ("global aphasia"). A device trial with Lingraphica began today.See "skilled treatment" for more details of today's session. She would cont to benefit from skilled ST focusing on incr'ing communicative effectiveness via multimodal means and incr'ing functional communication (verbal or nonverbal), incr'ing cognition in functional ways as able given pt's language deficit, and monitoring/upgrading pt's diet as appropriate.Pt knows she will need to participate at home for therapy here to be of any assistance.  Speech Therapy Frequency  2x / week    Duration  --   8 weeks   Treatment/Interventions  Diet toleration management by SLP;Trials of upgraded texture/liquids;Language  facilitation;Internal/external aids;Cognitive reorganization;Cueing hierarchy;SLP instruction and feedback;Patient/family education;Compensatory strategies;Multimodal communcation approach;Functional tasks;Environmental controls    Potential to Achieve Goals  Fair    Potential Considerations  Severity of impairments;Cooperation/participation level       Patient will benefit from skilled therapeutic intervention in order to improve the following deficits and impairments:   Verbal apraxia  Aphasia  Cognitive communication deficit  Dysphagia, unspecified type    Problem List Patient Active Problem List   Diagnosis Date Noted  . Nontraumatic subcortical hemorrhage of left cerebral hemisphere ([place]) 08/27/2019  . Acute blood loss anemia   . Hypoalbuminemia due to protein-calorie malnutrition ([place])   . Thrombocytopenia ([place])   . Dysphagia 07/29/2019  . Infarction of left basal ganglia ([place]) 07/18/2019  . Hypernatremia   . Leukocytosis   . Essential hypertension   . Global aphasia   . Cytotoxic brain edema ([place]) 07/10/2019  . ICH (intracerebral hemorrhage) ([place]) 07/09/2019  . Anxiety state 02/21/2015  . Cephalalgia 12/21/2014    [facility] ,[place], [place]  11/17/2019, 11:24 PM  [facility] [address] [place], [place], [address] Phone: [phone]   Fax:  [phone]   Name: Melanie Madden MRN: [id number] Date of Birth: November 19, 1965

## 2019-11-17 NOTE — Therapy (Signed)
[facility] [address] [place], [place], [address] Phone: [phone]   Fax:  [phone]  Physical Therapy Treatment  Patient Details  Name: Melanie Madden MRN: [id number] Date of Birth: [birth year] Referring Provider (PT): Lauraine Rinne, [place]   Encounter Date: 11/17/2019  PT End of Session - 11/17/19 1320    Visit Number  21    Number of Visits  35    Date for PT Re-Evaluation  12/30/19    Authorization Type  [employer]- 60 VL combined PT/OT/ST - counts as 1 visit if seen on same day.  Not sure if 60 is hard max.  $50 copay each day - make sure PT/OT/ST on same day if possible    Authorization - Visit Number  --   PT/OT/ST days combined   Authorization - Number of Visits  60    PT Start Time  1316    PT Stop Time  1403    PT Time Calculation (min)  47 min    Equipment Utilized During Treatment  Gait belt   pt's R AFO; dance shoe cover to aid in foot clearance   Activity Tolerance  Patient tolerated treatment well    Behavior During Therapy  Impulsive       Past Medical History:  Diagnosis Date  . Allergy   . Anxiety   . Arthritis   . High triglycerides   . Kidney stones   . Migraines   . Recurrent sinus infections     Past Surgical History:  Procedure Laterality Date  . ANTERIOR CRUCIATE LIGAMENT REPAIR Left   . BUNIONECTOMY Right   . LASIK    . LITHOTRIPSY    . MENISCUS REPAIR Left   . NASAL SEPTUM SURGERY    . SHOULDER SURGERY    . VARICOSE VEIN SURGERY Left     There were no vitals filed for this visit.  Subjective Assessment - 11/17/19 1320    Subjective  Pt reports no changes. Denies any falls. Has husband and caregiver present. Has been walking some with hemiwalker at home.    Patient is accompained by:  --   Caregivers   Pertinent History  anxiety, migraines, HTN    Patient Stated Goals  Per husband report they would like her to get stronger on right, improve mobility, and work on speech.     Currently in Pain?  No/denies                       [facility] Adult PT Treatment/Exercise - 11/17/19 1321      Transfers   Transfers  Sit to Stand;Stand to Sit    Sit to Stand  4: Min guard;4: Min assist    Sit to Stand Details  Verbal cues for technique    Stand to Sit  4: Min guard    Stand to Sit Details (indicate cue type and reason)  Verbal cues for precautions/safety    Stand to Sit Details  PT assisted to remove right hand from walker.      Ambulation/Gait   Ambulation/Gait  Yes    Ambulation/Gait Assistance  3: Mod assist;4: Min assist    Ambulation/Gait Assistance Details  Pt ambulated with RW with right hand grip attachment with min/mod assist at RLE to help advance. Pt was given verbal cues and visual cues in mirror on straightaway to try to get more right trunk shift. PT tech assisting to guide walker. Pt then ambulated 115' with  hemiwalker with patient showing PT what she has been doing at home with her holding right hand under left hand and hemiwalker positioned more in front. Pt was able to advance RLE on own with CGA/min assist but pt leaning more to the left despite tactile facilitation and visual cues in mirror to try to straighten more. PT also concerned with wrist position with holding hand under left hand. PT educated patient and husband on purpose of using walker to try to facilitate more weight shift to right and better quality of  right leg advancement instead of just swinging with trunk at this time, not wanting to give in to compensatory techniques yet. PT advised that if they walk at home with hemiwalker to use givmore sling to protect arm/shoulder. PT still waiting on right hand grip attachment to arrive to give them.    Ambulation Distance (Feet)  115 Feet   first bout with RW and 2nd bout 115' with hemiwalker   Assistive device  Rolling walker;Hemi-walker   right hand grip attachment, right AFO   Gait Pattern  Step-to pattern;Step-through  pattern;Decreased stance time - right;Decreased hip/knee flexion - right;Right circumduction    Ambulation Surface  Level;Indoor      Neuro Re-ed    Neuro Re-ed Details   Pt performed standing at walker with right hand grip attachment with mirror in front working on weight shifting to right trying to get patient to facilitate more from shoulders. PT giving tactile cues to help facilitate as well. Then with PT standing in front of patient having her try to mimic PT upright posture with bring shoulders more to right with PT adjusting right leg in proper position. Standing at walking with stepping forward and back with LLE toward PT foot as target with PT stabilizing at right knee x 10 then x 10 with cues to go slow and try to hold a couple seconds. Pt had trouble with trying to slow down.  Kicking ball with right foot to try to facilitate right leg advancement x 10. Standing at walker with reaching for targets with left UE in varied directions but mostly across body with PT helping to facilitate at times trying to get more right weight shift.             PT Education - 11/17/19 1839    Education Details  Education on importance of supporting right arm when up and advised to wear givmore sling when tries to walk with hemiwalker at home until gets hand grip attachment for walker.    Person(s) Educated  Patient;Spouse    Methods  Explanation    Comprehension  Verbalized understanding       PT Short Term Goals - 10/28/19 2028      PT SHORT TERM GOAL #1   Title  Pt will be able to perform all transfers CGA for improved mobility.    Time  4    Period  Weeks    Status  New    Target Date  12/02/19      PT SHORT TERM GOAL #2   Title  Pt will be able to maintain standing with 1 UE support x 5 min to assist with standing ADLs supervision.    Time  4    Period  Weeks    Status  New    Target Date  12/02/19      PT SHORT TERM GOAL #3   Title  Pt will ambulate >350' with min assist of 1  person with RW with right hand grip attachment so that husband can safely ambulate with her at home.    Time  4    Period  Weeks    Status  New    Target Date  12/02/19        PT Long Term Goals - 10/28/19 2031      PT LONG TERM GOAL #1   Title  Pt will be able to perform progressive HEP for strengthening, balance and mobility with assist of husband to continued gains at home.    Time  8    Period  Weeks    Status  On-going    Target Date  12/30/19      PT LONG TERM GOAL #2   Title  Pt will decrease 5 x sit to stand from 27 sec to <22 sec from edge of mat for improved balance and functional mobility.    Time  8    Period  Weeks    Status  New    Target Date  12/30/19      PT LONG TERM GOAL #3   Title  Pt will ambulate >400' CGA with RW with right hand grip attachment for improved gait ability in home.    Time  8    Period  Weeks    Status  New    Target Date  12/30/19            Plan - 11/17/19 1840    Clinical Impression Statement  PT trialed hemiwalker that patient is using at home some but patient with heavy left trunk lean and concerned with holding right hand under left for wrist. Advised to use givmore sling to protect and will continue to work more on walker for better right weight shift.    Personal Factors and Comorbidities  Comorbidity 3+    Comorbidities  anxiety, migraines, HTN    Examination-Activity Limitations  Bathing;Bed Mobility;Stand;Locomotion Level;Squat;Stairs;Dressing;Transfers    Examination-Participation Restrictions  Community Activity;Driving;Shop;Laundry;Meal Prep    Stability/Clinical Decision Making  Evolving/Moderate complexity    Rehab Potential  Fair    PT Frequency  2x / week    PT Duration  8 weeks    PT Treatment/Interventions  ADLs/Self Care Home Management;Electrical Stimulation;DME Instruction;Gait training;Stair training;Functional mobility training;Therapeutic activities;Therapeutic exercise;Orthotic Fit/Training;Patient/family  education;Neuromuscular re-education;Balance training;Manual techniques;Passive range of motion;Vestibular;Wheelchair mobility training    PT Next Visit Plan  Danceskin to front of right shoe to help slide. Continue with activities in standing to facilitate increased right weight shift, continue gait training with RW with right hand grip attachment.    Consulted and Agree with Plan of Care  Patient    Family Member Consulted  Caregiver, Aniceto Boss, present in lobby       Patient will benefit from skilled therapeutic intervention in order to improve the following deficits and impairments:  Abnormal gait, Decreased activity tolerance, Decreased balance, Decreased cognition, Decreased knowledge of use of DME, Decreased mobility, Decreased range of motion, Decreased safety awareness, Decreased strength, Impaired sensation  Visit Diagnosis: Muscle weakness (generalized)  Other abnormalities of gait and mobility     Problem List Patient Active Problem List   Diagnosis Date Noted  . Nontraumatic subcortical hemorrhage of left cerebral hemisphere ([place]) 08/27/2019  . Acute blood loss anemia   . Hypoalbuminemia due to protein-calorie malnutrition ([place])   . Thrombocytopenia ([place])   . Dysphagia 07/29/2019  . Infarction of left basal ganglia ([place]) 07/18/2019  . Hypernatremia   . Leukocytosis   .  Essential hypertension   . Global aphasia   . Cytotoxic brain edema ([place]) 07/10/2019  . ICH (intracerebral hemorrhage) ([place]) 07/09/2019  . Anxiety state 02/21/2015  . Cephalalgia 12/21/2014    Electa Sniff, PT, DPT, NCS 11/17/2019, [facility] [address] [place], [place], [address] Phone: [phone]   Fax:  [phone]  Name: Melanie Madden MRN: [id number] Date of Birth: Nov 09, 1965

## 2019-11-17 NOTE — Therapy (Signed)
[facility] [address] [place], [place], [address] Phone: [phone]   Fax:  [phone]  Occupational Therapy Treatment  Patient Details  Name: Melanie Madden MRN: [id number] Date of Birth: [birth year] Referring Provider (OT): Dr. Alysia Penna   Encounter Date: 11/17/2019  OT End of Session - 11/17/19 1341    Visit Number  21    Number of Visits  25    Date for OT Re-Evaluation  12/01/19    Authorization Type  [employer] 2021:  No prior auth OT, 60 DAY visit limit (regardless of how many therapies each day).    Authorization - Visit Number  24    Authorization - Number of Visits  60    OT Start Time  8115    OT Stop Time  1316    OT Time Calculation (min)  41 min    Activity Tolerance  Patient tolerated treatment well    Behavior During Therapy  Restless;Impulsive       Past Medical History:  Diagnosis Date  . Allergy   . Anxiety   . Arthritis   . High triglycerides   . Kidney stones   . Migraines   . Recurrent sinus infections     Past Surgical History:  Procedure Laterality Date  . ANTERIOR CRUCIATE LIGAMENT REPAIR Left   . BUNIONECTOMY Right   . LASIK    . LITHOTRIPSY    . MENISCUS REPAIR Left   . NASAL SEPTUM SURGERY    . SHOULDER SURGERY    . VARICOSE VEIN SURGERY Left     There were no vitals filed for this visit.  Subjective Assessment - 11/17/19 1320    Subjective   pt gestures that she is upset about the schedule mix up (late for ST).  Husband reports that pt is dressing with min A, bathing with min A, shower transfer with supervision, brushing hair some    Patient is accompanied by:  --   aide, husband   Pertinent History  Subcortical hemorrhage of R cerbral hemisphere.  PMH:  HTN, anxiety, migraines, hx of shoulder injection approx 2 weeks ago for R shoulder pain, hx of R shoulder surgery (arthroscopic)    Limitations  aphasia, fall risk, impulsive (husband reports hx of OCD)    Patient  Stated Goals  husband reports: to be able to care for herself more (pt indicates that she onces to return to previous activities per questioning/gestures)    Currently in Pain?  No/denies       Discussed progress with ADLs with husband--see goals section/subjective.  Pt able to doff fleece mod I.   transfer w/c>mat with CGA.    In sitting,  light wt bearing with body on arm movements (LUE reach to targets) to incr activation RUE/trunk with min facilitation and cueing normal movement patterns--incr consistency today.  Then pushing through therapist's knee with forward reach LUE.    Light wt. Bearing/AAROM shoulder flex/elbow ext with tilted stool with min-mod facilitation initially, then without facilitation/min cueing only (improved with repetition).  AAROM shoulder IR/ER with UE ranger with min-mod facilitation/cueing.  Therapist assisted low-range supported reach AAROM to grasp cylinder object with mod facilitation.  Then holding/stabilizing small cylinder object in hand consistently with cueing and with min facilitation to release.   In sitting, bilateral low-range functional reach for RUE stabilization/AAROM from in small range with min facilitation and cueing for normal movement patterns/impulsivity initially, then, with repetition after set-up, pt able to stabilize RUE on  ball (except 2-3 episodes of difficulty due to attention).  Pt reported pain in low back with repetition, but that it resolved after rest.     Scapular retraction bilaterally with light wt bearing on mat/UEs positioned in ER as able with min facilitation/cueing.     OT Short Term Goals - 11/17/19 1321      OT SHORT TERM GOAL #1   Title  Pt/husband will be independent with initial HEP for RUE ROM--check STGs 10/14/19    Time  6    Period  Weeks    Status  Achieved      OT SHORT TERM GOAL #2   Title  Pt will perform UB dressing with min A.    Time  6    Period  Weeks    Status  Achieved   MOD ASSIST.   11/11/19:  pt able to don/doff zip-up fleece mod I.  11/17/19:  met per husband report     OT SHORT TERM GOAL #3   Title  Pt will perform UB bathing with mod A.    Time  6    Period  Weeks    Status  Achieved      OT SHORT TERM GOAL #4   Title  Pt will be able to brush hair with set-up.    Time  6    Period  Weeks    Status  On-going   fluctuating assist needed     OT SHORT TERM GOAL #5   Title  Pt will demo at least 110* shoulder flex PROM in supine without pain for incr ease for ADLs.    Time  6    Period  Weeks    Status  Achieved        OT Long Term Goals - 11/17/19 1322      OT LONG TERM GOAL #1   Title  Pt/husband will be independent with HEP for functional activities for incr participation in IADLs/leisure tasks and for cognition (and incorporating RUE if able).--check LTGs 12/01/19    Time  12    Period  Weeks    Status  New      OT LONG TERM GOAL #2   Title  Pt will perform UB dressing with supervision.    Time  12    Period  Weeks    Status  On-going      OT LONG TERM GOAL #3   Title  Pt will perform LB dressing with mod A.    Time  12    Period  Weeks    Status  Achieved   11/17/19:  min A per husband report     OT LONG TERM GOAL #4   Title  Pt will perform UB bathing with min A.    Time  12    Period  Weeks    Status  Achieved   11/17/19:  met per husband report     OT LONG TERM GOAL #5   Title  Pt will perform simple cold snack prep from w/c level with min A/set up.    Time  12    Period  Weeks    Status  New      OT LONG TERM GOAL #6   Title  Pt will perform toileting with mod A.    Time  12    Period  Weeks    Status  New            Plan -  11/17/19 1327    Clinical Impression Statement  Pt is progressing with ADL participation/independence.  Pt also continues to make slow progress with RUE activitation with closed-chain activities, but continues to be inconsistent at times.  Pt also progressing with functional transfers.     Occupational performance deficits (Please refer to evaluation for details):  ADL's;IADL's;Leisure;Work;Social Participation    Body Structure / Function / Physical Skills  ADL;ROM;IADL;Sensation;Mobility;Strength;Tone;UE functional use;Decreased knowledge of precautions;Decreased knowledge of use of DME;Coordination;Balance;FMC;GMC    Cognitive Skills  Attention;Temperament/Personality;Understand;Thought;Safety Awareness    OT Frequency  2x / week    OT Duration  12 weeks    OT Treatment/Interventions  Self-care/ADL training;Moist Heat;DME and/or AE instruction;Splinting;Therapeutic activities;Aquatic Therapy;Cognitive remediation/compensation;Therapeutic exercise;Cryotherapy;Neuromuscular education;Functional Mobility Training;Passive range of motion;Visual/perceptual remediation/compensation;Patient/family education;Manual Therapy;Electrical Stimulation;Fluidtherapy    Plan  add light wt. bearing to HEP?, continue with closed-chain activities, neuro-re ed.    Consulted and Agree with Plan of Care  Patient;Family member/caregiver    Family Member Consulted  husband       Patient will benefit from skilled therapeutic intervention in order to improve the following deficits and impairments:   Body Structure / Function / Physical Skills: ADL, ROM, IADL, Sensation, Mobility, Strength, Tone, UE functional use, Decreased knowledge of precautions, Decreased knowledge of use of DME, Coordination, Balance, FMC, GMC Cognitive Skills: Attention, Temperament/Personality, Understand, Thought, Safety Awareness     Visit Diagnosis: Hemiplegia and hemiparesis following nontraumatic intracerebral hemorrhage affecting right dominant side (HCC)  Other abnormalities of gait and mobility  Muscle weakness (generalized)  Other disturbances of skin sensation  Unspecified symptoms and signs involving cognitive functions following other nontraumatic intracranial hemorrhage    Problem List Patient Active  Problem List   Diagnosis Date Noted  . Nontraumatic subcortical hemorrhage of left cerebral hemisphere ([place]) 08/27/2019  . Acute blood loss anemia   . Hypoalbuminemia due to protein-calorie malnutrition ([place])   . Thrombocytopenia ([place])   . Dysphagia 07/29/2019  . Infarction of left basal ganglia ([place]) 07/18/2019  . Hypernatremia   . Leukocytosis   . Essential hypertension   . Global aphasia   . Cytotoxic brain edema ([place]) 07/10/2019  . ICH (intracerebral hemorrhage) ([place]) 07/09/2019  . Anxiety state 02/21/2015  . Cephalalgia 12/21/2014    [facility] 11/17/2019, 1:55 PM  [facility] [address] [place], [place], [address] Phone: [phone]   Fax:  [phone]  Name: Melanie Madden MRN: [id number] Date of Birth: 1966/05/09   Vianne Bulls, OTR/L [facility] [address] [place], [place]  [address] [phone] phone [phone] 11/17/19 1:55 PM

## 2019-11-20 ENCOUNTER — Encounter: Payer: Self-pay | Admitting: Occupational Therapy

## 2019-11-20 ENCOUNTER — Encounter: Payer: Self-pay | Admitting: Physical Therapy

## 2019-11-20 ENCOUNTER — Ambulatory Visit: Payer: Managed Care, Other (non HMO) | Admitting: Occupational Therapy

## 2019-11-20 ENCOUNTER — Ambulatory Visit: Payer: Managed Care, Other (non HMO)

## 2019-11-20 ENCOUNTER — Ambulatory Visit: Payer: Managed Care, Other (non HMO) | Admitting: Physical Therapy

## 2019-11-20 ENCOUNTER — Other Ambulatory Visit: Payer: Self-pay

## 2019-11-20 DIAGNOSIS — I69151 Hemiplegia and hemiparesis following nontraumatic intracerebral hemorrhage affecting right dominant side: Secondary | ICD-10-CM

## 2019-11-20 DIAGNOSIS — M6281 Muscle weakness (generalized): Secondary | ICD-10-CM

## 2019-11-20 DIAGNOSIS — R4701 Aphasia: Secondary | ICD-10-CM

## 2019-11-20 DIAGNOSIS — R41841 Cognitive communication deficit: Secondary | ICD-10-CM

## 2019-11-20 DIAGNOSIS — R482 Apraxia: Secondary | ICD-10-CM | POA: Diagnosis not present

## 2019-11-20 DIAGNOSIS — R2689 Other abnormalities of gait and mobility: Secondary | ICD-10-CM

## 2019-11-20 DIAGNOSIS — I69219 Unspecified symptoms and signs involving cognitive functions following other nontraumatic intracranial hemorrhage: Secondary | ICD-10-CM

## 2019-11-20 DIAGNOSIS — R208 Other disturbances of skin sensation: Secondary | ICD-10-CM

## 2019-11-20 NOTE — Therapy (Signed)
[facility] [address] [place], [place], [address] Phone: [phone]   Fax:  [phone]  Occupational Therapy Treatment  Patient Details  Name: Melanie Madden MRN: [id number] Date of Birth: [birth year] Referring Provider (OT): Dr. Alysia Penna   Encounter Date: 11/20/2019  OT End of Session - 11/20/19 1045    Visit Number  22    Number of Visits  25    Date for OT Re-Evaluation  12/01/19    Authorization Type  [employer] 2021:  No prior auth OT, 60 DAY visit limit (regardless of how many therapies each day).    Authorization - Visit Number  25    Authorization - Number of Visits  60    OT Start Time  1020    OT Stop Time  1100    OT Time Calculation (min)  40 min    Activity Tolerance  Patient tolerated treatment well    Behavior During Therapy  Restless;Impulsive       Past Medical History:  Diagnosis Date  . Allergy   . Anxiety   . Arthritis   . High triglycerides   . Kidney stones   . Migraines   . Recurrent sinus infections     Past Surgical History:  Procedure Laterality Date  . ANTERIOR CRUCIATE LIGAMENT REPAIR Left   . BUNIONECTOMY Right   . LASIK    . LITHOTRIPSY    . MENISCUS REPAIR Left   . NASAL SEPTUM SURGERY    . SHOULDER SURGERY    . VARICOSE VEIN SURGERY Left     There were no vitals filed for this visit.  Subjective Assessment - 11/20/19 1045    Patient is accompanied by:  --   aide, husband   Pertinent History  Subcortical hemorrhage of R cerbral hemisphere.  PMH:  HTN, anxiety, migraines, hx of shoulder injection approx 2 weeks ago for R shoulder pain, hx of R shoulder surgery (arthroscopic)    Limitations  aphasia, fall risk, impulsive (husband reports hx of OCD)    Patient Stated Goals  husband reports: to be able to care for herself more (pt indicates that she onces to return to previous activities per questioning/gestures)    Currently in Pain?  No/denies       Pt  perseverated on inability to locate jacket after last session throughout session, resulting in decr attention, impulsivity, and frustration affecting performance.    transfer w/c>mat with CGA.    In sitting,  light wt bearing with body on arm movements (LUE reach to targets) to incr activation RUE/trunk with min facilitation and cueing normal movement patterns.    Light wt. Bearing/AAROM shoulder flex/elbow ext with tilted stool with min facilitation initially, then without facilitation/min cueing only  AAROM shoulder horizontal abduction/adduction towel slide with mod facilitation/cueing.  Therapist assisted low-range supported reach AAROM to grasp/release bottle with mod facilitation sliding along horizontal surface with mod facilitation/cues.     In sitting, bilateral low-range functional reach for RUE stabilization/AAROM from in small range with min facilitation and cueing for normal movement patterns.         OT Short Term Goals - 11/17/19 1321      OT SHORT TERM GOAL #1   Title  Pt/husband will be independent with initial HEP for RUE ROM--check STGs 10/14/19    Time  6    Period  Weeks    Status  Achieved      OT SHORT TERM GOAL #2  Title  Pt will perform UB dressing with min A.    Time  6    Period  Weeks    Status  Achieved   MOD ASSIST.  11/11/19:  pt able to don/doff zip-up fleece mod I.  11/17/19:  met per husband report     OT SHORT TERM GOAL #3   Title  Pt will perform UB bathing with mod A.    Time  6    Period  Weeks    Status  Achieved      OT SHORT TERM GOAL #4   Title  Pt will be able to brush hair with set-up.    Time  6    Period  Weeks    Status  On-going   fluctuating assist needed     OT SHORT TERM GOAL #5   Title  Pt will demo at least 110* shoulder flex PROM in supine without pain for incr ease for ADLs.    Time  6    Period  Weeks    Status  Achieved        OT Long Term Goals - 11/17/19 1322      OT LONG TERM GOAL #1   Title   Pt/husband will be independent with HEP for functional activities for incr participation in IADLs/leisure tasks and for cognition (and incorporating RUE if able).--check LTGs 12/01/19    Time  12    Period  Weeks    Status  New      OT LONG TERM GOAL #2   Title  Pt will perform UB dressing with supervision.    Time  12    Period  Weeks    Status  On-going      OT LONG TERM GOAL #3   Title  Pt will perform LB dressing with mod A.    Time  12    Period  Weeks    Status  Achieved   11/17/19:  min A per husband report     OT LONG TERM GOAL #4   Title  Pt will perform UB bathing with min A.    Time  12    Period  Weeks    Status  Achieved   11/17/19:  met per husband report     OT LONG TERM GOAL #5   Title  Pt will perform simple cold snack prep from w/c level with min A/set up.    Time  12    Period  Weeks    Status  New      OT LONG TERM GOAL #6   Title  Pt will perform toileting with mod A.    Time  12    Period  Weeks    Status  New            Plan - 11/20/19 1049    Clinical Impression Statement  Pt with improving consistency with RUE activation with closed-chain activities, but performance significantly affected today by decr attention, perseveration, impulsivity as pt was upset.    Occupational performance deficits (Please refer to evaluation for details):  ADL's;IADL's;Leisure;Work;Social Participation    Body Structure / Function / Physical Skills  ADL;ROM;IADL;Sensation;Mobility;Strength;Tone;UE functional use;Decreased knowledge of precautions;Decreased knowledge of use of DME;Coordination;Balance;FMC;GMC    Cognitive Skills  Attention;Temperament/Personality;Understand;Thought;Safety Awareness    OT Frequency  2x / week    OT Duration  12 weeks    OT Treatment/Interventions  Self-care/ADL training;Moist Heat;DME and/or AE instruction;Splinting;Therapeutic activities;Aquatic Therapy;Cognitive remediation/compensation;Therapeutic exercise;Cryotherapy;Neuromuscular  education;Functional Mobility Training;Passive range of motion;Visual/perceptual remediation/compensation;Patient/family education;Manual Therapy;Electrical Stimulation;Fluidtherapy    Plan  continue with closed-chain activities, neuro-re ed.    Consulted and Agree with Plan of Care  Patient;Family member/caregiver    Family Member Consulted  husband       Patient will benefit from skilled therapeutic intervention in order to improve the following deficits and impairments:   Body Structure / Function / Physical Skills: ADL, ROM, IADL, Sensation, Mobility, Strength, Tone, UE functional use, Decreased knowledge of precautions, Decreased knowledge of use of DME, Coordination, Balance, FMC, GMC Cognitive Skills: Attention, Temperament/Personality, Understand, Thought, Safety Awareness     Visit Diagnosis: Hemiplegia and hemiparesis following nontraumatic intracerebral hemorrhage affecting right dominant side (HCC)  Other disturbances of skin sensation  Unspecified symptoms and signs involving cognitive functions following other nontraumatic intracranial hemorrhage  Other abnormalities of gait and mobility  Muscle weakness (generalized)    Problem List Patient Active Problem List   Diagnosis Date Noted  . Nontraumatic subcortical hemorrhage of left cerebral hemisphere ([place]) 08/27/2019  . Acute blood loss anemia   . Hypoalbuminemia due to protein-calorie malnutrition ([place])   . Thrombocytopenia ([place])   . Dysphagia 07/29/2019  . Infarction of left basal ganglia ([place]) 07/18/2019  . Hypernatremia   . Leukocytosis   . Essential hypertension   . Global aphasia   . Cytotoxic brain edema ([place]) 07/10/2019  . ICH (intracerebral hemorrhage) ([place]) 07/09/2019  . Anxiety state 02/21/2015  . Cephalalgia 12/21/2014    [facility] 11/20/2019, 12:26 PM  [facility] [address] [place], [place], [address] Phone: [phone]    Fax:  [phone]  Name: Melanie Madden MRN: [id number] Date of Birth: 29-Oct-1965   Vianne Bulls, OTR/L [facility] [address] [place], [place]  [address] [phone] phone [phone] 11/20/19 12:26 PM

## 2019-11-20 NOTE — Therapy (Signed)
[facility] [address] [place], [place], [address] Phone: [phone]   Fax:  [phone]  Speech Language Pathology Treatment  Patient Details  Name: Melanie Madden MRN: [id number] Date of Birth: 1966/02/21 Referring Provider (SLP): Alysia Penna, MD   Encounter Date: 11/20/2019  End of Session - 11/20/19 2156    Visit Number  23    Number of Visits  25    Date for SLP Re-Evaluation  12/04/19    Authorization Type  cigna    Authorization Time Period  6 DAYS of therapy regardless of number disciplines per day    Authorization - Visit Number  66    Authorization - Number of Visits  106    SLP Start Time  0935    SLP Stop Time   1017    SLP Time Calculation (min)  42 min    Activity Tolerance  Patient tolerated treatment well       Past Medical History:  Diagnosis Date  . Allergy   . Anxiety   . Arthritis   . High triglycerides   . Kidney stones   . Migraines   . Recurrent sinus infections     Past Surgical History:  Procedure Laterality Date  . ANTERIOR CRUCIATE LIGAMENT REPAIR Left   . BUNIONECTOMY Right   . LASIK    . LITHOTRIPSY    . MENISCUS REPAIR Left   . NASAL SEPTUM SURGERY    . SHOULDER SURGERY    . VARICOSE VEIN SURGERY Left     There were no vitals filed for this visit.  Subjective Assessment - 11/20/19 2145    Subjective  Brought Lingraphica. "She practiced for, like, 3 hours yesterday."    Currently in Pain?  No/denies            ADULT SLP TREATMENT - 11/20/19 2147      General Information   Behavior/Cognition  Alert;Cooperative      Treatment Provided   Treatment provided  Cognitive-Linquistic      Cognitive-Linquistic Treatment   Treatment focused on  Aphasia;Apraxia    Skilled Treatment  Pt showed SLP what she practiced yesterday - all phonemes and phoneme combinations/blends. SLP assessed pt performance with random phonemes - pt with /d/ substitution for most all  voiced consonants except /h/. /a/, /u/, /i/, "uh" "oh" produced correctly. SLP asked pt for her birthday and SLP navigted to the correct page- pt selected every icon on the first row and looked at SLP to confirm she was correct after selecting each one. SLP provided total A for pt to access her birthday (icon=birthday cake). Pt req'd total A for adding "[employer]" - at one time she accessed each icon one after the other in ~3 rows in "lunch". Aide stated pt uses Lingraphica "all the time" to tell her what she wants to eat and accesses, "I can't talk but I understand what you're saying." Pt demonstrated word comprehension today from f:4. SLP reiterated to pt that she will find the device useful for       Assessment / Recommendations / [place] with current plan of care   ? appropriate use of Lingraphica given decr'd cognition     Progression Toward Goals   Progression toward goals  Progressing toward goals       SLP Education - 11/20/19 2218    Education Details  device is not primraily for practice - for communication and pt will need it  to communicate (e.g., breakfast food), basic programming       SLP Short Term Goals - 10/07/19 1239      SLP SHORT TERM GOAL #1   Title  pt will simultaneously with SLP produce 3 different phonemes 50% of the time over three sessions    Status  Partially Met      SLP SHORT TERM GOAL #2   Title  pt will ID objects salient to pt in f:4 80% of the time over 3 sessions    Status  Deferred      SLP [place] #3   Title  pt will write her full name on first attempt over three sessions    Time  1    Status  Deferred      SLP SHORT TERM GOAL #4   Title  pt will attempt expressive communication via multimodal means during 5 sessions    Status  Not Met      SLP SHORT TERM GOAL #5   Title  pt will demonstrate undertanding of simple 1-step functional commands with 70% accuracy with multimodal cues over 3 sessions    Status  Not Met       SLP SHORT TERM GOAL #6   Title  pt will look at object or line drawing and match the correct word from f:6 words 90% success over 3 sessions    Status  Deferred       SLP Long Term Goals - 11/20/19 2208      SLP LONG TERM GOAL #1   Title  pt's diet will be appropriately upgraded as needed by SLP    Time  1    Period  Weeks   or 25 total visits, for all LTGs   Status  On-going      SLP LONG TERM GOAL #2   Title  pt will produce 3 simple words/words pertinent to pt (family or pet names, etc) simultaneously with SLP and multimodal cues over three sessions    Time  1    Period  Weeks    Status  On-going      SLP LONG TERM GOAL #3   Title  pt will demo understanding of simple yes/no questions pertinent to desires/wants with multimodal cues 90% of the time over 3 sessions    Time  1    Period  Weeks    Status  On-going      SLP LONG TERM GOAL #4   Title  a device trial with a speech generating device will be initiated within the first 20 ST visits    Status  Achieved      SLP LONG TERM GOAL #5   Title  pt will demo recognition of written word for a common object 100% success over 3 sessions    Baseline  11-13-19, 11-20-19    Time  1    Period  Weeks    Status  On-going      SLP LONG TERM GOAL #6   Title  pt will work on speech-related tasks at home between 75% of sessions as reported by family and/or noted on [place] website    Baseline  10-21-19, 10-26-19, 10-28-19, 11-11-19, 11-13-19, 11-20-19    Time  2    Period  Weeks    Status  On-going       Plan - 11/20/19 2207    Clinical Impression Statement  Cont with no complaints with dys III diet/thin, to SLP  knowledge. Pt WILL NEED TO COMPLY with daily work at home for ST to be most effective. Pt cont to demo cognitive communication deficits in at least the areas of attention (incr'd impulsivity) and awareness (appears mostly unaware of verbal deficits vs/in addition to receptive languge deficit). Pt cont to present  with a severe/profound expressive aphasia with verbal apraxia, and severe/profound receptive aphasia ("global aphasia"). A device trial with Lingraphica continues - pt's cognition post-CVA and premorid psych hx complicates the trial..See "skilled treatment" for more details of today's session. She would cont to benefit from skilled ST focusing on incr'ing communicative effectiveness via multimodal means and incr'ing functional communication (verbal or nonverbal), incr'ing cognition in functional ways as able given pt's language deficit, and monitoring/upgrading pt's diet as appropriate.Pt knows she will need to participate at home for therapy here to be of any assistance.    Speech Therapy Frequency  2x / week    Duration  --   8 weeks   Treatment/Interventions  Diet toleration management by SLP;Trials of upgraded texture/liquids;Language facilitation;Internal/external aids;Cognitive reorganization;Cueing hierarchy;SLP instruction and feedback;Patient/family education;Compensatory strategies;Multimodal communcation approach;Functional tasks;Environmental controls    Potential to Achieve Goals  Fair    Potential Considerations  Severity of impairments;Cooperation/participation level       Patient will benefit from skilled therapeutic intervention in order to improve the following deficits and impairments:   Aphasia  Verbal apraxia  Cognitive communication deficit    Problem List Patient Active Problem List   Diagnosis Date Noted  . Nontraumatic subcortical hemorrhage of left cerebral hemisphere ([place]) 08/27/2019  . Acute blood loss anemia   . Hypoalbuminemia due to protein-calorie malnutrition ([place])   . Thrombocytopenia ([place])   . Dysphagia 07/29/2019  . Infarction of left basal ganglia ([place]) 07/18/2019  . Hypernatremia   . Leukocytosis   . Essential hypertension   . Global aphasia   . Cytotoxic brain edema ([place]) 07/10/2019  . ICH (intracerebral hemorrhage) ([place]) 07/09/2019  . Anxiety  state 02/21/2015  . Cephalalgia 12/21/2014    [facility], [place]  11/20/2019, 10:19 PM  [facility] [address] [place], [place], [address] Phone: [phone]   Fax:  [phone]   Name: Karigan Cloninger MRN: [id number] Date of Birth: Jun 26, 1966

## 2019-11-20 NOTE — Therapy (Signed)
[facility] [address] [place], [place], [address] Phone: [phone]   Fax:  [phone]  Physical Therapy Treatment  Patient Details  Name: Melanie Madden MRN: [id number] Date of Birth: 1966/01/28 Referring Provider (PT): Lauraine Rinne, [place]   Encounter Date: 11/20/2019  PT End of Session - 11/20/19 1605    Visit Number  22    Number of Visits  35    Date for PT Re-Evaluation  12/30/19    Authorization Type  [employer]- 60 VL combined PT/OT/ST - counts as 1 visit if seen on same day.  Not sure if 60 is hard max.  $50 copay each day - make sure PT/OT/ST on same day if possible    Authorization - Visit Number  --   PT/OT/ST days combined   Authorization - Number of Visits  60    PT Start Time  1103    PT Stop Time  1143    PT Time Calculation (min)  40 min    Equipment Utilized During Treatment  Gait belt   pt's R AFO; dance shoe cover to aid in foot clearance   Activity Tolerance  Patient tolerated treatment well    Behavior During Therapy  Impulsive;Agitated       Past Medical History:  Diagnosis Date  . Allergy   . Anxiety   . Arthritis   . High triglycerides   . Kidney stones   . Migraines   . Recurrent sinus infections     Past Surgical History:  Procedure Laterality Date  . ANTERIOR CRUCIATE LIGAMENT REPAIR Left   . BUNIONECTOMY Right   . LASIK    . LITHOTRIPSY    . MENISCUS REPAIR Left   . NASAL SEPTUM SURGERY    . SHOULDER SURGERY    . VARICOSE VEIN SURGERY Left     There were no vitals filed for this visit.  Subjective Assessment - 11/20/19 1550    Subjective  Caregiver reports pt worried that she left sweater at clinic last visit, but that it is in fact at home.    Patient is accompained by:  --   Caregivers   Pertinent History  anxiety, migraines, HTN    Patient Stated Goals  Per husband report they would like her to get stronger on right, improve mobility, and work on speech.    Currently in Pain?  No/denies                       [facility] Adult PT Treatment/Exercise - 11/20/19 0001      Transfers   Transfers  Sit to Stand;Stand to Sit    Sit to Stand  4: Min guard;4: Min assist    Sit to Stand Details  Verbal cues for technique    Stand to Sit  4: Min guard;4: Min assist;Uncontrolled descent   Leaves hands on walker to sit, throughout session   Stand to Sit Details (indicate cue type and reason)  Verbal cues for precautions/safety    Stand to Sit Details  PT assisted to remove R hand from walker; cues to back up far enough and reach back with LUE for safe sitting.      Ambulation/Gait   Ambulation/Gait  Yes    Ambulation/Gait Assistance  3: Mod assist;4: Min assist    Ambulation/Gait Assistance Details  Pt ambulates with RW with R hand grip attachment, with min/mod assist for R foot placement and tactile cues for more upright trunk.  Pt able to guide walker, with occasional therapist assist at curves and obstacles.  She needs assistance for improved R hip flexion and R foot placement, to avoid RLE external rotation and R foot drag, where she will hit R posterior leg of RW with R foot.  Utilized dance-skin on front of R shoe for ease of R foot clearance.  With bout of walking after standing exercise for RLE kicking and after seated SciFit activity, pt appears to have improved R foot clearance and foot placement.    Ambulation Distance (Feet)  125 Feet   75 ft, 50 ft   Assistive device  Rolling walker   R hand grip attachment, R AFO   Gait Pattern  Step-to pattern;Step-through pattern;Decreased stance time - right;Decreased hip/knee flexion - right;Right circumduction;Decreased step length - left;Right genu recurvatum;Poor foot clearance - right    Ambulation Surface  Level;Indoor      Neuro Re-ed    Neuro Re-ed Details   Standing activities at [facility] with R hand placed in hand grip attachment, for improved RLE weightshift and WB:  LLE step forward/back to  midline, x 5 reps, 2 sets, with cues for slowed pace. TActile cues given at R gluts, quads to prevent R knee buckling.  Attempted partial tandem stance with RLE in posterior position, x 4 reps 5 seconds, with tactile cues for more upright posture (each successive rep, pt sinks more into R hip/knee flexion).  Cues provided at trunk for upright posture through trunk, but pt very quick to perform activities today, prior to verbal and visual instructions completed.  In standing, LLE on 2" block, then kicking ball forward RLE, 3 sets x 5 reps (with another therapist assisting with ball), to promote R hip flexion, initiation of swing phase.  Utilized SciFit, x 3 minutes, level 1.6 BLEs and LUE with tactile cues/assistance given to RLE to avoid external rotation, to assist with placement.  When pt cued to push on her own, she is able to do it briefly without LUE support, needing cues for full R knee extension motion.  Finally, at mat table, wtih therapist assisting to tuck RLE posteriorly and provide tactile/manual cues to R quads/gluts, worked on sit>stand with more forward lean and brief upright standing posture.  Cues and assistance to slowly sit.               PT Short Term Goals - 10/28/19 2028      PT SHORT TERM GOAL #1   Title  Pt will be able to perform all transfers CGA for improved mobility.    Time  4    Period  Weeks    Status  New    Target Date  12/02/19      PT SHORT TERM GOAL #2   Title  Pt will be able to maintain standing with 1 UE support x 5 min to assist with standing ADLs supervision.    Time  4    Period  Weeks    Status  New    Target Date  12/02/19      PT SHORT TERM GOAL #3   Title  Pt will ambulate >350' with min assist of 1 person with RW with right hand grip attachment so that husband can safely ambulate with her at home.    Time  4    Period  Weeks    Status  New    Target Date  12/02/19        PT Long Term  Goals - 10/28/19 2031      PT LONG TERM GOAL #1    Title  Pt will be able to perform progressive HEP for strengthening, balance and mobility with assist of husband to continued gains at home.    Time  8    Period  Weeks    Status  On-going    Target Date  12/30/19      PT LONG TERM GOAL #2   Title  Pt will decrease 5 x sit to stand from 27 sec to <22 sec from edge of mat for improved balance and functional mobility.    Time  8    Period  Weeks    Status  New    Target Date  12/30/19      PT LONG TERM GOAL #3   Title  Pt will ambulate >400' CGA with RW with right hand grip attachment for improved gait ability in home.    Time  8    Period  Weeks    Status  New    Target Date  12/30/19            Plan - 11/20/19 1607    Clinical Impression Statement  Pt needs more redirection during therapy session and seems more impulsive with transfers and activities than last several sessions today, with caregiver and OT (from previous session) reporting pt is overly concerned about a sweater that she thinks got left at clinic.  After standing exercise to increase R hip flexion and use of SciFit for R hip/knee flexion, pt improves with R foot clearance and foot placement with gait.  Will continue to benefit from further skilled PT to address strength, NMR to RLE and gait training for imrpoved overall functional mobility.    Personal Factors and Comorbidities  Comorbidity 3+    Comorbidities  anxiety, migraines, HTN    Examination-Activity Limitations  Bathing;Bed Mobility;Stand;Locomotion Level;Squat;Stairs;Dressing;Transfers    Examination-Participation Restrictions  Community Activity;Driving;Shop;Laundry;Meal Prep    Stability/Clinical Decision Making  Evolving/Moderate complexity    Rehab Potential  Fair    PT Frequency  2x / week    PT Duration  8 weeks    PT Treatment/Interventions  ADLs/Self Care Home Management;Electrical Stimulation;DME Instruction;Gait training;Stair training;Functional mobility training;Therapeutic  activities;Therapeutic exercise;Orthotic Fit/Training;Patient/family education;Neuromuscular re-education;Balance training;Manual techniques;Passive range of motion;Vestibular;Wheelchair mobility training    PT Next Visit Plan  Danceskin to front of right shoe to help slide. Continue with activities in standing to facilitate increased right weight shift and R hip flexion/initiation of R swing phase, continue gait training with RW with right hand grip attachment.  Pt responded well to short bout on SciFit and may benefit for use to help wtih RLE strength, NMR.    Consulted and Agree with Plan of Care  Patient    Family Member Consulted  Caregiver       Patient will benefit from skilled therapeutic intervention in order to improve the following deficits and impairments:  Abnormal gait, Decreased activity tolerance, Decreased balance, Decreased cognition, Decreased knowledge of use of DME, Decreased mobility, Decreased range of motion, Decreased safety awareness, Decreased strength, Impaired sensation  Visit Diagnosis: Other abnormalities of gait and mobility  Muscle weakness (generalized)     Problem List Patient Active Problem List   Diagnosis Date Noted  . Nontraumatic subcortical hemorrhage of left cerebral hemisphere ([place]) 08/27/2019  . Acute blood loss anemia   . Hypoalbuminemia due to protein-calorie malnutrition ([place])   . Thrombocytopenia ([place])   . Dysphagia 07/29/2019  .  Infarction of left basal ganglia ([place]) 07/18/2019  . Hypernatremia   . Leukocytosis   . Essential hypertension   . Global aphasia   . Cytotoxic brain edema ([place]) 07/10/2019  . ICH (intracerebral hemorrhage) ([place]) 07/09/2019  . Anxiety state 02/21/2015  . Cephalalgia 12/21/2014    Emie Sommerfeld W. 11/20/2019, 4:11 PM  Frazier Butt., PT   [facility] [address] [place], [place], [address] Phone: [phone]   Fax:  [phone]  Name:  Melanie Madden MRN: [id number] Date of Birth: [birth year]

## 2019-11-24 ENCOUNTER — Ambulatory Visit: Payer: Managed Care, Other (non HMO)

## 2019-11-24 ENCOUNTER — Ambulatory Visit: Payer: Managed Care, Other (non HMO) | Admitting: Occupational Therapy

## 2019-11-24 ENCOUNTER — Other Ambulatory Visit: Payer: Self-pay

## 2019-11-24 DIAGNOSIS — R2689 Other abnormalities of gait and mobility: Secondary | ICD-10-CM

## 2019-11-24 DIAGNOSIS — I69219 Unspecified symptoms and signs involving cognitive functions following other nontraumatic intracranial hemorrhage: Secondary | ICD-10-CM

## 2019-11-24 DIAGNOSIS — R482 Apraxia: Secondary | ICD-10-CM | POA: Diagnosis not present

## 2019-11-24 DIAGNOSIS — I69151 Hemiplegia and hemiparesis following nontraumatic intracerebral hemorrhage affecting right dominant side: Secondary | ICD-10-CM

## 2019-11-24 DIAGNOSIS — R208 Other disturbances of skin sensation: Secondary | ICD-10-CM

## 2019-11-24 DIAGNOSIS — R41841 Cognitive communication deficit: Secondary | ICD-10-CM

## 2019-11-24 DIAGNOSIS — M6281 Muscle weakness (generalized): Secondary | ICD-10-CM

## 2019-11-24 DIAGNOSIS — R4701 Aphasia: Secondary | ICD-10-CM

## 2019-11-24 NOTE — Therapy (Signed)
[facility] [address] [place], [place], [address] Phone: [phone]   Fax:  [phone]  Physical Therapy Treatment  Patient Details  Name: Melanie Madden MRN: [id number] Date of Birth: [birth year] Referring Provider (PT): Lauraine Rinne, [place]   Encounter Date: 11/24/2019  PT End of Session - 11/24/19 1104    Visit Number  23    Number of Visits  35    Date for PT Re-Evaluation  12/30/19    Authorization Type  [employer]- 60 VL combined PT/OT/ST - counts as 1 visit if seen on same day.  Not sure if 60 is hard max.  $50 copay each day - make sure PT/OT/ST on same day if possible    Authorization - Visit Number  --   PT/OT/ST days combined   Authorization - Number of Visits  60    PT Start Time  1102    PT Stop Time  1145    PT Time Calculation (min)  43 min    Equipment Utilized During Treatment  Gait belt   pt's R AFO; dance shoe cover to aid in foot clearance   Activity Tolerance  Patient tolerated treatment well    Behavior During Therapy  Impulsive;Agitated       Past Medical History:  Diagnosis Date  . Allergy   . Anxiety   . Arthritis   . High triglycerides   . Kidney stones   . Migraines   . Recurrent sinus infections     Past Surgical History:  Procedure Laterality Date  . ANTERIOR CRUCIATE LIGAMENT REPAIR Left   . BUNIONECTOMY Right   . LASIK    . LITHOTRIPSY    . MENISCUS REPAIR Left   . NASAL SEPTUM SURGERY    . SHOULDER SURGERY    . VARICOSE VEIN SURGERY Left     There were no vitals filed for this visit.  Subjective Assessment - 11/24/19 1103    Subjective  Pt just finished with OT.    Patient is accompained by:  --   Caregivers   Pertinent History  anxiety, migraines, HTN    Patient Stated Goals  Per husband report they would like her to get stronger on right, improve mobility, and work on speech.    Currently in Pain?  No/denies                       [facility] Adult PT  Treatment/Exercise - 11/24/19 1104      Transfers   Transfers  Sit to Stand;Stand to Sit    Sit to Stand  4: Min guard;4: Min assist    Sit to Stand Details  Verbal cues for precautions/safety;Verbal cues for technique    Stand to Sit  4: Min guard;4: Min assist    Stand to Sit Details (indicate cue type and reason)  Verbal cues for precautions/safety    Stand to Sit Details  Pt needed assist to remove right hand from grip attachment.      Ambulation/Gait   Ambulation/Gait  Yes    Ambulation/Gait Assistance  3: Mod assist    Ambulation/Gait Assistance Details  Pt needed assist at times at right foot to clear walker as foot ER and hits back leg of walker. Pt was given verbal cues to try to stand more erect. Verbal cues to try to take larger left step. Noted some improvement in right foot placement briefly after Sci-Fit but then went back to hitting walker.  Ambulation Distance (Feet)  115 Feet    Assistive device  Rolling walker   right hand grip attachment, right AFO   Gait Pattern  Step-to pattern;Step-through pattern;Decreased hip/knee flexion - right;Decreased step length - right;Decreased step length - left;Decreased stance time - right;Decreased weight shift to right    Ambulation Surface  Level;Indoor      Neuro Re-ed    Neuro Re-ed Details   Seated with right foot on foam roll trying to ext/flex knee x 10 with min assist. Supine right hip flex/ext with foot on red physioball x 10 with PT stabilizing at hip and helping min assist to flex up. Cued to push down with extension. Hooklying trying to maintiain right hip in neutral with tactile cuing on medial thigh to help initiate. Pt able to hold for brief bouts then had her try to control abd through minimal range but poor control here. Was able to bring back up to neutral for a few reps with tactile cuing. Then attempted to have her further adduct/IR x 5. Attempted to maintain hooklying with both legs bent but had more difficulty isolating  RLE. Standing at walker having patient try to kick ball with RLE x 5 but needed min assist to prevent abd/ER. Stepping forward and back with LLE with PT stabilizing at right knee x 8 then holding staggered stance with LLE in front x 5 sec with visual cues in mirror as well for upright posture and PT stabilizing at right knee.       Knee/Hip Exercises: Aerobic   Other Aerobic  Sci Fit x 5 min level 1.0 with legs only with PT stabilizing at right knee to keep hip in neutral. Verbal cues to push through full motion.               PT Short Term Goals - 10/28/19 2028      PT SHORT TERM GOAL #1   Title  Pt will be able to perform all transfers CGA for improved mobility.    Time  4    Period  Weeks    Status  New    Target Date  12/02/19      PT SHORT TERM GOAL #2   Title  Pt will be able to maintain standing with 1 UE support x 5 min to assist with standing ADLs supervision.    Time  4    Period  Weeks    Status  New    Target Date  12/02/19      PT SHORT TERM GOAL #3   Title  Pt will ambulate >350' with min assist of 1 person with RW with right hand grip attachment so that husband can safely ambulate with her at home.    Time  4    Period  Weeks    Status  New    Target Date  12/02/19        PT Long Term Goals - 10/28/19 2031      PT LONG TERM GOAL #1   Title  Pt will be able to perform progressive HEP for strengthening, balance and mobility with assist of husband to continued gains at home.    Time  8    Period  Weeks    Status  On-going    Target Date  12/30/19      PT LONG TERM GOAL #2   Title  Pt will decrease 5 x sit to stand from 27 sec to <22 sec from edge of  mat for improved balance and functional mobility.    Time  8    Period  Weeks    Status  New    Target Date  12/30/19      PT LONG TERM GOAL #3   Title  Pt will ambulate >400' CGA with RW with right hand grip attachment for improved gait ability in home.    Time  8    Period  Weeks    Status  New     Target Date  12/30/19            Plan - 11/24/19 1938    Clinical Impression Statement  Pt was less impulsive today following cues better to slow down. Pt showed brief improvement with RLE placement/clearance with gait after Sci-fit but continues to have trouble with not bumping back leg of walker and leaning more to left.    Personal Factors and Comorbidities  Comorbidity 3+    Comorbidities  anxiety, migraines, HTN    Examination-Activity Limitations  Bathing;Bed Mobility;Stand;Locomotion Level;Squat;Stairs;Dressing;Transfers    Examination-Participation Restrictions  Community Activity;Driving;Shop;Laundry;Meal Prep    Stability/Clinical Decision Making  Evolving/Moderate complexity    Rehab Potential  Fair    PT Frequency  2x / week    PT Duration  8 weeks    PT Treatment/Interventions  ADLs/Self Care Home Management;Electrical Stimulation;DME Instruction;Gait training;Stair training;Functional mobility training;Therapeutic activities;Therapeutic exercise;Orthotic Fit/Training;Patient/family education;Neuromuscular re-education;Balance training;Manual techniques;Passive range of motion;Vestibular;Wheelchair mobility training    PT Next Visit Plan  Did schedule get fixed? Needs all 3 disciplines on same day. Danceskin to front of right shoe to help slide. Continue with activities in standing to facilitate increased right weight shift and R hip flexion/initiation of R swing phase, continue gait training with RW with right hand grip attachment.  Pt responded well to short bout on SciFit and may benefit for use to help wtih RLE strength, NMR.    Consulted and Agree with Plan of Care  Patient    Family Member Consulted  Caregiver       Patient will benefit from skilled therapeutic intervention in order to improve the following deficits and impairments:  Abnormal gait, Decreased activity tolerance, Decreased balance, Decreased cognition, Decreased knowledge of use of DME, Decreased  mobility, Decreased range of motion, Decreased safety awareness, Decreased strength, Impaired sensation  Visit Diagnosis: Other abnormalities of gait and mobility  Muscle weakness (generalized)     Problem List Patient Active Problem List   Diagnosis Date Noted  . Nontraumatic subcortical hemorrhage of left cerebral hemisphere ([place]) 08/27/2019  . Acute blood loss anemia   . Hypoalbuminemia due to protein-calorie malnutrition ([place])   . Thrombocytopenia ([place])   . Dysphagia 07/29/2019  . Infarction of left basal ganglia ([place]) 07/18/2019  . Hypernatremia   . Leukocytosis   . Essential hypertension   . Global aphasia   . Cytotoxic brain edema ([place]) 07/10/2019  . ICH (intracerebral hemorrhage) ([place]) 07/09/2019  . Anxiety state 02/21/2015  . Cephalalgia 12/21/2014    Electa Sniff, PT, DPT, NCS 11/24/2019, 7:40 PM  [facility] [address] [place], [place], [address] Phone: [phone]   Fax:  [phone]  Name: Melanie Madden MRN: [id number] Date of Birth: August 04, 1965

## 2019-11-24 NOTE — Therapy (Signed)
[facility] [address] [place], [place], [address] Phone: [phone]   Fax:  [phone]  Speech Language Pathology Treatment  Patient Details  Name: Melanie Madden MRN: [id number] Date of Birth: 1965-11-07 Referring Provider (SLP): Alysia Penna, MD   Encounter Date: 11/24/2019  End of Session - 11/24/19 1751    Visit Number  24    Number of Visits  25    Date for SLP Re-Evaluation  12/04/19    Authorization - Number of Visits  60   days   SLP Start Time  0935    SLP Stop Time   0388    SLP Time Calculation (min)  40 min    Activity Tolerance  Patient tolerated treatment well       Past Medical History:  Diagnosis Date  . Allergy   . Anxiety   . Arthritis   . High triglycerides   . Kidney stones   . Migraines   . Recurrent sinus infections     Past Surgical History:  Procedure Laterality Date  . ANTERIOR CRUCIATE LIGAMENT REPAIR Left   . BUNIONECTOMY Right   . LASIK    . LITHOTRIPSY    . MENISCUS REPAIR Left   . NASAL SEPTUM SURGERY    . SHOULDER SURGERY    . VARICOSE VEIN SURGERY Left     There were no vitals filed for this visit.  Subjective Assessment - 11/24/19 1737    Subjective  Pt arrived holding lt palm over her forehead. Kelvin Cellar) "The cab driver made a --hard left turn to get here. She was really upset- I think it scared her."    Currently in Pain?  Yes   pt indicated "mild pain" - "headache" to SLP via Lingraphica   Pain Score  --   faces scale   Pain Location  Head            ADULT SLP TREATMENT - 11/24/19 1740      General Information   Behavior/Cognition  Alert;Agitated      Treatment Provided   Treatment provided  Cognitive-Linquistic      Cognitive-Linquistic Treatment   Treatment focused on  Apraxia;Aphasia    Skilled Treatment  After pt tossed/put Lingrphica on table, she held forehead with her palm again. SLP asked if pt would "like to chill for 5 minutes  with the lights off" and pt nodded her head. SLP turned off one of the two lights. Pt gestured by pointing at the ceiling and said "duh!" SLP asked if she wanted both lights off and pt nodded "yes." SLP was silent for 20 seconds and pt turned Lingraphica on and went to therapy activities, picture memory, and proceeded to click on the picture impulsively x7, without reading or following directions. SLP guided pt to sentence memory and nonverbally showed pt how to complete the task after pt impulsively pushed all three picutre choices x3 stimuli. SLP then read pt the stimulus sentence and indicted nonverbally how pt should respond, and pt was 100% successful at choosing the correct icon to complete the sentence. At end of session, pt gestured at [facility] repeatedly, adn upon Tenae asking what pt wanted pt gave "toilet" sign immediately. Tenae took pt to restroom and then to OT.  Prior to restroom, SLP showed pt where to go to find therapy icons and then asked her which was first, next and last today (Ater SLP wrote pt schedule on paper). Pt req'd mod A initially faded to  min A to find therapy icon from homepage.       Assessment / Recommendations / Plan   Plan  --   suspect pt using Lingraphica for practice tasks, primarily     Progression Toward Goals   Progression toward goals  Not progressing toward goals (comment)   pt using Lingraphica primarily as practice tool?        SLP Short Term Goals - 10/07/19 1239      SLP SHORT TERM GOAL #1   Title  pt will simultaneously with SLP produce 3 different phonemes 50% of the time over three sessions    Status  Partially Met      SLP SHORT TERM GOAL #2   Title  pt will ID objects salient to pt in f:4 80% of the time over 3 sessions    Status  Deferred      SLP SHORT TERM GOAL #3   Title  pt will write her full name on first attempt over three sessions    Time  1    Status  Deferred      SLP SHORT TERM GOAL #4   Title  pt will attempt expressive  communication via multimodal means during 5 sessions    Status  Not Met      SLP SHORT TERM GOAL #5   Title  pt will demonstrate undertanding of simple 1-step functional commands with 70% accuracy with multimodal cues over 3 sessions    Status  Not Met      SLP SHORT TERM GOAL #6   Title  pt will look at object or line drawing and match the correct word from f:6 words 90% success over 3 sessions    Status  Deferred       SLP Long Term Goals - 11/24/19 1755      SLP LONG TERM GOAL #1   Title  pt's diet will be appropriately upgraded as needed by SLP    Time  1    Period  Weeks   or 25 total visits, for all LTGs   Status  On-going      SLP LONG TERM GOAL #2   Title  pt will produce 3 simple words/words pertinent to pt (family or pet names, etc) simultaneously with SLP and multimodal cues over three sessions    Time  1    Period  Weeks    Status  On-going      SLP LONG TERM GOAL #3   Title  pt will demo understanding of simple yes/no questions pertinent to desires/wants with multimodal cues 90% of the time over 3 sessions    Time  1    Period  Weeks    Status  On-going      SLP LONG TERM GOAL #4   Title  a device trial with a speech generating device will be initiated within the first 20 ST visits    Status  Achieved      SLP LONG TERM GOAL #5   Title  pt will demo recognition of written word for a common object 100% success over 3 sessions    Baseline  11-13-19, 11-20-19    Time  1    Period  Weeks    Status  On-going      SLP LONG TERM GOAL #6   Title  pt will work on speech-related tasks at home between 75% of sessions as reported by family and/or noted on [employer]  Baseline  10-21-19, 10-26-19, 10-28-19, 11-11-19, 11-13-19, 11-20-19    Time  2    Period  Weeks    Status  On-going       Plan - 11/24/19 1751    Clinical Impression Statement  Pt's progress hindered today by initial agitation upon entereing ST room. Pt was redirected after approx 5  minutes however cont to express consistent mild agitation/disappointment via facial expressions. Pt cont to demo cognitive communication deficits in at least the areas of attention (incr'd impulsivity) and awareness (appears mostly unaware of verbal deficits vs/in addition to receptive languge deficit). Pt also cont to present with a severe/profound expressive aphasia with verbal apraxia, and severe/profound receptive aphasia ("global aphasia"). A device trial with Lingraphica continues - pt's cognition post-CVA and premorid psych hx complicates the trial. SLP thinks pt may be using the device primarily as a practice tool rather than to communicate with caregiver and family. If this is the case, SLP cannot recommend the device at the present time. See "skilled treatment" for more details of today's session. Pt may cont to benefit from skilled ST focusing on incr'ing communicative effectiveness via multimodal means and incr'ing functional communication (verbal or nonverbal), incr'ing cognition in functional ways as able given pt's language deficit, and monitoring/upgrading pt's diet as appropriate.Pt knows she will need to participate at home for therapy here to be of any assistance.    Speech Therapy Frequency  2x / week    Duration  --   8 weeks   Treatment/Interventions  Diet toleration management by SLP;Trials of upgraded texture/liquids;Language facilitation;Internal/external aids;Cognitive reorganization;Cueing hierarchy;SLP instruction and feedback;Patient/family education;Compensatory strategies;Multimodal communcation approach;Functional tasks;Environmental controls    Potential to Achieve Goals  Fair    Potential Considerations  Severity of impairments;Cooperation/participation level       Patient will benefit from skilled therapeutic intervention in order to improve the following deficits and impairments:   Verbal apraxia  Aphasia  Cognitive communication deficit    Problem List Patient  Active Problem List   Diagnosis Date Noted  . Nontraumatic subcortical hemorrhage of left cerebral hemisphere ([place]) 08/27/2019  . Acute blood loss anemia   . Hypoalbuminemia due to protein-calorie malnutrition ([place])   . Thrombocytopenia ([place])   . Dysphagia 07/29/2019  . Infarction of left basal ganglia ([place]) 07/18/2019  . Hypernatremia   . Leukocytosis   . Essential hypertension   . Global aphasia   . Cytotoxic brain edema ([place]) 07/10/2019  . ICH (intracerebral hemorrhage) ([place]) 07/09/2019  . Anxiety state 02/21/2015  . Cephalalgia 12/21/2014    [facility] ,[place], [place]  11/24/2019, 5:55 PM  [facility] [address] [place], [place], [address] Phone: [phone]   Fax:  [phone]   Name: Melanie Madden MRN: [id number] Date of Birth: 03-Jan-1966

## 2019-11-24 NOTE — Therapy (Signed)
[facility] [address] [place], [place], [address] Phone: [phone]   Fax:  [phone]  Occupational Therapy Treatment  Patient Details  Name: Melanie Madden MRN: [id number] Date of Birth: 29-Aug-1965 Referring Provider (OT): Dr. Alysia Penna   Encounter Date: 11/24/2019  OT End of Session - 11/24/19 1451    Visit Number  23    Number of Visits  25    Date for OT Re-Evaluation  12/01/19    Authorization Type  [employer] 2021:  No prior auth OT, 60 DAY visit limit (regardless of how many therapies each day).    Authorization - Visit Number  26    Authorization - Number of Visits  60    OT Start Time  1019    OT Stop Time  1100    OT Time Calculation (min)  41 min    Activity Tolerance  Patient tolerated treatment well    Behavior During Therapy  Restless;Impulsive       Past Medical History:  Diagnosis Date  . Allergy   . Anxiety   . Arthritis   . High triglycerides   . Kidney stones   . Migraines   . Recurrent sinus infections     Past Surgical History:  Procedure Laterality Date  . ANTERIOR CRUCIATE LIGAMENT REPAIR Left   . BUNIONECTOMY Right   . LASIK    . LITHOTRIPSY    . MENISCUS REPAIR Left   . NASAL SEPTUM SURGERY    . SHOULDER SURGERY    . VARICOSE VEIN SURGERY Left     There were no vitals filed for this visit.  Subjective Assessment - 11/24/19 1446    Subjective   pt gestures that she is toileting with CGA/supervision to walk/stand only (aide confirms)    Patient is accompanied by:  --   aide   Pertinent History  Subcortical hemorrhage of R cerbral hemisphere.  PMH:  HTN, anxiety, migraines, hx of shoulder injection approx 2 weeks ago for R shoulder pain, hx of R shoulder surgery (arthroscopic)    Limitations  aphasia, fall risk, impulsive (husband reports hx of OCD)    Patient Stated Goals  husband reports: to be able to care for herself more (pt indicates that she onces to return to  previous activities per questioning/gestures)    Currently in Pain?  No/denies           transfer w/c>mat with close supervision.    In sitting, light wt bearing with body on arm movements (LUE reach to targets) to incr activation RUE/trunk with min facilitation and cueing normal movement patterns.    Light wt. Bearing/AAROM shoulder flex/elbow ext with tilted stool with min facilitation initially, then without facilitation/min cueing only.  Pt able to demo improved ability to control elbow flex today with min-mod cueing and incr range for elbow ext/shoulder flexion.  AAROM shoulder horizontal abduction/adduction towel slide with min-mod facilitation/cueing then in shoulder flex/elbow ext to target with min facilitation/cues.  Sit>prone with min facilitation/mod cueing, prone>sit with min facilitation/cueing.  In prone, wt. Bearing on elbows with chest/head lift for scapular depression/stabilization.   In sitting, bilateral low-range functional reach for RUE stabilization/AAROM from in small range with min facilitation and cueing for normal movement patterns.       OT Short Term Goals - 11/17/19 1321      OT SHORT TERM GOAL #1   Title  Pt/husband will be independent with initial HEP for RUE ROM--check STGs 10/14/19  Time  6    Period  Weeks    Status  Achieved      OT SHORT TERM GOAL #2   Title  Pt will perform UB dressing with min A.    Time  6    Period  Weeks    Status  Achieved   MOD ASSIST.  11/11/19:  pt able to don/doff zip-up fleece mod I.  11/17/19:  met per husband report     OT SHORT TERM GOAL #3   Title  Pt will perform UB bathing with mod A.    Time  6    Period  Weeks    Status  Achieved      OT SHORT TERM GOAL #4   Title  Pt will be able to brush hair with set-up.    Time  6    Period  Weeks    Status  On-going   fluctuating assist needed     OT SHORT TERM GOAL #5   Title  Pt will demo at least 110* shoulder flex PROM in supine without pain for  incr ease for ADLs.    Time  6    Period  Weeks    Status  Achieved        OT Long Term Goals - 11/24/19 1455      OT LONG TERM GOAL #1   Title  Pt/husband will be independent with HEP for functional activities for incr participation in IADLs/leisure tasks and for cognition (and incorporating RUE if able).--check LTGs 12/01/19    Time  12    Period  Weeks    Status  On-going      OT LONG TERM GOAL #2   Title  Pt will perform UB dressing with supervision.    Time  12    Period  Weeks    Status  Achieved   11/24/19     OT LONG TERM GOAL #3   Title  Pt will perform LB dressing with mod A.    Time  12    Period  Weeks    Status  Achieved   11/17/19:  min A per husband report     OT LONG TERM GOAL #4   Title  Pt will perform UB bathing with min A.    Time  12    Period  Weeks    Status  Achieved   11/17/19:  met per husband report     OT LONG TERM GOAL #5   Title  Pt will perform simple cold snack prep from w/c level with min A/set up.    Time  12    Period  Weeks    Status  New   11/24/19:  not performing, not using w/c in the home anymore     OT LONG TERM GOAL #6   Title  Pt will perform toileting with mod A.    Time  12    Period  Weeks    Status  Achieved   11/24/19:  CGA/close supervision for ambulation/standing per aide report           Plan - 11/24/19 1451    Clinical Impression Statement  Pt with improving consistency with RUE activation/range with AAROM, closed-chain activities, and wt. bearing today.  Pt demo improved shoulder horizontal abduction AAROM initiation today.    Occupational performance deficits (Please refer to evaluation for details):  ADL's;IADL's;Leisure;Work;Social Participation    Body Structure / Function / Physical Skills  ADL;ROM;IADL;Sensation;Mobility;Strength;Tone;UE  functional use;Decreased knowledge of precautions;Decreased knowledge of use of DME;Coordination;Balance;FMC;GMC    Cognitive Skills   Attention;Temperament/Personality;Understand;Thought;Safety Awareness    Comorbidities Affecting Occupational Performance:  May have comorbidities impacting occupational performance    OT Frequency  2x / week    OT Duration  12 weeks    OT Treatment/Interventions  Self-care/ADL training;Moist Heat;DME and/or AE instruction;Splinting;Therapeutic activities;Aquatic Therapy;Cognitive remediation/compensation;Therapeutic exercise;Cryotherapy;Neuromuscular education;Functional Mobility Training;Passive range of motion;Visual/perceptual remediation/compensation;Patient/family education;Manual Therapy;Electrical Stimulation;Fluidtherapy    Plan  continue with closed-chain activities, neuro-re ed; renewal    Consulted and Agree with Plan of Care  Patient;Family member/caregiver    Family Member Consulted  husband       Patient will benefit from skilled therapeutic intervention in order to improve the following deficits and impairments:   Body Structure / Function / Physical Skills: ADL, ROM, IADL, Sensation, Mobility, Strength, Tone, UE functional use, Decreased knowledge of precautions, Decreased knowledge of use of DME, Coordination, Balance, FMC, GMC Cognitive Skills: Attention, Temperament/Personality, Understand, Thought, Safety Awareness     Visit Diagnosis: Hemiplegia and hemiparesis following nontraumatic intracerebral hemorrhage affecting right dominant side (HCC)  Other disturbances of skin sensation  Unspecified symptoms and signs involving cognitive functions following other nontraumatic intracranial hemorrhage  Other abnormalities of gait and mobility  Muscle weakness (generalized)    Problem List Patient Active Problem List   Diagnosis Date Noted  . Nontraumatic subcortical hemorrhage of left cerebral hemisphere ([place]) 08/27/2019  . Acute blood loss anemia   . Hypoalbuminemia due to protein-calorie malnutrition ([place])   . Thrombocytopenia ([place])   . Dysphagia 07/29/2019  .  Infarction of left basal ganglia ([place]) 07/18/2019  . Hypernatremia   . Leukocytosis   . Essential hypertension   . Global aphasia   . Cytotoxic brain edema ([place]) 07/10/2019  . ICH (intracerebral hemorrhage) ([place]) 07/09/2019  . Anxiety state 02/21/2015  . Cephalalgia 12/21/2014    [facility] 11/24/2019, 3:02 PM  [facility] [address] [place], [place], [address] Phone: [phone]   Fax:  [phone]  Name: Melanie Madden MRN: [id number] Date of Birth: [birth year]   Vianne Bulls, OTR/L [facility] [address] [place], [place]  [address] [phone] phone [phone] 11/24/19 3:02 PM

## 2019-11-27 ENCOUNTER — Other Ambulatory Visit: Payer: Self-pay

## 2019-11-27 ENCOUNTER — Ambulatory Visit: Payer: Managed Care, Other (non HMO)

## 2019-11-27 ENCOUNTER — Ambulatory Visit: Payer: Managed Care, Other (non HMO) | Admitting: Occupational Therapy

## 2019-11-27 ENCOUNTER — Encounter: Payer: Self-pay | Admitting: Occupational Therapy

## 2019-11-27 DIAGNOSIS — R208 Other disturbances of skin sensation: Secondary | ICD-10-CM

## 2019-11-27 DIAGNOSIS — M6281 Muscle weakness (generalized): Secondary | ICD-10-CM

## 2019-11-27 DIAGNOSIS — I69219 Unspecified symptoms and signs involving cognitive functions following other nontraumatic intracranial hemorrhage: Secondary | ICD-10-CM

## 2019-11-27 DIAGNOSIS — R2689 Other abnormalities of gait and mobility: Secondary | ICD-10-CM

## 2019-11-27 DIAGNOSIS — I69151 Hemiplegia and hemiparesis following nontraumatic intracerebral hemorrhage affecting right dominant side: Secondary | ICD-10-CM

## 2019-11-27 DIAGNOSIS — R482 Apraxia: Secondary | ICD-10-CM

## 2019-11-27 DIAGNOSIS — R41841 Cognitive communication deficit: Secondary | ICD-10-CM

## 2019-11-27 DIAGNOSIS — R1310 Dysphagia, unspecified: Secondary | ICD-10-CM

## 2019-11-27 DIAGNOSIS — R4701 Aphasia: Secondary | ICD-10-CM

## 2019-11-27 NOTE — Therapy (Signed)
[facility] [address] [place], [place], [address] Phone: [phone]   Fax:  [phone]  Physical Therapy Treatment  Patient Details  Name: Melanie Madden MRN: [id number] Date of Birth: [birth year] Referring Provider (PT): Lauraine Rinne, [place]   Encounter Date: 11/27/2019  PT End of Session - 11/27/19 1110    Visit Number  24    Number of Visits  35    Date for PT Re-Evaluation  12/30/19    Authorization Type  [employer]- 60 VL combined PT/OT/ST - counts as 1 visit if seen on same day.  Not sure if 60 is hard max.  $50 copay each day - make sure PT/OT/ST on same day if possible    Authorization - Visit Number  --   PT/OT/ST days combined   Authorization - Number of Visits  60    PT Start Time  1105    PT Stop Time  1144    PT Time Calculation (min)  39 min    Equipment Utilized During Treatment  Gait belt   pt's R AFO; dance shoe cover to aid in foot clearance   Activity Tolerance  Patient tolerated treatment well    Behavior During Therapy  Impulsive;Agitated       Past Medical History:  Diagnosis Date  . Allergy   . Anxiety   . Arthritis   . High triglycerides   . Kidney stones   . Migraines   . Recurrent sinus infections     Past Surgical History:  Procedure Laterality Date  . ANTERIOR CRUCIATE LIGAMENT REPAIR Left   . BUNIONECTOMY Right   . LASIK    . LITHOTRIPSY    . MENISCUS REPAIR Left   . NASAL SEPTUM SURGERY    . SHOULDER SURGERY    . VARICOSE VEIN SURGERY Left     There were no vitals filed for this visit.  Subjective Assessment - 11/27/19 1110    Subjective  Pt just fnished with ST. No new reports from caregiver.    Patient is accompained by:  --   Caregivers   Pertinent History  anxiety, migraines, HTN    Patient Stated Goals  Per husband report they would like her to get stronger on right, improve mobility, and work on speech.    Currently in Pain?  No/denies                        [facility] Adult PT Treatment/Exercise - 11/27/19 1110      Transfers   Transfers  Sit to Stand;Stand to [employer] Transfers    Sit to Stand  4: Min guard    Sit to Stand Details  Verbal cues for technique    Stand to Sit  4: Min assist    Stand to Sit Details (indicate cue type and reason)  Verbal cues for precautions/safety;Verbal cues for technique    Stand to Sit Details  Pt continues to need reminders/assist to remove right hand from walker prior to sitting    Stand Pivot Transfers  4: Min guard    Stand Pivot Transfer Details (indicate cue type and reason)  w/c to/from mat      Ambulation/Gait   Ambulation/Gait  Yes    Ambulation/Gait Assistance  3: Mod assist;4: Min assist    Ambulation/Gait Assistance Details  Pt was given verbal cues to increase left step length to try to facilitate more right weight shift. PT assisting at hip to  facilitate right pelvic rotation and hip flexion. Occasional assist at foot to clear back foot of walker.    Ambulation Distance (Feet)  230 Feet   115x 1   Assistive device  Rolling walker   right hand grip attachment, danceskin on right shoe   Gait Pattern  Step-to pattern;Step-through pattern;Decreased hip/knee flexion - right;Poor foot clearance - right;Lateral trunk lean to left;Decreased weight shift to right    Ambulation Surface  Level;Indoor    Pre-Gait Activities  Standing at walker: stepping forward with LLE then weight shifting on to leg with PT stabilizing at knee and trying to facilitate right anterior pelvic rotation as patient was not shifting forward. Verbal cues to try to keep trunk more erect as well. Standing weight shifts to right x 5 with PT assisting at hips. Verbal cues to try to keep shoulders more upright. Stepping forward and back with LLE with PT stabilizing at right knee x 8             PT Education - 11/27/19 1418    Education Details  educated pt and caregiver with her on how to  attach right hand grip attachment to her walker at home that PT issued for her.    Person(s) Educated  Patient;Caregiver(s)    Methods  Explanation;Demonstration    Comprehension  Verbalized understanding       PT Short Term Goals - 10/28/19 2028      PT SHORT TERM GOAL #1   Title  Pt will be able to perform all transfers CGA for improved mobility.    Time  4    Period  Weeks    Status  New    Target Date  12/02/19      PT SHORT TERM GOAL #2   Title  Pt will be able to maintain standing with 1 UE support x 5 min to assist with standing ADLs supervision.    Time  4    Period  Weeks    Status  New    Target Date  12/02/19      PT SHORT TERM GOAL #3   Title  Pt will ambulate >350' with min assist of 1 person with RW with right hand grip attachment so that husband can safely ambulate with her at home.    Time  4    Period  Weeks    Status  New    Target Date  12/02/19        PT Long Term Goals - 10/28/19 2031      PT LONG TERM GOAL #1   Title  Pt will be able to perform progressive HEP for strengthening, balance and mobility with assist of husband to continued gains at home.    Time  8    Period  Weeks    Status  On-going    Target Date  12/30/19      PT LONG TERM GOAL #2   Title  Pt will decrease 5 x sit to stand from 27 sec to <22 sec from edge of mat for improved balance and functional mobility.    Time  8    Period  Weeks    Status  New    Target Date  12/30/19      PT LONG TERM GOAL #3   Title  Pt will ambulate >400' CGA with RW with right hand grip attachment for improved gait ability in home.    Time  8    Period  Weeks    Status  New    Target Date  12/30/19            Plan - 11/27/19 1418    Clinical Impression Statement  Pt was able to show better awareness of RLE with steps today and less impulsivity. She paused to correct at times when foot did not clear fully. PT able to assist with providing more assist at hip/pelvis to faciliate hip flexion.  Issued right hand grip attachment to patient for home.    Personal Factors and Comorbidities  Comorbidity 3+    Comorbidities  anxiety, migraines, HTN    Examination-Activity Limitations  Bathing;Bed Mobility;Stand;Locomotion Level;Squat;Stairs;Dressing;Transfers    Examination-Participation Restrictions  Community Activity;Driving;Shop;Laundry;Meal Prep    Stability/Clinical Decision Making  Evolving/Moderate complexity    Rehab Potential  Fair    PT Frequency  2x / week    PT Duration  8 weeks    PT Treatment/Interventions  ADLs/Self Care Home Management;Electrical Stimulation;DME Instruction;Gait training;Stair training;Functional mobility training;Therapeutic activities;Therapeutic exercise;Orthotic Fit/Training;Patient/family education;Neuromuscular re-education;Balance training;Manual techniques;Passive range of motion;Vestibular;Wheelchair mobility training    PT Next Visit Plan  STG check next visit. Did schedule get fixed? Need to scheduled through June. Danceskin to front of right shoe to help slide. Continue with activities in standing to facilitate increased right weight shift and R hip flexion/initiation of R swing phase, continue gait training with RW with right hand grip attachment.  Pt responded well to short bout on SciFit and may benefit for use to help wtih RLE strength, NMR.    Consulted and Agree with Plan of Care  Patient    Family Member Consulted  Caregiver       Patient will benefit from skilled therapeutic intervention in order to improve the following deficits and impairments:  Abnormal gait, Decreased activity tolerance, Decreased balance, Decreased cognition, Decreased knowledge of use of DME, Decreased mobility, Decreased range of motion, Decreased safety awareness, Decreased strength, Impaired sensation  Visit Diagnosis: Other abnormalities of gait and mobility  Muscle weakness (generalized)     Problem List Patient Active Problem List   Diagnosis Date Noted   . Nontraumatic subcortical hemorrhage of left cerebral hemisphere ([place]) 08/27/2019  . Acute blood loss anemia   . Hypoalbuminemia due to protein-calorie malnutrition ([place])   . Thrombocytopenia ([place])   . Dysphagia 07/29/2019  . Infarction of left basal ganglia ([facility] [place]) 07/18/2019  . Hypernatremia   . Leukocytosis   . Essential hypertension   . Global aphasia   . Cytotoxic brain edema ([place]) 07/10/2019  . ICH (intracerebral hemorrhage) ([place]) 07/09/2019  . Anxiety state 02/21/2015  . Cephalalgia 12/21/2014    Electa Sniff, PT, DPT, NCS 11/27/2019, 2:21 PM  [facility] [address] [place], [place], [address] Phone: [phone]   Fax:  [phone]  Name: Melanie Madden MRN: [id number] Date of Birth: 1965/10/03

## 2019-11-27 NOTE — Therapy (Signed)
[facility] [address] [place], [place], [address] Phone: [phone]   Fax:  [phone]  Occupational Therapy Treatment  Patient Details  Name: Melanie Madden MRN: [id number] Date of Birth: 05-28-1966 Referring Provider (OT): Dr. Alysia Penna   Encounter Date: 11/27/2019  OT End of Session - 11/27/19 0933    Visit Number  24    Number of Visits  40   24+16=40   Date for OT Re-Evaluation  01/27/20    Authorization Type  [employer] 2021:  No prior auth OT, 60 DAY visit limit (regardless of how many therapies each day).    Authorization - Visit Number  27    Authorization - Number of Visits  66    OT Start Time  7948    OT Stop Time  1015    OT Time Calculation (min)  40 min    Activity Tolerance  Patient tolerated treatment well    Behavior During Therapy  Restless;Impulsive       Past Medical History:  Diagnosis Date  . Allergy   . Anxiety   . Arthritis   . High triglycerides   . Kidney stones   . Migraines   . Recurrent sinus infections     Past Surgical History:  Procedure Laterality Date  . ANTERIOR CRUCIATE LIGAMENT REPAIR Left   . BUNIONECTOMY Right   . LASIK    . LITHOTRIPSY    . MENISCUS REPAIR Left   . NASAL SEPTUM SURGERY    . SHOULDER SURGERY    . VARICOSE VEIN SURGERY Left     There were no vitals filed for this visit.  Subjective Assessment - 11/27/19 0933    Patient is accompanied by:  --   aide   Pertinent History  Subcortical hemorrhage of R cerbral hemisphere.  PMH:  HTN, anxiety, migraines, hx of shoulder injection approx 2 weeks ago for R shoulder pain, hx of R shoulder surgery (arthroscopic)    Limitations  aphasia, fall risk, impulsive (husband reports hx of OCD)    Patient Stated Goals  husband reports: to be able to care for herself more (pt indicates that she onces to return to previous activities per questioning/gestures)    Currently in Pain?  No/denies            transfer w/c>mat and mat>w/c with CGA.    In sitting, light wt bearing with body on arm movements (LUE reach to targets) to incr activation RUE/trunk with min facilitation and cueing normal movement patterns.    Light wt. Bearing/AAROM shoulder flex/elbow ext with tilted stool with min-mod facilitation today  AAROM shoulder horizontal abduction/adduction towel slide with min-mod facilitation/cueing then in shoulder flex/elbow ext to with min facilitation/cues.  Sitting, wt. Bearing on R elbow with push to sitting for incr scapular depression/stabilization, with min-mod facilitation/cueing.   In sitting, bilateral low-range functional reach for RUE stabilization/AAROM from in small range with min facilitation and cueing for normal movement patterns.   Therapist assisted low-range supported reach AAROM to grasp/release bottle with mod facilitation/cues.    Scapular retraction/depression bilaterally with light wt bearing on mat/UEs positioned in ER as able with min facilitation/cueing.     OT Short Term Goals - 11/27/19 1216      OT SHORT TERM GOAL #1   Title  Pt/husband will be independent with initial HEP for RUE ROM--check STGs 10/14/19    Time  6    Period  Weeks    Status  Achieved  OT SHORT TERM GOAL #2   Title  Pt will perform UB dressing with min A.    Time  6    Period  Weeks    Status  Achieved   MOD ASSIST.  11/11/19:  pt able to don/doff zip-up fleece mod I.  11/17/19:  met per husband report     OT SHORT TERM GOAL #3   Title  Pt will perform UB bathing with mod A.    Time  6    Period  Weeks    Status  Achieved      OT SHORT TERM GOAL #4   Title  Pt will be able to brush hair with set-up.    Time  4    Period  Weeks    Status  On-going   fluctuating assist needed   Target Date  12/27/19      OT SHORT TERM GOAL #5   Title  Pt will demo at least 110* shoulder flex PROM in supine without pain for incr ease for ADLs.    Time  6    Period  Weeks     Status  Achieved      OT SHORT TERM GOAL #6   Title  Pt will be able to stabilze light object in [place] for low level bilateral reach.--check updated STGs 12/27/19    Time  4    Period  Weeks    Status  New    Target Date  12/27/19      OT SHORT TERM GOAL #7   Title  Pt will be able to use RUE as a stablizer consistently for selected tasks mod I.    Time  4    Period  Weeks    Status  New    Target Date  12/27/19        OT Long Term Goals - 11/27/19 1205      OT LONG TERM GOAL #1   Title  Pt/husband will be independent with HEP for functional activities for incr participation in IADLs/leisure tasks (and incorporating RUE if able).--check updated LTGs 01/27/20    Time  8    Period  Weeks    Status  On-going      OT LONG TERM GOAL #2   Title  Pt will perform UB dressing with supervision.    Time  12    Period  Weeks    Status  Achieved   11/24/19     OT LONG TERM GOAL #3   Title  Pt will perform LB dressing with mod A.    Time  12    Period  Weeks    Status  Achieved   11/17/19:  min A per husband report     OT LONG TERM GOAL #4   Title  Pt will perform UB bathing with min A.    Time  12    Period  Weeks    Status  Achieved   11/17/19:  met per husband report     OT LONG TERM GOAL #5   Title  Pt will perform simple cold snack prep  with min A/set up.    Time  8    Period  Weeks    Status  Revised   11/24/19:  not performing, not using w/c in the home anymore     OT LONG TERM GOAL #6   Title  Pt will perform toileting with mod A.    Time  12  Period  Weeks    Status  Achieved   11/24/19:  CGA/close supervision for ambulation/standing per aide report     OT LONG TERM GOAL #7   Title  Pt will demo at least 20* active shoulder flex in prep for functional low-level reach.    Time  8    Period  Weeks    Status  New      OT LONG TERM GOAL #8   Title  Pt will be able to weight bear through RUE in standing at counter to assist with ADL/IADL tasks.    Time  8     Period  Weeks    Status  New            Plan - 11/27/19 0934    Clinical Impression Statement  Pt is progressing slowly with improving incr activation of RUE, but continues to be inconsistent at times with internal distractions.  Pt has made good progress with BADLs, but would benefit from continued occupational therapy to [place] functional use as able and address IADLs.    Occupational performance deficits (Please refer to evaluation for details):  ADL's;IADL's;Leisure;Work;Social Participation    Body Structure / Function / Physical Skills  ADL;ROM;IADL;Sensation;Mobility;Strength;Tone;UE functional use;Decreased knowledge of precautions;Decreased knowledge of use of DME;Coordination;Balance;FMC;GMC    Cognitive Skills  Attention;Temperament/Personality;Understand;Thought;Safety Awareness    Rehab Potential  Good    Comorbidities Affecting Occupational Performance:  May have comorbidities impacting occupational performance    OT Frequency  2x / week    OT Duration  8 weeks   may be modified due to scheduling needs   OT Treatment/Interventions  Self-care/ADL training;Moist Heat;DME and/or AE instruction;Splinting;Therapeutic activities;Aquatic Therapy;Cognitive remediation/compensation;Therapeutic exercise;Cryotherapy;Neuromuscular education;Functional Mobility Training;Passive range of motion;Visual/perceptual remediation/compensation;Patient/family education;Manual Therapy;Electrical Stimulation;Fluidtherapy    Plan  continue with closed-chain activities, neuro-re ed; renewal completed 11/27/19    Consulted and Agree with Plan of Care  Patient    Family Member Consulted  --       Patient will benefit from skilled therapeutic intervention in order to improve the following deficits and impairments:   Body Structure / Function / Physical Skills: ADL, ROM, IADL, Sensation, Mobility, Strength, Tone, UE functional use, Decreased knowledge of precautions, Decreased knowledge of use of DME,  Coordination, Balance, FMC, GMC Cognitive Skills: Attention, Temperament/Personality, Understand, Thought, Safety Awareness     Visit Diagnosis: Hemiplegia and hemiparesis following nontraumatic intracerebral hemorrhage affecting right dominant side (HCC)  Other abnormalities of gait and mobility  Muscle weakness (generalized)  Other disturbances of skin sensation  Unspecified symptoms and signs involving cognitive functions following other nontraumatic intracranial hemorrhage    Problem List Patient Active Problem List   Diagnosis Date Noted  . Nontraumatic subcortical hemorrhage of left cerebral hemisphere ([place]) 08/27/2019  . Acute blood loss anemia   . Hypoalbuminemia due to protein-calorie malnutrition ([place])   . Thrombocytopenia ([place])   . Dysphagia 07/29/2019  . Infarction of left basal ganglia ([place]) 07/18/2019  . Hypernatremia   . Leukocytosis   . Essential hypertension   . Global aphasia   . Cytotoxic brain edema ([place]) 07/10/2019  . ICH (intracerebral hemorrhage) ([place]) 07/09/2019  . Anxiety state 02/21/2015  . Cephalalgia 12/21/2014    [facility] 11/27/2019, 2:29 PM  [facility] [address] [place], [place], [address] Phone: [phone]   Fax:  [phone]  Name: Melanie Madden MRN: [id number] Date of Birth: 1966-06-27   Vianne Bulls, OTR/L [facility]  [place], [place]  [address] [phone] phone [phone] 11/27/19 2:29 PM   '

## 2019-11-27 NOTE — Therapy (Signed)
[facility] [address] [place], [place], [address] Phone: [phone]   Fax:  [phone]  Speech Language Pathology Treatment  Patient Details  Name: Melanie Madden MRN: [id number] Date of Birth: Mar 08, 1966 Referring Provider (SLP): Alysia Penna, MD   Encounter Date: 11/27/2019  End of Session - 11/27/19 1705    Visit Number  25    Number of Visits  41    Date for SLP Re-Evaluation  02/26/20    Authorization - Number of Visits  60   days   SLP Start Time  1443    SLP Stop Time   1100    SLP Time Calculation (min)  42 min    Activity Tolerance  Patient tolerated treatment well       Past Medical History:  Diagnosis Date  . Allergy   . Anxiety   . Arthritis   . High triglycerides   . Kidney stones   . Migraines   . Recurrent sinus infections     Past Surgical History:  Procedure Laterality Date  . ANTERIOR CRUCIATE LIGAMENT REPAIR Left   . BUNIONECTOMY Right   . LASIK    . LITHOTRIPSY    . MENISCUS REPAIR Left   . NASAL SEPTUM SURGERY    . SHOULDER SURGERY    . VARICOSE VEIN SURGERY Left     There were no vitals filed for this visit.  Subjective Assessment - 11/27/19 1551    Subjective  Pt arrived with caregiver. Pt shook her head when greeted by SLP.    Currently in Pain?  No/denies            ADULT SLP TREATMENT - 11/27/19 1552      General Information   Behavior/Cognition  Alert;Cooperative;Impulsive;Requires cueing      Treatment Provided   Treatment provided  Cognitive-Linquistic      Cognitive-Linquistic Treatment   Treatment focused on  Apraxia;Aphasia    Skilled Treatment  Pt is using Lingraphica for primarily 2-3 hours of practice each day. Tells Aide what she wants for breakfast and when aide cues pt to use Lingraphica - she will use it occasionally , and occasionally refuse due to low frustration tolerance. SLP told pt today to use Lingraphica for communiation and  computer for practice. SLP worked with pt today on [place] - pt req'd max cues for spelling and for awareness of errors needing backspace. Pt communicated more with aide today with gestures x4 (general, random with aide asking yes/no ?s) than with device 0x. SLP to phone pt's husband today and inquire how much device is used at home, as well as feasibility of taking May off as pt only scheduled x4. This would give pt'scognitive deficits time or more healing as well, as her impulsivity and decr'd attention are negative prognosic indicators. SLP used written key word cues during conversation with pt to facilitate pt auditory comprehension.       Progression Toward Goals   Progression toward goals  Not progressing toward goals (comment)   ? pt use of Lingraphica other than practice      SLP Education - 11/27/19 1614    Education Details  device is for communication and not practice - use cpmuter/app for practice, basic programming    Person(s) Educated  Patient;Caregiver(s)    Methods  Explanation;Demonstration    Comprehension  Verbalized understanding;Returned demonstration;Need further instruction;Verbal cues required       SLP Short Term Goals - 11/27/19 1710  SLP SHORT TERM GOAL #1   Title  pt will simultaneously with SLP produce 3 different phonemes 50% of the time over three sessions    Status  Partially Met      SLP SHORT TERM GOAL #2   Title  pt will ID objects salient to pt in f:4 80% of the time over 3 sessions    Status  Deferred      SLP SHORT TERM GOAL #3   Title  pt will write her full name on first attempt over three sessions    Time  1    Status  Deferred      SLP SHORT TERM GOAL #4   Title  pt will attempt expressive communication via multimodal means during 5 sessions    Status  Not Met      SLP SHORT TERM GOAL #5   Title  pt will demonstrate undertanding of simple 1-step functional commands with 70% accuracy with multimodal cues over 3 sessions     Status  Not Met      Additional Short Term Goals   Additional Short Term Goals  Yes      SLP SHORT TERM GOAL #6   Title  pt will look at object or line drawing and match the correct word from f:6 words 90% success over 3 sessions    Status  Deferred      SLP SHORT TERM GOAL #7   Title  pt will demo knowledge of pertinent /functional information placement on speech generating device 75% with rare min A    Time  4    Period  Weeks    Status  New      SLP SHORT TERM GOAL #8   Title  pt will demonstrate how to generate a new icon with written cues for procedure (max A for spelling) x3 sessions    Time  4    Period  Weeks    Status  New      SLP SHORT TERM GOAL  #9   TITLE  pt will communicate simple information (places, people, food, etc) using Lingraphica with occasional min a for navigation x3 sessions    Time  4    Period  Weeks    Status  New       SLP Long Term Goals - 11/27/19 1707      SLP LONG TERM GOAL #1   Title  pt's diet will be appropriately upgraded as needed by SLP    Period  --   or 25 total visits, for all LTGs   Status  Deferred   pt eating preferred diet without overt s/sx aspiration PNA     SLP LONG TERM GOAL #2   Title  pt will produce 3 simple words/words pertinent to pt (family or pet names, etc) simultaneously with SLP and multimodal cues over three sessions    Status  Not Met      SLP LONG TERM GOAL #3   Title  pt will demo understanding of simple yes/no questions pertinent to desires/wants with multimodal cues 90% of the time over 3 sessions    Status  Not Met      SLP LONG TERM GOAL #4   Title  a device trial with a speech generating device will be initiated within the first 20 ST visits    Status  Achieved      SLP LONG TERM GOAL #5   Title  pt will demo recognition of written  word for a common object 100% success over 3 sessions    Baseline  11-13-19, 11-20-19    Status  Achieved      Additional Long Term Goals   Additional Long Term  Goals  Yes      SLP LONG TERM GOAL #6   Title  pt will work on speech-related tasks at home between 75% of sessions as reported by family and/or noted on [facility] website    Baseline  10-21-19, 10-26-19, 10-28-19, 11-11-19, 11-13-19, 11-20-19, 11-24-19, 11-27-19    Status  Partially Met      SLP LONG TERM GOAL #7   Title  pt will demo knowledge of specific pertinent /functional information placement on speech generating device 90%    Time  8    Period  Weeks   or 41 total sessions, for all LTGs   Status  New      SLP LONG TERM GOAL #8   Title  pt will demonstrate how to generate a new icon with occasional min A for procedure (max A for spelling) x3 sessions    Time  8    Period  Weeks    Status  New      SLP LONG TERM GOAL  #9   TITLE  pt will communicate feelings or other personal mod complex information using Lingraphica with rare min a for navigation x3 sessions    Time  8    Period  Weeks    Status  New       Plan - 11/27/19 1659    Clinical Impression Statement  Pt expressive language cont non-functional. She cont to demo cognitive communication deficits in at least the areas of attention (incr'd impulsivity) and awareness (appears unaware of some verbal deficits and her receptive languge deficit). Pt also cont to present with a severe/profound expressive aphasia with verbal apraxia, and mod receptive aphasia ("global aphasia"). A device trial with Lingraphica will end next week and then device will be requested again when pt's schedule returns to x2/week in June. Pt to go on hold from [facility] until first week in June. See "skilled treatment" for more details of today's session. Pt may cont to benefit from skilled ST focusing on incr'ing communicative effectiveness via multimodal means (speech generating device) and incr'ing functional communication (verbal or nonverbal), incr'ing cognition in functional ways as able given pt's language deficit, and monitoring/upgrading pt's diet as  appropriate.    Speech Therapy Frequency  2x / week   on hold until June 2021   Duration  --   8 weeks   Treatment/Interventions  Diet toleration management by SLP;Trials of upgraded texture/liquids;Language facilitation;Internal/external aids;Cognitive reorganization;Cueing hierarchy;SLP instruction and feedback;Patient/family education;Compensatory strategies;Multimodal communcation approach;Functional tasks;Environmental controls    Potential to Achieve Goals  Fair    Potential Considerations  Severity of impairments;Cooperation/participation level       Patient will benefit from skilled therapeutic intervention in order to improve the following deficits and impairments:   Verbal apraxia  Aphasia  Cognitive communication deficit  Dysphagia, unspecified type    Problem List Patient Active Problem List   Diagnosis Date Noted  . Nontraumatic subcortical hemorrhage of left cerebral hemisphere ([facility]) 08/27/2019  . Acute blood loss anemia   . Hypoalbuminemia due to protein-calorie malnutrition ([facility])   . Thrombocytopenia ([place])   . Dysphagia 07/29/2019  . Infarction of left basal ganglia ([facility]) 07/18/2019  . Hypernatremia   . Leukocytosis   . Essential hypertension   . Global aphasia   .  Cytotoxic brain edema ([facility]) 07/10/2019  . ICH (intracerebral hemorrhage) ([facility]) 07/09/2019  . Anxiety state 02/21/2015  . Cephalalgia 12/21/2014    [place] ,[place], [place]  11/27/2019, 5:18 PM  [facility] [address] [place], [place], [address] Phone: [phone]   Fax:  [phone]   Name: Melanie Madden MRN: [id number] Date of Birth: 1966-06-11

## 2019-11-30 ENCOUNTER — Telehealth: Payer: Self-pay

## 2019-11-30 NOTE — Telephone Encounter (Signed)
Melanie Madden husband of patient called stating patient needs a refill on Seroquel. Is this ok to refill?

## 2019-12-01 MED ORDER — QUETIAPINE FUMARATE 25 MG PO TABS: 25.0000 mg | ORAL_TABLET | Freq: Two times a day (BID) | ORAL | 1 refills | Status: DC

## 2019-12-01 NOTE — Telephone Encounter (Signed)
Ok to refill seroquel

## 2019-12-01 NOTE — Telephone Encounter (Signed)
Medication ordered, patients caregiver notified

## 2019-12-02 ENCOUNTER — Ambulatory Visit: Payer: Managed Care, Other (non HMO)

## 2019-12-02 ENCOUNTER — Ambulatory Visit: Payer: Managed Care, Other (non HMO) | Attending: Physical Medicine and Rehabilitation

## 2019-12-02 ENCOUNTER — Other Ambulatory Visit: Payer: Self-pay

## 2019-12-02 DIAGNOSIS — I69219 Unspecified symptoms and signs involving cognitive functions following other nontraumatic intracranial hemorrhage: Secondary | ICD-10-CM | POA: Insufficient documentation

## 2019-12-02 DIAGNOSIS — R482 Apraxia: Secondary | ICD-10-CM | POA: Diagnosis present

## 2019-12-02 DIAGNOSIS — I69151 Hemiplegia and hemiparesis following nontraumatic intracerebral hemorrhage affecting right dominant side: Secondary | ICD-10-CM | POA: Diagnosis present

## 2019-12-02 DIAGNOSIS — R2689 Other abnormalities of gait and mobility: Secondary | ICD-10-CM | POA: Insufficient documentation

## 2019-12-02 DIAGNOSIS — M6281 Muscle weakness (generalized): Secondary | ICD-10-CM | POA: Diagnosis present

## 2019-12-02 DIAGNOSIS — R41841 Cognitive communication deficit: Secondary | ICD-10-CM | POA: Diagnosis present

## 2019-12-02 DIAGNOSIS — R209 Unspecified disturbances of skin sensation: Secondary | ICD-10-CM | POA: Insufficient documentation

## 2019-12-02 DIAGNOSIS — R4701 Aphasia: Secondary | ICD-10-CM | POA: Diagnosis present

## 2019-12-02 NOTE — Therapy (Signed)
[facility] [address] [place], [place], [address] Phone: [phone]   Fax:  [phone]  Physical Therapy Treatment  Patient Details  Name: Melanie Madden MRN: [id number] Date of Birth: May 28, 1966 Referring Provider (PT): Lauraine Rinne, [place]   Encounter Date: 12/02/2019  PT End of Session - 12/02/19 1539    Visit Number  25    Number of Visits  35    Date for PT Re-Evaluation  12/30/19    Authorization Type  [employer]- 60 VL combined PT/OT/ST - counts as 1 visit if seen on same day.  Not sure if 60 is hard max.  $50 copay each day - make sure PT/OT/ST on same day if possible    Authorization - Visit Number  --   PT/OT/ST days combined   Authorization - Number of Visits  60    PT Start Time  1539    PT Stop Time  1617    PT Time Calculation (min)  38 min    Equipment Utilized During Treatment  Gait belt   pt's R AFO; dance shoe cover to aid in foot clearance   Activity Tolerance  Patient tolerated treatment well    Behavior During Therapy  Impulsive;Agitated       Past Medical History:  Diagnosis Date  . Allergy   . Anxiety   . Arthritis   . High triglycerides   . Kidney stones   . Migraines   . Recurrent sinus infections     Past Surgical History:  Procedure Laterality Date  . ANTERIOR CRUCIATE LIGAMENT REPAIR Left   . BUNIONECTOMY Right   . LASIK    . LITHOTRIPSY    . MENISCUS REPAIR Left   . NASAL SEPTUM SURGERY    . SHOULDER SURGERY    . VARICOSE VEIN SURGERY Left     There were no vitals filed for this visit.  Subjective Assessment - 12/02/19 1540    Subjective  Pt's husband present with her. They have tried some walking since last visit.    Patient is accompained by:  --   Caregivers   Pertinent History  anxiety, migraines, HTN    Patient Stated Goals  Per husband report they would like her to get stronger on right, improve mobility, and work on speech.    Currently in Pain?  No/denies                        [facility] Adult PT Treatment/Exercise - 12/02/19 1541      Transfers   Transfers  Sit to Stand;Stand to Sit;Stand Pivot Transfers    Sit to Stand  5: Supervision;4: Min guard    Sit to Stand Details  Verbal cues for technique    Sit to Stand Details (indicate cue type and reason)  Pt performed sit to stand from mat without walker CGA and supervision at walker. Pt was cued to place right hand in grip attachment and was able to secure herself    Stand to Sit  5: Supervision    Stand to Sit Details (indicate cue type and reason)  Visual cues/gestures for precautions/safety;Verbal cues for technique    Stand to Sit Details  Pt continues to forget to release right hand prior to sitting. Explained to patient why this is one reason she should not be getting up by herself as needs someone for safety for this reason as well as others.    Stand Pivot Transfers  4: Min Surveyor, minerals Details (indicate cue type and reason)  mat to/from w/c with RW    Comments  Pt stood at walker supervision x 5 min      Ambulation/Gait   Ambulation/Gait Assistance  3: Mod assist    Ambulation/Gait Assistance Details  Pt was given tactile cues at right pelvis/hip to help to initiate right hip flexion and keep leg from abducted/ER with swing. PT also assisted on left trunk to try to get more upright posture and prevent left lateral lean. Pt was swatting therapist's hand on trunk some. PT explained why it was important to try to get more upright and not use lean to advance right leg.    Ambulation Distance (Feet)  230 Feet    Assistive device  Rolling walker   right hand grip attachment, right AFO   Gait Pattern  Step-through pattern;Decreased hip/knee flexion - right;Poor foot clearance - right    Ambulation Surface  Level;Indoor      Knee/Hip Exercises: Aerobic   Other Aerobic  SciFit x 6 min level 1 with PT stabilizing at right knee to prevent ER of hip.              PT Education - 12/02/19 1742    Education Details  Pt and husband instructed to walk daily with walker with husband assisting, not by herself. Explained results of STG testing.    Person(s) Educated  Patient;Spouse    Methods  Explanation    Comprehension  Verbalized understanding       PT Short Term Goals - 12/02/19 1743      PT SHORT TERM GOAL #1   Title  Pt will be able to perform all transfers CGA for improved mobility.    Baseline  Pt is able to perform transfers CGA with sit to stand and stand/pivot    Time  4    Period  Weeks    Status  Achieved    Target Date  12/02/19      PT SHORT TERM GOAL #2   Title  Pt will be able to maintain standing with 1 UE support x 5 min to assist with standing ADLs supervision.    Baseline  Pt stood 5 min at walker supervision    Time  4    Period  Weeks    Status  Achieved    Target Date  12/02/19      PT SHORT TERM GOAL #3   Title  Pt will ambulate >350' with min assist of 1 person with RW with right hand grip attachment so that husband can safely ambulate with her at home.    Baseline  230' with RW mod assist.    Time  4    Period  Weeks    Status  Partially Met    Target Date  12/02/19        PT Long Term Goals - 10/28/19 2031      PT LONG TERM GOAL #1   Title  Pt will be able to perform progressive HEP for strengthening, balance and mobility with assist of husband to continued gains at home.    Time  8    Period  Weeks    Status  On-going    Target Date  12/30/19      PT LONG TERM GOAL #2   Title  Pt will decrease 5 x sit to stand from 27 sec to <22 sec from edge of  mat for improved balance and functional mobility.    Time  8    Period  Weeks    Status  New    Target Date  12/30/19      PT LONG TERM GOAL #3   Title  Pt will ambulate >400' CGA with RW with right hand grip attachment for improved gait ability in home.    Time  8    Period  Weeks    Status  New    Target Date  12/30/19             Plan - 12/02/19 1744    Clinical Impression Statement  Pt met 2/3 STGs at visit today. She is able to perform transfers supervision/CGA as needs for safety especially when using RW to remember hand to remove hand grip. Pt was able to stand >5 min at walker supervision with no LOB today. She continues to show improving right leg advancement and less impulsivity with slowing down more with gait. Pt continues to benefit from skilled PT to continue to address strength, balance and functional mobility deficits.    Personal Factors and Comorbidities  Comorbidity 3+    Comorbidities  anxiety, migraines, HTN    Examination-Activity Limitations  Bathing;Bed Mobility;Stand;Locomotion Level;Squat;Stairs;Dressing;Transfers    Examination-Participation Restrictions  Community Activity;Driving;Shop;Laundry;Meal Prep    Stability/Clinical Decision Making  Evolving/Moderate complexity    Rehab Potential  Fair    PT Frequency  2x / week    PT Duration  8 weeks    PT Treatment/Interventions  ADLs/Self Care Home Management;Electrical Stimulation;DME Instruction;Gait training;Stair training;Functional mobility training;Therapeutic activities;Therapeutic exercise;Orthotic Fit/Training;Patient/family education;Neuromuscular re-education;Balance training;Manual techniques;Passive range of motion;Vestibular;Wheelchair mobility training    PT Next Visit Plan  [place] to front of right shoe to help slide. Continue with activities in standing to facilitate increased right weight shift and R hip flexion/initiation of R swing phase, continue gait training with RW with right hand grip attachment.  Pt responded well to short bout on SciFit and may benefit for use to help wtih RLE strength, NMR.    Consulted and Agree with Plan of Care  Patient    Family Member Consulted  Caregiver       Patient will benefit from skilled therapeutic intervention in order to improve the following deficits and impairments:   Abnormal gait, Decreased activity tolerance, Decreased balance, Decreased cognition, Decreased knowledge of use of DME, Decreased mobility, Decreased range of motion, Decreased safety awareness, Decreased strength, Impaired sensation  Visit Diagnosis: Other abnormalities of gait and mobility  Muscle weakness (generalized)     Problem List Patient Active Problem List   Diagnosis Date Noted  . Nontraumatic subcortical hemorrhage of left cerebral hemisphere ([facility]) 08/27/2019  . Acute blood loss anemia   . Hypoalbuminemia due to protein-calorie malnutrition ([facility])   . Thrombocytopenia ([facility])   . Dysphagia 07/29/2019  . Infarction of left basal ganglia ([place]) 07/18/2019  . Hypernatremia   . Leukocytosis   . Essential hypertension   . Global aphasia   . Cytotoxic brain edema ([place]) 07/10/2019  . ICH (intracerebral hemorrhage) ([facility]) 07/09/2019  . Anxiety state 02/21/2015  . Cephalalgia 12/21/2014    Electa Sniff, PT, DPT, NCS 12/02/2019, 5:48 PM  [facility] [address] [place], [place], [address] Phone: [phone]   Fax:  [phone]  Name: Melanie Madden MRN: [id number] Date of Birth: 1965-11-18

## 2019-12-04 ENCOUNTER — Encounter: Payer: Self-pay | Admitting: Physical Medicine & Rehabilitation

## 2019-12-04 ENCOUNTER — Encounter
Payer: Managed Care, Other (non HMO) | Attending: Physical Medicine & Rehabilitation | Admitting: Physical Medicine & Rehabilitation

## 2019-12-04 ENCOUNTER — Other Ambulatory Visit: Payer: Self-pay

## 2019-12-04 VITALS — BP 153/93 | HR 88 | Ht 67.0 in | Wt 124.0 lb

## 2019-12-04 DIAGNOSIS — I610 Nontraumatic intracerebral hemorrhage in hemisphere, subcortical: Secondary | ICD-10-CM | POA: Insufficient documentation

## 2019-12-04 DIAGNOSIS — G8101 Flaccid hemiplegia affecting right dominant side: Secondary | ICD-10-CM | POA: Diagnosis not present

## 2019-12-04 DIAGNOSIS — R4701 Aphasia: Secondary | ICD-10-CM | POA: Diagnosis not present

## 2019-12-04 NOTE — Progress Notes (Signed)
Subjective:    Patient ID: Melanie Madden, female    DOB: February 27, 1966, 54 y.o.   MRN: [id number]  HPI 54 year old female with large left MCA distribution ICH Patient has global aphasia as well as some right spastic hemiplegia with severe motor deficits. She had pre-existing rotator cuff issues and has had some shoulder pain but has not wanted any injections or medications for this. She is currently receiving outpatient PT OT speech She requires 24-hour assistance for her ADLs and mobility.  She is mainly wheelchair-bound.  She does have a right AFO. Pain Inventory Average Pain 0 Pain Right Now 0 My pain is no pain  In the last 24 hours, has pain interfered with the following? General activity 0 Relation with others 0 Enjoyment of life 0 What TIME of day is your pain at its worst? no pain Sleep (in general) Good  Pain is worse with: no pain Pain improves with: no pain Relief from Meds: no pain  Mobility walk with assistance use a wheelchair  Function employed # of hrs/week 0 Do you have any goals in this area?  no  Neuro/Psych weakness trouble walking  Prior Studies Any changes since last visit?  no  Physicians involved in your care Any changes since last visit?  no   Family History  Problem Relation Age of Onset  . Alzheimer's disease Mother   . Hypertension Mother   . Diabetes Mother   . Thyroid disease Mother   . Deep vein thrombosis Maternal Grandmother   . Colon cancer Neg Hx   . Colon polyps Neg Hx   . Esophageal cancer Neg Hx   . Pancreatic cancer Neg Hx   . Rectal cancer Neg Hx   . Stomach cancer Neg Hx   . Breast cancer Neg Hx    Social History   Socioeconomic History  . Marital status: Significant Other    Spouse name: Not on file  . Number of children: Not on file  . Years of education: Not on file  . Highest education level: Not on file  Occupational History  . Not on file  Tobacco Use  . Smoking status: Former Research scientist (life sciences)  . Smokeless  tobacco: Never Used  Substance and Sexual Activity  . Alcohol use: Yes    Alcohol/week: 0.0 standard drinks    Comment: social   . Drug use: No  . Sexual activity: Yes    Partners: Male  Other Topics Concern  . Not on file  Social History Narrative  . Not on file   Social Determinants of Health   Financial Resource Strain:   . Difficulty of Paying Living Expenses:   Food Insecurity:   . Worried About Charity fundraiser in the Last Year:   . Arboriculturist in the Last Year:   Transportation Needs:   . Film/video editor (Medical):   [place] Kitchen Lack of Transportation (Non-Medical):   Physical Activity:   . Days of Exercise per Week:   . Minutes of Exercise per Session:   Stress:   . Feeling of Stress :   Social Connections:   . Frequency of Communication with Friends and Family:   . Frequency of Social Gatherings with Friends and Family:   . Attends Religious Services:   . Active Member of Clubs or Organizations:   . Attends Archivist Meetings:   Marland Kitchen Marital Status:    Past Surgical History:  Procedure Laterality Date  . ANTERIOR CRUCIATE LIGAMENT REPAIR Left   .  BUNIONECTOMY Right   . LASIK    . LITHOTRIPSY    . MENISCUS REPAIR Left   . NASAL SEPTUM SURGERY    . SHOULDER SURGERY    . VARICOSE VEIN SURGERY Left    Past Medical History:  Diagnosis Date  . Allergy   . Anxiety   . Arthritis   . High triglycerides   . Kidney stones   . Migraines   . Recurrent sinus infections    BP (!) 153/93   Pulse 88   SpO2 96%   Opioid Risk Score:   Fall Risk Score:  `1  Depression screen PHQ 2/9  Depression screen PHQ 2/9 09/25/2019  Decreased Interest 0  Down, Depressed, Hopeless 1  PHQ - 2 Score 1  Altered sleeping 0  Tired, decreased energy 0  Change in appetite 0  Feeling bad or failure about yourself  0  Trouble concentrating 0  Moving slowly or fidgety/restless 1  Suicidal thoughts 0  PHQ-9 Score 2  Difficult doing work/chores Somewhat difficult      Review of Systems  Constitutional: Negative.   HENT: Negative.   Eyes: Negative.   Respiratory: Negative.   Cardiovascular: Negative.   Gastrointestinal: Negative.   Endocrine: Negative.   Genitourinary: Negative.   Musculoskeletal: Positive for gait problem.  Skin: Negative.   Hematological: Negative.   Psychiatric/Behavioral: Negative.   All other systems reviewed and are negative.      Objective:   Physical Exam Vitals and nursing note reviewed.  Constitutional:      Appearance: Normal appearance.  Eyes:     Extraocular Movements: Extraocular movements intact.     Conjunctiva/sclera: Conjunctivae normal.  Musculoskeletal:     Comments: Right shoulder subluxation mild pain with right shoulder external rotation as well as abduction  Neurological:     Mental Status: She is alert.     Comments: 0/5 strength in the right upper extremity 2 - hip knee extensor synergy right lower extremity is there an issue about visitation as well.  Tone is reduced right upper limb and right lower limb  Psychiatric:        Mood and Affect: Affect is labile.     Comments: Patient easily angered, does not like 1 MD speaks to caregiver. Patient severely aphasic           Assessment & Plan:  #1.  Right spastic hemiplegia and global aphasia related to left parietal ICH.  Unfortunately very little in terms of neurologic recovery.   2.  Shoulder pain related to pre-existing cuff issues with subluxation related to stroke.  At this time the pain does not appear to be severe with range of motion.  We will continue PT, I also wrote down for the husband that he can trial Voltaren gel or Aspercreme to  right shoulder  Return to clinic 3 months

## 2019-12-04 NOTE — Patient Instructions (Signed)
May try voltaren gel or aspercreme to Right shoulder as needed Avoid sleeping on the right shoulder

## 2019-12-11 ENCOUNTER — Other Ambulatory Visit: Payer: Self-pay

## 2019-12-11 ENCOUNTER — Ambulatory Visit: Payer: Managed Care, Other (non HMO)

## 2019-12-11 DIAGNOSIS — R2689 Other abnormalities of gait and mobility: Secondary | ICD-10-CM

## 2019-12-11 DIAGNOSIS — R208 Other disturbances of skin sensation: Secondary | ICD-10-CM

## 2019-12-11 DIAGNOSIS — M6281 Muscle weakness (generalized): Secondary | ICD-10-CM

## 2019-12-11 DIAGNOSIS — I69151 Hemiplegia and hemiparesis following nontraumatic intracerebral hemorrhage affecting right dominant side: Secondary | ICD-10-CM

## 2019-12-11 NOTE — Therapy (Signed)
[facility] [address] [place], [place], [address] Phone: [phone]   Fax:  [phone]  Physical Therapy Treatment  Patient Details  Name: Melanie Madden MRN: [id number] Date of Birth: 1966-06-22 Referring Provider (PT): Lauraine Rinne, [place]   Encounter Date: 12/11/2019  PT End of Session - 12/11/19 1453    Visit Number  26    Number of Visits  35    Date for PT Re-Evaluation  12/30/19    Authorization Type  [employer]- 60 VL combined PT/OT/ST - counts as 1 visit if seen on same day.  Not sure if 60 is hard max.  $50 copay each day - make sure PT/OT/ST on same day if possible    Authorization - Visit Number  --   PT/OT/ST days combined   Authorization - Number of Visits  60    PT Start Time  1449    PT Stop Time  1533    PT Time Calculation (min)  44 min    Equipment Utilized During Treatment  Gait belt   pt's R AFO; dance shoe cover to aid in foot clearance   Activity Tolerance  Patient tolerated treatment well    Behavior During Therapy  Impulsive;Agitated       Past Medical History:  Diagnosis Date  . Allergy   . Anxiety   . Arthritis   . High triglycerides   . Kidney stones   . Migraines   . Recurrent sinus infections     Past Surgical History:  Procedure Laterality Date  . ANTERIOR CRUCIATE LIGAMENT REPAIR Left   . BUNIONECTOMY Right   . LASIK    . LITHOTRIPSY    . MENISCUS REPAIR Left   . NASAL SEPTUM SURGERY    . SHOULDER SURGERY    . VARICOSE VEIN SURGERY Left     There were no vitals filed for this visit.  Subjective Assessment - 12/11/19 1451    Subjective  Patient and caregiver reports no new issues.    Patient is accompained by:  --   Caregivers   Pertinent History  anxiety, migraines, HTN    Patient Stated Goals  Per husband report they would like her to get stronger on right, improve mobility, and work on speech.    Currently in Pain?  No/denies                         [facility] Adult PT Treatment/Exercise - 12/11/19 0001      Bed Mobility   Bed Mobility  Sit to Supine;Supine to Sit    Supine to Sit  Supervision/Verbal cueing    Sit to Supine  Contact Guard/Touching assist   require assistance with RLE today.      Transfers   Transfers  Sit to Stand;Stand to [employer]  Transfers    Sit to Stand  5: Supervision;4: Min guard    Sit to Stand Details  Verbal cues for technique    Stand to Sit  5: Supervision    Stand to Sit Details (indicate cue type and reason)  Visual cues/gestures for precautions/safety;Verbal cues for technique    Stand to Sit Details  Pt continued to forget to release right hand prior to sitting. With PT asking questions to let patient problem solve, pt able to relaize she left RUE attached to walker.     Stand Pivot Transfers  4: Min guard    Stand Pivot Transfer Details (indicate cue type  and reason)  pt performed stand pivot transfer from wc <> mat with CGA       Ambulation/Gait   Ambulation/Gait  Yes    Ambulation/Gait Assistance  3: Mod assist    Ambulation/Gait Assistance Details  Completed initial gait training with RW with R hand attachment, PT providing tactile cues for improved hip/knee flexion and PT also assisted on left trunk to try to get more upright posture and prevent left lateral lean. Pt continues to demonstrate hitting RLE on posterior R leg of walker, requring min A for placement from PT    Ambulation Distance (Feet)  230 Feet   100 ft w/o AD.   Assistive device  Rolling walker    Gait Pattern  Step-through pattern;Decreased hip/knee flexion - right;Poor foot clearance - right    Ambulation Surface  Level;Indoor    Gait Comments  Completed gait training x 100 ft w/o AD and provided Mod A. Pt holding hand to provide some support, with patient demonstrating improved pattern. Still require verbal cues for improved step length.       Neuro Re-ed    Neuro Re-ed Details   Seated  with right foot on foam roll trying to ext/flex knee x 5 with min assist from PT. In hook lying, worked on patient ability to keep hip in neutral position with tactile cuing on medial thigh to help initiate. Pt demonstrating ability to hold for approximate 30-60 secs x 5. Completed bent knee fall outs with patient demonstrating decreased control with hip abduction, but able to actively adduct to midline with min A from PT, completed 2 x 5 reps.       Knee/Hip Exercises: Aerobic   Other Aerobic  SciFit x 5 min level 1 with PT stabilizing at right knee to prevent ER of hip.               PT Short Term Goals - 12/02/19 1743      PT SHORT TERM GOAL #1   Title  Pt will be able to perform all transfers CGA for improved mobility.    Baseline  Pt is able to perform transfers CGA with sit to stand and stand/pivot    Time  4    Period  Weeks    Status  Achieved    Target Date  12/02/19      PT SHORT TERM GOAL #2   Title  Pt will be able to maintain standing with 1 UE support x 5 min to assist with standing ADLs supervision.    Baseline  Pt stood 5 min at walker supervision    Time  4    Period  Weeks    Status  Achieved    Target Date  12/02/19      PT SHORT TERM GOAL #3   Title  Pt will ambulate >350' with min assist of 1 person with RW with right hand grip attachment so that husband can safely ambulate with her at home.    Baseline  230' with RW mod assist.    Time  4    Period  Weeks    Status  Partially Met    Target Date  12/02/19        PT Long Term Goals - 10/28/19 2031      PT LONG TERM GOAL #1   Title  Pt will be able to perform progressive HEP for strengthening, balance and mobility with assist of husband to continued gains at home.  Time  8    Period  Weeks    Status  On-going    Target Date  12/30/19      PT LONG TERM GOAL #2   Title  Pt will decrease 5 x sit to stand from 27 sec to <22 sec from edge of mat for improved balance and functional mobility.     Time  8    Period  Weeks    Status  New    Target Date  12/30/19      PT LONG TERM GOAL #3   Title  Pt will ambulate >400' CGA with RW with right hand grip attachment for improved gait ability in home.    Time  8    Period  Weeks    Status  New    Target Date  12/30/19            Plan - 12/11/19 1616    Clinical Impression Statement  Continued NMR with focus on improved muscular recruitment and strength in RLE. Completed gait training with RW and w/o AD. Patient still needs supervision/CGA for safety, especially regarding removing RUE from hand grip. Overall patient contiues to demonstrate improves, and will continue to benefit from skilled PT services to address all unment STG/LTGs.    Personal Factors and Comorbidities  Comorbidity 3+    Comorbidities  anxiety, migraines, HTN    Examination-Activity Limitations  Bathing;Bed Mobility;Stand;Locomotion Level;Squat;Stairs;Dressing;Transfers    Examination-Participation Restrictions  Community Activity;Driving;Shop;Laundry;Meal Prep    Stability/Clinical Decision Making  Evolving/Moderate complexity    Rehab Potential  Fair    PT Frequency  2x / week    PT Duration  8 weeks    PT Treatment/Interventions  ADLs/Self Care Home Management;Electrical Stimulation;DME Instruction;Gait training;Stair training;Functional mobility training;Therapeutic activities;Therapeutic exercise;Orthotic Fit/Training;Patient/family education;Neuromuscular re-education;Balance training;Manual techniques;Passive range of motion;Vestibular;Wheelchair mobility training    PT Next Visit Plan  [place] to front of right shoe to help slide. Continue with activities in standing to facilitate increased right weight shift and R hip flexion/initiation of R swing phase, continue gait training with RW with right hand grip attachment.  Pt responded well to short bout on SciFit and may benefit for use to help wtih RLE strength, NMR.    Consulted and Agree with Plan of Care   Patient    Family Member Consulted  Caregiver       Patient will benefit from skilled therapeutic intervention in order to improve the following deficits and impairments:  Abnormal gait, Decreased activity tolerance, Decreased balance, Decreased cognition, Decreased knowledge of use of DME, Decreased mobility, Decreased range of motion, Decreased safety awareness, Decreased strength, Impaired sensation  Visit Diagnosis: Other abnormalities of gait and mobility  Muscle weakness (generalized)  Hemiplegia and hemiparesis following nontraumatic intracerebral hemorrhage affecting right dominant side (HCC)  Other disturbances of skin sensation     Problem List Patient Active Problem List   Diagnosis Date Noted  . Nontraumatic subcortical hemorrhage of left cerebral hemisphere ([place]) 08/27/2019  . Acute blood loss anemia   . Hypoalbuminemia due to protein-calorie malnutrition ([place])   . Thrombocytopenia ([place])   . Dysphagia 07/29/2019  . Infarction of left basal ganglia ([place]) 07/18/2019  . Hypernatremia   . Leukocytosis   . Essential hypertension   . Global aphasia   . Cytotoxic brain edema ([place]) 07/10/2019  . ICH (intracerebral hemorrhage) ([place]) 07/09/2019  . Anxiety state 02/21/2015  . Cephalalgia 12/21/2014    Jones Bales, PT, DPT 12/11/2019, 4:19 PM  [place]  [facility] [address] [place], [place], [address] Phone: [phone]   Fax:  [phone]  Name: Melanie Madden MRN: [id number] Date of Birth: 05/03/1966

## 2019-12-16 ENCOUNTER — Other Ambulatory Visit: Payer: Self-pay

## 2019-12-16 ENCOUNTER — Ambulatory Visit: Payer: Managed Care, Other (non HMO)

## 2019-12-16 ENCOUNTER — Encounter: Payer: Self-pay | Admitting: Occupational Therapy

## 2019-12-16 ENCOUNTER — Ambulatory Visit: Payer: Managed Care, Other (non HMO) | Admitting: Occupational Therapy

## 2019-12-16 DIAGNOSIS — R2689 Other abnormalities of gait and mobility: Secondary | ICD-10-CM

## 2019-12-16 DIAGNOSIS — I69219 Unspecified symptoms and signs involving cognitive functions following other nontraumatic intracranial hemorrhage: Secondary | ICD-10-CM

## 2019-12-16 DIAGNOSIS — R208 Other disturbances of skin sensation: Secondary | ICD-10-CM

## 2019-12-16 DIAGNOSIS — M6281 Muscle weakness (generalized): Secondary | ICD-10-CM

## 2019-12-16 DIAGNOSIS — I69151 Hemiplegia and hemiparesis following nontraumatic intracerebral hemorrhage affecting right dominant side: Secondary | ICD-10-CM

## 2019-12-16 NOTE — Therapy (Signed)
[facility] [address] [place], [place], [address] Phone: [phone]   Fax:  [phone]  Occupational Therapy Treatment  Patient Details  Name: Melanie Madden MRN: [id number] Date of Birth: 1966/04/01 Referring Provider (OT): Dr. Alysia Penna   Encounter Date: 12/16/2019  OT End of Session - 12/16/19 1454    Visit Number  25    Number of Visits  40   24+16=40   Date for OT Re-Evaluation  01/27/20    Authorization Type  [employer] 2021:  No prior auth OT, 60 DAY visit limit (regardless of how many therapies each day).    Authorization - Visit Number  30   combined   Authorization - Number of Visits  60    OT Start Time  1325    OT Stop Time  1407    OT Time Calculation (min)  42 min    Activity Tolerance  Patient tolerated treatment well    Behavior During Therapy  Restless;Impulsive       Past Medical History:  Diagnosis Date  . Allergy   . Anxiety   . Arthritis   . High triglycerides   . Kidney stones   . Migraines   . Recurrent sinus infections     Past Surgical History:  Procedure Laterality Date  . ANTERIOR CRUCIATE LIGAMENT REPAIR Left   . BUNIONECTOMY Right   . LASIK    . LITHOTRIPSY    . MENISCUS REPAIR Left   . NASAL SEPTUM SURGERY    . SHOULDER SURGERY    . VARICOSE VEIN SURGERY Left     There were no vitals filed for this visit.  Subjective Assessment - 12/16/19 1454    Subjective   pt denies pain    Pertinent History  Subcortical hemorrhage of R cerbral hemisphere.  PMH:  HTN, anxiety, migraines, hx of shoulder injection approx 2 weeks ago for R shoulder pain, hx of R shoulder surgery (arthroscopic)    Limitations  aphasia, fall risk, impulsive (husband reports hx of OCD)    Patient Stated Goals  husband reports: to be able to care for herself more (pt indicates that she onces to return to previous activities per questioning/gestures)    Currently in Pain?  No/denies          Standing, wt. Bearing with bilateral hands on table and then with RUE table slides forward and in slight abduction as able with mod-max A/cues for midline alignment/balance and to block R knee.  Pt with incr activation RUE in standing.  Sitting, Light wt. Bearing/AAROM shoulder flex/elbow ext with tilted stool with min-mod facilitation today  AAROM shoulder flex horizontal abduction/adduction with skate on table with min-mod facilitation/cueing then in shoulder flex/elbow ext to with min facilitation/cues.  Sitting, wt. Bearing on bilateral elbows on table with forward wt. Shifts with for incr scapular depression/stabilization with min acilitation/cueing.  Sitting, Therapist assisted low-range supported reach AAROM to grasp/release bottle with mod facilitation/cues (proximally).    AAROM thumb adduction/abduction on table with card with min-mod facilitation and inconsistent ability.  Attempts to pinch 1-inch block between thumb/2nd digit with mod facilitation and inconsistent ability.       OT Short Term Goals - 11/27/19 1216      OT SHORT TERM GOAL #1   Title  Pt/husband will be independent with initial HEP for RUE ROM--check STGs 10/14/19    Time  6    Period  Weeks    Status  Achieved  OT SHORT TERM GOAL #2   Title  Pt will perform UB dressing with min A.    Time  6    Period  Weeks    Status  Achieved   MOD ASSIST.  11/11/19:  pt able to don/doff zip-up fleece mod I.  11/17/19:  met per husband report     OT SHORT TERM GOAL #3   Title  Pt will perform UB bathing with mod A.    Time  6    Period  Weeks    Status  Achieved      OT SHORT TERM GOAL #4   Title  Pt will be able to brush hair with set-up.    Time  4    Period  Weeks    Status  On-going   fluctuating assist needed   Target Date  12/27/19      OT SHORT TERM GOAL #5   Title  Pt will demo at least 110* shoulder flex PROM in supine without pain for incr ease for ADLs.    Time  6    Period  Weeks     Status  Achieved      OT SHORT TERM GOAL #6   Title  Pt will be able to stabilze light object in [place] for low level bilateral reach.--check updated STGs 12/27/19    Time  4    Period  Weeks    Status  New    Target Date  12/27/19      OT SHORT TERM GOAL #7   Title  Pt will be able to use RUE as a stablizer consistently for selected tasks mod I.    Time  4    Period  Weeks    Status  New    Target Date  12/27/19        OT Long Term Goals - 11/27/19 1205      OT LONG TERM GOAL #1   Title  Pt/husband will be independent with HEP for functional activities for incr participation in IADLs/leisure tasks (and incorporating RUE if able).--check updated LTGs 01/27/20    Time  8    Period  Weeks    Status  On-going      OT LONG TERM GOAL #2   Title  Pt will perform UB dressing with supervision.    Time  12    Period  Weeks    Status  Achieved   11/24/19     OT LONG TERM GOAL #3   Title  Pt will perform LB dressing with mod A.    Time  12    Period  Weeks    Status  Achieved   11/17/19:  min A per husband report     OT LONG TERM GOAL #4   Title  Pt will perform UB bathing with min A.    Time  12    Period  Weeks    Status  Achieved   11/17/19:  met per husband report     OT LONG TERM GOAL #5   Title  Pt will perform simple cold snack prep  with min A/set up.    Time  8    Period  Weeks    Status  Revised   11/24/19:  not performing, not using w/c in the home anymore     OT LONG TERM GOAL #6   Title  Pt will perform toileting with mod A.    Time  12  Period  Weeks    Status  Achieved   11/24/19:  CGA/close supervision for ambulation/standing per aide report     OT LONG TERM GOAL #7   Title  Pt will demo at least 20* active shoulder flex in prep for functional low-level reach.    Time  8    Period  Weeks    Status  New      OT LONG TERM GOAL #8   Title  Pt will be able to weight bear through RUE in standing at counter to assist with ADL/IADL tasks.    Time  8     Period  Weeks    Status  New            Plan - 12/16/19 1456    Clinical Impression Statement  Pt with incr RUE activitation with light wt. bearing today and AAROM at shoulder/elbow and inconsistent thumb AAROM.  Pt slowly progressing towards goals.    Occupational performance deficits (Please refer to evaluation for details):  ADL's;IADL's;Leisure;Work;Social Participation    Body Structure / Function / Physical Skills  ADL;ROM;IADL;Sensation;Mobility;Strength;Tone;UE functional use;Decreased knowledge of precautions;Decreased knowledge of use of DME;Coordination;Balance;FMC;GMC    Cognitive Skills  Attention;Temperament/Personality;Understand;Thought;Safety Awareness    Rehab Potential  Good    Comorbidities Affecting Occupational Performance:  May have comorbidities impacting occupational performance    OT Frequency  2x / week    OT Duration  8 weeks   may be modified due to scheduling needs   OT Treatment/Interventions  Self-care/ADL training;Moist Heat;DME and/or AE instruction;Splinting;Therapeutic activities;Aquatic Therapy;Cognitive remediation/compensation;Therapeutic exercise;Cryotherapy;Neuromuscular education;Functional Mobility Training;Passive range of motion;Visual/perceptual remediation/compensation;Patient/family education;Manual Therapy;Electrical Stimulation;Fluidtherapy    Plan  continue with closed-chain activities, neuro-re ed, wt. bearing    Consulted and Agree with Plan of Care  Patient       Patient will benefit from skilled therapeutic intervention in order to improve the following deficits and impairments:   Body Structure / Function / Physical Skills: ADL, ROM, IADL, Sensation, Mobility, Strength, Tone, UE functional use, Decreased knowledge of precautions, Decreased knowledge of use of DME, Coordination, Balance, FMC, GMC Cognitive Skills: Attention, Temperament/Personality, Understand, Thought, Safety Awareness     Visit Diagnosis: Hemiplegia and  hemiparesis following nontraumatic intracerebral hemorrhage affecting right dominant side (HCC)  Muscle weakness (generalized)  Other abnormalities of gait and mobility  Other disturbances of skin sensation  Unspecified symptoms and signs involving cognitive functions following other nontraumatic intracranial hemorrhage    Problem List Patient Active Problem List   Diagnosis Date Noted  . Nontraumatic subcortical hemorrhage of left cerebral hemisphere ([place]) 08/27/2019  . Acute blood loss anemia   . Hypoalbuminemia due to protein-calorie malnutrition ([place])   . Thrombocytopenia ([place])   . Dysphagia 07/29/2019  . Infarction of left basal ganglia ([place]) 07/18/2019  . Hypernatremia   . Leukocytosis   . Essential hypertension   . Global aphasia   . Cytotoxic brain edema ([place]) 07/10/2019  . ICH (intracerebral hemorrhage) ([place]) 07/09/2019  . Anxiety state 02/21/2015  . Cephalalgia 12/21/2014    [facility] 12/16/2019, 2:57 PM  [facility] [address] [place], [place], [address] Phone: [phone]   Fax:  [phone]  Name: Melanie Madden MRN: [id number] Date of Birth: [birth year]   Vianne Bulls, OTR/L [facility] [address] [place], [place]  [address] [phone] phone [phone] 12/16/19 2:58 PM

## 2019-12-17 NOTE — Therapy (Signed)
[facility] [address] [place], [place], [address] Phone: [phone]   Fax:  [phone]  Physical Therapy Treatment  Patient Details  Name: Melanie Madden MRN: [id number] Date of Birth: [birth year] Referring Provider (PT): Lauraine Rinne, [place]   Encounter Date: 12/16/2019  PT End of Session - 12/16/19 1237    Visit Number  27    Number of Visits  35    Date for PT Re-Evaluation  12/30/19    Authorization Type  [employer]- 60 VL combined PT/OT/ST - counts as 1 visit if seen on same day.  Not sure if 60 is hard max.  $50 copay each day - make sure PT/OT/ST on same day if possible    Authorization - Visit Number  --   PT/OT/ST days combined   Authorization - Number of Visits  60    PT Start Time  1235    PT Stop Time  1320    PT Time Calculation (min)  45 min    Equipment Utilized During Treatment  Gait belt   pt's R AFO; dance shoe cover to aid in foot clearance   Activity Tolerance  Patient tolerated treatment well    Behavior During Therapy  Impulsive;Agitated       Past Medical History:  Diagnosis Date  . Allergy   . Anxiety   . Arthritis   . High triglycerides   . Kidney stones   . Migraines   . Recurrent sinus infections     Past Surgical History:  Procedure Laterality Date  . ANTERIOR CRUCIATE LIGAMENT REPAIR Left   . BUNIONECTOMY Right   . LASIK    . LITHOTRIPSY    . MENISCUS REPAIR Left   . NASAL SEPTUM SURGERY    . SHOULDER SURGERY    . VARICOSE VEIN SURGERY Left     There were no vitals filed for this visit.  Subjective Assessment - 12/16/19 1238    Subjective  Pt denies any new concerns. Denies using walker at home. Aide reports she uses hemiwalker and it makes aide very nervous as she almost fell.    Patient is accompained by:  --   Caregivers   Pertinent History  anxiety, migraines, HTN    Patient Stated Goals  Per husband report they would like her to get stronger on right, improve  mobility, and work on speech.    Currently in Pain?  No/denies                        [facility] Adult PT Treatment/Exercise - 12/16/19 1239      Ambulation/Gait   Ambulation/Gait  Yes    Ambulation/Gait Assistance  2: Max assist;3: Mod assist    Ambulation/Gait Assistance Details  PT trialed [facility] with patient as she was getting agitated when walker brought out. PT held right hand to provide support to left arm. Max assist with arm around pt who leaned significantly to left to clear right leg with step-to pattern. Had rehab tech assist 2nd half with holding left hand for shoulder support so PT could help more at trunk and pelvis to try to facilitate more right hip flexion and less trunk lean to the left. Also placed mirror in front of patient for visual cues. Pt was instructed to step past right foot with left foot but unable to. Switched to [place] for second bout and pt was able to step past right foot with left foot with more upright  posture. PT assisted mod assist to help facilitate right hip flexion at hip to help with right foot clearance and prevent from bumping walker back leg. PT asked patient after which device felt better and she pointed to walker but still not happy about using. PT explained that the walker allowed her to try to decrease her compensatory strategy of trunk lateral flexion to advance leg and tried to get her to activiate right leg muscles more. Aide was observing throughout. She voiced concerns with pt using hemiwalker at home and pt started to get agitated. PT did advise that walker was safest option but still needed mod assist to perform and could not get up by herself.    Ambulation Distance (Feet)  115 Feet   200' with RW   Assistive device  Straight cane;Rolling walker   right hand grip attachment   Gait Pattern  Step-to pattern;Step-through pattern;Decreased stance time - right;Decreased hip/knee flexion - right    Pre-Gait Activities  Standing at walker with  right hand grip attachment: weight shifting to right with PT assisting to stabilize at right knee and tactile cues at pelvis to shift over x 6, switched to removing walker and having PT sit in front of patient. Pt touched therapists shoulder with left hand and PT supported right hand while assisting to weight shift patient over right leg x 10. Verbal cues to try to tighten her bottom and knee. Sit to stand from mat x 6 with PT in same position assisting to weight shift over right leg more.             PT Education - 12/17/19 0605    Education Details  PT again educated on importance of having assistance whenever up and strongly encouraged walker use.    Person(s) Educated  Patient;Caregiver(s)    Methods  Explanation    Comprehension  Verbalized understanding       PT Short Term Goals - 12/02/19 1743      PT SHORT TERM GOAL #1   Title  Pt will be able to perform all transfers CGA for improved mobility.    Baseline  Pt is able to perform transfers CGA with sit to stand and stand/pivot    Time  4    Period  Weeks    Status  Achieved    Target Date  12/02/19      PT SHORT TERM GOAL #2   Title  Pt will be able to maintain standing with 1 UE support x 5 min to assist with standing ADLs supervision.    Baseline  Pt stood 5 min at walker supervision    Time  4    Period  Weeks    Status  Achieved    Target Date  12/02/19      PT SHORT TERM GOAL #3   Title  Pt will ambulate >350' with min assist of 1 person with RW with right hand grip attachment so that husband can safely ambulate with her at home.    Baseline  230' with RW mod assist.    Time  4    Period  Weeks    Status  Partially Met    Target Date  12/02/19        PT Long Term Goals - 10/28/19 2031      PT LONG TERM GOAL #1   Title  Pt will be able to perform progressive HEP for strengthening, balance and mobility with assist of husband to continued gains  at home.    Time  8    Period  Weeks    Status  On-going     Target Date  12/30/19      PT LONG TERM GOAL #2   Title  Pt will decrease 5 x sit to stand from 27 sec to <22 sec from edge of mat for improved balance and functional mobility.    Time  8    Period  Weeks    Status  New    Target Date  12/30/19      PT LONG TERM GOAL #3   Title  Pt will ambulate >400' CGA with RW with right hand grip attachment for improved gait ability in home.    Time  8    Period  Weeks    Status  New    Target Date  12/30/19            Plan - 12/17/19 0607    Clinical Impression Statement  Pt was more agitated with working on RW to begin today. PT trial SPC but patient with huge lateral trunk lean to left to allow her to clear right leg and shortened left step. Ambulated with RW 2nd bout and much improvement in gait quality. Pt wa able to acknowledge that walker was easier but not happy about using.    Personal Factors and Comorbidities  Comorbidity 3+    Comorbidities  anxiety, migraines, HTN    Examination-Activity Limitations  Bathing;Bed Mobility;Stand;Locomotion Level;Squat;Stairs;Dressing;Transfers    Examination-Participation Restrictions  Community Activity;Driving;Shop;Laundry;Meal Prep    Stability/Clinical Decision Making  Evolving/Moderate complexity    Rehab Potential  Fair    PT Frequency  2x / week    PT Duration  8 weeks    PT Treatment/Interventions  ADLs/Self Care Home Management;Electrical Stimulation;DME Instruction;Gait training;Stair training;Functional mobility training;Therapeutic activities;Therapeutic exercise;Orthotic Fit/Training;Patient/family education;Neuromuscular re-education;Balance training;Manual techniques;Passive range of motion;Vestibular;Wheelchair mobility training    PT Next Visit Plan  Next week is week 7 or 8 so update LTG dates. Do we need to schedule more visits out. [place] to front of right shoe to help slide. Continue with activities in standing to facilitate increased right weight shift and R hip  flexion/initiation of R swing phase, continue gait training with RW with right hand grip attachment.  Pt responded well to short bout on SciFit and may benefit for use to help wtih RLE strength, NMR.    Consulted and Agree with Plan of Care  Patient    Family Member Consulted  Caregiver       Patient will benefit from skilled therapeutic intervention in order to improve the following deficits and impairments:  Abnormal gait, Decreased activity tolerance, Decreased balance, Decreased cognition, Decreased knowledge of use of DME, Decreased mobility, Decreased range of motion, Decreased safety awareness, Decreased strength, Impaired sensation  Visit Diagnosis: Other abnormalities of gait and mobility  Muscle weakness (generalized)     Problem List Patient Active Problem List   Diagnosis Date Noted  . Nontraumatic subcortical hemorrhage of left cerebral hemisphere ([place]) 08/27/2019  . Acute blood loss anemia   . Hypoalbuminemia due to protein-calorie malnutrition ([place])   . Thrombocytopenia ([place])   . Dysphagia 07/29/2019  . Infarction of left basal ganglia ([place]) 07/18/2019  . Hypernatremia   . Leukocytosis   . Essential hypertension   . Global aphasia   . Cytotoxic brain edema ([place]) 07/10/2019  . ICH (intracerebral hemorrhage) ([place]) 07/09/2019  . Anxiety state 02/21/2015  . Cephalalgia 12/21/2014  Electa Sniff, PT, DPT, NCS 12/17/2019, 6:13 AM  [facility] [address] [place], [place], [address] Phone: [phone]   Fax:  [phone]  Name: Daryl Quiros MRN: [id number] Date of Birth: August 19, 1965

## 2019-12-18 ENCOUNTER — Ambulatory Visit: Payer: Managed Care, Other (non HMO) | Admitting: Occupational Therapy

## 2019-12-18 ENCOUNTER — Ambulatory Visit: Payer: Managed Care, Other (non HMO) | Admitting: Rehabilitative and Restorative Service Providers"

## 2019-12-18 ENCOUNTER — Encounter: Payer: Self-pay | Admitting: Rehabilitative and Restorative Service Providers"

## 2019-12-18 ENCOUNTER — Other Ambulatory Visit: Payer: Self-pay

## 2019-12-18 DIAGNOSIS — I69219 Unspecified symptoms and signs involving cognitive functions following other nontraumatic intracranial hemorrhage: Secondary | ICD-10-CM

## 2019-12-18 DIAGNOSIS — M6281 Muscle weakness (generalized): Secondary | ICD-10-CM

## 2019-12-18 DIAGNOSIS — R2689 Other abnormalities of gait and mobility: Secondary | ICD-10-CM | POA: Diagnosis not present

## 2019-12-18 DIAGNOSIS — R208 Other disturbances of skin sensation: Secondary | ICD-10-CM

## 2019-12-18 DIAGNOSIS — I69151 Hemiplegia and hemiparesis following nontraumatic intracerebral hemorrhage affecting right dominant side: Secondary | ICD-10-CM

## 2019-12-18 NOTE — Therapy (Signed)
[facility] [address] [place], [place], [address] Phone: [phone]   Fax:  [phone]  Occupational Therapy Treatment  Patient Details  Name: Melanie Madden MRN: [id number] Date of Birth: 1966-07-21 Referring Provider (OT): Dr. Alysia Penna   Encounter Date: 12/18/2019  OT End of Session - 12/18/19 1103    Visit Number  26    Number of Visits  40   24+16=40   Date for OT Re-Evaluation  01/27/20    Authorization Type  [employer] 2021:  No prior auth OT, 60 DAY visit limit (regardless of how many therapies each day).    Authorization - Visit Number  31   combined   Authorization - Number of Visits  21    OT Start Time  1104   pt was upset, angry, tearful initially   OT Stop Time  1145    OT Time Calculation (min)  41 min    Activity Tolerance  Patient tolerated treatment well    Behavior During Therapy  Restless;Impulsive       Past Medical History:  Diagnosis Date  . Allergy   . Anxiety   . Arthritis   . High triglycerides   . Kidney stones   . Migraines   . Recurrent sinus infections     Past Surgical History:  Procedure Laterality Date  . ANTERIOR CRUCIATE LIGAMENT REPAIR Left   . BUNIONECTOMY Right   . LASIK    . LITHOTRIPSY    . MENISCUS REPAIR Left   . NASAL SEPTUM SURGERY    . SHOULDER SURGERY    . VARICOSE VEIN SURGERY Left     There were no vitals filed for this visit.  Subjective Assessment - 12/18/19 1156    Subjective   pt denies pain; however, was upset when arrived    Pertinent History  Subcortical hemorrhage of R cerbral hemisphere.  PMH:  HTN, anxiety, migraines, hx of shoulder injection approx 2 weeks ago for R shoulder pain, hx of R shoulder surgery (arthroscopic)    Limitations  aphasia, fall risk, impulsive (husband reports hx of OCD)    Patient Stated Goals  husband reports: to be able to care for herself more (pt indicates that she onces to return to previous activities per  questioning/gestures)    Currently in Pain?  No/denies        Pt arrived and was upset during first half of session (initally angry and then tearful).  Husband initially reported that she was having a bad day and stepped away.  Pt taken to private room for treatment.  Allowed pt to attempt to express emotions and frustration.  Pt indicates that she is upset with aide and argued with husband.   OT expressed sympathy and offered pt tissues and water.    OT then spoke to husband separately (while getting pt water) and he reported that the aide called him saying that she couldn't stay due to pt yelling at her and so husband returned home and brought pt to therapy.  Recommended that pt discuss behavior/emotional changes from CVA with MD and discuss neuro psychology (although ? Benefit due to severity of aphasia).  Husband reports that he has previously discussed this but may discuss again.  Asked pt if she wanted to participate in therapy.  Pt gestured that she was ready to participate in therapy.  Gave pt possible treatment options and pt chose NMES.    NMES x87mn to R wrist/finger extensors 50pps, 250 pulse  width, intensity=30 with no adverse reactions (mild pink skin discoloration at electrode site) while attempting to grasp/release soft ball (semi-deflated) timed with on/off cycles with therapist facilitation of grasp and while performing shoulder flex/elbow ext table slides to target with attempts to grasp/release ball (timed with NMES) with therapist providing min facilitation for table slide/reach and facilitation for grasp.  Pt was not tearful by end of session and expressed appreciation and that she was ready for PT.  (communicated this with husband who was in the lobby while pt began PT session).     OT Short Term Goals - 11/27/19 1216      OT SHORT TERM GOAL #1   Title  Pt/husband will be independent with initial HEP for RUE ROM--check STGs 10/14/19    Time  6    Period  Weeks    Status   Achieved      OT SHORT TERM GOAL #2   Title  Pt will perform UB dressing with min A.    Time  6    Period  Weeks    Status  Achieved   MOD ASSIST.  11/11/19:  pt able to don/doff zip-up fleece mod I.  11/17/19:  met per husband report     OT SHORT TERM GOAL #3   Title  Pt will perform UB bathing with mod A.    Time  6    Period  Weeks    Status  Achieved      OT SHORT TERM GOAL #4   Title  Pt will be able to brush hair with set-up.    Time  4    Period  Weeks    Status  On-going   fluctuating assist needed   Target Date  12/27/19      OT SHORT TERM GOAL #5   Title  Pt will demo at least 110* shoulder flex PROM in supine without pain for incr ease for ADLs.    Time  6    Period  Weeks    Status  Achieved      OT SHORT TERM GOAL #6   Title  Pt will be able to stabilze light object in [place] for low level bilateral reach.--check updated STGs 12/27/19    Time  4    Period  Weeks    Status  New    Target Date  12/27/19      OT SHORT TERM GOAL #7   Title  Pt will be able to use RUE as a stablizer consistently for selected tasks mod I.    Time  4    Period  Weeks    Status  New    Target Date  12/27/19        OT Long Term Goals - 11/27/19 1205      OT LONG TERM GOAL #1   Title  Pt/husband will be independent with HEP for functional activities for incr participation in IADLs/leisure tasks (and incorporating RUE if able).--check updated LTGs 01/27/20    Time  8    Period  Weeks    Status  On-going      OT LONG TERM GOAL #2   Title  Pt will perform UB dressing with supervision.    Time  12    Period  Weeks    Status  Achieved   11/24/19     OT LONG TERM GOAL #3   Title  Pt will perform LB dressing with mod A.    Time  12    Period  Weeks    Status  Achieved   11/17/19:  min A per husband report     OT LONG TERM GOAL #4   Title  Pt will perform UB bathing with min A.    Time  12    Period  Weeks    Status  Achieved   11/17/19:  met per husband report     OT  LONG TERM GOAL #5   Title  Pt will perform simple cold snack prep  with min A/set up.    Time  8    Period  Weeks    Status  Revised   11/24/19:  not performing, not using w/c in the home anymore     OT LONG TERM GOAL #6   Title  Pt will perform toileting with mod A.    Time  12    Period  Weeks    Status  Achieved   11/24/19:  CGA/close supervision for ambulation/standing per aide report     OT LONG TERM GOAL #7   Title  Pt will demo at least 20* active shoulder flex in prep for functional low-level reach.    Time  8    Period  Weeks    Status  New      OT LONG TERM GOAL #8   Title  Pt will be able to weight bear through RUE in standing at counter to assist with ADL/IADL tasks.    Time  8    Period  Weeks    Status  New            Plan - 12/18/19 1103    Clinical Impression Statement  Pt arrived and was upset, which affected ability to participate initially during session.  Pt was able to participate during second half of session and responded well.    Occupational performance deficits (Please refer to evaluation for details):  ADL's;IADL's;Leisure;Work;Social Participation    Body Structure / Function / Physical Skills  ADL;ROM;IADL;Sensation;Mobility;Strength;Tone;UE functional use;Decreased knowledge of precautions;Decreased knowledge of use of DME;Coordination;Balance;FMC;GMC    Cognitive Skills  Attention;Temperament/Personality;Understand;Thought;Safety Awareness    Rehab Potential  Good    Comorbidities Affecting Occupational Performance:  May have comorbidities impacting occupational performance    OT Frequency  2x / week    OT Duration  8 weeks   may be modified due to scheduling needs   OT Treatment/Interventions  Self-care/ADL training;Moist Heat;DME and/or AE instruction;Splinting;Therapeutic activities;Aquatic Therapy;Cognitive remediation/compensation;Therapeutic exercise;Cryotherapy;Neuromuscular education;Functional Mobility Training;Passive range of  motion;Visual/perceptual remediation/compensation;Patient/family education;Manual Therapy;Electrical Stimulation;Fluidtherapy    Plan  continue with closed-chain activities, neuro-re ed, wt. bearing    Consulted and Agree with Plan of Care  Patient;Family member/caregiver       Patient will benefit from skilled therapeutic intervention in order to improve the following deficits and impairments:   Body Structure / Function / Physical Skills: ADL, ROM, IADL, Sensation, Mobility, Strength, Tone, UE functional use, Decreased knowledge of precautions, Decreased knowledge of use of DME, Coordination, Balance, FMC, GMC Cognitive Skills: Attention, Temperament/Personality, Understand, Thought, Safety Awareness     Visit Diagnosis: Hemiplegia and hemiparesis following nontraumatic intracerebral hemorrhage affecting right dominant side (HCC)  Muscle weakness (generalized)  Other abnormalities of gait and mobility  Other disturbances of skin sensation  Unspecified symptoms and signs involving cognitive functions following other nontraumatic intracranial hemorrhage    Problem List Patient Active Problem List   Diagnosis Date Noted  . Nontraumatic subcortical hemorrhage of left cerebral hemisphere ([place]) 08/27/2019  .  Acute blood loss anemia   . Hypoalbuminemia due to protein-calorie malnutrition ([place])   . Thrombocytopenia ([place])   . Dysphagia 07/29/2019  . Infarction of left basal ganglia ([place]) 07/18/2019  . Hypernatremia   . Leukocytosis   . Essential hypertension   . Global aphasia   . Cytotoxic brain edema ([place]) 07/10/2019  . ICH (intracerebral hemorrhage) ([place]) 07/09/2019  . Anxiety state 02/21/2015  . Cephalalgia 12/21/2014    [facility] 12/18/2019, 12:00 PM  [facility] [address] [place], [place], [address] Phone: [phone]   Fax:  [phone]  Name: Melanie Madden MRN: [id number] Date of Birth: [birth year]    Vianne Bulls, OTR/L [facility] [address] [place], [place]  [address] [phone] phone [phone] 12/18/19 12:00 PM

## 2019-12-18 NOTE — Therapy (Signed)
[facility] [address] [place], [place], [address] Phone: [phone]   Fax:  [phone]  Physical Therapy Treatment  Patient Details  Name: Melanie Madden MRN: [id number] Date of Birth: 01-29-1966 Referring Provider (PT): Lauraine Rinne, [place]   Encounter Date: 12/18/2019  PT End of Session - 12/18/19 1345    Visit Number  28    Number of Visits  35    Date for PT Re-Evaluation  12/30/19    Authorization Type  [employer]- 60 VL combined PT/OT/ST - counts as 1 visit if seen on same day.  Not sure if 60 is hard max.  $50 copay each day - make sure PT/OT/ST on same day if possible    Authorization - Visit Number  --   PT/OT/ST days combined   Authorization - Number of Visits  60    PT Start Time  1147    PT Stop Time  1227    PT Time Calculation (min)  40 min    Equipment Utilized During Treatment  Gait belt   pt's R AFO; dance shoe cover to aid in foot clearance   Activity Tolerance  Patient tolerated treatment well    Behavior During Therapy  Impulsive;Agitated       Past Medical History:  Diagnosis Date  . Allergy   . Anxiety   . Arthritis   . High triglycerides   . Kidney stones   . Migraines   . Recurrent sinus infections     Past Surgical History:  Procedure Laterality Date  . ANTERIOR CRUCIATE LIGAMENT REPAIR Left   . BUNIONECTOMY Right   . LASIK    . LITHOTRIPSY    . MENISCUS REPAIR Left   . NASAL SEPTUM SURGERY    . SHOULDER SURGERY    . VARICOSE VEIN SURGERY Left     There were no vitals filed for this visit.  Subjective Assessment - 12/18/19 1142    Subjective  Patient denies any new concerns. Using walker at home. Aide is not present for today's appointment.    Patient is accompained by:  --   Caregivers   Pertinent History  anxiety, migraines, HTN    Patient Stated Goals  Per husband report they would like her to get stronger on right, improve mobility, and work on speech.    Currently in  Pain?  No/denies                        [facility] Adult PT Treatment/Exercise - 12/18/19 1342      Ambulation/Gait   Ambulation/Gait  Yes    Ambulation/Gait Assistance  2: Max assist;3: Mod assist    Ambulation/Gait Assistance Details  With RW and R hand grip with max cueing from PT at pelvis to foster R weight shift prior to advancement of L foot. Initiated gait in parallel bars, then progressed outside bars with RW + hand grip. When in // bars, PT tech providing support to R arm/shoulder while PT fostered weight shifting from pelvis (standing behind the patient). Once ambulating with RW + R hand grip, requires mod-max assistance to advance RLE with hip/knee flexion and right lateral weight shift to offload LLE enough to advance limb at/beyond R foot. Benefitted from static fwd "stepping" described in NMR, below, with demo of much improved positioning and weight shifting.     Ambulation Distance (Feet)  15 Feet   15 ft and one lap in // bars  Assistive device  Rolling walker   right hand grip attachment   Gait Pattern  Step-to pattern;Step-through pattern;Decreased stance time - right;Decreased hip/knee flexion - right    Ambulation Surface  Level;Indoor    Pre-Gait Activities  in parallel bars, weight shifting with max cues from hips to minimize left lateral trunk/pelvic lean with physical therapy tech stabilizing R arm/shoulder to reduce pain in R shoulder and allow PT to foster proper weight shift from pelvis.       Neuro Re-ed    Neuro Re-ed Details   With mirror as visual feedback and circle target on floor, practiced L foot fwd stepping/foot tap then return to neutral with PT providing assistance as outlined next for improvement in bilateral weight bearing when in quiet stance between L foot steps/taps. Physical therapy tech providing adjustments prn to R hand on RW's grip in addition to stabilization at knee (anterior pressure) to prevent buckling. PT standing posterior to  patient providing near max cueing (which reduced to mod cueing by end of session) at pelvis fostering right weight shift prior to advancement of L foot. Noted marked improvement with static standing left fwd foot tapping compared to gait training with improvements in maintenance of right lower extremity weight bearing even between L foot steps/taps with PT required to slow patient multiple times to reset neutral position (emphasis at trunk/pelvis) between each step/L foot tap. Performed 2 sets of 10 L foot taps with multiple standing and seated rest breaks. Finished session with quiet stance (adjustments made as noted, above, to weight shift/positioning) with L hand resting by side x 20 seconds, 3 reps. Progressed to challenging her ability to maintain bilateral lower extremity weight bearing in neutral (minimized left lateral lean via PT providing same cues as outlined above) with L hand reaching in left hemisphere to find PT's hand. Required multiple resets to midline with near max cueing at pelvis from PT especially once fatigued.                PT Short Term Goals - 12/02/19 1743      PT SHORT TERM GOAL #1   Title  Pt will be able to perform all transfers CGA for improved mobility.    Baseline  Pt is able to perform transfers CGA with sit to stand and stand/pivot    Time  4    Period  Weeks    Status  Achieved    Target Date  12/02/19      PT SHORT TERM GOAL #2   Title  Pt will be able to maintain standing with 1 UE support x 5 min to assist with standing ADLs supervision.    Baseline  Pt stood 5 min at walker supervision    Time  4    Period  Weeks    Status  Achieved    Target Date  12/02/19      PT SHORT TERM GOAL #3   Title  Pt will ambulate >350' with min assist of 1 person with RW with right hand grip attachment so that husband can safely ambulate with her at home.    Baseline  230' with RW mod assist.    Time  4    Period  Weeks    Status  Partially Met    Target Date   12/02/19        PT Long Term Goals - 10/28/19 2031      PT LONG TERM GOAL #1   Title  Pt  will be able to perform progressive HEP for strengthening, balance and mobility with assist of husband to continued gains at home.    Time  8    Period  Weeks    Status  On-going    Target Date  12/30/19      PT LONG TERM GOAL #2   Title  Pt will decrease 5 x sit to stand from 27 sec to <22 sec from edge of mat for improved balance and functional mobility.    Time  8    Period  Weeks    Status  New    Target Date  12/30/19      PT LONG TERM GOAL #3   Title  Pt will ambulate >400' CGA with RW with right hand grip attachment for improved gait ability in home.    Time  8    Period  Weeks    Status  New    Target Date  12/30/19            Plan - 12/18/19 1346    Clinical Impression Statement  Today's skilled session focused on progression of gait training with heavy emphasis on static standing, finding midline position, and left foot step/taps while striving to maintain adequate right lower extremity weight bearing. Appeared to greatly benefit from visual feedback to date with improvement in willingness to shift right with static standing (+ left foot tap) versus gait where she appears to become distracted/more concerned with advancement forward rather than following cueing instruction for techinque. She wil benefit from continud PT working towards [place].    Personal Factors and Comorbidities  Comorbidity 3+    Comorbidities  anxiety, migraines, HTN    Examination-Activity Limitations  Bathing;Bed Mobility;Stand;Locomotion Level;Squat;Stairs;Dressing;Transfers    Examination-Participation Restrictions  Community Activity;Driving;Shop;Laundry;Meal Prep    Stability/Clinical Decision Making  Evolving/Moderate complexity    Rehab Potential  Fair    PT Frequency  2x / week    PT Duration  8 weeks    PT Treatment/Interventions  ADLs/Self Care Home Management;Electrical Stimulation;DME  Instruction;Gait training;Stair training;Functional mobility training;Therapeutic activities;Therapeutic exercise;Orthotic Fit/Training;Patient/family education;Neuromuscular re-education;Balance training;Manual techniques;Passive range of motion;Vestibular;Wheelchair mobility training    PT Next Visit Plan  Next week is week 7 or 8 so update LTG dates. [place] to front of right shoe to help slide. Continue with activities in standing to facilitate increased right weight shift and R hip flexion/initiation of R swing phase, continue gait training with RW with right hand grip attachment.  Pt responded well to short bout on SciFit and may benefit for use to help wtih RLE strength, NMR.    Consulted and Agree with Plan of Care  Patient    Family Member Consulted  Caregiver       Patient will benefit from skilled therapeutic intervention in order to improve the following deficits and impairments:  Abnormal gait, Decreased activity tolerance, Decreased balance, Decreased cognition, Decreased knowledge of use of DME, Decreased mobility, Decreased range of motion, Decreased safety awareness, Decreased strength, Impaired sensation  Visit Diagnosis: Hemiplegia and hemiparesis following nontraumatic intracerebral hemorrhage affecting right dominant side (HCC)  Muscle weakness (generalized)  Other abnormalities of gait and mobility     Problem List Patient Active Problem List   Diagnosis Date Noted  . Nontraumatic subcortical hemorrhage of left cerebral hemisphere ([place]) 08/27/2019  . Acute blood loss anemia   . Hypoalbuminemia due to protein-calorie malnutrition ([place])   . Thrombocytopenia ([place])   . Dysphagia 07/29/2019  . 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 Hypernatremia   . Leukocytosis   . Essential hypertension   . Global aphasia   . 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) 07/10/2019  . ICH (intracerebral hemorrhage) ([place]) 07/09/2019  . Anxiety state 02/21/2015  . Cephalalgia  12/21/2014    Juliann Pulse, PT, DPT  12/18/2019, 1:52 PM  [facility] [address] [place], [place], [address] Phone: [phone]   Fax:  [phone]  Name: Chistina Roston MRN: [id number] Date of Birth: June 02, 1966

## 2019-12-19 ENCOUNTER — Encounter (HOSPITAL_COMMUNITY): Payer: Self-pay

## 2019-12-19 ENCOUNTER — Emergency Department (HOSPITAL_COMMUNITY): Payer: Managed Care, Other (non HMO)

## 2019-12-19 ENCOUNTER — Emergency Department (HOSPITAL_COMMUNITY)
Admission: EM | Admit: 2019-12-19 | Discharge: 2019-12-20 | Disposition: A | Discharge status: Home / Self Care | Payer: Managed Care, Other (non HMO) | Attending: Emergency Medicine | Admitting: Emergency Medicine

## 2019-12-19 ENCOUNTER — Other Ambulatory Visit: Payer: Self-pay

## 2019-12-19 DIAGNOSIS — F4325 Adjustment disorder with mixed disturbance of emotions and conduct: Secondary | ICD-10-CM | POA: Diagnosis present

## 2019-12-19 DIAGNOSIS — Z20822 Contact with and (suspected) exposure to covid-19: Secondary | ICD-10-CM | POA: Insufficient documentation

## 2019-12-19 DIAGNOSIS — Z0471 Encounter for examination and observation following alleged adult physical abuse: Secondary | ICD-10-CM | POA: Diagnosis not present

## 2019-12-19 DIAGNOSIS — Z87891 Personal history of nicotine dependence: Secondary | ICD-10-CM | POA: Insufficient documentation

## 2019-12-19 DIAGNOSIS — I6782 Cerebral ischemia: Secondary | ICD-10-CM | POA: Insufficient documentation

## 2019-12-19 DIAGNOSIS — I69398 Other sequelae of cerebral infarction: Secondary | ICD-10-CM | POA: Insufficient documentation

## 2019-12-19 DIAGNOSIS — Z79899 Other long term (current) drug therapy: Secondary | ICD-10-CM | POA: Insufficient documentation

## 2019-12-19 DIAGNOSIS — M79601 Pain in right arm: Secondary | ICD-10-CM | POA: Diagnosis present

## 2019-12-19 DIAGNOSIS — F320 Major depressive disorder, single episode, mild: Secondary | ICD-10-CM | POA: Diagnosis not present

## 2019-12-19 DIAGNOSIS — F919 Conduct disorder, unspecified: Secondary | ICD-10-CM

## 2019-12-19 DIAGNOSIS — I10 Essential (primary) hypertension: Secondary | ICD-10-CM | POA: Insufficient documentation

## 2019-12-19 LAB — CBC
HCT: 44.3 % (ref 36.0–46.0)
Hemoglobin: 14.6 g/dL (ref 12.0–15.0)
MCH: 29.7 pg (ref 26.0–34.0)
MCHC: 33 g/dL (ref 30.0–36.0)
MCV: 90 fL (ref 80.0–100.0)
Platelets: 204 10*3/uL (ref 150–400)
RBC: 4.92 MIL/uL (ref 3.87–5.11)
RDW: 13.7 % (ref 11.5–15.5)
WBC: 6.6 10*3/uL (ref 4.0–10.5)
nRBC: 0 % (ref 0.0–0.2)

## 2019-12-19 LAB — COMPREHENSIVE METABOLIC PANEL
ALT: 32 U/L (ref 0–44)
AST: 21 U/L (ref 15–41)
Albumin: 3.6 g/dL (ref 3.5–5.0)
Alkaline Phosphatase: 58 U/L (ref 38–126)
Anion gap: 9 (ref 5–15)
BUN: 18 mg/dL (ref 6–20)
CO2: 28 mmol/L (ref 22–32)
Calcium: 10.1 mg/dL (ref 8.9–10.3)
Chloride: 103 mmol/L (ref 98–111)
Creatinine, Ser: 0.85 mg/dL (ref 0.44–1.00)
GFR calc Af Amer: >60 mL/min (ref >60)
GFR calc non Af Amer: >60 mL/min (ref >60)
Glucose, Bld: 116 mg/dL — ABNORMAL HIGH (ref 70–99)
Potassium: 4.1 mmol/L (ref 3.5–5.1)
Sodium: 140 mmol/L (ref 135–145)
Total Bilirubin: 0.5 mg/dL (ref 0.3–1.2)
Total Protein: 6.6 g/dL (ref 6.5–8.1)

## 2019-12-19 LAB — I-STAT BETA HCG BLOOD, ED (MC, WL, AP ONLY): I-stat hCG, quantitative: <5 m[IU]/mL (ref <5)

## 2019-12-19 LAB — SARS CORONAVIRUS 2 BY RT PCR (HOSPITAL ORDER, PERFORMED IN ~~LOC~~ HOSPITAL LAB): SARS Coronavirus 2: NEGATIVE

## 2019-12-19 MED ORDER — QUETIAPINE FUMARATE 25 MG PO TABS
25.0000 mg | ORAL_TABLET | Freq: Two times a day (BID) | ORAL | Status: DC
  Administered 2019-12-20: 25 mg via ORAL
  Filled 2019-12-19 (×2): qty 1

## 2019-12-19 MED ORDER — LORAZEPAM 1 MG PO TABS: 1.0000 mg | ORAL_TABLET | Freq: Three times a day (TID) | ORAL | Status: DC | PRN

## 2019-12-19 MED ORDER — LORAZEPAM 1 MG PO TABS
2.0000 mg | ORAL_TABLET | Freq: Once | ORAL | Status: AC
  Administered 2019-12-19: 2 mg via ORAL
  Filled 2019-12-19: qty 2

## 2019-12-19 MED ORDER — AMLODIPINE BESYLATE 5 MG PO TABS
10.0000 mg | ORAL_TABLET | Freq: Every day | ORAL | Status: DC
  Administered 2019-12-20: 10 mg via ORAL
  Filled 2019-12-19: qty 2

## 2019-12-19 MED ORDER — LORAZEPAM 1 MG PO TABS: 1.0000 mg | ORAL_TABLET | Freq: Once | ORAL | Status: DC

## 2019-12-19 MED ORDER — ATORVASTATIN CALCIUM 10 MG PO TABS: 20.0000 mg | ORAL_TABLET | Freq: Every day | ORAL | Status: DC
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
## 2019-12-19 NOTE — SANE Note (Signed)
On 12/19/2019, at approximately 1701 hours, I spoke with the ED Physician and advised that I would be in to see the patient in approximately 1 hour, as there is an outstanding CT to be performed.
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
## 2019-12-19 NOTE — ED Provider Notes (Signed)
Patient's labs are nonconcerning.  Her home meds were ordered.  She is awaiting reevaluation and social work evaluation in the morning.   Malvin Johns, MD 12/19/19 [phone]
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
## 2019-12-19 NOTE — ED Notes (Signed)
All belongings inventoried and secured in [place] [address]; no medications nor valuables were found. Pt was wanded by security. Wheelchair left with Mackey Birchwood, RN

## 2019-12-19 NOTE — ED Notes (Signed)
Spoke with husband [name] on phone- states that pt had an episode of throwing things and yelling, then developed chest pain after phone would not work and direct tv would not work. Husband said he took her out of wheelchair and sat her on the floor so she would not hurt herself by throwing herself out of the chair.

## 2019-12-20 DIAGNOSIS — F4325 Adjustment disorder with mixed disturbance of emotions and conduct: Secondary | ICD-10-CM | POA: Diagnosis present
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
## 2019-12-20 NOTE — ED Notes (Signed)
Pt feeding self breakfast after set up.

## 2019-12-20 NOTE — ED Notes (Signed)
Please [name] spouse @ [phone] a status update--Melanie Madden
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
## 2019-12-20 NOTE — ED Notes (Signed)
Called spouse back and advised pt left her sunglasses. Advised he will come pick them up later today from [employer].

## 2019-12-20 NOTE — ED Notes (Signed)
Spouse has arrived to transport pt home.

## 2019-12-20 NOTE — ED Notes (Signed)
Pt's spouse [name] [phone] - updated on tx plan - BH NP to assess pt this am. Spouse provided numbers to pt's caregiver - Donald Pore - [phone] and sister Magdalen Spatz [phone]. Spouse advised he will be able to come pick up pt if and when she is d/c'd.

## 2019-12-20 NOTE — Progress Notes (Signed)
Per Waylan Boga NP, pt is stable for discharge. CSW contacted pt's husband, Rodney Cruise [phone] to notify of disposition. He had no concerns regarding plan for discharge today and will pick her up within the hour. CSW notified Eber Hong RN of above.   Namine Beahm S. Ouida Sills, MSW, LCSW Clinical Social Worker 12/20/2019 10:34 AM

## 2019-12-21 ENCOUNTER — Ambulatory Visit: Payer: Managed Care, Other (non HMO) | Admitting: Occupational Therapy

## 2019-12-21 ENCOUNTER — Ambulatory Visit: Payer: Managed Care, Other (non HMO) | Admitting: Physical Therapy

## 2019-12-23 ENCOUNTER — Ambulatory Visit: Payer: Managed Care, Other (non HMO)

## 2019-12-23 ENCOUNTER — Encounter: Payer: Managed Care, Other (non HMO) | Admitting: Speech Pathology

## 2019-12-23 ENCOUNTER — Other Ambulatory Visit: Payer: Self-pay

## 2019-12-23 ENCOUNTER — Encounter: Payer: Self-pay | Admitting: Speech Pathology

## 2019-12-23 ENCOUNTER — Ambulatory Visit: Payer: Managed Care, Other (non HMO) | Admitting: Speech Pathology

## 2019-12-23 DIAGNOSIS — R4701 Aphasia: Secondary | ICD-10-CM

## 2019-12-23 DIAGNOSIS — R482 Apraxia: Secondary | ICD-10-CM

## 2019-12-23 DIAGNOSIS — R2689 Other abnormalities of gait and mobility: Secondary | ICD-10-CM | POA: Diagnosis not present

## 2019-12-23 DIAGNOSIS — M6281 Muscle weakness (generalized): Secondary | ICD-10-CM

## 2019-12-23 DIAGNOSIS — I69151 Hemiplegia and hemiparesis following nontraumatic intracerebral hemorrhage affecting right dominant side: Secondary | ICD-10-CM

## 2019-12-23 DIAGNOSIS — R41841 Cognitive communication deficit: Secondary | ICD-10-CM

## 2019-12-23 NOTE — Therapy (Signed)
[facility] [address] [place], [place], [address] Phone: [phone]   Fax:  [phone]  Physical Therapy Treatment  Patient Details  Name: Melanie Madden MRN: [id number] Date of Birth: 1965/12/03 Referring Provider (PT): Lauraine Rinne, [place]   Encounter Date: 12/23/2019  PT End of Session - 12/23/19 1238    Visit Number  29    Number of Visits  35    Date for PT Re-Evaluation  12/30/19    Authorization Type  [employer]- 60 VL combined PT/OT/ST - counts as 1 visit if seen on same day.  Not sure if 60 is hard max.  $50 copay each day - make sure PT/OT/ST on same day if possible    Authorization - Visit Number  --   PT/OT/ST days combined   Authorization - Number of Visits  60    PT Start Time  1235    PT Stop Time  1314    PT Time Calculation (min)  39 min    Equipment Utilized During Treatment  Gait belt   pt's R AFO; dance shoe cover to aid in foot clearance   Activity Tolerance  Patient tolerated treatment well    Behavior During Therapy  Impulsive;Agitated       Past Medical History:  Diagnosis Date  . Allergy   . Anxiety   . Arthritis   . High triglycerides   . Kidney stones   . Migraines   . Recurrent sinus infections     Past Surgical History:  Procedure Laterality Date  . ANTERIOR CRUCIATE LIGAMENT REPAIR Left   . BUNIONECTOMY Right   . LASIK    . LITHOTRIPSY    . MENISCUS REPAIR Left   . NASAL SEPTUM SURGERY    . SHOULDER SURGERY    . VARICOSE VEIN SURGERY Left     There were no vitals filed for this visit.  Subjective Assessment - 12/23/19 1241    Subjective  Pt present with new aide Dequeisha who she reports that she likes.    Patient is accompained by:  --   Caregivers   Pertinent History  anxiety, migraines, HTN    Patient Stated Goals  Per husband report they would like her to get stronger on right, improve mobility, and work on speech.    Currently in Pain?  No/denies                         [facility] Adult PT Treatment/Exercise - 12/23/19 1242      Transfers   Transfers  Sit to Stand;Stand to Sit;Stand Pivot Transfers    Sit to Stand  4: Min guard    Sit to Stand Details  Tactile cues for weight shifting;Verbal cues for technique    Stand to Sit  4: Min guard    Stand to Sit Details (indicate cue type and reason)  Tactile cues for weight shifting    Stand Pivot Transfers  4: Min guard    Stand Pivot Transfer Details (indicate cue type and reason)  w/c to/from mat    Comments  Pt performed sit to stand from mat x 6 with left hand on leg and PT in front blocking right knee and helping to facilitate right weight shift. Pt was cued to control lowering and try to keep weight more to the right. PT also provided support to right arm to protect shoulder.      Ambulation/Gait   Ambulation/Gait  Yes  Ambulation/Gait Assistance  3: Mod assist    Ambulation/Gait Assistance Details  Pt was given verbal and tactile cues to try to stand more erect and not lean as much to the left. Pt was also given verbal cues to increase left step length. PT mod assist to help with RLE to initiate hip flexion at pelvis and to help prevent ER of leg to keep in walker.    Ambulation Distance (Feet)  230 Feet    Assistive device  Rolling walker   right AFO and right hand grip attachment   Gait Pattern  Step-to pattern;Step-through pattern;Decreased step length - left;Decreased stance time - right;Decreased hip/knee flexion - right;Decreased weight shift to right    Ambulation Surface  Level;Indoor      Neuro Re-ed    Neuro Re-ed Details   Standing weight shift with PT in front to right with verbal and tactile cues to tighten gluts on right and not twist to shift right.  Seated right knee ext/flexion on roll x 10 min assist. Standing at walker with mirror in front kicking ball with right foot min assist of PT to prevent ER then stepping forward and back  x 10 with left to  facilitate right weight shift then holding left foot in front x 3 with holding x 10 sec with cues for upright posture and mirror for feedback.             PT Education - 12/23/19 2006    Education Details  Pt to continue with current HEP. PT discussed with new caregiver about transfer safety and how to safely assist patient with protecting right arm. Discussed working on sit to stands with standing in front of patient blocking right leg and trying to help her increase weight through right leg.    Person(s) Educated  Patient;Caregiver(s)    Methods  Explanation;Demonstration    Comprehension  Verbalized understanding       PT Short Term Goals - 12/02/19 1743      PT SHORT TERM GOAL #1   Title  Pt will be able to perform all transfers CGA for improved mobility.    Baseline  Pt is able to perform transfers CGA with sit to stand and stand/pivot    Time  4    Period  Weeks    Status  Achieved    Target Date  12/02/19      PT SHORT TERM GOAL #2   Title  Pt will be able to maintain standing with 1 UE support x 5 min to assist with standing ADLs supervision.    Baseline  Pt stood 5 min at walker supervision    Time  4    Period  Weeks    Status  Achieved    Target Date  12/02/19      PT SHORT TERM GOAL #3   Title  Pt will ambulate >350' with min assist of 1 person with RW with right hand grip attachment so that husband can safely ambulate with her at home.    Baseline  230' with RW mod assist.    Time  4    Period  Weeks    Status  Partially Met    Target Date  12/02/19        PT Long Term Goals - 10/28/19 2031      PT LONG TERM GOAL #1   Title  Pt will be able to perform progressive HEP for strengthening, balance and mobility with assist of  husband to continued gains at home.    Time  8    Period  Weeks    Status  On-going    Target Date  12/30/19      PT LONG TERM GOAL #2   Title  Pt will decrease 5 x sit to stand from 27 sec to <22 sec from edge of mat for improved  balance and functional mobility.    Time  8    Period  Weeks    Status  New    Target Date  12/30/19      PT LONG TERM GOAL #3   Title  Pt will ambulate >400' CGA with RW with right hand grip attachment for improved gait ability in home.    Time  8    Period  Weeks    Status  New    Target Date  12/30/19            Plan - 12/23/19 2007    Clinical Impression Statement  Pt was able to better focus during session today following commands better. She continues to need help with advancing RLE especially when trying to decrease left trunk lean.    Personal Factors and Comorbidities  Comorbidity 3+    Comorbidities  anxiety, migraines, HTN    Examination-Activity Limitations  Bathing;Bed Mobility;Stand;Locomotion Level;Squat;Stairs;Dressing;Transfers    Examination-Participation Restrictions  Community Activity;Driving;Shop;Laundry;Meal Prep    Stability/Clinical Decision Making  Evolving/Moderate complexity    Rehab Potential  Fair    PT Frequency  2x / week    PT Duration  8 weeks    PT Treatment/Interventions  ADLs/Self Care Home Management;Electrical Stimulation;DME Instruction;Gait training;Stair training;Functional mobility training;Therapeutic activities;Therapeutic exercise;Orthotic Fit/Training;Patient/family education;Neuromuscular re-education;Balance training;Manual techniques;Passive range of motion;Vestibular;Wheelchair mobility training    PT Next Visit Plan  Recert next visit.  [place] to front of right shoe to help slide. Continue with activities in standing to facilitate increased right weight shift and R hip flexion/initiation of R swing phase, continue gait training with RW with right hand grip attachment.  Pt responded well to short bout on SciFit and may benefit for use to help wtih RLE strength, NMR.    Consulted and Agree with Plan of Care  Patient    Family Member Consulted  Caregiver       Patient will benefit from skilled therapeutic intervention in order  to improve the following deficits and impairments:  Abnormal gait, Decreased activity tolerance, Decreased balance, Decreased cognition, Decreased knowledge of use of DME, Decreased mobility, Decreased range of motion, Decreased safety awareness, Decreased strength, Impaired sensation  Visit Diagnosis: Other abnormalities of gait and mobility  Muscle weakness (generalized)  Hemiplegia and hemiparesis following nontraumatic intracerebral hemorrhage affecting right dominant side [facility])     Problem List Patient Active Problem List   Diagnosis Date Noted  . 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 Nontraumatic subcortical hemorrhage of left cerebral hemisphere ([place]) 08/27/2019  . Acute blood loss anemia   . Hypoalbuminemia due to protein-calorie malnutrition ([place])   . Thrombocytopenia ([place])   . Dysphagia 07/29/2019  . Infarction of left basal ganglia ([place]) 07/18/2019  . Hypernatremia   . Leukocytosis   . Essential hypertension   . Global aphasia   . Cytotoxic brain edema ([place]) 07/10/2019  . ICH (intracerebral hemorrhage) ([place]) 07/09/2019  . Anxiety state 02/21/2015  . Cephalalgia 12/21/2014    Electa Sniff, PT, DPT, NCS 12/23/2019, 8:13 PM  [facility] [phone]  [place], [place], [address] Phone: [phone]   Fax:  [phone]  Name: Melanie Madden MRN: [id number] Date of Birth: 02-21-1966

## 2019-12-23 NOTE — Therapy (Signed)
[facility] [address] [place], [place], [address] Phone: [phone]   Fax:  [phone]  Speech Language Pathology Treatment  Patient Details  Name: Melanie Madden MRN: [id number] Date of Birth: 1965-12-04 Referring Provider (SLP): Alysia Penna, MD   Encounter Date: 12/23/2019  End of Session - 12/23/19 1402    Visit Number  26    Number of Visits  41    Date for SLP Re-Evaluation  02/26/20    Authorization Type  [employer]igna    Authorization Time Period  60 DAYS of therapy regardless of number disciplines per day    Authorization - Number of Visits  28    SLP Start Time  2505    SLP Stop Time   3976   pt had to use restroom   SLP Time Calculation (min)  37 min    Activity Tolerance  Patient tolerated treatment well       Past Medical History:  Diagnosis Date  . Allergy   . Anxiety   . Arthritis   . High triglycerides   . Kidney stones   . Migraines   . Recurrent sinus infections     Past Surgical History:  Procedure Laterality Date  . ANTERIOR CRUCIATE LIGAMENT REPAIR Left   . BUNIONECTOMY Right   . LASIK    . LITHOTRIPSY    . MENISCUS REPAIR Left   . NASAL SEPTUM SURGERY    . SHOULDER SURGERY    . VARICOSE VEIN SURGERY Left     There were no vitals filed for this visit.  Subjective Assessment - 12/23/19 1357    Subjective  Pt arrived with new caregiver, Amada Kingfisher    Patient is accompained by:  --   personal care attendant   Currently in Pain?  No/denies            ADULT SLP TREATMENT - 12/23/19 1358      General Information   Behavior/Cognition  Alert;Cooperative;Impulsive;Requires cueing      Cognitive-Linquistic Treatment   Treatment focused on  Aphasia;Apraxia    Skilled Treatment  New aide, Amada Kingfisher. Ongoing training for [place] and aide to program Lingraphica. Aide programmed chick fil a as she asked Teara if she wanted to go after therapy today. Lace affirmed she would like to  add her cats to her AAC device. Demonstrated how to add a photo icon so she can take photos of her cats. Aide also trained in this. They are to add cats to device together for [facility]      Assessment / Recommendations / [place] with current plan of care      Progression Toward Goals   Progression toward goals  Progressing toward goals       SLP Education - 12/23/19 1400    Education Details  take photo option on Lingraphica       SLP Short Term Goals - 12/23/19 1401      SLP SHORT TERM GOAL #1   Title  pt will simultaneously with SLP produce 3 different phonemes 50% of the time over three sessions    Status  Partially Met      SLP SHORT TERM GOAL #2   Title  pt will ID objects salient to pt in f:4 80% of the time over 3 sessions    Status  Deferred      SLP [place] #3   Title  pt will write her full name on first  attempt over three sessions    Time  1    Status  Deferred      SLP SHORT TERM GOAL #4   Title  pt will attempt expressive communication via multimodal means during 5 sessions    Status  Not Met      SLP SHORT TERM GOAL #5   Title  pt will demonstrate undertanding of simple 1-step functional commands with 70% accuracy with multimodal cues over 3 sessions    Status  Not Met      SLP SHORT TERM GOAL #6   Title  pt will look at object or line drawing and match the correct word from f:6 words 90% success over 3 sessions    Status  Deferred      SLP SHORT TERM GOAL #7   Title  pt will demo knowledge of pertinent /functional information placement on speech generating device 75% with rare min A    Time  4    Period  Weeks    Status  On-going      SLP SHORT TERM GOAL #8   Title  pt will demonstrate how to generate a new icon with written cues for procedure (max A for spelling) x3 sessions    Time  4    Period  Weeks    Status  New      SLP SHORT TERM GOAL  #9   TITLE  pt will communicate simple information (places, people, food, etc) using  Lingraphica with occasional min a for navigation x3 sessions    Time  4    Period  Weeks    Status  On-going       SLP Long Term Goals - 11/27/19 1707      SLP LONG TERM GOAL #1   Title  pt's diet will be appropriately upgraded as needed by SLP    Period  --   or 25 total visits, for all LTGs   Status  Deferred   pt eating preferred diet without overt s/sx aspiration PNA     SLP LONG TERM GOAL #2   Title  pt will produce 3 simple words/words pertinent to pt (family or pet names, etc) simultaneously with SLP and multimodal cues over three sessions    Status  Not Met      SLP LONG TERM GOAL #3   Title  pt will demo understanding of simple yes/no questions pertinent to desires/wants with multimodal cues 90% of the time over 3 sessions    Status  Not Met      SLP LONG TERM GOAL #4   Title  a device trial with a speech generating device will be initiated within the first 20 ST visits    Status  Achieved      SLP LONG TERM GOAL #5   Title  pt will demo recognition of written word for a common object 100% success over 3 sessions    Baseline  11-13-19, 11-20-19    Status  Achieved      Additional Long Term Goals   Additional Long Term Goals  Yes      SLP LONG TERM GOAL #6   Title  pt will work on speech-related tasks at home between 75% of sessions as reported by family and/or noted on [facility] website    Baseline  10-21-19, 10-26-19, 10-28-19, 11-11-19, 11-13-19, 11-20-19, 11-24-19, 11-27-19    Status  Partially Met      SLP LONG TERM GOAL #7  Title  pt will demo knowledge of specific pertinent /functional information placement on speech generating device 90%    Time  8    Period  Weeks   or 41 total sessions, for all LTGs   Status  New      SLP LONG TERM GOAL #8   Title  pt will demonstrate how to generate a new icon with occasional min A for procedure (max A for spelling) x3 sessions    Time  8    Period  Weeks    Status  New      SLP LONG TERM GOAL  #9   TITLE   pt will communicate feelings or other personal mod complex information using Lingraphica with rare min a for navigation x3 sessions    Time  8    Period  Weeks    Status  New       Plan - 12/23/19 1401    Clinical Impression Statement  Pt expressive language cont non-functional. She cont to demo cognitive communication deficits in at least the areas of attention (incr'd impulsivity) and awareness (appears unaware of some verbal deficits and her receptive languge deficit). Pt also cont to present with a severe/profound expressive aphasia with verbal apraxia, and mod receptive aphasia ("global aphasia"). A device trial with Lingraphica will end next week and then device will be requested again when pt's schedule returns to x2/week in June. Pt to go on hold from [place] until first week in June. See "skilled treatment" for more details of today's session. Pt may cont to benefit from skilled ST focusing on incr'ing communicative effectiveness via multimodal means (speech generating device) and incr'ing functional communication (verbal or nonverbal), incr'ing cognition in functional ways as able given pt's language deficit, and monitoring/upgrading pt's diet as appropriate.    Speech Therapy Frequency  2x / week   on hold until June 2   Duration  --   8 weeks   Treatment/Interventions  Diet toleration management by SLP;Trials of upgraded texture/liquids;Language facilitation;Internal/external aids;Cognitive reorganization;Cueing hierarchy;SLP instruction and feedback;Patient/family education;Compensatory strategies;Multimodal communcation approach;Functional tasks;Environmental controls    Potential to Achieve Goals  Fair    Potential Considerations  Severity of impairments;Cooperation/participation level       Patient will benefit from skilled therapeutic intervention in order to improve the following deficits and impairments:   Verbal apraxia  Aphasia  Cognitive communication deficit    Problem  List Patient Active Problem List   Diagnosis Date Noted  . 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 Nontraumatic subcortical hemorrhage of left cerebral hemisphere ([place]) 08/27/2019  . Acute blood loss anemia   . Hypoalbuminemia due to protein-calorie malnutrition ([place])   . Thrombocytopenia ([place])   . Dysphagia 07/29/2019  . Infarction of left basal ganglia ([place]) 07/18/2019  . Hypernatremia   . Leukocytosis   . Essential hypertension   . Global aphasia   . Cytotoxic brain edema ([place]) 07/10/2019  . ICH (intracerebral hemorrhage) ([place]) 07/09/2019  . Anxiety state 02/21/2015  . Cephalalgia 12/21/2014    Maitland Lesiak, Annye Rusk MS, CCC-SLP 12/23/2019, 2:03 PM  [facility] [address] [place], [place], [address] Phone: [phone]   Fax:  [phone]   Name: Carlisa Eble MRN: [id number] Date of Birth: 10-24-1965

## 2019-12-24 ENCOUNTER — Telehealth: Payer: Self-pay
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
## 2019-12-24 NOTE — Telephone Encounter (Signed)
PT called pt's husband, [name], back regarding question on brace. He was wondering about getting another AFO to use in different shoes that are only worn in house as they don't usually wear shoes in home. PT advised he could just switch AFO out between shoes for this purpose. He also said he figured out how she was getting bruise on back area as she is sitting down hard on toilet and will not let him help. He is trying to find solution to pad the back of commode more. He will also be calling Dr. Letta Pate as is concerned about what looks like bruising on her face that she has had since leaving inpatient rehab months ago when she had a fall. No swelling noted. PT let him know that pt was in good spirits yesterday and he said she seems much happier when he got home from work as well.  Cherly Anderson, PT, DPT, NCS
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
## 2019-12-25 NOTE — Telephone Encounter (Signed)
I spoke with Mr Melanie Madden and gave hime the information.  It is not expanding so it is not an emergent issue. He will either make appt with Dr Letta Pate or see PCP.

## 2019-12-29 ENCOUNTER — Emergency Department (HOSPITAL_COMMUNITY)
Admission: EM | Admit: 2019-12-29 | Discharge: 2019-12-29 | Disposition: A | Discharge status: Home / Self Care | Payer: Managed Care, Other (non HMO) | Attending: Emergency Medicine | Admitting: Emergency Medicine

## 2019-12-29 ENCOUNTER — Other Ambulatory Visit: Payer: Self-pay

## 2019-12-29 ENCOUNTER — Encounter (HOSPITAL_COMMUNITY): Payer: Self-pay | Admitting: Emergency Medicine

## 2019-12-29 ENCOUNTER — Emergency Department (HOSPITAL_COMMUNITY): Payer: Managed Care, Other (non HMO)

## 2019-12-29 DIAGNOSIS — S01311A Laceration without foreign body of right ear, initial encounter: Secondary | ICD-10-CM | POA: Diagnosis not present

## 2019-12-29 DIAGNOSIS — Z87891 Personal history of nicotine dependence: Secondary | ICD-10-CM | POA: Diagnosis not present

## 2019-12-29 DIAGNOSIS — Y999 Unspecified external cause status: Secondary | ICD-10-CM | POA: Insufficient documentation

## 2019-12-29 DIAGNOSIS — Y939 Activity, unspecified: Secondary | ICD-10-CM | POA: Insufficient documentation

## 2019-12-29 DIAGNOSIS — Z79899 Other long term (current) drug therapy: Secondary | ICD-10-CM | POA: Insufficient documentation

## 2019-12-29 DIAGNOSIS — Y929 Unspecified place or not applicable: Secondary | ICD-10-CM | POA: Insufficient documentation

## 2019-12-29 DIAGNOSIS — I10 Essential (primary) hypertension: Secondary | ICD-10-CM | POA: Insufficient documentation

## 2019-12-29 DIAGNOSIS — Z23 Encounter for immunization: Secondary | ICD-10-CM | POA: Insufficient documentation

## 2019-12-29 DIAGNOSIS — W07XXXA Fall from chair, initial encounter: Secondary | ICD-10-CM | POA: Diagnosis not present

## 2019-12-29 DIAGNOSIS — W19XXXA Unspecified fall, initial encounter: Secondary | ICD-10-CM

## 2019-12-29 DIAGNOSIS — S0990XA Unspecified injury of head, initial encounter: Secondary | ICD-10-CM

## 2019-12-29 MED ORDER — TETANUS-DIPHTH-ACELL PERTUSSIS 5-2.5-18.5 LF-MCG/0.5 IM SUSP
0.5000 mL | Freq: Once | INTRAMUSCULAR | Status: AC
  Administered 2019-12-29: 0.5 mL via INTRAMUSCULAR
  Filled 2019-12-29: qty 0.5

## 2019-12-29 NOTE — Discharge Instructions (Addendum)
Ms. [name] fell struck the side of her head but did not lose consciousness.  CT scan shows no signs of acute bleeds or fractures.  There is a small laceration to the posterior portion of the right ear.  It was washed out while in the ED.  It does not require suturing due to its nature.  Recommend over-the-counter triple antibiotic ointment 3 times a day.  Tdap was given, follow-up with your primary care physician.

## 2019-12-29 NOTE — ED Triage Notes (Signed)
Pt arrives with aid from home after a fall out of kitchen chair today. 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 Aid denies LOC. 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 Pt was wearing a headband and has laceration behind ear. Husband denies she is on blood thinners.

## 2019-12-29 NOTE — ED Notes (Signed)
Pt refusing to change into gown or leave wheelchair

## 2019-12-29 NOTE — ED Provider Notes (Signed)
[facility] DEPART[facility] Provider Note   CSN: [id number] Arrival date & time: 12/29/19  1434     History Chief Complaint  Patient presents with  . Melanie Madden is a 54 y.o. female.  Patient presenting from home after a witnessed fall out of the chair, striking the right side of her head as well as receiving a laceration to the right ear from hair comb/clip.  No LOC.  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  Patient spouse at bedside  The history is provided by the patient, the spouse and medical records. The history is limited by the condition of the patient.  Illness Location:  Head/ear Quality:  Trauma Severity:  Mild Onset quality:  Sudden Timing:  Constant Progression:  Resolved Chronicity:  New Context:  Patient mechanical fall out of chair while trying to put a sweatshirt on, striking the right side of her head and receiving a small laceration to the posterior portion of the right ear. Relieved by:  Direct pressure, antibiotic ointment Worsened by:  Nothing Ineffective treatments:  None tried Associated symptoms: no abdominal pain, no chest pain, no fever, no loss of consciousness, no shortness of breath and no vomiting        Past Medical History:  Diagnosis Date  . Allergy   . Anxiety   . Arthritis   . High triglycerides   . Kidney stones   . Migraines   . Recurrent sinus infections     Patient Active Problem List   Diagnosis Date Noted  . 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 Acute blood loss anemia   . Hypoalbuminemia due to protein-calorie malnutrition ([place])   . Thrombocytopenia ([place])   . Dysphagia 07/29/2019  . 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 Hypernatremia   . Leukocytosis   . Essential hypertension   . Global aphasia   . Cytotoxic  brain edema ([place]) 07/10/2019  . ICH (intracerebral hemorrhage) ([place]) 07/09/2019  . Anxiety state 02/21/2015  . Cephalalgia 12/21/2014    Past Surgical History:  Procedure Laterality Date  . ANTERIOR CRUCIATE LIGAMENT REPAIR Left   . BUNIONECTOMY Right   . LASIK    . LITHOTRIPSY    . MENISCUS REPAIR Left   . NASAL SEPTUM SURGERY    . SHOULDER SURGERY    . VARICOSE VEIN SURGERY Left      OB History   No obstetric history on file.     Family History  Problem Relation Age of Onset  . Alzheimer's disease Mother   . Hypertension Mother   . Diabetes Mother   . Thyroid disease Mother   . Deep vein thrombosis Maternal Grandmother   . Colon cancer Neg Hx   . Colon polyps Neg Hx   . Esophageal cancer Neg Hx   . Pancreatic cancer Neg Hx   . Rectal cancer Neg Hx   . Stomach cancer Neg Hx   . Breast cancer Neg Hx     Social History   Tobacco Use  . Smoking status: Former Research scientist (life sciences)  . Smokeless tobacco: Never Used  Substance Use Topics  . Alcohol use: Yes    Alcohol/week: 0.0 standard drinks    Comment: social   . Drug use: No    Home Medications Prior to Admission medications   Medication Sig Start Date End Date Taking?  Authorizing Provider  amLODipine (NORVASC) 10 MG tablet Take 10 mg by mouth daily.    [provider]  atorvastatin (LIPITOR) 20 MG tablet Take 1 tablet (20 mg total) by mouth daily at 6 PM. 08/20/19   Angiulli, Lavon Paganini, PA-C  B Complex-C (B-COMPLEX WITH VITAMIN C) tablet Take 1 tablet by mouth daily.    [provider]  BIOTIN PO Take 1 tablet by mouth daily.     [provider]  cholecalciferol (VITAMIN D) 1000 units tablet Take 1,000 Units by mouth daily. 2,000 units daily    [provider]  Cyanocobalamin (VITAMIN B-12 PO) Take by mouth.    [provider]  docusate sodium (COLACE) 100 MG capsule Take 100 mg by mouth daily.    [provider]  GARLIC PO Take 1 tablet by mouth daily.    [provider]  Ginkgo Biloba (GINKOBA PO) Take 1 tablet by mouth daily.    [provider]  LORazepam (ATIVAN) 1 MG tablet Take 1 tablet (1 mg total) by mouth every 8 (eight) hours as needed for anxiety. 08/29/19   Milton [place], MD  Omega-3 Fatty Acids (FISH OIL) 1200 MG CPDR Take by mouth.    [provider]  QUEtiapine (SEROQUEL) 25 MG tablet Take 1 tablet (25 mg total) by mouth 2 (two) times daily. 12/01/19   Kirsteins, [name], MD  VITAMIN E PO Take 1 tablet by mouth daily.     [provider]  zinc sulfate (ZINC-220) 220 (50 Zn) MG capsule Take 220 mg by mouth daily.    [provider]    Allergies    Amoxicillin and Shellfish allergy  Review of Systems   Review of Systems  Constitutional: Negative for fever.  Respiratory: Negative for shortness of breath.   Cardiovascular: Negative for chest pain.  Gastrointestinal: Negative for abdominal pain and vomiting.  Skin: Positive for wound.  Neurological: Negative for loss of consciousness.  All other systems reviewed and are negative.   Physical Exam Updated Vital Signs BP (!) 156/111 (BP Location: Right Arm)   Pulse (!) 16   Temp 98.3 F (36.8 C) (Oral)   Resp 16   SpO2 99%   Physical Exam Vitals and nursing note reviewed.  Constitutional:      General: She is not in acute distress.    Appearance: She is well-developed.  HENT:     Head: Normocephalic and atraumatic.      Left Ear: External ear normal.     Ears:     Comments: Patient has small 3 cm superficial hemostatic laceration/avulsion to the posterior aspect of the right ear.    Mouth/Throat:     Mouth: Mucous membranes are moist.  Eyes:     Extraocular Movements: Extraocular movements intact.     Conjunctiva/sclera: Conjunctivae normal.  Cardiovascular:     Rate and Rhythm: Normal rate and regular rhythm.     Heart sounds: No murmur.  Pulmonary:     Effort: Pulmonary effort is normal. No respiratory distress.     Breath  sounds: Normal breath sounds.  Abdominal:     Palpations: Abdomen is soft.     Tenderness: There is no abdominal tenderness.  Musculoskeletal:        General: Normal range of motion.     Cervical back: Neck supple.  Skin:    General: Skin is warm and dry.  Neurological:     Mental Status: She is alert. Mental status is  at baseline.     ED Results / Procedures / Treatments   Labs (all labs ordered are listed, but only abnormal results are displayed) Labs Reviewed - No data to display  EKG None  Radiology CT Head Wo Contrast  Result Date: 12/29/2019 CLINICAL DATA:  Headache. History of head trauma. Prior intracerebral hemorrhage. EXAM: CT HEAD WITHOUT CONTRAST TECHNIQUE: Contiguous axial images were obtained from the base of the skull through the vertex without intravenous contrast. COMPARISON:  12/19/2019 FINDINGS: Brain: Significantly age advanced cerebral atrophy. Moderate low density in the periventricular white matter likely related to small vessel disease. More focal hypoattenuation within the deep white matter of the left cerebral hemisphere, consistent with encephalomalacia. No mass lesion, hemorrhage, hydrocephalus, acute infarct, intra-axial, or extra-axial fluid collection. Vascular: No hyperdense vessel or unexpected calcification. Skull: No significant soft tissue swelling. Hyperostosis frontalis interna. Sinuses/Orbits: Normal imaged portions of the orbits and globes. Clear paranasal sinuses and mastoid air cells. Other: None. IMPRESSION: 1. No acute intracranial abnormality. 2. Significantly age advanced cerebral atrophy and small vessel ischemic change. 3. 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 Electronically Signed   By: Abigail Miyamoto M.D.   On: 12/29/2019 16:56    Procedures Procedures (including critical care time)  Medications Ordered in ED Medications  Tdap (BOOSTRIX) injection 0.5 mL (0.5 mLs Intramuscular Given 12/29/19 2033)      ED Course  I have reviewed the triage vital signs and the nursing notes.  Pertinent labs & imaging results that were available during my care of the patient were reviewed by me and considered in my medical decision making (see chart for details).    MDM Rules/Calculators/A&P                      Differential diagnosis: Head trauma, hemorrhage, ear laceration  ED physician interpretation of imaging: CT head without acute bleeds or acute changes.    MDM: Patient presented to the ED after a witnessed fall at home that was mechanical in nature and due to patient's baseline disability.  No acute findings on CT.  Patient has a small hemostatic laceration to the back of the right ear.  Injury is more avulsion the laceration.  Based on patient's disability, type of injury to the ear without cartilage involvement or deformity healing by secondary intention is indicated.  Wound cleaned with normal saline, all dried blood removed.  Attempted to place Steri-Strips but patient pulled them off to to her disability.  Wound is well approximated, antibiotic ointment applied.  Tdap given as patient likely has cut from metal hair comb.  Diagnosis, treatment and plan of care was discussed and agreed upon with patient and family.  Patient and family comfortable with discharge at this time.   Key discharge instructions: Ms. [name] fell struck the side of her head but did not lose consciousness.  CT scan shows no signs of acute bleeds or fractures.  There is a small laceration to the posterior portion of the right ear.  It was washed out while in the ED.  It does not require suturing due to its nature.  Recommend over-the-counter triple antibiotic ointment 3 times a day.  Tdap was given, follow-up with your primary care physician.    Final Clinical Impression(s) / ED Diagnoses Final diagnoses:  Fall, initial encounter  Minor head injury, initial encounter  Laceration of auricle of right ear, initial encounter     Rx / DC Orders ED Discharge Orders  None       Delma Post, MD 12/30/19 [place], DO 12/30/19 1525

## 2019-12-29 NOTE — ED Notes (Addendum)
Patient and caregiver verbalize understanding of discharge instructions. Opportunity for questioning and answers were provided. Armband removed by staff, pt discharged from ED in wheelchair to lobby with Maudie Mercury, RN until caregiver arrives with transportation to home.

## 2019-12-30 ENCOUNTER — Ambulatory Visit: Payer: Managed Care, Other (non HMO) | Admitting: Speech Pathology

## 2019-12-30 ENCOUNTER — Ambulatory Visit: Payer: Managed Care, Other (non HMO) | Admitting: Occupational Therapy

## 2019-12-30 ENCOUNTER — Ambulatory Visit: Payer: Managed Care, Other (non HMO) | Attending: Physical Medicine and Rehabilitation

## 2019-12-30 DIAGNOSIS — I69153 Hemiplegia and hemiparesis following nontraumatic intracerebral hemorrhage affecting right non-dominant side: Secondary | ICD-10-CM | POA: Diagnosis present

## 2019-12-30 DIAGNOSIS — M6281 Muscle weakness (generalized): Secondary | ICD-10-CM | POA: Insufficient documentation

## 2019-12-30 DIAGNOSIS — I69219 Unspecified symptoms and signs involving cognitive functions following other nontraumatic intracranial hemorrhage: Secondary | ICD-10-CM | POA: Insufficient documentation

## 2019-12-30 DIAGNOSIS — R209 Unspecified disturbances of skin sensation: Secondary | ICD-10-CM | POA: Insufficient documentation

## 2019-12-30 DIAGNOSIS — R2689 Other abnormalities of gait and mobility: Secondary | ICD-10-CM | POA: Diagnosis present

## 2019-12-30 DIAGNOSIS — R482 Apraxia: Secondary | ICD-10-CM

## 2019-12-30 DIAGNOSIS — R208 Other disturbances of skin sensation: Secondary | ICD-10-CM

## 2019-12-30 DIAGNOSIS — R41841 Cognitive communication deficit: Secondary | ICD-10-CM

## 2019-12-30 DIAGNOSIS — I69151 Hemiplegia and hemiparesis following nontraumatic intracerebral hemorrhage affecting right dominant side: Secondary | ICD-10-CM | POA: Insufficient documentation

## 2019-12-30 DIAGNOSIS — R4701 Aphasia: Secondary | ICD-10-CM | POA: Diagnosis present

## 2019-12-30 NOTE — Therapy (Signed)
[facility] [address] [place], [place], [address] Phone: [phone]   Fax:  [phone]  Speech Language Pathology Treatment  Patient Details  Name: Melanie Madden MRN: [id number] Date of Birth: [birth year] Referring Provider (SLP): Alysia Penna, MD   Encounter Date: 12/30/2019  End of Session - 12/30/19 1442    Visit Number  27    Number of Visits  41    Date for SLP Re-Evaluation  02/26/20    Authorization Type  cigna    Authorization Time Period  8 DAYS of therapy regardless of number disciplines per day    Authorization - Visit Number  67    Authorization - Number of Visits  64    SLP Start Time  1320    SLP Stop Time   1400    SLP Time Calculation (min)  40 min    Activity Tolerance  Treatment limited secondary to agitation       Past Medical History:  Diagnosis Date  . Allergy   . Anxiety   . Arthritis   . High triglycerides   . Kidney stones   . Migraines   . Recurrent sinus infections     Past Surgical History:  Procedure Laterality Date  . ANTERIOR CRUCIATE LIGAMENT REPAIR Left   . BUNIONECTOMY Right   . LASIK    . LITHOTRIPSY    . MENISCUS REPAIR Left   . NASAL SEPTUM SURGERY    . SHOULDER SURGERY    . VARICOSE VEIN SURGERY Left     There were no vitals filed for this visit.  Subjective Assessment - 12/30/19 1434    Subjective  "Da" re: how are you    Patient is accompained by:  --   Joanell Rising           ADULT SLP TREATMENT - 12/30/19 1435      General Information   Behavior/Cognition  Alert;Cooperative;Impulsive;Requires cueing      Cognitive-Linquistic Treatment   Treatment focused on  Aphasia;Apraxia;Patient/family/caregiver education    Skilled Treatment  Roseanne Kaufman and Haeleigh have not completed HW of adding cats to AAC device. Roseanne Kaufman reports Reghan has not done ST HW on days when she is with her. Ongoing training in programming AAC device. [name]  required conistent max A to select icons to add new icon to device. Roseanne Kaufman added photo icon of myself for training purposes with occasional min A., after demonstration.  They are to add Lynel's pets for [facility]. Demonstrated how to access therapy activities/exercises on [facility]. Lexie with frustration throughout session affecting her participation      Assessment / Recommendations / [place] with current plan of care      Progression Toward Goals   Progression toward goals  Not progressing toward goals (comment)   low frustration tolerance      SLP Education - 12/30/19 1439    Education Details  steps to add photos to AAC device; access speech therapy exercises for [facility]    Person(s) Educated  Patient;Caregiver(s)    Methods  Explanation;Demonstration;Verbal cues;Handout   demonstration   Comprehension  Verbalized understanding;Returned demonstration;Verbal cues required;Need further instruction       SLP Short Term Goals - 12/30/19 1441      SLP SHORT TERM GOAL #1   Title  pt will simultaneously with SLP produce 3 different phonemes 50% of the time over three sessions    Status  Partially Met  SLP SHORT TERM GOAL #2   Title  pt will ID objects salient to pt in f:4 80% of the time over 3 sessions    Status  Deferred      SLP SHORT TERM GOAL #3   Title  pt will write her full name on first attempt over three sessions    Time  1    Status  Deferred      SLP SHORT TERM GOAL #4   Title  pt will attempt expressive communication via multimodal means during 5 sessions    Status  Not Met      SLP SHORT TERM GOAL #5   Title  pt will demonstrate undertanding of simple 1-step functional commands with 70% accuracy with multimodal cues over 3 sessions    Status  Not Met      SLP SHORT TERM GOAL #6   Title  pt will look at object or line drawing and match the correct word from f:6 words 90% success over 3 sessions    Status  Deferred      SLP SHORT TERM GOAL #7   Title   pt will demo knowledge of pertinent /functional information placement on speech generating device 75% with rare min A    Time  4    Period  Weeks    Status  On-going      SLP SHORT TERM GOAL #8   Title  pt will demonstrate how to generate a new icon with written cues for procedure (max A for spelling) x3 sessions    Time  4    Period  Weeks    Status  New      SLP SHORT TERM GOAL  #9   TITLE  pt will communicate simple information (places, people, food, etc) using Lingraphica with occasional min a for navigation x3 sessions    Time  4    Period  Weeks    Status  On-going       SLP Long Term Goals - 12/30/19 1441      SLP LONG TERM GOAL #1   Title  pt's diet will be appropriately upgraded as needed by SLP    Period  --   or 25 total visits, for all LTGs   Status  Deferred   pt eating preferred diet without overt s/sx aspiration PNA     SLP LONG TERM GOAL #2   Title  pt will produce 3 simple words/words pertinent to pt (family or pet names, etc) simultaneously with SLP and multimodal cues over three sessions    Status  Not Met      SLP LONG TERM GOAL #3   Title  pt will demo understanding of simple yes/no questions pertinent to desires/wants with multimodal cues 90% of the time over 3 sessions    Status  Not Met      SLP LONG TERM GOAL #4   Title  a device trial with a speech generating device will be initiated within the first 20 ST visits    Status  Achieved      SLP LONG TERM GOAL #5   Title  pt will demo recognition of written word for a common object 100% success over 3 sessions    Baseline  11-13-19, 11-20-19    Status  Achieved      SLP LONG TERM GOAL #6   Title  pt will work on speech-related tasks at home between 75% of sessions as reported by family and/or  noted on [facility] website    Baseline  10-21-19, 10-26-19, 10-28-19, 11-11-19, 11-13-19, 11-20-19, 11-24-19, 11-27-19    Status  Partially Met      SLP LONG TERM GOAL #7   Title  pt will demo knowledge  of specific pertinent /functional information placement on speech generating device 90%    Time  7    Period  Weeks   or 41 total sessions, for all LTGs   Status  On-going      SLP LONG TERM GOAL #8   Title  pt will demonstrate how to generate a new icon with occasional min A for procedure (max A for spelling) x3 sessions    Time  7    Period  Weeks    Status  On-going      SLP LONG TERM GOAL  #9   TITLE  pt will communicate feelings or other personal mod complex information using Lingraphica with rare min a for navigation x3 sessions    Time  7    Period  Weeks    Status  On-going       Plan - 12/30/19 1441    Clinical Impression Statement  Pt expressive language cont non-functional. She cont to demo cognitive communication deficits in at least the areas of attention (incr'd impulsivity) and awareness (appears unaware of some verbal deficits and her receptive languge deficit). Pt also cont to present with a severe/profound expressive aphasia with verbal apraxia, and mod receptive aphasia ("global aphasia"). A device trial with Lingraphica will end next week and then device will be requested again when pt's schedule returns to x2/week in June. Pt to go on hold from [facility] until first week in June. See "skilled treatment" for more details of today's session. Pt may cont to benefit from skilled ST focusing on incr'ing communicative effectiveness via multimodal means (speech generating device) and incr'ing functional communication (verbal or nonverbal), incr'ing cognition in functional ways as able given pt's language deficit, and monitoring/upgrading pt's diet as appropriate.    Speech Therapy Frequency  2x / week    Duration  --   8 weeks or 17 visits   Treatment/Interventions  Diet toleration management by SLP;Trials of upgraded texture/liquids;Language facilitation;Internal/external aids;Cognitive reorganization;Cueing hierarchy;SLP instruction and feedback;Patient/family education;Compensatory  strategies;Multimodal communcation approach;Functional tasks;Environmental controls    Potential to [place]       Patient will benefit from skilled therapeutic intervention in order to improve the following deficits and impairments:   Aphasia  Cognitive communication deficit  Verbal apraxia    Problem List Patient Active Problem List   Diagnosis Date Noted  . 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 Acute blood loss anemia   . Hypoalbuminemia due to protein-calorie malnutrition ([place])   . Thrombocytopenia ([place])   . Dysphagia 07/29/2019  . 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 Hypernatremia   . Leukocytosis   . Essential hypertension   . Global aphasia   . Cytotoxic brain edema ([place]) 07/10/2019  . ICH (intracerebral hemorrhage) ([place]) 07/09/2019  . Anxiety state 02/21/2015  . Cephalalgia 12/21/2014    [place] Seitzinger, Annye Rusk MS, CCC-SLP 12/30/2019, 2:43 PM  [facility] [address] [place], [place], [address] Phone: [phone]   Fax:  [phone]   Name: Melanie Madden MRN: [id number] Date of Birth: 1965-12-07

## 2019-12-30 NOTE — Therapy (Signed)
[facility] [address] [place], [place], [address] Phone: [phone]   Fax:  [phone]  Occupational Therapy Treatment  Patient Details  Name: Melanie Madden MRN: [id number] Date of Birth: 1966/03/01 Referring Provider (OT): Dr. Alysia Penna   Encounter Date: 12/30/2019  OT End of Session - 12/30/19 1236    Visit Number  27    Number of Visits  40    Date for OT Re-Evaluation  01/27/20    Authorization Type  [employer] 2021:  No prior auth OT, 60 DAY visit limit (regardless of how many therapies each day).    Authorization - Visit Number  35   32 combined   Authorization - Number of Visits  60    OT Start Time  1230    OT Stop Time  1315    OT Time Calculation (min)  45 min    Activity Tolerance  Patient tolerated treatment well    Behavior During Therapy  Impulsive;Agitated       Past Medical History:  Diagnosis Date  . Allergy   . Anxiety   . Arthritis   . High triglycerides   . Kidney stones   . Migraines   . Recurrent sinus infections     Past Surgical History:  Procedure Laterality Date  . ANTERIOR CRUCIATE LIGAMENT REPAIR Left   . BUNIONECTOMY Right   . LASIK    . LITHOTRIPSY    . MENISCUS REPAIR Left   . NASAL SEPTUM SURGERY    . SHOULDER SURGERY    . VARICOSE VEIN SURGERY Left     There were no vitals filed for this visit.  Subjective Assessment - 12/30/19 1223    Patient is accompanied by:  --   aide   Pertinent History  Subcortical hemorrhage of R cerbral hemisphere.  PMH:  HTN, anxiety, migraines, hx of shoulder injection approx 2 weeks ago for R shoulder pain, hx of R shoulder surgery (arthroscopic)    Limitations  aphasia, fall risk, impulsive (husband reports hx of OCD)    Special Tests  PER HUSBAND for sit>stand:  pt responds to "big push" with knee blocked on both side    Patient Stated Goals  husband reports: to be able to care for herself more (pt indicates  that she onces to return to previous activities per questioning/gestures)    Currently in Pain?  No/denies       Began discussion of anticipated d/c end of this month d/t lack of progress in RUE and frustration level during therapy, as well as some refusal to work on things at home.  New caregiver present for discussion and further explained rationale with her while pt on estim at end of session.  Worked on closed chain AA/ROM w/ tilted stool and min activation RUE - min assist provided. Wt bearing over RUE as tolerated - pt still hesitant to go fully over RUE even with support. Wt bearing into compliant surface for visual and proprioceptive feedback NMES x 15 min to wrist and finger extensors at previous parameters, Intensity b/t 19-22                      OT Short Term Goals - 11/27/19 1216      OT SHORT TERM GOAL #1   Title  Pt/husband will be independent with initial HEP for RUE ROM--check STGs 10/14/19    Time  6    Period  Weeks  Status  Achieved      OT SHORT TERM GOAL #2   Title  Pt will perform UB dressing with min A.    Time  6    Period  Weeks    Status  Achieved   MOD ASSIST.  11/11/19:  pt able to don/doff zip-up fleece mod I.  11/17/19:  met per husband report     OT SHORT TERM GOAL #3   Title  Pt will perform UB bathing with mod A.    Time  6    Period  Weeks    Status  Achieved      OT SHORT TERM GOAL #4   Title  Pt will be able to brush hair with set-up.    Time  4    Period  Weeks    Status  On-going   fluctuating assist needed   Target Date  12/27/19      OT SHORT TERM GOAL #5   Title  Pt will demo at least 110* shoulder flex PROM in supine without pain for incr ease for ADLs.    Time  6    Period  Weeks    Status  Achieved      OT SHORT TERM GOAL #6   Title  Pt will be able to stabilze light object in [place] for low level bilateral reach.--check updated STGs 12/27/19    Time  4    Period  Weeks    Status  New    Target Date   12/27/19      OT SHORT TERM GOAL #7   Title  Pt will be able to use RUE as a stablizer consistently for selected tasks mod I.    Time  4    Period  Weeks    Status  New    Target Date  12/27/19        OT Long Term Goals - 11/27/19 1205      OT LONG TERM GOAL #1   Title  Pt/husband will be independent with HEP for functional activities for incr participation in IADLs/leisure tasks (and incorporating RUE if able).--check updated LTGs 01/27/20    Time  8    Period  Weeks    Status  On-going      OT LONG TERM GOAL #2   Title  Pt will perform UB dressing with supervision.    Time  12    Period  Weeks    Status  Achieved   11/24/19     OT LONG TERM GOAL #3   Title  Pt will perform LB dressing with mod A.    Time  12    Period  Weeks    Status  Achieved   11/17/19:  min A per husband report     OT LONG TERM GOAL #4   Title  Pt will perform UB bathing with min A.    Time  12    Period  Weeks    Status  Achieved   11/17/19:  met per husband report     OT LONG TERM GOAL #5   Title  Pt will perform simple cold snack prep  with min A/set up.    Time  8    Period  Weeks    Status  Revised   11/24/19:  not performing, not using w/c in the home anymore     OT LONG TERM GOAL #6   Title  Pt will perform toileting with mod  A.    Time  12    Period  Weeks    Status  Achieved   11/24/19:  CGA/close supervision for ambulation/standing per aide report     OT LONG TERM GOAL #7   Title  Pt will demo at least 20* active shoulder flex in prep for functional low-level reach.    Time  8    Period  Weeks    Status  New      OT LONG TERM GOAL #8   Title  Pt will be able to weight bear through RUE in standing at counter to assist with ADL/IADL tasks.    Time  8    Period  Weeks    Status  New            Plan - 12/30/19 1430    Clinical Impression Statement  Pt limited in progres d/t severity of deficits in RUE as well as frustration, distraction, impulsivity.    Occupational  performance deficits (Please refer to evaluation for details):  ADL's;IADL's;Leisure;Work;Social Participation    Body Structure / Function / Physical Skills  ADL;ROM;IADL;Sensation;Mobility;Strength;Tone;UE functional use;Decreased knowledge of precautions;Decreased knowledge of use of DME;Coordination;Balance;FMC;GMC    Cognitive Skills  Attention;Temperament/Personality;Understand;Thought;Safety Awareness    OT Frequency  2x / week    OT Duration  8 weeks   may be modified d/t scheduling needs   OT Treatment/Interventions  Self-care/ADL training;Moist Heat;DME and/or AE instruction;Splinting;Therapeutic activities;Aquatic Therapy;Cognitive remediation/compensation;Therapeutic exercise;Cryotherapy;Neuromuscular education;Functional Mobility Training;Passive range of motion;Visual/perceptual remediation/compensation;Patient/family education;Manual Therapy;Electrical Stimulation;Fluidtherapy    Plan  retrieve items from refridgerator from w/c level, and work on sit to stand at counter to retrieve items from higher shelf    Consulted and Agree with Plan of Care  Patient;Other (Comment)    Family Member Consulted  caregiver       Patient will benefit from skilled therapeutic intervention in order to improve the following deficits and impairments:   Body Structure / Function / Physical Skills: ADL, ROM, IADL, Sensation, Mobility, Strength, Tone, UE functional use, Decreased knowledge of precautions, Decreased knowledge of use of DME, Coordination, Balance, FMC, GMC Cognitive Skills: Attention, Temperament/Personality, Understand, Thought, Safety Awareness     Visit Diagnosis: Hemiplegia and hemiparesis following nontraumatic intracerebral hemorrhage affecting right dominant side (HCC)  Muscle weakness (generalized)  Other disturbances of skin sensation  Unspecified symptoms and signs involving cognitive functions following other nontraumatic intracranial hemorrhage    Problem List Patient  Active Problem List   Diagnosis Date Noted  . 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 Nontraumatic subcortical hemorrhage of left cerebral hemisphere ([place]) 08/27/2019  . Acute blood loss anemia   . Hypoalbuminemia due to protein-calorie malnutrition ([place])   . Thrombocytopenia ([place])   . Dysphagia 07/29/2019  . Infarction of left basal ganglia ([place]) 07/18/2019  . Hypernatremia   . Leukocytosis   . Essential hypertension   . Global aphasia   . Cytotoxic brain edema ([place]) 07/10/2019  . ICH (intracerebral hemorrhage) ([place]) 07/09/2019  . Anxiety state 02/21/2015  . Cephalalgia 12/21/2014    Carey Bullocks, OTR/L 12/30/2019, 2:36 PM  [facility] [address] [place], [place], [address] Phone: [phone]   Fax:  [phone]  Name: Elowyn Raupp Da Glennie Madden MRN: [id number] Date of Birth: [birth year]

## 2019-12-30 NOTE — Patient Instructions (Addendum)
  Add cat photos to Things important to Me folder  1. Open the Important to me icon  2. Hit orange button in top right  3. Add icon  4. Take photo  5. Edit text to put in their names  Add Terrie as well  You can take photos of friends and family pictures on you computer to add them to your device  To do Speech HW  Go to Therapy Activivites  Then Therapy Exercises

## 2019-12-30 NOTE — Therapy (Signed)
[facility] [address] [place], [place], [address] Phone: [phone]   Fax:  [phone]  Physical Therapy Treatment/Recert  Patient Details  Name: Melanie Madden MRN: [id number] Date of Birth: 03-Jul-1966 Referring Provider (PT): Lauraine Rinne, [place]   Encounter Date: 12/30/2019  PT End of Session - 12/30/19 1117    Visit Number  30    Number of Visits  54    Date for PT Re-Evaluation  03/29/20    Authorization Type  [employer]- 60 VL combined PT/OT/ST - counts as 1 visit if seen on same day.  Not sure if 60 is hard max.  $50 copay each day - make sure PT/OT/ST on same day if possible    Authorization - Visit Number  --   PT/OT/ST days combined   Authorization - Number of Visits  60    PT Start Time  1111    PT Stop Time  1155    PT Time Calculation (min)  44 min    Equipment Utilized During Treatment  Gait belt   pt's R AFO; dance shoe cover to aid in foot clearance   Activity Tolerance  Patient tolerated treatment well    Behavior During Therapy  Impulsive       Past Medical History:  Diagnosis Date  . Allergy   . Anxiety   . Arthritis   . High triglycerides   . Kidney stones   . Migraines   . Recurrent sinus infections     Past Surgical History:  Procedure Laterality Date  . ANTERIOR CRUCIATE LIGAMENT REPAIR Left   . BUNIONECTOMY Right   . LASIK    . LITHOTRIPSY    . MENISCUS REPAIR Left   . NASAL SEPTUM SURGERY    . SHOULDER SURGERY    . VARICOSE VEIN SURGERY Left     There were no vitals filed for this visit.  Subjective Assessment - 12/30/19 1115    Subjective  Pt walked in to therapy with hemiwalker today with aide supervising. Did have a fall yesterday with ER visit as hit right ear. Imaging was negative.    Patient is accompained by:  --   Caregivers   Pertinent History  anxiety, migraines, HTN    Patient Stated Goals  Per husband report they would like her to get stronger  on right, improve mobility, and work on speech.    Currently in Pain?  No/denies                        [facility] Adult PT Treatment/Exercise - 12/30/19 1118      Transfers   Transfers  Sit to Stand;Stand to [employer] Transfers    Sit to Stand  5: Supervision;4: Min guard    Sit to Stand Details  Verbal cues for technique    Sit to Stand Details (indicate cue type and reason)  Pt was cued to get right foot in proper place prior to rising    Five time sit to stand comments   22 sec from mat with LUE support    Stand to Sit  4: Min guard    Stand Pivot Transfers  4: Min guard      Ambulation/Gait   Ambulation/Gait  Yes    Ambulation/Gait Assistance  4: Min guard    Ambulation/Gait Assistance Details  Pt ambulated in to clinic with hemiwalker holding right hand onto handle with left hand. Utilized givmore  sling 3rd bout to allow patient to not have to hold on to right hand and provide more support for right arm/shoulder. Pt utilizing trunk lean to help with right leg advancement. Getting more step-to pattern. Pt was cued to try to stand as erect as possible and increase right weight shift. Pt not wanting PT to physically help with weight shift swatting therapist's hand away. PT added danceskin to front of right shoe during 3rd bout as well to help with advancement.     Ambulation Distance (Feet)  100 Feet   115' x 2, 100' x 1   Assistive device  Hemi-walker    Gait Pattern  Step-to pattern;Decreased stance time - right;Decreased hip/knee flexion - right;Poor foot clearance - right;Lateral trunk lean to left    Ambulation Surface  Level;Indoor    Gait velocity  1 min 29 sec=0.34ms      Standardized Balance Assessment   Standardized Balance Assessment  Berg Balance Test      Berg Balance Test   Sit to Stand  Able to stand  independently using hands    Standing Unsupported  Able to stand 2 minutes with supervision    Sitting with Back Unsupported but Feet Supported on  Floor or Stool  Able to sit safely and securely 2 minutes    Stand to Sit  Controls descent by using hands    Transfers  Needs one person to assist    Standing Unsupported with Eyes Closed  Able to stand 10 seconds with supervision    Standing Ubsupported with Feet Together  Needs help to attain position and unable to hold for 15 seconds    From Standing, Reach Forward with Outstretched Arm  Can reach forward >5 cm safely (2")    From Standing Position, Pick up Object from Floor  Unable to try/needs assist to keep balance    From Standing Position, Turn to Look Behind Over each Shoulder  Needs supervision when turning    Turn 360 Degrees  Needs assistance while turning    Standing Unsupported, Alternately Place Feet on Step/Stool  Needs assistance to keep from falling or unable to try    Standing Unsupported, One Foot in Front  Loses balance while stepping or standing    Standing on One Leg  Unable to try or needs assist to prevent fall    Total Score  20             PT Education - 12/30/19 2006    Education Details  PT demonstrated donning of givmohr sling to new caregiver. Pt has givmore sling at home and advised to be using when up if she is going to continue using hemiwalker as she has. Issued written hand out with instructions on proper donning as well. Discussed recert plan.    Person(s) Educated  Patient;Caregiver(s)    Methods  Explanation;Demonstration;Handout    Comprehension  Verbalized understanding       PT Short Term Goals - 12/02/19 1743      PT SHORT TERM GOAL #1   Title  Pt will be able to perform all transfers CGA for improved mobility.    Baseline  Pt is able to perform transfers CGA with sit to stand and stand/pivot    Time  4    Period  Weeks    Status  Achieved    Target Date  12/02/19      PT SHORT TERM GOAL #2   Title  Pt will be able to  maintain standing with 1 UE support x 5 min to assist with standing ADLs supervision.    Baseline  Pt stood 5 min  at walker supervision    Time  4    Period  Weeks    Status  Achieved    Target Date  12/02/19      PT SHORT TERM GOAL #3   Title  Pt will ambulate >350' with min assist of 1 person with RW with right hand grip attachment so that husband can safely ambulate with her at home.    Baseline  230' with RW mod assist.    Time  4    Period  Weeks    Status  Partially Met    Target Date  12/02/19        PT Long Term Goals - 12/30/19 2011      PT LONG TERM GOAL #1   Title  Pt will be able to perform progressive HEP for strengthening, balance and mobility with assist of husband to continued gains at home.    Baseline  PT continues to add to HEP    Time  8    Period  Weeks    Status  On-going      PT LONG TERM GOAL #2   Title  Pt will decrease 5 x sit to stand from 27 sec to <22 sec from edge of mat for improved balance and functional mobility.    Baseline  22 sec on 12/30/19    Time  8    Period  Weeks    Status  Partially Met      PT LONG TERM GOAL #3   Title  Pt will ambulate >400' CGA with RW with right hand grip attachment for improved gait ability in home.    Baseline  230' with RW min/mod assist, 80' with hemiwalker CGA.    Time  8    Period  Weeks    Status  On-going      Updated PT goals: PT Short Term Goals - 12/30/19 2018      PT SHORT TERM GOAL #1   Title  Pt will decrease 5 x sit to stand from 22 sec to <20 sec from mat for improved balance and functional strength.    Baseline  22 sec on 12/30/19    Time  4    Period  Weeks    Status  New    Target Date  01/29/20      PT SHORT TERM GOAL #2   Title  Pt will ambulate with LRAD min assist >300' on level surfaces for improved household mobility with caregiver assist.    Time  4    Period  Weeks    Status  New    Target Date  01/29/20      PT Long Term Goals - 12/30/19 2021      PT LONG TERM GOAL #1   Title  Pt will be able to perform progressive HEP for strengthening, balance and mobility with assist of  husband to continued gains at home.    Baseline  PT continues to add to HEP    Time  8    Period  Weeks    Status  On-going    Target Date  02/28/20      PT LONG TERM GOAL #2   Title  Pt will increase Berg Balance from 20/56 to >26/ 56 for improved balance.    Baseline  20/56 on  12/30/19    Time  8    Period  Weeks    Status  New    Target Date  02/28/20      PT LONG TERM GOAL #3   Title  Pt will ambulate >400' CGA with LRAD  for improved gait ability in home.    Baseline  230' with RW min/mod assist, 1' with hemiwalker CGA.    Time  8    Period  Weeks    Status  Revised    Target Date  02/28/20      PT LONG TERM GOAL #4   Title  Pt will increase gait speed from 0.69ms to >0.36m for improved gait safety.    Baseline  0.1160mon 12/30/19    Time  8    Period  Weeks    Status  New    Target Date  02/28/20            Plan - 12/30/19 2012    Clinical Impression Statement  PT reassessed goals at visit today. Pt has continued to show progress with gait. PT has been focusing more on trying to get more return in right leg which is making gradualy progress but patient still reverts to more compensatory strategies to advance RLE. Pt has increased gait to 230' with RW min/mod assist and 11553ith hemiwalker CGA. Pt gets better right weight shift with walker but does require more assist. Pt decreased 5 x sit to stand to 22 sec just short of goal. This score along with Berg score of 20/56 indicate patient is still high fall risk. Pt is following commands better and showing less impulsivity overall.  Pt will continue to benefit from skilled PT to further progress strength, balance and gait to improve safety in home.    Personal Factors and Comorbidities  Comorbidity 3+    Comorbidities  anxiety, migraines, HTN    Examination-Activity Limitations  Bathing;Bed Mobility;Stand;Locomotion Level;Squat;Stairs;Dressing;Transfers    Examination-Participation Restrictions  Community  Activity;Driving;Shop;Laundry;Meal Prep    Stability/Clinical Decision Making  Evolving/Moderate complexity    Rehab Potential  Good    PT Frequency  2x / week    PT Duration  8 weeks    PT Treatment/Interventions  ADLs/Self Care Home Management;Electrical Stimulation;DME Instruction;Gait training;Stair training;Functional mobility training;Therapeutic activities;Therapeutic exercise;Orthotic Fit/Training;Patient/family education;Neuromuscular re-education;Balance training;Manual techniques;Passive range of motion;Vestibular;Wheelchair mobility training    PT Next Visit Plan  Danceskin to front of right shoe to help slide. Continue with activities in standing to facilitate increased right weight shift and R hip flexion/initiation of R swing phase, continue gait training with RW with right hand grip attachment as well as gait with hemiwalker with givmohr sling use.  Pt responded well to short bout on [employer] and may benefit for use to help wtih RLE strength, NMR.    Consulted and Agree with Plan of Care  Patient    Family Member Consulted  Caregiver       Patient will benefit from skilled therapeutic intervention in order to improve the following deficits and impairments:  Abnormal gait, Decreased activity tolerance, Decreased balance, Decreased cognition, Decreased knowledge of use of DME, Decreased mobility, Decreased range of motion, Decreased safety awareness, Decreased strength, Impaired sensation  Visit Diagnosis: Other abnormalities of gait and mobility  Muscle weakness (generalized)  Hemiplegia and hemiparesis following nontraumatic intracerebral hemorrhage affecting right dominant side ([facility]   Problem List Patient Active Problem List   Diagnosis Date Noted  . Adjustment disorder with mixed disturbance  of emotions and conduct 12/20/2019  . Nontraumatic subcortical hemorrhage of left cerebral hemisphere ([place]) 08/27/2019  . Acute blood loss anemia   . Hypoalbuminemia due to  protein-calorie malnutrition ([place])   . Thrombocytopenia ([place])   . Dysphagia 07/29/2019  . Infarction of left basal ganglia ([place]) 07/18/2019  . Hypernatremia   . Leukocytosis   . Essential hypertension   . Global aphasia   . Cytotoxic brain edema ([place]) 07/10/2019  . ICH (intracerebral hemorrhage) ([place]) 07/09/2019  . Anxiety state 02/21/2015  . Cephalalgia 12/21/2014    Electa Sniff, PT, DPT, NCS 12/30/2019, 8:18 PM  [facility] [address] [place], [place], [address] Phone: [phone]   Fax:  [phone]  Name: Melanie Madden MRN: [id number] Date of Birth: 01-17-1966

## 2020-01-01 ENCOUNTER — Ambulatory Visit: Payer: Managed Care, Other (non HMO)

## 2020-01-01 ENCOUNTER — Encounter: Payer: Self-pay | Admitting: Occupational Therapy

## 2020-01-01 ENCOUNTER — Other Ambulatory Visit: Payer: Self-pay

## 2020-01-01 ENCOUNTER — Ambulatory Visit: Payer: Managed Care, Other (non HMO) | Admitting: Occupational Therapy

## 2020-01-01 DIAGNOSIS — I69151 Hemiplegia and hemiparesis following nontraumatic intracerebral hemorrhage affecting right dominant side: Secondary | ICD-10-CM

## 2020-01-01 DIAGNOSIS — I69219 Unspecified symptoms and signs involving cognitive functions following other nontraumatic intracranial hemorrhage: Secondary | ICD-10-CM

## 2020-01-01 DIAGNOSIS — R2689 Other abnormalities of gait and mobility: Secondary | ICD-10-CM

## 2020-01-01 DIAGNOSIS — R208 Other disturbances of skin sensation: Secondary | ICD-10-CM

## 2020-01-01 DIAGNOSIS — M6281 Muscle weakness (generalized): Secondary | ICD-10-CM

## 2020-01-01 NOTE — Therapy (Signed)
[facility] [address] [place], [place], [address] Phone: [phone]   Fax:  [phone]  Physical Therapy Treatment  Patient Details  Name: Melanie Madden MRN: [id number] Date of Birth: Oct 23, 1965 Referring Provider (PT): Lauraine Rinne, [place]   Encounter Date: 01/01/2020  PT End of Session - 01/01/20 1023    Visit Number  31    Number of Visits  54    Date for PT Re-Evaluation  03/29/20    Authorization Type  [employer]- 60 VL combined PT/OT/ST - counts as 1 visit if seen on same day.  Not sure if 60 is hard max.  $50 copay each day - make sure PT/OT/ST on same day if possible    Authorization - Visit Number  --   PT/OT/ST days combined   Authorization - Number of Visits  60    PT Start Time  1017    PT Stop Time  1059    PT Time Calculation (min)  42 min    Equipment Utilized During Treatment  Gait belt   pt's R AFO; dance shoe cover to aid in foot clearance   Activity Tolerance  Patient tolerated treatment well    Behavior During Therapy  Impulsive       Past Medical History:  Diagnosis Date  . Allergy   . Anxiety   . Arthritis   . High triglycerides   . Kidney stones   . Migraines   . Recurrent sinus infections     Past Surgical History:  Procedure Laterality Date  . ANTERIOR CRUCIATE LIGAMENT REPAIR Left   . BUNIONECTOMY Right   . LASIK    . LITHOTRIPSY    . MENISCUS REPAIR Left   . NASAL SEPTUM SURGERY    . SHOULDER SURGERY    . VARICOSE VEIN SURGERY Left     There were no vitals filed for this visit.                     [facility] Adult PT Treatment/Exercise - 01/01/20 1056      Transfers   Transfers  Sit to Stand;Stand to Sit    Sit to Stand  5: Supervision;4: Min guard    Sit to Stand Details  Verbal cues for technique    Stand to Sit  5: Supervision;4: Min guard    Stand to Sit Details (indicate cue type and reason)  Verbal cues for technique    Stand to Sit  Details  Pt was cued to be sure to back up all the way prior to sitting. Pt has decreased eccentric control with lowering.      Ambulation/Gait   Ambulation/Gait  Yes    Ambulation/Gait Assistance  4: Min guard;4: Min assist    Ambulation/Gait Assistance Details  Pt ambulated in to [place] again with hemiwalker. Had it positioned in front of her but she did have givmohr sling donned upon arrival. PT cued to try to increase right weight shift. After sitting down in clinic PT adjusted givmorh sling as was on backwards to try to get in better position. Instructed caregiver and aide on proper position with blue lines going against skin. Pt then ambulated in clinic after PT cuing verbally and with demonstration to keep hemiwalker more to the side to allow for larger left step. Pt able to show some improvement with partial step past right foot. Continues to have left lateral trunk lean to assist with advancing right LE.  PT applied danceskin to front of right shoe for 2nd 2 bouts and also added in left heel lift to try to decrease some recurvatum that patient was getting. Helped some but not significant so removed at end of session. Pt did not feel any difference. May try again in future. PT assisted at right pelvis to try to get more hip flexion to attempt to decrease left lateral lean.    Ambulation Distance (Feet)  75 Feet   115' x 3   Assistive device  Hemi-walker   right givmohr sling   Gait Pattern  Step-to pattern;Step-through pattern;Decreased hip/knee flexion - right;Decreased stance time - right;Decreased weight shift to right;Lateral trunk lean to left    Ambulation Surface  Level;Indoor    Pre-Gait Activities  Standing at [employer] with mirror in front working on weight shifting to the right with PT blocking at right knee to try to stabilize x 10, stepping forward and back with left foot x 10 with cues to go slower to try to increase right weight shift, tapping 2" step with left foot x 10. CGA and  tactile cues to help weight shift throughout.             PT Education - 01/01/20 1154    Education Details  Education on proper positioning of givmohr sling    Person(s) Educated  Patient;Child(ren)    Methods  Explanation;Demonstration    Comprehension  Verbalized understanding       PT Short Term Goals - 12/30/19 2018      PT SHORT TERM GOAL #1   Title  Pt will decrease 5 x sit to stand from 22 sec to <20 sec from mat for improved balance and functional strength.    Baseline  22 sec on 12/30/19    Time  4    Period  Weeks    Status  New    Target Date  01/29/20      PT SHORT TERM GOAL #2   Title  Pt will ambulate with LRAD min assist >300' on level surfaces for improved household mobility with caregiver assist.    Time  4    Period  Weeks    Status  New    Target Date  01/29/20        PT Long Term Goals - 12/30/19 2021      PT LONG TERM GOAL #1   Title  Pt will be able to perform progressive HEP for strengthening, balance and mobility with assist of husband to continued gains at home.    Baseline  PT continues to add to HEP    Time  8    Period  Weeks    Status  On-going    Target Date  02/28/20      PT LONG TERM GOAL #2   Title  Pt will increase Berg Balance from 20/56 to >26/ 56 for improved balance.    Baseline  20/56 on 12/30/19    Time  8    Period  Weeks    Status  New    Target Date  02/28/20      PT LONG TERM GOAL #3   Title  Pt will ambulate >400' CGA with LRAD  for improved gait ability in home.    Baseline  230' with RW min/mod assist, 64' with hemiwalker CGA.    Time  8    Period  Weeks    Status  Revised    Target Date  02/28/20  PT LONG TERM GOAL #4   Title  Pt will increase gait speed from 0.39m/s to >0.57m/s for improved gait safety.    Baseline  0.32m/s on 12/30/19    Time  8    Period  Weeks    Status  New    Target Date  02/28/20            Plan - 01/01/20 1154    Clinical Impression Statement  PT focused more on gait  with hemiwalker trying to improve technique. Pt continues to follow commands better and was able to get hemiwalker more to the side. She continues to utilize strong left lateral lean to advance right leg.    Personal Factors and Comorbidities  Comorbidity 3+    Comorbidities  anxiety, migraines, HTN    Examination-Activity Limitations  Bathing;Bed Mobility;Stand;Locomotion Level;Squat;Stairs;Dressing;Transfers    Examination-Participation Restrictions  Community Activity;Driving;Shop;Laundry;Meal Prep    Stability/Clinical Decision Making  Evolving/Moderate complexity    Rehab Potential  Good    PT Frequency  2x / week    PT Duration  8 weeks    PT Treatment/Interventions  ADLs/Self Care Home Management;Electrical Stimulation;DME Instruction;Gait training;Stair training;Functional mobility training;Therapeutic activities;Therapeutic exercise;Orthotic Fit/Training;Patient/family education;Neuromuscular re-education;Balance training;Manual techniques;Passive range of motion;Vestibular;Wheelchair mobility training    PT Next Visit Plan  Danceskin to front of right shoe to help slide. Continue with activities in standing to facilitate increased right weight shift and R hip flexion/initiation of R swing phase, continue gait training with RW with right hand grip attachment as well as gait with hemiwalker with givmohr sling use.  Pt responded well to short bout on Sci[employer] and may benefit for use to help wtih RLE strength, NMR.    Consulted and Agree with Plan of Care  Patient    Family Member Consulted  Caregiver       Patient will benefit from skilled therapeutic intervention in order to improve the following deficits and impairments:  Abnormal gait, Decreased activity tolerance, Decreased balance, Decreased cognition, Decreased knowledge of use of DME, Decreased mobility, Decreased range of motion, Decreased safety awareness, Decreased strength, Impaired sensation  Visit Diagnosis: Other abnormalities  of gait and mobility  Muscle weakness (generalized)     Problem List Patient Active Problem List   Diagnosis Date Noted  . 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 Nontraumatic subcortical hemorrhage of left cerebral hemisphere ([facility] [place]) 08/27/2019  . Acute blood loss anemia   . Hypoalbuminemia due to protein-calorie malnutrition ([place])   . Thrombocytopenia ([place])   . Dysphagia 07/29/2019  . Infarction of left basal ganglia ([facility]) 07/18/2019  . Hypernatremia   . Leukocytosis   . Essential hypertension   . Global aphasia   . Cytotoxic brain edema ([facility]) 07/10/2019  . ICH (intracerebral hemorrhage) ([facility]) 07/09/2019  . Anxiety state 02/21/2015  . Cephalalgia 12/21/2014    Electa Sniff, PT, DPT, NCS 01/01/2020, 11:59 AM  [facility] [address] [place], [place], [address] Phone: [phone]   Fax:  [phone]  Name: Melanie Madden MRN: [id number] Date of Birth: 11-09-1965

## 2020-01-01 NOTE — Therapy (Signed)
[facility] [address] [place], [place], [address] Phone: [phone]   Fax:  [phone]  Occupational Therapy Treatment  Patient Details  Name: Melanie Madden MRN: [id number] Date of Birth: [birth year] Referring Provider (OT): Dr. Alysia Penna   Encounter Date: 01/01/2020  OT End of Session - 01/01/20 1042    Visit Number  28    Number of Visits  40    Date for OT Re-Evaluation  01/27/20    Authorization Type  [employer] 2021:  No prior auth OT, 60 DAY visit limit (regardless of how many therapies each day).    Authorization - Visit Number  83   32 combined   Authorization - Number of Visits  74    OT Start Time  1105    OT Stop Time  1145    OT Time Calculation (min)  40 min    Activity Tolerance  Patient tolerated treatment well    Behavior During Therapy  Impulsive       Past Medical History:  Diagnosis Date  . Allergy   . Anxiety   . Arthritis   . High triglycerides   . Kidney stones   . Migraines   . Recurrent sinus infections     Past Surgical History:  Procedure Laterality Date  . ANTERIOR CRUCIATE LIGAMENT REPAIR Left   . BUNIONECTOMY Right   . LASIK    . LITHOTRIPSY    . MENISCUS REPAIR Left   . NASAL SEPTUM SURGERY    . SHOULDER SURGERY    . VARICOSE VEIN SURGERY Left     There were no vitals filed for this visit.  Subjective Assessment - 01/01/20 1041    Subjective   pt denies pain    Patient is accompanied by:  --   aide   Pertinent History  Subcortical hemorrhage of R cerbral hemisphere.  PMH:  HTN, anxiety, migraines, hx of shoulder injection approx 2 weeks ago for R shoulder pain, hx of R shoulder surgery (arthroscopic)    Limitations  aphasia, fall risk, impulsive (husband reports hx of OCD)    Special Tests  PER HUSBAND for sit>stand:  pt responds to "big push" with knee blocked on both side    Patient Stated Goals  husband reports: to be able to care for  herself more (pt indicates that she onces to return to previous activities per questioning/gestures)    Currently in Pain?  No/denies           Sitting, wt. Bearing with bilateral hands in forward flex with mod A at elbow.  Wt. Bearing through R hand on mat in ER/abduction with body on arm movements with min cueing.  Sitting, Light wt. Bearing/AAROM shoulder flex/elbow ext with tilted stool with min facilitation today  AAROM shoulder flex horizontal abduction/adduction with with min-mod facilitation/cueing.  AAROM shoulder flex/elbow ext on diagonal surface with min-mod facilitation.  Prone, wt. Bearing on bilateral elbows with head/chest lift  for incr scapular depression/stabilization with mod facilitation/cueing.  Sitting, AAROM with large foam roll in elbow ext/scapular retraction and attempts for RUE to stabilize object with BUEs, needed min-mod facilitation.        OT Short Term Goals - 01/01/20 1230      OT SHORT TERM GOAL #1   Title  Pt/husband will be independent with initial HEP for RUE ROM--check STGs 10/14/19    Time  6    Period  Weeks    Status  Achieved      OT SHORT TERM GOAL #2   Title  Pt will perform UB dressing with min A.    Time  6    Period  Weeks    Status  Achieved   MOD ASSIST.  11/11/19:  pt able to don/doff zip-up fleece mod I.  11/17/19:  met per husband report     OT SHORT TERM GOAL #3   Title  Pt will perform UB bathing with mod A.    Time  6    Period  Weeks    Status  Achieved      OT SHORT TERM GOAL #4   Title  Pt will be able to brush hair with set-up.    Time  4    Period  Weeks    Status  On-going   fluctuating assist needed   Target Date  12/27/19      OT SHORT TERM GOAL #5   Title  Pt will demo at least 110* shoulder flex PROM in supine without pain for incr ease for ADLs.    Time  6    Period  Weeks    Status  Achieved      OT SHORT TERM GOAL #6   Title  Pt will be able to stabilze light object in [place] for low level  bilateral reach.--check updated STGs 12/27/19    Time  4    Period  Weeks    Status  New    Target Date  01/08/20   extend due to decr frequency due to scheduling     OT SHORT TERM GOAL #7   Title  Pt will be able to use RUE as a stablizer consistently for selected tasks mod I.    Time  4    Period  Weeks    Status  New    Target Date  01/08/20        OT Long Term Goals - 11/27/19 1205      OT LONG TERM GOAL #1   Title  Pt/husband will be independent with HEP for functional activities for incr participation in IADLs/leisure tasks (and incorporating RUE if able).--check updated LTGs 01/27/20    Time  8    Period  Weeks    Status  On-going      OT LONG TERM GOAL #2   Title  Pt will perform UB dressing with supervision.    Time  12    Period  Weeks    Status  Achieved   11/24/19     OT LONG TERM GOAL #3   Title  Pt will perform LB dressing with mod A.    Time  12    Period  Weeks    Status  Achieved   11/17/19:  min A per husband report     OT LONG TERM GOAL #4   Title  Pt will perform UB bathing with min A.    Time  12    Period  Weeks    Status  Achieved   11/17/19:  met per husband report     OT LONG TERM GOAL #5   Title  Pt will perform simple cold snack prep  with min A/set up.    Time  8    Period  Weeks    Status  Revised   11/24/19:  not performing, not using w/c in the home anymore     OT LONG TERM GOAL #6   Title  Pt will perform toileting with mod A.    Time  12    Period  Weeks    Status  Achieved   11/24/19:  CGA/close supervision for ambulation/standing per aide report     OT LONG TERM GOAL #7   Title  Pt will demo at least 20* active shoulder flex in prep for functional low-level reach.    Time  8    Period  Weeks    Status  New      OT LONG TERM GOAL #8   Title  Pt will be able to weight bear through RUE in standing at counter to assist with ADL/IADL tasks.    Time  8    Period  Weeks    Status  New            Plan - 01/01/20 1224     Clinical Impression Statement  Pt continues to be distracted at times during session which affects performance, but pt is incr consistency with AAROM/closed-chain activities today.    Occupational performance deficits (Please refer to evaluation for details):  ADL's;IADL's;Leisure;Work;Social Participation    Body Structure / Function / Physical Skills  ADL;ROM;IADL;Sensation;Mobility;Strength;Tone;UE functional use;Decreased knowledge of precautions;Decreased knowledge of use of DME;Coordination;Balance;FMC;GMC    Cognitive Skills  Attention;Temperament/Personality;Understand;Thought;Safety Awareness    OT Frequency  2x / week    OT Duration  8 weeks   may be modified d/t scheduling needs   OT Treatment/Interventions  Self-care/ADL training;Moist Heat;DME and/or AE instruction;Splinting;Therapeutic activities;Aquatic Therapy;Cognitive remediation/compensation;Therapeutic exercise;Cryotherapy;Neuromuscular education;Functional Mobility Training;Passive range of motion;Visual/perceptual remediation/compensation;Patient/family education;Manual Therapy;Electrical Stimulation;Fluidtherapy    Plan  retrieve items from refridgerator from w/c level, and work on sit to stand at counter to retrieve items from higher shelf if able; check STGs    Consulted and Agree with Plan of Care  Patient;Other (Comment)    Family Member Consulted  caregiver       Patient will benefit from skilled therapeutic intervention in order to improve the following deficits and impairments:   Body Structure / Function / Physical Skills: ADL, ROM, IADL, Sensation, Mobility, Strength, Tone, UE functional use, Decreased knowledge of precautions, Decreased knowledge of use of DME, Coordination, Balance, FMC, GMC Cognitive Skills: Attention, Temperament/Personality, Understand, Thought, Safety Awareness     Visit Diagnosis: Hemiplegia and hemiparesis following nontraumatic intracerebral hemorrhage affecting right dominant side  (HCC)  Other disturbances of skin sensation  Unspecified symptoms and signs involving cognitive functions following other nontraumatic intracranial hemorrhage  Other abnormalities of gait and mobility    Problem List Patient Active Problem List   Diagnosis Date Noted  . 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 Nontraumatic subcortical hemorrhage of left cerebral hemisphere ([facility]) 08/27/2019  . Acute blood loss anemia   . Hypoalbuminemia due to protein-calorie malnutrition ([facility])   . Thrombocytopenia ([place])   . Dysphagia 07/29/2019  . Infarction of left basal ganglia ([facility]) 07/18/2019  . Hypernatremia   . Leukocytosis   . Essential hypertension   . Global aphasia   . Cytotoxic brain edema ([place]) 07/10/2019  . ICH (intracerebral hemorrhage) ([place]) 07/09/2019  . Anxiety state 02/21/2015  . Cephalalgia 12/21/2014    [facility] 01/01/2020, 12:43 PM  [facility] [address] [place], [place], [address] Phone: [phone]   Fax:  [phone]  Name: Melanie Madden MRN: [id number] Date of Birth: 09/14/1965   Vianne Bulls, OTR/L [facility] [place],  [place]  [address] [phone] phone [phone] 01/01/20 12:43 PM

## 2020-01-04 ENCOUNTER — Encounter: Payer: Self-pay | Admitting: Speech Pathology

## 2020-01-04 ENCOUNTER — Ambulatory Visit: Payer: Managed Care, Other (non HMO)

## 2020-01-04 ENCOUNTER — Other Ambulatory Visit: Payer: Self-pay

## 2020-01-04 ENCOUNTER — Ambulatory Visit: Payer: Managed Care, Other (non HMO) | Admitting: Speech Pathology

## 2020-01-04 DIAGNOSIS — M6281 Muscle weakness (generalized): Secondary | ICD-10-CM

## 2020-01-04 DIAGNOSIS — R2689 Other abnormalities of gait and mobility: Secondary | ICD-10-CM | POA: Diagnosis not present

## 2020-01-04 DIAGNOSIS — R4701 Aphasia: Secondary | ICD-10-CM

## 2020-01-04 DIAGNOSIS — R41841 Cognitive communication deficit: Secondary | ICD-10-CM

## 2020-01-04 DIAGNOSIS — R482 Apraxia: Secondary | ICD-10-CM
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
## 2020-01-04 NOTE — Therapy (Addendum)
[facility] [address] [place], [place], [address] Phone: [phone]   Fax:  [phone]  Physical Therapy Treatment  Patient Details  Name: Melanie Madden MRN: [id number] Date of Birth: Nov 23, 1965 Referring Provider (PT): Lauraine Rinne, [place]   Encounter Date: 01/04/2020  PT End of Session - 01/04/20 1236    Visit Number  32    Number of Visits  54    Date for PT Re-Evaluation  03/29/20    Authorization Type  [employer]- 60 VL combined PT/OT/ST - counts as 1 visit if seen on same day.  Not sure if 60 is hard max.  $50 copay each day - make sure PT/OT/ST on same day if possible    Authorization - Visit Number  --   PT/OT/ST days combined   Authorization - Number of Visits  60    PT Start Time  1230    PT Stop Time  1315    PT Time Calculation (min)  45 min    Equipment Utilized During Treatment  Gait belt   pt's R AFO; dance shoe cover to aid in foot clearance   Activity Tolerance  Patient tolerated treatment well    Behavior During Therapy  Impulsive       Past Medical History:  Diagnosis Date  . Allergy   . Anxiety   . Arthritis   . High triglycerides   . Kidney stones   . Migraines   . Recurrent sinus infections     Past Surgical History:  Procedure Laterality Date  . ANTERIOR CRUCIATE LIGAMENT REPAIR Left   . BUNIONECTOMY Right   . LASIK    . LITHOTRIPSY    . MENISCUS REPAIR Left   . NASAL SEPTUM SURGERY    . SHOULDER SURGERY    . VARICOSE VEIN SURGERY Left     There were no vitals filed for this visit.  Subjective Assessment - 01/04/20 1236    Subjective  Pt feels walking going well. Denies any falls.    Patient is accompained by:  --   Caregivers   Pertinent History  anxiety, migraines, HTN    Patient Stated Goals  Per husband report they would like her to get stronger on right, improve mobility, and work on speech.    Currently in Pain?  No/denies                         [facility] Adult PT Treatment/Exercise - 01/04/20 1237      Transfers   Transfers  Sit to Stand;Stand to Sit    Sit to Stand  5: Supervision    Stand to Sit  5: Supervision      Ambulation/Gait   Ambulation/Gait  Yes    Ambulation/Gait Assistance  4: Min guard;4: Min assist    Ambulation/Gait Assistance Details  PT providing tactile assist at pelvis to try to get more right hip flexion. Verbal cues to decrease trunk lean to left. During 2nd bout PT provided tactile cues at left upper trunk and right hip to provide pt assist for alignment with upright posture. Pt was able to demonstrate less left trunk lean.    Ambulation Distance (Feet)  55 Feet   230' x 2   Assistive device  Hemi-walker   right AFO   Gait Pattern  Step-to pattern;Step-through pattern;Decreased stance time - right;Decreased hip/knee flexion - right;Lateral trunk lean to left    Ambulation Surface  Level;Indoor    Gait Comments  PT adjusted givmohr sling prior to gait to better support right arm.      Neuro Re-ed    Neuro Re-ed Details   Supine: right hip flex/ext with leg on red physioball 10 x 2 with PT stabilizing leg, Min assist to bring in to flexion and verbal cues to push back down. Bridges 10 x 2 with PT stabilizing  right knee, hooklying having pt try to maintain right leg in neutral and then bringing in and out. No control in to ER but was able to IR some. Right SAQ 6 x 2 with PT tapping muscle to try to facilitate min/mod assist. Pt able to get trace to 2-/5 contraction at times. Right hip abd/adduction max assist especially in to abd. Pt's aide observing throughout and will work on exercises with patient at home.               PT Short Term Goals - 12/30/19 2018      PT SHORT TERM GOAL #1   Title  Pt will decrease 5 x sit to stand from 22 sec to <20 sec from mat for improved balance and functional strength.    Baseline  22 sec on 12/30/19    Time  4    Period  Weeks     Status  New    Target Date  01/29/20      PT SHORT TERM GOAL #2   Title  Pt will ambulate with LRAD min assist >300' on level surfaces for improved household mobility with caregiver assist.    Time  4    Period  Weeks    Status  New    Target Date  01/29/20        PT Long Term Goals - 12/30/19 2021      PT LONG TERM GOAL #1   Title  Pt will be able to perform progressive HEP for strengthening, balance and mobility with assist of husband to continued gains at home.    Baseline  PT continues to add to HEP    Time  8    Period  Weeks    Status  On-going    Target Date  02/28/20      PT LONG TERM GOAL #2   Title  Pt will increase Berg Balance from 20/56 to >26/ 56 for improved balance.    Baseline  20/56 on 12/30/19    Time  8    Period  Weeks    Status  New    Target Date  02/28/20      PT LONG TERM GOAL #3   Title  Pt will ambulate >400' CGA with LRAD  for improved gait ability in home.    Baseline  230' with RW min/mod assist, 90' with hemiwalker CGA.    Time  8    Period  Weeks    Status  Revised    Target Date  02/28/20      PT LONG TERM GOAL #4   Title  Pt will increase gait speed from 0.50m/s to >0.73m/s for improved gait safety.    Baseline  0.15m/s on 12/30/19    Time  8    Period  Weeks    Status  New    Target Date  02/28/20          01/06/20 1237  Plan  Clinical Impression Statement Pt was able to show improved upright posture with gait when PT gave  tactile cues at trunk and hip more as guidelines to try not to lean in to. Pt continues to show improvement in following cuing.  Personal Factors and Comorbidities Comorbidity 3+  Comorbidities anxiety, migraines, HTN  Examination-Activity Limitations Bathing;Bed Mobility;Stand;Locomotion Level;Squat;Stairs;Dressing;Transfers  Examination-Participation Restrictions Community Activity;Driving;Shop;Laundry;Meal Prep  Pt will benefit from skilled therapeutic intervention in order to improve on the following  deficits Abnormal gait;Decreased activity tolerance;Decreased balance;Decreased cognition;Decreased knowledge of use of DME;Decreased mobility;Decreased range of motion;Decreased safety awareness;Decreased strength;Impaired sensation  Stability/Clinical Decision Making Evolving/Moderate complexity  Rehab Potential Good  PT Frequency 2x / week  PT Duration 8 weeks  PT Treatment/Interventions ADLs/Self Care Home Management;Electrical Stimulation;DME Instruction;Gait training;Stair training;Functional mobility training;Therapeutic activities;Therapeutic exercise;Orthotic Fit/Training;Patient/family education;Neuromuscular re-education;Balance training;Manual techniques;Passive range of motion;Vestibular;Wheelchair mobility training  PT Next Visit Plan [place] to front of right shoe to help slide. Continue with activities in standing to facilitate increased right weight shift and R hip flexion/initiation of R swing phase, continue gait training with RW with right hand grip attachment as well as gait with hemiwalker with givmohr sling use.  Pt responded well to short bout on SciFit and may benefit for use to help wtih RLE strength, NMR.  Consulted and Agree with Plan of Care Patient  Family Member Consulted Caregiver         Patient will benefit from skilled therapeutic intervention in order to improve the following deficits and impairments:     Visit Diagnosis: Other abnormalities of gait and mobility  Muscle weakness (generalized)     Problem List Patient Active Problem List   Diagnosis Date Noted  . 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 Nontraumatic subcortical hemorrhage of left cerebral hemisphere ([place]) 08/27/2019  . Acute blood loss anemia   . Hypoalbuminemia due to protein-calorie malnutrition ([place])   . Thrombocytopenia ([place])   . Dysphagia 07/29/2019  . Infarction of left basal ganglia ([place]) 07/18/2019  . Hypernatremia   .  Leukocytosis   . Essential hypertension   . Global aphasia   . Cytotoxic brain edema ([place]) 07/10/2019  . ICH (intracerebral hemorrhage) ([place]) 07/09/2019  . Anxiety state 02/21/2015  . Cephalalgia 12/21/2014    Electa Sniff, PT, DPT, NCS 01/04/2020, 5:54 PM  [facility] [address] [place], [place], [address] Phone: [phone]   Fax:  [phone]  Name: Melanie Madden MRN: [id number] Date of Birth: 08-11-1965

## 2020-01-04 NOTE — Therapy (Signed)
[facility] [address] [place], [place], [address] Phone: [phone]   Fax:  [phone]  Speech Language Pathology Treatment  Patient Details  Name: Melanie Mcguirk Da Glennie Madden MRN: [id number] Date of Birth: 1965/09/10 Referring Provider (SLP): Alysia Penna, MD   Encounter Date: 01/04/2020  End of Session - 01/04/20 1421    Visit Number  28    Number of Visits  41    Date for SLP Re-Evaluation  02/26/20    Authorization Time Period  60 DAYS of therapy regardless of number disciplines per day    SLP Start Time  9233    SLP Stop Time   1400    SLP Time Calculation (min)  43 min    Activity Tolerance  Patient tolerated treatment well       Past Medical History:  Diagnosis Date  . Allergy   . Anxiety   . Arthritis   . High triglycerides   . Kidney stones   . Migraines   . Recurrent sinus infections     Past Surgical History:  Procedure Laterality Date  . ANTERIOR CRUCIATE LIGAMENT REPAIR Left   . BUNIONECTOMY Right   . LASIK    . LITHOTRIPSY    . MENISCUS REPAIR Left   . NASAL SEPTUM SURGERY    . SHOULDER SURGERY    . VARICOSE VEIN SURGERY Left     There were no vitals filed for this visit.  Subjective Assessment - 01/04/20 1352    Subjective  "Da Da Da"    Patient is accompained by:  --   aide Quesha   Currently in Pain?  No/denies            ADULT SLP TREATMENT - 01/04/20 1405      General Information   Behavior/Cognition  Alert;Cooperative;Impulsive;Requires cueing      Treatment Provided   Treatment provided  Cognitive-Linquistic      Cognitive-Linquistic Treatment   Treatment focused on  Aphasia;Apraxia;Patient/family/caregiver education    Skilled Treatment  Targeted written expression on Touch Talk (AAC device). Aries chose typing over writing. She copied 3 persoanlly relevant words (coffee, yoga, sing)  with extended time and cues for backspace when errors noted.   When difficulty increased to fill in blank letter, [name] required max A with frustration. Vaniyah spontaneously took Touch Talk and found "bringn in birdfeeders" under things I need help with. With written cues and gestures by ST, Eloyse indicated with yes/no which birds she has in her back yard. [employer] paired with written & spoken word, Jossalyn accurately indicated yes or no, With the same cues, Florentina indicated which animals lived in her back yard. Written cues and verbal cues to ID 3 activities Ivet enjoys. She used Touch Talk to ID activity she wants to work on, locating [employer]. She continues to required max A for nasal bilabial. I explained to aide, if Bexlee is practicing the sounds wrong at home, perseverating on voice alveolar stop, this is not beneficial.       Assessment / Recommendations / Plan   Plan  Continue with current plan of care      Progression Toward Goals   Progression toward goals  Progressing toward goals       SLP Education - 01/04/20 1416    Education Details  continue to add relevant icons/photos to device    Person(s) Educated  Patient;Caregiver(s)    Methods  Explanation;Demonstration;Verbal cues    Comprehension  Verbal cues required;Need further instruction       SLP Short Term Goals - 01/04/20 1420      SLP SHORT TERM GOAL #1   Title  pt will simultaneously with SLP produce 3 different phonemes 50% of the time over three sessions    Status  Partially Met      SLP SHORT TERM GOAL #2   Title  pt will ID objects salient to pt in f:4 80% of the time over 3 sessions    Status  Deferred      SLP SHORT TERM GOAL #3   Title  pt will write her full name on first attempt over three sessions    Time  1    Status  Deferred      SLP SHORT TERM GOAL #4   Title  pt will attempt expressive communication via multimodal means during 5 sessions    Status  Not Met      SLP SHORT TERM GOAL #5   Title  pt will demonstrate undertanding of simple  1-step functional commands with 70% accuracy with multimodal cues over 3 sessions    Status  Not Met      SLP SHORT TERM GOAL #6   Title  pt will look at object or line drawing and match the correct word from f:6 words 90% success over 3 sessions    Status  Deferred      SLP SHORT TERM GOAL #7   Title  pt will demo knowledge of pertinent /functional information placement on speech generating device 75% with rare min A    Time  4    Period  Weeks    Status  On-going      SLP SHORT TERM GOAL #8   Title  pt will demonstrate how to generate a new icon with written cues for procedure (max A for spelling) x3 sessions    Time  4    Period  Weeks    Status  New      SLP SHORT TERM GOAL  #9   TITLE  pt will communicate simple information (places, people, food, etc) using Lingraphica with occasional min a for navigation x3 sessions    Time  4    Period  Weeks    Status  On-going       SLP Long Term Goals - 01/04/20 1420      SLP LONG TERM GOAL #1   Title  pt's diet will be appropriately upgraded as needed by SLP    Period  --   or 25 total visits, for all LTGs   Status  Deferred   pt eating preferred diet without overt s/sx aspiration PNA     SLP LONG TERM GOAL #2   Title  pt will produce 3 simple words/words pertinent to pt (family or pet names, etc) simultaneously with SLP and multimodal cues over three sessions    Status  Not Met      SLP LONG TERM GOAL #3   Title  pt will demo understanding of simple yes/no questions pertinent to desires/wants with multimodal cues 90% of the time over 3 sessions    Status  Not Met      SLP LONG TERM GOAL #4   Title  a device trial with a speech generating device will be initiated within the first 20 ST visits    Status  Achieved      SLP LONG TERM GOAL #5   Title  pt will demo recognition of written word for a common object 100% success over 3 sessions    Baseline  11-13-19, 11-20-19    Status  Achieved      SLP LONG TERM GOAL #6   Title   pt will work on speech-related tasks at home between 75% of sessions as reported by family and/or noted on [employer] website    Baseline  10-21-19, 10-26-19, 10-28-19, 11-11-19, 11-13-19, 11-20-19, 11-24-19, 11-27-19    Status  Partially Met      SLP LONG TERM GOAL #7   Title  pt will demo knowledge of specific pertinent /functional information placement on speech generating device 90%    Time  6    Period  Weeks   or 41 total sessions, for all LTGs   Status  On-going      SLP LONG TERM GOAL #8   Title  pt will demonstrate how to generate a new icon with occasional min A for procedure (max A for spelling) x3 sessions    Time  6    Period  Weeks    Status  On-going      SLP LONG TERM GOAL  #9   TITLE  pt will communicate feelings or other personal mod complex information using Lingraphica with rare min a for navigation x3 sessions    Time  6    Period  Weeks    Status  On-going       Plan - 01/04/20 1417    Clinical Impression Statement  Pt expressive language and speech cont non functional. She continues to demonstrate deficits of attention, awareness, impulsivity. Severe aphasia and verbal apraxia. Ongoing training in AAC device and multimodal communication. Low frustration tolerance affects progress in ST. Continue skilled ST to maximize communication of wants/needs and to reduce caregiver burden    Speech Therapy Frequency  2x / week    Duration  --   8 weeks or 17 visits   Treatment/Interventions  Diet toleration management by SLP;Trials of upgraded texture/liquids;Language facilitation;Internal/external aids;Cognitive reorganization;Cueing hierarchy;SLP instruction and feedback;Patient/family education;Compensatory strategies;Multimodal communcation approach;Functional tasks;Environmental controls    Potential to Achieve Goals  Fair    Potential Considerations  Severity of impairments;Cooperation/participation level       Patient will benefit from skilled therapeutic  intervention in order to improve the following deficits and impairments:   Aphasia  Cognitive communication deficit  Verbal apraxia    Problem List Patient Active Problem List   Diagnosis Date Noted  . 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 Nontraumatic subcortical hemorrhage of left cerebral hemisphere ([facility]) 08/27/2019  . Acute blood loss anemia   . Hypoalbuminemia due to protein-calorie malnutrition ([facility])   . Thrombocytopenia ([facility])   . Dysphagia 07/29/2019  . Infarction of left basal ganglia ([facility]) 07/18/2019  . Hypernatremia   . Leukocytosis   . Essential hypertension   . Global aphasia   . Cytotoxic brain edema ([facility]) 07/10/2019  . ICH (intracerebral hemorrhage) ([facility]) 07/09/2019  . Anxiety state 02/21/2015  . Cephalalgia 12/21/2014    Zachary Nole, Annye Rusk MS, CCC-SLP 01/04/2020, 2:22 PM  [facility] [address] [place], [place], [address] Phone: [phone]   Fax:  [phone]   Name: Iyannah Blake Da Glennie Madden MRN: [id number] Date of Birth: 1965/12/03

## 2020-01-04 NOTE — SANE Note (Signed)
Follow-up Phone Call  Patient gives verbal consent for a FNE/SANE follow-up phone call in 48-72 hours: DID NOT ASK THE PT. Patient's telephone number: [phone] (SIGNIFICANT OTHER'S [name]'S CELL NUMBER; THE PT HAS ASPHASIA AND IS UNABLE TO SPEAK WELL.) Patient gives verbal consent to leave voicemail at the phone number listed above: DID NOT ASK THE PT. DO NOT CALL between the hours of: N/A

## 2020-01-06 ENCOUNTER — Ambulatory Visit: Payer: Managed Care, Other (non HMO) | Admitting: Speech Pathology

## 2020-01-06 ENCOUNTER — Other Ambulatory Visit: Payer: Self-pay

## 2020-01-06 ENCOUNTER — Ambulatory Visit: Payer: Managed Care, Other (non HMO)

## 2020-01-06 ENCOUNTER — Encounter: Payer: Self-pay | Admitting: Occupational Therapy

## 2020-01-06 ENCOUNTER — Ambulatory Visit: Payer: Managed Care, Other (non HMO) | Admitting: Occupational Therapy

## 2020-01-06 ENCOUNTER — Encounter: Payer: Self-pay | Admitting: Speech Pathology

## 2020-01-06 DIAGNOSIS — R2689 Other abnormalities of gait and mobility: Secondary | ICD-10-CM

## 2020-01-06 DIAGNOSIS — R41841 Cognitive communication deficit: Secondary | ICD-10-CM

## 2020-01-06 DIAGNOSIS — I69219 Unspecified symptoms and signs involving cognitive functions following other nontraumatic intracranial hemorrhage: Secondary | ICD-10-CM

## 2020-01-06 DIAGNOSIS — I69151 Hemiplegia and hemiparesis following nontraumatic intracerebral hemorrhage affecting right dominant side: Secondary | ICD-10-CM

## 2020-01-06 DIAGNOSIS — R482 Apraxia: Secondary | ICD-10-CM

## 2020-01-06 DIAGNOSIS — R208 Other disturbances of skin sensation: Secondary | ICD-10-CM

## 2020-01-06 DIAGNOSIS — R4701 Aphasia: Secondary | ICD-10-CM

## 2020-01-06 DIAGNOSIS — M6281 Muscle weakness (generalized): Secondary | ICD-10-CM

## 2020-01-06 NOTE — Therapy (Signed)
[facility] [address] [place], [place], [address] Phone: [phone]   Fax:  [phone]  Speech Language Pathology Treatment  Patient Details  Name: Melanie Madden Da Glennie Hawk MRN: [id number] Date of Birth: 07-Jul-1966 Referring Provider (SLP): Alysia Penna, MD   Encounter Date: 01/06/2020  End of Session - 01/06/20 1523    Visit Number  29    Number of Visits  41    Date for SLP Re-Evaluation  02/26/20    Authorization Type  [employer]ign[employer]    Authorization Time Period  75 DAYS of therapy regardless of number disciplines per day    Authorization - Visit Number  69    Authorization - Number of Visits  39    SLP Start Time  2836    SLP Stop Time   1357    SLP Time Calculation (min)  39 min       Past Medical History:  Diagnosis Date  . Allergy   . Anxiety   . Arthritis   . High triglycerides   . Kidney stones   . Migraines   . Recurrent sinus infections     Past Surgical History:  Procedure Laterality Date  . ANTERIOR CRUCIATE LIGAMENT REPAIR Left   . BUNIONECTOMY Right   . LASIK    . LITHOTRIPSY    . MENISCUS REPAIR Left   . NASAL SEPTUM SURGERY    . SHOULDER SURGERY    . VARICOSE VEIN SURGERY Left     There were no vitals filed for this visit.  Subjective Assessment - 01/06/20 1514    Subjective  Points to chair in front of mirror, gestures confusion as I moved the chair for this session    Patient is accompained by:  --   aide waited in the hall   Currently in Pain?  No/denies            ADULT SLP TREATMENT - 01/06/20 1515      General Information   Behavior/Cognition  Alert;Cooperative;Impulsive;Requires cueing      Treatment Provided   Treatment provided  Cognitive-Linquistic      Cognitive-Linquistic Treatment   Treatment focused on  Aphasia;Apraxia;Patient/family/caregiver education    Skilled Treatment  Last session, pt indicated that she wanted to work of speech  production. She independently locates the speech practice provided in the therapy activities icon/folder on her device. She attempts to produce the phonemes, however has not visual feedback and perseverates on /da/ for each phoneme presented. Per pt request we targeted cv syllables with /m/ and /t/ in isolation, and voiceless "who"/blow with success with frequent mod to max verbal cues and modeling. Specific feedback re: placement of her articulators and cues for her to listen to the sound and judge if it is correct or not. Vidoes of me producing phonemes in hierarchy from isolation to cv syllables on her Touch Talk. Taler demonstrated how to locate them and play them with rare min A. She required frequent mod A to make phonemes accurately with the videos. Reduced frustration working on speech today. Joanell Rising who waited outside of the room was instructed to stop Choua when she was making the sound incorrectly and model it for her      Assessment / Recommendations / Plan   Plan  Continue with current plan of care      Progression Toward Goals   Progression toward goals  Progressing toward goals       SLP Education -  01/06/20 1521    Education Details  go slow and use mirror when you are practicing with the videos    Person(s) Educated  Patient    Methods  Explanation;Verbal cues;Demonstration    Comprehension  Returned demonstration;Verbal cues required;Need further instruction       SLP Short Term Goals - 01/06/20 1523      SLP SHORT TERM GOAL #1   Title  pt will simultaneously with SLP produce 3 different phonemes 50% of the time over three sessions    Status  Partially Met      SLP [place] #2   Title  pt will ID objects salient to pt in f:4 80% of the time over 3 sessions    Status  Deferred      SLP SHORT TERM GOAL #3   Title  pt will write her full name on first attempt over three sessions    Time  1    Status  Deferred      SLP SHORT TERM GOAL #4   Title  pt will  attempt expressive communication via multimodal means during 5 sessions    Status  Not Met      SLP SHORT TERM GOAL #5   Title  pt will demonstrate undertanding of simple 1-step functional commands with 70% accuracy with multimodal cues over 3 sessions    Status  Not Met      SLP SHORT TERM GOAL #6   Title  pt will look at object or line drawing and match the correct word from f:6 words 90% success over 3 sessions    Status  Deferred      SLP SHORT TERM GOAL #7   Title  pt will demo knowledge of pertinent /functional information placement on speech generating device 75% with rare min A    Time  4    Period  Weeks    Status  On-going      SLP SHORT TERM GOAL #8   Title  pt will demonstrate how to generate a new icon with written cues for procedure (max A for spelling) x3 sessions    Time  4    Period  Weeks    Status  New      SLP SHORT TERM GOAL  #9   TITLE  pt will communicate simple information (places, people, food, etc) using Lingraphica with occasional min a for navigation x3 sessions    Time  4    Period  Weeks    Status  On-going       SLP Long Term Goals - 01/06/20 1523      SLP LONG TERM GOAL #1   Title  pt's diet will be appropriately upgraded as needed by SLP    Period  --   or 25 total visits, for all LTGs   Status  Deferred   pt eating preferred diet without overt s/sx aspiration PNA     SLP LONG TERM GOAL #2   Title  pt will produce 3 simple words/words pertinent to pt (family or pet names, etc) simultaneously with SLP and multimodal cues over three sessions    Status  Not Met      SLP LONG TERM GOAL #3   Title  pt will demo understanding of simple yes/no questions pertinent to desires/wants with multimodal cues 90% of the time over 3 sessions    Status  Not Met      SLP LONG TERM GOAL #4  Title  a device trial with a speech generating device will be initiated within the first 20 ST visits    Status  Achieved      SLP LONG TERM GOAL #5   Title  pt  will demo recognition of written word for a common object 100% success over 3 sessions    Baseline  11-13-19, 11-20-19    Status  Achieved      SLP LONG TERM GOAL #6   Title  pt will work on speech-related tasks at home between 75% of sessions as reported by family and/or noted on [employer] website    Baseline  10-21-19, 10-26-19, 10-28-19, 11-11-19, 11-13-19, 11-20-19, 11-24-19, 11-27-19    Status  Partially Met      SLP LONG TERM GOAL #7   Title  pt will demo knowledge of specific pertinent /functional information placement on speech generating device 90%    Time  6    Period  Weeks   or 41 total sessions, for all LTGs   Status  On-going      SLP LONG TERM GOAL #8   Title  pt will demonstrate how to generate a new icon with occasional min A for procedure (max A for spelling) x3 sessions    Time  6    Period  Weeks    Status  On-going      SLP LONG TERM GOAL  #9   TITLE  pt will communicate feelings or other personal mod complex information using Lingraphica with rare min a for navigation x3 sessions    Time  6    Period  Weeks    Status  On-going       Plan - 01/06/20 1522    Clinical Impression Statement  Pt expressive language and speech cont non functional. She continues to demonstrate deficits of attention, awareness, impulsivity. Severe aphasia and verbal apraxia. Ongoing training in AAC device and multimodal communication. Low frustration tolerance affects progress in ST. Continue skilled ST to maximize communication of wants/needs and to reduce caregiver burden    Speech Therapy Frequency  2x / week    Duration  --   16 weeks or 34 visits   Treatment/Interventions  Diet toleration management by SLP;Trials of upgraded texture/liquids;Language facilitation;Internal/external aids;Cognitive reorganization;Cueing hierarchy;SLP instruction and feedback;Patient/family education;Compensatory strategies;Multimodal communcation approach;Functional tasks;Environmental controls     Potential to Achieve Goals  Fair    Potential Considerations  Severity of impairments;Cooperation/participation level       Patient will benefit from skilled therapeutic intervention in order to improve the following deficits and impairments:   Aphasia  Cognitive communication deficit  Verbal apraxia    Problem List Patient Active Problem List   Diagnosis Date Noted  . 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 Nontraumatic subcortical hemorrhage of left cerebral hemisphere () 08/27/2019  . Acute blood loss anemia   . Hypoalbuminemia due to protein-calorie malnutrition ([facility])   . Thrombocytopenia ([facility])   . Dysphagia 07/29/2019  . Infarction of left basal ganglia ([facility]) 07/18/2019  . Hypernatremia   . Leukocytosis   . Essential hypertension   . Global aphasia   . Cytotoxic brain edema ([facility]) 07/10/2019  . ICH (intracerebral hemorrhage) ([facility]) 07/09/2019  . Anxiety state 02/21/2015  . Cephalalgia 12/21/2014    Estephani Popper, Annye Rusk MS, CCC-SLP 01/06/2020, 3:24 PM  [facility] [address] [place], [place], [address] Phone: [phone]   Fax:  [phone]  Name: Arriah Wadle Da Glennie Hawk MRN: [id number] Date of Birth: [birth year]

## 2020-01-06 NOTE — Therapy (Signed)
[facility] [address] [place], [place], [address] Phone: [phone]   Fax:  [phone]  Physical Therapy Treatment  Patient Details  Name: Melanie Madden MRN: [id number] Date of Birth: March 03, 1966 Referring Provider (PT): Lauraine Rinne, [place]   Encounter Date: 01/06/2020  PT End of Session - 01/06/20 1405    Visit Number  33    Number of Visits  54    Date for PT Re-Evaluation  03/29/20    Authorization Type  [employer]- 60 VL combined PT/OT/ST - counts as 1 visit if seen on same day.  Not sure if 60 is hard max.  $50 copay each day - make sure PT/OT/ST on same day if possible    Authorization - Visit Number  --   PT/OT/ST days combined   Authorization - Number of Visits  60    PT Start Time  1403    PT Stop Time  1445    PT Time Calculation (min)  42 min    Equipment Utilized During Treatment  Gait belt   pt's R AFO; dance shoe cover to aid in foot clearance   Activity Tolerance  Patient tolerated treatment well    Behavior During Therapy  Impulsive       Past Medical History:  Diagnosis Date  . Allergy   . Anxiety   . Arthritis   . High triglycerides   . Kidney stones   . Migraines   . Recurrent sinus infections     Past Surgical History:  Procedure Laterality Date  . ANTERIOR CRUCIATE LIGAMENT REPAIR Left   . BUNIONECTOMY Right   . LASIK    . LITHOTRIPSY    . MENISCUS REPAIR Left   . NASAL SEPTUM SURGERY    . SHOULDER SURGERY    . VARICOSE VEIN SURGERY Left     There were no vitals filed for this visit.  Subjective Assessment - 01/06/20 1405    Subjective  Pt just finished other therapies. Denies any new issues.    Patient is accompained by:  --   Caregivers   Pertinent History  anxiety, migraines, HTN    Patient Stated Goals  Per husband report they would like her to get stronger on right, improve mobility, and work on speech.    Currently in Pain?  No/denies                         [facility] Adult PT Treatment/Exercise - 01/06/20 1406      Transfers   Transfers  Sit to Stand;Stand to Sit    Sit to Stand  5: Supervision    Stand to Sit  5: Supervision    Stand to Sit Details (indicate cue type and reason)  Verbal cues for technique    Stand to Sit Details  Pt was cued to turn hemiwalker and back up prior to sitting versus moving to left side and stepping past it to turn.      Ambulation/Gait   Ambulation/Gait  Yes    Ambulation/Gait Assistance  4: Min guard    Ambulation/Gait Assistance Details  PT provided tactile cues at left upper trunk and right hip to help with more upright posture. Pt was cued to stand more erect and try not to lean as much to left to advance right leg. Pt able to improve some as went on. Has most lean when turning to left. Pt was able to decrease  circumduction on right some. Does have some recurvatum noted at right stance. Pt taking larger left step as she has been instructed.     Ambulation Distance (Feet)  230 Feet   230' x 1, 75' x 1   Assistive device  Hemi-walker   right AFO, danceskin on front of right shoe   Gait Pattern  Step-through pattern;Decreased hip/knee flexion - right;Decreased stance time - right;Right genu recurvatum;Lateral trunk lean to left;Poor foot clearance - right    Ambulation Surface  Level;Indoor      Neuro Re-ed    Neuro Re-ed Details   Standing in front of mirror with hemiwalker: kicking purple ball with right leg x 10 with PT helping to faciliate at pelvis, stepping foward and back with right leg with barrier on right to try to keep from circumducting x 10 with PT providing min assist at right leg. Stepping forward and back with LLE to increased right weight shift 10 x 2 with PT stabilizing at right knee and cuing patient to slow down to try to control movement more as well as weight shift.       Exercises   Exercises  Other Exercises      Knee/Hip Exercises: Aerobic   Other  Aerobic  SciFit x 6 min level 1.5 with PT assisting to keep right hip in neutral and verbal cues to push all the way straight               PT Short Term Goals - 12/30/19 2018      PT SHORT TERM GOAL #1   Title  Pt will decrease 5 x sit to stand from 22 sec to <20 sec from mat for improved balance and functional strength.    Baseline  22 sec on 12/30/19    Time  4    Period  Weeks    Status  New    Target Date  01/29/20      PT SHORT TERM GOAL #2   Title  Pt will ambulate with LRAD min assist >300' on level surfaces for improved household mobility with caregiver assist.    Time  4    Period  Weeks    Status  New    Target Date  01/29/20        PT Long Term Goals - 12/30/19 2021      PT LONG TERM GOAL #1   Title  Pt will be able to perform progressive HEP for strengthening, balance and mobility with assist of husband to continued gains at home.    Baseline  PT continues to add to HEP    Time  8    Period  Weeks    Status  On-going    Target Date  02/28/20      PT LONG TERM GOAL #2   Title  Pt will increase Berg Balance from 20/56 to >26/ 56 for improved balance.    Baseline  20/56 on 12/30/19    Time  8    Period  Weeks    Status  New    Target Date  02/28/20      PT LONG TERM GOAL #3   Title  Pt will ambulate >400' CGA with LRAD  for improved gait ability in home.    Baseline  230' with RW min/mod assist, 49' with hemiwalker CGA.    Time  8    Period  Weeks    Status  Revised    Target Date  02/28/20      PT LONG TERM GOAL #4   Title  Pt will increase gait speed from 0.22m/s to >0.48m/s for improved gait safety.    Baseline  0.41m/s on 12/30/19    Time  8    Period  Weeks    Status  New    Target Date  02/28/20            Plan - 01/06/20 1744    Clinical Impression Statement  PT continued to focus on improving posture with gait utilizing tactile cues and visual cues with mirror throughout session with activities. Pt continues to respond better to  cues and showing some improvement in posture and less circumduction at right hip.    Personal Factors and Comorbidities  Comorbidity 3+    Comorbidities  anxiety, migraines, HTN    Examination-Activity Limitations  Bathing;Bed Mobility;Stand;Locomotion Level;Squat;Stairs;Dressing;Transfers    Examination-Participation Restrictions  Community Activity;Driving;Shop;Laundry;Meal Prep    Stability/Clinical Decision Making  Evolving/Moderate complexity    Rehab Potential  Good    PT Frequency  2x / week    PT Duration  8 weeks    PT Treatment/Interventions  ADLs/Self Care Home Management;Electrical Stimulation;DME Instruction;Gait training;Stair training;Functional mobility training;Therapeutic activities;Therapeutic exercise;Orthotic Fit/Training;Patient/family education;Neuromuscular re-education;Balance training;Manual techniques;Passive range of motion;Vestibular;Wheelchair mobility training    PT Next Visit Plan  Check right DF range to see if contributing to recurvatum. Danceskin to front of right shoe to help slide. Continue with activities in standing to facilitate increased right weight shift and R hip flexion/initiation of R swing phase, continue gait training with RW with right hand grip attachment as well as gait with hemiwalker with givmohr sling use.  Pt responded well to short bout on SciFit and may benefit for use to help wtih RLE strength, NMR.    Consulted and Agree with Plan of Care  Patient    Family Member Consulted  Caregiver       Patient will benefit from skilled therapeutic intervention in order to improve the following deficits and impairments:  Abnormal gait, Decreased activity tolerance, Decreased balance, Decreased cognition, Decreased knowledge of use of DME, Decreased mobility, Decreased range of motion, Decreased safety awareness, Decreased strength, Impaired sensation  Visit Diagnosis: Other abnormalities of gait and mobility  Hemiplegia and hemiparesis following  nontraumatic intracerebral hemorrhage affecting right dominant side (HCC)  Muscle weakness (generalized)     Problem List Patient Active Problem List   Diagnosis Date Noted  . 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 Nontraumatic subcortical hemorrhage of left cerebral hemisphere ([place]) 08/27/2019  . Acute blood loss anemia   . Hypoalbuminemia due to protein-calorie malnutrition ([place])   . Thrombocytopenia ([place])   . Dysphagia 07/29/2019  . Infarction of left basal ganglia ([place]) 07/18/2019  . Hypernatremia   . Leukocytosis   . Essential hypertension   . Global aphasia   . Cytotoxic brain edema ([place]) 07/10/2019  . ICH (intracerebral hemorrhage) ([place]) 07/09/2019  . Anxiety state 02/21/2015  . Cephalalgia 12/21/2014    Electa Sniff, PT, DPT, NCS 01/06/2020, 5:47 PM  [facility] [address] [place], [place], [address] Phone: [phone]   Fax:  [phone]  Name: Melanie Madden MRN: [id number] Date of Birth: 05/03/1966

## 2020-01-06 NOTE — Patient Instructions (Signed)
     Stand at the counter.  Place both hands on the counter so that you can put some light weight through right arm/hand.  **Have someone stand on left side for safety/balance.  1.  Shift weight forward with head/chest up, and then back slowly.   2.  Shift weight to right side slowly.  3.  Reach up to retreive light objects from cabinet with left hand (right hand on the counter).

## 2020-01-06 NOTE — Therapy (Signed)
[facility] [address] [place], [place], [address] Phone: [phone]   Fax:  [phone]  Occupational Therapy Treatment  Patient Details  Name: Melanie Madden MRN: [id number] Date of Birth: 1966/05/04 Referring Provider (OT): Dr. Alysia Penna   Encounter Date: 01/06/2020  OT End of Session - 01/06/20 1233    Visit Number  29    Number of Visits  40    Date for OT Re-Evaluation  01/27/20    Authorization Type  [employer] 2021:  No prior auth OT, 60 DAY visit limit (regardless of how many therapies each day).    Authorization - Visit Number  35   32 combined   Authorization - Number of Visits  73    OT Start Time  5631    OT Stop Time  1315    OT Time Calculation (min)  40 min    Activity Tolerance  Patient tolerated treatment well    Behavior During Therapy  Impulsive       Past Medical History:  Diagnosis Date  . Allergy   . Anxiety   . Arthritis   . High triglycerides   . Kidney stones   . Migraines   . Recurrent sinus infections     Past Surgical History:  Procedure Laterality Date  . ANTERIOR CRUCIATE LIGAMENT REPAIR Left   . BUNIONECTOMY Right   . LASIK    . LITHOTRIPSY    . MENISCUS REPAIR Left   . NASAL SEPTUM SURGERY    . SHOULDER SURGERY    . VARICOSE VEIN SURGERY Left     There were no vitals filed for this visit.  Subjective Assessment - 01/06/20 1233    Subjective   pt denies pain    Patient is accompanied by:  --   aide   Pertinent History  Subcortical hemorrhage of R cerbral hemisphere.  PMH:  HTN, anxiety, migraines, hx of shoulder injection approx 2 weeks ago for R shoulder pain, hx of R shoulder surgery (arthroscopic)    Limitations  aphasia, fall risk, impulsive (husband reports hx of OCD)    Special Tests  PER HUSBAND for sit>stand:  pt responds to "big push" with knee blocked on both side    Patient Stated Goals  husband reports: to be able to care for  herself more (pt indicates that she onces to return to previous activities per questioning/gestures)    Currently in Pain?  No/denies          Sitting, wt. Bearing on elbows in forward flex with min A with anterior/posterior pelvic tilts and scapular depression/retraction and then AAROM with BUEs to push rolling table forward/back with elbows on table and min A.  Wt. Bearing through R hand on mat in ER/abduction with body on arm movements with min cueing.  AAROM shoulder flex horizontal abduction/adduction with with min facilitation/cueing.  AAROM shoulder flex/elbow ext on slight diagonal surface with min-mod facilitation.  Sitting, Therapist assisted low-range supported reach AAROM to grasp/release bottle with mod facilitation/cues. Pt able to initiate thumb movement inconsistently.   Standing at counter with both hands on counter with lateral wt. Shifts, LUE functional reach overhead, and lateral wt. Shifts to help with midline alignment, decr tone in R hand, and increased RUE activation/light wt. Bearing with mod facilitation/cues initially for balance/R knee blocked, then CGA.  Improved midline alignment, decr R hand tone, and improved ambulation with less abduction after activity.  In sitting, bilateral low-range functional  reach for RUE stabilization/AAROM in small range with min facilitation and cueing for normal movement patterns.        OT Education - 01/06/20 1442    Education Details  Light wt. bearing in standing--see pt instructions    Person(s) Educated  Patient;Caregiver(s)    Methods  Explanation;Demonstration;Handout;Tactile cues;Verbal cues    Comprehension  Verbalized understanding;Returned demonstration;Verbal cues required       OT Short Term Goals - 01/06/20 1445      OT SHORT TERM GOAL #1   Title  --      OT SHORT TERM GOAL #2   Title  --    Status  --      OT SHORT TERM GOAL #3   Title  --      OT SHORT TERM GOAL #4   Title  Pt will be able to  brush hair with set-up.    Time  4    Period  Weeks    Status  On-going   fluctuating assist needed   Target Date  12/27/19      OT SHORT TERM GOAL #5   Title  --      OT SHORT TERM GOAL #6   Title  Pt will be able to stabilze light object in [place] for low level bilateral reach.--check updated STGs 12/27/19    Time  4    Period  Weeks    Status  On-going   01/06/20:  not consistent/min A   Target Date  01/08/20   extend due to decr frequency due to scheduling     OT [place] #7   Title  Pt will be able to use RUE as a stablizer consistently for selected tasks mod I.    Time  4    Period  Weeks    Status  New    Target Date  01/08/20        OT Long Term Goals - 01/06/20 1447      OT LONG TERM GOAL #1   Title  Pt/husband will be independent with HEP for functional activities for incr participation in IADLs/leisure tasks (and incorporating RUE if able).--check updated LTGs 01/27/20    Time  8    Period  Weeks    Status  On-going      OT LONG TERM GOAL #2   Title  --      OT LONG TERM GOAL #3   Title  --      OT LONG TERM GOAL #4   Title  --      OT LONG TERM GOAL #5   Title  Pt will perform simple cold snack prep  with min A/set up.    Time  8    Period  Weeks    Status  Revised   11/24/19:  not performing, not using w/c in the home anymore     OT LONG TERM GOAL #6   Title  --      OT LONG TERM GOAL #7   Title  Pt will demo at least 20* active shoulder flex in prep for functional low-level reach.    Time  8    Period  Weeks    Status  New      OT LONG TERM GOAL #8   Title  Pt will be able to weight bear through RUE in standing at counter to assist with ADL/IADL tasks.    Time  8    Period  Weeks    Status  New            Plan - 01/06/20 1234    Clinical Impression Statement  Pt demo improved midline alignment with standing today, improved with wt. shift and cueing.  Pt also demo incr consistency with AAROM/closed-chain abduction today.     Occupational performance deficits (Please refer to evaluation for details):  ADL's;IADL's;Leisure;Work;Social Participation    Body Structure / Function / Physical Skills  ADL;ROM;IADL;Sensation;Mobility;Strength;Tone;UE functional use;Decreased knowledge of precautions;Decreased knowledge of use of DME;Coordination;Balance;FMC;GMC    Cognitive Skills  Attention;Temperament/Personality;Understand;Thought;Safety Awareness    OT Frequency  2x / week    OT Duration  8 weeks   may be modified d/t scheduling needs   OT Treatment/Interventions  Self-care/ADL training;Moist Heat;DME and/or AE instruction;Splinting;Therapeutic activities;Aquatic Therapy;Cognitive remediation/compensation;Therapeutic exercise;Cryotherapy;Neuromuscular education;Functional Mobility Training;Passive range of motion;Visual/perceptual remediation/compensation;Patient/family education;Manual Therapy;Electrical Stimulation;Fluidtherapy    Plan  retrieve items from refridgerator from w/c level,  finish checking STGs    Consulted and Agree with Plan of Care  Patient;Other (Comment)    Family Member Consulted  caregiver       Patient will benefit from skilled therapeutic intervention in order to improve the following deficits and impairments:   Body Structure / Function / Physical Skills: ADL, ROM, IADL, Sensation, Mobility, Strength, Tone, UE functional use, Decreased knowledge of precautions, Decreased knowledge of use of DME, Coordination, Balance, FMC, GMC Cognitive Skills: Attention, Temperament/Personality, Understand, Thought, Safety Awareness     Visit Diagnosis: Hemiplegia and hemiparesis following nontraumatic intracerebral hemorrhage affecting right dominant side (HCC)  Other disturbances of skin sensation  Unspecified symptoms and signs involving cognitive functions following other nontraumatic intracranial hemorrhage  Muscle weakness (generalized)  Other abnormalities of gait and mobility    Problem  List Patient Active Problem List   Diagnosis Date Noted  . 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 Nontraumatic subcortical hemorrhage of left cerebral hemisphere ([place]) 08/27/2019  . Acute blood loss anemia   . Hypoalbuminemia due to protein-calorie malnutrition ([place])   . Thrombocytopenia ([place])   . Dysphagia 07/29/2019  . Infarction of left basal ganglia ([place]) 07/18/2019  . Hypernatremia   . Leukocytosis   . Essential hypertension   . Global aphasia   . Cytotoxic brain edema ([place]) 07/10/2019  . ICH (intracerebral hemorrhage) ([place]) 07/09/2019  . Anxiety state 02/21/2015  . Cephalalgia 12/21/2014    [facility] 01/06/2020, 2:48 PM  [facility] [address] [place], [place], [address] Phone: [phone]   Fax:  [phone]  Name: Melanie Madden MRN: [id number] Date of Birth: [birth year]   Vianne Bulls, OTR/L [facility] [address] [place], [place]  [phone] phone [phone] 01/06/20 2:48 PM

## 2020-01-11 ENCOUNTER — Ambulatory Visit: Payer: Managed Care, Other (non HMO)

## 2020-01-11 ENCOUNTER — Ambulatory Visit: Payer: Managed Care, Other (non HMO) | Admitting: Physical Therapy

## 2020-01-11 ENCOUNTER — Other Ambulatory Visit: Payer: Self-pay

## 2020-01-11 ENCOUNTER — Ambulatory Visit: Payer: Managed Care, Other (non HMO) | Admitting: Occupational Therapy

## 2020-01-11 DIAGNOSIS — R2689 Other abnormalities of gait and mobility: Secondary | ICD-10-CM | POA: Diagnosis not present

## 2020-01-11 DIAGNOSIS — I69151 Hemiplegia and hemiparesis following nontraumatic intracerebral hemorrhage affecting right dominant side: Secondary | ICD-10-CM

## 2020-01-11 DIAGNOSIS — M6281 Muscle weakness (generalized): Secondary | ICD-10-CM

## 2020-01-11 NOTE — Therapy (Signed)
[facility] [address] [place], [place], [address] Phone: [phone]   Fax:  [phone]  Occupational Therapy Treatment  Patient Details  Name: Melanie Madden MRN: [id number] Date of Birth: 1966-01-14 Referring Provider (OT): Dr. Alysia Penna   Encounter Date: 01/11/2020   OT End of Session - 01/11/20 1435    Visit Number 30    Number of Visits 40    Date for OT Re-Evaluation 01/27/20    Authorization Type [employer] 2021:  No prior auth OT, 60 DAY visit limit (regardless of how many therapies each day).    Authorization - Visit Number 36   33 COMBINED   Authorization - Number of Visits 36    OT Start Time 1230    OT Stop Time 1320    OT Time Calculation (min) 50 min    Activity Tolerance Patient tolerated treatment well    Behavior During Therapy Impulsive           Past Medical History:  Diagnosis Date  . Allergy   . Anxiety   . Arthritis   . High triglycerides   . Kidney stones   . Migraines   . Recurrent sinus infections     Past Surgical History:  Procedure Laterality Date  . ANTERIOR CRUCIATE LIGAMENT REPAIR Left   . BUNIONECTOMY Right   . LASIK    . LITHOTRIPSY    . MENISCUS REPAIR Left   . NASAL SEPTUM SURGERY    . SHOULDER SURGERY    . VARICOSE VEIN SURGERY Left     There were no vitals filed for this visit.   Subjective Assessment - 01/11/20 1237    Pertinent History Subcortical hemorrhage of R cerbral hemisphere.  PMH:  HTN, anxiety, migraines, hx of shoulder injection approx 2 weeks ago for R shoulder pain, hx of R shoulder surgery (arthroscopic)    Limitations aphasia, fall risk, impulsive (husband reports hx of OCD)    Special Tests PER HUSBAND for sit>stand:  pt responds to "big push" with knee blocked on both side    Patient Stated Goals husband reports: to be able to care for herself more (pt indicates that she onces to return to previous activities per  questioning/gestures)    Currently in Pain? No/denies          Practiced getting items out of refrigerator from w/c level with mod cueing for w/c negotiation and problem solving. Pt then wheeled up to counter in prep for sit to stand w/ min assist and cues needed to lock w/c and remove foot rest. Pt then stood and retrieve mug from shelf w/ weight shifted to Lt unaffected side. Pt then practiced simulating pouring milk into cup and then w/ cues problem solved how to turn w/c around to replace milk in refrigerator. Pt shown rocker knife and practiced and one handed cutting board for cutting fruits/veggies w/ supervision required (handouts provided on recommended A/E)  Per caregiver request, reset EMS device for home use for wrist/finger extensor parameters.                        OT Short Term Goals - 01/06/20 1445      OT SHORT TERM GOAL #1   Title --      OT SHORT TERM GOAL #2   Title --    Status --      OT SHORT TERM GOAL #3   Title --  OT SHORT TERM GOAL #4   Title Pt will be able to brush hair with set-up.    Time 4    Period Weeks    Status On-going   fluctuating assist needed   Target Date 12/27/19      OT SHORT TERM GOAL #5   Title --      OT SHORT TERM GOAL #6   Title Pt will be able to stabilze light object in [place] for low level bilateral reach.--check updated STGs 12/27/19    Time 4    Period Weeks    Status On-going   01/06/20:  not consistent/min A   Target Date 01/08/20   extend due to decr frequency due to scheduling     OT [place] #7   Title Pt will be able to use RUE as a stablizer consistently for selected tasks mod I.    Time 4    Period Weeks    Status New    Target Date 01/08/20             OT Long Term Goals - 01/06/20 1447      OT LONG TERM GOAL #1   Title Pt/husband will be independent with HEP for functional activities for incr participation in IADLs/leisure tasks (and incorporating RUE if able).--check updated  LTGs 01/27/20    Time 8    Period Weeks    Status On-going      OT LONG TERM GOAL #2   Title --      OT LONG TERM GOAL #3   Title --      OT LONG TERM GOAL #4   Title --      OT LONG TERM GOAL #5   Title Pt will perform simple cold snack prep  with min A/set up.    Time 8    Period Weeks    Status Revised   11/24/19:  not performing, not using w/c in the home anymore     OT LONG TERM GOAL #6   Title --      OT LONG TERM GOAL #7   Title Pt will demo at least 20* active shoulder flex in prep for functional low-level reach.    Time 8    Period Weeks    Status New      OT LONG TERM GOAL #8   Title Pt will be able to weight bear through RUE in standing at counter to assist with ADL/IADL tasks.    Time 8    Period Weeks    Status New                 Plan - 01/11/20 1439    Clinical Impression Statement Pt progressing towards independence with simple snack prep - will need reinforcement    Occupational performance deficits (Please refer to evaluation for details): ADL's;IADL's;Leisure;Work;Social Participation    Body Structure / Function / Physical Skills ADL;ROM;IADL;Sensation;Mobility;Strength;Tone;UE functional use;Decreased knowledge of precautions;Decreased knowledge of use of DME;Coordination;Balance;FMC;GMC    Cognitive Skills Attention;Temperament/Personality;Understand;Thought;Safety Awareness    OT Frequency 2x / week    OT Duration 8 weeks    OT Treatment/Interventions Self-care/ADL training;Moist Heat;DME and/or AE instruction;Splinting;Therapeutic activities;Aquatic Therapy;Cognitive remediation/compensation;Therapeutic exercise;Cryotherapy;Neuromuscular education;Functional Mobility Training;Passive range of motion;Visual/perceptual remediation/compensation;Patient/family education;Manual Therapy;Electrical Stimulation;Fluidtherapy    Plan review/reinforce techniques from today's session, finish checking STG's    Consulted and Agree with Plan of Care  Patient;Other (Comment)    Family Member Consulted caregiver  Patient will benefit from skilled therapeutic intervention in order to improve the following deficits and impairments:   Body Structure / Function / Physical Skills: ADL, ROM, IADL, Sensation, Mobility, Strength, Tone, UE functional use, Decreased knowledge of precautions, Decreased knowledge of use of DME, Coordination, Balance, FMC, GMC Cognitive Skills: Attention, Temperament/Personality, Understand, Thought, Safety Awareness     Visit Diagnosis: Hemiplegia and hemiparesis following nontraumatic intracerebral hemorrhage affecting right dominant side [facility])    Problem List Patient Active Problem List   Diagnosis Date Noted  . 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 Nontraumatic subcortical hemorrhage of left cerebral hemisphere ([place]) 08/27/2019  . Acute blood loss anemia   . Hypoalbuminemia due to protein-calorie malnutrition ([place])   . Thrombocytopenia ([place])   . Dysphagia 07/29/2019  . Infarction of left basal ganglia ([place]) 07/18/2019  . Hypernatremia   . Leukocytosis   . Essential hypertension   . Global aphasia   . Cytotoxic brain edema ([place]) 07/10/2019  . ICH (intracerebral hemorrhage) ([place]) 07/09/2019  . Anxiety state 02/21/2015  . Cephalalgia 12/21/2014    Carey Bullocks, OTR/L 01/11/2020, 2:41 PM  [facility] [address] [place], [place], [address] Phone: [phone]   Fax:  [phone]  Name: Lannie Heaps Da Glennie Madden MRN: [id number] Date of Birth: 09/05/1965

## 2020-01-12 NOTE — Therapy (Signed)
[facility] [address] [place], [place], [address] Phone: [phone]   Fax:  [phone]  Physical Therapy Treatment  Patient Details  Name: Melanie Madden MRN: [id number] Date of Birth: 1965-10-09 Referring Provider (PT): Lauraine Rinne, [place]   Encounter Date: 01/11/2020   PT End of Session - 01/12/20 1419    Visit Number 34    Number of Visits 54    Date for PT Re-Evaluation 03/29/20    Authorization Type [employer]- 60 VL combined PT/OT/ST - counts as 1 visit if seen on same day.  Not sure if 60 is hard max.  $50 copay each day - make sure PT/OT/ST on same day if possible    Authorization - Visit Number --   PT/OT/ST days combined   Authorization - Number of Visits 60    PT Start Time 1321    PT Stop Time 1400    PT Time Calculation (min) 39 min    Equipment Utilized During Treatment Gait belt   pt's R AFO; dance shoe cover to aid in foot clearance   Activity Tolerance Patient tolerated treatment well    Behavior During Therapy Impulsive           Past Medical History:  Diagnosis Date  . Allergy   . Anxiety   . Arthritis   . High triglycerides   . Kidney stones   . Migraines   . Recurrent sinus infections     Past Surgical History:  Procedure Laterality Date  . ANTERIOR CRUCIATE LIGAMENT REPAIR Left   . BUNIONECTOMY Right   . LASIK    . LITHOTRIPSY    . MENISCUS REPAIR Left   . NASAL SEPTUM SURGERY    . SHOULDER SURGERY    . VARICOSE VEIN SURGERY Left     There were no vitals filed for this visit.   Subjective Assessment - 01/12/20 1409    Subjective Denies any c/o or new issues.    Patient is accompained by: --   Caregivers   Pertinent History anxiety, migraines, HTN    Patient Stated Goals Per husband report they would like her to get stronger on right, improve mobility, and work on speech.    Currently in Pain? No/denies                             [facility]  Adult PT Treatment/Exercise - 01/11/20 1320      Transfers   Transfers Sit to Stand;Stand to Sit    Sit to Stand 5: Supervision;With upper extremity assist;From bed    Sit to Stand Details Verbal cues for technique    Stand to Sit 5: Supervision;With upper extremity assist;To bed    Stand to Sit Details (indicate cue type and reason) Verbal cues for technique    Stand to Sit Details Pt cued to fully turn and back up RLE to feel mat prior to sitting.      Ambulation/Gait   Ambulation/Gait Yes    Ambulation/Gait Assistance 4: Min guard    Ambulation/Gait Assistance Details PT provides tactile cues at L trunk and R hip to help with upright posture.  Pt cued to stand more upright and lessen left lateral lean.  Cues to lessen R circumduction, with slight improvement noted.  At times, pt demonstrates decreased R foot clearance.    Ambulation Distance (Feet) 115 Feet   230 ft (added Danceskin to R shoe)  Assistive device Hemi-walker   R AFO, danceskin on R shoe for ease of foot clearance   Gait Pattern Step-through pattern;Decreased hip/knee flexion - right;Decreased stance time - right;Right genu recurvatum;Lateral trunk lean to left;Poor foot clearance - right;Decreased dorsiflexion - right;Right circumduction    Ambulation Surface Level;Indoor      Neuro Re-ed    Neuro Re-ed Details  Standing in front of mirror with hemiwalker: kicking purple ball with right leg x 10 with PT helping to faciliate at pelvis, stepping foward and back with right leg with barrier on right to try to keep from circumducting x 10 with PT providing min assist at right leg. Stepping forward and back with LLE to increased right weight shift x 10 with PT cuing patient to slow down to try to control movement more as well as weight shift.   Standing on 4" block with LLE, performing RLE hip flexion/extension (floating without resistance of floor), 3 sets x 5 reps.  Cues to slow down to control movement.      Knee/Hip Exercises:  Aerobic   Other Aerobic SciFit x 6 min level 1.5 with PT assisting to keep right hip in neutral and verbal cues to push all the way straight  to assist with R hip/knee flexion and extension           Passive ankle dorsiflexion (with AFO removed), in sitting:  12 degrees beyond neutral.         PT Short Term Goals - 12/30/19 2018      PT SHORT TERM GOAL #1   Title Pt will decrease 5 x sit to stand from 22 sec to <20 sec from mat for improved balance and functional strength.    Baseline 22 sec on 12/30/19    Time 4    Period Weeks    Status New    Target Date 01/29/20      PT SHORT TERM GOAL #2   Title Pt will ambulate with LRAD min assist >300' on level surfaces for improved household mobility with caregiver assist.    Time 4    Period Weeks    Status New    Target Date 01/29/20             PT Long Term Goals - 12/30/19 2021      PT LONG TERM GOAL #1   Title Pt will be able to perform progressive HEP for strengthening, balance and mobility with assist of husband to continued gains at home.    Baseline PT continues to add to HEP    Time 8    Period Weeks    Status On-going    Target Date 02/28/20      PT LONG TERM GOAL #2   Title Pt will increase Berg Balance from 20/56 to >26/ 56 for improved balance.    Baseline 20/56 on 12/30/19    Time 8    Period Weeks    Status New    Target Date 02/28/20      PT LONG TERM GOAL #3   Title Pt will ambulate >400' CGA with LRAD  for improved gait ability in home.    Baseline 230' with RW min/mod assist, 37' with hemiwalker CGA.    Time 8    Period Weeks    Status Revised    Target Date 02/28/20      PT LONG TERM GOAL #4   Title Pt will increase gait speed from 0.90m/s to >0.27m/s for improved gait safety.  Baseline 0.8m/s on 12/30/19    Time 8    Period Weeks    Status New    Target Date 02/28/20                 Plan - 01/12/20 1421    Clinical Impression Statement Pt continues to demonstrate slightly  improved posture with standing exercises, and ability to initiate R hip flexion with decreased hip circumduction.  Circumduction comes on more with fatigue.  Checked ankle dorsiflexion, passively 12 degrees; she conitnues to demo recurvatum in R knee with gait.  Will continue to work towards [place].    Personal Factors and Comorbidities Comorbidity 3+    Comorbidities anxiety, migraines, HTN    Examination-Activity Limitations Bathing;Bed Mobility;Stand;Locomotion Level;Squat;Stairs;Dressing;Transfers    Examination-Participation Restrictions Community Activity;Driving;Shop;Laundry;Meal Prep    Stability/Clinical Decision Making Evolving/Moderate complexity    Rehab Potential Good    PT Frequency 2x / week    PT Duration 8 weeks    PT Treatment/Interventions ADLs/Self Care Home Management;Electrical Stimulation;DME Instruction;Gait training;Stair training;Functional mobility training;Therapeutic activities;Therapeutic exercise;Orthotic Fit/Training;Patient/family education;Neuromuscular re-education;Balance training;Manual techniques;Passive range of motion;Vestibular;Wheelchair mobility training    PT Next Visit Plan Danceskin to front of right shoe to help slide with gait. Continue with activities in standing to facilitate increased right weight shift and R hip flexion/initiation of R swing phase, continue gait training with RW with right hand grip attachment as well as gait with hemiwalker with givmohr sling use.  Pt responded well to short bout on SciFit and may benefit for use to help wtih RLE strength, NMR.    Consulted and Agree with Plan of Care Patient    Family Member Consulted Caregiver           Patient will benefit from skilled therapeutic intervention in order to improve the following deficits and impairments:  Abnormal gait, Decreased activity tolerance, Decreased balance, Decreased cognition, Decreased knowledge of use of DME, Decreased mobility, Decreased range of motion, Decreased  safety awareness, Decreased strength, Impaired sensation  Visit Diagnosis: Other abnormalities of gait and mobility  Hemiplegia and hemiparesis following nontraumatic intracerebral hemorrhage affecting right dominant side (HCC)  Muscle weakness (generalized)     Problem List Patient Active Problem List   Diagnosis Date Noted  . 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 Nontraumatic subcortical hemorrhage of left cerebral hemisphere ([place]) 08/27/2019  . Acute blood loss anemia   . Hypoalbuminemia due to protein-calorie malnutrition ([place])   . Thrombocytopenia ([place])   . Dysphagia 07/29/2019  . Infarction of left basal ganglia ([place]) 07/18/2019  . Hypernatremia   . Leukocytosis   . Essential hypertension   . Global aphasia   . Cytotoxic brain edema ([place]) 07/10/2019  . ICH (intracerebral hemorrhage) ([place]) 07/09/2019  . Anxiety state 02/21/2015  . Cephalalgia 12/21/2014    Jonaya Freshour W. 01/12/2020, 2:25 PM [facility] [address] [place], [place], [address] Phone: [phone]   Fax:  [phone]  Name: Melanie Madden MRN: [id number] Date of Birth: [birth year]

## 2020-01-13 ENCOUNTER — Ambulatory Visit: Payer: Managed Care, Other (non HMO) | Admitting: Physical Therapy

## 2020-01-13 ENCOUNTER — Encounter: Payer: Self-pay | Admitting: Speech Pathology

## 2020-01-13 ENCOUNTER — Ambulatory Visit: Payer: Managed Care, Other (non HMO) | Admitting: Occupational Therapy

## 2020-01-13 ENCOUNTER — Other Ambulatory Visit: Payer: Self-pay

## 2020-01-13 ENCOUNTER — Ambulatory Visit: Payer: Managed Care, Other (non HMO) | Admitting: Speech Pathology

## 2020-01-13 ENCOUNTER — Encounter: Payer: Self-pay | Admitting: Physical Therapy

## 2020-01-13 ENCOUNTER — Ambulatory Visit: Payer: Managed Care, Other (non HMO)

## 2020-01-13 DIAGNOSIS — M6281 Muscle weakness (generalized): Secondary | ICD-10-CM

## 2020-01-13 DIAGNOSIS — R2689 Other abnormalities of gait and mobility: Secondary | ICD-10-CM

## 2020-01-13 DIAGNOSIS — R4701 Aphasia: Secondary | ICD-10-CM

## 2020-01-13 DIAGNOSIS — R41841 Cognitive communication deficit: Secondary | ICD-10-CM

## 2020-01-13 DIAGNOSIS — I69153 Hemiplegia and hemiparesis following nontraumatic intracerebral hemorrhage affecting right non-dominant side: Secondary | ICD-10-CM

## 2020-01-13 DIAGNOSIS — I69219 Unspecified symptoms and signs involving cognitive functions following other nontraumatic intracranial hemorrhage: Secondary | ICD-10-CM

## 2020-01-13 DIAGNOSIS — I69151 Hemiplegia and hemiparesis following nontraumatic intracerebral hemorrhage affecting right dominant side: Secondary | ICD-10-CM

## 2020-01-13 DIAGNOSIS — R482 Apraxia: Secondary | ICD-10-CM

## 2020-01-13 NOTE — Therapy (Signed)
[facility] [address] [place], [place], [address] Phone: [phone]   Fax:  [phone]  Physical Therapy Treatment  Patient Details  Name: Melanie Madden MRN: [id number] Date of Birth: 1966-04-03 Referring Provider (PT): Lauraine Rinne, [place]   Encounter Date: 01/13/2020   PT End of Session - 01/13/20 2227    Visit Number 35    Number of Visits 54    Date for PT Re-Evaluation 03/29/20    Authorization Type [employer]- 60 VL combined PT/OT/ST - counts as 1 visit if seen on same day.  Not sure if 60 is hard max.  $50 copay each day - make sure PT/OT/ST on same day if possible    Authorization - Visit Number --   PT/OT/ST days combined   Authorization - Number of Visits 60    PT Start Time 1400    PT Stop Time 1445    PT Time Calculation (min) 45 min    Equipment Utilized During Treatment Gait belt   pt's R AFO; dance shoe cover to aid in foot clearance   Activity Tolerance Patient tolerated treatment well    Behavior During Therapy Impulsive           Past Medical History:  Diagnosis Date  . Allergy   . Anxiety   . Arthritis   . High triglycerides   . Kidney stones   . Migraines   . Recurrent sinus infections     Past Surgical History:  Procedure Laterality Date  . ANTERIOR CRUCIATE LIGAMENT REPAIR Left   . BUNIONECTOMY Right   . LASIK    . LITHOTRIPSY    . MENISCUS REPAIR Left   . NASAL SEPTUM SURGERY    . SHOULDER SURGERY    . VARICOSE VEIN SURGERY Left     There were no vitals filed for this visit.   Subjective Assessment - 01/13/20 1403    Subjective Feeling a little upset after speech.  Nothing new to report.    Patient is accompained by: --   Caregivers   Pertinent History anxiety, migraines, HTN    Patient Stated Goals Per husband report they would like her to get stronger on right, improve mobility, and work on speech.    Currently in Pain? No/denies                              [facility] Adult PT Treatment/Exercise - 01/13/20 2146      Transfers   Transfers Sit to Stand;Stand to Sit    Sit to Stand 4: Min assist    Sit to Stand Details (indicate cue type and reason) From mat with L foot on 2" step to wedge COG towards RLE for increased WB and activation; performed repeated sit > stand first with therapist beside patient and then therapist in front of patient.  Therapist provided tactile and verbal cues for trunk oriented in midline or slightly R during sit > stand for further weight shift.  When therapist in front of pt, therapist had pt place bilat UE on therapist's shoulders (therapist supporting RUE) for more midline position of trunk, to cue pt to "push" therapist away instead of pulling up on therapist to facilitate increased extension and to provide slight resistance to slow pt down and increase control    Stand to Sit 4: Min assist    Stand to Sit Details Set up as above: greater focus  on maintaining weight shift over RLE and increased eccentric control when transitioning standing > sitting      Ambulation/Gait   Ambulation/Gait Yes    Ambulation/Gait Assistance 3: Mod assist;2: Max assist    Ambulation/Gait Assistance Details NMR during gait training around gym with R turns and L turns.  Had pt place UE on therapist's shoulders and therapist supported RUE and provided manual facilitation at trunk and pelvis to elongate trunk on R side during R stance to increase weight shift to RLE during R stance and increase step length with LLE.  Also utilized therapist's body position in front to slow down patient's gait, promote more upright posture, increase weight shift, stance time and extension activation during stance phase.  Pt fatigued quickly resulting in increased L lateral lean and decreased R stance time and decreased L step length    Ambulation Distance (Feet) 230 Feet   with one seated rest break   Assistive device Other (Comment)    UE on therapist's shoulders   Gait Pattern Step-to pattern;Step-through pattern;Decreased step length - left;Decreased stance time - right;Decreased stride length;Decreased dorsiflexion - right;Decreased hip/knee flexion - right;Decreased weight shift to right;Lateral hip instability;Lateral trunk lean to left    Ambulation Surface Level;Indoor      Neuro Re-ed    Neuro Re-ed Details  Assisted pt into tall kneeling on mat with UE support on bench.  Performed tall kneeling squats with UE support but pt unable to bring hips into full extension despite max verbal, visual and tactile cues from therapist and visual demonstration.  Pt very fearful in tall kneeling and would not release UE support on bench.  Pt began to report pain in R knee - returned to sitting on mat.                    PT Short Term Goals - 12/30/19 2018      PT SHORT TERM GOAL #1   Title Pt will decrease 5 x sit to stand from 22 sec to <20 sec from mat for improved balance and functional strength.    Baseline 22 sec on 12/30/19    Time 4    Period Weeks    Status New    Target Date 01/29/20      PT SHORT TERM GOAL #2   Title Pt will ambulate with LRAD min assist >300' on level surfaces for improved household mobility with caregiver assist.    Time 4    Period Weeks    Status New    Target Date 01/29/20             PT Long Term Goals - 12/30/19 2021      PT LONG TERM GOAL #1   Title Pt will be able to perform progressive HEP for strengthening, balance and mobility with assist of husband to continued gains at home.    Baseline PT continues to add to HEP    Time 8    Period Weeks    Status On-going    Target Date 02/28/20      PT LONG TERM GOAL #2   Title Pt will increase Berg Balance from 20/56 to >26/ 56 for improved balance.    Baseline 20/56 on 12/30/19    Time 8    Period Weeks    Status New    Target Date 02/28/20      PT LONG TERM GOAL #3   Title Pt will ambulate >400' CGA with LRAD  for  improved gait ability in home.    Baseline 230' with RW min/mod assist, 59' with hemiwalker CGA.    Time 8    Period Weeks    Status Revised    Target Date 02/28/20      PT LONG TERM GOAL #4   Title Pt will increase gait speed from 0.24m/s to >0.59m/s for improved gait safety.    Baseline 0.83m/s on 12/30/19    Time 8    Period Weeks    Status New    Target Date 02/28/20                 Plan - 01/13/20 2228    Clinical Impression Statement Continued to utilize NMR in tall kneeling, during sit <> stand and gait training without use of hemi-walker today to focus on postural control, weight shifting and WB through RUE and RLE.  Pt very motivated to participate in all activities but due to apraxia and aphasia pt has increased difficulty performing novel activities (tall kneeling, gait with UE on therapist's shoulders).  Pt's ability to attend to activity and cues improved with repetition and exposure to activity.  Will continue to address and progress towards LTG.    Personal Factors and Comorbidities Comorbidity 3+    Comorbidities anxiety, migraines, HTN    Examination-Activity Limitations Bathing;Bed Mobility;Stand;Locomotion Level;Squat;Stairs;Dressing;Transfers    Examination-Participation Restrictions Community Activity;Driving;Shop;Laundry;Meal Prep    Stability/Clinical Decision Making Evolving/Moderate complexity    Rehab Potential Good    PT Frequency 2x / week    PT Duration 8 weeks    PT Treatment/Interventions ADLs/Self Care Home Management;Electrical Stimulation;DME Instruction;Gait training;Stair training;Functional mobility training;Therapeutic activities;Therapeutic exercise;Orthotic Fit/Training;Patient/family education;Neuromuscular re-education;Balance training;Manual techniques;Passive range of motion;Vestibular;Wheelchair mobility training    PT Next Visit Plan [place] to front of right shoe to help slide with gait. Continue with activities in standing to  facilitate increased right weight shift and R hip flexion/initiation of R swing phase, continue gait training with RW with right hand grip attachment as well as gait with hemiwalker with givmohr sling use.  Pt responded well to short bout on SciFit and may benefit for use to help wtih RLE strength.  If you have skilled +2 could try tall kneeling again, gait with her UE on your shoulders to get more upright posture, slow her down and get more weight shift.    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 Nontraumatic subcortical hemorrhage of left cerebral hemisphere ([place]) 08/27/2019  . Acute blood loss anemia   . Hypoalbuminemia due to protein-calorie malnutrition ([place])   . Thrombocytopenia ([place])   . Dysphagia 07/29/2019  . Infarction of left basal ganglia ([place]) 07/18/2019  . Hypernatremia   . Leukocytosis   . Essential hypertension   . Global aphasia   . Cytotoxic brain edema ([place]) 07/10/2019  .  ICH (intracerebral hemorrhage) ([place]) 07/09/2019  . Anxiety state 02/21/2015  . Cephalalgia 12/21/2014   Rico Junker, PT, DPT 01/13/20    10:41 PM    [facility] [address]  [place], [place], [address] Phone: [phone]   Fax:  [phone]  Name: Melanie Madden MRN: [id number] Date of Birth: 1965/08/15

## 2020-01-13 NOTE — Therapy (Signed)
[facility] [address] [place], [place], [address] Phone: [phone]   Fax:  [phone]  Speech Language Pathology Treatment  Patient Details  Name: Melanie Madden MRN: [id number] Date of Birth: Jan 01, 1966 Referring Provider (SLP): Alysia Penna, MD   Encounter Date: 01/13/2020   End of Session - 01/13/20 1511    Visit Number 30    Number of Visits 41    Date for SLP Re-Evaluation 02/26/20    Authorization Time Period 60 DAYS of therapy regardless of number disciplines per day    SLP Start Time 1319    SLP Stop Time  1400    SLP Time Calculation (min) 41 min    Activity Tolerance Patient tolerated treatment well           Past Medical History:  Diagnosis Date  . Allergy   . Anxiety   . Arthritis   . High triglycerides   . Kidney stones   . Migraines   . Recurrent sinus infections     Past Surgical History:  Procedure Laterality Date  . ANTERIOR CRUCIATE LIGAMENT REPAIR Left   . BUNIONECTOMY Right   . LASIK    . LITHOTRIPSY    . MENISCUS REPAIR Left   . NASAL SEPTUM SURGERY    . SHOULDER SURGERY    . VARICOSE VEIN SURGERY Left     There were no vitals filed for this visit.   Subjective Assessment - 01/13/20 1503    Subjective Nods to "Do you want to work on speech today"    Currently in Pain? No/denies                 ADULT SLP TREATMENT - 01/13/20 1504      General Information   Behavior/Cognition Alert;Cooperative;Impulsive;Requires cueing      Treatment Provided   Treatment provided Cognitive-Linquistic      Cognitive-Linquistic Treatment   Treatment focused on Aphasia;Apraxia;Patient/family/caregiver education    Skilled Treatment Lore spontaneously turned on AAC device and locate phoneme and cv videos we made last session, played 2 and demonstrated how she practiced. Melanie Madden required verbal cues for mouth placement and to make both sounds. She  benefitted from cues to slow down and use the mirror to check her placement. Ongoing filming of new videos for "you" broken to ee-uu, sh and voiceless vowels (whisper) and uu w n (one). Melanie Madden locates other icons to command with occasional min A. Again she communicated she did not want to attempt writing or drawing as communication means.       Assessment / Recommendations / Plan   Plan Continue with current plan of care      Progression Toward Goals   Progression toward goals Progressing toward goals            SLP Education - 01/13/20 1508    Education Details use mirror, check your mouth placement and pause the videos as needed    Person(s) Educated Patient    Methods Explanation;Verbal cues;Demonstration    Comprehension Returned demonstration;Verbal cues required;Need further instruction            SLP Short Term Goals - 01/13/20 1509      SLP SHORT TERM GOAL #1   Title pt will simultaneously with SLP produce 3 different phonemes 50% of the time over three sessions    Status Partially Met      SLP SHORT TERM GOAL #2   Title pt will ID objects  salient to pt in f:4 80% of the time over 3 sessions    Status Deferred      SLP [place] #3   Title pt will write her full name on first attempt over three sessions    Time 1    Status Deferred      SLP SHORT TERM GOAL #4   Title pt will attempt expressive communication via multimodal means during 5 sessions    Status Not Met      SLP SHORT TERM GOAL #5   Title pt will demonstrate undertanding of simple 1-step functional commands with 70% accuracy with multimodal cues over 3 sessions    Status Not Met      SLP SHORT TERM GOAL #6   Title pt will look at object or line drawing and match the correct word from f:6 words 90% success over 3 sessions    Status Deferred      SLP SHORT TERM GOAL #7   Title pt will demo knowledge of pertinent /functional information placement on speech generating device 75% with rare min A     Time 4    Period Weeks    Status Achieved      SLP SHORT TERM GOAL #8   Title pt will demonstrate how to generate a new icon with written cues for procedure (max A for spelling) x3 sessions    Time 4    Period Weeks    Status New      SLP SHORT TERM GOAL  #9   TITLE pt will communicate simple information (places, people, food, etc) using Lingraphica with occasional min a for navigation x3 sessions    Time 3    Period Weeks    Status On-going            SLP Long Term Goals - 01/13/20 [place] #1   Title pt's diet will be appropriately upgraded as needed by SLP    Period --   or 25 total visits, for all LTGs   Status Deferred   pt eating preferred diet without overt s/sx aspiration PNA     SLP LONG TERM GOAL #2   Title pt will produce 3 simple words/words pertinent to pt (family or pet names, etc) simultaneously with SLP and multimodal cues over three sessions    Status Not Met      SLP LONG TERM GOAL #3   Title pt will demo understanding of simple yes/no questions pertinent to desires/wants with multimodal cues 90% of the time over 3 sessions    Status Not Met      SLP LONG TERM GOAL #4   Title a device trial with a speech generating device will be initiated within the first 20 ST visits    Status Achieved      SLP LONG TERM GOAL #5   Title pt will demo recognition of written word for a common object 100% success over 3 sessions    Baseline 11-13-19, 11-20-19    Status Achieved      SLP LONG TERM GOAL #6   Title pt will work on speech-related tasks at home between 75% of sessions as reported by family and/or noted on [employer] website    Baseline 10-21-19, 10-26-19, 10-28-19, 11-11-19, 11-13-19, 11-20-19, 11-24-19, 11-27-19    Status Partially Met      SLP LONG TERM GOAL #7   Title pt will demo knowledge of specific  pertinent /functional information placement on speech generating device 90%    Time 6    Period Weeks   or 41 total sessions,  for all LTGs   Status On-going      SLP LONG TERM GOAL #8   Title pt will demonstrate how to generate a new icon with occasional min A for procedure (max A for spelling) x3 sessions    Time 7    Period Weeks    Status On-going      SLP LONG TERM GOAL  #9   TITLE pt will communicate feelings or other personal mod complex information using Lingraphica with rare min a for navigation x3 sessions    Time 7    Period Weeks    Status On-going            Plan - 01/13/20 1509    Clinical Impression Statement Pt expressive language and speech cont non functional. She continues to demonstrate deficits of attention, awareness, impulsivity. Severe aphasia and verbal apraxia. Ongoing training in AAC device and multimodal communication. Low frustration tolerance affects progress in ST. Continue skilled ST to maximize communication of wants/needs and to reduce caregiver burden    Speech Therapy Frequency 2x / week    Duration --   18 weeks or 34 visits   Treatment/Interventions Diet toleration management by SLP;Trials of upgraded texture/liquids;Language facilitation;Internal/external aids;Cognitive reorganization;Cueing hierarchy;SLP instruction and feedback;Patient/family education;Compensatory strategies;Multimodal communcation approach;Functional tasks;Environmental controls    Potential to Achieve Goals Fair    Potential Considerations Severity of impairments;Cooperation/participation level           Patient will benefit from skilled therapeutic intervention in order to improve the following deficits and impairments:   Aphasia  Verbal apraxia  Cognitive communication deficit    Problem List Patient Active Problem List   Diagnosis Date Noted  . 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 Nontraumatic subcortical hemorrhage of left cerebral hemisphere ([place]) 08/27/2019  . Acute blood loss anemia   . Hypoalbuminemia due to protein-calorie  malnutrition ([place])   . Thrombocytopenia ([place])   . Dysphagia 07/29/2019  . Infarction of left basal ganglia ([place]) 07/18/2019  . Hypernatremia   . Leukocytosis   . Essential hypertension   . Global aphasia   . Cytotoxic brain edema ([place]) 07/10/2019  . ICH (intracerebral hemorrhage) ([place]) 07/09/2019  . Anxiety state 02/21/2015  . Cephalalgia 12/21/2014    Melanie Madden, Melanie Rusk MS, CCC-SLP 01/13/2020, 3:12 PM  [facility] [address] [place], [place], [address] Phone: [phone]   Fax:  [phone]   Name: Melanie Madden MRN: [id number] Date of Birth: 1965/11/11

## 2020-01-13 NOTE — Therapy (Signed)
[facility] [address] [place], [place], [address] Phone: [phone]   Fax:  [phone]  Occupational Therapy Treatment  Patient Details  Name: Melanie Madden MRN: [id number] Date of Birth: 09-28-1965 Referring Provider (OT): Dr. Alysia Penna   Encounter Date: 01/13/2020   OT End of Session - 01/13/20 1426    Visit Number 31    Number of Visits 40    Date for OT Re-Evaluation 01/27/20    Authorization Type [employer] 2021:  No prior auth OT, 60 DAY visit limit (regardless of how many therapies each day).    Authorization - Visit Number 29   34 combined   Authorization - Number of Visits 33    OT Start Time 1230    OT Stop Time 1315    OT Time Calculation (min) 45 min    Activity Tolerance Patient tolerated treatment well    Behavior During Therapy Impulsive           Past Medical History:  Diagnosis Date  . Allergy   . Anxiety   . Arthritis   . High triglycerides   . Kidney stones   . Migraines   . Recurrent sinus infections     Past Surgical History:  Procedure Laterality Date  . ANTERIOR CRUCIATE LIGAMENT REPAIR Left   . BUNIONECTOMY Right   . LASIK    . LITHOTRIPSY    . MENISCUS REPAIR Left   . NASAL SEPTUM SURGERY    . SHOULDER SURGERY    . VARICOSE VEIN SURGERY Left     There were no vitals filed for this visit.   Subjective Assessment - 01/13/20 1238    Patient is accompanied by: --   caregiver   Pertinent History Subcortical hemorrhage of R cerbral hemisphere.  PMH:  HTN, anxiety, migraines, hx of shoulder injection approx 2 weeks ago for R shoulder pain, hx of R shoulder surgery (arthroscopic)    Limitations aphasia, fall risk, impulsive (husband reports hx of OCD)    Special Tests PER HUSBAND for sit>stand:  pt responds to "big push" with knee blocked on both side    Patient Stated Goals husband reports: to be able to care for herself more (pt indicates that she onces  to return to previous activities per questioning/gestures)    Currently in Pain? No/denies          Pt asked to complete same task as previous session with initial review w/ pt and caregiver prior to beginning - pt instructed to negotiate w/c herself, retrieve items out of refrigerator, then carry over to counter from w/c level. Pt then required cues to lock w/c and remove leg rest before standing to retrieve glass from cabinet. Pt then stood to pour milk into glass w/ close sup, sat back down in w/c and transported glass of milk to table from w/c level.  Pt/caregiver instructed to practice at home with supervision. Emphasis placed on staying on Lt strong side in standing w/ functional reaching tasks for safety/fall prevention, however can practice wt shifts to Rt weak side w/ close supervision and one hand countertop support. Assessed remaining STG's and began preparation for d/c in 2 weeks. Discussed thoroughly reasoning for d/c with patient and caregiver and encouraged pt's husband to come in or call if he had any questions.                         OT Short  Term Goals - 01/13/20 1301      OT SHORT TERM GOAL #1   Title Pt/husband will be independent with initial HEP for RUE ROM--check STGs 10/14/19    Time 6    Period Weeks    Status Achieved      OT SHORT TERM GOAL #2   Title Pt will perform UB dressing with min A.    Time 6    Period Weeks    Status Achieved   MOD ASSIST.  11/11/19:  pt able to don/doff zip-up fleece mod I.  11/17/19:  met per husband report     OT SHORT TERM GOAL #3   Title Pt will perform UB bathing with mod A.    Time 6    Period Weeks    Status Achieved   min assist for Lt arm     OT SHORT TERM GOAL #4   Title Pt will be able to brush hair with set-up.    Time 4    Period Weeks    Status Achieved    Target Date 12/27/19      OT SHORT TERM GOAL #5   Title Pt will demo at least 110* shoulder flex PROM in supine without pain for incr ease  for ADLs.    Time 6    Period Weeks    Status Achieved      OT SHORT TERM GOAL #6   Title Pt will be able to stabilze light object in [place] for low level bilateral reach.--check updated STGs 12/27/19    Time 4    Period Weeks    Status Not Met    Target Date 01/08/20   extend due to decr frequency due to scheduling     OT SHORT TERM GOAL #7   Title Pt will be able to use RUE as a stablizer consistently for selected tasks mod I.    Time 4    Period Weeks    Status Not Met    Target Date 01/08/20             OT Long Term Goals - 01/06/20 1447      OT LONG TERM GOAL #1   Title Pt/husband will be independent with HEP for functional activities for incr participation in IADLs/leisure tasks (and incorporating RUE if able).--check updated LTGs 01/27/20    Time 8    Period Weeks    Status On-going      OT LONG TERM GOAL #2   Title --      OT LONG TERM GOAL #3   Title --      OT LONG TERM GOAL #4   Title --      OT LONG TERM GOAL #5   Title Pt will perform simple cold snack prep  with min A/set up.    Time 8    Period Weeks    Status Revised   11/24/19:  not performing, not using w/c in the home anymore     OT LONG TERM GOAL #6   Title --      OT LONG TERM GOAL #7   Title Pt will demo at least 20* active shoulder flex in prep for functional low-level reach.    Time 8    Period Weeks    Status New      OT LONG TERM GOAL #8   Title Pt will be able to weight bear through RUE in standing at counter to assist with ADL/IADL tasks.  Time 8    Period Weeks    Status New                 Plan - 01/13/20 1305    Clinical Impression Statement Pt progressing with simple snack prep from w/c level and standing to retrieve item from cabinet but requires sup and cueing for safety, w/c negotiation, and proper positioning.    Occupational performance deficits (Please refer to evaluation for details): ADL's;IADL's;Leisure;Work;Social Participation    Body Structure /  Function / Physical Skills ADL;ROM;IADL;Sensation;Mobility;Strength;Tone;UE functional use;Decreased knowledge of precautions;Decreased knowledge of use of DME;Coordination;Balance;FMC;GMC    Cognitive Skills Attention;Temperament/Personality;Understand;Thought;Safety Awareness    OT Frequency 2x / week    OT Duration 8 weeks    OT Treatment/Interventions Self-care/ADL training;Moist Heat;DME and/or AE instruction;Splinting;Therapeutic activities;Aquatic Therapy;Cognitive remediation/compensation;Therapeutic exercise;Cryotherapy;Neuromuscular education;Functional Mobility Training;Passive range of motion;Visual/perceptual remediation/compensation;Patient/family education;Manual Therapy;Electrical Stimulation;Fluidtherapy    Plan continue NMR for RUE, estim, further training in compensatory strategies    Consulted and Agree with Plan of Care Patient;Other (Comment)    Family Member Consulted caregiver           Patient will benefit from skilled therapeutic intervention in order to improve the following deficits and impairments:   Body Structure / Function / Physical Skills: ADL, ROM, IADL, Sensation, Mobility, Strength, Tone, UE functional use, Decreased knowledge of precautions, Decreased knowledge of use of DME, Coordination, Balance, FMC, GMC Cognitive Skills: Attention, Temperament/Personality, Understand, Thought, Safety Awareness     Visit Diagnosis: Hemiplegia and hemiparesis following nontraumatic intracerebral hemorrhage affecting right dominant side (HCC)  Other abnormalities of gait and mobility  Unspecified symptoms and signs involving cognitive functions following other nontraumatic intracranial hemorrhage    Problem List Patient Active Problem List   Diagnosis Date Noted  . 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 Nontraumatic subcortical hemorrhage of left cerebral hemisphere ([place]) 08/27/2019  . Acute blood loss anemia   .  Hypoalbuminemia due to protein-calorie malnutrition ([place])   . Thrombocytopenia ([place])   . Dysphagia 07/29/2019  . Infarction of left basal ganglia ([place]) 07/18/2019  . Hypernatremia   . Leukocytosis   . Essential hypertension   . Global aphasia   . Cytotoxic brain edema ([place]) 07/10/2019  . ICH (intracerebral hemorrhage) ([place]) 07/09/2019  . Anxiety state 02/21/2015  . Cephalalgia 12/21/2014    Carey Bullocks, OTR/L 01/13/2020, 2:28 PM  [facility] [address] [place], [place], [address] Phone: [phone]   Fax:  [phone]  Name: Melanie Madden MRN: [id number] Date of Birth: May 11, 1966

## 2020-01-15 ENCOUNTER — Encounter: Payer: Managed Care, Other (non HMO) | Admitting: Occupational Therapy

## 2020-01-15 ENCOUNTER — Ambulatory Visit: Payer: Managed Care, Other (non HMO)

## 2020-01-18 ENCOUNTER — Encounter: Payer: Self-pay | Admitting: Speech Pathology

## 2020-01-18 ENCOUNTER — Ambulatory Visit: Payer: Managed Care, Other (non HMO) | Admitting: Speech Pathology

## 2020-01-18 ENCOUNTER — Other Ambulatory Visit: Payer: Self-pay

## 2020-01-18 ENCOUNTER — Ambulatory Visit: Payer: Managed Care, Other (non HMO)

## 2020-01-18 ENCOUNTER — Ambulatory Visit: Payer: Managed Care, Other (non HMO) | Admitting: Occupational Therapy

## 2020-01-18 DIAGNOSIS — I69153 Hemiplegia and hemiparesis following nontraumatic intracerebral hemorrhage affecting right non-dominant side: Secondary | ICD-10-CM

## 2020-01-18 DIAGNOSIS — R4701 Aphasia: Secondary | ICD-10-CM

## 2020-01-18 DIAGNOSIS — M6281 Muscle weakness (generalized): Secondary | ICD-10-CM

## 2020-01-18 DIAGNOSIS — R482 Apraxia: Secondary | ICD-10-CM

## 2020-01-18 DIAGNOSIS — R2689 Other abnormalities of gait and mobility: Secondary | ICD-10-CM | POA: Diagnosis not present

## 2020-01-18 NOTE — Therapy (Signed)
[facility] [address] [place], [place], [address] Phone: [phone]   Fax:  [phone]  Speech Language Pathology Treatment  Patient Details  Name: Melanie Madden MRN: [id number] Date of Birth: Dec 17, 1965 Referring Provider (SLP): Alysia Penna, MD   Encounter Date: 01/18/2020   End of Session - 01/18/20 1520    Visit Number 31    Number of Visits 41    Date for SLP Re-Evaluation 02/26/20    Authorization Time Period 60 DAYS of therapy regardless of number disciplines per day    SLP Start Time 9833    SLP Stop Time  1445    SLP Time Calculation (min) 30 min    Activity Tolerance Patient tolerated treatment well           Past Medical History:  Diagnosis Date  . Allergy   . Anxiety   . Arthritis   . High triglycerides   . Kidney stones   . Migraines   . Recurrent sinus infections     Past Surgical History:  Procedure Laterality Date  . ANTERIOR CRUCIATE LIGAMENT REPAIR Left   . BUNIONECTOMY Right   . LASIK    . LITHOTRIPSY    . MENISCUS REPAIR Left   . NASAL SEPTUM SURGERY    . SHOULDER SURGERY    . VARICOSE VEIN SURGERY Left     There were no vitals filed for this visit.   Subjective Assessment - 01/18/20 1458    Subjective Melanie Madden brings in her phone today and shows me - 15 minute late for ST due to using restroom    Currently in Pain? No/denies                 ADULT SLP TREATMENT - 01/18/20 1459      General Information   Behavior/Cognition Alert;Cooperative;Impulsive;Requires cueing      Treatment Provided   Treatment provided Cognitive-Linquistic      Cognitive-Linquistic Treatment   Treatment focused on Aphasia;Apraxia;Patient/family/caregiver education    Skilled Treatment [name] continues to access her AAC device independently and locate icons she wants. Max A for communicating feelings with AAC. As Melanie Madden requests to continue to work on her speech  production, we targeted cvc production, making each phoneme in isolation and in unison with graduate fade of ST. "No, none, nine, show" with frequent mod to max A. Videos of SLP producing each syllable as stated recorded on her phone for home practice      Assessment / Recommendations / [place] with current plan of care      Progression Toward Goals   Progression toward goals Progressing toward goals            SLP Education - 01/18/20 1502    Education Details go slow when practicing - use mirror    Person(s) Educated Patient    Methods Explanation;Demonstration            SLP Short Term Goals - 01/18/20 1518      SLP SHORT TERM GOAL #1   Title pt will simultaneously with SLP produce 3 different phonemes 50% of the time over three sessions    Status Partially Met      SLP [place] #2   Title pt will ID objects salient to pt in f:4 80% of the time over 3 sessions    Status Deferred      SLP [place] #3   Title  pt will write her full name on first attempt over three sessions    Time 1    Status Deferred      SLP SHORT TERM GOAL #4   Title pt will attempt expressive communication via multimodal means during 5 sessions    Status Not Met      SLP SHORT TERM GOAL #5   Title pt will demonstrate undertanding of simple 1-step functional commands with 70% accuracy with multimodal cues over 3 sessions    Status Not Met      SLP SHORT TERM GOAL #6   Title pt will look at object or line drawing and match the correct word from f:6 words 90% success over 3 sessions    Status Deferred      SLP SHORT TERM GOAL #7   Title pt will demo knowledge of pertinent /functional information placement on speech generating device 75% with rare min A    Time 4    Period Weeks    Status Achieved      SLP SHORT TERM GOAL #8   Title pt will demonstrate how to generate a new icon with written cues for procedure (max A for spelling) x3 sessions    Time 3    Period  Weeks    Status On-going      SLP SHORT TERM GOAL  #9   TITLE pt will communicate simple information (places, people, food, etc) using Lingraphica with occasional min a for navigation x3 sessions    Time 3    Period Weeks    Status Achieved            SLP Long Term Goals - 01/18/20 1519      SLP LONG TERM GOAL #1   Title pt's diet will be appropriately upgraded as needed by SLP    Period --   or 25 total visits, for all LTGs   Status Deferred   pt eating preferred diet without overt s/sx aspiration PNA     SLP LONG TERM GOAL #2   Title pt will produce 3 simple words/words pertinent to pt (family or pet names, etc) simultaneously with SLP and multimodal cues over three sessions    Status Not Met      SLP LONG TERM GOAL #3   Title pt will demo understanding of simple yes/no questions pertinent to desires/wants with multimodal cues 90% of the time over 3 sessions    Status Not Met      SLP LONG TERM GOAL #4   Title a device trial with a speech generating device will be initiated within the first 20 ST visits    Status Achieved      SLP LONG TERM GOAL #5   Title pt will demo recognition of written word for a common object 100% success over 3 sessions    Baseline 11-13-19, 11-20-19    Status Achieved      SLP LONG TERM GOAL #6   Title pt will work on speech-related tasks at home between 75% of sessions as reported by family and/or noted on [place] website    Baseline 10-21-19, 10-26-19, 10-28-19, 11-11-19, 11-13-19, 11-20-19, 11-24-19, 11-27-19    Status Partially Met      SLP LONG TERM GOAL #7   Title pt will demo knowledge of specific pertinent /functional information placement on speech generating device 90%    Time 5    Period Weeks   or 41 total sessions, for all LTGs  Status On-going      SLP LONG TERM GOAL #8   Title pt will demonstrate how to generate a new icon with occasional min A for procedure (max A for spelling) x3 sessions    Time 7    Period Weeks      Status On-going      SLP LONG TERM GOAL  #9   TITLE pt will communicate feelings or other personal mod complex information using Lingraphica with rare min a for navigation x3 sessions    Time 7    Period Weeks    Status On-going            Plan - 01/18/20 1518    Clinical Impression Statement Pt expressive language and speech cont non functional. She continues to demonstrate deficits of attention, awareness, impulsivity. Severe aphasia and verbal apraxia. Ongoing training in AAC device and multimodal communication. Low frustration tolerance affects progress in ST. Continue skilled ST to maximize communication of wants/needs and to reduce caregiver burden    Speech Therapy Frequency 2x / week    Duration --   16 weeksor 34 visits   Treatment/Interventions Diet toleration management by SLP;Trials of upgraded texture/liquids;Language facilitation;Internal/external aids;Cognitive reorganization;Cueing hierarchy;SLP instruction and feedback;Patient/family education;Compensatory strategies;Multimodal communcation approach;Functional tasks;Environmental controls    Potential to Achieve Goals Fair    Potential Considerations Severity of impairments;Cooperation/participation level           Patient will benefit from skilled therapeutic intervention in order to improve the following deficits and impairments:   Aphasia  Verbal apraxia    Problem List Patient Active Problem List   Diagnosis Date Noted  . 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 Nontraumatic subcortical hemorrhage of left cerebral hemisphere ([place]) 08/27/2019  . Acute blood loss anemia   . Hypoalbuminemia due to protein-calorie malnutrition ([place])   . Thrombocytopenia ([place])   . Dysphagia 07/29/2019  . Infarction of left basal ganglia ([place]) 07/18/2019  . Hypernatremia   . Leukocytosis   . Essential hypertension   . Global aphasia   . Cytotoxic brain edema ([place]) 07/10/2019  .  ICH (intracerebral hemorrhage) ([place]) 07/09/2019  . Anxiety state 02/21/2015  . Cephalalgia 12/21/2014    Jaivion Kingsley, Annye Rusk MS, CCC-SLP 01/18/2020, 3:21 PM  [facility] [address] [place], [place], [address] Phone: [phone]   Fax:  [phone]   Name: Melanie Madden MRN: [id number] Date of Birth: 1966-06-27

## 2020-01-18 NOTE — Therapy (Signed)
[facility] [address] [place], [place], [address] Phone: [phone]   Fax:  [phone]  Occupational Therapy Treatment  Patient Details  Name: Melanie Madden MRN: [id number] Date of Birth: 1965-09-02 Referring Provider (OT): Dr. Alysia Penna   Encounter Date: 01/18/2020   OT End of Session - 01/18/20 1400    Visit Number 32    Number of Visits 40    Date for OT Re-Evaluation 01/27/20    Authorization Type [employer] 2021:  No prior auth OT, 60 DAY visit limit (regardless of how many therapies each day).    Authorization - Visit Number 10   35 combined   Authorization - Number of Visits 74    OT Start Time 1320    OT Stop Time 1403    OT Time Calculation (min) 43 min    Activity Tolerance Patient tolerated treatment well    Behavior During Therapy Impulsive           Past Medical History:  Diagnosis Date  . Allergy   . Anxiety   . Arthritis   . High triglycerides   . Kidney stones   . Migraines   . Recurrent sinus infections     Past Surgical History:  Procedure Laterality Date  . ANTERIOR CRUCIATE LIGAMENT REPAIR Left   . BUNIONECTOMY Right   . LASIK    . LITHOTRIPSY    . MENISCUS REPAIR Left   . NASAL SEPTUM SURGERY    . SHOULDER SURGERY    . VARICOSE VEIN SURGERY Left     There were no vitals filed for this visit.   Subjective Assessment - 01/18/20 1320    Pertinent History Subcortical hemorrhage of R cerbral hemisphere.  PMH:  HTN, anxiety, migraines, hx of shoulder injection approx 2 weeks ago for R shoulder pain, hx of R shoulder surgery (arthroscopic)    Limitations aphasia, fall risk, impulsive (husband reports hx of OCD)    Special Tests PER HUSBAND for sit>stand:  pt responds to "big push" with knee blocked on both side    Patient Stated Goals husband reports: to be able to care for herself more (pt indicates that she onces to return to previous activities per  questioning/gestures)    Currently in Pain? No/denies           Reviewed previously issued HEP for NMR RUE including wt bearing exercise, self stretch in shoulder flexion supine and lower range seated, and table slides. AA/ROM closed chain low range sh flexion with tilted stool.  NMES x 10 min to wrist/finger extensors, previous parameters.                        OT Short Term Goals - 01/13/20 1301      OT SHORT TERM GOAL #1   Title Pt/husband will be independent with initial HEP for RUE ROM--check STGs 10/14/19    Time 6    Period Weeks    Status Achieved      OT SHORT TERM GOAL #2   Title Pt will perform UB dressing with min A.    Time 6    Period Weeks    Status Achieved   MOD ASSIST.  11/11/19:  pt able to don/doff zip-up fleece mod I.  11/17/19:  met per husband report     OT SHORT TERM GOAL #3   Title Pt will perform UB bathing with mod A.  Time 6    Period Weeks    Status Achieved   min assist for Lt arm     OT SHORT TERM GOAL #4   Title Pt will be able to brush hair with set-up.    Time 4    Period Weeks    Status Achieved    Target Date 12/27/19      OT SHORT TERM GOAL #5   Title Pt will demo at least 110* shoulder flex PROM in supine without pain for incr ease for ADLs.    Time 6    Period Weeks    Status Achieved      OT SHORT TERM GOAL #6   Title Pt will be able to stabilze light object in [place] for low level bilateral reach.--check updated STGs 12/27/19    Time 4    Period Weeks    Status Not Met    Target Date 01/08/20   extend due to decr frequency due to scheduling     OT SHORT TERM GOAL #7   Title Pt will be able to use RUE as a stablizer consistently for selected tasks mod I.    Time 4    Period Weeks    Status Not Met    Target Date 01/08/20             OT Long Term Goals - 01/06/20 1447      OT LONG TERM GOAL #1   Title Pt/husband will be independent with HEP for functional activities for incr participation in  IADLs/leisure tasks (and incorporating RUE if able).--check updated LTGs 01/27/20    Time 8    Period Weeks    Status On-going      OT LONG TERM GOAL #2   Title --      OT LONG TERM GOAL #3   Title --      OT LONG TERM GOAL #4   Title --      OT LONG TERM GOAL #5   Title Pt will perform simple cold snack prep  with min A/set up.    Time 8    Period Weeks    Status Revised   11/24/19:  not performing, not using w/c in the home anymore     OT LONG TERM GOAL #6   Title --      OT LONG TERM GOAL #7   Title Pt will demo at least 20* active shoulder flex in prep for functional low-level reach.    Time 8    Period Weeks    Status New      OT LONG TERM GOAL #8   Title Pt will be able to weight bear through RUE in standing at counter to assist with ADL/IADL tasks.    Time 8    Period Weeks    Status New                 Plan - 01/18/20 1401    Clinical Impression Statement Pt/caregiver w/ greater understanding of HEP and recommendations for carryover at home in prep for d/c next week    Occupational performance deficits (Please refer to evaluation for details): ADL's;IADL's;Leisure;Work;Social Participation    Body Structure / Function / Physical Skills ADL;ROM;IADL;Sensation;Mobility;Strength;Tone;UE functional use;Decreased knowledge of precautions;Decreased knowledge of use of DME;Coordination;Balance;FMC;GMC    Cognitive Skills Attention;Temperament/Personality;Understand;Thought;Safety Awareness    OT Frequency 2x / week    OT Duration 8 weeks    OT Treatment/Interventions Self-care/ADL training;Moist Heat;DME and/or AE instruction;Splinting;Therapeutic  activities;Aquatic Therapy;Cognitive remediation/compensation;Therapeutic exercise;Cryotherapy;Neuromuscular education;Functional Mobility Training;Passive range of motion;Visual/perceptual remediation/compensation;Patient/family education;Manual Therapy;Electrical Stimulation;Fluidtherapy    Plan continue NMR for RUE,  estim, further training in compensatory strategies, d/c end of next week           Patient will benefit from skilled therapeutic intervention in order to improve the following deficits and impairments:   Body Structure / Function / Physical Skills: ADL, ROM, IADL, Sensation, Mobility, Strength, Tone, UE functional use, Decreased knowledge of precautions, Decreased knowledge of use of DME, Coordination, Balance, FMC, GMC Cognitive Skills: Attention, Temperament/Personality, Understand, Thought, Safety Awareness     Visit Diagnosis: 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 Nontraumatic subcortical hemorrhage of left cerebral hemisphere ([facility]) 08/27/2019  . Acute blood loss anemia   . Hypoalbuminemia due to protein-calorie malnutrition ([place])   . Thrombocytopenia ([place])   . Dysphagia 07/29/2019  . Infarction of left basal ganglia ([place]) 07/18/2019  . Hypernatremia   . Leukocytosis   . Essential hypertension   . Global aphasia   . Cytotoxic brain edema ([place]) 07/10/2019  . ICH (intracerebral hemorrhage) ([facility]) 07/09/2019  . Anxiety state 02/21/2015  . Cephalalgia 12/21/2014    Carey Bullocks, OTR/L 01/18/2020, 2:11 PM  [facility] [address] [place], [place], [address] Phone: [phone]   Fax:  [phone]  Name: Melanie Madden MRN: [id number] Date of Birth: 10/28/1965

## 2020-01-18 NOTE — Therapy (Signed)
[facility] [address] [place], [place], [address] Phone: [phone]   Fax:  [phone]  Physical Therapy Treatment  Patient Details  Name: Melanie Madden MRN: [id number] Date of Birth: 1966-01-07 Referring Provider (PT): Lauraine Rinne, [place]   Encounter Date: 01/18/2020   PT End of Session - 01/18/20 1236    Visit Number 36    Number of Visits 54    Date for PT Re-Evaluation 03/29/20    Authorization Type [employer]- 60 VL combined PT/OT/ST - counts as 1 visit if seen on same day.  Not sure if 60 is hard max.  $50 copay each day - make sure PT/OT/ST on same day if possible    Authorization - Visit Number --   PT/OT/ST days combined   Authorization - Number of Visits 60    PT Start Time 1230    PT Stop Time 1317    PT Time Calculation (min) 47 min    Equipment Utilized During Treatment Gait belt   pt's R AFO; dance shoe cover to aid in foot clearance   Activity Tolerance Patient tolerated treatment well    Behavior During Therapy Impulsive           Past Medical History:  Diagnosis Date  . Allergy   . Anxiety   . Arthritis   . High triglycerides   . Kidney stones   . Migraines   . Recurrent sinus infections     Past Surgical History:  Procedure Laterality Date  . ANTERIOR CRUCIATE LIGAMENT REPAIR Left   . BUNIONECTOMY Right   . LASIK    . LITHOTRIPSY    . MENISCUS REPAIR Left   . NASAL SEPTUM SURGERY    . SHOULDER SURGERY    . VARICOSE VEIN SURGERY Left     There were no vitals filed for this visit.   Subjective Assessment - 01/18/20 1236    Subjective Pt denies any falls or changes. Her aide reports that [name] told her she walked a little with nothing with him.    Patient is accompained by: --   Caregivers   Pertinent History anxiety, migraines, HTN    Patient Stated Goals Per husband report they would like her to get stronger on right, improve mobility, and work on speech.     Currently in Pain? No/denies                             [facility] Adult PT Treatment/Exercise - 01/18/20 1238      Transfers   Transfers Sit to Stand;Stand to Sit    Sit to Stand 4: Min guard;4: Min assist    Sit to Stand Details Verbal cues for technique    Sit to Stand Details (indicate cue type and reason) Verbal cues to try to increase right weight shift    Stand to Sit 4: Min assist    Stand to Sit Details Verbal cues to control descent as poor eccentric control when trying to increase right weight shift.      Ambulation/Gait   Ambulation/Gait Yes    Ambulation/Gait Assistance 4: Min guard;3: Mod assist    Ambulation/Gait Assistance Details Pt ambulated first bout with hemiwalker with therapist providing tactile cues at left trunk and right pelvis to try to facilitate more upright posture and right weight shift. Verbal cues to increase step length on left. 2nd bout therapist positioned in front with supporting  RUE  and pt's LUE holding to PTs forearm with PT facilitating at trunk and pelvis to try to encourage more upright posture with less left lateral lean and increased right weight shift. Pt had shortened left step length in this manner and some increased difficulty advancing RLE as PT in front also slowed pt down. Fatigued quicker. Last bout with hemiwalker again after Sci-Fit    Ambulation Distance (Feet) 115 Feet   115' with PT in front holding, 115' x 1   Assistive device Hemi-walker   and hand held assist on therapist's forearms, right AFO   Gait Pattern Step-to pattern;Step-through pattern;Decreased step length - left;Decreased stance time - right;Poor foot clearance - right;Lateral trunk lean to left    Ambulation Surface Level;Indoor      Neuro Re-ed    Neuro Re-ed Details  Sit to stand with 2" step under left foot to facilitate right weight shift and then holding x 20 sec each time with 5 reps. PT providing min assist to facilitate weight shift right and then  in standing min/mod assist as pt did straighten up but leaning almost too far to right. Standing with 2" step under left foot with reaching for 6 cones across body to right x 2 and setting on other side. Min/mod assist at pelvis from PT      Exercises   Exercises Other Exercises      Knee/Hip Exercises: Aerobic   Other Aerobic Sci-Fit x 8 min level 2 with PT stabilizing right hip in neutral                    PT Short Term Goals - 12/30/19 2018      PT SHORT TERM GOAL #1   Title Pt will decrease 5 x sit to stand from 22 sec to <20 sec from mat for improved balance and functional strength.    Baseline 22 sec on 12/30/19    Time 4    Period Weeks    Status New    Target Date 01/29/20      PT SHORT TERM GOAL #2   Title Pt will ambulate with LRAD min assist >300' on level surfaces for improved household mobility with caregiver assist.    Time 4    Period Weeks    Status New    Target Date 01/29/20             PT Long Term Goals - 12/30/19 2021      PT LONG TERM GOAL #1   Title Pt will be able to perform progressive HEP for strengthening, balance and mobility with assist of husband to continued gains at home.    Baseline PT continues to add to HEP    Time 8    Period Weeks    Status On-going    Target Date 02/28/20      PT LONG TERM GOAL #2   Title Pt will increase Berg Balance from 20/56 to >26/ 56 for improved balance.    Baseline 20/56 on 12/30/19    Time 8    Period Weeks    Status New    Target Date 02/28/20      PT LONG TERM GOAL #3   Title Pt will ambulate >400' CGA with LRAD  for improved gait ability in home.    Baseline 230' with RW min/mod assist, 57' with hemiwalker CGA.    Time 8    Period Weeks    Status Revised  Target Date 02/28/20      PT LONG TERM GOAL #4   Title Pt will increase gait speed from 0.64m/s to >0.4m/s for improved gait safety.    Baseline 0.30m/s on 12/30/19    Time 8    Period Weeks    Status New    Target Date 02/28/20                  Plan - 01/18/20 1556    Clinical Impression Statement Pt able to weight shift more to right today with standing activities but needs min/mod assist when gets there as would fall to right without. Continued to work on [employer] with gait to try to promote more upright posture and weight shift.    Personal Factors and Comorbidities Comorbidity 3+    Comorbidities anxiety, migraines, HTN    Examination-Activity Limitations Bathing;Bed Mobility;Stand;Locomotion Level;Squat;Stairs;Dressing;Transfers    Examination-Participation Restrictions Community Activity;Driving;Shop;Laundry;Meal Prep    Stability/Clinical Decision Making Evolving/Moderate complexity    Rehab Potential Good    PT Frequency 2x / week    PT Duration 8 weeks    PT Treatment/Interventions ADLs/Self Care Home Management;Electrical Stimulation;DME Instruction;Gait training;Stair training;Functional mobility training;Therapeutic activities;Therapeutic exercise;Orthotic Fit/Training;Patient/family education;Neuromuscular re-education;Balance training;Manual techniques;Passive range of motion;Vestibular;Wheelchair mobility training    PT Next Visit Plan [place] to front of right shoe to help slide with gait. Continue with activities in standing to facilitate increased right weight shift and R hip flexion/initiation of R swing phase, continue gait with hemiwalker with givmohr sling use.  Pt responded well to short bout on SciFit.  Gait with her UE on your shoulders to get more upright posture, slow her down and get more weight shift.    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 Nontraumatic subcortical hemorrhage of left cerebral hemisphere ([place]) 08/27/2019  . Acute blood loss anemia   . Hypoalbuminemia due to protein-calorie malnutrition ([place])   . Thrombocytopenia ([place])   . Dysphagia 07/29/2019  . Infarction of left basal ganglia ([place]) 07/18/2019  . Hypernatremia   . Leukocytosis   . Essential hypertension   . Global aphasia   . Cytotoxic brain edema ([place]) 07/10/2019  . ICH (intracerebral hemorrhage) ([place]) 07/09/2019  . Anxiety state 02/21/2015  . Cephalalgia 12/21/2014    Electa Sniff, PT, DPT, NCS 01/18/2020, 4:01 PM  [facility] [address] [place], [place], [address] Phone: [phone]   Fax:  [phone]  Name: Melanie Madden MRN: [id number] Date of Birth: [birth year]

## 2020-01-20 ENCOUNTER — Ambulatory Visit: Payer: Managed Care, Other (non HMO)

## 2020-01-20 ENCOUNTER — Other Ambulatory Visit: Payer: Self-pay

## 2020-01-20 ENCOUNTER — Ambulatory Visit: Payer: Managed Care, Other (non HMO) | Admitting: Occupational Therapy

## 2020-01-20 ENCOUNTER — Ambulatory Visit: Payer: Managed Care, Other (non HMO) | Admitting: Speech Pathology

## 2020-01-20 ENCOUNTER — Encounter: Payer: Self-pay | Admitting: Speech Pathology

## 2020-01-20 VITALS — BP 140/84

## 2020-01-20 DIAGNOSIS — R2689 Other abnormalities of gait and mobility: Secondary | ICD-10-CM | POA: Diagnosis not present

## 2020-01-20 DIAGNOSIS — R4701 Aphasia: Secondary | ICD-10-CM

## 2020-01-20 DIAGNOSIS — I69153 Hemiplegia and hemiparesis following nontraumatic intracerebral hemorrhage affecting right non-dominant side: Secondary | ICD-10-CM

## 2020-01-20 DIAGNOSIS — M6281 Muscle weakness (generalized): Secondary | ICD-10-CM

## 2020-01-20 DIAGNOSIS — R41841 Cognitive communication deficit: Secondary | ICD-10-CM

## 2020-01-20 DIAGNOSIS — R482 Apraxia: Secondary | ICD-10-CM

## 2020-01-20 NOTE — Therapy (Signed)
[facility] [address] [place], [place], [address] Phone: [phone]   Fax:  [phone]  Physical Therapy Treatment  Patient Details  Name: Melanie Madden Da Glennie Hawk MRN: [id number] Date of Birth: Dec 01, 1965 Referring Provider (PT): Lauraine Rinne, [place]   Encounter Date: 01/20/2020   PT End of Session - 01/20/20 1152    Visit Number 37    Number of Visits 54    Date for PT Re-Evaluation 03/29/20    Authorization Type [employer]- 60 VL combined PT/OT/ST - counts as 1 visit if seen on same day.  Not sure if 60 is hard max.  $50 copay each day - make sure PT/OT/ST on same day if possible    Authorization - Visit Number --   PT/OT/ST days combined   Authorization - Number of Visits 60    PT Start Time 1148    PT Stop Time 1233    PT Time Calculation (min) 45 min    Equipment Utilized During Treatment Gait belt   pt's R AFO; dance shoe cover to aid in foot clearance   Activity Tolerance Patient tolerated treatment well    Behavior During Therapy Impulsive           Past Medical History:  Diagnosis Date  . Allergy   . Anxiety   . Arthritis   . High triglycerides   . Kidney stones   . Migraines   . Recurrent sinus infections     Past Surgical History:  Procedure Laterality Date  . ANTERIOR CRUCIATE LIGAMENT REPAIR Left   . BUNIONECTOMY Right   . LASIK    . LITHOTRIPSY    . MENISCUS REPAIR Left   . NASAL SEPTUM SURGERY    . SHOULDER SURGERY    . VARICOSE VEIN SURGERY Left     Vitals:   01/20/20 1212  BP: 140/84     Subjective Assessment - 01/20/20 1152    Subjective Pt denies any new issues. Denies any falls.    Patient is accompained by: --   Caregivers   Pertinent History anxiety, migraines, HTN    Patient Stated Goals Per husband report they would like her to get stronger on right, improve mobility, and work on speech.    Currently in Pain? Yes    Pain Location Foot    Pain Orientation Right     Aggravating Factors  pt unable to describe due to apraxia but was tearful at times pointing to whole right foot.                             [facility] Adult PT Treatment/Exercise - 01/20/20 1153      Bed Mobility   Bed Mobility Supine to Sit;Sit to Supine    Supine to Sit Supervision/Verbal cueing    Sit to Supine Minimal Assistance - Patient > 75%   at right leg with pt also assisting to lift with hand     Transfers   Transfers Sit to Stand;Stand to Sit    Sit to Stand 4: Min guard    Sit to Stand Details Manual facilitation for weight shifting    Stand to Sit 4: Min guard    Stand to Sit Details (indicate cue type and reason) Manual facilitation for weight shifting      Ambulation/Gait   Ambulation/Gait Yes    Ambulation/Gait Assistance 5: Supervision;4: Min guard    Ambulation/Gait Assistance Details Into  clinic    Ambulation Distance (Feet) 50 Feet    Assistive device Hemi-walker    Gait Pattern Step-to pattern;Step-through pattern;Decreased hip/knee flexion - right;Decreased stance time - right;Decreased weight shift to right;Poor foot clearance - right;Lateral trunk lean to left    Ambulation Surface Level;Indoor      Therapeutic Activites    Therapeutic Activities Other Therapeutic Activities    Other Therapeutic Activities PT did remove AFO and both shoes and socks to check feet as patient tearful and grabbing around feet when sat down as arrived in session. No redness noted where brace is. Pt has slight increased swelling on right foot/ankle but no different than prior session. Pt also colder in right foot which is no change. Pulses intact bilateral. Did not display pain reactions with palpation of feet. No brusing noted. No displays of pain with PROM in all directions to right ankle. Did advise pt and caregiver to have PCP check if continues to bother her more. Seemed to bother her more with legs in dependent position but no complaints in standing with  activities or when walking in. No swelling or redness in calves and denied any pain there.      Neuro Re-ed    Neuro Re-ed Details  Standing weight shifting to the right with mirror in front for visual cues x 6 with PT assisting to facilitate weight shift to right at pelvis and tactile cues at knee to try to get more quad activation. Standing with left hand on PT shoulder to try to facilitate trunk elongation on left with weight shifting then having patient step forwards slightly in staggered stance with left foot working on ant/post weight shift.      Exercises   Exercises Other Exercises    Other Exercises  Supine exercises: Bridges x 10 with PT stabilizing right hip to keep in neutral, hooklying trying to maintain right hip position then moving right knee in/out through small range to targets with PT hand x 5. Less control with eccentric adduction. Right hip/knee flexion x 10 with leg on red theraball and PT stabilizing leg min assist for flexion and light resistance with extension. Pt was utilized posterior pelvic tilt to help try to initiate hip flexion. Pt did not complain of any pain in foot with exercises. When sat back up with feet down she started to grab at both feet again.                  PT Education - 01/20/20 1814    Education Details Suggested pt see PCP about pain in feet if continues to the extent of making her tearful. Did not seem to be emergent issue with no signs of DVT. Difficult to completely assess due to pt being nonverbal and not consistent with yes/no answers.    Person(s) Educated Patient    Methods Explanation    Comprehension Verbalized understanding            PT Short Term Goals - 12/30/19 2018      PT SHORT TERM GOAL #1   Title Pt will decrease 5 x sit to stand from 22 sec to <20 sec from mat for improved balance and functional strength.    Baseline 22 sec on 12/30/19    Time 4    Period Weeks    Status New    Target Date 01/29/20      PT SHORT  TERM GOAL #2   Title Pt will ambulate with LRAD min assist >300'  on level surfaces for improved household mobility with caregiver assist.    Time 4    Period Weeks    Status New    Target Date 01/29/20             PT Long Term Goals - 12/30/19 2021      PT LONG TERM GOAL #1   Title Pt will be able to perform progressive HEP for strengthening, balance and mobility with assist of husband to continued gains at home.    Baseline PT continues to add to HEP    Time 8    Period Weeks    Status On-going    Target Date 02/28/20      PT LONG TERM GOAL #2   Title Pt will increase Berg Balance from 20/56 to >26/ 56 for improved balance.    Baseline 20/56 on 12/30/19    Time 8    Period Weeks    Status New    Target Date 02/28/20      PT LONG TERM GOAL #3   Title Pt will ambulate >400' CGA with LRAD  for improved gait ability in home.    Baseline 230' with RW min/mod assist, 34' with hemiwalker CGA.    Time 8    Period Weeks    Status Revised    Target Date 02/28/20      PT LONG TERM GOAL #4   Title Pt will increase gait speed from 0.19m/s to >0.105m/s for improved gait safety.    Baseline 0.42m/s on 12/30/19    Time 8    Period Weeks    Status New    Target Date 02/28/20                 Plan - 01/20/20 1816    Clinical Impression Statement Pt was more tearful and appeared frustrated upon arriving to session after she got settled in grabbing at feet. No outward signs of injury when PT inspected skin. Did instruct pt and caregiver to have PCP check if continues to be issue. No complaints of pain when standing or with exercises once got going. Continued to work on try to increase right weight shift and elongate left trunk.    Personal Factors and Comorbidities Comorbidity 3+    Comorbidities anxiety, migraines, HTN    Examination-Activity Limitations Bathing;Bed Mobility;Stand;Locomotion Level;Squat;Stairs;Dressing;Transfers    Examination-Participation Restrictions Community  Activity;Driving;Shop;Laundry;Meal Prep    Stability/Clinical Decision Making Evolving/Moderate complexity    Rehab Potential Good    PT Frequency 2x / week    PT Duration 8 weeks    PT Treatment/Interventions ADLs/Self Care Home Management;Electrical Stimulation;DME Instruction;Gait training;Stair training;Functional mobility training;Therapeutic activities;Therapeutic exercise;Orthotic Fit/Training;Patient/family education;Neuromuscular re-education;Balance training;Manual techniques;Passive range of motion;Vestibular;Wheelchair mobility training    PT Next Visit Plan How are feet doing? Continue with activities in standing to facilitate increased right weight shift and R hip flexion/initiation of R swing phase, continue gait with hemiwalker with givmohr sling use.  Pt responded well to short bout on SciFit.  Gait with her UE on your shoulders to get more upright posture, slow her down and get more weight shift.    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 Nontraumatic subcortical hemorrhage of left cerebral hemisphere ([place]) 08/27/2019  . Acute blood loss anemia   . Hypoalbuminemia due to protein-calorie malnutrition ([place])   .  Thrombocytopenia ([place])   . Dysphagia 07/29/2019  . Infarction of left basal ganglia ([place]) 07/18/2019  . Hypernatremia   . Leukocytosis   . Essential hypertension   . Global aphasia   . Cytotoxic brain edema ([place]) 07/10/2019  . ICH (intracerebral hemorrhage) ([place]) 07/09/2019  . Anxiety state 02/21/2015  . Cephalalgia 12/21/2014    Electa Sniff, PT, DPT, NCS 01/20/2020, 6:19 PM  [facility] [address] [place], [place], [address] Phone: [phone]   Fax:  [phone]  Name: Bethanny Toelle Da Glennie Hawk MRN: [id number] Date of Birth: Feb 10, 1966

## 2020-01-20 NOTE — Patient Instructions (Signed)
     Ni  Landry Mellow  You need to go slow and exaggerate the N, such as [employer] in 2 distinct syllalbes  NI  [employer]!!!!  Use the mirror NI

## 2020-01-20 NOTE — Therapy (Signed)
[facility] [address] [place], [place], [address] Phone: [phone]   Fax:  [phone]  Speech Language Pathology Treatment  Patient Details  Name: Melanie Madden MRN: [id number] Date of Birth: [birth year] Referring Provider (SLP): Alysia Penna, MD   Encounter Date: 01/20/2020   End of Session - 01/20/20 1505    Visit Number 32    Number of Visits 41    Date for SLP Re-Evaluation 02/26/20    Authorization Type cigna    Authorization Time Period 60 DAYS of therapy regardless of number disciplines per day    SLP Start Time 5681    SLP Stop Time  1359    SLP Time Calculation (min) 41 min    Activity Tolerance Patient tolerated treatment well           Past Medical History:  Diagnosis Date  . Allergy   . Anxiety   . Arthritis   . High triglycerides   . Kidney stones   . Migraines   . Recurrent sinus infections     Past Surgical History:  Procedure Laterality Date  . ANTERIOR CRUCIATE LIGAMENT REPAIR Left   . BUNIONECTOMY Right   . LASIK    . LITHOTRIPSY    . MENISCUS REPAIR Left   . NASAL SEPTUM SURGERY    . SHOULDER SURGERY    . VARICOSE VEIN SURGERY Left     There were no vitals filed for this visit.   Subjective Assessment - 01/20/20 1321    Subjective Shakes head no when asked if she wants me to make more videos    Currently in Pain? No/denies                 ADULT SLP TREATMENT - 01/20/20 1321      General Information   Behavior/Cognition Alert;Cooperative;Impulsive;Requires cueing      Cognitive-Linquistic Treatment   Treatment focused on Aphasia;Apraxia;Patient/family/caregiver education    Skilled Treatment Melanie Madden does not wish to make more videos today. We targeted her name, which she approximated with consistent mod to max A in syllables 70% of trials.  Attempted Melo in syllables, however m/n substitution consistently. Melanie Madden progressed to say her name  independently 4x at end of session. Trained aide is using syllables, slow rate and exaggerating the /n/ to slow her down. Targeted "You too" at phoneme level. Melanie Madden produced "you" with frerquent mod to max A. Consistent max A for  "you too"      Assessment / Recommendations / Plan   Plan Continue with current plan of care      Progression Toward Goals   Progression toward goals Progressing toward goals            SLP Education - 01/20/20 1503    Education Details go slow, use syllables and exaggerate 1 st sound    Person(s) Educated Patient;Caregiver(s)    Methods Explanation;Demonstration;Verbal cues;Handout    Comprehension Returned demonstration;Verbal cues required;Need further instruction            SLP Short Term Goals - 01/20/20 1505      SLP SHORT TERM GOAL #1   Title pt will simultaneously with SLP produce 3 different phonemes 50% of the time over three sessions    Status Partially Met      SLP SHORT TERM GOAL #2   Title pt will ID objects salient to pt in f:4 80% of the time over 3 sessions    Status Deferred  SLP SHORT TERM GOAL #3   Title pt will write her full name on first attempt over three sessions    Time 1    Status Deferred      SLP SHORT TERM GOAL #4   Title pt will attempt expressive communication via multimodal means during 5 sessions    Status Not Met      SLP SHORT TERM GOAL #5   Title pt will demonstrate undertanding of simple 1-step functional commands with 70% accuracy with multimodal cues over 3 sessions    Status Not Met      SLP SHORT TERM GOAL #6   Title pt will look at object or line drawing and match the correct word from f:6 words 90% success over 3 sessions    Status Deferred      SLP SHORT TERM GOAL #7   Title pt will demo knowledge of pertinent /functional information placement on speech generating device 75% with rare min A    Time 4    Period Weeks    Status Achieved      SLP SHORT TERM GOAL #8   Title pt will  demonstrate how to generate a new icon with written cues for procedure (max A for spelling) x3 sessions    Time 3    Period Weeks    Status On-going      SLP SHORT TERM GOAL  #9   TITLE pt will communicate simple information (places, people, food, etc) using Lingraphica with occasional min a for navigation x3 sessions    Time 3    Period Weeks    Status Achieved            SLP Long Term Goals - 01/20/20 1505      SLP LONG TERM GOAL #1   Title pt's diet will be appropriately upgraded as needed by SLP    Period --   or 25 total visits, for all LTGs   Status Deferred   pt eating preferred diet without overt s/sx aspiration PNA     SLP LONG TERM GOAL #2   Title pt will produce 3 simple words/words pertinent to pt (family or pet names, etc) simultaneously with SLP and multimodal cues over three sessions    Status Not Met      SLP LONG TERM GOAL #3   Title pt will demo understanding of simple yes/no questions pertinent to desires/wants with multimodal cues 90% of the time over 3 sessions    Status Not Met      SLP LONG TERM GOAL #4   Title a device trial with a speech generating device will be initiated within the first 20 ST visits    Status Achieved      SLP LONG TERM GOAL #5   Title pt will demo recognition of written word for a common object 100% success over 3 sessions    Baseline 11-13-19, 11-20-19    Status Achieved      SLP LONG TERM GOAL #6   Title pt will work on speech-related tasks at home between 75% of sessions as reported by family and/or noted on [place] website    Baseline 10-21-19, 10-26-19, 10-28-19, 11-11-19, 11-13-19, 11-20-19, 11-24-19, 11-27-19    Status Partially Met      SLP LONG TERM GOAL #7   Title pt will demo knowledge of specific pertinent /functional information placement on speech generating device 90%    Time 5    Period Weeks   or  41 total sessions, for all LTGs   Status On-going      SLP LONG TERM GOAL #8   Title pt will demonstrate  how to generate a new icon with occasional min A for procedure (max A for spelling) x3 sessions    Time 7    Period Weeks    Status On-going      SLP LONG TERM GOAL  #9   TITLE pt will communicate feelings or other personal mod complex information using Lingraphica with rare min a for navigation x3 sessions    Time 7    Period Weeks    Status On-going            Plan - 01/20/20 1504    Clinical Impression Statement Pt expressive language and speech cont non functional. She continues to demonstrate deficits of attention, awareness, impulsivity. Severe aphasia and verbal apraxia. Ongoing training in AAC device and multimodal communication. Low frustration tolerance affects progress in ST. Continue skilled ST to maximize communication of wants/needs and to reduce caregiver burden    Speech Therapy Frequency 2x / week    Duration --   16 weeks or 34 visits   Treatment/Interventions Diet toleration management by SLP;Trials of upgraded texture/liquids;Language facilitation;Internal/external aids;Cognitive reorganization;Cueing hierarchy;SLP instruction and feedback;Patient/family education;Compensatory strategies;Multimodal communcation approach;Functional tasks;Environmental controls    Potential to Achieve Goals Fair    Potential Considerations Severity of impairments;Cooperation/participation level           Patient will benefit from skilled therapeutic intervention in order to improve the following deficits and impairments:   Aphasia  Verbal apraxia  Cognitive communication deficit    Problem List Patient Active Problem List   Diagnosis Date Noted  . 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 Nontraumatic subcortical hemorrhage of left cerebral hemisphere ([place]) 08/27/2019  . Acute blood loss anemia   . Hypoalbuminemia due to protein-calorie malnutrition ([place])   . Thrombocytopenia ([place])   . Dysphagia 07/29/2019  . Infarction of left  basal ganglia ([place]) 07/18/2019  . Hypernatremia   . Leukocytosis   . Essential hypertension   . Global aphasia   . Cytotoxic brain edema ([place]) 07/10/2019  . ICH (intracerebral hemorrhage) ([place]) 07/09/2019  . Anxiety state 02/21/2015  . Cephalalgia 12/21/2014    Caitlynn Ju, Annye Rusk MS, CCC-SLP 01/20/2020, 3:06 PM  [facility] [address] [place], [place], [address] Phone: [phone]   Fax:  [phone]   Name: Melanie Madden MRN: [id number] Date of Birth: [birth year]

## 2020-01-20 NOTE — Patient Instructions (Signed)
   TENS DEVICE PARAMETERS FOR WRIST AND FINGER OPENING:   Mode: EMS -S Ramp: 2 On time: 10  Off time: 10 Rate: 50 Hz Width: 250 [place] Time: 15 or 20 minutes  Recommend 2 to 3 times per day Mode button to get mode Set button to change between parameters Time button to set time **NEVER TAKE ELECTRODES OFF WITH DEVICE ON, TURN OFF FIRST **ONLY TURN UP DURING "ON" CYCLE

## 2020-01-20 NOTE — Therapy (Signed)
[facility] [address] [place], [place], [address] Phone: [phone]   Fax:  [phone]  Occupational Therapy Treatment  Patient Details  Name: Melanie Madden MRN: [id number] Date of Birth: 1966/05/10 Referring Provider (OT): Dr. Alysia Penna   Encounter Date: 01/20/2020   OT End of Session - 01/20/20 1315    Visit Number 33    Number of Visits 40    Date for OT Re-Evaluation 01/27/20    Authorization Type Cigna 2021:  No prior auth OT, 60 DAY visit limit (regardless of how many therapies each day).    Authorization - Visit Number 77    Authorization - Number of Visits 68    OT Start Time 5400    OT Stop Time 1315    OT Time Calculation (min) 40 min    Behavior During Therapy Impulsive           Past Medical History:  Diagnosis Date  . Allergy   . Anxiety   . Arthritis   . High triglycerides   . Kidney stones   . Migraines   . Recurrent sinus infections     Past Surgical History:  Procedure Laterality Date  . ANTERIOR CRUCIATE LIGAMENT REPAIR Left   . BUNIONECTOMY Right   . LASIK    . LITHOTRIPSY    . MENISCUS REPAIR Left   . NASAL SEPTUM SURGERY    . SHOULDER SURGERY    . VARICOSE VEIN SURGERY Left     There were no vitals filed for this visit.   Subjective Assessment - 01/20/20 1303    Pertinent History Subcortical hemorrhage of R cerbral hemisphere.  PMH:  HTN, anxiety, migraines, hx of shoulder injection approx 2 weeks ago for R shoulder pain, hx of R shoulder surgery (arthroscopic)    Limitations aphasia, fall risk, impulsive (husband reports hx of OCD)    Special Tests PER HUSBAND for sit>stand:  pt responds to "big push" with knee blocked on both side    Patient Stated Goals husband reports: to be able to care for herself more (pt indicates that she onces to return to previous activities per questioning/gestures)    Currently in Pain? No/denies           Discussed proper positioning and donning/doffing of Giv Mohr sling with caregiver upon request. Pt's caregiver also brought in home NMES device and asked for written instructions to set up parameters - therapist provided and reviewed (see pt instructions).  Standing: wt shifts as able to Rt side (limited) w/ tactile and demo cueing needed and support to Rt knee - light wt bearing through RUE w/ min assist. Retrieving items out of cabinets LUE for functional reaching Seated: wt bearing over Rt elbow w/ mild wt shifts and reaching LUE w/ therapist providing min assist.  NMES x 10 min to wrist and finger extensors (previous parameters)                       OT Education - 01/20/20 1312    Education Details NMES Home device parameters/instructions (pt's husband lost original instructions)    Person(s) Educated Patient;Caregiver(s)    Methods Explanation;Handout    Comprehension Verbalized understanding            OT Short Term Goals - 01/13/20 1301      OT SHORT TERM GOAL #1   Title Pt/husband will be independent with initial HEP for RUE ROM--check STGs 10/14/19  Time 6    Period Weeks    Status Achieved      OT SHORT TERM GOAL #2   Title Pt will perform UB dressing with min A.    Time 6    Period Weeks    Status Achieved   MOD ASSIST.  11/11/19:  pt able to don/doff zip-up fleece mod I.  11/17/19:  met per husband report     OT SHORT TERM GOAL #3   Title Pt will perform UB bathing with mod A.    Time 6    Period Weeks    Status Achieved   min assist for Lt arm     OT SHORT TERM GOAL #4   Title Pt will be able to brush hair with set-up.    Time 4    Period Weeks    Status Achieved    Target Date 12/27/19      OT SHORT TERM GOAL #5   Title Pt will demo at least 110* shoulder flex PROM in supine without pain for incr ease for ADLs.    Time 6    Period Weeks    Status Achieved      OT SHORT TERM GOAL #6   Title Pt will be able to stabilze light object  in [place] for low level bilateral reach.--check updated STGs 12/27/19    Time 4    Period Weeks    Status Not Met    Target Date 01/08/20   extend due to decr frequency due to scheduling     OT SHORT TERM GOAL #7   Title Pt will be able to use RUE as a stablizer consistently for selected tasks mod I.    Time 4    Period Weeks    Status Not Met    Target Date 01/08/20             OT Long Term Goals - 01/06/20 1447      OT LONG TERM GOAL #1   Title Pt/husband will be independent with HEP for functional activities for incr participation in IADLs/leisure tasks (and incorporating RUE if able).--check updated LTGs 01/27/20    Time 8    Period Weeks    Status On-going      OT LONG TERM GOAL #2   Title --      OT LONG TERM GOAL #3   Title --      OT LONG TERM GOAL #4   Title --      OT LONG TERM GOAL #5   Title Pt will perform simple cold snack prep  with min A/set up.    Time 8    Period Weeks    Status Revised   11/24/19:  not performing, not using w/c in the home anymore     OT LONG TERM GOAL #6   Title --      OT LONG TERM GOAL #7   Title Pt will demo at least 20* active shoulder flex in prep for functional low-level reach.    Time 8    Period Weeks    Status New      OT LONG TERM GOAL #8   Title Pt will be able to weight bear through RUE in standing at counter to assist with ADL/IADL tasks.    Time 8    Period Weeks    Status New                 Plan - 01/20/20  1429    Occupational performance deficits (Please refer to evaluation for details): ADL's;IADL's;Leisure;Work;Social Participation    Body Structure / Function / Physical Skills ADL;ROM;IADL;Sensation;Mobility;Strength;Tone;UE functional use;Decreased knowledge of precautions;Decreased knowledge of use of DME;Coordination;Balance;FMC;GMC    Cognitive Skills Attention;Temperament/Personality;Understand;Thought;Safety Awareness    OT Frequency 2x / week    OT Duration 8 weeks    OT  Treatment/Interventions Self-care/ADL training;Moist Heat;DME and/or AE instruction;Splinting;Therapeutic activities;Aquatic Therapy;Cognitive remediation/compensation;Therapeutic exercise;Cryotherapy;Neuromuscular education;Functional Mobility Training;Passive range of motion;Visual/perceptual remediation/compensation;Patient/family education;Manual Therapy;Electrical Stimulation;Fluidtherapy    Plan Check remaining goals and d/c next week    Consulted and Agree with Plan of Care Patient;Other (Comment)    Family Member Consulted caregiver           Patient will benefit from skilled therapeutic intervention in order to improve the following deficits and impairments:   Body Structure / Function / Physical Skills: ADL, ROM, IADL, Sensation, Mobility, Strength, Tone, UE functional use, Decreased knowledge of precautions, Decreased knowledge of use of DME, Coordination, Balance, FMC, GMC Cognitive Skills: Attention, Temperament/Personality, Understand, Thought, Safety Awareness     Visit Diagnosis: 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 Nontraumatic subcortical hemorrhage of left cerebral hemisphere ([place]) 08/27/2019  . Acute blood loss anemia   . Hypoalbuminemia due to protein-calorie malnutrition ([place])   . Thrombocytopenia ([place])   . Dysphagia 07/29/2019  . Infarction of left basal ganglia ([place]) 07/18/2019  . Hypernatremia   . Leukocytosis   . Essential hypertension   . Global aphasia   . Cytotoxic brain edema ([place]) 07/10/2019  . ICH (intracerebral hemorrhage) ([place]) 07/09/2019  . Anxiety state 02/21/2015  . Cephalalgia 12/21/2014    Carey Bullocks, OTR/L 01/20/2020, 2:30 PM  [facility] [address] [place], [place], [address] Phone: [phone]   Fax:  [phone]  Name: Melanie Madden MRN: [id number] Date of Birth: 09-Feb-1966

## 2020-01-25 ENCOUNTER — Ambulatory Visit: Payer: Managed Care, Other (non HMO) | Admitting: Speech Pathology

## 2020-01-25 ENCOUNTER — Encounter: Payer: Self-pay | Admitting: Physical Therapy

## 2020-01-25 ENCOUNTER — Ambulatory Visit: Payer: Managed Care, Other (non HMO) | Admitting: Physical Therapy

## 2020-01-25 ENCOUNTER — Other Ambulatory Visit: Payer: Self-pay

## 2020-01-25 ENCOUNTER — Ambulatory Visit: Payer: Managed Care, Other (non HMO) | Admitting: Occupational Therapy

## 2020-01-25 DIAGNOSIS — M6281 Muscle weakness (generalized): Secondary | ICD-10-CM

## 2020-01-25 DIAGNOSIS — R2689 Other abnormalities of gait and mobility: Secondary | ICD-10-CM | POA: Diagnosis not present

## 2020-01-25 DIAGNOSIS — I69153 Hemiplegia and hemiparesis following nontraumatic intracerebral hemorrhage affecting right non-dominant side: Secondary | ICD-10-CM

## 2020-01-25 DIAGNOSIS — R4701 Aphasia: Secondary | ICD-10-CM

## 2020-01-25 DIAGNOSIS — R41841 Cognitive communication deficit: Secondary | ICD-10-CM

## 2020-01-25 DIAGNOSIS — R482 Apraxia: Secondary | ICD-10-CM

## 2020-01-25 NOTE — Therapy (Signed)
[facility] [address] [place], [place], [address] Phone: [phone]   Fax:  [phone]  Occupational Therapy Treatment  Patient Details  Name: Melanie Madden MRN: [id number] Date of Birth: April 10, 1966 Referring Provider (OT): Dr. Alysia Penna   Encounter Date: 01/25/2020   OT End of Session - 01/25/20 1220    Visit Number 34    Number of Visits 40    Date for OT Re-Evaluation 01/27/20    Authorization Type Cigna 2021:  No prior auth OT, 60 DAY visit limit (regardless of how many therapies each day).    Authorization - Visit Number 40    Authorization - Number of Visits 6    OT Start Time 1100    OT Stop Time 1145    OT Time Calculation (min) 45 min    Activity Tolerance Patient tolerated treatment well    Behavior During Therapy Impulsive           Past Medical History:  Diagnosis Date  . Allergy   . Anxiety   . Arthritis   . High triglycerides   . Kidney stones   . Migraines   . Recurrent sinus infections     Past Surgical History:  Procedure Laterality Date  . ANTERIOR CRUCIATE LIGAMENT REPAIR Left   . BUNIONECTOMY Right   . LASIK    . LITHOTRIPSY    . MENISCUS REPAIR Left   . NASAL SEPTUM SURGERY    . SHOULDER SURGERY    . VARICOSE VEIN SURGERY Left     There were no vitals filed for this visit.    Assessed LTG's and progress to date with patient, caregiver, and husband.  Reviewed proper placement of Giv Mohr sling with husband and reviewed HEP and precautions, recommendations with HEP and ADL's. Discussed fall prevention techniques. Husband also concerned about pt's behavioral deficits and pt easily getting frustrated/refusing to do things - recommended neuro psychology consult.                        OT Short Term Goals - 01/13/20 1301      OT SHORT TERM GOAL #1   Title Pt/husband will be independent with initial HEP for RUE ROM--check STGs  10/14/19    Time 6    Period Weeks    Status Achieved      OT SHORT TERM GOAL #2   Title Pt will perform UB dressing with min A.    Time 6    Period Weeks    Status Achieved   MOD ASSIST.  11/11/19:  pt able to don/doff zip-up fleece mod I.  11/17/19:  met per husband report     OT SHORT TERM GOAL #3   Title Pt will perform UB bathing with mod A.    Time 6    Period Weeks    Status Achieved   min assist for Lt arm     OT SHORT TERM GOAL #4   Title Pt will be able to brush hair with set-up.    Time 4    Period Weeks    Status Achieved    Target Date 12/27/19      OT SHORT TERM GOAL #5   Title Pt will demo at least 110* shoulder flex PROM in supine without pain for incr ease for ADLs.    Time 6    Period Weeks    Status Achieved  OT SHORT TERM GOAL #6   Title Pt will be able to stabilze light object in BUEs for low level bilateral reach.--check updated STGs 12/27/19    Time 4    Period Weeks    Status Not Met    Target Date 01/08/20   extend due to decr frequency due to scheduling     OT SHORT TERM GOAL #7   Title Pt will be able to use RUE as a stablizer consistently for selected tasks mod I.    Time 4    Period Weeks    Status Not Met    Target Date 01/08/20             OT Long Term Goals - 01/25/20 1107      OT LONG TERM GOAL #1   Title Pt/husband will be independent with HEP for functional activities for incr participation in IADLs/leisure tasks (and incorporating RUE if able).--check updated LTGs 01/27/20    Time 8    Period Weeks    Status Deferred   unable to progress     OT LONG TERM GOAL #2   Title Pt will perform UB dressing with supervision.    Time 12    Period Weeks    Status Achieved   11/24/19     OT LONG TERM GOAL #3   Title Pt will perform LB dressing with mod A.    Time 12    Period Weeks    Status Achieved   11/17/19:  min A per husband report     OT LONG TERM GOAL #4   Title Pt will perform UB bathing with min A.    Time 12     Period Weeks    Status Achieved   11/17/19:  met per husband report     OT LONG TERM GOAL #5   Title Pt will perform simple cold snack prep  with min A/set up.    Time 8    Period Weeks    Status Achieved   from w/c level only - can stand to get items from cabinet with direct supervision     OT LONG TERM GOAL #6   Title Pt will perform toileting with mod A.    Time 12    Period Weeks    Status Achieved   11/24/19:  CGA/close supervision for ambulation/standing per aide report     OT LONG TERM GOAL #7   Title Pt will demo at least 20* active shoulder flex in prep for functional low-level reach.    Time 8    Period Weeks    Status Not Met      OT LONG TERM GOAL #8   Title Pt will be able to weight bear through RUE in standing at counter to assist with ADL/IADL tasks.    Time 8    Period Weeks    Status Partially Met   only with supervision, min facilitation                Plan - 01/25/20 1218    Clinical Impression Statement Assessed goals (see goal section) and provided family education today    Occupational performance deficits (Please refer to evaluation for details): ADL's;IADL's;Leisure;Work;Social Participation    Body Structure / Function / Physical Skills ADL;ROM;IADL;Sensation;Mobility;Strength;Tone;UE functional use;Decreased knowledge of precautions;Decreased knowledge of use of DME;Coordination;Balance;FMC;GMC    Cognitive Skills Attention;Temperament/Personality;Understand;Thought;Safety Awareness    OT Frequency 2x / week    OT Duration 8 weeks  OT Treatment/Interventions Self-care/ADL training;Moist Heat;DME and/or AE instruction;Splinting;Therapeutic activities;Aquatic Therapy;Cognitive remediation/compensation;Therapeutic exercise;Cryotherapy;Neuromuscular education;Functional Mobility Training;Passive range of motion;Visual/perceptual remediation/compensation;Patient/family education;Manual Therapy;Electrical Stimulation;Fluidtherapy    Plan try holding and  passing ball bilaterally for RUE activation, d/c next session    Consulted and Agree with Plan of Care Patient;Family member/caregiver    Family Member Consulted Husband [name])           Patient will benefit from skilled therapeutic intervention in order to improve the following deficits and impairments:   Body Structure / Function / Physical Skills: ADL, ROM, IADL, Sensation, Mobility, Strength, Tone, UE functional use, Decreased knowledge of precautions, Decreased knowledge of use of DME, Coordination, Balance, FMC, GMC Cognitive Skills: Attention, Temperament/Personality, Understand, Thought, Safety Awareness     Visit Diagnosis: 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 Nontraumatic subcortical hemorrhage of left cerebral hemisphere ([place]) 08/27/2019  . Acute blood loss anemia   . Hypoalbuminemia due to protein-calorie malnutrition ([place])   . Thrombocytopenia ([place])   . Dysphagia 07/29/2019  . Infarction of left basal ganglia ([place]) 07/18/2019  . Hypernatremia   . Leukocytosis   . Essential hypertension   . Global aphasia   . Cytotoxic brain edema ([place]) 07/10/2019  . ICH (intracerebral hemorrhage) ([place]) 07/09/2019  . Anxiety state 02/21/2015  . Cephalalgia 12/21/2014    Carey Bullocks, OTR/L 01/25/2020, 12:22 PM  [facility] [address] [place], [place], [address] Phone: [phone]   Fax:  [phone]  Name: Melanie Madden MRN: [id number] Date of Birth: 20-May-1966

## 2020-01-25 NOTE — Patient Instructions (Signed)
   Dr. Sima Matas - neuropsychologist  Depression, anger is lashing out  Awareness is poor  Use the Lingraphica device  Participation in therapy has declined

## 2020-01-26 ENCOUNTER — Encounter: Payer: Self-pay | Admitting: Speech Pathology

## 2020-01-26 NOTE — Therapy (Signed)
[facility] [address] [place], [place], [address] Phone: [phone]   Fax:  [phone]  Speech Language Pathology Treatment  Patient Details  Name: Melanie Madden MRN: [id number] Date of Birth: 09/14/1965 Referring Provider (SLP): Alysia Penna, MD   Encounter Date: 01/25/2020   End of Session - 01/26/20 1254    Visit Number 33    Number of Visits 41    Date for SLP Re-Evaluation 02/26/20    SLP Start Time 5329    SLP Stop Time  1100    SLP Time Calculation (min) 45 min    Activity Tolerance Other (comment)   tearful throughout          Past Medical History:  Diagnosis Date  . Allergy   . Anxiety   . Arthritis   . High triglycerides   . Kidney stones   . Migraines   . Recurrent sinus infections     Past Surgical History:  Procedure Laterality Date  . ANTERIOR CRUCIATE LIGAMENT REPAIR Left   . BUNIONECTOMY Right   . LASIK    . LITHOTRIPSY    . MENISCUS REPAIR Left   . NASAL SEPTUM SURGERY    . SHOULDER SURGERY    . VARICOSE VEIN SURGERY Left     There were no vitals filed for this visit.   Subjective Assessment - 01/25/20 1050    Subjective Shakes head no to aide or [name] coming in the treatment room    Currently in Pain? No/denies                 ADULT SLP TREATMENT - 01/25/20 1050      General Information   Behavior/Cognition Alert;Cooperative;Impulsive;Requires cueing      Treatment Provided   Treatment provided Cognitive-Linquistic      Cognitive-Linquistic Treatment   Treatment focused on Aphasia;Apraxia;Patient/family/caregiver education    Skilled Treatment Jennel continues to want to work on speech. She rerquired visual cues and clues to slow down when saying her name. When she attempts her name with normal rate, she reverts to /da do/. With cues she is able to apprioximate /ni ko/ 80% of trials. We attempted [facility] in syllables. Marium required max A  for bilabial nasal today. Upon distraction or increase in rate, /m/ reverted to /n/. Attempted to make phoneme /l/ with max verbval cues, visula cues including mirror and my modeling, Halima produce /l/ 60% of trials. She is again tearful today. At the end of the session, [name] came in with questions about how best to help [place]. I encouraged him to attempt communication with the [employer]. I explained that Libertie's depression and anxiety and lack of awraeness are hindering her communication. We discussed lettingn Starla have a break from [place] at this time and returning in 6 months ir if/when Kenzey wants to.       Assessment / Recommendations / Plan   Plan Continue with current plan of care      Progression Toward Goals   Progression toward goals Not progressing toward goals (comment)   depression, anxiety and cooperation hindeirng progress             SLP Short Term Goals - 01/26/20 1253      SLP SHORT TERM GOAL #1   Title pt will simultaneously with SLP produce 3 different phonemes 50% of the time over three sessions    Status Partially Met      SLP SHORT TERM GOAL #  2   Title pt will ID objects salient to pt in f:4 80% of the time over 3 sessions    Status Deferred      SLP [place] #3   Title pt will write her full name on first attempt over three sessions    Time 1    Status Deferred      SLP SHORT TERM GOAL #4   Title pt will attempt expressive communication via multimodal means during 5 sessions    Status Not Met      SLP SHORT TERM GOAL #5   Title pt will demonstrate undertanding of simple 1-step functional commands with 70% accuracy with multimodal cues over 3 sessions    Status Not Met      SLP SHORT TERM GOAL #6   Title pt will look at object or line drawing and match the correct word from f:6 words 90% success over 3 sessions    Status Deferred      SLP SHORT TERM GOAL #7   Title pt will demo knowledge of pertinent /functional information placement on  speech generating device 75% with rare min A    Time 4    Period Weeks    Status Achieved      SLP SHORT TERM GOAL #8   Title pt will demonstrate how to generate a new icon with written cues for procedure (max A for spelling) x3 sessions    Time 3    Period Weeks    Status On-going      SLP SHORT TERM GOAL  #9   TITLE pt will communicate simple information (places, people, food, etc) using Lingraphica with occasional min a for navigation x3 sessions    Time 3    Period Weeks    Status Achieved            SLP Long Term Goals - 01/26/20 1254      SLP LONG TERM GOAL #1   Title pt's diet will be appropriately upgraded as needed by SLP    Period --   or 25 total visits, for all LTGs   Status Deferred   pt eating preferred diet without overt s/sx aspiration PNA     SLP LONG TERM GOAL #2   Title pt will produce 3 simple words/words pertinent to pt (family or pet names, etc) simultaneously with SLP and multimodal cues over three sessions    Status Not Met      SLP LONG TERM GOAL #3   Title pt will demo understanding of simple yes/no questions pertinent to desires/wants with multimodal cues 90% of the time over 3 sessions    Status Not Met      SLP LONG TERM GOAL #4   Title a device trial with a speech generating device will be initiated within the first 20 ST visits    Status Achieved      SLP LONG TERM GOAL #5   Title pt will demo recognition of written word for a common object 100% success over 3 sessions    Baseline 11-13-19, 11-20-19    Status Achieved      SLP LONG TERM GOAL #6   Title pt will work on speech-related tasks at home between 75% of sessions as reported by family and/or noted on [place] website    Baseline 10-21-19, 10-26-19, 10-28-19, 11-11-19, 11-13-19, 11-20-19, 11-24-19, 11-27-19    Status Partially Met      SLP LONG TERM GOAL #7  Title pt will demo knowledge of specific pertinent /functional information placement on speech generating device 90%      Time 5    Period Weeks   or 41 total sessions, for all LTGs   Status On-going      SLP LONG TERM GOAL #8   Title pt will demonstrate how to generate a new icon with occasional min A for procedure (max A for spelling) x3 sessions    Time 7    Period Weeks    Status On-going      SLP LONG TERM GOAL  #9   TITLE pt will communicate feelings or other personal mod complex information using Lingraphica with rare min a for navigation x3 sessions    Time 7    Period Weeks    Status On-going            Plan - 01/26/20 1253    Clinical Impression Statement Pt expressive language and speech cont non functional. She continues to demonstrate deficits of attention, awareness, impulsivity. Severe aphasia and verbal apraxia. Ongoing training in AAC device and multimodal communication. Low frustration tolerance affects progress in ST. Continue skilled ST to maximize communication of wants/needs and to reduce caregiver burden 1 more visit.    Speech Therapy Frequency 2x / week    Duration --   16 weeks or 34 visits   Treatment/Interventions Diet toleration management by SLP;Trials of upgraded texture/liquids;Language facilitation;Internal/external aids;Cognitive reorganization;Cueing hierarchy;SLP instruction and feedback;Patient/family education;Compensatory strategies;Multimodal communcation approach;Functional tasks;Environmental controls           Patient will benefit from skilled therapeutic intervention in order to improve the following deficits and impairments:   Aphasia  Verbal apraxia  Cognitive communication deficit    Problem List Patient Active Problem List   Diagnosis Date Noted  . 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 Nontraumatic subcortical hemorrhage of left cerebral hemisphere ([place]) 08/27/2019  . Acute blood loss anemia   . Hypoalbuminemia due to protein-calorie malnutrition ([place])   . Thrombocytopenia ([place])   . Dysphagia  07/29/2019  . Infarction of left basal ganglia ([place]) 07/18/2019  . Hypernatremia   . Leukocytosis   . Essential hypertension   . Global aphasia   . Cytotoxic brain edema ([place]) 07/10/2019  . ICH (intracerebral hemorrhage) ([place]) 07/09/2019  . Anxiety state 02/21/2015  . Cephalalgia 12/21/2014    Verland Sprinkle, Annye Rusk MS, CCC-SLP 01/26/2020, 12:55 PM  [facility] [address] [place], [place], [address] Phone: [phone]   Fax:  [phone]   Name: Laquashia Mergenthaler Da Glennie Madden MRN: [id number] Date of Birth: 10-03-1965

## 2020-01-26 NOTE — Therapy (Signed)
[facility] [address] [place], [place], [address] Phone: [phone]   Fax:  [phone]  Physical Therapy Treatment  Patient Details  Name: Melanie Madden MRN: [id number] Date of Birth: [birth year] Referring Provider (PT): Lauraine Rinne, [place]   Encounter Date: 01/25/2020   PT End of Session - 01/26/20 0826    Visit Number 38    Number of Visits 54    Date for PT Re-Evaluation 03/29/20    Authorization Type [employer]- 60 VL combined PT/OT/ST - counts as 1 visit if seen on same day.  Not sure if 60 is hard max.  $50 copay each day - make sure PT/OT/ST on same day if possible    Authorization - Visit Number --   PT/OT/ST days combined   Authorization - Number of Visits 60    PT Start Time 1147    PT Stop Time 1231    PT Time Calculation (min) 44 min    Equipment Utilized During Treatment --   pt's R AFO; dance shoe cover to aid in foot clearance   Activity Tolerance Patient tolerated treatment well    Behavior During Therapy Impulsive;Agitated           Past Medical History:  Diagnosis Date  . Allergy   . Anxiety   . Arthritis   . High triglycerides   . Kidney stones   . Migraines   . Recurrent sinus infections     Past Surgical History:  Procedure Laterality Date  . ANTERIOR CRUCIATE LIGAMENT REPAIR Left   . BUNIONECTOMY Right   . LASIK    . LITHOTRIPSY    . MENISCUS REPAIR Left   . NASAL SEPTUM SURGERY    . SHOULDER SURGERY    . VARICOSE VEIN SURGERY Left     There were no vitals filed for this visit.   Subjective Assessment - 01/25/20 1153    Subjective Does not appear to be having any more foot pain.  Upset that husband is talking to OT.    Patient is accompained by: --   Caregivers   Pertinent History anxiety, migraines, HTN    Patient Stated Goals Per husband report they would like her to get stronger on right, improve mobility, and work on speech.    Currently in Pain?  No/denies                             [facility] Adult PT Treatment/Exercise - 01/25/20 1201      Transfers   Transfers Sit to Stand;Stand to Sit    Sit to Stand 4: Min guard    Sit to Stand Details (indicate cue type and reason) Still requires assistance and cues to bring R foot back into BOS and to stand with COG in midline    Stand to Sit 4: Min guard    Stand to Sit Details continues to sit with uncontrolled descent    Stand Pivot Transfers 4: Min guard    Stand Pivot Transfer Details (indicate cue type and reason) continues to require cues to pivot fully prior to sitting on mat      Ambulation/Gait   Ambulation/Gait Yes    Ambulation/Gait Assistance 4: Min guard;5: Supervision    Ambulation/Gait Assistance Details into and out of treatment area; pt able to perform with supervision but therapist continues to provide tactile cues at trunk for more midline alignment and for anterior rotation  of R pelvis to advance RLE with hip and knee flexion instead of circumduction    Ambulation Distance (Feet) 115 Feet    Assistive device Hemi-walker    Gait Pattern Step-to pattern;Step-through pattern;Decreased hip/knee flexion - right;Decreased stance time - right;Decreased weight shift to right;Poor foot clearance - right;Lateral trunk lean to left;Right circumduction    Ambulation Surface Level;Indoor    Stairs Yes    Stairs Assistance 3: Mod assist    Stairs Assistance Details (indicate cue type and reason) when ascending pt required minimal assistance to fully clear RLE to next step and to stabilize knee when ascending with with RLE.  Pt able to ascend over RLE to advance LLE to next step for alternating sequence.  When descending performed step to leading with RLE due to AFO; min-mod A to place RLE and assist with advancing forwards onto RLE.      Stair Management Technique One rail Left;Alternating pattern;Step to pattern;Forwards    Number of Stairs 8    Height of Stairs 6        Neuro Re-ed    Neuro Re-ed Details  NMR seated on physioball with mirror in front for visual feedback.  Performed postural control training, weight shifting and stabilization through R side.  Performed anterior pelvic tilt to shift COG forwards to LE for more upright posture and improved sitting balance; cued to maintain posture while performing LLE hip flexion with therapist assisting with RLE stability.  Also performed alternating LE hip flexion with therapist assisting with initiating RLE hip flexion - pt unable to activate from seated position.  Transitioned to lateral pelvic tilts for lateral weight shifting and diagonal weight shifting with pelvic rotation forwards/backwards.  Also focused on postural control and weight shifting with reaching with LUE first across midline focusing on stability on R side and then reaching up and to the left from seated position to facilitate L trunk elongation and R trunk shortening.  Progressed to reaching up and to the L with sit <> stand from physioball to facilitate extension through R side; cued pt to lean forwards and push into aide's hand when leaning and coming to stand instead of pulling.  Carryover of NMR technique with ambulation: cued pt to raise LUE and push into aide's hand while ambulating forwards x 10' and cues for increased stance time on RLE.                  PT Education - 01/26/20 0825    Education Details stair negotiation    Person(s) Educated Patient;Caregiver(s)    Methods Explanation    Comprehension Verbal cues required;Tactile cues required;Need further instruction            PT Short Term Goals - 12/30/19 2018      PT SHORT TERM GOAL #1   Title Pt will decrease 5 x sit to stand from 22 sec to <20 sec from mat for improved balance and functional strength.    Baseline 22 sec on 12/30/19    Time 4    Period Weeks    Status New    Target Date 01/29/20      PT SHORT TERM GOAL #2   Title Pt will ambulate with LRAD min  assist >300' on level surfaces for improved household mobility with caregiver assist.    Time 4    Period Weeks    Status New    Target Date 01/29/20  PT Long Term Goals - 12/30/19 2021      PT LONG TERM GOAL #1   Title Pt will be able to perform progressive HEP for strengthening, balance and mobility with assist of husband to continued gains at home.    Baseline PT continues to add to HEP    Time 8    Period Weeks    Status On-going    Target Date 02/28/20      PT LONG TERM GOAL #2   Title Pt will increase Berg Balance from 20/56 to >26/ 56 for improved balance.    Baseline 20/56 on 12/30/19    Time 8    Period Weeks    Status New    Target Date 02/28/20      PT LONG TERM GOAL #3   Title Pt will ambulate >400' CGA with LRAD  for improved gait ability in home.    Baseline 230' with RW min/mod assist, 73' with hemiwalker CGA.    Time 8    Period Weeks    Status Revised    Target Date 02/28/20      PT LONG TERM GOAL #4   Title Pt will increase gait speed from 0.65m/s to >0.32m/s for improved gait safety.    Baseline 0.61m/s on 12/30/19    Time 8    Period Weeks    Status New    Target Date 02/28/20                 Plan - 01/26/20 0827    Clinical Impression Statement No pain reported in R foot today.  Pt able to continue gait and NMR training with use of stair negotiation and seated activities on physioball to facilitate increased weight shifting, WB and activation through R side.  Pt tolerated well and with repetition, resistance and hand over hand guidance pt able to slow down movements and activate R side with decreased compensation.  Will continue to progress towards LTG.    Personal Factors and Comorbidities Comorbidity 3+    Comorbidities anxiety, migraines, HTN    Examination-Activity Limitations Bathing;Bed Mobility;Stand;Locomotion Level;Squat;Stairs;Dressing;Transfers    Examination-Participation Restrictions Community  Activity;Driving;Shop;Laundry;Meal Prep    Stability/Clinical Decision Making Evolving/Moderate complexity    Rehab Potential Good    PT Frequency 2x / week    PT Duration 8 weeks    PT Treatment/Interventions ADLs/Self Care Home Management;Electrical Stimulation;DME Instruction;Gait training;Stair training;Functional mobility training;Therapeutic activities;Therapeutic exercise;Orthotic Fit/Training;Patient/family education;Neuromuscular re-education;Balance training;Manual techniques;Passive range of motion;Vestibular;Wheelchair mobility training    PT Next Visit Plan Check STG.  Did well with stair negotiation.  Seated physioball activities.  Walking with LUE reaching up and to the L pushing into tech/aide's/student's hand for more upright posture and R activation.  Continue with activities in standing to facilitate increased right weight shift and R hip flexion/initiation of R swing phase, continue gait with hemiwalker with givmohr sling use.  Pt responded well to short bout on Sci[employer].  Gait with her UE on your shoulders to get more upright posture, slow her down and get more weight shift.    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  Nontraumatic subcortical hemorrhage of left cerebral hemisphere ([place]) 08/27/2019  . Acute blood loss anemia   . Hypoalbuminemia due to protein-calorie malnutrition ([place])   . Thrombocytopenia ([place])   . Dysphagia 07/29/2019  . Infarction of left basal ganglia ([place]) 07/18/2019  . Hypernatremia   . Leukocytosis   . Essential hypertension   . Global aphasia   . Cytotoxic brain edema ([place]) 07/10/2019  . ICH (intracerebral hemorrhage) ([place]) 07/09/2019  . Anxiety state 02/21/2015  . Cephalalgia 12/21/2014    Rico Junker, PT, DPT 01/26/20    8:33 AM    [facility] [address] [place], [place], [address] Phone: [phone]   Fax:  [phone]  Name: Melanie Madden MRN: [id number] Date of Birth: 05/17/1966

## 2020-01-27 ENCOUNTER — Ambulatory Visit: Payer: Managed Care, Other (non HMO) | Admitting: Occupational Therapy

## 2020-01-27 ENCOUNTER — Ambulatory Visit: Payer: Managed Care, Other (non HMO) | Admitting: Speech Pathology

## 2020-01-27 ENCOUNTER — Encounter: Payer: Self-pay | Admitting: Speech Pathology

## 2020-01-27 ENCOUNTER — Other Ambulatory Visit: Payer: Self-pay

## 2020-01-27 ENCOUNTER — Ambulatory Visit: Payer: Managed Care, Other (non HMO)

## 2020-01-27 DIAGNOSIS — I69153 Hemiplegia and hemiparesis following nontraumatic intracerebral hemorrhage affecting right non-dominant side: Secondary | ICD-10-CM

## 2020-01-27 DIAGNOSIS — M6281 Muscle weakness (generalized): Secondary | ICD-10-CM

## 2020-01-27 DIAGNOSIS — R41841 Cognitive communication deficit: Secondary | ICD-10-CM

## 2020-01-27 DIAGNOSIS — R482 Apraxia: Secondary | ICD-10-CM

## 2020-01-27 DIAGNOSIS — R2689 Other abnormalities of gait and mobility: Secondary | ICD-10-CM

## 2020-01-27 DIAGNOSIS — R4701 Aphasia: Secondary | ICD-10-CM

## 2020-01-27 NOTE — Therapy (Addendum)
[facility] [address] [place], [place], [address] Phone: [phone]   Fax:  [phone]  Occupational Therapy Treatment  Patient Details  Name: Melanie Madden MRN: [id number] Date of Birth: 09-20-1965 Referring Provider (OT): Dr. Alysia Penna   Encounter Date: 01/27/2020   OT End of Session - 01/27/20 1144    Visit Number 35    Number of Visits 40    Date for OT Re-Evaluation 01/27/20    Authorization Type [employer] 2021:  No prior auth OT, 60 DAY visit limit (regardless of how many therapies each day).    Authorization - Visit Number 41    Authorization - Number of Visits 60    OT Start Time 1100    OT Stop Time 1145    OT Time Calculation (min) 45 min    Activity Tolerance Patient tolerated treatment well    Behavior During Therapy Impulsive;Agitated           Past Medical History:  Diagnosis Date  . Allergy   . Anxiety   . Arthritis   . High triglycerides   . Kidney stones   . Migraines   . Recurrent sinus infections     Past Surgical History:  Procedure Laterality Date  . ANTERIOR CRUCIATE LIGAMENT REPAIR Left   . BUNIONECTOMY Right   . LASIK    . LITHOTRIPSY    . MENISCUS REPAIR Left   . NASAL SEPTUM SURGERY    . SHOULDER SURGERY    . VARICOSE VEIN SURGERY Left     There were no vitals filed for this visit.   Subjective Assessment - 01/27/20 1107    Pertinent History Subcortical hemorrhage of R cerbral hemisphere.  PMH:  HTN, anxiety, migraines, hx of shoulder injection approx 2 weeks ago for R shoulder pain, hx of R shoulder surgery (arthroscopic)    Limitations aphasia, fall risk, impulsive (husband reports hx of OCD)    Special Tests PER HUSBAND for sit>stand:  pt responds to "big push" with knee blocked on both side    Patient Stated Goals husband reports: to be able to care for herself more (pt indicates that she onces to return to previous activities per  questioning/gestures)    Currently in Pain? No/denies           Holding ball bilateral hands with assist needed to maintain contact Rt hand for placing on floor and on chair across from patient with mod assist RUE. Practiced just holding ball and maintaining contact w/ Rt hand w/ visual feedback and min assist. Pt able to maintain contact I'ly for 2 sec before hand slipped off AA/ROM BUE's w/ pool noodle going into gravity sliding along transfer board w/ cues and min assist for RUE NMES to wrist and finger extensors x 10 min (previous parameters)                         OT Short Term Goals - 01/13/20 1301      OT SHORT TERM GOAL #1   Title Pt/husband will be independent with initial HEP for RUE ROM--check STGs 10/14/19    Time 6    Period Weeks    Status Achieved      OT SHORT TERM GOAL #2   Title Pt will perform UB dressing with min A.    Time 6    Period Weeks    Status Achieved   MOD ASSIST.  11/11/19:  pt able to don/doff zip-up fleece mod I.  11/17/19:  met per husband report     OT SHORT TERM GOAL #3   Title Pt will perform UB bathing with mod A.    Time 6    Period Weeks    Status Achieved   min assist for Lt arm     OT SHORT TERM GOAL #4   Title Pt will be able to brush hair with set-up.    Time 4    Period Weeks    Status Achieved    Target Date 12/27/19      OT SHORT TERM GOAL #5   Title Pt will demo at least 110* shoulder flex PROM in supine without pain for incr ease for ADLs.    Time 6    Period Weeks    Status Achieved      OT SHORT TERM GOAL #6   Title Pt will be able to stabilze light object in [place] for low level bilateral reach.--check updated STGs 12/27/19    Time 4    Period Weeks    Status Not Met    Target Date 01/08/20   extend due to decr frequency due to scheduling     OT SHORT TERM GOAL #7   Title Pt will be able to use RUE as a stablizer consistently for selected tasks mod I.    Time 4    Period Weeks    Status Not Met     Target Date 01/08/20             OT Long Term Goals - 01/25/20 1107      OT LONG TERM GOAL #1   Title Pt/husband will be independent with HEP for functional activities for incr participation in IADLs/leisure tasks (and incorporating RUE if able).--check updated LTGs 01/27/20    Time 8    Period Weeks    Status Deferred   unable to progress     OT LONG TERM GOAL #2   Title Pt will perform UB dressing with supervision.    Time 12    Period Weeks    Status Achieved   11/24/19     OT LONG TERM GOAL #3   Title Pt will perform LB dressing with mod A.    Time 12    Period Weeks    Status Achieved   11/17/19:  min A per husband report     OT LONG TERM GOAL #4   Title Pt will perform UB bathing with min A.    Time 12    Period Weeks    Status Achieved   11/17/19:  met per husband report     OT LONG TERM GOAL #5   Title Pt will perform simple cold snack prep  with min A/set up.    Time 8    Period Weeks    Status Achieved   from w/c level only - can stand to get items from cabinet with direct supervision     OT LONG TERM GOAL #6   Title Pt will perform toileting with mod A.    Time 12    Period Weeks    Status Achieved   11/24/19:  CGA/close supervision for ambulation/standing per aide report     OT LONG TERM GOAL #7   Title Pt will demo at least 20* active shoulder flex in prep for functional low-level reach.    Time 8    Period Weeks    Status  Not Met      OT LONG TERM GOAL #8   Title Pt will be able to weight bear through RUE in standing at counter to assist with ADL/IADL tasks.    Time 8    Period Weeks    Status Partially Met   only with supervision, min facilitation                Plan - 01/27/20 1144    Clinical Impression Statement Pt/family and caregiver have HEP to carryover for home upon d/c    Occupational performance deficits (Please refer to evaluation for details): ADL's;IADL's;Leisure;Work;Social Participation    Body Structure / Function /  Physical Skills ADL;ROM;IADL;Sensation;Mobility;Strength;Tone;UE functional use;Decreased knowledge of precautions;Decreased knowledge of use of DME;Coordination;Balance;FMC;GMC    Cognitive Skills Attention;Temperament/Personality;Understand;Thought;Safety Awareness    OT Frequency 2x / week    OT Duration 8 weeks    OT Treatment/Interventions Self-care/ADL training;Moist Heat;DME and/or AE instruction;Splinting;Therapeutic activities;Aquatic Therapy;Cognitive remediation/compensation;Therapeutic exercise;Cryotherapy;Neuromuscular education;Functional Mobility Training;Passive range of motion;Visual/perceptual remediation/compensation;Patient/family education;Manual Therapy;Electrical Stimulation;Fluidtherapy    Plan D/C O.Donnajean Lopes and Agree with Plan of Care Patient;Family member/caregiver           Patient will benefit from skilled therapeutic intervention in order to improve the following deficits and impairments:   Body Structure / Function / Physical Skills: ADL, ROM, IADL, Sensation, Mobility, Strength, Tone, UE functional use, Decreased knowledge of precautions, Decreased knowledge of use of DME, Coordination, Balance, FMC, GMC Cognitive Skills: Attention, Temperament/Personality, Understand, Thought, Safety Awareness     Visit Diagnosis: 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 Nontraumatic subcortical hemorrhage of left cerebral hemisphere ([place]) 08/27/2019  . Acute blood loss anemia   . Hypoalbuminemia due to protein-calorie malnutrition ([place])   . Thrombocytopenia ([place])   . Dysphagia 07/29/2019  . Infarction of left basal ganglia ([place]) 07/18/2019  . Hypernatremia   . Leukocytosis   . Essential hypertension   . Global aphasia   . Cytotoxic  brain edema ([place]) 07/10/2019  . ICH (intracerebral hemorrhage) ([place]) 07/09/2019  . Anxiety state 02/21/2015  . Cephalalgia 12/21/2014   OCCUPATIONAL THERAPY DISCHARGE SUMMARY  Visits from Start of Care: 35  Current functional level related to goals / functional outcomes: SEE ABOVE   Remaining deficits: Dense Rt dominant side hemiplegia Awareness Attention Impulsivity   Education / Equipment: HEP's, A/E recommendations, bed positioning, NMES education  Plan: Patient agrees to discharge.  Patient goals were partially met. Patient is being discharged due to lack of progress.  ?????       Carey Bullocks, OTR/L 01/27/2020, 12:16 PM  [facility] [address] [place], [place], [address] Phone: [phone]   Fax:  [phone]  Name: Melanie Madden MRN: [id number] Date of Birth: July 31, 1965

## 2020-01-27 NOTE — Patient Instructions (Signed)
   Keep trying the tablet, even if it is just for a conversation topic, for example the back yard birds. I added [name] icon under the [place] tab just to talk about it. But don't push it if she is frustrated  I wonder if she bites her right cheek and lips when she eats and that is what may be bothering her. I tried to ask but am not sure she really understood.   Melanie Madden can come back to ST in 6 months or when she wants to start back working on communication.

## 2020-01-27 NOTE — Therapy (Signed)
[facility] [address] [place], [place], [address] Phone: [phone]   Fax:  [phone]  Speech Language Pathology Treatment & Discharge Summary  Patient Details  Name: Melanie Madden MRN: [id number] Date of Birth: 31-Oct-1965 Referring Provider (SLP): Alysia Penna, MD   Encounter Date: 01/27/2020   End of Session - 01/27/20 1249    Visit Number 34    Number of Visits 41    Date for SLP Re-Evaluation 02/26/20    Authorization Time Period 60 DAYS of therapy regardless of number disciplines per day    SLP Start Time 1017    SLP Stop Time  1228    SLP Time Calculation (min) 41 min    Activity Tolerance Patient tolerated treatment well           Past Medical History:  Diagnosis Date  . Allergy   . Anxiety   . Arthritis   . High triglycerides   . Kidney stones   . Migraines   . Recurrent sinus infections     Past Surgical History:  Procedure Laterality Date  . ANTERIOR CRUCIATE LIGAMENT REPAIR Left   . BUNIONECTOMY Right   . LASIK    . LITHOTRIPSY    . MENISCUS REPAIR Left   . NASAL SEPTUM SURGERY    . SHOULDER SURGERY    . VARICOSE VEIN SURGERY Left     There were no vitals filed for this visit.   Subjective Assessment - 01/27/20 1235    Subjective Points to door cracked as she wants it closed al the way    Currently in Pain? No/denies              SPEECH THERAPY DISCHARGE SUMMARY  Visits from Start of Care: 55  Current functional level related to goals / functional outcomes: See goals below   Remaining deficits: Severe expressive and moderate receptive aphasia; cognition   Education / Equipment: Compensations for aphasia; use of speech generating device;  Plan: Patient agrees to discharge.  Patient goals were partially met. Patient is being discharged due to lack of progress.  ?????         ADULT SLP TREATMENT - 01/27/20 1236      General Information     Behavior/Cognition Alert;Cooperative;Impulsive;Requires cueing      Treatment Provided   Treatment provided Cognitive-Linquistic      Cognitive-Linquistic Treatment   Treatment focused on Aphasia;Apraxia;Patient/family/caregiver education    Skilled Treatment Today, targeted conversation re: a favorite vacation. With choice of 2 written cues, Alexiah chose beach. Given choice of 3 states Deysi indicated none of them. She used gesture to communicate her beach was far away. With written cues, she affirmed [place]. We used her Touch Talk to ID 6 places in [place] she has visited as she used to live there. These were added to her device. Discussed with Veleka that it is her last day of ST and encouraged her to return in 6 months if she wanted to continue ST when depression and awareness improve      Assessment / Recommendations / Plan   Plan Discharge SLP treatment due to (comment)      Progression Toward Goals   Progression toward goals Not progressing toward goals (comment)   depression awareness and carryover              SLP Short Term Goals - 01/27/20 1247      SLP SHORT TERM GOAL #1  Title pt will simultaneously with SLP produce 3 different phonemes 50% of the time over three sessions    Status Partially Met      SLP SHORT TERM GOAL #2   Title pt will ID objects salient to pt in f:4 80% of the time over 3 sessions    Status Deferred      SLP [place] #3   Title pt will write her full name on first attempt over three sessions    Time 1    Status Deferred      SLP SHORT TERM GOAL #4   Title pt will attempt expressive communication via multimodal means during 5 sessions    Status Not Met      SLP SHORT TERM GOAL #5   Title pt will demonstrate undertanding of simple 1-step functional commands with 70% accuracy with multimodal cues over 3 sessions    Status Not Met      SLP SHORT TERM GOAL #6   Title pt will look at object or line drawing and match the  correct word from f:6 words 90% success over 3 sessions    Status Deferred      SLP SHORT TERM GOAL #7   Title pt will demo knowledge of pertinent /functional information placement on speech generating device 75% with rare min A    Time 4    Period Weeks    Status Achieved      SLP SHORT TERM GOAL #8   Title pt will demonstrate how to generate a new icon with written cues for procedure (max A for spelling) x3 sessions    Time 3    Period Weeks    Status Not Met      SLP SHORT TERM GOAL  #9   TITLE pt will communicate simple information (places, people, food, etc) using [employer]raph[employer] with occasional min a for navigation x3 sessions    Time 3    Period Weeks    Status Achieved            SLP Long Term Goals - 01/27/20 1248      SLP LONG TERM GOAL #1   Title pt's diet will be appropriately upgraded as needed by SLP    Period --   or 25 total visits, for all LTGs   Status Deferred   pt eating preferred diet without overt s/sx aspiration PNA     SLP LONG TERM GOAL #2   Title pt will produce 3 simple words/words pertinent to pt (family or pet names, etc) simultaneously with SLP and multimodal cues over three sessions    Status Not Met      SLP LONG TERM GOAL #3   Title pt will demo understanding of simple yes/no questions pertinent to desires/wants with multimodal cues 90% of the time over 3 sessions    Status Not Met      SLP LONG TERM GOAL #4   Title a device trial with a speech generating device will be initiated within the first 20 ST visits    Status Achieved      SLP LONG TERM GOAL #5   Title pt will demo recognition of written word for a common object 100% success over 3 sessions    Baseline 11-13-19, 11-20-19    Status Achieved      SLP LONG TERM GOAL #6   Title pt will work on speech-related tasks at home between 75% of sessions as reported by family and/or noted on  [employer] website    Baseline 10-21-19, 10-26-19, 10-28-19, 11-11-19, 11-13-19, 11-20-19,  11-24-19, 11-27-19    Status Partially Met      SLP LONG TERM GOAL #7   Title pt will demo knowledge of specific pertinent /functional information placement on speech generating device 90%    Time 5    Period Weeks   or 41 total sessions, for all LTGs   Status Achieved      SLP LONG TERM GOAL #8   Title pt will demonstrate how to generate a new icon with occasional min A for procedure (max A for spelling) x3 sessions    Time 7    Period Weeks    Status Not Met      SLP LONG TERM GOAL  #9   TITLE pt will communicate feelings or other personal mod complex information using Lingraphica with rare min a for navigation x3 sessions    Time 7    Period Weeks    Status Not Met            Plan - 01/27/20 1242    Clinical Impression Statement 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 and moderate receptive aphasia persist. Shiana has demonstrated function use of Touch Talk in structured task and to answer questions. More recently, she has been not wanting to use the device, however, without it, non verbal (gestures) and verbal expression are non functional. Today, Suann wanted a drink she inconsistently cupped and extended her hand and brought it to her head and eyes several times. Depression, frustration and poor awarness have impeded progress this course of treament. I recommend Ivyanna be d/c'd from [place] and return in 6 months or sooner if she has improved awareness.Marlen is in agreement.    Speech Therapy Frequency 2x / week    Duration --   16 weeks or 34 visits   Treatment/Interventions Diet toleration management by SLP;Trials of upgraded texture/liquids;Language facilitation;Internal/external aids;Cognitive reorganization;Cueing hierarchy;SLP instruction and feedback;Patient/family education;Compensatory strategies;Multimodal communcation approach;Functional tasks;Environmental controls    Potential to Achieve Goals Fair    Potential Considerations Severity of  impairments;Cooperation/participation level           Patient will benefit from skilled therapeutic intervention in order to improve the following deficits and impairments:   Aphasia  Verbal apraxia  Cognitive communication deficit    Problem List Patient Active Problem List   Diagnosis Date Noted  . 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 Nontraumatic subcortical hemorrhage of left cerebral hemisphere ([place]) 08/27/2019  . Acute blood loss anemia   . Hypoalbuminemia due to protein-calorie malnutrition ([place])   . Thrombocytopenia ([place])   . Dysphagia 07/29/2019  . Infarction of left basal ganglia ([place]) 07/18/2019  . Hypernatremia   . Leukocytosis   . Essential hypertension   . Global aphasia   . Cytotoxic brain edema ([place]) 07/10/2019  . ICH (intracerebral hemorrhage) ([place]) 07/09/2019  . Anxiety state 02/21/2015  . Cephalalgia 12/21/2014    Delmont Prosch, Annye Rusk MS, CCC-SLP 01/27/2020, 12:50 PM  [facility] [address] [place], [place], [address] Phone: [phone]   Fax:  [phone]   Name: Syanne Looney Da Glennie Madden MRN: [id number] Date of Birth: 05-24-1966

## 2020-01-27 NOTE — Therapy (Signed)
[facility] [address] [place], [place], [address] Phone: [phone]   Fax:  [phone]  Physical Therapy Treatment  Patient Details  Name: Melanie Madden MRN: [id number] Date of Birth: 10/20/1965 Referring Provider (PT): Daniel Angiulli, PA   Encounter Date: 01/27/2020   PT End of Session - 01/27/20 1231    Visit Number 39    Number of Visits 54    Date for PT Re-Evaluation 03/29/20    Authorization Type [employer]- 60 VL combined PT/OT/ST - counts as 1 visit if seen on same day.  Not sure if 60 is hard max.  $50 copay each day - make sure PT/OT/ST on same day if possible    Authorization - Visit Number --   PT/OT/ST days combined   Authorization - Number of Visits 60    PT Start Time 1230    PT Stop Time 1315    PT Time Calculation (min) 45 min    Equipment Utilized During Treatment --   pt's R AFO; dance shoe cover to aid in foot clearance   Activity Tolerance Patient tolerated treatment well    Behavior During Therapy Impulsive;Agitated           Past Medical History:  Diagnosis Date  . Allergy   . Anxiety   . Arthritis   . High triglycerides   . Kidney stones   . Migraines   . Recurrent sinus infections     Past Surgical History:  Procedure Laterality Date  . ANTERIOR CRUCIATE LIGAMENT REPAIR Left   . BUNIONECTOMY Right   . LASIK    . LITHOTRIPSY    . MENISCUS REPAIR Left   . NASAL SEPTUM SURGERY    . SHOULDER SURGERY    . VARICOSE VEIN SURGERY Left     There were no vitals filed for this visit.   Subjective Assessment - 01/27/20 1232    Subjective Pt just finished her other sessions. She denies any issues. Caregiver asking about smaller brace.    Patient is accompained by: --   Caregivers   Pertinent History anxiety, migraines, HTN    Patient Stated Goals Per husband report they would like her to get stronger on right, improve mobility, and work on speech.    Currently in  Pain? No/denies                             [facility] Adult PT Treatment/Exercise - 01/27/20 1233      Transfers   Transfers Sit to Stand;Stand to Sit    Sit to Stand 4: Min guard    Sit to Stand Details Verbal cues for technique    Sit to Stand Details (indicate cue type and reason) Pt was cued to bring right foot under her prior to going to stand    Stand to Sit 4: Min guard      Ambulation/Gait   Ambulation/Gait Yes    Ambulation/Gait Assistance 5: Supervision;4: Min guard    Ambulation/Gait Assistance Details Pt was given intermittent cues to try to get more upright posture with gait at trunk and pelvis.    Ambulation Distance (Feet) 115 Feet   115' x 1, 200' x 1   Assistive device Hemi-walker    Gait Pattern Step-to pattern;Step-through pattern;Decreased stance time - right;Decreased step length - left;Decreased hip/knee flexion - right;Decreased weight shift to right;Right circumduction;Lateral trunk lean to left    Ambulation   Surface Level;Indoor      Therapeutic Activites    Therapeutic Activities Other Therapeutic Activities    Other Therapeutic Activities PT assisted in restroom: min assist to adjust pants. Cued to hold to grab bar to help control descent and with raising. PT also had to place right foot in proper position.      Neuro Re-ed    Neuro Re-ed Details  Seated on blue physioball: pelvic tilts ant/post x 10, reaching up with LUE and across body x 10 each CGA and assist to position RLE, sit to stand from ball with left hand overhead pushing in to rehab techs hand and verbal cues to lean forward. PT behind patient and assisting min assist to rise and get balance. Adjusted pt's hand so her fingers were flat on tech's hand and not grabbing around to pull. Gait with left arm up again pushing in to rehab tech's hand x 25' with PT behind assist to facilitate weight shift. Pt fatigued quickly with this activity. Standing in front of mat reaching to place 6 yellow  clips on pole overhead in front then slightly to the right so she had to weight shift more on right leg. PT helped to facilitate right weight shift. Sit to stand from mat with holding left arm up overhead and reaching x 5 min assist from PT to come forward to rise.  Stepping forward and back with LLE x 10 with PT stabilizing right leg with hemiwalker support then repeated with only light support on high table x 10                  PT Education - 01/27/20 1754    Education Details PT discussed with caregiver that she did not advise smaller AFO at this time as if anterior piece was removed it would not provide patient any support to prevent knee buckling. Advised there is nothing she can donn without a shoe.    Person(s) Educated Patient;Caregiver(s)    Methods Explanation    Comprehension Verbalized understanding            PT Short Term Goals - 01/27/20 1756      PT SHORT TERM GOAL #1   Title Pt will decrease 5 x sit to stand from 22 sec to <20 sec from mat for improved balance and functional strength.    Baseline 22 sec on 12/30/19    Time 4    Period Weeks    Status New    Target Date 01/29/20      PT SHORT TERM GOAL #2   Title Pt will ambulate with LRAD min assist >300' on level surfaces for improved household mobility with caregiver assist.    Baseline 200' close SBA/CGA with hemiwalker    Time 4    Period Weeks    Status Partially Met    Target Date 01/29/20             PT Long Term Goals - 12/30/19 2021      PT LONG TERM GOAL #1   Title Pt will be able to perform progressive HEP for strengthening, balance and mobility with assist of husband to continued gains at home.    Baseline PT continues to add to HEP    Time 8    Period Weeks    Status On-going    Target Date 02/28/20      PT LONG TERM GOAL #2   Title Pt will increase Berg Balance from 20/56 to >26/ 56   for improved balance.    Baseline 20/56 on 12/30/19    Time 8    Period Weeks    Status New     Target Date 02/28/20      PT LONG TERM GOAL #3   Title Pt will ambulate >400' CGA with LRAD  for improved gait ability in home.    Baseline 230' with RW min/mod assist, 15' with hemiwalker CGA.    Time 8    Period Weeks    Status Revised    Target Date 02/28/20      PT LONG TERM GOAL #4   Title Pt will increase gait speed from 0.77ms to >0.329m for improved gait safety.    Baseline 0.1141mon 12/30/19    Time 8    Period Weeks    Status New    Target Date 02/28/20                 Plan - 01/27/20 1757    Clinical Impression Statement PT continued to focus on trying to lengthen through left trunk to decrease compensation with left lateral trunk lean during gait. Pt continues to show some improvement in this. Pt requiring decreased assistance with hemiwalker with close SBA/CGA and PT touching more to try to facilitate weight shift to the right and more upright posture. Pt continues to progress towards goals.    Personal Factors and Comorbidities Comorbidity 3+    Comorbidities anxiety, migraines, HTN    Examination-Activity Limitations Bathing;Bed Mobility;Stand;Locomotion Level;Squat;Stairs;Dressing;Transfers    Examination-Participation Restrictions Community Activity;Driving;Shop;Laundry;Meal Prep    Stability/Clinical Decision Making Evolving/Moderate complexity    Rehab Potential Good    PT Frequency 2x / week    PT Duration 8 weeks    PT Treatment/Interventions ADLs/Self Care Home Management;Electrical Stimulation;DME Instruction;Gait training;Stair training;Functional mobility training;Therapeutic activities;Therapeutic exercise;Orthotic Fit/Training;Patient/family education;Neuromuscular re-education;Balance training;Manual techniques;Passive range of motion;Vestibular;Wheelchair mobility training    PT Next Visit Plan Check 5 x sit to stand STG as I forgot last session. Also see if she can complete the 300' STG. Did well with stair negotiation.  Seated physioball  activities.  Walking with LUE reaching up and to the L pushing into tech/aide's/student's hand for more upright posture and R activation.  Continue with activities in standing to facilitate increased right weight shift and R hip flexion/initiation of R swing phase, continue gait with hemiwalker with givmohr sling use.  Pt responded well to short bout on SciFit.  Gait with her UE on your shoulders to get more upright posture, slow her down and get more weight shift.    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 Nontraumatic subcortical hemorrhage of left cerebral hemisphere ([place]CC[place]1/28/2021  . Acute blood loss anemia   . Hypoalbuminemia due to protein-calorie malnutrition ([place]CC[place] . Thrombocytopenia ([place] . Dysphagia  07/29/2019  . Infarction of left basal ganglia ([place]) 07/18/2019  . Hypernatremia   . Leukocytosis   . Essential hypertension   . Global aphasia   . Cytotoxic brain edema ([place]) 07/10/2019  . ICH (intracerebral hemorrhage) ([place]) 07/09/2019  . Anxiety state 02/21/2015  . Cephalalgia 12/21/2014    Electa Sniff, PT, DPT, NCS 01/27/2020, 6:02 PM  [facility] [address] [place], [place], [address] Phone: [phone]   Fax:  [phone]  Name: Tirsa Gail Da Glennie Hawk MRN: [id number] Date of Birth: February 16, 1966

## 2020-02-03 ENCOUNTER — Encounter: Payer: Managed Care, Other (non HMO) | Admitting: Speech Pathology

## 2020-02-03 ENCOUNTER — Ambulatory Visit: Payer: Managed Care, Other (non HMO)

## 2020-02-03 ENCOUNTER — Other Ambulatory Visit: Payer: Self-pay

## 2020-02-03 DIAGNOSIS — M6281 Muscle weakness (generalized): Secondary | ICD-10-CM | POA: Insufficient documentation

## 2020-02-03 DIAGNOSIS — R2689 Other abnormalities of gait and mobility: Secondary | ICD-10-CM | POA: Insufficient documentation

## 2020-02-03 DIAGNOSIS — I610 Nontraumatic intracerebral hemorrhage in hemisphere, subcortical: Secondary | ICD-10-CM | POA: Diagnosis not present

## 2020-02-03 DIAGNOSIS — I69153 Hemiplegia and hemiparesis following nontraumatic intracerebral hemorrhage affecting right non-dominant side: Secondary | ICD-10-CM | POA: Insufficient documentation

## 2020-02-03 NOTE — Therapy (Signed)
[facility] [address] [place], [place], [address] Phone: [phone]   Fax:  [phone]  Physical Therapy Treatment  Patient Details  Name: Melanie Madden MRN: [id number] Date of Birth: [birth year] Referring Provider (PT): Lauraine Rinne, [place]   Encounter Date: 02/03/2020   PT End of Session - 02/03/20 1232    Visit Number 40    Number of Visits 54    Date for PT Re-Evaluation 03/29/20    Authorization Type [employer]- 60 VL combined PT/OT/ST - counts as 1 visit if seen on same day.  Not sure if 60 is hard max.  $50 copay each day - make sure PT/OT/ST on same day if possible    Authorization - Visit Number --   PT/OT/ST days combined   Authorization - Number of Visits 60    PT Start Time 1230    PT Stop Time 1314    PT Time Calculation (min) 44 min    Equipment Utilized During Treatment --   pt's R AFO; dance shoe cover to aid in foot clearance   Activity Tolerance Patient tolerated treatment well    Behavior During Therapy Impulsive;Agitated           Past Medical History:  Diagnosis Date  . Allergy   . Anxiety   . Arthritis   . High triglycerides   . Kidney stones   . Migraines   . Recurrent sinus infections     Past Surgical History:  Procedure Laterality Date  . ANTERIOR CRUCIATE LIGAMENT REPAIR Left   . BUNIONECTOMY Right   . LASIK    . LITHOTRIPSY    . MENISCUS REPAIR Left   . NASAL SEPTUM SURGERY    . SHOULDER SURGERY    . VARICOSE VEIN SURGERY Left     There were no vitals filed for this visit.   Subjective Assessment - 02/03/20 1232    Subjective Pt walked in with hemiwalker. Denies any falls.    Patient is accompained by: --   Caregivers   Pertinent History anxiety, migraines, HTN    Patient Stated Goals Per husband report they would like her to get stronger on right, improve mobility, and work on speech.    Currently in Pain? No/denies                              [facility] Adult PT Treatment/Exercise - 02/03/20 1232      Transfers   Transfers Sit to Stand;Stand to Sit    Sit to Stand 5: Supervision;4: Min guard    Sit to Stand Details Verbal cues for technique    Five time sit to stand comments  19.85 sec from mat with LUE support    Stand to Sit 5: Supervision;4: Min guard      Ambulation/Gait   Ambulation/Gait Yes    Ambulation/Gait Assistance 5: Supervision;4: Min guard    Ambulation/Gait Assistance Details Pt was given tactile cues to left shoulder and right hip to try to get more upright posture and decrease left trunk lean. Pt does continue to circumduct at right leg to advance using trunk to help. She did shift weight over right leg more after advancing though.    Ambulation Distance (Feet) 345 Feet   230' x 1 at end of session   Assistive device Hemi-walker   right AFO   Gait Pattern Step-to pattern;Step-through pattern;Decreased step length - right;Decreased stance  time - right;Decreased weight shift to right;Right circumduction    Ambulation Surface Level;Indoor      Neuro Re-ed    Neuro Re-ed Details  Sit to stand edge of mat with pushing with LUE in to therapists hand x 3 then partial squats edge of mat with pushing LUE in to therapist's hand x 10 out in front to elongate left trunk. Standing with pushing into rehab tech's hand overhead to elongate left trunk stepping forward and back with left foot x 10 with PT CGA at pelvis and watching right knee to prevent buckling. Then switched to stepping forward and back with right leg x 5 in this position. More difficulty to advance right leg as not leaning to left as much.       Exercises   Exercises Other Exercises    Other Exercises  Seated with roll under right foot working on knee extension and flexion x 10 with mod assist to flex and muscle tapping to help facilitate and then partial range in extension. Standing attempted to isolate right hip flexion more  but mod assist to try to reduce hip hike and lift leg. Supine bridges x 10 with PT stabilizing right leg then placed roll between knees and cued patient to squeeze while bridging x 10. Pt able to keep right hip in more neutral position with this. Right hip flexion/ext on red physioball with PT stabilizing foot in neutral x 10 min assist with flexion.                    PT Short Term Goals - 02/03/20 1241      PT SHORT TERM GOAL #1   Title Pt will decrease 5 x sit to stand from 22 sec to <20 sec from mat for improved balance and functional strength.    Baseline 22 sec on 12/30/19, 02/03/20 5 x sit to stand=19.85 sec    Time 4    Period Weeks    Status Achieved    Target Date 01/29/20      PT SHORT TERM GOAL #2   Title Pt will ambulate with LRAD min assist >300' on level surfaces for improved household mobility with caregiver assist.    Baseline 345' with hemiwalker SBA/CGA    Time 4    Period Weeks    Status Achieved    Target Date 01/29/20             PT Long Term Goals - 12/30/19 2021      PT LONG TERM GOAL #1   Title Pt will be able to perform progressive HEP for strengthening, balance and mobility with assist of husband to continued gains at home.    Baseline PT continues to add to HEP    Time 8    Period Weeks    Status On-going    Target Date 02/28/20      PT LONG TERM GOAL #2   Title Pt will increase Berg Balance from 20/56 to >26/ 56 for improved balance.    Baseline 20/56 on 12/30/19    Time 8    Period Weeks    Status New    Target Date 02/28/20      PT LONG TERM GOAL #3   Title Pt will ambulate >400' CGA with LRAD  for improved gait ability in home.    Baseline 230' with RW min/mod assist, 61' with hemiwalker CGA.    Time 8    Period Weeks    Status  Revised    Target Date 02/28/20      PT LONG TERM GOAL #4   Title Pt will increase gait speed from 0.53ms to >0.352m for improved gait safety.    Baseline 0.1177mon 12/30/19    Time 8    Period Weeks     Status New    Target Date 02/28/20                 Plan - 02/03/20 1427    Clinical Impression Statement Pt met STGs today with decreasing 5 x sit to stand time and increasing gait distance with less assist. PT continues to focus on trying to get more muscle activation on RLE and working on elongating left trunk with gait to decrease left lateral lean. Pt continues to benefit from skilled PT to address these deficits.    Personal Factors and Comorbidities Comorbidity 3+    Comorbidities anxiety, migraines, HTN    Examination-Activity Limitations Bathing;Bed Mobility;Stand;Locomotion Level;Squat;Stairs;Dressing;Transfers    Examination-Participation Restrictions Community Activity;Driving;Shop;Laundry;Meal Prep    Stability/Clinical Decision Making Evolving/Moderate complexity    Rehab Potential Good    PT Frequency 2x / week    PT Duration 8 weeks    PT Treatment/Interventions ADLs/Self Care Home Management;Electrical Stimulation;DME Instruction;Gait training;Stair training;Functional mobility training;Therapeutic activities;Therapeutic exercise;Orthotic Fit/Training;Patient/family education;Neuromuscular re-education;Balance training;Manual techniques;Passive range of motion;Vestibular;Wheelchair mobility training    PT Next Visit Plan Seated physioball activities.  Walking with LUE reaching up and to the L pushing into tech/aide's/student's hand for more upright posture and R activation.  Continue with activities in standing to facilitate increased right weight shift and R hip flexion/initiation of R swing phase, continue gait with hemiwalker with givmohr sling use.  Pt responded well to short bout on SciFit.  Gait with her UE on your shoulders to get more upright posture, slow her down and get more weight shift.    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 Nontraumatic subcortical hemorrhage of left cerebral hemisphere (HCCBrooten1/28/2021  . Acute blood loss anemia   . Hypoalbuminemia due to protein-calorie malnutrition (HCCDelta . Thrombocytopenia (HCCH[place] . Dysphagia 07/29/2019  . Infarction of left basal ganglia (HCCZeeland2/19/2020  . Hypernatremia   . Leukocytosis   . Essential hypertension   . Global aphasia   . Cytotoxic brain edema (HCCSal[place]2/05/2019  . ICH (intracerebral hemorrhage) (HCCFalkner2/04/2019  . Anxiety state 02/21/2015  . Cephalalgia 12/21/2014    EmiElecta SniffT, DPT, NCS 02/03/2020, 2:30 PM  [place]one: [phone]:  [phone]: Melanie Madden: [phone] of Birth: 06/10/23/1967

## 2020-02-05 ENCOUNTER — Ambulatory Visit: Payer: Managed Care, Other (non HMO)

## 2020-02-05 ENCOUNTER — Other Ambulatory Visit: Payer: Self-pay

## 2020-02-05 ENCOUNTER — Encounter: Payer: Self-pay | Admitting: Physical Medicine & Rehabilitation

## 2020-02-05 ENCOUNTER — Encounter
Payer: Managed Care, Other (non HMO) | Attending: Physical Medicine & Rehabilitation | Admitting: Physical Medicine & Rehabilitation

## 2020-02-05 VITALS — BP 148/88 | HR 112 | Temp 98.8°F | Ht 67.0 in | Wt 129.2 lb

## 2020-02-05 DIAGNOSIS — I610 Nontraumatic intracerebral hemorrhage in hemisphere, subcortical: Secondary | ICD-10-CM | POA: Diagnosis not present

## 2020-02-05 DIAGNOSIS — I69153 Hemiplegia and hemiparesis following nontraumatic intracerebral hemorrhage affecting right non-dominant side: Secondary | ICD-10-CM

## 2020-02-05 DIAGNOSIS — R2689 Other abnormalities of gait and mobility: Secondary | ICD-10-CM

## 2020-02-05 DIAGNOSIS — M6281 Muscle weakness (generalized): Secondary | ICD-10-CM

## 2020-02-05 MED ORDER — GABAPENTIN 100 MG PO CAPS
100.0000 mg | ORAL_CAPSULE | Freq: Three times a day (TID) | ORAL | 2 refills | Status: DC
Start: 2020-02-05 — End: 2020-05-06

## 2020-02-05 NOTE — Therapy (Signed)
[facility] [address] [place], [place], [address] Phone: [phone]   Fax:  [phone]  Physical Therapy Treatment  Patient Details  Name: Melanie Madden MRN: [id number] Date of Birth: 08/20/1965 Referring Provider (PT): Lauraine Rinne, [place]   Encounter Date: 02/05/2020   PT End of Session - 02/05/20 1331    Visit Number 41    Number of Visits 54    Date for PT Re-Evaluation 03/29/20    Authorization Type [employer]- 60 VL combined PT/OT/ST - counts as 1 visit if seen on same day.  Not sure if 60 is hard max.  $50 copay each day - make sure PT/OT/ST on same day if possible    Authorization - Visit Number --   PT/OT/ST days combined   Authorization - Number of Visits 60    PT Start Time 1325    PT Stop Time 1404    PT Time Calculation (min) 39 min    Equipment Utilized During Treatment --   pt's R AFO; dance shoe cover to aid in foot clearance   Activity Tolerance Patient tolerated treatment well    Behavior During Therapy Impulsive;Agitated           Past Medical History:  Diagnosis Date  . Allergy   . Anxiety   . Arthritis   . High triglycerides   . Kidney stones   . Migraines   . Recurrent sinus infections     Past Surgical History:  Procedure Laterality Date  . ANTERIOR CRUCIATE LIGAMENT REPAIR Left   . BUNIONECTOMY Right   . LASIK    . LITHOTRIPSY    . MENISCUS REPAIR Left   . NASAL SEPTUM SURGERY    . SHOULDER SURGERY    . VARICOSE VEIN SURGERY Left     There were no vitals filed for this visit.   Subjective Assessment - 02/05/20 1331    Subjective Pt brought in brace for ankle that she is going to use just at night to get to bathroom to protect ankle for PT to check out.    Patient is accompained by: --   Caregivers   Pertinent History anxiety, migraines, HTN    Patient Stated Goals Per husband report they would like her to get stronger on right, improve mobility, and work on  speech.    Currently in Pain? No/denies                             [facility] Adult PT Treatment/Exercise - 02/05/20 1401      Transfers   Transfers Sit to Stand;Stand to Sit    Sit to Stand 5: Supervision    Stand to Sit 5: Supervision      Ambulation/Gait   Ambulation/Gait Yes    Ambulation/Gait Assistance 5: Supervision;4: Min guard    Ambulation/Gait Assistance Details Tactile cues to left trunk and right hip to cue for more erect posture and try to decreased left trunk lean.    Ambulation Distance (Feet) 115 Feet    Assistive device Hemi-walker   right AFO   Gait Pattern Step-to pattern;Step-through pattern;Decreased stance time - right;Decreased step length - right;Decreased hip/knee flexion - right;Poor foot clearance - right;Lateral trunk lean to left    Ambulation Surface Level;Indoor    Gait Comments PT trialed ASO that husband had purchased for night time to get to bathroom. Ambulated 15' CGA with hemiwalker. Pt's right ankle  wanting to supinate but brace did prevent. PT, however, did not advise to use as more cumbersome to get on and would not be very comfortable to wear all night if that was the purpose with the plastic piece. Discussed with caregiver PT recommendation to pass along to husband. Pt in agreement that ASO was not easy to donn quickly.       Neuro Re-ed    Neuro Re-ed Details  Standing in front of mirror with stepping forward and back with LLE 10 x 2 with PT assisting to stabilize on right leg. Instructed to try to keep more upright posture and PT assisted to faciliate. PT then had pt try to hold staggered stance with LLE forward and get upright posture x 3. PT standing in front of pt with pt's left hand on her shoulder working on more right weight shift with upright posture then pushing in to PTs hand with left UE overhead x 1 min to elongate left side during stance then adding in stepping forward and back with LLE with LUE in same position overhead  pushing. Sit to stand from mat with holding LUE out in front slightly overhead pushing in to PT hand x 10 min assist.  Seated on blue physioball with PT assisting to stabilize ball: working on ant/post pelvic tilt x 10, raising left arm up with pushing in to PT hand and back down x 10 for continued work on elongation, lateral weight shift with PT initiating at ball to work more on core stability x 10. PT was min assist throughout physioball activities for safety.                  PT Education - 02/05/20 1828    Education Details PT instructed caregiver that ASO brace was not ideal to wear all night as plastic piece could get uncomfortable. Stabilized ankle just barely enough with shoe donned with short gait distance but would be easier and safer to just donn AFO.    Person(s) Educated Patient;Caregiver(s)    Methods Explanation    Comprehension Verbalized understanding            PT Short Term Goals - 02/03/20 1241      PT SHORT TERM GOAL #1   Title Pt will decrease 5 x sit to stand from 22 sec to <20 sec from mat for improved balance and functional strength.    Baseline 22 sec on 12/30/19, 02/03/20 5 x sit to stand=19.85 sec    Time 4    Period Weeks    Status Achieved    Target Date 01/29/20      PT SHORT TERM GOAL #2   Title Pt will ambulate with LRAD min assist >300' on level surfaces for improved household mobility with caregiver assist.    Baseline 345' with hemiwalker SBA/CGA    Time 4    Period Weeks    Status Achieved    Target Date 01/29/20             PT Long Term Goals - 12/30/19 2021      PT LONG TERM GOAL #1   Title Pt will be able to perform progressive HEP for strengthening, balance and mobility with assist of husband to continued gains at home.    Baseline PT continues to add to HEP    Time 8    Period Weeks    Status On-going    Target Date 02/28/20      PT LONG TERM GOAL #  2   Title Pt will increase Berg Balance from 20/56 to >26/ 56 for improved  balance.    Baseline 20/56 on 12/30/19    Time 8    Period Weeks    Status New    Target Date 02/28/20      PT LONG TERM GOAL #3   Title Pt will ambulate >400' CGA with LRAD  for improved gait ability in home.    Baseline 230' with RW min/mod assist, 32' with hemiwalker CGA.    Time 8    Period Weeks    Status Revised    Target Date 02/28/20      PT LONG TERM GOAL #4   Title Pt will increase gait speed from 0.34m/s to >0.23m/s for improved gait safety.    Baseline 0.65m/s on 12/30/19    Time 8    Period Weeks    Status New    Target Date 02/28/20                 Plan - 02/05/20 1829    Clinical Impression Statement PT continued to work on gait training with improving right weight shift and more erect posture. Focused on NMR working on elongating left trunk. Pt continues to need max cuing for more erect posture.    Personal Factors and Comorbidities Comorbidity 3+    Comorbidities anxiety, migraines, HTN    Examination-Activity Limitations Bathing;Bed Mobility;Stand;Locomotion Level;Squat;Stairs;Dressing;Transfers    Examination-Participation Restrictions Community Activity;Driving;Shop;Laundry;Meal Prep    Stability/Clinical Decision Making Evolving/Moderate complexity    Rehab Potential Good    PT Frequency 2x / week    PT Duration 8 weeks    PT Treatment/Interventions ADLs/Self Care Home Management;Electrical Stimulation;DME Instruction;Gait training;Stair training;Functional mobility training;Therapeutic activities;Therapeutic exercise;Orthotic Fit/Training;Patient/family education;Neuromuscular re-education;Balance training;Manual techniques;Passive range of motion;Vestibular;Wheelchair mobility training    PT Next Visit Plan Seated physioball activities.  Walking with LUE reaching up and to the L pushing into tech/aide's/student's hand for more upright posture and R activation.  Continue with activities in standing to facilitate increased right weight shift and R hip  flexion/initiation of R swing phase, continue gait with hemiwalker with givmohr sling use.  Pt responded well to short bout on Sci[employer].  Gait with her UE on your shoulders to get more upright posture, slow her down and get more weight shift.    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 Nontraumatic subcortical hemorrhage of left cerebral hemisphere ([place]) 08/27/2019  . Acute blood loss anemia   . Hypoalbuminemia due to protein-calorie malnutrition ([place])   . Thrombocytopenia ([place])   . Dysphagia 07/29/2019  . Infarction of left basal ganglia ([place]) 07/18/2019  . Hypernatremia   . Leukocytosis   . Essential hypertension   . Global aphasia   . Cytotoxic brain edema ([place]) 07/10/2019  . ICH (intracerebral hemorrhage) (  [place]) 07/09/2019  . Anxiety state 02/21/2015  . Cephalalgia 12/21/2014    Electa Sniff, PT, DPT, NCS 02/05/2020, 6:35 PM  [facility] [address] [place], [place], [address] Phone: [phone]   Fax:  [phone]  Name: Melanie Madden MRN: [id number] Date of Birth: 1966/02/15

## 2020-02-05 NOTE — Progress Notes (Signed)
Subjective:    Patient ID: Melanie Madden, female    DOB: 06/28/1966, 54 y.o.   MRN: [id number]  HPI  54 year old female with large left basal ganglia nontraumatic hemorrhage onset December 2020 who returns for physical medicine and rehabilitation follow-up.  She continues with outpatient PT.  She is now ambulating with a hemiwalker as well as right AFO. 2 falls at home, no injury  Primary care provider Dr. Ernie Hew  Primary complaint is pain over the entire right side of the body including her face.  She is severely globally aphasic.  She communicates mainly with gestures She is accompanied by caregiver  Husband on phone asking for f/u with neuropsych Pain Inventory Average Pain 10 Pain Right Now 0 My pain is unable to verbalize due to stroke  In the last 24 hours, has pain interfered with the following? General activity 0 Relation with others 0 Enjoyment of life 0 What TIME of day is your pain at its worst? all--- her right side Sleep (in general) Fair  Pain is worse with: some activites Pain improves with: nothing Relief from Meds: no med  Mobility walk with assistance use a walker do you drive?  no needs help with transfers  Function disabled: date disabled . I need assistance with the following:  dressing, bathing, toileting, meal prep, household duties and shopping  Neuro/Psych weakness trouble walking depression  Prior Studies Any changes since last visit?  no  Physicians involved in your care Any changes since last visit?  no   Family History  Problem Relation Age of Onset  . Alzheimer's disease Mother   . Hypertension Mother   . Diabetes Mother   . Thyroid disease Mother   . Deep vein thrombosis Maternal Grandmother   . Colon cancer Neg Hx   . Colon polyps Neg Hx   . Esophageal cancer Neg Hx   . Pancreatic cancer Neg Hx   . Rectal cancer Neg Hx   . Stomach cancer Neg Hx   . Breast cancer Neg Hx    Social History    Socioeconomic History  . Marital status: Significant Other    Spouse name: Not on file  . Number of children: Not on file  . Years of education: Not on file  . Highest education level: Not on file  Occupational History  . Not on file  Tobacco Use  . Smoking status: Former Research scientist (life sciences)  . Smokeless tobacco: Never Used  Vaping Use  . Vaping Use: Never used  Substance and Sexual Activity  . Alcohol use: Yes    Alcohol/week: 0.0 standard drinks    Comment: social   . Drug use: No  . Sexual activity: Yes    Partners: Male  Other Topics Concern  . Not on file  Social History Narrative  . Not on file   Social Determinants of Health   Financial Resource Strain:   . Difficulty of Paying Living Expenses:   Food Insecurity:   . Worried About Charity fundraiser in the Last Year:   . Arboriculturist in the Last Year:   Transportation Needs:   . Film/video editor (Medical):   Marland Kitchen Lack of Transportation (Non-Medical):   Physical Activity:   . Days of Exercise per Week:   . Minutes of Exercise per Session:   Stress:   . Feeling of Stress :   Social Connections:   . Frequency of Communication with Friends and Family:   .  Frequency of Social Gatherings with Friends and Family:   . Attends Religious Services:   . Active Member of Clubs or Organizations:   . Attends Archivist Meetings:   Marland Kitchen Marital Status:    Past Surgical History:  Procedure Laterality Date  . ANTERIOR CRUCIATE LIGAMENT REPAIR Left   . BUNIONECTOMY Right   . LASIK    . LITHOTRIPSY    . MENISCUS REPAIR Left   . NASAL SEPTUM SURGERY    . SHOULDER SURGERY    . VARICOSE VEIN SURGERY Left    Past Medical History:  Diagnosis Date  . Allergy   . Anxiety   . Arthritis   . High triglycerides   . Kidney stones   . Migraines   . Recurrent sinus infections    BP (!) 148/88   Pulse (!) 112   Temp 98.8 F (37.1 C)   Ht 5\' 7"  (1.702 m)   Wt 129 lb 3.2 oz (58.6 kg)   BMI 20.24 kg/m   Opioid Risk  Score:   Fall Risk Score:  `1  Depression screen PHQ 2/9  Depression screen [facility] 2/9 02/05/2020 09/25/2019  Decreased Interest 3 0  Down, Depressed, Hopeless 3 1  PHQ - 2 Score 6 1  Altered sleeping - 0  Tired, decreased energy - 0  Change in appetite - 0  Feeling bad or failure about yourself  - 0  Trouble concentrating - 0  Moving slowly or fidgety/restless - 1  Suicidal thoughts - 0  PHQ-9 Score - 2  Difficult doing work/chores - Somewhat difficult    Review of Systems  Constitutional: Negative.   HENT:       Pain under eye and over eye and jaw right side  Eyes: Positive for visual disturbance.  Respiratory: Negative.   Cardiovascular: Negative.   Gastrointestinal: Negative.   Endocrine: Negative.   Genitourinary: Negative.   Musculoskeletal: Positive for gait problem.  Skin: Negative.   Allergic/Immunologic: Negative.   Neurological: Positive for weakness.  Hematological: Negative.   Psychiatric/Behavioral: Positive for dysphoric mood.  All other systems reviewed and are negative.      Objective:   Physical Exam Vitals and nursing note reviewed.  HENT:     Head: Normocephalic and atraumatic.  Eyes:     General: No scleral icterus.       Right eye: No discharge.        Left eye: No discharge.     Extraocular Movements: Extraocular movements intact.     Conjunctiva/sclera: Conjunctivae normal.     Pupils: Pupils are equal, round, and reactive to light.  Cardiovascular:     Rate and Rhythm: Normal rate and regular rhythm.     Heart sounds: Normal heart sounds.  Pulmonary:     Effort: Pulmonary effort is normal. No respiratory distress.     Breath sounds: Normal breath sounds. No stridor.  Neurological:     Mental Status: She is alert. Mental status is at baseline. She is confused.     Cranial Nerves: Cranial nerve deficit, dysarthria and facial asymmetry present.     Motor: Weakness, atrophy and abnormal muscle tone present.     Coordination: Coordination  abnormal.     Gait: Gait abnormal.     Deep Tendon Reflexes:     Reflex Scores:      Tricep reflexes are 0 on the right side.      Bicep reflexes are 0 on the right side.  Brachioradialis reflexes are 0 on the right side.      Patellar reflexes are 0 on the right side.      Achilles reflexes are 0 on the right side.    Comments: Right side is hyporeflexic There is no hypersensitivity to touch in the right arm or leg. She indicates she does not feel touch on the right arm and right leg but does feel touch on the face.  Patient communicates by yes knows although they are not always consistent            Assessment & Plan:  1.  Left basal ganglia ICH some functional mobility gains without strength improvements Cont PT to improve balance, which is in part due to impulsivity  Refer to neuro needs re imaging per prior neurorad note  2.  Right sided pain , central post stroke pain, trial gabapentin 100mg  TID RTC 3 mo  3.  Adjustment disorder post CVA- f/u neuropsych

## 2020-02-05 NOTE — Patient Instructions (Signed)
Neurology would like to repeat scan, referred to [facility]  Also referred to Dr Sima Matas Neuropsych  Start gabapentin for nerve pain , may need to increase dose over time

## 2020-02-08 ENCOUNTER — Encounter: Payer: Self-pay | Admitting: Psychology

## 2020-02-08 ENCOUNTER — Other Ambulatory Visit: Payer: Self-pay

## 2020-02-08 ENCOUNTER — Encounter: Payer: Managed Care, Other (non HMO) | Admitting: Occupational Therapy

## 2020-02-08 ENCOUNTER — Ambulatory Visit: Payer: Managed Care, Other (non HMO) | Admitting: Physical Therapy

## 2020-02-08 VITALS — BP 146/96 | HR 110

## 2020-02-08 DIAGNOSIS — M6281 Muscle weakness (generalized): Secondary | ICD-10-CM

## 2020-02-08 DIAGNOSIS — I610 Nontraumatic intracerebral hemorrhage in hemisphere, subcortical: Secondary | ICD-10-CM | POA: Diagnosis not present

## 2020-02-08 DIAGNOSIS — R2689 Other abnormalities of gait and mobility: Secondary | ICD-10-CM

## 2020-02-08 NOTE — Therapy (Signed)
[facility] [address] [place], [place], [address] Phone: [phone]   Fax:  [phone]  Physical Therapy Treatment  Patient Details  Name: Melanie Madden MRN: [id number] Date of Birth: 04-Sep-1965 Referring Provider (PT): Lauraine Rinne, [place]   Encounter Date: 02/08/2020   PT End of Session - 02/08/20 1601    Visit Number 42    Number of Visits 54    Date for PT Re-Evaluation 03/29/20    Authorization Type [employer]- 60 VL combined PT/OT/ST - counts as 1 visit if seen on same day.  Not sure if 60 is hard max.  $50 copay each day - make sure PT/OT/ST on same day if possible    Authorization - Visit Number --   PT/OT/ST days combined   Authorization - Number of Visits 60    PT Start Time 1404    PT Stop Time 1443    PT Time Calculation (min) 39 min    Equipment Utilized During Treatment --   pt's R AFO   Activity Tolerance Patient tolerated treatment well    Behavior During Therapy Impulsive;Agitated           Past Medical History:  Diagnosis Date  . Allergy   . Anxiety   . Arthritis   . High triglycerides   . Kidney stones   . Migraines   . Recurrent sinus infections     Past Surgical History:  Procedure Laterality Date  . ANTERIOR CRUCIATE LIGAMENT REPAIR Left   . BUNIONECTOMY Right   . LASIK    . LITHOTRIPSY    . MENISCUS REPAIR Left   . NASAL SEPTUM SURGERY    . SHOULDER SURGERY    . VARICOSE VEIN SURGERY Left     Vitals:   02/08/20 1413 02/08/20 1414  BP: (!) 153/98 (!) 146/96  Pulse: (!) 109 (!) 110  Above measures checked with automatic cuff; manual cuff also 146/96.  Pt does not report any symptoms; answers no to questions about headache, other signs and symptoms (after initial pointing to head at beginning of session). Final BP check after activity:  150/97. (See additional self-care/education to pt/caregiver below)   Subjective Assessment - 02/08/20 1410    Subjective Pt  agrees when asked if no changes, no falls.  Does seem to report slight headache.    Patient is accompained by: --   Caregivers   Pertinent History anxiety, migraines, HTN    Patient Stated Goals Per husband report they would like her to get stronger on right, improve mobility, and work on speech.    Currently in Pain? Yes   slight headache, hot; does not rate                        Neuro Re-education: Seated on blue physioball with PT assisting to stabilize ball: working on ant/post pelvic tilt x 10, raising left arm up back down x 10 for continued work on elongation, lateral weight shift with PT initiating at ball to work more on core stability x 10.  PT provides cues after about 2-3 reps to reset for taller posture.  Pt able to self-correct with VCs.  With pt in tall posture (again, correcting with cues after 2-3 reps), PT assists with RLE hip/knee flexion x 10, then pt performs LLE hip/knee flexion x 10.   PT was min assist throughout physioball activities for safety.    Standing in front of mirror  with stepping forward and back with LLE 10 with PT assisting to stabilize on right leg. Instructed to try to keep more upright posture and PT assisted to faciliate. PT then had pt try to hold staggered stance with LLE forward and get upright posture x 5.  PT provides tactile cues through R gluts/hips for more upright posture in stagger stance.  Then PT provides VCs for pt to initiate R knee flexion x 3 reps.  Transitioned to PT standing in front of pt with pt's left hand on her shoulder working on more right weight shift with upright posture; with R knee flexed, pt able to actively (after VCs provided) extend R knee for brief improved weightshift x 3 reps, 2 sets.     [facility] Adult PT Treatment/Exercise - 02/08/20 0001      Transfers   Transfers Sit to Stand;Stand to Sit    Sit to Stand 5: Supervision    Stand to Sit 5: Supervision      Ambulation/Gait   Ambulation/Gait Yes     Ambulation/Gait Assistance 5: Supervision;4: Min guard    Ambulation/Gait Assistance Details Into and out of therapy session only today; tactile cues at L shoulder, R hip to assist with initial R hip elevation for swing phase.    Ambulation Distance (Feet) 65 Feet   x 2   Assistive device Hemi-walker   R AFO   Gait Pattern Step-to pattern;Step-through pattern;Decreased stance time - right;Decreased step length - right;Decreased hip/knee flexion - right;Poor foot clearance - right;Lateral trunk lean to left    Ambulation Surface Level;Indoor                  PT Education - 02/08/20 1559    Education Details Discussed with pt and caregiver elevated BP measures (still within parameters to work, but proceeding with caution); reminded pt/caregiver of CVA warning signs/symptoms and to call 911 if any occur    Person(s) Educated Patient   Caregiver at end of session   Methods Explanation    Comprehension Verbalized understanding   caregiver verbalizes understanding           PT Short Term Goals - 02/03/20 1241      PT SHORT TERM GOAL #1   Title Pt will decrease 5 x sit to stand from 22 sec to <20 sec from mat for improved balance and functional strength.    Baseline 22 sec on 12/30/19, 02/03/20 5 x sit to stand=19.85 sec    Time 4    Period Weeks    Status Achieved    Target Date 01/29/20      PT SHORT TERM GOAL #2   Title Pt will ambulate with LRAD min assist >300' on level surfaces for improved household mobility with caregiver assist.    Baseline 345' with hemiwalker SBA/CGA    Time 4    Period Weeks    Status Achieved    Target Date 01/29/20             PT Long Term Goals - 12/30/19 2021      PT LONG TERM GOAL #1   Title Pt will be able to perform progressive HEP for strengthening, balance and mobility with assist of husband to continued gains at home.    Baseline PT continues to add to HEP    Time 8    Period Weeks    Status On-going    Target Date 02/28/20       PT LONG TERM GOAL #2  Title Pt will increase Berg Balance from 20/56 to >26/ 56 for improved balance.    Baseline 20/56 on 12/30/19    Time 8    Period Weeks    Status New    Target Date 02/28/20      PT LONG TERM GOAL #3   Title Pt will ambulate >400' CGA with LRAD  for improved gait ability in home.    Baseline 230' with RW min/mod assist, 64' with hemiwalker CGA.    Time 8    Period Weeks    Status Revised    Target Date 02/28/20      PT LONG TERM GOAL #4   Title Pt will increase gait speed from 0.21m/s to >0.2m/s for improved gait safety.    Baseline 0.39m/s on 12/30/19    Time 8    Period Weeks    Status New    Target Date 02/28/20                 Plan - 02/08/20 1602    Clinical Impression Statement BP slightly elevated today, but not above parameters for treatment session to be discontinued.  Focused on NMR for L trunk elongation, pelvic/trunk control on physioball and standing trunk control with visual feedback at mirror.  Pt demo active knee extension in stagger stance and wide BOS stance, but fatigues after several reps.    Personal Factors and Comorbidities Comorbidity 3+    Comorbidities anxiety, migraines, HTN    Examination-Activity Limitations Bathing;Bed Mobility;Stand;Locomotion Level;Squat;Stairs;Dressing;Transfers    Examination-Participation Restrictions Community Activity;Driving;Shop;Laundry;Meal Prep    Stability/Clinical Decision Making Evolving/Moderate complexity    Rehab Potential Good    PT Frequency 2x / week    PT Duration 8 weeks    PT Treatment/Interventions ADLs/Self Care Home Management;Electrical Stimulation;DME Instruction;Gait training;Stair training;Functional mobility training;Therapeutic activities;Therapeutic exercise;Orthotic Fit/Training;Patient/family education;Neuromuscular re-education;Balance training;Manual techniques;Passive range of motion;Vestibular;Wheelchair mobility training    PT Next Visit Plan May need to check BP;  Seated physioball activities.  Walking with LUE reaching up and to the L pushing into tech/aide's/student's hand for more upright posture and R activation.  Continue with activities in standing to facilitate increased right weight shift and R hip flexion/initiation of R swing phase, continue gait with hemiwalker with givmohr sling use.  Pt responded well to short bout on SciFit.  Gait with her UE on your shoulders to get more upright posture, slow her down and get more weight shift.    Consulted and Agree with Plan of Care Patient    Family Member Consulted Caregiver           Patient will benefit from skilled therapeutic intervention in order to improve the following deficits and impairments:  Abnormal gait, Decreased activity tolerance, Decreased balance, Decreased cognition, Decreased knowledge of use of DME, Decreased mobility, Decreased range of motion, Decreased safety awareness, Decreased strength, Impaired sensation  Visit Diagnosis: Muscle weakness (generalized)  Other abnormalities of gait and mobility     Problem List Patient Active Problem List   Diagnosis Date Noted  . 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 Nontraumatic subcortical hemorrhage of left cerebral hemisphere ([place]) 08/27/2019  . Acute blood loss anemia   . Hypoalbuminemia due to protein-calorie malnutrition ([place])   . Thrombocytopenia ([place])   . Dysphagia 07/29/2019  . Infarction of left basal ganglia ([place]) 07/18/2019  . Hypernatremia   . Leukocytosis   . Essential hypertension   . Global aphasia   . Cytotoxic brain edema (  [place]) 07/10/2019  . ICH (intracerebral hemorrhage) ([place]) 07/09/2019  . Anxiety state 02/21/2015  . Cephalalgia 12/21/2014    Britian Jentz W. 02/08/2020, 4:06 PM Frazier Butt., PT  [facility] [address] [place], [place], [address] Phone: [phone]   Fax:  [phone]  Name:  Melanie Madden MRN: [id number] Date of Birth: 02-10-1966

## 2020-02-11 ENCOUNTER — Encounter: Payer: Managed Care, Other (non HMO) | Admitting: Physical Medicine & Rehabilitation

## 2020-02-12 ENCOUNTER — Other Ambulatory Visit: Payer: Self-pay

## 2020-02-12 ENCOUNTER — Ambulatory Visit: Payer: Managed Care, Other (non HMO)

## 2020-02-12 VITALS — BP 152/88

## 2020-02-12 DIAGNOSIS — I610 Nontraumatic intracerebral hemorrhage in hemisphere, subcortical: Secondary | ICD-10-CM | POA: Diagnosis not present

## 2020-02-12 DIAGNOSIS — I69153 Hemiplegia and hemiparesis following nontraumatic intracerebral hemorrhage affecting right non-dominant side: Secondary | ICD-10-CM

## 2020-02-12 DIAGNOSIS — R2689 Other abnormalities of gait and mobility: Secondary | ICD-10-CM

## 2020-02-12 DIAGNOSIS — M6281 Muscle weakness (generalized): Secondary | ICD-10-CM

## 2020-02-12 NOTE — Patient Instructions (Addendum)
Access Code: [email or web address] URL: [email or web address] Date: 02/12/2020 Prepared by: Cherly Anderson  Exercises Bridge with Arms at [facility] and Feet on Swiss Ball - 1 x daily - 7 x weekly - 2 sets - 10 reps Supine Single Leg Hip and Knee Flexion ROM with Swiss Ball - 1 x daily - 7 x weekly - 2 sets - 10 reps Hip Abduction and Adduction Caregiver PROM - 1 x daily - 7 x weekly - 2 sets - 10 reps Sit to Stand - 1 x daily - 7 x weekly - 2 sets - 5 reps Side to Side Weight Shift with Counter Support - 1 x daily - 7 x weekly - 2 sets - 10 reps

## 2020-02-12 NOTE — Therapy (Signed)
[facility] [address] [place], [place], [address] Phone: [phone]   Fax:  [phone]  Physical Therapy Treatment  Patient Details  Name: Melanie Madden MRN: [id number] Date of Birth: Oct 21, 1965 Referring Provider (PT): Lauraine Rinne, [place]   Encounter Date: 02/12/2020   PT End of Session - 02/12/20 1357    Visit Number 43    Number of Visits 54    Date for PT Re-Evaluation 03/29/20    Authorization Type [employer]- 60 VL combined PT/OT/ST - counts as 1 visit if seen on same day.  Not sure if 60 is hard max.  $50 copay each day - make sure PT/OT/ST on same day if possible    Authorization - Visit Number --   PT/OT/ST days combined   Authorization - Number of Visits 60    PT Start Time 1350    PT Stop Time 1441    PT Time Calculation (min) 51 min    Equipment Utilized During Treatment Gait belt   pt's R AFO; dance shoe cover to aid in foot clearance   Activity Tolerance Patient tolerated treatment well    Behavior During Therapy Impulsive;Agitated           Past Medical History:  Diagnosis Date  . Allergy   . Anxiety   . Arthritis   . High triglycerides   . Kidney stones   . Migraines   . Recurrent sinus infections     Past Surgical History:  Procedure Laterality Date  . ANTERIOR CRUCIATE LIGAMENT REPAIR Left   . BUNIONECTOMY Right   . LASIK    . LITHOTRIPSY    . MENISCUS REPAIR Left   . NASAL SEPTUM SURGERY    . SHOULDER SURGERY    . VARICOSE VEIN SURGERY Left     Vitals:   02/12/20 1359  BP: (!) 152/88     Subjective Assessment - 02/12/20 1357    Subjective Pt walked in with hemiwalker. Shakes head no to if had any falls or headaches.    Patient is accompained by: --   Caregivers   Pertinent History anxiety, migraines, HTN    Patient Stated Goals Per husband report they would like her to get stronger on right, improve mobility, and work on speech.    Currently in Pain?  No/denies                             [facility] Adult PT Treatment/Exercise - 02/12/20 1357      Transfers   Transfers Sit to Stand;Stand to Sit    Sit to Stand 5: Supervision    Sit to Stand Details Tactile cues for weight shifting    Stand to Sit 5: Supervision    Stand to Sit Details (indicate cue type and reason) Visual cues/gestures for precautions/safety      Ambulation/Gait   Ambulation/Gait Yes    Ambulation/Gait Assistance 5: Supervision;4: Min guard    Ambulation/Gait Assistance Details While walking in to clinic pt tried to fit through narrow pathway despite cuing to go around and needed min assist to regain balance as right foot got stuck and lost balance to left and posterior. PT provided cues at left shoulder and right hip to try to get more upright posture.    Ambulation Distance (Feet) 230 Feet    Assistive device Hemi-walker   right AFO   Gait Pattern Step-through pattern;Decreased hip/knee flexion -  right;Decreased stance time - right;Decreased step length - left;Decreased weight shift to right    Ambulation Surface Level;Indoor      Neuro Re-ed    Neuro Re-ed Details  Standing in front of mat at hemiwalker: weight shifting side to side with PT in front trying to help facilitate shift to right. Pt initially just sticking right hip out and not moving trunk so PT helped to facilitate at trunk as well as verbal cuing. Stepping forward and back with LLE x 10 with PT assisting to facilitate right weight shift. Had pt stand with staggered stance with left foot forward and cued for upright posture x 10 sec.  In // bars: standing on rockerboard positioned lateral min assist to get on board with rehab tech standing by for safety. Mirror in front for visual cues. Had pt try to get board level with PT providing assist at right knee and posterior pelvis to try to encourage more extension as right knee flexed. Able to shift over some to get board more level with PT assist in  this manner. Pt was cued to try to look up in to mirror for more upright posture. Attempted to have pt let go with LUE but only able to briefly release. Performed x 3 min.  Standing in // bars with mirror for visual cues off board trying to work on upright posture with more right weight shift. PT was behind pt and tried to cue by having pt try to block therapist from being seen in mirror. Pt still needed min assist to increase right weight shift more from trunk.      Exercises   Exercises Other Exercises    Other Exercises  Supine right hip flexion/ext with leg on red physioball max assist of PT in to flexion and min assist in to extension with verbal cuing as well. Bridges with PT stabilizing right leg 10 x 2, right hip abduction mod assist x 10 with PT supporting under leg. Pt utilizing more trunk to assist with hip abduction. Caregiver present and observing throughout so she could assist at home. Performed sit to stand 5 x 2 with PT helping to facilitate more right weight shift.                   PT Education - 02/12/20 1506    Education Details PT discussed with pt and aide about importance of performing exercises at home. Aide reporting she tries to get pt to do daily and she always refuses. Will not get out of car to walk at all when they drive to part and just sits in car. PT reviewed HEP with both patient and aide and added to more functional exercises. Pt becomes very agitated whenever PT addresses aide or aide asks questions/gives feedback. PT attempted to explain to patient importance of letting others help her as they are only trying because they care.    Person(s) Educated Patient;Caregiver(s)    Methods Explanation;Demonstration;Handout    Comprehension Verbalized understanding;Returned demonstration            PT Short Term Goals - 02/03/20 1241      PT SHORT TERM GOAL #1   Title Pt will decrease 5 x sit to stand from 22 sec to <20 sec from mat for improved balance and  functional strength.    Baseline 22 sec on 12/30/19, 02/03/20 5 x sit to stand=19.85 sec    Time 4    Period Weeks    Status Achieved  Target Date 01/29/20      PT SHORT TERM GOAL #2   Title Pt will ambulate with LRAD min assist >300' on level surfaces for improved household mobility with caregiver assist.    Baseline 345' with hemiwalker SBA/CGA    Time 4    Period Weeks    Status Achieved    Target Date 01/29/20             PT Long Term Goals - 12/30/19 2021      PT LONG TERM GOAL #1   Title Pt will be able to perform progressive HEP for strengthening, balance and mobility with assist of husband to continued gains at home.    Baseline PT continues to add to HEP    Time 8    Period Weeks    Status On-going    Target Date 02/28/20      PT LONG TERM GOAL #2   Title Pt will increase Berg Balance from 20/56 to >26/ 56 for improved balance.    Baseline 20/56 on 12/30/19    Time 8    Period Weeks    Status New    Target Date 02/28/20      PT LONG TERM GOAL #3   Title Pt will ambulate >400' CGA with LRAD  for improved gait ability in home.    Baseline 230' with RW min/mod assist, 5' with hemiwalker CGA.    Time 8    Period Weeks    Status Revised    Target Date 02/28/20      PT LONG TERM GOAL #4   Title Pt will increase gait speed from 0.72m/s to >0.47m/s for improved gait safety.    Baseline 0.23m/s on 12/30/19    Time 8    Period Weeks    Status New    Target Date 02/28/20                 Plan - 02/12/20 1509    Clinical Impression Statement PT continued to focus more on trying to increase right weight shift. Trialed rockerboard for first time. Pt needss min assist to straighten right knee and control at pelvis to get board level. Although pt not as impulsive as she used to be she continues to get agitated quickly if caregivers try to participate at all in session. BP was a little lower at session today compared to last visit.    Personal Factors and  Comorbidities Comorbidity 3+    Comorbidities anxiety, migraines, HTN    Examination-Activity Limitations Bathing;Bed Mobility;Stand;Locomotion Level;Squat;Stairs;Dressing;Transfers    Examination-Participation Restrictions Community Activity;Driving;Shop;Laundry;Meal Prep    Stability/Clinical Decision Making Evolving/Moderate complexity    Rehab Potential Good    PT Frequency 2x / week    PT Duration 8 weeks    PT Treatment/Interventions ADLs/Self Care Home Management;Electrical Stimulation;DME Instruction;Gait training;Stair training;Functional mobility training;Therapeutic activities;Therapeutic exercise;Orthotic Fit/Training;Patient/family education;Neuromuscular re-education;Balance training;Manual techniques;Passive range of motion;Vestibular;Wheelchair mobility training    PT Next Visit Plan Did pt do any of the HEP over the weekend? May need to check BP; Seated physioball activities.  Walking with LUE reaching up and to the L pushing into tech/aide's/student's hand for more upright posture and R activation.  Continue with activities in standing to facilitate increased right weight shift and R hip flexion/initiation of R swing phase, continue gait with hemiwalker with givmohr sling use.  Pt responded well to short bout on SciFit.  Gait with her UE on your shoulders to get more upright posture, slow her  down and get more weight shift.    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 Nontraumatic subcortical hemorrhage of left cerebral hemisphere ([place]) 08/27/2019  . Acute blood loss anemia   . Hypoalbuminemia due to protein-calorie malnutrition ([place])   . Thrombocytopenia ([place])   . Dysphagia 07/29/2019  . Infarction of left basal ganglia ([place]) 07/18/2019  . Hypernatremia   . Leukocytosis   . Essential hypertension   . Global aphasia   . Cytotoxic brain edema ([place]) 07/10/2019  . ICH (intracerebral hemorrhage) ([place]) 07/09/2019  . Anxiety state 02/21/2015  . Cephalalgia 12/21/2014    Electa Sniff, PT, DPT, NCS 02/12/2020, 3:13 PM  [facility] [address] [place], [place], [address] Phone: [phone]   Fax:  [phone]  Name: Melanie Madden MRN: [id number] Date of Birth: Aug 10, 1965

## 2020-02-15 ENCOUNTER — Encounter: Payer: Managed Care, Other (non HMO) | Admitting: Occupational Therapy

## 2020-02-15 ENCOUNTER — Ambulatory Visit: Payer: Managed Care, Other (non HMO)

## 2020-02-15 ENCOUNTER — Encounter: Payer: Managed Care, Other (non HMO) | Admitting: Speech Pathology

## 2020-02-15 ENCOUNTER — Other Ambulatory Visit: Payer: Self-pay

## 2020-02-15 DIAGNOSIS — R2689 Other abnormalities of gait and mobility: Secondary | ICD-10-CM

## 2020-02-15 DIAGNOSIS — M6281 Muscle weakness (generalized): Secondary | ICD-10-CM

## 2020-02-15 DIAGNOSIS — I610 Nontraumatic intracerebral hemorrhage in hemisphere, subcortical: Secondary | ICD-10-CM | POA: Diagnosis not present

## 2020-02-15 DIAGNOSIS — I69153 Hemiplegia and hemiparesis following nontraumatic intracerebral hemorrhage affecting right non-dominant side: Secondary | ICD-10-CM

## 2020-02-15 NOTE — Therapy (Signed)
[facility] [address] [place], [place], [address] Phone: [phone]   Fax:  [phone]  Physical Therapy Treatment  Patient Details  Name: Melanie Madden MRN: [id number] Date of Birth: 01/01/1966 Referring Provider (PT): Lauraine Rinne, [place]   Encounter Date: 02/15/2020   PT End of Session - 02/15/20 1318    Visit Number 44    Number of Visits 54    Date for PT Re-Evaluation 03/29/20    Authorization Type [employer]- 60 VL combined PT/OT/ST - counts as 1 visit if seen on same day.  Not sure if 60 is hard max.  $50 copay each day - make sure PT/OT/ST on same day if possible    Authorization - Visit Number --   PT/OT/ST days combined   Authorization - Number of Visits 60    PT Start Time 1315    PT Stop Time 1400    PT Time Calculation (min) 45 min    Equipment Utilized During Treatment --   pt's R AFO; dance shoe cover to aid in foot clearance   Activity Tolerance Patient tolerated treatment well    Behavior During Therapy Impulsive;Agitated           Past Medical History:  Diagnosis Date  . Allergy   . Anxiety   . Arthritis   . High triglycerides   . Kidney stones   . Migraines   . Recurrent sinus infections     Past Surgical History:  Procedure Laterality Date  . ANTERIOR CRUCIATE LIGAMENT REPAIR Left   . BUNIONECTOMY Right   . LASIK    . LITHOTRIPSY    . MENISCUS REPAIR Left   . NASAL SEPTUM SURGERY    . SHOULDER SURGERY    . VARICOSE VEIN SURGERY Left     There were no vitals filed for this visit.   Subjective Assessment - 02/15/20 1318    Subjective Pt walked in with hemiwalker. Shakes head no when asked if she has been doing any of her exercises over the weekend.    Patient is accompained by: --   Caregivers   Pertinent History anxiety, migraines, HTN    Patient Stated Goals Per husband report they would like her to get stronger on right, improve mobility, and work on speech.     Currently in Pain? No/denies                             [facility] Adult PT Treatment/Exercise - 02/15/20 1319      Transfers   Transfers Sit to Stand;Stand to [employer]    Sit to Stand 5: Supervision    Stand to Sit 5: Supervision    Stand Pivot Transfers 5: Supervision      Ambulation/Gait   Ambulation/Gait Yes    Ambulation/Gait Assistance 5: Supervision;4: Min guard    Ambulation/Gait Assistance Details Tactile cues at left trunk and right hip for more upright posture. Pt able to correct when did not clear right foot through on own supervision/CGA for safety. BP=138/88 after gait    Ambulation Distance (Feet) 460 Feet    Assistive device Hemi-walker   right AFO   Gait Pattern Step-through pattern;Decreased hip/knee flexion - right;Poor foot clearance - right;Lateral trunk lean to left    Ambulation Surface Level;Indoor    Gait velocity 25.53 sec=0.43ms      Standardized Balance Assessment   Standardized Balance Assessment BMerrilee Jansky  Balance Test      Berg Balance Test   Sit to Stand Able to stand  independently using hands    Standing Unsupported Able to stand safely 2 minutes    Sitting with Back Unsupported but Feet Supported on Floor or Stool Able to sit safely and securely 2 minutes    Stand to Sit Sits safely with minimal use of hands    Transfers Able to transfer with verbal cueing and /or supervision    Standing Unsupported with Eyes Closed Able to stand 10 seconds with supervision    Standing Ubsupported with Feet Together Needs help to attain position and unable to hold for 15 seconds    From Standing, Reach Forward with Outstretched Arm Reaches forward but needs supervision    From Standing Position, Pick up Object from Floor Unable to try/needs assist to keep balance    From Standing Position, Turn to Look Behind Over each Shoulder Needs assist to keep from losing balance and falling    Turn 360 Degrees Needs assistance while turning     Standing Unsupported, Alternately Place Feet on Step/Stool Needs assistance to keep from falling or unable to try    Standing Unsupported, One Foot in Front Needs help to step but can hold 15 seconds    Standing on One Leg Unable to try or needs assist to prevent fall    Total Score 22      Neuro Re-ed    Neuro Re-ed Details  Standing weight shifting with mirror in front for visual cues to work on increasing left weight shift. PT holding pt's left hand in front to try to improve left trunk elongation, stepping forwards and back with less UE support on hemiwalker x 10 then with holding PT hand x 10. Pt had quicker step when decreased UE support. Standing placing clips on pole with LUE 6 x 2 with moving pole slightly to right to try to facilitate more weight shift. PT stabilizing at right knee for safety. Pt still putting most of weight on LLE.                   PT Education - 02/15/20 1731    Education Details PT discussed results of testing today. Discussed with pt and aide about plan to discharge next visit at end of cert. Discussed taking a break for awhile and pt could return in 6 months or so if changes noted to give her some time to see what she can do at home. Discussed importance of walking and encouraged going with her aide to a track and possibly trying recumbant stepper or bike at gym with aide.    Person(s) Educated Patient;Caregiver(s)    Methods Explanation    Comprehension Verbalized understanding            PT Short Term Goals - 02/03/20 1241      PT SHORT TERM GOAL #1   Title Pt will decrease 5 x sit to stand from 22 sec to <20 sec from mat for improved balance and functional strength.    Baseline 22 sec on 12/30/19, 02/03/20 5 x sit to stand=19.85 sec    Time 4    Period Weeks    Status Achieved    Target Date 01/29/20      PT SHORT TERM GOAL #2   Title Pt will ambulate with LRAD min assist >300' on level surfaces for improved household mobility with caregiver  assist.    Baseline 78' with  hemiwalker SBA/CGA    Time 4    Period Weeks    Status Achieved    Target Date 01/29/20             PT Long Term Goals - 02/15/20 1328      PT LONG TERM GOAL #1   Title Pt will be able to perform progressive HEP for strengthening, balance and mobility with assist of husband to continued gains at home.    Baseline PT continues to add to HEP    Time 8    Period Weeks    Status On-going      PT LONG TERM GOAL #2   Title Pt will increase Berg Balance from 20/56 to >26/ 56 for improved balance.    Baseline 20/56 on 12/30/19, 02/15/20 22/56    Time 8    Period Weeks    Status Not Met      PT LONG TERM GOAL #3   Title Pt will ambulate >400' CGA with LRAD  for improved gait ability in home.    Baseline 460' CGA with hemiwalker    Time 8    Period Weeks    Status Achieved      PT LONG TERM GOAL #4   Title Pt will increase gait speed from 0.89ms to >0.372m for improved gait safety.    Baseline 0.1131mon 12/30/19, 0.25m38mn 02/15/20    Time 8    Period Weeks    Status Achieved                 Plan - 02/15/20 1733    Clinical Impression Statement PT started checking LTGs. Pt met gait goal with use of hemiwalker at CGA level. Pt also increased gait speed to 0.25m/35mt is still ambulating at speed showing decreased safety with household ambulation. PT emphasized importance of assistance whenever up. Pt's Berg score of 22/56 indicated continue high fall risk.    Personal Factors and Comorbidities Comorbidity 3+    Comorbidities anxiety, migraines, HTN    Examination-Activity Limitations Bathing;Bed Mobility;Stand;Locomotion Level;Squat;Stairs;Dressing;Transfers    Examination-Participation Restrictions Community Activity;Driving;Shop;Laundry;Meal Prep    Stability/Clinical Decision Making Evolving/Moderate complexity    Rehab Potential Good    PT Frequency 2x / week    PT Duration 8 weeks    PT Treatment/Interventions ADLs/Self Care Home  Management;Electrical Stimulation;DME Instruction;Gait training;Stair training;Functional mobility training;Therapeutic activities;Therapeutic exercise;Orthotic Fit/Training;Patient/family education;Neuromuscular re-education;Balance training;Manual techniques;Passive range of motion;Vestibular;Wheelchair mobility training    PT Next Visit Plan Check to see if any issues/questions on HEP. Discharge next visit.    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 Nontraumatic subcortical hemorrhage of left cerebral hemisphere (HCC) [place]28/2021  . Acute blood loss anemia   . Hypoalbuminemia due to protein-calorie malnutrition (HCC) [place] Thrombocytopenia (HCC) [place] Dysphagia 07/29/2019  .  Infarction of left basal ganglia ([place]) 07/18/2019  . Hypernatremia   . Leukocytosis   . Essential hypertension   . Global aphasia   . Cytotoxic brain edema ([place]) 07/10/2019  . ICH (intracerebral hemorrhage) ([place]) 07/09/2019  . Anxiety state 02/21/2015  . Cephalalgia 12/21/2014    Electa Sniff, PT, DPT, NCS 02/15/2020, 5:37 PM  [facility] [address] [place], [place], [address] Phone:  [phone]   Fax:  [phone]  Name: Brielynn Sekula Da Glennie Madden MRN: [id number] Date of Birth: 12-29-1965

## 2020-02-18 ENCOUNTER — Encounter: Payer: Managed Care, Other (non HMO) | Admitting: Occupational Therapy

## 2020-02-18 ENCOUNTER — Ambulatory Visit: Payer: Managed Care, Other (non HMO)

## 2020-02-18 ENCOUNTER — Other Ambulatory Visit: Payer: Self-pay

## 2020-02-18 DIAGNOSIS — M6281 Muscle weakness (generalized): Secondary | ICD-10-CM

## 2020-02-18 DIAGNOSIS — R2689 Other abnormalities of gait and mobility: Secondary | ICD-10-CM

## 2020-02-18 DIAGNOSIS — I69153 Hemiplegia and hemiparesis following nontraumatic intracerebral hemorrhage affecting right non-dominant side: Secondary | ICD-10-CM

## 2020-02-18 DIAGNOSIS — I610 Nontraumatic intracerebral hemorrhage in hemisphere, subcortical: Secondary | ICD-10-CM | POA: Diagnosis not present

## 2020-02-18 NOTE — Therapy (Signed)
[facility] [address] [place], [place], [address] Phone: [phone]   Fax:  [phone]  Physical Therapy Treatment/ Discharge  Patient Details  Name: Melanie Madden MRN: [id number] Date of Birth: 08-14-1965 Referring Provider (PT): Mariam Dollar, PA PHYSICAL THERAPY DISCHARGE SUMMARY  Visits from Start of Care: 45  Current functional level related to goals / functional outcomes: See clinical impression for more information. Pt ambulating with hemiwalker CGA.    Remaining deficits: Right hemiparesis, balance deficits, apraxia    Education / Equipment: HEP  Plan: Patient agrees to discharge.  Patient goals were partially met. Patient is being discharged due to meeting the stated rehab goals.  ?????       Encounter Date: 02/18/2020   PT End of Session - 02/18/20 1454    Visit Number 45    Number of Visits 54    Date for PT Re-Evaluation 03/29/20    Authorization Type [employer]- 60 VL combined PT/OT/ST - counts as 1 visit if seen on same day.  Not sure if 60 is hard max.  $50 copay each day - make sure PT/OT/ST on same day if possible    Authorization - Visit Number --   PT/OT/ST days combined   Authorization - Number of Visits 60    PT Start Time 1450    PT Stop Time 1520   finished early as d/c session and need to call husband   PT Time Calculation (min) 30 min    Equipment Utilized During Treatment --   pt's R AFO; dance shoe cover to aid in foot clearance   Activity Tolerance Patient tolerated treatment well    Behavior During Therapy Impulsive;Agitated           Past Medical History:  Diagnosis Date  . Allergy   . Anxiety   . Arthritis   . High triglycerides   . Kidney stones   . Migraines   . Recurrent sinus infections     Past Surgical History:  Procedure Laterality Date  . ANTERIOR CRUCIATE LIGAMENT REPAIR Left   . BUNIONECTOMY Right   . LASIK    . LITHOTRIPSY    .  MENISCUS REPAIR Left   . NASAL SEPTUM SURGERY    . SHOULDER SURGERY    . VARICOSE VEIN SURGERY Left     There were no vitals filed for this visit.   Subjective Assessment - 02/18/20 1455    Subjective Pt walked in with hemiwalker. Shakes head no to if she is having any pain but then points out she has bandaide on right forearm. Shakes head no when asked if she is doing any exercises at home but yes to she has been walking at home. Aide reports that she has been walking daily and even went to the park. She also has been doing exercises since last session everyday and cooperating. Aide reports pt had a fall yesterday when they were walking at park. Went forward. Thinks she got her leg stuck on walker.    Patient is accompained by: --   Caregivers   Pertinent History anxiety, migraines, HTN    Patient Stated Goals Per husband report they would like her to get stronger on right, improve mobility, and work on speech.    Currently in Pain? Yes    Pain Score --   unable to rate   Pain Location Arm    Pain Orientation Right    Pain Descriptors / Indicators Sore  Pain Type Acute pain    Aggravating Factors  fall yesterday with skin tear to right forearm                             [facility] Adult PT Treatment/Exercise - 02/18/20 1520      Transfers   Transfers Sit to Stand;Stand to Sit    Sit to Stand 5: Supervision    Stand to Sit 5: Supervision      Ambulation/Gait   Ambulation/Gait Yes    Ambulation/Gait Assistance 5: Supervision;4: Min guard    Ambulation/Gait Assistance Details PT provided tactile assist to facilitate pelvic rotation with gait and to encourage right weight shift.     Ambulation Distance (Feet) 230 Feet    Assistive device Hemi-walker   right AFO, givmore sling   Gait Pattern Step-through pattern;Decreased step length - right;Decreased stance time - right;Decreased hip/knee flexion - right;Decreased weight shift to right;Lateral trunk lean to left     Ambulation Surface Level;Indoor      Therapeutic Activites    Therapeutic Activities Other Therapeutic Activities    Other Therapeutic Activities PT discussed with patient importance of working on exercises daily with aide or husband as well as walking with assistance at all times. PT recommending they use gait belt when out of home even though pt hates it for added safety to help prevent falls if pt loses balance again. Aide states she will make her when they go out.                  PT Education - 02/18/20 1848    Education Details Gait belt use when walking out of home for safety. Discharge as planned. Importance of continuing with exercises at home. PT also called pt's husband at end of session to answer any of his questions and be sure he was made aware of d/c. Discussed this being a good time to take a break and try to see if pt will work more at home with them due to plateau at current level. Let him know that pt can return in future if any change with new MD order.    Person(s) Educated Patient;Spouse;Caregiver(s)    Methods Explanation    Comprehension Verbalized understanding            PT Short Term Goals - 02/03/20 1241      PT SHORT TERM GOAL #1   Title Pt will decrease 5 x sit to stand from 22 sec to <20 sec from mat for improved balance and functional strength.    Baseline 22 sec on 12/30/19, 02/03/20 5 x sit to stand=19.85 sec    Time 4    Period Weeks    Status Achieved    Target Date 01/29/20      PT SHORT TERM GOAL #2   Title Pt will ambulate with LRAD min assist >300' on level surfaces for improved household mobility with caregiver assist.    Baseline 37' with hemiwalker SBA/CGA    Time 4    Period Weeks    Status Achieved    Target Date 01/29/20             PT Long Term Goals - 02/18/20 1851      PT LONG TERM GOAL #1   Title Pt will be able to perform progressive HEP for strengthening, balance and mobility with assist of husband to continued gains  at home.    Baseline  Pt and aid deny and questions on HEP.    Time 8    Period Weeks    Status Achieved      PT LONG TERM GOAL #2   Title Pt will increase Berg Balance from 20/56 to >26/ 56 for improved balance.    Baseline 20/56 on 12/30/19, 02/15/20 22/56    Time 8    Period Weeks    Status Not Met      PT LONG TERM GOAL #3   Title Pt will ambulate >400' CGA with LRAD  for improved gait ability in home.    Baseline 460' CGA with hemiwalker    Time 8    Period Weeks    Status Achieved      PT LONG TERM GOAL #4   Title Pt will increase gait speed from 0.57ms to >0.338m for improved gait safety.    Baseline 0.1165mon 12/30/19, 0.80m85mn 02/15/20    Time 8    Period Weeks    Status Achieved                 Plan - 02/18/20 1851    Clinical Impression Statement Pt has met all goals except no change on Berg Balance. Pt has not had change in assistance level with gait but has increased distance. Pt still needs CGA for safety and recommending this at all times. Pt ambulating with hemiwalker. Pt still high fall risk and has had recent fall. She has around the clock assistance. PT discharging at this time to see if pt can try to work more at home as has been resistant to participating with caregivers due to cognitive deficits which also limits carryover.    Personal Factors and Comorbidities Comorbidity 3+    Comorbidities anxiety, migraines, HTN    Examination-Activity Limitations Bathing;Bed Mobility;Stand;Locomotion Level;Squat;Stairs;Dressing;Transfers    Examination-Participation Restrictions Community Activity;Driving;Shop;Laundry;Meal Prep    Stability/Clinical Decision Making Evolving/Moderate complexity    Rehab Potential Good    PT Frequency 2x / week    PT Duration 8 weeks    PT Treatment/Interventions ADLs/Self Care Home Management;Electrical Stimulation;DME Instruction;Gait training;Stair training;Functional mobility training;Therapeutic activities;Therapeutic  exercise;Orthotic Fit/Training;Patient/family education;Neuromuscular re-education;Balance training;Manual techniques;Passive range of motion;Vestibular;Wheelchair mobility training    PT Next Visit Plan Discharge today    Consulted and Agree with Plan of Care Patient;Family member/caregiver    Family Member Consulted Caregiver           Patient will benefit from skilled therapeutic intervention in order to improve the following deficits and impairments:  Abnormal gait, Decreased activity tolerance, Decreased balance, Decreased cognition, Decreased knowledge of use of DME, Decreased mobility, Decreased range of motion, Decreased safety awareness, Decreased strength, Impaired sensation  Visit Diagnosis: Other abnormalities of gait and mobility  Muscle weakness (generalized)  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 Nontraumatic subcortical hemorrhage of left cerebral hemisphere (HCC)[place]/28/2021  . Acute blood loss anemia   . Hypoalbuminemia due to protein-calorie malnutrition (HCC)[place]. Thrombocytopenia (HCC)[place]. Dysphagia 07/29/2019  . Infarction of left basal ganglia (HCC)[place]/19/2020  . Hypernatremia   . Leukocytosis   . Essential hypertension   . Global aphasia   . Cytotoxic brain edema (HCC)[place]/05/2019  . ICH (intracerebral hemorrhage) (HCC)[place]/04/2019  . Anxiety state 02/21/2015  . Cephalalgia  12/21/2014    Electa Sniff, PT, DPT, NCS 02/18/2020, 6:55 PM  [facility] [address] [place], [place], [address] Phone: [phone]   Fax:  [phone]  Name: Melanie Madden MRN: [id number] Date of Birth: October 11, 1965

## 2020-02-22 ENCOUNTER — Encounter: Payer: Managed Care, Other (non HMO) | Admitting: Occupational Therapy

## 2020-02-22 ENCOUNTER — Encounter: Payer: Managed Care, Other (non HMO) | Admitting: Speech Pathology

## 2020-02-22 ENCOUNTER — Ambulatory Visit: Payer: Managed Care, Other (non HMO) | Admitting: Physical Therapy

## 2020-02-25 ENCOUNTER — Ambulatory Visit: Payer: Managed Care, Other (non HMO) | Admitting: Physical Therapy

## 2020-02-25 ENCOUNTER — Encounter: Payer: Managed Care, Other (non HMO) | Admitting: Occupational Therapy

## 2020-03-07 ENCOUNTER — Encounter: Payer: Managed Care, Other (non HMO) | Admitting: Psychology

## 2020-03-22 ENCOUNTER — Other Ambulatory Visit: Payer: Self-pay | Admitting: Physical Medicine & Rehabilitation

## 2020-03-24 ENCOUNTER — Ambulatory Visit: Payer: No Typology Code available for payment source | Admitting: Neurology

## 2020-05-05 ENCOUNTER — Other Ambulatory Visit: Payer: Self-pay

## 2020-05-05 ENCOUNTER — Encounter: Payer: Self-pay | Admitting: Neurology

## 2020-05-05 ENCOUNTER — Ambulatory Visit (INDEPENDENT_AMBULATORY_CARE_PROVIDER_SITE_OTHER): Payer: Self-pay | Admitting: Neurology

## 2020-05-05 DIAGNOSIS — R451 Restlessness and agitation: Secondary | ICD-10-CM
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
## 2020-05-06 ENCOUNTER — Encounter
Payer: Managed Care, Other (non HMO) | Attending: Physical Medicine & Rehabilitation | Admitting: Physical Medicine & Rehabilitation

## 2020-05-06 ENCOUNTER — Encounter: Payer: Self-pay | Admitting: Physical Medicine & Rehabilitation

## 2020-05-06 ENCOUNTER — Other Ambulatory Visit: Payer: Self-pay

## 2020-05-06 VITALS — BP 138/86 | HR 99 | Temp 98.4°F | Ht 67.0 in | Wt 138.6 lb

## 2020-05-06 DIAGNOSIS — I610 Nontraumatic intracerebral hemorrhage in hemisphere, subcortical: Secondary | ICD-10-CM

## 2020-05-06 DIAGNOSIS — R4701 Aphasia: Secondary | ICD-10-CM | POA: Diagnosis not present

## 2020-05-06 DIAGNOSIS — G8101 Flaccid hemiplegia affecting right dominant side: Secondary | ICD-10-CM

## 2020-05-06 MED ORDER — QUETIAPINE FUMARATE 50 MG PO TABS: 50.0000 mg | ORAL_TABLET | Freq: Two times a day (BID) | ORAL | 1 refills | Status: DC

## 2020-05-06 MED ORDER — GABAPENTIN 100 MG PO CAPS: 100.0000 mg | ORAL_CAPSULE | Freq: Three times a day (TID) | ORAL | 2 refills | Status: DC

## 2020-05-06 MED ORDER — SERTRALINE HCL 50 MG PO TABS
50.0000 mg | ORAL_TABLET | Freq: Every day | ORAL | 1 refills | Status: DC
Start: 2020-05-06 — End: 2020-07-12

## 2020-05-06 NOTE — Progress Notes (Signed)
Subjective:    Patient ID: Melanie Madden Melanie Madden, female    DOB: 06-Feb-1966, 54 y.o.   MRN: [id number] The patient is accompanied by her caregiver in the room and her husband is out in the lobby. 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   At last visit, 02/05/2019, the patient was using a hemiwalker to ambulate, but the patient is now using a small-based quad cane to ambulate. 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 Patient is excited about a new treadmill that her husband has ordered and drew a picture for me since she could not explain this verbally. The patient points to her right side but cannot express what she means.  We discussed that this was still very weak and may have some sensory deficits.  The patient denied pain on that side.  The patient reportedly dresses and bathes herself.  In addition the patient ambulates with the narrow-base quad cane independently using an AFO on the right foot.  Pain Inventory Average Pain 0 Pain Right Now 0 My pain is no pain  LOCATION OF PAIN  n/a  BOWEL Number of stools per week: normal Oral laxative use No  Type of laxative na Enema or suppository use No  History of colostomy No  Incontinent No   BLADDER Normal In and out cath, frequency na Able to self cath No  Bladder incontinence No  Frequent urination No  Leakage with coughing No  Difficulty starting stream No  Incomplete bladder emptying No    Mobility walk with assistance use a cane  Function I need assistance with the following:  dressing, bathing, toileting, meal prep, household duties and shopping  Neuro/Psych weakness depression anxiety  Prior Studies Any changes since last visit?  yes  Her husband reports she had a meltdown and refused to go into office lat week of MD she had appt. She did the same with Dr  Sima Matas. She likes to come to see Dr Letta Pate and he is asking that when possible, if Dr Sima Matas can come in while seeing Dr Letta Pate so that she can associate the two and ease her into the appts with Dr Sima Matas.    Physicians involved in your care Any changes since last visit?  no   Family History  Problem Relation Age of Onset  . Alzheimer's disease Mother   . Hypertension Mother   . Diabetes Mother   . Thyroid disease Mother   . Deep vein thrombosis Maternal Grandmother   . Colon cancer Neg Hx   . Colon polyps Neg Hx   . Esophageal cancer Neg Hx   . Pancreatic cancer Neg Hx   . Rectal cancer Neg Hx   . Stomach cancer Neg Hx   . Breast cancer Neg Hx    Social History   Socioeconomic History  . Marital status: Significant Other    Spouse name: Not on file  . Number of children: Not on file  . Years of education: Not on file  . Highest education level: Not on file  Occupational History  . Not on file  Tobacco Use  . Smoking status: Former Research scientist (life sciences)  . Smokeless tobacco: Never Used  Vaping Use  . Vaping Use: Never used  Substance and Sexual Activity  . Alcohol use: Yes    Alcohol/week: 0.0 standard drinks    Comment: social   . Drug use: No  .  Sexual activity: Yes    Partners: Male  Other Topics Concern  . Not on file  Social History Narrative  . Not on file   Social Determinants of Health   Financial Resource Strain:   . Difficulty of Paying Living Expenses: Not on file  Food Insecurity:   . Worried About Charity fundraiser in the Last Year: Not on file  . Ran Out of Food in the Last Year: Not on file  Transportation Needs:   . Lack of Transportation (Medical): Not on file  . Lack of Transportation (Non-Medical): Not on file  Physical Activity:   . Days of Exercise per Week: Not on file  . Minutes of Exercise per Session: Not on file  Stress:   . Feeling of Stress : Not on file  Social Connections:   . Frequency of Communication with Friends  and Family: Not on file  . Frequency of Social Gatherings with Friends and Family: Not on file  . Attends Religious Services: Not on file  . Active Member of Clubs or Organizations: Not on file  . Attends Archivist Meetings: Not on file  . Marital Status: Not on file   Past Surgical History:  Procedure Laterality Date  . ANTERIOR CRUCIATE LIGAMENT REPAIR Left   . BUNIONECTOMY Right   . LASIK    . LITHOTRIPSY    . MENISCUS REPAIR Left   . NASAL SEPTUM SURGERY    . SHOULDER SURGERY    . VARICOSE VEIN SURGERY Left    Past Medical History:  Diagnosis Date  . Allergy   . Anxiety   . Arthritis   . High triglycerides   . Kidney stones   . Migraines   . Recurrent sinus infections    BP 138/86   Pulse 99   Temp 98.4 F (36.9 C)   Ht 5\' 7"  (1.702 m)   Wt 138 lb 9.6 oz (62.9 kg)   SpO2 98%   BMI 21.71 kg/m   Opioid Risk Score:   Fall Risk Score:  `1  Depression screen PHQ 2/9  Depression screen [facility] 2/9 05/06/2020 02/05/2020 09/25/2019  Decreased Interest 0 3 0  Down, Depressed, Hopeless 0 3 1  PHQ - 2 Score 0 6 1  Altered sleeping - - 0  Tired, decreased energy - - 0  Change in appetite - - 0  Feeling bad or failure about yourself  - - 0  Trouble concentrating - - 0  Moving slowly or fidgety/restless - - 1  Suicidal thoughts - - 0  PHQ-9 Score - - 2  Difficult doing work/chores - - Somewhat difficult    Review of Systems  Constitutional: Negative.   HENT: Negative.   Eyes: Negative.   Respiratory: Negative.   Cardiovascular: Negative.   Gastrointestinal: Negative.   Endocrine: Negative.   Genitourinary: Negative.   Musculoskeletal: Positive for gait problem.  Skin: Negative.   Allergic/Immunologic: Negative.   Neurological: Positive for weakness.  Hematological: Negative.   Psychiatric/Behavioral: Positive for dysphoric mood. The patient is nervous/anxious.   All other systems reviewed and are negative.      Objective:   Physical  Exam Constitutional:      Appearance: She is normal weight.  HENT:     Head: Normocephalic and atraumatic.  Eyes:     Conjunctiva/sclera: Conjunctivae normal.     Pupils: Pupils are equal, round, and reactive to light.  Cardiovascular:     Rate and Rhythm: Normal rate and regular  rhythm.     Heart sounds: Normal heart sounds. No murmur heard.   Pulmonary:     Effort: Pulmonary effort is normal. No respiratory distress.     Breath sounds: Normal breath sounds.  Abdominal:     General: Abdomen is flat. There is no distension.     Palpations: There is no mass.  Neurological:     Mental Status: She is alert.     Motor: Weakness and abnormal muscle tone present.     Gait: Gait abnormal.     Comments: Globally aphasic Patient able to write her full name She is able to write the date and only required cues to change the month from 46, November to 10, October  0/5 strength in the right deltoid bicep tricep finger flexors and extensors 2 - right hip knee extensor synergy 0/5 at the ankle 2 - at the right hip adductor's and right hip abductor's Sensation cannot assess secondary to global aphasia  Ambulates with a small base quad cane right AFO she has a stiff leg gait on the right side with some hyperextension at the knee but no pain during ambulation.  Psychiatric:        Attention and Perception: Attention normal.        Mood and Affect: Affect is labile.        Speech: She is noncommunicative.     Comments: The patient attends well.  She is globally aphasic and has difficulty with even yes no verbal responses.  Receptive language is relatively more intact than expressive The patient becomes emotionally labile and cries at times           Assessment & Plan:  #1.  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  The patient has had some return in the right lower extremity but not in the right upper extremity.   Functionally she has regained modified independent level with ADLs but still requires supervision in the house due to safety issues. I spoke separately to the husband after the visit and he has concerns about patient cooperating with treatment.  She does take her medications.  She does not see her primary physician but sees the nurse practitioner with that practice.  According to the husband, patient becomes very angry when speaking about her primary physician. The patient also refused to go into see her neurologist and will have a phone visit next time  The patient has had a hiatus of approximately 3 months from PT and speech therapy.  She has made some gains in the interval time, notably she is now ambulating with a small base quad cane as well as having ability to write her name.  She is also using pictures to express herself.  Will order outpatient PT OT  I also have recommended at last visit to see Dr. Sima Matas however the patient has refused thus far.  We may be able to coordinate a visit on the same day as her MD visit to make an introduction.  Due to problems with intermittent agitation will go up on the Seroquel, other options would be a trial of Depakote or topiramate We will also initiate sertraline for depression.  May also help with emotional lability

## 2020-05-09 DIAGNOSIS — Z0289 Encounter for other administrative examinations: Secondary | ICD-10-CM

## 2020-06-01 ENCOUNTER — Ambulatory Visit: Payer: Managed Care, Other (non HMO) | Admitting: Neurology

## 2020-06-01 ENCOUNTER — Telehealth: Payer: Managed Care, Other (non HMO) | Admitting: Neurology

## 2020-06-02 ENCOUNTER — Telehealth: Payer: Self-pay | Admitting: *Deleted

## 2020-06-02 NOTE — Telephone Encounter (Signed)
Mr Joylene John called and is asking for a call from Dr Letta Pate. Slyvia has an appt tomorrow with Dr Letta Pate and he is hoping Dr Sima Matas will be in the office too. I called him back and let him know Dr Sima Matas is not in the office tomorrow.  I am going to cancel the appt for tomorrow and he would like Dr Letta Pate to consult with Dr Sima Matas and then they can schedule an appt with [facility] on a day when Dr Sima Matas will be in the office.

## 2020-06-03 ENCOUNTER — Encounter: Payer: Managed Care, Other (non HMO) | Admitting: Physical Medicine & Rehabilitation

## 2020-06-03 NOTE — Telephone Encounter (Signed)
Pt's husband is concernred about non compliance of pt with any follow up visits.  She apparently listens only to my recs and has left Neuro offices etc.  I'd like her to see you but husband feels the only way is if I can do a friendly hand off to you on that same day.  The hope is that will get the pt to stay in the office to talk to you.  Pt aphasic and agitated at times.

## 2020-07-12 ENCOUNTER — Other Ambulatory Visit: Payer: Self-pay | Admitting: *Deleted

## 2020-07-12 MED ORDER — QUETIAPINE FUMARATE 50 MG PO TABS: 50.0000 mg | ORAL_TABLET | Freq: Two times a day (BID) | ORAL | 1 refills | Status: DC

## 2020-07-12 MED ORDER — SERTRALINE HCL 50 MG PO TABS: 50.0000 mg | ORAL_TABLET | Freq: Every day | ORAL | 1 refills | Status: DC

## 2020-07-18 ENCOUNTER — Telehealth: Payer: Self-pay

## 2020-07-18 NOTE — Telephone Encounter (Signed)
Melanie Madden has requested a letter to be dismissed from [employer]. The letter must be submitted before 09/05/2020.   The letter must state reason for dismissal.  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    Call back phone number  Mr. Joylene John (Spouse) [phone].  Thank you.

## 2020-07-20 ENCOUNTER — Encounter: Payer: Self-pay | Admitting: Physical Medicine & Rehabilitation

## 2020-07-20 NOTE — Telephone Encounter (Signed)
Letter at front desk for pick-up by Mr. Joylene John.

## 2020-09-13 ENCOUNTER — Other Ambulatory Visit: Payer: Self-pay

## 2020-09-13 MED ORDER — SERTRALINE HCL 50 MG PO TABS: 50.0000 mg | ORAL_TABLET | Freq: Every day | ORAL | 1 refills | Status: DC

## 2020-09-13 MED ORDER — QUETIAPINE FUMARATE 50 MG PO TABS: 50.0000 mg | ORAL_TABLET | Freq: Two times a day (BID) | ORAL | 1 refills | Status: DC

## 2020-09-27 ENCOUNTER — Telehealth: Payer: Self-pay

## 2020-09-27 DIAGNOSIS — I610 Nontraumatic intracerebral hemorrhage in hemisphere, subcortical: Secondary | ICD-10-CM

## 2020-09-27 DIAGNOSIS — R4701 Aphasia: Secondary | ICD-10-CM

## 2020-09-27 NOTE — Telephone Encounter (Signed)
[employer] called requesting patient to get speech therapy again. Would like an order sent.

## 2020-09-27 NOTE — Telephone Encounter (Signed)
Please order OP speech at neuro rehab for aphasia

## 2020-09-28 NOTE — Telephone Encounter (Signed)
Order placed and Mr [place] notified.

## 2020-09-30 ENCOUNTER — Telehealth: Payer: Self-pay | Admitting: *Deleted

## 2020-09-30 NOTE — Telephone Encounter (Signed)
Please coordinate a time where both myself and Dr. Sima Matas can see the patient the same morning or afternoon

## 2020-09-30 NOTE — Telephone Encounter (Signed)
Mr Melanie Madden is requesting an appointment with Dr Letta Pate for Melanie Madden on a day that Dr Sima Matas will be in the office so that he can come in while patient is with Dr Letta Pate. She is very anxious and trusts Dr Letta Pate and would be more amenable to Dr Sima Matas if Dr Letta Pate introduces him.

## 2020-10-03 ENCOUNTER — Telehealth: Payer: Self-pay | Admitting: *Deleted

## 2020-10-07 NOTE — Telephone Encounter (Signed)
medcations requested should be refilled by primary care.

## 2020-10-19 ENCOUNTER — Ambulatory Visit: Payer: Managed Care, Other (non HMO) | Attending: Family Medicine

## 2020-10-19 ENCOUNTER — Ambulatory Visit: Payer: Managed Care, Other (non HMO)

## 2020-10-19 ENCOUNTER — Other Ambulatory Visit: Payer: Self-pay

## 2020-10-19 DIAGNOSIS — R41841 Cognitive communication deficit: Secondary | ICD-10-CM | POA: Insufficient documentation

## 2020-10-19 DIAGNOSIS — R482 Apraxia: Secondary | ICD-10-CM | POA: Insufficient documentation

## 2020-10-19 DIAGNOSIS — R4701 Aphasia: Secondary | ICD-10-CM | POA: Diagnosis present

## 2020-10-19 DIAGNOSIS — R293 Abnormal posture: Secondary | ICD-10-CM

## 2020-10-19 DIAGNOSIS — I69151 Hemiplegia and hemiparesis following nontraumatic intracerebral hemorrhage affecting right dominant side: Secondary | ICD-10-CM

## 2020-10-19 DIAGNOSIS — R2689 Other abnormalities of gait and mobility: Secondary | ICD-10-CM | POA: Diagnosis present

## 2020-10-19 DIAGNOSIS — M6281 Muscle weakness (generalized): Secondary | ICD-10-CM | POA: Diagnosis present

## 2020-10-19 NOTE — Therapy (Signed)
[facility] [address] [place], [place], [address] Phone: [phone]   Fax:  [phone]  Speech Language Pathology Evaluation  Patient Details  Name: Melanie Madden MRN: [id number] Date of Birth: 04/15/1966 Referring Provider (SLP): Alysia Penna   Encounter Date: 10/19/2020   End of Session - 10/19/20 1409    Visit Number 1    Number of Visits 17    Date for SLP Re-Evaluation 01/17/21    SLP Start Time 1322   Pt arrived 7 mins late   SLP Stop Time  62    SLP Time Calculation (min) 36 min    Activity Tolerance Patient tolerated treatment well           Past Medical History:  Diagnosis Date  . Allergy   . Anxiety   . Arthritis   . High triglycerides   . Kidney stones   . Migraines   . Recurrent sinus infections     Past Surgical History:  Procedure Laterality Date  . ANTERIOR CRUCIATE LIGAMENT REPAIR Left   . BUNIONECTOMY Right   . LASIK    . LITHOTRIPSY    . MENISCUS REPAIR Left   . NASAL SEPTUM SURGERY    . SHOULDER SURGERY    . VARICOSE VEIN SURGERY Left     There were no vitals filed for this visit.       SLP Evaluation [facility] - 10/19/20 1327      SLP Visit Information   SLP Received On 10/18/20    Referring Provider (SLP) Alysia Penna    Onset Date December 2020    Medical Diagnosis Left CVA- subcortical hemmorhage      Subjective   Subjective Pt unable to verbally provide any previous hx or detail re:  current functioning. Pt able to use gestures with much success and write with prompted.    Patient/Family Stated Goal Pt gestured towards her mouth to indicate possible goal of speaking      General Information   HPI Pt had nontraumatic subcortial hemmorhage of left cerebral hemisphere in December 2020. Pt received OPST for 6 months with minimal progress achieved secondary to severity of deficits and worsening behaviors. She has had a hiatus of  approximately 9 months from speech therapy. She has made some gains in the interval time, notably she has ability to write her name.  She is also using pictures to express herself.    Behavioral/Cognition Pt tolerated evaluation well, with rare tearful episodes    Mobility Status walked with cane - PT eval today      Balance Screen   Has the patient fallen in the past 6 months No      Prior Functional Status   Cognitive/Linguistic Baseline Within functional limits    Type of Home House     Lives With Spouse    Available Support Personal care attendant      Cognition   Overall Cognitive Status History of cognitive impairments - at baseline      Auditory Comprehension   Overall Auditory Comprehension Impaired    Yes/No Questions Impaired- Complex (0-25% accurate)    Conversation Simple    Interfering Components Motor planning;Processing speed;Working Building surveyor;Slowed speech;Stressing words;Visual/Gestural cues      Reading Comprehension   Reading Status Within funtional limits      Expression   Primary Mode of Expression Nonverbal - gestures      Verbal Expression  Overall Verbal Expression Impaired    Initiation Impaired    Level of Generative/Spontaneous Verbalization Word - apraxic errors   Repetition Impaired    Level of Impairment Word level    Naming Impairment    Responsive 0-25% accurate    Verbal Errors Aware of errors;Not aware of errors;Jargon;Neologisms;Phonemic paraphasias;Perseveration    Interfering Components Speech intelligibility;Other (comment)   apraxia   Effective Techniques Articulatory cues    Non-Verbal Means of Communication Gestures;Writing;Drawing      Written Expression   Written Expression Exceptions to [facility]    Overall Writen Expression spelling errors, only able to spell simple words      Oral Motor/Sensory Function   Overall Oral Motor/Sensory Function Impaired    Labial ROM Other  (Comment)   reduced bilateral   Labial Symmetry Abnormal symmetry right    Labial Coordination Reduced    Lingual ROM Within Functional Limits    Lingual Symmetry Within Functional Limits    Lingual Coordination Reduced    Facial Symmetry Right droop    Facial Strength Reduced    Facial Coordination Reduced      Motor Speech   Overall Motor Speech Impaired    Articulation Impaired    Motor Planning Impaired    Level of Impairment Word    Motor Speech Errors Unaware;Groping for words;Inconsistent      Standardized Assessments   Standardized Assessments  Other Assessment   [place] Evidence-Based Language Test   Other Assessment 48/67   Perceptual (15/15), Auditory Comprehension (21/24), Verbal Expression (3/19), Reading (4/4), and Writing (5/5)                          SLP Education - 10/19/20 1410    Education Details eval results, possible goals, compensations    Person(s) Educated Patient    Methods Explanation;Demonstration    Comprehension Verbalized understanding;Returned demonstration;Need further instruction            SLP Short Term Goals - 10/19/20 1426      SLP SHORT TERM GOAL #1   Title Given SLP modeling, pt will simultaneously produce 3 different phonemes for word approximations with 50% accuracy over three sessions    Time 4    Period Weeks   or 9 total visits   Status New      SLP SHORT TERM GOAL #2   Title Pt will ID personally relevant objects in f:4 for communication of wants/needs with 75% accuracy with min A over 3 sessions    Time 4    Period Weeks    Status New      SLP SHORT TERM GOAL #3   Title Pt will write word or word approximations to aid communication for 3/5 opportunities with min A over 3 sessions.    Time 4    Period Weeks    Status New      SLP SHORT TERM GOAL #4   Title Pt will use mulitmodal communication (gesture, draw, write 1st letter etc) to augment verbal expression with usual min A over 3 sessions    Time  4    Period Weeks    Status New      SLP SHORT TERM GOAL #5   Title Pt will verbalize salient functional phrases with use of MIT with usual min A over 3 sessions    Time 4    Period Weeks    Status New  SLP Long Term Goals - 10/19/20 1438      SLP LONG TERM GOAL #1   Title Pt will accurately produce phonemes to facilitate word approximations for 3 personally relevant words with min A over 2 sessions    Time 8    Period Weeks   or 17 total visits   Status New      SLP LONG TERM GOAL #2   Title Pt will ID personally relevant objects in f:8 for communication of wants/needs with 75% accuracy given rare min A over 3 sessions    Time 8    Period Weeks    Status New      SLP LONG TERM GOAL #3   Title Pt will use mulitmodal communication (gesture, draw, write 1st letter etc) to augment verbal expression to meet needs at home with rare min A from family.    Time 8    Period Weeks    Status New      SLP LONG TERM GOAL #4   Title Pt will verbalize salient functional phrases x3 with use of MIT with occasional min A over 3 sessions    Time [place] - 10/19/20 1420    Clinical Impression Statement Melanie Madden presents today for ST evaluation to assess current communication abilities secondary to global aphasia. In December 2020, pt had nontraumatic subcortial hemmorhage of left cerebrral hemisphere resulting in severe global aphasia and apraxia. Pt received OPST intervention from January 2021 to June 2021 with minimal progress made secondary to severity of deficits and impact of behaviors. Since end of ST services, pt has reportedly progressed, including using pictures to communicate and now has ability to write her own name. Today, pt presented with mild auditory comprehension and severe verbal expression deficits. Pt was able to compensate intermittently with independent use of gestures and cued writing. SLP recommends skilled ST  intervention to maximize current communication skills to increase communication effectiveness as well as target speech related tasks as pt desires to speak if possible.    Speech Therapy Frequency 2x / week    Duration 8 weeks   or 17 total visits   Treatment/Interventions Oral motor exercises;Compensatory strategies;Cueing hierarchy;Functional tasks;Patient/family education;Cognitive reorganization;Multimodal communcation approach;Language facilitation;Compensatory techniques;Internal/external aids;SLP instruction and feedback    Potential to Achieve Goals Fair    Potential Considerations Previous level of function;Severity of impairments    Consulted and Agree with Plan of Care Patient           Patient will benefit from skilled therapeutic intervention in order to improve the following deficits and impairments:   Verbal apraxia  Aphasia  Cognitive communication deficit    Problem List Patient Active Problem List   Diagnosis Date Noted  . 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 Nontraumatic subcortical hemorrhage of left cerebral hemisphere ([place]) 08/27/2019  . Acute blood loss anemia   . Hypoalbuminemia due to protein-calorie malnutrition ([place])   . Thrombocytopenia ([place])   . Dysphagia 07/29/2019  . Infarction of left basal ganglia ([place]) 07/18/2019  . Hypernatremia   . Leukocytosis   . Essential hypertension   . Global aphasia   . Cytotoxic brain edema ([place]) 07/10/2019  . ICH (intracerebral hemorrhage) ([place]) 07/09/2019  . Anxiety state 02/21/2015  . Cephalalgia 12/21/2014    Alinda Deem, MA CCC-SLP 10/19/2020, 2:55 PM  [facility] [address] [place], [place], [address] Phone: [phone]   Fax:  [phone]  Name: Melanie Madden MRN: [id number] Date of Birth: Sep 01, 1965

## 2020-10-19 NOTE — Therapy (Signed)
[facility] [address] [place], [place], [address] Phone: [phone]   Fax:  [phone]  Physical Therapy Evaluation  Patient Details  Name: Melanie Madden MRN: [id number] Date of Birth: February 10, 1966 Referring Provider (PT): Alysia Penna   Encounter Date: 10/19/2020   PT End of Session - 10/19/20 1401    Visit Number 1    Number of Visits 17    PT Start Time 1400    PT Stop Time 1447    PT Time Calculation (min) 47 min    Activity Tolerance Patient tolerated treatment well    Behavior During Therapy [facility] for tasks assessed/performed           Past Medical History:  Diagnosis Date   Allergy    Anxiety    Arthritis    High triglycerides    Kidney stones    Migraines    Recurrent sinus infections     Past Surgical History:  Procedure Laterality Date   ANTERIOR CRUCIATE LIGAMENT REPAIR Left    BUNIONECTOMY Right    LASIK     LITHOTRIPSY     MENISCUS REPAIR Left    NASAL SEPTUM SURGERY     SHOULDER SURGERY     VARICOSE VEIN SURGERY Left     There were no vitals filed for this visit.    Subjective Assessment - 10/19/20 1401    Subjective Pt returns to therapy after a break from last July. Now using a SBQC. Pt does not have anyone with her during evaluation. Aide in waiting room. Husband at work. Pt is nonverbal. Able to answer yes/no questions.    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    Currently in Pain? No/denies              [facility] PT Assessment - 10/19/20 1406      Assessment   Medical Diagnosis Nontraumatic subcortical hemorrhage of left cerebral hemisphere, Right flaccid hemiplegia    Referring Provider (PT) Alysia Penna    Onset Date/Surgical Date 05/06/20    Hand  Dominance Right      Precautions   Precautions [place]    Living Arrangements Spouse/significant other    Available Help at Discharge Family    Type of [place] entrance    [employer] Two level    Alternate Level Stairs-Number of Steps 15   stair Hunter - quad;Bedside commode;Tub bench;Hand held shower head;Wheelchair - manual;Walker - 2 wheels   hemiwalker   Additional Comments right anterior AFO      Prior Function   Level of Independence Independent   prior to CVA in 06/2019, currently able to perform basic ADLs supervision for safety.   Vocation On disability      Cognition   Overall Cognitive Status History of cognitive impairments - at baseline   global aphasia with expressive most impaired. Able to answer yes/no questions fairly consistently. Nonverbal. Followed single step and some multi step commands. Pt does seem to perseverate on things.     Sensation   Light Touch Impaired by gross assessment    Additional Comments impaired sensation left  side      ROM / Strength   AROM / PROM / Strength Strength      Strength   Overall Strength Comments RUE and RLE grossly 5/5, no active movement noted in RUE with flaccid hemiparesis    Strength Assessment Site Hip;Knee;Ankle    Right/Left Shoulder Right    Right/Left Hip Right    Right Hip Flexion 2-/5    Right/Left Knee Right    Right Knee Flexion 2-/5    Right Knee Extension 2-/5    Right/Left Ankle Right    Right Ankle Dorsiflexion 2-/5      Transfers   Transfers Sit to Stand;Stand to Sit    Sit to Stand 5: Supervision    Sit to Stand Details (indicate cue type and reason) weight shifted to left    Five time sit to stand comments  13.63 sec from chair with left arm    Stand to Sit 5: Supervision      Ambulation/Gait   Ambulation/Gait Yes    Ambulation/Gait Assistance 5: Supervision;4: Min guard    Ambulation/Gait  Assistance Details Pt with decreased right foot clearance with some circumduction. Limited right hip flexion and no right knee flexion noted. Slight lateral trunk lean to the left.    Ambulation Distance (Feet) 130 Feet    Assistive device Small based quad cane    Gait Pattern Step-to pattern;Step-through pattern;Decreased stance time - right;Decreased step length - right;Decreased step length - left;Decreased hip/knee flexion - right;Right circumduction;Decreased weight shift to right;Lateral trunk lean to left    Ambulation Surface Level;Indoor    Gait velocity 32.48 sec=0.22m/s      Standardized Balance Assessment   Standardized Balance Assessment Berg Balance Test      Berg Balance Test   Sit to Stand Able to stand  independently using hands    Standing Unsupported Able to stand safely 2 minutes    Sitting with Back Unsupported but Feet Supported on Floor or Stool Able to sit safely and securely 2 minutes    Stand to Sit Sits safely with minimal use of hands    Transfers Able to transfer safely, definite need of hands    Standing Unsupported with Eyes Closed Able to stand 10 seconds safely    Standing Unsupported with Feet Together Needs help to attain position but able to stand for 30 seconds with feet together    From Standing, Reach Forward with Outstretched Arm Can reach confidently >25 cm (10")    From Standing Position, Pick up Object from Floor Unable to pick up and needs supervision    From Standing Position, Turn to Look Behind Over each Shoulder Looks behind one side only/other side shows less weight shift    Turn 360 Degrees Needs assistance while turning    Standing Unsupported, Alternately Place Feet on Step/Stool Needs assistance to keep from falling or unable to try    Standing Unsupported, One Foot in [employer] balance while stepping or standing    Standing on One Leg Unable to try or needs assist to prevent fall    Total Score 31                       Objective measurements completed on examination: See above findings.               PT Education - 10/19/20 1857    Education Details Pt educated on PT plan of care.    Person(s) Educated  Patient    Methods Explanation    Comprehension Verbalized understanding            PT Short Term Goals - 10/19/20 1911      PT SHORT TERM GOAL #1   Title Pt will decrease 5 x sit to stand from 13.63 sec to <11 sec for improved balance and functional strength.    Baseline 10/19/20 13.63 sec from chair with hand    Time 4    Period Weeks    Status New    Target Date 11/18/20      PT SHORT TERM GOAL #2   Title Pt will ambulate >500' on level surfaces with [facility] supervision for improved household mobility.    Time 4    Period Weeks    Status New    Target Date 11/18/20             PT Long Term Goals - 10/19/20 1914      PT LONG TERM GOAL #1   Title Pt will be able to perform progressive HEP for strengthening, balance and mobility with assist of husband to continued gains at home.    Time 8    Period Weeks    Status New    Target Date 12/18/20      PT LONG TERM GOAL #2   Title Pt will increase Berg Balance from 31 to >34/56 for improved balance and decreased fall risk.    Baseline 10/19/20 31/56    Time 8    Period Weeks    Status New    Target Date 12/18/20      PT LONG TERM GOAL #3   Title Pt will ambulate > 600' on level outside paved surfaces and ramps with [facility] supervision for improved short community distances.    Time 8    Period Weeks    Status New    Target Date 12/18/20      PT LONG TERM GOAL #4   Title Pt will increase giat speed from 0.59m/s to >0.4 m/s for improved household mobility.    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 She has global aphasia and is nonverbal. Able to following simple commands and answer most yes/no questions. She has 2-/5 strength grossly in RLE and no active movement noted in RUE. Pt ambulates with SBQC close supervision/CGA. Gait speed of 0.30m/s indicates decreased safety with household ambulation. She is high fall risk based on 5 x sit to stand of 13.63 sec and Berg Balance score of 31/56. Pt will benefit from skilled PT to address strength, balance and functional mobility deficits.    Personal Factors and Comorbidities Time since onset of injury/illness/exacerbation;Comorbidity 3+;Behavior Pattern    Comorbidities anxiety, migraines, HTN    Examination-Activity Limitations Transfers;Stairs;Locomotion Level;Stand    Examination-Participation Restrictions Community Activity;Cleaning    Stability/Clinical Decision Making Evolving/Moderate complexity    Clinical Decision Making Moderate    Rehab Potential Good    PT Frequency 2x / week    PT Duration 8 weeks    PT Treatment/Interventions ADLs/Self Care Home Management;DME Instruction;Therapeutic activities;Functional mobility training;Stair training;Gait training;Therapeutic exercise;Balance training;Neuromuscular re-education;Patient/family education;Manual  techniques;Passive range of motion;Vestibular    PT Next Visit Plan Work on weight shifting to right in standing, functional strengthening, gait training with SBQC.    Consulted and Agree with Plan of Care Patient           Patient will benefit from skilled therapeutic intervention in order to improve the following deficits and impairments:  Abnormal gait,Decreased safety awareness,Decreased range of motion,Decreased mobility,Impaired sensation,Decreased strength,Decreased balance  Visit Diagnosis: Other abnormalities of gait and mobility  Muscle weakness (generalized)  Hemiplegia and hemiparesis following nontraumatic intracerebral hemorrhage  affecting right dominant side (HCC)  Abnormal posture     Problem List Patient Active Problem List   Diagnosis Date Noted   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 [place], [place], [address] Phone: [phone]   Fax:  [phone]  Name: Melanie Madden MRN: [id number] Date of Birth: [birth year]

## 2020-10-28 ENCOUNTER — Ambulatory Visit: Payer: Managed Care, Other (non HMO) | Attending: Family Medicine

## 2020-10-28 ENCOUNTER — Ambulatory Visit: Payer: Managed Care, Other (non HMO)

## 2020-10-28 ENCOUNTER — Other Ambulatory Visit: Payer: Self-pay

## 2020-10-28 VITALS — BP 158/88

## 2020-10-28 DIAGNOSIS — R482 Apraxia: Secondary | ICD-10-CM | POA: Insufficient documentation

## 2020-10-28 DIAGNOSIS — R293 Abnormal posture: Secondary | ICD-10-CM | POA: Insufficient documentation

## 2020-10-28 DIAGNOSIS — I69151 Hemiplegia and hemiparesis following nontraumatic intracerebral hemorrhage affecting right dominant side: Secondary | ICD-10-CM | POA: Insufficient documentation

## 2020-10-28 DIAGNOSIS — R4701 Aphasia: Secondary | ICD-10-CM | POA: Insufficient documentation

## 2020-10-28 DIAGNOSIS — M6281 Muscle weakness (generalized): Secondary | ICD-10-CM | POA: Diagnosis present

## 2020-10-28 DIAGNOSIS — R2689 Other abnormalities of gait and mobility: Secondary | ICD-10-CM | POA: Diagnosis not present

## 2020-10-28 DIAGNOSIS — R41841 Cognitive communication deficit: Secondary | ICD-10-CM | POA: Insufficient documentation

## 2020-10-28 NOTE — Patient Instructions (Signed)
LIP and TONGUE exercises  --- DO ALL EXERCISES TWICE EACH DAY  1. Lip press - Press your lips together firmly (like making a /m/ sound) and hold 5 seconds -repeat 10 times  2. LOUD and LONG [place] - make a kissing sound as loud as you can, as long as you can - repeat 10 times  3. Tongue sweep - Sweep your tongue in the pockets of your mouth - touch every tooth  - five revolutions each way  4. PUH! - 10 times       MAKE THE SOUNDS STRONG      TUH - 10 times      KUH! - 10 times  5. Move a licorice stick from side to side in your mouth - 10 back and forth movements  6. Say "OOOOO", and "EEEEE" 10 times (Kiss and smile)  7. Put air in your cheeks, and push on either side 10 times  *KEEP THE AIR IN and DON'T LET IT ESCAPE!!*

## 2020-10-28 NOTE — Therapy (Signed)
[facility] [address] [place], [place], [address] Phone: [phone]   Fax:  [phone]  Physical Therapy Treatment  Patient Details  Name: Melanie Madden MRN: [id number] Date of Birth: October 22, 1965 Referring Provider (PT): Alysia Penna   Encounter Date: 10/28/2020   PT End of Session - 10/28/20 1319    Visit Number 2    Number of Visits 17    PT Start Time 9381    PT Stop Time 1403    PT Time Calculation (min) 46 min    Activity Tolerance Patient tolerated treatment well    Behavior During Therapy [facility] for tasks assessed/performed           Past Medical History:  Diagnosis Date  . Allergy   . Anxiety   . Arthritis   . High triglycerides   . Kidney stones   . Migraines   . Recurrent sinus infections     Past Surgical History:  Procedure Laterality Date  . ANTERIOR CRUCIATE LIGAMENT REPAIR Left   . BUNIONECTOMY Right   . LASIK    . LITHOTRIPSY    . MENISCUS REPAIR Left   . NASAL SEPTUM SURGERY    . SHOULDER SURGERY    . VARICOSE VEIN SURGERY Left     Vitals:   10/28/20 1323  BP: (!) 158/88     Subjective Assessment - 10/28/20 1320    Subjective Pt denies any new issues. Denies any falls.    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    Currently in Pain? No/denies                             [facility] Adult PT Treatment/Exercise - 10/28/20 1320      Ambulation/Gait   Ambulation/Gait Yes    Ambulation/Gait Assistance 5: Supervision    Ambulation/Gait Assistance Details Pt ambulated to treadmill at beginning with noted decrease in rigt stance with circumducting. After performing BWS PT helped to facilitate right weight shift and instructed pt to increase left step length with slight  improvement in right stance time noted.    Ambulation Distance (Feet) 50 Feet   x 2   Assistive device Small based quad cane   right AFO   Gait Pattern Step-through pattern;Decreased arm swing - right;Decreased step length - right;Decreased step length - left;Decreased hip/knee flexion - right;Right circumduction    Ambulation Surface Level;Indoor    Gait Comments BWS over treadmill unweighted  33# at 0.30mph x 8 min with PT assisting to advance RLE for more hip/knee flexion to decrease circumduction. Pt instructed to try to ride RLE back more and increase left step length.  Then x 5 min more with PTA also assisting at pelvis to get more right weight shift and anterior pelvic rotation. PT used hand mitt to attach right hand to bars and kerlex around so would not slide during gait. BP=148/90 after.                    PT Short Term Goals - 10/19/20 1911      PT SHORT TERM GOAL #1   Title Pt will decrease 5 x sit to stand from 13.63 sec to <11 sec for  improved balance and functional strength.    Baseline 10/19/20 13.63 sec from chair with hand    Time 4    Period Weeks    Status New    Target Date 11/18/20      PT SHORT TERM GOAL #2   Title Pt will ambulate >500' on level surfaces with [facility] supervision for improved household mobility.    Time 4    Period Weeks    Status New    Target Date 11/18/20             PT Long Term Goals - 10/19/20 1914      PT LONG TERM GOAL #1   Title Pt will be able to perform progressive HEP for strengthening, balance and mobility with assist of husband to continued gains at home.    Time 8    Period Weeks    Status New    Target Date 12/18/20      PT LONG TERM GOAL #2   Title Pt will increase Berg Balance from 31 to >34/56 for improved balance and decreased fall risk.    Baseline 10/19/20 31/56    Time 8    Period Weeks    Status New    Target Date 12/18/20      PT LONG TERM GOAL #3   Title Pt will ambulate > 600' on level outside  paved surfaces and ramps with [facility] supervision for improved short community distances.    Time 8    Period Weeks    Status New    Target Date 12/18/20      PT LONG TERM GOAL #4   Title Pt will increase giat speed from 0.62m/s to >0.4 m/s for improved household mobility.    Baseline 10/19/20 0.51m/s    Time 8    Period Weeks    Status New    Target Date 12/18/20                 Plan - 10/28/20 1614    Clinical Impression Statement PT trialed BWS over treadmill with pt today. She responded well with some noted improvement in right stance time after.    Personal Factors and Comorbidities Time since onset of injury/illness/exacerbation;Comorbidity 3+;Behavior Pattern    Comorbidities anxiety, migraines, HTN    Examination-Activity Limitations Transfers;Stairs;Locomotion Level;Stand    Examination-Participation Restrictions Community Activity;Cleaning    Stability/Clinical Decision Making Evolving/Moderate complexity    Rehab Potential Good    PT Frequency 2x / week    PT Duration 8 weeks    PT Treatment/Interventions ADLs/Self Care Home Management;DME Instruction;Therapeutic activities;Functional mobility training;Stair training;Gait training;Therapeutic exercise;Balance training;Neuromuscular re-education;Patient/family education;Manual techniques;Passive range of motion;Vestibular    PT Next Visit Plan BWS over treadmill with ~30# unweighted. Performed at 0.82mph at this visit. Work on weight shifting to right in standing, functional strengthening, gait training with SBQC.    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 Nontraumatic subcortical hemorrhage of  left cerebral hemisphere ([place]) 08/27/2019  . Acute blood loss anemia   . Hypoalbuminemia due to protein-calorie malnutrition ([place])   . Thrombocytopenia ([place])   . Dysphagia 07/29/2019  . Infarction of left basal ganglia ([place]) 07/18/2019  . Hypernatremia   . Leukocytosis   . Essential hypertension   . Global aphasia   . Cytotoxic brain edema ([place]) 07/10/2019  . ICH (intracerebral hemorrhage) ([place]) 07/09/2019  . Anxiety state 02/21/2015  . Cephalalgia 12/21/2014    Electa Sniff, PT, DPT, NCS 10/28/2020, 4:17 PM  [facility] [address] [place], [place], [address] Phone: [phone]   Fax:  [phone]  Name: Melanie Madden MRN: [id number] Date of Birth: Aug 06, 1965

## 2020-10-28 NOTE — Therapy (Signed)
[facility] [address] [place], [place], [address] Phone: [phone]   Fax:  [phone]  Speech Language Pathology Treatment  Patient Details  Name: Melanie Madden MRN: [id number] Date of Birth: 01-Nov-1965 Referring Provider (SLP): Alysia Penna   Encounter Date: 10/28/2020   End of Session - 10/28/20 1335    Visit Number 2    Number of Visits 17    Date for SLP Re-Evaluation 01/17/21    Authorization Time Period 60 days combined    SLP Start Time 1233    SLP Stop Time  1318    SLP Time Calculation (min) 45 min    Activity Tolerance Patient tolerated treatment well;Other (comment)   tearful          Past Medical History:  Diagnosis Date  . Allergy   . Anxiety   . Arthritis   . High triglycerides   . Kidney stones   . Migraines   . Recurrent sinus infections     Past Surgical History:  Procedure Laterality Date  . ANTERIOR CRUCIATE LIGAMENT REPAIR Left   . BUNIONECTOMY Right   . LASIK    . LITHOTRIPSY    . MENISCUS REPAIR Left   . NASAL SEPTUM SURGERY    . SHOULDER SURGERY    . VARICOSE VEIN SURGERY Left     There were no vitals filed for this visit.   Subjective Assessment - 10/28/20 1402    Subjective nodded head    Currently in Pain? No/denies                 ADULT SLP TREATMENT - 10/28/20 1335      General Information   Behavior/Cognition Alert;Cooperative;Pleasant mood   Tearful     Cognitive-Linquistic Treatment   Treatment focused on Aphasia;Apraxia;Patient/family/caregiver education    Skilled Treatment Pt brought Lingraphica device this session, in which pt demonstrated how she uses alphabet phonemic sounds with videos. Pt able accurately voiced 11/26 sounds given video modeling. Pt able to self-correct additional sounds x4 given max verbal and visual SLP modeling. Pt exhibited difficulty distinguishing sounds including voiced versus voiceless as well as  correct placement (bilateral, labiodental, interdental, alveolar, velar, glottal). SLP introduced lingual and labial exercises for placement, with occasional tactile and frequent visual cues provided to improve placement. Pt able to approximate with ~50% acccuracy for simple words. Pt able to successfully verbalize "dog, water, pot" this session given SLP modeling and cues. SLP trialed alphabet board given significant anomic episode (pt asking for "soda" meaning water), in which pt able to ID correct letter to cue SLP. Pt was participatory and engaged this session; however, intermittent tearfulness exhibited requiring SLP counseling and positive reinforcement. Pt showed pictures of herself prior to CVA, which increased tearfulness.      Assessment / Recommendations / Plan   Plan Continue with current plan of care      Progression Toward Goals   Progression toward goals Progressing toward goals            SLP Education - 10/28/20 1338    Education Details alphabet board, lingual/labial placement, counseling    Person(s) Educated Patient    Methods Explanation;Demonstration;Handout    Comprehension Verbalized understanding;Returned demonstration;Need further instruction            SLP Short Term Goals - 10/28/20 1336      SLP SHORT TERM GOAL #1   Title Given SLP modeling, pt will simultaneously produce 3 different  phonemes for word approximations with 50% accuracy over three sessions    Baseline 10-28-20    Time 4    Period Weeks   or 9 total visits   Status On-going      SLP SHORT TERM GOAL #2   Title Pt will ID personally relevant objects in f:4 for communication of wants/needs with 75% accuracy with min A over 3 sessions    Time 4    Period Weeks    Status On-going      SLP SHORT TERM GOAL #3   Title Pt will write word or word approximations to aid communication for 3/5 opportunities with min A over 3 sessions.    Time 4    Period Weeks    Status On-going      SLP SHORT TERM  GOAL #4   Title Pt will use mulitmodal communication (gesture, draw, write 1st letter etc) to augment verbal expression with usual min A over 3 sessions    Baseline 10-28-20 (alphabet board)    Time 4    Period Weeks    Status On-going      SLP SHORT TERM GOAL #5   Title Pt will verbalize salient functional phrases with use of MIT with usual min A over 3 sessions    Time 4    Period Weeks    Status On-going            SLP Long Term Goals - 10/28/20 1337      SLP LONG TERM GOAL #1   Title Pt will accurately produce phonemes to facilitate word approximations for 3 personally relevant words with min A over 2 sessions    Time 8    Period Weeks   or 17 total visits   Status On-going      SLP LONG TERM GOAL #2   Title Pt will ID personally relevant objects in f:8 for communication of wants/needs with 75% accuracy given rare min A over 3 sessions    Time 8    Period Weeks    Status On-going      SLP LONG TERM GOAL #3   Title Pt will use mulitmodal communication (gesture, draw, write 1st letter etc) to augment verbal expression to meet needs at home with rare min A from family.    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 Pt provided Lingraphica device with demo of alphabet phonemic sounds with video modeling. Pt able to demo 42% of sounds with use of video model, with accuracy improving to 58% with max SLP modeling. SLP provided HEP to target lingual and labial exercises due to oral motor apraxia. SLP provided alphabet board to use as communication aid, with good success noted. SLP recommends skilled ST intervention to maximize current communication  skills to increase communication effectiveness as well as target speech related tasks as pt desires to speak if possible.    Speech Therapy Frequency 2x / week    Duration 8 weeks   or 17 total  visits   Treatment/Interventions Oral motor exercises;Compensatory strategies;Cueing hierarchy;Functional tasks;Patient/family education;Cognitive reorganization;Multimodal communcation approach;Language facilitation;Compensatory techniques;Internal/external aids;SLP instruction and feedback    Potential to Achieve Goals Fair    Potential Considerations Previous level of function;Severity of impairments    SLP Home Exercise Plan provided    Consulted and Agree with Plan of Care Patient           Patient will benefit from skilled therapeutic intervention in order to improve the following deficits and impairments:   Verbal apraxia  Aphasia    Problem List Patient Active Problem List   Diagnosis Date Noted  . 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 Nontraumatic subcortical hemorrhage of left cerebral hemisphere ([place]) 08/27/2019  . Acute blood loss anemia   . Hypoalbuminemia due to protein-calorie malnutrition ([place])   . Thrombocytopenia ([place])   . Dysphagia 07/29/2019  . Infarction of left basal ganglia ([place]) 07/18/2019  . Hypernatremia   . Leukocytosis   . Essential hypertension   . Global aphasia   . Cytotoxic brain edema ([place]) 07/10/2019  . ICH (intracerebral hemorrhage) ([place]) 07/09/2019  . Anxiety state 02/21/2015  . Cephalalgia 12/21/2014    Alinda Deem, MA CCC-SLP 10/28/2020, 3:18 PM  [facility] [address] [place], [place], [address] Phone: [phone]   Fax:  [phone]   Name: Melanie Madden MRN: [id number] Date of Birth: 1966/04/18

## 2020-11-04 ENCOUNTER — Other Ambulatory Visit: Payer: Self-pay

## 2020-11-04 ENCOUNTER — Ambulatory Visit: Payer: Managed Care, Other (non HMO)

## 2020-11-04 DIAGNOSIS — R4701 Aphasia: Secondary | ICD-10-CM

## 2020-11-04 DIAGNOSIS — M6281 Muscle weakness (generalized): Secondary | ICD-10-CM

## 2020-11-04 DIAGNOSIS — R2689 Other abnormalities of gait and mobility: Secondary | ICD-10-CM | POA: Diagnosis not present

## 2020-11-04 DIAGNOSIS — R482 Apraxia: Secondary | ICD-10-CM

## 2020-11-04 DIAGNOSIS — I69151 Hemiplegia and hemiparesis following nontraumatic intracerebral hemorrhage affecting right dominant side: Secondary | ICD-10-CM

## 2020-11-04 NOTE — Patient Instructions (Signed)
  Remember: these exercises are challenging given your oral apraxia. You can do them 1-2x day. If you get frustrated, take a break and try later.   Practice in the mirror (ex: "F" and "V" sound is made with your top teeth on your bottom lip). Get the placement right first then add sound.  Practice moving your tongue while looking in the mirror: (ex: put it in between your teeth, behind your top teeth, pull it back towards your throat).   Practice buzzing sounds "z" and "v". You should feel a vibration on your face/lips

## 2020-11-04 NOTE — Therapy (Signed)
[facility] [address] [place], [place], [address] Phone: [phone]   Fax:  [phone]  Speech Language Pathology Treatment  Patient Details  Name: Melanie Madden MRN: [id number] Date of Birth: Mar 21, 1966 Referring Provider (SLP): Alysia Penna   Encounter Date: 11/04/2020   End of Session - 11/04/20 1223    Visit Number 3    Number of Visits 17    Date for SLP Re-Evaluation 01/17/21    Authorization Time Period 60 days combined    SLP Start Time 30    SLP Stop Time  1315    SLP Time Calculation (min) 45 min    Activity Tolerance Patient tolerated treatment well           Past Medical History:  Diagnosis Date  . Allergy   . Anxiety   . Arthritis   . High triglycerides   . Kidney stones   . Migraines   . Recurrent sinus infections     Past Surgical History:  Procedure Laterality Date  . ANTERIOR CRUCIATE LIGAMENT REPAIR Left   . BUNIONECTOMY Right   . LASIK    . LITHOTRIPSY    . MENISCUS REPAIR Left   . NASAL SEPTUM SURGERY    . SHOULDER SURGERY    . VARICOSE VEIN SURGERY Left     There were no vitals filed for this visit.   Subjective Assessment - 11/04/20 1227    Subjective nodded head    Currently in Pain? No/denies                 ADULT SLP TREATMENT - 11/04/20 1225      General Information   Behavior/Cognition Alert;Cooperative;Pleasant mood;Impulsive   tearful     Cognitive-Linquistic Treatment   Treatment focused on Aphasia;Apraxia;Patient/family/caregiver education    Skilled Treatment Pt provided HEP from last session, with some frustration indicated re: completing BID. SLP educated patient on completing 1-2x day and to monitor frustration level. Pt highlighted /f/ and /v/ on alphabet order indicating she could not formulate sounds. SLP provided max fading to mod verbal and visual cues with mirror to correct placement of sounds, in which pt able to  demo with ~40% accuracy given extensive trials. Pt noted with intermittent voiced/voiceless errors (/t,d/), in which pt only aware of errors occasionally. Pt rarely able to self-correct artic error without SLP A. Pt occasionally tearful and frustrated while targeting verbal expression, requiring usual SLP encouragement. Pt able to approximate common words provided by husband, with pt able to generate first sound and approximate words with mod to max A with ~60% accuracy. SLP targeted naming common objects via 9 items general AAC board, in which pt was 89% accurate. SLP trialed beginning phase of melodic intonation, in which pt able to hum and demo verbalization of "my name" with SLP modeling.      Assessment / Recommendations / Plan   Plan Continue with current plan of care      Progression Toward Goals   Progression toward goals Progressing toward goals            SLP Education - 11/04/20 1354    Education Details alphabet board as cue, labial/lingual placement, use of visual feedback, current expectations    Person(s) Educated Patient    Methods Explanation;Demonstration;Handout    Comprehension Verbalized understanding;Returned demonstration;Need further instruction            SLP Short Term Goals - 11/04/20 1223  SLP SHORT TERM GOAL #1   Title Given SLP modeling, pt will simultaneously produce 3 different phonemes for word approximations with 50% accuracy over three sessions    Baseline 10-28-20, 11-04-20    Time 3    Period Weeks   or 9 total visits   Status On-going      SLP SHORT TERM GOAL #2   Title Pt will ID personally relevant objects in f:4 for communication of wants/needs with 75% accuracy with min A over 3 sessions    Baseline 11-04-20    Time 3    Period Weeks    Status On-going      SLP SHORT TERM GOAL #3   Title Pt will write word or word approximations to aid communication for 3/5 opportunities with min A over 3 sessions.    Time 3    Period Weeks    Status  On-going      SLP SHORT TERM GOAL #4   Title Pt will use mulitmodal communication (gesture, draw, write 1st letter etc) to augment verbal expression with usual min A over 3 sessions    Baseline 10-28-20 (alphabet board), 11-04-20 (draw)    Time 3    Period Weeks    Status On-going      SLP SHORT TERM GOAL #5   Title Pt will verbalize salient functional phrases with use of MIT with usual min A over 3 sessions    Time 3    Period Weeks    Status On-going            SLP Long Term Goals - 11/04/20 1224      SLP LONG TERM GOAL #1   Title Pt will accurately produce phonemes to facilitate word approximations for 3 personally relevant words with min A over 2 sessions    Time 7    Period Weeks   or 17 total visits   Status On-going      SLP LONG TERM GOAL #2   Title Pt will ID personally relevant objects in f:8 for communication of wants/needs with 75% accuracy given rare min A over 3 sessions    Time 7    Period Weeks    Status On-going      SLP LONG TERM GOAL #3   Title Pt will use mulitmodal communication (gesture, draw, write 1st letter etc) to augment verbal expression to meet needs at home with rare min A from family.    Time 7    Period Weeks    Status On-going      SLP LONG TERM GOAL #4   Title Pt will verbalize salient functional phrases x3 with use of MIT with occasional min A over 3 sessions    Time 7    Period Weeks    Status On-going            Plan - 11/04/20 1355    Clinical Impression Statement Melanie Madden presents today for ST intervention for severe global aphasia and apraxia. SLP targeted articulation placement of errored sounds via mirror, which was occasionally effective. SLP trialed introduction of MIT, which appears to be a potential intervention for this patient. Pt may benefit from communication board with pictures to reduce frustration and increase communication effectiveness, although pt appears fixated on ability to speak versus use compensations. SLP  recommends skilled ST intervention to maximize current communication skills to increase communication effectiveness as well as target speech related tasks as pt desires to speak if possible.    Speech  Therapy Frequency 2x / week    Duration 8 weeks   or 17 total visits   Treatment/Interventions Oral motor exercises;Compensatory strategies;Cueing hierarchy;Functional tasks;Patient/family education;Cognitive reorganization;Multimodal communcation approach;Language facilitation;Compensatory techniques;Internal/external aids;SLP instruction and feedback    Potential to Achieve Goals Fair    Potential Considerations Previous level of function;Severity of impairments    SLP Home Exercise Plan provided    Consulted and Agree with Plan of Care Patient           Patient will benefit from skilled therapeutic intervention in order to improve the following deficits and impairments:   Verbal apraxia  Aphasia    Problem List Patient Active Problem List   Diagnosis Date Noted  . 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 Nontraumatic subcortical hemorrhage of left cerebral hemisphere ([place]) 08/27/2019  . Acute blood loss anemia   . Hypoalbuminemia due to protein-calorie malnutrition ([place])   . Thrombocytopenia ([place])   . Dysphagia 07/29/2019  . Infarction of left basal ganglia ([place]) 07/18/2019  . Hypernatremia   . Leukocytosis   . Essential hypertension   . Global aphasia   . Cytotoxic brain edema ([place]) 07/10/2019  . ICH (intracerebral hemorrhage) ([place]) 07/09/2019  . Anxiety state 02/21/2015  . Cephalalgia 12/21/2014    Alinda Deem, MA CCC-SLP 11/04/2020, 1:59 PM  [facility] [address] [place], [place], [address] Phone: [phone]   Fax:  [phone]   Name: Melanie Madden MRN: [id number] Date of Birth: 1966-05-07

## 2020-11-04 NOTE — Therapy (Signed)
[facility] [address] [place], [place], [address] Phone: [phone]   Fax:  [phone]  Physical Therapy Treatment  Patient Details  Name: Melanie Madden Da Glennie Hawk MRN: [id number] Date of Birth: 04/21/1966 Referring Provider (PT): Alysia Penna   Encounter Date: 11/04/2020   PT End of Session - 11/04/20 1322    Visit Number 3    Number of Visits 17    PT Start Time 1318    PT Stop Time 1400    PT Time Calculation (min) 42 min    Activity Tolerance Patient tolerated treatment well    Behavior During Therapy [facility] for tasks assessed/performed           Past Medical History:  Diagnosis Date  . Allergy   . Anxiety   . Arthritis   . High triglycerides   . Kidney stones   . Migraines   . Recurrent sinus infections     Past Surgical History:  Procedure Laterality Date  . ANTERIOR CRUCIATE LIGAMENT REPAIR Left   . BUNIONECTOMY Right   . LASIK    . LITHOTRIPSY    . MENISCUS REPAIR Left   . NASAL SEPTUM SURGERY    . SHOULDER SURGERY    . VARICOSE VEIN SURGERY Left     There were no vitals filed for this visit.   Subjective Assessment - 11/04/20 1322    Subjective Pt denies any pain. PT looked into bruise per caregiver request to try to figure out how pt got it. PT did observe bruise on left upper arm that she claims just bumped and does not hurt. She shook head "no" to if anyone grabbed her arm. She did not wish to focus on it.    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    Currently in Pain? No/denies                             [facility] Adult PT Treatment/Exercise - 11/04/20 1323      Ambulation/Gait   Ambulation/Gait Yes    Ambulation/Gait Assistance 5: Supervision    Ambulation/Gait Assistance  Details to/from treadmill and in/out of clinic with [facility]. Verbal cues to try to bring right pelvis forward more and tactile cues to facilitate when walked after treadmill.    Ambulation Distance (Feet) 30 Feet   x 2 and 50' x1   Assistive device Small based quad cane   right AFO   Gait Pattern Step-through pattern;Decreased step length - right;Decreased step length - left;Decreased stance time - right;Decreased hip/knee flexion - right    Ambulation Surface Level;Indoor    Gait Comments BWS over treadmill x 10 min at 0.16mph with 30# unweighted and right hand strapped to bar with hand grip to support wrist some and kerlex around that to prevent slipping. PT provided mod assist at RLE to facilitate more right hip/knee flexion to decrease circumduction. Also provided tactile cues a few times at right pelvis to try to rotate forward more as tends to stay more posterior. Pt cued to stand up tall and try to look up as well as try to bring right pelvis forward more. Pt was cued to try to ride right leg back longer and increase  left step to increase right stance time.      Neuro Re-ed    Neuro Re-ed Details  Standing in front of mat at [facility] with PT provided facilitation at pelvis for right rotation forward having pt step forward and back with RLE x 10. Then just standing feet shoulder width apart having pt try to rotate right hip forward with PT provided tactile cues to facilitate x 10. Pt did tend to pull left pelvis back to try to perform motion. Sitting on green dynadisc: working on ant/post pelvic tits x 10 the lateral pelvic tilts x 10 with hands in lap and PT stabilizing at legs. Pt was cued to sit up tall throughout. Then sit to stands 5 x 2 from green dynadisc with hands in lap with PT heping to facilitate more right weight shift and verbal cues to lean forward more to rise as well as control descent.                  PT Education - 11/04/20 1524    Education Details Pt was instructed to try to  work sitting at home on pillow with ant/post and lateral pelvic tilts sitting up tall.    Person(s) Educated Patient    Methods Explanation;Demonstration    Comprehension Returned demonstration            PT Short Term Goals - 10/19/20 1911      PT SHORT TERM GOAL #1   Title Pt will decrease 5 x sit to stand from 13.63 sec to <11 sec for improved balance and functional strength.    Baseline 10/19/20 13.63 sec from chair with hand    Time 4    Period Weeks    Status New    Target Date 11/18/20      PT SHORT TERM GOAL #2   Title Pt will ambulate >500' on level surfaces with [facility] supervision for improved household mobility.    Time 4    Period Weeks    Status New    Target Date 11/18/20             PT Long Term Goals - 10/19/20 1914      PT LONG TERM GOAL #1   Title Pt will be able to perform progressive HEP for strengthening, balance and mobility with assist of husband to continued gains at home.    Time 8    Period Weeks    Status New    Target Date 12/18/20      PT LONG TERM GOAL #2   Title Pt will increase Berg Balance from 31 to >34/56 for improved balance and decreased fall risk.    Baseline 10/19/20 31/56    Time 8    Period Weeks    Status New    Target Date 12/18/20      PT LONG TERM GOAL #3   Title Pt will ambulate > 600' on level outside paved surfaces and ramps with [facility] supervision for improved short community distances.    Time 8    Period Weeks    Status New    Target Date 12/18/20      PT LONG TERM GOAL #4   Title Pt will increase giat speed from 0.2m/s to >0.4 m/s for improved household mobility.    Baseline 10/19/20 0.21m/s    Time 8    Period Weeks    Status New    Target Date 12/18/20  Plan - 11/04/20 1525    Clinical Impression Statement Pt was able to exhibit some improvement in right pelvis rotation on treadmill today. She still tends to get off RLE quick decreasing stance time with quick left step. Focused more  on pelvic rotation and pelvic movements to try to initiate some hip flexion standing and sitting. Pt had better control with activities today.    Personal Factors and Comorbidities Time since onset of injury/illness/exacerbation;Comorbidity 3+;Behavior Pattern    Comorbidities anxiety, migraines, HTN    Examination-Activity Limitations Transfers;Stairs;Locomotion Level;Stand    Examination-Participation Restrictions Community Activity;Cleaning    Stability/Clinical Decision Making Evolving/Moderate complexity    Rehab Potential Good    PT Frequency 2x / week    PT Duration 8 weeks    PT Treatment/Interventions ADLs/Self Care Home Management;DME Instruction;Therapeutic activities;Functional mobility training;Stair training;Gait training;Therapeutic exercise;Balance training;Neuromuscular re-education;Patient/family education;Manual techniques;Passive range of motion;Vestibular    PT Next Visit Plan BWS over treadmill with ~30# unweighted. Performed at 0.63mph at this visit. Continue to work on right anterior pelvic rotation as tends to be posteriorlly rotated to help initiate more hip flexion. Work on weight shifting to right in standing, functional strengthening, gait training with SBQC. Issue picture of seated pelvic tilts ant/ post and lateral on pillow so husband can see to help with carryover.    Consulted and Agree with Plan of Care Patient           Patient will benefit from skilled therapeutic intervention in order to improve the following deficits and impairments:  Abnormal gait,Decreased safety awareness,Decreased range of motion,Decreased mobility,Impaired sensation,Decreased strength,Decreased balance  Visit Diagnosis: Other abnormalities of gait and mobility  Muscle weakness (generalized)  Hemiplegia and hemiparesis following nontraumatic intracerebral hemorrhage affecting right dominant side [facility])     Problem List Patient Active Problem List   Diagnosis Date Noted  .  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 Nontraumatic subcortical hemorrhage of left cerebral hemisphere ([place]) 08/27/2019  . Acute blood loss anemia   . Hypoalbuminemia due to protein-calorie malnutrition ([place])   . Thrombocytopenia ([place])   . Dysphagia 07/29/2019  . Infarction of left basal ganglia ([place]) 07/18/2019  . Hypernatremia   . Leukocytosis   . Essential hypertension   . Global aphasia   . Cytotoxic brain edema ([place]) 07/10/2019  . ICH (intracerebral hemorrhage) ([place]) 07/09/2019  . Anxiety state 02/21/2015  . Cephalalgia 12/21/2014    Electa Sniff, PT, DPT, NCS 11/04/2020, 3:31 PM  [facility] [address] [place], [place], [address] Phone: [phone]   Fax:  [phone]  Name: Audreyana Huntsberry Da Glennie Hawk MRN: [id number] Date of Birth: April 29, 1966

## 2020-11-07 ENCOUNTER — Ambulatory Visit: Payer: Managed Care, Other (non HMO)

## 2020-11-07 ENCOUNTER — Other Ambulatory Visit: Payer: Self-pay

## 2020-11-07 DIAGNOSIS — R2689 Other abnormalities of gait and mobility: Secondary | ICD-10-CM

## 2020-11-07 DIAGNOSIS — I69151 Hemiplegia and hemiparesis following nontraumatic intracerebral hemorrhage affecting right dominant side: Secondary | ICD-10-CM

## 2020-11-07 DIAGNOSIS — R482 Apraxia: Secondary | ICD-10-CM

## 2020-11-07 DIAGNOSIS — R4701 Aphasia: Secondary | ICD-10-CM

## 2020-11-07 DIAGNOSIS — R293 Abnormal posture: Secondary | ICD-10-CM

## 2020-11-07 DIAGNOSIS — R41841 Cognitive communication deficit: Secondary | ICD-10-CM

## 2020-11-07 DIAGNOSIS — M6281 Muscle weakness (generalized): Secondary | ICD-10-CM

## 2020-11-07 NOTE — Therapy (Signed)
[facility] [address] [place], [place], [address] Phone: [phone]   Fax:  [phone]  Speech Language Pathology Treatment  Patient Details  Name: Melanie Madden MRN: [id number] Date of Birth: 02-21-1966 Referring Provider (SLP): Melanie Madden   Encounter Date: 11/07/2020   End of Session - 11/07/20 1431    Visit Number 4    Number of Visits 17    Date for SLP Re-Evaluation 01/17/21    Authorization Time Period 60 days combined    SLP Start Time 1316    SLP Stop Time  1400    SLP Time Calculation (min) 44 min    Activity Tolerance Patient tolerated treatment well;Other (comment)   tearful          Past Medical History:  Diagnosis Date  . Allergy   . Anxiety   . Arthritis   . High triglycerides   . Kidney stones   . Migraines   . Recurrent sinus infections     Past Surgical History:  Procedure Laterality Date  . ANTERIOR CRUCIATE LIGAMENT REPAIR Left   . BUNIONECTOMY Right   . LASIK    . LITHOTRIPSY    . MENISCUS REPAIR Left   . NASAL SEPTUM SURGERY    . SHOULDER SURGERY    . VARICOSE VEIN SURGERY Left     There were no vitals filed for this visit.   Subjective Assessment - 11/07/20 1315    Subjective nodded head    Currently in Pain? No/denies                 ADULT SLP TREATMENT - 11/07/20 1427      General Information   Behavior/Cognition Alert;Cooperative;Pleasant mood;Impulsive      Cognitive-Linquistic Treatment   Treatment focused on Aphasia;Apraxia;Patient/family/caregiver education    Skilled Treatment SLP reviewed apraxic errors from last session /f, v/ in which pt able to produced with ~10% accuracy this session despite max SLP cues and modeling. Visual feedback was ineffective. SLP again trialed some melodic intonation techniques, which was only effective for name production. SLP reviewed low tech AAC board, in which pt able to ID pictures with 83%  accuracy for 12 icons. SLP educated pt's husband on POC and current progress thus far, with recommendation for functional words/phrases to practice.      Assessment / Recommendations / Plan   Plan Continue with current plan of care      Progression Toward Goals   Progression toward goals Progressing toward goals            SLP Education - 11/07/20 1431    Education Details motor speech movemeents, visual feedback, caregiver education    Person(s) Educated Patient;Spouse    Methods Explanation;Demonstration;Handout;Tactile cues;Verbal cues    Comprehension Verbalized understanding;Returned demonstration;Need further instruction;Verbal cues required;Tactile cues required            SLP Short Term Goals - 11/07/20 1341      SLP SHORT TERM GOAL #1   Title Given SLP modeling, pt will simultaneously produce 3 different phonemes for word approximations with 50% accuracy over three sessions    Baseline 10-28-20, 11-04-20    Time 2    Period Weeks   or 9 total visits   Status On-going      SLP SHORT TERM GOAL #2   Title Pt will ID personally relevant objects in f:4 for communication of wants/needs with 75% accuracy with min A over 3 sessions  Baseline 11-04-20, 11-07-20    Time 2    Period Weeks    Status On-going      SLP SHORT TERM GOAL #3   Title Pt will write word or word approximations to aid communication for 3/5 opportunities with min A over 3 sessions.    Baseline 11-07-20    Time 2    Period Weeks    Status On-going      SLP SHORT TERM GOAL #4   Title Pt will use mulitmodal communication (gesture, draw, write 1st letter etc) to augment verbal expression with usual min A over 3 sessions    Baseline 10-28-20 (alphabet board), 11-04-20 (draw), 11-07-20    Time --    Period --    Status Achieved      SLP SHORT TERM GOAL #5   Title Pt will verbalize salient functional phrases with use of MIT with usual min A over 3 sessions    Time 2    Period Weeks    Status On-going             SLP Long Term Goals - 11/07/20 1341      SLP LONG TERM GOAL #1   Title Pt will accurately produce phonemes to facilitate word approximations for 3 personally relevant words with min A over 2 sessions    Time 6    Period Weeks   or 17 total visits   Status On-going      SLP LONG TERM GOAL #2   Title Pt will ID personally relevant objects in f:8 for communication of wants/needs with 75% accuracy given rare min A over 3 sessions    Time 6    Period Weeks    Status On-going      SLP LONG TERM GOAL #3   Title Pt will use mulitmodal communication (gesture, draw, write 1st letter etc) to augment verbal expression to meet needs at home with rare min A from family.    Time 6    Period Weeks    Status On-going      SLP LONG TERM GOAL #4   Title Pt will verbalize salient functional phrases x3 with use of MIT with occasional min A over 3 sessions    Time 6    Period Weeks    Status On-going            Plan - 11/07/20 1433    Clinical Impression Statement Melanie Madden presents today for ST intervention for severe global aphasia and apraxia. SLP targeted articulation placement of errored sounds via mirror and visual cues, which was rarely effective this session. Pt continues to have difficulty with less visual sounds, including palatal, velar, and glottal sounds. SLP again trialed introduction of MIT, which appears was occasionally effective for patient. Pt may benefit from communication board with pictures to reduce frustration and increase communication effectiveness, although pt appears fixated on ability to speak versus use compensations. SLP recommends skilled ST intervention to maximize current communication skills to increase communication effectiveness as well as target speech related tasks as pt desires to speak if possible.    Speech Therapy Frequency 2x / week    Duration 8 weeks   or 17 total visits   Treatment/Interventions Oral motor exercises;Compensatory strategies;Cueing  hierarchy;Functional tasks;Patient/family education;Cognitive reorganization;Multimodal communcation approach;Language facilitation;Compensatory techniques;Internal/external aids;SLP instruction and feedback    Potential to Achieve Goals Fair    Potential Considerations Previous level of function;Severity of impairments    SLP Home Exercise Plan provided  Consulted and Agree with Plan of Care Patient;Family member/caregiver           Patient will benefit from skilled therapeutic intervention in order to improve the following deficits and impairments:   Verbal apraxia  Aphasia  Cognitive communication deficit    Problem List Patient Active Problem List   Diagnosis Date Noted  . 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 Nontraumatic subcortical hemorrhage of left cerebral hemisphere ([place]) 08/27/2019  . Acute blood loss anemia   . Hypoalbuminemia due to protein-calorie malnutrition ([place])   . Thrombocytopenia ([place])   . Dysphagia 07/29/2019  . Infarction of left basal ganglia ([place]) 07/18/2019  . Hypernatremia   . Leukocytosis   . Essential hypertension   . Global aphasia   . Cytotoxic brain edema ([place]) 07/10/2019  . ICH (intracerebral hemorrhage) ([place]) 07/09/2019  . Anxiety state 02/21/2015  . Cephalalgia 12/21/2014    Alinda Deem, MA CCC-SLP 11/07/2020, 2:37 PM  [facility] [address] [place], [place], [address] Phone: [phone]   Fax:  [phone]   Name: Melanie Madden MRN: [id number] Date of Birth: 07-15-1966

## 2020-11-07 NOTE — Therapy (Signed)
[facility] [address] [place], [place], [address] Phone: [phone]   Fax:  [phone]  Physical Therapy Treatment  Patient Details  Name: Melanie Madden MRN: [id number] Date of Birth: 1966/03/02 Referring Provider (PT): Alysia Penna   Encounter Date: 11/07/2020   PT End of Session - 11/07/20 1421    Visit Number 4    Number of Visits 17    PT Start Time 1400    PT Stop Time 1445    PT Time Calculation (min) 45 min    Activity Tolerance Patient tolerated treatment well    Behavior During Therapy [facility] for tasks assessed/performed           Past Medical History:  Diagnosis Date  . Allergy   . Anxiety   . Arthritis   . High triglycerides   . Kidney stones   . Migraines   . Recurrent sinus infections     Past Surgical History:  Procedure Laterality Date  . ANTERIOR CRUCIATE LIGAMENT REPAIR Left   . BUNIONECTOMY Right   . LASIK    . LITHOTRIPSY    . MENISCUS REPAIR Left   . NASAL SEPTUM SURGERY    . SHOULDER SURGERY    . VARICOSE VEIN SURGERY Left     There were no vitals filed for this visit.   Subjective Assessment - 11/07/20 1422    Subjective Pt non verbal responds to yes and no. No new complaints    Pertinent History PMH: anxiety, migraines, HTN,   left basal ganglia ICH 06/2019    Patient Stated Goals Pt unable to verbalize but when PT named off a few things pt agreed she wanted to work on her walking and balance.             Sit to stand: 2 x 5, pt has excessive lean to L LE, verbal and tactile cues to shift weight to R to increase WB to R LE. Pt also has knee thrust against front of table on L due to weakness. Fwd and bwd walking with one rail of paralle bar: 3 x 8 feet Lateral steps at parallel bar: one UE support: 3 x 8 feet. Lateral steps with quad cane on incline: facing each way: 3 laps of each Standing deadlift: 5lb KB from floor: tactile cues to shift  weight to R LE; 10x Box taps: 6" box: R only, mod A, tactile cus and facilitation to bend the knee to reduce circumduction: pt needed >80% assistance Fwd step up: 6" box: 2 x 5 with  mod A plus one rail of parallel bars, tactile cues to shift weight to R LE, cues to not hop with L LE and to block R knee from buckling Uni leg press: 30lbs mod A to push and to maintain R knee from falling out: tactilce cues at knee for approximation for proper muscle activation: R only 5x, 3x, 2x                          PT Short Term Goals - 10/19/20 1911      PT SHORT TERM GOAL #1   Title Pt will decrease 5 x sit to stand from 13.63 sec to <11 sec for improved balance and functional strength.    Baseline 10/19/20 13.63 sec from chair with hand    Time 4    Period Weeks    Status New    Target  Date 11/18/20      PT SHORT TERM GOAL #2   Title Pt will ambulate >500' on level surfaces with [facility] supervision for improved household mobility.    Time 4    Period Weeks    Status New    Target Date 11/18/20             PT Long Term Goals - 10/19/20 1914      PT LONG TERM GOAL #1   Title Pt will be able to perform progressive HEP for strengthening, balance and mobility with assist of husband to continued gains at home.    Time 8    Period Weeks    Status New    Target Date 12/18/20      PT LONG TERM GOAL #2   Title Pt will increase Berg Balance from 31 to >34/56 for improved balance and decreased fall risk.    Baseline 10/19/20 31/56    Time 8    Period Weeks    Status New    Target Date 12/18/20      PT LONG TERM GOAL #3   Title Pt will ambulate > 600' on level outside paved surfaces and ramps with [facility] supervision for improved short community distances.    Time 8    Period Weeks    Status New    Target Date 12/18/20      PT LONG TERM GOAL #4   Title Pt will increase giat speed from 0.29m/s to >0.4 m/s for improved household mobility.    Baseline 10/19/20 0.60m/s     Time 8    Period Weeks    Status New    Target Date 12/18/20                 Plan - 11/07/20 1446    Clinical Impression Statement Pt has poor WB throught R LE with functional transfers, ambulation and activities. Pt requires mod A when manually shifting weight to R LE. Today's session was focused on improving WB through R LE with functional activities.    Personal Factors and Comorbidities Time since onset of injury/illness/exacerbation;Comorbidity 3+;Behavior Pattern    Comorbidities anxiety, migraines, HTN    Examination-Activity Limitations Transfers;Stairs;Locomotion Level;Stand    Examination-Participation Restrictions Community Activity;Cleaning    Stability/Clinical Decision Making Evolving/Moderate complexity    Rehab Potential Good    PT Frequency 2x / week    PT Duration 8 weeks    PT Treatment/Interventions ADLs/Self Care Home Management;DME Instruction;Therapeutic activities;Functional mobility training;Stair training;Gait training;Therapeutic exercise;Balance training;Neuromuscular re-education;Patient/family education;Manual techniques;Passive range of motion;Vestibular    PT Next Visit Plan Continue to work on WB through [place]; BWS over treadmill with ~30# unweighted. Performed at 0.72mph at this visit. Continue to work on right anterior pelvic rotation as tends to be posteriorlly rotated to help initiate more hip flexion. Work on weight shifting to right in standing, functional strengthening, gait training with SBQC. Issue picture of seated pelvic tilts ant/ post and lateral on pillow so husband can see to help with carryover.    Consulted and Agree with Plan of Care Patient           Patient will benefit from skilled therapeutic intervention in order to improve the following deficits and impairments:  Abnormal gait,Decreased safety awareness,Decreased range of motion,Decreased mobility,Impaired sensation,Decreased strength,Decreased balance  Visit Diagnosis: Other  abnormalities of gait and mobility  Muscle weakness (generalized)  Hemiplegia and hemiparesis following nontraumatic intracerebral hemorrhage affecting right dominant side (HCC)  Abnormal posture  Problem List Patient Active Problem List   Diagnosis Date Noted  . 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 Nontraumatic subcortical hemorrhage of left cerebral hemisphere ([place]) 08/27/2019  . Acute blood loss anemia   . Hypoalbuminemia due to protein-calorie malnutrition ([place])   . Thrombocytopenia ([place])   . Dysphagia 07/29/2019  . Infarction of left basal ganglia ([place]) 07/18/2019  . Hypernatremia   . Leukocytosis   . Essential hypertension   . Global aphasia   . Cytotoxic brain edema ([place]) 07/10/2019  . ICH (intracerebral hemorrhage) ([place]) 07/09/2019  . Anxiety state 02/21/2015  . Cephalalgia 12/21/2014    Kerrie Pleasure 11/07/2020, 2:50 PM  [facility] [address] [place], [place], [address] Phone: [phone]   Fax:  [phone]  Name: Melanie Madden MRN: [id number] Date of Birth: 08-Dec-1965

## 2020-11-07 NOTE — Patient Instructions (Addendum)
  Write down 10 favorite food items:      Write down 2-3 favorite hobbies:        Write down 5-10 favorite restaurants:      Write down 5 favorite TV shows:

## 2020-11-09 ENCOUNTER — Ambulatory Visit: Payer: Managed Care, Other (non HMO)

## 2020-11-09 ENCOUNTER — Other Ambulatory Visit: Payer: Self-pay

## 2020-11-09 DIAGNOSIS — R2689 Other abnormalities of gait and mobility: Secondary | ICD-10-CM

## 2020-11-09 DIAGNOSIS — R4701 Aphasia: Secondary | ICD-10-CM

## 2020-11-09 DIAGNOSIS — M6281 Muscle weakness (generalized): Secondary | ICD-10-CM

## 2020-11-09 DIAGNOSIS — R482 Apraxia: Secondary | ICD-10-CM

## 2020-11-09 DIAGNOSIS — R41841 Cognitive communication deficit: Secondary | ICD-10-CM

## 2020-11-09 NOTE — Patient Instructions (Signed)
Access Code: [email or web address] URL: [email or web address] Date: 11/09/2020 Prepared by: Cherly Anderson  Exercises Seated Pelvic Tilt on Balance Disk - 2 x daily - 7 x weekly - 2 sets - 10 reps Seated lateral pelvic tilt on pillow - 2 x daily - 7 x weekly - 2 sets - 10 reps

## 2020-11-09 NOTE — Patient Instructions (Signed)
**  Bring your Lingraphica device next time so we can use it  Try humming some easy songs/lullabies   Write down 5 fruits you like     Write down 5 genres of music you like       Write down 5-10 places you have traveled     Write down 2-5 types of jewelry you like to wear      Write down 5 flowers/plants you like    Write down any chores you help with at home      Write down your order at your favorite restaurants (ex: [employer] order)

## 2020-11-09 NOTE — Therapy (Signed)
[facility] [address] [place], [place], [address] Phone: [phone]   Fax:  [phone]  Speech Language Pathology Treatment  Patient Details  Name: Melanie Madden MRN: [id number] Date of Birth: [birth year] Referring Provider (SLP): Alysia Penna   Encounter Date: 11/09/2020   End of Session - 11/09/20 1525    Visit Number 5    Number of Visits 17    Date for SLP Re-Evaluation 01/17/21    Authorization Time Period 60 days combined    SLP Start Time 1530    SLP Stop Time  1615    SLP Time Calculation (min) 45 min    Activity Tolerance Patient tolerated treatment well;Other (comment) tearful          Past Medical History:  Diagnosis Date  . Allergy   . Anxiety   . Arthritis   . High triglycerides   . Kidney stones   . Migraines   . Recurrent sinus infections     Past Surgical History:  Procedure Laterality Date  . ANTERIOR CRUCIATE LIGAMENT REPAIR Left   . BUNIONECTOMY Right   . LASIK    . LITHOTRIPSY    . MENISCUS REPAIR Left   . NASAL SEPTUM SURGERY    . SHOULDER SURGERY    . VARICOSE VEIN SURGERY Left     There were no vitals filed for this visit.   Subjective Assessment - 11/09/20 1918    Subjective nodded head    Currently in Pain? No/denies                 ADULT SLP TREATMENT - 11/09/20 1526      General Information   Behavior/Cognition Alert;Cooperative;Pleasant mood;Impulsive      Cognitive-Linquistic Treatment   Treatment focused on Aphasia;Apraxia;Patient/family/caregiver education    Skilled Treatment Pt returned HEP from last session, in which pt wrote multiple words in personally relevant categories: favorite foods, hobbies, favorite restaurants, and favorite TV shows. SLP reviewed each word, with 5 accurate pronunications and multiple approximations given consistent SLP modeling and visual cues. Pt used gestures to report she played golf recently.  Questionable gestural perseverations noted which may indicate limb apraxia. Pt independently used writing to cue SLP to 4 languages she spoke, with occasional spelling errors but overall message achieved. SLP trialed singing and short functional phrases ("I want.Marland KitchenMarland KitchenI like...target word), in which pt able to approximate sentence with consistent max SLP modeling. Cues unable to be faded due to persistent apraxic errors. Pt unable to sing "happy birthday" or " you are my sunshine" given written prompts. SLP trialed cued humming, which was only occasionally on rhythm of targeted song.      Assessment / Recommendations / Plan   Plan Continue with current plan of care      Progression Toward Goals   Progression toward goals Progressing toward goals            SLP Education - 11/09/20 1947    Education Details singing, functional phrases, apraxic errors    Person(s) Educated Patient    Methods Explanation;Demonstration;Handout;Tactile cues;Verbal cues    Comprehension Verbalized understanding;Returned demonstration;Need further instruction;Verbal cues required;Tactile cues required            SLP Short Term Goals - 11/09/20 1526      SLP SHORT TERM GOAL #1   Title Given SLP modeling, pt will simultaneously produce 3 different phonemes for word approximations with 50% accuracy over three sessions    Baseline  10-28-20, 11-04-20, 11-09-20    Time --    Period --   or 9 total visits   Status Achieved      SLP SHORT TERM GOAL #2   Title Pt will ID personally relevant objects in f:4 for communication of wants/needs with 75% accuracy with min A over 3 sessions    Baseline 11-04-20, 11-07-20    Time 2    Period Weeks    Status On-going      SLP SHORT TERM GOAL #3   Title Pt will write word or word approximations to aid communication for 3/5 opportunities with min A over 3 sessions.    Baseline 11-07-20, 11-09-20    Time 2    Period Weeks    Status On-going      SLP SHORT TERM GOAL #4   Title Pt  will use mulitmodal communication (gesture, draw, write 1st letter etc) to augment verbal expression with usual min A over 3 sessions    Baseline 10-28-20 (alphabet board), 11-04-20 (draw), 11-07-20, 11-09-20    Status Achieved      SLP SHORT TERM GOAL #5   Title Pt will verbalize salient functional phrases with use of MIT with usual min A over 3 sessions    Time 2    Period Weeks    Status On-going            SLP Long Term Goals - 11/09/20 1526      SLP LONG TERM GOAL #1   Title Pt will accurately produce phonemes to facilitate word approximations for 3 personally relevant words with min A over 2 sessions    Time 6    Period Weeks   or 17 total visits   Status On-going      SLP LONG TERM GOAL #2   Title Pt will ID personally relevant objects in f:8 for communication of wants/needs with 75% accuracy given rare min A over 3 sessions    Time 6    Period Weeks    Status On-going      SLP LONG TERM GOAL #3   Title Pt will use mulitmodal communication (gesture, draw, write 1st letter etc) to augment verbal expression to meet needs at home with rare min A from family.    Time 6    Period Weeks    Status On-going      SLP LONG TERM GOAL #4   Title Pt will verbalize salient functional phrases x3 with use of MIT with occasional min A over 3 sessions    Time 6    Period Weeks    Status On-going            Plan - 11/09/20 1949    Clinical Impression Statement [name] presents today for ST intervention for severe global aphasia and severe apraxia. SLP targeted speech production of functional words in personally relevant categories, with some successful verbalizations and approximations given consistent SLP modeling. Pt continues to have difficulty with less visual sounds, including palatal, velar, and glottal sounds. SLP again trialed melodic sing-song words/phrases/song, which was not effective this session. SLP trialed functional phrases/sentences, with consistent max visual cues and  modeling required to approximate. SLP recommends skilled ST intervention to maximize current communication skills to increase communication effectiveness as well as target speech related tasks as pt desires to speak if possible.    Speech Therapy Frequency 2x / week    Duration 8 weeks   or 17 total visits   Treatment/Interventions Oral motor  exercises;Compensatory strategies;Cueing hierarchy;Functional tasks;Patient/family education;Cognitive reorganization;Multimodal communcation approach;Language facilitation;Compensatory techniques;Internal/external aids;SLP instruction and feedback    Potential to Achieve Goals Fair    Potential Considerations Previous level of function;Severity of impairments    SLP Home Exercise Plan provided    Consulted and Agree with Plan of Care Patient           Patient will benefit from skilled therapeutic intervention in order to improve the following deficits and impairments:   Verbal apraxia  Aphasia  Cognitive communication deficit    Problem List Patient Active Problem List   Diagnosis Date Noted  . 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 Nontraumatic subcortical hemorrhage of left cerebral hemisphere ([place]) 08/27/2019  . Acute blood loss anemia   . Hypoalbuminemia due to protein-calorie malnutrition ([place])   . Thrombocytopenia ([place])   . Dysphagia 07/29/2019  . Infarction of left basal ganglia ([place]) 07/18/2019  . Hypernatremia   . Leukocytosis   . Essential hypertension   . Global aphasia   . Cytotoxic brain edema ([place]) 07/10/2019  . ICH (intracerebral hemorrhage) ([place]) 07/09/2019  . Anxiety state 02/21/2015  . Cephalalgia 12/21/2014    Alinda Deem, MA CCC-SLP 11/09/2020, 7:52 PM  [facility] [address] [place], [place], [address] Phone: [phone]   Fax:  [phone]   Name: Melanie Madden MRN:  [id number] Date of Birth: May 21, 1966

## 2020-11-09 NOTE — Therapy (Signed)
[facility] [address] [place], [place], [address] Phone: [phone]   Fax:  [phone]  Physical Therapy Treatment  Patient Details  Name: Melanie Madden Da Glennie Hawk MRN: [id number] Date of Birth: Dec 14, 1965 Referring Provider (PT): Alysia Penna   Encounter Date: 11/09/2020   PT End of Session - 11/09/20 1618    Visit Number 5    Number of Visits 17    PT Start Time 0174    PT Stop Time 1705    PT Time Calculation (min) 49 min    Activity Tolerance Patient tolerated treatment well    Behavior During Therapy [facility] for tasks assessed/performed           Past Medical History:  Diagnosis Date  . Allergy   . Anxiety   . Arthritis   . High triglycerides   . Kidney stones   . Migraines   . Recurrent sinus infections     Past Surgical History:  Procedure Laterality Date  . ANTERIOR CRUCIATE LIGAMENT REPAIR Left   . BUNIONECTOMY Right   . LASIK    . LITHOTRIPSY    . MENISCUS REPAIR Left   . NASAL SEPTUM SURGERY    . SHOULDER SURGERY    . VARICOSE VEIN SURGERY Left     There were no vitals filed for this visit.   Subjective Assessment - 11/09/20 1618    Subjective Pt non verbal. Denies any new issues when asked.    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    Currently in Pain? No/denies                             [facility] Adult PT Treatment/Exercise - 11/09/20 1619      Ambulation/Gait   Ambulation/Gait Yes    Ambulation/Gait Assistance 5: Supervision    Ambulation/Gait Assistance Details After gait on treadmill ambulated overground with verbal cues to try to increase right stance time and increase left step length. Also occasional tactile cues to increase right pelvic rotation to try to get more hip  flexion and decrease circumduction.    Ambulation Distance (Feet) 115 Feet    Assistive device Small based quad cane   right AFO   Gait Pattern Step-through pattern;Decreased step length - right;Decreased step length - left;Decreased stance time - right;Decreased weight shift to right;Right circumduction    Ambulation Surface Indoor;Level    Gait Comments BWS over treadmill 0.75mph x 10 min with PT student assisting at right leg to initiate hip/knee flexion and PT at pelvis trying to increase right weight shift and anterior pelvic rotation. Pt was given verbal cues to increase right stance time riding leg back and increasing left step length. Pt was uweighted ~30# throughout and right hand was strapped to bar with hand grip to support wrist and kerlex to keep from sliding.      Neuro Re-ed    Neuro Re-ed Details  Sitting on green dynadisc: working on ant/post pelvic tits x 10 the lateral pelvic tilts x 10 with hands in lap and PT stabilizing at legs. Pt was cued to sit up tall throughout. Then sit to stands x5  from green dynadisc with hands in lap with PT heping to facilitate  more right weight shift and verbal cues to lean forward more to rise as well as control descent. Standing in front of mirror for visual cues: stepping forward and back with LLE x 5 then tapping 4" step with LLE x 10 to faciliate right weight shift CGA. Standing in staggered stance with RLE forward with weight shift diagonal forward trying to lead twith pelvis with max tactile cues as pt pushing hip out and keeping trunk posterior.                  PT Education - 11/09/20 2109    Education Details Issued seated pelvic tilts on pillow    Person(s) Educated Patient;Spouse    Methods Explanation;Demonstration;Handout    Comprehension Verbalized understanding            PT Short Term Goals - 10/19/20 1911      PT SHORT TERM GOAL #1   Title Pt will decrease 5 x sit to stand from 13.63 sec to <11 sec for improved  balance and functional strength.    Baseline 10/19/20 13.63 sec from chair with hand    Time 4    Period Weeks    Status New    Target Date 11/18/20      PT SHORT TERM GOAL #2   Title Pt will ambulate >500' on level surfaces with [facility] supervision for improved household mobility.    Time 4    Period Weeks    Status New    Target Date 11/18/20             PT Long Term Goals - 10/19/20 1914      PT LONG TERM GOAL #1   Title Pt will be able to perform progressive HEP for strengthening, balance and mobility with assist of husband to continued gains at home.    Time 8    Period Weeks    Status New    Target Date 12/18/20      PT LONG TERM GOAL #2   Title Pt will increase Berg Balance from 31 to >34/56 for improved balance and decreased fall risk.    Baseline 10/19/20 31/56    Time 8    Period Weeks    Status New    Target Date 12/18/20      PT LONG TERM GOAL #3   Title Pt will ambulate > 600' on level outside paved surfaces and ramps with [facility] supervision for improved short community distances.    Time 8    Period Weeks    Status New    Target Date 12/18/20      PT LONG TERM GOAL #4   Title Pt will increase giat speed from 0.71m/s to >0.4 m/s for improved household mobility.    Baseline 10/19/20 0.68m/s    Time 8    Period Weeks    Status New    Target Date 12/18/20                 Plan - 11/09/20 2110    Clinical Impression Statement Pt continues to show improvement in gait quality with less circumduction and lateral trunk lean to advance RLE. PT continued to work on this with BWS and NMR.    Personal Factors and Comorbidities Time since onset of injury/illness/exacerbation;Comorbidity 3+;Behavior Pattern    Comorbidities anxiety, migraines, HTN    Examination-Activity Limitations Transfers;Stairs;Locomotion Level;Stand    Examination-Participation Restrictions Community Activity;Cleaning    Stability/Clinical Decision Making Evolving/Moderate complexity  Rehab Potential Good    PT Frequency 2x / week    PT Duration 8 weeks    PT Treatment/Interventions ADLs/Self Care Home Management;DME Instruction;Therapeutic activities;Functional mobility training;Stair training;Gait training;Therapeutic exercise;Balance training;Neuromuscular re-education;Patient/family education;Manual techniques;Passive range of motion;Vestibular    PT Next Visit Plan Check STGs. BWS over treadmill with ~30# unweighted. Performed at 0.26mph at this visit. Continue to work on right anterior pelvic rotation as tends to be posteriorlly rotated to help initiate more hip flexion. Work on weight shifting to right in standing, functional strengthening, gait training with SBQC.    Consulted and Agree with Plan of Care Patient           Patient will benefit from skilled therapeutic intervention in order to improve the following deficits and impairments:  Abnormal gait,Decreased safety awareness,Decreased range of motion,Decreased mobility,Impaired sensation,Decreased strength,Decreased balance  Visit Diagnosis: Other abnormalities of gait and mobility  Muscle weakness (generalized)     Problem List Patient Active Problem List   Diagnosis Date Noted  . 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 Nontraumatic subcortical hemorrhage of left cerebral hemisphere ([place]) 08/27/2019  . Acute blood loss anemia   . Hypoalbuminemia due to protein-calorie malnutrition ([place])   . Thrombocytopenia ([place])   . Dysphagia 07/29/2019  . Infarction of left basal ganglia ([place]) 07/18/2019  . Hypernatremia   . Leukocytosis   . Essential hypertension   . Global aphasia   . Cytotoxic brain edema ([place]) 07/10/2019  . ICH (intracerebral hemorrhage) ([place]) 07/09/2019  . Anxiety state 02/21/2015  . Cephalalgia 12/21/2014    Electa Sniff, PT, DPT, NCS 11/09/2020, 9:13 PM  [facility] [address] [place], [place], [address] Phone: [phone]   Fax:  [phone]  Name: Sheylin Scharnhorst Da Glennie Hawk MRN: [id number] Date of Birth: [birth year]

## 2020-11-10 ENCOUNTER — Encounter
Payer: Managed Care, Other (non HMO) | Attending: Physical Medicine & Rehabilitation | Admitting: Physical Medicine & Rehabilitation

## 2020-11-10 ENCOUNTER — Encounter: Payer: Self-pay | Admitting: Physical Medicine & Rehabilitation

## 2020-11-10 VITALS — BP 158/92 | HR 96 | Temp 98.7°F | Ht 67.0 in | Wt 139.6 lb

## 2020-11-10 DIAGNOSIS — F4324 Adjustment disorder with disturbance of conduct: Secondary | ICD-10-CM

## 2020-11-10 DIAGNOSIS — I610 Nontraumatic intracerebral hemorrhage in hemisphere, subcortical: Secondary | ICD-10-CM | POA: Diagnosis present

## 2020-11-10 DIAGNOSIS — R4701 Aphasia: Secondary | ICD-10-CM

## 2020-11-10 DIAGNOSIS — G8101 Flaccid hemiplegia affecting right dominant side: Secondary | ICD-10-CM

## 2020-11-10 MED ORDER — SERTRALINE HCL 50 MG PO TABS: 50.0000 mg | ORAL_TABLET | Freq: Every day | ORAL | 1 refills | Status: DC

## 2020-11-10 MED ORDER — QUETIAPINE FUMARATE 50 MG PO TABS: 50.0000 mg | ORAL_TABLET | Freq: Two times a day (BID) | ORAL | 1 refills | Status: DC

## 2020-11-10 NOTE — Patient Instructions (Signed)
I have made a referral to Neuropsychology

## 2020-11-10 NOTE — Progress Notes (Signed)
Subjective:    Patient ID: Melanie Madden, female    DOB: 02-03-1966, 55 y.o.   MRN: [id number]  HPI  55 yo female with hx of Large Left Lentifrom nucleus hemmorhage, as well as global aphasia, speech apraxia, right spastic hemiparesis . The patient has restarted outpatient PT and speech.  She has had problems with adjustment to her disability.  She has difficulty expressing herself and gets very frustrated ask out.  The patient has refused to see other physicians for this.  I called in Dr. Ilean Skill to introduce him to the patient today and advised that she make an appointment for her adjustment reaction, to help dealing with post CVA deficits as discussed with patient she is over 1 year post stroke and need further improvement she makes will likely be minor. Pain Inventory Average Pain 1 Pain Right Now 0 My pain is intermittent  LOCATION OF PAIN  On the total right side.  BOWEL Number of stools per week: 1 Oral laxative use Yes  Type of laxative colace Enema or suppository use No  History of colostomy No  Incontinent No   BLADDER Normal In and out cath, frequency n/A Able to self cath No  Bladder incontinence No  Frequent urination No  Leakage with coughing No  Difficulty starting stream No  Incomplete bladder emptying No    Mobility use a cane how many minutes can you walk? UNKNOWN ability to climb steps?  yes do you drive?  no use a wheelchair Do you have any goals in this area?  yes  Function disabled: date disabled 2002 I need assistance with the following:  feeding, dressing, bathing, toileting, meal prep, household duties and shopping Do you have any goals in this area?  yes  Neuro/Psych weakness numbness tremor tingling trouble walking  Prior Studies Any changes since last visit?  no  Physicians involved in your care Any changes since last visit?  no   Family History  Problem Relation Age of Onset  . Alzheimer's disease  Mother   . Hypertension Mother   . Diabetes Mother   . Thyroid disease Mother   . Deep vein thrombosis Maternal Grandmother   . Colon cancer Neg Hx   . Colon polyps Neg Hx   . Esophageal cancer Neg Hx   . Pancreatic cancer Neg Hx   . Rectal cancer Neg Hx   . Stomach cancer Neg Hx   . Breast cancer Neg Hx    Social History   Socioeconomic History  . Marital status: Significant Other    Spouse name: Not on file  . Number of children: Not on file  . Years of education: Not on file  . Highest education level: Not on file  Occupational History  . Not on file  Tobacco Use  . Smoking status: Former Research scientist (life sciences)  . Smokeless tobacco: Never Used  Vaping Use  . Vaping Use: Never used  Substance and Sexual Activity  . Alcohol use: Yes    Alcohol/week: 0.0 standard drinks    Comment: social   . Drug use: No  . Sexual activity: Yes    Partners: Male  Other Topics Concern  . Not on file  Social History Narrative  . Not on file   Social Determinants of Health   Financial Resource Strain: Not on file  Food Insecurity: Not on file  Transportation Needs: Not on file  Physical Activity: Not on file  Stress: Not on file  Social  Connections: Not on file   Past Surgical History:  Procedure Laterality Date  . ANTERIOR CRUCIATE LIGAMENT REPAIR Left   . BUNIONECTOMY Right   . LASIK    . LITHOTRIPSY    . MENISCUS REPAIR Left   . NASAL SEPTUM SURGERY    . SHOULDER SURGERY    . VARICOSE VEIN SURGERY Left    Past Medical History:  Diagnosis Date  . Allergy   . Anxiety   . Arthritis   . High triglycerides   . Kidney stones   . Migraines   . Recurrent sinus infections    Pulse 96   Temp 98.7 F (37.1 C)   Ht 5\' 7"  (1.702 m)   Wt 139 lb 9.6 oz (63.3 kg)   SpO2 98%   BMI 21.86 kg/m   Opioid Risk Score:   Fall Risk Score:  `1  Depression screen PHQ 2/9  Depression screen [facility] 2/9 05/06/2020 02/05/2020 09/25/2019  Decreased Interest 0 3 0  Down, Depressed, Hopeless 0 3 1  PHQ  - 2 Score 0 6 1  Altered sleeping - - 0  Tired, decreased energy - - 0  Change in appetite - - 0  Feeling bad or failure about yourself  - - 0  Trouble concentrating - - 0  Moving slowly or fidgety/restless - - 1  Suicidal thoughts - - 0  PHQ-9 Score - - 2  Difficult doing work/chores - - Somewhat difficult  Some recent data might be hidden   Review of Systems  Musculoskeletal: Positive for gait problem.       Total right side pain, numbness  All other systems reviewed and are negative.      Objective:   Physical Exam Vitals and nursing note reviewed.  Constitutional:      Appearance: She is normal weight.  HENT:     Head: Normocephalic and atraumatic.  Eyes:     Extraocular Movements: Extraocular movements intact.     Conjunctiva/sclera: Conjunctivae normal.     Pupils: Pupils are equal, round, and reactive to light.  Musculoskeletal:     Cervical back: Normal range of motion.  Skin:    General: Skin is warm and dry.  Neurological:     Mental Status: She is alert.     Cranial Nerves: Dysarthria present.     Motor: Weakness present.     Coordination: Coordination abnormal.     Deep Tendon Reflexes:     Reflex Scores:      Patellar reflexes are 3+ on the right side.      Achilles reflexes are 2+ on the right side.    Comments: The patient has difficulty producing sounds she does not have the ability to state say yes or no but is able to nod her head. She can follow simple commands sometimes needing repetition.  She can follow gestural cues for simple commands. Motor strength is 0/5 in the right deltoid bicep tricep grip 3 - at the right hip flexor 4 - at the knee extensor 0 ankle dorsiflexion plantar flexor, hip adduction is 2/5 Sensation difficult to assess secondary to aphasia she his head had no whether her hands feel the same whether she can feel me touching her hand or her foot.  Psychiatric:        Attention and Perception: Attention normal.        Mood and  Affect: Affect is labile.        Cognition and Memory: Cognition is impaired.  Comments: Patient cries at times Aphasia with speech apraxia           Assessment & Plan:  #1.  History of left subcortical infarct with hemorrhagic conversion, the patient had cerebral edema sounding fairly large hemorrhage.  She has had a persistent global aphasia verbal apraxia and right hemiplegia.  She has episodes of frustration and agitation and remains on Seroquel 50 mg twice daily as well as sertraline 50 mg daily. She has been introduced to neuropsychologist Dr. Ilean Skill and made referral for her to see him and encouraged her to keep appointment. Continue outpatient PT speech Continue Seroquel 50 mg twice daily Continue sertraline 50 mg daily I will see the patient back in 3 months

## 2020-11-15 ENCOUNTER — Ambulatory Visit: Payer: Managed Care, Other (non HMO)

## 2020-11-15 ENCOUNTER — Other Ambulatory Visit: Payer: Self-pay

## 2020-11-15 DIAGNOSIS — R4701 Aphasia: Secondary | ICD-10-CM

## 2020-11-15 DIAGNOSIS — R2689 Other abnormalities of gait and mobility: Secondary | ICD-10-CM | POA: Diagnosis not present

## 2020-11-15 DIAGNOSIS — R482 Apraxia: Secondary | ICD-10-CM

## 2020-11-15 NOTE — Therapy (Signed)
[facility] [address] [place], [place], [address] Phone: [phone]   Fax:  [phone]  Speech Language Pathology Treatment  Patient Details  Name: Melanie Madden Da Glennie Hawk MRN: [id number] Date of Birth: [birth year] Referring Provider (SLP): Alysia Penna   Encounter Date: 11/15/2020   End of Session - 11/15/20 1432    Visit Number 6    Number of Visits 17    Date for SLP Re-Evaluation 01/17/21    Authorization Time Period 60 days combined    SLP Start Time 1315    SLP Stop Time  1400    SLP Time Calculation (min) 45 min    Activity Tolerance Patient tolerated treatment well           Past Medical History:  Diagnosis Date  . Allergy   . Anxiety   . Arthritis   . High triglycerides   . Kidney stones   . Migraines   . Recurrent sinus infections     Past Surgical History:  Procedure Laterality Date  . ANTERIOR CRUCIATE LIGAMENT REPAIR Left   . BUNIONECTOMY Right   . LASIK    . LITHOTRIPSY    . MENISCUS REPAIR Left   . NASAL SEPTUM SURGERY    . SHOULDER SURGERY    . VARICOSE VEIN SURGERY Left     There were no vitals filed for this visit.   Subjective Assessment - 11/15/20 1319    Subjective nodded head    Currently in Pain? No/denies                 ADULT SLP TREATMENT - 11/15/20 1319      General Information   Behavior/Cognition Alert;Cooperative;Pleasant mood;Impulsive            SLP Education - 11/15/20 1431    Education Details phonemes versus letters placement, Lingraphica, visual feedback    Person(s) Educated Patient    Methods Explanation;Demonstration;Tactile cues;Handout;Verbal cues    Comprehension Verbalized understanding;Returned demonstration;Verbal cues required;Tactile cues required;Need further instruction            SLP Short Term Goals - 11/15/20 1432      SLP SHORT TERM GOAL #1   Title Given SLP modeling, pt will simultaneously produce 3  different phonemes for word approximations with 50% accuracy over three sessions    Baseline 10-28-20, 11-04-20, 11-09-20    Period --   or 9 total visits   Status Achieved      SLP SHORT TERM GOAL #2   Title Pt will ID personally relevant objects in f:4 for communication of wants/needs with 75% accuracy with min A over 3 sessions    Baseline 11-04-20, 11-07-20    Time 1    Period Weeks    Status On-going      SLP SHORT TERM GOAL #3   Title Pt will write word or word approximations to aid communication for 3/5 opportunities with min A over 3 sessions.    Baseline 11-07-20, 11-09-20    Time 1    Period Weeks    Status On-going      SLP SHORT TERM GOAL #4   Title Pt will use mulitmodal communication (gesture, draw, write 1st letter etc) to augment verbal expression with usual min A over 3 sessions    Baseline 10-28-20 (alphabet board), 11-04-20 (draw), 11-07-20, 11-09-20    Status Achieved      SLP SHORT TERM GOAL #5   Title Pt will verbalize salient functional  phrases with use of MIT with usual min A over 3 sessions    Time 1    Period Weeks    Status On-going            SLP Long Term Goals - 11/15/20 1433      SLP LONG TERM GOAL #1   Title Pt will accurately produce phonemes to facilitate word approximations for 3 personally relevant words with min A over 2 sessions    Time 5    Period Weeks   or 17 total visits   Status On-going      SLP LONG TERM GOAL #2   Title Pt will ID personally relevant objects in f:8 for communication of wants/needs with 75% accuracy given rare min A over 3 sessions    Time 5    Period Weeks    Status On-going      SLP LONG TERM GOAL #3   Title Pt will use mulitmodal communication (gesture, draw, write 1st letter etc) to augment verbal expression to meet needs at home with rare min A from family.    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 SLP targeted speech production of functional words in personally relevant categories, with accurate verbalizations x4 and partial word/sound approximations x10 noted given consistent SLP verbal/visual modeling. Without SLP modeling, rare accuracy for independent verbalizations noted. Pt continues to have difficulty with less visual sounds, including palatal, velar, and glottal sounds, with SLP recommending pt focus on "phonemes" in [place] for visual feedback.  SLP recommends skilled ST intervention to maximize current communication skills to increase communication effectiveness as well as target speech related tasks as pt desires to speak if possible.    Speech Therapy Frequency 2x / week    Duration 8 weeks   or 17 total visits   Treatment/Interventions Oral motor exercises;Compensatory strategies;Cueing hierarchy;Functional tasks;Patient/family education;Cognitive reorganization;Multimodal communcation approach;Language facilitation;Compensatory techniques;Internal/external aids;SLP instruction and feedback    Potential to [place] provided    Consulted and Agree with Plan of Care Patient           Patient will benefit from skilled therapeutic intervention in order to improve the following deficits and impairments:   Verbal apraxia  Aphasia    Problem List Patient Active Problem List   Diagnosis Date Noted  . 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 Nontraumatic subcortical hemorrhage of left cerebral hemisphere ([place]) 08/27/2019  . Acute blood loss anemia   . Hypoalbuminemia due to protein-calorie malnutrition ([place])   . Thrombocytopenia ([place])   . Dysphagia 07/29/2019   . Infarction of left basal ganglia ([place]) 07/18/2019  . Hypernatremia   . Leukocytosis   . Essential hypertension   . Global aphasia   . Cytotoxic brain edema ([place]) 07/10/2019  . ICH (intracerebral hemorrhage) ([place]) 07/09/2019  . Anxiety state 02/21/2015  . Cephalalgia 12/21/2014    Alinda Deem, MA CCC-SLP 11/15/2020, 2:37 PM  [facility] [address] [place], [place], [address] Phone: [phone]   Fax:  [phone]   Name: Axelle Szwed Da Glennie Hawk MRN: [id number] Date of Birth: July 14, 1966

## 2020-11-15 NOTE — Patient Instructions (Signed)
  Practice "Phonemes" on Lingraphica to practice your speech sounds. I know there is a lot of them, so maybe focus on 5-10 sounds during each practice.

## 2020-11-17 ENCOUNTER — Ambulatory Visit: Payer: Managed Care, Other (non HMO)

## 2020-11-17 ENCOUNTER — Other Ambulatory Visit: Payer: Self-pay

## 2020-11-17 DIAGNOSIS — R2689 Other abnormalities of gait and mobility: Secondary | ICD-10-CM | POA: Diagnosis not present

## 2020-11-17 DIAGNOSIS — R482 Apraxia: Secondary | ICD-10-CM

## 2020-11-17 DIAGNOSIS — R41841 Cognitive communication deficit: Secondary | ICD-10-CM

## 2020-11-17 DIAGNOSIS — R4701 Aphasia: Secondary | ICD-10-CM

## 2020-11-17 NOTE — Patient Instructions (Signed)
  Practice first 10 phonemes again to solidify your practice  Use your Lingraphica in situations you can't say or gesture what you want   Practice these sayings: Hi!  Bye!  I want sushi   PF [employer]   Happy birthday [name]!

## 2020-11-17 NOTE — Therapy (Signed)
[facility] [address] [place], [place], [address] Phone: [phone]   Fax:  [phone]  Speech Language Pathology Treatment  Patient Details  Name: Melanie Madden Da Glennie Hawk MRN: [id number] Date of Birth: 1966-06-12 Referring Provider (SLP): Alysia Penna   Encounter Date: 11/17/2020   End of Session - 11/17/20 1312    Visit Number 7    Number of Visits 17    Date for SLP Re-Evaluation 01/17/21    Authorization Time Period 60 days combined    SLP Start Time 1315    SLP Stop Time  1400    SLP Time Calculation (min) 45 min    Activity Tolerance Patient tolerated treatment well           Past Medical History:  Diagnosis Date  . Allergy   . Anxiety   . Arthritis   . High triglycerides   . Kidney stones   . Migraines   . Recurrent sinus infections     Past Surgical History:  Procedure Laterality Date  . ANTERIOR CRUCIATE LIGAMENT REPAIR Left   . BUNIONECTOMY Right   . LASIK    . LITHOTRIPSY    . MENISCUS REPAIR Left   . NASAL SEPTUM SURGERY    . SHOULDER SURGERY    . VARICOSE VEIN SURGERY Left     There were no vitals filed for this visit.   Subjective Assessment - 11/17/20 1312    Subjective nodded head    Currently in Pain? No/denies                 ADULT SLP TREATMENT - 11/17/20 1312      General Information   Behavior/Cognition Alert;Cooperative;Pleasant mood;Impulsive      Cognitive-Linquistic Treatment   Treatment focused on Aphasia;Apraxia;Patient/family/caregiver education    Skilled Treatment SLP reviewed phonemic videos on Lingraphica for the production of bilabials, labiodentals, and some alveolar sounds. Pt noted with improved accuracy when prompted via video versus SLP modeling. Usual consistent cues required to correct voiced versus voiceless, with pt exhibiting decreased awareness of errored sounds (ex: /m/ vs. /b/). SLP trialed simple conversational words (hi  and bye) requiring usual verbal and visual cues to executive effectively. Pt communicated her husband's birthday was today and his current age via writing without prompting. Some spelling errors noted; however, overall message was able to be inferred. SLP used yes/no to ID restaurant for tonight, with pt using independent gestures x1 to communicate her food items (drew sushi roll). SLP discussed use of Lingraphica in which pt continues to exhibit limited interest as communication aid.      Assessment / Recommendations / Plan   Plan Continue with current plan of care      Progression Toward Goals   Progression toward goals Progressing toward goals            SLP Education - 11/17/20 1407    Education Details phonemic production, using lingraphica    Person(s) Educated Patient    Methods Tactile cues;Verbal cues;Explanation;Demonstration;Handout    Comprehension Verbalized understanding;Returned demonstration;Verbal cues required;Tactile cues required;Need further instruction            SLP Short Term Goals - 11/17/20 1313      SLP SHORT TERM GOAL #1   Title Given SLP modeling, pt will simultaneously produce 3 different phonemes for word approximations with 50% accuracy over three sessions    Baseline 10-28-20, 11-04-20, 11-09-20    Period --   or 9 total  visits   Status Achieved      SLP SHORT TERM GOAL #2   Title Pt will ID personally relevant objects in f:4 for communication of wants/needs with 75% accuracy with min A over 3 sessions    Baseline 11-04-20, 11-07-20    Time 1    Period Weeks    Status On-going      SLP SHORT TERM GOAL #3   Title Pt will write word or word approximations to aid communication for 3/5 opportunities with min A over 3 sessions.    Baseline 11-07-20, 11-09-20, 11-17-20    Time --    Period --    Status Achieved      SLP SHORT TERM GOAL #4   Title Pt will use mulitmodal communication (gesture, draw, write 1st letter etc) to augment verbal expression with  usual min A over 3 sessions    Baseline 10-28-20 (alphabet board), 11-04-20 (draw), 11-07-20, 11-09-20    Status Achieved      SLP SHORT TERM GOAL #5   Title Pt will verbalize salient functional phrases with use of MIT with usual min A over 3 sessions    Time 1    Period Weeks    Status On-going            SLP Long Term Goals - 11/17/20 1313      SLP LONG TERM GOAL #1   Title Pt will accurately produce phonemes to facilitate word approximations for 3 personally relevant words with min A over 2 sessions    Time 5    Period Weeks   or 17 total visits   Status On-going      SLP LONG TERM GOAL #2   Title Pt will ID personally relevant objects in f:8 for communication of wants/needs with 75% accuracy given rare min A over 3 sessions    Time 5    Period Weeks    Status On-going      SLP LONG TERM GOAL #3   Title Pt will use mulitmodal communication (gesture, draw, write 1st letter etc) to augment verbal expression to meet needs at home with rare min A from family.    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 SLP targeted speech production of functional conversational words (hi, bye, etc), with mod to max consistent SLP modeling required with ~50 % accurate production. SLP targeted production of specific phonemes via [place], with usual SLP cues required to correct voiced versus voiceless.  Pt largely unaware of speech errors without max cues.Tactile cues were ineffective for open mouth positioning for velar and glottal sounds. SLP trialed simple CV, which was approximately ~50% accurate. Pt independently wrote words/approximations and drew  with 50% accuracy. Pt continues to express dislike of Lingraphica despite SLP education and recommendation to use as communication aid. SLP recommends skilled ST intervention to maximize current communication skills to increase communication effectiveness as well as target speech related tasks as pt desires to speak if possible.    Speech  Therapy Frequency 2x / week    Duration 8 weeks   or 17 total visits   Treatment/Interventions Oral motor exercises;Compensatory strategies;Cueing hierarchy;Functional tasks;Patient/family education;Cognitive reorganization;Multimodal communcation approach;Language facilitation;Compensatory techniques;Internal/external aids;SLP instruction and feedback    Potential to Achieve Goals [place]    Potential Considerations Previous level of function;Severity of impairments    SLP Home Exercise Plan provided    Consulted and Agree with Plan of Care Patient           Patient will benefit from skilled therapeutic intervention in order to improve the following deficits and impairments:   Verbal apraxia  Aphasia  Cognitive communication deficit    Problem List Patient Active Problem List   Diagnosis Date Noted  . 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 Nontraumatic subcortical hemorrhage of left cerebral hemisphere ([place]) 08/27/2019  . Acute blood loss anemia   . Hypoalbuminemia due to protein-calorie malnutrition ([place])   . Thrombocytopenia ([place])   . Dysphagia 07/29/2019  . Infarction of left basal ganglia ([place]) 07/18/2019  . Hypernatremia   . Leukocytosis   . Essential hypertension   . Global aphasia   . Cytotoxic brain edema ([place]) 07/10/2019  . ICH (intracerebral hemorrhage) ([place]) 07/09/2019  . Anxiety state 02/21/2015  . Cephalalgia 12/21/2014    Alinda Deem, MA CCC-SLP 11/17/2020, 2:30 PM  [facility] [address] [place], [place],  [address] Phone: [phone]   Fax:  [phone]   Name: Nashayla Telleria Da Glennie Hawk MRN: [id number] Date of Birth: [birth year]

## 2020-11-21 ENCOUNTER — Other Ambulatory Visit: Payer: Self-pay

## 2020-11-21 ENCOUNTER — Ambulatory Visit: Payer: Managed Care, Other (non HMO)

## 2020-11-21 DIAGNOSIS — R41841 Cognitive communication deficit: Secondary | ICD-10-CM

## 2020-11-21 DIAGNOSIS — I69151 Hemiplegia and hemiparesis following nontraumatic intracerebral hemorrhage affecting right dominant side: Secondary | ICD-10-CM

## 2020-11-21 DIAGNOSIS — R2689 Other abnormalities of gait and mobility: Secondary | ICD-10-CM | POA: Diagnosis not present

## 2020-11-21 DIAGNOSIS — M6281 Muscle weakness (generalized): Secondary | ICD-10-CM

## 2020-11-21 DIAGNOSIS — R482 Apraxia: Secondary | ICD-10-CM

## 2020-11-21 DIAGNOSIS — R4701 Aphasia: Secondary | ICD-10-CM

## 2020-11-21 NOTE — Patient Instructions (Signed)
  Try saying some of these words that start with "m" and "t". You will probably need someone to model them for you so you say them correctly   Try "Word vault essential" app for more specific sounds.    Try sentences starting with these.  Fill in those sentences with specific words. Writing them out is helpful.   "I want..." "I like..." "I need..." "I eat..." "I watch..."

## 2020-11-21 NOTE — Therapy (Signed)
[facility] [address] [place], [place], [address] Phone: [phone]   Fax:  [phone]  Speech Language Pathology Treatment  Patient Details  Name: Melanie Madden MRN: [id number] Date of Birth: 1966-01-12 Referring Provider (SLP): Alysia Penna   Encounter Date: 11/21/2020   End of Session - 11/21/20 1453    Visit Number 8    Number of Visits 17    Date for SLP Re-Evaluation 01/17/21    Authorization Time Period 60 days combined    SLP Start Time 1315    SLP Stop Time  1400    SLP Time Calculation (min) 45 min    Activity Tolerance Patient tolerated treatment well           Past Medical History:  Diagnosis Date  . Allergy   . Anxiety   . Arthritis   . High triglycerides   . Kidney stones   . Migraines   . Recurrent sinus infections     Past Surgical History:  Procedure Laterality Date  . ANTERIOR CRUCIATE LIGAMENT REPAIR Left   . BUNIONECTOMY Right   . LASIK    . LITHOTRIPSY    . MENISCUS REPAIR Left   . NASAL SEPTUM SURGERY    . SHOULDER SURGERY    . VARICOSE VEIN SURGERY Left     There were no vitals filed for this visit.   Subjective Assessment - 11/21/20 1315    Subjective nodded head    Currently in Pain? No/denies                 ADULT SLP TREATMENT - 11/21/20 1317      General Information   Behavior/Cognition Alert;Cooperative;Pleasant mood;Impulsive      Cognitive-Linquistic Treatment   Treatment focused on Aphasia;Apraxia;Patient/family/caregiver education    Skilled Treatment SLP reviewed phonemic videos on Lingraphica for the production of bilabials, labiodentals, and some alveolar sounds. Pt unable to distinguish between /p/ and /m/ with max SLP visual and written cues. Accurate production noted on second attempt; however, inconsistent carryover exhibited. Pt able to produce /h/ with verbal cue this session. SLP trialed simple functional sentences  with consistent SLP modeling, with approximations noted 75% of time. Without modeling, limited carryover noted due to apraxic errors. Pt showed SLP artic practice on phone, in which pt stated "wash" for far, "touch" for tarp, and accurately verbalized "bark." SLP provided additional articulation practice of words for consistent successful phonemes /m, t/.      Assessment / Recommendations / Plan   Plan Continue with current plan of care      Progression Toward Goals   Progression toward goals Progressing toward goals            SLP Education - 11/21/20 1453    Education Details phoneme production, functional phrases    Person(s) Educated Patient    Methods Explanation;Demonstration;Tactile cues;Verbal cues;Handout    Comprehension Verbalized understanding;Returned demonstration;Verbal cues required;Tactile cues required;Need further instruction            SLP Short Term Goals - 11/21/20 1454      SLP SHORT TERM GOAL #1   Title Given SLP modeling, pt will simultaneously produce 3 different phonemes for word approximations with 50% accuracy over three sessions    Baseline 10-28-20, 11-04-20, 11-09-20    Period --   or 9 total visits   Status Achieved      SLP SHORT TERM GOAL #2   Title Pt will ID personally  relevant objects in f:4 for communication of wants/needs with 75% accuracy with min A over 3 sessions    Baseline 11-04-20, 11-07-20    Status Partially Met      SLP SHORT TERM GOAL #3   Title Pt will write word or word approximations to aid communication for 3/5 opportunities with min A over 3 sessions.    Baseline 11-07-20, 11-09-20, 11-17-20    Status Achieved      SLP SHORT TERM GOAL #4   Title Pt will use mulitmodal communication (gesture, draw, write 1st letter etc) to augment verbal expression with usual min A over 3 sessions    Baseline 10-28-20 (alphabet board), 11-04-20 (draw), 11-07-20, 11-09-20    Status Achieved      SLP SHORT TERM GOAL #5   Title Pt will verbalize salient  functional phrases with use of MIT with usual min A over 3 sessions    Status Not Met            SLP Long Term Goals - 11/21/20 1454      SLP LONG TERM GOAL #1   Title Pt will accurately produce phonemes to facilitate word approximations for 3 personally relevant words with min A over 2 sessions    Time 4    Period Weeks   or 17 total visits   Status On-going      SLP LONG TERM GOAL #2   Title Pt will ID personally relevant objects in f:8 for communication of wants/needs with 75% accuracy given rare min A over 3 sessions    Time 4    Period Weeks    Status On-going      SLP LONG TERM GOAL #3   Title Pt will use mulitmodal communication (gesture, draw, write 1st letter etc) to augment verbal expression to meet needs at home with rare min A from family.    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 SLP targeted speech production of functional conversational words (hi, bye, etc), with mod to max consistent SLP modeling required with ~50 % accurate production. SLP targeted production of specific phonemes via [place], with usual SLP cues required to differentiate between voiced versus voiceless.  Pt largely unaware of speech errors despite max cues. Max tactile cues were effective for labiodental phonemes for ~50% of trials. SLP trialed simple functional phrases, which was approximately ~50% accurate with max SLP modeling. SLP recommends skilled ST intervention to maximize current communication skills to increase communication effectiveness as well as target speech related tasks as pt desires to speak if possible.    Speech Therapy  Frequency 2x / week    Duration 8 weeks   or 17 total visits   Treatment/Interventions Oral motor exercises;Compensatory strategies;Cueing hierarchy;Functional tasks;Patient/family education;Cognitive reorganization;Multimodal communcation approach;Language facilitation;Compensatory techniques;Internal/external aids;SLP instruction and feedback    Potential to [place] provided    Consulted and Agree with Plan of Care Patient  Patient will benefit from skilled therapeutic intervention in order to improve the following deficits and impairments:   Verbal apraxia  Aphasia  Cognitive communication deficit    Problem List Patient Active Problem List   Diagnosis Date Noted  . 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 Nontraumatic subcortical hemorrhage of left cerebral hemisphere ([place]) 08/27/2019  . Acute blood loss anemia   . Hypoalbuminemia due to protein-calorie malnutrition ([place])   . Thrombocytopenia ([place])   . Dysphagia 07/29/2019  . Infarction of left basal ganglia ([place]) 07/18/2019  . Hypernatremia   . Leukocytosis   . Essential hypertension   . Global aphasia   . Cytotoxic brain edema ([place]) 07/10/2019  . ICH (intracerebral hemorrhage) ([place]) 07/09/2019  . Anxiety state 02/21/2015  . Cephalalgia 12/21/2014    Alinda Deem, MA CCC-SLP 11/21/2020, 2:56 PM  [facility] [address] [place], [place], [address] Phone: [phone]   Fax:  [phone]   Name: Melanie Madden MRN: [id number] Date of Birth: 01/08/1966

## 2020-11-21 NOTE — Therapy (Signed)
[facility] [address] [place], [place], [address] Phone: [phone]   Fax:  [phone]  Physical Therapy Treatment  Patient Details  Name: Melanie Madden MRN: [id number] Date of Birth: 1965-11-15 Referring Provider (PT): Alysia Penna   Encounter Date: 11/21/2020   PT End of Session - 11/21/20 1231    Visit Number 6    Number of Visits 17    PT Start Time 1230    PT Stop Time 1312    PT Time Calculation (min) 42 min    Activity Tolerance Patient tolerated treatment well    Behavior During Therapy [facility] for tasks assessed/performed           Past Medical History:  Diagnosis Date  . Allergy   . Anxiety   . Arthritis   . High triglycerides   . Kidney stones   . Migraines   . Recurrent sinus infections     Past Surgical History:  Procedure Laterality Date  . ANTERIOR CRUCIATE LIGAMENT REPAIR Left   . BUNIONECTOMY Right   . LASIK    . LITHOTRIPSY    . MENISCUS REPAIR Left   . NASAL SEPTUM SURGERY    . SHOULDER SURGERY    . VARICOSE VEIN SURGERY Left     There were no vitals filed for this visit.   Subjective Assessment - 11/21/20 1232    Subjective Pt non verbal. Denies any new issues when asked.    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    Currently in Pain? No/denies                             [facility] Adult PT Treatment/Exercise - 11/21/20 1232      Transfers   Transfers Sit to Stand;Stand to Sit    Sit to Stand 5: Supervision    Sit to Stand Details Verbal cues for technique;Tactile cues for weight shifting    Five time sit to stand comments  16.45 sec from chair with left hand    Stand to Sit 5: Supervision    Stand to Sit Details (indicate cue type and reason) Verbal cues  for technique;Tactile cues for weight shifting      Ambulation/Gait   Ambulation/Gait Yes    Ambulation/Gait Assistance 5: Supervision    Ambulation/Gait Assistance Details Pt was cued to try to bring right pelvis forward with stepping which she was able to do better but still using circumduction to help clear right leg.    Ambulation Distance (Feet) 575 Feet    Assistive device Small based quad cane   right AFO   Gait Pattern Step-through pattern;Decreased step length - left;Decreased hip/knee flexion - right;Decreased arm swing - right    Ambulation Surface Level;Indoor      Neuro Re-ed    Neuro Re-ed Details  Dynamic gait over obstacle course: weaving  in and out of 5 cones and reciprocal steps over 4 yard sticks with PT helping to facilitate weight shift x 5. At counter: stepping over 2x4" bolster with LLE x 8 and back to increase right weight shift CGA and mirror in front for visual cues. Standing with LLE on bolster and holding upright posture x 15 sec  with LUE support x 5. Pt was given verbal and tactile cues to stand up tall and not lean on counter. Standing with RLE on 4" step and weight shifting forward and back x 8. Sit to stand x 5 from mat with LUE support with PT helping to facilitate right weight shift then x 5 with reaching LUE forward and not pushing. Pt was cued not to twist to left when rising.                    PT Short Term Goals - 11/21/20 1234      PT SHORT TERM GOAL #1   Title Pt will decrease 5 x sit to stand from 13.63 sec to <11 sec for improved balance and functional strength.    Baseline 10/19/20 13.63 sec from chair with hand. 11/21/20 16.45 sec from chair with hand    Time 4    Period Weeks    Status Not Met    Target Date 11/18/20      PT SHORT TERM GOAL #2   Title Pt will ambulate >500' on level surfaces with [facility] supervision for improved household mobility.    Baseline 11/21/20 575' with SBQC and AFO supervision    Time 4    Period Weeks     Status Achieved    Target Date 11/18/20             PT Long Term Goals - 10/19/20 1914      PT LONG TERM GOAL #1   Title Pt will be able to perform progressive HEP for strengthening, balance and mobility with assist of husband to continued gains at home.    Time 8    Period Weeks    Status New    Target Date 12/18/20      PT LONG TERM GOAL #2   Title Pt will increase Berg Balance from 31 to >34/56 for improved balance and decreased fall risk.    Baseline 10/19/20 31/56    Time 8    Period Weeks    Status New    Target Date 12/18/20      PT LONG TERM GOAL #3   Title Pt will ambulate > 600' on level outside paved surfaces and ramps with [facility] supervision for improved short community distances.    Time 8    Period Weeks    Status New    Target Date 12/18/20      PT LONG TERM GOAL #4   Title Pt will increase giat speed from 0.75m/s to >0.4 m/s for improved household mobility.    Baseline 10/19/20 0.65m/s    Time 8    Period Weeks    Status New    Target Date 12/18/20                 Plan - 11/21/20 1318    Clinical Impression Statement PT assessed STGs today. Pt was able to meet gait goal on level surfaces at supervision level. Pt continues to show improved safety awareness with pausing if RLE does not advance fully with more upright posture. She did not meet 5 x sit to stand goal. Still mostly weight shifted all to left when rising. Pt will continue to benefit from skilled PT to continue to address strength, balance and functional mobility deficits.    Personal Factors and Comorbidities Time since onset of injury/illness/exacerbation;Comorbidity 3+;Behavior Pattern    Comorbidities anxiety, migraines, HTN    Examination-Activity Limitations Transfers;Stairs;Locomotion Level;Stand  Examination-Participation Restrictions Community Activity;Cleaning    Stability/Clinical Decision Making Evolving/Moderate complexity    Rehab Potential Good    PT Frequency 2x / week     PT Duration 8 weeks    PT Treatment/Interventions ADLs/Self Care Home Management;DME Instruction;Therapeutic activities;Functional mobility training;Stair training;Gait training;Therapeutic exercise;Balance training;Neuromuscular re-education;Patient/family education;Manual techniques;Passive range of motion;Vestibular    PT Next Visit Plan BWS over treadmill with ~30# unweighted. Performed at 0.71mph last visit with medium harness. Continue to work on right anterior pelvic rotation as tends to be posteriorlly rotated to help initiate more hip flexion. Work on weight shifting to right in standing, functional strengthening, gait training with SBQC.    Consulted and Agree with Plan of Care Patient           Patient will benefit from skilled therapeutic intervention in order to improve the following deficits and impairments:  Abnormal gait,Decreased safety awareness,Decreased range of motion,Decreased mobility,Impaired sensation,Decreased strength,Decreased balance  Visit Diagnosis: Other abnormalities of gait and mobility  Muscle weakness (generalized)  Hemiplegia and hemiparesis following nontraumatic intracerebral hemorrhage affecting right dominant side [facility])     Problem List Patient Active Problem List   Diagnosis Date Noted  . 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 Nontraumatic subcortical hemorrhage of left cerebral hemisphere ([place]) 08/27/2019  . Acute blood loss anemia   . Hypoalbuminemia due to protein-calorie malnutrition ([place])   . Thrombocytopenia ([place])   . Dysphagia 07/29/2019  . Infarction of left basal ganglia ([place]) 07/18/2019  . Hypernatremia   . Leukocytosis   . Essential hypertension   . Global aphasia   . Cytotoxic brain edema ([place]) 07/10/2019  . ICH (intracerebral hemorrhage) ([place]) 07/09/2019  . Anxiety state 02/21/2015  . Cephalalgia 12/21/2014    Electa Sniff, PT, DPT, NCS 11/21/2020, 1:21 PM  [facility] [address] [place], [place], [address] Phone: [phone]   Fax:  [phone]  Name: Ashonte Angelucci Da Glennie Madden MRN: [id number] Date of Birth: 02/18/1966

## 2020-11-23 ENCOUNTER — Telehealth: Payer: Self-pay | Admitting: *Deleted

## 2020-11-23 NOTE — Telephone Encounter (Signed)
Mr Melanie Madden called and is asking that Dr Letta Pate give him a call to discuss how the appt went with Dr Sima Matas, since the caregiver was not allowed to be in the room.  Please call him at [phone].

## 2020-11-24 ENCOUNTER — Other Ambulatory Visit: Payer: Self-pay

## 2020-11-24 ENCOUNTER — Ambulatory Visit: Payer: Managed Care, Other (non HMO)

## 2020-11-24 ENCOUNTER — Ambulatory Visit: Payer: Managed Care, Other (non HMO) | Admitting: Physical Therapy

## 2020-11-24 DIAGNOSIS — R482 Apraxia: Secondary | ICD-10-CM

## 2020-11-24 DIAGNOSIS — R2689 Other abnormalities of gait and mobility: Secondary | ICD-10-CM | POA: Diagnosis not present

## 2020-11-24 DIAGNOSIS — R4701 Aphasia: Secondary | ICD-10-CM

## 2020-11-24 DIAGNOSIS — R41841 Cognitive communication deficit: Secondary | ICD-10-CM

## 2020-11-24 NOTE — Therapy (Signed)
[facility] [address] [place], [place], [address] Phone: [phone]   Fax:  [phone]  Speech Language Pathology Treatment  Patient Details  Name: Ayako Tapanes Da Corrie Dandy MRN: [id number] Date of Birth: Nov 16, 1965 Referring Provider (SLP): Claudette Laws   Encounter Date: 11/24/2020   End of Session - 11/24/20 1444    Visit Number 9    Number of Visits 17    Date for SLP Re-Evaluation 01/17/21    Authorization Time Period 60 days combined    SLP Start Time 1445    SLP Stop Time  1530    SLP Time Calculation (min) 45 min    Activity Tolerance Patient tolerated treatment well           Past Medical History:  Diagnosis Date  . Allergy   . Anxiety   . Arthritis   . High triglycerides   . Kidney stones   . Migraines   . Recurrent sinus infections     Past Surgical History:  Procedure Laterality Date  . ANTERIOR CRUCIATE LIGAMENT REPAIR Left   . BUNIONECTOMY Right   . LASIK    . LITHOTRIPSY    . MENISCUS REPAIR Left   . NASAL SEPTUM SURGERY    . SHOULDER SURGERY    . VARICOSE VEIN SURGERY Left     There were no vitals filed for this visit.   Subjective Assessment - 11/24/20 1448    Subjective nodded head    Currently in Pain? No/denies                 ADULT SLP TREATMENT - 11/24/20 1443      General Information   Behavior/Cognition Alert;Cooperative;Pleasant mood;Impulsive      Cognitive-Linquistic Treatment   Treatment focused on Aphasia;Apraxia;Patient/family/caregiver education    Skilled Treatment SLP reviewed HEP, including words with consistent articulation /m/ and /t/. Without modeling, pt independently verbalized 5 out of 42 words with correct articulation. Given usual mod to max verbal and visual cues for intial sound and subsequent phonemes, pt able to able to correctly produce 20 additional words for 1-2 repetitions. Approximations of 2 or more phonemes noted for  14 words. Perservations noted during trials despite SLP modeling and cues. SLP trialed functional phrases of 3 words, with inconsistent responses noted during trials of same phrases despite SLP cues and modeling.     Assessment / Recommendations / Plan   Plan Continue with current plan of care      Progression Toward Goals   Progression toward goals Progressing toward goals            SLP Education - 11/24/20 2131    Education Details functional phrases, words with successful phonemes    Person(s) Educated Patient    Methods Explanation;Demonstration;Handout;Verbal cues    Comprehension Verbalized understanding;Returned demonstration;Verbal cues required;Need further instruction            SLP Short Term Goals - 11/21/20 1454      SLP SHORT TERM GOAL #1   Title Given SLP modeling, pt will simultaneously produce 3 different phonemes for word approximations with 50% accuracy over three sessions    Baseline 10-28-20, 11-04-20, 11-09-20    Period --   or 9 total visits   Status Achieved      SLP SHORT TERM GOAL #2   Title Pt will ID personally relevant objects in f:4 for communication of wants/needs with 75% accuracy with min A over 3 sessions  Baseline 11-04-20, 11-07-20    Status Partially Met      SLP SHORT TERM GOAL #3   Title Pt will write word or word approximations to aid communication for 3/5 opportunities with min A over 3 sessions.    Baseline 11-07-20, 11-09-20, 11-17-20    Status Achieved      SLP SHORT TERM GOAL #4   Title Pt will use mulitmodal communication (gesture, draw, write 1st letter etc) to augment verbal expression with usual min A over 3 sessions    Baseline 10-28-20 (alphabet board), 11-04-20 (draw), 11-07-20, 11-09-20    Status Achieved      SLP SHORT TERM GOAL #5   Title Pt will verbalize salient functional phrases with use of MIT with usual min A over 3 sessions    Status Not Met            SLP Long Term Goals - 11/24/20 1444      SLP LONG TERM GOAL #1    Title Pt will accurately produce phonemes to facilitate word approximations for 3 personally relevant words with min A over 2 sessions    Time 4    Period Weeks   or 17 total visits   Status On-going      SLP LONG TERM GOAL #2   Title Pt will ID personally relevant objects in f:8 for communication of wants/needs with 75% accuracy given rare min A over 3 sessions    Time 4    Period Weeks    Status On-going      SLP LONG TERM GOAL #3   Title Pt will use mulitmodal communication (gesture, draw, write 1st letter etc) to augment verbal expression to meet needs at home with rare min A from family.    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 SLP targeted speech production of words beginning with most consistent speech sounds /m/ and /t/. Pt able to produce 12% of targeted items (42 words) independently. With usual mod to max verbal and visual cues, accuracy improved to 60% accuracy. Approximations noted for 33% of responses. SLP trialed simple functional phrases, which was approximately ~50% accurate with max SLP modeling. SLP recommends skilled ST intervention to maximize current communication skills to increase communication effectiveness as well as target speech related tasks as pt desires to speak if possible.    Speech Therapy Frequency 2x / week    Duration 8 weeks   or 17 total visits   Treatment/Interventions Oral motor exercises;Compensatory strategies;Cueing hierarchy;Functional tasks;Patient/family education;Cognitive reorganization;Multimodal communcation approach;Language facilitation;Compensatory techniques;Internal/external aids;SLP  instruction and feedback    Potential to Achieve Goals Fair    Potential Considerations Previous level of function;Severity of impairments    SLP Home Exercise Plan provided    Consulted and Agree with Plan of Care Patient           Patient will benefit from skilled therapeutic intervention in order to improve the following deficits and impairments:   Verbal apraxia  Aphasia  Cognitive communication deficit  Problem List Patient Active Problem List   Diagnosis Date Noted  . 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 Nontraumatic subcortical hemorrhage of left cerebral hemisphere ([place]) 08/27/2019  . Acute blood loss anemia   . Hypoalbuminemia due to protein-calorie malnutrition ([place])   . Thrombocytopenia ([place])   . Dysphagia 07/29/2019  . 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 Hypernatremia   . Leukocytosis   . Essential hypertension   . Global aphasia   . Cytotoxic brain edema ([place]) 07/10/2019  . ICH (intracerebral hemorrhage) ([place]) 07/09/2019  . Anxiety state 02/21/2015  . Cephalalgia 12/21/2014    Alinda Deem, MA CCC-SLP 11/24/2020, 9:49 PM  [facility] [address] [place], [place], [address] Phone: [phone]   Fax:  [phone]   Name: Kalene Cutler Da Glennie Hawk MRN: [id number] Date of Birth: 09-19-1965

## 2020-11-28 ENCOUNTER — Other Ambulatory Visit: Payer: Self-pay

## 2020-11-28 ENCOUNTER — Ambulatory Visit: Payer: Managed Care, Other (non HMO) | Attending: Family Medicine | Admitting: Physical Therapy

## 2020-11-28 ENCOUNTER — Ambulatory Visit: Payer: Managed Care, Other (non HMO)

## 2020-11-28 DIAGNOSIS — M6281 Muscle weakness (generalized): Secondary | ICD-10-CM | POA: Diagnosis present

## 2020-11-28 DIAGNOSIS — R2689 Other abnormalities of gait and mobility: Secondary | ICD-10-CM | POA: Diagnosis present

## 2020-11-28 DIAGNOSIS — R482 Apraxia: Secondary | ICD-10-CM | POA: Insufficient documentation

## 2020-11-28 DIAGNOSIS — I69153 Hemiplegia and hemiparesis following nontraumatic intracerebral hemorrhage affecting right non-dominant side: Secondary | ICD-10-CM | POA: Diagnosis present

## 2020-11-28 DIAGNOSIS — R4701 Aphasia: Secondary | ICD-10-CM | POA: Diagnosis present

## 2020-11-28 NOTE — Therapy (Signed)
[facility] [address] [place], [place], [address] Phone: [phone]   Fax:  [phone]  Physical Therapy Treatment  Patient Details  Name: Melanie Madden MRN: [id number] Date of Birth: [birth year] Referring Provider (PT): Alysia Penna   Encounter Date: 11/28/2020   PT End of Session - 11/28/20 1720    Visit Number 7    Number of Visits 17    Date for PT Re-Evaluation 12/18/20    Authorization Type [employer]; $50 copay per day; VL: 60 days combined    PT Start Time 1173    PT Stop Time 1319    PT Time Calculation (min) 44 min    Equipment Utilized During Treatment Other (comment)   body weight support system   Activity Tolerance Patient tolerated treatment well    Behavior During Therapy [facility] for tasks assessed/performed           Past Medical History:  Diagnosis Date  . Allergy   . Anxiety   . Arthritis   . High triglycerides   . Kidney stones   . Migraines   . Recurrent sinus infections     Past Surgical History:  Procedure Laterality Date  . ANTERIOR CRUCIATE LIGAMENT REPAIR Left   . BUNIONECTOMY Right   . LASIK    . LITHOTRIPSY    . MENISCUS REPAIR Left   . NASAL SEPTUM SURGERY    . SHOULDER SURGERY    . VARICOSE VEIN SURGERY Left     There were no vitals filed for this visit.   Subjective Assessment - 11/28/20 1239    Subjective Pt non verbal but uses non verbal communication appropriately.  Nothing new to report.  Brought in tablet to use in Speech therapy.    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    Currently in Pain? No/denies                             [facility] Adult PT Treatment/Exercise - 11/28/20 1246      Ambulation/Gait   Gait Comments RLE fatigue after treadmill;  one episode of R toe catching.  Pt able to regain balance but required therapist's assistance to initiate swing on RLE      Exercises   Exercises Other Exercises;Knee/Hip    Other Exercises  Supine exercises to focus on RLE extension activation and anterior pelvic rotation: 1 set x 10 reps single RLE bridge, 10 reps single leg bridge with pelvic rotation to the L.  Bilat bridge with RLE lift off mat during bridge, 2 sets x 10 reps with cues for hip flexion activation during knee flexion.      Knee/Hip Exercises: Aerobic   Tread Mill 0.5 mph x 8 minutes + 4 minutes with BWS with 30# body weight lifted.  Therapist provided facilitation down at RLE to ensure full foot clearance and heel strike continuously as well as provide verbal cues for anterior pelvic rotation, to increase R stance phase and to initiate hip and knee flexion after hip extension.  Pt demonstrated intermittent knee buckling during stance if pt kept pelvis rotated posteriorly.  PT Short Term Goals - 11/21/20 1234      PT SHORT TERM GOAL #1   Title Pt will decrease 5 x sit to stand from 13.63 sec to <11 sec for improved balance and functional strength.    Baseline 10/19/20 13.63 sec from chair with hand. 11/21/20 16.45 sec from chair with hand    Time 4    Period Weeks    Status Not Met    Target Date 11/18/20      PT SHORT TERM GOAL #2   Title Pt will ambulate >500' on level surfaces with [facility] supervision for improved household mobility.    Baseline 11/21/20 575' with SBQC and AFO supervision    Time 4    Period Weeks    Status Achieved    Target Date 11/18/20             PT Long Term Goals - 10/19/20 1914      PT LONG TERM GOAL #1   Title Pt will be able to perform progressive HEP for strengthening, balance and mobility with assist of husband to continued gains at home.    Time 8    Period Weeks    Status New    Target Date 12/18/20      PT LONG TERM GOAL #2   Title Pt will increase  Berg Balance from 31 to >34/56 for improved balance and decreased fall risk.    Baseline 10/19/20 31/56    Time 8    Period Weeks    Status New    Target Date 12/18/20      PT LONG TERM GOAL #3   Title Pt will ambulate > 600' on level outside paved surfaces and ramps with [facility] supervision for improved short community distances.    Time 8    Period Weeks    Status New    Target Date 12/18/20      PT LONG TERM GOAL #4   Title Pt will increase giat speed from 0.63m/s to >0.4 m/s for improved household mobility.    Baseline 10/19/20 0.44m/s    Time 8    Period Weeks    Status New    Target Date 12/18/20                 Plan - 11/28/20 1722    Clinical Impression Statement Returned to use of BWS and treadmill training; pt required slower treadmill speed today but was able to follow therapist's commands and demonstrate improved activation of hip and knee flexion with anterior pelvic rotation to initiate swing phase intermittently, not able to perform consistently.  Attempted to focus on increased stance time and hip extension in stance but unable to facilitate or cue effectively.  Will continue to address.    Personal Factors and Comorbidities Time since onset of injury/illness/exacerbation;Comorbidity 3+;Behavior Pattern    Comorbidities anxiety, migraines, HTN    Examination-Activity Limitations Transfers;Stairs;Locomotion Level;Stand    Examination-Participation Restrictions Community Activity;Cleaning    Stability/Clinical Decision Making Evolving/Moderate complexity    Rehab Potential Good    PT Frequency 2x / week    PT Duration 8 weeks    PT Treatment/Interventions ADLs/Self Care Home Management;DME Instruction;Therapeutic activities;Functional mobility training;Stair training;Gait training;Therapeutic exercise;Balance training;Neuromuscular re-education;Patient/family education;Manual techniques;Passive range of motion;Vestibular    PT Next Visit Plan BWS over treadmill with  ~30# unweighted. Performed at 0.38mph last visit with medium harness. Continue to work on right anterior pelvic rotation as tends to be posteriorlly rotated to help initiate  more hip flexion. Work on weight shifting to right in standing, functional strengthening, gait training with SBQC.    Consulted and Agree with Plan of Care Patient           Patient will benefit from skilled therapeutic intervention in order to improve the following deficits and impairments:  Abnormal gait,Decreased safety awareness,Decreased range of motion,Decreased mobility,Impaired sensation,Decreased strength,Decreased balance  Visit Diagnosis: Other abnormalities of gait and mobility  Muscle weakness (generalized)     Problem List Patient Active Problem List   Diagnosis Date Noted  . 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 Nontraumatic subcortical hemorrhage of left cerebral hemisphere ([place]) 08/27/2019  . Acute blood loss anemia   . Hypoalbuminemia due to protein-calorie malnutrition ([place])   . Thrombocytopenia ([place])   . Dysphagia 07/29/2019  . Infarction of left basal ganglia ([place]) 07/18/2019  . Hypernatremia   . Leukocytosis   . Essential hypertension   . Global aphasia   . Cytotoxic brain edema ([place]) 07/10/2019  . ICH (intracerebral hemorrhage) ([place]) 07/09/2019  . Anxiety state 02/21/2015  . Cephalalgia 12/21/2014   Rico Junker, PT, DPT 11/28/20    5:31 PM    [facility] [address] [place], [place], [address] Phone: [phone]   Fax:  [phone]  Name: Melanie Madden MRN: [id number] Date of Birth: 1966-07-19

## 2020-11-28 NOTE — Therapy (Signed)
[facility] [address] [place], [place], [address] Phone: [phone]   Fax:  [phone]  Speech Language Pathology Treatment  Patient Details  Name: Melanie Madden MRN: [id number] Date of Birth: 1966-06-07 Referring Provider (SLP): Alysia Penna   Encounter Date: 11/28/2020   End of Session - 11/28/20 1458    Visit Number 10    Number of Visits 17    Date for SLP Re-Evaluation 01/17/21    Authorization Time Period 60 days combined    SLP Start Time 1319    SLP Stop Time  1400    SLP Time Calculation (min) 41 min    Activity Tolerance Patient tolerated treatment well           Past Medical History:  Diagnosis Date  . Allergy   . Anxiety   . Arthritis   . High triglycerides   . Kidney stones   . Migraines   . Recurrent sinus infections     Past Surgical History:  Procedure Laterality Date  . ANTERIOR CRUCIATE LIGAMENT REPAIR Left   . BUNIONECTOMY Right   . LASIK    . LITHOTRIPSY    . MENISCUS REPAIR Left   . NASAL SEPTUM SURGERY    . SHOULDER SURGERY    . VARICOSE VEIN SURGERY Left     There were no vitals filed for this visit.   Subjective Assessment - 11/28/20 1320    Subjective nodded head    Currently in Pain? No/denies                 ADULT SLP TREATMENT - 11/28/20 1318      General Information   Behavior/Cognition Alert;Cooperative;Pleasant mood;Impulsive      Cognitive-Linquistic Treatment   Treatment focused on Aphasia;Apraxia;Patient/family/caregiver education    Skilled Treatment SLP reviewed HEP for ID opposites and words beginning with /w/ and /s/. Pt able to read word and targeted opposite with 8% accuracy independently. Given consistent SLP modeling and moderate to max verbal and visual cues, accuracy of words increased 46%. Pt had 6 approximations when cued. SLP reviewed targeted sounds /w/ and /s/ words, in which pt able to produce 5/42 words  independently. With mod to max verbal and visual cues, pt able to produce words with 45% accuracy. Pt continues to exhibit reduced consistency despite same initial sounds and has significant difficulty with words ending in bilaterals or velars.      Assessment / Recommendations / Plan   Plan Continue with current plan of care      Progression Toward Goals   Progression toward goals Progressing toward goals            SLP Education - 11/28/20 1458    Education Details words with more successful phonemes, biofeedback    Person(s) Educated Patient    Methods Explanation;Demonstration;Handout;Verbal cues    Comprehension Verbalized understanding;Returned demonstration;Verbal cues required;Need further instruction            SLP Short Term Goals - 11/21/20 1454      SLP SHORT TERM GOAL #1   Title Given SLP modeling, pt will simultaneously produce 3 different phonemes for word approximations with 50% accuracy over three sessions    Baseline 10-28-20, 11-04-20, 11-09-20    Period --   or 9 total visits   Status Achieved      SLP SHORT TERM GOAL #2   Title Pt will ID personally relevant objects in f:4 for communication of wants/needs  with 75% accuracy with min A over 3 sessions    Baseline 11-04-20, 11-07-20    Status Partially Met      SLP SHORT TERM GOAL #3   Title Pt will write word or word approximations to aid communication for 3/5 opportunities with min A over 3 sessions.    Baseline 11-07-20, 11-09-20, 11-17-20    Status Achieved      SLP SHORT TERM GOAL #4   Title Pt will use mulitmodal communication (gesture, draw, write 1st letter etc) to augment verbal expression with usual min A over 3 sessions    Baseline 10-28-20 (alphabet board), 11-04-20 (draw), 11-07-20, 11-09-20    Status Achieved      SLP SHORT TERM GOAL #5   Title Pt will verbalize salient functional phrases with use of MIT with usual min A over 3 sessions    Status Not Met            SLP Long Term Goals - 11/28/20  1319      SLP LONG TERM GOAL #1   Title Pt will accurately produce phonemes to facilitate word approximations for 3 personally relevant words with min A over 2 sessions    Time 3    Period Weeks   or 17 total visits   Status On-going      SLP LONG TERM GOAL #2   Title Pt will ID personally relevant objects in f:8 for communication of wants/needs with 75% accuracy given rare min A over 3 sessions    Time 3    Period Weeks    Status On-going      SLP LONG TERM GOAL #3   Title Pt will use mulitmodal communication (gesture, draw, write 1st letter etc) to augment verbal expression to meet needs at home with rare min A from family.    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 SLP targeted speech production of words beginning with more consistent speech sounds /w/ and /s/. Pt able to produce 12% of targeted items (5/42 words) independently. With usual mod to max verbal and visual cues, accuracy improved to 45% accuracy. Approximations noted intermittently. SLP trialed simple phrases, in which pt was 46% accurate given consistent SLP modeling and cues. SLP recommends skilled ST intervention to maximize current communication skills to increase communication effectiveness as well as target speech related tasks as pt desires to speak if possible.    Speech Therapy Frequency 2x / week    Duration 8 weeks   or 17 total visits   Treatment/Interventions Oral motor exercises;Compensatory strategies;Cueing hierarchy;Functional tasks;Patient/family education;Cognitive reorganization;Multimodal communcation approach;Language facilitation;Compensatory  techniques;Internal/external aids;SLP instruction and feedback    Potential to Achieve Goals Fair    Potential Considerations Previous level of function;Severity of impairments    SLP Home Exercise Plan provided    Consulted and Agree with Plan of Care Patient           Patient will benefit from skilled therapeutic intervention in order to improve the following deficits and  impairments:   Verbal apraxia  Aphasia    Problem List Patient Active Problem List   Diagnosis Date Noted  . 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 Nontraumatic subcortical hemorrhage of left cerebral hemisphere ([place]) 08/27/2019  . Acute blood loss anemia   . Hypoalbuminemia due to protein-calorie malnutrition ([place])   . Thrombocytopenia ([place])   . Dysphagia 07/29/2019  . Infarction of left basal ganglia ([place]) 07/18/2019  . Hypernatremia   . Leukocytosis   . Essential hypertension   . Global aphasia   . Cytotoxic brain edema ([place]) 07/10/2019  . ICH (intracerebral hemorrhage) ([place]) 07/09/2019  . Anxiety state 02/21/2015  . Cephalalgia 12/21/2014    Alinda Deem, MA CCC-SLP 11/28/2020, 3:04 PM  [facility] [address] [place], [place], [address] Phone: [phone]   Fax:  [phone]   Name: Melanie Madden MRN: [id number] Date of Birth: [birth year]

## 2020-11-30 ENCOUNTER — Ambulatory Visit: Payer: Managed Care, Other (non HMO)

## 2020-11-30 ENCOUNTER — Other Ambulatory Visit: Payer: Self-pay

## 2020-11-30 DIAGNOSIS — I69153 Hemiplegia and hemiparesis following nontraumatic intracerebral hemorrhage affecting right non-dominant side: Secondary | ICD-10-CM

## 2020-11-30 DIAGNOSIS — R2689 Other abnormalities of gait and mobility: Secondary | ICD-10-CM

## 2020-11-30 DIAGNOSIS — M6281 Muscle weakness (generalized): Secondary | ICD-10-CM

## 2020-11-30 DIAGNOSIS — R4701 Aphasia: Secondary | ICD-10-CM

## 2020-11-30 DIAGNOSIS — R482 Apraxia: Secondary | ICD-10-CM

## 2020-11-30 NOTE — Therapy (Signed)
[facility] [address] [place], [place], [address] Phone: [phone]   Fax:  [phone]  Speech Language Pathology Treatment  Patient Details  Name: Melanie Madden Da Glennie Hawk MRN: [id number] Date of Birth: 1966/04/12 Referring Provider (SLP): Alysia Penna   Encounter Date: 11/30/2020   End of Session - 11/30/20 1339    Visit Number 11    Number of Visits 17    Date for SLP Re-Evaluation 01/17/21    Authorization Time Period 60 days combined    SLP Start Time 1315    SLP Stop Time  1400    SLP Time Calculation (min) 45 min    Activity Tolerance Patient tolerated treatment well;Other (comment)   emotional lability          Past Medical History:  Diagnosis Date  . Allergy   . Anxiety   . Arthritis   . High triglycerides   . Kidney stones   . Migraines   . Recurrent sinus infections     Past Surgical History:  Procedure Laterality Date  . ANTERIOR CRUCIATE LIGAMENT REPAIR Left   . BUNIONECTOMY Right   . LASIK    . LITHOTRIPSY    . MENISCUS REPAIR Left   . NASAL SEPTUM SURGERY    . SHOULDER SURGERY    . VARICOSE VEIN SURGERY Left     There were no vitals filed for this visit.   Subjective Assessment - 11/30/20 1320    Subjective nodded head    Currently in Pain? No/denies                 ADULT SLP TREATMENT - 11/30/20 1320      General Information   Behavior/Cognition Alert;Cooperative;Pleasant mood;Impulsive      Cognitive-Linquistic Treatment   Treatment focused on Aphasia;Apraxia;Patient/family/caregiver education    Skilled Treatment SLP reviewed HEP for words beginning with /b/ and /p/.  Without intial modeling, pt able to to produce 3/41 words independently. With SLP modeling and usual mod to max verbal and visual cues, pt able to produce words with 66% accuracy. Pt continues to exhibit reduced consistency despite same intial sounds well as words ending in bilaterals or  velars. SLP trialed simple functional phrases with max SLP modeling required. Of note, pt became intermittently tearful throughout session related to her performance. SLP provided education re: impact of apraxia and oral motor coordination on her ability to consistently and effectively communicate verbally. SLP recommended use of gestures, writing, drawing, and communication board to supplement verbal output. Pt used gesture x1 in reference to limited number of ST sessions left. SLP discussed possibilities regarding ST discharge.      Assessment / Recommendations / Plan   Plan Continue with current plan of care      Progression Toward Goals   Progression toward goals Progressing toward goals            SLP Education - 11/30/20 1502    Education Details cues, modeling, written feedback of minimal pairs    Person(s) Educated Patient    Methods Explanation;Demonstration;Verbal cues;Handout    Comprehension Verbalized understanding;Returned demonstration;Verbal cues required;Need further instruction            SLP Short Term Goals - 11/21/20 1454      SLP SHORT TERM GOAL #1   Title Given SLP modeling, pt will simultaneously produce 3 different phonemes for word approximations with 50% accuracy over three sessions    Baseline 10-28-20, 11-04-20, 11-09-20  Period --   or 9 total visits   Status Achieved      SLP SHORT TERM GOAL #2   Title Pt will ID personally relevant objects in f:4 for communication of wants/needs with 75% accuracy with min A over 3 sessions    Baseline 11-04-20, 11-07-20    Status Partially Met      SLP SHORT TERM GOAL #3   Title Pt will write word or word approximations to aid communication for 3/5 opportunities with min A over 3 sessions.    Baseline 11-07-20, 11-09-20, 11-17-20    Status Achieved      SLP SHORT TERM GOAL #4   Title Pt will use mulitmodal communication (gesture, draw, write 1st letter etc) to augment verbal expression with usual min A over 3 sessions     Baseline 10-28-20 (alphabet board), 11-04-20 (draw), 11-07-20, 11-09-20    Status Achieved      SLP SHORT TERM GOAL #5   Title Pt will verbalize salient functional phrases with use of MIT with usual min A over 3 sessions    Status Not Met            SLP Long Term Goals - 11/30/20 1503      SLP LONG TERM GOAL #1   Title Pt will accurately produce phonemes to facilitate word approximations for 3 personally relevant words with min A over 2 sessions    Time 3    Period Weeks   or 17 total visits   Status On-going      SLP LONG TERM GOAL #2   Title Pt will ID personally relevant objects in f:8 for communication of wants/needs with 75% accuracy given rare min A over 3 sessions    Time 3    Period Weeks    Status On-going      SLP LONG TERM GOAL #3   Title Pt will use mulitmodal communication (gesture, draw, write 1st letter etc) to augment verbal expression to meet needs at home with rare min A from family.    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 SLP targeted speech production of words beginning with more consistent speech sounds /p/ and /b/. Pt able to produce 7% of targeted items (3/42 words) independently. With SLP modeling and usual mod to max verbal and visual cues, accuracy improved to 68% accuracy. Approximations noted intermittently. Tearfulness exhibited intermittently this session due to frustration related to perfromance SLP trialed simple phrases, in which pt was 46% accurate given consistent SLP modeling anc cues. SLP recommends skilled ST intervention to maximize current communication skills to increase communication effectiveness  as well as target speech related tasks as pt desires to speak if possible.    Speech Therapy Frequency 2x / week    Duration 8 weeks   or 17 total visits   Treatment/Interventions Oral motor exercises;Compensatory strategies;Cueing hierarchy;Functional tasks;Patient/family education;Cognitive reorganization;Multimodal communcation approach;Language facilitation;Compensatory techniques;Internal/external aids;SLP instruction and feedback    Potential to Achieve Goals Fair    Potential  Considerations Previous level of function;Severity of impairments    SLP Home Exercise Plan provided    Consulted and Agree with Plan of Care Patient           Patient will benefit from skilled therapeutic intervention in order to improve the following deficits and impairments:   Verbal apraxia  Aphasia    Problem List Patient Active Problem List   Diagnosis Date Noted  . 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 Nontraumatic subcortical hemorrhage of left cerebral hemisphere ([place]) 08/27/2019  . Acute blood loss anemia   . Hypoalbuminemia due to protein-calorie malnutrition ([place])   . Thrombocytopenia ([place])   . Dysphagia 07/29/2019  . Infarction of left basal ganglia ([place]) 07/18/2019  . Hypernatremia   . Leukocytosis   . Essential hypertension   . Global aphasia   . Cytotoxic brain edema ([place]) 07/10/2019  . ICH (intracerebral hemorrhage) ([place]) 07/09/2019  . Anxiety state 02/21/2015  . Cephalalgia 12/21/2014    Alinda Deem, MA CCC-SLP 11/30/2020, 3:08 PM  [facility] [address] [place], [place], [address] Phone: [phone]   Fax:  [phone]   Name: Jonia Oakey Da Glennie Hawk MRN: [id number] Date of Birth: [birth year]

## 2020-11-30 NOTE — Therapy (Signed)
[facility] [address] [place], [place], [address] Phone: [phone]   Fax:  [phone]  Physical Therapy Treatment  Patient Details  Name: Melanie Madden Da Glennie Hawk MRN: [id number] Date of Birth: January 21, 1966 Referring Provider (PT): Alysia Penna   Encounter Date: 11/30/2020   PT End of Session - 11/30/20 1403    Visit Number 8    Number of Visits 17    Date for PT Re-Evaluation 12/18/20    Authorization Type [employer]; $50 copay per day; VL: 60 days combined    PT Start Time 1401    PT Stop Time 1443    PT Time Calculation (min) 42 min    Equipment Utilized During Treatment Other (comment)   body weight support system   Activity Tolerance Patient tolerated treatment well    Behavior During Therapy [facility] for tasks assessed/performed           Past Medical History:  Diagnosis Date  . Allergy   . Anxiety   . Arthritis   . High triglycerides   . Kidney stones   . Migraines   . Recurrent sinus infections     Past Surgical History:  Procedure Laterality Date  . ANTERIOR CRUCIATE LIGAMENT REPAIR Left   . BUNIONECTOMY Right   . LASIK    . LITHOTRIPSY    . MENISCUS REPAIR Left   . NASAL SEPTUM SURGERY    . SHOULDER SURGERY    . VARICOSE VEIN SURGERY Left     There were no vitals filed for this visit.   Subjective Assessment - 11/30/20 1403    Subjective Pt non verbal but uses non verbal communication appropriately.  Nothing new to report.  Just finished ST.    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    Currently in Pain? No/denies                             [facility] Adult PT Treatment/Exercise - 11/30/20 1404      Ambulation/Gait   Ambulation/Gait Yes    Ambulation/Gait Assistance 5: Supervision     Ambulation/Gait Assistance Details around in clinic between activities    Assistive device Small based quad cane   right AFO   Gait Pattern Step-through pattern;Decreased step length - right;Decreased stance time - right;Decreased hip/knee flexion - right;Right circumduction    Ambulation Surface Level;Indoor    Gait Comments BWS over treadmill 5 min x 2 just to readjust hand with 30# unweighted. 0.85mph first bout and 0.6pmh second bout. PT assisting at RLE to increase hip/knee flexion and decrease circumduction. Verbal cues to try to get more pelvic rotation on right and to increase stance time on right riding leg back to slow down left step. Utilized right hand grip strap and kerlex to keep hand on bar.      Neuro Re-ed    Neuro Re-ed Details  Sit to stand x 10 from mat with LLE staggered slightly in front and PT helping to facilitate right weight shift. Tall kneeling: working on tryiing to get up tall with equal weight bearing, weight shifting to right bumping PT hip x 5, bringing left leg forward and back x 10 to increase right weight shift CGA and  verbal cues for erect posture, and then raising left hand x 10. PT removed AFO during tall kneeling activities.                    PT Short Term Goals - 11/21/20 1234      PT SHORT TERM GOAL #1   Title Pt will decrease 5 x sit to stand from 13.63 sec to <11 sec for improved balance and functional strength.    Baseline 10/19/20 13.63 sec from chair with hand. 11/21/20 16.45 sec from chair with hand    Time 4    Period Weeks    Status Not Met    Target Date 11/18/20      PT SHORT TERM GOAL #2   Title Pt will ambulate >500' on level surfaces with [facility] supervision for improved household mobility.    Baseline 11/21/20 575' with SBQC and AFO supervision    Time 4    Period Weeks    Status Achieved    Target Date 11/18/20             PT Long Term Goals - 10/19/20 1914      PT LONG TERM GOAL #1   Title Pt will be able to perform  progressive HEP for strengthening, balance and mobility with assist of husband to continued gains at home.    Time 8    Period Weeks    Status New    Target Date 12/18/20      PT LONG TERM GOAL #2   Title Pt will increase Berg Balance from 31 to >34/56 for improved balance and decreased fall risk.    Baseline 10/19/20 31/56    Time 8    Period Weeks    Status New    Target Date 12/18/20      PT LONG TERM GOAL #3   Title Pt will ambulate > 600' on level outside paved surfaces and ramps with [facility] supervision for improved short community distances.    Time 8    Period Weeks    Status New    Target Date 12/18/20      PT LONG TERM GOAL #4   Title Pt will increase giat speed from 0.78m/s to >0.4 m/s for improved household mobility.    Baseline 10/19/20 0.65m/s    Time 8    Period Weeks    Status New    Target Date 12/18/20                 Plan - 11/30/20 1927    Clinical Impression Statement Pt was able to initiate more right hip flexion with gait in BWS today. Was unsure about tall kneeling but did well. Does need tactile cues for erect posture and to weight shift to the right.    Personal Factors and Comorbidities Time since onset of injury/illness/exacerbation;Comorbidity 3+;Behavior Pattern    Comorbidities anxiety, migraines, HTN    Examination-Activity Limitations Transfers;Stairs;Locomotion Level;Stand    Examination-Participation Restrictions Community Activity;Cleaning    Stability/Clinical Decision Making Evolving/Moderate complexity    Rehab Potential Good    PT Frequency 2x / week    PT Duration 8 weeks    PT Treatment/Interventions ADLs/Self Care Home Management;DME Instruction;Therapeutic activities;Functional mobility training;Stair training;Gait training;Therapeutic exercise;Balance training;Neuromuscular re-education;Patient/family education;Manual techniques;Passive range of motion;Vestibular    PT Next Visit Plan BWS over treadmill with ~30# unweighted.  Performed at 0.28mph last visit with medium harness. Continue to work on right anterior pelvic rotation as tends to be  posteriorlly rotated to help initiate more hip flexion. Work on weight shifting to right in standing, functional strengthening, gait training with SBQC. Continue tall kneeling activities.    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 Nontraumatic subcortical hemorrhage of left cerebral hemisphere ([place]) 08/27/2019  . Acute blood loss anemia   . Hypoalbuminemia due to protein-calorie malnutrition ([place])   . Thrombocytopenia ([place])   . Dysphagia 07/29/2019  . Infarction of left basal ganglia ([place]) 07/18/2019  . Hypernatremia   . Leukocytosis   . Essential hypertension   . Global aphasia   . Cytotoxic brain edema ([place]) 07/10/2019  . ICH (intracerebral hemorrhage) ([place]) 07/09/2019  . Anxiety state 02/21/2015  . Cephalalgia 12/21/2014    Electa Sniff, PT, DPT, NCS 11/30/2020, 7:30 PM  [facility] [address] [place], [place], [address] Phone: [phone]   Fax:  [phone]  Name: Sherelle Castelli Da Glennie Hawk MRN: [id number] Date of Birth: [birth year]

## 2020-12-06 ENCOUNTER — Other Ambulatory Visit: Payer: Self-pay

## 2020-12-06 ENCOUNTER — Ambulatory Visit: Payer: Managed Care, Other (non HMO)

## 2020-12-06 DIAGNOSIS — N201 Calculus of ureter: Secondary | ICD-10-CM | POA: Diagnosis not present

## 2020-12-06 DIAGNOSIS — M6281 Muscle weakness (generalized): Secondary | ICD-10-CM

## 2020-12-06 DIAGNOSIS — A4151 Sepsis due to Escherichia coli [E. coli]: Secondary | ICD-10-CM | POA: Diagnosis not present

## 2020-12-06 DIAGNOSIS — R2689 Other abnormalities of gait and mobility: Secondary | ICD-10-CM

## 2020-12-06 DIAGNOSIS — R4701 Aphasia: Secondary | ICD-10-CM

## 2020-12-06 DIAGNOSIS — R482 Apraxia: Secondary | ICD-10-CM

## 2020-12-06 NOTE — Therapy (Signed)
[facility] [address] [place], [place], [address] Phone: [phone]   Fax:  [phone]  Physical Therapy Treatment  Patient Details  Name: Melanie Madden MRN: [id number] Date of Birth: July 08, 1966 Referring Provider (PT): Alysia Penna   Encounter Date: 12/06/2020   PT End of Session - 12/06/20 1405    Visit Number 9    Number of Visits 17    Date for PT Re-Evaluation 12/18/20    Authorization Type [employer]; $50 copay per day; VL: 60 days combined    PT Start Time 1401    PT Stop Time 1450    PT Time Calculation (min) 49 min    Equipment Utilized During Treatment --    Activity Tolerance Patient tolerated treatment well    Behavior During Therapy [facility] for tasks assessed/performed           Past Medical History:  Diagnosis Date  . Allergy   . Anxiety   . Arthritis   . High triglycerides   . Kidney stones   . Migraines   . Recurrent sinus infections     Past Surgical History:  Procedure Laterality Date  . ANTERIOR CRUCIATE LIGAMENT REPAIR Left   . BUNIONECTOMY Right   . LASIK    . LITHOTRIPSY    . MENISCUS REPAIR Left   . NASAL SEPTUM SURGERY    . SHOULDER SURGERY    . VARICOSE VEIN SURGERY Left     There were no vitals filed for this visit.   Subjective Assessment - 12/06/20 1405    Subjective Pt non verbal but uses non verbal communication appropriately.  Nothing new to report.  Just finished ST.    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    Currently in Pain? No/denies                             [facility] Adult PT Treatment/Exercise - 12/06/20 1406      Ambulation/Gait   Ambulation/Gait Yes    Ambulation/Gait Assistance 5: Supervision;4: Min guard    Ambulation/Gait Assistance  Details Pt ambulated 35 minutes outside. Pt was supervision throughout most of gait other than when ambulated short distance over thick grass needing CGA and min assist in one place to clear right foot back to sidewalk over edge of grass. In grass pt had very step-to pattern with decreased steps and significantly less right weight shift with wide BOS. On inclines up small ramps and down pt also used more stept- pattern. Had one time on sidewalk where caught front of foot but able to correct on own. Verbal cues to try to pick up from her hip and bring right pelvis forward. BP=144/82 after gait.    Ambulation Distance (Feet) 850 Feet    Assistive device Small based quad cane   right AFO   Gait Pattern Step-through pattern;Step-to pattern;Decreased step length - right;Decreased step length - left;Decreased hip/knee flexion - right;Right genu recurvatum    Ambulation Surface Level;Unlevel;Indoor;Outdoor;Paved;Grass    Ramp 5: Supervision    Ramp Details (indicate cue type and reason) with SBQC and decreased step length with step to pattern.    Curb 5: Supervision    Curb Details (indicate cue type  and reason) up/down curb with SBQC close SBA      Therapeutic Activites    Therapeutic Activities Other Therapeutic Activities    Other Therapeutic Activities Pt was able to write some words on paper for PT to answer some questions today to help communicate.                    PT Short Term Goals - 11/21/20 1234      PT SHORT TERM GOAL #1   Title Pt will decrease 5 x sit to stand from 13.63 sec to <11 sec for improved balance and functional strength.    Baseline 10/19/20 13.63 sec from chair with hand. 11/21/20 16.45 sec from chair with hand    Time 4    Period Weeks    Status Not Met    Target Date 11/18/20      PT SHORT TERM GOAL #2   Title Pt will ambulate >500' on level surfaces with [facility] supervision for improved household mobility.    Baseline 11/21/20 575' with SBQC and AFO supervision     Time 4    Period Weeks    Status Achieved    Target Date 11/18/20             PT Long Term Goals - 10/19/20 1914      PT LONG TERM GOAL #1   Title Pt will be able to perform progressive HEP for strengthening, balance and mobility with assist of husband to continued gains at home.    Time 8    Period Weeks    Status New    Target Date 12/18/20      PT LONG TERM GOAL #2   Title Pt will increase Berg Balance from 31 to >34/56 for improved balance and decreased fall risk.    Baseline 10/19/20 31/56    Time 8    Period Weeks    Status New    Target Date 12/18/20      PT LONG TERM GOAL #3   Title Pt will ambulate > 600' on level outside paved surfaces and ramps with [facility] supervision for improved short community distances.    Time 8    Period Weeks    Status New    Target Date 12/18/20      PT LONG TERM GOAL #4   Title Pt will increase giat speed from 0.33ms to >0.4 m/s for improved household mobility.    Baseline 10/19/20 0.319m    Time 8    Period Weeks    Status New    Target Date 12/18/20                 Plan - 12/06/20 1510    Clinical Impression Statement Pt was able to increase gait distance and time walked outside today completing 35 minutes straight of gait. Pt was supervision overall, just needing CGA on grass which was more challenging.    Personal Factors and Comorbidities Time since onset of injury/illness/exacerbation;Comorbidity 3+;Behavior Pattern    Comorbidities anxiety, migraines, HTN    Examination-Activity Limitations Transfers;Stairs;Locomotion Level;Stand    Examination-Participation Restrictions Community Activity;Cleaning    Stability/Clinical Decision Making Evolving/Moderate complexity    Rehab Potential Good    PT Frequency 2x / week    PT Duration 8 weeks    PT Treatment/Interventions ADLs/Self Care Home Management;DME Instruction;Therapeutic activities;Functional mobility training;Stair training;Gait training;Therapeutic  exercise;Balance training;Neuromuscular re-education;Patient/family education;Manual techniques;Passive range of motion;Vestibular    PT Next Visit Plan Did the  additional week get added past next week? This was week 6 in cert. BWS over treadmill with ~30# unweighted. Performed at 0.25mh last visit with medium harness. Continue to work on right anterior pelvic rotation as tends to be posteriorlly rotated to help initiate more hip flexion. Work on weight shifting to right in standing, functional strengthening, gait training with SBQC. Continue tall kneeling activities.    Consulted and Agree with Plan of Care Patient           Patient will benefit from skilled therapeutic intervention in order to improve the following deficits and impairments:  Abnormal gait,Decreased safety awareness,Decreased range of motion,Decreased mobility,Impaired sensation,Decreased strength,Decreased balance  Visit Diagnosis: Other abnormalities of gait and mobility  Muscle weakness (generalized)     Problem List Patient Active Problem List   Diagnosis Date Noted  . 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 Nontraumatic subcortical hemorrhage of left cerebral hemisphere ([place] 08/27/2019  . Acute blood loss anemia   . Hypoalbuminemia due to protein-calorie malnutrition ([place]   . Thrombocytopenia ([place]   . Dysphagia 07/29/2019  . Infarction of left basal ganglia ([place] 07/18/2019  . Hypernatremia   . Leukocytosis   . Essential hypertension   . Global aphasia   . Cytotoxic brain edema ([place] 07/10/2019  . I[place] (intracerebral hemorrhage) ([place] 07/09/2019  . Anxiety state 02/21/2015  . Cephalalgia 12/21/2014    EElecta Sniff PT, DPT, NCS 12/06/2020, 3:12 PM  [place]SW[address] [place] [id number]: [phone]  Fax:  [phone] Name: NShamanda LenDa SGlennie HawkMRN:  [id number] of Birth: 1Jun 08, 1967

## 2020-12-06 NOTE — Therapy (Signed)
[facility] [address] [place], [place], [address] Phone: [phone]   Fax:  [phone]  Speech Language Pathology Treatment  Patient Details  Name: Melanie Madden Da Glennie Hawk MRN: [id number] Date of Birth: March 23, 1966 Referring Provider (SLP): Alysia Penna   Encounter Date: 12/06/2020   End of Session - 12/06/20 1313    Visit Number 12    Number of Visits 17    Date for SLP Re-Evaluation 01/17/21    Authorization Time Period 60 days combined    SLP Start Time 1315    SLP Stop Time  1400    SLP Time Calculation (min) 45 min    Activity Tolerance Patient tolerated treatment well           Past Medical History:  Diagnosis Date  . Allergy   . Anxiety   . Arthritis   . High triglycerides   . Kidney stones   . Migraines   . Recurrent sinus infections     Past Surgical History:  Procedure Laterality Date  . ANTERIOR CRUCIATE LIGAMENT REPAIR Left   . BUNIONECTOMY Right   . LASIK    . LITHOTRIPSY    . MENISCUS REPAIR Left   . NASAL SEPTUM SURGERY    . SHOULDER SURGERY    . VARICOSE VEIN SURGERY Left     There were no vitals filed for this visit.   Subjective Assessment - 12/06/20 1314    Subjective nodded head    Currently in Pain? No/denies                 ADULT SLP TREATMENT - 12/06/20 1313      General Information   Behavior/Cognition Alert;Cooperative;Pleasant mood;Impulsive      Cognitive-Linquistic Treatment   Treatment focused on Aphasia;Apraxia;Patient/family/caregiver education    Skilled Treatment SLP reviewed HEP for words with beginning with same intial sound /n/ and /d/. Pt able to pronounce 7% (4/42 words) without SLP modeling. With min to max verbal and visual cues, pt able to pronounce words with 50% accuracy. Increased difficulty for /d/ versus /n/. SLP trialed 2-4 conversational phrases, with max SLP modeling noted. Pt was approximately 10% accuracy for phrases.  SLP re-educated patient on using Lingraphica due severe apraxia impacting verbal expression and listener comprehension. Pt able to use Lingraphica with good accuracy when assessed. Pt used Lingraphica x1 at end of session to report reason for missing next ST session.      Assessment / Recommendations / Plan   Plan Continue with current plan of care      Progression Toward Goals   Progression toward goals Progressing toward goals              SLP Short Term Goals - 11/21/20 1454      SLP SHORT TERM GOAL #1   Title Given SLP modeling, pt will simultaneously produce 3 different phonemes for word approximations with 50% accuracy over three sessions    Baseline 10-28-20, 11-04-20, 11-09-20    Period --   or 9 total visits   Status Achieved      SLP SHORT TERM GOAL #2   Title Pt will ID personally relevant objects in f:4 for communication of wants/needs with 75% accuracy with min A over 3 sessions    Baseline 11-04-20, 11-07-20    Status Partially Met      SLP SHORT TERM GOAL #3   Title Pt will write word or word approximations to aid communication for 3/5  opportunities with min A over 3 sessions.    Baseline 11-07-20, 11-09-20, 11-17-20    Status Achieved      SLP SHORT TERM GOAL #4   Title Pt will use mulitmodal communication (gesture, draw, write 1st letter etc) to augment verbal expression with usual min A over 3 sessions    Baseline 10-28-20 (alphabet board), 11-04-20 (draw), 11-07-20, 11-09-20    Status Achieved      SLP SHORT TERM GOAL #5   Title Pt will verbalize salient functional phrases with use of MIT with usual min A over 3 sessions    Status Not Met            SLP Long Term Goals - 12/06/20 1314      SLP LONG TERM GOAL #1   Title Pt will accurately produce phonemes to facilitate word approximations for 3 personally relevant words with min A over 2 sessions    Time 2    Period Weeks   or 17 total visits   Status On-going      SLP LONG TERM GOAL #2   Title Pt will ID  personally relevant objects in f:8 for communication of wants/needs with 75% accuracy given rare min A over 3 sessions    Baseline 12-06-20    Time 2    Period Weeks    Status On-going      SLP LONG TERM GOAL #3   Title Pt will use mulitmodal communication (gesture, draw, write 1st letter etc) to augment verbal expression to meet needs at home with rare min A from family.    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 SLP targeted speech production of words beginning with more consistent speech sounds /n/ and /d/. Pt able to produce 7% of targeted items (3/42 words) independently. With SLP modeling and usual mod to max verbal and visual cues, accuracy improved to 50% accuracy. Approximations noted intermittently. SLP trialed simple 3-4 word conversational phrases, in which pt was ~10% accurate given consistent SLP modeling and cues. SLP educated patient on utilzing communication device to aid verbal expression due to limited listener comprehension secondary to severe apraxia and aphasia. SLP recommends skilled ST intervention to maximize current communication skills to increase communication effectiveness as well as target speech related tasks as pt desires to speak if possible.    Speech Therapy Frequency 2x / week    Duration 8 weeks    Treatment/Interventions Oral motor exercises;Compensatory strategies;Cueing hierarchy;Functional tasks;Patient/family education;Cognitive reorganization;Multimodal communcation approach;Language facilitation;Compensatory techniques;Internal/external aids;SLP instruction and feedback    Potential to  Achieve Goals Fair    Potential Considerations Previous level of function;Severity of impairments    SLP Home Exercise Plan provided    Consulted and Agree with Plan of Care Patient           Patient will benefit from skilled therapeutic intervention in order to improve the following deficits and impairments:   Verbal apraxia  Aphasia    Problem List Patient  Active Problem List   Diagnosis Date Noted  . 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 Acute blood loss anemia   . Hypoalbuminemia due to protein-calorie malnutrition ([place])   . Thrombocytopenia ([place])   . Dysphagia 07/29/2019  . Infarction of left basal ganglia ([place]) 07/18/2019  . Hypernatremia   . Leukocytosis   . Essential hypertension   . Global aphasia   . Cytotoxic brain edema ([place]) 07/10/2019  . ICH (intracerebral hemorrhage) ([place]) 07/09/2019  . Anxiety state 02/21/2015  . Cephalalgia 12/21/2014    Alinda Deem, MA CCC-SLP 12/06/2020, 2:38 PM  [facility] [address] [place], [place], [address] Phone: [phone]   Fax:  [phone]   Name: Merly Hinkson Da Glennie Hawk MRN: [id number] Date of Birth: Oct 08, 1965

## 2020-12-07 ENCOUNTER — Ambulatory Visit: Payer: Managed Care, Other (non HMO)

## 2020-12-09 ENCOUNTER — Emergency Department (HOSPITAL_COMMUNITY): Payer: Managed Care, Other (non HMO)

## 2020-12-09 ENCOUNTER — Other Ambulatory Visit: Payer: Self-pay

## 2020-12-09 ENCOUNTER — Inpatient Hospital Stay (HOSPITAL_COMMUNITY)
Admission: EM | Admit: 2020-12-09 | Discharge: 2020-12-16 | DRG: 871 | Disposition: A | Discharge status: Rehabilitation Facility | Payer: Managed Care, Other (non HMO) | Attending: Internal Medicine | Admitting: Internal Medicine

## 2020-12-09 DIAGNOSIS — N202 Calculus of kidney with calculus of ureter: Secondary | ICD-10-CM | POA: Diagnosis present

## 2020-12-09 DIAGNOSIS — Z8349 Family history of other endocrine, nutritional and metabolic diseases: Secondary | ICD-10-CM | POA: Diagnosis not present

## 2020-12-09 DIAGNOSIS — Z79899 Other long term (current) drug therapy: Secondary | ICD-10-CM | POA: Diagnosis not present

## 2020-12-09 DIAGNOSIS — Z880 Allergy status to penicillin: Secondary | ICD-10-CM | POA: Diagnosis not present

## 2020-12-09 DIAGNOSIS — I10 Essential (primary) hypertension: Secondary | ICD-10-CM | POA: Diagnosis present

## 2020-12-09 DIAGNOSIS — A419 Sepsis, unspecified organism: Secondary | ICD-10-CM

## 2020-12-09 DIAGNOSIS — N390 Urinary tract infection, site not specified: Secondary | ICD-10-CM | POA: Diagnosis not present

## 2020-12-09 DIAGNOSIS — N200 Calculus of kidney: Secondary | ICD-10-CM | POA: Diagnosis not present

## 2020-12-09 DIAGNOSIS — E876 Hypokalemia: Secondary | ICD-10-CM | POA: Diagnosis not present

## 2020-12-09 DIAGNOSIS — R5381 Other malaise: Secondary | ICD-10-CM | POA: Diagnosis present

## 2020-12-09 DIAGNOSIS — R739 Hyperglycemia, unspecified: Secondary | ICD-10-CM | POA: Diagnosis not present

## 2020-12-09 DIAGNOSIS — N170 Acute kidney failure with tubular necrosis: Secondary | ICD-10-CM | POA: Diagnosis not present

## 2020-12-09 DIAGNOSIS — I69320 Aphasia following cerebral infarction: Secondary | ICD-10-CM

## 2020-12-09 DIAGNOSIS — N12 Tubulo-interstitial nephritis, not specified as acute or chronic: Secondary | ICD-10-CM

## 2020-12-09 DIAGNOSIS — Z833 Family history of diabetes mellitus: Secondary | ICD-10-CM | POA: Diagnosis not present

## 2020-12-09 DIAGNOSIS — N179 Acute kidney failure, unspecified: Secondary | ICD-10-CM

## 2020-12-09 DIAGNOSIS — N201 Calculus of ureter: Secondary | ICD-10-CM | POA: Diagnosis present

## 2020-12-09 DIAGNOSIS — R159 Full incontinence of feces: Secondary | ICD-10-CM | POA: Diagnosis present

## 2020-12-09 DIAGNOSIS — I69351 Hemiplegia and hemiparesis following cerebral infarction affecting right dominant side: Secondary | ICD-10-CM | POA: Diagnosis not present

## 2020-12-09 DIAGNOSIS — R4701 Aphasia: Secondary | ICD-10-CM | POA: Diagnosis not present

## 2020-12-09 DIAGNOSIS — D696 Thrombocytopenia, unspecified: Secondary | ICD-10-CM | POA: Diagnosis not present

## 2020-12-09 DIAGNOSIS — G9341 Metabolic encephalopathy: Secondary | ICD-10-CM | POA: Diagnosis present

## 2020-12-09 DIAGNOSIS — A4151 Sepsis due to Escherichia coli [E. coli]: Principal | ICD-10-CM | POA: Diagnosis present

## 2020-12-09 DIAGNOSIS — Z87891 Personal history of nicotine dependence: Secondary | ICD-10-CM | POA: Diagnosis not present

## 2020-12-09 DIAGNOSIS — Z91013 Allergy to seafood: Secondary | ICD-10-CM | POA: Diagnosis not present

## 2020-12-09 DIAGNOSIS — E162 Hypoglycemia, unspecified: Secondary | ICD-10-CM | POA: Diagnosis not present

## 2020-12-09 DIAGNOSIS — Z993 Dependence on wheelchair: Secondary | ICD-10-CM

## 2020-12-09 DIAGNOSIS — Z87442 Personal history of urinary calculi: Secondary | ICD-10-CM

## 2020-12-09 DIAGNOSIS — N136 Pyonephrosis: Secondary | ICD-10-CM | POA: Diagnosis present

## 2020-12-09 DIAGNOSIS — K828 Other specified diseases of gallbladder: Secondary | ICD-10-CM | POA: Diagnosis not present

## 2020-12-09 DIAGNOSIS — R230 Cyanosis: Secondary | ICD-10-CM | POA: Diagnosis present

## 2020-12-09 DIAGNOSIS — R7401 Elevation of levels of liver transaminase levels: Secondary | ICD-10-CM | POA: Diagnosis not present

## 2020-12-09 DIAGNOSIS — B9620 Unspecified Escherichia coli [E. coli] as the cause of diseases classified elsewhere: Secondary | ICD-10-CM | POA: Diagnosis not present

## 2020-12-09 DIAGNOSIS — E871 Hypo-osmolality and hyponatremia: Secondary | ICD-10-CM | POA: Diagnosis not present

## 2020-12-09 DIAGNOSIS — R7989 Other specified abnormal findings of blood chemistry: Secondary | ICD-10-CM | POA: Diagnosis present

## 2020-12-09 DIAGNOSIS — F4320 Adjustment disorder, unspecified: Secondary | ICD-10-CM | POA: Diagnosis present

## 2020-12-09 DIAGNOSIS — Z820 Family history of epilepsy and other diseases of the nervous system: Secondary | ICD-10-CM | POA: Diagnosis not present

## 2020-12-09 DIAGNOSIS — Z20822 Contact with and (suspected) exposure to covid-19: Secondary | ICD-10-CM | POA: Diagnosis present

## 2020-12-09 DIAGNOSIS — Z8249 Family history of ischemic heart disease and other diseases of the circulatory system: Secondary | ICD-10-CM | POA: Diagnosis not present

## 2020-12-09 DIAGNOSIS — R339 Retention of urine, unspecified: Secondary | ICD-10-CM | POA: Diagnosis not present

## 2020-12-09 DIAGNOSIS — R579 Shock, unspecified: Secondary | ICD-10-CM | POA: Diagnosis not present

## 2020-12-09 DIAGNOSIS — R6521 Severe sepsis with septic shock: Secondary | ICD-10-CM

## 2020-12-09 DIAGNOSIS — D6959 Other secondary thrombocytopenia: Secondary | ICD-10-CM | POA: Diagnosis present

## 2020-12-09 DIAGNOSIS — D72829 Elevated white blood cell count, unspecified: Secondary | ICD-10-CM | POA: Diagnosis not present

## 2020-12-09 HISTORY — PX: IR NEPHROSTOMY PLACEMENT RIGHT: IMG6064

## 2020-12-09 LAB — CBC WITH DIFFERENTIAL/PLATELET
Abs Immature Granulocytes: 0 10*3/uL (ref 0.00–0.07)
Basophils Absolute: 0 10*3/uL (ref 0.0–0.1)
Basophils Relative: 0 %
Eosinophils Absolute: 0 10*3/uL (ref 0.0–0.5)
Eosinophils Relative: 0 %
HCT: 38.7 % (ref 36.0–46.0)
Hemoglobin: 12.7 g/dL (ref 12.0–15.0)
Lymphocytes Relative: 3 %
Lymphs Abs: 0.5 10*3/uL — ABNORMAL LOW (ref 0.7–4.0)
MCH: 30.3 pg (ref 26.0–34.0)
MCHC: 32.8 g/dL (ref 30.0–36.0)
MCV: 92.4 fL (ref 80.0–100.0)
Monocytes Absolute: 0.8 10*3/uL (ref 0.1–1.0)
Monocytes Relative: 5 %
Neutro Abs: 13.8 10*3/uL — ABNORMAL HIGH (ref 1.7–7.7)
Neutrophils Relative %: 92 %
Platelets: UNDETERMINED 10*3/uL (ref 150–400)
RBC: 4.19 MIL/uL (ref 3.87–5.11)
RDW: 14.2 % (ref 11.5–15.5)
WBC Morphology: INCREASED
WBC: 15 10*3/uL — ABNORMAL HIGH (ref 4.0–10.5)
nRBC: 0 % (ref 0.0–0.2)

## 2020-12-09 LAB — URINALYSIS, ROUTINE W REFLEX MICROSCOPIC
Bilirubin Urine: NEGATIVE
Glucose, UA: NEGATIVE mg/dL
Ketones, ur: NEGATIVE mg/dL
Nitrite: NEGATIVE
Protein, ur: 100 mg/dL — AB
RBC / HPF: >50 RBC/hpf — ABNORMAL HIGH (ref 0–5)
Specific Gravity, Urine: 1.016 (ref 1.005–1.030)
WBC, UA: >50 WBC/hpf — ABNORMAL HIGH (ref 0–5)
pH: 5 (ref 5.0–8.0)

## 2020-12-09 LAB — LACTIC ACID, PLASMA
Lactic Acid, Venous: 5.6 mmol/L (ref 0.5–1.9)
Lactic Acid, Venous: 2.8 mmol/L (ref 0.5–1.9)

## 2020-12-09 LAB — COMPREHENSIVE METABOLIC PANEL
ALT: 35 U/L (ref 0–44)
AST: 38 U/L (ref 15–41)
Albumin: 2.7 g/dL — ABNORMAL LOW (ref 3.5–5.0)
Alkaline Phosphatase: 128 U/L — ABNORMAL HIGH (ref 38–126)
Anion gap: 12 (ref 5–15)
BUN: 43 mg/dL — ABNORMAL HIGH (ref 6–20)
CO2: 20 mmol/L — ABNORMAL LOW (ref 22–32)
Calcium: 9.6 mg/dL (ref 8.9–10.3)
Chloride: 104 mmol/L (ref 98–111)
Creatinine, Ser: 3.36 mg/dL — ABNORMAL HIGH (ref 0.44–1.00)
GFR, Estimated: 16 mL/min — ABNORMAL LOW (ref >60)
Glucose, Bld: 153 mg/dL — ABNORMAL HIGH (ref 70–99)
Potassium: 3 mmol/L — ABNORMAL LOW (ref 3.5–5.1)
Sodium: 136 mmol/L (ref 135–145)
Total Bilirubin: 1.1 mg/dL (ref 0.3–1.2)
Total Protein: 5.4 g/dL — ABNORMAL LOW (ref 6.5–8.1)

## 2020-12-09 LAB — TROPONIN I (HIGH SENSITIVITY)
Troponin I (High Sensitivity): 63 ng/L — ABNORMAL HIGH (ref <18)
Troponin I (High Sensitivity): 44 ng/L — ABNORMAL HIGH (ref <18)

## 2020-12-09 LAB — RESP PANEL BY RT-PCR (FLU A&B, COVID) ARPGX2
Influenza A by PCR: NEGATIVE
Influenza B by PCR: NEGATIVE
SARS Coronavirus 2 by RT PCR: NEGATIVE

## 2020-12-09 MED ORDER — FENTANYL CITRATE (PF) 100 MCG/2ML IJ SOLN
INTRAMUSCULAR | Status: AC
  Filled 2020-12-09: qty 2

## 2020-12-09 MED ORDER — POTASSIUM CHLORIDE 10 MEQ/100ML IV SOLN
10.0000 meq | Freq: Once | INTRAVENOUS | Status: AC
  Administered 2020-12-09: 10 meq via INTRAVENOUS
  Filled 2020-12-09: qty 100

## 2020-12-09 MED ORDER — LIDOCAINE HCL 1 % IJ SOLN
INTRAMUSCULAR | Status: AC | PRN
  Administered 2020-12-09: 5 mL

## 2020-12-09 MED ORDER — SODIUM CHLORIDE 0.9 % IV SOLN: 2.0000 g | INTRAVENOUS | Status: DC

## 2020-12-09 MED ORDER — IOHEXOL 300 MG/ML  SOLN: 50.0000 mL | Freq: Once | INTRAMUSCULAR | Status: DC | PRN

## 2020-12-09 MED ORDER — INSULIN ASPART 100 UNIT/ML IJ SOLN
0.0000 [IU] | INTRAMUSCULAR | Status: DC
  Administered 2020-12-10 – 2020-12-14 (×8): 1 [IU] via SUBCUTANEOUS
  Administered 2020-12-14: 3 [IU] via SUBCUTANEOUS

## 2020-12-09 MED ORDER — POLYETHYLENE GLYCOL 3350 17 G PO PACK: 17.0000 g | PACK | Freq: Every day | ORAL | Status: DC | PRN

## 2020-12-09 MED ORDER — VANCOMYCIN VARIABLE DOSE PER UNSTABLE RENAL FUNCTION (PHARMACIST DOSING): Status: DC

## 2020-12-09 MED ORDER — LACTATED RINGERS IV SOLN: INTRAVENOUS | Status: AC

## 2020-12-09 MED ORDER — MIDAZOLAM HCL 2 MG/2ML IJ SOLN
INTRAMUSCULAR | Status: AC
  Filled 2020-12-09: qty 2

## 2020-12-09 MED ORDER — METRONIDAZOLE 500 MG/100ML IV SOLN
500.0000 mg | Freq: Once | INTRAVENOUS | Status: AC
  Administered 2020-12-09: 500 mg via INTRAVENOUS
  Filled 2020-12-09: qty 100

## 2020-12-09 MED ORDER — SODIUM CHLORIDE 0.9 % IV BOLUS
1000.0000 mL | Freq: Once | INTRAVENOUS | Status: AC
  Administered 2020-12-09: 1000 mL via INTRAVENOUS

## 2020-12-09 MED ORDER — SODIUM CHLORIDE 0.9 % IV BOLUS
30.0000 mL/kg | Freq: Once | INTRAVENOUS | Status: AC
  Administered 2020-12-09: 1899 mL via INTRAVENOUS

## 2020-12-09 MED ORDER — VANCOMYCIN HCL 1250 MG/250ML IV SOLN
1250.0000 mg | Freq: Once | INTRAVENOUS | Status: AC
  Administered 2020-12-09: 1250 mg via INTRAVENOUS
  Filled 2020-12-09: qty 250

## 2020-12-09 MED ORDER — LIDOCAINE-EPINEPHRINE 1 %-1:100000 IJ SOLN: 10.0000 mL | Freq: Once | INTRAMUSCULAR | Status: DC

## 2020-12-09 MED ORDER — ONDANSETRON HCL 4 MG/2ML IJ SOLN
4.0000 mg | Freq: Once | INTRAMUSCULAR | Status: AC
  Administered 2020-12-09: 4 mg via INTRAVENOUS
  Filled 2020-12-09: qty 2

## 2020-12-09 MED ORDER — DOCUSATE SODIUM 100 MG PO CAPS: 100.0000 mg | ORAL_CAPSULE | Freq: Two times a day (BID) | ORAL | Status: DC | PRN

## 2020-12-09 MED ORDER — SODIUM CHLORIDE 0.9 % IV SOLN
2.0000 g | Freq: Once | INTRAVENOUS | Status: AC
  Administered 2020-12-09: 2 g via INTRAVENOUS
  Filled 2020-12-09: qty 2

## 2020-12-09 MED ORDER — NOREPINEPHRINE 4 MG/250ML-% IV SOLN
0.0000 ug/min | INTRAVENOUS | Status: DC
  Administered 2020-12-09: 2 ug/min via INTRAVENOUS
  Administered 2020-12-10: 19 ug/min via INTRAVENOUS
  Administered 2020-12-10: 22 ug/min via INTRAVENOUS
  Filled 2020-12-09 (×6): qty 250

## 2020-12-09 MED ORDER — FENTANYL CITRATE (PF) 100 MCG/2ML IJ SOLN
25.0000 ug | Freq: Once | INTRAMUSCULAR | Status: AC
  Administered 2020-12-09: 25 ug via INTRAVENOUS
  Filled 2020-12-09: qty 2

## 2020-12-09 MED ORDER — LIDOCAINE HCL (PF) 1 % IJ SOLN
INTRAMUSCULAR | Status: AC
  Filled 2020-12-09: qty 30

## 2020-12-09 NOTE — ED Notes (Signed)
Patient to IR at this time.   

## 2020-12-09 NOTE — ED Provider Notes (Signed)
.  Central Line  Date/Time: 12/09/2020 8:18 PM Performed by: Luna Fuse, MD Authorized by: Luna Fuse, MD   Consent:    Consent obtained:  Verbal   Consent given by:  Spouse   Alternatives discussed:  No treatment, delayed treatment and alternative treatment Pre-procedure details:    Indication(s): central venous access     Skin preparation:  Chlorhexidine Sedation:    Sedation type:  None Anesthesia:    Anesthesia method:  Local infiltration   Local anesthetic:  Lidocaine 1% w/o epi Procedure details:    Location:  R internal jugular   Procedural supplies:  Triple lumen   Number of attempts:  1   Successful placement: yes   Post-procedure details:    Post-procedure:  Dressing applied and line sutured   Assessment:  Blood return through all ports and free fluid flow   Procedure completion:  Tolerated Comments:     Seldinger technique with ultrasound guidance.  Maximal sterile barrier technique used.      Luna Fuse, MD 12/09/20 2020

## 2020-12-09 NOTE — ED Provider Notes (Signed)
[facility] EMER[facility]ENC[facility] DEPARTMENT Provider Note   CSN: [id number] Arrival date & time: 12/09/20  1731     History Chief Complaint  Patient presents with  . Loss of Consciousness    Mignon Hutto [name] is a 55 y.o. 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  She is brought into the ED via EMS from home with syncopal episode.  Level 5 caveat secondary to chronic aphasia.  Patient's husband arrived and provides some additional history.  States the caregiver was caring for her throughout the day.  She slept a little bit longer than usual this morning.  She ate breakfast like usual, which is where her daily medications are administered crushed in her yogurt.  Later this afternoon the patient went to stand up and she fell back into the chair.  This is unusual for her.  He states the caregiver reported that she then fed the patient applesauce, peanut butters and cracker, a large glass of water which she ate.  The caregiver called patient's husband.  Just before husband arrived home patient became less responsive and was slumped over in the chair.  Husband states when he saw her she was slumped over in the chair and her bottom lip looked blue in color though she was breathing.  He also states she was incontinent of stool which is unusual for her.  No known melena.  Patient has received 1L fluid bolus by EMS prior to arrival.  The history is provided by the spouse. History limited by: chronic global aphasia.       Past Medical History:  Diagnosis Date  . Allergy   . Anxiety   . Arthritis   . High triglycerides   . Kidney stones   . Migraines   . Recurrent sinus infections     Patient Active Problem List   Diagnosis Date Noted  . Septic shock ([place]) 12/09/2020  . 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 Nontraumatic subcortical hemorrhage of left cerebral  hemisphere ([place]) 08/27/2019  . Acute blood loss anemia   . Hypoalbuminemia due to protein-calorie malnutrition ([place])   . Thrombocytopenia ([place])   . Dysphagia 07/29/2019  . Infarction of left basal ganglia ([place]) 07/18/2019  . Hypernatremia   . Leukocytosis   . Essential hypertension   . Global aphasia   . 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) 07/10/2019  . ICH (intracerebral hemorrhage) ([place]) 07/09/2019  . Anxiety state 02/21/2015  . Cephalalgia 12/21/2014    Past Surgical History:  Procedure Laterality Date  . ANTERIOR CRUCIATE LIGAMENT REPAIR Left   . BUNIONECTOMY Right   . LASIK    . LITHOTRIPSY    . MENISCUS REPAIR Left   . NASAL SEPTUM SURGERY    . SHOULDER SURGERY    . VARICOSE VEIN SURGERY Left      OB History   No obstetric history on file.     Family History  Problem Relation Age of Onset  . Alzheimer's disease Mother   . Hypertension Mother   . Diabetes Mother   . Thyroid disease Mother   . Deep vein thrombosis Maternal Grandmother   . Colon cancer Neg Hx   . Colon polyps Neg Hx   . Esophageal cancer Neg Hx   . Pancreatic cancer Neg Hx   . Rectal cancer Neg Hx   . Stomach cancer Neg Hx   . Breast cancer Neg Hx     Social History  Tobacco Use  . Smoking status: Former Research scientist (life sciences)  . Smokeless tobacco: Never Used  Vaping Use  . Vaping Use: Never used  Substance Use Topics  . Alcohol use: Yes    Alcohol/week: 0.0 standard drinks    Comment: social   . Drug use: No    Home Medications Prior to Admission medications   Medication Sig Start Date End Date Taking? Authorizing Provider  amLODipine (NORVASC) 10 MG tablet Take 10 mg by mouth daily.   Yes [provider]  atorvastatin (LIPITOR) 20 MG tablet Take 1 tablet (20 mg total) by mouth daily at 6 PM. 08/20/19  Yes Angiulli, Lavon Paganini, PA-C  B Complex-C (B-COMPLEX WITH VITAMIN C) tablet Take 1 tablet by mouth daily.   Yes [provider]  CALCIUM PO Take 1 tablet by mouth daily.   Yes  [provider]  cholecalciferol (VITAMIN D) 1000 units tablet Take 1,000 Units by mouth daily. 2,000 units daily   Yes [provider]  docusate sodium (COLACE) 100 MG capsule Take 300 mg by mouth daily.   Yes [provider]  magnesium 30 MG tablet Take 30 mg by mouth daily.   Yes [provider]  Omega-3 Fatty Acids (FISH OIL) 1200 MG CPDR Take 1 capsule by mouth daily.   Yes [provider]  QUEtiapine (SEROQUEL) 50 MG tablet Take 1 tablet (50 mg total) by mouth 2 (two) times daily. 11/10/20  Yes Kirsteins, [name], MD  sertraline (ZOLOFT) 50 MG tablet Take 1 tablet (50 mg total) by mouth daily. 11/10/20  Yes Kirsteins, [name], MD  VITAMIN E PO Take 1 tablet by mouth daily.    Yes [provider]  zinc sulfate 220 (50 Zn) MG capsule Take 220 mg by mouth daily.   Yes [provider]  gabapentin (NEURONTIN) 100 MG capsule Take 1 capsule (100 mg total) by mouth 3 (three) times daily. Patient not taking: Reported on 12/09/2020 05/06/20   Charlett Blake, MD  LORazepam (ATIVAN) 1 MG tablet Take 1 tablet (1 mg total) by mouth every 8 (eight) hours as needed for anxiety. Patient not taking: Reported on 12/09/2020 08/29/19   Milton Ferguson, MD    Allergies    Amoxicillin and Shellfish allergy  Review of Systems   Review of Systems  Unable to perform ROS: Patient nonverbal    Physical Exam Updated Vital Signs BP (!) 88/66   Pulse (!) 112   Temp 99.1 F (37.3 C) (Rectal)   Resp (!) 23   Ht 5\' 7"  (1.702 m)   Wt 63.3 kg   SpO2 99%   BMI 21.86 kg/m   Physical Exam Vitals and nursing note reviewed.  Constitutional:      Appearance: She is well-developed.  HENT:     Head: Normocephalic and atraumatic.     Mouth/Throat:     Comments: Lips and oral secretions appear dry Eyes:     Conjunctiva/sclera: Conjunctivae normal.  Cardiovascular:     Rate and Rhythm: Regular rhythm. Tachycardia present.  Pulmonary:     Effort:  Pulmonary effort is normal. No respiratory distress.     Breath sounds: Normal breath sounds.  Abdominal:     General: Bowel sounds are normal.     Palpations: Abdomen is soft.     Tenderness: There is no abdominal tenderness. There is no guarding or rebound.  Skin:    General: Skin is warm.     Coloration: Skin is pale.  Neurological:  Mental Status: She is alert.     Comments: Right sided paralysis. Moving left upper and lower extremities on command.   Psychiatric:        Behavior: Behavior normal.     ED Results / Procedures / Treatments   Labs (all labs ordered are listed, but only abnormal results are displayed) Labs Reviewed  COMPREHENSIVE METABOLIC PANEL - Abnormal; Notable for the following components:      Result Value   Potassium 3.0 (*)    CO2 20 (*)    Glucose, Bld 153 (*)    BUN 43 (*)    Creatinine, Ser 3.36 (*)    Total Protein 5.4 (*)    Albumin 2.7 (*)    Alkaline Phosphatase 128 (*)    GFR, Estimated 16 (*)    All other components within normal limits  CBC WITH DIFFERENTIAL/PLATELET - Abnormal; Notable for the following components:   WBC 15.0 (*)    Neutro Abs 13.8 (*)    Lymphs Abs 0.5 (*)    All other components within normal limits  LACTIC ACID, PLASMA - Abnormal; Notable for the following components:   Lactic Acid, Venous 5.6 (*)    All other components within normal limits  LACTIC ACID, PLASMA - Abnormal; Notable for the following components:   Lactic Acid, Venous 2.8 (*)    All other components within normal limits  URINALYSIS, ROUTINE W REFLEX MICROSCOPIC - Abnormal; Notable for the following components:   APPearance TURBID (*)    Hgb urine dipstick LARGE (*)    Protein, ur 100 (*)    Leukocytes,Ua MODERATE (*)    RBC / HPF >50 (*)    WBC, UA >50 (*)    Bacteria, UA MANY (*)    Non Squamous Epithelial 11-20 (*)    All other components within normal limits  CBG MONITORING, ED - Abnormal; Notable for the following components:    Glucose-Capillary 164 (*)    All other components within normal limits  TROPONIN I (HIGH SENSITIVITY) - Abnormal; Notable for the following components:   Troponin I (High Sensitivity) 63 (*)    All other components within normal limits  TROPONIN I (HIGH SENSITIVITY) - Abnormal; Notable for the following components:   Troponin I (High Sensitivity) 44 (*)    All other components within normal limits  RESP PANEL BY RT-PCR (FLU A&B, COVID) ARPGX2  CULTURE, BLOOD (ROUTINE X 2)  CULTURE, BLOOD (ROUTINE X 2)  URINE CULTURE  PATHOLOGIST SMEAR REVIEW  PROTIME-INR  HIV ANTIBODY (ROUTINE TESTING W REFLEX)  CBC  BASIC METABOLIC PANEL  MAGNESIUM  PHOSPHORUS  PROCALCITONIN  PROCALCITONIN  HEMOGLOBIN A1C    EKG EKG Interpretation  Date/Time:  Friday Dec 09 2020 17:32:47 EDT Ventricular Rate:  107 PR Interval:  118 QRS Duration: 79 QT Interval:  331 QTC Calculation: 442 R Axis:   48 Text Interpretation: Sinus tachycardia Confirmed by Thamas Jaegers ([id number]) on 12/09/2020 8:18:37 PM   Radiology DG Chest Port 1 View  Result Date: 12/09/2020 CLINICAL DATA:  Central line placement. EXAM: PORTABLE CHEST 1 VIEW COMPARISON:  Chest x-ray from same day at 1853 hours. FINDINGS: New right internal jugular central venous catheter with tip near the cavoatrial junction. The heart size and mediastinal contours are within normal limits. Mild bibasilar atelectasis. No focal consolidation, pleural effusion, or pneumothorax. No acute osseous abnormality. IMPRESSION: 1. New right internal jugular central venous catheter without complicating feature. Electronically Signed   By: Titus Dubin M.D.   On:  12/09/2020 20:34   DG Chest Port 1 View  Result Date: 12/09/2020 CLINICAL DATA:  Syncope EXAM: PORTABLE CHEST 1 VIEW COMPARISON:  12/19/2019 FINDINGS: The heart size and mediastinal contours are within normal limits. Both lungs are clear. Inferior subluxation of right humeral head as before IMPRESSION: No active  disease. Electronically Signed   By: Donavan Foil M.D.   On: 12/09/2020 19:17   CT Renal Stone Study  Result Date: 12/09/2020 CLINICAL DATA:  Sepsis.  History of obstructive renal calculi EXAM: CT ABDOMEN AND PELVIS WITHOUT CONTRAST TECHNIQUE: Multidetector CT imaging of the abdomen and pelvis was performed following the standard protocol without oral or IV contrast. COMPARISON:  None. FINDINGS: Lower chest: There is ill-defined patchy opacity in the lung bases primarily due to atelectasis. Hepatobiliary: No focal liver lesions are appreciable on this noncontrast enhanced study. The gallbladder is mildly distended with apparent sludge within the gallbladder. No appreciable biliary duct dilatation. Pancreas: There is no evident pancreatic mass or inflammatory focus. Spleen: Probable small granuloma in the spleen. Spleen otherwise appears unremarkable. Adrenals/Urinary Tract: Right adrenal appears normal. 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 Question left adrenal cyst versus adenoma. The attenuation of this mass is in the cystic range. The right kidney is edematous. There are small areas of air within the right kidney, consistent with emphysematous pyelitis. There is mild hydronephrosis on the right. There is no appreciable hydronephrosis on the left. No air seen within the left kidney/collecting system. No renal masses. There are scattered 1-2 mm calculi throughout the left kidney. There is a 4 x 2 mm calculus in the lower pole left kidney. There are scattered 1-2 mm calculi throughout the right kidney. There are two adjacent 3 mm calculi in the mid to lower right kidney with nearby 1-2 mm calculi. There is a calculus in the mid to lower right kidney occupying a calyx measuring 1.8 x 0.8 cm. There is a calculus with immediately adjacent air at the right ureteropelvic junction measuring 8 x 6 mm. No other ureteral calculi are evident. Urinary bladder is midline with wall thickness overall  increased. Stomach/Bowel: Stomach filled with fluid. Gastric wall not thickened. No small or large bowel wall thickening evident. No appreciable bowel obstruction. Terminal ileum appears unremarkable. There are several calcifications within the appendix. Appendix is normal in size and contour without appendiceal wall thickening. There is no pneumoperitoneum or portal venous air. Vascular/Lymphatic: Foci of aortic atherosclerosis. No aneurysm of the aorta evident. No adenopathy in the abdomen or pelvis. Reproductive: Uterus anteverted. No uterine or adnexal masses are evident. Other: There is a small amount of ascites in the dependent portion of the pelvis. No abscess in the abdomen or pelvis. Musculoskeletal: Degenerative change in the lower thoracic and lumbar regions. There is lumbar levoscoliosis. No blastic or lytic bone lesions. No intramuscular or abdominal wall lesions. IMPRESSION: 1. Foci of air within right renal collecting system as well as at the right ureteropelvic junction consistent with a degree of emphysematous pyelitis. Mild hydronephrosis on the right. There is an 8 x 6 calculus with immediately adjacent air at the right ureteropelvic junction. No other ureteral calculi. Note that right kidney appears edematous. 2. Multiple calculi in each kidney, nonobstructing as noted above. Largest of these calculi measures 1.8 x 0.8 cm in a lower pole calyx on the right. 3. No appreciable bowel wall or mesenteric thickening. No bowel obstruction. No abscess in the abdomen or pelvis. Appendix normal in size and contour  without inflammation. There are calcifications within the appendix. 4.  Small amount of ascites in dependent portion pelvis. 5. Gallbladder is rather distended and contains sludge. This finding may warrant gallbladder ultrasound to further evaluate. 6.  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 7. Probable atelectasis in the lung bases. Mild superimposed pneumonia in the bases is  difficult to exclude in this circumstance. 8.  Aortic Atherosclerosis (ICD10-I70.0). Electronically Signed   By: Lowella Grip III M.D.   On: 12/09/2020 21:04    Procedures .Critical Care Performed by: Blue Ruggerio, [place] N, PA-C Authorized by: Loralyn Rachel, [place] N, PA-C   Critical care provider statement:    Critical care time (minutes):  90   Critical care time was exclusive of:  Separately billable procedures and treating other patients and teaching time   Critical care was necessary to treat or prevent imminent or life-threatening deterioration of the following conditions:  Shock and sepsis   Critical care was time spent personally by me on the following activities:  Discussions with consultants, evaluation of patient's response to treatment, examination of patient, ordering and performing treatments and interventions, ordering and review of laboratory studies, ordering and review of radiographic studies, pulse oximetry, re-evaluation of patient's condition, obtaining history from patient or surrogate and review of old charts   Care discussed with: admitting provider       Medications Ordered in ED Medications  norepinephrine (LEVOPHED) 4mg  in 249mL premix infusion (22 mcg/min Intravenous Rate/Dose Change 12/10/20 0012)  lactated ringers infusion ( Intravenous New Bag/Given 12/09/20 2050)  lidocaine-EPINEPHrine (XYLOCAINE W/EPI) 1 %-1:100000 (with pres) injection 10 mL (10 mLs Intradermal Not Given 12/09/20 1959)  ceFEPIme (MAXIPIME) 2 g in sodium chloride 0.9 % 100 mL IVPB (has no administration in time range)  vancomycin variable dose per unstable renal function (pharmacist dosing) (has no administration in time range)  lidocaine (PF) (XYLOCAINE) 1 % injection (has no administration in time range)  iohexol (OMNIPAQUE) 300 MG/ML solution 50 mL (has no administration in time range)  docusate sodium (COLACE) capsule 100 mg (has no administration in time range)  polyethylene glycol (MIRALAX  / GLYCOLAX) packet 17 g (has no administration in time range)  insulin aspart (novoLOG) injection 0-6 Units (has no administration in time range)  sodium chloride 0.9 % bolus 1,000 mL (0 mLs Intravenous Stopped 12/09/20 1853)  sodium chloride 0.9 % bolus 1,899 mL (0 mL/kg  63.3 kg Intravenous Stopped 12/09/20 1954)  ceFEPIme (MAXIPIME) 2 g in sodium chloride 0.9 % 100 mL IVPB (0 g Intravenous Stopped 12/09/20 2037)  metroNIDAZOLE (FLAGYL) IVPB 500 mg (0 mg Intravenous Stopped 12/09/20 2106)  vancomycin (VANCOREADY) IVPB 1250 mg/250 mL (0 mg Intravenous Stopped 12/09/20 2235)  potassium chloride 10 mEq in 100 mL IVPB (0 mEq Intravenous Stopped 12/09/20 2158)  ondansetron (ZOFRAN) injection 4 mg (4 mg Intravenous Given 12/09/20 2143)  fentaNYL (SUBLIMAZE) injection 25 mcg (25 mcg Intravenous Given 12/09/20 2143)  lidocaine (XYLOCAINE) 1 % (with pres) injection (5 mLs Infiltration Given 12/09/20 2311)    ED Course  I have reviewed the triage vital signs and the nursing notes.  Pertinent labs & imaging results that were available during my care of the patient were reviewed by me and considered in my medical decision making (see chart for details).  Clinical Course as of 12/10/20 3710  Fri Dec 09, 2020  1828 Patient is here from home with syncopal episode.  She is noted to be significantly hypotensive and slightly tachycardic.  BPs are  equal to bilateral arms.  Unknown source at this time.  Afebrile.  We will look for infectious etiology, will also check troponin.  2 IVs placed.  IV fluid bolus for blood pressure support.  Will closely monitor. [JR]  1911 BP not responding to IVF, has received over 2L IVF. Will start levophed, Dr. Almyra Free to place central line. Labs with leukocytosis and lactic acidosis. Patient meets sepsis criteria, code sepsis initiated [JR]  2000 Patient's husband provides additional history regarding history of obstructed ureteral stone requiring lithotripsy and stenting.  RN reports in  and out cath showed cloudy urine that looked grossly infected, brownish in color.  CT stone study ordered [JR]  2010 Central line is successfully placed per attending physician Dr. Almyra Free.  See his separate procedural note. [JR]  2113 Lactic acid is improving. [JR]  2114 Levophed at 6 mcg per minute, slight improvement in MAP to 61 [JR]  2122 Consulted with urology, spoke with Dr. Lovena Neighbours.  Needs nephrostomy urgently.  He will place call to IR. [JR]  2133 BP(!): 80/59 BP Improving, MAP 67 [JR]    Clinical Course User Index [JR] Maximus Hoffert, [place] N, PA-C   MDM Rules/Calculators/A&P                          Patient is a 55 year old female with presentation as above.  Hypotensive and tachycardic on arrival.  Afebrile both oral and rectal temps.  At her mental baseline per husband.  CBC results with leukocytosis and lactic acidosis is present.  Code sepsis is initiated.  Broad-spectrum antibiotics.  Her blood pressures however continue to downtrend despite IV fluid resuscitation. After about 3 L of IV fluids, decision to start Levophed was made. Right IJV central line placed by attending Dr. Almyra Free, see his separate procedure note.    Levophed showing slow improvement with continuous titration.  Initial lactic acid is significantly elevated at 5.6, improved to 2.8 on recheck.  Initial troponin is also 67 though believe this is more likely demand ischemia, downtrending on recheck.  Metabolic panel is significant for acute renal insufficiency with creatinine of 3.36, BUN of 43.  Bicarb is 20.  Potassium is 3, IV replacement ordered.  Urinalysis by in-and-out catheter is consistent with infection with a bacteria, greater than 50 white cells, moderate leukocytes, large blood is also noted.  Patient's husband provides additional information with history of obstructive nephropathy.  Concern for infected stone, CT stone study is obtained.  COVID and flu swabs are negative.  Consult placed to critical care for  admission.    Stone study reveals findings concerning for infected stone at the right UPJ.  Consult was placed to Dr. Lovena Neighbours with urology, who recommends urgent nephrostomy tube placement.    Dr. Celesta Aver with critical care returns call and evaluates patient in the ED will admit to ICU.  Patient is taken to IR for nephrostomy placement.  BP shows some improvement, Levophed at 22 mcg/min with blood pressure of 88/66 with a MAP of 75.  Final Clinical Impression(s) / ED Diagnoses Final diagnoses:  Septic shock ([place])  Emphysematous pyelitis  Right ureteral stone    Rx / DC Orders ED Discharge Orders    None       AmeLie Hollars, [place] N, PA-C 12/10/20 0028    Luna Fuse, MD 12/12/20 [phone]

## 2020-12-09 NOTE — ED Notes (Signed)
Central line being placed at this time.

## 2020-12-09 NOTE — Progress Notes (Signed)
Pharmacy Antibiotic Note  Melanie Madden Melanie Madden is a 55 y.o. female admitted on 12/09/2020 with sepsis.  Pharmacy has been consulted for cefepime and vancomycin dosing. Patient originally presented with generalized weakness. Patient has chronic global aphasia and limited mobility. Current WBC 15.0, Scr 3.36 (BL<1), and LA 5.6.   Plan: Cefepime 2G Q24H  Vancomycine 1250 MG x1 followed by variable dosing per renal function  Monitor renal function for dose adjustments, clinical status, and treatment plan  Vancomycin levels as indicated  Height: 5\' 7"  (170.2 cm) Weight: 63.3 kg (139 lb 8.8 oz) IBW/kg (Calculated) : 61.6  Temp (24hrs), Avg:98.1 F (36.7 C), Min:98.1 F (36.7 C), Max:98.1 F (36.7 C)  Recent Labs  Lab 12/09/20 1748 12/09/20 1749  WBC 15.0*  --   CREATININE 3.36*  --   LATICACIDVEN  --  5.6*    Estimated Creatinine Clearance: 18.4 mL/min (A) (by C-G formula based on SCr of 3.36 mg/dL (H)).    Allergies  Allergen Reactions  . Amoxicillin Nausea Only  . Shellfish Allergy Diarrhea and Nausea And Vomiting    Mussels     Antimicrobials this admission: 5/13 Cefepime>> 5/13 Vanc>> 5/13 Flagyl>>  Dose adjustments this admission: NA  Microbiology results: 5/13 BCx:    Thank you for allowing pharmacy to be a part of this patient's care.  Cephus Slater, PharmD, [facility] Resident [phone] 12/09/2020 7:58 PM

## 2020-12-09 NOTE — Procedures (Signed)
Pre Procedure Dx: Urosepsis Post Procedural Dx: Same  Successful [place] and fluoroscopic guided placement of a right sided PCN with end coiled and locked within the renal pelvis. PCN connected to gravity bag.  EBL: Minimal  Complications: None immediate.  Ronny Bacon, MD Pager #: [phone]

## 2020-12-09 NOTE — ED Notes (Signed)
Pat returned to ED at this time. Right nephrostomy placed. 10 fr. No sedation.

## 2020-12-09 NOTE — ED Triage Notes (Signed)
Patient to ED via EMS for complaints of syncopal episode. Per EMS patient BP was 86V systolic, [id number] IVFB en route. At this time BP still 29B systolic. Patient with Hx of CVA, non verbal at baseline with right sided paralysis.

## 2020-12-09 NOTE — ED Notes (Signed)
LA 5.6 per lab at this time

## 2020-12-09 NOTE — H&P (Signed)
Melanie Madden Melanie Madden is an 55 y.o. female.   Chief Complaint: Syncope  HPI:  Melanie Madden. 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  She presented to the ED via EMS from home after a syncopal episode. Patient's husband reported that the caregiver was caring for his wife throughout the day.  She slept a little bit longer than usual this morning but ate breakfast like usual.  This afternoon the patient had a syncopal event while trying to stand. Patient fell onto the coach and EMS was called.  It was noted that she had central cyanosis but never loss her pulses.  The patient was found to have refractory hypotension in the ED after a total of 3 liters of fluids.  A right IJ TLC was placed and Levophed drip was started. Patient went for CT scan of the abdomen and pelvis and was found to have multiple abnormal findings as below.  However most noted she was found to have "foci of air within right renal collecting system as well as atthe right ureteropelvic junction consistent with a degree of emphysematous pyelitis"  Hence interventional radiology team was consulted and patient had [place] and fluroscopic guidance of percutaneous nephrostomy.  The patient's husband was not available to assist in this consultation and patient is aphasic.  Hence this consult was done with assistance from Ed provider.  I have seen and examined the patient.  Past Medical History:  Diagnosis Date  . Allergy   . Anxiety   . Arthritis   . High triglycerides   . Kidney stones   . Migraines   . Recurrent sinus infections     Past Surgical History:  Procedure Laterality Date  . ANTERIOR CRUCIATE LIGAMENT REPAIR Left   . BUNIONECTOMY Right   . LASIK    . LITHOTRIPSY    . MENISCUS REPAIR Left   . NASAL SEPTUM SURGERY    . SHOULDER SURGERY    . VARICOSE VEIN SURGERY Left     Family History  Problem Relation  Age of Onset  . Alzheimer's disease Mother   . Hypertension Mother   . Diabetes Mother   . Thyroid disease Mother   . Deep vein thrombosis Maternal Grandmother   . Colon cancer Neg Hx   . Colon polyps Neg Hx   . Esophageal cancer Neg Hx   . Pancreatic cancer Neg Hx   . Rectal cancer Neg Hx   . Stomach cancer Neg Hx   . Breast cancer Neg Hx    Social History:  reports that she has quit smoking. She has never used smokeless tobacco. She reports current alcohol use. She reports that she does not use drugs.  Allergies:  Allergies  Allergen Reactions  . Amoxicillin Nausea Only  . Shellfish Allergy Diarrhea and Nausea And Vomiting    Mussels     (Not in a hospital admission)   Results for orders placed or performed during the hospital encounter of 12/09/20 (from the past 48 hour(s))  Comprehensive metabolic panel     Status: Abnormal   Collection Time: 12/09/20  5:48 PM  Result Value Ref Range   Sodium 136 135 - 145 mmol/L   Potassium 3.0 (L) 3.5 - 5.1 mmol/L   Chloride 104 98 - 111 mmol/L   CO2 20 (L) 22 - 32 mmol/L   Glucose, Bld 153 (H) 70 - 99 mg/dL    Comment: Glucose  reference range applies only to samples taken after fasting for at least 8 hours.   BUN 43 (H) 6 - 20 mg/dL   Creatinine, Ser 3.36 (H) 0.44 - 1.00 mg/dL   Calcium 9.6 8.9 - 10.3 mg/dL   Total Protein 5.4 (L) 6.5 - 8.1 g/dL   Albumin 2.7 (L) 3.5 - 5.0 g/dL   AST 38 15 - 41 U/L   ALT 35 0 - 44 U/L   Alkaline Phosphatase 128 (H) 38 - 126 U/L   Total Bilirubin 1.1 0.3 - 1.2 mg/dL   GFR, Estimated 16 (L) >60 mL/min    Comment: (NOTE) Calculated using the CKD-EPI Creatinine Equation (2021)    Anion gap 12 5 - 15    Comment: Performed at [facility], [place] [address], [place], [place] [address]  CBC with Differential     Status: Abnormal   Collection Time: 12/09/20  5:48 PM  Result Value Ref Range   WBC 15.0 (H) 4.0 - 10.5 K/uL   RBC 4.19 3.87 - 5.11 MIL/uL   Hemoglobin 12.7 12.0 - 15.0 g/dL   HCT  38.7 36.0 - 46.0 %   MCV 92.4 80.0 - 100.0 fL   MCH 30.3 26.0 - 34.0 pg   MCHC 32.8 30.0 - 36.0 g/dL   RDW 14.2 11.5 - 15.5 %   Platelets PLATELET CLUMPS NOTED ON SMEAR, UNABLE TO ESTIMATE 150 - 400 K/uL    Comment: Immature Platelet Fraction may be clinically indicated, consider ordering this additional test [id number]    nRBC 0.0 0.0 - 0.2 %   Neutrophils Relative % 92 %   Neutro Abs 13.8 (H) 1.7 - 7.7 K/uL   Lymphocytes Relative 3 %   Lymphs Abs 0.5 (L) 0.7 - 4.0 K/uL   Monocytes Relative 5 %   Monocytes Absolute 0.8 0.1 - 1.0 K/uL   Eosinophils Relative 0 %   Eosinophils Absolute 0.0 0.0 - 0.5 K/uL   Basophils Relative 0 %   Basophils Absolute 0.0 0.0 - 0.1 K/uL   WBC Morphology INCREASED BANDS (>20% BANDS)    Abs Immature Granulocytes 0.00 0.00 - 0.07 K/uL   Dohle Bodies PRESENT     Comment: Performed at [facility] [address] [place], [place] [address]  Troponin I (High Sensitivity)     Status: Abnormal   Collection Time: 12/09/20  5:48 PM  Result Value Ref Range   Troponin I (High Sensitivity) 63 (H) <18 ng/L    Comment: (NOTE) Elevated high sensitivity troponin I (hsTnI) values and significant  changes across serial measurements may suggest ACS but many other  chronic and acute conditions are known to elevate hsTnI results.  Refer to the "Links" section for chest pain algorithms and additional  guidance. Performed at [facility], [place] [address], [place], [place] [address]   Lactic acid, plasma     Status: Abnormal   Collection Time: 12/09/20  5:49 PM  Result Value Ref Range   Lactic Acid, Venous 5.6 (HH) 0.5 - 1.9 mmol/L    Comment: CRITICAL RESULT CALLED TO, READ BACK BY AND VERIFIED WITH: J.BURTON,RN @1859  12/09/2020 [email or web address] Performed at [facility], [place] [address], [place], [place] [address]   Urinalysis, Routine w reflex microscopic Urine, Catheterized     Status: Abnormal   Collection Time: 12/09/20  5:49 PM  Result Value Ref Range    Color, Urine YELLOW YELLOW   APPearance TURBID (A) CLEAR   Specific Gravity, Urine  1.016 1.005 - 1.030   pH 5.0 5.0 - 8.0   Glucose, UA NEGATIVE NEGATIVE mg/dL   Hgb urine dipstick LARGE (A) NEGATIVE   Bilirubin Urine NEGATIVE NEGATIVE   Ketones, ur NEGATIVE NEGATIVE mg/dL   Protein, ur 100 (A) NEGATIVE mg/dL   Nitrite NEGATIVE NEGATIVE   Leukocytes,Ua MODERATE (A) NEGATIVE   RBC / HPF >50 (H) 0 - 5 RBC/hpf   WBC, UA >50 (H) 0 - 5 WBC/hpf   Bacteria, UA MANY (A) NONE SEEN   Squamous Epithelial / LPF 0-5 0 - 5   WBC Clumps PRESENT    Mucus PRESENT    Non Squamous Epithelial 11-20 (A) NONE SEEN    Comment: Performed at [facility], [place] [address], [place], [place] [address]  Lactic acid, plasma     Status: Abnormal   Collection Time: 12/09/20  7:49 PM  Result Value Ref Range   Lactic Acid, Venous 2.8 (HH) 0.5 - 1.9 mmol/L    Comment: CRITICAL VALUE NOTED.  VALUE IS CONSISTENT WITH PREVIOUSLY REPORTED AND CALLED VALUE. Performed at [facility], [place] [address] [place], [place] [address]   Troponin I (High Sensitivity)     Status: Abnormal   Collection Time: 12/09/20  7:49 PM  Result Value Ref Range   Troponin I (High Sensitivity) 44 (H) <18 ng/L    Comment: (NOTE) Elevated high sensitivity troponin I (hsTnI) values and significant  changes across serial measurements may suggest ACS but many other  chronic and acute conditions are known to elevate hsTnI results.  Refer to the "Links" section for chest pain algorithms and additional  guidance. Performed at [facility], [place] [address] [place], [place] [address]   Resp Panel by RT-PCR (Flu A&B, Covid) Nasopharyngeal Swab     Status: None   Collection Time: 12/09/20  9:56 PM   Specimen: Nasopharyngeal Swab; Nasopharyngeal(NP) swabs in vial transport medium  Result Value Ref Range   SARS Coronavirus 2 by RT PCR NEGATIVE NEGATIVE    Comment: (NOTE) SARS-CoV-2 target nucleic acids are NOT DETECTED.  The  SARS-CoV-2 RNA is generally detectable in upper respiratory specimens during the acute phase of infection. The lowest concentration of SARS-CoV-2 viral copies this assay can detect is 138 copies/mL. A negative result does not preclude SARS-Cov-2 infection and should not be used as the sole basis for treatment or other patient management decisions. A negative result may occur with  improper specimen collection/handling, submission of specimen other than nasopharyngeal swab, presence of viral mutation(s) within the areas targeted by this assay, and inadequate number of viral copies(<138 copies/mL). A negative result must be combined with clinical observations, patient history, and epidemiological information. The expected result is Negative.  Fact Sheet for Patients:  EntrepreneurPulse.com.au  Fact Sheet for Healthcare Providers:  IncredibleEmployment.be  This test is no t yet approved or cleared by the [place] FDA and  has been authorized for detection and/or diagnosis of SARS-CoV-2 by FDA under an Emergency Use Authorization (EUA). This EUA will remain  in effect (meaning this test can be used) for the duration of the COVID-19 declaration under Section 564(b)(1) of the Act, 21 U.S.C.section 360bbb-3(b)(1), unless the authorization is terminated  or revoked sooner.       Influenza A by PCR NEGATIVE NEGATIVE   Influenza B by PCR NEGATIVE NEGATIVE    Comment: (NOTE) The Xpert Xpress SARS-CoV-2/FLU/RSV plus assay is intended as an aid in the diagnosis of influenza from Nasopharyngeal swab  specimens and should not be used as a sole basis for treatment. Nasal washings and aspirates are unacceptable for Xpert Xpress SARS-CoV-2/FLU/RSV testing.  Fact Sheet for Patients: EntrepreneurPulse.com.au  Fact Sheet for Healthcare Providers: IncredibleEmployment.be  This test is not yet approved or cleared by the  [place] FDA and has been authorized for detection and/or diagnosis of SARS-CoV-2 by FDA under an Emergency Use Authorization (EUA). This EUA will remain in effect (meaning this test can be used) for the duration of the COVID-19 declaration under Section 564(b)(1) of the Act, 21 U.S.C. section 360bbb-3(b)(1), unless the authorization is terminated or revoked.  Performed at [facility], [place] [address], [place], [place] [address]    DG Chest Port 1 View  Result Date: 12/09/2020 CLINICAL DATA:  Central line placement. EXAM: PORTABLE CHEST 1 VIEW COMPARISON:  Chest x-ray from same day at 1853 hours. FINDINGS: New right internal jugular central venous catheter with tip near the cavoatrial junction. The heart size and mediastinal contours are within normal limits. Mild bibasilar atelectasis. No focal consolidation, pleural effusion, or pneumothorax. No acute osseous abnormality. IMPRESSION: 1. New right internal jugular central venous catheter without complicating feature. Electronically Signed   By: Titus Dubin M.D.   On: 12/09/2020 20:34   DG Chest Port 1 View  Result Date: 12/09/2020 CLINICAL DATA:  Syncope EXAM: PORTABLE CHEST 1 VIEW COMPARISON:  12/19/2019 FINDINGS: The heart size and mediastinal contours are within normal limits. Both lungs are clear. Inferior subluxation of right humeral head as before IMPRESSION: No active disease. Electronically Signed   By: Donavan Foil M.D.   On: 12/09/2020 19:17   CT Renal Stone Study  Result Date: 12/09/2020 CLINICAL DATA:  Sepsis.  History of obstructive renal calculi EXAM: CT ABDOMEN AND PELVIS WITHOUT CONTRAST TECHNIQUE: Multidetector CT imaging of the abdomen and pelvis was performed following the standard protocol without oral or IV contrast. COMPARISON:  None. FINDINGS: Lower chest: There is ill-defined patchy opacity in the lung bases primarily due to atelectasis. Hepatobiliary: No focal liver lesions are appreciable on this  noncontrast enhanced study. The gallbladder is mildly distended with apparent sludge within the gallbladder. No appreciable biliary duct dilatation. Pancreas: There is no evident pancreatic mass or inflammatory focus. Spleen: Probable small granuloma in the spleen. Spleen otherwise appears unremarkable. Adrenals/Urinary Tract: Right adrenal appears normal. 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 Question left adrenal cyst versus adenoma. The attenuation of this mass is in the cystic range. The right kidney is edematous. There are small areas of air within the right kidney, consistent with emphysematous pyelitis. There is mild hydronephrosis on the right. There is no appreciable hydronephrosis on the left. No air seen within the left kidney/collecting system. No renal masses. There are scattered 1-2 mm calculi throughout the left kidney. There is a 4 x 2 mm calculus in the lower pole left kidney. There are scattered 1-2 mm calculi throughout the right kidney. There are two adjacent 3 mm calculi in the mid to lower right kidney with nearby 1-2 mm calculi. There is a calculus in the mid to lower right kidney occupying a calyx measuring 1.8 x 0.8 cm. There is a calculus with immediately adjacent air at the right ureteropelvic junction measuring 8 x 6 mm. No other ureteral calculi are evident. Urinary bladder is midline with wall thickness overall increased. Stomach/Bowel: Stomach filled with fluid. Gastric wall not thickened. No small or large bowel wall thickening evident. No appreciable bowel obstruction.  Terminal ileum appears unremarkable. There are several calcifications within the appendix. Appendix is normal in size and contour without appendiceal wall thickening. There is no pneumoperitoneum or portal venous air. Vascular/Lymphatic: Foci of aortic atherosclerosis. No aneurysm of the aorta evident. No adenopathy in the abdomen or pelvis. Reproductive: Uterus anteverted. No uterine or adnexal masses  are evident. Other: There is a small amount of ascites in the dependent portion of the pelvis. No abscess in the abdomen or pelvis. Musculoskeletal: Degenerative change in the lower thoracic and lumbar regions. There is lumbar levoscoliosis. No blastic or lytic bone lesions. No intramuscular or abdominal wall lesions. IMPRESSION: 1. Foci of air within right renal collecting system as well as at the right ureteropelvic junction consistent with a degree of emphysematous pyelitis. Mild hydronephrosis on the right. There is an 8 x 6 calculus with immediately adjacent air at the right ureteropelvic junction. No other ureteral calculi. Note that right kidney appears edematous. 2. Multiple calculi in each kidney, nonobstructing as noted above. Largest of these calculi measures 1.8 x 0.8 cm in a lower pole calyx on the right. 3. No appreciable bowel wall or mesenteric thickening. No bowel obstruction. No abscess in the abdomen or pelvis. Appendix normal in size and contour without inflammation. There are calcifications within the appendix. 4.  Small amount of ascites in dependent portion pelvis. 5. Gallbladder is rather distended and contains sludge. This finding may warrant gallbladder ultrasound to further evaluate. 6.  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 7. Probable atelectasis in the lung bases. Mild superimposed pneumonia in the bases is difficult to exclude in this circumstance. 8.  Aortic Atherosclerosis (ICD10-I70.0). Electronically Signed   By: Lowella Grip III M.D.   On: 12/09/2020 21:04    Review of Systems  Reason unable to perform ROS: patient is aphasic.    Blood pressure (!) 77/62, pulse (!) 112, temperature 99.1 F (37.3 C), temperature source Rectal, resp. rate 16, height 5\' 7"  (1.702 m), weight 63.3 kg, SpO2 99 %. Physical Exam Vitals reviewed.  HENT:     Head: Normocephalic and atraumatic.     Nose: Nose normal.     Mouth/Throat:     Comments: Unable to examine-  patient would not open her mouth big enough Eyes:     Extraocular Movements: Extraocular movements intact.     Pupils: Pupils are equal, round, and reactive to light.  Cardiovascular:     Rate and Rhythm: Regular rhythm. Tachycardia present.     Heart sounds: Normal heart sounds.  Pulmonary:     Effort: Pulmonary effort is normal.     Breath sounds: Rhonchi present. No wheezing.  Abdominal:     Palpations: Abdomen is soft.  Musculoskeletal:        General: Normal range of motion.     Cervical back: Neck supple.     Right lower leg: No edema.     Left lower leg: No edema.  Skin:    General: Skin is warm and dry.  Neurological:     Mental Status: She is alert.     Comments: Right hemiplegia from previous CVA. Aphasic      Assessment/Plan 1. 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 IR f/u.  2. Septic shock secondary to  emphysematous pyelitis. Plan: IVF, wean vasopressors as able. Trend sepsis markers.  3. Acute kidney injury secondary to  emphysematous pyelitis Obstructive uropathy-right kidney Plan:hydrate, avoid nephrotoxins  4. Gallbladder sludge Plan: US gallbladder, NPO, BS monitor.  5. Bilateral nephrolithiasis Plan: hydrate, dietitian consult for education on food to avoid.  6. Hypokalemia Plan: electrolytes replacement  7. Hx of CVA with right hemiplegia Plan: continue supportive care, aspiration precaution.  Drips: LR, Levophed Lines: right IJ TLC, PIV Prophylaxis:SCD Diet: NPO Code: full  Thank you for allowing me the privilege to care for this patient.  I have dedicated a total of 75 minutes in critical care time minus al appropriate exclusions.  Lafayette Dragon, MD 12/09/2020, 11:44 PM

## 2020-12-10 ENCOUNTER — Inpatient Hospital Stay (HOSPITAL_COMMUNITY): Payer: Managed Care, Other (non HMO)

## 2020-12-10 DIAGNOSIS — R579 Shock, unspecified: Secondary | ICD-10-CM | POA: Diagnosis not present

## 2020-12-10 LAB — BLOOD CULTURE ID PANEL (REFLEXED) - BCID2

## 2020-12-10 LAB — HIV ANTIBODY (ROUTINE TESTING W REFLEX): HIV Screen 4th Generation wRfx: NONREACTIVE

## 2020-12-10 LAB — BASIC METABOLIC PANEL
Anion gap: 13 (ref 5–15)
BUN: 50 mg/dL — ABNORMAL HIGH (ref 6–20)
CO2: 17 mmol/L — ABNORMAL LOW (ref 22–32)
Calcium: 8.2 mg/dL — ABNORMAL LOW (ref 8.9–10.3)
Chloride: 103 mmol/L (ref 98–111)
Creatinine, Ser: 3.19 mg/dL — ABNORMAL HIGH (ref 0.44–1.00)
GFR, Estimated: 17 mL/min — ABNORMAL LOW (ref >60)
Glucose, Bld: 177 mg/dL — ABNORMAL HIGH (ref 70–99)
Potassium: 4.4 mmol/L (ref 3.5–5.1)
Sodium: 133 mmol/L — ABNORMAL LOW (ref 135–145)

## 2020-12-10 LAB — CBC
HCT: 39.1 % (ref 36.0–46.0)
Hemoglobin: 12.7 g/dL (ref 12.0–15.0)
MCH: 29.8 pg (ref 26.0–34.0)
MCHC: 32.5 g/dL (ref 30.0–36.0)
MCV: 91.8 fL (ref 80.0–100.0)
Platelets: 65 10*3/uL — ABNORMAL LOW (ref 150–400)
RBC: 4.26 MIL/uL (ref 3.87–5.11)
RDW: 14.6 % (ref 11.5–15.5)
WBC: 22.4 10*3/uL — ABNORMAL HIGH (ref 4.0–10.5)
nRBC: 0 % (ref 0.0–0.2)

## 2020-12-10 LAB — PROCALCITONIN
Procalcitonin: 99.04 ng/mL
Procalcitonin: 108.9 ng/mL

## 2020-12-10 LAB — HEMOGLOBIN A1C
Hgb A1c MFr Bld: 5.2 % (ref 4.8–5.6)
Mean Plasma Glucose: 102.54 mg/dL

## 2020-12-10 LAB — GLUCOSE, CAPILLARY
Glucose-Capillary: 118 mg/dL — ABNORMAL HIGH (ref 70–99)
Glucose-Capillary: 136 mg/dL — ABNORMAL HIGH (ref 70–99)
Glucose-Capillary: 139 mg/dL — ABNORMAL HIGH (ref 70–99)
Glucose-Capillary: 140 mg/dL — ABNORMAL HIGH (ref 70–99)
Glucose-Capillary: 147 mg/dL — ABNORMAL HIGH (ref 70–99)
Glucose-Capillary: 163 mg/dL — ABNORMAL HIGH (ref 70–99)

## 2020-12-10 LAB — MRSA PCR SCREENING: MRSA by PCR: NEGATIVE

## 2020-12-10 LAB — PROTIME-INR
INR: 1.8 — ABNORMAL HIGH (ref 0.8–1.2)
Prothrombin Time: 20.7 seconds — ABNORMAL HIGH (ref 11.4–15.2)

## 2020-12-10 LAB — MAGNESIUM: Magnesium: 1.6 mg/dL — ABNORMAL LOW (ref 1.7–2.4)

## 2020-12-10 LAB — CBG MONITORING, ED: Glucose-Capillary: 164 mg/dL — ABNORMAL HIGH (ref 70–99)

## 2020-12-10 LAB — PHOSPHORUS: Phosphorus: 4.2 mg/dL (ref 2.5–4.6)

## 2020-12-10 MED ORDER — FENTANYL CITRATE (PF) 100 MCG/2ML IJ SOLN
50.0000 ug | INTRAMUSCULAR | Status: DC | PRN
  Administered 2020-12-10: 50 ug via INTRAVENOUS
  Filled 2020-12-10: qty 2

## 2020-12-10 MED ORDER — SODIUM CHLORIDE 0.9 % IV SOLN
2.0000 g | INTRAVENOUS | Status: DC
  Administered 2020-12-10 – 2020-12-15 (×6): 2 g via INTRAVENOUS
  Filled 2020-12-10 (×7): qty 20

## 2020-12-10 MED ORDER — FENTANYL CITRATE (PF) 100 MCG/2ML IJ SOLN: 25.0000 ug | INTRAMUSCULAR | Status: DC | PRN

## 2020-12-10 MED ORDER — HEPARIN SODIUM (PORCINE) 5000 UNIT/ML IJ SOLN
5000.0000 [IU] | Freq: Three times a day (TID) | INTRAMUSCULAR | Status: DC
  Administered 2020-12-10 – 2020-12-11 (×3): 5000 [IU] via SUBCUTANEOUS
  Filled 2020-12-10 (×3): qty 1

## 2020-12-10 MED ORDER — OXYCODONE HCL 5 MG PO TABS
5.0000 mg | ORAL_TABLET | Freq: Four times a day (QID) | ORAL | Status: DC | PRN
  Administered 2020-12-10 (×2): 5 mg via ORAL
  Filled 2020-12-10 (×2): qty 1

## 2020-12-10 MED ORDER — SODIUM CHLORIDE 0.9 % IV SOLN
2.2500 g | Freq: Three times a day (TID) | INTRAVENOUS | Status: AC
  Administered 2020-12-10: 2.25 g via INTRAVENOUS
  Filled 2020-12-10 (×2): qty 10

## 2020-12-10 MED ORDER — MAGNESIUM SULFATE 2 GM/50ML IV SOLN
2.0000 g | Freq: Once | INTRAVENOUS | Status: AC
  Administered 2020-12-10: 2 g via INTRAVENOUS
  Filled 2020-12-10: qty 50

## 2020-12-10 MED ORDER — BOOST / RESOURCE BREEZE PO LIQD CUSTOM
1.0000 | Freq: Three times a day (TID) | ORAL | Status: DC
  Administered 2020-12-10 – 2020-12-15 (×13): 1 via ORAL

## 2020-12-10 MED ORDER — ORAL CARE MOUTH RINSE
15.0000 mL | Freq: Two times a day (BID) | OROMUCOSAL | Status: DC
  Administered 2020-12-10 – 2020-12-15 (×11): 15 mL via OROMUCOSAL

## 2020-12-10 MED ORDER — SODIUM CHLORIDE 0.9% FLUSH
10.0000 mL | INTRAVENOUS | Status: DC | PRN
Start: 2020-12-10 — End: 2020-12-16
  Administered 2020-12-14: 10 mL

## 2020-12-10 MED ORDER — FENTANYL CITRATE (PF) 100 MCG/2ML IJ SOLN
INTRAMUSCULAR | Status: AC
  Administered 2020-12-10: 25 ug via INTRAVENOUS
  Filled 2020-12-10: qty 2

## 2020-12-10 MED ORDER — CHLORHEXIDINE GLUCONATE CLOTH 2 % EX PADS
6.0000 | MEDICATED_PAD | Freq: Every day | CUTANEOUS | Status: DC
  Administered 2020-12-10 – 2020-12-16 (×7): 6 via TOPICAL

## 2020-12-10 MED ORDER — SODIUM CHLORIDE 0.9% FLUSH
10.0000 mL | Freq: Two times a day (BID) | INTRAVENOUS | Status: DC
  Administered 2020-12-10 – 2020-12-16 (×13): 10 mL

## 2020-12-10 MED ORDER — CHLORHEXIDINE GLUCONATE CLOTH 2 % EX PADS
6.0000 | MEDICATED_PAD | Freq: Every day | CUTANEOUS | Status: DC
  Administered 2020-12-10: 6 via TOPICAL

## 2020-12-10 NOTE — Progress Notes (Signed)
Referring Physician(s): Dr. Gilford Rile  Supervising Physician: Arne Cleveland  Patient Status:  [facility] - In-pt  Chief Complaint: Obstructing right ureteral calculus; urosepsis - S/p right PCN placed by Dr. Pascal Lux 12/09/20  Subjective: Patient in bed, awake and trying to communicate. She is gesturing around the room and pointing at various things - unable to determine what she is trying to communicate. Right PCN intact with approximately 30 ml of thin bloody fluid in gravity bag.   Allergies: Amoxicillin and Shellfish allergy  Medications: Prior to Admission medications   Medication Sig Start Date End Date Taking? Authorizing Provider  amLODipine (NORVASC) 10 MG tablet Take 10 mg by mouth daily.   Yes [provider]  atorvastatin (LIPITOR) 20 MG tablet Take 1 tablet (20 mg total) by mouth daily at 6 PM. 08/20/19  Yes Angiulli, Lavon Paganini, PA-C  B Complex-C (B-COMPLEX WITH VITAMIN C) tablet Take 1 tablet by mouth daily.   Yes [provider]  CALCIUM PO Take 1 tablet by mouth daily.   Yes [provider]  cholecalciferol (VITAMIN D) 1000 units tablet Take 1,000 Units by mouth daily. 2,000 units daily   Yes [provider]  docusate sodium (COLACE) 100 MG capsule Take 300 mg by mouth daily.   Yes [provider]  magnesium 30 MG tablet Take 30 mg by mouth daily.   Yes [provider]  Omega-3 Fatty Acids (FISH OIL) 1200 MG CPDR Take 1 capsule by mouth daily.   Yes [provider]  QUEtiapine (SEROQUEL) 50 MG tablet Take 1 tablet (50 mg total) by mouth 2 (two) times daily. 11/10/20  Yes Kirsteins, [name], MD  sertraline (ZOLOFT) 50 MG tablet Take 1 tablet (50 mg total) by mouth daily. 11/10/20  Yes Kirsteins, [name], MD  VITAMIN E PO Take 1 tablet by mouth daily.    Yes [provider]  zinc sulfate 220 (50 Zn) MG capsule Take 220 mg by mouth daily.   Yes [provider]  gabapentin (NEURONTIN) 100 MG capsule Take  1 capsule (100 mg total) by mouth 3 (three) times daily. Patient not taking: Reported on 12/09/2020 05/06/20   Charlett Blake, MD  LORazepam (ATIVAN) 1 MG tablet Take 1 tablet (1 mg total) by mouth every 8 (eight) hours as needed for anxiety. Patient not taking: Reported on 12/09/2020 08/29/19   Milton Ferguson, MD     Vital Signs: BP 99/75   Pulse (!) 106   Temp 98.9 F (37.2 C) (Axillary)   Resp 17   Ht 5\' 7"  (1.702 m)   Wt 156 lb 4.9 oz (70.9 kg)   SpO2 95%   BMI 24.48 kg/m   Physical Exam Constitutional:      Comments: aphasic  Abdominal:     Comments: Right PCN to gravity. Approximately 30 ml of thin blood-tinged fluid in bag.   Neurological:     Mental Status: She is alert.     Comments: Unable to fully assess mental status.      Imaging: DG Chest Port 1 View  Result Date: 12/09/2020 CLINICAL DATA:  Central line placement. EXAM: PORTABLE CHEST 1 VIEW COMPARISON:  Chest x-ray from same day at 1853 hours. FINDINGS: New right internal jugular central venous catheter with tip near the cavoatrial junction. The heart size and mediastinal contours are within normal limits. Mild bibasilar atelectasis. No focal consolidation, pleural effusion, or pneumothorax. No acute osseous abnormality. IMPRESSION: 1. New right internal jugular central venous catheter without  complicating feature. Electronically Signed   By: Titus Dubin M.D.   On: 12/09/2020 20:34   DG Chest Port 1 View  Result Date: 12/09/2020 CLINICAL DATA:  Syncope EXAM: PORTABLE CHEST 1 VIEW COMPARISON:  12/19/2019 FINDINGS: The heart size and mediastinal contours are within normal limits. Both lungs are clear. Inferior subluxation of right humeral head as before IMPRESSION: No active disease. Electronically Signed   By: Donavan Foil M.D.   On: 12/09/2020 19:17   CT Renal Stone Study  Result Date: 12/09/2020 CLINICAL DATA:  Sepsis.  History of obstructive renal calculi EXAM: CT ABDOMEN AND PELVIS WITHOUT CONTRAST  TECHNIQUE: Multidetector CT imaging of the abdomen and pelvis was performed following the standard protocol without oral or IV contrast. COMPARISON:  None. FINDINGS: Lower chest: There is ill-defined patchy opacity in the lung bases primarily due to atelectasis. Hepatobiliary: No focal liver lesions are appreciable on this noncontrast enhanced study. The gallbladder is mildly distended with apparent sludge within the gallbladder. No appreciable biliary duct dilatation. Pancreas: There is no evident pancreatic mass or inflammatory focus. Spleen: Probable small granuloma in the spleen. Spleen otherwise appears unremarkable. Adrenals/Urinary Tract: Right adrenal appears normal. 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 Question left adrenal cyst versus adenoma. The attenuation of this mass is in the cystic range. The right kidney is edematous. There are small areas of air within the right kidney, consistent with emphysematous pyelitis. There is mild hydronephrosis on the right. There is no appreciable hydronephrosis on the left. No air seen within the left kidney/collecting system. No renal masses. There are scattered 1-2 mm calculi throughout the left kidney. There is a 4 x 2 mm calculus in the lower pole left kidney. There are scattered 1-2 mm calculi throughout the right kidney. There are two adjacent 3 mm calculi in the mid to lower right kidney with nearby 1-2 mm calculi. There is a calculus in the mid to lower right kidney occupying a calyx measuring 1.8 x 0.8 cm. There is a calculus with immediately adjacent air at the right ureteropelvic junction measuring 8 x 6 mm. No other ureteral calculi are evident. Urinary bladder is midline with wall thickness overall increased. Stomach/Bowel: Stomach filled with fluid. Gastric wall not thickened. No small or large bowel wall thickening evident. No appreciable bowel obstruction. Terminal ileum appears unremarkable. There are several calcifications within the  appendix. Appendix is normal in size and contour without appendiceal wall thickening. There is no pneumoperitoneum or portal venous air. Vascular/Lymphatic: Foci of aortic atherosclerosis. No aneurysm of the aorta evident. No adenopathy in the abdomen or pelvis. Reproductive: Uterus anteverted. No uterine or adnexal masses are evident. Other: There is a small amount of ascites in the dependent portion of the pelvis. No abscess in the abdomen or pelvis. Musculoskeletal: Degenerative change in the lower thoracic and lumbar regions. There is lumbar levoscoliosis. No blastic or lytic bone lesions. No intramuscular or abdominal wall lesions. IMPRESSION: 1. Foci of air within right renal collecting system as well as at the right ureteropelvic junction consistent with a degree of emphysematous pyelitis. Mild hydronephrosis on the right. There is an 8 x 6 calculus with immediately adjacent air at the right ureteropelvic junction. No other ureteral calculi. Note that right kidney appears edematous. 2. Multiple calculi in each kidney, nonobstructing as noted above. Largest of these calculi measures 1.8 x 0.8 cm in a lower pole calyx on the right. 3. No appreciable bowel wall or mesenteric thickening. No bowel  obstruction. No abscess in the abdomen or pelvis. Appendix normal in size and contour without inflammation. There are calcifications within the appendix. 4.  Small amount of ascites in dependent portion pelvis. 5. Gallbladder is rather distended and contains sludge. This finding may warrant gallbladder ultrasound to further evaluate. 6.  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 7. Probable atelectasis in the lung bases. Mild superimposed pneumonia in the bases is difficult to exclude in this circumstance. 8.  Aortic Atherosclerosis (ICD10-I70.0). Electronically Signed   By: Bretta Bang III M.D.   On: 12/09/2020 21:04   IR NEPHROSTOMY PLACEMENT RIGHT  Result Date: 12/10/2020 INDICATION: History  of nephrolithiasis, now admitted with urosepsis. Please perform image guided placement of right-sided nephrostomy catheter for infection source control purposes. EXAM: ULTRASOUND AND FLUOROSCOPIC GUIDED PLACEMENT OF RIGHT NEPHROSTOMY TUBE COMPARISON:  CT abdomen pelvis-12/09/2020 MEDICATIONS: Ciprofloxacin 400 mg IV; The antibiotic was administered in an appropriate time frame prior to skin puncture. ANESTHESIA/SEDATION: None CONTRAST:  10 mL Isovue 300 - administered into the renal collecting system FLUOROSCOPY TIME:  3 minutes, 6 seconds (25 mGy) COMPLICATIONS: None immediate. PROCEDURE: The procedure, risks, benefits, and alternatives were explained to the patient's husband, questions were encouraged and answered and informed consent was obtained. A timeout was performed prior to the initiation of the procedure. The operative site was prepped and draped in the usual sterile fashion and a sterile drape was applied covering the operative field. A sterile gown and sterile gloves were used for the procedure. Local anesthesia was provided with 1% Lidocaine with epinephrine. Ultrasound was used to localize the left kidney. As there is no significant dilatation of the inferior pole renal calices, under direct ultrasound guidance, a 20 gauge needle was advanced into the renal collecting system a very mildly dilated posterior midpole calyx. An ultrasound image documentation was performed. Access within the collecting system was confirmed with the efflux of urine followed by limited contrast injection. Under intermittent fluoroscopic guidance, an 0.018 wire was advanced into the collecting system and the tract was dilated with an Accustick stent. Next, over a short Amplatz wire, the track was further dilated ultimately allowing placement of a 10-French percutaneous nephrostomy catheter with end coiled and locked within the renal pelvis. Contrast was injected and several spot fluoroscopic images were obtained in various  obliquities. The catheter was secured at the skin entrance site with an interrupted suture and a stat lock device and connected to a gravity bag. Dressings were applied. The patient tolerated procedure well without immediate postprocedural complication. FINDINGS: Ultrasound scanning demonstrates very mild left-sided pelvicaliectasis as was demonstrated on preceding noncontrast abdominal CT. As there is no significant dilatation of the inferior pole renal calices, a very mildly dilated posterior midpole calyx was targeted under direct ultrasound guidance allowing access to the left renal collecting system. Next, under fluoroscopic guidance, a 10-French percutaneous nephrostomy catheter was placed with end coiled and locked within the renal pelvis. Contrast injection confirmed appropriate positioning. IMPRESSION: Successful ultrasound and fluoroscopic guided placement of a left sided 10 French PCN. Electronically Signed   By: Simonne Come M.D.   On: 12/10/2020 08:10   US Abdomen Limited RUQ (LIVER/GB)  Result Date: 12/10/2020 CLINICAL DATA:  Sludge in the gallbladder EXAM: ULTRASOUND ABDOMEN LIMITED RIGHT UPPER QUADRANT COMPARISON:  CT abdomen pelvis 12/09/2020 FINDINGS: Gallbladder: No gallstones or wall thickening visualized. Small amount of sludge in the gallbladder. No sonographic Murphy sign noted by sonographer. Common bile duct: Diameter: 0.5 cm, within normal limits  Liver: No focal lesion identified. Within normal limits in parenchymal echogenicity. Portal vein is patent on color Doppler imaging with normal direction of blood flow towards the liver. Other: None. IMPRESSION: 1. Mild amount of sludge in the gallbladder. Otherwise unremarkable. 2.  Normal sonographic appearance of the liver. Electronically Signed   By: Audie Pinto M.D.   On: 12/10/2020 10:54    Labs:  CBC: Recent Labs    12/19/19 2057 12/09/20 1748 12/10/20 0559  WBC 6.6 15.0* 22.4*  HGB 14.6 12.7 12.7  HCT 44.3 38.7 39.1   PLT 204 PLATELET CLUMPS NOTED ON SMEAR, UNABLE TO ESTIMATE 65*    COAGS: Recent Labs    12/10/20 0559  INR 1.8*    BMP: Recent Labs    12/19/19 2057 12/09/20 1748 12/10/20 0559  NA 140 136 133*  K 4.1 3.0* 4.4  CL 103 104 103  CO2 28 20* 17*  GLUCOSE 116* 153* 177*  BUN 18 43* 50*  CALCIUM 10.1 9.6 8.2*  CREATININE 0.85 3.36* 3.19*  GFRNONAA >60 16* 17*  GFRAA >60  --   --     LIVER FUNCTION TESTS: Recent Labs    12/19/19 2057 12/09/20 1748  BILITOT 0.5 1.1  AST 21 38  ALT 32 35  ALKPHOS 58 128*  PROT 6.6 5.4*  ALBUMIN 3.6 2.7*    Assessment and Plan:  Obstructing right ureteral calculus; urosepsis - S/p right PCN placed by Dr. Pascal Lux 12/09/20  Patient is afebrile, hemoglobin stable at 12.7. WBC up to 22.4. BUN/Cr. 50/3.19. Per Epic, right PCN output is 150 ml since it was placed. There was approximately 30 ml in gravity bag.   Patient remains on Levophed for pressure support. Blood and urine cultures are pending. Per Urology patient will be seen as an outpatient for definitive stone treatment once her infection has cleared.   IR recommends to document drain output each shift. Change the dressing daily or as needed. Other plans per primary teams. IR will continue to follow.   Electronically Signed: Soyla Dryer, AGACNP-BC [phone] 12/10/2020, 1:38 PM   I spent a total of 15 Minutes at the the patient's bedside AND on the patient's hospital floor or unit, greater than 50% of which was counseling/coordinating care for right PCN

## 2020-12-10 NOTE — Progress Notes (Signed)
NAME:  Melanie Madden, MRN:  [id number], DOB:  Jul 02, 1966, LOS: 1 ADMISSION DATE:  12/09/2020, CONSULTATION DATE:  12/09/20 REFERRING MD: Thamas Jaegers  CHIEF COMPLAINT:  Syncopal episode  History of Present Illness:  Melanie Madden is a 55 y.o. 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  She is brought into the ED via EMS from home with syncopal episode.  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 She was taken to IR for percutaneous nephrostomy tube. She was started on vancomycin, cefepime and flagyl. Blood cultures are positive 2/4 bottles with enterobacterales and e. Coli.  Pertinent  Medical History  Renal Stones CVA - global aphasia Hypertension  Significant Hospital Events: Including procedures, antibiotic start and stop dates in addition to other pertinent events   . 5/13 IR placed perc nephrostomy tube and admitted to ICU  Interim History / Subjective:  No acute events since IR placement of nephrostomy tube. She is complaining of pain of her right flank.   Objective   Blood pressure 106/82, pulse (!) 111, temperature 98.9 F (37.2 C), temperature source Axillary, resp. rate (!) 35, height 5\' 7"  (1.702 m), weight 70.9 kg, SpO2 100 %.        Intake/Output Summary (Last 24 hours) at 12/10/2020 0838 Last data filed at 12/10/2020 0700 Gross per 24 hour  Intake 4674.38 ml  Output --  Net 4674.38 ml   Filed Weights   12/09/20 1742 12/10/20 0100  Weight: 63.3 kg 70.9 kg    Examination: General: mild distress, complaining of pain HENT: [place]/AT, sclera anicteric, moist mucous membranes Lungs: clear to auscultation. No wheezing or rhonchi. Cardiovascular: tachycardic, s1s2. 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 Coli  - Narrow antibiotics to ceftriaxone - Will repeat blood cultures tomorrow - Continue to wean levophed for MAP goal of 65 or greater - LR 169mL/hr for 20 hours, then boluses as needed  Acute Kidney Injury - Baseline Cr 0.63-0.8 - Cr improving, continue to monitor - Fluid resuscitation as above  Electrolytes - K, Mg - replete as needed  Best practice (right click and "Reselect all SmartList Selections" daily)  Diet:  Oral Pain/Anxiety/Delirium protocol (if indicated): No VAP protocol (if indicated): Not indicated DVT prophylaxis: Subcutaneous Heparin GI prophylaxis: N/A Glucose control:  SSI Yes Central venous access:  Yes, and it is still needed Arterial line:  N/A Foley:  N/A Mobility:  bed rest  PT consulted: N/A Last date of multidisciplinary goals of care discussion [n/a] Code Status:  full code Disposition: ICU  Labs   CBC: Recent Labs  Lab 12/09/20 1748 12/10/20 0559  WBC 15.0* 22.4*  NEUTROABS 13.8*  --   HGB 12.7 12.7  HCT 38.7 39.1  MCV 92.4 91.8  PLT PLATELET CLUMPS NOTED ON SMEAR, UNABLE TO ESTIMATE 65*    Basic Metabolic Panel: Recent Labs  Lab 12/09/20 1748 12/10/20 0559  NA 136 133*  K 3.0* 4.4  CL 104  103  CO2 20* 17*  GLUCOSE 153* 177*  BUN 43* 50*  CREATININE 3.36* 3.19*  CALCIUM 9.6 8.2*  MG  --  1.6*  PHOS  --  4.2   GFR: Estimated Creatinine Clearance: 19.4 mL/min (A) (by C-G formula based on SCr of 3.19 mg/dL (H)). Recent Labs  Lab 12/09/20 1748 12/09/20 1749 12/09/20 1949 12/09/20 2336 12/10/20 0559  PROCALCITON  --   --   --  99.04  --   WBC 15.0*  --   --   --  22.4*   LATICACIDVEN  --  5.6* 2.8*  --   --     Liver Function Tests: Recent Labs  Lab 12/09/20 1748  AST 38  ALT 35  ALKPHOS 128*  BILITOT 1.1  PROT 5.4*  ALBUMIN 2.7*   No results for input(s): LIPASE, AMYLASE in the last 168 hours. No results for input(s): AMMONIA in the last 168 hours.  ABG    Component Value Date/Time   TCO2 24 07/09/2019 1956     Coagulation Profile: Recent Labs  Lab 12/10/20 0559  INR 1.8*    Cardiac Enzymes: No results for input(s): CKTOTAL, CKMB, CKMBINDEX, TROPONINI in the last 168 hours.  HbA1C: Hgb A1c MFr Bld  Date/Time Value Ref Range Status  12/09/2020 01:11 AM 5.2 4.8 - 5.6 % Final    Comment:    (NOTE) Pre diabetes:          5.7%-6.4%  Diabetes:              >6.4%  Glycemic control for   <7.0% adults with diabetes   07/10/2019 11:45 AM 5.3 4.8 - 5.6 % Final    Comment:    (NOTE) Pre diabetes:          5.7%-6.4% Diabetes:              >6.4% Glycemic control for   <7.0% adults with diabetes     CBG: Recent Labs  Lab 12/10/20 0020 12/10/20 0320 12/10/20 0719  GLUCAP 164* 163* 140*       Critical care time: 73 minutes    Freda Jackson, MD [facility] Office: [phone]   See Amion for personal pager PCCM on call pager [phone] until 7pm. Please call Elink 7p-7a. [phone]

## 2020-12-10 NOTE — Discharge Instructions (Signed)
Kidney Stones Nutrition Therapy Diet can influence the formation and growth of kidney stones. Using diet to prevent kidney stones may mean changing what you eat. Depending on your individual risk factors and on the type of kidney stones you form, you may be advised to make changes. Nutrition therapy, as with other forms of therapy, is prescribed individually according to you and your specific needs and risks. Your registered dietitian (RD) will design and explain a plan that meets your needs. Risk factors for kidney stones vary between individuals and can even change within an individual over time. You probably won't need to follow everything on this handout. Your RD will review and explain the items that are important for your stone prevention. Your risk factors for kidney stone formation and growth are: ___ High urine calcium                  ___ Low urine volume                                  ___ Low calcium intake ___ High urine uric acid                 ___ Low urine magnesium                          ___ High salt intake ___ High urine oxalate                   ___ Low urine citrate                                    ___ High calcium intake ___ High acid urine                        ___ Low fruit and vegetable intake             ___ Acid load of diet is high  Your Nutrition Therapy Checklist Therapies that apply to you are checked.  ___ Increase your urine volume by drinking more fluids (usually 2 liters of urine or more is the goal) . Drinking at least 3 liters (at least 3 quarts or 100 ounces) of fluids per day is the best way to lower your risk for forming new stones. You may have to drink more than this if you exercise heavily or are in hot weather for long periods of time. Marland Kitchen Spread your fluid intake throughout the day and night. All drinks count toward your fluid intake, including water, fruit juice, coffee, tea, milk, soda, and lemonade. . Drink more low-sugar, low-calorie beverages so  you do not get too many of your calories from beverages. . Drink beer, wine, and alcoholic spirits in moderation, if at all. HERE'S A TIP--Divide your day into 3 parts. Plan to drink at least 32 ounces in each part of the day. This will get you close to at least 100 ounces of fluids for the day.    ___ Reduce urine calcium by reducing your salt intake Reducing sodium (salt) intake is a powerful way to reduce urine calcium. Most diets contain too much sodium. This may increase the amount of calcium your kidneys let out into the urine. Believe it or not, only about 10% of our salt intake comes  from the salt shaker! The rest comes mostly from processed and prepared foods. The sodium in your 24-hour urine collection is a good estimate of your intake. Based on your urine, your intake appears to add to your urine calcium. The recommended amount of sodium is 1,500 milligrams per day. Most people get more than this even if they don't use the salt shaker. Start reducing salt by just eating less than you do now. Choose "no salt added" or "low-salt" foods as much as possible. Particularly high-salt foods to limit include: Cheese (all types) Most frozen foods and meals Salty, cured meats and deli meats  Hot dogs, bratwurst, and sausages Canned soups and vegetables Breads, bagels, rolls, and baked goods  Salty snacks (chips, pretzels) Certain salad dressings Certain breakfast cereals  Pickles and olives Casseroles and other "mixed" foods Pizza, lasagna  Canned and bottled pasta sauces Certain condiments Table salt and some spice blends   ___ Reduce urine calcium by balancing your diet Balancing your diet for acid is important if your urine calcium is high. A high "acid load" diet may cause your bones to release more calcium into the bloodstream than they should. This can add to high urine calcium. The foods causing the highest acid load are: . Cheese . Meats of all types . Fish and seafood . Poultry Reduce  the number of times you eat these foods in a week and eat smaller portions of these foods. Foods in the grain group--breads, cereals, rice, and pasta--also add to the acid load of the diet, but not as much as cheese, meat, poultry, and fish. Fewer grain foods, or smaller portions, may help reduce urine calcium. Milk and yogurt do not need to be restricted unless otherwise recommended. Nearly all fruits and vegetables have the opposite effect of acid. Eating more of these will help balance your diet against the acidic effects of meats and cheese.  ___ Reduce urine oxalate: 4 strategies There are different ways to lower the amount of oxalate in your urine. How you do so will depend on why your urine oxalate levels are high. It is unlikely that all the strategies below apply to you. Those that are circled apply to you most. A. Eat or drink something with about 300 milligrams of calcium at each meal and snack. WHY? Calcium and oxalate bind in the digestive tract and get removed in the stool.  Less oxalate is absorbed and available to the urine. Foods and beverages are best  for including calcium; non-dairy sources are available for those who may be lactose intolerant or who otherwise avoid or limit dairy. Calcium supplements may be included, but you should not have more than 1,200 to 1,500 milligrams of calcium a day. (If you have short bowel or malabsorption, you may be advised to use more calcium.) B. Eat fewer "high-oxalate" foods. WHY? The more oxalate absorbed from your digestive tract, the more in your urine. High-oxalate foods to limit, if you eat them, are: . Spinach . Rhubarb . Beets . Potato chips . [place] fries . Nuts and nut butters  You do not need to cut out other healthy foods that provide some oxalate. In fact, oxalate is practically unavoidable, because most plant foods have some. Often a combination of calcium from foods or beverages with meals and fewer high-oxalate foods is  required.  C. Stop taking vitamin C supplements.  WHY? When the body gets more vitamin C than it needs, some of the vitamin C breaks down into oxalate.  The oxalate then goes to the kidneys and into the urine. You do not need to limit fruits and vegetables with vitamin C.  D. Increase the amount of oxalate-eating bacteria in your digestive tract.  WHY? If you've ever been on an antibiotic, especially recently, or if you've had part of your digestive tract removed, you are at special risk of not having enough good bacteria (also known as probiotics). Many different bacteria eat oxalate, which reduces the amount of oxalate that is absorbed and reduces oxalate in urine. Eating foods with live cultures, such as yogurt and kefir, may help, but you may need a large dose of probiotics. Start a daily probiotic supplement. Choose one with at least 3, preferably more, different strains of bacteria to increase the chance that some of them will lower the level of oxalate. In certain cases, vitamin B-6 and/or fish oil supplements are recommended.  ___ Reduce urine acidity (especially relevant for those who form uric acid or cystine stones) Sometimes, only medication can address acidic urine effectively. But you can decrease the acidity of your urine by increasing your intake of fruits and vegetables, which provide potassium and other compounds that alkalinize your urine. You can also reduce the amount of foods you eat that contribute directly to acidity, and these include cheese, meats, fish, and poultry.   ___ Reduce urine uric acid: 2 ways 1. All meats of all types, including poultry and fish, contribute to uric acid production in the body. You are advised to eat fewer of these foods within a week and/or to eat smaller portions. Organ meats, water fowl, game meats, and certain types of seafood (anchovies, sardines, herring) are especially high in purines. When purines are consumed, they increase uric acid levels.  HERE ARE SOME TIPS FOR REDUCING PURINES: o Make 2 or more days a week "non-meat" days (you may use dairy and eat non-meat protein foods like beans). o Limit yourself to one serving per day of meat, fish, poultry or seafood. o Limit portion sizes of meat, fish, poultry, or seafood to no more than one-quarter of your plate (or 3 to 4 ounces by weight). 2. There are other potential contributors to high urine uric acid. If applicable and depending on your current intake, you may be advised to reduce your consumption of alcohol and/or of fructose, a carbohydrate found naturally in moderate amounts in fruits but used in large amounts in processed foods.  ___ Increase urine citrate If your citrate is very low, a medication may be prescribed to help increase it. Otherwise, and in addition to medication, eating more fruits and vegetables--at least 5 a day--and choosing beverages high in citric acid can increase your urine citrate. Pure lemon juice (not lemonade) and lime juice are very rich in citric acid. You may try using 2 ounces of lemon or lime juice diluted in water or other beverage twice daily. Beverages with citric acid in them include some diet sodas and powdered drink mixes.  ___ INCREASE URINE MAGNESIUM If your urine magnesium is low, you are advised to take a magnesium supplement, which is available over-the-counter. Your registered dietitian will specify how much magnesium you need; you may require a formulation providing in the range of 300 to 500 milligrams per day, depending on your current intake of foods providing magnesium.  Corrin Parker, MS, RD, LDN Clinical Dietitian Office phone [phone]

## 2020-12-10 NOTE — Progress Notes (Signed)
Initial Nutrition Assessment  DOCUMENTATION CODES:   Not applicable  INTERVENTION:  Provide Boost Breeze po TID, each supplement provides 250 kcal and 9 grams of protein.  Encourage adequate PO intake.   Diet education handout regarding nephrolithiasis given.   NUTRITION DIAGNOSIS:   Increased nutrient needs related to acute illness (septic shock) as evidenced by estimated needs.  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 Pt with AKI and bilateral nephrolithiasis.  Pt with history of aphasia. RD unable to obtain pt nutrition history at this time. Diet has just been advanced to a clear liquid diet. RD to order nutritional supplements to aid in caloric and protein needs. Pt with no weight loss per weight records. RD consulted for diet education regarding diet education on foods to avoid for nephrolithiasis. Diet handout "Kidney stones Nutrition therapy" from the [employer] Manual placed in pt discharge instruction. Unable to complete Nutrition-Focused physical exam at this time.  Labs and medications reviewed.   Diet Order:   Diet Order            Diet clear liquid Room service appropriate? Yes; Fluid consistency: Thin  Diet effective now                 EDUCATION NEEDS:   Education needs have been addressed  Skin:  Skin Assessment: Reviewed RN Assessment  Last BM:  5/14  Height:   Ht Readings from Last 1 Encounters:  12/09/20 5\' 7"  (1.702 m)    Weight:   Wt Readings from Last 1 Encounters:  12/10/20 70.9 kg   BMI:   Body mass index is 24.48 kg/m.  Estimated Nutritional Needs:   Kcal:  1900-2100  Protein:  90-105 grams  Fluid:  >/= 1.9 L/day  Corrin Parker, MS, RD, LDN RD pager number/after hours weekend pager number on Am[employer].

## 2020-12-10 NOTE — ED Notes (Signed)
Attempted report at this time.

## 2020-12-10 NOTE — Consult Note (Signed)
Urology Consult   Physician requesting consult: Thamas Jaegers, MD  Reason for consult: Obstructing right ureteral calculus  History of Present Illness: Melanie Madden is a 55 y.o. female with a history of kidney stones, CVA with right hemiparesis and aphasia.  She presented to the [facility] emergency department on 12/09/2020 with altered mental status following a syncopal episode.  CT revealed an obstructing 8 mm right proximal ureteral calculus with mild to moderate right-sided hydronephrosis and signs of emphysematous pyelitis.  Blood pressure in the ER was 60 over palp she was found to be profoundly tachycardic.  Interventional radiology was consulted and a right-sided nephrostomy tube was placed.    Her husband, who is at bedside, reports no complaints of flank pain, dysuria or hematuria.  She does have a remote history of kidney stones and required ESWL and ureteroscopy in the past.  Past Medical History:  Diagnosis Date  . Allergy   . Anxiety   . Arthritis   . High triglycerides   . Kidney stones   . Migraines   . Recurrent sinus infections     Past Surgical History:  Procedure Laterality Date  . ANTERIOR CRUCIATE LIGAMENT REPAIR Left   . BUNIONECTOMY Right   . IR NEPHROSTOMY PLACEMENT RIGHT  12/09/2020  . LASIK    . LITHOTRIPSY    . MENISCUS REPAIR Left   . NASAL SEPTUM SURGERY    . SHOULDER SURGERY    . VARICOSE VEIN SURGERY Left     Current Hospital Medications:  Home Meds:  Current Meds  Medication Sig  . amLODipine (NORVASC) 10 MG tablet Take 10 mg by mouth daily.  Marland Kitchen atorvastatin (LIPITOR) 20 MG tablet Take 1 tablet (20 mg total) by mouth daily at 6 PM.  . B Complex-C (B-COMPLEX WITH VITAMIN C) tablet Take 1 tablet by mouth daily.  Marland Kitchen CALCIUM PO Take 1 tablet by mouth daily.  . cholecalciferol (VITAMIN D) 1000 units tablet Take 1,000 Units by mouth daily. 2,000 units daily  . docusate sodium (COLACE) 100 MG capsule Take 300 mg by mouth daily.  .  magnesium 30 MG tablet Take 30 mg by mouth daily.  . Omega-3 Fatty Acids (FISH OIL) 1200 MG CPDR Take 1 capsule by mouth daily.  . QUEtiapine (SEROQUEL) 50 MG tablet Take 1 tablet (50 mg total) by mouth 2 (two) times daily.  . sertraline (ZOLOFT) 50 MG tablet Take 1 tablet (50 mg total) by mouth daily.  Marland Kitchen VITAMIN E PO Take 1 tablet by mouth daily.   Marland Kitchen zinc sulfate 220 (50 Zn) MG capsule Take 220 mg by mouth daily.    Scheduled Meds: . Chlorhexidine Gluconate Cloth  6 each Topical Daily  . feeding supplement  1 Container Oral TID BM  . insulin aspart  0-6 Units Subcutaneous Q4H  . lidocaine-EPINEPHrine  10 mL Intradermal Once  . mouth rinse  15 mL Mouth Rinse BID  . sodium chloride flush  10-40 mL Intracatheter Q12H   Continuous Infusions: . lactated ringers 150 mL/hr at 12/10/20 1126  . norepinephrine (LEVOPHED) Adult infusion 8 mcg/min (12/10/20 1126)  . piperacillin-tazobactam (ZOSYN)  IV Stopped (12/10/20 1036)   PRN Meds:.docusate sodium, fentaNYL (SUBLIMAZE) injection, iohexol, oxyCODONE, polyethylene glycol, sodium chloride flush  Allergies:  Allergies  Allergen Reactions  . Amoxicillin Nausea Only  . Shellfish Allergy Diarrhea and Nausea And Vomiting    Mussels     Family History  Problem Relation Age of Onset  . Alzheimer's disease Mother   .  Hypertension Mother   . Diabetes Mother   . Thyroid disease Mother   . Deep vein thrombosis Maternal Grandmother   . Colon cancer Neg Hx   . Colon polyps Neg Hx   . Esophageal cancer Neg Hx   . Pancreatic cancer Neg Hx   . Rectal cancer Neg Hx   . Stomach cancer Neg Hx   . Breast cancer Neg Hx     Social History:  reports that she has quit smoking. She has never used smokeless tobacco. She reports current alcohol use. She reports that she does not use drugs.  ROS: A complete review of systems was performed.  All systems are negative except for pertinent findings as noted.  Physical Exam:  Vital signs in last 24  hours: Temp:  [97.6 F (36.4 C)-99.2 F (37.3 C)] 98.9 F (37.2 C) (05/14 0715) Pulse Rate:  [102-118] 106 (05/14 1100) Resp:  [0-35] 17 (05/14 1100) BP: (65-114)/(37-84) 99/75 (05/14 1100) SpO2:  [86 %-100 %] 95 % (05/14 1100) Weight:  [63.3 kg-70.9 kg] 70.9 kg (05/14 0100) Constitutional:  Alert and oriented, No acute distress Cardiovascular: Regular rate and rhythm, No JVD Respiratory: Normal respiratory effort, Lungs clear bilaterally GI: Abdomen is soft, nontender, nondistended, no abdominal masses GU: Right percutaneous nephrostomy tube in place and draining amber urine Lymphatic: No lymphadenopathy Neurologic: Grossly intact, no focal deficits Psychiatric: Normal mood and affect  Laboratory Data:  Recent Labs    12/09/20 1748 12/10/20 0559  WBC 15.0* 22.4*  HGB 12.7 12.7  HCT 38.7 39.1  PLT PLATELET CLUMPS NOTED ON SMEAR, UNABLE TO ESTIMATE 65*    Recent Labs    12/09/20 1748 12/10/20 0559  NA 136 133*  K 3.0* 4.4  CL 104 103  GLUCOSE 153* 177*  BUN 43* 50*  CALCIUM 9.6 8.2*  CREATININE 3.36* 3.19*     Results for orders placed or performed during the hospital encounter of 12/09/20 (from the past 24 hour(s))  Comprehensive metabolic panel     Status: Abnormal   Collection Time: 12/09/20  5:48 PM  Result Value Ref Range   Sodium 136 135 - 145 mmol/L   Potassium 3.0 (L) 3.5 - 5.1 mmol/L   Chloride 104 98 - 111 mmol/L   CO2 20 (L) 22 - 32 mmol/L   Glucose, Bld 153 (H) 70 - 99 mg/dL   BUN 43 (H) 6 - 20 mg/dL   Creatinine, Ser 3.36 (H) 0.44 - 1.00 mg/dL   Calcium 9.6 8.9 - 10.3 mg/dL   Total Protein 5.4 (L) 6.5 - 8.1 g/dL   Albumin 2.7 (L) 3.5 - 5.0 g/dL   AST 38 15 - 41 U/L   ALT 35 0 - 44 U/L   Alkaline Phosphatase 128 (H) 38 - 126 U/L   Total Bilirubin 1.1 0.3 - 1.2 mg/dL   GFR, Estimated 16 (L) >60 mL/min   Anion gap 12 5 - 15  CBC with Differential     Status: Abnormal   Collection Time: 12/09/20  5:48 PM  Result Value Ref Range   WBC 15.0 (H)  4.0 - 10.5 K/uL   RBC 4.19 3.87 - 5.11 MIL/uL   Hemoglobin 12.7 12.0 - 15.0 g/dL   HCT 38.7 36.0 - 46.0 %   MCV 92.4 80.0 - 100.0 fL   MCH 30.3 26.0 - 34.0 pg   MCHC 32.8 30.0 - 36.0 g/dL   RDW 14.2 11.5 - 15.5 %   Platelets PLATELET CLUMPS NOTED ON SMEAR, UNABLE TO  ESTIMATE 150 - 400 K/uL   nRBC 0.0 0.0 - 0.2 %   Neutrophils Relative % 92 %   Neutro Abs 13.8 (H) 1.7 - 7.7 K/uL   Lymphocytes Relative 3 %   Lymphs Abs 0.5 (L) 0.7 - 4.0 K/uL   Monocytes Relative 5 %   Monocytes Absolute 0.8 0.1 - 1.0 K/uL   Eosinophils Relative 0 %   Eosinophils Absolute 0.0 0.0 - 0.5 K/uL   Basophils Relative 0 %   Basophils Absolute 0.0 0.0 - 0.1 K/uL   WBC Morphology INCREASED BANDS (>20% BANDS)    Abs Immature Granulocytes 0.00 0.00 - 0.07 K/uL   Dohle Bodies PRESENT   Troponin I (High Sensitivity)     Status: Abnormal   Collection Time: 12/09/20  5:48 PM  Result Value Ref Range   Troponin I (High Sensitivity) 63 (H) <18 ng/L  Lactic acid, plasma     Status: Abnormal   Collection Time: 12/09/20  5:49 PM  Result Value Ref Range   Lactic Acid, Venous 5.6 (HH) 0.5 - 1.9 mmol/L  Urinalysis, Routine w reflex microscopic Urine, Catheterized     Status: Abnormal   Collection Time: 12/09/20  5:49 PM  Result Value Ref Range   Color, Urine YELLOW YELLOW   APPearance TURBID (A) CLEAR   Specific Gravity, Urine 1.016 1.005 - 1.030   pH 5.0 5.0 - 8.0   Glucose, UA NEGATIVE NEGATIVE mg/dL   Hgb urine dipstick LARGE (A) NEGATIVE   Bilirubin Urine NEGATIVE NEGATIVE   Ketones, ur NEGATIVE NEGATIVE mg/dL   Protein, ur 100 (A) NEGATIVE mg/dL   Nitrite NEGATIVE NEGATIVE   Leukocytes,Ua MODERATE (A) NEGATIVE   RBC / HPF >50 (H) 0 - 5 RBC/hpf   WBC, UA >50 (H) 0 - 5 WBC/hpf   Bacteria, UA MANY (A) NONE SEEN   Squamous Epithelial / LPF 0-5 0 - 5   WBC Clumps PRESENT    Mucus PRESENT    Non Squamous Epithelial 11-20 (A) NONE SEEN  Lactic acid, plasma     Status: Abnormal   Collection Time: 12/09/20   7:49 PM  Result Value Ref Range   Lactic Acid, Venous 2.8 (HH) 0.5 - 1.9 mmol/L  Troponin I (High Sensitivity)     Status: Abnormal   Collection Time: 12/09/20  7:49 PM  Result Value Ref Range   Troponin I (High Sensitivity) 44 (H) <18 ng/L  Culture, blood (routine x 2)     Status: Abnormal (Preliminary result)   Collection Time: 12/09/20  9:26 PM   Specimen: BLOOD  Result Value Ref Range   Specimen Description BLOOD SITE NOT SPECIFIED    Special Requests      BOTTLES DRAWN AEROBIC AND ANAEROBIC Blood Culture results may not be optimal due to an inadequate volume of blood received in culture bottles   Culture  Setup Time (A)     GRAM VARIABLE ROD IN BOTH AEROBIC AND ANAEROBIC BOTTLES CRITICAL RESULT CALLED TO, READ BACK BY AND VERIFIED WITH: [place]. [id number] FCP Performed at [facility], [address], [place], [place] [address]    Culture GRAM NEGATIVE RODS    Report Status PENDING   Blood Culture ID Panel (Reflexed)     Status: Abnormal   Collection Time: 12/09/20  9:26 PM  Result Value Ref Range   Enterococcus faecalis NOT DETECTED NOT DETECTED   Enterococcus Faecium NOT DETECTED NOT DETECTED   Listeria monocytogenes NOT DETECTED NOT DETECTED  Staphylococcus species NOT DETECTED NOT DETECTED   Staphylococcus aureus (BCID) NOT DETECTED NOT DETECTED   Staphylococcus epidermidis NOT DETECTED NOT DETECTED   Staphylococcus lugdunensis NOT DETECTED NOT DETECTED   Streptococcus species NOT DETECTED NOT DETECTED   Streptococcus agalactiae NOT DETECTED NOT DETECTED   Streptococcus pneumoniae NOT DETECTED NOT DETECTED   Streptococcus pyogenes NOT DETECTED NOT DETECTED   A.calcoaceticus-baumannii NOT DETECTED NOT DETECTED   Bacteroides fragilis NOT DETECTED NOT DETECTED   Enterobacterales DETECTED (A) NOT DETECTED   Enterobacter cloacae complex NOT DETECTED NOT DETECTED   Escherichia coli DETECTED (A) NOT DETECTED   Klebsiella aerogenes NOT DETECTED NOT DETECTED    Klebsiella oxytoca NOT DETECTED NOT DETECTED   Klebsiella pneumoniae NOT DETECTED NOT DETECTED   Proteus species NOT DETECTED NOT DETECTED   Salmonella species NOT DETECTED NOT DETECTED   Serratia marcescens NOT DETECTED NOT DETECTED   Haemophilus influenzae NOT DETECTED NOT DETECTED   Neisseria meningitidis NOT DETECTED NOT DETECTED   Pseudomonas aeruginosa NOT DETECTED NOT DETECTED   Stenotrophomonas maltophilia NOT DETECTED NOT DETECTED   Candida albicans NOT DETECTED NOT DETECTED   Candida auris NOT DETECTED NOT DETECTED   Candida glabrata NOT DETECTED NOT DETECTED   Candida krusei NOT DETECTED NOT DETECTED   Candida parapsilosis NOT DETECTED NOT DETECTED   Candida tropicalis NOT DETECTED NOT DETECTED   Cryptococcus neoformans/gattii NOT DETECTED NOT DETECTED   CTX-M ESBL NOT DETECTED NOT DETECTED   Carbapenem resistance IMP NOT DETECTED NOT DETECTED   Carbapenem resistance KPC NOT DETECTED NOT DETECTED   Carbapenem resistance NDM NOT DETECTED NOT DETECTED   Carbapenem resist OXA 48 LIKE NOT DETECTED NOT DETECTED   Carbapenem resistance VIM NOT DETECTED NOT DETECTED  Resp Panel by RT-PCR (Flu A&B, Covid) Nasopharyngeal Swab     Status: None   Collection Time: 12/09/20  9:56 PM   Specimen: Nasopharyngeal Swab; Nasopharyngeal(NP) swabs in vial transport medium  Result Value Ref Range   SARS Coronavirus 2 by RT PCR NEGATIVE NEGATIVE   Influenza A by PCR NEGATIVE NEGATIVE   Influenza B by PCR NEGATIVE NEGATIVE  HIV Antibody (routine testing w rflx)     Status: None   Collection Time: 12/09/20 11:36 PM  Result Value Ref Range   HIV Screen 4th Generation wRfx Non Reactive Non Reactive  Procalcitonin - Baseline     Status: None   Collection Time: 12/09/20 11:36 PM  Result Value Ref Range   Procalcitonin 99.04 ng/mL  CBG monitoring, ED     Status: Abnormal   Collection Time: 12/10/20 12:20 AM  Result Value Ref Range   Glucose-Capillary 164 (H) 70 - 99 mg/dL  MRSA PCR Screening      Status: None   Collection Time: 12/10/20  1:11 AM   Specimen: Nasopharyngeal  Result Value Ref Range   MRSA by PCR NEGATIVE NEGATIVE  Glucose, capillary     Status: Abnormal   Collection Time: 12/10/20  3:20 AM  Result Value Ref Range   Glucose-Capillary 163 (H) 70 - 99 mg/dL  CBC     Status: Abnormal   Collection Time: 12/10/20  5:59 AM  Result Value Ref Range   WBC 22.4 (H) 4.0 - 10.5 K/uL   RBC 4.26 3.87 - 5.11 MIL/uL   Hemoglobin 12.7 12.0 - 15.0 g/dL   HCT 39.1 36.0 - 46.0 %   MCV 91.8 80.0 - 100.0 fL   MCH 29.8 26.0 - 34.0 pg   MCHC 32.5 30.0 - 36.0 g/dL  RDW 14.6 11.5 - 15.5 %   Platelets 65 (L) 150 - 400 K/uL   nRBC 0.0 0.0 - 0.2 %  Basic metabolic panel     Status: Abnormal   Collection Time: 12/10/20  5:59 AM  Result Value Ref Range   Sodium 133 (L) 135 - 145 mmol/L   Potassium 4.4 3.5 - 5.1 mmol/L   Chloride 103 98 - 111 mmol/L   CO2 17 (L) 22 - 32 mmol/L   Glucose, Bld 177 (H) 70 - 99 mg/dL   BUN 50 (H) 6 - 20 mg/dL   Creatinine, Ser 3.19 (H) 0.44 - 1.00 mg/dL   Calcium 8.2 (L) 8.9 - 10.3 mg/dL   GFR, Estimated 17 (L) >60 mL/min   Anion gap 13 5 - 15  Magnesium     Status: Abnormal   Collection Time: 12/10/20  5:59 AM  Result Value Ref Range   Magnesium 1.6 (L) 1.7 - 2.4 mg/dL  Phosphorus     Status: None   Collection Time: 12/10/20  5:59 AM  Result Value Ref Range   Phosphorus 4.2 2.5 - 4.6 mg/dL  Procalcitonin     Status: None   Collection Time: 12/10/20  5:59 AM  Result Value Ref Range   Procalcitonin 108.90 ng/mL  Protime-INR     Status: Abnormal   Collection Time: 12/10/20  5:59 AM  Result Value Ref Range   Prothrombin Time 20.7 (H) 11.4 - 15.2 seconds   INR 1.8 (H) 0.8 - 1.2  Glucose, capillary     Status: Abnormal   Collection Time: 12/10/20  7:19 AM  Result Value Ref Range   Glucose-Capillary 140 (H) 70 - 99 mg/dL  Glucose, capillary     Status: Abnormal   Collection Time: 12/10/20 12:11 PM  Result Value Ref Range   Glucose-Capillary  136 (H) 70 - 99 mg/dL   Recent Results (from the past 240 hour(s))  Culture, blood (routine x 2)     Status: Abnormal (Preliminary result)   Collection Time: 12/09/20  9:26 PM   Specimen: BLOOD  Result Value Ref Range Status   Specimen Description BLOOD SITE NOT SPECIFIED  Final   Special Requests   Final    BOTTLES DRAWN AEROBIC AND ANAEROBIC Blood Culture results may not be optimal due to an inadequate volume of blood received in culture bottles   Culture  Setup Time (A)  Final    GRAM VARIABLE ROD IN BOTH AEROBIC AND ANAEROBIC BOTTLES CRITICAL RESULT CALLED TO, READ BACK BY AND VERIFIED WITH: [place]. [phone] FCP Performed at [facility], [address] [place], [place] [address]    Culture GRAM NEGATIVE RODS  Final   Report Status PENDING  Incomplete  Blood Culture ID Panel (Reflexed)     Status: Abnormal   Collection Time: 12/09/20  9:26 PM  Result Value Ref Range Status   Enterococcus faecalis NOT DETECTED NOT DETECTED Final   Enterococcus Faecium NOT DETECTED NOT DETECTED Final   Listeria monocytogenes NOT DETECTED NOT DETECTED Final   Staphylococcus species NOT DETECTED NOT DETECTED Final   Staphylococcus aureus (BCID) NOT DETECTED NOT DETECTED Final   Staphylococcus epidermidis NOT DETECTED NOT DETECTED Final   Staphylococcus lugdunensis NOT DETECTED NOT DETECTED Final   Streptococcus species NOT DETECTED NOT DETECTED Final   Streptococcus agalactiae NOT DETECTED NOT DETECTED Final   Streptococcus pneumoniae NOT DETECTED NOT DETECTED Final   Streptococcus pyogenes NOT DETECTED NOT DETECTED Final   A.calcoaceticus-baumannii NOT  DETECTED NOT DETECTED Final   Bacteroides fragilis NOT DETECTED NOT DETECTED Final   Enterobacterales DETECTED (A) NOT DETECTED Final    Comment: Enterobacterales represent a large order of gram negative bacteria, not a single organism. CRITICAL RESULT CALLED TO, READ BACK BY AND VERIFIED WITH: [place] M. [id number]  FCP    Enterobacter cloacae complex NOT DETECTED NOT DETECTED Final   Escherichia coli DETECTED (A) NOT DETECTED Final    Comment: CRITICAL RESULT CALLED TO, READ BACK BY AND VERIFIED WITH: PHARMD JESSICA M. [id number] FCP    Klebsiella aerogenes NOT DETECTED NOT DETECTED Final   Klebsiella oxytoca NOT DETECTED NOT DETECTED Final   Klebsiella pneumoniae NOT DETECTED NOT DETECTED Final   Proteus species NOT DETECTED NOT DETECTED Final   Salmonella species NOT DETECTED NOT DETECTED Final   Serratia marcescens NOT DETECTED NOT DETECTED Final   Haemophilus influenzae NOT DETECTED NOT DETECTED Final   Neisseria meningitidis NOT DETECTED NOT DETECTED Final   Pseudomonas aeruginosa NOT DETECTED NOT DETECTED Final   Stenotrophomonas maltophilia NOT DETECTED NOT DETECTED Final   Candida albicans NOT DETECTED NOT DETECTED Final   Candida auris NOT DETECTED NOT DETECTED Final   Candida glabrata NOT DETECTED NOT DETECTED Final   Candida krusei NOT DETECTED NOT DETECTED Final   Candida parapsilosis NOT DETECTED NOT DETECTED Final   Candida tropicalis NOT DETECTED NOT DETECTED Final   Cryptococcus neoformans/gattii NOT DETECTED NOT DETECTED Final   CTX-M ESBL NOT DETECTED NOT DETECTED Final   Carbapenem resistance IMP NOT DETECTED NOT DETECTED Final   Carbapenem resistance KPC NOT DETECTED NOT DETECTED Final   Carbapenem resistance NDM NOT DETECTED NOT DETECTED Final   Carbapenem resist OXA 48 LIKE NOT DETECTED NOT DETECTED Final   Carbapenem resistance VIM NOT DETECTED NOT DETECTED Final    Comment: Performed at [facility], [address], [place], [place] [address]  Resp Panel by RT-PCR (Flu A&B, Covid) Nasopharyngeal Swab     Status: None   Collection Time: 12/09/20  9:56 PM   Specimen: Nasopharyngeal Swab; Nasopharyngeal(NP) swabs in vial transport medium  Result Value Ref Range Status   SARS Coronavirus 2 by RT PCR NEGATIVE NEGATIVE Final    Comment: (NOTE) SARS-CoV-2 target  nucleic acids are NOT DETECTED.  The SARS-CoV-2 RNA is generally detectable in upper respiratory specimens during the acute phase of infection. The lowest concentration of SARS-CoV-2 viral copies this assay can detect is 138 copies/mL. A negative result does not preclude SARS-Cov-2 infection and should not be used as the sole basis for treatment or other patient management decisions. A negative result may occur with  improper specimen collection/handling, submission of specimen other than nasopharyngeal swab, presence of viral mutation(s) within the areas targeted by this assay, and inadequate number of viral copies(<138 copies/mL). A negative result must be combined with clinical observations, patient history, and epidemiological information. The expected result is Negative.  Fact Sheet for Patients:  EntrepreneurPulse.com.au  Fact Sheet for Healthcare Providers:  IncredibleEmployment.be  This test is no t yet approved or cleared by the [place] FDA and  has been authorized for detection and/or diagnosis of SARS-CoV-2 by FDA under an Emergency Use Authorization (EUA). This EUA will remain  in effect (meaning this test can be used) for the duration of the COVID-19 declaration under Section 564(b)(1) of the Act, 21 U.S.C.section 360bbb-3(b)(1), unless the authorization is terminated  or revoked sooner.       Influenza A by PCR NEGATIVE NEGATIVE  Final   Influenza B by PCR NEGATIVE NEGATIVE Final    Comment: (NOTE) The Xpert Xpress SARS-CoV-2/FLU/RSV plus assay is intended as an aid in the diagnosis of influenza from Nasopharyngeal swab specimens and should not be used as a sole basis for treatment. Nasal washings and aspirates are unacceptable for Xpert Xpress SARS-CoV-2/FLU/RSV testing.  Fact Sheet for Patients: EntrepreneurPulse.com.au  Fact Sheet for Healthcare  Providers: IncredibleEmployment.be  This test is not yet approved or cleared by the [place] FDA and has been authorized for detection and/or diagnosis of SARS-CoV-2 by FDA under an Emergency Use Authorization (EUA). This EUA will remain in effect (meaning this test can be used) for the duration of the COVID-19 declaration under Section 564(b)(1) of the Act, 21 U.S.C. section 360bbb-3(b)(1), unless the authorization is terminated or revoked.  Performed at [facility], [place] [address], [place], [place] [address]   MRSA PCR Screening     Status: None   Collection Time: 12/10/20  1:11 AM   Specimen: Nasopharyngeal  Result Value Ref Range Status   MRSA by PCR NEGATIVE NEGATIVE Final    Comment:        The GeneXpert MRSA Assay (FDA approved for NASAL specimens only), is one component of a comprehensive MRSA colonization surveillance program. It is not intended to diagnose MRSA infection nor to guide or monitor treatment for MRSA infections. Performed at [facility], [place] [address], [place],  [address]     Renal Function: Recent Labs    12/09/20 1748 12/10/20 0559  CREATININE 3.36* 3.19*   Estimated Creatinine Clearance: 19.4 mL/min (A) (by C-G formula based on SCr of 3.19 mg/dL (H)).  Radiologic Imaging: DG Chest Port 1 View  Result Date: 12/09/2020 CLINICAL DATA:  Central line placement. EXAM: PORTABLE CHEST 1 VIEW COMPARISON:  Chest x-ray from same day at 1853 hours. FINDINGS: New right internal jugular central venous catheter with tip near the cavoatrial junction. The heart size and mediastinal contours are within normal limits. Mild bibasilar atelectasis. No focal consolidation, pleural effusion, or pneumothorax. No acute osseous abnormality. IMPRESSION: 1. New right internal jugular central venous catheter without complicating feature. Electronically Signed   By: Titus Dubin M.D.   On: 12/09/2020 20:34   DG Chest Port 1  View  Result Date: 12/09/2020 CLINICAL DATA:  Syncope EXAM: PORTABLE CHEST 1 VIEW COMPARISON:  12/19/2019 FINDINGS: The heart size and mediastinal contours are within normal limits. Both lungs are clear. Inferior subluxation of right humeral head as before IMPRESSION: No active disease. Electronically Signed   By: Donavan Foil M.D.   On: 12/09/2020 19:17   CT Renal Stone Study  Result Date: 12/09/2020 CLINICAL DATA:  Sepsis.  History of obstructive renal calculi EXAM: CT ABDOMEN AND PELVIS WITHOUT CONTRAST TECHNIQUE: Multidetector CT imaging of the abdomen and pelvis was performed following the standard protocol without oral or IV contrast. COMPARISON:  None. FINDINGS: Lower chest: There is ill-defined patchy opacity in the lung bases primarily due to atelectasis. Hepatobiliary: No focal liver lesions are appreciable on this noncontrast enhanced study. The gallbladder is mildly distended with apparent sludge within the gallbladder. No appreciable biliary duct dilatation. Pancreas: There is no evident pancreatic mass or inflammatory focus. Spleen: Probable small granuloma in the spleen. Spleen otherwise appears unremarkable. Adrenals/Urinary Tract: Right adrenal appears normal. 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 Question left adrenal cyst versus adenoma. The attenuation of this mass is in the cystic range. The right  kidney is edematous. There are small areas of air within the right kidney, consistent with emphysematous pyelitis. There is mild hydronephrosis on the right. There is no appreciable hydronephrosis on the left. No air seen within the left kidney/collecting system. No renal masses. There are scattered 1-2 mm calculi throughout the left kidney. There is a 4 x 2 mm calculus in the lower pole left kidney. There are scattered 1-2 mm calculi throughout the right kidney. There are two adjacent 3 mm calculi in the mid to lower right kidney with nearby 1-2 mm calculi. There is a calculus in  the mid to lower right kidney occupying a calyx measuring 1.8 x 0.8 cm. There is a calculus with immediately adjacent air at the right ureteropelvic junction measuring 8 x 6 mm. No other ureteral calculi are evident. Urinary bladder is midline with wall thickness overall increased. Stomach/Bowel: Stomach filled with fluid. Gastric wall not thickened. No small or large bowel wall thickening evident. No appreciable bowel obstruction. Terminal ileum appears unremarkable. There are several calcifications within the appendix. Appendix is normal in size and contour without appendiceal wall thickening. There is no pneumoperitoneum or portal venous air. Vascular/Lymphatic: Foci of aortic atherosclerosis. No aneurysm of the aorta evident. No adenopathy in the abdomen or pelvis. Reproductive: Uterus anteverted. No uterine or adnexal masses are evident. Other: There is a small amount of ascites in the dependent portion of the pelvis. No abscess in the abdomen or pelvis. Musculoskeletal: Degenerative change in the lower thoracic and lumbar regions. There is lumbar levoscoliosis. No blastic or lytic bone lesions. No intramuscular or abdominal wall lesions. IMPRESSION: 1. Foci of air within right renal collecting system as well as at the right ureteropelvic junction consistent with a degree of emphysematous pyelitis. Mild hydronephrosis on the right. There is an 8 x 6 calculus with immediately adjacent air at the right ureteropelvic junction. No other ureteral calculi. Note that right kidney appears edematous. 2. Multiple calculi in each kidney, nonobstructing as noted above. Largest of these calculi measures 1.8 x 0.8 cm in a lower pole calyx on the right. 3. No appreciable bowel wall or mesenteric thickening. No bowel obstruction. No abscess in the abdomen or pelvis. Appendix normal in size and contour without inflammation. There are calcifications within the appendix. 4.  Small amount of ascites in dependent portion pelvis. 5.  Gallbladder is rather distended and contains sludge. This finding may warrant gallbladder ultrasound to further evaluate. 6.  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 7. Probable atelectasis in the lung bases. Mild superimposed pneumonia in the bases is difficult to exclude in this circumstance. 8.  Aortic Atherosclerosis (ICD10-I70.0). Electronically Signed   By: Lowella Grip III M.D.   On: 12/09/2020 21:04   IR NEPHROSTOMY PLACEMENT RIGHT  Result Date: 12/10/2020 INDICATION: History of nephrolithiasis, now admitted with urosepsis. Please perform image guided placement of right-sided nephrostomy catheter for infection source control purposes. EXAM: ULTRASOUND AND FLUOROSCOPIC GUIDED PLACEMENT OF RIGHT NEPHROSTOMY TUBE COMPARISON:  CT abdomen pelvis-12/09/2020 MEDICATIONS: Ciprofloxacin 400 mg IV; The antibiotic was administered in an appropriate time frame prior to skin puncture. ANESTHESIA/SEDATION: None CONTRAST:  10 mL Isovue 300 - administered into the renal collecting system FLUOROSCOPY TIME:  3 minutes, 6 seconds (25 mGy) COMPLICATIONS: None immediate. PROCEDURE: The procedure, risks, benefits, and alternatives were explained to the patient's husband, questions were encouraged and answered and informed consent was obtained. A timeout was performed prior to the initiation of the procedure. The operative site  was prepped and draped in the usual sterile fashion and a sterile drape was applied covering the operative field. A sterile gown and sterile gloves were used for the procedure. Local anesthesia was provided with 1% Lidocaine with epinephrine. Ultrasound was used to localize the left kidney. As there is no significant dilatation of the inferior pole renal calices, under direct ultrasound guidance, a 20 gauge needle was advanced into the renal collecting system a very mildly dilated posterior midpole calyx. An ultrasound image documentation was performed. Access within the collecting  system was confirmed with the efflux of urine followed by limited contrast injection. Under intermittent fluoroscopic guidance, an 0.018 wire was advanced into the collecting system and the tract was dilated with an Accustick stent. Next, over a short Amplatz wire, the track was further dilated ultimately allowing placement of a 10-French percutaneous nephrostomy catheter with end coiled and locked within the renal pelvis. Contrast was injected and several spot fluoroscopic images were obtained in various obliquities. The catheter was secured at the skin entrance site with an interrupted suture and a stat lock device and connected to a gravity bag. Dressings were applied. The patient tolerated procedure well without immediate postprocedural complication. FINDINGS: Ultrasound scanning demonstrates very mild left-sided pelvicaliectasis as was demonstrated on preceding noncontrast abdominal CT. As there is no significant dilatation of the inferior pole renal calices, a very mildly dilated posterior midpole calyx was targeted under direct ultrasound guidance allowing access to the left renal collecting system. Next, under fluoroscopic guidance, a 10-French percutaneous nephrostomy catheter was placed with end coiled and locked within the renal pelvis. Contrast injection confirmed appropriate positioning. IMPRESSION: Successful ultrasound and fluoroscopic guided placement of a left sided 10 French PCN. Electronically Signed   By: Sandi Mariscal M.D.   On: 12/10/2020 08:10   US Abdomen Limited RUQ (LIVER/GB)  Result Date: 12/10/2020 CLINICAL DATA:  Sludge in the gallbladder EXAM: ULTRASOUND ABDOMEN LIMITED RIGHT UPPER QUADRANT COMPARISON:  CT abdomen pelvis 12/09/2020 FINDINGS: Gallbladder: No gallstones or wall thickening visualized. Small amount of sludge in the gallbladder. No sonographic Murphy sign noted by sonographer. Common bile duct: Diameter: 0.5 cm, within normal limits Liver: No focal lesion identified.  Within normal limits in parenchymal echogenicity. Portal vein is patent on color Doppler imaging with normal direction of blood flow towards the liver. Other: None. IMPRESSION: 1. Mild amount of sludge in the gallbladder. Otherwise unremarkable. 2.  Normal sonographic appearance of the liver. Electronically Signed   By: Audie Pinto M.D.   On: 12/10/2020 10:54    I independently reviewed the above imaging studies.  Impression/Recommendation 55 year old female with an obstructing 8 mm right UPJ calculus resulting in severe sepsis.  History of CVA with right hemiparesis and aphasia  -Blood and urine cultures are pending.  Continue broad-spectrum antibiotics -Continue IV fluid resuscitation -I will arrange outpatient follow-up to plan definitive stone treatment in approximately 2 to 3 weeks once her infection is cleared  Ellison Hughs, [facility] 12/10/2020, 12:51 PM

## 2020-12-10 NOTE — Progress Notes (Signed)
PHARMACY - PHYSICIAN COMMUNICATION CRITICAL VALUE ALERT - BLOOD CULTURE IDENTIFICATION (BCID)  Melanie Madden [name] is an 55 y.o. female who presented to [facility] on 12/09/2020 with a chief complaint of syncope. 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 BCID came back with [facility] - no resistance detected.   Name of physician (or Provider) Contacted: Dr. Erin Fulling  Current antibiotics: Zosyn   Changes to prescribed antibiotics recommended:  Recommendations accepted by provider - Narrowed to Ceftriaxone 2g IV every 24 hours.   Results for orders placed or performed during the hospital encounter of 12/09/20  Blood Culture ID Panel (Reflexed) (Collected: 12/09/2020  9:26 PM)  Result Value Ref Range   Enterococcus faecalis NOT DETECTED NOT DETECTED   Enterococcus Faecium NOT DETECTED NOT DETECTED   Listeria monocytogenes NOT DETECTED NOT DETECTED   Staphylococcus species NOT DETECTED NOT DETECTED   Staphylococcus aureus (BCID) NOT DETECTED NOT DETECTED   Staphylococcus epidermidis NOT DETECTED NOT DETECTED   Staphylococcus lugdunensis NOT DETECTED NOT DETECTED   Streptococcus species NOT DETECTED NOT DETECTED   Streptococcus agalactiae NOT DETECTED NOT DETECTED   Streptococcus pneumoniae NOT DETECTED NOT DETECTED   Streptococcus pyogenes NOT DETECTED NOT DETECTED   A.calcoaceticus-baumannii NOT DETECTED NOT DETECTED   Bacteroides fragilis NOT DETECTED NOT DETECTED   Enterobacterales DETECTED (A) NOT DETECTED   Enterobacter cloacae complex NOT DETECTED NOT DETECTED   Escherichia coli DETECTED (A) NOT DETECTED   Klebsiella aerogenes NOT DETECTED NOT DETECTED   Klebsiella oxytoca NOT DETECTED NOT DETECTED   Klebsiella pneumoniae NOT DETECTED NOT DETECTED   Proteus species NOT DETECTED NOT DETECTED   Salmonella species NOT DETECTED NOT DETECTED   Serratia marcescens NOT DETECTED NOT DETECTED   Haemophilus influenzae NOT DETECTED  NOT DETECTED   Neisseria meningitidis NOT DETECTED NOT DETECTED   Pseudomonas aeruginosa NOT DETECTED NOT DETECTED   Stenotrophomonas maltophilia NOT DETECTED NOT DETECTED   Candida albicans NOT DETECTED NOT DETECTED   Candida auris NOT DETECTED NOT DETECTED   Candida glabrata NOT DETECTED NOT DETECTED   Candida krusei NOT DETECTED NOT DETECTED   Candida parapsilosis NOT DETECTED NOT DETECTED   Candida tropicalis NOT DETECTED NOT DETECTED   Cryptococcus neoformans/gattii NOT DETECTED NOT DETECTED   CTX-M ESBL NOT DETECTED NOT DETECTED   Carbapenem resistance IMP NOT DETECTED NOT DETECTED   Carbapenem resistance KPC NOT DETECTED NOT DETECTED   Carbapenem resistance NDM NOT DETECTED NOT DETECTED   Carbapenem resist OXA 48 LIKE NOT DETECTED NOT DETECTED   Carbapenem resistance VIM NOT DETECTED NOT DETECTED    Sloan Leiter, PharmD, BCPS, BCCCP Clinical Pharmacist Please refer to [facility] for [place] numbers 12/10/2020  12:51 PM

## 2020-12-11 ENCOUNTER — Inpatient Hospital Stay (HOSPITAL_COMMUNITY): Payer: Managed Care, Other (non HMO)

## 2020-12-11 DIAGNOSIS — R579 Shock, unspecified: Secondary | ICD-10-CM | POA: Diagnosis not present

## 2020-12-11 LAB — CBC WITH DIFFERENTIAL/PLATELET
Abs Immature Granulocytes: 0.2 10*3/uL — ABNORMAL HIGH (ref 0.00–0.07)
Basophils Absolute: 0.1 10*3/uL (ref 0.0–0.1)
Basophils Relative: 1 %
Eosinophils Absolute: 0 10*3/uL (ref 0.0–0.5)
Eosinophils Relative: 0 %
HCT: 34.7 % — ABNORMAL LOW (ref 36.0–46.0)
Hemoglobin: 11.7 g/dL — ABNORMAL LOW (ref 12.0–15.0)
Immature Granulocytes: 2 %
Lymphocytes Relative: 2 %
Lymphs Abs: 0.3 10*3/uL — ABNORMAL LOW (ref 0.7–4.0)
MCH: 30.2 pg (ref 26.0–34.0)
MCHC: 33.7 g/dL (ref 30.0–36.0)
MCV: 89.7 fL (ref 80.0–100.0)
Monocytes Absolute: 0.6 10*3/uL (ref 0.1–1.0)
Monocytes Relative: 5 %
Neutro Abs: 12 10*3/uL — ABNORMAL HIGH (ref 1.7–7.7)
Neutrophils Relative %: 90 %
Platelets: 26 10*3/uL — CL (ref 150–400)
RBC: 3.87 MIL/uL (ref 3.87–5.11)
RDW: 14.7 % (ref 11.5–15.5)
WBC: 13.2 10*3/uL — ABNORMAL HIGH (ref 4.0–10.5)
nRBC: 0 % (ref 0.0–0.2)

## 2020-12-11 LAB — CBC
HCT: 35.5 % — ABNORMAL LOW (ref 36.0–46.0)
Hemoglobin: 12 g/dL (ref 12.0–15.0)
MCH: 30.3 pg (ref 26.0–34.0)
MCHC: 33.8 g/dL (ref 30.0–36.0)
MCV: 89.6 fL (ref 80.0–100.0)
Platelets: 28 10*3/uL — CL (ref 150–400)
RBC: 3.96 MIL/uL (ref 3.87–5.11)
RDW: 14.6 % (ref 11.5–15.5)
WBC: 14.4 10*3/uL — ABNORMAL HIGH (ref 4.0–10.5)
nRBC: 0 % (ref 0.0–0.2)

## 2020-12-11 LAB — BASIC METABOLIC PANEL
Anion gap: 11 (ref 5–15)
BUN: 57 mg/dL — ABNORMAL HIGH (ref 6–20)
CO2: 17 mmol/L — ABNORMAL LOW (ref 22–32)
Calcium: 8.1 mg/dL — ABNORMAL LOW (ref 8.9–10.3)
Chloride: 104 mmol/L (ref 98–111)
Creatinine, Ser: 3.12 mg/dL — ABNORMAL HIGH (ref 0.44–1.00)
GFR, Estimated: 17 mL/min — ABNORMAL LOW (ref >60)
Glucose, Bld: 122 mg/dL — ABNORMAL HIGH (ref 70–99)
Potassium: 3.7 mmol/L (ref 3.5–5.1)
Sodium: 132 mmol/L — ABNORMAL LOW (ref 135–145)

## 2020-12-11 LAB — COMPREHENSIVE METABOLIC PANEL
ALT: 44 U/L (ref 0–44)
AST: 28 U/L (ref 15–41)
Albumin: 1.9 g/dL — ABNORMAL LOW (ref 3.5–5.0)
Alkaline Phosphatase: 97 U/L (ref 38–126)
Anion gap: 13 (ref 5–15)
BUN: 59 mg/dL — ABNORMAL HIGH (ref 6–20)
CO2: 16 mmol/L — ABNORMAL LOW (ref 22–32)
Calcium: 8.1 mg/dL — ABNORMAL LOW (ref 8.9–10.3)
Chloride: 106 mmol/L (ref 98–111)
Creatinine, Ser: 3.01 mg/dL — ABNORMAL HIGH (ref 0.44–1.00)
GFR, Estimated: 18 mL/min — ABNORMAL LOW (ref >60)
Glucose, Bld: 219 mg/dL — ABNORMAL HIGH (ref 70–99)
Potassium: 3.1 mmol/L — ABNORMAL LOW (ref 3.5–5.1)
Sodium: 135 mmol/L (ref 135–145)
Total Bilirubin: 0.8 mg/dL (ref 0.3–1.2)
Total Protein: 5 g/dL — ABNORMAL LOW (ref 6.5–8.1)

## 2020-12-11 LAB — MAGNESIUM
Magnesium: 1.9 mg/dL (ref 1.7–2.4)
Magnesium: 2.3 mg/dL (ref 1.7–2.4)

## 2020-12-11 LAB — GLUCOSE, CAPILLARY
Glucose-Capillary: 111 mg/dL — ABNORMAL HIGH (ref 70–99)
Glucose-Capillary: 91 mg/dL (ref 70–99)
Glucose-Capillary: 63 mg/dL — ABNORMAL LOW (ref 70–99)
Glucose-Capillary: 122 mg/dL — ABNORMAL HIGH (ref 70–99)
Glucose-Capillary: 178 mg/dL — ABNORMAL HIGH (ref 70–99)
Glucose-Capillary: 131 mg/dL — ABNORMAL HIGH (ref 70–99)
Glucose-Capillary: 155 mg/dL — ABNORMAL HIGH (ref 70–99)
Glucose-Capillary: 120 mg/dL — ABNORMAL HIGH (ref 70–99)

## 2020-12-11 LAB — PROCALCITONIN: Procalcitonin: 93.14 ng/mL

## 2020-12-11 LAB — PHOSPHORUS: Phosphorus: 3.9 mg/dL (ref 2.5–4.6)

## 2020-12-11 MED ORDER — DEXTROSE 50 % IV SOLN: 12.5000 g | INTRAVENOUS | Status: AC

## 2020-12-11 MED ORDER — MAGNESIUM SULFATE 2 GM/50ML IV SOLN
2.0000 g | Freq: Once | INTRAVENOUS | Status: AC
  Administered 2020-12-11: 2 g via INTRAVENOUS
  Filled 2020-12-11: qty 50

## 2020-12-11 MED ORDER — NALOXONE HCL 0.4 MG/ML IJ SOLN
0.2000 mg | Freq: Once | INTRAMUSCULAR | Status: AC
  Administered 2020-12-11: 0.2 mg via INTRAVENOUS
  Filled 2020-12-11: qty 1

## 2020-12-11 MED ORDER — DEXTROSE IN LACTATED RINGERS 5 % IV SOLN: INTRAVENOUS | Status: DC

## 2020-12-11 MED ORDER — ACETAMINOPHEN 325 MG PO TABS
650.0000 mg | ORAL_TABLET | Freq: Four times a day (QID) | ORAL | Status: DC | PRN
  Administered 2020-12-12: 650 mg via ORAL
  Filled 2020-12-11 (×2): qty 2

## 2020-12-11 MED ORDER — LACTATED RINGERS IV SOLN: INTRAVENOUS | Status: DC

## 2020-12-11 MED ORDER — DEXTROSE 50 % IV SOLN
INTRAVENOUS | Status: AC
  Administered 2020-12-11: 12.5 g via INTRAVENOUS
  Filled 2020-12-11: qty 50

## 2020-12-11 NOTE — Progress Notes (Signed)
NAME:  Melanie Madden, MRN:  [id number], DOB:  1966/02/11, LOS: 2 ADMISSION DATE:  12/09/2020, CONSULTATION DATE:  12/09/20 REFERRING MD: Thamas Jaegers  CHIEF COMPLAINT:  Syncopal episode  History of Present Illness:  Melanie Madden is a 55 y.o. 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  She is brought into the ED via EMS from home with syncopal episode.  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 She was taken to IR for percutaneous nephrostomy tube. She was started on vancomycin, cefepime and flagyl. Blood cultures are positive 2/4 bottles with enterobacterales and e. Coli.  Pertinent  Medical History  Renal Stones CVA - global aphasia Hypertension  Significant Hospital Events: Including procedures, antibiotic start and stop dates in addition to other pertinent events   . 5/13 IR placed perc nephrostomy tube and admitted to ICU  Interim History / Subjective:   No acute events overnight. She is somnolent this morning. Levophed weaned off overnight.   Objective   Blood pressure 104/67, pulse (!) 104, temperature 97.7 F (36.5 C), temperature source Axillary, resp. rate 14, height 5\' 7"  (1.702 m), weight 72.3 kg, SpO2 94 %.        Intake/Output Summary (Last 24 hours) at 12/11/2020 0923 Last data filed at 12/11/2020 0900 Gross per 24 hour  Intake 2898.83 ml  Output 1060 ml  Net 1838.83 ml   Filed Weights   12/09/20 1742 12/10/20 0100 12/11/20 0020  Weight: 63.3 kg 70.9 kg 72.3 kg    Examination: General: no acute distress, awakes to sternal rub HENT: [place]/AT, sclera anicteric, moist mucous membranes Lungs: clear to auscultation. No wheezing or rhonchi. Cardiovascular: rrr, s1s2. No murmurs Abdomen: soft, non-tender, BS+ Extremities: warm, no edema Neuro: somnolent, spontaneously moves extremities GU:  foley  Labs/imaging that I have personally reviewed  (right click and "Reselect all SmartList Selections" daily)  CT abdomen 5/13 CXR 5/13 US abdomen 5/14 Blood cultures  BMP - Cr 3.12, mg 1.9 Procal - 108 > 93  Plts - 204 > 65 > 28 (Platlet clumps noted on 5/13)  Resolved Hospital Problem list     Assessment & Plan:   Septic Shock Emphysematous Pyelitis - renal calculus Bacteremia - E. Coli  - Continue ceftriaxone - Will repeat blood cultures tomorrow - Levophed weaned off overnight. - LR 18mL/hr for 12 hours today  Acute Kidney Injury - Baseline Cr 0.63-0.8 - Cr improving, continue to monitor - Fluid resuscitation as above  Altered Mental Status - Change in mental status from yesterday, will obtain stat CT head. Possibly sedated from narcotic pain medicine.  - Hold further pain medicine  Thrombocytopenia Sepsis related vs platelet clumping - Will continue to monitor, check CBC with diff this AM to check for further clumping - Stop heparin subc  Electrolytes - K, Mg - replete as needed  Best practice (right click and "Reselect all SmartList Selections" daily)  Diet:  Oral Pain/Anxiety/Delirium protocol (if indicated): No VAP protocol (if indicated): Not indicated DVT prophylaxis: Subcutaneous Heparin and Contraindicated GI prophylaxis: N/A Glucose control:  SSI Yes Central venous access:  Yes, and it is still needed Arterial line:  N/A Foley:  Yes, and it is still needed Mobility:  bed rest  PT consulted: N/A Last date of multidisciplinary goals of care discussion [n/a] Code Status:  full code Disposition: Will consider transfer to progressive unit once mental status improves  Labs  CBC: Recent Labs  Lab 12/09/20 1748 12/10/20 0559 12/11/20 0500  WBC 15.0* 22.4* 14.4*  NEUTROABS 13.8*  --   --   HGB 12.7 12.7 12.0  HCT 38.7 39.1 35.5*  MCV 92.4 91.8 89.6  PLT PLATELET CLUMPS NOTED ON SMEAR, UNABLE TO ESTIMATE 65* 28*    Basic Metabolic  Panel: Recent Labs  Lab 12/09/20 1748 12/10/20 0559 12/11/20 0500  NA 136 133* 132*  K 3.0* 4.4 3.7  CL 104 103 104  CO2 20* 17* 17*  GLUCOSE 153* 177* 122*  BUN 43* 50* 57*  CREATININE 3.36* 3.19* 3.12*  CALCIUM 9.6 8.2* 8.1*  MG  --  1.6* 1.9  PHOS  --  4.2  --    GFR: Estimated Creatinine Clearance: 19.8 mL/min (A) (by C-G formula based on SCr of 3.12 mg/dL (H)). Recent Labs  Lab 12/09/20 1748 12/09/20 1749 12/09/20 1949 12/09/20 2336 12/10/20 0559 12/11/20 0500  PROCALCITON  --   --   --  99.04 108.90 93.14  WBC 15.0*  --   --   --  22.4* 14.4*  LATICACIDVEN  --  5.6* 2.8*  --   --   --     Liver Function Tests: Recent Labs  Lab 12/09/20 1748  AST 38  ALT 35  ALKPHOS 128*  BILITOT 1.1  PROT 5.4*  ALBUMIN 2.7*   No results for input(s): LIPASE, AMYLASE in the last 168 hours. No results for input(s): AMMONIA in the last 168 hours.  ABG    Component Value Date/Time   TCO2 24 07/09/2019 1956     Coagulation Profile: Recent Labs  Lab 12/10/20 0559  INR 1.8*    Cardiac Enzymes: No results for input(s): CKTOTAL, CKMB, CKMBINDEX, TROPONINI in the last 168 hours.  HbA1C: Hgb A1c MFr Bld  Date/Time Value Ref Range Status  12/09/2020 01:11 AM 5.2 4.8 - 5.6 % Final    Comment:    (NOTE) Pre diabetes:          5.7%-6.4%  Diabetes:              >6.4%  Glycemic control for   <7.0% adults with diabetes   07/10/2019 11:45 AM 5.3 4.8 - 5.6 % Final    Comment:    (NOTE) Pre diabetes:          5.7%-6.4% Diabetes:              >6.4% Glycemic control for   <7.0% adults with diabetes     CBG: Recent Labs  Lab 12/10/20 1523 12/10/20 1919 12/10/20 2351 12/11/20 0349 12/11/20 0715  GLUCAP 139* 147* 118* 111* 91       Critical care time: 35 minutes    Freda Jackson, MD [facility] Office: [phone]   See Amion for personal pager PCCM on call pager [phone] until 7pm. Please call Elink 7p-7a.  [phone]

## 2020-12-12 ENCOUNTER — Other Ambulatory Visit: Payer: Self-pay | Admitting: Urology

## 2020-12-12 ENCOUNTER — Ambulatory Visit: Payer: Managed Care, Other (non HMO)

## 2020-12-12 DIAGNOSIS — R579 Shock, unspecified: Secondary | ICD-10-CM | POA: Diagnosis not present

## 2020-12-12 LAB — CULTURE, BLOOD (ROUTINE X 2)

## 2020-12-12 LAB — BASIC METABOLIC PANEL
Anion gap: 10 (ref 5–15)
BUN: 52 mg/dL — ABNORMAL HIGH (ref 6–20)
CO2: 17 mmol/L — ABNORMAL LOW (ref 22–32)
Calcium: 8.5 mg/dL — ABNORMAL LOW (ref 8.9–10.3)
Chloride: 108 mmol/L (ref 98–111)
Creatinine, Ser: 2.54 mg/dL — ABNORMAL HIGH (ref 0.44–1.00)
GFR, Estimated: 22 mL/min — ABNORMAL LOW (ref >60)
Glucose, Bld: 112 mg/dL — ABNORMAL HIGH (ref 70–99)
Potassium: 2.9 mmol/L — ABNORMAL LOW (ref 3.5–5.1)
Sodium: 135 mmol/L (ref 135–145)

## 2020-12-12 LAB — GLUCOSE, CAPILLARY
Glucose-Capillary: 147 mg/dL — ABNORMAL HIGH (ref 70–99)
Glucose-Capillary: 85 mg/dL (ref 70–99)
Glucose-Capillary: 67 mg/dL — ABNORMAL LOW (ref 70–99)
Glucose-Capillary: 70 mg/dL (ref 70–99)
Glucose-Capillary: 172 mg/dL — ABNORMAL HIGH (ref 70–99)
Glucose-Capillary: 186 mg/dL — ABNORMAL HIGH (ref 70–99)
Glucose-Capillary: 168 mg/dL — ABNORMAL HIGH (ref 70–99)

## 2020-12-12 LAB — CBC
HCT: 35 % — ABNORMAL LOW (ref 36.0–46.0)
Hemoglobin: 11.7 g/dL — ABNORMAL LOW (ref 12.0–15.0)
MCH: 29.8 pg (ref 26.0–34.0)
MCHC: 33.4 g/dL (ref 30.0–36.0)
MCV: 89.3 fL (ref 80.0–100.0)
Platelets: 25 10*3/uL — CL (ref 150–400)
RBC: 3.92 MIL/uL (ref 3.87–5.11)
RDW: 14.6 % (ref 11.5–15.5)
WBC: 13.6 10*3/uL — ABNORMAL HIGH (ref 4.0–10.5)
nRBC: 0 % (ref 0.0–0.2)

## 2020-12-12 LAB — POTASSIUM: Potassium: 4.2 mmol/L (ref 3.5–5.1)

## 2020-12-12 LAB — MAGNESIUM: Magnesium: 2.4 mg/dL (ref 1.7–2.4)

## 2020-12-12 MED ORDER — POTASSIUM CHLORIDE 10 MEQ/50ML IV SOLN
10.0000 meq | INTRAVENOUS | Status: AC
  Administered 2020-12-12 (×4): 10 meq via INTRAVENOUS
  Filled 2020-12-12 (×4): qty 50

## 2020-12-12 MED ORDER — DEXTROSE 10 % IV SOLN: INTRAVENOUS | Status: DC

## 2020-12-12 MED ORDER — SODIUM CHLORIDE 0.9% FLUSH: 5.0000 mL | Freq: Three times a day (TID) | INTRAVENOUS | Status: DC

## 2020-12-12 MED ORDER — ATORVASTATIN CALCIUM 10 MG PO TABS
20.0000 mg | ORAL_TABLET | Freq: Every day | ORAL | Status: DC
  Administered 2020-12-12 – 2020-12-16 (×5): 20 mg via ORAL
  Filled 2020-12-12 (×5): qty 2

## 2020-12-12 MED ORDER — POTASSIUM CHLORIDE CRYS ER 20 MEQ PO TBCR: 40.0000 meq | EXTENDED_RELEASE_TABLET | Freq: Once | ORAL | Status: DC

## 2020-12-12 MED ORDER — POTASSIUM CHLORIDE 20 MEQ PO PACK
60.0000 meq | PACK | Freq: Once | ORAL | Status: AC
  Administered 2020-12-12: 60 meq
  Filled 2020-12-12: qty 3

## 2020-12-12 MED ORDER — SERTRALINE HCL 50 MG PO TABS
50.0000 mg | ORAL_TABLET | Freq: Every day | ORAL | Status: DC
  Administered 2020-12-12 – 2020-12-16 (×5): 50 mg via ORAL
  Filled 2020-12-12 (×5): qty 1

## 2020-12-12 NOTE — Progress Notes (Signed)
Per elink, potassium lab value to be redrawn when Kriders have finished infusing not at this current time ordered.

## 2020-12-12 NOTE — Progress Notes (Addendum)
Patient's CBG 67. Patient alert and able to take PO's. 240 mL of juice given. Will re-check CBG in 15 minutes.   6967 - Patient's CBG 70. Patient's breakfast tray also arrived and is currently eating her breakfast tray. Patient also has D5LR running.

## 2020-12-12 NOTE — Progress Notes (Signed)
NAME:  Melanie Madden, MRN:  [id number], DOB:  11/10/1965, LOS: 3 ADMISSION DATE:  12/09/2020, CONSULTATION DATE:  12/09/20 REFERRING MD: Thamas Jaegers  CHIEF COMPLAINT:  Syncopal episode  History of Present Illness:  Melanie Madden is a 55 y.o. 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  She is brought into the ED via EMS from home with syncopal episode.  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 She was taken to IR for percutaneous nephrostomy tube. She was started on vancomycin, cefepime and flagyl. Blood cultures are positive 2/4 bottles with enterobacterales and e. Coli.  Pertinent  Medical History  Renal Stones CVA - global aphasia Hypertension  Significant Hospital Events: Including procedures, antibiotic start and stop dates in addition to other pertinent events   . 5/13 IR placed perc nephrostomy tube and admitted to ICU  Cefepime 5/13 Flagyl 5/13 Vanc 5/13 Zosyn 5/14  Ceftriaxone 5/14 >>  Interim History / Subjective:   No acute events overnight. She is more alert this morning. Blood sugar was in the 60s this morning but patient asymptomatic  Objective   Blood pressure 137/85, pulse 95, temperature 98.2 F (36.8 C), resp. rate (!) 22, height 5\' 7"  (1.702 m), weight 71.8 kg, SpO2 96 %.        Intake/Output Summary (Last 24 hours) at 12/12/2020 0801 Last data filed at 12/12/2020 0700 Gross per 24 hour  Intake 3026.93 ml  Output 2030 ml  Net 996.93 ml   Filed Weights   12/10/20 0100 12/11/20 0020 12/12/20 0500  Weight: 70.9 kg 72.3 kg 71.8 kg    Examination: General: no acute distress, awake HENT: /AT, sclera anicteric, moist mucous membranes Lungs: clear to auscultation. No wheezing or rhonchi. Cardiovascular: tachycardic, s1s2. No murmurs Abdomen: soft, non-tender, BS+. Right nephrostomy tube in place,  serosanguinous drainage Extremities: warm, no edema Neuro: awake, alert, following commands, residual weakness on right upper and lower extremities GU: foley  Labs/imaging that I have personally reviewed  (right click and "Reselect all SmartList Selections" daily)  CT abdomen 5/13 CXR 5/13 US abdomen 5/14 Blood cultures 5/13 - e. coli  BMP - Cr 2.54, K 2.9 Procal - 108 > 93  Plts - 204 > 65 > 28 > 25  Resolved Hospital Problem list     Assessment & Plan:   Septic Shock Emphysematous Pyelitis - renal calculus Bacteremia - E. Coli  S/p nephrostomy tube - Continue ceftriaxone - Follow up repeat blood cultures from 5/15 - Flush nephrostomy tube q8hrs per IR  Acute Kidney Injury - Baseline Cr 0.63-0.8 - Cr improving, continue to monitor  Metabolic Encephalopathy - Her mental status is improved today - Likely combination of narcotic pain meds and shock  Thrombocytopenia Sepsis related - Confirmed with lab that no further platelet clumping noted - Will continue to monitor - hold heparin subc  Hypoglycemia - D10 at 26mL/hr  Electrolytes - K, Mg - replete as needed  Best practice (right click and "Reselect all SmartList Selections" daily)  Diet:  Oral Pain/Anxiety/Delirium protocol (if indicated): No VAP protocol (if indicated): Not indicated DVT prophylaxis: Subcutaneous Heparin and Contraindicated GI prophylaxis: N/A Glucose control:  SSI Yes Central venous access:  Yes, and it is still needed Arterial line:  N/A Foley:  Yes, and it is still needed Mobility:  bed rest  PT consulted: N/A Last date of multidisciplinary goals of care discussion [n/a] Code  Status:  full code Disposition: Will consider transfer to progressive unit   Labs   CBC: Recent Labs  Lab 12/09/20 1748 12/10/20 0559 12/11/20 0500 12/11/20 0934 12/12/20 0358  WBC 15.0* 22.4* 14.4* 13.2* 13.6*  NEUTROABS 13.8*  --   --  12.0*  --   HGB 12.7 12.7 12.0 11.7* 11.7*  HCT 38.7 39.1  35.5* 34.7* 35.0*  MCV 92.4 91.8 89.6 89.7 89.3  PLT PLATELET CLUMPS NOTED ON SMEAR, UNABLE TO ESTIMATE 65* 28* 26* 25*    Basic Metabolic Panel: Recent Labs  Lab 12/09/20 1748 12/10/20 0559 12/11/20 0500 12/11/20 1548 12/12/20 0358  NA 136 133* 132* 135 135  K 3.0* 4.4 3.7 3.1* 2.9*  CL 104 103 104 106 108  CO2 20* 17* 17* 16* 17*  GLUCOSE 153* 177* 122* 219* 112*  BUN 43* 50* 57* 59* 52*  CREATININE 3.36* 3.19* 3.12* 3.01* 2.54*  CALCIUM 9.6 8.2* 8.1* 8.1* 8.5*  MG  --  1.6* 1.9 2.3  --   PHOS  --  4.2  --  3.9  --    GFR: Estimated Creatinine Clearance: 24.3 mL/min (A) (by C-G formula based on SCr of 2.54 mg/dL (H)). Recent Labs  Lab 12/09/20 1749 12/09/20 1949 12/09/20 2336 12/10/20 0559 12/11/20 0500 12/11/20 0934 12/12/20 0358  PROCALCITON  --   --  99.04 108.90 93.14  --   --   WBC  --   --   --  22.4* 14.4* 13.2* 13.6*  LATICACIDVEN 5.6* 2.8*  --   --   --   --   --     Liver Function Tests: Recent Labs  Lab 12/09/20 1748 12/11/20 1548  AST 38 28  ALT 35 44  ALKPHOS 128* 97  BILITOT 1.1 0.8  PROT 5.4* 5.0*  ALBUMIN 2.7* 1.9*   No results for input(s): LIPASE, AMYLASE in the last 168 hours. No results for input(s): AMMONIA in the last 168 hours.  ABG    Component Value Date/Time   TCO2 24 07/09/2019 1956     Coagulation Profile: Recent Labs  Lab 12/10/20 0559  INR 1.8*    Cardiac Enzymes: No results for input(s): CKTOTAL, CKMB, CKMBINDEX, TROPONINI in the last 168 hours.  HbA1C: Hgb A1c MFr Bld  Date/Time Value Ref Range Status  12/09/2020 01:11 AM 5.2 4.8 - 5.6 % Final    Comment:    (NOTE) Pre diabetes:          5.7%-6.4%  Diabetes:              >6.4%  Glycemic control for   <7.0% adults with diabetes   07/10/2019 11:45 AM 5.3 4.8 - 5.6 % Final    Comment:    (NOTE) Pre diabetes:          5.7%-6.4% Diabetes:              >6.4% Glycemic control for   <7.0% adults with diabetes     CBG: Recent Labs  Lab  12/11/20 2123 12/11/20 2308 12/12/20 0321 12/12/20 0727 12/12/20 0752  GLUCAP 155* 120* 85 67* 70       Critical care time: n/a    Freda Jackson, MD [facility] Office: [phone]   See Amion for personal pager PCCM on call pager [phone] until 7pm. Please call Elink 7p-7a. [phone]

## 2020-12-12 NOTE — Progress Notes (Addendum)
[facility] Progress Note Patient Name: Melanie Madden DOB: [birth year] MRN: [id number]   Date of Service  12/12/2020  HPI/Events of Note  K 2.9, Cr > 2.5 Has NG tube  eICU Interventions  Kcl IV total 40 meq in 4 hrs and  Follow K level and Mg.      Intervention Category Intermediate Interventions: Electrolyte abnormality - evaluation and management  Elmer Sow 12/12/2020, 5:04 AM

## 2020-12-13 ENCOUNTER — Ambulatory Visit: Payer: Managed Care, Other (non HMO)

## 2020-12-13 DIAGNOSIS — R6521 Severe sepsis with septic shock: Secondary | ICD-10-CM | POA: Diagnosis not present

## 2020-12-13 DIAGNOSIS — A419 Sepsis, unspecified organism: Secondary | ICD-10-CM | POA: Diagnosis not present

## 2020-12-13 LAB — BASIC METABOLIC PANEL
Anion gap: 6 (ref 5–15)
BUN: 46 mg/dL — ABNORMAL HIGH (ref 6–20)
CO2: 18 mmol/L — ABNORMAL LOW (ref 22–32)
Calcium: 8.7 mg/dL — ABNORMAL LOW (ref 8.9–10.3)
Chloride: 113 mmol/L — ABNORMAL HIGH (ref 98–111)
Creatinine, Ser: 2.24 mg/dL — ABNORMAL HIGH (ref 0.44–1.00)
GFR, Estimated: 25 mL/min — ABNORMAL LOW (ref >60)
Glucose, Bld: 122 mg/dL — ABNORMAL HIGH (ref 70–99)
Potassium: 3.2 mmol/L — ABNORMAL LOW (ref 3.5–5.1)
Sodium: 137 mmol/L (ref 135–145)

## 2020-12-13 LAB — CBC
HCT: 33.9 % — ABNORMAL LOW (ref 36.0–46.0)
Hemoglobin: 11.8 g/dL — ABNORMAL LOW (ref 12.0–15.0)
MCH: 29.8 pg (ref 26.0–34.0)
MCHC: 34.8 g/dL (ref 30.0–36.0)
MCV: 85.6 fL (ref 80.0–100.0)
Platelets: 23 10*3/uL — CL (ref 150–400)
RBC: 3.96 MIL/uL (ref 3.87–5.11)
RDW: 14.6 % (ref 11.5–15.5)
WBC: 15.1 10*3/uL — ABNORMAL HIGH (ref 4.0–10.5)
nRBC: 0 % (ref 0.0–0.2)

## 2020-12-13 LAB — GLUCOSE, CAPILLARY
Glucose-Capillary: 102 mg/dL — ABNORMAL HIGH (ref 70–99)
Glucose-Capillary: 122 mg/dL — ABNORMAL HIGH (ref 70–99)
Glucose-Capillary: 131 mg/dL — ABNORMAL HIGH (ref 70–99)
Glucose-Capillary: 134 mg/dL — ABNORMAL HIGH (ref 70–99)
Glucose-Capillary: 157 mg/dL — ABNORMAL HIGH (ref 70–99)
Glucose-Capillary: 99 mg/dL (ref 70–99)

## 2020-12-13 MED ORDER — POTASSIUM CHLORIDE CRYS ER 20 MEQ PO TBCR
40.0000 meq | EXTENDED_RELEASE_TABLET | Freq: Two times a day (BID) | ORAL | Status: AC
  Administered 2020-12-13 – 2020-12-14 (×4): 40 meq via ORAL
  Filled 2020-12-13 (×4): qty 2

## 2020-12-13 MED ORDER — HYPROMELLOSE (GONIOSCOPIC) 2.5 % OP SOLN
1.0000 [drp] | Freq: Three times a day (TID) | OPHTHALMIC | Status: DC | PRN
  Administered 2020-12-14 – 2020-12-15 (×3): 1 [drp] via OPHTHALMIC
  Filled 2020-12-13: qty 15

## 2020-12-13 NOTE — Progress Notes (Signed)
Referring Physician(s): Dr. Liliane ShiWinter, C.   Supervising Physician: Simonne ComeWatts, John  [facility]  [facility]  Chief Complaint:  S/p right PCN catheter placement on 5/13 with Dr. Grace IsaacWatts   Subjective:  Pt laying in bed comfortably.  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 ROS was not performed.   Allergies: Amoxicillin and Shellfish allergy  Medications: Prior to Admission medications   Medication Sig Start Date End Date Taking? Authorizing Provider  amLODipine (NORVASC) 10 MG tablet Take 10 mg by mouth daily.   Yes [provider]  atorvastatin (LIPITOR) 20 MG tablet Take 1 tablet (20 mg total) by mouth daily at 6 PM. 08/20/19  Yes Angiulli, Mcarthur Rossettianiel J, PA-C  B Complex-C (B-COMPLEX WITH VITAMIN C) tablet Take 1 tablet by mouth daily.   Yes [provider]  CALCIUM PO Take 1 tablet by mouth daily.   Yes [provider]  cholecalciferol (VITAMIN D) 1000 units tablet Take 1,000 Units by mouth daily. 2,000 units daily   Yes [provider]  docusate sodium (COLACE) 100 MG capsule Take 300 mg by mouth daily.   Yes [provider]  magnesium 30 MG tablet Take 30 mg by mouth daily.   Yes [provider]  Omega-3 Fatty Acids (FISH OIL) 1200 MG CPDR Take 1 capsule by mouth daily.   Yes [provider]  QUEtiapine (SEROQUEL) 50 MG tablet Take 1 tablet (50 mg total) by mouth 2 (two) times daily. 11/10/20  Yes Kirsteins, Victorino SparrowAndrew E, MD  sertraline (ZOLOFT) 50 MG tablet Take 1 tablet (50 mg total) by mouth daily. 11/10/20  Yes Kirsteins, Victorino SparrowAndrew E, MD  VITAMIN E PO Take 1 tablet by mouth daily.    Yes [provider]  zinc sulfate 220 (50 Zn) MG capsule Take 220 mg by mouth daily.   Yes [provider]  gabapentin (NEURONTIN) 100 MG capsule Take 1 capsule (100 mg total) by mouth 3 (three) times daily. Patient not taking: Reported on 12/09/2020 05/06/20   Erick ColaceKirsteins, Andrew E, MD  LORazepam (ATIVAN) 1  MG tablet Take 1 tablet (1 mg total) by mouth every 8 (eight) hours as needed for anxiety. Patient not taking: Reported on 12/09/2020 08/29/19   Bethann BerkshireZammit, Joseph, MD     Vital Signs: BP (!) 148/91 (BP Location: Left Arm)   Pulse 96   Temp 98.6 F (37 C) (Oral)   Resp 16   Ht 5\' 7"  (1.702 m)   Wt 164 lb 0.4 oz (74.4 kg)   SpO2 97%   BMI 25.69 kg/m   Physical Exam Vitals reviewed.  Constitutional:      General: She is not in acute distress. HENT:     Head: Normocephalic and atraumatic.  Cardiovascular:     Rate and Rhythm: Normal rate.  Pulmonary:     Effort: Pulmonary effort is normal.  Abdominal:     General: Abdomen is flat.     Palpations: Abdomen is soft.     Comments: Positive right PCN drain to a gravity bag. Site is unremarkable with no erythema, edema, tenderness, bleeding or drainage. Suture and stat lock in place. Dressing is clean, dry, and intact. Trace of blood tinged fluid noted in the gravity bag. Drain aspirates and flushes well.   Skin:    General: Skin is warm and dry.  Neurological:     Mental Status: She is disoriented.     Imaging: CT HEAD WO CONTRAST  Result Date: 12/11/2020 CLINICAL DATA:  Mental status changes.  History of CVA. EXAM: CT HEAD WITHOUT CONTRAST TECHNIQUE: Contiguous axial images were obtained from the base of the skull through the vertex without intravenous contrast. COMPARISON:  Multiple prior studies including 12/29/2019 and July 24, 2019 FINDINGS: Brain: There is persistent encephalomalacia of the LEFT frontal lobe, temporal lobe, LEFT corona radiata, and LEFT thalamus consistent with remote intracranial hemorrhage. There is mild central and cortical atrophy. Periventricular white matter changes are consistent with small vessel disease. A 7 millimeter extra-axial calcified mass in the LEFT frontal lobe likely represents a small meningioma and is not associated with surrounding edema. Vascular: No hyperdense vessel or unexpected  calcification. Skull: Normal. Negative for fracture or focal lesion. Sinuses/Orbits: No acute finding. Other: None IMPRESSION: 1. Remote changes consistent with previous intracranial hemorrhage. 2.  No evidence for acute  abnormality. 3. Atrophy and small vessel disease. 4. 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 Electronically Signed   By: Nolon Nations M.D.   On: 12/11/2020 10:43   DG Chest Port 1 View  Result Date: 12/09/2020 CLINICAL DATA:  Central line placement. EXAM: PORTABLE CHEST 1 VIEW COMPARISON:  Chest x-ray from same day at 1853 hours. FINDINGS: New right internal jugular central venous catheter with tip near the cavoatrial junction. The heart size and mediastinal contours are within normal limits. Mild bibasilar atelectasis. No focal consolidation, pleural effusion, or pneumothorax. No acute osseous abnormality. IMPRESSION: 1. New right internal jugular central venous catheter without complicating feature. Electronically Signed   By: Titus Dubin M.D.   On: 12/09/2020 20:34   DG Chest Port 1 View  Result Date: 12/09/2020 CLINICAL DATA:  Syncope EXAM: PORTABLE CHEST 1 VIEW COMPARISON:  12/19/2019 FINDINGS: The heart size and mediastinal contours are within normal limits. Both lungs are clear. Inferior subluxation of right humeral head as before IMPRESSION: No active disease. Electronically Signed   By: Donavan Foil M.D.   On: 12/09/2020 19:17   CT Renal Stone Study  Result Date: 12/09/2020 CLINICAL DATA:  Sepsis.  History of obstructive renal calculi EXAM: CT ABDOMEN AND PELVIS WITHOUT CONTRAST TECHNIQUE: Multidetector CT imaging of the abdomen and pelvis was performed following the standard protocol without oral or IV contrast. COMPARISON:  None. FINDINGS: Lower chest: There is ill-defined patchy opacity in the lung bases primarily due to atelectasis. Hepatobiliary: No focal liver lesions are appreciable on this noncontrast enhanced  study. The gallbladder is mildly distended with apparent sludge within the gallbladder. No appreciable biliary duct dilatation. Pancreas: There is no evident pancreatic mass or inflammatory focus. Spleen: Probable small granuloma in the spleen. Spleen otherwise appears unremarkable. Adrenals/Urinary Tract: Right adrenal appears normal. 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 Question left adrenal cyst versus adenoma. The attenuation of this mass is in the cystic range. The right kidney is edematous. There are small areas of air within the right kidney, consistent with emphysematous pyelitis. There is mild hydronephrosis on the right. There is no appreciable hydronephrosis on the left. No air seen within the left kidney/collecting system. No renal masses. There are scattered 1-2 mm calculi throughout the left kidney. There is a 4 x 2 mm calculus in the lower pole left kidney. There are scattered 1-2 mm calculi throughout the right kidney. There are two adjacent 3 mm calculi in the mid to lower right kidney with nearby 1-2 mm calculi. There is a calculus in the mid to lower right kidney occupying a calyx measuring 1.8  x 0.8 cm. There is a calculus with immediately adjacent air at the right ureteropelvic junction measuring 8 x 6 mm. No other ureteral calculi are evident. Urinary bladder is midline with wall thickness overall increased. Stomach/Bowel: Stomach filled with fluid. Gastric wall not thickened. No small or large bowel wall thickening evident. No appreciable bowel obstruction. Terminal ileum appears unremarkable. There are several calcifications within the appendix. Appendix is normal in size and contour without appendiceal wall thickening. There is no pneumoperitoneum or portal venous air. Vascular/Lymphatic: Foci of aortic atherosclerosis. No aneurysm of the aorta evident. No adenopathy in the abdomen or pelvis. Reproductive: Uterus anteverted. No uterine or adnexal masses are evident. Other:  There is a small amount of ascites in the dependent portion of the pelvis. No abscess in the abdomen or pelvis. Musculoskeletal: Degenerative change in the lower thoracic and lumbar regions. There is lumbar levoscoliosis. No blastic or lytic bone lesions. No intramuscular or abdominal wall lesions. IMPRESSION: 1. Foci of air within right renal collecting system as well as at the right ureteropelvic junction consistent with a degree of emphysematous pyelitis. Mild hydronephrosis on the right. There is an 8 x 6 calculus with immediately adjacent air at the right ureteropelvic junction. No other ureteral calculi. Note that right kidney appears edematous. 2. Multiple calculi in each kidney, nonobstructing as noted above. Largest of these calculi measures 1.8 x 0.8 cm in a lower pole calyx on the right. 3. No appreciable bowel wall or mesenteric thickening. No bowel obstruction. No abscess in the abdomen or pelvis. Appendix normal in size and contour without inflammation. There are calcifications within the appendix. 4.  Small amount of ascites in dependent portion pelvis. 5. Gallbladder is rather distended and contains sludge. This finding may warrant gallbladder ultrasound to further evaluate. 6.  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 7. Probable atelectasis in the lung bases. Mild superimposed pneumonia in the bases is difficult to exclude in this circumstance. 8.  Aortic Atherosclerosis (ICD10-I70.0). Electronically Signed   By: Lowella Grip III M.D.   On: 12/09/2020 21:04   IR NEPHROSTOMY PLACEMENT RIGHT  Result Date: 12/10/2020 INDICATION: History of nephrolithiasis, now admitted with urosepsis. Please perform image guided placement of right-sided nephrostomy catheter for infection source control purposes. EXAM: ULTRASOUND AND FLUOROSCOPIC GUIDED PLACEMENT OF RIGHT NEPHROSTOMY TUBE COMPARISON:  CT abdomen pelvis-12/09/2020 MEDICATIONS: Ciprofloxacin 400 mg IV; The antibiotic was  administered in an appropriate time frame prior to skin puncture. ANESTHESIA/SEDATION: None CONTRAST:  10 mL Isovue 300 - administered into the renal collecting system FLUOROSCOPY TIME:  3 minutes, 6 seconds (25 mGy) COMPLICATIONS: None immediate. PROCEDURE: The procedure, risks, benefits, and alternatives were explained to the patient's husband, questions were encouraged and answered and informed consent was obtained. A timeout was performed prior to the initiation of the procedure. The operative site was prepped and draped in the usual sterile fashion and a sterile drape was applied covering the operative field. A sterile gown and sterile gloves were used for the procedure. Local anesthesia was provided with 1% Lidocaine with epinephrine. Ultrasound was used to localize the left kidney. As there is no significant dilatation of the inferior pole renal calices, under direct ultrasound guidance, a 20 gauge needle was advanced into the renal collecting system a very mildly dilated posterior midpole calyx. An ultrasound image documentation was performed. Access within the collecting system was confirmed with the efflux of urine followed by limited contrast injection. Under intermittent fluoroscopic guidance, an 0.018 wire was  advanced into the collecting system and the tract was dilated with an Accustick stent. Next, over a short Amplatz wire, the track was further dilated ultimately allowing placement of a 10-French percutaneous nephrostomy catheter with end coiled and locked within the renal pelvis. Contrast was injected and several spot fluoroscopic images were obtained in various obliquities. The catheter was secured at the skin entrance site with an interrupted suture and a stat lock device and connected to a gravity bag. Dressings were applied. The patient tolerated procedure well without immediate postprocedural complication. FINDINGS: Ultrasound scanning demonstrates very mild left-sided pelvicaliectasis as was  demonstrated on preceding noncontrast abdominal CT. As there is no significant dilatation of the inferior pole renal calices, a very mildly dilated posterior midpole calyx was targeted under direct ultrasound guidance allowing access to the left renal collecting system. Next, under fluoroscopic guidance, a 10-French percutaneous nephrostomy catheter was placed with end coiled and locked within the renal pelvis. Contrast injection confirmed appropriate positioning. IMPRESSION: Successful ultrasound and fluoroscopic guided placement of a left sided 10 French PCN. Electronically Signed   By: Sandi Mariscal M.D.   On: 12/10/2020 08:10   US Abdomen Limited RUQ (LIVER/GB)  Result Date: 12/10/2020 CLINICAL DATA:  Sludge in the gallbladder EXAM: ULTRASOUND ABDOMEN LIMITED RIGHT UPPER QUADRANT COMPARISON:  CT abdomen pelvis 12/09/2020 FINDINGS: Gallbladder: No gallstones or wall thickening visualized. Small amount of sludge in the gallbladder. No sonographic Murphy sign noted by sonographer. Common bile duct: Diameter: 0.5 cm, within normal limits Liver: No focal lesion identified. Within normal limits in parenchymal echogenicity. Portal vein is patent on color Doppler imaging with normal direction of blood flow towards the liver. Other: None. IMPRESSION: 1. Mild amount of sludge in the gallbladder. Otherwise unremarkable. 2.  Normal sonographic appearance of the liver. Electronically Signed   By: Audie Pinto M.D.   On: 12/10/2020 10:54    Labs:  CBC: Recent Labs    12/11/20 0500 12/11/20 0934 12/12/20 0358 12/13/20 0305  WBC 14.4* 13.2* 13.6* 15.1*  HGB 12.0 11.7* 11.7* 11.8*  HCT 35.5* 34.7* 35.0* 33.9*  PLT 28* 26* 25* 23*    COAGS: Recent Labs    12/10/20 0559  INR 1.8*    BMP: Recent Labs    12/19/19 2057 12/09/20 1748 12/11/20 0500 12/11/20 1548 12/12/20 0358 12/12/20 1000 12/13/20 0305  NA 140   < > 132* 135 135  --  137  K 4.1   < > 3.7 3.1* 2.9* 4.2 3.2*  CL 103   < > 104  106 108  --  113*  CO2 28   < > 17* 16* 17*  --  18*  GLUCOSE 116*   < > 122* 219* 112*  --  122*  BUN 18   < > 57* 59* 52*  --  46*  CALCIUM 10.1   < > 8.1* 8.1* 8.5*  --  8.7*  CREATININE 0.85   < > 3.12* 3.01* 2.54*  --  2.24*  GFRNONAA >60   < > 17* 18* 22*  --  25*  GFRAA >60  --   --   --   --   --   --    < > = values in this interval not displayed.    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   VSS, afebrile WBC trending up, Hgb stable, thrombocytopenia RF improving, BUN 46, Creatinine 2.24, GFR 25   Right PCN intact, site clean and dry, unremarkable. Urine culture pending  Continue with flushing TID, output recording q shift and dressing changes as needed. Would consider additional imaging when output is less than 10 ml for 24 hours not including flush material.     Further treatment plan per [facility]/Urology/ PCCM   Appreciate and agree with the plan.  IR to follow.    Electronically Signed: Tera Mater, PA-C 12/13/2020, 10:06 AM   I spent a total of 15 Minutes at the the patient's bedside AND on the patient's hospital floor or unit, greater than 50% of which was counseling/coordinating care for right PCN catheter

## 2020-12-13 NOTE — Progress Notes (Signed)
PROGRESS NOTE    Melanie Madden [facility]  ZTI:[id number] DOB: [birth year] DOA: 12/09/2020 PCP: Fanny Bien, MD    Brief Narrative:  Patient is 55 year old female with history of stroke with right-sided hemiplegia and global aphasia, hypertension who was brought from home to the ER with syncopal episode.  Patient's husband reported that she passed out while trying to sit up and stand.  She fell onto the couch and EMS was called.  In the emergency room, she was hypotensive and not responding to 3 L of fluid.  Levophed was restarted.  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  Emergent percutaneous nephrostomy tube was placed by interventional radiology and admitted to intensive care unit.  Assessment & Plan:   Active Problems:   Septic shock (HCC)  Septic shock secondary to E. 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  Unfortunately, urine culture was not sent.  Blood cultures with E. Coli. 5/13, admission blood cultures with E. coli sensitive to ceftriaxone. 5/15, repeat cultures negative so far. Continue ceftriaxone.  Continue right percutaneous nephrostomy care, flushing and followed by IR.  Clinically stabilizing. Seen by urology, they will follow-up for cystoscopy and ureteroscopy and stone removal after discharge and stabilization.  Acute kidney injury secondary to sepsis and obstructive uropathy: Continue maintenance hydration.  Urine output is adequate.  Currently has a Foley catheter.  Urine output more than 2 L last 24 hours.  Appropriately improving.  Continue to monitor.  Hypokalemia: Persistent.  Replace aggressively today.  Will recheck tomorrow morning.  Also check magnesium and phosphorus.  Thrombocytopenia: Due to sepsis.   Platelets decreased but is stabilizing.  Continue to monitor.  History of a stroke with right hemiplegia: Supportive care.  Patient on a statin.  Lives at home with husband and caretaker.  Wheelchair-bound.  Will explore rehab options once able to walk with PT OT.  Hypoglycemia: Due to poor oral intake.  On maintenance dextrose, will continue today.   DVT prophylaxis: SCDs Start: 12/09/20 2336   Code Status: Full code Family Communication: Husband on the phone Disposition Plan: Status is: Inpatient  Remains inpatient appropriate because:IV treatments appropriate due to intensity of illness or inability to take PO and Inpatient level of care appropriate due to severity of illness   Dispo: The patient is from: Home              Anticipated d/c is to: Home              Patient currently is not medically stable to d/c.   Difficult to place patient No         Consultants:   Critical care  Interventional radiology  Urology  Procedures:   PCN 5/13 right kidney  Antimicrobials:  Antibiotics Given (last 72 hours)    Date/Time Action Medication Dose Rate   12/10/20 1625 New Bag/Given   cefTRIAXone (ROCEPHIN) 2 g in sodium chloride 0.9 % 100 mL IVPB 2 g 200 mL/hr   12/11/20 1843 New Bag/Given   cefTRIAXone (ROCEPHIN) 2 g in sodium chloride 0.9 % 100 mL IVPB 2 g 200 mL/hr   12/12/20 1727 New Bag/Given   cefTRIAXone (ROCEPHIN) 2 g in sodium chloride 0.9 % 100 mL IVPB 2 g 200 mL/hr         Subjective: Patient seen and examined.  Unable to  have conversation.  She nods her head and blinks her eyes.  Denies any discomfort.  Able to swallow without problem.  Afebrile last 24 hours.  Objective: Vitals:   12/13/20 0400 12/13/20 0803 12/13/20 1400 12/13/20 1409  BP: (!) 143/88 (!) 148/91  (!) 146/85  Pulse: 98 96  93  Resp: 16 16  15   Temp: 98.9 F (37.2 C) 98.6 F (37 C)  98 F (36.7 C)  TempSrc: Oral Oral    SpO2: 97% 97% 98% 97%  Weight:      Height:         Intake/Output Summary (Last 24 hours) at 12/13/2020 1433 Last data filed at 12/13/2020 1411 Gross per 24 hour  Intake 625.56 ml  Output 2715 ml  Net -2089.44 ml   Filed Weights   12/11/20 0020 12/12/20 0500 12/13/20 0126  Weight: 72.3 kg 71.8 kg 74.4 kg    Examination:  General exam: Appears calm and comfortable  Frail and debilitated.  Chronically sick looking.  Aphasic. Respiratory system: Clear to auscultation. Respiratory effort normal.  No added sounds. Cardiovascular system: S1 & S2 heard, RRR. No JVD, murmurs, rubs, gallops or clicks. No pedal edema. Gastrointestinal system: Abdomen is nondistended, soft and nontender. No organomegaly or masses felt. Normal bowel sounds heard. Right flank nephrostomy tube with minimal blood-tinged urine. Central nervous system: Alert and oriented.  Difficult to have conversation due to aphasia. Dense hemiplegia on the right side. Decreased strength on the left side. Very much debilitated.    Data Reviewed: I have personally reviewed following labs and imaging studies  CBC: Recent Labs  Lab 12/09/20 1748 12/10/20 0559 12/11/20 0500 12/11/20 0934 12/12/20 0358 12/13/20 0305  WBC 15.0* 22.4* 14.4* 13.2* 13.6* 15.1*  NEUTROABS 13.8*  --   --  12.0*  --   --   HGB 12.7 12.7 12.0 11.7* 11.7* 11.8*  HCT 38.7 39.1 35.5* 34.7* 35.0* 33.9*  MCV 92.4 91.8 89.6 89.7 89.3 85.6  PLT PLATELET CLUMPS NOTED ON SMEAR, UNABLE TO ESTIMATE 65* 28* 26* 25* 23*   Basic Metabolic Panel: Recent Labs  Lab 12/10/20 0559 12/11/20 0500 12/11/20 1548 12/12/20 0358 12/12/20 1000 12/13/20 0305  NA 133* 132* 135 135  --  137  K 4.4 3.7 3.1* 2.9* 4.2 3.2*  CL 103 104 106 108  --  113*  CO2 17* 17* 16* 17*  --  18*  GLUCOSE 177* 122* 219* 112*  --  122*  BUN 50* 57* 59* 52*  --  46*  CREATININE 3.19* 3.12* 3.01* 2.54*  --  2.24*  CALCIUM 8.2* 8.1* 8.1* 8.5*  --  8.7*  MG 1.6* 1.9 2.3 2.4  --   --   PHOS 4.2  --  3.9  --   --   --     GFR: Estimated Creatinine Clearance: 29.9 mL/min (A) (by C-G formula based on SCr of 2.24 mg/dL (H)). Liver Function Tests: Recent Labs  Lab 12/09/20 1748 12/11/20 1548  AST 38 28  ALT 35 44  ALKPHOS 128* 97  BILITOT 1.1 0.8  PROT 5.4* 5.0*  ALBUMIN 2.7* 1.9*   No results for input(s): LIPASE, AMYLASE in the last 168 hours. No results for input(s): AMMONIA in the last 168 hours. Coagulation Profile: Recent Labs  Lab 12/10/20 0559  INR 1.8*   Cardiac Enzymes: No results for input(s): CKTOTAL, CKMB, CKMBINDEX, TROPONINI in the last 168 hours. BNP (last 3 results) No results for input(s): PROBNP in the last 8760 hours. HbA1C:  No results for input(s): HGBA1C in the last 72 hours. CBG: Recent Labs  Lab 12/12/20 1915 12/13/20 0006 12/13/20 0413 12/13/20 0805 12/13/20 1210  GLUCAP 168* 131* 102* 122* 99   Lipid Profile: No results for input(s): CHOL, HDL, LDLCALC, TRIG, CHOLHDL, LDLDIRECT in the last 72 hours. Thyroid Function Tests: No results for input(s): TSH, T4TOTAL, FREET4, T3FREE, THYROIDAB in the last 72 hours. Anemia Panel: No results for input(s): VITAMINB12, FOLATE, FERRITIN, TIBC, IRON, RETICCTPCT in the last 72 hours. Sepsis Labs: Recent Labs  Lab 12/09/20 1749 12/09/20 1949 12/09/20 2336 12/10/20 0559 12/11/20 0500  PROCALCITON  --   --  99.04 108.90 93.14  LATICACIDVEN 5.6* 2.8*  --   --   --     Recent Results (from the past 240 hour(s))  Culture, blood (routine x 2)     Status: None (Preliminary result)   Collection Time: 12/09/20  9:25 PM   Specimen: BLOOD  Result Value Ref Range Status   Specimen Description BLOOD SITE NOT SPECIFIED  Final   Special Requests   Final    BOTTLES DRAWN AEROBIC AND ANAEROBIC Blood Culture results may not be optimal due to an inadequate volume of blood received in culture bottles   Culture   Final    NO GROWTH 4 DAYS Performed at [facility], [place] [address], [place], [place] [address]    Report  Status PENDING  Incomplete  Culture, blood (routine x 2)     Status: Abnormal   Collection Time: 12/09/20  9:26 PM   Specimen: BLOOD  Result Value Ref Range Status   Specimen Description BLOOD SITE NOT SPECIFIED  Final   Special Requests   Final    BOTTLES DRAWN AEROBIC AND ANAEROBIC Blood Culture results may not be optimal due to an inadequate volume of blood received in culture bottles   Culture  Setup Time (A)  Final    GRAM VARIABLE ROD IN BOTH AEROBIC AND ANAEROBIC BOTTLES CRITICAL RESULT CALLED TO, READ BACK BY AND VERIFIED WITH: [place]. [id number] FCP Performed at [facility], [place] [address], [place], [place] [address]    Culture ESCHERICHIA COLI (A)  Final   Report Status 12/12/2020 FINAL  Final   Organism ID, Bacteria ESCHERICHIA COLI  Final      Susceptibility   Escherichia coli - MIC*    AMPICILLIN <=2 SENSITIVE Sensitive     CEFAZOLIN <=4 SENSITIVE Sensitive     CEFEPIME <=0.12 SENSITIVE Sensitive     CEFTAZIDIME <=1 SENSITIVE Sensitive     CEFTRIAXONE <=0.25 SENSITIVE Sensitive     CIPROFLOXACIN <=0.25 SENSITIVE Sensitive     GENTAMICIN <=1 SENSITIVE Sensitive     IMIPENEM <=0.25 SENSITIVE Sensitive     TRIMETH/SULFA <=20 SENSITIVE Sensitive     AMPICILLIN/SULBACTAM <=2 SENSITIVE Sensitive     PIP/TAZO <=4 SENSITIVE Sensitive     * ESCHERICHIA COLI  Blood Culture ID Panel (Reflexed)     Status: Abnormal   Collection Time: 12/09/20  9:26 PM  Result Value Ref Range Status   Enterococcus faecalis NOT DETECTED NOT DETECTED Final   Enterococcus Faecium NOT DETECTED NOT DETECTED Final   Listeria monocytogenes NOT DETECTED NOT DETECTED Final   Staphylococcus species NOT DETECTED NOT DETECTED Final   Staphylococcus aureus (BCID) NOT DETECTED NOT DETECTED Final   Staphylococcus epidermidis NOT DETECTED NOT DETECTED Final   Staphylococcus lugdunensis NOT DETECTED NOT DETECTED Final   Streptococcus species NOT DETECTED NOT DETECTED Final  Streptococcus  agalactiae NOT DETECTED NOT DETECTED Final   Streptococcus pneumoniae NOT DETECTED NOT DETECTED Final   Streptococcus pyogenes NOT DETECTED NOT DETECTED Final   A.calcoaceticus-baumannii NOT DETECTED NOT DETECTED Final   Bacteroides fragilis NOT DETECTED NOT DETECTED Final   Enterobacterales DETECTED (A) NOT DETECTED Final    Comment: Enterobacterales represent a large order of gram negative bacteria, not a single organism. CRITICAL RESULT CALLED TO, READ BACK BY AND VERIFIED WITH: [place] M. [id number] FCP    Enterobacter cloacae complex NOT DETECTED NOT DETECTED Final   Escherichia coli DETECTED (A) NOT DETECTED Final    Comment: CRITICAL RESULT CALLED TO, READ BACK BY AND VERIFIED WITH: PHARMD JESSICA M. [id number] FCP    Klebsiella aerogenes NOT DETECTED NOT DETECTED Final   Klebsiella oxytoca NOT DETECTED NOT DETECTED Final   Klebsiella pneumoniae NOT DETECTED NOT DETECTED Final   Proteus species NOT DETECTED NOT DETECTED Final   Salmonella species NOT DETECTED NOT DETECTED Final   Serratia marcescens NOT DETECTED NOT DETECTED Final   Haemophilus influenzae NOT DETECTED NOT DETECTED Final   Neisseria meningitidis NOT DETECTED NOT DETECTED Final   Pseudomonas aeruginosa NOT DETECTED NOT DETECTED Final   Stenotrophomonas maltophilia NOT DETECTED NOT DETECTED Final   Candida albicans NOT DETECTED NOT DETECTED Final   Candida auris NOT DETECTED NOT DETECTED Final   Candida glabrata NOT DETECTED NOT DETECTED Final   Candida krusei NOT DETECTED NOT DETECTED Final   Candida parapsilosis NOT DETECTED NOT DETECTED Final   Candida tropicalis NOT DETECTED NOT DETECTED Final   Cryptococcus neoformans/gattii NOT DETECTED NOT DETECTED Final   CTX-M ESBL NOT DETECTED NOT DETECTED Final   Carbapenem resistance IMP NOT DETECTED NOT DETECTED Final   Carbapenem resistance KPC NOT DETECTED NOT DETECTED Final   Carbapenem resistance NDM NOT DETECTED NOT DETECTED Final   Carbapenem resist  OXA 48 LIKE NOT DETECTED NOT DETECTED Final   Carbapenem resistance VIM NOT DETECTED NOT DETECTED Final    Comment: Performed at [facility], [address] [place], [place] [address]  Resp Panel by RT-PCR (Flu A&B, Covid) Nasopharyngeal Swab     Status: None   Collection Time: 12/09/20  9:56 PM   Specimen: Nasopharyngeal Swab; Nasopharyngeal(NP) swabs in vial transport medium  Result Value Ref Range Status   SARS Coronavirus 2 by RT PCR NEGATIVE NEGATIVE Final    Comment: (NOTE) SARS-CoV-2 target nucleic acids are NOT DETECTED.  The SARS-CoV-2 RNA is generally detectable in upper respiratory specimens during the acute phase of infection. The lowest concentration of SARS-CoV-2 viral copies this assay can detect is 138 copies/mL. A negative result does not preclude SARS-Cov-2 infection and should not be used as the sole basis for treatment or other patient management decisions. A negative result may occur with  improper specimen collection/handling, submission of specimen other than nasopharyngeal swab, presence of viral mutation(s) within the areas targeted by this assay, and inadequate number of viral copies(<138 copies/mL). A negative result must be combined with clinical observations, patient history, and epidemiological information. The expected result is Negative.  Fact Sheet for Patients:  EntrepreneurPulse.com.au  Fact Sheet for Healthcare Providers:  IncredibleEmployment.be  This test is no t yet approved or cleared by the [place] FDA and  has been authorized for detection and/or diagnosis of SARS-CoV-2 by FDA under an Emergency Use Authorization (EUA). This EUA will remain  in effect (meaning this test can be used) for the duration of the COVID-19 declaration under  Section 564(b)(1) of the Act, 21 U.S.C.section 360bbb-3(b)(1), unless the authorization is terminated  or revoked sooner.       Influenza A by PCR NEGATIVE  NEGATIVE Final   Influenza B by PCR NEGATIVE NEGATIVE Final    Comment: (NOTE) The Xpert Xpress SARS-CoV-2/FLU/RSV plus assay is intended as an aid in the diagnosis of influenza from Nasopharyngeal swab specimens and should not be used as a sole basis for treatment. Nasal washings and aspirates are unacceptable for Xpert Xpress SARS-CoV-2/FLU/RSV testing.  Fact Sheet for Patients: EntrepreneurPulse.com.au  Fact Sheet for Healthcare Providers: IncredibleEmployment.be  This test is not yet approved or cleared by the [place] FDA and has been authorized for detection and/or diagnosis of SARS-CoV-2 by FDA under an Emergency Use Authorization (EUA). This EUA will remain in effect (meaning this test can be used) for the duration of the COVID-19 declaration under Section 564(b)(1) of the Act, 21 U.S.C. section 360bbb-3(b)(1), unless the authorization is terminated or revoked.  Performed at [facility], [place] [address], [place], [place] [address]   MRSA PCR Screening     Status: None   Collection Time: 12/10/20  1:11 AM   Specimen: Nasopharyngeal  Result Value Ref Range Status   MRSA by PCR NEGATIVE NEGATIVE Final    Comment:        The GeneXpert MRSA Assay (FDA approved for NASAL specimens only), is one component of a comprehensive MRSA colonization surveillance program. It is not intended to diagnose MRSA infection nor to guide or monitor treatment for MRSA infections. Performed at [facility], [place] [address], [place], [place] [address]   Culture, blood (routine x 2)     Status: None (Preliminary result)   Collection Time: 12/11/20  8:56 AM   Specimen: BLOOD LEFT HAND  Result Value Ref Range Status   Specimen Description BLOOD LEFT HAND  Final   Special Requests   Final    BOTTLES DRAWN AEROBIC ONLY Blood Culture results may not be optimal due to an inadequate volume of blood received in culture bottles   Culture   Final     NO GROWTH 2 DAYS Performed at [facility], [place] [address] [place], [place] [address]    Report Status PENDING  Incomplete  Culture, blood (routine x 2)     Status: None (Preliminary result)   Collection Time: 12/11/20  9:03 AM   Specimen: BLOOD RIGHT HAND  Result Value Ref Range Status   Specimen Description BLOOD RIGHT HAND  Final   Special Requests   Final    BOTTLES DRAWN AEROBIC AND ANAEROBIC Blood Culture results may not be optimal due to an inadequate volume of blood received in culture bottles   Culture   Final    NO GROWTH 2 DAYS Performed at [facility], [place] [address], [place], [place] [address]    Report Status PENDING  Incomplete         Radiology Studies: No results found.      Scheduled Meds: . atorvastatin  20 mg Oral Daily  . Chlorhexidine Gluconate Cloth  6 each Topical Daily  . feeding supplement  1 Container Oral TID BM  . insulin aspart  0-6 Units Subcutaneous Q4H  . mouth rinse  15 mL Mouth Rinse BID  . potassium chloride  40 mEq Oral BID  . sertraline  50 mg Oral Daily  . sodium chloride flush  10-40 mL Intracatheter Q12H   Continuous Infusions: . cefTRIAXone (  ROCEPHIN)  IV Stopped (12/12/20 1758)  . dextrose Stopped (12/12/20 1725)     LOS: 4 days    Time spent: 35 minutes    Barb Merino, MD [facility] Pager [phone]

## 2020-12-14 DIAGNOSIS — N201 Calculus of ureter: Secondary | ICD-10-CM

## 2020-12-14 DIAGNOSIS — K828 Other specified diseases of gallbladder: Secondary | ICD-10-CM | POA: Diagnosis not present

## 2020-12-14 DIAGNOSIS — N12 Tubulo-interstitial nephritis, not specified as acute or chronic: Secondary | ICD-10-CM | POA: Diagnosis not present

## 2020-12-14 DIAGNOSIS — A419 Sepsis, unspecified organism: Secondary | ICD-10-CM | POA: Diagnosis not present

## 2020-12-14 LAB — CULTURE, BLOOD (ROUTINE X 2): Culture: NO GROWTH

## 2020-12-14 LAB — CBC
HCT: 34.4 % — ABNORMAL LOW (ref 36.0–46.0)
Hemoglobin: 11.8 g/dL — ABNORMAL LOW (ref 12.0–15.0)
MCH: 29.4 pg (ref 26.0–34.0)
MCHC: 34.3 g/dL (ref 30.0–36.0)
MCV: 85.8 fL (ref 80.0–100.0)
Platelets: 30 10*3/uL — ABNORMAL LOW (ref 150–400)
RBC: 4.01 MIL/uL (ref 3.87–5.11)
RDW: 14.5 % (ref 11.5–15.5)
WBC: 15.6 10*3/uL — ABNORMAL HIGH (ref 4.0–10.5)
nRBC: 0 % (ref 0.0–0.2)

## 2020-12-14 LAB — BASIC METABOLIC PANEL
Anion gap: 6 (ref 5–15)
BUN: 45 mg/dL — ABNORMAL HIGH (ref 6–20)
CO2: 18 mmol/L — ABNORMAL LOW (ref 22–32)
Calcium: 8.7 mg/dL — ABNORMAL LOW (ref 8.9–10.3)
Chloride: 112 mmol/L — ABNORMAL HIGH (ref 98–111)
Creatinine, Ser: 1.69 mg/dL — ABNORMAL HIGH (ref 0.44–1.00)
GFR, Estimated: 35 mL/min — ABNORMAL LOW (ref >60)
Glucose, Bld: 123 mg/dL — ABNORMAL HIGH (ref 70–99)
Potassium: 3.8 mmol/L (ref 3.5–5.1)
Sodium: 136 mmol/L (ref 135–145)

## 2020-12-14 LAB — GLUCOSE, CAPILLARY
Glucose-Capillary: 112 mg/dL — ABNORMAL HIGH (ref 70–99)
Glucose-Capillary: 119 mg/dL — ABNORMAL HIGH (ref 70–99)
Glucose-Capillary: 143 mg/dL — ABNORMAL HIGH (ref 70–99)
Glucose-Capillary: 178 mg/dL — ABNORMAL HIGH (ref 70–99)
Glucose-Capillary: 204 mg/dL — ABNORMAL HIGH (ref 70–99)
Glucose-Capillary: 263 mg/dL — ABNORMAL HIGH (ref 70–99)

## 2020-12-14 LAB — PHOSPHORUS: Phosphorus: 2 mg/dL — ABNORMAL LOW (ref 2.5–4.6)

## 2020-12-14 LAB — MAGNESIUM: Magnesium: 1.9 mg/dL (ref 1.7–2.4)

## 2020-12-14 MED ORDER — INSULIN ASPART 100 UNIT/ML IJ SOLN
0.0000 [IU] | Freq: Three times a day (TID) | INTRAMUSCULAR | Status: DC
  Administered 2020-12-15: 2 [IU] via SUBCUTANEOUS
  Administered 2020-12-15: 3 [IU] via SUBCUTANEOUS
  Administered 2020-12-16: 1 [IU] via SUBCUTANEOUS

## 2020-12-14 MED ORDER — INSULIN ASPART 100 UNIT/ML IJ SOLN
0.0000 [IU] | Freq: Every day | INTRAMUSCULAR | Status: DC
  Administered 2020-12-14: 2 [IU] via SUBCUTANEOUS

## 2020-12-14 MED ORDER — INSULIN ASPART 100 UNIT/ML IJ SOLN: 0.0000 [IU] | Freq: Three times a day (TID) | INTRAMUSCULAR | Status: DC

## 2020-12-14 MED ORDER — SODIUM PHOSPHATES 45 MMOLE/15ML IV SOLN
20.0000 mmol | Freq: Once | INTRAVENOUS | Status: AC
  Administered 2020-12-14: 20 mmol via INTRAVENOUS
  Filled 2020-12-14: qty 6.67

## 2020-12-14 NOTE — Evaluation (Signed)
Occupational Therapy Evaluation Patient Details Name: Melanie Madden MRN: [id number] DOB: 01-Jul-1966 Today's Date: 12/14/2020    History of Present Illness Patient is 55 year old female  who was brought from home to the ER with syncopal episode, pt found to have septic shock from  infection emphysematous pyelonephritis with infected kidney stones. Pt taken to IR for percutaneous nephrostomy tube.  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 PTA pt was completing ADL's with supervision and functional mobility using a quad cane. At the time of this evaluation, pt requires max +2 to complete sit<>stands and her ADL performance is limited due to increase in weakness on the non-stroke affected side. Due to global aphasia pt was unable to assist in setting goals for herself, all goals created for her are based off of her prior levels according to her chart and husband. OT will update goals in accordance with her abilities with further assessment. Acute OT will continue to follow up and address functional mobility and ADL performance.     Follow Up Recommendations  CIR    Equipment Recommendations  None recommended by OT    Recommendations for Other Services Rehab consult     Precautions / Restrictions Precautions Precautions: Fall Precaution Comments: global aphasic, residual R hemiplegia Restrictions Weight Bearing Restrictions: No      Mobility Bed Mobility               General bed mobility comments: Up in chair on arrival    Transfers Overall transfer level: Needs assistance Equipment used: 2 person hand held assist Transfers: Sit to/from Stand Sit to Stand: Max assist;+2 physical assistance;+2 safety/equipment         General transfer comment: Pt required max +2 to power up from recliner. Attempted to take a step, with +2 handheld assist, pt having  difficulty advancing her RLE due to flaccidity.    Balance Overall balance assessment: Needs assistance Sitting-balance support: Feet supported Sitting balance-Leahy Scale: Fair     Standing balance support: Bilateral upper extremity supported Standing balance-Leahy Scale: Poor Standing balance comment: dependent on physical assist                           ADL either performed or assessed with clinical judgement   ADL Overall ADL's : Needs assistance/impaired Eating/Feeding: Set up;Sitting Eating/Feeding Details (indicate cue type and reason): able to bring cup to mouth to drin Grooming: Wash/dry face;Wash/dry hands;Set up;Sitting Grooming Details (indicate cue type and reason): completed sitting in recliner Upper Body Bathing: Moderate assistance;Sitting Upper Body Bathing Details (indicate cue type and reason): Pt needs assist due to RUE flaccidity Lower Body Bathing: Maximal assistance;Sitting/lateral leans;Sit to/from stand Lower Body Bathing Details (indicate cue type and reason): Pt unable to complete LB bathing with out max assit for pericare and reaching both feet Upper Body Dressing : Minimal assistance;Sitting Upper Body Dressing Details (indicate cue type and reason): Min assist due to RUE hemiplegia Lower Body Dressing: Maximal assistance;Sitting/lateral leans Lower Body Dressing Details (indicate cue type and reason): Pt requires max assist to bring feet into figure 4, then has difficulty maintaining posture to complete dressing. Toilet Transfer: Maximal assistance;+2 for physical assistance;+2 for safety/equipment;Stand-pivot;BSC;RW Toilet Transfer Details (indicate cue type and reason): Pt requires max +2 to stand and due to RLE hemiplegia, pt has difficulty pivoting once in standing Toileting- Clothing Manipulation and Hygiene:  Total assistance;+2 for physical assistance;Sitting/lateral lean;Sit to/from stand Toileting - Clothing Manipulation Details  (indicate cue type and reason): Pt requires total assist due to balance and RUE hemiplegia Tub/ Shower Transfer: Maximal assistance;+2 for physical assistance;+2 for safety/equipment;Stand-pivot;Tub bench Tub/Shower Transfer Details (indicate cue type and reason): Pt requires max assist +2 to stand and unable to pivot without max A +2 due to RLE hemiplegia Functional mobility during ADLs: Maximal assistance;+2 for physical assistance;+2 for safety/equipment;Rolling walker General ADL Comments: Pt requires max +2 for standing and attempting to take a step. Pt able to wiggle RLE in standing, but having difficulties taking steps at this time. Pt requiring min-max assist for all dressing and bathing, and setup to min assist for grooming.     Vision   Vision Assessment?: No apparent visual deficits Additional Comments: No information on pt's vision in her chart. Pt shook her head no then yes when asked if  she wore glasses. No apparent deficits at this time.     Perception Perception Perception Tested?: No   Praxis Praxis Praxis tested?: Not tested    Pertinent Vitals/Pain Pain Assessment: No/denies pain     Hand Dominance Right   Extremity/Trunk Assessment Upper Extremity Assessment Upper Extremity Assessment: RUE deficits/detail RUE Deficits / Details: hemiplegia, no active movement RUE Sensation: decreased light touch RUE Coordination: decreased fine motor;decreased gross motor   Lower Extremity Assessment Lower Extremity Assessment: Defer to PT evaluation   Cervical / Trunk Assessment Cervical / Trunk Assessment: Normal (has perc drain)   Communication Communication Communication: Receptive difficulties (Pt is non-verbal since stroke. Per chart pt is globally aphasic)   Cognition Arousal/Alertness: Awake/alert Behavior During Therapy: WFL for tasks assessed/performed Overall Cognitive Status: History of cognitive impairments - at baseline                                  General Comments: pt globally aphasic, had difficulty answering questions via head nodding when asked orientation questions, pt did follow all commands provide great effort   General Comments  VSS on RA    Exercises     Shoulder Instructions      Home Living Family/patient expects to be discharged to:: Private residence Living Arrangements: Spouse/significant other Available Help at Discharge: Family;Available 24 hours/day;Personal care attendant Type of Home: House Home Access: Stairs to enter [employer] of Steps: 2-3 Entrance Stairs-Rails: None Home Layout: Able to live on main level with bedroom/bathroom;Two level Alternate Level Stairs-Number of Steps: flight   Bathroom Shower/Tub: Teacher, early years/pre: Standard Bathroom Accessibility: Yes   Home Equipment: Tub bench;Wheelchair - manual;Cane - quad;Grab bars - toilet;Grab bars - tub/shower;Bedside commode          Prior Functioning/Environment Level of Independence: Needs assistance  Gait / Transfers Assistance Needed: able to amb with quad cane short distances, w/c for long distances, able to independently get in/out of chair lift and buckling self in ADL's / Homemaking Assistance Needed: supervision   Comments: pt also going to outpt PT 2x/wk        OT Problem List: Decreased strength;Decreased range of motion;Decreased activity tolerance;Impaired balance (sitting and/or standing);Decreased coordination;Decreased cognition;Decreased safety awareness;Decreased knowledge of use of DME or AE;Impaired sensation;Impaired tone;Impaired UE functional use      OT Treatment/Interventions: Self-care/ADL training;Therapeutic exercise;Neuromuscular education;Energy conservation;DME and/or AE instruction;Therapeutic activities;Cognitive remediation/compensation;Patient/family education;Balance training    OT Goals(Current goals can be found in the care plan section) Acute  Rehab OT  Goals Patient Stated Goal: didn't state OT Goal Formulation: Patient unable to participate in goal setting Time For Goal Achievement: 12/28/20 Potential to Achieve Goals: Good ADL Goals Pt Will Perform Grooming: with mod assist;standing Pt Will Transfer to Toilet: with min guard assist;stand pivot transfer;bedside commode Additional ADL Goal #1: Pt will tolerate standing for 3 mins in preparation for grooming activities at the sink Additional ADL Goal #2: Pt will ambulate 5 ft with 1 person handheld assist or use of quad cane.  OT Frequency: Min 2X/week   Barriers to D/C:            Co-evaluation              AM-PAC OT "6 Clicks" Daily Activity     Outcome Measure Help from another person eating meals?: A Little Help from another person taking care of personal grooming?: A Little Help from another person toileting, which includes using toliet, bedpan, or urinal?: A Lot Help from another person bathing (including washing, rinsing, drying)?: A Lot Help from another person to put on and taking off regular upper body clothing?: A Little Help from another person to put on and taking off regular lower body clothing?: A Lot 6 Click Score: 15   End of Session Equipment Utilized During Treatment: Rolling walker Nurse Communication: Mobility status (Needing asistance with sit to stand)  Activity Tolerance: Patient tolerated treatment well Patient left: in chair;with call bell/phone within reach;with chair alarm set  OT Visit Diagnosis: Unsteadiness on feet (R26.81);Muscle weakness (generalized) (M62.81);Ataxia, unspecified (R27.0);Apraxia (R48.2)                Time: 1062-6948 OT Time Calculation (min): 20 min Charges:  OT General Charges $OT Visit: 1 Visit OT Evaluation $OT Eval Moderate Complexity: [place]., OTR/L Acute Rehabilitation  [place] 12/14/2020, 3:32 PM

## 2020-12-14 NOTE — Evaluation (Signed)
Physical Therapy Evaluation Patient Details Name: Melanie Madden MRN: [id number] DOB: 10-07-1965 Today's Date: 12/14/2020   History of Present Illness  Patient is 55 year old female  who was brought from home to the ER with syncopal episode, pt found to have septic shock from  infection emphysematous pyelonephritis with infected kidney stones. Pt taken to IR for percutaneous nephrostomy tube.  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 Pt with recent stroke presenting with residual R hemiplegia and global aphasia. Pt was walking short distances with quad cane, indep with ADLs, and working with outpt PT 2x/wk PTA. Pt now requiring assist x2 for transfers and is demonstrating deconditioning. Pt with good home set up and 24/7 assist. Recommend CIR upon d/c for aggressive therapy to return to PLOF. Acute PT to cont to follow.    Follow Up Recommendations CIR    Equipment Recommendations  None recommended by PT    Recommendations for Other Services Rehab consult     Precautions / Restrictions Precautions Precautions: Fall Precaution Comments: global aphasic, residual R hemiplegia Restrictions Weight Bearing Restrictions: No      Mobility  Bed Mobility Overal bed mobility: Needs Assistance Bed Mobility: Supine to Sit     Supine to sit: Min assist;Mod assist;+2 for physical assistance;HOB elevated     General bed mobility comments: max directional verbal cues, modA for R LE management, min/modA for trunk elevation    Transfers Overall transfer level: Needs assistance Equipment used: 2 person hand held assist (2 person assist with gait belt and bed pad) Transfers: Sit to/from [employer] Transfers Sit to Stand: Mod assist;+2 physical assistance Stand pivot transfers: Mod assist;+2 physical assistance       General transfer comment: pt powered up well but required modA at R knee to  prevent buckling, pt advanced L foot during transfer but required mod/maxA for R LE management during std pvt transfer due to flaccidity and buckling  Ambulation/Gait             General Gait Details: unable this date  Stairs            Wheelchair Mobility    Modified Rankin (Stroke Patients Only)       Balance Overall balance assessment: Needs assistance Sitting-balance support: Feet supported;Single extremity supported Sitting balance-Leahy Scale: Fair Sitting balance - Comments: pt propped self up with L UE   Standing balance support: Single extremity supported Standing balance-Leahy Scale: Poor Standing balance comment: dependent on physical assist                             Pertinent Vitals/Pain Pain Assessment:  (pt shook head no when asked if she was in pain)    Home Living Family/patient expects to be discharged to:: Private residence Living Arrangements: Spouse/significant other Available Help at Discharge: Family;Available 24 hours/day;Personal care attendant (caregiver 68a-430p M-F, spouse works during the day) Type of Home: House Home Access: Stairs to enter Entrance Stairs-Rails: None Entrance Stairs-Number of Steps: 2-3 Home Layout: Able to live on main level with bedroom/bathroom;Two level Home Equipment: Tub bench;Wheelchair - manual;Cane - quad;Grab bars - toilet;Grab bars - tub/shower;Bedside commode      Prior Function Level of Independence: Needs assistance   Gait / Transfers Assistance Needed: able to amb with quad cane short distances, w/c for long distances, able to independently get in/out of chair lift and buckling self in  ADL's / Homemaking Assistance Needed: supervision  Comments: pt also going to outpt PT 2x/wk     Hand Dominance   Dominant Hand: Right    Extremity/Trunk Assessment   Upper Extremity Assessment Upper Extremity Assessment: RUE deficits/detail RUE Deficits / Details: hemiplegia, no active  movement    Lower Extremity Assessment Lower Extremity Assessment: RLE deficits/detail RLE Deficits / Details: residual hemiplegia, trace voluntary movement    Cervical / Trunk Assessment Cervical / Trunk Assessment: Other exceptions (has perc drain)  Communication   Communication: Receptive difficulties (pt non-verbal since stroke, per chart pt globally aphasic)  Cognition Arousal/Alertness: Awake/alert Behavior During Therapy: WFL for tasks assessed/performed Overall Cognitive Status: History of cognitive impairments - at baseline                                 General Comments: pt globally aphasic, had difficulty answering questions via head nodding when asked orientation questions, pt did follow all commands provide great effort      General Comments General comments (skin integrity, edema, etc.): pt with noted L UE swelling, rings on fingers very tight, RN Notived, pt sweaty, BP 157/95, map 112, SpO2 97% on RA    Exercises General Exercises - Lower Extremity Ankle Circles/Pumps: AROM;Left;10 reps;Seated (PROM on R) Long Arc Quad: AROM;Left;10 reps;Seated (prom on R)   Assessment/Plan    PT Assessment Patient needs continued PT services  PT Problem List Decreased strength;Decreased activity tolerance;Decreased balance;Decreased mobility;Decreased coordination;Decreased cognition;Decreased knowledge of use of DME;Decreased safety awareness       PT Treatment Interventions DME instruction;Gait training;Stair training;Functional mobility training;Therapeutic activities;Therapeutic exercise;Balance training;Neuromuscular re-education;Cognitive remediation    PT Goals (Current goals can be found in the Care Plan section)  Acute Rehab PT Goals Patient Stated Goal: didn't state PT Goal Formulation: With patient Time For Goal Achievement: 12/28/20 Potential to Achieve Goals: Good    Frequency Min 3X/week   Barriers to discharge        Co-evaluation                AM-PAC PT "6 Clicks" Mobility  Outcome Measure Help needed turning from your back to your side while in a flat bed without using bedrails?: A Lot Help needed moving from lying on your back to sitting on the side of a flat bed without using bedrails?: A Lot Help needed moving to and from a bed to a chair (including a wheelchair)?: A Lot Help needed standing up from a chair using your arms (e.g., wheelchair or bedside chair)?: A Lot Help needed to walk in hospital room?: A Lot Help needed climbing 3-5 steps with a railing? : Total 6 Click Score: 11    End of Session Equipment Utilized During Treatment: Gait belt Activity Tolerance: Patient tolerated treatment well Patient left: in chair;with call bell/phone within reach;with chair alarm set Nurse Communication: Mobility status PT Visit Diagnosis: Unsteadiness on feet (R26.81);Muscle weakness (generalized) (M62.81);Difficulty in walking, not elsewhere classified (R26.2)    Time: 4098-1191 PT Time Calculation (min) (ACUTE ONLY): 38 min   Charges:   PT Evaluation $PT Eval Moderate Complexity: 1 Mod PT Treatments $Therapeutic Activity: 8-22 mins        Kittie Plater, PT, DPT Acute Rehabilitation Services Pager #: [phone] Office #: [phone]   Berline Lopes 12/14/2020, 10:54 AM

## 2020-12-14 NOTE — Plan of Care (Signed)

## 2020-12-14 NOTE — Progress Notes (Signed)
Referring Physician(s): Dr. Lovena Neighbours, C.   Supervising Physician: Ruthann Cancer  Patient Status:  [facility] - In-pt  Chief Complaint:  S/p right PCN catheter placement on 5/13 with Dr. Pascal Lux   Subjective:  Pt sitting in a recliner, NT at the bedside.  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 ROS was not performed.   Allergies: Amoxicillin and Shellfish allergy  Medications: Prior to Admission medications   Medication Sig Start Date End Date Taking? Authorizing Provider  amLODipine (NORVASC) 10 MG tablet Take 10 mg by mouth daily.   Yes [provider]  atorvastatin (LIPITOR) 20 MG tablet Take 1 tablet (20 mg total) by mouth daily at 6 PM. 08/20/19  Yes Angiulli, Lavon Paganini, PA-C  B Complex-C (B-COMPLEX WITH VITAMIN C) tablet Take 1 tablet by mouth daily.   Yes [provider]  CALCIUM PO Take 1 tablet by mouth daily.   Yes [provider]  cholecalciferol (VITAMIN D) 1000 units tablet Take 1,000 Units by mouth daily. 2,000 units daily   Yes [provider]  docusate sodium (COLACE) 100 MG capsule Take 300 mg by mouth daily.   Yes [provider]  magnesium 30 MG tablet Take 30 mg by mouth daily.   Yes [provider]  Omega-3 Fatty Acids (FISH OIL) 1200 MG CPDR Take 1 capsule by mouth daily.   Yes [provider]  QUEtiapine (SEROQUEL) 50 MG tablet Take 1 tablet (50 mg total) by mouth 2 (two) times daily. 11/10/20  Yes Kirsteins, [name], MD  sertraline (ZOLOFT) 50 MG tablet Take 1 tablet (50 mg total) by mouth daily. 11/10/20  Yes Kirsteins, [name], MD  VITAMIN E PO Take 1 tablet by mouth daily.    Yes [provider]  zinc sulfate 220 (50 Zn) MG capsule Take 220 mg by mouth daily.   Yes [provider]  gabapentin (NEURONTIN) 100 MG capsule Take 1 capsule (100 mg total) by mouth 3 (three) times daily. Patient not taking: Reported on 12/09/2020 05/06/20   Charlett Blake, MD   LORazepam (ATIVAN) 1 MG tablet Take 1 tablet (1 mg total) by mouth every 8 (eight) hours as needed for anxiety. Patient not taking: Reported on 12/09/2020 08/29/19   Milton Ferguson, MD     Vital Signs: BP (!) 155/81 (BP Location: Left Arm)   Pulse 88   Temp 98.3 F (36.8 C) (Oral)   Resp 18   Ht 5\' 7"  (1.702 m)   Wt 165 lb 5.5 oz (75 kg)   SpO2 97%   BMI 25.90 kg/m   Physical Exam Vitals reviewed.  Constitutional:      General: She is not in acute distress. HENT:     Head: Normocephalic and atraumatic.  Cardiovascular:     Rate and Rhythm: Normal rate.  Pulmonary:     Effort: Pulmonary effort is normal.  Abdominal:     General: Abdomen is flat.     Palpations: Abdomen is soft.     Comments: Positive right PCN drain to a gravity bag. Site is unremarkable with no erythema, edema, tenderness, bleeding or drainage. Suture and stat lock in place. Dressing is clean, dry, and intact. 200 mL of blood tinged fluid noted in the gravity bag. Drain aspirates and flushes well.   Skin:    General: Skin is warm and dry.  Neurological:     Mental Status: Mental status is at baseline.     Imaging: CT HEAD WO  CONTRAST  Result Date: 12/11/2020 CLINICAL DATA:  Mental status changes.  History of CVA. EXAM: CT HEAD WITHOUT CONTRAST TECHNIQUE: Contiguous axial images were obtained from the base of the skull through the vertex without intravenous contrast. COMPARISON:  Multiple prior studies including 12/29/2019 and Jul 28, 2019 FINDINGS: Brain: There is persistent encephalomalacia of the LEFT frontal lobe, temporal lobe, LEFT corona radiata, and LEFT thalamus consistent with remote intracranial hemorrhage. There is mild central and cortical atrophy. Periventricular white matter changes are consistent with small vessel disease. A 7 millimeter extra-axial calcified mass in the LEFT frontal lobe likely represents a small meningioma and is not associated with surrounding edema. Vascular: No hyperdense  vessel or unexpected calcification. Skull: Normal. Negative for fracture or focal lesion. Sinuses/Orbits: No acute finding. Other: None IMPRESSION: 1. Remote changes consistent with previous intracranial hemorrhage. 2.  No evidence for acute  abnormality. 3. Atrophy and small vessel disease. 4. 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 Electronically Signed   By: Nolon Nations M.D.   On: 12/11/2020 10:43    Labs:  CBC: Recent Labs    12/11/20 0934 12/12/20 0358 12/13/20 0305 12/14/20 0229  WBC 13.2* 13.6* 15.1* 15.6*  HGB 11.7* 11.7* 11.8* 11.8*  HCT 34.7* 35.0* 33.9* 34.4*  PLT 26* 25* 23* 30*    COAGS: Recent Labs    12/10/20 0559  INR 1.8*    BMP: Recent Labs    12/19/19 2057 12/09/20 1748 12/11/20 1548 12/12/20 0358 12/12/20 1000 12/13/20 0305 12/14/20 0229  NA 140   < > 135 135  --  137 136  K 4.1   < > 3.1* 2.9* 4.2 3.2* 3.8  CL 103   < > 106 108  --  113* 112*  CO2 28   < > 16* 17*  --  18* 18*  GLUCOSE 116*   < > 219* 112*  --  122* 123*  BUN 18   < > 59* 52*  --  46* 45*  CALCIUM 10.1   < > 8.1* 8.5*  --  8.7* 8.7*  CREATININE 0.85   < > 3.01* 2.54*  --  2.24* 1.69*  GFRNONAA >60   < > 18* 22*  --  25* 35*  GFRAA >60  --   --   --   --   --   --    < > = values in this interval not displayed.    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   Afebrile WBC trending up, Hgb stable, thrombocytopenia improving RF improving, BUN 46, Creatinine 2.24, GFR 25   Right PCN intact, site clean and dry, flushes/aspirates well. Urine culture  pending  Continue with flushing TID, output recording q shift and dressing changes as needed. Would consider additional imaging when output is less than 10 ml for 24 hours not including flush material.     Further treatment plan per [facility]/Urology/ PCCM   Appreciate and agree with the plan.  IR to follow.    Electronically Signed: Tera Mater, PA-C 12/14/2020, 12:13 PM   I  spent a total of 15 Minutes at the the patient's bedside AND on the patient's hospital floor or unit, greater than 50% of which was counseling/coordinating care for right PCN catheter

## 2020-12-14 NOTE — Progress Notes (Signed)
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  In the ER she was found to be hypotensive and did not respond to a 3 L fluid bolus.  Levophed was initiated.  CT abdomen and pelvis noted emphysematous pyelitis and obstructive hydronephrosis.  Patient required emergent percutaneous nephrostomy tube by IR and was admitted to the ICU.  Consultants:  Urology PCCM IR  Code Status: FULL CODE  Antimicrobials:  Cefepime 5/13 Flagyl 5/13 Vanc 5/13 Zosyn 5/14 Ceftriaxone 5/14 >  DVT prophylaxis: SCDs  Subjective: Afebrile.  Vital signs stable.  Indicates to me that she is very tired but denies having any new complaints.  Appears comfortable sitting up in bedside chair.  Assessment & Plan:  Septic shock POA -E. coli bacteremia -emphysematous pyelonephritis with infected kidney stones Pan sensitive E. coli noted on blood culture -continue ceftriaxone -clinically appears to be improving  Right obstructing kidney stone -obstructive uropathy with acute kidney failure Right percutaneous nephrostomy to remain in place -outpatient follow-up for cystoscopy ureteroscopy and stone removal -renal function improving each day  Hypokalemia Corrected with supplementation  Hypophosphatemia Supplement follow   Thrombocytopenia Due to gram-negative rod sepsis -stabilizing -recheck in a.m.  Hypoglycemia Due to limited/poor intake -monitor trend  Prior stroke with right hemiplegia Continue therapy   Family Communication:  Status is: Inpatient  Remains inpatient appropriate because:Inpatient level of care appropriate due to severity of illness   Dispo: The patient is from: Home              Anticipated d/c is to: CIR               Patient currently is not medically stable to d/c.   Difficult to place patient No  Objective: Blood pressure (!) 155/81, pulse 88, temperature 98.3 F (36.8 C), temperature source Oral, resp. rate 18, height 5\' 7"  (1.702 m), weight 75 kg, SpO2 97 %.  Intake/Output Summary (Last 24 hours) at 12/14/2020 1005 Last data filed at 12/14/2020 0528 Gross per 24 hour  Intake 630 ml  Output 2075 ml  Net -1445 ml   Filed Weights   12/12/20 0500 12/13/20 0126 12/14/20 0500  Weight: 71.8 kg 74.4 kg 75 kg    Examination: General: No acute respiratory distress Lungs: Clear to auscultation bilaterally without wheezes or crackles Cardiovascular: Regular rate and rhythm without murmur gallop or rub normal S1 and S2 Abdomen: Nontender, nondistended, soft, bowel sounds positive, no rebound, no ascites, no appreciable mass Extremities: No significant cyanosis, clubbing, or edema bilateral lower extremities  CBC: Recent Labs  Lab 12/09/20 1748 12/10/20 0559 12/11/20 0934 12/12/20 0358 12/13/20 0305 12/14/20 0229  WBC 15.0*   < > 13.2* 13.6* 15.1* 15.6*  NEUTROABS 13.8*  --  12.0*  --   --   --   HGB 12.7   < > 11.7* 11.7* 11.8* 11.8*  HCT 38.7   < > 34.7* 35.0* 33.9* 34.4*  MCV 92.4   < > 89.7 89.3 85.6 85.8  PLT PLATELET CLUMPS NOTED ON SMEAR, UNABLE TO ESTIMATE   < > 26* 25* 23* 30*   < > = values in this interval not displayed.   Basic Metabolic Panel: Recent Labs  Lab 12/10/20 0559 12/11/20 0500 12/11/20 1548 12/12/20 0358 12/12/20  1000 12/13/20 0305 12/14/20 0229  NA 133*   < > 135 135  --  137 136  K 4.4   < > 3.1* 2.9* 4.2 3.2* 3.8  CL 103   < > 106 108  --  113* 112*  CO2 17*   < > 16* 17*  --  18* 18*  GLUCOSE 177*   < > 219* 112*  --  122* 123*  BUN 50*   < > 59* 52*  --  46* 45*  CREATININE 3.19*   < > 3.01* 2.54*  --  2.24* 1.69*  CALCIUM 8.2*   < > 8.1* 8.5*  --  8.7* 8.7*  MG 1.6*   < > 2.3 2.4  --   --  1.9  PHOS 4.2  --  3.9  --   --   --  2.0*   < > =  values in this interval not displayed.   GFR: Estimated Creatinine Clearance: 39.8 mL/min (A) (by C-G formula based on SCr of 1.69 mg/dL (H)).  Liver Function Tests: Recent Labs  Lab 12/09/20 1748 12/11/20 1548  AST 38 28  ALT 35 44  ALKPHOS 128* 97  BILITOT 1.1 0.8  PROT 5.4* 5.0*  ALBUMIN 2.7* 1.9*    Coagulation Profile: Recent Labs  Lab 12/10/20 0559  INR 1.8*    HbA1C: Hgb A1c MFr Bld  Date/Time Value Ref Range Status  12/09/2020 01:11 AM 5.2 4.8 - 5.6 % Final    Comment:    (NOTE) Pre diabetes:          5.7%-6.4%  Diabetes:              >6.4%  Glycemic control for   <7.0% adults with diabetes   07/10/2019 11:45 AM 5.3 4.8 - 5.6 % Final    Comment:    (NOTE) Pre diabetes:          5.7%-6.4% Diabetes:              >6.4% Glycemic control for   <7.0% adults with diabetes     CBG: Recent Labs  Lab 12/13/20 1749 12/13/20 2005 12/14/20 0046 12/14/20 0416 12/14/20 0816  GLUCAP 134* 157* 143* 119* 112*    Recent Results (from the past 240 hour(s))  Culture, blood (routine x 2)     Status: None   Collection Time: 12/09/20  9:25 PM   Specimen: BLOOD  Result Value Ref Range Status   Specimen Description BLOOD SITE NOT SPECIFIED  Final   Special Requests   Final    BOTTLES DRAWN AEROBIC AND ANAEROBIC Blood Culture results may not be optimal due to an inadequate volume of blood received in culture bottles   Culture   Final    NO GROWTH 5 DAYS Performed at [facility], [place] [address], [name],  [address]    Report Status 12/14/2020 FINAL  Final  Culture, blood (routine x 2)     Status: Abnormal   Collection Time: 12/09/20  9:26 PM   Specimen: BLOOD  Result Value Ref Range Status   Specimen Description BLOOD SITE NOT SPECIFIED  Final   Special Requests   Final    BOTTLES DRAWN AEROBIC AND ANAEROBIC Blood Culture results may not be optimal due to an inadequate volume of blood received in culture bottles   Culture  Setup Time (A)  Final     GRAM VARIABLE ROD IN BOTH AEROBIC AND ANAEROBIC BOTTLES CRITICAL RESULT CALLED TO, READ BACK BY  AND VERIFIED WITH: Lyndel Safe [id number] FCP Performed at [facility], [place] [address], [place], [place] [address]    Culture ESCHERICHIA COLI (A)  Final   Report Status 12/12/2020 FINAL  Final   Organism ID, Bacteria ESCHERICHIA COLI  Final      Susceptibility   Escherichia coli - MIC*    AMPICILLIN <=2 SENSITIVE Sensitive     CEFAZOLIN <=4 SENSITIVE Sensitive     CEFEPIME <=0.12 SENSITIVE Sensitive     CEFTAZIDIME <=1 SENSITIVE Sensitive     CEFTRIAXONE <=0.25 SENSITIVE Sensitive     CIPROFLOXACIN <=0.25 SENSITIVE Sensitive     GENTAMICIN <=1 SENSITIVE Sensitive     IMIPENEM <=0.25 SENSITIVE Sensitive     TRIMETH/SULFA <=20 SENSITIVE Sensitive     AMPICILLIN/SULBACTAM <=2 SENSITIVE Sensitive     PIP/TAZO <=4 SENSITIVE Sensitive     * ESCHERICHIA COLI  Blood Culture ID Panel (Reflexed)     Status: Abnormal   Collection Time: 12/09/20  9:26 PM  Result Value Ref Range Status   Enterococcus faecalis NOT DETECTED NOT DETECTED Final   Enterococcus Faecium NOT DETECTED NOT DETECTED Final   Listeria monocytogenes NOT DETECTED NOT DETECTED Final   Staphylococcus species NOT DETECTED NOT DETECTED Final   Staphylococcus aureus (BCID) NOT DETECTED NOT DETECTED Final   Staphylococcus epidermidis NOT DETECTED NOT DETECTED Final   Staphylococcus lugdunensis NOT DETECTED NOT DETECTED Final   Streptococcus species NOT DETECTED NOT DETECTED Final   Streptococcus agalactiae NOT DETECTED NOT DETECTED Final   Streptococcus pneumoniae NOT DETECTED NOT DETECTED Final   Streptococcus pyogenes NOT DETECTED NOT DETECTED Final   A.calcoaceticus-baumannii NOT DETECTED NOT DETECTED Final   Bacteroides fragilis NOT DETECTED NOT DETECTED Final   Enterobacterales DETECTED (A) NOT DETECTED Final    Comment: Enterobacterales represent a large order of gram negative bacteria, not a single  organism. CRITICAL RESULT CALLED TO, READ BACK BY AND VERIFIED WITH: [place] M. [id number] FCP    Enterobacter cloacae complex NOT DETECTED NOT DETECTED Final   Escherichia coli DETECTED (A) NOT DETECTED Final    Comment: CRITICAL RESULT CALLED TO, READ BACK BY AND VERIFIED WITH: [place] M. [id number] FCP    Klebsiella aerogenes NOT DETECTED NOT DETECTED Final   Klebsiella oxytoca NOT DETECTED NOT DETECTED Final   Klebsiella pneumoniae NOT DETECTED NOT DETECTED Final   Proteus species NOT DETECTED NOT DETECTED Final   Salmonella species NOT DETECTED NOT DETECTED Final   Serratia marcescens NOT DETECTED NOT DETECTED Final   Haemophilus influenzae NOT DETECTED NOT DETECTED Final   Neisseria meningitidis NOT DETECTED NOT DETECTED Final   Pseudomonas aeruginosa NOT DETECTED NOT DETECTED Final   Stenotrophomonas maltophilia NOT DETECTED NOT DETECTED Final   Candida albicans NOT DETECTED NOT DETECTED Final   Candida auris NOT DETECTED NOT DETECTED Final   Candida glabrata NOT DETECTED NOT DETECTED Final   Candida krusei NOT DETECTED NOT DETECTED Final   Candida parapsilosis NOT DETECTED NOT DETECTED Final   Candida tropicalis NOT DETECTED NOT DETECTED Final   Cryptococcus neoformans/gattii NOT DETECTED NOT DETECTED Final   CTX-M ESBL NOT DETECTED NOT DETECTED Final   Carbapenem resistance IMP NOT DETECTED NOT DETECTED Final   Carbapenem resistance KPC NOT DETECTED NOT DETECTED Final   Carbapenem resistance NDM NOT DETECTED NOT DETECTED Final   Carbapenem resist OXA 48 LIKE NOT DETECTED NOT DETECTED Final   Carbapenem resistance VIM NOT DETECTED NOT DETECTED Final    Comment: Performed at  [facility], [place] [address], [place], [place] [address]  Resp Panel by RT-PCR (Flu A&B, Covid) Nasopharyngeal Swab     Status: None   Collection Time: 12/09/20  9:56 PM   Specimen: Nasopharyngeal Swab; Nasopharyngeal(NP) swabs in vial transport medium  Result Value Ref Range  Status   SARS Coronavirus 2 by RT PCR NEGATIVE NEGATIVE Final    Comment: (NOTE) SARS-CoV-2 target nucleic acids are NOT DETECTED.  The SARS-CoV-2 RNA is generally detectable in upper respiratory specimens during the acute phase of infection. The lowest concentration of SARS-CoV-2 viral copies this assay can detect is 138 copies/mL. A negative result does not preclude SARS-Cov-2 infection and should not be used as the sole basis for treatment or other patient management decisions. A negative result may occur with  improper specimen collection/handling, submission of specimen other than nasopharyngeal swab, presence of viral mutation(s) within the areas targeted by this assay, and inadequate number of viral copies(<138 copies/mL). A negative result must be combined with clinical observations, patient history, and epidemiological information. The expected result is Negative.  Fact Sheet for Patients:  EntrepreneurPulse.com.au  Fact Sheet for Healthcare Providers:  IncredibleEmployment.be  This test is no t yet approved or cleared by the [place] FDA and  has been authorized for detection and/or diagnosis of SARS-CoV-2 by FDA under an Emergency Use Authorization (EUA). This EUA will remain  in effect (meaning this test can be used) for the duration of the COVID-19 declaration under Section 564(b)(1) of the Act, 21 U.S.C.section 360bbb-3(b)(1), unless the authorization is terminated  or revoked sooner.       Influenza A by PCR NEGATIVE NEGATIVE Final   Influenza B by PCR NEGATIVE NEGATIVE Final    Comment: (NOTE) The Xpert Xpress SARS-CoV-2/FLU/RSV plus assay is intended as an aid in the diagnosis of influenza from Nasopharyngeal swab specimens and should not be used as a sole basis for treatment. Nasal washings and aspirates are unacceptable for Xpert Xpress SARS-CoV-2/FLU/RSV testing.  Fact Sheet for  Patients: EntrepreneurPulse.com.au  Fact Sheet for Healthcare Providers: IncredibleEmployment.be  This test is not yet approved or cleared by the [place] FDA and has been authorized for detection and/or diagnosis of SARS-CoV-2 by FDA under an Emergency Use Authorization (EUA). This EUA will remain in effect (meaning this test can be used) for the duration of the COVID-19 declaration under Section 564(b)(1) of the Act, 21 U.S.C. section 360bbb-3(b)(1), unless the authorization is terminated or revoked.  Performed at [facility], [place] [address] [place], [place] [address]   MRSA PCR Screening     Status: None   Collection Time: 12/10/20  1:11 AM   Specimen: Nasopharyngeal  Result Value Ref Range Status   MRSA by PCR NEGATIVE NEGATIVE Final    Comment:        The GeneXpert MRSA Assay (FDA approved for NASAL specimens only), is one component of a comprehensive MRSA colonization surveillance program. It is not intended to diagnose MRSA infection nor to guide or monitor treatment for MRSA infections. Performed at [facility], [place] [address], [place], [place] [address]   Culture, blood (routine x 2)     Status: None (Preliminary result)   Collection Time: 12/11/20  8:56 AM   Specimen: BLOOD LEFT HAND  Result Value Ref Range Status   Specimen Description BLOOD LEFT HAND  Final   Special Requests   Final    BOTTLES DRAWN AEROBIC ONLY Blood Culture results may not be optimal  due to an inadequate volume of blood received in culture bottles   Culture   Final    NO GROWTH 3 DAYS Performed at [facility], [place] [address], [place], [place] [address]    Report Status PENDING  Incomplete  Culture, blood (routine x 2)     Status: None (Preliminary result)   Collection Time: 12/11/20  9:03 AM   Specimen: BLOOD RIGHT HAND  Result Value Ref Range Status   Specimen Description BLOOD RIGHT HAND  Final   Special Requests   Final     BOTTLES DRAWN AEROBIC AND ANAEROBIC Blood Culture results may not be optimal due to an inadequate volume of blood received in culture bottles   Culture   Final    NO GROWTH 3 DAYS Performed at [facility], [place] [address] [place], [place] [address]    Report Status PENDING  Incomplete     Scheduled Meds: . atorvastatin  20 mg Oral Daily  . Chlorhexidine Gluconate Cloth  6 each Topical Daily  . feeding supplement  1 Container Oral TID BM  . insulin aspart  0-6 Units Subcutaneous Q4H  . mouth rinse  15 mL Mouth Rinse BID  . potassium chloride  40 mEq Oral BID  . sertraline  50 mg Oral Daily  . sodium chloride flush  10-40 mL Intracatheter Q12H   Continuous Infusions: . cefTRIAXone (ROCEPHIN)  IV 2 g (12/13/20 1744)  . dextrose Stopped (12/12/20 1725)  . sodium phosphate  Dextrose 5% IVPB       LOS: 5 days   Cherene Altes, MD [facility] Office  [phone] Pager - Text Page per Amion  If 7PM-7AM, please contact night-coverage per Amion 12/14/2020, 10:05 AM

## 2020-12-14 NOTE — Progress Notes (Signed)
Rehab Admissions Coordinator Note:  Patient was screened by Cleatrice Burke for appropriateness for an Inpatient Acute Rehab Consult per therapy recs. .  At this time, we are recommending Inpatient Rehab consult. I will place order per protocol.  Cleatrice Burke RN MSN 12/14/2020, 11:02 AM  I can be reached at [phone].

## 2020-12-15 ENCOUNTER — Ambulatory Visit: Payer: Managed Care, Other (non HMO)

## 2020-12-15 DIAGNOSIS — N12 Tubulo-interstitial nephritis, not specified as acute or chronic: Secondary | ICD-10-CM | POA: Diagnosis not present

## 2020-12-15 DIAGNOSIS — N201 Calculus of ureter: Secondary | ICD-10-CM | POA: Diagnosis not present

## 2020-12-15 DIAGNOSIS — A419 Sepsis, unspecified organism: Secondary | ICD-10-CM | POA: Diagnosis not present

## 2020-12-15 DIAGNOSIS — R6521 Severe sepsis with septic shock: Secondary | ICD-10-CM | POA: Diagnosis not present

## 2020-12-15 LAB — CBC
HCT: 33.5 % — ABNORMAL LOW (ref 36.0–46.0)
Hemoglobin: 11.5 g/dL — ABNORMAL LOW (ref 12.0–15.0)
MCH: 29.3 pg (ref 26.0–34.0)
MCHC: 34.3 g/dL (ref 30.0–36.0)
MCV: 85.2 fL (ref 80.0–100.0)
Platelets: 53 10*3/uL — ABNORMAL LOW (ref 150–400)
RBC: 3.93 MIL/uL (ref 3.87–5.11)
RDW: 14.4 % (ref 11.5–15.5)
WBC: 19.6 10*3/uL — ABNORMAL HIGH (ref 4.0–10.5)
nRBC: 0 % (ref 0.0–0.2)

## 2020-12-15 LAB — COMPREHENSIVE METABOLIC PANEL
ALT: 87 U/L — ABNORMAL HIGH (ref 0–44)
AST: 58 U/L — ABNORMAL HIGH (ref 15–41)
Albumin: 1.8 g/dL — ABNORMAL LOW (ref 3.5–5.0)
Alkaline Phosphatase: 188 U/L — ABNORMAL HIGH (ref 38–126)
Anion gap: 8 (ref 5–15)
BUN: 47 mg/dL — ABNORMAL HIGH (ref 6–20)
CO2: 18 mmol/L — ABNORMAL LOW (ref 22–32)
Calcium: 8.5 mg/dL — ABNORMAL LOW (ref 8.9–10.3)
Chloride: 108 mmol/L (ref 98–111)
Creatinine, Ser: 1.48 mg/dL — ABNORMAL HIGH (ref 0.44–1.00)
GFR, Estimated: 42 mL/min — ABNORMAL LOW (ref >60)
Glucose, Bld: 158 mg/dL — ABNORMAL HIGH (ref 70–99)
Potassium: 4.6 mmol/L (ref 3.5–5.1)
Sodium: 134 mmol/L — ABNORMAL LOW (ref 135–145)
Total Bilirubin: 0.7 mg/dL (ref 0.3–1.2)
Total Protein: 5.4 g/dL — ABNORMAL LOW (ref 6.5–8.1)

## 2020-12-15 LAB — MAGNESIUM: Magnesium: 1.7 mg/dL (ref 1.7–2.4)

## 2020-12-15 LAB — GLUCOSE, CAPILLARY
Glucose-Capillary: 125 mg/dL — ABNORMAL HIGH (ref 70–99)
Glucose-Capillary: 245 mg/dL — ABNORMAL HIGH (ref 70–99)
Glucose-Capillary: 179 mg/dL — ABNORMAL HIGH (ref 70–99)
Glucose-Capillary: 186 mg/dL — ABNORMAL HIGH (ref 70–99)

## 2020-12-15 LAB — PHOSPHORUS: Phosphorus: 2.3 mg/dL — ABNORMAL LOW (ref 2.5–4.6)

## 2020-12-15 MED ORDER — ENSURE ENLIVE PO LIQD
237.0000 mL | Freq: Three times a day (TID) | ORAL | Status: DC
  Administered 2020-12-15 – 2020-12-16 (×2): 237 mL via ORAL

## 2020-12-15 MED ORDER — QUETIAPINE FUMARATE 50 MG PO TABS
50.0000 mg | ORAL_TABLET | Freq: Two times a day (BID) | ORAL | Status: DC
  Administered 2020-12-15 – 2020-12-16 (×3): 50 mg via ORAL
  Filled 2020-12-15 (×3): qty 1

## 2020-12-15 NOTE — Progress Notes (Signed)
Physical Therapy Treatment Patient Details Name: Melanie Madden MRN: [id number] DOB: 03/13/1966 Today's Date: 12/15/2020    History of Present Illness Patient is 55 year old female  who was brought from home to the ER with syncopal episode, pt found to have septic shock from  infection emphysematous pyelonephritis with infected kidney stones. Pt taken to IR for percutaneous nephrostomy tube.  PMH: stroke with right-sided hemiplegia and global aphasia, hypertension    PT Comments    Pt up in chair on arrival to the room. Pt performed sit<>stand for neuro re-ed. Multimodal cues for upright posture and to encourage weight shift to the right. Pt able to communicate needs through gestures and seems very aware of her needs (gesturing for LEs to be propped, gesturing to AFO). Continues to be an excellent candidate for CIR. Will continue to follow acutely.    Follow Up Recommendations  CIR     Equipment Recommendations  None recommended by PT    Recommendations for Other Services       Precautions / Restrictions Precautions Precautions: Fall Precaution Comments: global aphasic, residual R hemiplegia Required Braces or Orthoses:  (AFO in room, likely needed for walking.)    Mobility  Bed Mobility Overal bed mobility: Needs Assistance             General bed mobility comments: up in chair on arrival to room    Transfers Overall transfer level: Needs assistance Equipment used: 2 person hand held assist Transfers: Sit to/from Stand Sit to Stand: +2 physical assistance;+2 safety/equipment;Mod assist         General transfer comment: Sit<>stand performed 6x. Multimodal cues to encourage weight shift to the R LE and to correct flexed posture.  Ambulation/Gait             General Gait Details: unable this date   Stairs             Wheelchair Mobility    Modified Rankin (Stroke Patients Only)       Balance Overall balance assessment: Needs  assistance Sitting-balance support: Feet supported Sitting balance-Leahy Scale: Fair     Standing balance support: Bilateral upper extremity supported Standing balance-Leahy Scale: Poor Standing balance comment: dependent on physical assist                            Cognition Arousal/Alertness: Awake/alert Behavior During Therapy: WFL for tasks assessed/performed Overall Cognitive Status: History of cognitive impairments - at baseline                                 General Comments: pt globally aphasic, had difficulty answering questions via head nodding when asked orientation questions, pt did follow all commands provide great effort. Able to point and gesture to communicate some needs.      Exercises      General Comments        Pertinent Vitals/Pain Pain Assessment: No/denies pain    Home Living                      Prior Function            PT Goals (current goals can now be found in the care plan section) Acute Rehab PT Goals Patient Stated Goal: didn't state PT Goal Formulation: With patient Time For Goal Achievement: 12/28/20 Potential to Achieve Goals: Good Progress towards PT goals: Progressing  toward goals    Frequency    Min 3X/week      PT Plan Current plan remains appropriate    Co-evaluation              AM-PAC PT "6 Clicks" Mobility   Outcome Measure  Help needed turning from your back to your side while in a flat bed without using bedrails?: A Lot Help needed moving from lying on your back to sitting on the side of a flat bed without using bedrails?: A Lot Help needed moving to and from a bed to a chair (including a wheelchair)?: A Lot Help needed standing up from a chair using your arms (e.g., wheelchair or bedside chair)?: A Lot Help needed to walk in hospital room?: A Lot Help needed climbing 3-5 steps with a railing? : Total 6 Click Score: 11    End of Session Equipment Utilized During  Treatment: Gait belt Activity Tolerance: Patient tolerated treatment well Patient left: in chair;with call bell/phone within reach Nurse Communication: Mobility status PT Visit Diagnosis: Unsteadiness on feet (R26.81);Muscle weakness (generalized) (M62.81);Difficulty in walking, not elsewhere classified (R26.2)     Time: 2947-6546 PT Time Calculation (min) (ACUTE ONLY): 19 min  Charges:  $Therapeutic Activity: 8-22 mins           Benjiman Core, [place] Pager [phone] [place] 12/15/2020, 2:38 PM

## 2020-12-15 NOTE — Progress Notes (Signed)
Referring Physician(s): [place]  Supervising Physician: Corrie Mckusick  Patient Status:  [facility] - In-pt  Chief Complaint: Right nephrolithiasis, recent urosepsis   Subjective: Patient sitting up next to chair, currently getting cleaned by nursing staff; no acute changes   Allergies: Amoxicillin and Shellfish allergy  Medications: Prior to Admission medications   Medication Sig Start Date End Date Taking? Authorizing Provider  amLODipine (NORVASC) 10 MG tablet Take 10 mg by mouth daily.   Yes [provider]  atorvastatin (LIPITOR) 20 MG tablet Take 1 tablet (20 mg total) by mouth daily at 6 PM. 08/20/19  Yes Angiulli, Lavon Paganini, PA-C  B Complex-C (B-COMPLEX WITH VITAMIN C) tablet Take 1 tablet by mouth daily.   Yes [provider]  CALCIUM PO Take 1 tablet by mouth daily.   Yes [provider]  cholecalciferol (VITAMIN D) 1000 units tablet Take 1,000 Units by mouth daily. 2,000 units daily   Yes [provider]  docusate sodium (COLACE) 100 MG capsule Take 300 mg by mouth daily.   Yes [provider]  magnesium 30 MG tablet Take 30 mg by mouth daily.   Yes [provider]  Omega-3 Fatty Acids (FISH OIL) 1200 MG CPDR Take 1 capsule by mouth daily.   Yes [provider]  QUEtiapine (SEROQUEL) 50 MG tablet Take 1 tablet (50 mg total) by mouth 2 (two) times daily. 11/10/20  Yes Kirsteins, [name], MD  sertraline (ZOLOFT) 50 MG tablet Take 1 tablet (50 mg total) by mouth daily. 11/10/20  Yes Kirsteins, [name], MD  VITAMIN E PO Take 1 tablet by mouth daily.    Yes [provider]  zinc sulfate 220 (50 Zn) MG capsule Take 220 mg by mouth daily.   Yes [provider]     Vital Signs: BP 128/76 (BP Location: Right Arm)   Pulse 89   Temp 98.7 F (37.1 C) (Oral)   Resp 18   Ht 5\' 7"  (1.702 m)   Wt 165 lb 5.5 oz (75 kg)   SpO2 96%   BMI 25.90 kg/m   Physical Exam awake; aphasic;  Right nephrostomy  intact, insertion site with minimal erythema, output 200 cc yellow urine; catheter flushes okay  Imaging: No results found.  Labs:  CBC: Recent Labs    12/12/20 0358 12/13/20 0305 12/14/20 0229 12/15/20 0325  WBC 13.6* 15.1* 15.6* 19.6*  HGB 11.7* 11.8* 11.8* 11.5*  HCT 35.0* 33.9* 34.4* 33.5*  PLT 25* 23* 30* 53*    COAGS: Recent Labs    12/10/20 0559  INR 1.8*    BMP: Recent Labs    12/19/19 2057 12/09/20 1748 12/12/20 0358 12/12/20 1000 12/13/20 0305 12/14/20 0229 12/15/20 0325  NA 140   < > 135  --  137 136 134*  K 4.1   < > 2.9* 4.2 3.2* 3.8 4.6  CL 103   < > 108  --  113* 112* 108  CO2 28   < > 17*  --  18* 18* 18*  GLUCOSE 116*   < > 112*  --  122* 123* 158*  BUN 18   < > 52*  --  46* 45* 47*  CALCIUM 10.1   < > 8.5*  --  8.7* 8.7* 8.5*  CREATININE 0.85   < > 2.54*  --  2.24* 1.69* 1.48*  GFRNONAA >60   < > 22*  --  25* 35* 42*  GFRAA >60  --   --   --   --   --   --    < > =  values in this interval not displayed.    LIVER FUNCTION TESTS: Recent Labs    12/19/19 2057 12/09/20 1748 12/11/20 1548 12/15/20 0325  BILITOT 0.5 1.1 0.8 0.7  AST 21 38 28 58*  ALT 32 35 44 87*  ALKPHOS 58 128* 97 188*  PROT 6.6 5.4* 5.0* 5.4*  ALBUMIN 3.6 2.7* 1.9* 1.8*    Assessment and Plan: 55 year old female admitted due to urosepsis, s/p right PCN catheter placement  on 12/09/2020 due to emphysematous pyelitis, mild right hydronephrosis with obstructing nephrolithiasis;  afebrile, WBC 19.6 up from 15.6, hemoglobin 11.5 and stable, creatinine 1.48 down from 1.69, latest blood cultures negative to date; send nephrostomy urine for culture; other plans as per [facility]/urology   Electronically Signed: D. Rowe Robert, PA-C 12/15/2020, 12:45 PM   I spent a total of 15 minutes at the the patient's bedside AND on the patient's hospital floor or unit, greater than 50% of which was counseling/coordinating care for right nephrostomy    Patient ID: Melanie Madden, female   DOB: 02/02/1966, 55 y.o.   MRN: [id number]

## 2020-12-15 NOTE — PMR Pre-admission (Signed)
PMR Admission Coordinator Pre-Admission Assessment  Patient: Melanie Madden Melanie Madden is an 55 y.o., female MRN: [id number] DOB: May 20, 1966 Height: $RemoveBefo'5\' 7"'zlZoZNfPNhq$  (170.2 cm) Weight: 73 kg  Insurance Information HMO: Yes    PPO:       PCP:       IPA:       80/20:       OTHER:  Open access plus PRIMARY: [place]      Policy#: [id number]      Subscriber: patient CM Name: Melanie Madden      Phone#: [phone] X [phone]     Fax#: [phone] Pre-Cert#: [id number] from 5/20 to 12/22/20 with update due on 12/23/20 to Hinton Dyer While at fax [phone] and phone [phone] ext [phone]     Employer:  Not working Benefits:  Phone #: [phone]     Name: Online [email or web address] Eff. Date: 07/30/20     Deduct: $500 (met $245.29)      Out of Pocket Max: $4000 (met $620.29)      Life Max: N/A CIR: 100%      SNF: 100% Outpatient:       Co-Pay: $50 copay per visit Home Health: 100%      Co-Pay: none DME: 100%     Co-Pay: none Providers: in network  SECONDARY:        Policy#:       Phone#:    Development worker, community:        Phone#:    [employer], [employer]" for patients in Inpatient Rehabilitation Facilities with attached "Privacy Act [place] Records" was provided and verbally reviewed with: N/A  Emergency Contact Information Contact Information    Name Relation Home Work Mobile   Melanie Madden  [phone]   Melanie Madden Sister   [phone]      Current Medical History  Patient Admitting Diagnosis: Debility post septic shock and hydronephrosis  History of Present Illness: a 55 y.o. female with a history of kidney stones chick required ESWL and ureteroscopy in the past, CVA with right hemiparesis and aphasia.  She presented to the [facility] emergency department on 12/09/2020 with altered mental status following a syncopal episode.  CT revealed an obstructing 8 mm right proximal ureteral calculus with mild to moderate right-sided  hydronephrosis and signs of emphysematous pyelitis.  Blood pressure in the ER was 60 over palp she was found to be profoundly tachycardic.  Interventional radiology was consulted and a right-sided nephrostomy tube was placed.    Patient's medical record from [facility] has been reviewed by the rehabilitation admission coordinator and physician.  Past Medical History  Past Medical History:  Diagnosis Date  . Allergy   . Anxiety   . Arthritis   . High triglycerides   . Kidney stones   . Migraines   . Recurrent sinus infections     Family History   family history includes Alzheimer's disease in her mother; Deep vein thrombosis in her maternal grandmother; Diabetes in her mother; Hypertension in her mother; Thyroid disease in her mother.  Prior Rehab/Hospitalizations Has the patient had prior rehab or hospitalizations prior to admission? Yes  Has the patient had major surgery during 100 days prior to admission? yes   Current Medications  Current Facility-Administered Medications:  .  acetaminophen (TYLENOL) tablet 650 mg, 650 mg, Oral, Q6H PRN, Freddi Starr, MD, 650 mg at 12/12/20 2043 .  atorvastatin (LIPITOR) tablet 20 mg, 20 mg, Oral, Daily, Melanie Jackson B, MD, 20 mg at 12/15/20  0942 .  cefTRIAXone (ROCEPHIN) 2 g in sodium chloride 0.9 % 100 mL IVPB, 2 g, Intravenous, Q24H, Dewald, Jonathan B, MD, Last Rate: 200 mL/hr at 12/15/20 1705, 2 g at 12/15/20 1705 .  Chlorhexidine Gluconate Cloth 2 % PADS 6 each, 6 each, Topical, Daily, Freddi Starr, MD, 6 each at 12/16/20 0910 .  docusate sodium (COLACE) capsule 100 mg, 100 mg, Oral, BID PRN, Melanie Dragon, MD .  feeding supplement (ENSURE ENLIVE / ENSURE PLUS) liquid 237 mL, 237 mL, Oral, TID BM, Melanie Altes, MD, 237 mL at 12/15/20 1312 .  hydroxypropyl methylcellulose / hypromellose (ISOPTO TEARS / GONIOVISC) 2.5 % ophthalmic solution 1 drop, 1 drop, Both Eyes, TID PRN, Melanie Merino, MD, 1 drop at 12/15/20  1757 .  insulin aspart (novoLOG) injection 0-5 Units, 0-5 Units, Subcutaneous, QHS, Melanie Altes, MD, 2 Units at 12/14/20 2202 .  insulin aspart (novoLOG) injection 0-9 Units, 0-9 Units, Subcutaneous, TID WC, Melanie Altes, MD, 2 Units at 12/15/20 1803 .  MEDLINE mouth rinse, 15 mL, Mouth Rinse, BID, Melanie Dragon, MD, 15 mL at 12/15/20 2051 .  polyethylene glycol (MIRALAX / GLYCOLAX) packet 17 g, 17 g, Oral, Daily PRN, Melanie Dragon, MD .  QUEtiapine (SEROQUEL) tablet 50 mg, 50 mg, Oral, BID, Melanie Merino, MD, 50 mg at 12/15/20 2126 .  sertraline (ZOLOFT) tablet 50 mg, 50 mg, Oral, Daily, Freddi Starr, MD, 50 mg at 12/15/20 0942 .  sodium chloride flush (NS) 0.9 % injection 10-40 mL, 10-40 mL, Intracatheter, Q12H, Melanie Dragon, MD, 10 mL at 12/15/20 2051 .  sodium chloride flush (NS) 0.9 % injection 10-40 mL, 10-40 mL, Intracatheter, PRN, Melanie Dragon, MD, 10 mL at 12/14/20 2204  Patients Current Diet:  Diet Order            Diet regular Room service appropriate? Yes; Fluid consistency: Thin  Diet effective now                Precautions / Restrictions Precautions Precautions: Fall Precaution Comments: global aphasic, residual R hemiplegia Restrictions Weight Bearing Restrictions: No   Has the patient had 2 or more falls or a fall with injury in the past year? No  Prior Activity Level Limited Community (1-2x/wk): Went out about 3 times a week with caregiver or husband.  Prior Functional Level Self Care: Did the patient need help bathing, dressing, using the toilet or eating? Needed some help  Indoor Mobility: Did the patient need assistance with walking from room to room (with or without device)? Needed some help  Stairs: Did the patient need assistance with internal or external stairs (with or without device)? Needed some help  Functional Cognition: Did the patient need help planning regular tasks such as shopping or remembering to take  medications? Needed some help  Home Assistive Devices / Equipment Home Equipment: Tub bench,Wheelchair - manual,Cane - quad,Grab bars - toilet,Grab bars - tub/shower,Bedside commode  Prior Device Use: Indicate devices/aids used by the patient prior to current illness, exacerbation or injury? quad cane  Current Functional Level Cognition  Overall Cognitive Status: History of cognitive impairments - at baseline Orientation Level: Other (comment) (UTA) General Comments: pt globally aphasic, had difficulty answering questions via head nodding when asked orientation questions, pt did follow all commands provide great effort. Able to point and gesture to communicate some needs.    Extremity Assessment (includes Sensation/Coordination)  Upper Extremity Assessment: RUE deficits/detail RUE Deficits / Details: hemiplegia, no active  movement RUE Sensation: decreased light touch RUE Coordination: decreased fine motor,decreased gross motor  Lower Extremity Assessment: Defer to PT evaluation RLE Deficits / Details: residual hemiplegia, trace voluntary movement    ADLs  Overall ADL's : Needs assistance/impaired Eating/Feeding: Set up,Sitting Eating/Feeding Details (indicate cue type and reason): able to bring cup to mouth to drin Grooming: Wash/dry face,Wash/dry hands,Set up,Sitting Grooming Details (indicate cue type and reason): completed sitting in recliner Upper Body Bathing: Moderate assistance,Sitting Upper Body Bathing Details (indicate cue type and reason): Pt needs assist due to RUE flaccidity Lower Body Bathing: Maximal assistance,Sitting/lateral leans,Sit to/from stand Lower Body Bathing Details (indicate cue type and reason): Pt unable to complete LB bathing with out max assit for pericare and reaching both feet Upper Body Dressing : Minimal assistance,Sitting Upper Body Dressing Details (indicate cue type and reason): Min assist due to RUE hemiplegia Lower Body Dressing: Maximal  assistance,Sitting/lateral leans Lower Body Dressing Details (indicate cue type and reason): Pt requires max assist to bring feet into figure 4, then has difficulty maintaining posture to complete dressing. Toilet Transfer: Maximal assistance,+2 for physical assistance,+2 for safety/equipment,Stand-pivot,BSC,RW Toilet Transfer Details (indicate cue type and reason): Pt requires max +2 to stand and due to RLE hemiplegia, pt has difficulty pivoting once in standing Toileting- Clothing Manipulation and Hygiene: Total assistance,+2 for physical assistance,Sitting/lateral lean,Sit to/from stand Toileting - Clothing Manipulation Details (indicate cue type and reason): Pt requires total assist due to balance and RUE hemiplegia Tub/ Shower Transfer: Maximal assistance,+2 for physical assistance,+2 for safety/equipment,Stand-pivot,Tub bench Tub/Shower Transfer Details (indicate cue type and reason): Pt requires max assist +2 to stand and unable to pivot without max A +2 due to RLE hemiplegia Functional mobility during ADLs: Maximal assistance,+2 for physical assistance,+2 for safety/equipment,Rolling walker General ADL Comments: Pt requires max +2 for standing and attempting to take a step. Pt able to wiggle RLE in standing, but having difficulties taking steps at this time. Pt requiring min-max assist for all dressing and bathing, and setup to min assist for grooming.    Mobility  Overal bed mobility: Needs Assistance Bed Mobility: Supine to Sit Supine to sit: Min assist,Mod assist,+2 for physical assistance,HOB elevated General bed mobility comments: up in chair on arrival to room    Transfers  Overall transfer level: Needs assistance Equipment used: 2 person hand held assist Transfers: Sit to/from Stand Sit to Stand: +2 physical assistance,+2 safety/equipment,Mod assist Stand pivot transfers: Mod assist,+2 physical assistance General transfer comment: Sit<>stand performed 6x. Multimodal cues to  encourage weight shift to the R LE and to correct flexed posture.    Ambulation / Gait / Stairs / Wheelchair Mobility  Ambulation/Gait General Gait Details: unable this date    Posture / Balance Dynamic Sitting Balance Sitting balance - Comments: pt propped self up with L UE Balance Overall balance assessment: Needs assistance Sitting-balance support: Feet supported Sitting balance-Leahy Scale: Fair Sitting balance - Comments: pt propped self up with L UE Standing balance support: Bilateral upper extremity supported Standing balance-Leahy Scale: Poor Standing balance comment: dependent on physical assist    Special needs/care consideration Skin Nephrostomy tube in place   Previous Home Environment  Living Arrangements: Madden/significant other Available Help at Discharge: Family,Available 24 hours/day,Personal care attendant Type of Home: House Home Layout: Able to live on main level with bedroom/bathroom,Two level Alternate Level Stairs-Rails:  (uses chair lift) Alternate Level Stairs-Number of Steps: flight Home Access: Stairs to enter Entrance Stairs-Rails: None Entrance Stairs-Number of Steps: 2-3 Bathroom Shower/Tub: Chiropodist:  Standard Bathroom Accessibility: Yes  Discharge Living Setting Plans for Discharge Living Setting: Patient's home,House,Lives with (comment) (Lives with husband.) Type of Home at Discharge: House Discharge Home Layout: Two level,1/2 bath on main level,Bed/bath upstairs Alternate Level Stairs-Number of Steps: Stair lift to second floor of home with bedrooms and full bathrooms Discharge Home Access: Ramped entrance (At garage entry ramp if needed) Discharge Bathroom Shower/Tub: Tub/shower unit,Curtain Discharge Bathroom Toilet: Handicapped height (using 3 in 1 over the toilet) Discharge Bathroom Accessibility: Yes How Accessible: Accessible via walker  Social/Family/Support Systems Patient Roles: Madden,Other (Comment) (Has  husband and caregiver.) Contact Information: [name] - Madden - (c) [phone] Anticipated Caregiver: Husband and hired caregiver Ability/Limitations of Caregiver: Caregiver works 7:30 am to 4:30 pm daily while husband is a work. Caregiver Availability: 24/7 Discharge Plan Discussed with Primary Caregiver: Yes Is Caregiver In Agreement with Plan?: Yes Does Caregiver/Family have Issues with Lodging/Transportation while Pt is in Rehab?: No   Caregiver comes in 0730 5 days per week while Madden works for 9 hours  Goals Patient/Family Goal for Rehab: PT/OT min assist to supervision goals, SLP min Assist goals Expected length of stay: 12-14 days Cultural Considerations: None Additional Information: Was on CIR in 2020 after CVA.  Has all equipment at home per husband.  Was going to outpatient therapy at [address] PTA.  Has aphasia with R arm weakness. Pt/Family Agrees to Admission and willing to participate: Yes Program Orientation Provided & Reviewed with Pt/Caregiver Including Roles  & Responsibilities: Yes  Decrease burden of Care through IP rehab admission: N/A  Possible need for SNF placement upon discharge: Not anticipated  Patient Condition: I have reviewed medical records from [facility], spoken with CM, and patient and Madden. I met with patient at the bedside and discussed via phone for inpatient rehabilitation assessment.  Patient will benefit from ongoing PT, OT and SLP, can actively participate in 3 hours of therapy a day 5 days of the week, and can make measurable gains during the admission.  Patient will also benefit from the coordinated team approach during an Inpatient Acute Rehabilitation admission.  The patient will receive intensive therapy as well as Rehabilitation physician, nursing, social worker, and care management interventions.  Due to bladder management, bowel management, safety, skin/wound care, disease management, medication administration, pain  management and patient education the patient requires 24 hour a day rehabilitation nursing.  The patient is currently mod assist to max assist with mobility and basic ADLs.  Discharge setting and therapy post discharge at home with outpatient is anticipated.  Patient has agreed to participate in the Acute Inpatient Rehabilitation Program and will admit today.  Preadmission Screen Completed By: Karene Fry with updates by  Cleatrice Burke, 12/16/2020 10:48 AM ______________________________________________________________________   Discussed status with Dr. Posey Pronto  on  12/16/2020 at  1052 and received approval for admission today.  Admission Coordinator: Karene Fry with updates by Cleatrice Burke, RN, time  40 Date  12/16/2020   Assessment/Plan: Diagnosis: Debility   1. Does the need for close, 24 hr/day Medical supervision in concert with the patient's rehab needs make it unreasonable for this patient to be served in a less intensive setting? Yes  2. Co-Morbidities requiring supervision/potential complications: kidney stones, CVA with right hemiparesis and aphasia, leukocytosis (repeat labs, cont to monitor for signs and symptoms of infection, further workup if indicated) 3. Due to safety, skin/wound care, disease management, pain management and patient education, does the patient require 24 hr/day rehab nursing?  Yes 4. Does the patient require coordinated care of a physician, rehab nurse, PT, OT, and SLP to address physical and functional deficits in the context of the above medical diagnosis(es)? Yes Addressing deficits in the following areas: balance, endurance, locomotion, strength, transferring, bowel/bladder control, toileting, cognition and psychosocial support 5. Can the patient actively participate in an intensive therapy program of at least 3 hrs of therapy 5 days a week? Yes 6. The potential for patient to make measurable gains while on inpatient rehab is  excellent 7. Anticipated functional outcomes upon discharge from inpatient rehab: supervision and min assist PT, supervision and min assist OT, supervision and min assist SLP 8. Estimated rehab length of stay to reach the above functional goals is: 14-17 days. 9. Anticipated discharge destination: Home 10. Overall Rehab/Functional Prognosis: good   MD Signature: Delice Lesch, MD, ABPMR

## 2020-12-15 NOTE — Plan of Care (Signed)

## 2020-12-15 NOTE — Progress Notes (Signed)
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  In the ER she was found to be hypotensive and did not respond to a 3 L fluid bolus.  Levophed was initiated.  CT abdomen and pelvis noted emphysematous pyelitis and obstructive hydronephrosis.  Patient required emergent percutaneous nephrostomy tube by IR and was admitted to the ICU.  Consultants:  Urology PCCM IR  Code Status: FULL CODE  Antimicrobials:  Cefepime 5/13 Flagyl 5/13 Vanc 5/13 Zosyn 5/14 Ceftriaxone 5/14 >  DVT prophylaxis: SCDs  Subjective: Afebrile.  Vital signs stable.  CBG well controlled.  Electrolytes stable.  Alert with no complaints today.  Assessment & Plan:  Septic shock POA - E. coli bacteremia - emphysematous pyelonephritis with infected kidney stones Pan sensitive E. coli noted on blood culture -continue ceftriaxone until ready for discharge then transition to oral treatment to complete 14 total days - clinically much improved/stable  Right obstructing kidney stone -obstructive uropathy with acute kidney failure Right percutaneous nephrostomy to remain in place -outpatient follow-up for cystoscopy ureteroscopy and stone removal -renal function improving each day  Hypokalemia Corrected with supplementation  Hypophosphatemia Improved with supplementation -monitor intermittently  Thrombocytopenia Due to gram-negative rod sepsis -improving  Hypoglycemia Due to limited/poor intake -appears to have stabilized for now  Prior stroke with right hemiplegia Continue therapy   Family Communication:  Status is: Inpatient  Remains inpatient appropriate because:Inpatient level of care appropriate due to severity of illness   Dispo: The  patient is from: Home              Anticipated d/c is to: CIR              Patient currently is medically stable to d/c.   Difficult to place patient No  Objective: Blood pressure 128/76, pulse 89, temperature 98.7 F (37.1 C), temperature source Oral, resp. rate 18, height 5\' 7"  (1.702 m), weight 75 kg, SpO2 96 %.  Intake/Output Summary (Last 24 hours) at 12/15/2020 0955 Last data filed at 12/15/2020 0914 Gross per 24 hour  Intake 847.33 ml  Output 3950 ml  Net -3102.67 ml   Filed Weights   12/12/20 0500 12/13/20 0126 12/14/20 0500  Weight: 71.8 kg 74.4 kg 75 kg    Examination: General: No acute respiratory distress Lungs: Clear to auscultation bilaterally without wheeze Cardiovascular: Regular rate and rhythm without murmur  Abdomen: Nontender, nondistended, soft, bowel sounds positive Extremities: No significant edema bilateral lower extremities  CBC: Recent Labs  Lab 12/09/20 1748 12/10/20 0559 12/11/20 0934 12/12/20 0358 12/13/20 0305 12/14/20 0229 12/15/20 0325  WBC 15.0*   < > 13.2*   < > 15.1* 15.6* 19.6*  NEUTROABS 13.8*  --  12.0*  --   --   --   --   HGB 12.7   < > 11.7*   < > 11.8* 11.8* 11.5*  HCT 38.7   < > 34.7*   < > 33.9* 34.4* 33.5*  MCV 92.4   < > 89.7   < > 85.6 85.8 85.2  PLT PLATELET CLUMPS NOTED ON SMEAR, UNABLE TO ESTIMATE   < > 26*   < > 23* 30* 53*   < > = values in this interval not displayed.   Basic  Metabolic Panel: Recent Labs  Lab 12/11/20 1548 12/12/20 0358 12/12/20 1000 12/13/20 0305 12/14/20 0229 12/15/20 0325  NA 135 135  --  137 136 134*  K 3.1* 2.9*   < > 3.2* 3.8 4.6  CL 106 108  --  113* 112* 108  CO2 16* 17*  --  18* 18* 18*  GLUCOSE 219* 112*  --  122* 123* 158*  BUN 59* 52*  --  46* 45* 47*  CREATININE 3.01* 2.54*  --  2.24* 1.69* 1.48*  CALCIUM 8.1* 8.5*  --  8.7* 8.7* 8.5*  MG 2.3 2.4  --   --  1.9 1.7  PHOS 3.9  --   --   --  2.0* 2.3*   < > = values in this interval not displayed.   GFR: Estimated  Creatinine Clearance: 45.4 mL/min (A) (by C-G formula based on SCr of 1.48 mg/dL (H)).  Liver Function Tests: Recent Labs  Lab 12/09/20 1748 12/11/20 1548 12/15/20 0325  AST 38 28 58*  ALT 35 44 87*  ALKPHOS 128* 97 188*  BILITOT 1.1 0.8 0.7  PROT 5.4* 5.0* 5.4*  ALBUMIN 2.7* 1.9* 1.8*    Coagulation Profile: Recent Labs  Lab 12/10/20 0559  INR 1.8*    HbA1C: Hgb A1c MFr Bld  Date/Time Value Ref Range Status  12/09/2020 01:11 AM 5.2 4.8 - 5.6 % Final    Comment:    (NOTE) Pre diabetes:          5.7%-6.4%  Diabetes:              >6.4%  Glycemic control for   <7.0% adults with diabetes   07/10/2019 11:45 AM 5.3 4.8 - 5.6 % Final    Comment:    (NOTE) Pre diabetes:          5.7%-6.4% Diabetes:              >6.4% Glycemic control for   <7.0% adults with diabetes     CBG: Recent Labs  Lab 12/14/20 0816 12/14/20 1204 12/14/20 1707 12/14/20 2119 12/15/20 0727  GLUCAP 112* 178* 263* 204* 125*    Recent Results (from the past 240 hour(s))  Culture, blood (routine x 2)     Status: None   Collection Time: 12/09/20  9:25 PM   Specimen: BLOOD  Result Value Ref Range Status   Specimen Description BLOOD SITE NOT SPECIFIED  Final   Special Requests   Final    BOTTLES DRAWN AEROBIC AND ANAEROBIC Blood Culture results may not be optimal due to an inadequate volume of blood received in culture bottles   Culture   Final    NO GROWTH 5 DAYS Performed at [facility], [place] [address] [place], [place] [address]    Report Status 12/14/2020 FINAL  Final  Culture, blood (routine x 2)     Status: Abnormal   Collection Time: 12/09/20  9:26 PM   Specimen: BLOOD  Result Value Ref Range Status   Specimen Description BLOOD SITE NOT SPECIFIED  Final   Special Requests   Final    BOTTLES DRAWN AEROBIC AND ANAEROBIC Blood Culture results may not be optimal due to an inadequate volume of blood received in culture bottles   Culture  Setup Time (A)  Final    GRAM  VARIABLE ROD IN BOTH AEROBIC AND ANAEROBIC BOTTLES CRITICAL RESULT CALLED TO, READ BACK BY AND VERIFIED WITH: Lyndel Safe [phone] FCP Performed at [facility],  [address] [place], [place] [address]    Culture ESCHERICHIA COLI (A)  Final   Report Status 12/12/2020 FINAL  Final   Organism ID, Bacteria ESCHERICHIA COLI  Final      Susceptibility   Escherichia coli - MIC*    AMPICILLIN <=2 SENSITIVE Sensitive     CEFAZOLIN <=4 SENSITIVE Sensitive     CEFEPIME <=0.12 SENSITIVE Sensitive     CEFTAZIDIME <=1 SENSITIVE Sensitive     CEFTRIAXONE <=0.25 SENSITIVE Sensitive     CIPROFLOXACIN <=0.25 SENSITIVE Sensitive     GENTAMICIN <=1 SENSITIVE Sensitive     IMIPENEM <=0.25 SENSITIVE Sensitive     TRIMETH/SULFA <=20 SENSITIVE Sensitive     AMPICILLIN/SULBACTAM <=2 SENSITIVE Sensitive     PIP/TAZO <=4 SENSITIVE Sensitive     * ESCHERICHIA COLI  Blood Culture ID Panel (Reflexed)     Status: Abnormal   Collection Time: 12/09/20  9:26 PM  Result Value Ref Range Status   Enterococcus faecalis NOT DETECTED NOT DETECTED Final   Enterococcus Faecium NOT DETECTED NOT DETECTED Final   Listeria monocytogenes NOT DETECTED NOT DETECTED Final   Staphylococcus species NOT DETECTED NOT DETECTED Final   Staphylococcus aureus (BCID) NOT DETECTED NOT DETECTED Final   Staphylococcus epidermidis NOT DETECTED NOT DETECTED Final   Staphylococcus lugdunensis NOT DETECTED NOT DETECTED Final   Streptococcus species NOT DETECTED NOT DETECTED Final   Streptococcus agalactiae NOT DETECTED NOT DETECTED Final   Streptococcus pneumoniae NOT DETECTED NOT DETECTED Final   Streptococcus pyogenes NOT DETECTED NOT DETECTED Final   A.calcoaceticus-baumannii NOT DETECTED NOT DETECTED Final   Bacteroides fragilis NOT DETECTED NOT DETECTED Final   Enterobacterales DETECTED (A) NOT DETECTED Final    Comment: Enterobacterales represent a large order of gram negative bacteria, not a single  organism. CRITICAL RESULT CALLED TO, READ BACK BY AND VERIFIED WITH: [place] M. [phone] FCP    Enterobacter cloacae complex NOT DETECTED NOT DETECTED Final   Escherichia coli DETECTED (A) NOT DETECTED Final    Comment: CRITICAL RESULT CALLED TO, READ BACK BY AND VERIFIED WITH: PHARMD JESSICA M. [phone] FCP    Klebsiella aerogenes NOT DETECTED NOT DETECTED Final   Klebsiella oxytoca NOT DETECTED NOT DETECTED Final   Klebsiella pneumoniae NOT DETECTED NOT DETECTED Final   Proteus species NOT DETECTED NOT DETECTED Final   Salmonella species NOT DETECTED NOT DETECTED Final   Serratia marcescens NOT DETECTED NOT DETECTED Final   Haemophilus influenzae NOT DETECTED NOT DETECTED Final   Neisseria meningitidis NOT DETECTED NOT DETECTED Final   Pseudomonas aeruginosa NOT DETECTED NOT DETECTED Final   Stenotrophomonas maltophilia NOT DETECTED NOT DETECTED Final   Candida albicans NOT DETECTED NOT DETECTED Final   Candida auris NOT DETECTED NOT DETECTED Final   Candida glabrata NOT DETECTED NOT DETECTED Final   Candida krusei NOT DETECTED NOT DETECTED Final   Candida parapsilosis NOT DETECTED NOT DETECTED Final   Candida tropicalis NOT DETECTED NOT DETECTED Final   Cryptococcus neoformans/gattii NOT DETECTED NOT DETECTED Final   CTX-M ESBL NOT DETECTED NOT DETECTED Final   Carbapenem resistance IMP NOT DETECTED NOT DETECTED Final   Carbapenem resistance KPC NOT DETECTED NOT DETECTED Final   Carbapenem resistance NDM NOT DETECTED NOT DETECTED Final   Carbapenem resist OXA 48 LIKE NOT DETECTED NOT DETECTED Final   Carbapenem resistance VIM NOT DETECTED NOT DETECTED Final    Comment: Performed at [facility], [address] [place],  [address]  Resp Panel by  RT-PCR (Flu A&B, Covid) Nasopharyngeal Swab     Status: None   Collection Time: 12/09/20  9:56 PM   Specimen: Nasopharyngeal Swab; Nasopharyngeal(NP) swabs in vial transport medium  Result Value Ref Range  Status   SARS Coronavirus 2 by RT PCR NEGATIVE NEGATIVE Final    Comment: (NOTE) SARS-CoV-2 target nucleic acids are NOT DETECTED.  The SARS-CoV-2 RNA is generally detectable in upper respiratory specimens during the acute phase of infection. The lowest concentration of SARS-CoV-2 viral copies this assay can detect is 138 copies/mL. A negative result does not preclude SARS-Cov-2 infection and should not be used as the sole basis for treatment or other patient management decisions. A negative result may occur with  improper specimen collection/handling, submission of specimen other than nasopharyngeal swab, presence of viral mutation(s) within the areas targeted by this assay, and inadequate number of viral copies(<138 copies/mL). A negative result must be combined with clinical observations, patient history, and epidemiological information. The expected result is Negative.  Fact Sheet for Patients:  EntrepreneurPulse.com.au  Fact Sheet for Healthcare Providers:  IncredibleEmployment.be  This test is no t yet approved or cleared by the [place] FDA and  has been authorized for detection and/or diagnosis of SARS-CoV-2 by FDA under an Emergency Use Authorization (EUA). This EUA will remain  in effect (meaning this test can be used) for the duration of the COVID-19 declaration under Section 564(b)(1) of the Act, 21 U.S.C.section 360bbb-3(b)(1), unless the authorization is terminated  or revoked sooner.       Influenza A by PCR NEGATIVE NEGATIVE Final   Influenza B by PCR NEGATIVE NEGATIVE Final    Comment: (NOTE) The Xpert Xpress SARS-CoV-2/FLU/RSV plus assay is intended as an aid in the diagnosis of influenza from Nasopharyngeal swab specimens and should not be used as a sole basis for treatment. Nasal washings and aspirates are unacceptable for Xpert Xpress SARS-CoV-2/FLU/RSV testing.  Fact Sheet for  Patients: EntrepreneurPulse.com.au  Fact Sheet for Healthcare Providers: IncredibleEmployment.be  This test is not yet approved or cleared by the [place] FDA and has been authorized for detection and/or diagnosis of SARS-CoV-2 by FDA under an Emergency Use Authorization (EUA). This EUA will remain in effect (meaning this test can be used) for the duration of the COVID-19 declaration under Section 564(b)(1) of the Act, 21 U.S.C. section 360bbb-3(b)(1), unless the authorization is terminated or revoked.  Performed at [facility], [place] [address] [place], [place] [address]   MRSA PCR Screening     Status: None   Collection Time: 12/10/20  1:11 AM   Specimen: Nasopharyngeal  Result Value Ref Range Status   MRSA by PCR NEGATIVE NEGATIVE Final    Comment:        The GeneXpert MRSA Assay (FDA approved for NASAL specimens only), is one component of a comprehensive MRSA colonization surveillance program. It is not intended to diagnose MRSA infection nor to guide or monitor treatment for MRSA infections. Performed at [facility], [place] [address] [place], [place] [address]   Culture, blood (routine x 2)     Status: None (Preliminary result)   Collection Time: 12/11/20  8:56 AM   Specimen: BLOOD LEFT HAND  Result Value Ref Range Status   Specimen Description BLOOD LEFT HAND  Final   Special Requests   Final    BOTTLES DRAWN AEROBIC ONLY Blood Culture results may not be optimal due to an inadequate volume of blood received in culture bottles   Culture  Final    NO GROWTH 3 DAYS Performed at [facility], [place] [address], [place], [place] [address]    Report Status PENDING  Incomplete  Culture, blood (routine x 2)     Status: None (Preliminary result)   Collection Time: 12/11/20  9:03 AM   Specimen: BLOOD RIGHT HAND  Result Value Ref Range Status   Specimen Description BLOOD RIGHT HAND  Final   Special Requests   Final     BOTTLES DRAWN AEROBIC AND ANAEROBIC Blood Culture results may not be optimal due to an inadequate volume of blood received in culture bottles   Culture   Final    NO GROWTH 3 DAYS Performed at [facility], [place] [address], [place], [place] [address]    Report Status PENDING  Incomplete     Scheduled Meds: . atorvastatin  20 mg Oral Daily  . Chlorhexidine Gluconate Cloth  6 each Topical Daily  . feeding supplement  1 Container Oral TID BM  . insulin aspart  0-5 Units Subcutaneous QHS  . insulin aspart  0-9 Units Subcutaneous TID WC  . mouth rinse  15 mL Mouth Rinse BID  . sertraline  50 mg Oral Daily  . sodium chloride flush  10-40 mL Intracatheter Q12H   Continuous Infusions: . cefTRIAXone (ROCEPHIN)  IV 2 g (12/14/20 1701)     LOS: 6 days   Cherene Altes, MD [facility] Office  [phone] Pager - Text Page per Amion  If 7PM-7AM, please contact night-coverage per Amion 12/15/2020, 9:55 AM

## 2020-12-15 NOTE — Progress Notes (Signed)
IP rehab admissions - I met with patient at the beside and gave her rehab booklets.  Patient is aphasic.  I called her husband.  Husband is agreeable to rehab prior to home if needed.  Patient was on CIR in 2020 after a CVA.  Patient has been going to outpatient therapy most recently.  I will begin insurance authorization for potential acute inpatient rehab admission with her [employer].  Call me for questions.  [phone]

## 2020-12-15 NOTE — Progress Notes (Signed)
Nutrition Follow-up  DOCUMENTATION CODES:   Not applicable  INTERVENTION:   Ensure Enlive po TID, each supplement provides 350 kcal and 20 grams of protein  Feeding assistance and encouragement   Pt would likely benefit from finger foods to assist with self feeding   NUTRITION DIAGNOSIS:   Increased nutrient needs related to acute illness (septic shock) as evidenced by estimated needs.  Being addressed via supplements  GOAL:   Patient will meet greater than or equal to 90% of their needs  Progressing  MONITOR:   PO intake,Supplement acceptance,Skin,Diet advancement,Weight trends,Labs,I & O's  REASON FOR ASSESSMENT:   Consult Diet education  ASSESSMENT:   55 year old lady with past medical history CVA with residual right-sided hemiparesis and global aphasia, hypertension and nephrolithiasis presents with syncopal episode. 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    5/13 R percutaneous nephrostomy tube place  Pt is aphasic from previous stroke but communicated today by nodding head yes or no. Pt unable to utilize right sided to feed self therefore some feeding difficulty. Pt drinks well using left arm but some difficulty utilizing utensils, pt may benefit from finger foods   Diet advanced to Regular 5/16 Pt ate 75% this AM for breakfast. Yesterday pt ate 25%at lunch, 0% at breakfast, unsure about dinner.  Pt drinking Ensure Enlive/Plus well; likes Strawberry  Current wt up to 75 kg; admit weight around 71 kg  Neprhostomy with 975 mL in 24 hours, 200 mL thus far today  Labs: sodium 134 (L), phosphorus 2.3 (L), CBGs 112-263 Meds:ss novolog, KCl, sodium phosphate    Diet Order:   Diet Order            Diet regular Room service appropriate? Yes; Fluid consistency: Thin  Diet effective now                 EDUCATION NEEDS:   Education needs have been addressed  Skin:  Skin Assessment: Reviewed RN Assessment  Last BM:   5/14  Height:   Ht Readings from Last 1 Encounters:  12/09/20 5\' 7"  (1.702 m)    Weight:   Wt Readings from Last 1 Encounters:  12/14/20 75 kg    BMI:  Body mass index is 25.9 kg/m.  Estimated Nutritional Needs:   Kcal:  1900-2100  Protein:  90-105 grams  Fluid:  >/= 1.9 L/day   Kerman Passey MS, RDN, LDN, CNSC Registered Dietitian III Clinical Nutrition RD Pager and On-Call Pager Number Located in [place]

## 2020-12-16 ENCOUNTER — Other Ambulatory Visit: Payer: Self-pay

## 2020-12-16 ENCOUNTER — Inpatient Hospital Stay (HOSPITAL_COMMUNITY)
Admission: RE | Admit: 2020-12-16 | Discharge: 2020-12-27 | DRG: 945 | Disposition: A | Discharge status: Home / Self Care | Payer: Managed Care, Other (non HMO) | Source: Intra-hospital | Attending: Physical Medicine & Rehabilitation | Admitting: Physical Medicine & Rehabilitation

## 2020-12-16 ENCOUNTER — Encounter (HOSPITAL_COMMUNITY): Payer: Self-pay | Admitting: Physical Medicine & Rehabilitation

## 2020-12-16 DIAGNOSIS — R338 Other retention of urine: Secondary | ICD-10-CM | POA: Diagnosis present

## 2020-12-16 DIAGNOSIS — R339 Retention of urine, unspecified: Secondary | ICD-10-CM | POA: Diagnosis not present

## 2020-12-16 DIAGNOSIS — Z880 Allergy status to penicillin: Secondary | ICD-10-CM

## 2020-12-16 DIAGNOSIS — R109 Unspecified abdominal pain: Secondary | ICD-10-CM

## 2020-12-16 DIAGNOSIS — R5381 Other malaise: Secondary | ICD-10-CM | POA: Diagnosis present

## 2020-12-16 DIAGNOSIS — Z8349 Family history of other endocrine, nutritional and metabolic diseases: Secondary | ICD-10-CM | POA: Diagnosis not present

## 2020-12-16 DIAGNOSIS — I69198 Other sequelae of nontraumatic intracerebral hemorrhage: Secondary | ICD-10-CM

## 2020-12-16 DIAGNOSIS — N390 Urinary tract infection, site not specified: Secondary | ICD-10-CM

## 2020-12-16 DIAGNOSIS — I10 Essential (primary) hypertension: Secondary | ICD-10-CM

## 2020-12-16 DIAGNOSIS — B9620 Unspecified Escherichia coli [E. coli] as the cause of diseases classified elsewhere: Secondary | ICD-10-CM | POA: Diagnosis present

## 2020-12-16 DIAGNOSIS — F4320 Adjustment disorder, unspecified: Secondary | ICD-10-CM | POA: Diagnosis present

## 2020-12-16 DIAGNOSIS — D696 Thrombocytopenia, unspecified: Secondary | ICD-10-CM | POA: Diagnosis present

## 2020-12-16 DIAGNOSIS — R739 Hyperglycemia, unspecified: Secondary | ICD-10-CM | POA: Diagnosis present

## 2020-12-16 DIAGNOSIS — E876 Hypokalemia: Secondary | ICD-10-CM | POA: Diagnosis present

## 2020-12-16 DIAGNOSIS — I69190 Apraxia following nontraumatic intracerebral hemorrhage: Secondary | ICD-10-CM

## 2020-12-16 DIAGNOSIS — R7401 Elevation of levels of liver transaminase levels: Secondary | ICD-10-CM

## 2020-12-16 DIAGNOSIS — Z87891 Personal history of nicotine dependence: Secondary | ICD-10-CM

## 2020-12-16 DIAGNOSIS — N200 Calculus of kidney: Secondary | ICD-10-CM | POA: Diagnosis present

## 2020-12-16 DIAGNOSIS — N12 Tubulo-interstitial nephritis, not specified as acute or chronic: Secondary | ICD-10-CM | POA: Diagnosis not present

## 2020-12-16 DIAGNOSIS — Z820 Family history of epilepsy and other diseases of the nervous system: Secondary | ICD-10-CM | POA: Diagnosis not present

## 2020-12-16 DIAGNOSIS — A419 Sepsis, unspecified organism: Secondary | ICD-10-CM | POA: Diagnosis not present

## 2020-12-16 DIAGNOSIS — I69118 Other symptoms and signs involving cognitive functions following nontraumatic intracerebral hemorrhage: Secondary | ICD-10-CM | POA: Diagnosis not present

## 2020-12-16 DIAGNOSIS — Z91013 Allergy to seafood: Secondary | ICD-10-CM | POA: Diagnosis not present

## 2020-12-16 DIAGNOSIS — R7989 Other specified abnormal findings of blood chemistry: Secondary | ICD-10-CM | POA: Diagnosis present

## 2020-12-16 DIAGNOSIS — I69120 Aphasia following nontraumatic intracerebral hemorrhage: Secondary | ICD-10-CM | POA: Diagnosis not present

## 2020-12-16 DIAGNOSIS — Z936 Other artificial openings of urinary tract status: Secondary | ICD-10-CM

## 2020-12-16 DIAGNOSIS — N179 Acute kidney failure, unspecified: Secondary | ICD-10-CM

## 2020-12-16 DIAGNOSIS — E871 Hypo-osmolality and hyponatremia: Secondary | ICD-10-CM

## 2020-12-16 DIAGNOSIS — Z8249 Family history of ischemic heart disease and other diseases of the circulatory system: Secondary | ICD-10-CM

## 2020-12-16 DIAGNOSIS — R2689 Other abnormalities of gait and mobility: Secondary | ICD-10-CM | POA: Diagnosis present

## 2020-12-16 DIAGNOSIS — R4701 Aphasia: Secondary | ICD-10-CM | POA: Diagnosis not present

## 2020-12-16 DIAGNOSIS — Z833 Family history of diabetes mellitus: Secondary | ICD-10-CM

## 2020-12-16 DIAGNOSIS — N201 Calculus of ureter: Secondary | ICD-10-CM | POA: Diagnosis not present

## 2020-12-16 LAB — CBC
HCT: 31.6 % — ABNORMAL LOW (ref 36.0–46.0)
Hemoglobin: 10.7 g/dL — ABNORMAL LOW (ref 12.0–15.0)
MCH: 29.8 pg (ref 26.0–34.0)
MCHC: 33.9 g/dL (ref 30.0–36.0)
MCV: 88 fL (ref 80.0–100.0)
Platelets: 98 10*3/uL — ABNORMAL LOW (ref 150–400)
RBC: 3.59 MIL/uL — ABNORMAL LOW (ref 3.87–5.11)
RDW: 14.5 % (ref 11.5–15.5)
WBC: 14.7 10*3/uL — ABNORMAL HIGH (ref 4.0–10.5)
nRBC: 0 % (ref 0.0–0.2)

## 2020-12-16 LAB — CULTURE, BLOOD (ROUTINE X 2)
Culture: NO GROWTH
Culture: NO GROWTH

## 2020-12-16 LAB — GLUCOSE, CAPILLARY
Glucose-Capillary: 108 mg/dL — ABNORMAL HIGH (ref 70–99)
Glucose-Capillary: 127 mg/dL — ABNORMAL HIGH (ref 70–99)
Glucose-Capillary: 173 mg/dL — ABNORMAL HIGH (ref 70–99)
Glucose-Capillary: 154 mg/dL — ABNORMAL HIGH (ref 70–99)

## 2020-12-16 LAB — URINE CULTURE: Culture: NO GROWTH

## 2020-12-16 MED ORDER — DOCUSATE SODIUM 100 MG PO CAPS: 100.0000 mg | ORAL_CAPSULE | Freq: Two times a day (BID) | ORAL | Status: DC | PRN

## 2020-12-16 MED ORDER — SODIUM CHLORIDE 0.9 % IV SOLN
2.0000 g | INTRAVENOUS | Status: DC
  Administered 2020-12-16: 2 g via INTRAVENOUS
  Filled 2020-12-16: qty 20
  Filled 2020-12-16: qty 2

## 2020-12-16 MED ORDER — QUETIAPINE FUMARATE 50 MG PO TABS
50.0000 mg | ORAL_TABLET | Freq: Two times a day (BID) | ORAL | Status: DC
  Administered 2020-12-16 – 2020-12-27 (×22): 50 mg via ORAL
  Filled 2020-12-16 (×22): qty 1

## 2020-12-16 MED ORDER — PROCHLORPERAZINE EDISYLATE 10 MG/2ML IJ SOLN: 5.0000 mg | Freq: Four times a day (QID) | INTRAMUSCULAR | Status: DC | PRN

## 2020-12-16 MED ORDER — CHLORHEXIDINE GLUCONATE 0.12 % MT SOLN
15.0000 mL | Freq: Two times a day (BID) | OROMUCOSAL | Status: DC
  Administered 2020-12-16 – 2020-12-23 (×13): 15 mL via OROMUCOSAL
  Filled 2020-12-16 (×19): qty 15

## 2020-12-16 MED ORDER — INSULIN ASPART 100 UNIT/ML IJ SOLN
0.0000 [IU] | Freq: Three times a day (TID) | INTRAMUSCULAR | Status: DC
  Administered 2020-12-16: 3 [IU] via SUBCUTANEOUS
  Administered 2020-12-17 (×2): 2 [IU] via SUBCUTANEOUS
  Administered 2020-12-18 (×3): 1 [IU] via SUBCUTANEOUS
  Administered 2020-12-19 (×2): 2 [IU] via SUBCUTANEOUS
  Administered 2020-12-20 – 2020-12-24 (×7): 1 [IU] via SUBCUTANEOUS
  Administered 2020-12-25: 2 [IU] via SUBCUTANEOUS
  Administered 2020-12-25: 1 [IU] via SUBCUTANEOUS

## 2020-12-16 MED ORDER — ACETAMINOPHEN 325 MG PO TABS
325.0000 mg | ORAL_TABLET | ORAL | Status: DC | PRN
  Administered 2020-12-18 – 2020-12-26 (×3): 650 mg via ORAL
  Filled 2020-12-16 (×3): qty 2

## 2020-12-16 MED ORDER — HYPROMELLOSE (GONIOSCOPIC) 2.5 % OP SOLN
1.0000 [drp] | Freq: Three times a day (TID) | OPHTHALMIC | Status: DC | PRN
  Filled 2020-12-16: qty 15

## 2020-12-16 MED ORDER — FLEET ENEMA 7-19 GM/118ML RE ENEM: 1.0000 | ENEMA | Freq: Once | RECTAL | Status: DC | PRN

## 2020-12-16 MED ORDER — BISACODYL 10 MG RE SUPP: 10.0000 mg | Freq: Every day | RECTAL | Status: DC | PRN

## 2020-12-16 MED ORDER — ENSURE ENLIVE PO LIQD: 237.0000 mL | Freq: Three times a day (TID) | ORAL | Status: DC

## 2020-12-16 MED ORDER — ORAL CARE MOUTH RINSE
15.0000 mL | Freq: Two times a day (BID) | OROMUCOSAL | Status: DC
  Administered 2020-12-16 – 2020-12-23 (×15): 15 mL via OROMUCOSAL

## 2020-12-16 MED ORDER — PROCHLORPERAZINE MALEATE 5 MG PO TABS: 5.0000 mg | ORAL_TABLET | Freq: Four times a day (QID) | ORAL | Status: DC | PRN

## 2020-12-16 MED ORDER — SERTRALINE HCL 50 MG PO TABS
50.0000 mg | ORAL_TABLET | Freq: Every day | ORAL | Status: DC
  Administered 2020-12-17 – 2020-12-27 (×11): 50 mg via ORAL
  Filled 2020-12-16 (×12): qty 1

## 2020-12-16 MED ORDER — ORAL CARE MOUTH RINSE: 15.0000 mL | Freq: Two times a day (BID) | OROMUCOSAL | Status: DC

## 2020-12-16 MED ORDER — PROCHLORPERAZINE 25 MG RE SUPP: 12.5000 mg | Freq: Four times a day (QID) | RECTAL | Status: DC | PRN

## 2020-12-16 MED ORDER — ENSURE MAX PROTEIN PO LIQD
11.0000 [oz_av] | Freq: Three times a day (TID) | ORAL | Status: DC
  Administered 2020-12-16 – 2020-12-22 (×11): 11 [oz_av] via ORAL

## 2020-12-16 MED ORDER — CHLORHEXIDINE GLUCONATE CLOTH 2 % EX PADS
6.0000 | MEDICATED_PAD | Freq: Every day | CUTANEOUS | Status: DC
  Administered 2020-12-17 – 2020-12-23 (×3): 6 via TOPICAL

## 2020-12-16 MED ORDER — ALUM & MAG HYDROXIDE-SIMETH 200-200-20 MG/5ML PO SUSP
30.0000 mL | ORAL | Status: DC | PRN
  Administered 2020-12-18: 30 mL via ORAL
  Filled 2020-12-16: qty 30

## 2020-12-16 MED ORDER — GUAIFENESIN-DM 100-10 MG/5ML PO SYRP: 5.0000 mL | ORAL_SOLUTION | Freq: Four times a day (QID) | ORAL | Status: DC | PRN

## 2020-12-16 MED ORDER — DIPHENHYDRAMINE HCL 12.5 MG/5ML PO ELIX: 12.5000 mg | ORAL_SOLUTION | Freq: Four times a day (QID) | ORAL | Status: DC | PRN

## 2020-12-16 MED ORDER — INSULIN ASPART 100 UNIT/ML IJ SOLN
0.0000 [IU] | Freq: Every day | INTRAMUSCULAR | Status: DC
Start: 2020-12-16 — End: 2020-12-27
  Administered 2020-12-25: 2 [IU] via SUBCUTANEOUS

## 2020-12-16 MED ORDER — LIDOCAINE HCL URETHRAL/MUCOSAL 2 % EX GEL: CUTANEOUS | Status: DC | PRN

## 2020-12-16 MED ORDER — POLYETHYLENE GLYCOL 3350 17 G PO PACK
17.0000 g | PACK | Freq: Every day | ORAL | Status: DC | PRN
  Administered 2020-12-18: 17 g via ORAL
  Filled 2020-12-16: qty 1

## 2020-12-16 MED ORDER — ATORVASTATIN CALCIUM 10 MG PO TABS
20.0000 mg | ORAL_TABLET | Freq: Every day | ORAL | Status: DC
  Administered 2020-12-17 – 2020-12-27 (×11): 20 mg via ORAL
  Filled 2020-12-16 (×12): qty 2

## 2020-12-16 MED ORDER — TRAZODONE HCL 50 MG PO TABS: 25.0000 mg | ORAL_TABLET | Freq: Every evening | ORAL | Status: DC | PRN

## 2020-12-16 NOTE — Progress Notes (Signed)
Occupational Therapy Treatment Patient Details Name: Melanie Madden MRN: [id number] DOB: 09/15/1965 Today's Date: 12/16/2020    History of present illness Patient is 55 year old female  who was brought from home to the ER with syncopal episode, pt found to have septic shock from  infection emphysematous pyelonephritis with infected kidney stones. Pt taken to IR for percutaneous nephrostomy tube.  PMH: stroke with right-sided hemiplegia and global aphasia, hypertension   OT comments  Assisting pt with transfer to CIR this session. Pt wanted to don shoes and socks prior to transfer, OT provided max assist for this task due to pt's core weakness and LUE weakness to don her shoe with the TLSO. Pt worked on bed mobility and sit<>stands x2 for transfer to CIR bed. Pt making progress towards OT goals and expected to do well with CIR. If pt were to continue to stay in acute care, OT would continue to follow to assist with progression of her goals.    Follow Up Recommendations  CIR    Equipment Recommendations  None recommended by OT    Recommendations for Other Services      Precautions / Restrictions Precautions Precautions: Fall Precaution Comments: global aphasic, residual R hemiplegia Restrictions Weight Bearing Restrictions: No       Mobility Bed Mobility Overal bed mobility: Needs Assistance Bed Mobility: Supine to Sit;Sit to Supine     Supine to sit: Min assist Sit to supine: Mod assist   General bed mobility comments: Pt able to move LLE OOB and assist with elevating trunk with LUE, however OT had to assist with RLE. To get in bed, OT had to assist with both legs back to bed and repositioning.    Transfers Overall transfer level: Needs assistance Equipment used: 2 person hand held assist Transfers: Sit to/from Stand Sit to Stand: +2 physical assistance;+2 safety/equipment;Mod assist Stand pivot transfers: Mod assist;+2 physical assistance       General  transfer comment: sit<>stand and stand pivot 2x, HHA works better than a RW due to flaccidity in RUE    Balance Overall balance assessment: Needs assistance Sitting-balance support: Feet supported Sitting balance-Leahy Scale: Fair     Standing balance support: Bilateral upper extremity supported Standing balance-Leahy Scale: Poor Standing balance comment: dependent on physical assist                           ADL either performed or assessed with clinical judgement   ADL Overall ADL's : Needs assistance/impaired                     Lower Body Dressing: Maximal assistance;Sitting/lateral leans Lower Body Dressing Details (indicate cue type and reason): Pt required max assist to don sock and shoe with TLSO on RLE. Pt attempting to assist, however was limited due to hand strength and core strength Toilet Transfer: Moderate assistance;+2 for physical assistance;+2 for safety/equipment;Stand-pivot Toilet Transfer Details (indicate cue type and reason): Pt standing with less assistance today, transferred x2 from bed to recliner and recliner to bed, to assist staff with transferring pt to CIR.         Functional mobility during ADLs: Moderate assistance;+2 for physical assistance;+2 for safety/equipment General ADL Comments: Pt completed x2 transfers this session with mod assist for transfer due to weakness in RLE. HHA +2 works best for stability.     Vision       Perception     Praxis  Cognition Arousal/Alertness: Awake/alert Behavior During Therapy: WFL for tasks assessed/performed Overall Cognitive Status: History of cognitive impairments - at baseline                                 General Comments: pt globally aphasic, had difficulty answering questions via head nodding when asked orientation questions, pt did follow all commands provide great effort. Able to point and gesture to communicate some needs.        Exercises      Shoulder Instructions       General Comments VSS on RA    Pertinent Vitals/ Pain       Pain Assessment: No/denies pain  Home Living                                          Prior Functioning/Environment              Frequency  Min 2X/week        Progress Toward Goals  OT Goals(current goals can now be found in the care plan section)  Progress towards OT goals: Progressing toward goals  Acute Rehab OT Goals Patient Stated Goal: didn't state OT Goal Formulation: Patient unable to participate in goal setting Time For Goal Achievement: 12/28/20 Potential to Achieve Goals: Good ADL Goals Pt Will Perform Grooming: with mod assist;standing Pt Will Transfer to Toilet: with min guard assist;stand pivot transfer;bedside commode Additional ADL Goal #1: Pt will tolerate standing for 3 mins in preparation for grooming activities at the sink Additional ADL Goal #2: Pt will ambulate 5 ft with 1 person handheld assist or use of quad cane.  Plan Other (comment) (Pt transferred to CIR this session)    Co-evaluation                 AM-PAC OT "6 Clicks" Daily Activity     Outcome Measure   Help from another person eating meals?: A Little Help from another person taking care of personal grooming?: A Little Help from another person toileting, which includes using toliet, bedpan, or urinal?: A Lot Help from another person bathing (including washing, rinsing, drying)?: A Lot Help from another person to put on and taking off regular upper body clothing?: A Little Help from another person to put on and taking off regular lower body clothing?: A Lot 6 Click Score: 15    End of Session Equipment Utilized During Treatment: Rolling walker  OT Visit Diagnosis: Unsteadiness on feet (R26.81);Muscle weakness (generalized) (M62.81);Ataxia, unspecified (R27.0);Apraxia (R48.2)   Activity Tolerance Patient tolerated treatment well   Patient Left in bed;with  nursing/sitter in room;Other (comment) (With CIR staff transferring to 4th floor)   Nurse Communication Mobility status        Time: 7591-6384 OT Time Calculation (min): 24 min  Charges: OT General Charges $OT Visit: 1 Visit OT Treatments $Self Care/Home Management : 23-37 mins  Hevin Jeffcoat H., OTR/L [place] 12/16/2020, 4:05 PM

## 2020-12-16 NOTE — Progress Notes (Signed)
Called pt husband back. He called for an update and will be in to visit when he gets off work.  At night, blinds need closed, all lights off, and the room quiet for her to be able to sleep.

## 2020-12-16 NOTE — H&P (Signed)
Physical Medicine and Rehabilitation Admission H&P    Chief Complaint  Patient presents with  . Loss of Consciousness   HPI: Melanie Madden. 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 She was treated with fluids, pressors, broad spectrum antibiotics and right nephrostomy tube placed by interventional radiology.  Blood cultures 2/4 were positive for Enterobacterales and E. Coli. Dr. Unknown Foley consulted and recommended supportive care with outpatient follow up for stone extraction in 2-3 weeks, once infection cleared. AKI resolving with downward trend in SCr but did have rise in WBCs from 19.6 on 05/19 therefore urine from urostomy cultured and showed not growth. She has had issues with intermittent hypokalemia which has been supplemented, hypoglycemia felt to be due to poor intake as well as thrombocytopenia due to sepsis. Therapy initiated and patient with resulting functional deficits with cognition, weakness, self-care. At baseline, patient had a caregiver during the day but was able to stand and take a few steps?  Patient noted to be deconditioned with decline in functional status. CIR recommended due to functional decline.  Please see preadmission assessment from earlier today as well.  Review of Systems  Unable to perform ROS: Language   Past Medical History:  Diagnosis Date  . Allergy   . Anxiety   . Arthritis   . High triglycerides   . Kidney stones   . Migraines   . Recurrent sinus infections     Past Surgical History:  Procedure Laterality Date  . ANTERIOR CRUCIATE LIGAMENT REPAIR Left   . BUNIONECTOMY Right   . IR NEPHROSTOMY PLACEMENT RIGHT  12/09/2020  .  LASIK    . LITHOTRIPSY    . MENISCUS REPAIR Left   . NASAL SEPTUM SURGERY    . SHOULDER SURGERY    . VARICOSE VEIN SURGERY Left     Family History  Problem Relation Age of Onset  . Alzheimer's disease Mother   . Hypertension Mother   . Diabetes Mother   . Thyroid disease Mother   . Deep vein thrombosis Maternal Grandmother   . Colon cancer Neg Hx   . Colon polyps Neg Hx   . Esophageal cancer Neg Hx   . Pancreatic cancer Neg Hx   . Rectal cancer Neg Hx   . Stomach cancer Neg Hx   . Breast cancer Neg Hx     Social History:  reports that she has quit smoking. She has never used smokeless tobacco. She reports current alcohol use. She reports that she does not use drugs.   Allergies  Allergen Reactions  . Amoxicillin Nausea Only  . Shellfish Allergy Diarrhea and Nausea And Vomiting    Mussels     Medications Prior to Admission  Medication Sig Dispense Refill  . amLODipine (NORVASC) 10 MG tablet Take 10 mg by mouth daily.    Marland Kitchen atorvastatin (LIPITOR) 20 MG tablet Take 1 tablet (20 mg total) by mouth daily at 6 PM. 30 tablet 1  . B Complex-C (B-COMPLEX WITH VITAMIN C) tablet Take 1 tablet by mouth daily.    Marland Kitchen CALCIUM PO Take 1 tablet by mouth daily.    . cholecalciferol (VITAMIN D) 1000 units tablet Take 1,000 Units by mouth daily.  2,000 units daily    . docusate sodium (COLACE) 100 MG capsule Take 300 mg by mouth daily.    . magnesium 30 MG tablet Take 30 mg by mouth daily.    . Omega-3 Fatty Acids (FISH OIL) 1200 MG CPDR Take 1 capsule by mouth daily.    . QUEtiapine (SEROQUEL) 50 MG tablet Take 1 tablet (50 mg total) by mouth 2 (two) times daily. 60 tablet 1  . sertraline (ZOLOFT) 50 MG tablet Take 1 tablet (50 mg total) by mouth daily. 30 tablet 1  . VITAMIN E PO Take 1 tablet by mouth daily.     Marland Kitchen zinc sulfate 220 (50 Zn) MG capsule Take 220 mg by mouth daily.      Drug Regimen Review  Drug regimen was reviewed and remains appropriate with no significant issues  identified  Home: Home Living Family/patient expects to be discharged to:: Private residence Living Arrangements: Spouse/significant other Available Help at Discharge: Family,Available 24 hours/day,Personal care attendant Type of Home: House Home Access: Stairs to enter Entrance Stairs-Number of Steps: 2-3 Entrance Stairs-Rails: None Home Layout: Able to live on main level with bedroom/bathroom,Two level Alternate Level Stairs-Number of Steps: flight Alternate Level Stairs-Rails:  (uses chair lift) Bathroom Shower/Tub: Chiropodist: Standard Bathroom Accessibility: Yes Home Equipment: Tub bench,Wheelchair - manual,Cane - quad,Grab bars - toilet,Grab bars - tub/shower,Bedside commode   Functional History: Prior Function Level of Independence: Needs assistance Gait / Transfers Assistance Needed: able to amb with quad cane short distances, w/c for long distances, able to independently get in/out of chair lift and buckling self in ADL's / Homemaking Assistance Needed: supervision Comments: pt also going to outpt PT 2x/wk  Functional Status:  Mobility: Bed Mobility Overal bed mobility: Needs Assistance Bed Mobility: Supine to Sit Supine to sit: Min assist,Mod assist,+2 for physical assistance,HOB elevated General bed mobility comments: up in chair on arrival to room Transfers Overall transfer level: Needs assistance Equipment used: 2 person hand held assist Transfers: Sit to/from Stand Sit to Stand: +2 physical assistance,+2 safety/equipment,Mod assist Stand pivot transfers: Mod assist,+2 physical assistance General transfer comment: Sit<>stand performed 6x. Multimodal cues to encourage weight shift to the R LE and to correct flexed posture. Ambulation/Gait General Gait Details: unable this date    ADL: ADL Overall ADL's : Needs assistance/impaired Eating/Feeding: Set up,Sitting Eating/Feeding Details (indicate cue type and reason): able to bring cup to  mouth to drin Grooming: Wash/dry face,Wash/dry hands,Set up,Sitting Grooming Details (indicate cue type and reason): completed sitting in recliner Upper Body Bathing: Moderate assistance,Sitting Upper Body Bathing Details (indicate cue type and reason): Pt needs assist due to RUE flaccidity Lower Body Bathing: Maximal assistance,Sitting/lateral leans,Sit to/from stand Lower Body Bathing Details (indicate cue type and reason): Pt unable to complete LB bathing with out max assit for pericare and reaching both feet Upper Body Dressing : Minimal assistance,Sitting Upper Body Dressing Details (indicate cue type and reason): Min assist due to RUE hemiplegia Lower Body Dressing: Maximal assistance,Sitting/lateral leans Lower Body Dressing Details (indicate cue type and reason): Pt requires max assist to bring feet into figure 4, then has difficulty maintaining posture to complete dressing. Toilet Transfer: Maximal assistance,+2 for physical assistance,+2 for safety/equipment,Stand-pivot,BSC,RW Toilet Transfer Details (indicate cue type and reason): Pt requires max +2 to stand and due to RLE hemiplegia, pt has difficulty pivoting once in standing Toileting- Clothing Manipulation and Hygiene: Total assistance,+2 for physical assistance,Sitting/lateral lean,Sit to/from stand Toileting - Clothing Manipulation Details (indicate cue type and reason): Pt  requires total assist due to balance and RUE hemiplegia Tub/ Shower Transfer: Maximal assistance,+2 for physical assistance,+2 for safety/equipment,Stand-pivot,Tub bench Tub/Shower Transfer Details (indicate cue type and reason): Pt requires max assist +2 to stand and unable to pivot without max A +2 due to RLE hemiplegia Functional mobility during ADLs: Maximal assistance,+2 for physical assistance,+2 for safety/equipment,Rolling walker General ADL Comments: Pt requires max +2 for standing and attempting to take a step. Pt able to wiggle RLE in standing, but  having difficulties taking steps at this time. Pt requiring min-max assist for all dressing and bathing, and setup to min assist for grooming.  Cognition: Cognition Overall Cognitive Status: History of cognitive impairments - at baseline Orientation Level: Other (comment) (UTA) Cognition Arousal/Alertness: Awake/alert Behavior During Therapy: WFL for tasks assessed/performed Overall Cognitive Status: History of cognitive impairments - at baseline General Comments: pt globally aphasic, had difficulty answering questions via head nodding when asked orientation questions, pt did follow all commands provide great effort. Able to point and gesture to communicate some needs.   Blood pressure 128/78, pulse 95, temperature 98.6 F (37 C), temperature source Oral, resp. rate (!) 21, height 5\' 7"  (1.702 m), weight 73 kg, SpO2 97 %. Physical Exam Vitals and nursing note reviewed.  Constitutional:      General: She is not in acute distress.    Appearance: Normal appearance. She is not ill-appearing.     Comments: Left nephrostomy drain with 200 cc clear urine some layering blood. Dry dressing right neck at prior IJ site.   HENT:     Head: Normocephalic and atraumatic.     Right Ear: External ear normal.     Left Ear: External ear normal.     Nose: Nose normal.  Eyes:     General:        Right eye: No discharge.        Left eye: No discharge.     Extraocular Movements: Extraocular movements intact.  Cardiovascular:     Rate and Rhythm: Normal rate and regular rhythm.  Pulmonary:     Effort: Pulmonary effort is normal. No respiratory distress.     Breath sounds: No stridor.  Abdominal:     General: Abdomen is flat. Bowel sounds are normal. There is no distension.  Genitourinary:    Comments: Foley in place Musculoskeletal:     Cervical back: Normal range of motion and neck supple.     Comments: Min edema RUE/RLE  Skin:    General: Skin is warm and dry.  Neurological:     Mental  Status: She is alert.     Comments: Alert Right facial weakness Global aphasia Nonverbal Motor: Limited to participation, however moving left and and ankle No movement noted on right side  Psychiatric:     Comments: Unable to assess due to mentation     Results for orders placed or performed during the hospital encounter of 12/09/20 (from the past 48 hour(s))  Glucose, capillary     Status: Abnormal   Collection Time: 12/14/20 12:04 PM  Result Value Ref Range   Glucose-Capillary 178 (H) 70 - 99 mg/dL    Comment: Glucose reference range applies only to samples taken after fasting for at least 8 hours.  Glucose, capillary     Status: Abnormal   Collection Time: 12/14/20  5:07 PM  Result Value Ref Range   Glucose-Capillary 263 (H) 70 - 99 mg/dL    Comment: Glucose reference range applies only to samples taken after fasting  for at least 8 hours.  Glucose, capillary     Status: Abnormal   Collection Time: 12/14/20  9:19 PM  Result Value Ref Range   Glucose-Capillary 204 (H) 70 - 99 mg/dL    Comment: Glucose reference range applies only to samples taken after fasting for at least 8 hours.  Phosphorus     Status: Abnormal   Collection Time: 12/15/20  3:25 AM  Result Value Ref Range   Phosphorus 2.3 (L) 2.5 - 4.6 mg/dL    Comment: Performed at [facility] [address], [place], [place] [address]  Comprehensive metabolic panel     Status: Abnormal   Collection Time: 12/15/20  3:25 AM  Result Value Ref Range   Sodium 134 (L) 135 - 145 mmol/L   Potassium 4.6 3.5 - 5.1 mmol/L   Chloride 108 98 - 111 mmol/L   CO2 18 (L) 22 - 32 mmol/L   Glucose, Bld 158 (H) 70 - 99 mg/dL    Comment: Glucose reference range applies only to samples taken after fasting for at least 8 hours.   BUN 47 (H) 6 - 20 mg/dL   Creatinine, Ser 1.48 (H) 0.44 - 1.00 mg/dL   Calcium 8.5 (L) 8.9 - 10.3 mg/dL   Total Protein 5.4 (L) 6.5 - 8.1 g/dL   Albumin 1.8 (L) 3.5 - 5.0 g/dL   AST 58 (H) 15 - 41 U/L    ALT 87 (H) 0 - 44 U/L   Alkaline Phosphatase 188 (H) 38 - 126 U/L   Total Bilirubin 0.7 0.3 - 1.2 mg/dL   GFR, Estimated 42 (L) >60 mL/min    Comment: (NOTE) Calculated using the CKD-EPI Creatinine Equation (2021)    Anion gap 8 5 - 15    Comment: Performed at [facility], [place] [address], [place], [place] [address]  CBC     Status: Abnormal   Collection Time: 12/15/20  3:25 AM  Result Value Ref Range   WBC 19.6 (H) 4.0 - 10.5 K/uL   RBC 3.93 3.87 - 5.11 MIL/uL   Hemoglobin 11.5 (L) 12.0 - 15.0 g/dL   HCT 33.5 (L) 36.0 - 46.0 %   MCV 85.2 80.0 - 100.0 fL   MCH 29.3 26.0 - 34.0 pg   MCHC 34.3 30.0 - 36.0 g/dL   RDW 14.4 11.5 - 15.5 %   Platelets 53 (L) 150 - 400 K/uL    Comment: Immature Platelet Fraction may be clinically indicated, consider ordering this additional test [id number] CONSISTENT WITH PREVIOUS RESULT REPEATED TO VERIFY    nRBC 0.0 0.0 - 0.2 %    Comment: Performed at [facility], [place] [address] [place], [place] [address]  Magnesium     Status: None   Collection Time: 12/15/20  3:25 AM  Result Value Ref Range   Magnesium 1.7 1.7 - 2.4 mg/dL    Comment: Performed at [facility] [address] [place], [place] [address]  Glucose, capillary     Status: Abnormal   Collection Time: 12/15/20  7:27 AM  Result Value Ref Range   Glucose-Capillary 125 (H) 70 - 99 mg/dL    Comment: Glucose reference range applies only to samples taken after fasting for at least 8 hours.  Glucose, capillary     Status: Abnormal   Collection Time: 12/15/20 11:53 AM  Result Value Ref Range   Glucose-Capillary 245 (H) 70 - 99 mg/dL    Comment: Glucose reference range applies  only to samples taken after fasting for at least 8 hours.  Urine Culture     Status: None   Collection Time: 12/15/20 12:53 PM   Specimen: Urine, Random  Result Value Ref Range   Specimen Description URINE, RANDOM    Special Requests NONE    Culture      NO GROWTH Performed at [facility], [place] [address] [place], [place] [address]    Report Status 12/16/2020 FINAL   Glucose, capillary     Status: Abnormal   Collection Time: 12/15/20  6:00 PM  Result Value Ref Range   Glucose-Capillary 179 (H) 70 - 99 mg/dL    Comment: Glucose reference range applies only to samples taken after fasting for at least 8 hours.  Glucose, capillary     Status: Abnormal   Collection Time: 12/15/20  8:57 PM  Result Value Ref Range   Glucose-Capillary 186 (H) 70 - 99 mg/dL    Comment: Glucose reference range applies only to samples taken after fasting for at least 8 hours.  Glucose, capillary     Status: Abnormal   Collection Time: 12/16/20  7:45 AM  Result Value Ref Range   Glucose-Capillary 108 (H) 70 - 99 mg/dL    Comment: Glucose reference range applies only to samples taken after fasting for at least 8 hours.  CBC     Status: Abnormal   Collection Time: 12/16/20  9:28 AM  Result Value Ref Range   WBC 14.7 (H) 4.0 - 10.5 K/uL   RBC 3.59 (L) 3.87 - 5.11 MIL/uL   Hemoglobin 10.7 (L) 12.0 - 15.0 g/dL   HCT 31.6 (L) 36.0 - 46.0 %   MCV 88.0 80.0 - 100.0 fL   MCH 29.8 26.0 - 34.0 pg   MCHC 33.9 30.0 - 36.0 g/dL   RDW 14.5 11.5 - 15.5 %   Platelets 98 (L) 150 - 400 K/uL    Comment: Immature Platelet Fraction may be clinically indicated, consider ordering this additional test [id number] CONSISTENT WITH PREVIOUS RESULT REPEATED TO VERIFY    nRBC 0.0 0.0 - 0.2 %    Comment: Performed at [facility], [place] [address] [place], [place] [address]   No results found.     Medical Problem List and Plan: 1.  Deficits with cognition, mobility, transfers, self-care debility with history of secondary to debility with history of left basal ganglia ICH with resulting global aphasia.  -patient may shower  -ELOS/Goals: 12-15 days/min a  Admit to CIR 2.  Antithrombotics: -DVT/anticoagulation:  Mechanical: Sequential compression devices, below knee Bilateral lower  extremities  -antiplatelet therapy: N/A 3. 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  4. Mood: LCSW to follow for evaluation and support.   -antipsychotic agents: N/A 5. Neuropsych: This patient is not capable of making decisions on her own behalf. 6. Skin/Wound Care: Routine pressure relief measures.  7. Fluids/Electrolytes/Nutrition: Monitor I/O.   CMP ordered 8. E.coli UTI: antibiotics narrowed to IV ceftriaxone--Day #7/14 .  9. Left emphysematous pyelitis s/p nephrostomy: Continue to monitor output dily.   --Radiology following for input.   --Foley remains in place-->will follow up with urology re: timing of removal. 10. Leucocytosis:  Continues to vary. Monitor for fevers and any signs of decompensation. .    CBC ordered 11. Thrombocytopenia: Improving from 65-->25-->98  CBC ordered 12. Abnormal LFTs:   CMP ordered 13. Acute kidney injury:  Improving from 43/3.36-->47/1.48.  Continue to monitor for recovery.  CMP ordered 14. HTN:  Hypotension has resolved. Continue to hold norvasc.   Monitor increase mobility 15. Adjustment disorder: Managed on Seroquel bid. 16. Stress induced hyperglycemia: Hgb A1C-5.2 but BS labile  --Continue to monitor with ac/hs CBG checks.  --likely elevated due to ensure between meals.   Bary Leriche, PA-C 12/16/2020  I have personally performed a face to face diagnostic evaluation, including, but not limited to relevant history and physical exam findings, of this patient and developed relevant assessment and plan.  Additionally, I have reviewed and concur with the physician assistant's documentation above.  Delice Lesch, MD, ABPMR

## 2020-12-16 NOTE — Progress Notes (Signed)
Referring Physician(s): Dr. Lovena Neighbours, C.   Supervising Physician: Daryll Brod  Patient Status:  [facility] - In-pt  Chief Complaint:  S/p right PCN catheter placement on 5/13 with Dr. Pascal Lux   Subjective:  Pt sitting in a bed, MD at the bedside. Patient is going to rehab soon.  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 ROS was not performed.   Allergies: Amoxicillin and Shellfish allergy  Medications: Prior to Admission medications   Medication Sig Start Date End Date Taking? Authorizing Provider  amLODipine (NORVASC) 10 MG tablet Take 10 mg by mouth daily.   Yes [provider]  atorvastatin (LIPITOR) 20 MG tablet Take 1 tablet (20 mg total) by mouth daily at 6 PM. 08/20/19  Yes Angiulli, Lavon Paganini, PA-C  B Complex-C (B-COMPLEX WITH VITAMIN C) tablet Take 1 tablet by mouth daily.   Yes [provider]  CALCIUM PO Take 1 tablet by mouth daily.   Yes [provider]  cholecalciferol (VITAMIN D) 1000 units tablet Take 1,000 Units by mouth daily. 2,000 units daily   Yes [provider]  docusate sodium (COLACE) 100 MG capsule Take 300 mg by mouth daily.   Yes [provider]  magnesium 30 MG tablet Take 30 mg by mouth daily.   Yes [provider]  Omega-3 Fatty Acids (FISH OIL) 1200 MG CPDR Take 1 capsule by mouth daily.   Yes [provider]  QUEtiapine (SEROQUEL) 50 MG tablet Take 1 tablet (50 mg total) by mouth 2 (two) times daily. 11/10/20  Yes Kirsteins, [name], MD  sertraline (ZOLOFT) 50 MG tablet Take 1 tablet (50 mg total) by mouth daily. 11/10/20  Yes Kirsteins, [name], MD  VITAMIN E PO Take 1 tablet by mouth daily.    Yes [provider]  zinc sulfate 220 (50 Zn) MG capsule Take 220 mg by mouth daily.   Yes [provider]  gabapentin (NEURONTIN) 100 MG capsule Take 1 capsule (100 mg total) by mouth 3 (three) times daily. Patient not taking: Reported on 12/09/2020 05/06/20    Charlett Blake, MD  LORazepam (ATIVAN) 1 MG tablet Take 1 tablet (1 mg total) by mouth every 8 (eight) hours as needed for anxiety. Patient not taking: Reported on 12/09/2020 08/29/19   Milton Ferguson, MD     Vital Signs: BP 128/78 (BP Location: Left Arm)   Pulse 95   Temp 98.6 F (37 C) (Oral)   Resp (!) 21   Ht 5\' 7"  (1.702 m)   Wt 160 lb 15 oz (73 kg)   SpO2 97%   BMI 25.21 kg/m   Physical Exam Vitals reviewed.  Constitutional:      General: She is not in acute distress. HENT:     Head: Normocephalic and atraumatic.  Cardiovascular:     Rate and Rhythm: Normal rate.  Pulmonary:     Effort: Pulmonary effort is normal.  Abdominal:     General: Abdomen is flat.     Palpations: Abdomen is soft.     Comments: Positive right PCN drain to a gravity bag. Site is unremarkable with no erythema, edema, tenderness, bleeding or drainage. Suture and stat lock in place. Dressing is clean, dry, and intact. 100 mL of clear yellow fluid noted in the gravity bag. Drain aspirates and flushes well.   Skin:    General: Skin is warm and dry.  Neurological:     Mental Status: Mental status is at baseline.  Imaging: No results found.  Labs:  CBC: Recent Labs    12/13/20 0305 12/14/20 0229 12/15/20 0325 12/16/20 0928  WBC 15.1* 15.6* 19.6* 14.7*  HGB 11.8* 11.8* 11.5* 10.7*  HCT 33.9* 34.4* 33.5* 31.6*  PLT 23* 30* 53* 98*    COAGS: Recent Labs    12/10/20 0559  INR 1.8*    BMP: Recent Labs    12/19/19 2057 12/09/20 1748 12/12/20 0358 12/12/20 1000 12/13/20 0305 12/14/20 0229 12/15/20 0325  NA 140   < > 135  --  137 136 134*  K 4.1   < > 2.9* 4.2 3.2* 3.8 4.6  CL 103   < > 108  --  113* 112* 108  CO2 28   < > 17*  --  18* 18* 18*  GLUCOSE 116*   < > 112*  --  122* 123* 158*  BUN 18   < > 52*  --  46* 45* 47*  CALCIUM 10.1   < > 8.5*  --  8.7* 8.7* 8.5*  CREATININE 0.85   < > 2.54*  --  2.24* 1.69* 1.48*  GFRNONAA >60   < > 22*  --  25* 35* 42*   GFRAA >60  --   --   --   --   --   --    < > = values in this interval not displayed.    LIVER FUNCTION TESTS: Recent Labs    12/19/19 2057 12/09/20 1748 12/11/20 1548 12/15/20 0325  BILITOT 0.5 1.1 0.8 0.7  AST 21 38 28 58*  ALT 32 35 44 87*  ALKPHOS 58 128* 97 188*  PROT 6.6 5.4* 5.0* 5.4*  ALBUMIN 3.6 2.7* 1.9* 1.8*    Assessment and Plan: 55 year old female admitted due to urosepsis, s/p right PCN catheter placement  on 12/09/2020 due to emphysematous pyelitis, mild right hydronephrosis with obstructing nephrolithiasis.   Afebrile WBC trending down, Hgb stable, thrombocytopenia improving  Right PCN intact, site clean and dry, flushes/aspirates well. OP 1,250 cc  Blood cx negative to date, Urine culture pending. Fluid in the gravity bad appears clear urine, will defer management of the PCN to urology.   Further treatment plan per [facility]/Urology/ PCCM   Appreciate and agree with the plan.  Please call IR for questions and concerns regarding the right PCN.    Electronically Signed: Tera Mater, PA-C 12/16/2020, 10:50 AM   I spent a total of 15 Minutes at the the patient's bedside AND on the patient's hospital floor or unit, greater than 50% of which was counseling/coordinating care for right PCN catheter

## 2020-12-16 NOTE — Discharge Summary (Signed)
DISCHARGE SUMMARY  Melanie Madden [facility]  MR#: [id number]  DOB:21-Mar-1966  Date of Admission: 12/09/2020 Date of Discharge: 12/16/2020  Attending Physician:Aleera Gilcrease Hennie Duos, MD  Patient's EXH:BZJIR, Mechele Claude, MD  Consults: Urology PCCM IR PMR  Disposition: D/C to CIR   Follow-up Appts:  Follow-up Information    Ceasar Mons, MD In 2 weeks.   Specialty: Urology Why: My office will call to schedule your follow-up appointment  Contact information: [address] [place] [address] [phone]        Fanny Bien, MD Follow up.   Specialty: Family Medicine Contact information: [address] [place] [address] [phone]               Tests Needing Follow-up: -IR to follow percutaneous nephrostomy tube -consider transitioning to oral abx if pt continues to improve clinically  Discharge Diagnoses: Septic shock POA E. coli bacteremia Emphysematous pyelonephritis with infected kidney stones Right obstructing kidney stone Obstructive uropathy Acute kidney failure Hypokalemia Hypophosphatemia Thrombocytopenia Hypoglycemia Prior stroke with right hemiplegia and aphasia   Initial presentation: 55yo with a history of stroke resulting in right-sided hemiplegia + global aphasia and HTN who was brought to the ER following a syncopal spell while trying to stand from a seated position.  In the ER she was found to be hypotensive and did not respond to a 3 L fluid bolus.  Levophed was initiated.  CT abdomen and pelvis noted emphysematous pyelitis and obstructive hydronephrosis.  Patient required emergent percutaneous nephrostomy tube by IR and was admitted to the ICU.  Hospital Course:  Septic shock POA - E. coli bacteremia - emphysematous pyelonephritis with infected kidney stones Pan sensitive E. coli noted on blood culture - continue ceftriaxone until ready for discharge then can consider transitioning to oral  treatment to complete 14 total days - clinically much improved/stable - monitor WBC intermittently   Right obstructing kidney stone -obstructive uropathy with acute kidney failure Right percutaneous nephrostomy to remain in place -outpatient Urology follow-up for cystoscopy ureteroscopy and stone removal - renal function improving steadily at time of d/c   Hypokalemia Corrected with supplementation  Hypophosphatemia Improved with supplementation -monitor intermittently  Thrombocytopenia Due to gram-negative rod sepsis - improving - no spontaneous bleeding appreciated   Hypoglycemia Due to limited/poor intake - appears to have stabilized with improving intake and overall condition   Prior stroke with right hemiplegia Continue therapy in CIR - not yet stable enough for d/c home w/ husband   Allergies as of 12/16/2020      Reactions   Amoxicillin Nausea Only   Shellfish Allergy Diarrhea, Nausea And Vomiting   Mussels       Medication List    ASK your doctor about these medications   amLODipine 10 MG tablet Commonly known as: NORVASC Take 10 mg by mouth daily.   atorvastatin 20 MG tablet Commonly known as: LIPITOR Take 1 tablet (20 mg total) by mouth daily at 6 PM.   B-complex with vitamin C tablet Take 1 tablet by mouth daily.   CALCIUM PO Take 1 tablet by mouth daily.   cholecalciferol 1000 units tablet Commonly known as: VITAMIN D Take 1,000 Units by mouth daily. 2,000 units daily   docusate sodium 100 MG capsule Commonly known as: COLACE Take 300 mg by mouth daily.   Fish Oil 1200 MG Cpdr Take 1 capsule by mouth daily.   magnesium 30 MG tablet Take 30 mg by mouth daily.  QUEtiapine 50 MG tablet Commonly known as: SEROquel Take 1 tablet (50 mg total) by mouth 2 (two) times daily.   sertraline 50 MG tablet Commonly known as: Zoloft Take 1 tablet (50 mg total) by mouth daily.   VITAMIN E PO Take 1 tablet by mouth daily.   zinc sulfate 220 (50  Zn) MG capsule Take 220 mg by mouth daily.       Day of Discharge BP 136/78 (BP Location: Left Arm)   Pulse 99   Temp 97.8 F (36.6 C) (Oral)   Resp 19   Ht 5\' 7"  (1.702 m)   Wt 73 kg   SpO2 96%   BMI 25.21 kg/m   Physical Exam: General: No acute respiratory distress Lungs: Clear to auscultation bilaterally without wheezes or crackles Cardiovascular: Regular rate and rhythm without murmur gallop or rub normal S1 and S2 Abdomen: Nontender, nondistended, soft, bowel sounds positive, no rebound, percutaneous nephrostomy tube in place  Extremities: No significant cyanosis, clubbing, or edema bilateral lower extremities  Basic Metabolic Panel: Recent Labs  Lab 12/10/20 0559 12/11/20 0500 12/11/20 1548 12/12/20 0358 12/12/20 1000 12/13/20 0305 12/14/20 0229 12/15/20 0325  NA 133* 132* 135 135  --  137 136 134*  K 4.4 3.7 3.1* 2.9* 4.2 3.2* 3.8 4.6  CL 103 104 106 108  --  113* 112* 108  CO2 17* 17* 16* 17*  --  18* 18* 18*  GLUCOSE 177* 122* 219* 112*  --  122* 123* 158*  BUN 50* 57* 59* 52*  --  46* 45* 47*  CREATININE 3.19* 3.12* 3.01* 2.54*  --  2.24* 1.69* 1.48*  CALCIUM 8.2* 8.1* 8.1* 8.5*  --  8.7* 8.7* 8.5*  MG 1.6* 1.9 2.3 2.4  --   --  1.9 1.7  PHOS 4.2  --  3.9  --   --   --  2.0* 2.3*    Liver Function Tests: Recent Labs  Lab 12/09/20 1748 12/11/20 1548 12/15/20 0325  AST 38 28 58*  ALT 35 44 87*  ALKPHOS 128* 97 188*  BILITOT 1.1 0.8 0.7  PROT 5.4* 5.0* 5.4*  ALBUMIN 2.7* 1.9* 1.8*   CBC: Recent Labs  Lab 12/09/20 1748 12/10/20 0559 12/11/20 0934 12/12/20 0358 12/13/20 0305 12/14/20 0229 12/15/20 0325 12/16/20 0928  WBC 15.0*   < > 13.2* 13.6* 15.1* 15.6* 19.6* 14.7*  NEUTROABS 13.8*  --  12.0*  --   --   --   --   --   HGB 12.7   < > 11.7* 11.7* 11.8* 11.8* 11.5* 10.7*  HCT 38.7   < > 34.7* 35.0* 33.9* 34.4* 33.5* 31.6*  MCV 92.4   < > 89.7 89.3 85.6 85.8 85.2 88.0  PLT PLATELET CLUMPS NOTED ON SMEAR, UNABLE TO ESTIMATE   < > 26* 25*  23* 30* 53* 98*   < > = values in this interval not displayed.    CBG: Recent Labs  Lab 12/15/20 1800 12/15/20 2057 12/16/20 0745 12/16/20 1207 12/16/20 1534  GLUCAP 179* 186* 108* 127* 173*    Recent Results (from the past 240 hour(s))  Culture, blood (routine x 2)     Status: None   Collection Time: 12/09/20  9:25 PM   Specimen: BLOOD  Result Value Ref Range Status   Specimen Description BLOOD SITE NOT SPECIFIED  Final   Special Requests   Final    BOTTLES DRAWN AEROBIC AND ANAEROBIC Blood Culture results may not be optimal due to an  inadequate volume of blood received in culture bottles   Culture   Final    NO GROWTH 5 DAYS Performed at [facility], [place] [address], [place], [place] [address]    Report Status 12/14/2020 FINAL  Final  Culture, blood (routine x 2)     Status: Abnormal   Collection Time: 12/09/20  9:26 PM   Specimen: BLOOD  Result Value Ref Range Status   Specimen Description BLOOD SITE NOT SPECIFIED  Final   Special Requests   Final    BOTTLES DRAWN AEROBIC AND ANAEROBIC Blood Culture results may not be optimal due to an inadequate volume of blood received in culture bottles   Culture  Setup Time (A)  Final    GRAM VARIABLE ROD IN BOTH AEROBIC AND ANAEROBIC BOTTLES CRITICAL RESULT CALLED TO, READ BACK BY AND VERIFIED WITH: [place]. [id number] FCP Performed at [facility], [place] [address] [place], [place] [address]    Culture ESCHERICHIA COLI (A)  Final   Report Status 12/12/2020 FINAL  Final   Organism ID, Bacteria ESCHERICHIA COLI  Final      Susceptibility   Escherichia coli - MIC*    AMPICILLIN <=2 SENSITIVE Sensitive     CEFAZOLIN <=4 SENSITIVE Sensitive     CEFEPIME <=0.12 SENSITIVE Sensitive     CEFTAZIDIME <=1 SENSITIVE Sensitive     CEFTRIAXONE <=0.25 SENSITIVE Sensitive     CIPROFLOXACIN <=0.25 SENSITIVE Sensitive     GENTAMICIN <=1 SENSITIVE Sensitive     IMIPENEM <=0.25 SENSITIVE Sensitive     TRIMETH/SULFA <=20  SENSITIVE Sensitive     AMPICILLIN/SULBACTAM <=2 SENSITIVE Sensitive     PIP/TAZO <=4 SENSITIVE Sensitive     * ESCHERICHIA COLI  Blood Culture ID Panel (Reflexed)     Status: Abnormal   Collection Time: 12/09/20  9:26 PM  Result Value Ref Range Status   Enterococcus faecalis NOT DETECTED NOT DETECTED Final   Enterococcus Faecium NOT DETECTED NOT DETECTED Final   Listeria monocytogenes NOT DETECTED NOT DETECTED Final   Staphylococcus species NOT DETECTED NOT DETECTED Final   Staphylococcus aureus (BCID) NOT DETECTED NOT DETECTED Final   Staphylococcus epidermidis NOT DETECTED NOT DETECTED Final   Staphylococcus lugdunensis NOT DETECTED NOT DETECTED Final   Streptococcus species NOT DETECTED NOT DETECTED Final   Streptococcus agalactiae NOT DETECTED NOT DETECTED Final   Streptococcus pneumoniae NOT DETECTED NOT DETECTED Final   Streptococcus pyogenes NOT DETECTED NOT DETECTED Final   A.calcoaceticus-baumannii NOT DETECTED NOT DETECTED Final   Bacteroides fragilis NOT DETECTED NOT DETECTED Final   Enterobacterales DETECTED (A) NOT DETECTED Final    Comment: Enterobacterales represent a large order of gram negative bacteria, not a single organism. CRITICAL RESULT CALLED TO, READ BACK BY AND VERIFIED WITH: [place] M. [id number] FCP    Enterobacter cloacae complex NOT DETECTED NOT DETECTED Final   Escherichia coli DETECTED (A) NOT DETECTED Final    Comment: CRITICAL RESULT CALLED TO, READ BACK BY AND VERIFIED WITH: [place] M. [id number] FCP    Klebsiella aerogenes NOT DETECTED NOT DETECTED Final   Klebsiella oxytoca NOT DETECTED NOT DETECTED Final   Klebsiella pneumoniae NOT DETECTED NOT DETECTED Final   Proteus species NOT DETECTED NOT DETECTED Final   Salmonella species NOT DETECTED NOT DETECTED Final   Serratia marcescens NOT DETECTED NOT DETECTED Final   Haemophilus influenzae NOT DETECTED NOT DETECTED Final   Neisseria meningitidis NOT DETECTED NOT DETECTED Final    Pseudomonas  aeruginosa NOT DETECTED NOT DETECTED Final   Stenotrophomonas maltophilia NOT DETECTED NOT DETECTED Final   Candida albicans NOT DETECTED NOT DETECTED Final   Candida auris NOT DETECTED NOT DETECTED Final   Candida glabrata NOT DETECTED NOT DETECTED Final   Candida krusei NOT DETECTED NOT DETECTED Final   Candida parapsilosis NOT DETECTED NOT DETECTED Final   Candida tropicalis NOT DETECTED NOT DETECTED Final   Cryptococcus neoformans/gattii NOT DETECTED NOT DETECTED Final   CTX-M ESBL NOT DETECTED NOT DETECTED Final   Carbapenem resistance IMP NOT DETECTED NOT DETECTED Final   Carbapenem resistance KPC NOT DETECTED NOT DETECTED Final   Carbapenem resistance NDM NOT DETECTED NOT DETECTED Final   Carbapenem resist OXA 48 LIKE NOT DETECTED NOT DETECTED Final   Carbapenem resistance VIM NOT DETECTED NOT DETECTED Final    Comment: Performed at [facility], [address] [place], [place] [address]  Resp Panel by RT-PCR (Flu A&B, Covid) Nasopharyngeal Swab     Status: None   Collection Time: 12/09/20  9:56 PM   Specimen: Nasopharyngeal Swab; Nasopharyngeal(NP) swabs in vial transport medium  Result Value Ref Range Status   SARS Coronavirus 2 by RT PCR NEGATIVE NEGATIVE Final    Comment: (NOTE) SARS-CoV-2 target nucleic acids are NOT DETECTED.  The SARS-CoV-2 RNA is generally detectable in upper respiratory specimens during the acute phase of infection. The lowest concentration of SARS-CoV-2 viral copies this assay can detect is 138 copies/mL. A negative result does not preclude SARS-Cov-2 infection and should not be used as the sole basis for treatment or other patient management decisions. A negative result may occur with  improper specimen collection/handling, submission of specimen other than nasopharyngeal swab, presence of viral mutation(s) within the areas targeted by this assay, and inadequate number of viral copies(<138 copies/mL). A negative result must be  combined with clinical observations, patient history, and epidemiological information. The expected result is Negative.  Fact Sheet for Patients:  EntrepreneurPulse.com.au  Fact Sheet for Healthcare Providers:  IncredibleEmployment.be  This test is no t yet approved or cleared by the [place] FDA and  has been authorized for detection and/or diagnosis of SARS-CoV-2 by FDA under an Emergency Use Authorization (EUA). This EUA will remain  in effect (meaning this test can be used) for the duration of the COVID-19 declaration under Section 564(b)(1) of the Act, 21 U.S.C.section 360bbb-3(b)(1), unless the authorization is terminated  or revoked sooner.       Influenza A by PCR NEGATIVE NEGATIVE Final   Influenza B by PCR NEGATIVE NEGATIVE Final    Comment: (NOTE) The Xpert Xpress SARS-CoV-2/FLU/RSV plus assay is intended as an aid in the diagnosis of influenza from Nasopharyngeal swab specimens and should not be used as a sole basis for treatment. Nasal washings and aspirates are unacceptable for Xpert Xpress SARS-CoV-2/FLU/RSV testing.  Fact Sheet for Patients: EntrepreneurPulse.com.au  Fact Sheet for Healthcare Providers: IncredibleEmployment.be  This test is not yet approved or cleared by the [place] FDA and has been authorized for detection and/or diagnosis of SARS-CoV-2 by FDA under an Emergency Use Authorization (EUA). This EUA will remain in effect (meaning this test can be used) for the duration of the COVID-19 declaration under Section 564(b)(1) of the Act, 21 U.S.C. section 360bbb-3(b)(1), unless the authorization is terminated or revoked.  Performed at [facility], [place] [address], [place], [place] [address]   MRSA PCR Screening     Status: None   Collection Time: 12/10/20  1:11 AM  Specimen: Nasopharyngeal  Result Value Ref Range Status   MRSA by PCR NEGATIVE NEGATIVE Final     Comment:        The GeneXpert MRSA Assay (FDA approved for NASAL specimens only), is one component of a comprehensive MRSA colonization surveillance program. It is not intended to diagnose MRSA infection nor to guide or monitor treatment for MRSA infections. Performed at [facility], [place] [address], [place], [place] [address]   Culture, blood (routine x 2)     Status: None   Collection Time: 12/11/20  8:56 AM   Specimen: BLOOD LEFT HAND  Result Value Ref Range Status   Specimen Description BLOOD LEFT HAND  Final   Special Requests   Final    BOTTLES DRAWN AEROBIC ONLY Blood Culture results may not be optimal due to an inadequate volume of blood received in culture bottles   Culture   Final    NO GROWTH 5 DAYS Performed at [facility], [place] [address] [place], [place] [address]    Report Status 12/16/2020 FINAL  Final  Culture, blood (routine x 2)     Status: None   Collection Time: 12/11/20  9:03 AM   Specimen: BLOOD RIGHT HAND  Result Value Ref Range Status   Specimen Description BLOOD RIGHT HAND  Final   Special Requests   Final    BOTTLES DRAWN AEROBIC AND ANAEROBIC Blood Culture results may not be optimal due to an inadequate volume of blood received in culture bottles   Culture   Final    NO GROWTH 5 DAYS Performed at [facility], [place] [address] [place], [place] [address]    Report Status 12/16/2020 FINAL  Final  Urine Culture     Status: None   Collection Time: 12/15/20 12:53 PM   Specimen: Urine, Random  Result Value Ref Range Status   Specimen Description URINE, RANDOM  Final   Special Requests NONE  Final   Culture   Final    NO GROWTH Performed at [facility], [place] [address] [place], [place] [address]    Report Status 12/16/2020 FINAL  Final     Time spent in discharge (includes decision making & examination of pt): 30 minutes  12/16/2020, 4:39 PM   Cherene Altes, MD [facility] Office   [phone]

## 2020-12-16 NOTE — Progress Notes (Signed)
Inpatient Rehabilitation Medication Review by a Pharmacist  A complete drug regimen review was completed for this patient to identify any potential clinically significant medication issues.  Clinically significant medication issues were identified:  no  Check AMION for pharmacist assigned to patient if future medication questions/issues arise during this admission.  Pharmacist comments:   Time spent performing this drug regimen review (minutes):  15   Tim Wilhide 12/16/2020 5:23 PM

## 2020-12-16 NOTE — Progress Notes (Signed)
Patient ID: Melanie Madden, female   DOB: 10-15-1965, 55 y.o.   MRN: [id number] Met with the patient to introduce self and role of the nurse CM. Patient with aphasia; communicates through gestures known from previous admission to rehab. Reviewed skin care, drain care with patient and education to follow with her spouse and caregiver. Handouts on dietary modification, drain care and foley care left in patient education notebook. Continue to follow along to discharge to review education for home care and collaborate with the SW to facilitate preparation for discharge. Margarito Liner

## 2020-12-16 NOTE — Progress Notes (Signed)
Contacted Dr. Jackson Latino office TI:[id number] of foley. Per input cleared to remove foley. Will d/c in am and start bladder program. Nephrostomy to stay in place with documentation of output.

## 2020-12-16 NOTE — Progress Notes (Signed)
Inpatient Rehabilitation Admissions Coordinator  We have insurance approval and CIR bed to admit patient to today. I contacted pt's spouse by phone, [name], and he is in agreement for pateint to admit. I have alerted acute team, TOC and Dr. Billee Cashing Clung. I will make the arrangements to admit today.  Danne Baxter, RN, MSN Rehab Admissions Coordinator [phone] 12/16/2020 10:47 AM

## 2020-12-16 NOTE — Progress Notes (Signed)
Jamse Arn, MD  Physician  Physical Medicine and Rehabilitation  H&P     Signed  Date of Service:  12/16/2020 11:53 AM      Related encounter: ED to Hosp-Admission (Discharged) from 12/09/2020 in [place]       Signed      Expand All Collapse All     Show:Clear all [x] Manual[x] Template[] Copied  Added by: [x] Love, Ivan Anchors, PA-C[x] Jamse Arn, MD   [] Hover for details       Physical Medicine and Rehabilitation Admission H&P        Chief Complaint  Patient presents with  . Loss of Consciousness    HPI: Miagrace Oja. 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 She was treated with fluids, pressors, broad spectrum antibiotics and right nephrostomy tube placed by interventional radiology.  Blood cultures 2/4 were positive for Enterobacterales and E. Coli. Dr. Unknown Foley consulted and recommended supportive care with outpatient follow up for stone extraction in 2-3 weeks, once infection cleared. AKI resolving with downward trend in SCr but did have rise in WBCs from 19.6 on 05/19 therefore urine from urostomy cultured and showed not growth. She has had issues with intermittent hypokalemia which has been supplemented, hypoglycemia felt to be due to poor intake as well as thrombocytopenia due to sepsis. Therapy initiated and patient with resulting functional deficits with cognition, weakness, self-care. At baseline, patient had a caregiver during the day but was able to stand and take a few steps?  Patient noted to be deconditioned with decline in functional status. CIR recommended due to functional decline.  Please see  preadmission assessment from earlier today as well.   Review of Systems  Unable to perform ROS: Language        Past Medical History:  Diagnosis Date  . Allergy    . Anxiety    . Arthritis    . High triglycerides    . Kidney stones    . Migraines    . Recurrent sinus infections             Past Surgical History:  Procedure Laterality Date  . ANTERIOR CRUCIATE LIGAMENT REPAIR Left    . BUNIONECTOMY Right    . IR NEPHROSTOMY PLACEMENT RIGHT   12/09/2020  . LASIK      . LITHOTRIPSY      . MENISCUS REPAIR Left    . NASAL SEPTUM SURGERY      . SHOULDER SURGERY      . VARICOSE VEIN SURGERY Left             Family History  Problem Relation Age of Onset  . Alzheimer's disease Mother    . Hypertension Mother    . Diabetes Mother    . Thyroid disease Mother    . Deep vein thrombosis Maternal Grandmother    . Colon cancer Neg Hx    . Colon polyps Neg Hx    . Esophageal cancer Neg Hx    . Pancreatic cancer Neg Hx    . Rectal cancer Neg Hx    . Stomach cancer Neg Hx    . Breast cancer Neg Hx  Social History:  reports that she has quit smoking. She has never used smokeless tobacco. She reports current alcohol use. She reports that she does not use drugs.     Allergies  Allergen Reactions  . Amoxicillin Nausea Only  . Shellfish Allergy Diarrhea and Nausea And Vomiting      Mussels             Medications Prior to Admission  Medication Sig Dispense Refill  . amLODipine (NORVASC) 10 MG tablet Take 10 mg by mouth daily.      Marland Kitchen atorvastatin (LIPITOR) 20 MG tablet Take 1 tablet (20 mg total) by mouth daily at 6 PM. 30 tablet 1  . B Complex-C (B-COMPLEX WITH VITAMIN C) tablet Take 1 tablet by mouth daily.      Marland Kitchen CALCIUM PO Take 1 tablet by mouth daily.      . cholecalciferol (VITAMIN D) 1000 units tablet Take 1,000 Units by mouth daily. 2,000 units daily      . docusate sodium (COLACE) 100 MG capsule Take 300 mg by mouth daily.      . magnesium 30 MG tablet Take 30  mg by mouth daily.      . Omega-3 Fatty Acids (FISH OIL) 1200 MG CPDR Take 1 capsule by mouth daily.      . QUEtiapine (SEROQUEL) 50 MG tablet Take 1 tablet (50 mg total) by mouth 2 (two) times daily. 60 tablet 1  . sertraline (ZOLOFT) 50 MG tablet Take 1 tablet (50 mg total) by mouth daily. 30 tablet 1  . VITAMIN E PO Take 1 tablet by mouth daily.       Marland Kitchen zinc sulfate 220 (50 Zn) MG capsule Take 220 mg by mouth daily.          Drug Regimen Review  Drug regimen was reviewed and remains appropriate with no significant issues identified   Home: Home Living Family/patient expects to be discharged to:: Private residence Living Arrangements: Spouse/significant other Available Help at Discharge: Family,Available 24 hours/day,Personal care attendant Type of Home: House Home Access: Stairs to enter Entrance Stairs-Number of Steps: 2-3 Entrance Stairs-Rails: None Home Layout: Able to live on main level with bedroom/bathroom,Two level Alternate Level Stairs-Number of Steps: flight Alternate Level Stairs-Rails:  (uses chair lift) Bathroom Shower/Tub: Chiropodist: Standard Bathroom Accessibility: Yes Home Equipment: Tub bench,Wheelchair - manual,Cane - quad,Grab bars - toilet,Grab bars - tub/shower,Bedside commode   Functional History: Prior Function Level of Independence: Needs assistance Gait / Transfers Assistance Needed: able to amb with quad cane short distances, w/c for long distances, able to independently get in/out of chair lift and buckling self in ADL's / Homemaking Assistance Needed: supervision Comments: pt also going to outpt PT 2x/wk   Functional Status:  Mobility: Bed Mobility Overal bed mobility: Needs Assistance Bed Mobility: Supine to Sit Supine to sit: Min assist,Mod assist,+2 for physical assistance,HOB elevated General bed mobility comments: up in chair on arrival to room Transfers Overall transfer level: Needs assistance Equipment used: 2  person hand held assist Transfers: Sit to/from Stand Sit to Stand: +2 physical assistance,+2 safety/equipment,Mod assist Stand pivot transfers: Mod assist,+2 physical assistance General transfer comment: Sit<>stand performed 6x. Multimodal cues to encourage weight shift to the R LE and to correct flexed posture. Ambulation/Gait General Gait Details: unable this date   ADL: ADL Overall ADL's : Needs assistance/impaired Eating/Feeding: Set up,Sitting Eating/Feeding Details (indicate cue type and reason): able to bring cup to mouth to drin Grooming: Wash/dry face,Wash/dry hands,Set up,Sitting  Grooming Details (indicate cue type and reason): completed sitting in recliner Upper Body Bathing: Moderate assistance,Sitting Upper Body Bathing Details (indicate cue type and reason): Pt needs assist due to RUE flaccidity Lower Body Bathing: Maximal assistance,Sitting/lateral leans,Sit to/from stand Lower Body Bathing Details (indicate cue type and reason): Pt unable to complete LB bathing with out max assit for pericare and reaching both feet Upper Body Dressing : Minimal assistance,Sitting Upper Body Dressing Details (indicate cue type and reason): Min assist due to RUE hemiplegia Lower Body Dressing: Maximal assistance,Sitting/lateral leans Lower Body Dressing Details (indicate cue type and reason): Pt requires max assist to bring feet into figure 4, then has difficulty maintaining posture to complete dressing. Toilet Transfer: Maximal assistance,+2 for physical assistance,+2 for safety/equipment,Stand-pivot,BSC,RW Toilet Transfer Details (indicate cue type and reason): Pt requires max +2 to stand and due to RLE hemiplegia, pt has difficulty pivoting once in standing Toileting- Clothing Manipulation and Hygiene: Total assistance,+2 for physical assistance,Sitting/lateral lean,Sit to/from stand Toileting - Clothing Manipulation Details (indicate cue type and reason): Pt requires total assist due to  balance and RUE hemiplegia Tub/ Shower Transfer: Maximal assistance,+2 for physical assistance,+2 for safety/equipment,Stand-pivot,Tub bench Tub/Shower Transfer Details (indicate cue type and reason): Pt requires max assist +2 to stand and unable to pivot without max A +2 due to RLE hemiplegia Functional mobility during ADLs: Maximal assistance,+2 for physical assistance,+2 for safety/equipment,Rolling walker General ADL Comments: Pt requires max +2 for standing and attempting to take a step. Pt able to wiggle RLE in standing, but having difficulties taking steps at this time. Pt requiring min-max assist for all dressing and bathing, and setup to min assist for grooming.   Cognition: Cognition Overall Cognitive Status: History of cognitive impairments - at baseline Orientation Level: Other (comment) (UTA) Cognition Arousal/Alertness: Awake/alert Behavior During Therapy: WFL for tasks assessed/performed Overall Cognitive Status: History of cognitive impairments - at baseline General Comments: pt globally aphasic, had difficulty answering questions via head nodding when asked orientation questions, pt did follow all commands provide great effort. Able to point and gesture to communicate some needs.     Blood pressure 128/78, pulse 95, temperature 98.6 F (37 C), temperature source Oral, resp. rate (!) 21, height 5\' 7"  (1.702 m), weight 73 kg, SpO2 97 %. Physical Exam Vitals and nursing note reviewed.  Constitutional:      General: She is not in acute distress.    Appearance: Normal appearance. She is not ill-appearing.     Comments: Left nephrostomy drain with 200 cc clear urine some layering blood. Dry dressing right neck at prior IJ site.   HENT:     Head: Normocephalic and atraumatic.     Right Ear: External ear normal.     Left Ear: External ear normal.     Nose: Nose normal.  Eyes:     General:        Right eye: No discharge.        Left eye: No discharge.     Extraocular  Movements: Extraocular movements intact.  Cardiovascular:     Rate and Rhythm: Normal rate and regular rhythm.  Pulmonary:     Effort: Pulmonary effort is normal. No respiratory distress.     Breath sounds: No stridor.  Abdominal:     General: Abdomen is flat. Bowel sounds are normal. There is no distension.  Genitourinary:    Comments: Foley in place Musculoskeletal:     Cervical back: Normal range of motion and neck supple.     Comments:  Min edema RUE/RLE  Skin:    General: Skin is warm and dry.  Neurological:     Mental Status: She is alert.     Comments: Alert Right facial weakness Global aphasia Nonverbal Motor: Limited to participation, however moving left and and ankle No movement noted on right side  Psychiatric:     Comments: Unable to assess due to mentation        Lab Results Last 48 Hours        Results for orders placed or performed during the hospital encounter of 12/09/20 (from the past 48 hour(s))  Glucose, capillary     Status: Abnormal    Collection Time: 12/14/20 12:04 PM  Result Value Ref Range    Glucose-Capillary 178 (H) 70 - 99 mg/dL      Comment: Glucose reference range applies only to samples taken after fasting for at least 8 hours.  Glucose, capillary     Status: Abnormal    Collection Time: 12/14/20  5:07 PM  Result Value Ref Range    Glucose-Capillary 263 (H) 70 - 99 mg/dL      Comment: Glucose reference range applies only to samples taken after fasting for at least 8 hours.  Glucose, capillary     Status: Abnormal    Collection Time: 12/14/20  9:19 PM  Result Value Ref Range    Glucose-Capillary 204 (H) 70 - 99 mg/dL      Comment: Glucose reference range applies only to samples taken after fasting for at least 8 hours.  Phosphorus     Status: Abnormal    Collection Time: 12/15/20  3:25 AM  Result Value Ref Range    Phosphorus 2.3 (L) 2.5 - 4.6 mg/dL      Comment: Performed at [facility] [address] [place], [place] [address]   Comprehensive metabolic panel     Status: Abnormal    Collection Time: 12/15/20  3:25 AM  Result Value Ref Range    Sodium 134 (L) 135 - 145 mmol/L    Potassium 4.6 3.5 - 5.1 mmol/L    Chloride 108 98 - 111 mmol/L    CO2 18 (L) 22 - 32 mmol/L    Glucose, Bld 158 (H) 70 - 99 mg/dL      Comment: Glucose reference range applies only to samples taken after fasting for at least 8 hours.    BUN 47 (H) 6 - 20 mg/dL    Creatinine, Ser 1.48 (H) 0.44 - 1.00 mg/dL    Calcium 8.5 (L) 8.9 - 10.3 mg/dL    Total Protein 5.4 (L) 6.5 - 8.1 g/dL    Albumin 1.8 (L) 3.5 - 5.0 g/dL    AST 58 (H) 15 - 41 U/L    ALT 87 (H) 0 - 44 U/L    Alkaline Phosphatase 188 (H) 38 - 126 U/L    Total Bilirubin 0.7 0.3 - 1.2 mg/dL    GFR, Estimated 42 (L) >60 mL/min      Comment: (NOTE) Calculated using the CKD-EPI Creatinine Equation (2021)      Anion gap 8 5 - 15      Comment: Performed at [facility], [place] [address] [place] [address]  CBC     Status: Abnormal    Collection Time: 12/15/20  3:25 AM  Result Value Ref Range    WBC 19.6 (H) 4.0 - 10.5 K/uL    RBC 3.93 3.87 - 5.11 MIL/uL    Hemoglobin  11.5 (L) 12.0 - 15.0 g/dL    HCT 59.9 (L) 35.7 - 46.0 %    MCV 85.2 80.0 - 100.0 fL    MCH 29.3 26.0 - 34.0 pg    MCHC 34.3 30.0 - 36.0 g/dL    RDW 01.7 79.3 - 90.3 %    Platelets 53 (L) 150 - 400 K/uL      Comment: Immature Platelet Fraction may be clinically indicated, consider ordering this additional test [id number] CONSISTENT WITH PREVIOUS RESULT REPEATED TO VERIFY      nRBC 0.0 0.0 - 0.2 %      Comment: Performed at [facility], [address] [place], [place] [address]  Magnesium     Status: None    Collection Time: 12/15/20  3:25 AM  Result Value Ref Range    Magnesium 1.7 1.7 - 2.4 mg/dL      Comment: Performed at [facility], [address], [place], [place] [address]  Glucose, capillary     Status: Abnormal    Collection Time: 12/15/20  7:27 AM  Result Value Ref  Range    Glucose-Capillary 125 (H) 70 - 99 mg/dL      Comment: Glucose reference range applies only to samples taken after fasting for at least 8 hours.  Glucose, capillary     Status: Abnormal    Collection Time: 12/15/20 11:53 AM  Result Value Ref Range    Glucose-Capillary 245 (H) 70 - 99 mg/dL      Comment: Glucose reference range applies only to samples taken after fasting for at least 8 hours.  Urine Culture     Status: None    Collection Time: 12/15/20 12:53 PM    Specimen: Urine, Random  Result Value Ref Range    Specimen Description URINE, RANDOM      Special Requests NONE      Culture          NO GROWTH Performed at [facility], [address] [place], [place] [address]      Report Status 12/16/2020 FINAL    Glucose, capillary     Status: Abnormal    Collection Time: 12/15/20  6:00 PM  Result Value Ref Range    Glucose-Capillary 179 (H) 70 - 99 mg/dL      Comment: Glucose reference range applies only to samples taken after fasting for at least 8 hours.  Glucose, capillary     Status: Abnormal    Collection Time: 12/15/20  8:57 PM  Result Value Ref Range    Glucose-Capillary 186 (H) 70 - 99 mg/dL      Comment: Glucose reference range applies only to samples taken after fasting for at least 8 hours.  Glucose, capillary     Status: Abnormal    Collection Time: 12/16/20  7:45 AM  Result Value Ref Range    Glucose-Capillary 108 (H) 70 - 99 mg/dL      Comment: Glucose reference range applies only to samples taken after fasting for at least 8 hours.  CBC     Status: Abnormal    Collection Time: 12/16/20  9:28 AM  Result Value Ref Range    WBC 14.7 (H) 4.0 - 10.5 K/uL    RBC 3.59 (L) 3.87 - 5.11 MIL/uL    Hemoglobin 10.7 (L) 12.0 - 15.0 g/dL    HCT 38.9 (L) 37.3 - 46.0 %    MCV 88.0 80.0 - 100.0 fL    MCH 29.8 26.0 -  34.0 pg    MCHC 33.9 30.0 - 36.0 g/dL    RDW 14.5 11.5 - 15.5 %    Platelets 98 (L) 150 - 400 K/uL      Comment: Immature Platelet Fraction may  be clinically indicated, consider ordering this additional test [id number] CONSISTENT WITH PREVIOUS RESULT REPEATED TO VERIFY      nRBC 0.0 0.0 - 0.2 %      Comment: Performed at [facility], [place] [address], [place], [place] [address]      Imaging Results (Last 48 hours)  No results found.           Medical Problem List and Plan: 1.  Deficits with cognition, mobility, transfers, self-care debility with history of secondary to debility with history of left basal ganglia ICH with resulting global aphasia.             -patient may shower             -ELOS/Goals: 12-15 days/min a             Admit to CIR 2.  Antithrombotics: -DVT/anticoagulation:  Mechanical: Sequential compression devices, below knee Bilateral lower extremities             -antiplatelet therapy: N/A 3. 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  4. Mood: LCSW to follow for evaluation and support.              -antipsychotic agents: N/A 5. Neuropsych: This patient is not capable of making decisions on her own behalf. 6. Skin/Wound Care: Routine pressure relief measures.  7. Fluids/Electrolytes/Nutrition: Monitor I/O.              CMP ordered 8. E.coli UTI: antibiotics narrowed to IV ceftriaxone--Day #7/14 .  9. Left emphysematous pyelitis s/p nephrostomy: Continue to monitor output dily.              --Radiology following for input.              --Foley remains in place-->will follow up with urology re: timing of removal. 10. Leucocytosis:  Continues to vary. Monitor for fevers and any signs of decompensation. .               CBC ordered 11. Thrombocytopenia: Improving from 65-->25-->98             CBC ordered 12. Abnormal LFTs:              CMP ordered 13. Acute kidney injury:  Improving from 43/3.36-->47/1.48.  Continue to monitor for recovery.              CMP ordered 14. HTN:  Hypotension has resolved. Continue to hold norvasc.              Monitor increase  mobility 15. Adjustment disorder: Managed on Seroquel bid. 16. Stress induced hyperglycemia: Hgb A1C-5.2 but BS labile             --Continue to monitor with ac/hs CBG checks.             --likely elevated due to ensure between meals.    Bary Leriche, PA-C 12/16/2020  I have personally performed a face to face diagnostic evaluation, including, but not limited to relevant history and physical exam findings, of this patient and developed relevant assessment and plan.  Additionally, I have reviewed and concur with the physician assistant's documentation  above.   Delice Lesch, MD, ABPMR          Revision History                        Routing History              Note Details  Author Jamse Arn, MD File Time 12/16/2020  2:15 PM  Author Type Physician Status Signed  Last Editor Jamse Arn, MD Service Physical Medicine and [place] # [phone] [place] Date 12/16/2020

## 2020-12-16 NOTE — Progress Notes (Signed)
Patient ID: Melanie Madden, female   DOB: 10/13/1965, 55 y.o.   MRN: [id number]  INPATIENT REHABILITATION ADMISSION NOTE   Arrival Method: via bed      Mental Orientation:  Alert to self    Assessment: done see flowsheets    Skin: intact with some bruising and nephrostomy site    IV'S:  2 IV's Saline locked    Pain: 2 on the faces scale    Tubes and Drains: nephrostomy tube, Floey catheter    Safety Measures: side rails up    Vital Signs: done    Height and Weight: 5'7"  70.2 kg    Rehab Orientation: completed    Family: husband notified of admission    Notes:

## 2020-12-16 NOTE — H&P (Signed)
Physical Medicine and Rehabilitation Admission H&P    Chief Complaint  Patient presents with  . Loss of Consciousness   HPI: Melanie Madden. 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 She was treated with fluids, pressors, broad spectrum antibiotics and right nephrostomy tube placed by interventional radiology.  Blood cultures 2/4 were positive for Enterobacterales and E. Coli. Dr. Unknown Foley consulted and recommended supportive care with outpatient follow up for stone extraction in 2-3 weeks, once infection cleared. AKI resolving with downward trend in SCr but did have rise in WBCs from 19.6 on 05/19 therefore urine from urostomy cultured and showed not growth. She has had issues with intermittent hypokalemia which has been supplemented, hypoglycemia felt to be due to poor intake as well as thrombocytopenia due to sepsis. Therapy initiated and patient with resulting functional deficits with cognition, weakness, self-care. At baseline, patient had a caregiver during the day but was able to stand and take a few steps?  Patient noted to be deconditioned with decline in functional status. CIR recommended due to functional decline.  Please see preadmission assessment from earlier today as well.  Review of Systems  Unable to perform ROS: Language   Past Medical History:  Diagnosis Date  . Allergy   . Anxiety   . Arthritis   . High triglycerides   . Kidney stones   . Migraines   . Recurrent sinus infections     Past Surgical History:  Procedure Laterality Date  . ANTERIOR CRUCIATE LIGAMENT REPAIR Left   . BUNIONECTOMY Right   . IR NEPHROSTOMY PLACEMENT RIGHT  12/09/2020  .  LASIK    . LITHOTRIPSY    . MENISCUS REPAIR Left   . NASAL SEPTUM SURGERY    . SHOULDER SURGERY    . VARICOSE VEIN SURGERY Left     Family History  Problem Relation Age of Onset  . Alzheimer's disease Mother   . Hypertension Mother   . Diabetes Mother   . Thyroid disease Mother   . Deep vein thrombosis Maternal Grandmother   . Colon cancer Neg Hx   . Colon polyps Neg Hx   . Esophageal cancer Neg Hx   . Pancreatic cancer Neg Hx   . Rectal cancer Neg Hx   . Stomach cancer Neg Hx   . Breast cancer Neg Hx     Social History:  reports that she has quit smoking. She has never used smokeless tobacco. She reports current alcohol use. She reports that she does not use drugs.   Allergies  Allergen Reactions  . Amoxicillin Nausea Only  . Shellfish Allergy Diarrhea and Nausea And Vomiting    Mussels     Medications Prior to Admission  Medication Sig Dispense Refill  . amLODipine (NORVASC) 10 MG tablet Take 10 mg by mouth daily.    Marland Kitchen atorvastatin (LIPITOR) 20 MG tablet Take 1 tablet (20 mg total) by mouth daily at 6 PM. 30 tablet 1  . B Complex-C (B-COMPLEX WITH VITAMIN C) tablet Take 1 tablet by mouth daily.    Marland Kitchen CALCIUM PO Take 1 tablet by mouth daily.    . cholecalciferol (VITAMIN D) 1000 units tablet Take 1,000 Units by mouth daily.  2,000 units daily    . docusate sodium (COLACE) 100 MG capsule Take 300 mg by mouth daily.    . magnesium 30 MG tablet Take 30 mg by mouth daily.    . Omega-3 Fatty Acids (FISH OIL) 1200 MG CPDR Take 1 capsule by mouth daily.    . QUEtiapine (SEROQUEL) 50 MG tablet Take 1 tablet (50 mg total) by mouth 2 (two) times daily. 60 tablet 1  . sertraline (ZOLOFT) 50 MG tablet Take 1 tablet (50 mg total) by mouth daily. 30 tablet 1  . VITAMIN E PO Take 1 tablet by mouth daily.     Marland Kitchen zinc sulfate 220 (50 Zn) MG capsule Take 220 mg by mouth daily.      Drug Regimen Review  Drug regimen was reviewed and remains appropriate with no significant issues  identified  Home: Home Living Family/patient expects to be discharged to:: Private residence Living Arrangements: Spouse/significant other Available Help at Discharge: Family,Available 24 hours/day,Personal care attendant Type of Home: House Home Access: Stairs to enter Entrance Stairs-Number of Steps: 2-3 Entrance Stairs-Rails: None Home Layout: Able to live on main level with bedroom/bathroom,Two level Alternate Level Stairs-Number of Steps: flight Alternate Level Stairs-Rails:  (uses chair lift) Bathroom Shower/Tub: Chiropodist: Standard Bathroom Accessibility: Yes Home Equipment: Tub bench,Wheelchair - manual,Cane - quad,Grab bars - toilet,Grab bars - tub/shower,Bedside commode   Functional History: Prior Function Level of Independence: Needs assistance Gait / Transfers Assistance Needed: able to amb with quad cane short distances, w/c for long distances, able to independently get in/out of chair lift and buckling self in ADL's / Homemaking Assistance Needed: supervision Comments: pt also going to outpt PT 2x/wk  Functional Status:  Mobility: Bed Mobility Overal bed mobility: Needs Assistance Bed Mobility: Supine to Sit Supine to sit: Min assist,Mod assist,+2 for physical assistance,HOB elevated General bed mobility comments: up in chair on arrival to room Transfers Overall transfer level: Needs assistance Equipment used: 2 person hand held assist Transfers: Sit to/from Stand Sit to Stand: +2 physical assistance,+2 safety/equipment,Mod assist Stand pivot transfers: Mod assist,+2 physical assistance General transfer comment: Sit<>stand performed 6x. Multimodal cues to encourage weight shift to the R LE and to correct flexed posture. Ambulation/Gait General Gait Details: unable this date    ADL: ADL Overall ADL's : Needs assistance/impaired Eating/Feeding: Set up,Sitting Eating/Feeding Details (indicate cue type and reason): able to bring cup to  mouth to drin Grooming: Wash/dry face,Wash/dry hands,Set up,Sitting Grooming Details (indicate cue type and reason): completed sitting in recliner Upper Body Bathing: Moderate assistance,Sitting Upper Body Bathing Details (indicate cue type and reason): Pt needs assist due to RUE flaccidity Lower Body Bathing: Maximal assistance,Sitting/lateral leans,Sit to/from stand Lower Body Bathing Details (indicate cue type and reason): Pt unable to complete LB bathing with out max assit for pericare and reaching both feet Upper Body Dressing : Minimal assistance,Sitting Upper Body Dressing Details (indicate cue type and reason): Min assist due to RUE hemiplegia Lower Body Dressing: Maximal assistance,Sitting/lateral leans Lower Body Dressing Details (indicate cue type and reason): Pt requires max assist to bring feet into figure 4, then has difficulty maintaining posture to complete dressing. Toilet Transfer: Maximal assistance,+2 for physical assistance,+2 for safety/equipment,Stand-pivot,BSC,RW Toilet Transfer Details (indicate cue type and reason): Pt requires max +2 to stand and due to RLE hemiplegia, pt has difficulty pivoting once in standing Toileting- Clothing Manipulation and Hygiene: Total assistance,+2 for physical assistance,Sitting/lateral lean,Sit to/from stand Toileting - Clothing Manipulation Details (indicate cue type and reason): Pt  requires total assist due to balance and RUE hemiplegia Tub/ Shower Transfer: Maximal assistance,+2 for physical assistance,+2 for safety/equipment,Stand-pivot,Tub bench Tub/Shower Transfer Details (indicate cue type and reason): Pt requires max assist +2 to stand and unable to pivot without max A +2 due to RLE hemiplegia Functional mobility during ADLs: Maximal assistance,+2 for physical assistance,+2 for safety/equipment,Rolling walker General ADL Comments: Pt requires max +2 for standing and attempting to take a step. Pt able to wiggle RLE in standing, but  having difficulties taking steps at this time. Pt requiring min-max assist for all dressing and bathing, and setup to min assist for grooming.  Cognition: Cognition Overall Cognitive Status: History of cognitive impairments - at baseline Orientation Level: Other (comment) (UTA) Cognition Arousal/Alertness: Awake/alert Behavior During Therapy: WFL for tasks assessed/performed Overall Cognitive Status: History of cognitive impairments - at baseline General Comments: pt globally aphasic, had difficulty answering questions via head nodding when asked orientation questions, pt did follow all commands provide great effort. Able to point and gesture to communicate some needs.   Blood pressure 128/78, pulse 95, temperature 98.6 F (37 C), temperature source Oral, resp. rate (!) 21, height 5\' 7"  (1.702 m), weight 73 kg, SpO2 97 %. Physical Exam Vitals and nursing note reviewed.  Constitutional:      General: She is not in acute distress.    Appearance: Normal appearance. She is not ill-appearing.     Comments: Left nephrostomy drain with 200 cc clear urine some layering blood. Dry dressing right neck at prior IJ site.   HENT:     Head: Normocephalic and atraumatic.     Right Ear: External ear normal.     Left Ear: External ear normal.     Nose: Nose normal.  Eyes:     General:        Right eye: No discharge.        Left eye: No discharge.     Extraocular Movements: Extraocular movements intact.  Cardiovascular:     Rate and Rhythm: Normal rate and regular rhythm.  Pulmonary:     Effort: Pulmonary effort is normal. No respiratory distress.     Breath sounds: No stridor.  Abdominal:     General: Abdomen is flat. Bowel sounds are normal. There is no distension.  Genitourinary:    Comments: Foley in place Musculoskeletal:     Cervical back: Normal range of motion and neck supple.     Comments: Min edema RUE/RLE  Skin:    General: Skin is warm and dry.  Neurological:     Mental  Status: She is alert.     Comments: Alert Right facial weakness Global aphasia Nonverbal Motor: Limited to participation, however moving left and and ankle No movement noted on right side  Psychiatric:     Comments: Unable to assess due to mentation     Results for orders placed or performed during the hospital encounter of 12/09/20 (from the past 48 hour(s))  Glucose, capillary     Status: Abnormal   Collection Time: 12/14/20 12:04 PM  Result Value Ref Range   Glucose-Capillary 178 (H) 70 - 99 mg/dL    Comment: Glucose reference range applies only to samples taken after fasting for at least 8 hours.  Glucose, capillary     Status: Abnormal   Collection Time: 12/14/20  5:07 PM  Result Value Ref Range   Glucose-Capillary 263 (H) 70 - 99 mg/dL    Comment: Glucose reference range applies only to samples taken after fasting  for at least 8 hours.  Glucose, capillary     Status: Abnormal   Collection Time: 12/14/20  9:19 PM  Result Value Ref Range   Glucose-Capillary 204 (H) 70 - 99 mg/dL    Comment: Glucose reference range applies only to samples taken after fasting for at least 8 hours.  Phosphorus     Status: Abnormal   Collection Time: 12/15/20  3:25 AM  Result Value Ref Range   Phosphorus 2.3 (L) 2.5 - 4.6 mg/dL    Comment: Performed at [facility] [address], [place], [place] [address]  Comprehensive metabolic panel     Status: Abnormal   Collection Time: 12/15/20  3:25 AM  Result Value Ref Range   Sodium 134 (L) 135 - 145 mmol/L   Potassium 4.6 3.5 - 5.1 mmol/L   Chloride 108 98 - 111 mmol/L   CO2 18 (L) 22 - 32 mmol/L   Glucose, Bld 158 (H) 70 - 99 mg/dL    Comment: Glucose reference range applies only to samples taken after fasting for at least 8 hours.   BUN 47 (H) 6 - 20 mg/dL   Creatinine, Ser 1.48 (H) 0.44 - 1.00 mg/dL   Calcium 8.5 (L) 8.9 - 10.3 mg/dL   Total Protein 5.4 (L) 6.5 - 8.1 g/dL   Albumin 1.8 (L) 3.5 - 5.0 g/dL   AST 58 (H) 15 - 41 U/L    ALT 87 (H) 0 - 44 U/L   Alkaline Phosphatase 188 (H) 38 - 126 U/L   Total Bilirubin 0.7 0.3 - 1.2 mg/dL   GFR, Estimated 42 (L) >60 mL/min    Comment: (NOTE) Calculated using the CKD-EPI Creatinine Equation (2021)    Anion gap 8 5 - 15    Comment: Performed at [facility], [place] [address] [place], [place] [address]  CBC     Status: Abnormal   Collection Time: 12/15/20  3:25 AM  Result Value Ref Range   WBC 19.6 (H) 4.0 - 10.5 K/uL   RBC 3.93 3.87 - 5.11 MIL/uL   Hemoglobin 11.5 (L) 12.0 - 15.0 g/dL   HCT 33.5 (L) 36.0 - 46.0 %   MCV 85.2 80.0 - 100.0 fL   MCH 29.3 26.0 - 34.0 pg   MCHC 34.3 30.0 - 36.0 g/dL   RDW 14.4 11.5 - 15.5 %   Platelets 53 (L) 150 - 400 K/uL    Comment: Immature Platelet Fraction may be clinically indicated, consider ordering this additional test [id number] CONSISTENT WITH PREVIOUS RESULT REPEATED TO VERIFY    nRBC 0.0 0.0 - 0.2 %    Comment: Performed at [facility], [place] [address], [place], [place] [address]  Magnesium     Status: None   Collection Time: 12/15/20  3:25 AM  Result Value Ref Range   Magnesium 1.7 1.7 - 2.4 mg/dL    Comment: Performed at [facility] [address] [place], [place] [address]  Glucose, capillary     Status: Abnormal   Collection Time: 12/15/20  7:27 AM  Result Value Ref Range   Glucose-Capillary 125 (H) 70 - 99 mg/dL    Comment: Glucose reference range applies only to samples taken after fasting for at least 8 hours.  Glucose, capillary     Status: Abnormal   Collection Time: 12/15/20 11:53 AM  Result Value Ref Range   Glucose-Capillary 245 (H) 70 - 99 mg/dL    Comment: Glucose reference range applies  only to samples taken after fasting for at least 8 hours.  Urine Culture     Status: None   Collection Time: 12/15/20 12:53 PM   Specimen: Urine, Random  Result Value Ref Range   Specimen Description URINE, RANDOM    Special Requests NONE    Culture      NO GROWTH Performed at [facility], [place] [address] [place], [place] [address]    Report Status 12/16/2020 FINAL   Glucose, capillary     Status: Abnormal   Collection Time: 12/15/20  6:00 PM  Result Value Ref Range   Glucose-Capillary 179 (H) 70 - 99 mg/dL    Comment: Glucose reference range applies only to samples taken after fasting for at least 8 hours.  Glucose, capillary     Status: Abnormal   Collection Time: 12/15/20  8:57 PM  Result Value Ref Range   Glucose-Capillary 186 (H) 70 - 99 mg/dL    Comment: Glucose reference range applies only to samples taken after fasting for at least 8 hours.  Glucose, capillary     Status: Abnormal   Collection Time: 12/16/20  7:45 AM  Result Value Ref Range   Glucose-Capillary 108 (H) 70 - 99 mg/dL    Comment: Glucose reference range applies only to samples taken after fasting for at least 8 hours.  CBC     Status: Abnormal   Collection Time: 12/16/20  9:28 AM  Result Value Ref Range   WBC 14.7 (H) 4.0 - 10.5 K/uL   RBC 3.59 (L) 3.87 - 5.11 MIL/uL   Hemoglobin 10.7 (L) 12.0 - 15.0 g/dL   HCT 31.6 (L) 36.0 - 46.0 %   MCV 88.0 80.0 - 100.0 fL   MCH 29.8 26.0 - 34.0 pg   MCHC 33.9 30.0 - 36.0 g/dL   RDW 14.5 11.5 - 15.5 %   Platelets 98 (L) 150 - 400 K/uL    Comment: Immature Platelet Fraction may be clinically indicated, consider ordering this additional test [id number] CONSISTENT WITH PREVIOUS RESULT REPEATED TO VERIFY    nRBC 0.0 0.0 - 0.2 %    Comment: Performed at [facility], [place] [address], [place],  [address]   No results found.     Medical Problem List and Plan: 1.  Deficits with cognition, mobility, transfers, self-care secondary to debility with history of left basal ganglia ICH with resulting global aphasia.  -patient may shower  -ELOS/Goals: 12-15 days/min a  Admit to CIR 2.  Antithrombotics: -DVT/anticoagulation:  Mechanical: Sequential compression devices, below knee Bilateral lower extremities  -antiplatelet therapy: N/A 3.  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  4. Mood: LCSW to follow for evaluation and support.   -antipsychotic agents: N/A 5. Neuropsych: This patient is not capable of making decisions on her own behalf. 6. Skin/Wound Care: Routine pressure relief measures.  7. Fluids/Electrolytes/Nutrition: Monitor I/O.   CMP ordered 8. E.coli UTI: antibiotics narrowed to IV ceftriaxone--Day #7/14 .  9. Left emphysematous pyelitis s/p nephrostomy: Continue to monitor output dily.   --Radiology following for input.   --Foley remains in place-->will follow up with urology re: timing of removal. 10. Leucocytosis:  Continues to vary. Monitor for fevers and any signs of decompensation. .    CBC ordered 11. Thrombocytopenia: Improving from 65-->25-->98  CBC ordered 12. Abnormal LFTs:   CMP ordered 13. Acute kidney injury:  Improving from 43/3.36-->47/1.48.  Continue to monitor for recovery.   CMP ordered  14. HTN:  Hypotension has resolved. Continue to hold norvasc.   Monitor increase mobility 15. Adjustment disorder: Managed on Seroquel bid. 16. Stress induced hyperglycemia: Hgb A1C-5.2 but BS labile  --Continue to monitor with ac/hs CBG checks.  --likely elevated due to ensure between meals.   Bary Leriche, PA-C 12/16/2020  I have personally performed a face to face diagnostic evaluation, including, but not limited to relevant history and physical exam findings, of this patient and developed relevant assessment and plan.  Additionally, I have reviewed and concur with the physician assistant's documentation above.  Delice Lesch, MD, ABPMR  The patient's status has not changed. Any changes from the pre-admission screening or documentation from the acute chart are noted above.   Delice Lesch, MD, ABPMR

## 2020-12-17 DIAGNOSIS — I10 Essential (primary) hypertension: Secondary | ICD-10-CM | POA: Diagnosis not present

## 2020-12-17 DIAGNOSIS — R5381 Other malaise: Secondary | ICD-10-CM | POA: Diagnosis not present

## 2020-12-17 DIAGNOSIS — D72829 Elevated white blood cell count, unspecified: Secondary | ICD-10-CM

## 2020-12-17 DIAGNOSIS — E871 Hypo-osmolality and hyponatremia: Secondary | ICD-10-CM | POA: Diagnosis not present

## 2020-12-17 DIAGNOSIS — N179 Acute kidney failure, unspecified: Secondary | ICD-10-CM

## 2020-12-17 DIAGNOSIS — D696 Thrombocytopenia, unspecified: Secondary | ICD-10-CM

## 2020-12-17 DIAGNOSIS — R7401 Elevation of levels of liver transaminase levels: Secondary | ICD-10-CM

## 2020-12-17 LAB — CBC WITH DIFFERENTIAL/PLATELET
Abs Immature Granulocytes: 0.1 10*3/uL — ABNORMAL HIGH (ref 0.00–0.07)
Basophils Absolute: 0 10*3/uL (ref 0.0–0.1)
Basophils Relative: 0 %
Eosinophils Absolute: 0.1 10*3/uL (ref 0.0–0.5)
Eosinophils Relative: 1 %
HCT: 31.8 % — ABNORMAL LOW (ref 36.0–46.0)
Hemoglobin: 10.8 g/dL — ABNORMAL LOW (ref 12.0–15.0)
Lymphocytes Relative: 5 %
Lymphs Abs: 0.7 10*3/uL (ref 0.7–4.0)
MCH: 29.6 pg (ref 26.0–34.0)
MCHC: 34 g/dL (ref 30.0–36.0)
MCV: 87.1 fL (ref 80.0–100.0)
Metamyelocytes Relative: 1 %
Monocytes Absolute: 0.1 10*3/uL (ref 0.1–1.0)
Monocytes Relative: 1 %
Neutro Abs: 12.9 10*3/uL — ABNORMAL HIGH (ref 1.7–7.7)
Neutrophils Relative %: 92 %
Platelets: 151 10*3/uL (ref 150–400)
RBC: 3.65 MIL/uL — ABNORMAL LOW (ref 3.87–5.11)
RDW: 14.3 % (ref 11.5–15.5)
WBC: 14 10*3/uL — ABNORMAL HIGH (ref 4.0–10.5)
nRBC: 0 % (ref 0.0–0.2)
nRBC: 0 /100 WBC

## 2020-12-17 LAB — COMPREHENSIVE METABOLIC PANEL
ALT: 92 U/L — ABNORMAL HIGH (ref 0–44)
AST: 40 U/L (ref 15–41)
Albumin: 2 g/dL — ABNORMAL LOW (ref 3.5–5.0)
Alkaline Phosphatase: 151 U/L — ABNORMAL HIGH (ref 38–126)
Anion gap: 10 (ref 5–15)
BUN: 34 mg/dL — ABNORMAL HIGH (ref 6–20)
CO2: 21 mmol/L — ABNORMAL LOW (ref 22–32)
Calcium: 9 mg/dL (ref 8.9–10.3)
Chloride: 102 mmol/L (ref 98–111)
Creatinine, Ser: 1 mg/dL (ref 0.44–1.00)
GFR, Estimated: >60 mL/min (ref >60)
Glucose, Bld: 122 mg/dL — ABNORMAL HIGH (ref 70–99)
Potassium: 4.8 mmol/L (ref 3.5–5.1)
Sodium: 133 mmol/L — ABNORMAL LOW (ref 135–145)
Total Bilirubin: 0.1 mg/dL — ABNORMAL LOW (ref 0.3–1.2)
Total Protein: 5.4 g/dL — ABNORMAL LOW (ref 6.5–8.1)

## 2020-12-17 LAB — GLUCOSE, CAPILLARY
Glucose-Capillary: 110 mg/dL — ABNORMAL HIGH (ref 70–99)
Glucose-Capillary: 176 mg/dL — ABNORMAL HIGH (ref 70–99)
Glucose-Capillary: 187 mg/dL — ABNORMAL HIGH (ref 70–99)
Glucose-Capillary: 173 mg/dL — ABNORMAL HIGH (ref 70–99)

## 2020-12-17 MED ORDER — CEPHALEXIN 250 MG PO CAPS
500.0000 mg | ORAL_CAPSULE | Freq: Four times a day (QID) | ORAL | Status: AC
  Administered 2020-12-17 – 2020-12-23 (×25): 500 mg via ORAL
  Filled 2020-12-17 (×24): qty 2

## 2020-12-17 NOTE — Progress Notes (Signed)
[facility] PHYSICAL MEDICINE & REHABILITATION PROGRESS NOTE  Subjective/Complaints: Patient seen laying in bed this morning.  No reported issues overnight.  ROS: Denies CP, SOB, N/V/D  Objective: Vital Signs: Blood pressure 139/77, pulse 90, temperature 98.5 F (36.9 C), temperature source Oral, resp. rate 17, weight 68.2 kg, SpO2 98 %. No results found. Recent Labs    12/16/20 0928 12/17/20 0503  WBC 14.7* 14.0*  HGB 10.7* 10.8*  HCT 31.6* 31.8*  PLT 98* 151   Recent Labs    12/15/20 0325 12/17/20 0503  NA 134* 133*  K 4.6 4.8  CL 108 102  CO2 18* 21*  GLUCOSE 158* 122*  BUN 47* 34*  CREATININE 1.48* 1.00  CALCIUM 8.5* 9.0    Intake/Output Summary (Last 24 hours) at 12/17/2020 1246 Last data filed at 12/17/2020 1101 Gross per 24 hour  Intake 661.71 ml  Output 3350 ml  Net -2688.29 ml        Physical Exam: BP 139/77 (BP Location: Left Arm)   Pulse 90   Temp 98.5 F (36.9 C) (Oral)   Resp 17   Wt 68.2 kg   SpO2 98%   BMI 23.55 kg/m  Constitutional: No distress . Vital signs reviewed. HENT: Normocephalic.  Atraumatic. Eyes: EOMI. No discharge. Cardiovascular: No JVD.  RRR. Respiratory: Normal effort.  No stridor.  Bilateral clear to auscultation. GI: Non-distended.  BS +. GU: + Nephrostomy drain Skin: Warm and dry.  Intact. Psych: Unable to assess due to mentation Musc: RUE/RLE: With mild edema.  No tenderness in extremities. Neuro: Alert Global aphasia Nonverbal Motor: Limited to participation   Assessment/Plan: 1. Functional deficits which require 3+ hours per day of interdisciplinary therapy in a comprehensive inpatient rehab setting.  Physiatrist is providing close team supervision and 24 hour management of active medical problems listed below.  Physiatrist and rehab team continue to assess barriers to discharge/monitor patient progress toward functional and medical goals   Care Tool:  Bathing    Body parts bathed by patient: Right  arm,Chest,Abdomen,Front perineal area,Right upper leg,Left upper leg,Left lower leg,Face   Body parts bathed by helper: Left arm,Buttocks,Right lower leg     Bathing assist Assist Level: Maximal Assistance - Patient 24 - 49%     Upper Body Dressing/Undressing Upper body dressing   What is the patient wearing?: Pull over shirt    Upper body assist Assist Level: Minimal Assistance - Patient > 75%    Lower Body Dressing/Undressing Lower body dressing      What is the patient wearing?: Pants     Lower body assist Assist for lower body dressing: Maximal Assistance - Patient 25 - 49%     Toileting Toileting    Toileting assist Assist for toileting: Maximal Assistance - Patient 25 - 49%     Transfers Chair/bed transfer  Transfers assist     Chair/bed transfer assist level: Maximal Assistance - Patient 25 - 49%     Locomotion Ambulation   Ambulation assist              Walk 10 feet activity   Assist           Walk 50 feet activity   Assist           Walk 150 feet activity   Assist           Walk 10 feet on uneven surface  activity   Assist           Wheelchair  Assist               Wheelchair 50 feet with 2 turns activity    Assist            Wheelchair 150 feet activity     Assist           Medical Problem List and Plan: 1.  Deficits with cognition, mobility, transfers, self-care secondary to debility with history of left basal ganglia ICH with resulting global aphasia.  Begin CIR evaluations 2.  Antithrombotics: -DVT/anticoagulation:  Mechanical: Sequential compression devices, below knee Bilateral lower extremities             -antiplatelet therapy: N/A 3. 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   Monitor with increased exertion 4. Mood: LCSW to follow for evaluation and support.              -antipsychotic agents: N/A 5. Neuropsych: This  patient is not capable of making decisions on her own behalf. 6. Skin/Wound Care: Routine pressure relief measures.  7. Fluids/Electrolytes/Nutrition: Monitor I/Os.  8. E.coli UTI: antibiotics narrowed to IV ceftriaxone--Day #8/14 .  9. Left emphysematous pyelitis s/p nephrostomy: Continue to monitor output dily.              --Radiology following for input.              Per urology, okay to remove Foley  10. Leucocytosis:  .               WBCs 14.0 on 5/21  Afebrile  Monitor for signs and symptoms of infection 11. Thrombocytopenia:   Platelets 151 on 5/21, improving  12. Abnormal LFTs:              LFTs elevated on 5/21  Continue to monitor 13. Acute kidney injury:    Creatinine 1.0 on 5/21  Continue to monitor 14. HTN:  Hypotension has resolved. Continue to hold norvasc.              Monitor his increase mobility 15. Adjustment disorder: Managed on Seroquel bid. 16. Stress induced hyperglycemia: Hgb A1C-5.2 but BS labile             --Continue to monitor with ac/hs CBG checks.            monitor with increased mobility 17.  Hyponatremia  Sodium 132 on 5/21  Continue to monitor  LOS: 1 days A FACE TO FACE EVALUATION WAS PERFORMED  Vihana Kydd Lorie Phenix 12/17/2020, 12:46 PM

## 2020-12-17 NOTE — Evaluation (Signed)
Speech Language Pathology Assessment and Plan  Patient Details  Name: Melanie Madden MRN: [id number] Date of Birth: [birth year]  SLP Diagnosis: Aphasia;Cognitive Impairments;Apraxia  Rehab Potential: Fair ELOS: 12-14 days    Today's Date: 12/17/2020 SLP Individual Time: 8527-7824 SLP Individual Time Calculation (min): 60 min   Hospital Problem: Principal Problem:   Debility Active Problems:   Hyponatremia  Past Medical History:  Past Medical History:  Diagnosis Date  . Allergy   . Anxiety   . Arthritis   . High triglycerides   . Kidney stones   . Migraines   . Recurrent sinus infections    Past Surgical History:  Past Surgical History:  Procedure Laterality Date  . ANTERIOR CRUCIATE LIGAMENT REPAIR Left   . BUNIONECTOMY Right   . IR NEPHROSTOMY PLACEMENT RIGHT  12/09/2020  . LASIK    . LITHOTRIPSY    . MENISCUS REPAIR Left   . NASAL SEPTUM SURGERY    . SHOULDER SURGERY    . 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 She was treated with fluids, pressors, broad spectrum antibiotics and right nephrostomy tube placed by interventional radiology.  Blood cultures 2/4 were positive for Enterobacterales and E. Coli. Dr. Unknown Foley consulted and recommended supportive care with outpatient follow up for stone extraction in 2-3 weeks, once infection cleared. AKI resolving with downward trend in SCr but did have rise in WBCs from 19.6 on 05/19 therefore urine from urostomy cultured and showed not growth. She has had issues with intermittent hypokalemia which has  been supplemented, hypoglycemia felt to be due to poor intake as well as thrombocytopenia due to sepsis. Therapy initiated and patient with resulting functional deficits with cognition, weakness, self-care. Patient admitted to [facility] 12/16/20.  Patient observed with breakfast meal of regular textures with thin liquids. Patient consumed meal without overt s/s of aspiration but required intermittent verbal cues for use of small bites/sips and to self-monitor and correct right anterior spillage. Recommend patient continue regular textures with thin liquids with set-up assist and intermittent supervision. Patient initially demonstrated decreased initiation of functional communication but eventually utilized gestures and grunts for communication of wants/needs. Patient attempted to verbally communicate but it was essentially unintelligible due to apraxia with groping. SLP attempted to utilize a letter board for functional communication. Patient could spell her name but was unable to spell any other functional word and became perseverative with task. However, patient able to answer questions in regards to biographical and environmental information with 100% accuracy when given written words from a field of 3. Patient's family was not present to confirm baseline level of functional communication prior to admission, however, patient was still in outpatient therapy and focusing on imitating individual phonemes per notes. Impulsivity and decreased problem solving and attention were also noted throughout evaluation with functional tasks. Patient would benefit from skilled SLP intervention to maximize her cognitive functioning and overall functional communication with use of multimodal communication aids.     Skilled Therapeutic Interventions          Administered a cognitive-linguistic evaluation and BSE, please see above for details.   SLP Assessment  Patient will need skilled Speech Lanaguage Pathology Services during CIR  admission    Recommendations  SLP Diet Recommendations: Age appropriate regular solids;Thin Liquid Administration via: Cup;Straw Medication Administration: Crushed with puree Supervision: Patient able to self feed (set-up assist) Compensations: Minimize environmental distractions;Slow rate;Small sips/bites;Monitor for anterior loss Postural Changes and/or Swallow Maneuvers: Seated upright 90 degrees Oral Care Recommendations: Oral care BID Patient destination: Home Follow up Recommendations: Outpatient SLP;24 hour supervision/assistance Equipment Recommended: None recommended by SLP    SLP Frequency 3 to 5 out of 7 days   SLP Duration  SLP Intensity  SLP Treatment/Interventions 12-14 days  Minumum of 1-2 x/day, 30 to 90 minutes  Cognitive remediation/compensation;Internal/external aids;Speech/Language facilitation;Cueing hierarchy;Environmental controls;Therapeutic Activities;Functional tasks;Patient/family education    Pain Pain Assessment Pain Scale: 0-10 Pain Score: 0-No pain Faces Pain Scale: No hurt  Prior Functioning Type of Home: House  Lives With: Spouse Available Help at Discharge: Family;Available 24 hours/day;Personal care attendant  SLP Evaluation Cognition Overall Cognitive Status: History of cognitive impairments - at baseline Arousal/Alertness: Awake/alert Orientation Level: Oriented to person;Oriented to place (via written aid) Attention: Sustained Sustained Attention: Impaired Sustained Attention Impairment: Functional basic Memory: Impaired Awareness: Impaired Awareness Impairment: Emergent impairment Problem Solving: Impaired Problem Solving Impairment: Functional basic Behaviors: Impulsive Safety/Judgment: Impaired  Comprehension Auditory Comprehension Overall Auditory Comprehension: Impaired Yes/No Questions: Impaired Commands: Impaired Conversation: Simple Interfering Components: Motor planning;Processing speed;Working  memory EffectiveTechniques: Extra processing time;Repetition;Slowed speech;Stressing words;Visual/Gestural cues Reading Comprehension Reading Status: Impaired Expression Expression Primary Mode of Expression: Nonverbal - gestures Verbal Expression Overall Verbal Expression: Impaired Initiation: Impaired Repetition: Impaired Naming: Impairment Interfering Components:  (apraxia) Written Expression Dominant Hand: Right Written Expression: Unable to assess (comment) Oral Motor Oral Motor/Sensory Function Overall Oral Motor/Sensory Function: Mild impairment Motor Speech Overall Motor Speech: Impaired Respiration: Within functional limits Phonation: Normal Articulation: Impaired Motor Planning: Impaired Level of Impairment: Word Motor Speech Errors: Unaware;Groping for words;Inconsistent  Care Tool Care Tool Cognition Expression of Ideas and Wants Expression of Ideas and Wants: Rarely/Never expressess or very difficult - rarely/never expresses self or speech is very difficult to understand   Understanding Verbal and Non-Verbal Content Understanding Verbal and Non-Verbal Content: Sometimes understands - understands only basic conversations or simple, direct phrases. Frequently requires cues to understand   Memory/Recall Ability *first 3 days only Memory/Recall Ability *first 3 days only: That he or she is in a hospital/hospital unit      Bedside Swallowing Assessment General Previous Swallow Assessment: N/A Diet Prior to this Study: Thin liquids;Regular Temperature Spikes Noted: No Respiratory Status: Room air History of Recent Intubation: No Behavior/Cognition: Alert;Cooperative Oral Cavity - Dentition: Adequate natural dentition Self-Feeding Abilities: Able to feed self;Needs set up Patient Positioning: Upright in bed Baseline Vocal Quality: Normal Volitional Cough: Cognitively unable to elicit Volitional Swallow: Unable to elicit  Ice Chips Ice chips: Not  tested Thin Liquid Thin Liquid: Impaired Presentation: Self Fed;Cup;Straw Oral Phase Impairments: Reduced labial seal Oral Phase Functional Implications: Right anterior spillage Nectar Thick Nectar Thick Liquid: Not tested Honey Thick Honey Thick Liquid: Not tested Puree Puree: Impaired Presentation: Self Fed;Spoon Oral Phase Functional Implications: Right anterior spillage Solid Solid: Impaired Presentation: Self Fed;Spoon Oral Phase Functional Implications: Right anterior spillage BSE Assessment Risk for Aspiration Impact on safety and function: Mild aspiration risk  Short Term Goals: Week 1: SLP Short Term Goal 1 (Week 1): Patient will follow basic commands with 90% accuracy and Min A multimodal cues. SLP Short Term Goal 2 (Week 1): Patient will answer mildly complex yes/no  questions with 75% accuracy with Min A multimodal cues. SLP Short Term Goal 3 (Week 1): Patient will utilize mulitmodal communication to express wants/needs with Mod A multimodal cues. SLP Short Term Goal 4 (Week 1): Patient will demonstrate functional problem solving for basic and familiar tasks with Mod A verbal cues. SLP Short Term Goal 5 (Week 1): Patient will demonstrate selective attention to functional tasks in a mildly distracting enviornment for ~45 minutes with Min verbal cues for redirection.  Refer to Care Plan for Long Term Goals  Recommendations for other services: None   Discharge Criteria: Patient will be discharged from SLP if patient refuses treatment 3 consecutive times without medical reason, if treatment goals not met, if there is a change in medical status, if patient makes no progress towards goals or if patient is discharged from hospital.  The above assessment, treatment plan, treatment alternatives and goals were discussed and mutually agreed upon: by patient  Melanie Madden 12/17/2020, 1:57 PM

## 2020-12-17 NOTE — Evaluation (Signed)
Occupational Therapy Assessment and Plan  Patient Details  Name: Melanie Madden MRN: [id number] Date of Birth: Dec 06, 1965  OT Diagnosis: abnormal posture, apraxia, cognitive deficits, flaccid hemiplegia and hemiparesis, hemiplegia affecting dominant side and muscle weakness (generalized) Rehab Potential: Rehab Potential (ACUTE ONLY): Good ELOS: 12-14   Today's Date: 12/17/2020 OT Individual Time: 1000-1100 OT Individual Time Calculation (min): 60 min     Hospital Problem: Principal Problem:   Debility   Past Medical History:  Past Medical History:  Diagnosis Date  . Allergy   . Anxiety   . Arthritis   . High triglycerides   . Kidney stones   . Migraines   . Recurrent sinus infections    Past Surgical History:  Past Surgical History:  Procedure Laterality Date  . ANTERIOR CRUCIATE LIGAMENT REPAIR Left   . BUNIONECTOMY Right   . IR NEPHROSTOMY PLACEMENT RIGHT  12/09/2020  . LASIK    . LITHOTRIPSY    . MENISCUS REPAIR Left   . NASAL SEPTUM SURGERY    . SHOULDER SURGERY    . VARICOSE VEIN SURGERY Left     Assessment & Plan Clinical Impression: Patient is 55 year old female  who was brought from home to the ER with syncopal episode, pt found to have septic shock from  infection emphysematous pyelonephritis with infected kidney stones. Pt taken to IR for percutaneous nephrostomy tube.  PMH: stroke with right-sided hemiplegia and global aphasia, hypertension  Patient currently requires max with basic self-care skills secondary to muscle weakness, decreased cardiorespiratoy endurance, impaired timing and sequencing, abnormal tone, unbalanced muscle activation, motor apraxia and decreased coordination, decreased attention to right, decreased attention, decreased awareness, decreased problem solving and decreased safety awareness and decreased sitting balance, decreased standing balance, decreased postural control and decreased balance strategies.  Prior to  hospitalization, patient could complete BADL with min.  Patient will benefit from skilled intervention to decrease level of assist with basic self-care skills and increase independence with basic self-care skills prior to discharge home with care partner.  Anticipate patient will require 24 hour supervision and minimal physical assistance and follow up home health.  OT - End of Session Activity Tolerance: Tolerates 30+ min activity with multiple rests Endurance Deficit: Yes OT Assessment Rehab Potential (ACUTE ONLY): Good OT Patient demonstrates impairments in the following area(s): Safety;Balance;Cognition;Edema;Endurance;Motor;Pain;Perception;Sensory;Behavior OT Basic ADL's Functional Problem(s): Grooming;Bathing;Dressing;Toileting OT Transfers Functional Problem(s): Toilet;Tub/Shower OT Plan OT Intensity: Minimum of 1-2 x/day, 45 to 90 minutes OT Frequency: 5 out of 7 days OT Duration/Estimated Length of Stay: 12-14 OT Treatment/Interventions: Balance/vestibular training;Discharge planning;Functional electrical stimulation;Pain management;Self Care/advanced ADL retraining;Therapeutic Activities;UE/LE Coordination activities;Visual/perceptual remediation/compensation;Therapeutic Exercise;Skin care/wound managment;Patient/family education;Functional mobility training;Disease mangement/prevention;Cognitive remediation/compensation;Community reintegration;DME/adaptive equipment instruction;Neuromuscular re-education;Psychosocial support;Splinting/orthotics;UE/LE Strength taining/ROM;Wheelchair propulsion/positioning OT Self Feeding Anticipated Outcome(s): S OT Basic Self-Care Anticipated Outcome(s): MIN OT Toileting Anticipated Outcome(s): MIN OT Bathroom Transfers Anticipated Outcome(s): MIN OT Recommendation Patient destination: Home Follow Up Recommendations: Home health OT Equipment Details: pt has equiment from last admission   OT Evaluation Precautions/Restrictions   Precautions Precaution Comments: global aphasic, residual R hemiplegia Restrictions Weight Bearing Restrictions: No General Chart Reviewed: Yes Family/Caregiver Present: No Vital Signs   Pain Pain Assessment Pain Scale: 0-10 Pain Score: 0-No pain Faces Pain Scale: No hurt Home Living/Prior Functioning Home Living Available Help at Discharge: Family,Available 24 hours/day,Personal care attendant Type of Home: House Home Access: Stairs to enter [employer] of Steps: 2-3 Entrance Stairs-Rails: None Home Layout: Able to live on main level with bedroom/bathroom,Two level Alternate Level Stairs-Number of Steps: flight  Bathroom Shower/Tub: Optometrist: Yes  Lives With: Spouse IADL History Homemaking Responsibilities: No Current License: No Prior Function Level of Independence: Needs assistance with ADLs Driving: No Comments: pt also going to outpt PT 2x/wk Vision Vision Assessment?: No apparent visual deficits Perception  Perception: Impaired (R inattention) Praxis Praxis: Impaired Praxis Impairment Details: Motor planning Cognition Overall Cognitive Status: History of cognitive impairments - at baseline Orientation Level: Nonverbal/unable to assess Memory: Impaired Awareness: Impaired Behaviors: Impulsive Safety/Judgment: Impaired Sensation Coordination Gross Motor Movements are Fluid and Coordinated: No Fine Motor Movements are Fluid and Coordinated: No Coordination and Movement Description: dense R hemiplegia UE>LE at baseline Motor  Motor Motor: Hemiplegia;Abnormal postural alignment and control;Abnormal tone Motor - Skilled Clinical Observations: dense-flaccid R hemi UE  Trunk/Postural Assessment  Cervical Assessment Cervical Assessment:  (head forward) Thoracic Assessment Thoracic Assessment:  (rounded shoudlers) Lumbar Assessment Lumbar Assessment:  (post pelvic tilt) Postural  Control Postural Control: Deficits on evaluation (delayed/absent R)  Balance   Extremity/Trunk Assessment RUE Assessment RUE Assessment: Exceptions to [facility] RUE Body System: Neuro Brunstrum levels for arm and hand: Arm;Hand Brunstrum level for arm: Stage I Presynergy Brunstrum level for hand: Stage I Flaccidity LUE Assessment LUE Assessment: Within Functional Limits  Care Tool Care Tool Self Care Eating        Oral Care    Oral Care Assist Level: Minimal Assistance - Patient > 75%    Bathing   Body parts bathed by patient: Right arm;Chest;Abdomen;Front perineal area;Right upper leg;Left upper leg;Left lower leg;Face Body parts bathed by helper: Left arm;Buttocks;Right lower leg   Assist Level: Maximal Assistance - Patient 24 - 49%    Upper Body Dressing(including orthotics)   What is the patient wearing?: Pull over shirt   Assist Level: Minimal Assistance - Patient > 75%    Lower Body Dressing (excluding footwear)   What is the patient wearing?: Pants Assist for lower body dressing: Maximal Assistance - Patient 25 - 49%    Putting on/Taking off footwear   What is the patient wearing?: Non-skid slipper socks Assist for footwear: Dependent - Patient 0%       Care Tool Toileting Toileting activity   Assist for toileting: Maximal Assistance - Patient 25 - 49%     Care Tool Bed Mobility Roll left and right activity   Roll left and right assist level: Moderate Assistance - Patient 50 - 74%    Sit to lying activity Sit to lying activity did not occur: Safety/medical concerns      Lying to sitting edge of bed activity Lying to sitting edge of bed activity did not occur: Safety/medical concerns       Care Tool Transfers Sit to stand transfer   Sit to stand assist level: Maximal Assistance - Patient 25 - 49%    Chair/bed transfer   Chair/bed transfer assist level: Maximal Assistance - Patient 25 - 49%     Toilet transfer   Assist Level: Maximal Assistance -  Patient 24 - 49%     Care Tool Cognition Expression of Ideas and Wants Expression of Ideas and Wants: Rarely/Never expressess or very difficult - rarely/never expresses self or speech is very difficult to understand   Understanding Verbal and Non-Verbal Content Understanding Verbal and Non-Verbal Content: Sometimes understands - understands only basic conversations or simple, direct phrases. Frequently requires cues to understand   Memory/Recall Ability *first 3 days only Memory/Recall Ability *first 3 days only: That he or she is in a  hospital/hospital unit    Refer to Care Plan for Long Term Goals  SHORT TERM GOAL WEEK 1 OT Short Term Goal 1 (Week 1): Pt will transfer to toilet wit MOD A and LRAD consistently OT Short Term Goal 2 (Week 1): Pt will dons shirt wiht S OT Short Term Goal 3 (Week 1): Pt will thread BLE wiht S  Recommendations for other services: None    Skilled Therapeutic Intervention Pt received in bed agreeable to OT and semi familiar with this clinician d/t previous admission. Pt easily frustrated throughout session by decline in funciton. Pt requires mod-max A for squat and stand pivot transfers with R knee block. Pt completes BADL as written below at sink level. Pt would benefit from skilled OT To improve cardioendurance/strength lost during current hospital stay in prep for home to decrease BOC and improve independence  ADL ADL Grooming: Moderate assistance Where Assessed-Grooming: Sitting at sink Upper Body Bathing: Minimal assistance Where Assessed-Upper Body Bathing: Sitting at sink Lower Body Bathing: Maximal assistance Where Assessed-Lower Body Bathing: Sitting at sink;Standing at sink Upper Body Dressing: Minimal assistance Where Assessed-Upper Body Dressing: Sitting at sink Lower Body Dressing: Maximal assistance Where Assessed-Lower Body Dressing: Sitting at sink;Standing at sink Toileting: Maximal assistance Where Assessed-Toileting: Bedside  Commode Toilet Transfer: Maximal assistance Tub/Shower Transfer: Maximal assistance Tub/Shower Transfer Method: Squat pivot Tub/Shower Equipment: Transfer tub bench Mobility  Transfers Sit to Stand: Maximal Assistance - Patient 25-49%;Moderate Assistance - Patient 50-74% Stand to Sit: Maximal Assistance - Patient 25-49%;Moderate Assistance - Patient 50-74%   Discharge Criteria: Patient will be discharged from OT if patient refuses treatment 3 consecutive times without medical reason, if treatment goals not met, if there is a change in medical status, if patient makes no progress towards goals or if patient is discharged from hospital.  The above assessment, treatment plan, treatment alternatives and goals were discussed and mutually agreed upon: No family available/patient unable  Tonny Branch 12/17/2020, 12:25 PM

## 2020-12-17 NOTE — Plan of Care (Signed)
  Problem: RH Balance Goal: LTG Patient will maintain dynamic sitting balance (PT) Description: LTG:  Patient will maintain dynamic sitting balance with assistance during mobility activities (PT) Flowsheets (Taken 12/17/2020 1404) LTG: Pt will maintain dynamic sitting balance during mobility activities with:: Supervision/Verbal cueing Goal: LTG Patient will maintain dynamic standing balance (PT) Description: LTG:  Patient will maintain dynamic standing balance with assistance during mobility activities (PT) Flowsheets (Taken 12/17/2020 1404) LTG: Pt will maintain dynamic standing balance during mobility activities with:: Moderate Assistance - Patient 50 - 74%   Problem: Sit to Stand Goal: LTG:  Patient will perform sit to stand with assistance level (PT) Description: LTG:  Patient will perform sit to stand with assistance level (PT) Flowsheets (Taken 12/17/2020 1404) LTG: PT will perform sit to stand in preparation for functional mobility with assistance level: Minimal Assistance - Patient > 75%   Problem: RH Bed Mobility Goal: LTG Patient will perform bed mobility with assist (PT) Description: LTG: Patient will perform bed mobility with assistance, with/without cues (PT). Flowsheets (Taken 12/17/2020 1404) LTG: Pt will perform bed mobility with assistance level of: Minimal Assistance - Patient > 75%   Problem: RH Bed to Chair Transfers Goal: LTG Patient will perform bed/chair transfers w/assist (PT) Description: LTG: Patient will perform bed to chair transfers with assistance (PT). Flowsheets (Taken 12/17/2020 1404) LTG: Pt will perform Bed to Chair Transfers with assistance level: Minimal Assistance - Patient > 75%   Problem: RH Car Transfers Goal: LTG Patient will perform car transfers with assist (PT) Description: LTG: Patient will perform car transfers with assistance (PT). Flowsheets (Taken 12/17/2020 1404) LTG: Pt will perform car transfers with assist:: Minimal Assistance - Patient  > 75%   Problem: RH Ambulation Goal: LTG Patient will ambulate in controlled environment (PT) Description: LTG: Patient will ambulate in a controlled environment, # of feet with assistance (PT). Flowsheets (Taken 12/17/2020 1404) LTG: Pt will ambulate in controlled environ  assist needed:: Moderate Assistance - Patient 50 - 74% LTG: Ambulation distance in controlled environment: 2ft with LRAD Goal: LTG Patient will ambulate in home environment (PT) Description: LTG: Patient will ambulate in home environment, # of feet with assistance (PT). Flowsheets (Taken 12/17/2020 1404) LTG: Pt will ambulate in home environ  assist needed:: Moderate Assistance - Patient 50 - 74% LTG: Ambulation distance in home environment: 19ft with LRAD   Problem: RH Wheelchair Mobility Goal: LTG Patient will propel w/c in controlled environment (PT) Description: LTG: Patient will propel wheelchair in controlled environment, # of feet with assist (PT) Flowsheets (Taken 12/17/2020 1404) LTG: Pt will propel w/c in controlled environ  assist needed:: Set up assist LTG: Propel w/c distance in controlled environment: 128ft Goal: LTG Patient will propel w/c in home environment (PT) Description: LTG: Patient will propel wheelchair in home environment, # of feet with assistance (PT). Flowsheets (Taken 12/17/2020 1404) LTG: Propel w/c distance in home environment: 73ft   Problem: RH Stairs Goal: LTG Patient will ambulate up and down stairs w/assist (PT) Description: LTG: Patient will ambulate up and down # of stairs with assistance (PT) Flowsheets (Taken 12/17/2020 1404) LTG: Pt will ambulate up/down stairs assist needed:: Moderate Assistance - Patient 50 - 74% LTG: Pt will  ambulate up and down number of stairs: 3 steps to access house with LUE support

## 2020-12-17 NOTE — Progress Notes (Signed)
Foley catheter discontinued as ordered.

## 2020-12-17 NOTE — Evaluation (Signed)
Physical Therapy Assessment and Plan  Patient Details  Name: Melanie Madden MRN: [id number] Date of Birth: Nov 27, 1965  PT Diagnosis: Abnormal posture, Abnormality of gait, Difficulty walking, Hemiplegia dominant, Impaired sensation and Muscle weakness Rehab Potential: Good ELOS: 14-17 days   Today's Date: 12/17/2020 PT Individual Time: 1017-5102 PT Individual Time Calculation (min): 58 min    Hospital Problem: Principal Problem:   Debility Active Problems:   Hyponatremia   Past Medical History:  Past Medical History:  Diagnosis Date  . Allergy   . Anxiety   . Arthritis   . High triglycerides   . Kidney stones   . Migraines   . Recurrent sinus infections    Past Surgical History:  Past Surgical History:  Procedure Laterality Date  . ANTERIOR CRUCIATE LIGAMENT REPAIR Left   . BUNIONECTOMY Right   . IR NEPHROSTOMY PLACEMENT RIGHT  12/09/2020  . LASIK    . LITHOTRIPSY    . MENISCUS REPAIR Left   . NASAL SEPTUM SURGERY    . SHOULDER SURGERY    . 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 She was treated with fluids, pressors, broad spectrum antibiotics and right nephrostomy tube placed by interventional radiology.  Blood cultures 2/4 were positive for Enterobacterales and E. Coli. Dr. Unknown Foley consulted and recommended supportive care with outpatient follow up for stone extraction in 2-3 weeks, once infection cleared. AKI resolving with downward trend in SCr but did have rise in WBCs from 19.6 on 05/19 therefore urine from urostomy cultured and showed not growth. She has  had issues with intermittent hypokalemia which has been supplemented, hypoglycemia felt to be due to poor intake as well as thrombocytopenia due to sepsis. Therapy initiated and patient with resulting functional deficits with cognition, weakness, self-care. At baseline, patient had a caregiver during the day but was able to stand and take a few steps?  Patient noted to be deconditioned with decline in functional status  Patient transferred to CIR on 12/16/2020 .   Patient currently requires max with mobility secondary to muscle weakness, muscle joint tightness and muscle paralysis, decreased cardiorespiratoy endurance, abnormal tone, unbalanced muscle activation and decreased coordination, decreased attention to right and decreased sitting balance, decreased standing balance, decreased postural control, hemiplegia and decreased balance strategies.  Prior to hospitalization, patient was min with mobility and lived with Spouse in a House home.  Home access is 2-3Stairs to enter.  Patient will benefit from skilled PT intervention to maximize safe functional mobility, minimize fall risk and decrease caregiver burden for planned discharge home with 24 hour assist.  Anticipate patient will benefit from follow up [facility] at discharge.  PT - End of Session Activity Tolerance: Tolerates < 10 min activity, no significant change in vital signs Endurance Deficit: Yes PT Assessment Rehab Potential (ACUTE/IP ONLY): Good PT Barriers to Discharge: [place] home environment;Home environment access/layout;Insurance for SNF coverage;Pending surgery;Behavior;Wound Care;Incontinence PT Patient demonstrates impairments in the following area(s): Balance;Behavior;Endurance;Motor;Perception;Safety;Sensory;Skin Integrity PT Transfers Functional Problem(s): Bed Mobility;Bed to Chair;Car;Furniture;Floor PT Locomotion Functional Problem(s): Ambulation;Wheelchair Mobility;Stairs PT Plan PT Intensity: Minimum of 1-2 x/day ,45 to  90 minutes PT Frequency: 5 out  of 7 days PT Duration Estimated Length of Stay: 14-17 days PT Treatment/Interventions: Ambulation/gait training;Community reintegration;DME/adaptive equipment instruction;Neuromuscular re-education;Psychosocial support;Stair training;UE/LE Strength taining/ROM;Wheelchair propulsion/positioning;Balance/vestibular training;Discharge planning;Functional electrical stimulation;Pain management;Skin care/wound management;Therapeutic Activities;UE/LE Coordination activities;Cognitive remediation/compensation;Disease management/prevention;Functional mobility training;Patient/family education;Splinting/orthotics;Therapeutic Exercise;Visual/perceptual remediation/compensation PT Transfers Anticipated Outcome(s): Min assist with LRAD PT Locomotion Anticipated Outcome(s): Mod assist gait with LRAD and supervision assist WC mobility PT Recommendation Recommendations for Other Services: Therapeutic Recreation consult Therapeutic Recreation Interventions: Stress management Follow Up Recommendations: Home health PT Patient destination: Home Equipment Recommended: To be determined   PT Evaluation Precautions/Restrictions Precautions Precaution Comments: global aphasic, residual R hemiplegia, urostomy fall.  General   Vital Signs   Sitting: 132/76. HR 101.  Standing 161/95, HR 123.  Sitting: 143/74, HR 101.  Pain Pain Assessment Pain Scale: 0-10 Pain Score: 0-No pain Home Living/Prior Functioning Home Living Available Help at Discharge: Family;Available 24 hours/day;Personal care attendant Type of Home: House Home Access: Stairs to enter [employer] of Steps: 2-3 Entrance Stairs-Rails: None Home Layout: Able to live on main level with bedroom/bathroom;Two level Alternate Level Stairs-Number of Steps: flight Bathroom Shower/Tub: Optometrist: Yes  Lives With: Spouse Prior Function Level of  Independence: Needs assistance with ADLs  Able to Take Stairs?: Yes Driving: No Comments: pt also going to outpt PT 2x/wk Vision/Perception     Mild R inattention.  Cognition Overall Cognitive Status: History of cognitive impairments - at baseline Arousal/Alertness: Awake/alert Orientation Level: Oriented to person;Oriented to place (via written aid) Attention: Sustained Sustained Attention: Impaired Sustained Attention Impairment: Functional basic Memory: Impaired Awareness: Impaired Awareness Impairment: Emergent impairment Problem Solving: Impaired Problem Solving Impairment: Functional basic Behaviors: Impulsive Safety/Judgment: Impaired Sensation Sensation Light Touch: Impaired Detail Light Touch Impaired Details: Impaired RLE Proprioception: Impaired by gross assessment;Impaired Detail Proprioception Impaired Details: Impaired RLE Coordination Gross Motor Movements are Fluid and Coordinated: No Fine Motor Movements are Fluid and Coordinated: No Coordination and Movement Description: dense R hemiplegia UE>LE at baseline Motor  Motor Motor: Hemiplegia;Abnormal postural alignment and control;Abnormal tone Motor - Skilled Clinical Observations: dense-flaccid R hemi UE and trace movement noted in the RLE   Trunk/Postural Assessment  Cervical Assessment Cervical Assessment: Exceptions to [facility] (head forward) Thoracic Assessment Thoracic Assessment: Exceptions to [facility] (rounded shoudlers L bias due to trunkal weakness on the R) Lumbar Assessment Lumbar Assessment: Exceptions to [facility] (post pelvic tilt) Postural Control Postural Control: Deficits on evaluation (delayed/absent R)  Balance Balance Balance Assessed: Yes Static Sitting Balance Static Sitting - Balance Support: Left upper extremity supported Static Sitting - Level of Assistance: 3: Mod assist Dynamic Sitting Balance Dynamic Sitting - Balance Support: Left upper extremity supported Dynamic Sitting - Level of  Assistance: 3: Mod assist;2: Max assist Static Standing Balance Static Standing - Balance Support: Left upper extremity supported Static Standing - Level of Assistance: 2: Max assist Dynamic Standing Balance Dynamic Standing - Balance Support: Left upper extremity supported Dynamic Standing - Level of Assistance: 2: Max assist;1: +1 Total assist Extremity Assessment      RLE Assessment RLE Assessment: Exceptions to [facility] RLE Strength Right Hip Flexion: 2-/5 Right Hip Extension: 2-/5 Right Hip ABduction: 1/5 Right Hip ADduction: 1/5 Right Knee Flexion: 1/5 Right Knee Extension: 2-/5 LLE Assessment LLE Assessment: Within Functional Limits General Strength Comments: grossly 4+/5 proximal to distal  Care Tool Care Tool Bed Mobility Roll left and right activity   Roll left and right assist level: Maximal Assistance - Patient 25 - 49%    Sit to lying  activity   Sit to lying assist level: Maximal Assistance - Patient 25 - 49%    Lying to sitting edge of bed activity   Lying to sitting edge of bed assist level: Maximal Assistance - Patient 25 - 49%     Care Tool Transfers Sit to stand transfer   Sit to stand assist level: Maximal Assistance - Patient 25 - 49%    Chair/bed transfer   Chair/bed transfer assist level: Maximal Assistance - Patient 25 - 49%     Toilet transfer   Assist Level: Maximal Assistance - Patient 24 - 49%    Car transfer Car transfer activity did not occur: Safety/medical concerns        Care Tool Locomotion Ambulation Ambulation activity did not occur: Safety/medical concerns        Walk 10 feet activity Walk 10 feet activity did not occur: Safety/medical concerns       Walk 50 feet with 2 turns activity Walk 50 feet with 2 turns activity did not occur: Safety/medical concerns      Walk 150 feet activity Walk 150 feet activity did not occur: Safety/medical concerns      Walk 10 feet on uneven surfaces activity Walk 10 feet on uneven surfaces  activity did not occur: Safety/medical concerns      Stairs Stair activity did not occur: Safety/medical concerns        Walk up/down 1 step activity Walk up/down 1 step or curb (drop down) activity did not occur: Safety/medical concerns     Walk up/down 4 steps activity did not occuR: Safety/medical concerns  Walk up/down 4 steps activity      Walk up/down 12 steps activity Walk up/down 12 steps activity did not occur: Safety/medical concerns      Pick up small objects from floor        Wheelchair   Type of Wheelchair: Manual   Wheelchair assist level: Minimal Assistance - Patient > 75% Max wheelchair distance: 150  Wheel 50 feet with 2 turns activity   Assist Level: Minimal Assistance - Patient > 75%  Wheel 150 feet activity   Assist Level: Minimal Assistance - Patient > 75%    Refer to Care Plan for Long Term Goals  SHORT TERM GOAL WEEK 1 PT Short Term Goal 1 (Week 1): Pt will perform bed mobility with mod assist PT Short Term Goal 2 (Week 1): Pt will preformed bed<>WC transfer with mod assist PT Short Term Goal 3 (Week 1): Pt will perform gait training with LRAD for 3ft with mod assit and +2 for safety  Recommendations for other services: Therapeutic Recreation  Stress management  Skilled Therapeutic Intervention   Pt received supine in bed and agreeable to PT. Supine>sit transfer with max assist on the L. Sitting balance EOB with mod-max assist as listed below with lateral bias to the L. PT instructed patient in PT Evaluation and initiated treatment intervention; see above for results. PT educated patient in [employer], rehab potential, rehab goals, and discharge recommendations along with recommendation for follow-up rehabilitation services.  Squat pivot transfer to [facility] with max-total A form PT with RLE blocked. Poor motor planning noted by pt. Pt donned ted hose and performed VS assessment. See above. Mild dizziness notes in sitting. Sit<>stand from [facility] at rail in hall x 4  with max assist. Gait training with max assist x 5 ft and RAFO in place. Pt required to advance the RLE. WC mobility as listed below with min assist and use  of LLE only, as pt declined to use UE. Kinetron BLE reciprocal movement training with mod assist to initiate movement on the RLE into extension.  Pt returned to room and performed stand pivot transfer to bed with max assist. Sit>supine completed with mod-max assistt, and left supine in bed with call bell in reach and all needs met.      Mobility Transfers Transfers: Sit to Stand;Stand to Sit;Stand Pivot Transfers;Squat Pivot Transfers Sit to Stand: Maximal Assistance - Patient 25-49%;Moderate Assistance - Patient 50-74% Stand to Sit: Maximal Assistance - Patient 25-49%;Moderate Assistance - Patient 50-74% Squat Pivot Transfers: Maximal Assistance - Patient 25-49%;Moderate Assistance - Patient 50-74% Transfer (Assistive device): 1 person hand held assist Locomotion  Gait Ambulation: Yes Gait Assistance: Maximal Assistance - Patient 25-49% Gait Distance (Feet): 5 Feet Assistive device: Other (Comment) (rail in hall) Gait Gait: Yes Gait Pattern: Impaired Gait Pattern: Right foot flat;Lateral trunk lean to right;Lateral hip instability;Right flexed knee in stance (AFO) Stairs / Additional Locomotion Stairs: No Architect: Yes Wheelchair Assistance: Minimal assistance - Patient >75% Wheelchair Propulsion: Left lower extremity Wheelchair Parts Management: Needs assistance Distance: 150   Discharge Criteria: Patient will be discharged from PT if patient refuses treatment 3 consecutive times without medical reason, if treatment goals not met, if there is a change in medical status, if patient makes no progress towards goals or if patient is discharged from hospital.  The above assessment, treatment plan, treatment alternatives and goals were discussed and mutually agreed upon: by patient  Lorie Phenix 12/17/2020, 2:26 PM

## 2020-12-18 DIAGNOSIS — N390 Urinary tract infection, site not specified: Secondary | ICD-10-CM

## 2020-12-18 DIAGNOSIS — E871 Hypo-osmolality and hyponatremia: Secondary | ICD-10-CM | POA: Diagnosis not present

## 2020-12-18 DIAGNOSIS — R739 Hyperglycemia, unspecified: Secondary | ICD-10-CM

## 2020-12-18 DIAGNOSIS — I10 Essential (primary) hypertension: Secondary | ICD-10-CM | POA: Diagnosis not present

## 2020-12-18 DIAGNOSIS — R5381 Other malaise: Secondary | ICD-10-CM | POA: Diagnosis not present

## 2020-12-18 DIAGNOSIS — B9620 Unspecified Escherichia coli [E. coli] as the cause of diseases classified elsewhere: Secondary | ICD-10-CM

## 2020-12-18 DIAGNOSIS — R339 Retention of urine, unspecified: Secondary | ICD-10-CM

## 2020-12-18 LAB — GLUCOSE, CAPILLARY
Glucose-Capillary: 121 mg/dL — ABNORMAL HIGH (ref 70–99)
Glucose-Capillary: 126 mg/dL — ABNORMAL HIGH (ref 70–99)
Glucose-Capillary: 135 mg/dL — ABNORMAL HIGH (ref 70–99)
Glucose-Capillary: 152 mg/dL — ABNORMAL HIGH (ref 70–99)

## 2020-12-18 NOTE — Progress Notes (Signed)
[place] PHYSICAL MEDICINE & REHABILITATION PROGRESS NOTE  Subjective/Complaints: Patient seen sitting up in bed this morning.  No reported issues overnight.  She consistently nods in the affirmative when asked questions, however, as I was leaving the room patient yells "Eh!" and motions for me to close the door.  ROS: Denies CP, SOB, N/V/D  Objective: Vital Signs: Blood pressure 135/74, pulse 95, temperature 98.9 F (37.2 C), temperature source Oral, resp. rate 17, weight 67.8 kg, SpO2 94 %. No results found. Recent Labs    12/16/20 0928 12/17/20 0503  WBC 14.7* 14.0*  HGB 10.7* 10.8*  HCT 31.6* 31.8*  PLT 98* 151   Recent Labs    12/17/20 0503  NA 133*  K 4.8  CL 102  CO2 21*  GLUCOSE 122*  BUN 34*  CREATININE 1.00  CALCIUM 9.0    Intake/Output Summary (Last 24 hours) at 12/18/2020 0934 Last data filed at 12/18/2020 0900 Gross per 24 hour  Intake 560 ml  Output 1675 ml  Net -1115 ml        Physical Exam: BP 135/74   Pulse 95   Temp 98.9 F (37.2 C) (Oral)   Resp 17   Wt 67.8 kg   SpO2 94%   BMI 23.41 kg/m  Constitutional: No distress . Vital signs reviewed. HENT: Normocephalic.  Atraumatic. Eyes: EOMI. No discharge. Cardiovascular: No JVD.  RRR. Respiratory: Normal effort.  No stridor.  Bilateral clear to auscultation. GI: Non-distended.  BS +. GU: + Nephrostomy drain Skin: Warm and dry.  Intact. Psych: Flat.  Limited engagement. Musc: RUE/RLE: With mild edema, stable.  No tenderness in extremities. Neuro: Alert Global aphasia, stable Motor: Limited to participation, no movement noted on right side   Assessment/Plan: 1. Functional deficits which require 3+ hours per day of interdisciplinary therapy in a comprehensive inpatient rehab setting.  Physiatrist is providing close team supervision and 24 hour management of active medical problems listed below.  Physiatrist and rehab team continue to assess barriers to discharge/monitor patient  progress toward functional and medical goals   Care Tool:  Bathing    Body parts bathed by patient: Right arm,Chest,Abdomen,Front perineal area,Right upper leg,Left upper leg,Left lower leg,Face   Body parts bathed by helper: Left arm,Buttocks,Right lower leg     Bathing assist Assist Level: Maximal Assistance - Patient 24 - 49%     Upper Body Dressing/Undressing Upper body dressing   What is the patient wearing?: Pull over shirt    Upper body assist Assist Level: Minimal Assistance - Patient > 75%    Lower Body Dressing/Undressing Lower body dressing      What is the patient wearing?: Incontinence brief     Lower body assist Assist for lower body dressing: Maximal Assistance - Patient 25 - 49%     Toileting Toileting    Toileting assist Assist for toileting: Total Assistance - Patient < 25%     Transfers Chair/bed transfer  Transfers assist     Chair/bed transfer assist level: Maximal Assistance - Patient 25 - 49%     Locomotion Ambulation   Ambulation assist   Ambulation activity did not occur: Safety/medical concerns          Walk 10 feet activity   Assist  Walk 10 feet activity did not occur: Safety/medical concerns        Walk 50 feet activity   Assist Walk 50 feet with 2 turns activity did not occur: Safety/medical concerns  Walk 150 feet activity   Assist Walk 150 feet activity did not occur: Safety/medical concerns         Walk 10 feet on uneven surface  activity   Assist Walk 10 feet on uneven surfaces activity did not occur: Safety/medical concerns         Wheelchair     Assist   Type of Wheelchair: Manual    Wheelchair assist level: Minimal Assistance - Patient > 75% Max wheelchair distance: 150    Wheelchair 50 feet with 2 turns activity    Assist        Assist Level: Minimal Assistance - Patient > 75%   Wheelchair 150 feet activity     Assist      Assist Level: Minimal  Assistance - Patient > 75%    Medical Problem List and Plan: 1.  Deficits with cognition, mobility, transfers, self-care secondary to debility with history of left basal ganglia ICH with resulting global aphasia.  Continue CIR 2.  Antithrombotics: -DVT/anticoagulation:  Mechanical: Sequential compression devices, below knee Bilateral lower extremities             -antiplatelet therapy: N/A 3. 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   Appears to be controlled on 5/22  Monitor with increased exertion 4. Mood: LCSW to follow for evaluation and support.              -antipsychotic agents: N/A 5. Neuropsych: This patient is not capable of making decisions on her own behalf. 6. Skin/Wound Care: Routine pressure relief measures.  7. Fluids/Electrolytes/Nutrition: Monitor I/Os.  8. E.coli UTI: antibiotics narrowed to IV ceftriaxone, discussed with pharmacy changed to Keflex--Day #9/14 .  9. Left emphysematous pyelitis s/p nephrostomy: Continue to monitor output dily.              --Radiology following for input.   Foley DC'd, voiding trial 10. Leucocytosis:  .               WBCs 14.0 on 5/21  Afebrile  Monitor for signs and symptoms of infection 11. Thrombocytopenia:   Platelets 151 on 5/21, improving  12. Abnormal LFTs:              LFTs elevated on 5/21  Continue to monitor 13. Acute kidney injury:    Creatinine 1.0 on 5/21  Continue to monitor 14. HTN:  Hypotension has resolved. Continue to hold norvasc.   Controlled on 5/22             Monitor his increase mobility 15. Adjustment disorder: Managed on Seroquel bid. 16. Stress induced hyperglycemia: Hgb A1C-5.2 but BS labile             --Continue to monitor with ac/hs CBG checks.            slightly elevated on 5/22, monitor for now  Monitor with increased mobility 17.  Hyponatremia  Sodium 133 on 5/21  Continue to monitor  LOS: 2 days A FACE TO FACE EVALUATION WAS PERFORMED  Azriel Dancy  Lorie Phenix 12/18/2020, 9:34 AM

## 2020-12-19 DIAGNOSIS — R5381 Other malaise: Secondary | ICD-10-CM | POA: Diagnosis not present

## 2020-12-19 LAB — BASIC METABOLIC PANEL
Anion gap: 7 (ref 5–15)
BUN: 35 mg/dL — ABNORMAL HIGH (ref 6–20)
CO2: 23 mmol/L (ref 22–32)
Calcium: 9 mg/dL (ref 8.9–10.3)
Chloride: 103 mmol/L (ref 98–111)
Creatinine, Ser: 1.01 mg/dL — ABNORMAL HIGH (ref 0.44–1.00)
GFR, Estimated: >60 mL/min (ref >60)
Glucose, Bld: 121 mg/dL — ABNORMAL HIGH (ref 70–99)
Potassium: 4.1 mmol/L (ref 3.5–5.1)
Sodium: 133 mmol/L — ABNORMAL LOW (ref 135–145)

## 2020-12-19 LAB — URINALYSIS, ROUTINE W REFLEX MICROSCOPIC
Bilirubin Urine: NEGATIVE
Glucose, UA: NEGATIVE mg/dL
Hgb urine dipstick: NEGATIVE
Ketones, ur: NEGATIVE mg/dL
Leukocytes,Ua: NEGATIVE
Nitrite: NEGATIVE
Protein, ur: NEGATIVE mg/dL
Specific Gravity, Urine: 1.009 (ref 1.005–1.030)
pH: 7 (ref 5.0–8.0)

## 2020-12-19 LAB — CBC
HCT: 32.7 % — ABNORMAL LOW (ref 36.0–46.0)
Hemoglobin: 10.9 g/dL — ABNORMAL LOW (ref 12.0–15.0)
MCH: 29.3 pg (ref 26.0–34.0)
MCHC: 33.3 g/dL (ref 30.0–36.0)
MCV: 87.9 fL (ref 80.0–100.0)
Platelets: 287 10*3/uL (ref 150–400)
RBC: 3.72 MIL/uL — ABNORMAL LOW (ref 3.87–5.11)
RDW: 14 % (ref 11.5–15.5)
WBC: 9.7 10*3/uL (ref 4.0–10.5)
nRBC: 0 % (ref 0.0–0.2)

## 2020-12-19 LAB — GLUCOSE, CAPILLARY
Glucose-Capillary: 105 mg/dL — ABNORMAL HIGH (ref 70–99)
Glucose-Capillary: 200 mg/dL — ABNORMAL HIGH (ref 70–99)
Glucose-Capillary: 174 mg/dL — ABNORMAL HIGH (ref 70–99)
Glucose-Capillary: 178 mg/dL — ABNORMAL HIGH (ref 70–99)

## 2020-12-19 NOTE — Progress Notes (Signed)
Occupational Therapy Session Note  Patient Details  Name: Melanie Madden MRN: [id number] Date of Birth: 30-Jun-1966  Today's Date: 12/19/2020 OT Individual Time: 0802-0901 OT Individual Time Calculation (min): 59 min    Short Term Goals: Week 1:  OT Short Term Goal 1 (Week 1): Pt will transfer to toilet wit MOD A and LRAD consistently OT Short Term Goal 2 (Week 1): Pt will dons shirt wiht S OT Short Term Goal 3 (Week 1): Pt will thread BLE wiht S  Skilled Therapeutic Interventions/Progress Updates:    Pt completed transfer from supine to sit EOB with mod assist with squat pivot transfer over to the wheelchair.  She motioned the need to toilet, so completed stand pivot transfer to the [facility] over the toilet with max assist. Noted severe right knee hyperextension with increased ankle inversion.  She was unsuccessful with attempt to toilet, but she was already incontinent of bladder in the brief.  Had her transfer back to the wheelchair at max assist for work on bathing and dressing at the sink.  She was able to complete UB bathing with min assist with max assist needed for washing the LUE using the RUE.  Min to mod assist for donning pullover shirt as well following hemi dressing technique.  She was able to complete  Washing her LB with max assist sit to stand.  Max assist for lifting the RLE over the left knee for washing her foot and donning her clothing.  She was then able to donn items on the left side, but needed max assist for standing to pull items over her hips.  Finished session with pt in the wheelchair and with safety belt and call button in reach.     Therapy Documentation Precautions:  Precautions Precautions: Fall Precaution Comments: global aphasic, residual R hemiplegia, urostomy Restrictions Weight Bearing Restrictions: No   Pain: Pain Assessment Pain Scale: 0-10 Pain Score: 0-No pain ADL: See Care Tool Section for some details of mobility and  selfcare  Therapy/Group: Individual Therapy  Zan Triska OTR/L 12/19/2020, 12:42 PM

## 2020-12-19 NOTE — Progress Notes (Signed)
Speech Language Pathology Daily Session Note  Patient Details  Name: Devonne Kitchen MRN: [id number] Date of Birth: [birth year]  Today's Date: 12/19/2020 SLP Individual Time: 7902-4097 SLP Individual Time Calculation (min): 30 min  Short Term Goals: Week 1: SLP Short Term Goal 1 (Week 1): Patient will follow basic commands with 90% accuracy and Min A multimodal cues. SLP Short Term Goal 2 (Week 1): Patient will answer mildly complex yes/no questions with 75% accuracy with Min A multimodal cues. SLP Short Term Goal 3 (Week 1): Patient will utilize mulitmodal communication to express wants/needs with Mod A multimodal cues. SLP Short Term Goal 4 (Week 1): Patient will demonstrate functional problem solving for basic and familiar tasks with Mod A verbal cues. SLP Short Term Goal 5 (Week 1): Patient will demonstrate selective attention to functional tasks in a mildly distracting enviornment for ~45 minutes with Min verbal cues for redirection.  Skilled Therapeutic Interventions: Skilled SLP intervention focused on communication. Pt completed selective attention card sorting task of moderate complexity. She completed  entire task ~12-14 min with supervision cues. She identified photos accurately on communication board in room but nodded head no when asked if she uses this board in her room. She identified single words in a field of 3 with min A and responded to mildly complex yes/no questions with min A verbal cues including repetition and visual cues. Cont with therapy per plan of care.      Pain Pain Assessment Pain Scale: Faces Faces Pain Scale: No hurt  Therapy/Group: Individual Therapy  Darrol Poke Ekansh Sherk 12/19/2020, 1:35 PM

## 2020-12-19 NOTE — Progress Notes (Signed)
Physical Therapy Session Note  Patient Details  Name: Melanie Madden MRN: [id number] Date of Birth: [birth year]  Today's Date: 12/19/2020 PT Individual Time: 1100-1155; 1445-1530  PT Individual Time Calculation (min): 55 min and 45 mins  Short Term Goals: Week 1:  PT Short Term Goal 1 (Week 1): Pt will perform bed mobility with mod assist PT Short Term Goal 2 (Week 1): Pt will preformed bed<>WC transfer with mod assist PT Short Term Goal 3 (Week 1): Pt will perform gait training with LRAD for 4ft with mod assit and +2 for safety  Skilled Therapeutic Interventions/Progress Updates:    Session 1: Patient received sitting up in wc, appearing distressed, but not able to communicate her needs. Through a series of yes/no questions, PT able to deduce that patient had to use the bathroom. Patient did not press call light to inform nursing of this. She did deny pain. Patient refusing to allow PT to don gait belt to safely assist to toilet. MaxA stand pivot to toilet with 2nd person to assist with clothing management in standing. Patient continent of bowel and bladder requiring TotalA perihygiene in standing. MaxA stand pivot back to wc. PT emphasizing use of R AFO for transfers with NT who was present- also written on safety plan now. Patient able to propel herself in wc hemi technique with supervision and verbal cues. Patient with difficulty following simple commands, such as turning right vs left. MaxA stand pivot to therapy mat. Sit <> stand with ModA + quad cane and mirror for visual feedback. Patient with strong preference to weight bear through L LE with very limited weight shift R. PT attempting to facilitate weight shift R to complete pre-gait exercises stepping L LE to target. Due to poor weight shift R, patient unable to achieve adequate step with L. PT blocking R knee. With use of R AFO, patient still with strong moment toward hyperextension in R. Patient often sitting abruptly and not  able to complete consistent sets of this exercise. Patient completing Kinetron at 80 cm/s with B LE. Patient with good activation of R extensors on Kinetron pedal, but would fatigue quickly. Patient remaining up in wc, seatbelt alarm on, call light within reach.   Session 2: Patient received sitting up in wc, RN present providing rx. Patient denying pain and indicating that she needed to use the bathroom. Patient able to propel herself into bathroom with MinA and transfer to toilet via stand pivot with MaxA and RN present to assist with clothing management. Patient continent of bladder. Upon returning to room, patient gesturing in a manner that had PT and RN thinking patient may need emesis bag. She denied nausea. Then patient gesturing in a manner like she needed a drink. Patient saying "no" when asked if she was thirsty/needed a drink. PT using communication board and patient pointing to toilet. PT emphasizing to patient that she had just used toilet and asked if she needed to go again/have a bowel movement. Patient shook her head no. PT asking patient if she was uncomfortable having males Investment banker, corporate) assist her in the bathroom- she shook her head no. Patient repeatedly gesturing toward toilet picture on communication board. PT asking if patient didn't feel clean and wanted to clean up more- patient said "yes." PT positioning patient in front of sink to complete standing perihygiene. Upon standing, PT notified patient that PT would be doffing pants- patient became very upset and sat abruptly into wc. She then took the wet washcloth and simply washed  her face. PT asked if patient felt better and she nodded her head yes. PT transporting patient to therapy gym. MaxA squat pivot to wc with strong L lateral lean and generally poor mechanics of transfer. PT adjusted ROHO cushion to allow for increased comfort and pressure relief. Patient transferring back to wc via squat pivot with MaxA and propelled herself in wc to her  room with supervision and verbal cues. Patient indicating that she wanted to return to bed. MaxA squat pivot to bed, ModA to return supine. Bed alarm on, call light within reach.   Therapy Documentation Precautions:  Precautions Precaution Comments: global aphasic, residual R hemiplegia, urostomy Restrictions Weight Bearing Restrictions: No    Therapy/Group: Individual Therapy  Karoline Caldwell, PT, DPT, CBIS  12/19/2020, 7:50 AM

## 2020-12-19 NOTE — IPOC Note (Signed)
Overall Plan of Care [facility]) Patient Details Name: Melanie Madden MRN: [id number] DOB: 11-Jul-1966  Admitting Diagnosis: [facility] Problems: Principal Problem:   Debility Active Problems:   Hyponatremia   Urinary retention   E. coli UTI     Functional Problem List: Nursing Bladder,Bowel,Endurance,Safety,Medication Management  PT Balance,Behavior,Endurance,Motor,Perception,Safety,Sensory,Skin Integrity  OT Safety,Balance,Cognition,Edema,Endurance,Motor,Pain,Perception,Sensory,Behavior  SLP Cognition,Linguistic  TR         Basic ADL's: OT Grooming,Bathing,Dressing,Toileting     Advanced  ADL's: OT       Transfers: PT Bed Mobility,Bed to [place]  OT Toilet,Tub/Shower     Locomotion: PT Ambulation,Wheelchair Mobility,Stairs     Additional Impairments: OT    SLP Social Cognition,Communication expression,comprehension Problem Solving,Attention,Memory,Awareness  TR      Anticipated Outcomes Item Anticipated Outcome  Self Feeding S  Swallowing  N/A   Basic self-care  MIN  Toileting  MIN   Bathroom Transfers MIN  Bowel/Bladder  Manage bladder and bladder with mod I assist  Transfers  Min assist with LRAD  Locomotion  Mod assist gait with LRAD and supervision assist WC mobility  Communication  Min A via multimodal communication  Cognition  Supervision  Pain  n/a  Safety/Judgment  maintain safety with cues/reminders   Therapy Plan: PT Intensity: Minimum of 1-2 x/day ,45 to 90 minutes PT Frequency: 5 out of 7 days PT Duration Estimated Length of Stay: 14-17 days OT Intensity: Minimum of 1-2 x/day, 45 to 90 minutes OT Frequency: 5 out of 7 days OT Duration/Estimated Length of Stay: 12-14 SLP Intensity: Minumum of 1-2 x/day, 30 to 90 minutes SLP Frequency: 3 to 5 out of 7 days SLP Duration/Estimated Length of Stay: 12-14 days   Due to the current state of emergency, patients may not be receiving their 3-hours of  Medicare-mandated therapy.   Team Interventions: Nursing Interventions Patient/Family Education,Bowel Management,Medication Management,Disease Management/Prevention,Bladder [place]  PT interventions Ambulation/gait training,Community Corporate treasurer re-education,Psychosocial support,Stair training,UE/LE Strength taining/ROM,Wheelchair propulsion/positioning,Balance/vestibular training,Discharge planning,Functional electrical stimulation,Pain management,Skin care/wound management,Therapeutic Activities,UE/LE Coordination activities,Cognitive remediation/compensation,Disease management/prevention,Functional mobility training,Patient/family education,Splinting/orthotics,Therapeutic Exercise,Visual/perceptual remediation/compensation  OT Interventions Balance/vestibular training,Discharge planning,Functional electrical stimulation,Pain management,Self Care/advanced ADL retraining,Therapeutic Activities,UE/LE Coordination activities,Visual/perceptual remediation/compensation,Therapeutic Exercise,Skin care/wound managment,Patient/family education,Functional mobility training,Disease mangement/prevention,Cognitive remediation/compensation,Community Corporate treasurer re-education,Psychosocial support,Splinting/orthotics,UE/LE Strength taining/ROM,Wheelchair propulsion/positioning  SLP Interventions Cognitive remediation/compensation,Internal/external aids,Speech/Language facilitation,Cueing hierarchy,Environmental controls,Therapeutic Activities,Functional tasks,Patient/family education  TR Interventions    SW/CM Interventions     Barriers to Discharge MD  Medical stability, Behavior and severe aphasia/apraxia, 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  Nursing Decreased caregiver support,Home environment access/layout home wtih spouse and care attendant, 2 level 3 ste B/B on main with chair lift up,  PT Inaccessible  home environment,Home environment access/layout,Insurance for SNF coverage,Pending surgery,Behavior,Wound Care,Incontinence    OT      SLP      SW       Team Discharge Planning: Destination: PT-Home ,OT- Home , SLP-Home Projected Follow-up: PT-Home health PT, OT-  Home health OT, SLP-Outpatient SLP,24 hour supervision/assistance Projected Equipment Needs: PT-To be determined, OT-  , SLP-None recommended by SLP Equipment Details: PT- , OT-pt has equiment from last admission Patient/family involved in discharge planning: PT- Patient,  OT-Patient unable/family or caregiver not available, SLP-Patient  MD ELOS:  Medical Rehab Prognosis:  Good Assessment:  55 year old RH-female with history of HTN, 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 She was noted to be hypotensive and tachycardic due to  sepsis shock secondary to emphysematous pyelitis and AKI due to obstructive uropathy. She was treated with fluids, pressors, broad spectrum antibiotics and right nephrostomy tube placed by interventional radiology.  Blood cultures 2/4 were positive for Enterobacterales and E. Coli. Dr. Unknown Foley consulted and recommended supportive care with outpatient follow up for stone extraction in 2-3 weeks, once infection cleared. AKI resolving with downward trend in SCr but did have rise in WBCs from 19.6 on 05/19 therefore urine from urostomy cultured and showed not growth. She has had issues with intermittent hypokalemia which has been supplemented, hypoglycemia felt to be due to poor intake as well as thrombocytopenia due to sepsis. Therapy initiated and patient with resulting functional deficits with cognition, weakness, self-care. At baseline, patient had a caregiver during the day but was able to stand and take a few steps?  Patient noted to be deconditioned with decline in functional  status. CIR recommended due to functional decline.    Now requiring 24/7 Rehab RN,MD, as well as CIR level PT, OT and SLP.  Treatment team will focus on ADLs and mobility with goals set at [facility]  See Team Conference Notes for weekly updates to the plan of care

## 2020-12-19 NOTE — Progress Notes (Signed)
[facility] PHYSICAL MEDICINE & REHABILITATION PROGRESS NOTE  Subjective/Complaints: Pt known to me from prior admit and OP clinic  ROS: Denies CP, SOB, N/V/D  Objective: Vital Signs: Blood pressure 119/67, pulse 86, temperature 97.9 F (36.6 C), resp. rate 18, weight 62.5 kg, SpO2 97 %. No results found. Recent Labs    12/17/20 0503 12/19/20 0625  WBC 14.0* 9.7  HGB 10.8* 10.9*  HCT 31.8* 32.7*  PLT 151 287   Recent Labs    12/17/20 0503 12/19/20 0625  NA 133* 133*  K 4.8 4.1  CL 102 103  CO2 21* 23  GLUCOSE 122* 121*  BUN 34* 35*  CREATININE 1.00 1.01*  CALCIUM 9.0 9.0    Intake/Output Summary (Last 24 hours) at 12/19/2020 0829 Last data filed at 12/19/2020 0600 Gross per 24 hour  Intake 840 ml  Output 1600 ml  Net -760 ml        Physical Exam: BP 119/67 (BP Location: Left Arm)   Pulse 86   Temp 97.9 F (36.6 C)   Resp 18   Wt 62.5 kg   SpO2 97%   BMI 21.58 kg/m   General: No acute distress Mood and affect are appropriate Heart: Regular rate and rhythm no rubs murmurs or extra sounds Lungs: Clear to auscultation, breathing unlabored, no rales or wheezes Abdomen: Positive bowel sounds, soft nontender to palpation, nondistended Extremities: No clubbing, cyanosis, or edema Skin: No evidence of breakdown, no evidence of rash  GU: + Nephrostomy drain Skin: Warm and dry.  Intact. Psych: Flat.  Limited engagement. Musc: RUE/RLE: With mild edema, stable.  No tenderness in extremities. Neuro: Alert Global aphasia, stable Motor: Limited to participation, no movement noted on right side   Assessment/Plan: 1. Functional deficits which require 3+ hours per day of interdisciplinary therapy in a comprehensive inpatient rehab setting.  Physiatrist is providing close team supervision and 24 hour management of active medical problems listed below.  Physiatrist and rehab team continue to assess barriers to discharge/monitor patient progress toward functional  and medical goals   Care Tool:  Bathing  Bathing activity did not occur: Refused Body parts bathed by patient: Right arm,Chest,Abdomen,Front perineal area,Right upper leg,Left upper leg,Left lower leg,Face   Body parts bathed by helper: Left arm,Buttocks,Right lower leg     Bathing assist Assist Level: Maximal Assistance - Patient 24 - 49%     Upper Body Dressing/Undressing Upper body dressing   What is the patient wearing?: Pull over shirt    Upper body assist Assist Level: Minimal Assistance - Patient > 75%    Lower Body Dressing/Undressing Lower body dressing      What is the patient wearing?: Incontinence brief     Lower body assist Assist for lower body dressing: Maximal Assistance - Patient 25 - 49%     Toileting Toileting    Toileting assist Assist for toileting: Total Assistance - Patient < 25%     Transfers Chair/bed transfer  Transfers assist     Chair/bed transfer assist level: Maximal Assistance - Patient 25 - 49%     Locomotion Ambulation   Ambulation assist   Ambulation activity did not occur: Safety/medical concerns          Walk 10 feet activity   Assist  Walk 10 feet activity did not occur: Safety/medical concerns        Walk 50 feet activity   Assist Walk 50 feet with 2 turns activity did not occur: Safety/medical concerns  Walk 150 feet activity   Assist Walk 150 feet activity did not occur: Safety/medical concerns         Walk 10 feet on uneven surface  activity   Assist Walk 10 feet on uneven surfaces activity did not occur: Safety/medical concerns         Wheelchair     Assist   Type of Wheelchair: Manual    Wheelchair assist level: Minimal Assistance - Patient > 75% Max wheelchair distance: 150    Wheelchair 50 feet with 2 turns activity    Assist        Assist Level: Minimal Assistance - Patient > 75%   Wheelchair 150 feet activity     Assist      Assist  Level: Minimal Assistance - Patient > 75%    Medical Problem List and Plan: 1.  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  Continue CIR PT, OT, SLP Baseline is severe expressive aphasia/apraxia Able to amb 500" SBQC with supervision per last OP PT note 5/10 2.  Antithrombotics: -DVT/anticoagulation:  Mechanical: Sequential compression devices, below knee Bilateral lower extremities             -antiplatelet therapy: N/A 3. 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   Appears to be controlled on 5/22  Monitor with increased exertion 4. Mood: LCSW to follow for evaluation and support.              -antipsychotic agents: N/A 5. Neuropsych: This patient is not capable of making decisions on her own behalf. 6. Skin/Wound Care: Routine pressure relief measures.  7. Fluids/Electrolytes/Nutrition: Monitor I/Os.  8. E.coli UTI: antibiotics narrowed to IV ceftriaxone, discussed with pharmacy changed to Keflex--Day #9/14 .  9. Left emphysematous pyelitis s/p nephrostomy: Continue to monitor output dily.              --Radiology following for input.   Foley DC'd, voiding trial 10. Leucocytosis:  .               resolved on Keflex  Afebrile  Monitor for signs and symptoms of infection 11. Thrombocytopenia:   Platelets 151 on 5/21, improving  12. Abnormal LFTs:              LFTs elevated on 5/21  Continue to monitor 13. Acute kidney injury: resolved, obstructive uropathy, s/p ureteral stent per IR   Creatinine 1.0 on 5/21  Continue to monitor 14. HTN:  Hypotension has resolved. Continue to hold norvasc.    Vitals:   12/18/20 2300 12/19/20 0337  BP:  119/67  Pulse:  86  Resp:  18  Temp: 98.8 F (37.1 C) 97.9 F (36.6 C)  SpO2:  97%               Monitor his increase mobility 15.  Adjustment disorder: Managed on Seroquel bid. 16. Stress induced hyperglycemia: Hgb A1C-5.2 but BS labile             --Continue to monitor with ac/hs CBG checks.            slightly elevated on 5/22, monitor for now  Monitor with increased mobility 17.  Hyponatremia  Sodium 133 on 5/21  Continue to monitor  LOS: 3 days A FACE TO  [employer] EVALUATION WAS PERFORMED  Charlett Blake 12/19/2020, 8:29 AM

## 2020-12-20 ENCOUNTER — Other Ambulatory Visit: Payer: Self-pay

## 2020-12-20 DIAGNOSIS — R5381 Other malaise: Secondary | ICD-10-CM | POA: Diagnosis not present

## 2020-12-20 LAB — URINE CULTURE: Culture: <10000 — AB

## 2020-12-20 LAB — GLUCOSE, CAPILLARY
Glucose-Capillary: 121 mg/dL — ABNORMAL HIGH (ref 70–99)
Glucose-Capillary: 100 mg/dL — ABNORMAL HIGH (ref 70–99)
Glucose-Capillary: 105 mg/dL — ABNORMAL HIGH (ref 70–99)
Glucose-Capillary: 140 mg/dL — ABNORMAL HIGH (ref 70–99)

## 2020-12-20 NOTE — Progress Notes (Signed)
Physical Therapy Session Note  Patient Details  Name: Melanie Madden MRN: [id number] Date of Birth: 03-11-1966  Today's Date: 12/20/2020 PT Missed Time: 45 Minutes Missed Time Reason: Patient unwilling to participate  Therapist arrived to patient's room and upon entry and introduction of self patient shook head "no" and pointed to her wrist indicating that therapist had come at a different time. Therapist attempted to explain to patient that scheduling changes occurred and that she will still receive allotted therapy time; however, patient continued to refuse to participate at this time. Therapist offered to return at a later time but pt continued to shake head no and then pointed to the door for therapist to leave. Pt left sitting in w/c with needs in reach and seat belt alarm on. Missed 45 minutes of skilled physical therapy.  Tawana Scale , PT, DPT, CSRS  12/20/2020, 12:39 PM

## 2020-12-20 NOTE — Progress Notes (Signed)
Occupational Therapy Session Note  Patient Details  Name: Melanie Madden MRN: [id number] Date of Birth: [birth year]  Today's Date: 12/20/2020 OT Individual Time: 4098-1191 OT Individual Time Calculation (min): 56 min    Short Term Goals: Week 1:  OT Short Term Goal 1 (Week 1): Pt will transfer to toilet wit MOD A and LRAD consistently OT Short Term Goal 2 (Week 1): Pt will dons shirt wiht S OT Short Term Goal 3 (Week 1): Pt will thread BLE wiht S  Skilled Therapeutic Interventions/Progress Updates:    Session 1( 4782-9562)  Pt in wheelchair to start session with therapist transporting her down to the therapy gym via wheelchair.  She then completed squat pivot transfer to the therapy mat.  Mod demonstrational cueing throughout mat work to maintain upright posture and anterior pelvic tilt.  She would continually fall into posterior pelvic tilt while sitting and flex her head.  Worked on functional reaching to the right in sitting for increased weightbearing through the RUE while reaching with the left.  Worked on transitions sit to squat for incorporating increased weightbearing through the RLE as well with overall max assist.  Elevated mat to assist with more midline orientation from starting position in sitting, as pt leans heavily to the left side during sit to stand.  Mod facilitation at the trunk for more weightshift to the right with max assist to complete sit to stand in this more aligned manor.  Increased trunk flexion noted in standing with max facilitation on the right knee to keep it from buckling or hyperextending.  She also worked on pre-gait activity by weightbearing through the RLE while stepping forward and backwards with the LLE.  Max assist needed for facilitation of weightshift and for stabilizing the RLE.  Finished session with mod assist stand pivot transfer back to the wheelchair and return to the room.  She was left sitting up with the call button and phone in reach  and safety belt in place.    Session 2: (1332-1400)  Pt in wheelchair to start session.  Worked on standing at the sink for increased weightbearing through the RUE and RLE.  She demonstrates increased weightshift to the left with sit to stand as well as standing.  Worked on simple weighshifts in standing to the right with mod to max facilitation in the right knee to avoid buckling or hyperextension.  She continues to demonstrate decreased ability to maintain upright posture in the trunk and head while standing, requiring mod facilitation.  Transitioned to working on functional mobility with use of the quad cane and overall mod assist for balance.  Increased trunk flexion noted with decreased weightshift to the right for adequate step length on the left.  Completed functional mobility without assistive device and max assist as well secondary to trying to advance the LLE before the RLE was ready to support weight.  Finished session with pt in the wheelchair and with the call button and phone in reach and safety belt in place.  Therapy Documentation Precautions:  Precautions Precautions: Fall Precaution Comments: global aphasic, residual R hemiplegia, urostomy Restrictions Weight Bearing Restrictions: No  Pain: Pain Assessment Pain Scale: 0-10 Faces Pain Scale: No hurt ADL: See Care Tool Section for some details of mobility and selfcare  Therapy/Group: Individual Therapy  Jhania Etherington OTR/L 12/20/2020, 1:25 PM

## 2020-12-20 NOTE — Progress Notes (Signed)
Physical Therapy Session Note  Patient Details  Name: Melanie Madden MRN: [id number] Date of Birth: 26-Oct-1965  Today's Date: 12/20/2020 PT Individual Time: 0900-0955 PT Individual Time Calculation (min): 55 min   Short Term Goals: Week 1:  PT Short Term Goal 1 (Week 1): Pt will perform bed mobility with mod assist PT Short Term Goal 2 (Week 1): Pt will preformed bed<>WC transfer with mod assist PT Short Term Goal 3 (Week 1): Pt will perform gait training with LRAD for 51ft with mod assit and +2 for safety  Skilled Therapeutic Interventions/Progress Updates:   Patient received reclined in bed, appearing distressed, agreeable to PT assist. She indicates that she does not have pain. Patient indicating that her brief needed to be changed. PT asking patient if she had pressed call light to alert nursing to episode of incontinence and she gestured in a manner that made PT believe she did not know how to use call light. PT orienting patient to use of call light and importance of not remaining in soiled brief for extended period of time for skin integrity/hygiene. Bed level pericare completed with patient able to wash front. CGA for rolling using bed rails. ModA to don pants at bedlevel. TotalA for TED hose. She was able to come sit edge of bed with CGA to manage R LE. Patient insisting on donning shoes herself and was able to do so with supervision and extended time. Patient continues to refuse use of gait belt to allow for proper guarding/assistance. PT providing grossly ModA for squat pivot to wc. Patient swatting PTs arms away during transfer and motioning to her pants. PT educating patient that PT must provide physical assist to prevent patient from falling/ensure safe transfer. She was able to propel herself in wc to therapy gym using hemi technique and supervision. MaxA squat pivot to therapy mat, with patient remaining resistant to assist during transfers. Pre gait exercises completed  stepping to dot with up to MaxA to facilitate weight shift R to allow for full step with L LE. Patient remains with heavy reliance on SBQC on L to the point where the cane is beginning to lose its stability. PT attempting to educate patient on importance of weight shift R with R knee blocked (and use of R AFO) using multimodal cues, however patient did not seem responsive to education. Patient progressing to pre gait stepping L LE onto 4" box to encourage longer/stronger weight shift R. Patient not responsive to multi modal cues to leave L LE on top of 4" step and merely tapped her toe resulting in a very quick weight shift R and collapse back onto L LE. Patient transferring back to wc via squat pivot, but began to fall to the L, her strong side, PT providing assist at hips using pants to prevent fall out of chair and patient smacked PT in side of head/neck. PT emphasizing to patient that that's not appropriate and PT is there to assist patient as needed. Patient returning to room in wc, seatbelt alarm on, call light within reach.    Therapy Documentation Precautions:  Precautions Precautions: Fall Precaution Comments: global aphasic, residual R hemiplegia, urostomy Restrictions Weight Bearing Restrictions: No    Therapy/Group: Individual Therapy  Karoline Caldwell, PT, DPT, CBIS  12/20/2020, 7:36 AM

## 2020-12-20 NOTE — Progress Notes (Signed)
Inpatient [facility] Individual Statement of Services  Patient Name:  Melanie Madden [name]  Date:  12/20/2020  Welcome to the [facility].  Our goal is to provide you with an individualized program based on your diagnosis and situation, designed to meet your specific needs.  With this comprehensive rehabilitation program, you will be expected to participate in at least 3 hours of rehabilitation therapies Monday-Friday, with modified therapy programming on the weekends.  Your rehabilitation program will include the following services:  Physical Therapy (PT), Occupational Therapy (OT), Speech Therapy (ST), 24 hour per day rehabilitation nursing, Therapeutic Recreaction (TR), Neuropsychology, Care Coordinator, Rehabilitation Medicine, Nutrition Services, Pharmacy Services and Other  Weekly team conferences will be held on Wednesdays to discuss your progress.  Your Inpatient Rehabilitation Care Coordinator will talk with you frequently to get your input and to update you on team discussions.  Team conferences with you and your family in attendance may also be held.  Expected length of stay: 12-14 Days  Overall anticipated outcome: Supervision to Min A  Depending on your progress and recovery, your program may change. Your Inpatient Rehabilitation Care Coordinator will coordinate services and will keep you informed of any changes. Your Inpatient Rehabilitation Care Coordinator's name and contact numbers are listed  below.  The following services may also be recommended but are not provided by the [facility]:    [place] will be made to provide these services after discharge if needed.  Arrangements include referral to agencies that provide these services.  Your insurance has been verified to be:  [place] primary doctor is:  Rachell Cipro,  MD  Pertinent information will be shared with your doctor and your insurance company.  Inpatient Rehabilitation Care Coordinator:  Erlene Quan, [place] or [phone]  Information discussed with and copy given to patient by: Dyanne Iha, 12/20/2020, 12:45 PM

## 2020-12-20 NOTE — Progress Notes (Signed)
[facility] PHYSICAL MEDICINE & REHABILITATION PROGRESS NOTE  Subjective/Complaints: I 628ml yesterday , discussed need to increase fluid intake  With pt and RN  ROS: Denies CP, SOB, N/V/D  Objective: Vital Signs: Blood pressure 111/68, pulse 76, temperature 98.9 F (37.2 C), temperature source Oral, resp. rate 20, weight 67 kg, SpO2 100 %. No results found. Recent Labs    12/19/20 0625  WBC 9.7  HGB 10.9*  HCT 32.7*  PLT 287   Recent Labs    12/19/20 0625  NA 133*  K 4.1  CL 103  CO2 23  GLUCOSE 121*  BUN 35*  CREATININE 1.01*  CALCIUM 9.0    Intake/Output Summary (Last 24 hours) at 12/20/2020 4008 Last data filed at 12/19/2020 1808 Gross per 24 hour  Intake 600 ml  Output 711 ml  Net -111 ml        Physical Exam: BP 111/68 (BP Location: Right Arm)   Pulse 76   Temp 98.9 F (37.2 C) (Oral)   Resp 20   Wt 67 kg   SpO2 100%   BMI 23.13 kg/m    General: No acute distress Mood and affect are appropriate Heart: Regular rate and rhythm no rubs murmurs or extra sounds Lungs: Clear to auscultation, breathing unlabored, no rales or wheezes Abdomen: Positive bowel sounds, soft nontender to palpation, nondistended Extremities: No clubbing, cyanosis, or edema Skin: No evidence of breakdown, no evidence of ra  GU: + Nephrostomy drain Skin: Warm and dry.  Intact. Psych: Flat.  Limited engagement. Musc: RUE/RLE: With mild edema, stable.  No tenderness in extremities. Neuro: Alert Global aphasia, stable Motor: 0/5 RUE, 3- R Quad, and HF, 0 at ankle/foot    Assessment/Plan: 1. Functional deficits which require 3+ hours per day of interdisciplinary therapy in a comprehensive inpatient rehab setting.  Physiatrist is providing close team supervision and 24 hour management of active medical problems listed below.  Physiatrist and rehab team continue to assess barriers to discharge/monitor patient progress toward functional and medical goals   Care  Tool:  Bathing  Bathing activity did not occur: Refused Body parts bathed by patient: Chest,Abdomen,Right upper leg,Left upper leg,Left lower leg,Face,Right arm   Body parts bathed by helper: Right lower leg,Left arm,Front perineal area,Buttocks     Bathing assist Assist Level: Maximal Assistance - Patient 24 - 49%     Upper Body Dressing/Undressing Upper body dressing   What is the patient wearing?: Pull over shirt    Upper body assist Assist Level: Minimal Assistance - Patient > 75%    Lower Body Dressing/Undressing Lower body dressing      What is the patient wearing?: Incontinence brief,Pants     Lower body assist Assist for lower body dressing: Maximal Assistance - Patient 25 - 49%     Toileting Toileting    Toileting assist Assist for toileting: Total Assistance - Patient < 25%     Transfers Chair/bed transfer  Transfers assist     Chair/bed transfer assist level: Maximal Assistance - Patient 25 - 49%     Locomotion Ambulation   Ambulation assist   Ambulation activity did not occur: Safety/medical concerns          Walk 10 feet activity   Assist  Walk 10 feet activity did not occur: Safety/medical concerns        Walk 50 feet activity   Assist Walk 50 feet with 2 turns activity did not occur: Safety/medical concerns  Walk 150 feet activity   Assist Walk 150 feet activity did not occur: Safety/medical concerns         Walk 10 feet on uneven surface  activity   Assist Walk 10 feet on uneven surfaces activity did not occur: Safety/medical concerns         Wheelchair     Assist Will patient use wheelchair at discharge?: Yes Type of Wheelchair: Manual    Wheelchair assist level: Supervision/Verbal cueing Max wheelchair distance: 150    Wheelchair 50 feet with 2 turns activity    Assist        Assist Level: Supervision/Verbal cueing   Wheelchair 150 feet activity     Assist      Assist  Level: Supervision/Verbal cueing    Medical Problem List and Plan: 1.  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  Continue CIR PT, OT, SLP Baseline is severe expressive aphasia/apraxia Able to amb 500' SBQC with supervision per last OP PT note 5/10 2.  Antithrombotics: -DVT/anticoagulation:  Mechanical: Sequential compression devices, below knee Bilateral lower extremities             -antiplatelet therapy: N/A 3. 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   Appears to be controlled on 5/22  Monitor with increased exertion 4. Mood: LCSW to follow for evaluation and support.              -antipsychotic agents: N/A 5. Neuropsych: This patient is not capable of making decisions on her own behalf. 6. Skin/Wound Care: Routine pressure relief measures.  7. Fluids/Electrolytes/Nutrition: Monitor I/Os.  8. E.coli UTI: antibiotics narrowed to IV ceftriaxone, discussed with pharmacy changed to Keflex--Day #9/14 .  9. Left emphysematous pyelitis s/p nephrostomy: Continue to monitor output dily.              --Radiology following for input.   Foley DC'd, voiding trial 10. Leucocytosis:  .               resolved on Keflex  Afebrile  Monitor for signs and symptoms of infection 11. Thrombocytopenia:   Platelets 151 on 5/21, improving  12. Abnormal LFTs:              LFTs elevated on 5/21  Continue to monitor 13. Acute kidney injury: resolved, obstructive uropathy, s/p ureteral stent per IR   Creatinine 1.0 on 5/21  Continue to monitor 14. HTN:  Hypotension has resolved. Continue to hold norvasc.    Vitals:   12/19/20 2025 12/20/20 0322  BP: 128/83 111/68  Pulse: 97 76  Resp: 20 20  Temp: 98.6 F (37 C) 98.9 F (37.2 C)  SpO2: 98% 100%               Monitor his increase  mobility 15. Adjustment disorder: Managed on Seroquel bid. 16. Stress induced hyperglycemia: Hgb A1C-5.2 but BS labile             --Continue to monitor with ac/hs CBG checks.            slightly elevated on 5/22, monitor for now  Monitor with increased mobility 17.  Hyponatremia  Sodium 133 on 5/21  Continue to monitor  LOS: 4 days A FACE TO FACE EVALUATION WAS PERFORMED  [name]  Ellison Leisure 12/20/2020, 8:22 AM

## 2020-12-20 NOTE — Progress Notes (Signed)
Patient information reviewed and entered into eRehab System by Becky Nino Amano, PPS coordinator. Information including medical coding, function ability, and quality indicators will be reviewed and updated through discharge.   

## 2020-12-21 LAB — GLUCOSE, CAPILLARY
Glucose-Capillary: 112 mg/dL — ABNORMAL HIGH (ref 70–99)
Glucose-Capillary: 78 mg/dL (ref 70–99)
Glucose-Capillary: 139 mg/dL — ABNORMAL HIGH (ref 70–99)
Glucose-Capillary: 143 mg/dL — ABNORMAL HIGH (ref 70–99)

## 2020-12-21 NOTE — Progress Notes (Signed)
Patient ID: Melanie Madden, female   DOB: 02-Oct-1965, 55 y.o.   MRN: [id number] Team Conference Report to Patient/Family  Team Conference discussion was reviewed with the patient and caregiver, including goals, any changes in plan of care and target discharge date.  Patient and caregiver express understanding and are in agreement.  The patient has a target discharge date of 12/27/20.   SW met with pt and spouse, provided conference updates. Family education scheduled Friday 9-11AM Dyanne Iha 12/21/2020, 11:32 AM

## 2020-12-21 NOTE — Progress Notes (Signed)
Occupational Therapy Session Note  Patient Details  Name: Melanie Madden MRN: [id number] Date of Birth: 1965-12-31  Today's Date: 12/21/2020 OT Individual Time: 0802-0902 OT Individual Time Calculation (min): 60 min    Short Term Goals: Week 1:  OT Short Term Goal 1 (Week 1): Pt will transfer to toilet wit MOD A and LRAD consistently OT Short Term Goal 2 (Week 1): Pt will dons shirt wiht S OT Short Term Goal 3 (Week 1): Pt will thread BLE wiht S  Skilled Therapeutic Interventions/Progress Updates:    Pt in bed to start session, with report of needing to be changed secondary to pointing to her brief.  She was able to transfer to sitting from supine with mod assist and then completed squat pivot transfer to the wheelchair at [facility] assist as well.  Worked on bathing and dressing sit to stand at the sink with min assist for UB bathing and dressing.  Mod assist for LB bathing secondary to sit to stand.  She was able to self manage her RLE crossing it over for washing and donning most LB clothing at min assist level.  Mod assist for sit to stand with increased midline and upright alignment during transition.  She was able to donn her right shoe and AFO with mod assist after therapist provided total assist with TEDs.  She was able to donn the left shoe with setup.  Finished session with pt in the wheelchair with the call button and phone in reach.    Therapy Documentation Precautions:  Precautions Precautions: Fall Precaution Comments: global aphasic, residual R hemiplegia, urostomy Restrictions Weight Bearing Restrictions: No Pain: Pain Assessment Pain Scale: Faces Pain Score: 0-No pain ADL: See Care Tool Section for some details of mobility and selfcare  Therapy/Group: Individual Therapy  Cherri Yera OTR/L 12/21/2020, 10:37 AM

## 2020-12-21 NOTE — Progress Notes (Signed)
Speech Language Pathology Daily Session Note  Patient Details  Name: Melanie Madden MRN: [id number] Date of Birth: 04-13-1966  Today's Date: 12/21/2020 SLP Individual Time: 4034-7425 SLP Individual Time Calculation (min): 43 min  Short Term Goals: Week 1: SLP Short Term Goal 1 (Week 1): Patient will follow basic commands with 90% accuracy and Min A multimodal cues. SLP Short Term Goal 2 (Week 1): Patient will answer mildly complex yes/no questions with 75% accuracy with Min A multimodal cues. SLP Short Term Goal 3 (Week 1): Patient will utilize mulitmodal communication to express wants/needs with Mod A multimodal cues. SLP Short Term Goal 4 (Week 1): Patient will demonstrate functional problem solving for basic and familiar tasks with Mod A verbal cues. SLP Short Term Goal 5 (Week 1): Patient will demonstrate selective attention to functional tasks in a mildly distracting enviornment for ~45 minutes with Min verbal cues for redirection.  Skilled Therapeutic Interventions: Skilled ST services focused on communication skills and education. SLP communicated with pt's husband [name] via phone, he supported inconsistent verbal response to yes/no questions, favoring "no" response, ability to read at word level and working on repeating phonemes. Pt was able to identify 5 out 6 black/white ADL pictures (communication board) in a field 6, increasing to 6 out 6 with supervision A demonstration cue. Pt was able to select written "yes" and "no" on the back of the board in response to basic biographical questions in 4 out 4 opportunities. Pt was able to selected requested word from a field of 3 in 2 out 2 opportunities. SLP educated nurse, NT and treating PT to utilize 6 cell communication board, with yes/no written clarification (on back of the board) and use of 1 written word on personal white board. SLP recommends to continue carryover of communication system. Pt was left with call bell within reach  and bed alarm set. Recommend to continue ST services.      Pain Pain Assessment Pain Score: 0-No pain  Therapy/Group: Individual Therapy  Fletcher Ostermiller  [facility] 12/21/2020, 4:03 PM

## 2020-12-21 NOTE — Progress Notes (Signed)
Physical Therapy Session Note  Patient Details  Name: Melanie Madden MRN: [id number] Date of Birth: [birth year]  Today's Date: 12/21/2020 PT Individual Time: 1415-1525 PT Individual Time Calculation (min): 70 min   Short Term Goals: Week 1:  PT Short Term Goal 1 (Week 1): Pt will perform bed mobility with mod assist PT Short Term Goal 2 (Week 1): Pt will preformed bed<>WC transfer with mod assist PT Short Term Goal 3 (Week 1): Pt will perform gait training with LRAD for 53ft with mod assit and +2 for safety  Skilled Therapeutic Interventions/Progress Updates:    Patient received sitting up in bed, agreeable to PT. She denies pain. RN present to empty nephrostomy bag. She was able to come sit edge of bed with CGA for managing R LE initially, then patient able to manage herself. [place] stand pivot to wc. She was able to propel wc herself using hemi technique to the Dayroom. CGA sit > stand into Litegait with TotalA to don harness with extra caution taken to ensure integrity of nephrostomy tubing and bag. Patient ambulating at 0.63mph for 2' 20ft with intermittent CGA/MinA to R knee to prevent excessive hyperextension. Patient with seated rest break and then ambulated additional 121ft at 0.36mph for 3'. Again, intermittent CGA/MinA provided to prevent excessive R knee hyperextension despite use of AFO and facilitate R weight shift during stance phase. Patient then ambulating 15ft overground with SBQC and MinA. Step through gait pattern, decreased gait speed, circumduction to R LE with minimal weight shift R during stance. Through a series of yes/no questions, PT able to confirm that her gait pattern now is similar to how she was ambulating prior to this hospitalization. Patient ambulating additional 69ft, 89ft, 95ft with SBQC and grossly MinA. No LOB and improved weight shift R eventually. Patient propelling herself in wc using hemi technique to her room with supervision. [place] stand pivot to  toilet with MaxA for clothing management in standing. Patient continent of bladder and able to complete perihygiene seated with supervision. [place] stand pivot back to wc with MaxA for clothing management in standing. Patient returning to bed via stand pivot with ModA and return supine with MinA. Bed alarm on, call light within reach.   Therapy Documentation Precautions:  Precautions Precautions: Fall Precaution Comments: global aphasic, residual R hemiplegia, urostomy Restrictions Weight Bearing Restrictions: No    Therapy/Group: Individual Therapy  Karoline Caldwell, PT, DPT, CBIS  12/21/2020, 7:47 AM

## 2020-12-21 NOTE — Progress Notes (Signed)
Inpatient Rehabilitation Care Coordinator Assessment and Plan Patient Details  Name: Melanie Madden MRN: [id number] Date of Birth: [birth year]  Today's Date: 12/21/2020  Hospital Problems: Principal Problem:   Debility Active Problems:   Hyponatremia   Urinary retention   E. coli UTI  Past Medical History:  Past Medical History:  Diagnosis Date  . Allergy   . Anxiety   . Arthritis   . High triglycerides   . Kidney stones   . Migraines   . Recurrent sinus infections    Past Surgical History:  Past Surgical History:  Procedure Laterality Date  . ANTERIOR CRUCIATE LIGAMENT REPAIR Left   . BUNIONECTOMY Right   . IR NEPHROSTOMY PLACEMENT RIGHT  12/09/2020  . LASIK    . LITHOTRIPSY    . MENISCUS REPAIR Left   . NASAL SEPTUM SURGERY    . SHOULDER SURGERY    . VARICOSE VEIN SURGERY Left    Social History:  reports that she has quit smoking. She has never used smokeless tobacco. She reports current alcohol use. She reports that she does not use drugs.  Family / Support Systems Marital Status: Married Patient Roles: Spouse Spouse/Significant Other: [name] Other Supports: Mariette (Sister) Anticipated Caregiver: spouse and hired caregiver Ability/Limitations of Caregiver: has caregiver [phone] Caregiver Availability: 24/7  Social History Preferred language: English Religion:  Read: Yes Write: Yes Legal History/Current Legal Issues: n/a Guardian/Conservator: [name]   Abuse/Neglect Abuse/Neglect Assessment Can Be Completed: Yes Physical Abuse: Denies Verbal Abuse: Denies Sexual Abuse: Denies Exploitation of patient/patient's resources: Denies Self-Neglect: Denies  Emotional Status Recent Psychosocial Issues: anxiety Psychiatric History: n/a Substance Abuse History: n/a  Patient / Family Perceptions, Expectations & Goals Pt/Family understanding of illness & functional limitations: yes Premorbid pt/family roles/activities: in the comm 2-3x a  week Anticipated changes in roles/activities/participation: family able to assist Pt/family expectations/goals: supervision to [employer]: None Premorbid Home Care/DME Agencies: Other (Comment) (TTB, WC, Quad Cane, Toilet Bars, [facility]) Transportation available at discharge: family able transport Resource referrals recommended: Neuropsychology (anxiety)  Discharge Planning Living Arrangements: Spouse/significant other,Other relatives Support Systems: Spouse/significant other Type of Residence: Private residence (2 level home, 1/2 bath on main) Insurance Resources: [employer] (specify county) Passenger transport manager) Museum/gallery curator Resources: [place] Referred: No Does the patient have any problems obtaining your medications?: No Home Management: independent Patient/Family Preliminary Plans: spouse able to assist Care Coordinator Anticipated Follow Up Needs: HH/OP Expected length of stay: 12-14 Days  Clinical Impression sw met with pt introduced self and explained role. Pt plans to d/c home with spouse, family and plans to hire [facility]. No additional questions or concerns. SW will meet with pt and family for updates on today  Dyanne Iha 12/21/2020, 9:37 AM

## 2020-12-21 NOTE — Patient Care Conference (Signed)
Inpatient RehabilitationTeam Conference and Plan of Care Update Date: 12/21/2020   Time: 10:45 AM    Patient Name: Melanie Madden      Medical Record Number: [id number]  Date of Birth: 07/06/1966 Sex: Female         Room/Bed: 4W17C/4W17C-01 Payor Info: Payor: CIG[employer] / Plan: CIGNA MANAGED / Product Type: *No Product type* /    Admit Date/Time:  12/16/2020  3:04 PM  Primary Diagnosis:  [place] Madden Problems: Principal Problem:   Debility Active Problems:   Hyponatremia   Urinary retention   E. coli UTI    Expected Discharge Date: Expected Discharge Date: 12/27/20  Team Members Present: Physician leading conference: Dr. Alysia Penna Care Coodinator Present: Dorien Chihuahua, RN, BSN, CRRN;Christina [place], BSW Nurse Present: Dorien Chihuahua, RN PT Present: Estevan Ryder, PT OT Present: Clyda Greener, OT PPS Coordinator present : Gunnar Fusi, SLP     Current Status/Progress Goal Weekly Team Focus  Bowel/Bladder   continent  vs incontinent B/B. Last BM-5/23  regain full continence  assess q shift and PRN   Swallow/Nutrition/ Hydration             ADL's   Min assist for UB selfcare with mod assist for LB selfcare, mod assist for squat pivot transfers with max assist for stand pivot transfers.  Brunnstrum stage I in the right arm and hand.  apraxia with global aphasia  min assist overall  selfcare retraining, transfer training, DME education, NMES, pt/family education, neuromuscular re-education,   Mobility   up to [place] bed mobility, ModA/MaxA squat pivot/ STS, spv/MinA wc mobility using hemi technique, MaxA gait up to 91ft, insists on using SBQC (vs. WBQC) but has heavy reliance on L UE on cane questioning stability of cane  MinA  communication for safe cuing, gait progressions, transfers, wc mobility, dynamic balance   Communication             Safety/Cognition/ Behavioral Observations            Pain   No c/o pain  pain free  assess pain q shift  and PRN   Skin   intact. Closed system drainage- nephrostomy bag.  skin remain itack  assess skin q shift and PRN     Discharge Planning:  Discharging home with spouse. Has a caregiver. 24/7 supervision   Team Discussion: Chronic deficits affecting functional status more than medical issues. Neurologically at baseline. Strong willed personality limits progress; patient declines recommendations and is not receptive to cues to correct.   Patient on target to meet rehab goals: Currently min assist for upper body bathing and dressing and min - mod assist for lower body care. Squat pivot transfers at mod assist with AFO on. Able to ambulate 5' with a SBQC and not receptive to other device or even LBQC. For SLP; patient is close to her baseline level. Min assist goals set for discharge  *See Care Plan and progress notes for long and short-term goals.   Revisions to Treatment Plan:   Teaching Needs: Care of the nephrostomy tube, safety, medications, dietary modifications, etc.   Current Barriers to Discharge: Decreased caregiver support, Home enviroment access/layout, Behavior and Nephrostomy tube  Possible Resolutions to Barriers: Patient has hired caregiver Patient has ramped entrance and chair lift in the home Family education recommended     Medical Summary Current Status: obstructive uropathy improving, s/p urostomy  Barriers to Discharge: Medical stability;Other (comments)  Barriers to Discharge Comments: severe aphasia and R HP from  CV[facility] in 2020     Continued Need for Acute Rehabilitation Level of Care: The patient requires daily medical management by a physician with specialized training in physical medicine and rehabilitation for the following reasons: Direction of a multidisciplinary physical rehabilitation program to maximize functional independence : Yes Medical management of patient stability for increased activity during participation in an intensive rehabilitation  regime.: Yes Analysis of laboratory values and/or radiology reports with any subsequent need for medication adjustment and/or medical intervention. : Yes   I attest that I was present, lead the team conference, and concur with the assessment and plan of the team.   Dorien Chihuahua B 12/21/2020, 1:59 PM

## 2020-12-21 NOTE — Progress Notes (Signed)
[place] PHYSICAL MEDICINE & REHABILITATION PROGRESS NOTE  Subjective/Complaints: Remains expressively aphasic and apraxic of speech Per OT, pt prefers doing ADLs her "usual way"  ROS: Denies CP, SOB, N/V/D  Objective: Vital Signs: Blood pressure 121/72, pulse 79, temperature 98.5 F (36.9 C), temperature source Oral, resp. rate 20, weight 67.5 kg, SpO2 97 %. No results found. Recent Labs    12/19/20 0625  WBC 9.7  HGB 10.9*  HCT 32.7*  PLT 287   Recent Labs    12/19/20 0625  NA 133*  K 4.1  CL 103  CO2 23  GLUCOSE 121*  BUN 35*  CREATININE 1.01*  CALCIUM 9.0    Intake/Output Summary (Last 24 hours) at 12/21/2020 0842 Last data filed at 12/21/2020 0840 Gross per 24 hour  Intake 636 ml  Output 1621 ml  Net -985 ml        Physical Exam: BP 121/72 (BP Location: Right Arm)   Pulse 79   Temp 98.5 F (36.9 C) (Oral)   Resp 20   Wt 67.5 kg   SpO2 97%   BMI 23.31 kg/m    General: No acute distress Mood and affect are appropriate Heart: Regular rate and rhythm no rubs murmurs or extra sounds Lungs: Clear to auscultation, breathing unlabored, no rales or wheezes Abdomen: Positive bowel sounds, soft nontender to palpation, nondistended Extremities: No clubbing, cyanosis, or edema Skin: No evidence of breakdown, no evidence of ra  GU: + Nephrostomy drain Skin: Warm and dry.  Intact. Psych: Flat.  Limited engagement. Musc: RUE/RLE: With mild edema, stable.  No tenderness in extremities. Neuro: Alert Global aphasia, stable Motor: 0/5 RUE, 3- R Quad, and HF, 0 at ankle/foot    Assessment/Plan: 1. Functional deficits which require 3+ hours per day of interdisciplinary therapy in a comprehensive inpatient rehab setting.  Physiatrist is providing close team supervision and 24 hour management of active medical problems listed below.  Physiatrist and rehab team continue to assess barriers to discharge/monitor patient progress toward functional and medical  goals   Care Tool:  Bathing  Bathing activity did not occur: Refused Body parts bathed by patient: Chest,Abdomen,Right upper leg,Left upper leg,Left lower leg,Face,Right arm   Body parts bathed by helper: Right lower leg,Left arm,Front perineal area,Buttocks     Bathing assist Assist Level: Maximal Assistance - Patient 24 - 49%     Upper Body Dressing/Undressing Upper body dressing   What is the patient wearing?: Pull over shirt    Upper body assist Assist Level: Minimal Assistance - Patient > 75%    Lower Body Dressing/Undressing Lower body dressing      What is the patient wearing?: Incontinence brief,Pants     Lower body assist Assist for lower body dressing: Maximal Assistance - Patient 25 - 49%     Toileting Toileting    Toileting assist Assist for toileting: Total Assistance - Patient < 25%     Transfers Chair/bed transfer  Transfers assist     Chair/bed transfer assist level: Moderate Assistance - Patient 50 - 74% (stand pivot)     Locomotion Ambulation   Ambulation assist   Ambulation activity did not occur: Safety/medical concerns          Walk 10 feet activity   Assist  Walk 10 feet activity did not occur: Safety/medical concerns        Walk 50 feet activity   Assist Walk 50 feet with 2 turns activity did not occur: Safety/medical concerns  Walk 150 feet activity   Assist Walk 150 feet activity did not occur: Safety/medical concerns         Walk 10 feet on uneven surface  activity   Assist Walk 10 feet on uneven surfaces activity did not occur: Safety/medical concerns         Wheelchair     Assist Will patient use wheelchair at discharge?: Yes Type of Wheelchair: Manual    Wheelchair assist level: Supervision/Verbal cueing Max wheelchair distance: 150    Wheelchair 50 feet with 2 turns activity    Assist        Assist Level: Supervision/Verbal cueing   Wheelchair 150 feet activity      Assist      Assist Level: Supervision/Verbal cueing    Medical Problem List and Plan: 1.  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  Continue CIR PT, OT, SLP Baseline is severe expressive aphasia/apraxia Able to amb 500' SBQC with supervision per last OP PT note 5/10 Ask IR if pt can shower with urostomy  Team conference today please see physician documentation under team conference tab, met with team  to discuss problems,progress, and goals. Formulized individual treatment plan based on medical history, underlying problem and comorbidities.  2.  Antithrombotics: -DVT/anticoagulation:  Mechanical: Sequential compression devices, below knee Bilateral lower extremities             -antiplatelet therapy: N/A 3. 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   Appears to be controlled on 5/22  Monitor with increased exertion 4. Mood: LCSW to follow for evaluation and support.              -antipsychotic agents: N/A 5. Neuropsych: This patient is not capable of making decisions on her own behalf. 6. Skin/Wound Care: Routine pressure relief measures.  7. Fluids/Electrolytes/Nutrition: Monitor I/Os.  8. E.coli UTI: antibiotics narrowed to IV ceftriaxone, discussed with pharmacy changed to Keflex--Day #9/14 .  9. Left emphysematous pyelitis s/p nephrostomy: Continue to monitor output dily.              --Radiology following for input.   Foley DC'd, voiding trial 10. Leucocytosis:  .               resolved on Keflex  Afebrile  Monitor for signs and symptoms of infection 11. Thrombocytopenia:   Platelets 151 on 5/21, improving  12. Abnormal LFTs:              LFTs elevated on 5/21  Continue to monitor 13. Acute kidney injury: resolved, obstructive uropathy, s/p ureteral stent per  IR   Creatinine 1.0 on 5/21  Continue to monitor 14. HTN:  Hypotension has resolved. Continue to hold norvasc.    Vitals:   12/20/20 1940 12/21/20 0428  BP: 135/74 121/72  Pulse: 93 79  Resp: 20 20  Temp:  98.5 F (36.9 C)  SpO2: 100% 97%  Controlled 5/25             Monitor his increase mobility 15. Adjustment disorder: Managed on Seroquel bid. 16. Stress induced hyperglycemia: Hgb A1C-5.2 but BS labile             --Continue to monitor with ac/hs CBG checks.  slightly elevated on 5/22, monitor for now  Monitor with increased mobility 17.  Hyponatremia  Sodium 133 on 5/21  Continue to monitor  LOS: 5 days A FACE TO [place] E Roberts Bon 12/21/2020, 8:42 AM

## 2020-12-22 LAB — GLUCOSE, CAPILLARY
Glucose-Capillary: 131 mg/dL — ABNORMAL HIGH (ref 70–99)
Glucose-Capillary: 106 mg/dL — ABNORMAL HIGH (ref 70–99)
Glucose-Capillary: 104 mg/dL — ABNORMAL HIGH (ref 70–99)
Glucose-Capillary: 103 mg/dL — ABNORMAL HIGH (ref 70–99)

## 2020-12-22 NOTE — Progress Notes (Signed)
Speech Language Pathology Daily Session Note  Patient Details  Name: Melanie Madden MRN: [id number] Date of Birth: [birth year]  Today's Date: 12/22/2020 SLP Individual Time: 2248-2500 SLP Individual Time Calculation (min): 40 min  Short Term Goals: Week 1: SLP Short Term Goal 1 (Week 1): Patient will follow basic commands with 90% accuracy and Min A multimodal cues. SLP Short Term Goal 2 (Week 1): Patient will answer mildly complex yes/no questions with 75% accuracy with Min A multimodal cues. SLP Short Term Goal 3 (Week 1): Patient will utilize mulitmodal communication to express wants/needs with Mod A multimodal cues. SLP Short Term Goal 4 (Week 1): Patient will demonstrate functional problem solving for basic and familiar tasks with Mod A verbal cues. SLP Short Term Goal 5 (Week 1): Patient will demonstrate selective attention to functional tasks in a mildly distracting enviornment for ~45 minutes with Min verbal cues for redirection.  Skilled Therapeutic Interventions:   Patient seen for skilled ST session focusing on speech-language goals. Initially, SLP introduced some word-level identification tasks in fields of 2 then 4-6, however patient seemed to not be engaged in this. She gestured by pointing to mouth and moving lips and tongue and making some sounds, seeming to indicate she wanted to work on sound production. SLP then worked changed focus to working on oral motor and speech sound production at phoneme and word level secondary to patient's moderate apraxia. Patient was able to imitate to produce /p/ and /b/ in initial position of CV (consonant-vowel) words with modA; she produced /k/ in medial position for two-word combination ending in /k/ and starting in /k/, ie: "make-cake" with maxA cues. She produced three syllable words with mod-max cues to pause between syllables and for imitating of clinician producing each phoneme. During review of /p/ in initial position of words,  patient then required maxA for 20% accuracy as she would produce /n/ instead of /p/ despite visual modeling of lip movements. Patient then started to write her name and phone number, and seemed to indicate she would like SLP to call her or her husband, but unclear why. Patient continues to benefit from skilled SLP intervention to maximize speech-language cognitive function prior to discharge.  Pain Pain Assessment Pain Scale: 0-10 Pain Score: 0-No pain  Therapy/Group: Individual Therapy  Sonia Baller, MA, CCC-SLP Speech Therapy

## 2020-12-22 NOTE — Progress Notes (Signed)
Physical Therapy Session Note  Patient Details  Name: Melanie Madden MRN: [id number] Date of Birth: Feb 25, 1966  Today's Date: 12/22/2020 PT Individual Time: 1005-1100 PT Individual Time Calculation (min): 55 min   Short Term Goals: Week 1:  PT Short Term Goal 1 (Week 1): Pt will perform bed mobility with mod assist PT Short Term Goal 2 (Week 1): Pt will preformed bed<>WC transfer with mod assist PT Short Term Goal 3 (Week 1): Pt will perform gait training with LRAD for 7ft with mod assit and +2 for safety  Skilled Therapeutic Interventions/Progress Updates:   Pt received sitting in [facility] and agreeable to PT  Pt transported to rehab gym. Gait training with min assist 2 x 70ft with cues for AD management and improved awareness of the RLE. Pt noted to circumduct RLE for limb advancement. Pt performed pregait stepping over hockey stick with RLE 2 x 10 with facilitation from PT to improve hip/knee flexion to advance the RLE.  Forward/reverse gait 4ft each x 2 with min assist with moderate cues for improved posture, to allow increased step height on the RLE.   WC mobility 173ft+150ft with supervision assist using Hemi technique. Nustep 5 min +3 minutes with mod-max assist to maintain neutral hip ER/IR. Patient returned to room and left sitting in [facility] with call bell in reach and all needs met.         Therapy Documentation Precautions:  Precautions Precautions: Fall Precaution Comments: global aphasic, residual R hemiplegia, urostomy Restrictions Weight Bearing Restrictions: No Vital Signs: Therapy Vitals Temp: 98 F (36.7 C) Pulse Rate: 89 Resp: 17 BP: 132/73 Patient Position (if appropriate): Sitting Oxygen Therapy SpO2: 100 % O2 Device: Room Air Pain: denies   Therapy/Group: Individual Therapy  Lorie Phenix 12/22/2020, 1:04 PM

## 2020-12-22 NOTE — Progress Notes (Signed)
Occupational Therapy Session Note  Patient Details  Name: Melanie Madden MRN: [id number] Date of Birth: 1966/04/10  Today's Date: 12/22/2020 OT Individual Time: 6568-1275 OT Individual Time Calculation (min): 54 min    Short Term Goals: Week 1:  OT Short Term Goal 1 (Week 1): Pt will transfer to toilet wit MOD A and LRAD consistently OT Short Term Goal 2 (Week 1): Pt will dons shirt wiht S OT Short Term Goal 3 (Week 1): Pt will thread BLE wiht S  Skilled Therapeutic Interventions/Progress Updates:    Pt received semi-reclined in bed, pointing at her brief to indicate need to use bathroom, agreeable to therapy. Came to sitting EOB with min A to scoot forward, able to ind manage her RLE. Squat-pivot > TIS > toilet placed on her L with mod A for initial lift and to pivot hips. Mod A to doff brief in standing at toilet with use of L grab bar. Cont void of b/b. Close S for seated pericare. Donned new brief with mod A to thread BLE and to adjust over hips. Donned pants with overall mod A to thread RLE + pull over R hip + for balance during STS. Completed oral and hair care with set-up of tooth brush. Therapist donned teds with total A, donned B shoes/L AFO with mod A to adjust L shoe over heel and to fasten AFO straps. Total A w/c transport to and from gym. At high-low table, completed STS x4 to complete visuoconstructive task. Pt is able to copy the level 3 difficulty designs with 100% accuracy, but req mod A to maintain static standing with no LUE support. Req freq cues and physical assist to return to midline 2/2 L lean. Facilitated RUE WB, as well.  Pt overall is able to manage her RUE/RLE, ind initiate hemi dressing techniques, and assist in directing her care.  Pt left seated in w/c with safety belt alarm engaged, call bell in reach, and all immediate needs met.    Therapy Documentation Precautions:  Precautions Precautions: Fall Precaution Comments: global aphasic, residual R  hemiplegia, urostomy Restrictions Weight Bearing Restrictions: No Pain:  no s/sx during session ADL: See Care Tool for more details.  Therapy/Group: Individual Therapy  Volanda Napoleon MS, OTR/L  12/22/2020, 6:57 AM

## 2020-12-22 NOTE — Progress Notes (Signed)
Physical Therapy Session Note  Patient Details  Name: Melanie Madden MRN: [id number] Date of Birth: 10-Jun-1966  Today's Date: 12/22/2020 PT Individual Time: 1027-2536 PT Individual Time Calculation (min): 24 min   Short Term Goals: Week 1:  PT Short Term Goal 1 (Week 1): Pt will perform bed mobility with mod assist PT Short Term Goal 2 (Week 1): Pt will preformed bed<>WC transfer with mod assist PT Short Term Goal 3 (Week 1): Pt will perform gait training with LRAD for 92ft with mod assit and +2 for safety  Skilled Therapeutic Interventions/Progress Updates:     Pt greeted seated in w/c - pointing to clock as therapist was running 10 minutes late - she required convincing and encouragement to participate with therapist apologizing. Nephrostomy bag nearly full and paged out to RN to assist with emptying prior to mobilizing. After this was complete, transported in w/c to dayroom rehab gym. Focused remainder of session on functional gait training. Ambulated 13ft + 34ft with minA and small based quad cane with R AFO - gait deficits with R circumduction and over compensating with lateral trunk leans, intermittent toe drag with R foot with fatigue. Cues for increasing R knee/hip flexors to improve swing but limited faciliattion. Returned to room in w/c for time management and completed stand<>pivot transfer with minA back to bed. Required minA for sit>supine for BLE management. All needs in reach at end of session with bed alarm on, pt appearing appreciative.   Therapy Documentation Precautions:  Precautions Precautions: Fall Precaution Comments: global aphasic, residual R hemiplegia, urostomy Restrictions Weight Bearing Restrictions: No General:    Therapy/Group: Individual Therapy  Destenee Guerry P Cortez Flippen PT 12/22/2020, 12:23 PM

## 2020-12-23 LAB — GLUCOSE, CAPILLARY
Glucose-Capillary: 101 mg/dL — ABNORMAL HIGH (ref 70–99)
Glucose-Capillary: 126 mg/dL — ABNORMAL HIGH (ref 70–99)
Glucose-Capillary: 128 mg/dL — ABNORMAL HIGH (ref 70–99)
Glucose-Capillary: 128 mg/dL — ABNORMAL HIGH (ref 70–99)

## 2020-12-23 NOTE — Progress Notes (Signed)
Patient ID: Melanie Madden, female   DOB: Apr 23, 1966, 55 y.o.   MRN: [id number]   OP orders sent. No DME reccommendations per therapy as of 5/27  Erlene Quan, [place]

## 2020-12-23 NOTE — Progress Notes (Signed)
Physical Therapy Session Note  Patient Details  Name: Melanie Madden MRN: [id number] Date of Birth: [birth year]  Today's Date: 12/23/2020 PT Individual Time: 6010-9323 PT Individual Time Calculation (min): 50 min   Short Term Goals: Week 1:  PT Short Term Goal 1 (Week 1): Pt will perform bed mobility with mod assist PT Short Term Goal 2 (Week 1): Pt will preformed bed<>WC transfer with mod assist PT Short Term Goal 3 (Week 1): Pt will perform gait training with LRAD for 13ft with mod assit and +2 for safety  Skilled Therapeutic Interventions/Progress Updates:    Patient received asleep in bed, easy to wake and agreeable to PT. She denies pain. Husband not present for scheduled family ed and patient indicates that he will not be coming. Patient indicating to PT that her brief was soiled, but refused bedside pericare insisting on going to bathroom. Patient then also refusing to don AFO. Stand pivot to wc with R ankle moving into inversion and toes curling- MinA provided. Patient transferring to toilet via stand pivot with clothing management in standing. Continent of bladder- completed perihygiene with set up assist and supervision seated. Patient donning pants seated with MinA and standing with MinA to pull up. PT donning TED hose MaxA and patient insisting on donning shoes herself. Supervision and extended time provided to do so. PT transporting patient in wc to therapy gym for time management and energy conservation. She ambulated 32ft and then 59ft with SBQC and CGA. Patient maintaining the following gait impairments: R LE circumduction, decreased R knee flexion and foot clearance through swing phase, excessive L lateral lean to assist with R foot clearance. Patient returning to room in wc, seatbelt alarm on, call light within reach.   and patient   Therapy Documentation Precautions:  Precautions Precautions: Fall Precaution Comments: global aphasic, residual R hemiplegia,  urostomy Restrictions Weight Bearing Restrictions: No    Therapy/Group: Individual Therapy  Karoline Caldwell, PT, DPT, CBIS  12/23/2020, 7:43 AM

## 2020-12-23 NOTE — Progress Notes (Signed)
[place] PHYSICAL MEDICINE & REHABILITATION PROGRESS NOTE  Subjective/Complaints: No complaints this morning Makes good eye contact Nods Vitals stable  ROS: denies CP, SOB, N/V/D  Objective: Vital Signs: Blood pressure 121/78, pulse 97, temperature 98 F (36.7 C), resp. rate 19, weight 64.4 kg, SpO2 100 %. No results found. No results for input(s): WBC, HGB, HCT, PLT in the last 72 hours. No results for input(s): NA, K, CL, CO2, GLUCOSE, BUN, CREATININE, CALCIUM in the last 72 hours.  Intake/Output Summary (Last 24 hours) at 12/23/2020 1602 Last data filed at 12/23/2020 1300 Gross per 24 hour  Intake 720 ml  Output 1145 ml  Net -425 ml        Physical Exam: BP 121/78 (BP Location: Right Arm)   Pulse 97   Temp 98 F (36.7 C)   Resp 19   Wt 64.4 kg   SpO2 100%   BMI 22.24 kg/m  Gen: no distress, normal appearing HEENT: oral mucosa pink and moist, NCAT Cardio: Reg rate Chest: normal effort, normal rate of breathing  GU: + Nephrostomy drain Skin: Warm and dry.  Intact. Psych: Flat.  Limited engagement. Musc: RUE/RLE: With mild edema, stable.  No tenderness in extremities. Neuro: Alert Global aphasia, stable Motor: 0/5 RUE, 3- R Quad, and HF, 0 at ankle/foot    Assessment/Plan: 1. Functional deficits which require 3+ hours per day of interdisciplinary therapy in a comprehensive inpatient rehab setting.  Physiatrist is providing close team supervision and 24 hour management of active medical problems listed below.  Physiatrist and rehab team continue to assess barriers to discharge/monitor patient progress toward functional and medical goals   Care Tool:  Bathing  Bathing activity did not occur: Refused Body parts bathed by patient: Chest,Abdomen,Right upper leg,Left upper leg,Left lower leg,Face,Right arm,Front perineal area,Buttocks,Right lower leg   Body parts bathed by helper: Left arm     Bathing assist Assist Level: Minimal Assistance - Patient >  75%     Upper Body Dressing/Undressing Upper body dressing   What is the patient wearing?: Pull over shirt    Upper body assist Assist Level: Minimal Assistance - Patient > 75%    Lower Body Dressing/Undressing Lower body dressing      What is the patient wearing?: Incontinence brief,Pants     Lower body assist Assist for lower body dressing: Moderate Assistance - Patient 50 - 74%     Toileting Toileting    Toileting assist Assist for toileting: Moderate Assistance - Patient 50 - 74%     Transfers Chair/bed transfer  Transfers assist     Chair/bed transfer assist level: Minimal Assistance - Patient > 75%     Locomotion Ambulation   Ambulation assist   Ambulation activity did not occur: Safety/medical concerns  Assist level: Contact Guard/Touching assist Assistive device: Cane-quad Max distance: 80   Walk 10 feet activity   Assist  Walk 10 feet activity did not occur: Safety/medical concerns  Assist level: Contact Guard/Touching assist Assistive device: Cane-quad   Walk 50 feet activity   Assist Walk 50 feet with 2 turns activity did not occur: Safety/medical concerns  Assist level: Contact Guard/Touching assist Assistive device: Cane-quad    Walk 150 feet activity   Assist Walk 150 feet activity did not occur: Safety/medical concerns         Walk 10 feet on uneven surface  activity   Assist Walk 10 feet on uneven surfaces activity did not occur: Safety/medical concerns  Wheelchair     Assist Will patient use wheelchair at discharge?: Yes Type of Wheelchair: Manual    Wheelchair assist level: Supervision/Verbal cueing Max wheelchair distance: 150    Wheelchair 50 feet with 2 turns activity    Assist        Assist Level: Supervision/Verbal cueing   Wheelchair 150 feet activity     Assist      Assist Level: Supervision/Verbal cueing    Medical Problem List and Plan: 1.  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  Continue CIR PT, OT, SLP Baseline is severe expressive aphasia/apraxia Able to amb 500' SBQC with supervision per last OP PT note 5/10 Ask IR if pt can shower with urostomy  2.  Antithrombotics: -DVT/anticoagulation:  Mechanical: Sequential compression devices, below knee Bilateral lower extremities             -antiplatelet therapy: N/A 3. 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   Appears to be controlled on 5/22  Monitor with increased exertion 4. Mood: LCSW to follow for evaluation and support.              -antipsychotic agents: N/A 5. Neuropsych: This patient is not capable of making decisions on her own behalf. 6. Skin/Wound Care: Routine pressure relief measures.  7. Fluids/Electrolytes/Nutrition: Monitor I/Os.  8. E.coli UTI: antibiotics narrowed to IV ceftriaxone, discussed with pharmacy changed to Keflex--Day #9/14 .  9. Left emphysematous pyelitis s/p nephrostomy: Continue to monitor output dily.              --Radiology following for input.   Foley DC'd, voiding trial 10. Leucocytosis:  .               resolved on Keflex  Afebrile  Monitor for signs and symptoms of infection 11. Thrombocytopenia:   Platelets 151 on 5/21, improving  12. Abnormal LFTs:              LFTs elevated on 5/21  Continue to monitor 13. Acute kidney injury: resolved, obstructive uropathy, s/p ureteral stent per IR   Creatinine 1.0 on 5/21  Continue to monitor 14. HTN:  Hypotension has resolved. Continue to hold norvasc.    Vitals:   12/23/20 0443 12/23/20 1505  BP: 113/70 121/78  Pulse: 85 97  Resp: 18 19  Temp: 98.1 F (36.7 C) 98 F (36.7 C)  SpO2: 97% 100%  Controlled 5/27             Monitor his increase mobility 15. Adjustment disorder: Continue  Seroquel bid. 16. Stress induced hyperglycemia: Hgb A1C-5.2 but BS labile             --Continue to monitor with ac/hs CBG checks.            slightly elevated on 5/22, monitor for now  Monitor with increased mobility 17.  Hyponatremia  Sodium 133 on 5/21  Continue to monitor  LOS: 7 days A FACE TO FACE EVALUATION WAS PERFORMED  Clide Deutscher Deaisa Merida 12/23/2020, 4:02 PM

## 2020-12-23 NOTE — Progress Notes (Addendum)
Speech Language Pathology Weekly Progress and Session Note  Patient Details  Name: Melanie Madden MRN: [id number] Date of Birth: 10-12-1965  Beginning of progress report period: 12/17/2020 End of progress report period: 12/23/2020  Today's Date: 12/23/2020 SLP Individual Time: 8676-7209 SLP Individual Time Calculation (min): 30 min  Short Term Goals: Week 1: SLP Short Term Goal 1 (Week 1): Patient will follow basic commands with 90% accuracy and Min A multimodal cues. SLP Short Term Goal 1 - Progress (Week 1): Met SLP Short Term Goal 2 (Week 1): Patient will answer mildly complex yes/no questions with 75% accuracy with Min A multimodal cues. SLP Short Term Goal 2 - Progress (Week 1): Progressing toward goal SLP Short Term Goal 3 (Week 1): Patient will utilize mulitmodal communication to express wants/needs with Mod A multimodal cues. SLP Short Term Goal 3 - Progress (Week 1): Progressing toward goal SLP Short Term Goal 4 (Week 1): Patient will demonstrate functional problem solving for basic and familiar tasks with Mod A verbal cues. SLP Short Term Goal 4 - Progress (Week 1): Met SLP Short Term Goal 5 (Week 1): Patient will demonstrate selective attention to functional tasks in a mildly distracting enviornment for ~45 minutes with Min verbal cues for redirection. SLP Short Term Goal 5 - Progress (Week 1): Met    New Short Term Goals: Week 2: SLP Short Term Goal 1 (Week 2): STG=LTG (ELOS: 3/31)  Weekly Progress Updates:  Patient made good progress and met 3/5 STG's. 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 Plan is for discharge on 5/31 with recommendation to continue with outpatient SLP.   Intensity: Minumum of 1-2 x/day, 30 to 90 minutes Frequency: 3 to 5 out of 7 days Duration/Length of Stay: 5/31 Treatment/Interventions: Cognitive remediation/compensation;Internal/external aids;Speech/Language  facilitation;Cueing hierarchy;Environmental controls;Therapeutic Activities;Functional tasks;Patient/family education   Daily Session  Skilled Therapeutic Interventions: Patient seen for skilled ST session focusing on speech production as no family present for family education. She demonstrated improved accuracy and consistency with production of bilabial phonemes (/p/, b/) initial positions of words and with /p/, significantly improved accuracy when cueing patient to close lips before starting phonation. She continues to be able to produce /k/ and /g/ in medial position "take go", etc but not in initial. Patient continues to benefit from skilled SLP intevention to maximize speech production and cognitive-linguistic function prior to discharge.    General    Pain Pain Assessment Pain Scale: 0-10 Faces Pain Scale: No hurt  Therapy/Group: Individual Therapy   Sonia Baller, MA, CCC-SLP Speech Therapy

## 2020-12-23 NOTE — Progress Notes (Signed)
Physical Therapy Session Note  Patient Details  Name: Melanie Madden MRN: [id number] Date of Birth: [birth year]  Today's Date: 12/23/2020 PT Individual Time: 1605-1630 PT Individual Time Calculation (min): 25 min   Short Term Goals: Week 1:  PT Short Term Goal 1 (Week 1): Pt will perform bed mobility with mod assist PT Short Term Goal 2 (Week 1): Pt will preformed bed<>WC transfer with mod assist PT Short Term Goal 3 (Week 1): Pt will perform gait training with LRAD for 72f with mod assit and +2 for safety  Skilled Therapeutic Interventions/Progress Updates:   Pt received sitting in WC and agreeable to PT. [facility] instructed pt in dynamic balance training while engaged in cognitive/fine motor task of wii bowling. Sit<>stand x 4 with min assist from Pt for safety with min cues for RLE position prior to standing. Pt able to tolerate 3 frames and 2 frames in standing  With intermittent 1-0 UE support and min assist safety, prior to requesting seated rest break. moderate cues for problem solving and attention to task intermittently as pt demonstrate inconsistent awareness of control requirements. [facility] and all needs met.         Therapy Documentation Precautions:  Precautions Precautions: Fall Precaution Comments: global aphasic, residual R hemiplegia, urostomy Required Braces or Orthoses: Other Brace (AFO for the right foot) Restrictions Weight Bearing Restrictions: No Vital Signs: Therapy Vitals Temp: 98 F (36.7 C) Pulse Rate: 97 Resp: 19 BP: 121/78 Patient Position (if appropriate): Sitting Oxygen Therapy SpO2: 100 % O2 Device: Room Air Pain: Pain Assessment Pain Scale: 0-10 Faces Pain Scale: No hurt    Therapy/Group: Individual Therapy  ALorie Phenix5/27/2022, 4:31 PM

## 2020-12-23 NOTE — Progress Notes (Addendum)
Occupational Therapy Weekly Progress Note  Patient Details  Name: Melanie Madden MRN: [id number] Date of Birth: 03-04-1966  Beginning of progress report period: Dec 17, 2020 End of progress report period: Dec 23, 2020  Today's Date: 12/23/2020 OT Individual Time: 8338-2505 OT Individual Time Calculation (min): 54 min    Patient has met 1 of 3 short term goals.  [name] is making steady progress with OT at this time.  She is able to complete UB bathing and dressing at supervision and then she completes LB bathing and most dressing at min assist.  She does need total assist for donning TEDs with min assist for right AFO and shoe.  She is able to complete stand pivot transfers with use of the quad cane at min assist level for for transfer to the toilet and the wheelchair.  She needs mod assist for simulated shower/tub transfer with use of the tub bench.  Bathing has only been completed at sink level currently secondary to nephrostomy drain.  Mrs. Melanie Madden continues to demonstrate dense RUE and RLE hemiparesis with max hand over hand assist needed for integration of the RUE into selfcare tasks.  Brunnstrum stage II movement is noted however with some trace shoulder flexion noted in the right plane with gravity eliminated.  Feel she will continue to benefit from further short term CIR level therapy with anticipated discharge on 5/31 back home with caregivers and hired assist at min assist level.  Patient continues to demonstrate the following deficits: muscle weakness, muscle joint tightness and muscle paralysis, impaired timing and sequencing, unbalanced muscle activation and decreased coordination, decreased awareness, decreased problem solving, decreased safety awareness, decreased memory and delayed processing and decreased sitting balance, decreased standing balance, decreased postural control, hemiplegia and decreased balance strategies and therefore will continue to benefit from skilled OT  intervention to enhance overall performance with BADL and Reduce care partner burden.  Patient progressing toward long term goals..  Continue plan of care.  OT Short Term Goals Week 2:  OT Short Term Goal 1 (Week 2): Continue working on [employer] at min assist overall.  Skilled Therapeutic Interventions/Progress Updates:    Session 1: (3976-7341)  Pt completed practice with tub/shower transfers stand pivot using the quad cane and tub bench.  When asked if the current tub bench we practiced with is the same as what she has at home, she shook her head "yes".  She completed transfer in with mod assist for lifting the LLE over the edge of the tub going both in and out.  Next, she transferred to the therapy gym via wheelchair with min assist transfer to the mat.  She worked on Brewing technologist for the RUE with use of the tilted stool and therapy ball.  She was able to demonstrate slight shoulder flexion, internal, and external rotation at a trace level.  Mod facilitation to maintain upright sitting posture with anterior pelvic tilt while attempting to move the RLE.  She tends to try and rotate her trunk and flex it forward when trying to move the RUE instead of being able to maintain upright and midline.  Finished session with transfer back to the wheelchair and then back to the room where she was left sitting up in the wheelchair with the call button and phone in reach.  Safety belt in place as well.   Session 2: ([id number]) Pt in wheelchair to start session agreeable to session.  She was taken down to the dayroom where she worked  on standing balance at the high/low table while engaged in peg design task.  Min assist for sit to stand with min assist for standing balance.  RUE was place on the table with emphasis on reaching across midline to the right to pick up and place the pegs.  Pt with increased resistance to weightshift to the right in standing and would continually try and move the  pegs closer to her.  She was able to replicate the design without difficulty.  She then completed standing without task and BUEs positioned on the table working weightshifts to the right through the trunk and pelvis for increased weightbearing through the RLE.  Mod facilitation needed at the right knee to keep from buckling.  Finished session with return to the room and pt left sitting up in the wheelchair with the call button and phone in reach.    Therapy Documentation Precautions:  Precautions Precautions: Fall Precaution Comments: global aphasic, residual R hemiplegia, urostomy Required Braces or Orthoses: Other Brace (AFO for the right foot) Restrictions Weight Bearing Restrictions: No   Pain: Pain Assessment Pain Scale: Faces Pain Score: 0-No pain ADL: See Care Tool Section for some details of mobility and selfcare  Therapy/Group: Individual Therapy  , OTR/L 12/23/2020, 12:30 PM

## 2020-12-23 NOTE — Progress Notes (Signed)
Inpatient Rehabilitation Care Coordinator Discharge Note  The overall goal for the admission was met for:   Discharge location: Yes, home  Length of Stay: Yes, 11 days  Discharge activity level: Yes  Home/community participation: Yes  Services provided included: MD, RD, PT, OT, SLP, RN, CM, TR, Pharmacy, Neuropsych and SW  Financial Services: Private Insurance: [employer] offered to/list presented to:pt  Follow-up services arranged: Outpatient: [facility]  Comments (or additional information): PT OT ST NO DME  Patient/Family verbalized understanding of follow-up arrangements: Yes  Individual responsible for coordination of the follow-up plan: [name] [phone]  Confirmed correct DME delivered: Dyanne Iha 12/23/2020    Dyanne Iha

## 2020-12-24 DIAGNOSIS — R4701 Aphasia: Secondary | ICD-10-CM

## 2020-12-24 LAB — CULTURE, BLOOD (ROUTINE X 2)
Culture: NO GROWTH
Culture: NO GROWTH
Special Requests: ADEQUATE
Special Requests: ADEQUATE

## 2020-12-24 LAB — GLUCOSE, CAPILLARY
Glucose-Capillary: 106 mg/dL — ABNORMAL HIGH (ref 70–99)
Glucose-Capillary: 133 mg/dL — ABNORMAL HIGH (ref 70–99)
Glucose-Capillary: 108 mg/dL — ABNORMAL HIGH (ref 70–99)
Glucose-Capillary: 184 mg/dL — ABNORMAL HIGH (ref 70–99)

## 2020-12-24 NOTE — Progress Notes (Signed)
[place] PHYSICAL MEDICINE & REHABILITATION PROGRESS NOTE  Subjective/Complaints: Pt up in bed. No new complaints today. Apparently a miscommunication on family ed---will be done today  ROS: limited due to language/communication    Objective: Vital Signs: Blood pressure 119/73, pulse 96, temperature 98 F (36.7 C), resp. rate 17, weight 62.6 kg, SpO2 96 %. No results found. No results for input(s): WBC, HGB, HCT, PLT in the last 72 hours. No results for input(s): NA, K, CL, CO2, GLUCOSE, BUN, CREATININE, CALCIUM in the last 72 hours.  Intake/Output Summary (Last 24 hours) at 12/24/2020 1052 Last data filed at 12/24/2020 0745 Gross per 24 hour  Intake 698 ml  Output 960 ml  Net -262 ml        Physical Exam: BP 119/73   Pulse 96   Temp 98 F (36.7 C)   Resp 17   Wt 62.6 kg   SpO2 96%   BMI 21.62 kg/m  Constitutional: No distress . Vital signs reviewed. HEENT: EOMI, oral membranes moist Neck: supple Cardiovascular: RRR without murmur. No JVD    Respiratory/Chest: CTA Bilaterally without wheezes or rales. Normal effort    GI/Abdomen: BS +, non-tender, non-distended Ext: no clubbing, cyanosis, or edema Psych: appeared calm GU: + Nephrostomy drain Skin: Warm and dry.  Intact. Psych: Flat.  Limited engagement. Musc: RUE/RLE: With mild edema, stable.  No tenderness in extremities. Neuro: Alert Continued global aphasia Motor: 0/5 RUE, 2-3 R Quad, and HF, 0 at ankle/foot, DTR's 3+ on right, 4-5 beats of clonus   Assessment/Plan: 1. Functional deficits which require 3+ hours per day of interdisciplinary therapy in a comprehensive inpatient rehab setting.  Physiatrist is providing close team supervision and 24 hour management of active medical problems listed below.  Physiatrist and rehab team continue to assess barriers to discharge/monitor patient progress toward functional and medical goals   Care Tool:  Bathing  Bathing activity did not occur: Refused Body  parts bathed by patient: Chest,Abdomen,Right upper leg,Left upper leg,Left lower leg,Face,Right arm,Front perineal area,Buttocks,Right lower leg   Body parts bathed by helper: Left arm     Bathing assist Assist Level: Minimal Assistance - Patient > 75%     Upper Body Dressing/Undressing Upper body dressing   What is the patient wearing?: Pull over shirt    Upper body assist Assist Level: Minimal Assistance - Patient > 75%    Lower Body Dressing/Undressing Lower body dressing      What is the patient wearing?: Incontinence brief,Pants     Lower body assist Assist for lower body dressing: Moderate Assistance - Patient 50 - 74%     Toileting Toileting    Toileting assist Assist for toileting: Moderate Assistance - Patient 50 - 74%     Transfers Chair/bed transfer  Transfers assist     Chair/bed transfer assist level: Minimal Assistance - Patient > 75%     Locomotion Ambulation   Ambulation assist   Ambulation activity did not occur: Safety/medical concerns  Assist level: Contact Guard/Touching assist Assistive device: Cane-quad Max distance: 80   Walk 10 feet activity   Assist  Walk 10 feet activity did not occur: Safety/medical concerns  Assist level: Contact Guard/Touching assist Assistive device: Cane-quad   Walk 50 feet activity   Assist Walk 50 feet with 2 turns activity did not occur: Safety/medical concerns  Assist level: Contact Guard/Touching assist Assistive device: Cane-quad    Walk 150 feet activity   Assist Walk 150 feet activity did not occur: Safety/medical concerns  Walk 10 feet on uneven surface  activity   Assist Walk 10 feet on uneven surfaces activity did not occur: Safety/medical concerns         Wheelchair     Assist Will patient use wheelchair at discharge?: Yes Type of Wheelchair: Manual    Wheelchair assist level: Supervision/Verbal cueing Max wheelchair distance: 150    Wheelchair 50  feet with 2 turns activity    Assist        Assist Level: Supervision/Verbal cueing   Wheelchair 150 feet activity     Assist      Assist Level: Supervision/Verbal cueing    Medical Problem List and Plan: 1.  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  Continue CIR PT, OT, SLP Baseline is severe expressive aphasia/apraxia Able to amb 500' SBQC with supervision per last OP PT note 5/10 Asked IR if pt can shower with urostomy   -5/28 family ed today 2.  Antithrombotics: -DVT/anticoagulation:  Mechanical: Sequential compression devices, below knee Bilateral lower extremities             -antiplatelet therapy: N/A 3. 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   Appears to be controlled on 5/28    4. Mood: LCSW to follow for evaluation and support.              -antipsychotic agents: N/A 5. Neuropsych: This patient is not capable of making decisions on her own behalf. 6. Skin/Wound Care: Routine pressure relief measures.  7. Fluids/Electrolytes/Nutrition: Monitor I/Os.  8. E.coli UTI: antibiotics narrowed to IV ceftriaxone, discussed with pharmacy changed to Keflex-completed .  9. Left emphysematous pyelitis s/p nephrostomy: Continue to monitor output dily.              --Radiology following for input.   Foley DC'd, voiding trial---mostly continent 10. Leucocytosis:  .               resolved on Keflex  Afebrile    11. Thrombocytopenia:   Platelets 287 on 5/23, improving  12. Abnormal LFTs:              LFTs elevated on 5/21  Continue to monitor 13. Acute kidney injury: resolved, obstructive uropathy, s/p ureteral stent per IR   Creatinine 1.01 on 5/23  Continue to monitor 14. HTN:  Hypotension has resolved. Continue to hold norvasc.    Vitals:   12/23/20 1948  12/24/20 0618  BP: 127/76 119/73  Pulse: (!) 101 96  Resp: 18 17  Temp: 98.2 F (36.8 C) 98 F (36.7 C)  SpO2: 100% 96%  Controlled 5/28             Monitor his increase mobility 15. Adjustment disorder: Continue Seroquel bid. 16. Stress induced hyperglycemia: Hgb A1C-5.2 but BS labile             --Continue to monitor with ac/hs CBG checks.           -reasonable control of cbg's 17.  Hyponatremia  Sodium 133 on 5/23  Continue to monitor  LOS: 8 days A FACE TO [facility] 12/24/2020, 10:52 AM

## 2020-12-24 NOTE — Progress Notes (Signed)
Patient's husband came to nurses station stating he was here for family education this morning. Upon reading back on the notes, family education was scheduled for yesterday. Patient's husband said he spoke to Education officer, museum and said he was working Friday and that doesn't work for him, so they agreed on Saturday. Explained to patient's husband that there is no family education scheduled for today and it would have to be rescheduled once he spoke to social worker on Monday.

## 2020-12-24 NOTE — Progress Notes (Signed)
Physical Therapy Session Note  Patient Details  Name: Melanie Madden MRN: [id number] Date of Birth: 04/12/1966  Today's Date: 12/24/2020 PT Individual Time: 1300-1330 PT Individual Time Calculation (min): 30 min  and Today's Date: 12/24/2020 PT Missed Time: 30 Minutes Missed Time Reason: Patient unwilling to participate  Short Term Goals: Week 1:  PT Short Term Goal 1 (Week 1): Pt will perform bed mobility with mod assist PT Short Term Goal 2 (Week 1): Pt will preformed bed<>WC transfer with mod assist PT Short Term Goal 3 (Week 1): Pt will perform gait training with LRAD for 33ft with mod assit and +2 for safety  Skilled Therapeutic Interventions/Progress Updates:    Patient received supine in bed, husband at bedside. Patient not agreeable to OOB therapy, but husband present for family education. Patient denies pain, but nods her head "yes" to feeling tired. PT discussed with husband CLOF, need for use of AFO and SBQC + CGA for all transfers and ambulation. PT noting to husband that patients endurance is diminished compared to previous baseline prior to this hospitalization. Husband with multiple questions regarding nephrostomy site and care. PT deferring questions to RN/MD except to say that when patient is transferring/ambulating to have bag pinned to shirt to prevent tension on tubing. Husband verbalized understanding. Through discussion with husband, patient with limited engagement and would often begin arguing with husband when he would report on her PLOF. Husband also with multiple questions regarding medications and how to manage assessing function of L kidney. PT again deferring to RN/MD to answer those questions appropriately. PT emphasizing return to OP PT when able to resume care and progress gait mechanics and endurance. PT also noting to husband that despite use of AFO, patients R knee continues to hyperextend and it may be worthwhile to have OP PT assess for need of  new/different AFO. Husband verbalized understanding. Husband appearing overwhelmed with coordinating patients follow up appointments and notes that patient often becomes very upset when told she has to go to the dr. PT recommending that husband reach out to SW regarding community resources that may assist him with decision making/care coordination once patient is dc'd from the hospital. Patient refusing to participate in further discussion/participate in therapy and husband without further questions at this time. Patient remaining in bed, bed alarm on, call light within reach.   Therapy Documentation Precautions:  Precautions Precautions: Fall Precaution Comments: global aphasic, residual R hemiplegia, urostomy Required Braces or Orthoses: Other Brace (AFO for the right foot) Restrictions Weight Bearing Restrictions: No    Therapy/Group: Individual Therapy  Karoline Caldwell, PT, DPT, CBIS  12/24/2020, 9:36 AM

## 2020-12-25 LAB — GLUCOSE, CAPILLARY
Glucose-Capillary: 107 mg/dL — ABNORMAL HIGH (ref 70–99)
Glucose-Capillary: 136 mg/dL — ABNORMAL HIGH (ref 70–99)
Glucose-Capillary: 152 mg/dL — ABNORMAL HIGH (ref 70–99)
Glucose-Capillary: 204 mg/dL — ABNORMAL HIGH (ref 70–99)

## 2020-12-25 NOTE — Plan of Care (Signed)
  Problem: Consults Goal: RH GENERAL PATIENT EDUCATION Description: See Patient Education module for education specifics. Outcome: Progressing   Problem: RH BOWEL ELIMINATION Goal: RH STG MANAGE BOWEL WITH ASSISTANCE Description: STG Manage Bowel with mod  I Assistance. Outcome: Progressing   Problem: RH BLADDER ELIMINATION Goal: RH STG MANAGE BLADDER WITH ASSISTANCE Description: STG Manage Bladder With  mod I Assistance Outcome: Progressing   Problem: RH SAFETY Goal: RH STG ADHERE TO SAFETY PRECAUTIONS W/ASSISTANCE/DEVICE Description: STG Adhere to Safety Precautions With cues/reminders Assistance/Device. Outcome: Progressing   Problem: RH KNOWLEDGE DEFICIT GENERAL Goal: RH STG INCREASE KNOWLEDGE OF SELF CARE AFTER HOSPITALIZATION Description: Patient will be able to direct and spouse/caregiver provide care at discharge independently using handouts and educational resources Outcome: Progressing

## 2020-12-26 LAB — CBC
HCT: 29 % — ABNORMAL LOW (ref 36.0–46.0)
Hemoglobin: 9.6 g/dL — ABNORMAL LOW (ref 12.0–15.0)
MCH: 29.3 pg (ref 26.0–34.0)
MCHC: 33.1 g/dL (ref 30.0–36.0)
MCV: 88.4 fL (ref 80.0–100.0)
Platelets: 377 10*3/uL (ref 150–400)
RBC: 3.28 MIL/uL — ABNORMAL LOW (ref 3.87–5.11)
RDW: 13.4 % (ref 11.5–15.5)
WBC: 7.5 10*3/uL (ref 4.0–10.5)
nRBC: 0 % (ref 0.0–0.2)

## 2020-12-26 LAB — BASIC METABOLIC PANEL
Anion gap: 7 (ref 5–15)
BUN: 17 mg/dL (ref 6–20)
CO2: 24 mmol/L (ref 22–32)
Calcium: 9 mg/dL (ref 8.9–10.3)
Chloride: 103 mmol/L (ref 98–111)
Creatinine, Ser: 0.89 mg/dL (ref 0.44–1.00)
GFR, Estimated: >60 mL/min (ref >60)
Glucose, Bld: 296 mg/dL — ABNORMAL HIGH (ref 70–99)
Potassium: 3.3 mmol/L — ABNORMAL LOW (ref 3.5–5.1)
Sodium: 134 mmol/L — ABNORMAL LOW (ref 135–145)

## 2020-12-26 LAB — GLUCOSE, CAPILLARY
Glucose-Capillary: 107 mg/dL — ABNORMAL HIGH (ref 70–99)
Glucose-Capillary: 116 mg/dL — ABNORMAL HIGH (ref 70–99)
Glucose-Capillary: 115 mg/dL — ABNORMAL HIGH (ref 70–99)
Glucose-Capillary: 145 mg/dL — ABNORMAL HIGH (ref 70–99)

## 2020-12-26 NOTE — Progress Notes (Signed)
Physical Therapy Session Note  Patient Details  Name: Melanie Madden MRN: [id number] Date of Birth: 01-09-1966  Today's Date: 12/26/2020 PT Individual Time: 8280-0349 PT Individual Time Calculation (min): 25 min   Short Term Goals: Week 1:  PT Short Term Goal 1 (Week 1): Pt will perform bed mobility with mod assist PT Short Term Goal 2 (Week 1): Pt will preformed bed<>WC transfer with mod assist PT Short Term Goal 3 (Week 1): Pt will perform gait training with LRAD for 32ft with mod assit and +2 for safety  Skilled Therapeutic Interventions/Progress Updates:    Pt received sitting in w/c and appears agreeable to therapy session (pt with severe expressive aphasia). L hemi-technique w/c propulsion ~22ft towards main therapy gym with supervision. Pt appears fatigued therefore therapist transported pt remainder of distance to main gym. Pt continues to refuse participation in stair navigation training with pt adamantly and repeatedly stating "no, no, no." Redirected pt to participate in gait training using narrow based QC; however, pt also refuses but not as strongly as with the stairs. Therapist inquired upon pt's reasoning and with gestures and simple questioning able to gather that pt is currently feeling constipated and is having R knee pain. Thearpist educated pt on need to assess gait mechanics to provide additional information as to why may be experiencing R knee pain; discussed how other therapist noted some hyperextension and suggested pt may need a different AFO or modifications to current AFO. Pt continues to decline participation in gait training at this time. Transported back to room. Notified RN of pt's symptoms to provide medications as appropriate. Pt requesting for assistance back to bed. R squat pivot w/c>EOB min assist for balance. Doffed B shoes and AFO. Sit>supine with min assist for R LE management into bed. Pt left supine in bed with needs in reach, HOB elevated, R UE  therapeutically positioned on pillows, and bed alarm on.  Therapy Documentation Precautions:  Precautions Precautions: Fall Precaution Comments: global aphasic, residual R hemiplegia, urostomy Required Braces or Orthoses: Other Brace Other Brace: R AFO Restrictions Weight Bearing Restrictions: No  Pain: Reporting R knee pain - therapist palpated with pt appearing to be tender along medial border of the joint line but difficult to determine due to pt's expressive aphasia. Details above.   Therapy/Group: Individual Therapy  Tawana Scale , PT, DPT, CSRS  12/26/2020, 3:14 PM

## 2020-12-26 NOTE — Progress Notes (Signed)
Patient refused blood work this morning. Lab attempted 2x. Rn told lab to come back at 8am when natural light is better as patient refused the lights to be turned on. Will let day RN know to follow up.

## 2020-12-26 NOTE — Progress Notes (Signed)
Patient husband was instructed on emptying nephrostomy tube and care of the nephrostomy site. He demonstrated successful emptying of the nephrostomy tube.

## 2020-12-26 NOTE — Progress Notes (Signed)
[facility] PHYSICAL MEDICINE & REHABILITATION PROGRESS NOTE  Subjective/Complaints:   ROS: limited due to language/communication    Objective: Vital Signs: Blood pressure 124/76, pulse 91, temperature 98.4 F (36.9 C), temperature source Oral, resp. rate 18, weight 62.6 kg, SpO2 98 %. No results found. Recent Labs    12/26/20 0933  WBC 7.5  HGB 9.6*  HCT 29.0*  PLT 377   Recent Labs    12/26/20 0933  NA 134*  K 3.3*  CL 103  CO2 24  GLUCOSE 296*  BUN 17  CREATININE 0.89  CALCIUM 9.0    Intake/Output Summary (Last 24 hours) at 12/26/2020 1057 Last data filed at 12/26/2020 0800 Gross per 24 hour  Intake 936 ml  Output 1050 ml  Net -114 ml        Physical Exam: BP 124/76 (BP Location: Left Arm)   Pulse 91   Temp 98.4 F (36.9 C) (Oral)   Resp 18   Wt 62.6 kg   SpO2 98%   BMI 21.62 kg/m   General: No acute distress Mood and affect are appropriate Heart: Regular rate and rhythm no rubs murmurs or extra sounds Lungs: Clear to auscultation, breathing unlabored, no rales or wheezes Abdomen: Positive bowel sounds, soft nontender to palpation, nondistended Extremities: No clubbing, cyanosis, or edema Skin: No evidence of breakdown, no evidence of rash  GU: + Nephrostomy drain Skin: Warm and dry.  Intact. Psych: Flat.  Limited engagement. Musc: RUE/RLE: With mild edema, stable.  No tenderness in extremities. Neuro: Alert Continued global aphasia Motor: 0/5 RUE, 2-3 R Quad, and HF, 0 at ankle/foot, DTR's 3+ on right, 4-5 beats of clonus   Assessment/Plan: 1. Functional deficits which require 3+ hours per day of interdisciplinary therapy in a comprehensive inpatient rehab setting.  Physiatrist is providing close team supervision and 24 hour management of active medical problems listed below.  Physiatrist and rehab team continue to assess barriers to discharge/monitor patient progress toward functional and medical goals   Care Tool:  Bathing   Bathing activity did not occur: Refused Body parts bathed by patient: Chest,Abdomen,Right upper leg,Left upper leg,Left lower leg,Face,Right arm,Front perineal area,Buttocks,Right lower leg   Body parts bathed by helper: Left arm     Bathing assist Assist Level: Minimal Assistance - Patient > 75%     Upper Body Dressing/Undressing Upper body dressing   What is the patient wearing?: Pull over shirt    Upper body assist Assist Level: Minimal Assistance - Patient > 75%    Lower Body Dressing/Undressing Lower body dressing      What is the patient wearing?: Incontinence brief,Pants     Lower body assist Assist for lower body dressing: Moderate Assistance - Patient 50 - 74%     Toileting Toileting    Toileting assist Assist for toileting: Moderate Assistance - Patient 50 - 74%     Transfers Chair/bed transfer  Transfers assist     Chair/bed transfer assist level: Minimal Assistance - Patient > 75%     Locomotion Ambulation   Ambulation assist   Ambulation activity did not occur: Safety/medical concerns  Assist level: Contact Guard/Touching assist Assistive device: Cane-quad Max distance: 80   Walk 10 feet activity   Assist  Walk 10 feet activity did not occur: Safety/medical concerns  Assist level: Contact Guard/Touching assist Assistive device: Cane-quad   Walk 50 feet activity   Assist Walk 50 feet with 2 turns activity did not occur: Safety/medical concerns  Assist level: Contact Guard/Touching assist  Assistive device: Cane-quad    Walk 150 feet activity   Assist Walk 150 feet activity did not occur: Safety/medical concerns         Walk 10 feet on uneven surface  activity   Assist Walk 10 feet on uneven surfaces activity did not occur: Safety/medical concerns         Wheelchair     Assist Will patient use wheelchair at discharge?: Yes Type of Wheelchair: Manual    Wheelchair assist level: Supervision/Verbal cueing Max  wheelchair distance: 150    Wheelchair 50 feet with 2 turns activity    Assist        Assist Level: Supervision/Verbal cueing   Wheelchair 150 feet activity     Assist      Assist Level: Supervision/Verbal cueing    Medical Problem List and Plan: 1.  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  Antithrombotics: -DVT/anticoagulation:  Mechanical: Sequential compression devices, below knee Bilateral lower extremities             -antiplatelet therapy: N/A 3. 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      4. Mood: LCSW to follow for evaluation and support.              -antipsychotic agents: N/A 5. Neuropsych: This patient is not capable of making decisions on her own behalf. 6. Skin/Wound Care: Routine pressure relief measures.  7. Fluids/Electrolytes/Nutrition: Monitor I/Os.  8. E.coli UTI: antibiotics narrowed to IV ceftriaxone, discussed with pharmacy changed to Keflex-completed .  9. Left emphysematous pyelitis s/p nephrostomy: Continue to monitor output dily.              --Radiology following for input.   Foley DC'd, voiding trial---mostly continent 10. Leucocytosis:  .               resolved on Keflex  Afebrile    11. Thrombocytopenia:   Resolved PLT 377K 12. Abnormal LFTs:              LFTs elevated on 5/21  Continue to monitor 13. Acute kidney injury: resolved, obstructive uropathy, s/p ureteral stent per IR   Creatinine 1.01 on 5/23 now nl at 0.89  Continue to monitor 14. HTN:  Hypotension has resolved. Continue to hold norvasc.    Vitals:   12/25/20 1933 12/26/20 0549  BP: (!) 144/86 124/76   Pulse: (!) 110 91  Resp: 18 18  Temp: 98.5 F (36.9 C) 98.4 F (36.9 C)  SpO2: 97% 98%  Controlled 5/30             Monitor his increase mobility 15. Adjustment disorder: Continue Seroquel bid. 16. Stress induced hyperglycemia: Hgb A1C-5.2 but BS labile             --Continue to monitor with ac/hs CBG checks.           -reasonable control of cbg's 17.  Hyponatremia  Sodium 133 on 5/23  Continue to monitor 18.  Bowel incont- likely multifactorial communication a major issue LOS: 10 days A FACE TO FACE EVALUATION WAS PERFORMED  Melanie Madden 12/26/2020, 10:57 AM

## 2020-12-26 NOTE — Progress Notes (Signed)
Physical Therapy Discharge Summary  Patient Details  Name: Melanie Madden MRN: [id number] Date of Birth: 04-05-1966   Today's Date: 12/26/2020 PT Individual Time: 1051-1149 PT Individual Time Calculation (min): 58 min    Daily Session: Patient received supine in bed, NT present, agreeable to therapy and reporting that she had been incontinent. She denies pain. Patient able to roll R with supervision and L with up to [place] to complete bed level pericare. Patient incontinent of bowel. NT draining nephrostomy bag for session. Patient donning pants with MaxA and encouragement to participate in pulling up her pants. Patient coming to sit edge of bed with supervision and HOB slightly elevated. Verbal cues for sequencing. Patient insisting on donning shoes herself, including AFO. Extended time provided. Patient transferring to wc via squat pivot with CGA. She was able to propel herself in wc using L hemi technique and supervision. Patient ambulating 37f with SBQC and CGA/MinA. Patient requiring slightly more assist this AM, likely due to fatigue. PT unable to discern whether patient slept well or was fatigued due to patients aphasia. Patient refusing to attempt stairs during this session and is reporting that she doesn't have stairs to enter her house and a chairlift inside her house. Husband not present to confirm information. Patient returning to her room in wc, seatbelt alarm on, call light within reach.   Patient has met 10 of 10 long term goals due to improved activity tolerance, improved balance, improved postural control, increased strength, ability to compensate for deficits, functional use of  right lower extremity, improved attention, improved awareness and improved coordination.  Patient to discharge at an ambulatory level [name]   Patient's care partner caregiver present when husband is not to provide the necessary physical and cognitive assistance at discharge.   Recommendation:   Patient will benefit from ongoing skilled PT services in outpatient setting to continue to advance safe functional mobility, address ongoing impairments in gait mechanics, dynamic balance, endurance, and minimize fall risk.  Equipment: No equipment provided  Reasons for discharge: treatment goals met and discharge from hospital  Patient/family agrees with progress made and goals achieved: Yes  PT Discharge Precautions/Restrictions Precautions Precautions: Fall Precaution Comments: global aphasic, residual R hemiplegia, urostomy Required Braces or Orthoses: Other Brace Other Brace: R AFO Restrictions Weight Bearing Restrictions: No Vision/Perception  Perception Perception: Impaired Praxis Praxis: Impaired Praxis Impairment Details: Motor planning  Cognition Overall Cognitive Status: History of cognitive impairments - at baseline Arousal/Alertness: Awake/alert Orientation Level: Oriented to person;Oriented to place Sustained Attention: Impaired Memory: Impaired Awareness: Impaired Problem Solving: Impaired Behaviors: Impulsive;Verbal agitation;Perseveration;Poor frustration tolerance Safety/Judgment: Impaired Sensation Sensation Light Touch: Impaired by gross assessment Light Touch Impaired Details: Impaired RLE;Impaired RUE Hot/Cold: Appears Intact Proprioception: Impaired by gross assessment;Impaired Detail Proprioception Impaired Details: Impaired RLE Stereognosis: Appears Intact Coordination Gross Motor Movements are Fluid and Coordinated: No Fine Motor Movements are Fluid and Coordinated: No Coordination and Movement Description: dense R hemiplegia UE>LE at baseline Heel Shin Test: unable R LE Motor  Motor Motor: Hemiplegia;Abnormal postural alignment and control;Abnormal tone Motor - Skilled Clinical Observations: dense-flaccid R hemi UE and trace movement noted in the RLE Motor - Discharge Observations: Dense R hemi with trace movement noted only during gait  in RLE  Mobility Bed Mobility Bed Mobility: Rolling Right;Rolling Left;Supine to Sit;Sit to Supine Rolling Right: Independent with assistive device Rolling Left: Moderate Assistance - Patient 50-74% Supine to Sit: Supervision/Verbal cueing Sit to Supine: Supervision/Verbal cueing Transfers Transfers: Sit to Stand;Stand to Sit;Stand Pivot Transfers;Squat  Pivot Transfers Sit to Stand: Contact Guard/Touching assist Stand to Sit: Contact Guard/Touching assist Stand Pivot Transfers: Contact Guard/Touching assist Squat Pivot Transfers: Contact Guard/Touching assist Transfer (Assistive device): Small based quad cane Locomotion  Gait Ambulation: Yes Gait Assistance: Contact Guard/Touching assist Gait Distance (Feet): 55 Feet Assistive device: Small based quad cane Gait Assistance Details: Verbal cues for safe use of DME/AE;Verbal cues for gait pattern Gait Gait: Yes Gait Pattern: Impaired Gait Pattern: Right foot flat;Lateral hip instability;Right hip hike;Right circumduction;Decreased step length - left;Decreased hip/knee flexion - right;Decreased stance time - right;Step-to pattern Gait velocity: decreased Stairs / Additional Locomotion Stairs: No Wheelchair Mobility Wheelchair Mobility: Yes Wheelchair Assistance: Supervision/Verbal cueing Wheelchair Propulsion: Left lower extremity;Left upper extremity Wheelchair Parts Management: Supervision/cueing Distance: 150  Trunk/Postural Assessment  Cervical Assessment Cervical Assessment: Exceptions to [facility] (forward head) Thoracic Assessment Thoracic Assessment: Exceptions to [facility] (increased kyphosis/rounded shoulders) Lumbar Assessment Lumbar Assessment: Exceptions to [facility] (posterior pelvic tilt) Postural Control Postural Control: Deficits on evaluation  Balance Balance Balance Assessed: Yes Static Sitting Balance Static Sitting - Balance Support: Feet supported Static Sitting - Level of Assistance: 5: Stand by assistance Dynamic  Sitting Balance Dynamic Sitting - Balance Support: Feet supported;During functional activity Dynamic Sitting - Level of Assistance: 5: Stand by assistance Static Standing Balance Static Standing - Balance Support: Left upper extremity supported Static Standing - Level of Assistance:  (CGA) Dynamic Standing Balance Dynamic Standing - Balance Support: Left upper extremity supported Dynamic Standing - Level of Assistance:  (CGA) Extremity Assessment      RLE Assessment RLE Assessment: Exceptions to [facility] RLE Strength Right Hip Flexion: 2-/5 Right Hip Extension: 2-/5 Right Hip ABduction: 3-/5 Right Hip ADduction: 3-/5 Right Knee Flexion: 1/5 Right Knee Extension: 2-/5 Right Ankle Dorsiflexion: 2-/5 LLE Assessment LLE Assessment: Within Functional Limits General Strength Comments: grossly 4+/5 proximal to distal  Husband present for family ed prior to dc and aware that patient requires CGA for gait and is limited in distance (endurance) compared to baseline prior to this hospitalization.   Debbora Dus 12/26/2020, 11:54 AM

## 2020-12-26 NOTE — Progress Notes (Signed)
Speech Language Pathology Discharge Summary  Patient Details  Name: Melanie Madden MRN: [id number] Date of Birth: 04-22-1966  Today's Date: 12/26/2020 SLP Individual Time: 1025-8527 SLP Individual Time Calculation (min): 28 min   Skilled Therapeutic Interventions:  Skilled ST services focused on speech skills. Pt demonstrated response to yes/no questions pertaining to 6 field communication board, reading word level on white board and limited use of alphabet board. Pt gestured to not use multimodal devices and confirmed with yes/no response. SLP facilitated articulation and expression of word with initial bilabial and alveloar sounds. Pt produced initial /b/ CV words with 25% accuracy substituting /b/ or /d/, initial /b/ CVC words with 40% accuracy substituting /b/ for /d/ and final constant deletion noted on 1 out 5 words. Pt produced initial /p/ CVC words with 25% accuracy substituting /p/ for /d/ and /t/. Pt produced initial /d/ CV words with 75% accuracy and CVC words with 60% accuracy noting final constant deletion on 2 out 5 words. Pt produced initial /p/ words with 57% accuracy noting final constant deletion and assimilation. SLP provided education to continue use of multimodal communication, yes/no response and visual/written communication with patient. Pt was left in room with call bell within reach and bed alarm set. SLP recommends to continue skilled services.   Patient has met 2 of 4 long term goals.  Patient to discharge at overall Min;Supervision level.  Reasons goals not met: reduced participation, focus on communication goals   Clinical Impression/Discharge Summary:   Pt made moderate progress meeting 2 out 4 goals, discharging at min-supervision A for communication and cognitive skills. Pt primarily communicate in yes/no response, however favors "yeah" and needs clarification to confirm comprehension. Pt can follow 1 step commands, basic problem solving with mod-min A  verbal cues and sustained attention to task are limited by reduced participation and desire to focus on articulation skills. Pt's current articulation skills are listed above. Education was not completed at discharge with husband, he did not attend planned education session over the weekend. ST communicated with husband via phone in previous session. SLP recommends to continue communication and cognitive skills via outpatient services and recommend 24 hour supervision.   Care Partner:  Caregiver Able to Provide Assistance: Yes  Type of Caregiver Assistance: Physical;Cognitive  Recommendation:  Outpatient SLP;24 hour supervision/assistance  Rationale for SLP Follow Up: Maximize functional communication;Maximize cognitive function and independence;Reduce caregiver burden   Equipment: N/A   Reasons for discharge: Discharged from hospital   Patient/Family Agrees with Progress Made and Goals Achieved: Yes    Huma Imhoff  [facility] 12/26/2020, 2:31 PM

## 2020-12-26 NOTE — Progress Notes (Signed)
Occupational Therapy Discharge Summary  Patient Details  Name: Melanie Madden MRN: [id number] Date of Birth: [birth year]  Today's Date: 12/26/2020 OT Individual Time: 1302-1402 OT Individual Time Calculation (min): 60 min   Session Note:  Pt up in wheelchair to start session, noted from MD note that pt is allowed to shower with nephrostomy site taped up.  She was agreeable to shower with min assist transfer over to the tub bench stand pivot from the wheelchair.  She was assisted with undressing secondary to time and then completed bathing at min assist overall.  Max hand over hand assist was needed for integration of the RUE into task to wash the left arm and underarm area.  She needed min instructional cueing to sequence with max assist for setup of washcloth as well.  Min assist for standing with min assist for thoroughness when washing her buttocks.  She dried off and then transferred stand pivot to the wheelchair with mod assist and no AFO in place.  Right LE goes into knee hyperextension as well as inversion if it is not stabilized successfully.  She completed UB dressing at supervision level and donned her brief and pants at mod assist level sit to stand.  Mod assist was also needed for donning TEDs, shoes, and AFO on the right foot.  She was left sitting up in the wheelchair with the call button and phone in reach and safety belt in place.    Patient has met 8 of 9 long term goals due to improved activity tolerance, improved balance, postural control and ability to compensate for deficits.  Patient to discharge at [facility] Assist level.  Patient's care partner is independent to provide the necessary physical and cognitive assistance at discharge.  Family did not formally attend education sessions scheduled per SW and were not present today.  No family education completed by OT.  Reasons goals not met: She continues to need mod assist for LB dressing tasks.  Recommendation:   Patient will benefit from ongoing skilled OT services in outpatient setting to continue to advance functional skills in the area of BADL and Reduce care partner burden.  Pt will benefit from outpatient OT eval and treat for continued progression back to supervision level for selfcare tasks as well as increasing RUE function to a possible stabilizer level.  Recommend 24 hour min assist at discharge.    Equipment: No equipment provided  Reasons for discharge: treatment goals met and discharge from hospital  Patient/family agrees with progress made and goals achieved: Yes  OT Discharge Precautions/Restrictions  Precautions Precautions: Fall Precaution Comments: global aphasic, residual R hemiplegia, urostomy Required Braces or Orthoses: Other Brace Other Brace: R AFO Restrictions Weight Bearing Restrictions: No  Pain Pain Assessment Pain Scale: Faces Pain Score: 0-No pain Faces Pain Scale: Hurts little more ADL ADL Eating: Set up Where Assessed-Eating: Wheelchair Grooming: Supervision/safety Where Assessed-Grooming: Wheelchair Upper Body Bathing: Minimal assistance Where Assessed-Upper Body Bathing: Chair,Shower Lower Body Bathing: Minimal assistance Where Assessed-Lower Body Bathing: Chair,Shower Upper Body Dressing: Supervision/safety Where Assessed-Upper Body Dressing: Wheelchair Lower Body Dressing: Moderate assistance Where Assessed-Lower Body Dressing: Wheelchair,Sitting at sink,Standing at sink Toileting: Moderate assistance Where Assessed-Toileting: Medical laboratory scientific officer: Minimal Print production planner Method: Arts development officer: Radiographer, therapeutic: Maximal Museum/gallery conservator Method: Administrator, arts: Facilities manager: Environmental education officer Method: Radiographer, therapeutic: Gaffer Baseline Vision/History: No  visual deficits Patient Visual Report:  No change from baseline Vision Assessment?: No apparent visual deficits Perception  Perception: Impaired Praxis Praxis: Impaired Praxis Impairment Details: Motor planning Cognition Overall Cognitive Status: History of cognitive impairments - at baseline Arousal/Alertness: Awake/alert Attention: Sustained Sustained Attention: Impaired Sustained Attention Impairment: Functional basic Memory: Impaired Awareness: Impaired Awareness Impairment: Emergent impairment;Anticipatory impairment (Pt resistant to trying new ways outside of her learned compensation) Problem Solving: Impaired Problem Solving Impairment: Functional basic Behaviors: Impulsive;Verbal agitation;Perseveration;Poor frustration tolerance Safety/Judgment: Impaired Comments: 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  More impaired with expressive deficits compared to receptive. Sensation Sensation Light Touch: Impaired Detail Light Touch Impaired Details: Impaired RLE;Impaired RUE Hot/Cold: Not tested Proprioception: Impaired Detail Proprioception Impaired Details: Impaired RUE Stereognosis: Not tested Coordination Gross Motor Movements are Fluid and Coordinated: No Fine Motor Movements are Fluid and Coordinated: No Coordination and Movement Description: dense R hemiplegia with only trace shoulder movement noted Motor  Motor Motor: Hemiplegia;Abnormal postural alignment and control;Abnormal tone Motor - Discharge Observations: Dense R hemi with trace movement noted in the RUE and RLE Mobility  Bed Mobility Bed Mobility: Supine to Sit Supine to Sit: Minimal Assistance - Patient > 75% Transfers Sit to Stand: Minimal Assistance - Patient > 75% Stand to Sit: Minimal Assistance - Patient > 75%  Trunk/Postural Assessment  Cervical Assessment Cervical Assessment: Exceptions to [facility] (forward head) Thoracic Assessment Thoracic Assessment: Exceptions to [facility] (rounded  posture with right scapular elevation) Lumbar Assessment Lumbar Assessment: Exceptions to [facility] (posterior pelvic tilt)  Balance Balance Balance Assessed: Yes Static Sitting Balance Static Sitting - Balance Support: Feet supported Static Sitting - Level of Assistance: 5: Stand by assistance Dynamic Sitting Balance Dynamic Sitting - Balance Support: During functional activity Dynamic Sitting - Level of Assistance: 5: Stand by assistance Static Standing Balance Static Standing - Balance Support: During functional activity;Left upper extremity supported Static Standing - Level of Assistance:  (min contact guard) Dynamic Standing Balance Dynamic Standing - Balance Support: Left upper extremity supported;During functional activity Dynamic Standing - Level of Assistance: 4: Min assist Extremity/Trunk Assessment RUE Assessment RUE Assessment: Exceptions to [facility] Passive Range of Motion (PROM) Comments: WFLS for all joints General Strength Comments: trace shoulder movements noted at Brunnstrum stage II with stage I in the arm.  Two finger inferior, anterior subluxation noted. RUE Body System: Neuro Brunstrum levels for arm and hand: Arm;Hand Brunstrum level for arm: Stage II Synergy is developing Brunstrum level for hand: Stage I Flaccidity LUE Assessment LUE Assessment: Within Functional Limits   , OTR/L 12/26/2020, 4:36 PM

## 2020-12-27 ENCOUNTER — Inpatient Hospital Stay (HOSPITAL_COMMUNITY): Payer: Managed Care, Other (non HMO)

## 2020-12-27 LAB — GLUCOSE, CAPILLARY: Glucose-Capillary: 105 mg/dL — ABNORMAL HIGH (ref 70–99)

## 2020-12-27 MED ORDER — POTASSIUM CHLORIDE CRYS ER 20 MEQ PO TBCR
30.0000 meq | EXTENDED_RELEASE_TABLET | Freq: Once | ORAL | Status: AC
  Administered 2020-12-27: 30 meq via ORAL
  Filled 2020-12-27: qty 1

## 2020-12-27 MED ORDER — DOCUSATE SODIUM 100 MG PO CAPS: 200.0000 mg | ORAL_CAPSULE | Freq: Every day | ORAL | 0 refills | Status: DC

## 2020-12-27 NOTE — Discharge Summary (Signed)
Physician Discharge Summary  Patient ID: Melanie Madden MRN: [id number] DOB/AGE: [birth year] 55 y.o.  Admit date: 12/16/2020 Discharge date: 12/27/2020  Discharge Diagnoses:  Principal Problem:   Debility Active Problems:   Hyponatremia   Urinary retention   E. coli UTI   Discharged Condition: stable  Significant Diagnostic Studies: DG Abd 1 View  Result Date: 12/27/2020 CLINICAL DATA:  Abdominal pain.  History of kidney stones. EXAM: ABDOMEN - 1 VIEW COMPARISON:  IR images 12/09/2020.  CT 12/09/2020. FINDINGS: Right nephrostomy catheter noted in good anatomic position. No bowel distention. No free air. Degenerative changes scoliosis lumbar spine. Degenerative changes both hips. Pelvic calcifications consistent phleboliths. IMPRESSION: Right nephrostomy catheter noted in good anatomic position. Electronically Signed   By: Marcello Moores  Register   On: 12/27/2020 10:18    Labs:  Basic Metabolic Panel: BMP Latest Ref Rng & Units 12/26/2020 12/19/2020 12/17/2020  Glucose 70 - 99 mg/dL 296(H) 121(H) 122(H)  BUN 6 - 20 mg/dL 17 35(H) 34(H)  Creatinine 0.44 - 1.00 mg/dL 0.89 1.01(H) 1.00  Sodium 135 - 145 mmol/L 134(L) 133(L) 133(L)  Potassium 3.5 - 5.1 mmol/L 3.3(L) 4.1 4.8  Chloride 98 - 111 mmol/L 103 103 102  CO2 22 - 32 mmol/L 24 23 21(L)  Calcium 8.9 - 10.3 mg/dL 9.0 9.0 9.0    CBC: CBC Latest Ref Rng & Units 12/26/2020 12/19/2020 12/17/2020  WBC 4.0 - 10.5 K/uL 7.5 9.7 14.0(H)  Hemoglobin 12.0 - 15.0 g/dL 9.6(L) 10.9(L) 10.8(L)  Hematocrit 36.0 - 46.0 % 29.0(L) 32.7(L) 31.8(L)  Platelets 150 - 400 K/uL 377 287 151    CBG: Recent Labs  Lab 12/26/20 0556 12/26/20 1151 12/26/20 1646 12/26/20 2141 12/27/20 0615  GLUCAP 107* 116* 115* 145* 105*    Brief HPI:   Melanie Madden is a 55 y.o. 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  She was noted to be hypotensive and tachycardic due to septic shock secondary to emphysematous pyelitis and AKI due to obstructive uropathy.  She was treated with fluids, pressors broad-spectrum antibiotics and right nephrostomy tube placed by interventional radiology.  Blood cultures 2/4 were positive for Enterobacter and E. coli.  Dr. Camille Bal recommended supportive care with outpatient follow-up for stone extraction in 2 to 3 weeks once infection cleared.  Patient has had issues with intermittent hypokalemia, hyper glycemia due to poor intake as well as thrombocytopenia due to sepsis.  Therapies were initiated and patient was noted to have deficits in mobility as well as ability to carry out ADLs.  CIR was recommended due to functional decline.   Hospital Course: Melanie Madden was admitted to rehab 12/16/2020 for inpatient therapies to consist of PT, ST and OT at least three hours five days a week. Past admission physiatrist, therapy team and rehab RN have worked together to provide customized collaborative inpatient rehab.  Her blood pressures were monitored on TID basis and had been stable without recurrent hypotension.  Serial check of electrolytes shows AKI has resolved.  She was noted to have recurrent hypokalemia which was supplemented x1.  Mild hyponatremia stable.  Husband was educated on increasing high potassium foods as well as follow-up with PCP for repeat labs in 1 to 2 weeks.  Follow-up CBC showed H&H to be stable and thrombocytopenia has resolved.  Rocephin was changed to Keflex at admission and she completed her antibiotic course  on 05/27 without side effects.  Foley was removed at discharge per urology input and patient was noted to be voiding without difficulty.  Stress-induced hyperglycemia was monitored with ac/hs CBG checks and blood sugars relatively stable.  Voltaren gel was added to help with right knee pain.  She continues to have 300 to 500 cc drainage get  urostomy.  She did report diffuse abdominal discomfort on day of discharge.  KUB done showed no evidence of obstruction and right nephrostomy catheter was noted in good and Melanie Madden position.  Symptoms felt to be due to bloating and simethicone was recommended to help manage symptoms.  Patient has progressed to min assist level and will resume outpatient therapy after discharge.   Rehab course: During patient's stay in rehab weekly team conferences were held to monitor patient's progress, set goals and discuss barriers to discharge. At admission, patient required max assist with mobility and with ADL tasks. She was able to use gestures and Grunz to communicate wants and needs but speech was essentially unintelligible due to apraxia with groping. She  has had improvement in activity tolerance, balance, postural control as well as ability to compensate for deficits.  She requires min to mod assist for ADL tasks.  She requires contact-guard assist for transfers and is able to ambulate 55 feet with SBQC and contact-guard to min assist.  She requires min assist to supervision for communication and cognitive skills.  She is able to follow one-step basic tasks with min to mod verbal cues.   Discharge disposition: 01-Home or Self Care  Diet: Regular diet.  Monitor carb intake.  Special Instructions: 1.  Recommend repeat be met in 1 to 2 weeks to monitor potassium levels. 2.  Empty urostomy 1-2 times a day.  Document output.  Urology to contact patient regarding follow-up.  Allergies as of 12/27/2020      Reactions   Amoxicillin Nausea Only   Shellfish Allergy Diarrhea, Nausea And Vomiting   Mussels       Medication List    STOP taking these medications   amLODipine 10 MG tablet Commonly known as: NORVASC   VITAMIN E PO   zinc sulfate 220 (50 Zn) MG capsule     TAKE these medications   atorvastatin 20 MG tablet Commonly known as: LIPITOR Take 1 tablet (20 mg total) by mouth daily at 6 PM.    B-complex with vitamin C tablet Take 1 tablet by mouth daily.   CALCIUM PO Take 1 tablet by mouth daily.   cholecalciferol 1000 units tablet Commonly known as: VITAMIN D Take 1,000 Units by mouth daily. 2,000 units daily   docusate sodium 100 MG capsule Commonly known as: COLACE Take 2 capsules (200 mg total) by mouth daily. What changed: how much to take   Fish Oil 1200 MG Cpdr Take 1 capsule by mouth daily.   magnesium 30 MG tablet Take 30 mg by mouth daily.   QUEtiapine 50 MG tablet Commonly known as: SEROquel Take 1 tablet (50 mg total) by mouth 2 (two) times daily.   sertraline 50 MG tablet Commonly known as: Zoloft Take 1 tablet (50 mg total) by mouth daily.       Follow-up Information    Kirsteins, [name], MD Follow up.   Specialty: Physical Medicine and Rehabilitation Why: office will call you with follow up appointment Contact information: [place] [address] [phone]        Fanny Bien, MD. Call on 12/28/2020.  Specialty: Family Medicine Why: for post hospital follow up and repeat labs in one week to check potassium levels Contact information: [address] [place] [address] [phone]        Ceasar Mons, MD Follow up.   Specialty: Urology Why: Office will call you with appointment Contact information: [address] [place]  [address] [phone]               Signed: Bary Leriche 12/27/2020, 11:50 PM

## 2020-12-27 NOTE — Discharge Instructions (Signed)
Inpatient Rehab Discharge Instructions  [facility] Discharge date and time: 12/27/20   Activities/Precautions/ Functional Status: Activity: activity as tolerated Diet: low fat, low cholesterol diet Need to eat more tomatoes, dairy and citrus fruits to help with potassium levels. Wound Care: keep wound clean and dry    Functional status:  ___ No restrictions     ___ Walk up steps independently _X__ 24/7 supervision/assistance   ___ Walk up steps with assistance ___ Intermittent supervision/assistance  ___ Bathe/dress independently ___ Walk with walker     _X__ Bathe/dress with assistance ___ Walk Independently    ___ Shower independently ___ Walk with assistance    ___ Shower with assistance _X__ No alcohol     ___ Return to work/school ________   Special Instructions: 1. Empty urostomy daily. Monitor and document urine color, amount and any changes. Call Dr. Lovena Neighbours with any concerns.      COMMUNITY REFERRALS UPON DISCHARGE:    Outpatient: PT     OT    ST                Agency: [facility]  Phone: [phone]              Appointment Date/Time: TBD     My questions have been answered and I understand these instructions. I will adhere to these goals and the provided educational materials after my discharge from the hospital.  Patient/Caregiver Signature _______________________________ Date __________  Clinician Signature _______________________________________ Date __________  Please bring this form and your medication list with you to all your follow-up doctor's appointments.

## 2020-12-27 NOTE — Progress Notes (Signed)
[facility] PHYSICAL MEDICINE & REHABILITATION PROGRESS NOTE  Subjective/Complaints:  Husband in for d/c- questions about urology  Some lower abd discomfort no vomiting  Yellow draining urine in ureterostomy \ ROS: limited due to language/communication    Objective: Vital Signs: Blood pressure 108/72, pulse 87, temperature 98.3 F (36.8 C), temperature source Oral, resp. rate 16, weight 63.6 kg, SpO2 99 %. No results found. Recent Labs    12/26/20 0933  WBC 7.5  HGB 9.6*  HCT 29.0*  PLT 377   Recent Labs    12/26/20 0933  NA 134*  K 3.3*  CL 103  CO2 24  GLUCOSE 296*  BUN 17  CREATININE 0.89  CALCIUM 9.0    Intake/Output Summary (Last 24 hours) at 12/27/2020 0820 Last data filed at 12/27/2020 0500 Gross per 24 hour  Intake 377 ml  Output 1050 ml  Net -673 ml        Physical Exam: BP 108/72 (BP Location: Left Arm)   Pulse 87   Temp 98.3 F (36.8 C) (Oral)   Resp 16   Wt 63.6 kg   SpO2 99%   BMI 21.96 kg/m    General: No acute distress Mood and affect are appropriate Heart: Regular rate and rhythm no rubs murmurs or extra sounds Lungs: Clear to auscultation, breathing unlabored, no rales or wheezes Abdomen: Positive bowel sounds, soft nontender to palpation, nondistended Extremities: No clubbing, cyanosis, or edema Skin: No evidence of breakdown, no evidence of rash  GU: + Nephrostomy drain Skin: Warm and dry.  Intact. Psych: Flat.  Limited engagement. Musc: RUE/RLE: With mild edema, stable.  No tenderness in extremities. Neuro: Alert Continued global aphasia Motor: 0/5 RUE, 2-3 R Quad, and HF, 0 at ankle/foot, DTR's 3+ on right, 4-5 beats of clonus   Assessment/Plan: 1. Functional deficits which require 3+ hours per day of interdisciplinary therapy in a comprehensive inpatient rehab setting.  Physiatrist is providing close team supervision and 24 hour management of active medical problems listed below.  Physiatrist and rehab team continue  to assess barriers to discharge/monitor patient progress toward functional and medical goals   Care Tool:  Bathing  Bathing activity did not occur: Refused Body parts bathed by patient: Chest,Abdomen,Front perineal area,Right upper leg,Left upper leg,Left lower leg,Face,Right arm   Body parts bathed by helper: Left arm,Buttocks,Right lower leg     Bathing assist Assist Level: Minimal Assistance - Patient > 75%     Upper Body Dressing/Undressing Upper body dressing   What is the patient wearing?: Pull over shirt    Upper body assist Assist Level: Supervision/Verbal cueing    Lower Body Dressing/Undressing Lower body dressing      What is the patient wearing?: Incontinence brief,Pants     Lower body assist Assist for lower body dressing: Moderate Assistance - Patient 50 - 74%     Toileting Toileting    Toileting assist Assist for toileting: Moderate Assistance - Patient 50 - 74%     Transfers Chair/bed transfer  Transfers assist     Chair/bed transfer assist level: Contact Guard/Touching assist     Locomotion Ambulation   Ambulation assist   Ambulation activity did not occur: Safety/medical concerns  Assist level: Contact Guard/Touching assist Assistive device: Cane-quad Max distance: 55   Walk 10 feet activity   Assist  Walk 10 feet activity did not occur: Safety/medical concerns  Assist level: Contact Guard/Touching assist Assistive device: Cane-quad   Walk 50 feet activity   Assist Walk 50 feet with  2 turns activity did not occur: Safety/medical concerns  Assist level: Contact Guard/Touching assist Assistive device: Cane-quad    Walk 150 feet activity   Assist Walk 150 feet activity did not occur: Safety/medical concerns         Walk 10 feet on uneven surface  activity   Assist Walk 10 feet on uneven surfaces activity did not occur: Safety/medical concerns         Wheelchair     Assist Will patient use wheelchair at  discharge?: Yes Type of Wheelchair: Manual    Wheelchair assist level: Set up assist Max wheelchair distance: 150    Wheelchair 50 feet with 2 turns activity    Assist        Assist Level: Supervision/Verbal cueing   Wheelchair 150 feet activity     Assist      Assist Level: Supervision/Verbal cueing    Medical Problem List and Plan: 1.  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  Antithrombotics: -DVT/anticoagulation:  Mechanical: Sequential compression devices, below knee Bilateral lower extremities             -antiplatelet therapy: N/A 3. 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      4. Mood: LCSW to follow for evaluation and support.              -antipsychotic agents: N/A 5. Neuropsych: This patient is not capable of making decisions on her own behalf. 6. Skin/Wound Care: Routine pressure relief measures.  7. Fluids/Electrolytes/Nutrition: Monitor I/Os.  8. E.coli UTI: antibiotics narrowed to IV ceftriaxone, discussed with pharmacy changed to Keflex-completed .  9. Left emphysematous pyelitis s/p nephrostomy: Continue to monitor output dily.              --Radiology following for input.   Foley DC'd, voiding trial---mostly continent 10. Leucocytosis:  .               resolved on Keflex  Afebrile    11. Thrombocytopenia:   Resolved PLT 377K 12. Abnormal LFTs:              LFTs elevated on 5/21  Continue to monitor 13. Acute kidney injury: resolved, obstructive uropathy, s/p ureteral stent per IR   Creatinine 1.01 on 5/23 now nl at 0.89  Continue to monitor 14. HTN:  Hypotension has resolved.  Continue to hold norvasc.    Vitals:   12/26/20 1932 12/27/20 0452  BP: 134/83 108/72  Pulse: 90 87  Resp: 18 16  Temp: 98.5 F (36.9 C) 98.3 F (36.8 C)  SpO2: 99%   Controlled 5/31             Monitor his increase mobility 15. Adjustment disorder: Continue Seroquel bid. 16. Stress induced hyperglycemia: Hgb A1C-5.2 but BS labile             --Continue to monitor with ac/hs CBG checks.           -reasonable control of cbg's 17.  E lyte disurbance may be post opstructive   Sodium 133 on 5/23 stable at 134 K+ mildly  low   Continue to monitor 18.  Bowel incont- likely multifactorial communication a major issue- with mild abd discomfort will check KUB prior to d/c LOS: 11 days A FACE TO [place] E Jalicia Roszak 12/27/2020, 8:20 AM

## 2021-01-04 NOTE — Therapy (Signed)
[facility] [address] [place], [place], [address] Phone: [phone]   Fax:  [phone]  Patient Details  Name: Melanie Madden MRN: [id number] Date of Birth: 09-15-1965 Referring Provider:  Alysia Penna  Encounter Date: 01/04/2021   SPEECH THERAPY DISCHARGE SUMMARY  Visits from Start of Care: 12  Current functional level related to goals / functional outcomes: [name] did not return to [facility] as pt was admitted to hospital for septic shock. Pt made limited progress towards her personal goal of maximizing verbal output secondary to severity of apraxia of speech and aphasia. Pt noted with decreased interest in maximizing communication via external aids or Lingraphica. Pt was discharged from [facility] due to change in condition resulting in hospitalization.    Remaining deficits: Severe apraxia of speech and aphasia    Education / Equipment: Articulation exercises for apraxia, communication strategies, caregiver education   Plan: Patient agrees to discharge.  Patient goals were partially met. Patient is being discharged due to a change in medical status.  ?????        SLP Short Term Goals - 11/21/20 1454              SLP SHORT TERM GOAL #1    Title Given SLP modeling, pt will simultaneously produce 3 different phonemes for word approximations with 50% accuracy over three sessions     Baseline 10-28-20, 11-04-20, 11-09-20     Period --   or 9 total visits    Status Achieved          SLP SHORT TERM GOAL #2    Title Pt will ID personally relevant objects in f:4 for communication of wants/needs with 75% accuracy with min A over 3 sessions     Baseline 11-04-20, 11-07-20     Status Partially Met          SLP SHORT TERM GOAL #3    Title Pt will write word or word approximations to aid communication for 3/5 opportunities with min A over 3 sessions.     Baseline 11-07-20, 11-09-20, 11-17-20     Status  Achieved          SLP SHORT TERM GOAL #4    Title Pt will use mulitmodal communication (gesture, draw, write 1st letter etc) to augment verbal expression with usual min A over 3 sessions     Baseline 10-28-20 (alphabet board), 11-04-20 (draw), 11-07-20, 11-09-20     Status Achieved          SLP SHORT TERM GOAL #5    Title Pt will verbalize salient functional phrases with use of MIT with usual min A over 3 sessions     Status Not Met                  SLP Long Term Goals - 12/06/20 1314              SLP LONG TERM GOAL #1    Title Pt will accurately produce phonemes to facilitate word approximations for 3 personally relevant words with min A over 2 sessions     Time 2     Period Weeks   or 17 total visits    Status On-going          SLP LONG TERM GOAL #2    Title Pt will ID personally relevant objects in f:8 for communication of wants/needs with 75% accuracy given rare min A over 3 sessions     Baseline 12-06-20  Time 2     Period Weeks     Status On-going          SLP LONG TERM GOAL #3    Title Pt will use mulitmodal communication (gesture, draw, write 1st letter etc) to augment verbal expression to meet needs at home with rare min A from family.     Baseline 12-06-20     Time 2     Period Weeks     Status On-going          SLP LONG TERM GOAL #4    Title Pt will verbalize salient functional phrases x3 with use of MIT with occasional min A over 3 sessions     Time 2     Period Weeks     Status On-going         Alinda Deem, MA CCC-SLP 01/04/2021, 4:32 PM  [facility] [address] [place], [place], [address] Phone: [phone]   Fax:  [phone]

## 2021-01-08 ENCOUNTER — Emergency Department (HOSPITAL_COMMUNITY)
Admission: EM | Admit: 2021-01-08 | Discharge: 2021-01-08 | Disposition: A | Discharge status: Home / Self Care | Payer: Managed Care, Other (non HMO) | Attending: Emergency Medicine | Admitting: Emergency Medicine

## 2021-01-08 ENCOUNTER — Other Ambulatory Visit: Payer: Self-pay

## 2021-01-08 ENCOUNTER — Encounter (HOSPITAL_COMMUNITY): Payer: Self-pay | Admitting: Pharmacy Technician

## 2021-01-08 ENCOUNTER — Emergency Department (HOSPITAL_COMMUNITY): Payer: Managed Care, Other (non HMO)

## 2021-01-08 DIAGNOSIS — I10 Essential (primary) hypertension: Secondary | ICD-10-CM | POA: Insufficient documentation

## 2021-01-08 DIAGNOSIS — Z936 Other artificial openings of urinary tract status: Secondary | ICD-10-CM | POA: Diagnosis not present

## 2021-01-08 DIAGNOSIS — Z87891 Personal history of nicotine dependence: Secondary | ICD-10-CM | POA: Insufficient documentation

## 2021-01-08 DIAGNOSIS — N390 Urinary tract infection, site not specified: Secondary | ICD-10-CM | POA: Insufficient documentation

## 2021-01-08 DIAGNOSIS — R82998 Other abnormal findings in urine: Secondary | ICD-10-CM | POA: Diagnosis present

## 2021-01-08 DIAGNOSIS — R319 Hematuria, unspecified: Secondary | ICD-10-CM | POA: Diagnosis not present

## 2021-01-08 LAB — URINALYSIS, ROUTINE W REFLEX MICROSCOPIC
Bilirubin Urine: NEGATIVE
Bilirubin Urine: NEGATIVE
Glucose, UA: NEGATIVE mg/dL
Glucose, UA: NEGATIVE mg/dL
Ketones, ur: NEGATIVE mg/dL
Ketones, ur: NEGATIVE mg/dL
Nitrite: NEGATIVE
Nitrite: POSITIVE — AB
Protein, ur: 100 mg/dL — AB
Protein, ur: 100 mg/dL — AB
RBC / HPF: >50 RBC/hpf — ABNORMAL HIGH (ref 0–5)
RBC / HPF: >50 RBC/hpf — ABNORMAL HIGH (ref 0–5)
Specific Gravity, Urine: 1.016 (ref 1.005–1.030)
Specific Gravity, Urine: 1.018 (ref 1.005–1.030)
WBC, UA: >50 WBC/hpf — ABNORMAL HIGH (ref 0–5)
WBC, UA: >50 WBC/hpf — ABNORMAL HIGH (ref 0–5)
pH: 6 (ref 5.0–8.0)
pH: 6 (ref 5.0–8.0)

## 2021-01-08 LAB — BASIC METABOLIC PANEL
Anion gap: 7 (ref 5–15)
BUN: 22 mg/dL — ABNORMAL HIGH (ref 6–20)
CO2: 27 mmol/L (ref 22–32)
Calcium: 9.5 mg/dL (ref 8.9–10.3)
Chloride: 103 mmol/L (ref 98–111)
Creatinine, Ser: 0.93 mg/dL (ref 0.44–1.00)
GFR, Estimated: >60 mL/min (ref >60)
Glucose, Bld: 111 mg/dL — ABNORMAL HIGH (ref 70–99)
Potassium: 4 mmol/L (ref 3.5–5.1)
Sodium: 137 mmol/L (ref 135–145)

## 2021-01-08 LAB — CBC WITH DIFFERENTIAL/PLATELET
Abs Immature Granulocytes: 0.08 10*3/uL — ABNORMAL HIGH (ref 0.00–0.07)
Basophils Absolute: 0 10*3/uL (ref 0.0–0.1)
Basophils Relative: 0 %
Eosinophils Absolute: 0.1 10*3/uL (ref 0.0–0.5)
Eosinophils Relative: 2 %
HCT: 34.8 % — ABNORMAL LOW (ref 36.0–46.0)
Hemoglobin: 11 g/dL — ABNORMAL LOW (ref 12.0–15.0)
Immature Granulocytes: 1 %
Lymphocytes Relative: 31 %
Lymphs Abs: 1.8 10*3/uL (ref 0.7–4.0)
MCH: 28.6 pg (ref 26.0–34.0)
MCHC: 31.6 g/dL (ref 30.0–36.0)
MCV: 90.4 fL (ref 80.0–100.0)
Monocytes Absolute: 0.6 10*3/uL (ref 0.1–1.0)
Monocytes Relative: 10 %
Neutro Abs: 3.3 10*3/uL (ref 1.7–7.7)
Neutrophils Relative %: 56 %
Platelets: 256 10*3/uL (ref 150–400)
RBC: 3.85 MIL/uL — ABNORMAL LOW (ref 3.87–5.11)
RDW: 14.9 % (ref 11.5–15.5)
WBC: 5.9 10*3/uL (ref 4.0–10.5)
nRBC: 0 % (ref 0.0–0.2)

## 2021-01-08 LAB — LACTIC ACID, PLASMA: Lactic Acid, Venous: 1.2 mmol/L (ref 0.5–1.9)

## 2021-01-08 MED ORDER — CEPHALEXIN 500 MG PO CAPS: 500.0000 mg | ORAL_CAPSULE | Freq: Two times a day (BID) | ORAL | 0 refills | Status: AC

## 2021-01-08 NOTE — Discharge Instructions (Addendum)
Contact a health care provider if: You have problems with any of the valves or tubing. You have persistent pain or soreness in your back. You have redness, swelling, or pain around your tube insertion site. You have fluid or blood coming from your tube insertion site. Your tube insertion site feels warm to the touch. You have pus or a bad smell coming from your tube insertion site. You have increased urine output or you feel burning when urinating. Get help right away if: You have pain in your abdomen during the first week. You have chest pain or have trouble breathing. You have a new appearance of blood in your urine. You have a fever or chills. You have back pain that is not relieved by your medicine. You have decreased urine output. Your nephrostomy tube comes out.

## 2021-01-08 NOTE — ED Triage Notes (Signed)
Pt bib family with reports of decreased output from nephrostomy today. Pt nonverbal. Appears uncomfortable with palpation.

## 2021-01-08 NOTE — ED Notes (Signed)
Patient transported to CT 

## 2021-01-08 NOTE — ED Provider Notes (Signed)
[facility] EMERGENCY DEPARTMENT Provider Note   CSN: [id number] Arrival date & time: 01/08/21  4174     History Chief Complaint  Patient presents with   NEPHROSTOMY BAG    Melanie Madden is a 55 y.o. female with a past medical history of CVA with expressive aphasia and hemiparesis who presents with her husband for evaluation of her right sided nephrostomy tube.  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  She was treated with fluids, pressors broad-spectrum antibiotics and right nephrostomy tube placed by interventional radiology.  Blood cultures 2/4 were positive for E.. Coli which was pansensitive.Marland Kitchen  Her husband brings her in because today there was no output in her nephrostomy tube.  During her wait in the emergency department he noticed some cloudy urine.  Patient has not been complaining of pain and has not had any fevers or chills.    HPI     Past Medical History:  Diagnosis Date   Allergy    Anxiety    Arthritis    High triglycerides    Kidney stones    Migraines    Recurrent sinus infections     Patient Active Problem List   Diagnosis Date Noted   Urinary retention    E. 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     Family History  Problem Relation Age of Onset   Alzheimer's disease Mother    Hypertension Mother    Diabetes Mother    Thyroid disease Mother    Deep vein thrombosis Maternal Grandmother    Colon cancer Neg Hx    Colon polyps Neg Hx    Esophageal cancer Neg Hx    Pancreatic cancer Neg Hx    Rectal cancer Neg Hx    Stomach cancer Neg Hx    Breast cancer Neg Hx     Social History   Tobacco Use   Smoking status: Former    Pack years: 0.00   Smokeless tobacco: Never  Vaping Use   Vaping Use: Never used  Substance Use Topics   Alcohol use: Yes    Alcohol/week: 0.0 standard drinks  Comment: social    Drug use: No    Home Medications Prior to Admission medications   Medication Sig Start Date End Date Taking? Authorizing Provider  atorvastatin (LIPITOR) 20 MG tablet Take 1 tablet (20 mg total) by mouth daily at 6 PM. 08/20/19  Yes Angiulli, Lavon Paganini, PA-C  B Complex-C (B-COMPLEX WITH VITAMIN C) tablet Take 1 tablet by mouth daily.   Yes [provider]  CALCIUM PO Take 1 tablet by mouth daily.   Yes [provider]  cephALEXin (KEFLEX) 500 MG capsule Take 1 capsule (500 mg total) by mouth 2 (two) times daily for 7 days. 01/08/21 01/15/21 Yes Cortnee Steinmiller, PA-C  cholecalciferol (VITAMIN D) 1000 units tablet Take 1,000 Units by mouth daily. 2,000 units daily   Yes [provider]  docusate sodium (COLACE) 100 MG capsule Take 2 capsules (200 mg total) by mouth daily. 12/27/20  Yes Love, Ivan Anchors, PA-C  magnesium 30 MG tablet Take 30 mg by mouth daily.   Yes [provider]  Omega-3 Fatty Acids (FISH OIL) 1200 MG CPDR Take 1 capsule by mouth daily.   Yes [provider]  QUEtiapine (SEROQUEL) 50 MG tablet Take 1 tablet (50 mg total) by mouth 2 (two) times daily. 11/10/20  Yes Kirsteins, [name], MD  sertraline (ZOLOFT) 50 MG tablet Take 1 tablet (50 mg total) by mouth daily. 11/10/20  Yes Kirsteins, [name], MD    Allergies    Amoxicillin and Shellfish allergy  Review of Systems   Review of Systems Ten systems reviewed and are negative for acute change, except as noted in the HPI.   Physical Exam Updated Vital Signs BP 132/81 (BP Location: Right Arm)   Pulse 88   Temp 98.7 F (37.1 C) (Oral)   Resp (!) 21   SpO2 96%   Physical Exam Vitals and nursing note reviewed.  Constitutional:      General: She is not in acute distress.    Appearance: She is well-developed. She is not diaphoretic.  HENT:     Head: Normocephalic and atraumatic.     Right Ear: External ear normal.     Left Ear: External ear normal.     Nose: Nose normal.     Mouth/Throat:     Mouth: Mucous membranes are moist.  Eyes:     General: No scleral icterus.    Conjunctiva/sclera: Conjunctivae normal.  Cardiovascular:     Rate and Rhythm: Normal rate and regular rhythm.     Heart sounds: Normal heart sounds. No murmur heard.   No friction rub. No gallop.  Pulmonary:     Effort: Pulmonary effort is normal. No respiratory distress.     Breath sounds: Normal breath sounds.  Abdominal:     General: Bowel sounds are normal. There is no distension.     Palpations: Abdomen is soft. There is no mass.     Tenderness: There is no abdominal tenderness. There is no guarding.     Comments: Nephrostomy tube in the right flank region without evidence of infection.  Urine is cloudy in the bag.  Musculoskeletal:     Cervical back: Normal range of motion.  Skin:    General: Skin is warm and  dry.  Neurological:     Mental Status: She is alert and oriented to person, place, and time.  Psychiatric:        Behavior: Behavior normal.    ED Results / Procedures / Treatments   Labs (  all labs ordered are listed, but only abnormal results are displayed) Labs Reviewed  CBC WITH DIFFERENTIAL/PLATELET - Abnormal; Notable for the following components:      Result Value   RBC 3.85 (*)    Hemoglobin 11.0 (*)    HCT 34.8 (*)    Abs Immature Granulocytes 0.08 (*)    All other components within normal limits  BASIC METABOLIC PANEL - Abnormal; Notable for the following components:   Glucose, Bld 111 (*)    BUN 22 (*)    All other components within normal limits  URINALYSIS, ROUTINE W REFLEX MICROSCOPIC - Abnormal; Notable for the following components:   Color, Urine AMBER (*)    APPearance TURBID (*)    Hgb urine dipstick MODERATE (*)    Protein, ur 100 (*)    Leukocytes,Ua LARGE (*)    RBC / HPF >50 (*)    WBC, UA >50 (*)    Bacteria, UA MANY (*)    All other components within normal limits  URINALYSIS, ROUTINE W REFLEX MICROSCOPIC - Abnormal; Notable for the following components:   Color, Urine AMBER (*)    APPearance CLOUDY (*)    Hgb urine dipstick LARGE (*)    Protein, ur 100 (*)    Nitrite POSITIVE (*)    Leukocytes,Ua LARGE (*)    RBC / HPF >50 (*)    WBC, UA >50 (*)    Bacteria, UA FEW (*)    All other components within normal limits  LACTIC ACID, PLASMA    EKG None  Radiology CT Renal Stone Study  Result Date: 01/08/2021 CLINICAL DATA:  Nephrostomy output changes EXAM: CT ABDOMEN AND PELVIS WITHOUT CONTRAST TECHNIQUE: Multidetector CT imaging of the abdomen and pelvis was performed following the standard protocol without IV contrast. COMPARISON:  Dec 09, 2020 FINDINGS: Evaluation is limited secondary to lack of IV contrast. Lower chest: No acute abnormality. Hepatobiliary: Unremarkable noncontrast appearance. Gallbladder is unremarkable. Pancreas: No  peripancreatic fat stranding. Spleen: Punctate calcifications the spleen consistent with sequela of remote prior granulomatous infection. Adrenals/Urinary Tract: Unchanged 3.8 cm LEFT adrenal adenoma. RIGHT adrenal gland is unremarkable. Multiple nonobstructive nephrolithiasis within the LEFT kidney. RIGHT-sided nephrostomy tube is coiled within the RIGHT renal pelvis. It is difficult to ascertain whether part of the pigtail extends outside of the renal pelvis due to artifact. No RIGHT-sided hydronephrosis. No adjacent fluid collection. Multiple nonobstructive RIGHT-sided nephrolithiasis. Incompletely assessed hypodense RIGHT renal lesion, similar in comparison to prior. Bladder is decompressed. Stomach/Bowel: No evidence of bowel obstruction. Appendix is normal. Moderate colonic stool burden diffusely throughout the colon. Vascular/Lymphatic: Atherosclerotic calcifications. No new suspicious lymphadenopathy. Reproductive: Uterus and bilateral adnexa are unremarkable. Other: No free air or free fluid. Musculoskeletal: Degenerative changes of the thoracolumbar spine. IMPRESSION: 1. RIGHT-sided nephrostomy tube is coiled within the RIGHT renal pelvis. No hydronephrosis. If persistent concern for nephrostomy tube malpositioning, consider dedicated contrast enhanced CT with delayed imaging versus nephrostomy tube check. Aortic Atherosclerosis (ICD10-I70.0). Electronically Signed   By: Valentino Saxon MD   On: 01/08/2021 14:47    Procedures Procedures   Medications Ordered in ED Medications - No data to display  ED Course  I have reviewed the triage vital signs and the nursing notes.  Pertinent labs & imaging results that were available during my care of the patient were reviewed by me and considered in my medical decision making (see chart for details).    MDM Rules/Calculators/A&P  Patient here with abnormal urine output in her nephrostomy tube.  I ordered and reviewed labs  that include CBC without elevated white blood cell count, BMP with mildly elevated blood glucose, lactic acid within normal limits. Initial urine collected from the nephrostomy bag appeared to be infected.  I removed the nephrostomy bag from the nephrostomy tube, cleaned the port and took a sample from this area.  This also appears infected.  I I discussed the case with urology on-call who recommends that I use a sterile 10 cc flush to flush the nephrostomy tube to make sure that it is working appropriately.  This was done and it is working appropriately.  She recommends treatment with empiric antibiotics based on her previous cultures.  I discussed this with Lucio Edward, ED pharmacist and reviewed cultures he recommends Keflex.  The patient CT scan shows no acute abnormalities or dislodgment of the nephrostomy tube.  The patient appears otherwise appropriate for discharge at this time on Keflex.  She is hemodynamically stable without evidence of systemic infection. Final Clinical Impression(s) / ED Diagnoses Final diagnoses:  Urinary tract infection with hematuria, site unspecified    Rx / DC Orders ED Discharge Orders          Ordered    cephALEXin (KEFLEX) 500 MG capsule  2 times daily        01/08/21 1527             Margarita Mail, PA-C 01/08/21 1706    Pattricia Boss, MD 01/11/21 1039

## 2021-01-08 NOTE — ED Provider Notes (Signed)
Emergency Medicine Provider Triage Evaluation Note  Melanie Madden , a 55 y.o. female  was evaluated in triage.  Pt complains of changes to nephrostomy output this morning. History of large stroke 2020 and non verbal, right sided weakness.  History of ureteral stone, sepsis.  History provided by husband. No fevers.  Review of Systems  Positive: Non verbal Negative: fever  Physical Exam  BP 124/87 (BP Location: Left Arm)   Pulse 86   Temp 98.2 F (36.8 C) (Oral)   Resp 16   SpO2 97%  Gen:   Awake, no distress   Resp:  Normal effort  MSK:   Right sided weakness   Abdr:  Grimace with right CVA percussion and right sided abd palpation Skin:   Scant yellow drainage at skin at nephrostomy   Medical Decision Making  Medically screening exam initiated at 9:03 AM.  Appropriate orders placed.     Patient made aware this encounter is a triage and screening encounter and no beds are immediately available at this time in the ER.  Patient was informed that the remainder of the evaluation will be completed by another provider.  Patient made aware triage orders have been placed and patient will be placed in the waiting room while work up is initiated and until a room becomes available. Patient encouraged to await a formal ER encounter with a clinician.  Patient made aware that exiting the department prior to formal encounter with an ER clinician and completion of the work-up is considered leaving against medical advice.  At that time there is no guarantee that there are no emergency medical conditions present and patient assumes risks of leaving including worsening condition, permanent disability and death. Patient verbalizes understanding.     Kinnie Feil, PA-C 01/08/21 2023    Pattricia Boss, MD 01/11/21 1038

## 2021-01-09 ENCOUNTER — Other Ambulatory Visit: Payer: Self-pay | Admitting: Physical Medicine & Rehabilitation

## 2021-01-16 ENCOUNTER — Other Ambulatory Visit: Payer: Self-pay | Admitting: Urology

## 2021-01-19 NOTE — Progress Notes (Addendum)
COVID Vaccine Completed: No Date COVID Vaccine completed: Has received booster: COVID vaccine manufacturer: [place]   Date of COVID positive in last 90 days: N/A  PCP - Rachell Cipro, MD Cardiologist - N/A  Chest x-ray - 12-09-20 Epic EKG - 12-09-20 Epic Stress Test - N/A ECHO - 07-10-19 Epic Cardiac Cath - N/A Pacemaker/ICD device last checked: Spinal Cord Stimulator:  Sleep Study - N/A CPAP -   Fasting Blood Sugar - N/A Checks Blood Sugar _____ times a day  Blood Thinner Instructions: N/A Aspirin Instructions: Last Dose:  Activity level:  Unable to go up a flight of stairs due to right-sided hemiparesis from stroke.    Patient has expressive aphasia from a stroke but denies difficulty swallowing.     Anesthesia review:  N/A  Patient denies shortness of breath, fever, cough and chest pain at PAT appointment   Patient verbalized understanding of instructions that were given to them at the PAT appointment. Patient was also instructed that they will need to review over the PAT instructions again at home before surgery.

## 2021-01-19 NOTE — Patient Instructions (Signed)
DUE TO COVID-19 ONLY ONE VISITOR IS ALLOWED TO COME WITH YOU AND STAY IN THE WAITING ROOM ONLY DURING PRE OP AND PROCEDURE.   **NO VISITORS ARE ALLOWED IN THE SHORT STAY AREA OR RECOVERY ROOM!!**         Your procedure is scheduled on:  Wednesday, 02-08-21   Report to [facility] Main  Entrance   Report to admitting at 11:20 AM   Call this number if you have problems the morning of surgery [phone]   Do not eat food :After Midnight.   May have liquids until 10:15 AM  day of surgery  CLEAR LIQUID DIET  Foods Allowed                                                                     Foods Excluded  Water, Black Coffee and tea, regular and decaf             liquids that you cannot  Plain Jell-O in any flavor  (No red)                                   see through such as: Fruit ices (not with fruit pulp)                                      milk, soups, orange juice              Iced Popsicles (No red)                                      All solid food                                   Apple juices Sports drinks like Gatorade (No red) Lightly seasoned clear broth or consume(fat free) Sugar, honey syrup     Oral Hygiene is also important to reduce your risk of infection.                                    Remember - BRUSH YOUR TEETH THE MORNING OF SURGERY WITH YOUR REGULAR TOOTHPASTE   Do NOT smoke after Midnight   Take these medicines the morning of surgery with A SIP OF WATER:  Seroquel, Sertraline                          You may not have any metal on your body including hair pins, jewelry, and body piercing             Do not wear make-up, lotions, powders, perfumes or deodorant  Do not wear nail polish including gel and S&S, artificial/acrylic nails, or any other type of covering on natural nails including finger and toenails. If you have artificial nails, gel coating, etc. that needs to be removed by a  nail salon please have this removed prior to surgery or  surgery may need to be canceled/ delayed if the surgeon/ anesthesia feels like they are unable to be safely monitored.   Do not shave  48 hours prior to surgery.              Do not bring valuables to the hospital. [place].   Contacts, dentures or bridgework may not be worn into surgery.    Patients discharged the day of surgery will not be allowed to drive home.  Special Instructions: Bring a copy of your healthcare power of attorney and living will documents         the day of surgery if you haven't scanned them in before.  Please read over the following fact sheets you were given: IF YOU HAVE QUESTIONS ABOUT YOUR PRE OP INSTRUCTIONS PLEASE CALL [phone]   [place] - Preparing for Surgery Before surgery, you can play an important role.  Because skin is not sterile, your skin needs to be as free of germs as possible.  You can reduce the number of germs on your skin by washing with CHG (chlorahexidine gluconate) soap before surgery.  CHG is an antiseptic cleaner which kills germs and bonds with the skin to continue killing germs even after washing. Please DO NOT use if you have an allergy to CHG or antibacterial soaps.  If your skin becomes reddened/irritated stop using the CHG and inform your nurse when you arrive at Short Stay. Do not shave (including legs and underarms) for at least 48 hours prior to the first CHG shower.  You may shave your face/neck.  Please follow these instructions carefully:  1.  Shower with CHG Soap the night before surgery and the  morning of surgery.  2.  If you choose to wash your hair, wash your hair first as usual with your normal  shampoo.  3.  After you shampoo, rinse your hair and body thoroughly to remove the shampoo.                             4.  Use CHG as you would any other liquid soap.  You can apply chg directly to the skin and wash.  Gently with a scrungie or clean washcloth.  5.  Apply the CHG Soap to  your body ONLY FROM THE NECK DOWN.   Do   not use on face/ open                           Wound or open sores. Avoid contact with eyes, ears mouth and   genitals (private parts).                       Wash face,  Genitals (private parts) with your normal soap.             6.  Wash thoroughly, paying special attention to the area where your    surgery  will be performed.  7.  Thoroughly rinse your body with warm water from the neck down.  8.  DO NOT shower/wash with your normal soap after using and rinsing off the CHG Soap.                9.  Pat yourself dry with a clean towel.  10.  Wear clean pajamas.            11.  Place clean sheets on your bed the night of your first shower and do not  sleep with pets. Day of Surgery : Do not apply any lotions/deodorants the morning of surgery.  Please wear clean clothes to the hospital/surgery center.  FAILURE TO FOLLOW THESE INSTRUCTIONS MAY RESULT IN THE CANCELLATION OF YOUR SURGERY  PATIENT SIGNATURE_________________________________  NURSE SIGNATURE__________________________________  ________________________________________________________________________

## 2021-01-26 ENCOUNTER — Other Ambulatory Visit: Payer: Self-pay

## 2021-01-26 ENCOUNTER — Encounter: Payer: Self-pay | Admitting: Physical Medicine & Rehabilitation

## 2021-01-26 ENCOUNTER — Encounter
Payer: Managed Care, Other (non HMO) | Attending: Physical Medicine & Rehabilitation | Admitting: Physical Medicine & Rehabilitation

## 2021-01-26 VITALS — Temp 99.0°F | Ht 67.0 in | Wt 139.8 lb

## 2021-01-26 DIAGNOSIS — I69319 Unspecified symptoms and signs involving cognitive functions following cerebral infarction: Secondary | ICD-10-CM

## 2021-01-26 DIAGNOSIS — I69398 Other sequelae of cerebral infarction: Secondary | ICD-10-CM | POA: Diagnosis not present

## 2021-01-26 DIAGNOSIS — R269 Unspecified abnormalities of gait and mobility: Secondary | ICD-10-CM | POA: Insufficient documentation

## 2021-01-26 DIAGNOSIS — I610 Nontraumatic intracerebral hemorrhage in hemisphere, subcortical: Secondary | ICD-10-CM | POA: Diagnosis present

## 2021-01-26 DIAGNOSIS — R4701 Aphasia: Secondary | ICD-10-CM | POA: Diagnosis not present

## 2021-01-26 NOTE — Progress Notes (Signed)
Subjective:    Patient ID: Melanie Madden, female    DOB: 1965-09-23, 55 y.o.   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 More recently the patient has had ureteral obstruction with pyelonephritis and resulted in hospitalization followed by a short inpatient rehab stay from 12/16/2020 to 12/27/2020.  The patient discharged to her home where she stays with a hired caregiver.  The patient is able to dress her upper and lower body and she needs some assistance with bathing. She ambulates with a right AFO as well as a quad cane.  She does not have any home health therapy.  She does not wish to go to outpatient therapy, shakes her head at this suggestion.  Pain Inventory Average Pain 0 Pain Right Now 0 My pain is  no pain  LOCATION OF PAIN  no pain   BOWEL Number of stools per week: 7 Oral laxative use No  Type of laxative na Enema or suppository use No  History of colostomy No  Incontinent No   BLADDER Suprapubic In and out cath, frequency na Able to self cath  na Bladder incontinence No  Frequent urination No  Leakage with coughing No  Difficulty starting stream No  Incomplete bladder emptying  na   Mobility walk with assistance use a cane how many minutes can you walk? 5 ability to climb steps?  no do you drive?  no  Function disabled: date disabled . I need assistance with the following:  household duties  Neuro/Psych weakness trouble walking  Prior Studies x-rays CT/MRI  Physicians involved in your care Dr. Cecil Cobbs visit   Family History  Problem Relation Age of Onset   Alzheimer's disease Mother    Hypertension Mother    Diabetes Mother    Thyroid disease Mother    Deep vein thrombosis Maternal Grandmother    Colon cancer Neg Hx    Colon polyps Neg Hx    Esophageal cancer Neg Hx    Pancreatic cancer Neg Hx     Rectal cancer Neg Hx    Stomach cancer Neg Hx    Breast cancer Neg Hx    Social History   Socioeconomic History   Marital status: Significant Other    Spouse name: Not on file   Number of children: Not on file   Years of education: Not on file   Highest education level: Not on file  Occupational History   Not on file  Tobacco Use   Smoking status: Former    Pack years: 0.00   Smokeless tobacco: Never  Vaping Use   Vaping Use: Never used  Substance and Sexual Activity   Alcohol use: Yes    Alcohol/week: 0.0 standard drinks    Comment: social    Drug use: No   Sexual activity: Yes    Partners: Male  Other Topics Concern   Not on file  Social History Narrative   Not on file   Social Determinants of Health   Financial Resource Strain: Not on file  Food Insecurity: Not on file  Transportation Needs: Not on file  Physical Activity: Not on file  Stress: Not on file  Social Connections: Not on file   Past Surgical History:  Procedure Laterality Date   ANTERIOR CRUCIATE LIGAMENT REPAIR Left    BUNIONECTOMY Right    IR NEPHROSTOMY PLACEMENT RIGHT  12/09/2020   LASIK  LITHOTRIPSY     MENISCUS REPAIR Left    NASAL SEPTUM SURGERY     SHOULDER SURGERY     VARICOSE VEIN SURGERY Left    Past Medical History:  Diagnosis Date   Allergy    Anxiety    Arthritis    High triglycerides    Kidney stones    Migraines    Recurrent sinus infections    Temp 99 F (37.2 C) (Oral)   Ht 5\' 7"  (1.702 m)   Wt 139 lb 12.8 oz (63.4 kg)   BMI 21.90 kg/m   Opioid Risk Score:   Fall Risk Score:  `1  Depression screen PHQ 2/9  Depression screen [facility] 2/9 11/10/2020 05/06/2020 02/05/2020 09/25/2019  Decreased Interest 0 0 3 0  Down, Depressed, Hopeless 0 0 3 1  PHQ - 2 Score 0 0 6 1  Altered sleeping - - - 0  Tired, decreased energy - - - 0  Change in appetite - - - 0  Feeling bad or failure about yourself  - - - 0  Trouble concentrating - - - 0  Moving slowly or  fidgety/restless - - - 1  Suicidal thoughts - - - 0  PHQ-9 Score - - - 2  Difficult doing work/chores - - - Somewhat difficult  Some recent data might be hidden    Review of Systems  Musculoskeletal:  Positive for gait problem.  Neurological:  Positive for weakness.  All other systems reviewed and are negative.     Objective:   Physical Exam Vitals and nursing note reviewed.  Constitutional:      Appearance: She is ill-appearing.  HENT:     Head: Normocephalic and atraumatic.  Eyes:     Extraocular Movements: Extraocular movements intact.     Conjunctiva/sclera: Conjunctivae normal.     Pupils: Pupils are equal, round, and reactive to light.  Abdominal:     General: Abdomen is flat. Bowel sounds are normal.     Palpations: Abdomen is soft.  Neurological:     Mental Status: She is alert.     Gait: Gait abnormal.     Comments: 0/5 right upper extremity with exception of trace right shoulder shrug Tone with nonsustained clonus at the finger and wrist flexors on the right side. Right lower extremity 3 - hip flexor 4 - knee extensor, dorsiflexor not tested secondary to AFO Global aphasia cannot produce any words, will grunt Does follow simple commands Ambulates with a quad cane and right AFO  Psychiatric:        Attention and Perception: Attention normal.        Mood and Affect: Mood is depressed. Affect is angry.        Speech: She is noncommunicative.          Assessment & Plan:  1.  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  Her deficits have not changed with recent hospitalization.  She had a mild debility related to her pyelonephritis and ureteral obstruction.  She is following up with urology next week and hopes to have her ureterostomy removed. From a rehab standpoint her deficits are chronic and have not changed recently.  I would like to see her back in 3 months I have encouraged her to follow-up with her primary  care physician.  Also to follow-up with neuropsychology

## 2021-01-26 NOTE — Patient Instructions (Signed)
Make sure that you get up and walk as soon as possible after surgery to avoid getting weaker, please call if you need referral for outpt therapy post op

## 2021-01-31 ENCOUNTER — Encounter (HOSPITAL_COMMUNITY): Payer: Self-pay

## 2021-01-31 ENCOUNTER — Other Ambulatory Visit: Payer: Self-pay

## 2021-01-31 ENCOUNTER — Encounter (HOSPITAL_COMMUNITY)
Admission: RE | Admit: 2021-01-31 | Discharge: 2021-01-31 | Disposition: A | Discharge status: Home / Self Care | Payer: Managed Care, Other (non HMO) | Source: Ambulatory Visit | Attending: Urology | Admitting: Urology

## 2021-01-31 DIAGNOSIS — Z01812 Encounter for preprocedural laboratory examination: Secondary | ICD-10-CM | POA: Diagnosis not present

## 2021-01-31 HISTORY — DX: Aphasia: R47.01

## 2021-01-31 HISTORY — DX: Personal history of urinary calculi: Z87.442

## 2021-01-31 HISTORY — DX: Hemiplegia, unspecified affecting unspecified side: G81.90

## 2021-01-31 HISTORY — DX: Cerebral infarction, unspecified: I63.9

## 2021-01-31 LAB — BASIC METABOLIC PANEL
Anion gap: 8 (ref 5–15)
BUN: 23 mg/dL — ABNORMAL HIGH (ref 6–20)
CO2: 23 mmol/L (ref 22–32)
Calcium: 10 mg/dL (ref 8.9–10.3)
Chloride: 105 mmol/L (ref 98–111)
Creatinine, Ser: 0.87 mg/dL (ref 0.44–1.00)
GFR, Estimated: >60 mL/min (ref >60)
Glucose, Bld: 113 mg/dL — ABNORMAL HIGH (ref 70–99)
Potassium: 3.9 mmol/L (ref 3.5–5.1)
Sodium: 136 mmol/L (ref 135–145)

## 2021-01-31 LAB — CBC
HCT: 37.8 % (ref 36.0–46.0)
Hemoglobin: 12.1 g/dL (ref 12.0–15.0)
MCH: 29.1 pg (ref 26.0–34.0)
MCHC: 32 g/dL (ref 30.0–36.0)
MCV: 90.9 fL (ref 80.0–100.0)
Platelets: 218 10*3/uL (ref 150–400)
RBC: 4.16 MIL/uL (ref 3.87–5.11)
RDW: 15.7 % — ABNORMAL HIGH (ref 11.5–15.5)
WBC: 5.3 10*3/uL (ref 4.0–10.5)
nRBC: 0 % (ref 0.0–0.2)

## 2021-02-07 MED ORDER — GENTAMICIN SULFATE 40 MG/ML IJ SOLN
5.0000 mg/kg | INTRAVENOUS | Status: AC
  Administered 2021-02-08: 320 mg via INTRAVENOUS
  Filled 2021-02-07 (×2): qty 8

## 2021-02-08 ENCOUNTER — Encounter (HOSPITAL_COMMUNITY)
Admission: RE | Disposition: A | Discharge status: Home / Self Care | Payer: Self-pay | Source: Home / Self Care | Attending: Urology

## 2021-02-08 ENCOUNTER — Ambulatory Visit (HOSPITAL_COMMUNITY)
Admission: RE | Admit: 2021-02-08 | Discharge: 2021-02-08 | Disposition: A | Discharge status: Home / Self Care | Payer: Managed Care, Other (non HMO) | Attending: Urology | Admitting: Urology

## 2021-02-08 ENCOUNTER — Ambulatory Visit (HOSPITAL_COMMUNITY): Payer: Managed Care, Other (non HMO)

## 2021-02-08 ENCOUNTER — Ambulatory Visit: Payer: Managed Care, Other (non HMO) | Admitting: Psychology

## 2021-02-08 ENCOUNTER — Ambulatory Visit (HOSPITAL_COMMUNITY): Payer: Managed Care, Other (non HMO) | Admitting: Certified Registered"

## 2021-02-08 ENCOUNTER — Encounter (HOSPITAL_COMMUNITY): Payer: Self-pay | Admitting: Urology

## 2021-02-08 DIAGNOSIS — Z880 Allergy status to penicillin: Secondary | ICD-10-CM | POA: Insufficient documentation

## 2021-02-08 DIAGNOSIS — Z87891 Personal history of nicotine dependence: Secondary | ICD-10-CM | POA: Diagnosis not present

## 2021-02-08 DIAGNOSIS — I69351 Hemiplegia and hemiparesis following cerebral infarction affecting right dominant side: Secondary | ICD-10-CM | POA: Diagnosis not present

## 2021-02-08 DIAGNOSIS — A419 Sepsis, unspecified organism: Secondary | ICD-10-CM

## 2021-02-08 DIAGNOSIS — N132 Hydronephrosis with renal and ureteral calculous obstruction: Secondary | ICD-10-CM | POA: Insufficient documentation

## 2021-02-08 HISTORY — DX: Sepsis, unspecified organism: A41.9

## 2021-02-08 HISTORY — PX: CYSTOSCOPY/URETEROSCOPY/HOLMIUM LASER/STENT PLACEMENT: SHX6546

## 2021-02-08 SURGERY — CYSTOSCOPY/URETEROSCOPY/HOLMIUM LASER/STENT PLACEMENT
Anesthesia: General | Laterality: Right

## 2021-02-08 MED ORDER — OXYCODONE HCL 5 MG PO TABS: 5.0000 mg | ORAL_TABLET | Freq: Once | ORAL | Status: DC | PRN

## 2021-02-08 MED ORDER — ORAL CARE MOUTH RINSE: 15.0000 mL | Freq: Once | OROMUCOSAL | Status: DC

## 2021-02-08 MED ORDER — ONDANSETRON HCL 4 MG/2ML IJ SOLN
INTRAMUSCULAR | Status: DC | PRN
  Administered 2021-02-08: 4 mg via INTRAVENOUS

## 2021-02-08 MED ORDER — FENTANYL CITRATE (PF) 250 MCG/5ML IJ SOLN
INTRAMUSCULAR | Status: DC | PRN
  Administered 2021-02-08 (×4): 25 ug via INTRAVENOUS

## 2021-02-08 MED ORDER — LIDOCAINE 2% (20 MG/ML) 5 ML SYRINGE
INTRAMUSCULAR | Status: DC | PRN
  Administered 2021-02-08: 60 mg via INTRAVENOUS

## 2021-02-08 MED ORDER — PHENYLEPHRINE HCL-NACL 10-0.9 MG/250ML-% IV SOLN
INTRAVENOUS | Status: DC | PRN
  Administered 2021-02-08: 40 ug/min via INTRAVENOUS

## 2021-02-08 MED ORDER — FENTANYL CITRATE (PF) 100 MCG/2ML IJ SOLN
INTRAMUSCULAR | Status: AC
  Filled 2021-02-08: qty 2

## 2021-02-08 MED ORDER — IOHEXOL 300 MG/ML  SOLN
INTRAMUSCULAR | Status: DC | PRN
  Administered 2021-02-08: 10 mL via URETHRAL

## 2021-02-08 MED ORDER — MIDAZOLAM HCL 2 MG/2ML IJ SOLN
INTRAMUSCULAR | Status: AC
  Filled 2021-02-08: qty 2

## 2021-02-08 MED ORDER — SODIUM CHLORIDE 0.9 % IR SOLN
Status: DC | PRN
  Administered 2021-02-08: 3000 mL via INTRAVESICAL

## 2021-02-08 MED ORDER — HYDROMORPHONE HCL 1 MG/ML IJ SOLN: 0.2500 mg | INTRAMUSCULAR | Status: DC | PRN

## 2021-02-08 MED ORDER — DEXAMETHASONE SODIUM PHOSPHATE 10 MG/ML IJ SOLN
INTRAMUSCULAR | Status: DC | PRN
  Administered 2021-02-08: 10 mg via INTRAVENOUS

## 2021-02-08 MED ORDER — ONDANSETRON HCL 4 MG/2ML IJ SOLN: 4.0000 mg | Freq: Once | INTRAMUSCULAR | Status: DC | PRN

## 2021-02-08 MED ORDER — LACTATED RINGERS IV SOLN: INTRAVENOUS | Status: DC

## 2021-02-08 MED ORDER — OXYCODONE HCL 5 MG/5ML PO SOLN: 5.0000 mg | Freq: Once | ORAL | Status: DC | PRN

## 2021-02-08 MED ORDER — PROPOFOL 10 MG/ML IV BOLUS
INTRAVENOUS | Status: DC | PRN
  Administered 2021-02-08: 130 mg via INTRAVENOUS

## 2021-02-08 MED ORDER — HYDROCODONE-ACETAMINOPHEN 5-325 MG PO TABS: 1.0000 | ORAL_TABLET | ORAL | 0 refills | Status: DC | PRN

## 2021-02-08 MED ORDER — MIDAZOLAM HCL 5 MG/5ML IJ SOLN
INTRAMUSCULAR | Status: DC | PRN
  Administered 2021-02-08: 2 mg via INTRAVENOUS

## 2021-02-08 MED ORDER — ACETAMINOPHEN 10 MG/ML IV SOLN: 1000.0000 mg | Freq: Once | INTRAVENOUS | Status: DC | PRN

## 2021-02-08 MED ORDER — PROPOFOL 10 MG/ML IV BOLUS
INTRAVENOUS | Status: AC
  Filled 2021-02-08: qty 20

## 2021-02-08 MED ORDER — AMISULPRIDE (ANTIEMETIC) 5 MG/2ML IV SOLN: 10.0000 mg | Freq: Once | INTRAVENOUS | Status: DC | PRN

## 2021-02-08 MED ORDER — CHLORHEXIDINE GLUCONATE 0.12 % MT SOLN: 15.0000 mL | Freq: Once | OROMUCOSAL | Status: DC

## 2021-02-08 MED ORDER — CIPROFLOXACIN HCL 500 MG PO TABS: 500.0000 mg | ORAL_TABLET | Freq: Two times a day (BID) | ORAL | 0 refills | Status: AC

## 2021-02-08 SURGICAL SUPPLY — 20 items
BAG URO CATCHER STRL LF (MISCELLANEOUS) ×2 IMPLANT
BASKET ZERO TIP NITINOL 2.4FR (BASKET) IMPLANT
CATH URET 5FR 28IN OPEN ENDED (CATHETERS) ×2 IMPLANT
CLOTH BEACON ORANGE TIMEOUT ST (SAFETY) ×2 IMPLANT
EXTRACTOR STONE NITINOL NGAGE (UROLOGICAL SUPPLIES) ×2 IMPLANT
GLOVE SURG ENC TEXT LTX SZ7.5 (GLOVE) ×6 IMPLANT
GOWN STRL REUS W/TWL XL LVL3 (GOWN DISPOSABLE) ×4 IMPLANT
GUIDEWIRE STR DUAL SENSOR (WIRE) ×2 IMPLANT
GUIDEWIRE ZIPWRE .038 STRAIGHT (WIRE) ×2 IMPLANT
KIT TURNOVER KIT A (KITS) ×2 IMPLANT
LASER FIB FLEXIVA PULSE ID 365 (Laser) IMPLANT
MANIFOLD NEPTUNE II (INSTRUMENTS) ×2 IMPLANT
PACK CYSTO (CUSTOM PROCEDURE TRAY) ×2 IMPLANT
SHEATH URETERAL 12FRX35CM (MISCELLANEOUS) ×2 IMPLANT
STENT URET 6FRX24 CONTOUR (STENTS) ×2 IMPLANT
STENT URET 6FRX26 CONTOUR (STENTS) IMPLANT
TRACTIP FLEXIVA PULS ID 200XHI (Laser) ×2 IMPLANT
TRACTIP FLEXIVA PULSE ID 200 (Laser) ×2
TUBING CONNECTING 10 (TUBING) ×2 IMPLANT
TUBING UROLOGY SET (TUBING) ×2 IMPLANT

## 2021-02-08 NOTE — Op Note (Signed)
Operative Note  Preoperative diagnosis:  1.  Right renal and ureteral calculi  Postoperative diagnosis: 1.  Same  Procedure(s): 1.  Cystoscopy with right ureteroscopy, holmium laser lithotripsy and right JJ stent placement 2.  Right nephrostomy tube removal 3.  Right retrograde pyelogram with intraoperative interpretation of fluoroscopic imaging  Surgeon: Ellison Hughs, MD  Assistants:  None  Anesthesia:  General  Complications:  None  EBL: Less than 5 mL  Specimens: 1.  Kidney stone fragments 2.  Previously placed right nephrostomy tube was removed intact, inspected and discarded  Drains/Catheters: 1.  Right 6 French, 24 cm JJ stent without tether  Intraoperative findings:   8 mm right UPJ calculus 11 mm right renal calculus  Indication:  Melanie Madden is a 55 y.o. female with a history of urosepsis due to an obstructing 8 mm right UPJ calculus that required right nephrostomy tube placement in May 2022.  She was also noted to have sizable right renal stone burden.  She is here today for definitive stone treatment.  She has been consented for the above procedures, voices understanding and wishes to proceed.  Description of procedure:  After informed consent was obtained, the patient was brought to the operating room and general LMA anesthesia was administered. The patient was then placed in the dorsolithotomy position and prepped and draped in the usual sterile fashion. A timeout was performed. A 23 French rigid cystoscope was then inserted into the urethral meatus and advanced into the bladder under direct vision. A complete bladder survey revealed no intravesical pathology.  A 5 French ureteral catheter was then inserted into the right ureteral orifice and a retrograde pyelogram was obtained, with the findings listed above.  A Glidewire was then used to intubate the lumen of the ureteral catheter and was advanced up to the right renal pelvis, under  fluoroscopic guidance.  The catheter was then removed, leaving the wire in place.  An additional sensor wire was then advanced up the right ureter to the right renal pelvis, under fluoroscopic guidance.  Once wire access was gained to the right renal pelvis, her right-sided nephrostomy tube was removed intact, inspected and discarded.  A 35 cm, 14 French ureteral access sheath was then advanced over the sensor wire and into position within the proximal aspects of the right ureter.  A flexible ureteroscope was then advanced through the lumen of the ureteral access sheath and up to the level of the UPJ where an obstructing stone was identified.  A 200 m holmium laser was then used fracture the stone into 2 mm or less fragments.  The flexible ureteroscope was then advanced into the right renal pelvis where a full inspection revealed a large right renal stone that the holmium laser was then used to dust in a similar fashion.  The flexible ureteroscope was then removed with the ureteral access sheath, under direct vision.  There were no stone fragments or evidence of ureteral trauma seen as the ureteroscope was removed.  A 6 French, 24 cm JJ stent with a tether was then advanced over the Glidewire and into position within the right collecting system, confirming placement via fluoroscopy.  The patient's bladder was drained.  She tolerated the procedure well and was transferred to the postanesthesia in stable condition.  Plan: The patient has been instructed to remove her right ureteral stent at 7 AM on 02/13/2021.  She will follow-up in approximately 6 weeks for right renal ultrasound.

## 2021-02-08 NOTE — H&P (Signed)
Urology Preoperative H&P   Chief Complaint:  Right ureteral stone  History of Present Illness: Melanie Madden is a 55 y.o. female with a history of kidney stones, CVA with right hemiparesis and aphasia. She presented to the [facility] emergency department on 12/09/2020 with altered mental status following a syncopal episode. CT revealed an obstructing 8 mm right proximal ureteral calculus with mild to moderate right-sided hydronephrosis and signs of emphysematous pyelitis. IR was consulted and a right sided PCN was placed at that time.  She is here today for definitive treatment of her right sided stone burden.  She indicates that her nephrostomy tube is uncomfortable, but denies interval fevers/chills, dysuria or gross hematuria.    Past Medical History:  Diagnosis Date   Allergy    Anxiety    Arthritis    Expressive aphasia    Hemiparesis (HCC)    Right side   High triglycerides    History of kidney stones    Kidney stones    Recurrent sinus infections    Stroke [facility])     Past Surgical History:  Procedure Laterality Date   ANTERIOR CRUCIATE LIGAMENT REPAIR Left    BUNIONECTOMY Right    IR NEPHROSTOMY PLACEMENT RIGHT  12/09/2020   LASIK     LITHOTRIPSY     MENISCUS REPAIR Left    NASAL SEPTUM SURGERY     SHOULDER SURGERY     VARICOSE VEIN SURGERY Left     Allergies:  Allergies  Allergen Reactions   Amoxicillin Nausea Only   Shellfish Allergy Diarrhea and Nausea And Vomiting    Mussels     Family History  Problem Relation Age of Onset   Alzheimer's disease Mother    Hypertension Mother    Diabetes Mother    Thyroid disease Mother    Deep vein thrombosis Maternal Grandmother    Colon cancer Neg Hx    Colon polyps Neg Hx    Esophageal cancer Neg Hx    Pancreatic cancer Neg Hx    Rectal cancer Neg Hx    Stomach cancer Neg Hx    Breast cancer Neg Hx     Social History:  reports that she has quit smoking. She has never used smokeless tobacco. She  reports previous alcohol use. She reports that she does not use drugs.  ROS: A complete review of systems was performed.  All systems are negative except for pertinent findings as noted.  Physical Exam:  Vital signs in last 24 hours: Temp:  [98.5 F (36.9 C)] 98.5 F (36.9 C) (07/13 1201) Pulse Rate:  [73] 73 (07/13 1201) Resp:  [18] 18 (07/13 1201) BP: (141)/(90) 141/90 (07/13 1201) SpO2:  [99 %] 99 % (07/13 1201) Weight:  [63 kg] 63 kg (07/13 1155) Constitutional:  Alert and oriented, No acute distress.  Aphasic Cardiovascular: Regular rate and rhythm, No JVD Respiratory: Normal respiratory effort, Lungs clear bilaterally GI: Abdomen is soft, nontender, nondistended, no abdominal masses GU: Right PCN in place and draining clear-yellow urine.  Lymphatic: No lymphadenopathy Neurologic: Grossly intact, no focal deficits Psychiatric: Normal mood and affect  Laboratory Data:  No results for input(s): WBC, HGB, HCT, PLT in the last 72 hours.  No results for input(s): NA, K, CL, GLUCOSE, BUN, CALCIUM, CREATININE in the last 72 hours.  Invalid input(s): CO3   No results found for this or any previous visit (from the past 24 hour(s)). No results found for this or any previous visit (from the  past 240 hour(s)).  Renal Function: No results for input(s): CREATININE in the last 168 hours. Estimated Creatinine Clearance: 68.4 mL/min (by C-G formula based on SCr of 0.87 mg/dL).  Radiologic Imaging: CLINICAL DATA: Sepsis. History of obstructive renal calculi  EXAM: CT ABDOMEN AND PELVIS WITHOUT CONTRAST  TECHNIQUE: Multidetector CT imaging of the abdomen and pelvis was performed following the standard protocol without oral or IV contrast.  COMPARISON: None.  FINDINGS: Lower chest: There is ill-defined patchy opacity in the lung bases primarily due to atelectasis.  Hepatobiliary: No focal liver lesions are appreciable on this noncontrast enhanced study. The gallbladder is  mildly distended with apparent sludge within the gallbladder. No appreciable biliary duct dilatation.  Pancreas: There is no evident pancreatic mass or inflammatory focus.  Spleen: Probable small granuloma in the spleen. Spleen otherwise appears unremarkable.  Adrenals/Urinary Tract: Right adrenal appears normal. 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 Question left adrenal cyst versus adenoma. The attenuation of this mass is in the cystic range.  The right kidney is edematous. There are small areas of air within the right kidney, consistent with emphysematous pyelitis. There is mild hydronephrosis on the right. There is no appreciable hydronephrosis on the left. No air seen within the left kidney/collecting system. No renal masses. There are scattered 1-2 mm calculi throughout the left kidney. There is a 4 x 2 mm calculus in the lower pole left kidney. There are scattered 1-2 mm calculi throughout the right kidney. There are two adjacent 3 mm calculi in the mid to lower right kidney with nearby 1-2 mm calculi. There is a calculus in the mid to lower right kidney occupying a calyx measuring 1.8 x 0.8 cm. There is a calculus with immediately adjacent air at the right ureteropelvic junction measuring 8 x 6 mm. No other ureteral calculi are evident. Urinary bladder is midline with wall thickness overall increased.  Stomach/Bowel: Stomach filled with fluid. Gastric wall not thickened. No small or large bowel wall thickening evident. No appreciable bowel obstruction. Terminal ileum appears unremarkable. There are several calcifications within the appendix. Appendix is normal in size and contour without appendiceal wall thickening. There is no pneumoperitoneum or portal venous air.  Vascular/Lymphatic: Foci of aortic atherosclerosis. No aneurysm of the aorta evident. No adenopathy in the abdomen or pelvis.  Reproductive: Uterus anteverted. No uterine or adnexal masses  are evident.  Other: There is a small amount of ascites in the dependent portion of the pelvis. No abscess in the abdomen or pelvis.  Musculoskeletal: Degenerative change in the lower thoracic and lumbar regions. There is lumbar levoscoliosis. No blastic or lytic bone lesions. No intramuscular or abdominal wall lesions.  IMPRESSION: 1. Foci of air within right renal collecting system as well as at the right ureteropelvic junction consistent with a degree of emphysematous pyelitis. Mild hydronephrosis on the right. There is an 8 x 6 calculus with immediately adjacent air at the right ureteropelvic junction. No other ureteral calculi. Note that right kidney appears edematous.  2. Multiple calculi in each kidney, nonobstructing as noted above. Largest of these calculi measures 1.8 x 0.8 cm in a lower pole calyx on the right.  3. No appreciable bowel wall or mesenteric thickening. No bowel obstruction. No abscess in the abdomen or pelvis. Appendix normal in size and contour without inflammation. There are calcifications within the appendix.  4. Small amount of ascites in dependent portion pelvis.  5. Gallbladder is rather distended and contains sludge. This finding  may warrant gallbladder ultrasound to further evaluate.  6. 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  7. Probable atelectasis in the lung bases. Mild superimposed pneumonia in the bases is difficult to exclude in this circumstance.  8. Aortic Atherosclerosis (ICD10-I70.0).   Electronically Signed By: Lowella Grip III M.D. On: 12/09/2020 21:04  I independently reviewed the above imaging studies.  Assessment and Franklin Glennie Madden is a 55 y.o. female with an obstructing 8 mm right UPJ stone along with multiple right renal stones   The risks, benefits and alternatives of cystoscopy with RIGHT ureteroscopy, laser lithotripsy and ureteral stent placement was discussed the patient.   Risks included, but are not limited to: bleeding, urinary tract infection, ureteral injury/avulsion, ureteral stricture formation, retained stone fragments, the possibility that multiple surgeries may be required to treat the stone(s), MI, stroke, PE and the inherent risks of general anesthesia.  The patient voices understanding and wishes to proceed.     Ellison Hughs, MD 02/08/2021, 12:32 PM  [facility] Pager: [phone]

## 2021-02-08 NOTE — Anesthesia Preprocedure Evaluation (Addendum)
Anesthesia Evaluation  Patient identified by MRN, date of birth, ID band Patient awake    Reviewed: Allergy & Precautions, NPO status , Patient's Chart, lab work & pertinent test results  Airway Mallampati: II  TM Distance: >3 FB Neck ROM: Full    Dental no notable dental hx. (+) Teeth Intact, Dental Advisory Given   Pulmonary former smoker,    Pulmonary exam normal breath sounds clear to auscultation       Cardiovascular hypertension, Normal cardiovascular exam Rhythm:Regular Rate:Normal     Neuro/Psych  Headaches, R arm R Leg and Aphasia CVA, Residual Symptoms    GI/Hepatic negative GI ROS, Neg liver ROS,   Endo/Other    Renal/GU Renal diseaseLab Results      Component                Value               Date                      CREATININE               0.87                01/31/2021                BUN                      23 (H)              01/31/2021                NA                       136                 01/31/2021                K                        3.9                 01/31/2021                CL                       105                 01/31/2021                CO2                      23                  01/31/2021                Musculoskeletal  (+) Arthritis ,   Abdominal   Peds  Hematology Lab Results      Component                Value               Date                      WBC  5.3                 01/31/2021                HGB                      12.1                01/31/2021                HCT                      37.8                01/31/2021                MCV                      90.9                01/31/2021                PLT                      218                 01/31/2021              Anesthesia Other Findings   Reproductive/Obstetrics                            Anesthesia Physical Anesthesia Plan  ASA: 3  Anesthesia Plan:  General   Post-op Pain Management:    Induction: Intravenous  PONV Risk Score and Plan: 4 or greater and Treatment may vary due to age or medical condition, Ondansetron and Midazolam  Airway Management Planned: LMA  Additional Equipment:   Intra-op Plan:   Post-operative Plan:   Informed Consent: I have reviewed the patients History and Physical, chart, labs and discussed the procedure including the risks, benefits and alternatives for the proposed anesthesia with the patient or authorized representative who has indicated his/her understanding and acceptance.     Dental advisory given  Plan Discussed with: CRNA  Anesthesia Plan Comments:         Anesthesia Quick Evaluation

## 2021-02-08 NOTE — Transfer of Care (Signed)
Immediate Anesthesia Transfer of Care Note  Patient: Melanie Madden  Procedure(s) Performed: CYSTOSCOPY/RETROGRADE/URETEROSCOPY/HOLMIUM LASER/STENT PLACEMENT, NEPHROSTOMY TUBE REMOVAL (Right)  Patient Location: PACU  Anesthesia Type:General  Level of Consciousness: awake, alert , oriented and patient cooperative  Airway & Oxygen Therapy: Patient Spontanous Breathing and Patient connected to face mask oxygen  Post-op Assessment: Report given to RN and Post -op Vital signs reviewed and stable  Post vital signs: Reviewed and stable  Last Vitals:  Vitals Value Taken Time  BP 145/94 02/08/21 1441  Temp    Pulse 66 02/08/21 1443  Resp 11 02/08/21 1443  SpO2 100 % 02/08/21 1443  Vitals shown include unvalidated device data.  Last Pain:  Vitals:   02/08/21 1201  TempSrc: Oral  PainSc:       Patients Stated Pain Goal: 2 (70/14/10 3013)  Complications: No notable events documented.

## 2021-02-08 NOTE — Anesthesia Postprocedure Evaluation (Signed)
Anesthesia Post Note  Patient: Melanie Madden  Procedure(s) Performed: CYSTOSCOPY/RETROGRADE/URETEROSCOPY/HOLMIUM LASER/STENT PLACEMENT, NEPHROSTOMY TUBE REMOVAL (Right)     Patient location during evaluation: PACU Anesthesia Type: General Level of consciousness: awake and alert Pain management: pain level controlled Vital Signs Assessment: post-procedure vital signs reviewed and stable Respiratory status: spontaneous breathing, nonlabored ventilation, respiratory function stable and patient connected to nasal cannula oxygen Cardiovascular status: blood pressure returned to baseline and stable Postop Assessment: no apparent nausea or vomiting Anesthetic complications: no   No notable events documented.  Last Vitals:  Vitals:   02/08/21 1515 02/08/21 1532  BP: (!) 142/83 (!) 153/84  Pulse: 71 62  Resp: 15 16  Temp:  36.7 C  SpO2: 95% 95%    Last Pain:  Vitals:   02/08/21 1532  TempSrc:   PainSc: 0-No pain                 Barnet Glasgow

## 2021-02-08 NOTE — Anesthesia Procedure Notes (Signed)
Procedure Name: LMA Insertion Date/Time: 02/08/2021 1:13 PM Performed by: Cleda Daub, CRNA Pre-anesthesia Checklist: Patient identified, Emergency Drugs available, Suction available and Patient being monitored Patient Re-evaluated:Patient Re-evaluated prior to induction Oxygen Delivery Method: Circle system utilized Preoxygenation: Pre-oxygenation with 100% oxygen Induction Type: IV induction LMA: LMA inserted LMA Size: 4.0 Number of attempts: 1 Placement Confirmation: positive ETCO2 and breath sounds checked- equal and bilateral Tube secured with: Tape Dental Injury: Teeth and Oropharynx as per pre-operative assessment

## 2021-02-09 ENCOUNTER — Encounter (HOSPITAL_COMMUNITY): Payer: Self-pay | Admitting: Urology

## 2021-02-16 ENCOUNTER — Telehealth: Payer: Self-pay | Admitting: *Deleted

## 2021-02-16 NOTE — Addendum Note (Signed)
Addended by: Charlett Blake on: 02/16/2021 03:07 PM   Modules accepted: Orders

## 2021-02-16 NOTE — Telephone Encounter (Signed)
Mr Joylene John called asking to speak with Dr Read Drivers about some issues with PT/OT.  His number is [phone].

## 2021-03-07 ENCOUNTER — Telehealth: Payer: Self-pay | Admitting: *Deleted

## 2021-03-07 DIAGNOSIS — I610 Nontraumatic intracerebral hemorrhage in hemisphere, subcortical: Secondary | ICD-10-CM

## 2021-03-07 DIAGNOSIS — R4701 Aphasia: Secondary | ICD-10-CM

## 2021-03-07 DIAGNOSIS — I69319 Unspecified symptoms and signs involving cognitive functions following cerebral infarction: Secondary | ICD-10-CM

## 2021-03-07 NOTE — Telephone Encounter (Signed)
Order placed and Mr [place] notified.

## 2021-03-07 NOTE — Telephone Encounter (Signed)
Mr Joylene John called and says that Dr Letta Pate ordered PT/OT for Melanie Madden at [facility] but she really needs ST as well.

## 2021-03-07 NOTE — Telephone Encounter (Signed)
I thought I ordered SLP but if I did not please order

## 2021-03-14 ENCOUNTER — Ambulatory Visit: Payer: Managed Care, Other (non HMO)

## 2021-03-14 ENCOUNTER — Ambulatory Visit: Payer: Managed Care, Other (non HMO) | Attending: Family Medicine | Admitting: Physical Therapy

## 2021-03-14 ENCOUNTER — Other Ambulatory Visit: Payer: Self-pay

## 2021-03-14 DIAGNOSIS — I69153 Hemiplegia and hemiparesis following nontraumatic intracerebral hemorrhage affecting right non-dominant side: Secondary | ICD-10-CM | POA: Diagnosis not present

## 2021-03-14 DIAGNOSIS — I69151 Hemiplegia and hemiparesis following nontraumatic intracerebral hemorrhage affecting right dominant side: Secondary | ICD-10-CM | POA: Insufficient documentation

## 2021-03-14 DIAGNOSIS — R482 Apraxia: Secondary | ICD-10-CM | POA: Diagnosis present

## 2021-03-14 DIAGNOSIS — R4701 Aphasia: Secondary | ICD-10-CM | POA: Insufficient documentation

## 2021-03-14 DIAGNOSIS — R2681 Unsteadiness on feet: Secondary | ICD-10-CM | POA: Insufficient documentation

## 2021-03-14 DIAGNOSIS — R293 Abnormal posture: Secondary | ICD-10-CM | POA: Diagnosis present

## 2021-03-14 DIAGNOSIS — R2689 Other abnormalities of gait and mobility: Secondary | ICD-10-CM | POA: Diagnosis present

## 2021-03-14 DIAGNOSIS — M6281 Muscle weakness (generalized): Secondary | ICD-10-CM | POA: Insufficient documentation

## 2021-03-14 NOTE — Therapy (Signed)
[facility] [address] [place], [place], [address] Phone: [phone]   Fax:  [phone]  Patient Details  Name: Melanie Madden MRN: [id number] Date of Birth: 03/27/1966 Referring Provider:  No ref. provider found  Encounter Date: 03/14/2021 PHYSICAL THERAPY DISCHARGE SUMMARY- non visit discharge  Visits from Start of Care: 9  Current functional level related to goals / functional outcomes: Unknown as pt entered the hospital.   Remaining deficits: At time of last visit right hemiparesis and aphasia   Education / Equipment:    Patient agrees to discharge. Patient goals were not met. Patient is being discharged due to a change in medical status.    A , PT, DPT, NCS 03/14/2021, 12:51 PM  [facility] [address] [place], [place], [address] Phone: [phone]   Fax:  [phone] 

## 2021-03-14 NOTE — Therapy (Signed)
[facility] [address] [place], [place], [address] Phone: [phone]   Fax:  [phone]  Speech Language Pathology Evaluation  Patient Details  Name: Melanie Madden MRN: [id number] Date of Birth: 1966-01-26 Referring Provider (SLP): Charlett Blake, MD   Encounter Date: 03/14/2021   End of Session - 03/14/21 1415     Visit Number 1    Number of Visits 13    Date for SLP Re-Evaluation 04/28/21    Authorization Time Period 60 days combined; 8 used at this time    SLP Start Time 3    SLP Stop Time  1315    SLP Time Calculation (min) 45 min    Activity Tolerance Patient tolerated treatment well             Past Medical History:  Diagnosis Date   Allergy    Anxiety    Arthritis    Expressive aphasia    Hemiparesis ([address])    Right side   High triglycerides    History of kidney stones    Kidney stones    Recurrent sinus infections    Stroke [facility])     Past Surgical History:  Procedure Laterality Date   ANTERIOR CRUCIATE LIGAMENT REPAIR Left    BUNIONECTOMY Right    CYSTOSCOPY/URETEROSCOPY/HOLMIUM LASER/STENT PLACEMENT Right 02/08/2021   Procedure: CYSTOSCOPY/RETROGRADE/URETEROSCOPY/HOLMIUM LASER/STENT PLACEMENT, NEPHROSTOMY TUBE REMOVAL;  Surgeon: Ceasar Mons, MD;  Location: [facility];  Service: Urology;  Laterality: Right;   IR NEPHROSTOMY PLACEMENT RIGHT  12/09/2020   LASIK     LITHOTRIPSY     MENISCUS REPAIR Left    NASAL SEPTUM SURGERY     SHOULDER SURGERY     VARICOSE VEIN SURGERY Left     There were no vitals filed for this visit.       SLP Evaluation [facility] - 03/14/21 1226       SLP Visit Information   SLP Received On 03/07/21    Referring Provider (SLP) Charlett Blake, MD    Onset Date December 2020    Medical Diagnosis Nontraumatic subcortical hemorrhage of left cerebral hemisphere [facility]); Global aphasia; Residual cognitive deficit as late effect  of stroke      Subjective   Subjective unintelligible response and vague gesture      Pain Assessment   Currently in Pain? 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 Pt receieved OPST for 2 months in 2022 and for 6 months in 2021, with minimal progress noted secondary to severity of expressive deficits and previous behaviors. Pt previously expressed desire to be verbal communicator versus use her own Lingraphica device    Behavioral/Cognition pt elected for caregiver not to attend ST eval    Mobility Status walked with cane - PT eval today      Balance Screen   Has the patient fallen in the past 6 months No      Prior Functional Status   Cognitive/Linguistic Baseline Within functional limits    Type of Home House     Lives With Spouse    Available Support Personal care attendant      Cognition   Overall Cognitive Status Difficult to assess    Difficult to assess due to Impaired communication      Auditory Comprehension   Overall Auditory Comprehension Impaired    Yes/No Questions Impaired    Basic  Biographical Questions 76-100% accurate    Basic Immediate Environment Questions 75-100% accurate    Complex Questions 50-74% accurate    Commands Impaired    One Step Basic Commands 50-74% accurate    Two Step Basic Commands 25-49% accurate    Conversation Simple    Interfering Components Motor planning;Processing speed;Working Engineer, maintenance Mode of Expression Nonverbal - gestures      Verbal Expression   Overall Verbal Expression Impaired    Initiation Impaired    Level of Generative/Spontaneous Verbalization Word    Repetition Impaired    Level of Impairment Word level    Naming Impairment    Responsive 0-25% accurate    Confrontation 0-24% accurate    Verbal Errors Aware of errors;Not aware of errors;Jargon;Neologisms;Phonemic paraphasias;Perseveration     Interfering Components Speech intelligibility;Other (comment)   apraxia   Non-Verbal Means of Communication Writing;Gestures;Communication board   inconsistent use of Lingraphica     Written Expression   Dominant Hand Right    Written Expression Exceptions to [facility]    Copy Ability Phrase    Self Formulation Ability Word    Interfering Components Utilizes non-dominant hand;Legibility      Oral Motor/Sensory Function   Overall Oral Motor/Sensory Function Impaired    Labial ROM Other (Comment)   reduced bilaterally   Labial Symmetry Abnormal symmetry right    Labial Coordination Reduced    Lingual ROM Reduced right;Reduced left    Lingual Symmetry Within Functional Limits    Lingual Strength Reduced    Lingual Coordination Reduced    Facial Symmetry Right droop    Facial Coordination Reduced      Motor Speech   Overall Motor Speech Impaired    Articulation Impaired    Intelligibility Intelligibility reduced    Word 0-24% accurate    Phrase 0-24% accurate    Conversation 0-24% accurate    Level of Impairment Word    Motor Speech Errors Unaware;Groping for words;Inconsistent      Standardized Assessments   Standardized Assessments  Other Assessment   [place] Evidence-Based Language Test   Other Assessment [phone]   Perceptual (13/15), Auditory Comprehension (21/24), Verbal Expression (1/19), Reading (3/4), and Writing (5/5)                            SLP Education - 03/14/21 1421     Education Details eval results, possible goals, will plan to target more augmentive supports due to baseline apraxia/aphasia limiting verbal expression    Person(s) Educated Patient    Methods Explanation;Demonstration    Comprehension Verbalized understanding;Returned demonstration              SLP Short Term Goals - 03/14/21 2126       SLP SHORT TERM GOAL #1   Title Pt will utilize external communication supports to augment verbal expression for 4/5 opportunities given  occasional mod A over 2 sessions    Time 4    Period Weeks    Status New    Target Date 04/14/21      SLP SHORT TERM GOAL #2   Title Pt will accurately select written prompt to communicate wants/needs with 75% accuracy given occasional mod A over 2 sessions    Time 4    Period Weeks    Status New    Target Date 04/14/21      SLP SHORT TERM GOAL #3   Title  Pt will accurately approximate 1-2 phonemes of targeted icons/words on communications supports with 25% accuracy given consistent verbal modeling over 2 session    Time 4    Period Weeks    Status New    Target Date 04/14/21              SLP Long Term Goals - 03/14/21 1420       SLP LONG TERM GOAL #1   Title Pt will spontaneously utilize external communication supports to augment verbal expression for 4/5 opportunities over 3 sessions    Time 6    Period Weeks    Status New    Target Date 04/28/21      SLP LONG TERM GOAL #2   Title Pt will ID personally relevant objects in f:8 for communication of wants/needs with 75% accuracy given rare min A over 3 sessions    Time 6    Period Weeks    Status New    Target Date 04/28/21      SLP LONG TERM GOAL #3   Title Pt will use mulitmodal communication (gesture, draw, write 1st letter etc) to augment verbal expression to meet needs at home with rare min A from family or caregivers over 2 sessions    Time 6    Period Weeks    Status New    Target Date 04/28/21      SLP LONG TERM GOAL #4   Title Pt will accurately approximate 1-2 phonemes of targeted icons/words on communications supports with 50% accuracy given consistent verbal modeling over 2 session    Time 6    Period Weeks    Status New    Target Date 04/28/21              Plan - 03/14/21 1416     Clinical Impression Statement Melanie Madden presents today for OPST evaluation to evaluate 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 Pt most recently participated in OPST intervention from March to May  2022 to address verbal expression per patient request, with limited progress achieved secondary to severity of global aphasia and oral apraxia prior to hospitalization. [place] Evidence-Based Language Test ([employer]) completed this session, with score of 43/67 achieved (09/2019 score was 48/67). Pt noted again with some inconsistent yes/no responses, inconsistent abilty to follow simple commands (possibily related to limb apraxia), and persistent unintelligible verbal output as speech is still profoundly impacted by apraxic errors and perseverations. Pt continues to rely heavily on gestures (some vague, some effective) to aid unintelligible speech productions. Given similiar baseline to last course of ST intervention, SLP educated patient on recommendation to focus ST sessions on optimizing communication device and utilizing communication supports to aid verbal expression due to severity of aphasia and apraxia. SLP recommends skilled ST intervention to maximize current communication skills via external communication supports to increase communication effectiveness as well as possibly target some verbal speech related tasks as pt desires to speak.    Speech Therapy Frequency 2x / week    Duration Other (comment)   scheduled for 6 weeks to account for scheduling and missed appointments (anticipate d/c after 4 weeks)   Treatment/Interventions Oral motor exercises;Compensatory strategies;Cueing hierarchy;Functional tasks;Patient/family education;Cognitive reorganization;Multimodal communcation approach;Language facilitation;Compensatory techniques;Internal/external aids;SLP instruction and feedback    Potential to Achieve Goals Fair    Potential Considerations Previous level of function;Severity of impairments;Ability to learn/carryover information    SLP Home Exercise Plan provided    Consulted and Agree with Plan of  Care Patient             Patient will benefit from skilled therapeutic  intervention in order to improve the following deficits and impairments:   Aphasia  Verbal apraxia    Problem List Patient Active Problem List   Diagnosis Date Noted   Urinary retention    E. 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

## 2021-03-15 NOTE — Therapy (Signed)
[facility] [address] [place], [place], [address] Phone: [phone]   Fax:  [phone]  Physical Therapy Evaluation  Patient Details  Name: Melanie Madden MRN: [id number] Date of Birth: 04-21-1966 Referring Provider (PT): Charlett Blake, MD   Encounter Date: 03/14/2021   PT End of Session - 03/15/21 1309     Visit Number 1    Number of Visits 17    Date for PT Re-Evaluation 05/14/21    Authorization Type [employer]; $50 copay for each discipline.  VL: 60, 8 used.  If she sees all three in same day, 3 visits used    Authorization - Visit Number 10    Authorization - Number of Visits 60    PT Start Time [id number]    PT Stop Time 1400    PT Time Calculation (min) 43 min    Activity Tolerance Patient tolerated treatment well    Behavior During Therapy WFL for tasks assessed/performed             Past Medical History:  Diagnosis Date   Allergy    Anxiety    Arthritis    Expressive aphasia    Hemiparesis ([place])    Right side   High triglycerides    History of kidney stones    Kidney stones    Recurrent sinus infections    Stroke [facility])     Past Surgical History:  Procedure Laterality Date   ANTERIOR CRUCIATE LIGAMENT REPAIR Left    BUNIONECTOMY Right    CYSTOSCOPY/URETEROSCOPY/HOLMIUM LASER/STENT PLACEMENT Right 02/08/2021   Procedure: CYSTOSCOPY/RETROGRADE/URETEROSCOPY/HOLMIUM LASER/STENT PLACEMENT, NEPHROSTOMY TUBE REMOVAL;  Surgeon: Ceasar Mons, MD;  Location: [facility];  Service: Urology;  Laterality: Right;   IR NEPHROSTOMY PLACEMENT RIGHT  12/09/2020   LASIK     LITHOTRIPSY     MENISCUS REPAIR Left    NASAL SEPTUM SURGERY     SHOULDER SURGERY     VARICOSE VEIN SURGERY Left     There were no vitals filed for this visit.    Subjective Assessment - 03/14/21 1321     Subjective Pt non verbal but uses non verbal communication appropriately.  Hospitalized for UTI; pt  indicating RLE is weaker and she is deconditioned.    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 No/denies                [facility] PT Assessment - 03/14/21 1321       Assessment   Medical Diagnosis Debility; I61.0 (ICD-10-CM) - Nontraumatic subcortical hemorrhage of left cerebral hemisphere [facility])    Referring Provider (PT) Charlett Blake, MD    Onset Date/Surgical Date 12/09/20    Hand Dominance Right    Prior Therapy CIR, outpatient      Precautions   Precautions Other (comment)    Precaution Comments anxiety, migraines, HTN, OA, expressive aphasia, R hemiplegia, kidney stones, L ACL repair, R bunionectomy, L meniscus repair, shoulder surgery    Required Braces or Orthoses Other Brace/Splint    Other Brace/Splint R AFO      Balance Screen   Has the patient fallen in the past 6 months No    Has  the patient had a decrease in activity level because of a fear of falling?  Yes      [place] Private residence    Living Arrangements Spouse/significant other    Available Help at Discharge Family    Type of [place] entrance    [place] Two level    Alternate Level Stairs-Number of Steps [place] - quad;Bedside commode;Tub bench;Hand held shower head;Wheelchair - [employer] - 2 wheels    Additional Comments right anterior AFO      Prior Function   Level of Independence Independent with household mobility with device;Requires assistive device for independence;Needs assistance with ADLs    Vocation On disability      Cognition   Difficult to assess due to Impaired communication      Posture/Postural  Control   Posture/Postural Control Postural limitations    Postural Limitations Rounded Shoulders;Forward head;Posterior pelvic tilt;Decreased lumbar lordosis;Weight shift left      ROM / Strength   AROM / PROM / Strength Strength      Strength   Overall Strength Deficits    Overall Strength Comments Decreased compared to a few months ago    Right/Left Hip Right    Right Hip Flexion 1/5    Right/Left Knee Right    Right Knee Flexion 1/5    Right Knee Extension 2-/5    Right/Left Ankle Right    Right Ankle Dorsiflexion 1/5      Transfers   Transfers Sit to Stand;Stand to Sit    Sit to Stand 4: Min guard    Sit to Stand Details (indicate cue type and reason) When performing sit > stand pt stands with all weight shifted to LLE; RLE remains flexed, externally rotated and non-WB.    Stand to Sit 4: Min guard      Ambulation/Gait   Ambulation/Gait Yes    Ambulation/Gait Assistance 5: Supervision    Ambulation/Gait Assistance Details continues to ambulate with quad cane and AFO.    Ambulation Distance (Feet) 100 Feet    Assistive device Small based quad cane    Gait Pattern Step-to pattern;Decreased arm swing - right;Decreased step length - left;Decreased stance time - right;Decreased stride length;Decreased hip/knee flexion - right;Decreased dorsiflexion - right;Decreased weight shift to right;Lateral trunk lean to left    Ambulation Surface Level;Indoor    Gait velocity 0.18 m/sec      6 Minute Walk- Baseline   6 Minute Walk- Baseline yes    BP (mmHg) 154/104    HR (bpm) 86    02 Sat (%RA) 98 %    Modified Borg Scale for Dyspnea 0- Nothing at all    Perceived Rate of Exertion (Borg) 7- Very, very light      6 minute walk test results    Endurance additional comments Did not perform today due to elevated BP      Standardized Balance Assessment   Standardized Balance Assessment Berg Balance Test;10 meter walk test    10 Meter Walk with quad cane: 55 seconds or 0.18 m/sec with  supervision.  HR: 94 bpm after performing.  Moderate fatigue afterwards.      Berg Balance Test   Sit to Stand Able to stand using hands after several tries    Standing Unsupported Able to stand 30 seconds unsupported    Sitting with Back Unsupported but Feet Supported on Floor or Stool Able to sit  safely and securely 2 minutes    Stand to Sit Sits independently, has uncontrolled descent    Transfers Able to transfer safely, definite need of hands    Standing Unsupported with Eyes Closed Needs help to keep from falling   holds to cane   Standing Unsupported with Feet Together Needs help to attain position and unable to hold for 15 seconds   holds cane for support   From Standing, Reach Forward with Outstretched Arm Can reach forward >12 cm safely (5")    From Standing Position, Pick up Object from Floor Unable to pick up and needs supervision    From Standing Position, Turn to Look Behind Over each Shoulder Looks behind one side only/other side shows less weight shift    Turn 360 Degrees Needs assistance while turning    Standing Unsupported, Alternately Place Feet on Step/Stool Able to complete >2 steps/needs minimal assist    Standing Unsupported, One Foot in Front Needs help to step but can hold 15 seconds    Standing on One Leg Unable to try or needs assist to prevent fall    Total Score 21    Berg comment: 21/56                        Objective measurements completed on examination: See above findings.               PT Education - 03/15/21 1309     Education Details clinical findings, PT POC and goals.  Discussed if pt wished to pursue OT at this time, pt shook her head "No."    Person(s) Educated Patient    Methods Explanation    Comprehension Verbalized understanding              PT Short Term Goals - 03/15/21 1321       PT SHORT TERM GOAL #1   Title Pt will perform 2/6 minute walk test of endurance with quad cane    Time 4    Period Weeks     Status New    Target Date 04/14/21      PT SHORT TERM GOAL #2   Title Pt will re-initiate updated HEP with supervision    Time 4    Period Weeks    Status New    Target Date 04/14/21      PT SHORT TERM GOAL #3   Title Pt will increase BERG by 4 points to indicate decreased falls risk    Baseline 21/56    Time 4    Period Weeks    Status New    Target Date 04/14/21      PT SHORT TERM GOAL #4   Title Pt will increase gait velocity to 0.25 m/sec with LRAD and supervision    Baseline .18 m/sec with quad cane    Time 4    Period Weeks    Status New    Target Date 04/14/21               PT Long Term Goals - 03/15/21 1323       PT LONG TERM GOAL #1   Title Pt will be able to perform progressive HEP for strengthening, balance and mobility with assist of husband to continued gains at home.    Time 8    Period Weeks    Status New    Target Date 05/14/21      PT LONG TERM GOAL #2  Title Pt will increase BERG balance to >/= 30/56 to indicate decreased falls risk    Baseline 21/56    Time 8    Period Weeks    Status New    Target Date 05/14/21      PT LONG TERM GOAL #3   Title Pt will increase functional endurance to be able to complete full 6 minute walk test and report 12-13 on RPE    Baseline TBA    Time 8    Period Weeks    Status New    Target Date 05/14/21      PT LONG TERM GOAL #4   Title Pt will increase gait velocity to >/- 0.30 m/sec for improved household mobility with LRAD and supervision    Baseline .18 m/sec    Time 8    Period Weeks    Status New    Target Date 05/14/21      PT LONG TERM GOAL #5   Title Pt will perform sit > stand MOD I with equal WB through R and LLE to indicate improved functional strength in RLE for transfers    Baseline weight shifted to LLE only    Time 8    Period Weeks    Status New    Target Date 05/14/21                    Plan - 03/15/21 1311     Clinical Impression Statement Pt is a 55 year old  female referred back to Neuro OPPT for evaluation of deconditioning after being hospitalized for kidney stone and ureteral obstruction.  Pt is well known to this therapist and was participating in outpatient rehab for chronic sequelae of CVA.  The following deficits were noted during pt's exam: regression of RLE strength, impaired standing balance, impaired functional endurance/activity tolerance and impaired gait.  Pt's gait velocity and BERG scores indicate pt is at high risk for falls. Pt would benefit from skilled PT to address these impairments and functional limitations to maximize functional mobility independence and reduce falls risk.    Personal Factors and Comorbidities Behavior Pattern;Comorbidity 3+;Past/Current Experience;Time since onset of injury/illness/exacerbation    Comorbidities anxiety, migraines, HTN, OA, expressive aphasia, R hemiplegia, kidney stones, L ACL repair, R bunionectomy, L meniscus repair, shoulder surgery, stroke    Examination-Activity Limitations Transfers;Stairs;Locomotion Level;Stand;Bend    Examination-Participation Restrictions Community Activity    Stability/Clinical Decision Making Evolving/Moderate complexity    Clinical Decision Making Moderate    Rehab Potential Good    PT Frequency 2x / week    PT Duration 8 weeks    PT Treatment/Interventions ADLs/Self Care Home Management;DME Instruction;Therapeutic activities;Functional mobility training;Stair training;Gait training;Therapeutic exercise;Balance training;Neuromuscular re-education;Patient/family education;Manual techniques;Passive range of motion;Orthotic Fit/Training    PT Next Visit Plan Check BP-running high again.  Perform 2 or 6 minute walk to assess endurance.  Revise and restart HEP; re-start BWS over treadmill with ~30# unweighted. Performed at 0.74mh last visit with medium harness. Continue to work on right anterior pelvic rotation as tends to be posteriorlly rotated to help initiate more hip  flexion. Work on weight shifting to right in standing, functional strengthening, gait training with SBQC. Continue tall kneeling activities.    PT Home Exercise Plan Access Code: [email or web address] URL: [email or web address]    Consulted and Agree with Plan of Care Patient             Patient will benefit from skilled therapeutic intervention in order to improve the  following deficits and impairments:  Abnormal gait, Decreased activity tolerance, Decreased balance, Decreased endurance, Decreased mobility, Decreased strength, Difficulty walking, Impaired sensation, Impaired tone, Postural dysfunction  Visit Diagnosis: Other abnormalities of gait and mobility  Muscle weakness (generalized)  Hemiplegia and hemiparesis following nontraumatic intracerebral hemorrhage affecting right dominant side (HCC)  Abnormal posture  Unsteadiness on feet     Problem List Patient Active Problem List   Diagnosis Date Noted   Urinary retention    E. 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

## 2021-03-15 NOTE — Patient Instructions (Signed)
Access Code: [email or web address] URL: [email or web address]

## 2021-03-22 ENCOUNTER — Other Ambulatory Visit: Payer: Self-pay

## 2021-03-22 ENCOUNTER — Ambulatory Visit: Payer: Managed Care, Other (non HMO)

## 2021-03-22 DIAGNOSIS — M6281 Muscle weakness (generalized): Secondary | ICD-10-CM

## 2021-03-22 DIAGNOSIS — R2689 Other abnormalities of gait and mobility: Secondary | ICD-10-CM

## 2021-03-22 DIAGNOSIS — R482 Apraxia: Secondary | ICD-10-CM

## 2021-03-22 DIAGNOSIS — R4701 Aphasia: Secondary | ICD-10-CM

## 2021-03-22 NOTE — Therapy (Signed)
[facility] [address] [place], [place], [address] Phone: [phone]   Fax:  [phone]  Speech Language Pathology Treatment  Patient Details  Name: Melanie Madden MRN: [id number] Date of Birth: [birth year] Referring Provider (SLP): Charlett Blake, MD   Encounter Date: 03/22/2021   End of Session - 03/22/21 1304     Visit Number 2    Number of Visits 13    Date for SLP Re-Evaluation 04/28/21    Authorization Time Period 60 days combined    SLP Start Time 1315    SLP Stop Time  1400    SLP Time Calculation (min) 45 min    Activity Tolerance Patient tolerated treatment well             Past Medical History:  Diagnosis Date   Allergy    Anxiety    Arthritis    Expressive aphasia    Hemiparesis ([place])    Right side   High triglycerides    History of kidney stones    Kidney stones    Recurrent sinus infections    Stroke [facility])     Past Surgical History:  Procedure Laterality Date   ANTERIOR CRUCIATE LIGAMENT REPAIR Left    BUNIONECTOMY Right    CYSTOSCOPY/URETEROSCOPY/HOLMIUM LASER/STENT PLACEMENT Right 02/08/2021   Procedure: CYSTOSCOPY/RETROGRADE/URETEROSCOPY/HOLMIUM LASER/STENT PLACEMENT, NEPHROSTOMY TUBE REMOVAL;  Surgeon: Ceasar Mons, MD;  Location: [facility];  Service: Urology;  Laterality: Right;   IR NEPHROSTOMY PLACEMENT RIGHT  12/09/2020   LASIK     LITHOTRIPSY     MENISCUS REPAIR Left    NASAL SEPTUM SURGERY     SHOULDER SURGERY     VARICOSE VEIN SURGERY Left     There were no vitals filed for this visit.   Subjective Assessment - 03/22/21 1309     Subjective nodded head    Currently in Pain? No/denies                   ADULT SLP TREATMENT - 03/22/21 1303       General Information   Behavior/Cognition Alert;Cooperative;Pleasant mood;Impulsive      Cognitive-Linquistic Treatment   Treatment focused on Aphasia;Apraxia    Skilled  Treatment Pt is accompanied by her caregiver today. SLP reviewed POC to primarily address communication via external supports and secondarily targeting speech due to severity of apraxia and global aphasia. SLP educated caregiver on apraxia of speech impacting pt's consistency and articulation. Pt's aid reports she makes her say words and practice at home via writing and naming. SLP demonstrated ways to utilize communication aids (visual pictures and Lingraphica). Pt able to ID cards with verbal prompt with 100% accuracy given occasional self-corrections. Pt able to ID icon on Lingraphica given verbal question with ~50% accuracy, requring cues. SLP recommended patient utilize communication supports every day to augment verbal expression.      Assessment / Recommendations / Plan   Plan Continue with current plan of care      Progression Toward Goals   Progression toward goals Progressing toward goals              SLP Education - 03/22/21 1410     Education Details plan to target use of communication aids versus verbal expression, modeling and demonstration of how to implement use of communication supports    Person(s) Educated Patient;Caregiver(s)    Methods Explanation;Demonstration;Verbal cues    Comprehension Verbalized understanding;Returned demonstration;Verbal cues required;Need further instruction  SLP Short Term Goals - 03/22/21 1304       SLP SHORT TERM GOAL #1   Title Pt will utilize external communication supports to augment verbal expression for 4/5 opportunities given occasional mod A over 2 sessions    Time 4    Period Weeks    Status On-going    Target Date 04/14/21      SLP SHORT TERM GOAL #2   Title Pt will accurately select written prompt to communicate wants/needs with 75% accuracy given occasional mod A over 2 sessions    Time 4    Period Weeks    Status On-going    Target Date 04/14/21      SLP SHORT TERM GOAL #3   Title Pt will accurately  approximate 1-2 phonemes of targeted icons/words on communications supports with 25% accuracy given consistent verbal modeling over 2 session    Time 4    Period Weeks    Status On-going    Target Date 04/14/21              SLP Long Term Goals - 03/22/21 1305       SLP LONG TERM GOAL #1   Title Pt will spontaneously utilize external communication supports to augment verbal expression for 4/5 opportunities over 3 sessions    Time 6    Period Weeks    Status On-going    Target Date 04/28/21      SLP LONG TERM GOAL #2   Title Pt will ID personally relevant objects in f:8 for communication of wants/needs with 75% accuracy given rare min A over 3 sessions    Time 6    Period Weeks    Status On-going    Target Date 04/28/21      SLP LONG TERM GOAL #3   Title Pt will use mulitmodal communication (gesture, draw, write 1st letter etc) to augment verbal expression to meet needs at home with rare min A from family or caregivers over 2 sessions    Time 6    Period Weeks    Status On-going    Target Date 04/28/21      SLP LONG TERM GOAL #4   Title Pt will accurately approximate 1-2 phonemes of targeted icons/words on communications supports with 50% accuracy given consistent verbal modeling over 2 session    Time 6    Period Weeks    Status On-going    Target Date 04/28/21              Plan - 03/22/21 1304     Clinical Impression Statement Melanie Madden presents today for ST intervention to address 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 Given similiar baseline to last course of ST intervention and minimal progress with verbal expression since onset of stroke, SLP re-educated patient and caregiver on recommendation to primarily focus ST sessions on optimizing communication device and utilizing communication supports to aid verbal expression due to severity of aphasia and apraxia. SLP provided rare to occasional verbal and visual cues to utilize communication  supports to augment communication basic wants/needs and preferences. SLP recommends skilled ST intervention to maximize current communication skills via communication supports to increase communication effectiveness as well as less directly target some speech related tasks as pt desires to speak.    Speech Therapy Frequency 2x / week    Duration Other (comment)   scheduled for 6 weeks to account for scheduling and missed appointments (anticipate d/c after 4 weeks)  Treatment/Interventions Oral motor exercises;Compensatory strategies;Cueing hierarchy;Functional tasks;Patient/family education;Cognitive reorganization;Multimodal communcation approach;Language facilitation;Compensatory techniques;Internal/external aids;SLP instruction and feedback    Potential to Achieve Goals Fair    Potential Considerations Previous level of function;Severity of impairments;Ability to learn/carryover information;Cooperation/participation level    SLP Home Exercise Plan provided    Consulted and Agree with Plan of Care Patient             Patient will benefit from skilled therapeutic intervention in order to improve the following deficits and impairments:   Aphasia  Verbal apraxia    Problem List Patient Active Problem List   Diagnosis Date Noted   Urinary retention    E. 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 [place], [place], [address] Phone: [phone]   Fax:  [phone]   Name: Melanie Madden MRN: [id number] Date of Birth: 1965/11/21

## 2021-03-22 NOTE — Patient Instructions (Signed)
Dee or [name] will ask a question and Chaneta will find the answer in the [place].    Ask these questions and direct  What is the day of week? What do you want for breakfast? Where do you want to go today?

## 2021-03-22 NOTE — Patient Instructions (Signed)
Access Code: [email or web address] URL: [email or web address] Date: 03/22/2021 Prepared by: Cherly Anderson  Exercises Bridge with Arms at [facility] and Feet on Swiss Ball - 1 x daily - 7 x weekly - 2 sets - 10 reps Supine Single Leg Hip and Knee Flexion ROM with Swiss Ball - 1 x daily - 7 x weekly - 2 sets - 10 reps Hip Abduction and Adduction Caregiver PROM - 1 x daily - 7 x weekly - 2 sets - 10 reps Sit to Stand - 1 x daily - 7 x weekly - 2 sets - 5 reps Side to Side Weight Shift with Counter Support - 1 x daily - 7 x weekly - 2 sets - 10 reps Seated Pelvic Tilt on Balance Disk - 1 x daily - 7 x weekly - 3 sets - 10 reps Seated lateral pelvic tilt on pillow - 1 x daily - 7 x weekly - 3 sets - 10 reps

## 2021-03-22 NOTE — Therapy (Signed)
[facility] [address] [place], [place], [address] Phone: [phone]   Fax:  [phone]  Physical Therapy Treatment  Patient Details  Name: Melanie Madden MRN: [id number] Date of Birth: 11/02/1965 Referring Provider (PT): Charlett Blake, MD   Encounter Date: 03/22/2021   PT End of Session - 03/22/21 1421     Visit Number 2    Number of Visits 17    Date for PT Re-Evaluation 05/14/21    Authorization Type [employer]; $50 copay for each discipline.  VL: 60, 8 used.  If she sees all three in same day, 3 visits used    Authorization - Visit Number 10    Authorization - Number of Visits 51    PT Start Time 1402    PT Stop Time 1447    PT Time Calculation (min) 45 min    Activity Tolerance Patient tolerated treatment well    Behavior During Therapy WFL for tasks assessed/performed             Past Medical History:  Diagnosis Date   Allergy    Anxiety    Arthritis    Expressive aphasia    Hemiparesis ([place])    Right side   High triglycerides    History of kidney stones    Kidney stones    Recurrent sinus infections    Stroke [facility])     Past Surgical History:  Procedure Laterality Date   ANTERIOR CRUCIATE LIGAMENT REPAIR Left    BUNIONECTOMY Right    CYSTOSCOPY/URETEROSCOPY/HOLMIUM LASER/STENT PLACEMENT Right 02/08/2021   Procedure: CYSTOSCOPY/RETROGRADE/URETEROSCOPY/HOLMIUM LASER/STENT PLACEMENT, NEPHROSTOMY TUBE REMOVAL;  Surgeon: Ceasar Mons, MD;  Location: [facility];  Service: Urology;  Laterality: Right;   IR NEPHROSTOMY PLACEMENT RIGHT  12/09/2020   LASIK     LITHOTRIPSY     MENISCUS REPAIR Left    NASAL SEPTUM SURGERY     SHOULDER SURGERY     VARICOSE VEIN SURGERY Left     There were no vitals filed for this visit.   Subjective Assessment - 03/22/21 1421     Subjective Pt just finished with ST. Aide, Melanie Madden is with her.    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 No/denies                [facility] PT Assessment - 03/22/21 0001       6 Minute Walk- Baseline   6 Minute Walk- Baseline yes    BP (mmHg) 160/98    HR (bpm) 72    Modified Borg Scale for Dyspnea 0- Nothing at all      6 Minute walk- Post Test   6 Minute Walk Post Test yes    BP (mmHg) 158/98    HR (bpm) 78    Modified Borg Scale for Dyspnea 0- Nothing at all      6 minute walk test results    Aerobic Endurance Distance Walked 230    Endurance additional comments with quad cane                           [facility] Adult PT Treatment/Exercise -  03/22/21 0001       Transfers   Transfers Sit to Stand;Stand to Sit    Sit to Stand 5: Supervision;4: Min guard    Sit to Stand Details Tactile cues for weight shifting    Stand to Sit 4: Min guard      Ambulation/Gait   Ambulation/Gait Yes    Ambulation/Gait Assistance 5: Supervision    Ambulation/Gait Assistance Details during 6 min walk    Ambulation Distance (Feet) 230 Feet    Assistive device Small based quad cane   right AFO   Gait Pattern Step-to pattern;Decreased arm swing - right;Decreased stance time - right;Decreased hip/knee flexion - right;Decreased weight shift to right    Ambulation Surface Level;Indoor      Exercises   Exercises Other Exercises    Other Exercises  PT reviewed prior HEP with caregiver, Melanie Madden, observing as noted below. Pt also performed right clamshell in sidelying with only minimal motion and max assist to prevent rolling x 10.             Exercises Bridge with Arms at [employer] and Feet on Swiss Ball - 1 x daily - 7 x weekly - 2 sets - 10 reps  -verbal and tactile cues for form and  to increase right pelvic lift. Performed x 10. Supine Single Leg Hip and Knee Flexion ROM with Swiss Ball - 1 x daily - 7 x weekly - 2 sets - 10 reps  -performed x 10 with PT providing stabilization at leg and verbal and tactile cues for form. Hip Abduction and Adduction Caregiver PROM - 1 x daily - 7 x weekly - 2 sets - 10 reps  -performed x 10 with PT supporting under right heel to reduce friction and help with form. Sit to Stand - 1 x daily - 7 x weekly - 2 sets - 5 reps  -performed x 5 with PT helping to facilitate right weight shift. Cues to control descent. Side to Side Weight Shift with Counter Support - 1 x daily - 7 x weekly - 2 sets - 10 reps- discussed continuing at home Seated Pelvic Tilt on Balance Disk - 1 x daily - 7 x weekly - 3 sets - 10 reps  -performed on dynadisc x 10 with verbal cues for form Seated lateral pelvic tilt on pillow - 1 x daily - 7 x weekly - 3 sets - 10 reps  -performed on dynadisc x 10 with verbal cues for form      PT Education - 03/22/21 1850     Education Details Reviewed HEP and reprinted for patient. Discussed monitoring BP at home.    Person(s) Educated Patient;Caregiver(s)    Methods Explanation;Demonstration;Handout    Comprehension Verbalized understanding              PT Short Term Goals - 03/22/21 1850       PT SHORT TERM GOAL #1   Title Pt will perform 2/6 minute walk test of endurance with quad cane    Baseline 03/22/21  230' on 6 min walk    Time 4    Period Weeks    Status Achieved    Target Date 04/14/21      PT SHORT TERM GOAL #2   Title Pt will re-initiate updated HEP with supervision    Time 4    Period Weeks    Status New    Target Date 04/14/21      PT SHORT TERM GOAL #3  Title Pt will increase BERG by 4 points to indicate decreased falls risk    Baseline 21/56    Time 4    Period Weeks    Status New    Target Date 04/14/21      PT SHORT TERM GOAL #4   Title Pt will increase gait velocity to 0.25 m/sec  with LRAD and supervision    Baseline .18 m/sec with quad cane    Time 4    Period Weeks    Status New    Target Date 04/14/21               PT Long Term Goals - 03/22/21 1851       PT LONG TERM GOAL #1   Title Pt will be able to perform progressive HEP for strengthening, balance and mobility with assist of husband to continued gains at home.    Time 8    Period Weeks    Status New      PT LONG TERM GOAL #2   Title Pt will increase BERG balance to >/= 30/56 to indicate decreased falls risk    Baseline 21/56    Time 8    Period Weeks    Status New      PT LONG TERM GOAL #3   Title Pt will increase 6 min walk to >300' for improved mobility and activity tolerance.    Baseline 03/22/21 230'    Time 8    Period Weeks    Status Revised      PT LONG TERM GOAL #4   Title Pt will increase gait velocity to >/- 0.30 m/sec for improved household mobility with LRAD and supervision    Baseline .18 m/sec    Time 8    Period Weeks    Status New      PT LONG TERM GOAL #5   Title Pt will perform sit > stand MOD I with equal WB through R and LLE to indicate improved functional strength in RLE for transfers    Baseline weight shifted to LLE only    Time 8    Period Weeks    Status New                   Plan - 03/22/21 1852     Clinical Impression Statement Pt was able to complete 6 min walk today with no increase in BP but still running high. PT reviewed HEP from prior episode and reissued with caregiver observing.    Personal Factors and Comorbidities Behavior Pattern;Comorbidity 3+;Past/Current Experience;Time since onset of injury/illness/exacerbation    Comorbidities anxiety, migraines, HTN, OA, expressive aphasia, R hemiplegia, kidney stones, L ACL repair, R bunionectomy, L meniscus repair, shoulder surgery, stroke    Examination-Activity Limitations Transfers;Stairs;Locomotion Level;Stand;Bend    Examination-Participation Restrictions Community Activity     Stability/Clinical Decision Making Evolving/Moderate complexity    Rehab Potential Good    PT Frequency 2x / week    PT Duration 8 weeks    PT Treatment/Interventions ADLs/Self Care Home Management;DME Instruction;Therapeutic activities;Functional mobility training;Stair training;Gait training;Therapeutic exercise;Balance training;Neuromuscular re-education;Patient/family education;Manual techniques;Passive range of motion;Orthotic Fit/Training    PT Next Visit Plan Check BP-running high again. Notify MD is still running high. re-start BWS over treadmill with ~30# unweighted. Performed at 0.54mh last visit with medium harness. Continue to work on right anterior pelvic rotation as tends to be posteriorlly rotated to help initiate more hip flexion. Work on weight shifting to right in standing, functional  strengthening, gait training with SBQC. Continue tall kneeling activities.    PT Home Exercise Plan Access Code: [email or web address]  URL: [email or web address]    Consulted and Agree with Plan of Care Patient             Patient will benefit from skilled therapeutic intervention in order to improve the following deficits and impairments:  Abnormal gait, Decreased activity tolerance, Decreased balance, Decreased endurance, Decreased mobility, Decreased strength, Difficulty walking, Impaired sensation, Impaired tone, Postural dysfunction  Visit Diagnosis: Other abnormalities of gait and mobility  Muscle weakness (generalized)     Problem List Patient Active Problem List   Diagnosis Date Noted   Urinary retention    E. 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

## 2021-03-28 ENCOUNTER — Other Ambulatory Visit: Payer: Self-pay

## 2021-03-28 ENCOUNTER — Ambulatory Visit: Payer: Managed Care, Other (non HMO)

## 2021-03-28 DIAGNOSIS — I69153 Hemiplegia and hemiparesis following nontraumatic intracerebral hemorrhage affecting right non-dominant side: Secondary | ICD-10-CM

## 2021-03-28 DIAGNOSIS — R2681 Unsteadiness on feet: Secondary | ICD-10-CM

## 2021-03-28 DIAGNOSIS — I69151 Hemiplegia and hemiparesis following nontraumatic intracerebral hemorrhage affecting right dominant side: Secondary | ICD-10-CM

## 2021-03-28 DIAGNOSIS — M6281 Muscle weakness (generalized): Secondary | ICD-10-CM

## 2021-03-28 DIAGNOSIS — R2689 Other abnormalities of gait and mobility: Secondary | ICD-10-CM

## 2021-03-28 NOTE — Therapy (Signed)
[facility] [address] [place], [place], [address] Phone: [phone]   Fax:  [phone]  Physical Therapy Treatment  Patient Details  Name: Melanie Madden MRN: [id number] Date of Birth: 06/07/1966 Referring Provider (PT): Charlett Blake, MD   Encounter Date: 03/28/2021   PT End of Session - 03/28/21 1327     Visit Number 2   No Visit Charge   Number of Visits 17    Date for PT Re-Evaluation 05/14/21    Authorization Type [employer]; $50 copay for each discipline.  VL: 60, 8 used.  If she sees all three in same day, 3 visits used    Authorization - Visit Number 10    Authorization - Number of Visits 60    PT Start Time 1320    PT Stop Time 1340    PT Time Calculation (min) 20 min    Activity Tolerance Patient tolerated treatment well    Behavior During Therapy WFL for tasks assessed/performed             Past Medical History:  Diagnosis Date   Allergy    Anxiety    Arthritis    Expressive aphasia    Hemiparesis ([place])    Right side   High triglycerides    History of kidney stones    Kidney stones    Recurrent sinus infections    Stroke [facility])     Past Surgical History:  Procedure Laterality Date   ANTERIOR CRUCIATE LIGAMENT REPAIR Left    BUNIONECTOMY Right    CYSTOSCOPY/URETEROSCOPY/HOLMIUM LASER/STENT PLACEMENT Right 02/08/2021   Procedure: CYSTOSCOPY/RETROGRADE/URETEROSCOPY/HOLMIUM LASER/STENT PLACEMENT, NEPHROSTOMY TUBE REMOVAL;  Surgeon: Ceasar Mons, MD;  Location: [facility];  Service: Urology;  Laterality: Right;   IR NEPHROSTOMY PLACEMENT RIGHT  12/09/2020   LASIK     LITHOTRIPSY     MENISCUS REPAIR Left    NASAL SEPTUM SURGERY     SHOULDER SURGERY     VARICOSE VEIN SURGERY Left     There were no vitals filed for this visit.   Subjective Assessment - 03/28/21 1327     Subjective Pt present with caretaker. 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 Automatic BP: 150/108 Patient and caregiver both educated that BP is outside safe therapeutic range for exercises today. Pt expressed understanding. Pt's husband was notififed via call regarding session's finding and MD was notified via [employer] as well.                         PT Short Term Goals - 03/22/21 1850       PT SHORT TERM GOAL #1   Title Pt will perform 2/6 minute walk test of endurance with quad cane    Baseline 03/22/21  230' on 6 min walk    Time 4    Period Weeks    Status Achieved    Target Date 04/14/21      PT SHORT TERM  GOAL #2   Title Pt will re-initiate updated HEP with supervision    Time 4    Period Weeks    Status New    Target Date 04/14/21      PT SHORT TERM GOAL #3   Title Pt will increase BERG by 4 points to indicate decreased falls risk    Baseline 21/56    Time 4    Period Weeks    Status New    Target Date 04/14/21      PT SHORT TERM GOAL #4   Title Pt will increase gait velocity to 0.25 m/sec with LRAD and supervision    Baseline .18 m/sec with quad cane    Time 4    Period Weeks    Status New    Target Date 04/14/21               PT Long Term Goals - 03/22/21 1851       PT LONG TERM GOAL #1   Title Pt will be able to perform progressive HEP for strengthening, balance and mobility with assist of husband to continued gains at home.    Time 8    Period Weeks    Status New      PT LONG TERM GOAL #2   Title Pt will increase BERG balance to >/= 30/56 to indicate decreased falls risk     Baseline 21/56    Time 8    Period Weeks    Status New      PT LONG TERM GOAL #3   Title Pt will increase 6 min walk to >300' for improved mobility and activity tolerance.    Baseline 03/22/21 230'    Time 8    Period Weeks    Status Revised      PT LONG TERM GOAL #4   Title Pt will increase gait velocity to >/- 0.30 m/sec for improved household mobility with LRAD and supervision    Baseline .18 m/sec    Time 8    Period Weeks    Status New      PT LONG TERM GOAL #5   Title Pt will perform sit > stand MOD I with equal WB through R and LLE to indicate improved functional strength in RLE for transfers    Baseline weight shifted to LLE only    Time 8    Period Weeks    Status New                    Patient will benefit from skilled therapeutic intervention in order to improve the following deficits and impairments:     Visit Diagnosis: 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 [place], [place], [address] Phone: [phone]   Fax:  [phone]  Name: Melanie Madden MRN: [id number] Date of Birth: 01/30/1966

## 2021-03-30 ENCOUNTER — Ambulatory Visit: Payer: Managed Care, Other (non HMO)

## 2021-03-30 ENCOUNTER — Telehealth: Payer: Self-pay | Admitting: *Deleted

## 2021-03-30 ENCOUNTER — Other Ambulatory Visit: Payer: Self-pay

## 2021-03-30 VITALS — BP 150/95

## 2021-03-30 DIAGNOSIS — M6281 Muscle weakness (generalized): Secondary | ICD-10-CM | POA: Insufficient documentation

## 2021-03-30 DIAGNOSIS — I69151 Hemiplegia and hemiparesis following nontraumatic intracerebral hemorrhage affecting right dominant side: Secondary | ICD-10-CM | POA: Insufficient documentation

## 2021-03-30 DIAGNOSIS — R482 Apraxia: Secondary | ICD-10-CM | POA: Insufficient documentation

## 2021-03-30 DIAGNOSIS — R2689 Other abnormalities of gait and mobility: Secondary | ICD-10-CM | POA: Insufficient documentation

## 2021-03-30 DIAGNOSIS — R4701 Aphasia: Secondary | ICD-10-CM | POA: Insufficient documentation

## 2021-03-30 DIAGNOSIS — R293 Abnormal posture: Secondary | ICD-10-CM | POA: Insufficient documentation

## 2021-03-30 DIAGNOSIS — R2681 Unsteadiness on feet: Secondary | ICD-10-CM | POA: Insufficient documentation

## 2021-03-30 NOTE — Telephone Encounter (Signed)
Mr Joylene John called to report that Melanie Madden bp is running elevated.  Was 150/95 today at rehab. He was asking if Dr Letta Pate could see her today. UI have let him know that Dr Letta Pate will not be in the office until 04/04/21 and he doesn't manage bp issues or medications, and suggested he try the PCP or take her to urgent care if remains high.

## 2021-03-30 NOTE — Therapy (Signed)
[facility] [address] [place], [place], [address] Phone: [phone]   Fax:  [phone]  Physical Therapy Treatment- arrived no charge  Patient Details  Name: Melanie Madden MRN: [id number] Date of Birth: Dec 08, 1965 Referring Provider (PT): Charlett Blake, MD   Encounter Date: 03/30/2021   PT End of Session - 03/30/21 1324     Visit Number 2    Number of Visits 17    Date for PT Re-Evaluation 05/14/21    Authorization Type [employer]; $50 copay for each discipline.  VL: 60, 8 used.  If she sees all three in same day, 3 visits used    Authorization - Visit Number 10    Authorization - Number of Visits 44    PT Start Time 1315   arrived no charge   PT Stop Time 1350    PT Time Calculation (min) 35 min    Activity Tolerance Patient tolerated treatment well    Behavior During Therapy WFL for tasks assessed/performed             Past Medical History:  Diagnosis Date   Allergy    Anxiety    Arthritis    Expressive aphasia    Hemiparesis ([place])    Right side   High triglycerides    History of kidney stones    Kidney stones    Recurrent sinus infections    Stroke [facility])     Past Surgical History:  Procedure Laterality Date   ANTERIOR CRUCIATE LIGAMENT REPAIR Left    BUNIONECTOMY Right    CYSTOSCOPY/URETEROSCOPY/HOLMIUM LASER/STENT PLACEMENT Right 02/08/2021   Procedure: CYSTOSCOPY/RETROGRADE/URETEROSCOPY/HOLMIUM LASER/STENT PLACEMENT, NEPHROSTOMY TUBE REMOVAL;  Surgeon: Ceasar Mons, MD;  Location: [facility];  Service: Urology;  Laterality: Right;   IR NEPHROSTOMY PLACEMENT RIGHT  12/09/2020   LASIK     LITHOTRIPSY     MENISCUS REPAIR Left    NASAL SEPTUM SURGERY     SHOULDER SURGERY     VARICOSE VEIN SURGERY Left     Vitals:   03/30/21 1328 03/30/21 1356  BP: (!) 170/100 (!) 150/95     Subjective Assessment - 03/30/21 1325     Subjective Pt's husband has a call in to  Dr. Letta Pate about BP and trying to schedule a visit. He is also going to walk over to Dr. Clydene Fake office next door to see if possible to get in there. Pt and husband went over what she eats and nothing should be affecting BP.    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 No/denies                                         PT Short Term Goals - 03/22/21 1850       PT SHORT TERM GOAL #1   Title Pt will perform 2/6 minute walk test of endurance with quad cane    Baseline 03/22/21  230' on 6 min walk    Time 4    Period Weeks    Status Achieved    Target Date 04/14/21  PT SHORT TERM GOAL #2   Title Pt will re-initiate updated HEP with supervision    Time 4    Period Weeks    Status New    Target Date 04/14/21      PT SHORT TERM GOAL #3   Title Pt will increase BERG by 4 points to indicate decreased falls risk    Baseline 21/56    Time 4    Period Weeks    Status New    Target Date 04/14/21      PT SHORT TERM GOAL #4   Title Pt will increase gait velocity to 0.25 m/sec with LRAD and supervision    Baseline .18 m/sec with quad cane    Time 4    Period Weeks    Status New    Target Date 04/14/21               PT Long Term Goals - 03/22/21 1851       PT LONG TERM GOAL #1   Title Pt will be able to perform progressive HEP for strengthening, balance and mobility with assist of husband to continued gains at home.    Time 8    Period Weeks    Status New      PT LONG TERM GOAL #2   Title Pt will increase BERG balance to >/= 30/56 to indicate decreased falls risk    Baseline 21/56    Time 8    Period Weeks    Status New      PT  LONG TERM GOAL #3   Title Pt will increase 6 min walk to >300' for improved mobility and activity tolerance.    Baseline 03/22/21 230'    Time 8    Period Weeks    Status Revised      PT LONG TERM GOAL #4   Title Pt will increase gait velocity to >/- 0.30 m/sec for improved household mobility with LRAD and supervision    Baseline .18 m/sec    Time 8    Period Weeks    Status New      PT LONG TERM GOAL #5   Title Pt will perform sit > stand MOD I with equal WB through R and LLE to indicate improved functional strength in RLE for transfers    Baseline weight shifted to LLE only    Time 8    Period Weeks    Status New                   Plan - 03/30/21 1713     Clinical Impression Statement Session witheld due to elevated BP again. Husband, [name], was present today and trying to get her in with either Dr. Letta Pate or Dr. Leonie Man. Has call in and went next door to talk to neurologist and earliest appointment in December but will try to get in sooner. Also considering PCP but Su[place] has not seen them since CVA as refuses to go. Cola was displaying frustration today but PT did discuss why it was important to be seen to get BP addressed for her safety and to be able to participate with PT.    Personal Factors and Comorbidities Behavior Pattern;Comorbidity 3+;Past/Current Experience;Time since onset of injury/illness/exacerbation    Comorbidities anxiety, migraines, HTN, OA, expressive aphasia, R hemiplegia, kidney stones, L ACL repair, R bunionectomy, L meniscus repair, shoulder surgery, stroke    Examination-Activity Limitations Transfers;Stairs;Locomotion Level;Stand;Bend    Examination-Participation Restrictions Community Activity  Stability/Clinical Decision Making Evolving/Moderate complexity    Rehab Potential Good    PT Frequency 2x / week    PT Duration 8 weeks    PT Treatment/Interventions ADLs/Self Care Home Management;DME Instruction;Therapeutic activities;Functional  mobility training;Stair training;Gait training;Therapeutic exercise;Balance training;Neuromuscular re-education;Patient/family education;Manual techniques;Passive range of motion;Orthotic Fit/Training    PT Next Visit Plan Check BP-running high again. Did they get earlier visit with MD about BP?. re-start BWS over treadmill with ~30# unweighted. Performed at 0.72mh last visit with medium harness. Continue to work on right anterior pelvic rotation as tends to be posteriorlly rotated to help initiate more hip flexion. Work on weight shifting to right in standing, functional strengthening, gait training with SBQC. Continue tall kneeling activities.    PT Home Exercise Plan Access Code: [email or web address] URL: [email or web address]    Consulted and Agree with Plan of Care Patient             Patient will benefit from skilled therapeutic intervention in order to improve the following deficits and impairments:  Abnormal gait, Decreased activity tolerance, Decreased balance, Decreased endurance, Decreased mobility, Decreased strength, Difficulty walking, Impaired sensation, Impaired tone, Postural dysfunction  Visit Diagnosis: Other abnormalities of gait and mobility     Problem List Patient Active Problem List   Diagnosis Date Noted   Urinary retention    E. 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 [address]Phone: [phone]  Fax:  [phone] Name: NVictoriaann EichelbergerMRN: [id number] of Birth: 103/29/1967

## 2021-04-04 ENCOUNTER — Other Ambulatory Visit: Payer: Self-pay

## 2021-04-04 ENCOUNTER — Ambulatory Visit: Payer: Managed Care, Other (non HMO)

## 2021-04-04 VITALS — BP 152/88

## 2021-04-04 DIAGNOSIS — M6281 Muscle weakness (generalized): Secondary | ICD-10-CM

## 2021-04-04 DIAGNOSIS — R2689 Other abnormalities of gait and mobility: Secondary | ICD-10-CM

## 2021-04-04 DIAGNOSIS — R4701 Aphasia: Secondary | ICD-10-CM

## 2021-04-04 DIAGNOSIS — R2681 Unsteadiness on feet: Secondary | ICD-10-CM

## 2021-04-04 DIAGNOSIS — R269 Unspecified abnormalities of gait and mobility: Secondary | ICD-10-CM | POA: Diagnosis present

## 2021-04-04 DIAGNOSIS — I69398 Other sequelae of cerebral infarction: Secondary | ICD-10-CM | POA: Diagnosis present

## 2021-04-04 DIAGNOSIS — I69319 Unspecified symptoms and signs involving cognitive functions following cerebral infarction: Secondary | ICD-10-CM | POA: Diagnosis present

## 2021-04-04 DIAGNOSIS — R482 Apraxia: Secondary | ICD-10-CM

## 2021-04-04 DIAGNOSIS — I610 Nontraumatic intracerebral hemorrhage in hemisphere, subcortical: Secondary | ICD-10-CM | POA: Diagnosis present

## 2021-04-04 NOTE — Therapy (Signed)
[facility] [address] [place], [place], [address] Phone: [phone]   Fax:  [phone]  Speech Language Pathology Treatment  Patient Details  Name: Melanie Madden MRN: [id number] Date of Birth: 07-29-1966 Referring Provider (SLP): Charlett Blake, MD   Encounter Date: 04/04/2021   End of Session - 04/04/21 1620     Visit Number 3    Number of Visits 13    Date for SLP Re-Evaluation 04/28/21    Authorization Time Period 60 days combined    Authorization - Visit Number 2    Authorization - Number of Visits 10    SLP Start Time 1615    SLP Stop Time  1700    SLP Time Calculation (min) 45 min    Activity Tolerance Treatment limited secondary to agitation             Past Medical History:  Diagnosis Date   Allergy    Anxiety    Arthritis    Expressive aphasia    Hemiparesis ([place])    Right side   High triglycerides    History of kidney stones    Kidney stones    Recurrent sinus infections    Stroke [facility])     Past Surgical History:  Procedure Laterality Date   ANTERIOR CRUCIATE LIGAMENT REPAIR Left    BUNIONECTOMY Right    CYSTOSCOPY/URETEROSCOPY/HOLMIUM LASER/STENT PLACEMENT Right 02/08/2021   Procedure: CYSTOSCOPY/RETROGRADE/URETEROSCOPY/HOLMIUM LASER/STENT PLACEMENT, NEPHROSTOMY TUBE REMOVAL;  Surgeon: Ceasar Mons, MD;  [facility];  Service: Urology;  Laterality: Right;   IR NEPHROSTOMY PLACEMENT RIGHT  12/09/2020   LASIK     LITHOTRIPSY     MENISCUS REPAIR Left    NASAL SEPTUM SURGERY     SHOULDER SURGERY     VARICOSE VEIN SURGERY Left     There were no vitals filed for this visit.   Subjective Assessment - 04/04/21 1619     Subjective nodded head    Currently in Pain? No/denies                   ADULT SLP TREATMENT - 04/04/21 1620       General Information   Behavior/Cognition Alert;Cooperative;Pleasant mood;Impulsive;Agitated       Cognitive-Linquistic Treatment   Treatment focused on Aphasia;Apraxia    Skilled Treatment Pt's caregiver and husband interchangably present during [place] session. Inconsistent use of external communication aids reported. Pt noted with increased frustration related to communication breakdown with usual communication partners. SLP educated and instructed use of Lingraphica device and writing. Use of device was effective for 100% of attempts (ID food items in picture & explaination of when relative visited). Writing was occasionally effective as pt noted to initiate words x2 with listener providing guesses. Usual use of calming strategies and re-direction required to reduce frustration related to communication breakdown this session.      Assessment / Recommendations / Plan   Plan Continue with current plan of care      Progression Toward Goals   Progression toward goals Progressing toward goals              SLP Education - 04/04/21 1829     Education Details use and modify Lingraphica device    Person(s) Educated Patient;Caregiver(s)    Methods Explanation;Demonstration    Comprehension Verbalized understanding;Returned demonstration;Need further instruction              SLP Short Term Goals - 04/04/21 1831  SLP SHORT TERM GOAL #1   Title Pt will utilize external communication supports to augment verbal expression for 4/5 opportunities given occasional mod A over 2 sessions    Baseline 04-04-21    Time 4    Period Weeks    Status On-going    Target Date 04/14/21      SLP SHORT TERM GOAL #2   Title Pt will accurately select written prompt to communicate wants/needs with 75% accuracy given occasional mod A over 2 sessions    Time 4    Period Weeks    Status On-going    Target Date 04/14/21      SLP SHORT TERM GOAL #3   Title Pt will accurately approximate 1-2 phonemes of targeted icons/words on communications supports with 25% accuracy given consistent verbal modeling over  2 session    Time 4    Period Weeks    Status On-going    Target Date 04/14/21              SLP Long Term Goals - 04/04/21 1832       SLP LONG TERM GOAL #1   Title Pt will spontaneously utilize external communication supports to augment verbal expression for 4/5 opportunities over 3 sessions    Time 6    Period Weeks    Status On-going    Target Date 04/28/21      SLP LONG TERM GOAL #2   Title Pt will ID personally relevant objects in f:8 for communication of wants/needs with 75% accuracy given rare min A over 3 sessions    Time 6    Period Weeks    Status On-going    Target Date 04/28/21      SLP LONG TERM GOAL #3   Title Pt will use mulitmodal communication (gesture, draw, write 1st letter etc) to augment verbal expression to meet needs at home with rare min A from family or caregivers over 2 sessions    Time 6    Period Weeks    Status On-going    Target Date 04/28/21      SLP LONG TERM GOAL #4   Title Pt will accurately approximate 1-2 phonemes of targeted icons/words on communications supports with 50% accuracy given consistent verbal modeling over 2 session    Time 6    Period Weeks    Status On-going    Target Date 04/28/21              Plan - 04/04/21 1830     Clinical Impression Statement Leilia presents today for ST intervention to address 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 Given similiar baseline to last course of ST intervention and minimal progress with verbal expression since onset of stroke, SLP re-educated patient and caregiver on recommendation to primarily focus ST sessions on optimizing communication device and utilizing communication supports to aid verbal expression due to severity of aphasia and apraxia and resulting in frustration and communication breakdown. SLP provided occasional verbal and visual cues to utilize communication supports and writing to augment communication basic wants/needs and preferences. SLP  recommends skilled ST intervention to maximize current communication skills via communication supports to increase communication effectiveness as well as less directly target some speech related tasks as pt desires to speak.    Speech Therapy Frequency 2x / week    Duration Other (comment)   scheduled for 6 weeks to account for scheduling and missed appointments (anticipate d/c after 4 weeks)  Treatment/Interventions Oral motor exercises;Compensatory strategies;Cueing hierarchy;Functional tasks;Patient/family education;Cognitive reorganization;Multimodal communcation approach;Language facilitation;Compensatory techniques;Internal/external aids;SLP instruction and feedback    Potential to Achieve Goals Fair    Potential Considerations Previous level of function;Severity of impairments;Ability to learn/carryover information;Cooperation/participation level    SLP Home Exercise Plan provided    Consulted and Agree with Plan of Care Patient;Family member/caregiver             Patient will benefit from skilled therapeutic intervention in order to improve the following deficits and impairments:   Verbal apraxia  Aphasia    Problem List Patient Active Problem List   Diagnosis Date Noted   Urinary retention    E. 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 [place], [place], [address] Phone: [phone]   Fax:  [phone]   Name: Shelena Bettner MRN: [id number] Date of Birth: 10-Nov-1965

## 2021-04-04 NOTE — Therapy (Signed)
[facility] [address] [place], [place], [address] Phone: [phone]   Fax:  [phone]  Physical Therapy Treatment  Patient Details  Name: Melanie Madden MRN: [id number] Date of Birth: [birth year] Referring Provider (PT): Charlett Blake, MD   Encounter Date: 04/04/2021   PT End of Session - 04/04/21 [id number]     Visit Number 3    Number of Visits 17    Date for PT Re-Evaluation 05/14/21    Authorization Type [employer]; $50 copay for each discipline.  VL: 60, 8 used.  If she sees all three in same day, 3 visits used    Authorization - Visit Number 10    Authorization - Number of Visits 26    PT Start Time [id number]    PT Stop Time 1614    PT Time Calculation (min) 44 min    Activity Tolerance Patient tolerated treatment well    Behavior During Therapy WFL for tasks assessed/performed             Past Medical History:  Diagnosis Date   Allergy    Anxiety    Arthritis    Expressive aphasia    Hemiparesis ([place])    Right side   High triglycerides    History of kidney stones    Kidney stones    Recurrent sinus infections    Stroke [facility])     Past Surgical History:  Procedure Laterality Date   ANTERIOR CRUCIATE LIGAMENT REPAIR Left    BUNIONECTOMY Right    CYSTOSCOPY/URETEROSCOPY/HOLMIUM LASER/STENT PLACEMENT Right 02/08/2021   Procedure: CYSTOSCOPY/RETROGRADE/URETEROSCOPY/HOLMIUM LASER/STENT PLACEMENT, NEPHROSTOMY TUBE REMOVAL;  Surgeon: Ceasar Mons, MD;  Location: [facility];  Service: Urology;  Laterality: Right;   IR NEPHROSTOMY PLACEMENT RIGHT  12/09/2020   LASIK     LITHOTRIPSY     MENISCUS REPAIR Left    NASAL SEPTUM SURGERY     SHOULDER SURGERY     VARICOSE VEIN SURGERY Left     Vitals:   04/04/21 1533  BP: (!) 152/88     Subjective Assessment - 04/04/21 1532     Subjective Pt denies any issues over the weekend.    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 No/denies                               [facility] Adult PT Treatment/Exercise - 04/04/21 1535       Transfers   Transfers Sit to Stand;Stand to Sit    Sit to Stand 5: Supervision    Stand to Sit 5: Supervision      Ambulation/Gait   Ambulation/Gait Yes    Ambulation/Gait Assistance 5: Supervision    Ambulation/Gait Assistance Details Verbal cues to try to increase right weight shift and stand tall. Also cued to try to increase left step length past right foot.    Ambulation Distance (Feet) 230 Feet    Assistive device Small based quad cane   right AFO   Gait Pattern Step-through pattern;Step-to pattern;Decreased step length - left;Decreased stance time - right;Decreased hip/knee flexion - right;Decreased weight shift to right  Ambulation Surface Level;Indoor      Neuro Re-ed    Neuro Re-ed Details  Standing with tapping 4" step with LLE x 20 to faciliate right weight shift with PT providing tactile cues at right knee and pelvis. Kicking front of step with RLE and then back x 10. PT facilitating at pelvis. Stepping forward and back with RLE x 10. In // bars: stepping over and back 2x4" bolster with LLE to increase right weight shift x 10. PT provided tactile and visual cues from mirror to weight shift to right and help be sure right leg was stabilized. Gait forwards/backwards/sidestepping in // bars 8' x 4 each with LUE support and verbal cues for form. BP=142/88 after      Knee/Hip Exercises: Aerobic   Other Aerobic SciFit x 5 min level 3 with LUE and legs first 4 minutes then just legs last minute with PT stabilizing right leg to prevent hip ER                   Upper Extremity Functional Index Score :   /80     PT Short Term Goals - 03/22/21 1850       PT SHORT TERM GOAL #1   Title Pt will perform 2/6 minute walk test of endurance with quad cane    Baseline 03/22/21  230' on 6 min walk    Time 4    Period Weeks    Status Achieved    Target Date 04/14/21      PT SHORT TERM GOAL #2   Title Pt will re-initiate updated HEP with supervision    Time 4    Period Weeks    Status New    Target Date 04/14/21      PT SHORT TERM GOAL #3   Title Pt will increase BERG by 4 points to indicate decreased falls risk    Baseline 21/56    Time 4    Period Weeks    Status New    Target Date 04/14/21      PT SHORT TERM GOAL #4   Title Pt will increase gait velocity to 0.25 m/sec with LRAD and supervision    Baseline .18 m/sec with quad cane    Time 4    Period Weeks    Status New    Target Date 04/14/21               PT Long Term Goals - 03/22/21 1851       PT LONG TERM GOAL #1   Title Pt will be able to perform progressive HEP for strengthening, balance and mobility with assist of husband to continued gains at home.    Time 8    Period Weeks    Status New      PT LONG TERM GOAL #2   Title Pt will increase BERG balance to >/= 30/56 to indicate decreased falls risk    Baseline 21/56    Time 8    Period Weeks    Status New      PT LONG TERM GOAL #3   Title Pt will increase 6 min walk to >300' for improved mobility and activity tolerance.    Baseline 03/22/21 230'    Time 8    Period Weeks    Status Revised      PT LONG TERM GOAL #4   Title Pt will increase gait velocity to >/- 0.30 m/sec for improved household mobility with LRAD and supervision  Baseline .18 m/sec    Time 8    Period Weeks    Status New      PT LONG TERM GOAL #5   Title Pt will perform sit > stand MOD I with equal WB through R and LLE to indicate improved functional strength in RLE for transfers    Baseline weight shifted to LLE only     Time 8    Period Weeks    Status New                   Plan - 04/04/21 1940     Clinical Impression Statement Pt's BP was better than last week but continued to monitor closely throughout session. PT focused on improving right weight shift during session.    Personal Factors and Comorbidities Behavior Pattern;Comorbidity 3+;Past/Current Experience;Time since onset of injury/illness/exacerbation    Comorbidities anxiety, migraines, HTN, OA, expressive aphasia, R hemiplegia, kidney stones, L ACL repair, R bunionectomy, L meniscus repair, shoulder surgery, stroke    Examination-Activity Limitations Transfers;Stairs;Locomotion Level;Stand;Bend    Examination-Participation Restrictions Community Activity    Stability/Clinical Decision Making Evolving/Moderate complexity    Rehab Potential Good    PT Frequency 2x / week    PT Duration 8 weeks    PT Treatment/Interventions ADLs/Self Care Home Management;DME Instruction;Therapeutic activities;Functional mobility training;Stair training;Gait training;Therapeutic exercise;Balance training;Neuromuscular re-education;Patient/family education;Manual techniques;Passive range of motion;Orthotic Fit/Training    PT Next Visit Plan Monitor BP closesly, re-start BWS over treadmill with ~30# unweighted. Performed at 0.34mh last visit with medium harness. Continue to work on right anterior pelvic rotation as tends to be posteriorlly rotated to help initiate more hip flexion. Work on weight shifting to right in standing, functional strengthening, gait training with SBQC. Continue tall kneeling activities.    PT Home Exercise Plan Access Code: [email or web address] URL: [email or web address]    Consulted and Agree with Plan of Care Patient             Patient will benefit from skilled therapeutic intervention in order to improve the following deficits and impairments:  Abnormal gait, Decreased activity tolerance, Decreased balance, Decreased  endurance, Decreased mobility, Decreased strength, Difficulty walking, Impaired sensation, Impaired tone, Postural dysfunction  Visit Diagnosis: Other abnormalities of gait and mobility  Muscle weakness (generalized)  Unsteadiness on feet     Problem List Patient Active Problem List   Diagnosis Date Noted   Urinary retention    E. 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

## 2021-04-06 ENCOUNTER — Ambulatory Visit: Payer: Managed Care, Other (non HMO)

## 2021-04-06 ENCOUNTER — Other Ambulatory Visit: Payer: Self-pay

## 2021-04-06 VITALS — BP 150/88

## 2021-04-06 DIAGNOSIS — I610 Nontraumatic intracerebral hemorrhage in hemisphere, subcortical: Secondary | ICD-10-CM | POA: Diagnosis not present

## 2021-04-06 DIAGNOSIS — R4701 Aphasia: Secondary | ICD-10-CM

## 2021-04-06 DIAGNOSIS — R2689 Other abnormalities of gait and mobility: Secondary | ICD-10-CM

## 2021-04-06 DIAGNOSIS — R482 Apraxia: Secondary | ICD-10-CM

## 2021-04-06 DIAGNOSIS — M6281 Muscle weakness (generalized): Secondary | ICD-10-CM

## 2021-04-06 NOTE — Therapy (Signed)
[facility] [address] [place], [place], [address] Phone: [phone]   Fax:  [phone]  Physical Therapy Treatment  Patient Details  Name: Melanie Madden MRN: [id number] Date of Birth: April 10, 1966 Referring Provider (PT): Charlett Blake, MD   Encounter Date: 04/06/2021   PT End of Session - 04/06/21 [id number]     Visit Number 4    Number of Visits 17    Date for PT Re-Evaluation 05/14/21    Authorization Type [employer]; $50 copay for each discipline.  VL: 60, 8 used.  If she sees all three in same day, 3 visits used    Authorization - Visit Number 10    Authorization - Number of Visits 14    PT Start Time 1535    PT Stop Time 1615    PT Time Calculation (min) 40 min    Activity Tolerance Patient tolerated treatment well    Behavior During Therapy WFL for tasks assessed/performed             Past Medical History:  Diagnosis Date   Allergy    Anxiety    Arthritis    Expressive aphasia    Hemiparesis ([place])    Right side   High triglycerides    History of kidney stones    Kidney stones    Recurrent sinus infections    Stroke [facility])     Past Surgical History:  Procedure Laterality Date   ANTERIOR CRUCIATE LIGAMENT REPAIR Left    BUNIONECTOMY Right    CYSTOSCOPY/URETEROSCOPY/HOLMIUM LASER/STENT PLACEMENT Right 02/08/2021   Procedure: CYSTOSCOPY/RETROGRADE/URETEROSCOPY/HOLMIUM LASER/STENT PLACEMENT, NEPHROSTOMY TUBE REMOVAL;  Surgeon: Ceasar Mons, MD;  Location: [facility];  Service: Urology;  Laterality: Right;   IR NEPHROSTOMY PLACEMENT RIGHT  12/09/2020   LASIK     LITHOTRIPSY     MENISCUS REPAIR Left    NASAL SEPTUM SURGERY     SHOULDER SURGERY     VARICOSE VEIN SURGERY Left     Vitals:   04/06/21 1545  BP: (!) 150/88     Subjective Assessment - 04/06/21 1542     Subjective Pt denies any new issues.    Pertinent History anxiety, migraines, HTN, OA, expressive aphasia,  R hemiplegia, kidney stones, L ACL repair, R bunionectomy, L meniscus repair, shoulder surgery, left basal ganglia ICH 06/2019    Limitations Standing;Walking    Patient Stated Goals Unable to verbalize, pointed to her leg, indicated she wanted to get stronger    Currently in Pain? No/denies                               [facility] Adult PT Treatment/Exercise - 04/06/21 1543       Ambulation/Gait   Ambulation/Gait Yes    Ambulation/Gait Assistance 5: Supervision    Ambulation/Gait Assistance Details Pt ambulated overground at end of session with cues to try to increase left step length.    Ambulation Distance (Feet) 75 Feet    Assistive device Small based quad cane   right AFO   Gait Pattern Step-through pattern;Decreased stance time - right;Decreased step length - left;Decreased hip/knee flexion - right;Decreased weight shift to right    Ambulation Surface Level;Indoor    Gait Comments BWS over treadmill x 6 min at 0.51mh. Pt was unweighted ~30#. Pt was given verbal cues to increase left step length by slowing it down to increase right stance time. PT assisted  at right leg to get right hip/knee flexion. BP=158/96 after.      Neuro Re-ed    Neuro Re-ed Details  Sitting on blue physioball working on trunk control trying to maintain upright posture with brief episodes of no UE support with PT helping to stabilize ball next to mat. Tactile cues for upright posture. Pt losing balance to left.                       PT Short Term Goals - 03/22/21 1850       PT SHORT TERM GOAL #1   Title Pt will perform 2/6 minute walk test of endurance with quad cane    Baseline 03/22/21  230' on 6 min walk    Time 4    Period Weeks    Status Achieved    Target Date 04/14/21      PT SHORT TERM GOAL #2   Title Pt will re-initiate updated HEP with supervision    Time 4    Period Weeks    Status New    Target Date 04/14/21      PT SHORT TERM GOAL #3   Title Pt will  increase BERG by 4 points to indicate decreased falls risk    Baseline 21/56    Time 4    Period Weeks    Status New    Target Date 04/14/21      PT SHORT TERM GOAL #4   Title Pt will increase gait velocity to 0.25 m/sec with LRAD and supervision    Baseline .18 m/sec with quad cane    Time 4    Period Weeks    Status New    Target Date 04/14/21               PT Long Term Goals - 03/22/21 1851       PT LONG TERM GOAL #1   Title Pt will be able to perform progressive HEP for strengthening, balance and mobility with assist of husband to continued gains at home.    Time 8    Period Weeks    Status New      PT LONG TERM GOAL #2   Title Pt will increase BERG balance to >/= 30/56 to indicate decreased falls risk    Baseline 21/56    Time 8    Period Weeks    Status New      PT LONG TERM GOAL #3   Title Pt will increase 6 min walk to >300' for improved mobility and activity tolerance.    Baseline 03/22/21 230'    Time 8    Period Weeks    Status Revised      PT LONG TERM GOAL #4   Title Pt will increase gait velocity to >/- 0.30 m/sec for improved household mobility with LRAD and supervision    Baseline .18 m/sec    Time 8    Period Weeks    Status New      PT LONG TERM GOAL #5   Title Pt will perform sit > stand MOD I with equal WB through R and LLE to indicate improved functional strength in RLE for transfers    Baseline weight shifted to LLE only    Time 8    Period Weeks    Status New                   Plan - 04/06/21 1848  Clinical Impression Statement Pt was able to restart gait in BWS over treadmill today. Focused on increasing left step length. BP did rise some but not a lot from where is started.    Personal Factors and Comorbidities Behavior Pattern;Comorbidity 3+;Past/Current Experience;Time since onset of injury/illness/exacerbation    Comorbidities anxiety, migraines, HTN, OA, expressive aphasia, R hemiplegia, kidney stones, L ACL  repair, R bunionectomy, L meniscus repair, shoulder surgery, stroke    Examination-Activity Limitations Transfers;Stairs;Locomotion Level;Stand;Bend    Examination-Participation Restrictions Community Activity    Stability/Clinical Decision Making Evolving/Moderate complexity    Rehab Potential Good    PT Frequency 2x / week    PT Duration 8 weeks    PT Treatment/Interventions ADLs/Self Care Home Management;DME Instruction;Therapeutic activities;Functional mobility training;Stair training;Gait training;Therapeutic exercise;Balance training;Neuromuscular re-education;Patient/family education;Manual techniques;Passive range of motion;Orthotic Fit/Training    PT Next Visit Plan Monitor BP closesly, re-start BWS over treadmill with ~30# unweighted. Performed at 0.35mh last visit with medium harness. Continue to work on right anterior pelvic rotation as tends to be posteriorly rotated to help initiate more hip flexion. Continue work on bMariettaworking on trunk control. Work on weight shifting to right in standing, functional strengthening, gait training with SBQC. Continue tall kneeling activities.    PT Home Exercise Plan Access Code: [email or web address] URL: [email or web address]    Consulted and Agree with Plan of Care Patient             Patient will benefit from skilled therapeutic intervention in order to improve the following deficits and impairments:  Abnormal gait, Decreased activity tolerance, Decreased balance, Decreased endurance, Decreased mobility, Decreased strength, Difficulty walking, Impaired sensation, Impaired tone, Postural dysfunction  Visit Diagnosis: Other abnormalities of gait and mobility  Muscle weakness (generalized)     Problem List Patient Active Problem List   Diagnosis Date Noted   Urinary retention    E. 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

## 2021-04-07 NOTE — Therapy (Signed)
[facility] [address] [place], [place], [address] Phone: [phone]   Fax:  [phone]  Speech Language Pathology Treatment  Patient Details  Name: Melanie Madden MRN: [id number] Date of Birth: [birth year] Referring Provider (SLP): Charlett Blake, MD   Encounter Date: 04/06/2021   End of Session - 04/06/21 1548     Visit Number 4    Number of Visits 13    Date for SLP Re-Evaluation 04/28/21    Authorization Time Period 60 days combined    Authorization - Visit Number 3    Authorization - Number of Visits 10    SLP Start Time [id number]    SLP Stop Time  1700    SLP Time Calculation (min) 45 min    Activity Tolerance Patient tolerated treatment well             Past Medical History:  Diagnosis Date   Allergy    Anxiety    Arthritis    Expressive aphasia    Hemiparesis ([place])    Right side   High triglycerides    History of kidney stones    Kidney stones    Recurrent sinus infections    Stroke [facility])     Past Surgical History:  Procedure Laterality Date   ANTERIOR CRUCIATE LIGAMENT REPAIR Left    BUNIONECTOMY Right    CYSTOSCOPY/URETEROSCOPY/HOLMIUM LASER/STENT PLACEMENT Right 02/08/2021   Procedure: CYSTOSCOPY/RETROGRADE/URETEROSCOPY/HOLMIUM LASER/STENT PLACEMENT, NEPHROSTOMY TUBE REMOVAL;  Surgeon: Ceasar Mons, MD;  Location: [facility];  Service: Urology;  Laterality: Right;   IR NEPHROSTOMY PLACEMENT RIGHT  12/09/2020   LASIK     LITHOTRIPSY     MENISCUS REPAIR Left    NASAL SEPTUM SURGERY     SHOULDER SURGERY     VARICOSE VEIN SURGERY Left     There were no vitals filed for this visit.   Subjective Assessment - 04/06/21 1614     Subjective nodded head    Currently in Pain? No/denies                   ADULT SLP TREATMENT - 04/06/21 1547       General Information   Behavior/Cognition Alert;Cooperative;Pleasant mood;Impulsive       Cognitive-Linquistic Treatment   Treatment focused on Aphasia;Apraxia    Skilled Treatment PT reported she used writing to aid communication in session with good effectiveness. Pt's husband present this session. SLP re-educated both pt and husband on recommendation to utilize external communication aids to optimize communication and reduce frustration. SLP introduced and instructed the typing and drawing sections within [place]. Pt able to type name without cues; however, all other naming tasks required usual letter cues to spell. For writing tasks, usual letter peseverations exhibited impacting effectiveness. Drawing was more effective to aid communication. SLP emphasized continuing to trial these methods of multimodal communication. SLP modified hobby category, in which pt effecitvely used pictures to aid SLP comprehension with verbal expression supplemented by pt's husband.      Assessment / Recommendations / Plan   Plan Continue with current plan of care      Progression Toward Goals   Progression toward goals Progressing toward goals              SLP Education - 04/07/21 0754     Education Details typing/drawing within [place], trial a hobby at home to discuss next session    Person(s) Educated Patient;Spouse    Methods Explanation;Demonstration;Verbal cues  Comprehension Verbalized understanding;Returned demonstration;Verbal cues required;Need further instruction              SLP Short Term Goals - 04/06/21 1548       SLP SHORT TERM GOAL #1   Title Pt will utilize external communication supports to augment verbal expression for 4/5 opportunities given occasional mod A over 2 sessions    Baseline 04-04-21    Time 4    Period Weeks    Status On-going    Target Date 04/14/21      SLP SHORT TERM GOAL #2   Title Pt will accurately select written prompt to communicate wants/needs with 75% accuracy given occasional mod A over 2 sessions    Time 4    Period Weeks     Status On-going    Target Date 04/14/21      SLP SHORT TERM GOAL #3   Title Pt will accurately approximate 1-2 phonemes of targeted icons/words on communications supports with 25% accuracy given consistent verbal modeling over 2 session    Time 4    Period Weeks    Status On-going    Target Date 04/14/21              SLP Long Term Goals - 04/06/21 1549       SLP LONG TERM GOAL #1   Title Pt will spontaneously utilize external communication supports to augment verbal expression for 4/5 opportunities over 3 sessions    Time 6    Period Weeks    Status On-going    Target Date 04/28/21      SLP LONG TERM GOAL #2   Title Pt will ID personally relevant objects in f:8 for communication of wants/needs with 75% accuracy given rare min A over 3 sessions    Time 6    Period Weeks    Status On-going    Target Date 04/28/21      SLP LONG TERM GOAL #3   Title Pt will use mulitmodal communication (gesture, draw, write 1st letter etc) to augment verbal expression to meet needs at home with rare min A from family or caregivers over 2 sessions    Time 6    Period Weeks    Status On-going    Target Date 04/28/21      SLP LONG TERM GOAL #4   Title Pt will accurately approximate 1-2 phonemes of targeted icons/words on communications supports with 50% accuracy given consistent verbal modeling over 2 session    Time 6    Period Weeks    Status On-going    Target Date 04/28/21              Plan - 04/06/21 1548     Clinical Impression Statement Lauryn presents today for ST intervention to address 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 Given minimal progress with verbal expression since onset of stroke, SLP re-educated patient and caregiver on recommendation to primarily focus ST sessions on optimizing communication device and utilizing communication supports to aid verbal expression due to severity of aphasia and apraxia which results in frustration. SLP provided  education and instruction of how to use multimodal elements of device as communication supports and writing to augment communication of basic wants/needs and preferences. SLP recommends skilled ST intervention to maximize current communication skills via communication supports to increase communication effectiveness as well as less directly target some speech related tasks as pt desires to speak.    Speech Therapy Frequency 2x /  week    Duration Other (comment)   6 weeks   Treatment/Interventions Oral motor exercises;Compensatory strategies;Cueing hierarchy;Functional tasks;Patient/family education;Cognitive reorganization;Multimodal communcation approach;Language facilitation;Compensatory techniques;Internal/external aids;SLP instruction and feedback    Potential to Achieve Goals Fair    Potential Considerations Previous level of function;Severity of impairments;Ability to learn/carryover information;Cooperation/participation level    SLP Home Exercise Plan provided    Consulted and Agree with Plan of Care Patient;Family member/caregiver             Patient will benefit from skilled therapeutic intervention in order to improve the following deficits and impairments:   Aphasia  Verbal apraxia    Problem List Patient Active Problem List   Diagnosis Date Noted   Urinary retention    E. 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 [place], [place], [address] Phone: [phone]   Fax:  [phone]   Name: Kailee Garno MRN: [id number] Date of Birth: [birth year]

## 2021-04-11 ENCOUNTER — Ambulatory Visit: Payer: Managed Care, Other (non HMO)

## 2021-04-11 ENCOUNTER — Other Ambulatory Visit: Payer: Self-pay

## 2021-04-11 VITALS — BP 140/88

## 2021-04-11 DIAGNOSIS — M6281 Muscle weakness (generalized): Secondary | ICD-10-CM

## 2021-04-11 DIAGNOSIS — I69151 Hemiplegia and hemiparesis following nontraumatic intracerebral hemorrhage affecting right dominant side: Secondary | ICD-10-CM

## 2021-04-11 DIAGNOSIS — R4701 Aphasia: Secondary | ICD-10-CM

## 2021-04-11 DIAGNOSIS — I610 Nontraumatic intracerebral hemorrhage in hemisphere, subcortical: Secondary | ICD-10-CM | POA: Diagnosis not present

## 2021-04-11 DIAGNOSIS — R2689 Other abnormalities of gait and mobility: Secondary | ICD-10-CM

## 2021-04-11 DIAGNOSIS — R482 Apraxia: Secondary | ICD-10-CM

## 2021-04-11 NOTE — Therapy (Signed)
[facility] [address] [place], [place], [address] Phone: [phone]   Fax:  [phone]  Speech Language Pathology Treatment  Patient Details  Name: Melanie Madden MRN: [id number] Date of Birth: Apr 05, 1966 Referring Provider (SLP): Charlett Blake, MD   Encounter Date: 04/11/2021   End of Session - 04/11/21 1320     Visit Number 5    Number of Visits 13    Date for SLP Re-Evaluation 04/28/21    Authorization Time Period 60 days combined    Authorization - Visit Number 4    Authorization - Number of Visits 10    SLP Start Time 1316    SLP Stop Time  1400    SLP Time Calculation (min) 44 min    Activity Tolerance Patient tolerated treatment well             Past Medical History:  Diagnosis Date   Allergy    Anxiety    Arthritis    Expressive aphasia    Hemiparesis ([place])    Right side   High triglycerides    History of kidney stones    Kidney stones    Recurrent sinus infections    Stroke [facility])     Past Surgical History:  Procedure Laterality Date   ANTERIOR CRUCIATE LIGAMENT REPAIR Left    BUNIONECTOMY Right    CYSTOSCOPY/URETEROSCOPY/HOLMIUM LASER/STENT PLACEMENT Right 02/08/2021   Procedure: CYSTOSCOPY/RETROGRADE/URETEROSCOPY/HOLMIUM LASER/STENT PLACEMENT, NEPHROSTOMY TUBE REMOVAL;  Surgeon: Ceasar Mons, MD;  Location: [facility];  Service: Urology;  Laterality: Right;   IR NEPHROSTOMY PLACEMENT RIGHT  12/09/2020   LASIK     LITHOTRIPSY     MENISCUS REPAIR Left    NASAL SEPTUM SURGERY     SHOULDER SURGERY     VARICOSE VEIN SURGERY Left     There were no vitals filed for this visit.   Subjective Assessment - 04/11/21 1319     Subjective "eh"    Currently in Pain? No/denies                   ADULT SLP TREATMENT - 04/11/21 1319       General Information   Behavior/Cognition Alert;Cooperative;Pleasant mood;Impulsive      Cognitive-Linquistic  Treatment   Treatment focused on Aphasia;Apraxia    Skilled Treatment Pt was unaccompanied this session. Pt gestured "so-so" for use of [employer] device. SLP reviewed recent modifications on device from last ST session. Pt successfully used writing on [employer] to communication biographical/personal information with 55% accuracy with intermittent cues to redirect to using device. Approximations of first 1-2 letters were effective for 2/3 words. SLP targeted naming common objects, in which pt able to verbally approximate words given usual SLP modeling with ~25% accuracy. Pt able to write single words with 50% independently (2/4 words) given self-correction x1. Pt benefitted from dictation and occasional mod A to correct perseverations.      Assessment / Recommendations / Plan   Plan Continue with current plan of care      Progression Toward Goals   Progression toward goals Progressing toward goals              SLP Education - 04/11/21 1425     Education Details writing on [employer] during PT session    Person(s) Educated Patient    Methods Explanation;Demonstration;Verbal cues    Comprehension Verbalized understanding;Returned demonstration;Verbal cues required;Need further instruction  SLP Short Term Goals - 04/11/21 1427       SLP SHORT TERM GOAL #1   Title Pt will utilize external communication supports to augment verbal expression for 4/5 opportunities given occasional mod A over 2 sessions    Baseline 04-04-21, 04-11-21    Status Achieved    Target Date 04/14/21      SLP SHORT TERM GOAL #2   Title Pt will accurately select written prompt to communicate wants/needs with 75% accuracy given occasional mod A over 2 sessions    Time 4    Period Weeks    Status On-going    Target Date 04/14/21      SLP SHORT TERM GOAL #3   Title Pt will accurately approximate 1-2 phonemes of targeted icons/words on communications supports with 25% accuracy given consistent  verbal modeling over 2 session    Baseline 04-11-21    Time 4    Period Weeks    Status On-going    Target Date 04/14/21              SLP Long Term Goals - 04/11/21 1427       SLP LONG TERM GOAL #1   Title Pt will spontaneously utilize external communication supports to augment verbal expression for 4/5 opportunities over 3 sessions    Time 6    Period Weeks    Status On-going    Target Date 04/28/21      SLP LONG TERM GOAL #2   Title Pt will ID personally relevant objects in f:8 for communication of wants/needs with 75% accuracy given rare min A over 3 sessions    Time 6    Period Weeks    Status On-going    Target Date 04/28/21      SLP LONG TERM GOAL #3   Title Pt will use mulitmodal communication (gesture, draw, write 1st letter etc) to augment verbal expression to meet needs at home with rare min A from family or caregivers over 2 sessions    Time 6    Period Weeks    Status On-going    Target Date 04/28/21      SLP LONG TERM GOAL #4   Title Pt will accurately approximate 1-2 phonemes of targeted icons/words on communications supports with 50% accuracy given consistent verbal modeling over 2 session    Time 6    Period Weeks    Status On-going    Target Date 04/28/21              Plan - 04/11/21 1426     Clinical Impression Statement Yolander presents today for ST intervention to address 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 Given minimal progress with verbal expression since onset of stroke, SLP re-educated patient on recommendation to primarily focus ST sessions on optimizing communication device and utilizing communication supports to aid verbal expression due to severity of aphasia and apraxia which results in frustration. SLP provided education and instruction of how to use multimodal elements of device as communication supports and writing to augment communication of basic wants/needs and preferences. Writing was usually effective  this session to augment verbal expression. SLP recommends skilled ST intervention to maximize current communication skills via communication supports to increase communication effectiveness as well as less directly target some speech related tasks as pt desires to speak.    Speech Therapy Frequency 2x / week    Duration Other (comment)   6 weeks   Treatment/Interventions Oral motor  exercises;Compensatory strategies;Cueing hierarchy;Functional tasks;Patient/family education;Cognitive reorganization;Multimodal communcation approach;Language facilitation;Compensatory techniques;Internal/external aids;SLP instruction and feedback    Potential to Achieve Goals Fair    Potential Considerations Previous level of function;Severity of impairments;Ability to learn/carryover information;Cooperation/participation level    SLP Home Exercise Plan provided    Consulted and Agree with Plan of Care Patient             Patient will benefit from skilled therapeutic intervention in order to improve the following deficits and impairments:   Aphasia  Verbal apraxia    Problem List Patient Active Problem List   Diagnosis Date Noted   Urinary retention    E. 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 [place], [place], [address] Phone: [phone]   Fax:  [phone]   Name: Jaylei Ensz MRN: [id number] Date of Birth: 20-Feb-1966

## 2021-04-11 NOTE — Therapy (Signed)
[facility] [address] [place], [place], [address] Phone: [phone]   Fax:  [phone]  Physical Therapy Treatment  Patient Details  Name: Melanie Madden MRN: [id number] Date of Birth: [birth year] Referring Provider (PT): Charlett Blake, MD   Encounter Date: 04/11/2021   PT End of Session - 04/11/21 1404     Visit Number 5    Number of Visits 17    Date for PT Re-Evaluation 05/14/21    Authorization Type [employer]; $50 copay for each discipline.  VL: 60, 8 used.  If she sees all three in same day, 3 visits used    Authorization - Visit Number 10    Authorization - Number of Visits 76    PT Start Time 1402    PT Stop Time 1445    PT Time Calculation (min) 43 min    Activity Tolerance Patient tolerated treatment well    Behavior During Therapy WFL for tasks assessed/performed             Past Medical History:  Diagnosis Date   Allergy    Anxiety    Arthritis    Expressive aphasia    Hemiparesis ([place])    Right side   High triglycerides    History of kidney stones    Kidney stones    Recurrent sinus infections    Stroke [facility])     Past Surgical History:  Procedure Laterality Date   ANTERIOR CRUCIATE LIGAMENT REPAIR Left    BUNIONECTOMY Right    CYSTOSCOPY/URETEROSCOPY/HOLMIUM LASER/STENT PLACEMENT Right 02/08/2021   Procedure: CYSTOSCOPY/RETROGRADE/URETEROSCOPY/HOLMIUM LASER/STENT PLACEMENT, NEPHROSTOMY TUBE REMOVAL;  Surgeon: Ceasar Mons, MD;  Location: [facility];  Service: Urology;  Laterality: Right;   IR NEPHROSTOMY PLACEMENT RIGHT  12/09/2020   LASIK     LITHOTRIPSY     MENISCUS REPAIR Left    NASAL SEPTUM SURGERY     SHOULDER SURGERY     VARICOSE VEIN SURGERY Left     Vitals:   04/11/21 1405  BP: 140/88     Subjective Assessment - 04/11/21 1404     Subjective Pt denies any new issues. Just finished ST and they are using the draw feature on the lingraphica.     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 No/denies                               [facility] Adult PT Treatment/Exercise - 04/11/21 1408       Ambulation/Gait   Ambulation/Gait Yes    Ambulation/Gait Assistance 5: Supervision    Ambulation/Gait Assistance Details Ambulated after BWS in treadmill. Verbal cues to increase left step length.    Ambulation Distance (Feet) 115 Feet    Assistive device Small based quad cane   right AFO   Gait Pattern Step-through pattern;Decreased stance time - right;Decreased hip/knee flexion - right;Decreased weight shift to right    Ambulation Surface Level;Indoor    Gait Comments BWS over treadmill x 6 min at 0.74mh. Pt was unweighted ~30#. Pt was given verbal cues to increase left step length by slowing it down to increase right  stance time. PT assisted at right leg to get right hip/knee flexion. Pt reported feeling tired after. BP=150/88 after.      Neuro Re-ed    Neuro Re-ed Details  Sitting on blue physioball with PT helping to stabilize ball and pt using LUE support at times as needed trying to maintain upright posture for brief periods with no UE support. Then added in reaching with LUE in varied directions min assist to stabilize 20 sec x 2. Pelvic tilts on physioball ant/post x 10 and then lateral x 10 min assist to stabilize ball.                       PT Short Term Goals - 03/22/21 1850       PT SHORT TERM GOAL #1   Title Pt will perform 2/6 minute walk test of endurance with quad cane    Baseline 03/22/21  230' on 6 min walk    Time 4    Period Weeks    Status Achieved    Target Date 04/14/21      PT SHORT  TERM GOAL #2   Title Pt will re-initiate updated HEP with supervision    Time 4    Period Weeks    Status New    Target Date 04/14/21      PT SHORT TERM GOAL #3   Title Pt will increase BERG by 4 points to indicate decreased falls risk    Baseline 21/56    Time 4    Period Weeks    Status New    Target Date 04/14/21      PT SHORT TERM GOAL #4   Title Pt will increase gait velocity to 0.25 m/sec with LRAD and supervision    Baseline .18 m/sec with quad cane    Time 4    Period Weeks    Status New    Target Date 04/14/21               PT Long Term Goals - 03/22/21 1851       PT LONG TERM GOAL #1   Title Pt will be able to perform progressive HEP for strengthening, balance and mobility with assist of husband to continued gains at home.    Time 8    Period Weeks    Status New      PT LONG TERM GOAL #2   Title Pt will increase BERG balance to >/= 30/56 to indicate decreased falls risk    Baseline 21/56    Time 8    Period Weeks    Status New      PT LONG TERM GOAL #3   Title Pt will increase 6 min walk to >300' for improved mobility and activity tolerance.    Baseline 03/22/21 230'    Time 8    Period Weeks    Status Revised      PT LONG TERM GOAL #4   Title Pt will increase gait velocity to >/- 0.30 m/sec for improved household mobility with LRAD and supervision    Baseline .18 m/sec    Time 8    Period Weeks    Status New      PT LONG TERM GOAL #5   Title Pt will perform sit > stand MOD I with equal WB through R and LLE to indicate improved functional strength in RLE for transfers    Baseline weight shifted to LLE only    Time  8    Period Weeks    Status New                   Plan - 04/11/21 1838     Clinical Impression Statement PT continued to work on increasing left step length to increase right stance time with BWS over treadmill followed by overground walking. Pt was able to demonstrate some carryover.    Personal Factors and  Comorbidities Behavior Pattern;Comorbidity 3+;Past/Current Experience;Time since onset of injury/illness/exacerbation    Comorbidities anxiety, migraines, HTN, OA, expressive aphasia, R hemiplegia, kidney stones, L ACL repair, R bunionectomy, L meniscus repair, shoulder surgery, stroke    Examination-Activity Limitations Transfers;Stairs;Locomotion Level;Stand;Bend    Examination-Participation Restrictions Community Activity    Stability/Clinical Decision Making Evolving/Moderate complexity    Rehab Potential Good    PT Frequency 2x / week    PT Duration 8 weeks    PT Treatment/Interventions ADLs/Self Care Home Management;DME Instruction;Therapeutic activities;Functional mobility training;Stair training;Gait training;Therapeutic exercise;Balance training;Neuromuscular re-education;Patient/family education;Manual techniques;Passive range of motion;Orthotic Fit/Training    PT Next Visit Plan Check STGs. Monitor BP closesly, Continue BWS over treadmill with ~30# unweighted. Performed at 0.62mh last visit with medium harness. Continue to work on right anterior pelvic rotation as tends to be posteriorly rotated to help initiate more hip flexion. Continue work on b[place]ak Viewworking on trunk control. Work on weight shifting to right in standing, functional strengthening, gait training with SBQC. Try tall kneeling activities.    PT Home Exercise Plan Access Code: [email or web address] URL: [email or web address]    Consulted and Agree with Plan of Care Patient             Patient will benefit from skilled therapeutic intervention in order to improve the following deficits and impairments:  Abnormal gait, Decreased activity tolerance, Decreased balance, Decreased endurance, Decreased mobility, Decreased strength, Difficulty walking, Impaired sensation, Impaired tone, Postural dysfunction  Visit Diagnosis: Other abnormalities of gait and mobility  Muscle weakness (generalized)  Hemiplegia and  hemiparesis following nontraumatic intracerebral hemorrhage affecting right dominant side ([place]     Problem List Patient Active Problem List   Diagnosis Date Noted   Urinary retention    E. 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   Nontraumatic subcortical hemorrhage of left cerebral hemisphere ([place] 08/27/2019   Acute blood loss anemia    Hypoalbuminemia due to protein-calorie malnutrition ([facility])    Thrombocytopenia ([facility])    Dysphagia 07/29/2019   Infarction of left basal ganglia (H[facility]) 07/18/2019   Hypernatremia    Leukocytosis    Essential hypertension    Global aphasia    Cytotoxic brain edema ([place] 07/10/2019   ICH (intracerebral hemorrhage) ([place] 07/09/2019   Anxiety state 02/21/2015   Cephalalgia 12/21/2014    EElecta Sniff PT, DPT, NCS 04/11/2021, 6:40 PM  [facility]SM[place]llanville[place] [address]Phone: [phone]  Fax:  [phone] Name: Melanie PieczynskiMRN: [id number] of Birth: [birth year]

## 2021-04-13 ENCOUNTER — Ambulatory Visit: Payer: Managed Care, Other (non HMO)

## 2021-04-14 ENCOUNTER — Ambulatory Visit: Payer: Managed Care, Other (non HMO)

## 2021-04-14 ENCOUNTER — Other Ambulatory Visit: Payer: Self-pay

## 2021-04-14 DIAGNOSIS — R482 Apraxia: Secondary | ICD-10-CM

## 2021-04-14 DIAGNOSIS — R4701 Aphasia: Secondary | ICD-10-CM

## 2021-04-14 DIAGNOSIS — R2689 Other abnormalities of gait and mobility: Secondary | ICD-10-CM

## 2021-04-14 DIAGNOSIS — I69151 Hemiplegia and hemiparesis following nontraumatic intracerebral hemorrhage affecting right dominant side: Secondary | ICD-10-CM

## 2021-04-14 DIAGNOSIS — I610 Nontraumatic intracerebral hemorrhage in hemisphere, subcortical: Secondary | ICD-10-CM | POA: Diagnosis not present

## 2021-04-14 NOTE — Therapy (Signed)
[facility] [address] [place], [place], [address] Phone: [phone]   Fax:  [phone]  Speech Language Pathology Treatment  Patient Details  Name: Melanie Madden MRN: [id number] Date of Birth: 11-29-1965 Referring Provider (SLP): Charlett Blake, MD   Encounter Date: 04/14/2021   End of Session - 04/14/21 1328     Visit Number 6    Number of Visits 13    Date for SLP Re-Evaluation 04/28/21    Authorization Time Period 60 days combined    Authorization - Visit Number 5    Authorization - Number of Visits 10    SLP Start Time 0102    SLP Stop Time  1440    SLP Time Calculation (min) 45 min    Activity Tolerance Patient tolerated treatment well             Past Medical History:  Diagnosis Date   Allergy    Anxiety    Arthritis    Expressive aphasia    Hemiparesis ([place])    Right side   High triglycerides    History of kidney stones    Kidney stones    Recurrent sinus infections    Stroke [facility])     Past Surgical History:  Procedure Laterality Date   ANTERIOR CRUCIATE LIGAMENT REPAIR Left    BUNIONECTOMY Right    CYSTOSCOPY/URETEROSCOPY/HOLMIUM LASER/STENT PLACEMENT Right 02/08/2021   Procedure: CYSTOSCOPY/RETROGRADE/URETEROSCOPY/HOLMIUM LASER/STENT PLACEMENT, NEPHROSTOMY TUBE REMOVAL;  Surgeon: Ceasar Mons, MD;  [facility];  Service: Urology;  Laterality: Right;   IR NEPHROSTOMY PLACEMENT RIGHT  12/09/2020   LASIK     LITHOTRIPSY     MENISCUS REPAIR Left    NASAL SEPTUM SURGERY     SHOULDER SURGERY     VARICOSE VEIN SURGERY Left     There were no vitals filed for this visit.   Subjective Assessment - 04/14/21 1525     Subjective pt brought articulation cards out of bag    Currently in Pain? No/denies                   ADULT SLP TREATMENT - 04/14/21 1328       General Information   Behavior/Cognition Alert;Cooperative;Pleasant  mood;Impulsive      Cognitive-Linquistic Treatment   Treatment focused on Aphasia;Apraxia    Skilled Treatment Pt provided newly bought decks of articulation cards related to alphabet letters/words, phonetic cards, and sight words. Per patient request, SLP reviewed different categories. SLP had patient read/say the word first and then provided cues as needed. For word and picture corresponding with alphabet letter, pt named 2/26 independently. With mod to max SLP modeling, pt able to approximate 15 words. For sight words, pt verbally named 1/50 independently. With mod to max SLP modeling, pt able to name 22 words for 1 repetition and approximated 12 words (mostly accurate ending sounds). SLP further assessed reading comprehension, in which pt silently read 1-step directions and performed with 49% independently, which improved to 64% accuracy with min to mod cues.      Assessment / Recommendations / Plan   Plan Continue with current plan of care      Progression Toward Goals   Progression toward goals Progressing toward goals                SLP Short Term Goals - 04/14/21 1329       SLP SHORT TERM GOAL #1   Title Pt will  utilize external communication supports to augment verbal expression for 4/5 opportunities given occasional mod A over 2 sessions    Baseline 04-04-21, 04-11-21    Status Achieved    Target Date 04/14/21      SLP SHORT TERM GOAL #2   Title Pt will accurately select written prompt to communicate wants/needs with 75% accuracy given occasional mod A over 2 sessions    Status Not Met    Target Date 04/14/21      SLP SHORT TERM GOAL #3   Title Pt will accurately approximate 1-2 phonemes of targeted icons/words on communications supports with 25% accuracy given consistent verbal modeling over 2 session    Baseline 04-11-21, 04-14-21    Status Achieved    Target Date 04/14/21              SLP Long Term Goals - 04/14/21 1329       SLP LONG TERM GOAL #1   Title Pt  will spontaneously utilize external communication supports to augment verbal expression for 4/5 opportunities over 3 sessions    Time 6    Period Weeks    Status On-going    Target Date 04/28/21      SLP LONG TERM GOAL #2   Title Pt will ID personally relevant objects in f:8 for communication of wants/needs with 75% accuracy given rare min A over 3 sessions    Time 6    Period Weeks    Status On-going    Target Date 04/28/21      SLP LONG TERM GOAL #3   Title Pt will use mulitmodal communication (gesture, draw, write 1st letter etc) to augment verbal expression to meet needs at home with rare min A from family or caregivers over 2 sessions    Time 6    Period Weeks    Status On-going    Target Date 04/28/21      SLP LONG TERM GOAL #4   Title Pt will accurately approximate 1-2 phonemes of targeted icons/words on communications supports with 50% accuracy given consistent verbal modeling over 2 session    Time 6    Period Weeks    Status On-going    Target Date 04/28/21              Plan - 04/14/21 1328     Clinical Impression Statement Chara presents today for ST intervention to address 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 Pt requested to work with personal decks of articulation cards. Rare accurate independent verbal productions noted, with some correct production and approximations with mod to max SLP verbal and visual modeling. SLP assessed reading comprehension of 1-step written commands, in which pt was 49% accurate independently. SLP again re-iterated use of external communication aids to augment verbal expression due to severity of apraxia and aphasia. SLP recommends skilled ST intervention to maximize current communication skills via communication supports to increase communication effectiveness as well as less directly target some speech related tasks as pt desires to speak.    Speech Therapy Frequency 2x / week    Duration Other (comment)   6  weeks   Treatment/Interventions Oral motor exercises;Compensatory strategies;Cueing hierarchy;Functional tasks;Patient/family education;Cognitive reorganization;Multimodal communcation approach;Language facilitation;Compensatory techniques;Internal/external aids;SLP instruction and feedback    Potential to Achieve Goals Fair    Potential Considerations Previous level of function;Severity of impairments;Ability to learn/carryover information;Cooperation/participation level    SLP Home Exercise Plan provided    Consulted and Agree with Plan of Care  Patient             Patient will benefit from skilled therapeutic intervention in order to improve the following deficits and impairments:   Verbal apraxia  Aphasia    Problem List Patient Active Problem List   Diagnosis Date Noted   Urinary retention    E. 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   Nontraumatic subcortical hemorrhage of left cerebral hemisphere ([place]) 08/27/2019   Acute blood loss anemia    Hypoalbuminemia due to protein-calorie malnutrition ([facility])    Thrombocytopenia ([facility])    Dysphagia 07/29/2019   Infarction of left basal ganglia ([facility]) 07/18/2019   Hypernatremia    Leukocytosis    Essential hypertension    Global aphasia    Cytotoxic brain edema ([place]) 07/10/2019   ICH (intracerebral hemorrhage) ([place]) 07/09/2019   Anxiety state 02/21/2015   Cephalalgia 12/21/2014    Alinda Deem, MA CCC-SLP 04/14/2021, 3:25 PM  [facility] [address] [place], [place], [address] Phone: [phone]   Fax:  [phone]   Name: Zyan Mirkin MRN: [id number] Date of Birth: 05/13/1966

## 2021-04-15 NOTE — Therapy (Signed)
[facility] [address] [place], [place], [address] Phone: [phone]   Fax:  [phone]  Physical Therapy Treatment  Patient Details  Name: Melanie Madden MRN: [id number] Date of Birth: 07-Apr-1966 Referring Provider (PT): Charlett Blake, MD   Encounter Date: 04/14/2021   PT End of Session - 04/14/21 1321     Visit Number 6    Number of Visits 17    Date for PT Re-Evaluation 05/14/21    Authorization Type [employer]; $50 copay for each discipline.  VL: 60, 8 used.  If she sees all three in same day, 3 visits used    Authorization - Visit Number 10    Authorization - Number of Visits 76    PT Start Time 1318    PT Stop Time 1356    PT Time Calculation (min) 38 min    Activity Tolerance Patient tolerated treatment well    Behavior During Therapy WFL for tasks assessed/performed             Past Medical History:  Diagnosis Date   Allergy    Anxiety    Arthritis    Expressive aphasia    Hemiparesis ([place])    Right side   High triglycerides    History of kidney stones    Kidney stones    Recurrent sinus infections    Stroke [facility])     Past Surgical History:  Procedure Laterality Date   ANTERIOR CRUCIATE LIGAMENT REPAIR Left    BUNIONECTOMY Right    CYSTOSCOPY/URETEROSCOPY/HOLMIUM LASER/STENT PLACEMENT Right 02/08/2021   Procedure: CYSTOSCOPY/RETROGRADE/URETEROSCOPY/HOLMIUM LASER/STENT PLACEMENT, NEPHROSTOMY TUBE REMOVAL;  Surgeon: Ceasar Mons, MD;  Location: [facility];  Service: Urology;  Laterality: Right;   IR NEPHROSTOMY PLACEMENT RIGHT  12/09/2020   LASIK     LITHOTRIPSY     MENISCUS REPAIR Left    NASAL SEPTUM SURGERY     SHOULDER SURGERY     VARICOSE VEIN SURGERY Left     There were no vitals filed for this visit.   Subjective Assessment - 04/14/21 1321     Subjective Pt denies any new issues.    Pertinent History anxiety, migraines, HTN, OA, expressive aphasia,  R hemiplegia, kidney stones, L ACL repair, R bunionectomy, L meniscus repair, shoulder surgery, left basal ganglia ICH 06/2019    Limitations Standing;Walking    Patient Stated Goals Unable to verbalize, pointed to her leg, indicated she wanted to get stronger    Currently in Pain? No/denies                               [facility] Adult PT Treatment/Exercise - 04/14/21 1321       Ambulation/Gait   Ambulation/Gait Yes    Ambulation/Gait Assistance 5: Supervision    Ambulation/Gait Assistance Details Pt was cued to try to increase left step length. BP=132/92 after walking and balance activities    Ambulation Distance (Feet) 115 Feet    Assistive device Small based quad cane   right AFO   Gait Pattern Step-through pattern;Decreased stance time - right;Decreased step length - right;Decreased hip/knee flexion - right;Right circumduction    Ambulation Surface Level;Indoor    Gait velocity 20' in 24.58 sec=0.57ms    Stairs Yes    Stairs Assistance 5: Supervision;4: Min guard    Stair Management Technique Step to pattern;One rail Left    Number of Stairs 4  Height of Stairs 6      Standardized Balance Assessment   Standardized Balance Assessment Berg Balance Test      Berg Balance Test   Sit to Stand Able to stand  independently using hands    Standing Unsupported Able to stand safely 2 minutes    Sitting with Back Unsupported but Feet Supported on Floor or Stool Able to sit safely and securely 2 minutes    Stand to Sit Sits safely with minimal use of hands    Transfers Needs one person to assist    Standing Unsupported with Eyes Closed Able to stand 10 seconds safely    Standing Ubsupported with Feet Together Needs help to attain position but able to stand for 30 seconds with feet together    From Standing, Reach Forward with Outstretched Arm Can reach forward >12 cm safely (5")    From Standing Position, Pick up Object from Floor Able to pick up shoe, needs  supervision    From Standing Position, Turn to Look Behind Over each Shoulder Turn sideways only but maintains balance    Turn 360 Degrees Needs assistance while turning    Standing Unsupported, Alternately Place Feet on Step/Stool Needs assistance to keep from falling or unable to try    Standing Unsupported, One Foot in Front Needs help to step but can hold 15 seconds    Standing on One Leg Unable to try or needs assist to prevent fall    Total Score 30      Neuro Re-ed    Neuro Re-ed Details  Tall kneeling on mat with platform in front for UE support: working on weight shifting to the left x 10 with tactile and verbal cues and then brief periods with raising LUE off for about 5 sec with cues for erect posture CGA/min assist then left shoulder flexion x 10. Pt preventing right posterior pelvic rotation. Standing in // bars tapping 4" step with LLE to facilitate right weight shift with PT stabilizing with verbal cues to stay up tall and using mirror for visual cues. Standing without UE support with raising left arm over head x 10                       PT Short Term Goals - 04/15/21 1548       PT SHORT TERM GOAL #1   Title Pt will perform 2/6 minute walk test of endurance with quad cane    Baseline 03/22/21  230' on 6 min walk    Time 4    Period Weeks    Status Achieved    Target Date 04/14/21      PT SHORT TERM GOAL #2   Title Pt will re-initiate updated HEP with supervision    Baseline Pt has been instucted in HEP with caregiver assist.    Time 4    Period Weeks    Status Achieved    Target Date 04/14/21      PT SHORT TERM GOAL #3   Title Pt will increase BERG by 4 points to indicate decreased falls risk    Baseline 21/56. 04/14/21 30/56    Time 4    Period Weeks    Status Achieved    Target Date 04/14/21      PT SHORT TERM GOAL #4   Title Pt will increase gait velocity to 0.25 m/sec with LRAD and supervision    Baseline .18 m/sec with quad cane. 04/14/21  0.56ms  Time 4    Period Weeks    Status Partially Met    Target Date 04/14/21               PT Long Term Goals - 03/22/21 1851       PT LONG TERM GOAL #1   Title Pt will be able to perform progressive HEP for strengthening, balance and mobility with assist of husband to continued gains at home.    Time 8    Period Weeks    Status New      PT LONG TERM GOAL #2   Title Pt will increase BERG balance to >/= 30/56 to indicate decreased falls risk    Baseline 21/56    Time 8    Period Weeks    Status New      PT LONG TERM GOAL #3   Title Pt will increase 6 min walk to >300' for improved mobility and activity tolerance.    Baseline 03/22/21 230'    Time 8    Period Weeks    Status Revised      PT LONG TERM GOAL #4   Title Pt will increase gait velocity to >/- 0.30 m/sec for improved household mobility with LRAD and supervision    Baseline .18 m/sec    Time 8    Period Weeks    Status New      PT LONG TERM GOAL #5   Title Pt will perform sit > stand MOD I with equal WB through R and LLE to indicate improved functional strength in RLE for transfers    Baseline weight shifted to LLE only    Time 8    Period Weeks    Status New                   Plan - 04/15/21 1549     Clinical Impression Statement PT assessed STGs today with pt meeting them for the most part. She increased Berg score to 30/56 meeting goal showing improving balance but still fall risk. She increased gait speed to 0.78ms which was just short of goal. Still indicates decreased safety with household mobility. Pt will conitnued to benefit from skilled PT to continue to progress towards remaining goals.    Personal Factors and Comorbidities Behavior Pattern;Comorbidity 3+;Past/Current Experience;Time since onset of injury/illness/exacerbation    Comorbidities anxiety, migraines, HTN, OA, expressive aphasia, R hemiplegia, kidney stones, L ACL repair, R bunionectomy, L meniscus repair, shoulder  surgery, stroke    Examination-Activity Limitations Transfers;Stairs;Locomotion Level;Stand;Bend    Examination-Participation Restrictions Community Activity    Stability/Clinical Decision Making Evolving/Moderate complexity    Rehab Potential Good    PT Frequency 2x / week    PT Duration 8 weeks    PT Treatment/Interventions ADLs/Self Care Home Management;DME Instruction;Therapeutic activities;Functional mobility training;Stair training;Gait training;Therapeutic exercise;Balance training;Neuromuscular re-education;Patient/family education;Manual techniques;Passive range of motion;Orthotic Fit/Training    PT Next Visit Plan Monitor BP closesly, Continue BWS over treadmill with ~30# unweighted. Performed at 0.677m last visit with medium harness. Continue to work on right anterior pelvic rotation as tends to be posteriorly rotated to help initiate more hip flexion. Continue work on [place] on trunk control. Work on weight shifting to right in standing, functional strengthening, gait training with SBQC. Continue tall kneeling activities.    PT Home Exercise Plan Access Code: [email or web address]: [email or web address]    Consulted and Agree with Plan of Care Patient  Patient will benefit from skilled therapeutic intervention in order to improve the following deficits and impairments:  Abnormal gait, Decreased activity tolerance, Decreased balance, Decreased endurance, Decreased mobility, Decreased strength, Difficulty walking, Impaired sensation, Impaired tone, Postural dysfunction  Visit Diagnosis: Other abnormalities of gait and mobility  Hemiplegia and hemiparesis following nontraumatic intracerebral hemorrhage affecting right dominant side [facility])     Problem List Patient Active Problem List   Diagnosis Date Noted   Urinary retention    E. 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

## 2021-04-18 ENCOUNTER — Ambulatory Visit: Payer: Managed Care, Other (non HMO)

## 2021-04-18 ENCOUNTER — Other Ambulatory Visit: Payer: Self-pay

## 2021-04-18 VITALS — BP 142/88

## 2021-04-18 DIAGNOSIS — M6281 Muscle weakness (generalized): Secondary | ICD-10-CM

## 2021-04-18 DIAGNOSIS — I69151 Hemiplegia and hemiparesis following nontraumatic intracerebral hemorrhage affecting right dominant side: Secondary | ICD-10-CM

## 2021-04-18 DIAGNOSIS — R4701 Aphasia: Secondary | ICD-10-CM

## 2021-04-18 DIAGNOSIS — R482 Apraxia: Secondary | ICD-10-CM

## 2021-04-18 DIAGNOSIS — R2689 Other abnormalities of gait and mobility: Secondary | ICD-10-CM

## 2021-04-18 DIAGNOSIS — I610 Nontraumatic intracerebral hemorrhage in hemisphere, subcortical: Secondary | ICD-10-CM | POA: Diagnosis not present

## 2021-04-18 NOTE — Therapy (Signed)
[facility] [address] [place], [place], [address] Phone: [phone]   Fax:  [phone]  Physical Therapy Treatment  Patient Details  Name: Melanie Madden MRN: [id number] Date of Birth: [birth year] Referring Provider (PT): Charlett Blake, MD   Encounter Date: 04/18/2021   PT End of Session - 04/18/21 1408     Visit Number 7    Number of Visits 17    Date for PT Re-Evaluation 05/14/21    Authorization Type [employer]; $50 copay for each discipline.  VL: 60, 8 used.  If she sees all three in same day, 3 visits used    Authorization - Visit Number 10    Authorization - Number of Visits 11    PT Start Time 6160    PT Stop Time 1445    PT Time Calculation (min) 40 min    Activity Tolerance Patient tolerated treatment well    Behavior During Therapy WFL for tasks assessed/performed             Past Medical History:  Diagnosis Date   Allergy    Anxiety    Arthritis    Expressive aphasia    Hemiparesis ([place])    Right side   High triglycerides    History of kidney stones    Kidney stones    Recurrent sinus infections    Stroke [facility])     Past Surgical History:  Procedure Laterality Date   ANTERIOR CRUCIATE LIGAMENT REPAIR Left    BUNIONECTOMY Right    CYSTOSCOPY/URETEROSCOPY/HOLMIUM LASER/STENT PLACEMENT Right 02/08/2021   Procedure: CYSTOSCOPY/RETROGRADE/URETEROSCOPY/HOLMIUM LASER/STENT PLACEMENT, NEPHROSTOMY TUBE REMOVAL;  Surgeon: Ceasar Mons, MD;  Location: [facility];  Service: Urology;  Laterality: Right;   IR NEPHROSTOMY PLACEMENT RIGHT  12/09/2020   LASIK     LITHOTRIPSY     MENISCUS REPAIR Left    NASAL SEPTUM SURGERY     SHOULDER SURGERY     VARICOSE VEIN SURGERY Left     Vitals:   04/18/21 1408 04/18/21 1411  BP: (!) 152/98 (!) 142/88     Subjective Assessment - 04/18/21 1408     Subjective Just finished ST.    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 No/denies                               [facility] Adult PT Treatment/Exercise - 04/18/21 1417       Ambulation/Gait   Ambulation/Gait Yes    Ambulation/Gait Assistance 5: Supervision    Ambulation/Gait Assistance Details Pt was cued to try to increase left step length and stand tall.    Ambulation Distance (Feet) 75 Feet    Assistive device Small based quad cane   right AFO   Gait Pattern Step-to pattern;Step-through pattern;Decreased stance time - right;Decreased step length - left;Right circumduction;Decreased hip/knee flexion - right    Ambulation Surface Level;Indoor      Neuro Re-ed    Neuro Re-ed Details  Gait weaving in and out of 4 cones and then over blue mat and red mat x 2 bouts. CGA on blue mat. Pt did bump cone  with right leg at times due to circumduction. Supine on mat: right hip flexion/extension with leg on red physioball with PT stabilizing to keep in place and verbal and tactile cues to pull up and then push down with some resistance to faciliate into extension 10 x 2, bridging over red physioball x 10 with PT stabilizing right leg. Seated on green dynadisc:working on core activation with upright posture with tactile cues and PT stabilizing right leg minimally, reaching overhead x 10 with LUE, maintaining upright posture with small manual pertubations x 1 min. Pt lost balance a few times to the right with pertubation in that direction. Performed with hands in lap. Seated on dynadisc with reaching across body to right with LUE for 3 cones and moving them to other side x 3 bouts with moving cones further away each time CGA. Sit to stand from dynadisc x 10 with PT  stabilizing at right knee to try to facilitate more right weight shift.                       PT Short Term Goals - 04/15/21 1548       PT SHORT TERM GOAL #1   Title Pt will perform 2/6 minute walk test of endurance with quad cane    Baseline 03/22/21  230' on 6 min walk    Time 4    Period Weeks    Status Achieved    Target Date 04/14/21      PT SHORT TERM GOAL #2   Title Pt will re-initiate updated HEP with supervision    Baseline Pt has been instucted in HEP with caregiver assist.    Time 4    Period Weeks    Status Achieved    Target Date 04/14/21      PT SHORT TERM GOAL #3   Title Pt will increase BERG by 4 points to indicate decreased falls risk    Baseline 21/56. 04/14/21 30/56    Time 4    Period Weeks    Status Achieved    Target Date 04/14/21      PT SHORT TERM GOAL #4   Title Pt will increase gait velocity to 0.25 m/sec with LRAD and supervision    Baseline .18 m/sec with quad cane. 04/14/21 0.3ms    Time 4    Period Weeks    Status Partially Met    Target Date 04/14/21               PT Long Term Goals - 03/22/21 1851       PT LONG TERM GOAL #1   Title Pt will be able to perform progressive HEP for strengthening, balance and mobility with assist of husband to continued gains at home.    Time 8    Period Weeks    Status New      PT LONG TERM GOAL #2   Title Pt will increase BERG balance to >/= 30/56 to indicate decreased falls risk    Baseline 21/56    Time 8    Period Weeks    Status New      PT LONG TERM GOAL #3   Title Pt will increase 6 min walk to >300' for improved mobility and activity tolerance.    Baseline 03/22/21 230'    Time 8    Period Weeks    Status Revised      PT LONG TERM GOAL #4   Title Pt will increase  gait velocity to >/- 0.30 m/sec for improved household mobility with LRAD and supervision    Baseline .18 m/sec    Time 8    Period Weeks    Status New      PT LONG TERM GOAL #5   Title Pt will perform  sit > stand MOD I with equal WB through R and LLE to indicate improved functional strength in RLE for transfers    Baseline weight shifted to LLE only    Time 8    Period Weeks    Status New                   Plan - 04/18/21 1500     Clinical Impression Statement Pt was feeling more tired today and declined to try treadmill. Worked on overground walking with trying to negotiate around obstacles. Then focused on RLE strengthening and core stabilization activities. Pt was able to maintain more upright posture on dynadisc compared to physioball. Loses balance to the right when she does.    Personal Factors and Comorbidities Behavior Pattern;Comorbidity 3+;Past/Current Experience;Time since onset of injury/illness/exacerbation    Comorbidities anxiety, migraines, HTN, OA, expressive aphasia, R hemiplegia, kidney stones, L ACL repair, R bunionectomy, L meniscus repair, shoulder surgery, stroke    Examination-Activity Limitations Transfers;Stairs;Locomotion Level;Stand;Bend    Examination-Participation Restrictions Community Activity    Stability/Clinical Decision Making Evolving/Moderate complexity    Rehab Potential Good    PT Frequency 2x / week    PT Duration 8 weeks    PT Treatment/Interventions ADLs/Self Care Home Management;DME Instruction;Therapeutic activities;Functional mobility training;Stair training;Gait training;Therapeutic exercise;Balance training;Neuromuscular re-education;Patient/family education;Manual techniques;Passive range of motion;Orthotic Fit/Training    PT Next Visit Plan Monitor BP closesly, Continue BWS over treadmill with ~30# unweighted. Performed at 0.2mh last visit with medium harness. Continue to work on right anterior pelvic rotation as tends to be posteriorly rotated to help initiate more hip flexion. Continue work on [place]orking on trunk control. Work on weight shifting to right in standing, functional strengthening, gait training with SBQC.  Continue tall kneeling activities.    PT Home Exercise Plan Access Code: [email or web address] URL: [email or web address]    Consulted and Agree with Plan of Care Patient             Patient will benefit from skilled therapeutic intervention in order to improve the following deficits and impairments:  Abnormal gait, Decreased activity tolerance, Decreased balance, Decreased endurance, Decreased mobility, Decreased strength, Difficulty walking, Impaired sensation, Impaired tone, Postural dysfunction  Visit Diagnosis: Other abnormalities of gait and mobility  Muscle weakness (generalized)  Hemiplegia and hemiparesis following nontraumatic intracerebral hemorrhage affecting right dominant side ([place]     Problem List Patient Active Problem List   Diagnosis Date Noted   Urinary retention    E. 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 [place], [place], [address] Phone: [phone]   Fax:  [phone]  Name: Melanie Madden MRN: [id number] Date of Birth: 1966-04-12

## 2021-04-18 NOTE — Therapy (Signed)
[facility] [address] [place], [place], [address] Phone: [phone]   Fax:  [phone]  Speech Language Pathology Treatment  Patient Details  Name: Melanie Madden MRN: [id number] Date of Birth: 08/28/1965 Referring Provider (SLP): Charlett Blake, MD   Encounter Date: 04/18/2021   End of Session - 04/18/21 1340     Visit Number 7    Number of Visits 13    Date for SLP Re-Evaluation 04/28/21    Authorization Time Period 60 days combined    Authorization - Visit Number 6    Authorization - Number of Visits 10    SLP Start Time 1316    SLP Stop Time  1400    SLP Time Calculation (min) 44 min    Activity Tolerance Patient tolerated treatment well             Past Medical History:  Diagnosis Date   Allergy    Anxiety    Arthritis    Expressive aphasia    Hemiparesis ([place])    Right side   High triglycerides    History of kidney stones    Kidney stones    Recurrent sinus infections    Stroke [facility])     Past Surgical History:  Procedure Laterality Date   ANTERIOR CRUCIATE LIGAMENT REPAIR Left    BUNIONECTOMY Right    CYSTOSCOPY/URETEROSCOPY/HOLMIUM LASER/STENT PLACEMENT Right 02/08/2021   Procedure: CYSTOSCOPY/RETROGRADE/URETEROSCOPY/HOLMIUM LASER/STENT PLACEMENT, NEPHROSTOMY TUBE REMOVAL;  Surgeon: Ceasar Mons, MD;  Location: [facility];  Service: Urology;  Laterality: Right;   IR NEPHROSTOMY PLACEMENT RIGHT  12/09/2020   LASIK     LITHOTRIPSY     MENISCUS REPAIR Left    NASAL SEPTUM SURGERY     SHOULDER SURGERY     VARICOSE VEIN SURGERY Left     There were no vitals filed for this visit.          ADULT SLP TREATMENT - 04/18/21 0001       General Information   Behavior/Cognition Alert;Cooperative;Pleasant mood;Impulsive;Lethargic      Cognitive-Linquistic Treatment   Treatment focused on Aphasia;Apraxia    Skilled Treatment Pt seemed more lethargic  today and intermittently tearful this session. SLP targeted writing names of common objects. Pt would not initiate task for spelling/writing names of pictured objects independently as pt became tearful; therefore, SLP wrote words after naming and patient copied on device. Good ability to copy exhibited. Pt able to independently verbalize names of pictures x3/18 with mild distortion. Pt able to approximate other objects x2 (ex: djouch for couch) mostly for ending sounds. Max verbal and visual modeling inconsistently effective to improve articulation and intelligiblity of single words. SLP targeting spelling task to unscramble words with inconsistent success.      Assessment / Recommendations / Plan   Plan Continue with current plan of care      Progression Toward Goals   Progression toward goals Progressing toward goals              SLP Education - 04/18/21 1410     Education Details writing for multimodal communication    Person(s) Educated Patient    Methods Explanation;Demonstration;Verbal cues    Comprehension Verbalized understanding;Returned demonstration;Verbal cues required              SLP Short Term Goals - 04/14/21 1329       SLP SHORT TERM GOAL #1   Title Pt will utilize external communication supports to augment  verbal expression for 4/5 opportunities given occasional mod A over 2 sessions    Baseline 04-04-21, 04-11-21    Status Achieved    Target Date 04/14/21      SLP SHORT TERM GOAL #2   Title Pt will accurately select written prompt to communicate wants/needs with 75% accuracy given occasional mod A over 2 sessions    Status Not Met    Target Date 04/14/21      SLP SHORT TERM GOAL #3   Title Pt will accurately approximate 1-2 phonemes of targeted icons/words on communications supports with 25% accuracy given consistent verbal modeling over 2 session    Baseline 04-11-21, 04-14-21    Status Achieved    Target Date 04/14/21              SLP Long Term  Goals - 04/18/21 1411       SLP LONG TERM GOAL #1   Title Pt will spontaneously utilize external communication supports to augment verbal expression for 4/5 opportunities over 3 sessions    Time 6    Period Weeks    Status On-going    Target Date 04/28/21      SLP LONG TERM GOAL #2   Title Pt will ID personally relevant objects in f:8 for communication of wants/needs with 75% accuracy given rare min A over 3 sessions    Baseline 04-18-21    Time 6    Period Weeks    Status On-going    Target Date 04/28/21      SLP LONG TERM GOAL #3   Title Pt will use mulitmodal communication (gesture, draw, write 1st letter etc) to augment verbal expression to meet needs at home with rare min A from family or caregivers over 2 sessions    Time 6    Period Weeks    Status On-going    Target Date 04/28/21      SLP LONG TERM GOAL #4   Title Pt will accurately approximate 1-2 phonemes of targeted icons/words on communications supports with 50% accuracy given consistent verbal modeling over 2 session    Time 6    Period Weeks    Status On-going    Target Date 04/28/21              Plan - 04/18/21 1341     Clinical Impression Statement Melanie Madden presents today for ST intervention to address 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 SLP targeted verbal naming of common household objects and writing. Pt became tearful when cued to write independently. Rare accurate independent verbal productions and some approximations noted, with some correct production and approximations exhibited with mod to max SLP verbal and visual modeling. SLP again re-iterated use of external communication aids to augment verbal expression due to severity of apraxia and aphasia. SLP recommends skilled ST intervention to maximize current communication skills via communication supports to increase communication effectiveness as well as less directly target some speech related tasks as pt desires to speak.     Speech Therapy Frequency 2x / week    Duration Other (comment)   6 weeks   Treatment/Interventions Oral motor exercises;Compensatory strategies;Cueing hierarchy;Functional tasks;Patient/family education;Cognitive reorganization;Multimodal communcation approach;Language facilitation;Compensatory techniques;Internal/external aids;SLP instruction and feedback    Potential to Achieve Goals Fair    Potential Considerations Previous level of function;Severity of impairments;Ability to learn/carryover information;Cooperation/participation level    SLP Home Exercise Plan provided    Consulted and Agree with Plan of Care Patient  Patient will benefit from skilled therapeutic intervention in order to improve the following deficits and impairments:   Aphasia  Verbal apraxia    Problem List Patient Active Problem List   Diagnosis Date Noted   Urinary retention    E. 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 [place], [place], [address] Phone: [phone]   Fax:  [phone]   Name: Melanie Madden MRN:  [id number] Date of Birth: [birth year]

## 2021-04-21 ENCOUNTER — Ambulatory Visit: Payer: Managed Care, Other (non HMO)

## 2021-04-21 ENCOUNTER — Other Ambulatory Visit: Payer: Self-pay

## 2021-04-21 ENCOUNTER — Ambulatory Visit: Payer: Managed Care, Other (non HMO) | Admitting: Physical Therapy

## 2021-04-21 VITALS — BP 160/100 | HR 90

## 2021-04-21 DIAGNOSIS — R482 Apraxia: Secondary | ICD-10-CM

## 2021-04-21 DIAGNOSIS — R2689 Other abnormalities of gait and mobility: Secondary | ICD-10-CM

## 2021-04-21 DIAGNOSIS — I69151 Hemiplegia and hemiparesis following nontraumatic intracerebral hemorrhage affecting right dominant side: Secondary | ICD-10-CM

## 2021-04-21 DIAGNOSIS — R4701 Aphasia: Secondary | ICD-10-CM

## 2021-04-21 DIAGNOSIS — I610 Nontraumatic intracerebral hemorrhage in hemisphere, subcortical: Secondary | ICD-10-CM | POA: Diagnosis not present

## 2021-04-21 DIAGNOSIS — M6281 Muscle weakness (generalized): Secondary | ICD-10-CM

## 2021-04-21 DIAGNOSIS — R2681 Unsteadiness on feet: Secondary | ICD-10-CM

## 2021-04-21 NOTE — Therapy (Signed)
[facility] [address] [place], [place], [address] Phone: [phone]   Fax:  [phone]  Physical Therapy Treatment  Patient Details  Name: Melanie Madden MRN: [id number] Date of Birth: February 10, 1966 Referring Provider (PT): Charlett Blake, MD   Encounter Date: 04/21/2021   PT End of Session - 04/21/21 1405     Visit Number 8    Number of Visits 17    Date for PT Re-Evaluation 05/14/21    Authorization Type [employer]; $50 copay for each discipline.  VL: 60, 8 used.  If she sees all three in same day, 3 visits used    Authorization - Visit Number 23   number corrected   Authorization - Number of Visits 69    PT Start Time 1405    PT Stop Time 1445    PT Time Calculation (min) 40 min    Activity Tolerance Patient tolerated treatment well    Behavior During Therapy WFL for tasks assessed/performed             Past Medical History:  Diagnosis Date   Allergy    Anxiety    Arthritis    Expressive aphasia    Hemiparesis ([place])    Right side   High triglycerides    History of kidney stones    Kidney stones    Recurrent sinus infections    Stroke [facility])     Past Surgical History:  Procedure Laterality Date   ANTERIOR CRUCIATE LIGAMENT REPAIR Left    BUNIONECTOMY Right    CYSTOSCOPY/URETEROSCOPY/HOLMIUM LASER/STENT PLACEMENT Right 02/08/2021   Procedure: CYSTOSCOPY/RETROGRADE/URETEROSCOPY/HOLMIUM LASER/STENT PLACEMENT, NEPHROSTOMY TUBE REMOVAL;  Surgeon: Ceasar Mons, MD;  Location: [facility];  Service: Urology;  Laterality: Right;   IR NEPHROSTOMY PLACEMENT RIGHT  12/09/2020   LASIK     LITHOTRIPSY     MENISCUS REPAIR Left    NASAL SEPTUM SURGERY     SHOULDER SURGERY     VARICOSE VEIN SURGERY Left     Vitals:   04/21/21 1411  BP: (!) 160/100  Pulse: 90     Subjective Assessment - 04/21/21 1407     Subjective Pt requesting to do mat exercises not treadmill again today.     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 No/denies                [facility] Adult PT Treatment/Exercise - 04/21/21 1711       Bed Mobility   Bed Mobility Sit to Supine;Supine to Sit    Supine to Sit Minimal Assistance - Patient > 75%    Sit to Supine Minimal Assistance - Patient > 75%      Neuro Re-ed    Neuro Re-ed Details  Performed supine exercises to facilitate increased activation in RLE: RLE single leg hamstring curls with foot on physioball 2 sets x 12 reps, bilat bridges with feet on ball for increased core activation 2 sets x 12 reps; RLE single leg bridge on physioball with therapist stabilizing RLE 2 sets x 12 reps, holding RLE on physioball and holding stable x 5 seconds x 5 reps without external assistance, RLE hip IR<>ER with foot on physioball  x 12 reps; removed physioball and performed bilat bridge with trunk rotation to R and L for anterior pelvic rotation x 12 reps.  In sitting placed L hand on top of R on physioball and performed 5 reps physioball roll outs with upright trunk progressing to roll out with squat x 5 reps >> roll outs with squat and weight shift to R x 5 reps >> roll outs with squat, weight shift and then attempting to lift heel of L foot to increase WB through RLE x 5 reps.  Pt very hesitant to lift L heel or keep heel lifted.      Exercises   Exercises Other Exercises    Other Exercises  Performed two sets of R passive hamstring stretch in supine x 60 seconds each; transitioned from hamstring stretch to IT band/hip ER stretch x 2 sets x 60 seconds.                     PT Education - 04/21/21 1706     Education Details Husband asking if there  is a lighter AFO pt could wear to allow her to do more seated exercises.  Advised pt and husband to remove shoe and AFO if she needs less weight/resistance but to otherwise continue to do prescribed exercises with aide.  Pt continues to ask if her R side will get better; continued to reinforce that she will make small increments in progress with continued practice and use of R side.    Person(s) Educated Patient;Spouse    Methods Explanation    Comprehension Verbalized understanding              PT Short Term Goals - 04/15/21 1548       PT SHORT TERM GOAL #1   Title Pt will perform 2/6 minute walk test of endurance with quad cane    Baseline 03/22/21  230' on 6 min walk    Time 4    Period Weeks    Status Achieved    Target Date 04/14/21      PT SHORT TERM GOAL #2   Title Pt will re-initiate updated HEP with supervision    Baseline Pt has been instucted in HEP with caregiver assist.    Time 4    Period Weeks    Status Achieved    Target Date 04/14/21      PT SHORT TERM GOAL #3   Title Pt will increase BERG by 4 points to indicate decreased falls risk    Baseline 21/56. 04/14/21 30/56    Time 4    Period Weeks    Status Achieved    Target Date 04/14/21      PT SHORT TERM GOAL #4   Title Pt will increase gait velocity to 0.25 m/sec with LRAD and supervision    Baseline .18 m/sec with quad cane. 04/14/21 0.54ms    Time 4    Period Weeks    Status Partially Met    Target Date 04/14/21               PT Long Term Goals - 03/22/21 1851       PT LONG TERM GOAL #1   Title Pt will be able to perform progressive HEP for strengthening, balance and mobility with assist of husband to continued gains at home.    Time 8    Period Weeks    Status New      PT LONG TERM GOAL #2  Title Pt will increase BERG balance to >/= 30/56 to indicate decreased falls risk    Baseline 21/56    Time 8    Period Weeks    Status New      PT LONG TERM GOAL #3   Title Pt will increase  6 min walk to >300' for improved mobility and activity tolerance.    Baseline 03/22/21 230'    Time 8    Period Weeks    Status Revised      PT LONG TERM GOAL #4   Title Pt will increase gait velocity to >/- 0.30 m/sec for improved household mobility with LRAD and supervision    Baseline .18 m/sec    Time 8    Period Weeks    Status New      PT LONG TERM GOAL #5   Title Pt will perform sit > stand MOD I with equal WB through R and LLE to indicate improved functional strength in RLE for transfers    Baseline weight shifted to LLE only    Time 8    Period Weeks    Status New                   Plan - 04/21/21 1658     Clinical Impression Statement Pt continues to report fatigue today and presented with elevated BP; declined treadmill again today.  Continued to focus on mat exercises in supine and sitting with physioball to facilitate increased use and activation of RLE.  When performing exercise in sitting, focused primarily on anterior and R lateral weight shift and stance phase control on RLE.  Pt continues to be very hesitant to place full WB through RLE.  No change in BP at end of session.    Personal Factors and Comorbidities Behavior Pattern;Comorbidity 3+;Past/Current Experience;Time since onset of injury/illness/exacerbation    Comorbidities anxiety, migraines, HTN, OA, expressive aphasia, R hemiplegia, kidney stones, L ACL repair, R bunionectomy, L meniscus repair, shoulder surgery, stroke    Examination-Activity Limitations Transfers;Stairs;Locomotion Level;Stand;Bend    Examination-Participation Restrictions Community Activity    Stability/Clinical Decision Making Evolving/Moderate complexity    Rehab Potential Good    PT Frequency 2x / week    PT Duration 8 weeks    PT Treatment/Interventions ADLs/Self Care Home Management;DME Instruction;Therapeutic activities;Functional mobility training;Stair training;Gait training;Therapeutic exercise;Balance  training;Neuromuscular re-education;Patient/family education;Manual techniques;Passive range of motion;Orthotic Fit/Training    PT Next Visit Plan Monitor BP closesly, Continue BWS over treadmill with ~30# unweighted. Performed at 0.37mh last visit with medium harness. Continue to work on right anterior pelvic rotation as tends to be posteriorly rotated to help initiate more hip flexion. Continue work on [place] on trunk control. Work on weight shifting to right in standing, functional strengthening, gait training with SBQC. Continue tall kneeling activities.    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   Nontraumatic subcortical hemorrhage of left cerebral hemisphere ([place]) 08/27/2019   Acute blood loss anemia    Hypoalbuminemia due to protein-calorie malnutrition (HCC)    Thrombocytopenia (HCC)    Dysphagia 07/29/2019   Infarction of left basal ganglia (HCC)  07/18/2019   Hypernatremia    Leukocytosis    Essential hypertension    Global aphasia    Cytotoxic brain edema ([place]) 07/10/2019   ICH (intracerebral hemorrhage) ([place]) 07/09/2019   Anxiety state 02/21/2015   Cephalalgia 12/21/2014    Rico Junker, PT, DPT 04/21/21    5:21 PM    [facility] [address] [place], [place], [address] Phone: [phone]   Fax:  [phone]  Name: Melanie Madden MRN: [id number] Date of Birth: 06-11-1966

## 2021-04-22 NOTE — Therapy (Signed)
[facility] [address] [place], [place], [address] Phone: [phone]   Fax:  [phone]  Speech Language Pathology Treatment  Patient Details  Name: Melanie Madden MRN: [id number] Date of Birth: March 30, 1966 Referring Provider (SLP): Charlett Blake, MD   Encounter Date: 04/21/2021   End of Session - 04/21/21 1314     Visit Number 8    Number of Visits 13    Date for SLP Re-Evaluation 04/28/21    Authorization Time Period 60 days combined    Authorization - Visit Number 7    Authorization - Number of Visits 10    SLP Start Time 1315    SLP Stop Time  1400    SLP Time Calculation (min) 45 min             Past Medical History:  Diagnosis Date   Allergy    Anxiety    Arthritis    Expressive aphasia    Hemiparesis ([place])    Right side   High triglycerides    History of kidney stones    Kidney stones    Recurrent sinus infections    Stroke [facility])     Past Surgical History:  Procedure Laterality Date   ANTERIOR CRUCIATE LIGAMENT REPAIR Left    BUNIONECTOMY Right    CYSTOSCOPY/URETEROSCOPY/HOLMIUM LASER/STENT PLACEMENT Right 02/08/2021   Procedure: CYSTOSCOPY/RETROGRADE/URETEROSCOPY/HOLMIUM LASER/STENT PLACEMENT, NEPHROSTOMY TUBE REMOVAL;  Surgeon: Ceasar Mons, MD;  Location: [facility];  Service: Urology;  Laterality: Right;   IR NEPHROSTOMY PLACEMENT RIGHT  12/09/2020   LASIK     LITHOTRIPSY     MENISCUS REPAIR Left    NASAL SEPTUM SURGERY     SHOULDER SURGERY     VARICOSE VEIN SURGERY Left     There were no vitals filed for this visit.   Subjective Assessment - 04/21/21 1315     Subjective pt's partner reports malfunction with [employer] and expecting to receive temporary loaner    Currently in Pain? No/denies                   ADULT SLP TREATMENT - 04/21/21 1313       General Information   Behavior/Cognition Alert;Cooperative;Pleasant  mood;Impulsive;Lethargic      Cognitive-Linquistic Treatment   Treatment focused on Aphasia;Apraxia    Skilled Treatment Pt arrived with partner this session. [employer] device to be sent to company as updates needed. SLP engaged patient in reading HEP and writing in response to personally relevant items. Pt verbally approximated words x2 in review, with usual mod-max SLP assistance required to aid articulation, with some approximations noted. In response to common objects, pt able to write correct word x1 and first letter x1. Pt benefitted from use of drawing x1 in conversation.      Assessment / Recommendations / Plan   Plan Continue with current plan of care      Progression Toward Goals   Progression toward goals Progressing toward goals              SLP Education - 04/22/21 1349     Education Details writing, first letters when able    Person(s) Educated Patient;Spouse    Methods Explanation;Demonstration;Verbal cues    Comprehension Verbalized understanding;Returned demonstration;Verbal cues required              SLP Short Term Goals - 04/14/21 1329       SLP SHORT TERM GOAL #1   Title Pt will  utilize external communication supports to augment verbal expression for 4/5 opportunities given occasional mod A over 2 sessions    Baseline 04-04-21, 04-11-21    Status Achieved    Target Date 04/14/21      SLP SHORT TERM GOAL #2   Title Pt will accurately select written prompt to communicate wants/needs with 75% accuracy given occasional mod A over 2 sessions    Status Not Met    Target Date 04/14/21      SLP SHORT TERM GOAL #3   Title Pt will accurately approximate 1-2 phonemes of targeted icons/words on communications supports with 25% accuracy given consistent verbal modeling over 2 session    Baseline 04-11-21, 04-14-21    Status Achieved    Target Date 04/14/21              SLP Long Term Goals - 04/21/21 1315       SLP LONG TERM GOAL #1   Title Pt will  spontaneously utilize external communication supports to augment verbal expression for 4/5 opportunities over 3 sessions    Time 6    Period Weeks    Status On-going    Target Date 04/28/21      SLP LONG TERM GOAL #2   Title Pt will ID personally relevant objects in f:8 for communication of wants/needs with 75% accuracy given rare min A over 3 sessions    Baseline 04-18-21    Time 6    Period Weeks    Status On-going    Target Date 04/28/21      SLP LONG TERM GOAL #3   Title Pt will use mulitmodal communication (gesture, draw, write 1st letter etc) to augment verbal expression to meet needs at home with rare min A from family or caregivers over 2 sessions    Baseline 04-21-21    Time 6    Period Weeks    Status On-going    Target Date 04/28/21      SLP LONG TERM GOAL #4   Title Pt will accurately approximate 1-2 phonemes of targeted icons/words on communications supports with 50% accuracy given consistent verbal modeling over 2 session    Baseline 04-21-21    Time 6    Period Weeks    Status On-going    Target Date 04/28/21              Plan - 04/21/21 1314     Clinical Impression Statement [name] presents today for ST intervention to address 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 SLP targeted verbal naming of common household objects and writing. Pt exhibited rare approximations (mostly endings) and pt able to occasional write words and first letters, although some errors and inconsistency exhibited. SLP again re-iterated use of external communication aids to augment verbal expression due to severity of apraxia and aphasia. SLP recommends skilled ST intervention to maximize current communication skills via communication supports to increase communication effectiveness as well as less directly target some speech related tasks as pt desires to speak.    Speech Therapy Frequency 2x / week    Duration Other (comment)   6 weeks   Treatment/Interventions Oral  motor exercises;Compensatory strategies;Cueing hierarchy;Functional tasks;Patient/family education;Cognitive reorganization;Multimodal communcation approach;Language facilitation;Compensatory techniques;Internal/external aids;SLP instruction and feedback    Potential to Achieve Goals Fair    Potential Considerations Previous level of function;Severity of impairments;Ability to learn/carryover information;Cooperation/participation level    SLP Home Exercise Plan provided    Consulted and Agree with Plan of  Care Patient             Patient will benefit from skilled therapeutic intervention in order to improve the following deficits and impairments:   Aphasia  Verbal apraxia    Problem List Patient Active Problem List   Diagnosis Date Noted   Urinary retention    E. 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 [place], [place], [address] Phone: [phone]   Fax:  [phone]   Name: Melanie Madden MRN: [id number] Date of Birth: [birth year]

## 2021-04-25 ENCOUNTER — Ambulatory Visit: Payer: Managed Care, Other (non HMO)

## 2021-04-25 ENCOUNTER — Other Ambulatory Visit: Payer: Self-pay

## 2021-04-25 VITALS — BP 158/92

## 2021-04-25 DIAGNOSIS — M6281 Muscle weakness (generalized): Secondary | ICD-10-CM

## 2021-04-25 DIAGNOSIS — I69151 Hemiplegia and hemiparesis following nontraumatic intracerebral hemorrhage affecting right dominant side: Secondary | ICD-10-CM

## 2021-04-25 DIAGNOSIS — R2689 Other abnormalities of gait and mobility: Secondary | ICD-10-CM

## 2021-04-25 DIAGNOSIS — R4701 Aphasia: Secondary | ICD-10-CM

## 2021-04-25 DIAGNOSIS — R482 Apraxia: Secondary | ICD-10-CM

## 2021-04-25 DIAGNOSIS — I610 Nontraumatic intracerebral hemorrhage in hemisphere, subcortical: Secondary | ICD-10-CM | POA: Diagnosis not present

## 2021-04-25 NOTE — Therapy (Signed)
[facility] [address] [place], [place], [address] Phone: [phone]   Fax:  [phone]  Physical Therapy Treatment  Patient Details  Name: Melanie Madden MRN: [id number] Date of Birth: 01-19-1966 Referring Provider (PT): Charlett Blake, MD   Encounter Date: 04/25/2021   PT End of Session - 04/25/21 1406     Visit Number 9    Number of Visits 17    Date for PT Re-Evaluation 05/14/21    Authorization Type [employer]; $50 copay for each discipline.  VL: 60, 8 used.  If she sees all three in same day, 3 visits used    Authorization - Visit Number 23   number corrected   Authorization - Number of Visits 68    PT Start Time 1403    PT Stop Time 1444    PT Time Calculation (min) 41 min    Activity Tolerance Patient tolerated treatment well    Behavior During Therapy WFL for tasks assessed/performed             Past Medical History:  Diagnosis Date   Allergy    Anxiety    Arthritis    Expressive aphasia    Hemiparesis ([place])    Right side   High triglycerides    History of kidney stones    Kidney stones    Recurrent sinus infections    Stroke [facility])     Past Surgical History:  Procedure Laterality Date   ANTERIOR CRUCIATE LIGAMENT REPAIR Left    BUNIONECTOMY Right    CYSTOSCOPY/URETEROSCOPY/HOLMIUM LASER/STENT PLACEMENT Right 02/08/2021   Procedure: CYSTOSCOPY/RETROGRADE/URETEROSCOPY/HOLMIUM LASER/STENT PLACEMENT, NEPHROSTOMY TUBE REMOVAL;  Surgeon: Ceasar Mons, MD;  Location: [facility];  Service: Urology;  Laterality: Right;   IR NEPHROSTOMY PLACEMENT RIGHT  12/09/2020   LASIK     LITHOTRIPSY     MENISCUS REPAIR Left    NASAL SEPTUM SURGERY     SHOULDER SURGERY     VARICOSE VEIN SURGERY Left     Vitals:   04/25/21 1408  BP: (!) 158/92     Subjective Assessment - 04/25/21 1406     Subjective Pt reports that she is having tingling down whole right side of body.  Pointing from head down to foot to that side. Shakes head no to if that is any different to what she normally feels.    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 No/denies                               [facility] Adult PT Treatment/Exercise - 04/25/21 0001       Transfers   Transfers Sit to Stand;Stand to Sit    Sit to Stand 5: Supervision;4: Min guard    Sit to Stand Details Verbal cues for technique;Tactile cues for weight shifting    Stand to Sit 5: Supervision;4: Min guard    Stand to Sit Details (indicate cue type and reason) Verbal cues for technique;Tactile cues for weight shifting      Ambulation/Gait   Ambulation/Gait Yes    Ambulation/Gait Assistance 5: Supervision    Ambulation/Gait Assistance Details  weaving in and out of 4 cones and over red mat x 2 during walk. Verbal cues for sequence when stepping on the mat. Pt still stepped on with RLE despite cuing to step up with left.    Ambulation Distance (Feet) 230 Feet    Assistive device Small based quad cane   right AFO   Gait Pattern Step-through pattern;Decreased stance time - right;Right genu recurvatum;Right circumduction    Ambulation Surface Level;Indoor      Neuro Re-ed    Neuro Re-ed Details  Sit to stands from mat with 2" step under left foot to try to facilitate right weight shift but pt at first lifting right foot all the way off floor. PT assisted to facilitate with holding the standing position and verbal/tactile cues to keep head up. Performed x 5 with PT helping to keep weight to the right with descent. Tapping 2" step with LLE x 10 then holding the position x 3 with trying to let go for brief  periods. Staggered stance with LLE in front x 30 sec.      Exercises   Exercises Other Exercises      Knee/Hip Exercises: Aerobic   Other Aerobic Sci Fit x 8 min level 3 with legs only with PT stabilizing right leg to keep hip in neutral.                       PT Short Term Goals - 04/15/21 1548       PT SHORT TERM GOAL #1   Title Pt will perform 2/6 minute walk test of endurance with quad cane    Baseline 03/22/21  230' on 6 min walk    Time 4    Period Weeks    Status Achieved    Target Date 04/14/21      PT SHORT TERM GOAL #2   Title Pt will re-initiate updated HEP with supervision    Baseline Pt has been instucted in HEP with caregiver assist.    Time 4    Period Weeks    Status Achieved    Target Date 04/14/21      PT SHORT TERM GOAL #3   Title Pt will increase BERG by 4 points to indicate decreased falls risk    Baseline 21/56. 04/14/21 30/56    Time 4    Period Weeks    Status Achieved    Target Date 04/14/21      PT SHORT TERM GOAL #4   Title Pt will increase gait velocity to 0.25 m/sec with LRAD and supervision    Baseline .18 m/sec with quad cane. 04/14/21 0.30m/s    Time 4    Period Weeks    Status Partially Met    Target Date 04/14/21               PT Long Term Goals - 04/25/21 1543       PT LONG TERM GOAL #1   Title Pt will be able to perform progressive HEP for strengthening, balance and mobility with assist of husband to continued gains at home. ( LTGs due 10/16)    Time 8    Period Weeks    Status New      PT LONG TERM GOAL #2   Title Pt will increase BERG balance to >/= 30/56 to indicate decreased falls risk    Baseline 21/56    Time 8    Period Weeks    Status New  PT LONG TERM GOAL #3   Title Pt will increase 6 min walk to >300' for improved mobility and activity tolerance.    Baseline 03/22/21 230'    Time 8    Period Weeks    Status Revised      PT LONG TERM GOAL #4   Title Pt will increase gait velocity to  >/- 0.30 m/sec for improved household mobility with LRAD and supervision    Baseline .18 m/sec    Time 8    Period Weeks    Status New      PT LONG TERM GOAL #5   Title Pt will perform sit > stand MOD I with equal WB through R and LLE to indicate improved functional strength in RLE for transfers    Baseline weight shifted to LLE only    Time 8    Period Weeks    Status New                   Plan - 04/25/21 1543     Clinical Impression Statement Pt declined to try treadmill again. Continued to work on gait around obstacles and strengthening/weight shift on RLE more. Pt needs tactile assist to increase weight shift to right with acrivities.    Personal Factors and Comorbidities Behavior Pattern;Comorbidity 3+;Past/Current Experience;Time since onset of injury/illness/exacerbation    Comorbidities anxiety, migraines, HTN, OA, expressive aphasia, R hemiplegia, kidney stones, L ACL repair, R bunionectomy, L meniscus repair, shoulder surgery, stroke    Examination-Activity Limitations Transfers;Stairs;Locomotion Level;Stand;Bend    Examination-Participation Restrictions Community Activity    Stability/Clinical Decision Making Evolving/Moderate complexity    Rehab Potential Good    PT Frequency 2x / week    PT Duration 8 weeks    PT Treatment/Interventions ADLs/Self Care Home Management;DME Instruction;Therapeutic activities;Functional mobility training;Stair training;Gait training;Therapeutic exercise;Balance training;Neuromuscular re-education;Patient/family education;Manual techniques;Passive range of motion;Orthotic Fit/Training    PT Next Visit Plan Monitor BP closesly, Continue BWS over treadmill with ~30# unweighted. Performed at 0.52mph last visit with medium harness. Continue to work on right anterior pelvic rotation as tends to be posteriorly rotated to help initiate more hip flexion. Continue work on [place] working on trunk control. Work on weight shifting to right in  standing, functional strengthening, gait training with SBQC. Continue tall kneeling activities.    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

## 2021-04-26 NOTE — Therapy (Signed)
[facility] [address] [place], [place], [address] Phone: [phone]   Fax:  [phone]  Speech Language Pathology Treatment  Patient Details  Name: Melanie Madden MRN: [id number] Date of Birth: 05/15/1966 Referring Provider (SLP): Charlett Blake, MD   Encounter Date: 04/25/2021   End of Session - 04/25/21 1430     Visit Number 9    Number of Visits 13    Date for SLP Re-Evaluation 04/28/21    Authorization Time Period 60 days combined    Authorization - Visit Number 8    Authorization - Number of Visits 10    SLP Start Time 0321    SLP Stop Time  1530    SLP Time Calculation (min) 45 min    Activity Tolerance Patient tolerated treatment well             Past Medical History:  Diagnosis Date   Allergy    Anxiety    Arthritis    Expressive aphasia    Hemiparesis ([place])    Right side   High triglycerides    History of kidney stones    Kidney stones    Recurrent sinus infections    Stroke [facility])     Past Surgical History:  Procedure Laterality Date   ANTERIOR CRUCIATE LIGAMENT REPAIR Left    BUNIONECTOMY Right    CYSTOSCOPY/URETEROSCOPY/HOLMIUM LASER/STENT PLACEMENT Right 02/08/2021   Procedure: CYSTOSCOPY/RETROGRADE/URETEROSCOPY/HOLMIUM LASER/STENT PLACEMENT, NEPHROSTOMY TUBE REMOVAL;  Surgeon: Ceasar Mons, MD;  Location: [facility];  Service: Urology;  Laterality: Right;   IR NEPHROSTOMY PLACEMENT RIGHT  12/09/2020   LASIK     LITHOTRIPSY     MENISCUS REPAIR Left    NASAL SEPTUM SURGERY     SHOULDER SURGERY     VARICOSE VEIN SURGERY Left     There were no vitals filed for this visit.   Subjective Assessment - 04/25/21 1431     Subjective will receive loaner device today    Currently in Pain? No/denies                   ADULT SLP TREATMENT - 04/25/21 1430       General Information   Behavior/Cognition Alert;Cooperative;Pleasant  mood;Impulsive;Requires cueing      Cognitive-Linquistic Treatment   Treatment focused on Aphasia;Apraxia    Skilled Treatment SLP targeted use of multimodal communication to augment verbal expression. Pt has not yet received loaner AAC device, with personal device brought today. SLP provided max cues to utilize Lingraphica to aid communication. Pt occasionally utilized gestures independently; however, some gestures were ambiguous and did not aid communication. SLP targeted writing on [place], in which pt accurately wrote single words x3 with occasional approximations that were similiar enough to aid listener comprehension in context. Rare accurate verbal productions of single words exhibited even with max SLP modeling and cues. Pt continues to exhibit verbal neologisms and unintellgible perseverations in conversation. SLP re-iterated to patient and caregiver that patient may continue to practice verbal output at home but due to severity of aphasia and apraxia, SLP highly recommended use of multimodal or augmentative communication to aid communication. Pt seemed to express desire to continue ST services to address verbal output; however, pt exhibits maximum potential for verbal expression at this time per SLP analysis.      Assessment / Recommendations / Plan   Plan Continue with current plan of care      Progression Toward Goals   Progression  toward goals Progressing toward goals              SLP Education - 04/26/21 0959     Education Details explanation of apraxia, use multimodal communication at home    Person(s) Educated Patient;Caregiver(s)    Methods Explanation;Demonstration;Verbal cues    Comprehension Verbalized understanding;Returned demonstration;Verbal cues required              SLP Short Term Goals - 04/14/21 1329       SLP SHORT TERM GOAL #1   Title Pt will utilize external communication supports to augment verbal expression for 4/5 opportunities given occasional  mod A over 2 sessions    Baseline 04-04-21, 04-11-21    Status Achieved    Target Date 04/14/21      SLP SHORT TERM GOAL #2   Title Pt will accurately select written prompt to communicate wants/needs with 75% accuracy given occasional mod A over 2 sessions    Status Not Met    Target Date 04/14/21      SLP SHORT TERM GOAL #3   Title Pt will accurately approximate 1-2 phonemes of targeted icons/words on communications supports with 25% accuracy given consistent verbal modeling over 2 session    Baseline 04-11-21, 04-14-21    Status Achieved    Target Date 04/14/21              SLP Long Term Goals - 04/25/21 1431       SLP LONG TERM GOAL #1   Title Pt will spontaneously utilize external communication supports to augment verbal expression for 4/5 opportunities over 3 sessions    Time 6    Period Weeks    Status On-going    Target Date 04/28/21      SLP LONG TERM GOAL #2   Title Pt will ID personally relevant objects in f:8 for communication of wants/needs with 75% accuracy given rare min A over 3 sessions    Baseline 04-18-21    Time 6    Period Weeks    Status On-going    Target Date 04/28/21      SLP LONG TERM GOAL #3   Title Pt will use mulitmodal communication (gesture, draw, write 1st letter etc) to augment verbal expression to meet needs at home with rare min A from family or caregivers over 2 sessions    Baseline 04-21-21    Time 6    Period Weeks    Status On-going    Target Date 04/28/21      SLP LONG TERM GOAL #4   Title Pt will accurately approximate 1-2 phonemes of targeted icons/words on communications supports with 50% accuracy given consistent verbal modeling over 2 session    Baseline 04-21-21    Time 6    Period Weeks    Status On-going    Target Date 04/28/21              Plan - 04/25/21 1430     Clinical Impression Statement Melanie Madden presents today for ST intervention to address 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 SLP  targeted verbal naming of common household objects and writing. Pt exhibited rare approximations (mostly endings) and pt able to occasional write words and first letters, although inconsistent spelling ability exhibited. SLP again re-iterated use of external communication aids to augment verbal expression due to severity of apraxia and aphasia. SLP recommends skilled ST intervention to maximize current communication skills via communication supports to increase communication effectiveness  as well as less directly target some speech related tasks as pt desires to speak.    Speech Therapy Frequency 2x / week    Duration Other (comment)   6 weeks for scheduling   Treatment/Interventions Oral motor exercises;Compensatory strategies;Cueing hierarchy;Functional tasks;Patient/family education;Cognitive reorganization;Multimodal communcation approach;Language facilitation;Compensatory techniques;Internal/external aids;SLP instruction and feedback    Potential to Achieve Goals Fair    Potential Considerations Previous level of function;Severity of impairments;Ability to learn/carryover information;Cooperation/participation level    SLP Home Exercise Plan provided    Consulted and Agree with Plan of Care Patient             Patient will benefit from skilled therapeutic intervention in order to improve the following deficits and impairments:   Aphasia  Verbal apraxia    Problem List Patient Active Problem List   Diagnosis Date Noted   Urinary retention    E. 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

## 2021-04-27 ENCOUNTER — Ambulatory Visit: Payer: Managed Care, Other (non HMO)

## 2021-04-28 ENCOUNTER — Encounter
Payer: Managed Care, Other (non HMO) | Attending: Physical Medicine & Rehabilitation | Admitting: Physical Medicine & Rehabilitation

## 2021-04-28 ENCOUNTER — Other Ambulatory Visit: Payer: Self-pay

## 2021-04-28 ENCOUNTER — Ambulatory Visit: Payer: Managed Care, Other (non HMO) | Admitting: Physical Therapy

## 2021-04-28 ENCOUNTER — Ambulatory Visit: Payer: Managed Care, Other (non HMO)

## 2021-04-28 ENCOUNTER — Encounter: Payer: Self-pay | Admitting: Physical Medicine & Rehabilitation

## 2021-04-28 VITALS — BP 165/105 | HR 111 | Temp 98.0°F | Ht 66.0 in | Wt 138.4 lb

## 2021-04-28 VITALS — BP 140/90

## 2021-04-28 DIAGNOSIS — M6281 Muscle weakness (generalized): Secondary | ICD-10-CM

## 2021-04-28 DIAGNOSIS — I610 Nontraumatic intracerebral hemorrhage in hemisphere, subcortical: Secondary | ICD-10-CM | POA: Diagnosis not present

## 2021-04-28 DIAGNOSIS — R2681 Unsteadiness on feet: Secondary | ICD-10-CM

## 2021-04-28 DIAGNOSIS — I69319 Unspecified symptoms and signs involving cognitive functions following cerebral infarction: Secondary | ICD-10-CM | POA: Diagnosis not present

## 2021-04-28 DIAGNOSIS — I69398 Other sequelae of cerebral infarction: Secondary | ICD-10-CM | POA: Insufficient documentation

## 2021-04-28 DIAGNOSIS — R4701 Aphasia: Secondary | ICD-10-CM

## 2021-04-28 DIAGNOSIS — R293 Abnormal posture: Secondary | ICD-10-CM

## 2021-04-28 DIAGNOSIS — R269 Unspecified abnormalities of gait and mobility: Secondary | ICD-10-CM | POA: Diagnosis not present

## 2021-04-28 DIAGNOSIS — R482 Apraxia: Secondary | ICD-10-CM

## 2021-04-28 DIAGNOSIS — I69151 Hemiplegia and hemiparesis following nontraumatic intracerebral hemorrhage affecting right dominant side: Secondary | ICD-10-CM

## 2021-04-28 DIAGNOSIS — R2689 Other abnormalities of gait and mobility: Secondary | ICD-10-CM

## 2021-04-28 NOTE — Progress Notes (Signed)
Subjective:    Patient ID: Melanie Madden, female    DOB: Oct 23, 1965, 55 y.o.   MRN: [id number] 55 yo female with hx of Large Left Lentifrom nucleus hemorrhage, as well as global aphasia, speech apraxia, right spastic hemiparesis .onset 06/2019 HPI Undergoing outpatient PT and speech.  Speech has 1 more visit, insurance limits, main focus is using augmentative communication device. PT demonstrating some improvement in balance. Elevated blood pressure, discussed with patient she shakes her head no when I asked her to follow-up with her primary care and that she may need another medicine added to the amlodipine.  Caregiver states that she actually needs to sneak the amlodipine into the yogurt in the morning as patient may otherwise refuse to take this. We discussed other recommended follow-ups including neuropsychology Dr. Sima Matas scheduled appointment in December, patient shakes her head no to this. She nodded affirmative in seeing neurology, Dr. Leonie Man. No falls Pain Inventory Average Pain 0 Pain Right Now 0 My pain is  no pain  LOCATION OF PAIN  no pain  BOWEL Number of stools per week: 7 Oral laxative use No  Type of laxative na Enema or suppository use No  History of colostomy No  Incontinent No   BLADDER Suprapubic In and out cath, frequency na Able to self cath  na Bladder incontinence No  Frequent urination No  Leakage with coughing No  Difficulty starting stream No  Incomplete bladder emptying No    Mobility walk with assistance use a cane how many minutes can you walk? 5 ability to climb steps?  no do you drive?  no  Function disabled: date disabled . I need assistance with the following:  household duties  Neuro/Psych weakness trouble walking  Prior Studies Any changes since last visit?  no  Physicians involved in your care Any changes since last visit?  no   Family History  Problem Relation Age of Onset   Alzheimer's disease  Mother    Hypertension Mother    Diabetes Mother    Thyroid disease Mother    Deep vein thrombosis Maternal Grandmother    Colon cancer Neg Hx    Colon polyps Neg Hx    Esophageal cancer Neg Hx    Pancreatic cancer Neg Hx    Rectal cancer Neg Hx    Stomach cancer Neg Hx    Breast cancer Neg Hx    Social History   Socioeconomic History   Marital status: Significant Other    Spouse name: Not on file   Number of children: Not on file   Years of education: Not on file   Highest education level: Not on file  Occupational History   Not on file  Tobacco Use   Smoking status: Former   Smokeless tobacco: Never  Vaping Use   Vaping Use: Never used  Substance and Sexual Activity   Alcohol use: Not Currently   Drug use: No   Sexual activity: Yes    Partners: Male  Other Topics Concern   Not on file  Social History Narrative   Not on file   Social Determinants of Health   Financial Resource Strain: Not on file  Food Insecurity: Not on file  Transportation Needs: Not on file  Physical Activity: Not on file  Stress: Not on file  Social Connections: Not on file   Past Surgical History:  Procedure Laterality Date   ANTERIOR CRUCIATE LIGAMENT REPAIR Left    BUNIONECTOMY Right  CYSTOSCOPY/URETEROSCOPY/HOLMIUM LASER/STENT PLACEMENT Right 02/08/2021   Procedure: CYSTOSCOPY/RETROGRADE/URETEROSCOPY/HOLMIUM LASER/STENT PLACEMENT, NEPHROSTOMY TUBE REMOVAL;  Surgeon: Ceasar Mons, MD;  [facility];  Service: Urology;  Laterality: Right;   IR NEPHROSTOMY PLACEMENT RIGHT  12/09/2020   LASIK     LITHOTRIPSY     MENISCUS REPAIR Left    NASAL SEPTUM SURGERY     SHOULDER SURGERY     VARICOSE VEIN SURGERY Left    Past Medical History:  Diagnosis Date   Allergy    Anxiety    Arthritis    Expressive aphasia    Hemiparesis ([place])    Right side   High triglycerides    History of kidney stones    Kidney stones    Recurrent sinus infections    Stroke (HCC)    BP  (!) 165/105   Pulse (!) 111   Temp 98 F (36.7 C) (Oral)   Ht 5\' 6"  (1.676 m)   Wt 138 lb 6.4 oz (62.8 kg)   SpO2 97%   BMI 22.34 kg/m   Opioid Risk Score:   Fall Risk Score:  `1  Depression screen PHQ 2/9  Depression screen [facility] 2/9 11/10/2020 05/06/2020 02/05/2020 09/25/2019  Decreased Interest 0 0 3 0  Down, Depressed, Hopeless 0 0 3 1  PHQ - 2 Score 0 0 6 1  Altered sleeping - - - 0  Tired, decreased energy - - - 0  Change in appetite - - - 0  Feeling bad or failure about yourself  - - - 0  Trouble concentrating - - - 0  Moving slowly or fidgety/restless - - - 1  Suicidal thoughts - - - 0  PHQ-9 Score - - - 2  Difficult doing work/chores - - - Somewhat difficult  Some recent data might be hidden     Review of Systems  Musculoskeletal:  Positive for gait problem.  Neurological:  Positive for weakness.  All other systems reviewed and are negative.     Objective:   Physical Exam Vitals and nursing note reviewed.  Constitutional:      Appearance: She is normal weight.  HENT:     Head: Normocephalic and atraumatic.  Skin:    General: Skin is warm and dry.  Neurological:     Mental Status: She is alert and oriented to person, place, and time.     Cranial Nerves: Dysarthria present.     Coordination: Coordination abnormal.     Gait: Gait abnormal.     Comments: Severe expressive aphasia and verbal apraxia Motor strength is 0/5 in the right upper extremity unchanged Motor strength is 3 - hip knee extensor synergy and 3 - at the knee extensor right lower extremity unchanged Left side has normal strength in the upper and lower limbs Sensation difficult to assess secondary to severe aphasia. Ambulates with a four-point cane as well as right AFO  Psychiatric:        Mood and Affect: Mood normal.        Behavior: Behavior normal.          Assessment & Plan:   1.  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  She  had some deconditioning after hospitalization for pyelonephritis last summer. No change in upper or lower extremity strength as expected given that stroke is almost 2 years ago.  Continue Seroquel 50 mg twice daily as well as sertraline 50 mg a day for mood disorder and agitation. 2.  Hypertension uncontrolled spoke with patient's husband Appears to have some fluctuations between 130 and 160 although today was a little higher.  I have called patient's primary care physician Dr. Ernie Hew  left a message.  Have encouraged patient to follow-up with neuropsychology. Physical medicine rehab follow-up in 6 months.

## 2021-04-28 NOTE — Therapy (Signed)
[facility] [address] [place], [place], [address] Phone: [phone]   Fax:  [phone]  Speech Language Pathology Treatment/Discharge Summary  Patient Details  Name: Melanie Madden MRN: [id number] Date of Birth: 10/27/1965 Referring Provider (SLP): Charlett Blake, MD   Encounter Date: 04/28/2021   End of Session - 04/28/21 1307     Visit Number 10    Number of Visits 13    Date for SLP Re-Evaluation 04/28/21    Authorization Time Period 60 days combined    Authorization - Visit Number 9    Authorization - Number of Visits 10    SLP Start Time 8372    SLP Stop Time  1400    SLP Time Calculation (min) 45 min    Activity Tolerance Patient tolerated treatment well             Past Medical History:  Diagnosis Date   Allergy    Anxiety    Arthritis    Expressive aphasia    Hemiparesis ([place])    Right side   High triglycerides    History of kidney stones    Kidney stones    Recurrent sinus infections    Stroke [facility])     Past Surgical History:  Procedure Laterality Date   ANTERIOR CRUCIATE LIGAMENT REPAIR Left    BUNIONECTOMY Right    CYSTOSCOPY/URETEROSCOPY/HOLMIUM LASER/STENT PLACEMENT Right 02/08/2021   Procedure: CYSTOSCOPY/RETROGRADE/URETEROSCOPY/HOLMIUM LASER/STENT PLACEMENT, NEPHROSTOMY TUBE REMOVAL;  Surgeon: Ceasar Mons, MD;  [facility];  Service: Urology;  Laterality: Right;   IR NEPHROSTOMY PLACEMENT RIGHT  12/09/2020   LASIK     LITHOTRIPSY     MENISCUS REPAIR Left    NASAL SEPTUM SURGERY     SHOULDER SURGERY     VARICOSE VEIN SURGERY Left     There were no vitals filed for this visit.   Subjective Assessment - 04/28/21 1452     Subjective brought out loaner device    Patient is accompained by: Family member   caregiver   Currently in Pain? No/denies              SPEECH THERAPY DISCHARGE SUMMARY  Visits from Start of Care:  10  Current functional level related to goals / functional outcomes: Johara is discharging from skilled ST targeting aphasia and apraxia. Given limited progress for verbal expression since onset of stroke in 2020, SLP recommended this ST intervention focus on augmentative and multimodal communication due to frustration and decreased functional ability to verbally express wants and needs with family and caregivers. Pt required consistent mod verbal and visual SLP modeling to articulate/approximate words and phrases as pt unable to replicate correct articulation in secondary trials or with fading SLP cues. Some accurate single words exhibited for writing, with occasional approximations achieved. Pt able to demo use of Lingraphica device to aid communication; however, pt continues to exhibit limited desire to use in therapy and at home. Per patient preference to focus on verbal expression but limited progress achieved for functional verbal expression in OPST thus far, SLP recommended intensive speech therapy programs to patient and caregiver. Handout of aphasia programs provided.   Remaining deficits: Aphasia and apraxia   Education / Equipment: Recommendations for multimodal and AAC to augment verbal expression, intensive programs for aphasia/apraxia      ADULT SLP TREATMENT - 04/28/21 1307       General Information   Behavior/Cognition Alert;Cooperative;Pleasant mood;Impulsive;Requires cueing  Cognitive-Linquistic Treatment   Treatment focused on Aphasia;Apraxia    Skilled Treatment Pt exhibited reduced participation this session, requiring max cues to write, copy, and verbalize objects/items relevant to her personal life. Usual modeling and consistent cues required to articulate/approximate words, as pt unable to initiate correct sounds without consistent SLP verbal and visual cues. SLP provided handout with intensive aphasia programs to trial that would be more related to patient personal  goals targeting verbal expression. Pt indicated understanding of recommendation and ST discharge today.      Assessment / Recommendations / Plan   Plan Discharge SLP treatment due to (comment)   POC complete     Progression Toward Goals   Progression toward goals Goals partially met, education completed, patient discharged from [place] Education - 04/28/21 1406     Education Details use multimodal comm/AAC device at home, continue to write, aphasia program recommendations    Person(s) Educated Patient;Caregiver(s)    Methods Explanation;Demonstration;Handout    Comprehension Verbalized understanding;Returned demonstration              SLP Short Term Goals - 04/14/21 1329       SLP SHORT TERM GOAL #1   Title Pt will utilize external communication supports to augment verbal expression for 4/5 opportunities given occasional mod A over 2 sessions    Baseline 04-04-21, 04-11-21    Status Achieved    Target Date 04/14/21      SLP SHORT TERM GOAL #2   Title Pt will accurately select written prompt to communicate wants/needs with 75% accuracy given occasional mod A over 2 sessions    Status Not Met    Target Date 04/14/21      SLP SHORT TERM GOAL #3   Title Pt will accurately approximate 1-2 phonemes of targeted icons/words on communications supports with 25% accuracy given consistent verbal modeling over 2 session    Baseline 04-11-21, 04-14-21    Status Achieved    Target Date 04/14/21              SLP Long Term Goals - 04/28/21 1451       SLP LONG TERM GOAL #1   Title Pt will spontaneously utilize external communication supports to augment verbal expression for 4/5 opportunities over 3 sessions    Status Not Met      SLP LONG TERM GOAL #2   Title Pt will ID personally relevant objects in f:8 for communication of wants/needs with 75% accuracy given rare min A over 3 sessions    Baseline 04-18-21    Status Partially Met      SLP LONG TERM GOAL #3    Title Pt will use mulitmodal communication (gesture, draw, write 1st letter etc) to augment verbal expression to meet needs at home with rare min A from family or caregivers over 2 sessions    Baseline 04-21-21    Status Partially Met      SLP LONG TERM GOAL #4   Title Pt will accurately approximate 1-2 phonemes of targeted icons/words on communications supports with 50% accuracy given consistent verbal modeling over 2 session    Baseline 04-21-21    Status Partially Met              Plan - 04/28/21 1407     Clinical Impression Statement Codie presents today for ST intervention to address 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 SLP  targeted verbal naming of common household objects and writing. Pt exhibited reduced participation and engagement this session requiring prompting to write and verbalize targeted items. Rare verbal approximations exhibited, with consistent verbal and visual cues required for articulation of single words/short phrases. Pt unable to produce correct articulation placements for repeat trials or with fading SLP cues. SLP again re-iterated use of external communication aids to augment verbal expression due to severity of apraxia and aphasia. SLP recommended various intensive aphasia programs to target verbal expression as this is patient's main objective for ST. Pt indicated understanding and agreement with ST discharge.    Treatment/Interventions Oral motor exercises;Compensatory strategies;Cueing hierarchy;Functional tasks;Patient/family education;Cognitive reorganization;Multimodal communcation approach;Language facilitation;Compensatory techniques;Internal/external aids;SLP instruction and feedback    Potential to Achieve Goals Fair    Potential Considerations Previous level of function;Severity of impairments;Ability to learn/carryover information;Cooperation/participation level    SLP Home Exercise Plan provided    Consulted and Agree with Plan of  Care Patient;Family member/caregiver             Patient will benefit from skilled therapeutic intervention in order to improve the following deficits and impairments:   Aphasia  Verbal apraxia    Problem List Patient Active Problem List   Diagnosis Date Noted   Urinary retention    E. 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 [place], [place], [address] Phone: [phone]   Fax:  [phone]   Name: Raivyn Kabler MRN: [id number] Date of Birth: 05/04/1966

## 2021-04-28 NOTE — Therapy (Signed)
[facility] [address] [place], [place], [address] Phone: [phone]   Fax:  [phone]  Physical Therapy Treatment  Patient Details  Name: Melanie Madden MRN: [id number] Date of Birth: 06/18/1966 Referring Provider (PT): Charlett Blake, MD   Encounter Date: 04/28/2021   PT End of Session - 04/28/21 3361     Visit Number 10    Number of Visits 17    Date for PT Re-Evaluation 05/14/21    Authorization Type [employer]; $50 copay for each discipline.  VL: 60, 8 used.  If she sees all three in same day, 3 visits used    Authorization - Visit Number 27   number corrected   Authorization - Number of Visits 60    PT Start Time 1400    PT Stop Time 1445    PT Time Calculation (min) 45 min    Activity Tolerance Patient tolerated treatment well    Behavior During Therapy WFL for tasks assessed/performed             Past Medical History:  Diagnosis Date   Allergy    Anxiety    Arthritis    Expressive aphasia    Hemiparesis ([place])    Right side   High triglycerides    History of kidney stones    Kidney stones    Recurrent sinus infections    Stroke [facility])     Past Surgical History:  Procedure Laterality Date   ANTERIOR CRUCIATE LIGAMENT REPAIR Left    BUNIONECTOMY Right    CYSTOSCOPY/URETEROSCOPY/HOLMIUM LASER/STENT PLACEMENT Right 02/08/2021   Procedure: CYSTOSCOPY/RETROGRADE/URETEROSCOPY/HOLMIUM LASER/STENT PLACEMENT, NEPHROSTOMY TUBE REMOVAL;  Surgeon: Ceasar Mons, MD;  Location: [facility];  Service: Urology;  Laterality: Right;   IR NEPHROSTOMY PLACEMENT RIGHT  12/09/2020   LASIK     LITHOTRIPSY     MENISCUS REPAIR Left    NASAL SEPTUM SURGERY     SHOULDER SURGERY     VARICOSE VEIN SURGERY Left     Vitals:   04/28/21 1405  BP: 140/90     Subjective Assessment - 04/28/21 1404     Subjective Pt had appointment with physiatrist, recommending she follow up with PCP about  BP.  Recommending Neuropsych but pt shaking head "NO".    Pertinent History anxiety, migraines, HTN, OA, expressive aphasia, R hemiplegia, kidney stones, L ACL repair, R bunionectomy, L meniscus repair, shoulder surgery, left basal ganglia ICH 06/2019    Limitations Standing;Walking    Currently in Pain? No/denies              [facility] Adult PT Treatment/Exercise - 04/28/21 1433       Ambulation/Gait   Ambulation/Gait Yes    Ambulation/Gait Assistance 4: Min guard    Ambulation/Gait Assistance Details performed obstacle negotiation stepping over obstacles x 6 reps x 2 sets leading with RLE to focus on RLE hip flexion and clearance and then 2 sets leading with LLE to continue to facilitate increased stance on RLE and increased step length with LLE.  Changed to standing in // bars with staggered stance and RLE in hip extension; focused on blocked practice of weight shifting forwards to LLE and initiating R swing with hip and knee flexion to decrease use of hip hiking.  Carryover of technique to gait in // bars x 4 laps with cues for increased step length with LLE to increase R hip extension and initiation of swing phase with hip and knee flexion.  Ambulation Distance (Feet) 230 Feet    Assistive device Small based quad cane;Parallel bars    Ambulation Surface Level;Indoor      Knee/Hip Exercises: Aerobic   Other Aerobic Sci Fit x 8 min level 3: performed with bilat LE and LUE x 6 minutes and then legs only x final 2 min with PT stabilizing right leg to keep hip in neutral.                 Balance Exercises - 04/28/21 1711       Balance Exercises: Standing   Sit to Stand Standard surface;Upper extremity support;Limitations    Sit to Stand Limitations Sit <> Stand from mat with feet together - PT facilitating weight shift to midline during sit to stand and in standing; cues for slow controlled stand > sit to increase activation of RLE.  Performed x 8 reps.                   PT Short Term Goals - 04/15/21 1548       PT SHORT TERM GOAL #1   Title Pt will perform 2/6 minute walk test of endurance with quad cane    Baseline 03/22/21  230' on 6 min walk    Time 4    Period Weeks    Status Achieved    Target Date 04/14/21      PT SHORT TERM GOAL #2   Title Pt will re-initiate updated HEP with supervision    Baseline Pt has been instucted in HEP with caregiver assist.    Time 4    Period Weeks    Status Achieved    Target Date 04/14/21      PT SHORT TERM GOAL #3   Title Pt will increase BERG by 4 points to indicate decreased falls risk    Baseline 21/56. 04/14/21 30/56    Time 4    Period Weeks    Status Achieved    Target Date 04/14/21      PT SHORT TERM GOAL #4   Title Pt will increase gait velocity to 0.25 m/sec with LRAD and supervision    Baseline .18 m/sec with quad cane. 04/14/21 0.40m/s    Time 4    Period Weeks    Status Partially Met    Target Date 04/14/21               PT Long Term Goals - 04/25/21 1543       PT LONG TERM GOAL #1   Title Pt will be able to perform progressive HEP for strengthening, balance and mobility with assist of husband to continued gains at home. ( LTGs due 10/16)    Time 8    Period Weeks    Status New      PT LONG TERM GOAL #2   Title Pt will increase BERG balance to >/= 30/56 to indicate decreased falls risk    Baseline 21/56    Time 8    Period Weeks    Status New      PT LONG TERM GOAL #3   Title Pt will increase 6 min walk to >300' for improved mobility and activity tolerance.    Baseline 03/22/21 230'    Time 8    Period Weeks    Status Revised      PT LONG TERM GOAL #4   Title Pt will increase gait velocity to >/- 0.30 m/sec for improved household mobility with LRAD and supervision  Baseline .18 m/sec    Time 8    Period Weeks    Status New      PT LONG TERM GOAL #5   Title Pt will perform sit > stand MOD I with equal WB through R and LLE to indicate improved  functional strength in RLE for transfers    Baseline weight shifted to LLE only    Time 8    Period Weeks    Status New                   Plan - 04/28/21 1653     Clinical Impression Statement Continued to work on aerobic conditioning with use of SciFit; pt continues to decline use of treadmill.  Continued gait training with obstacles focusing on stepping over to facilitate increased safety, stance time on R and hip/knee flexion of R.  With full LLE step length pt able to initiate swing with R hip and knee flexion.  When performing sit > stand with narrow BOS pt demonstrated improved weight shift and use of RLE.    Personal Factors and Comorbidities Behavior Pattern;Comorbidity 3+;Past/Current Experience;Time since onset of injury/illness/exacerbation    Comorbidities anxiety, migraines, HTN, OA, expressive aphasia, R hemiplegia, kidney stones, L ACL repair, R bunionectomy, L meniscus repair, shoulder surgery, stroke    Examination-Activity Limitations Transfers;Stairs;Locomotion Level;Stand;Bend    Examination-Participation Restrictions Community Activity    Stability/Clinical Decision Making Evolving/Moderate complexity    Rehab Potential Good    PT Frequency 2x / week    PT Duration 8 weeks    PT Treatment/Interventions ADLs/Self Care Home Management;DME Instruction;Therapeutic activities;Functional mobility training;Stair training;Gait training;Therapeutic exercise;Balance training;Neuromuscular re-education;Patient/family education;Manual techniques;Passive range of motion;Orthotic Fit/Training    PT Next Visit Plan Monitor BP closesly.  If agreeable, Continue BWS over treadmill with ~30# unweighted or endurance on SciFit.  Performed at 0. with medium harness.  Sit<>Stand with feet together for weight through RLE.  Gait training with SBQC.    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 [place], [place], [address] Phone: [phone]   Fax:  [phone]  Name: Melanie Madden MRN: [id number] Date of Birth: [birth year]

## 2021-04-28 NOTE — Patient Instructions (Signed)
You need to see PCP Dr Ernie Hew for BP management

## 2021-05-02 ENCOUNTER — Ambulatory Visit: Payer: Managed Care, Other (non HMO) | Attending: Family Medicine | Admitting: Physical Therapy

## 2021-05-02 ENCOUNTER — Other Ambulatory Visit: Payer: Self-pay

## 2021-05-02 VITALS — BP 160/98

## 2021-05-02 DIAGNOSIS — R208 Other disturbances of skin sensation: Secondary | ICD-10-CM | POA: Diagnosis present

## 2021-05-02 DIAGNOSIS — M6281 Muscle weakness (generalized): Secondary | ICD-10-CM | POA: Diagnosis present

## 2021-05-02 DIAGNOSIS — R2689 Other abnormalities of gait and mobility: Secondary | ICD-10-CM | POA: Diagnosis not present

## 2021-05-02 DIAGNOSIS — R293 Abnormal posture: Secondary | ICD-10-CM | POA: Insufficient documentation

## 2021-05-02 DIAGNOSIS — R2681 Unsteadiness on feet: Secondary | ICD-10-CM | POA: Diagnosis present

## 2021-05-02 DIAGNOSIS — I69151 Hemiplegia and hemiparesis following nontraumatic intracerebral hemorrhage affecting right dominant side: Secondary | ICD-10-CM | POA: Insufficient documentation

## 2021-05-03 NOTE — Therapy (Signed)
[facility] [address] [place], [place], [address] Phone: [phone]   Fax:  [phone]  Physical Therapy Treatment  Patient Details  Name: Melanie Madden MRN: [id number] Date of Birth: Aug 02, 1965 Referring Provider (PT): Charlett Blake, MD   Encounter Date: 05/02/2021   PT End of Session - 05/02/21 1321     Visit Number 11    Number of Visits 17    Date for PT Re-Evaluation 05/14/21    Authorization Type [employer]; $50 copay for each discipline.  VL: 60, 8 used.  If she sees all three in same day, 3 visits used    Authorization - Visit Number 28   number corrected   Authorization - Number of Visits 60    PT Start Time 1318    PT Stop Time 1400    PT Time Calculation (min) 42 min    Activity Tolerance Patient tolerated treatment well    Behavior During Therapy WFL for tasks assessed/performed             Past Medical History:  Diagnosis Date   Allergy    Anxiety    Arthritis    Expressive aphasia    Hemiparesis ([place])    Right side   High triglycerides    History of kidney stones    Kidney stones    Recurrent sinus infections    Stroke [facility])     Past Surgical History:  Procedure Laterality Date   ANTERIOR CRUCIATE LIGAMENT REPAIR Left    BUNIONECTOMY Right    CYSTOSCOPY/URETEROSCOPY/HOLMIUM LASER/STENT PLACEMENT Right 02/08/2021   Procedure: CYSTOSCOPY/RETROGRADE/URETEROSCOPY/HOLMIUM LASER/STENT PLACEMENT, NEPHROSTOMY TUBE REMOVAL;  Surgeon: Ceasar Mons, MD;  Location: [facility];  Service: Urology;  Laterality: Right;   IR NEPHROSTOMY PLACEMENT RIGHT  12/09/2020   LASIK     LITHOTRIPSY     MENISCUS REPAIR Left    NASAL SEPTUM SURGERY     SHOULDER SURGERY     VARICOSE VEIN SURGERY Left     Vitals:   05/02/21 1325  BP: (!) 160/98     Subjective Assessment - 05/02/21 1325     Subjective Aide remained in waiting area today; No issues to report.  Agreeable to use  treadmill again.    Pertinent History anxiety, migraines, HTN, OA, expressive aphasia, R hemiplegia, kidney stones, L ACL repair, R bunionectomy, L meniscus repair, shoulder surgery, left basal ganglia ICH 06/2019    Limitations Standing;Walking    Currently in Pain? No/denies              [facility] Adult PT Treatment/Exercise - 05/03/21 1107       Bed Mobility   Bed Mobility Sit to Supine;Supine to Sit    Supine to Sit Contact Guard/Touching assist    Sit to Supine Contact Guard/Touching assist      Transfers   Transfers Sit to Stand;Stand to Sit    Sit to Stand 4: Min guard    Stand to Sit 4: Min guard    Number of Reps 10 reps    Transfer Cueing Continued sit <> stand training with feet together to increase weight shift and WB through RLE.  Continued to provide min A for full weight shift and stability once standing but pt demonstrated improved equal WB and improved ability to maintain R foot position on floor.      Ambulation/Gait   Ambulation/Gait Yes    Ambulation/Gait Assistance 3: Mod assist    Ambulation/Gait Assistance  Details 6 minutes on treadmill at 0.6 mph with therapist providing mod A to maintain RUE position on handle and facilitating increased hip extension in RLE during terminal swing, initiation of swing with hip and knee flexion, RLE clearance during swing and increased stance time on RLE.  Pt able to initiate swing with hip and knee flexion 50% of the time but still demonstrates decreased stance time.    Assistive device Body weight support system    Gait Pattern Step-through pattern;Decreased step length - left;Decreased stance time - right;Decreased stride length;Decreased hip/knee flexion - right;Decreased dorsiflexion - right;Decreased weight shift to right;Right hip hike;Right genu recurvatum;Lateral hip instability;Trunk flexed    Ambulation Surface Other (comment)   treadmill     Knee/Hip Exercises: Seated   Sit to Sand 1 set;without UE support   feet  together, therapist assisting with weight shift to R     Knee/Hip Exercises: Supine   Bridges Strengthening;Both;1 set;5 reps    Bridges Limitations followed by R single leg bridge    Single Leg Bridge Strengthening;Right;1 set;5 reps;Limitations   holding L knee to chest   Straight Leg Raises Strengthening;Right;1 set;Limitations   8 reps AAROM             PT Short Term Goals - 04/15/21 1548       PT SHORT TERM GOAL #1   Title Pt will perform 2/6 minute walk test of endurance with quad cane    Baseline 03/22/21  230' on 6 min walk    Time 4    Period Weeks    Status Achieved    Target Date 04/14/21      PT SHORT TERM GOAL #2   Title Pt will re-initiate updated HEP with supervision    Baseline Pt has been instucted in HEP with caregiver assist.    Time 4    Period Weeks    Status Achieved    Target Date 04/14/21      PT SHORT TERM GOAL #3   Title Pt will increase BERG by 4 points to indicate decreased falls risk    Baseline 21/56. 04/14/21 30/56    Time 4    Period Weeks    Status Achieved    Target Date 04/14/21      PT SHORT TERM GOAL #4   Title Pt will increase gait velocity to 0.25 m/sec with LRAD and supervision    Baseline .18 m/sec with quad cane. 04/14/21 0.57m/s    Time 4    Period Weeks    Status Partially Met    Target Date 04/14/21               PT Long Term Goals - 04/25/21 1543       PT LONG TERM GOAL #1   Title Pt will be able to perform progressive HEP for strengthening, balance and mobility with assist of husband to continued gains at home. ( LTGs due 10/16)    Time 8    Period Weeks    Status New      PT LONG TERM GOAL #2   Title Pt will increase BERG balance to >/= 30/56 to indicate decreased falls risk    Baseline 21/56    Time 8    Period Weeks    Status New      PT LONG TERM GOAL #3   Title Pt will increase 6 min walk to >300' for improved mobility and activity tolerance.    Baseline 03/22/21 230'  Time 8    Period Weeks     Status Revised      PT LONG TERM GOAL #4   Title Pt will increase gait velocity to >/- 0.30 m/sec for improved household mobility with LRAD and supervision    Baseline .18 m/sec    Time 8    Period Weeks    Status New      PT LONG TERM GOAL #5   Title Pt will perform sit > stand MOD I with equal WB through R and LLE to indicate improved functional strength in RLE for transfers    Baseline weight shifted to LLE only    Time 8    Period Weeks    Status New                   Plan - 05/03/21 1058     Clinical Impression Statement Pt agreeable to return to use of treadmill with BWS to continue to address functional endurance and gait training.  Continued to focus on increasing weight shift, WB, stance time and activation of RLE during sit > stand transfers and gait. Pt is making good progress and anticipate pt will be ready for D/C by end of certification period.    Personal Factors and Comorbidities Behavior Pattern;Comorbidity 3+;Past/Current Experience;Time since onset of injury/illness/exacerbation    Comorbidities anxiety, migraines, HTN, OA, expressive aphasia, R hemiplegia, kidney stones, L ACL repair, R bunionectomy, L meniscus repair, shoulder surgery, stroke    Examination-Activity Limitations Transfers;Stairs;Locomotion Level;Stand;Bend    Examination-Participation Restrictions Community Activity    Stability/Clinical Decision Making Evolving/Moderate complexity    Rehab Potential Good    PT Frequency 2x / week    PT Duration 8 weeks    PT Treatment/Interventions ADLs/Self Care Home Management;DME Instruction;Therapeutic activities;Functional mobility training;Stair training;Gait training;Therapeutic exercise;Balance training;Neuromuscular re-education;Patient/family education;Manual techniques;Passive range of motion;Orthotic Fit/Training    PT Next Visit Plan Monitor BP closesly.  If agreeable, Continue BWS over treadmill with ~30# unweighted or endurance on SciFit.   Performed at 0.58mph with medium harness.  Sit<>Stand with feet together for increased weight through RLE.  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 [place], [place], [address] Phone: [phone]   Fax:  [phone]  Name: Kamerin Axford MRN: [id number] Date of Birth: 1966-01-21

## 2021-05-05 ENCOUNTER — Ambulatory Visit: Payer: Managed Care, Other (non HMO)

## 2021-05-05 ENCOUNTER — Other Ambulatory Visit: Payer: Self-pay

## 2021-05-05 VITALS — BP 142/96

## 2021-05-05 DIAGNOSIS — M6281 Muscle weakness (generalized): Secondary | ICD-10-CM

## 2021-05-05 DIAGNOSIS — I69151 Hemiplegia and hemiparesis following nontraumatic intracerebral hemorrhage affecting right dominant side: Secondary | ICD-10-CM

## 2021-05-05 DIAGNOSIS — R2689 Other abnormalities of gait and mobility: Secondary | ICD-10-CM | POA: Diagnosis not present

## 2021-05-06 NOTE — Therapy (Signed)
[facility] [address] [place], [place], [address] Phone: [phone]   Fax:  [phone]  Physical Therapy Treatment  Patient Details  Name: Melanie Madden MRN: [id number] Date of Birth: 1966-02-02 Referring Provider (PT): Charlett Blake, MD   Encounter Date: 05/05/2021   PT End of Session - 05/05/21 1320     Visit Number 12    Number of Visits 17    Date for PT Re-Evaluation 05/14/21    Authorization Type [employer]; $50 copay for each discipline.  VL: 60, 8 used.  If she sees all three in same day, 3 visits used    Authorization - Visit Number 28   number corrected   Authorization - Number of Visits 8    PT Start Time 1318    PT Stop Time 1358    PT Time Calculation (min) 40 min    Activity Tolerance Patient tolerated treatment well    Behavior During Therapy WFL for tasks assessed/performed             Past Medical History:  Diagnosis Date   Allergy    Anxiety    Arthritis    Expressive aphasia    Hemiparesis ([place])    Right side   High triglycerides    History of kidney stones    Kidney stones    Recurrent sinus infections    Stroke [facility])     Past Surgical History:  Procedure Laterality Date   ANTERIOR CRUCIATE LIGAMENT REPAIR Left    BUNIONECTOMY Right    CYSTOSCOPY/URETEROSCOPY/HOLMIUM LASER/STENT PLACEMENT Right 02/08/2021   Procedure: CYSTOSCOPY/RETROGRADE/URETEROSCOPY/HOLMIUM LASER/STENT PLACEMENT, NEPHROSTOMY TUBE REMOVAL;  Surgeon: Ceasar Mons, MD;  Location: [facility];  Service: Urology;  Laterality: Right;   IR NEPHROSTOMY PLACEMENT RIGHT  12/09/2020   LASIK     LITHOTRIPSY     MENISCUS REPAIR Left    NASAL SEPTUM SURGERY     SHOULDER SURGERY     VARICOSE VEIN SURGERY Left     Vitals:   05/05/21 1321  BP: (!) 142/96     Subjective Assessment - 05/05/21 1321     Subjective Aide remained in waiting area today. No issues to report. Pt denied any  problem since last session.    Pertinent History anxiety, migraines, HTN, OA, expressive aphasia, R hemiplegia, kidney stones, L ACL repair, R bunionectomy, L meniscus repair, shoulder surgery, left basal ganglia ICH 06/2019    Limitations Standing;Walking    Currently in Pain? No/denies                               [facility] Adult PT Treatment/Exercise - 05/05/21 1325       Ambulation/Gait   Ambulation/Gait Yes    Ambulation/Gait Assistance 5: Supervision;4: Min guard    Ambulation/Gait Assistance Details Pt ambulated around obstacles over 3 yard sticks and over red mat x 6 bouts during walk. Verbal cues to try to increase left step length. Pt was actually safer stepping up small 2" lip of red mat with leading with RLE with circumducting up. When tried with LLE she would get toes of right foot caught.    Ambulation Distance (Feet) 230 Feet    Assistive device Small based quad cane    Gait Pattern Step-to pattern;Step-through pattern;Decreased arm swing - right;Decreased hip/knee flexion - right;Decreased stance time - right;Decreased step length - left;Right genu recurvatum    Ambulation Surface  Level;Indoor    Stairs Yes    Stairs Assistance 5: Supervision    Stair Management Technique Step to pattern;One rail Left    Number of Stairs 4    Height of Stairs 6    Gait Comments BWS over treadmill x 6 min at 0.23mph with pt unweighted ~30#. Right hand strapped to handle with mitt and PT also being sure it does not slide down. PT assisted mod assist at RLE to initiate right hip/knee flexion during swing to decrease circumduction. Verbal cues to slow left step to increase right stance time. BP=158/98 after                     PT Education - 05/06/21 1252     Education Details PT discussed planned discharge end of next week and pt nodded in agreement. PT let aide know she would also reach out to pt's husband to see if he had any questions.    Person(s) Educated  Patient;Caregiver(s)    Methods Explanation    Comprehension Verbalized understanding              PT Short Term Goals - 04/15/21 1548       PT SHORT TERM GOAL #1   Title Pt will perform 2/6 minute walk test of endurance with quad cane    Baseline 03/22/21  230' on 6 min walk    Time 4    Period Weeks    Status Achieved    Target Date 04/14/21      PT SHORT TERM GOAL #2   Title Pt will re-initiate updated HEP with supervision    Baseline Pt has been instucted in HEP with caregiver assist.    Time 4    Period Weeks    Status Achieved    Target Date 04/14/21      PT SHORT TERM GOAL #3   Title Pt will increase BERG by 4 points to indicate decreased falls risk    Baseline 21/56. 04/14/21 30/56    Time 4    Period Weeks    Status Achieved    Target Date 04/14/21      PT SHORT TERM GOAL #4   Title Pt will increase gait velocity to 0.25 m/sec with LRAD and supervision    Baseline .18 m/sec with quad cane. 04/14/21 0.51m/s    Time 4    Period Weeks    Status Partially Met    Target Date 04/14/21               PT Long Term Goals - 04/25/21 1543       PT LONG TERM GOAL #1   Title Pt will be able to perform progressive HEP for strengthening, balance and mobility with assist of husband to continued gains at home. ( LTGs due 10/16)    Time 8    Period Weeks    Status New      PT LONG TERM GOAL #2   Title Pt will increase BERG balance to >/= 30/56 to indicate decreased falls risk    Baseline 21/56    Time 8    Period Weeks    Status New      PT LONG TERM GOAL #3   Title Pt will increase 6 min walk to >300' for improved mobility and activity tolerance.    Baseline 03/22/21 230'    Time 8    Period Weeks    Status Revised  PT LONG TERM GOAL #4   Title Pt will increase gait velocity to >/- 0.30 m/sec for improved household mobility with LRAD and supervision    Baseline .18 m/sec    Time 8    Period Weeks    Status New      PT LONG TERM GOAL #5   Title  Pt will perform sit > stand MOD I with equal WB through R and LLE to indicate improved functional strength in RLE for transfers    Baseline weight shifted to LLE only    Time 8    Period Weeks    Status New                   Plan - 05/06/21 1253     Clinical Impression Statement PT continued to focus on gait quality with trying to increase right stance time in BWS with less circumduction. Overground pt still having to circumduct to clear but is demonstrating more upright posture. PT discussed with pt plan to discharge end of next week.    Personal Factors and Comorbidities Behavior Pattern;Comorbidity 3+;Past/Current Experience;Time since onset of injury/illness/exacerbation    Comorbidities anxiety, migraines, HTN, OA, expressive aphasia, R hemiplegia, kidney stones, L ACL repair, R bunionectomy, L meniscus repair, shoulder surgery, stroke    Examination-Activity Limitations Transfers;Stairs;Locomotion Level;Stand;Bend    Examination-Participation Restrictions Community Activity    Stability/Clinical Decision Making Evolving/Moderate complexity    Rehab Potential Good    PT Frequency 2x / week    PT Duration 8 weeks    PT Treatment/Interventions ADLs/Self Care Home Management;DME Instruction;Therapeutic activities;Functional mobility training;Stair training;Gait training;Therapeutic exercise;Balance training;Neuromuscular re-education;Patient/family education;Manual techniques;Passive range of motion;Orthotic Fit/Training    PT Next Visit Plan Begin to check LTGs for planned discharge end of next week. Monitor BP closesly.  If agreeable, Continue BWS over treadmill with ~30# unweighted or endurance on [employer].  Performed at 0.82mph with medium harness.  Sit<>Stand with feet together for increased weight through RLE.  Gait training with [facility], stepping over obstacles    PT Home Exercise Plan Access Code: [email or web address]  URL: [email or web address]    Consulted and Agree with Plan  of Care Patient             Patient will benefit from skilled therapeutic intervention in order to improve the following deficits and impairments:  Abnormal gait, Decreased activity tolerance, Decreased balance, Decreased endurance, Decreased mobility, Decreased strength, Difficulty walking, Impaired sensation, Impaired tone, Postural dysfunction  Visit Diagnosis: Other abnormalities of gait and mobility  Muscle weakness (generalized)  Hemiplegia and hemiparesis following nontraumatic intracerebral hemorrhage affecting right dominant side [facility])     Problem List Patient Active Problem List   Diagnosis Date Noted   Urinary retention    E. 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 [place], [place], [address] Phone: [phone]   Fax:  [phone]  Name: Melanie Madden MRN: [id number] Date of Birth: 1965-08-27

## 2021-05-09 ENCOUNTER — Ambulatory Visit: Payer: Managed Care, Other (non HMO)

## 2021-05-09 ENCOUNTER — Other Ambulatory Visit: Payer: Self-pay

## 2021-05-09 VITALS — BP 140/86

## 2021-05-09 DIAGNOSIS — M6281 Muscle weakness (generalized): Secondary | ICD-10-CM

## 2021-05-09 DIAGNOSIS — R2689 Other abnormalities of gait and mobility: Secondary | ICD-10-CM | POA: Diagnosis not present

## 2021-05-09 DIAGNOSIS — I69151 Hemiplegia and hemiparesis following nontraumatic intracerebral hemorrhage affecting right dominant side: Secondary | ICD-10-CM

## 2021-05-09 NOTE — Therapy (Signed)
[facility] [address] [place], [place], [address] Phone: [phone]   Fax:  [phone]  Physical Therapy Treatment  Patient Details  Name: Melanie Madden MRN: [id number] Date of Birth: [birth year] Referring Provider (PT): Charlett Blake, MD   Encounter Date: 05/09/2021   PT End of Session - 05/09/21 1319     Visit Number 13    Number of Visits 17    Date for PT Re-Evaluation 05/14/21    Authorization Type [employer]; $50 copay for each discipline.  VL: 60, 8 used.  If she sees all three in same day, 3 visits used    Authorization - Visit Number 28   number corrected   Authorization - Number of Visits 60    PT Start Time 1315    PT Stop Time 1358    PT Time Calculation (min) 43 min    Activity Tolerance Patient tolerated treatment well    Behavior During Therapy WFL for tasks assessed/performed             Past Medical History:  Diagnosis Date   Allergy    Anxiety    Arthritis    Expressive aphasia    Hemiparesis ([place])    Right side   High triglycerides    History of kidney stones    Kidney stones    Recurrent sinus infections    Stroke [facility])     Past Surgical History:  Procedure Laterality Date   ANTERIOR CRUCIATE LIGAMENT REPAIR Left    BUNIONECTOMY Right    CYSTOSCOPY/URETEROSCOPY/HOLMIUM LASER/STENT PLACEMENT Right 02/08/2021   Procedure: CYSTOSCOPY/RETROGRADE/URETEROSCOPY/HOLMIUM LASER/STENT PLACEMENT, NEPHROSTOMY TUBE REMOVAL;  Surgeon: Ceasar Mons, MD;  [facility];  Service: Urology;  Laterality: Right;   IR NEPHROSTOMY PLACEMENT RIGHT  12/09/2020   LASIK     LITHOTRIPSY     MENISCUS REPAIR Left    NASAL SEPTUM SURGERY     SHOULDER SURGERY     VARICOSE VEIN SURGERY Left     Vitals:   05/09/21 1321  BP: 140/86     Subjective Assessment - 05/09/21 1319     Subjective Pt denies any new issues. Understands that she finishes Friday nodding her  head.    Pertinent History anxiety, migraines, HTN, OA, expressive aphasia, R hemiplegia, kidney stones, L ACL repair, R bunionectomy, L meniscus repair, shoulder surgery, left basal ganglia ICH 06/2019    Limitations Standing;Walking    Currently in Pain? No/denies                [facility] PT Assessment - 05/09/21 1324       6 Minute Walk- Baseline   BP (mmHg) 140/86      6 Minute walk- Post Test   BP (mmHg) 158/90      6 minute walk test results    Aerobic Endurance Distance Walked 230                           [facility] Adult PT Treatment/Exercise - 05/09/21 1324       Transfers   Transfers Sit to Stand;Stand to Sit    Sit to Stand 6: Modified independent (Device/Increase time)    Sit to Stand Details (indicate cue type and reason) Pt has decreased weight shift to right.    Stand to Sit 6: Modified independent (Device/Increase time)      Ambulation/Gait   Ambulation/Gait Yes    Ambulation/Gait Assistance  5: Supervision    Ambulation/Gait Assistance Details during 6 min walk. Pt was given cues to increase left step length.    Ambulation Distance (Feet) 230 Feet    Assistive device Small based quad cane   right AFO   Gait Pattern Step-to pattern;Step-through pattern;Decreased stance time - right;Decreased hip/knee flexion - right;Right circumduction    Ambulation Surface Indoor      Exercises   Exercises Other Exercises    Other Exercises  PT reviewed HEP with patient as noted below.            Exercises Bridge with Arms at [employer] and Feet on Swiss Ball - 1 x daily - 7 x weekly - 2 sets - 10 reps  -PT stabilizing RLE on ball with verbal cues to lift right pelvis more Supine Single Leg Hip and Knee Flexion ROM with Swiss Ball - 1 x daily - 7 x weekly - 2 sets - 10 reps  - PT stabilizing RLE and providing some resistance in to extension. Hip Abduction and Adduction Caregiver PROM - 1 x daily - 7 x weekly - 2 sets - 10 reps- support under heel to keep  foot straight. Sit to Stand - 1 x daily - 7 x weekly - 2 sets - 5 reps Side to Side Weight Shift with Counter Support - 1 x daily - 7 x weekly - 2 sets - 10 reps Seated Pelvic Tilt on Balance Disk - 1 x daily - 7 x weekly - 3 sets - 10 reps Seated lateral pelvic tilt on pillow - 1 x daily - 7 x weekly - 3 sets - 10 reps         PT Education - 05/09/21 2009     Education Details Reviewed HEP. Discussed d/c next visit.    Person(s) Educated Patient    Methods Explanation    Comprehension Verbalized understanding              PT Short Term Goals - 04/15/21 1548       PT SHORT TERM GOAL #1   Title Pt will perform 2/6 minute walk test of endurance with quad cane    Baseline 03/22/21  230' on 6 min walk    Time 4    Period Weeks    Status Achieved    Target Date 04/14/21      PT SHORT TERM GOAL #2   Title Pt will re-initiate updated HEP with supervision    Baseline Pt has been instucted in HEP with caregiver assist.    Time 4    Period Weeks    Status Achieved    Target Date 04/14/21      PT SHORT TERM GOAL #3   Title Pt will increase BERG by 4 points to indicate decreased falls risk    Baseline 21/56. 04/14/21 30/56    Time 4    Period Weeks    Status Achieved    Target Date 04/14/21      PT SHORT TERM GOAL #4   Title Pt will increase gait velocity to 0.25 m/sec with LRAD and supervision    Baseline .18 m/sec with quad cane. 04/14/21 0.69m/s    Time 4    Period Weeks    Status Partially Met    Target Date 04/14/21               PT Long Term Goals - 05/09/21 2010       PT LONG TERM GOAL #1  Title Pt will be able to perform progressive HEP for strengthening, balance and mobility with assist of husband to continued gains at home. ( LTGs due 10/16)    Baseline 05/09/21 PT reviewed HEP with pt. Advised that she would need assist with physioball exercises to stabilize right leg. Caregiver has been trained prior.    Time 8    Period Weeks    Status  Achieved      PT LONG TERM GOAL #2   Title Pt will increase BERG balance to >/= 30/56 to indicate decreased falls risk    Baseline 21/56    Time 8    Period Weeks    Status New      PT LONG TERM GOAL #3   Title Pt will increase 6 min walk to >300' for improved mobility and activity tolerance.    Baseline 03/22/21 230'. 05/09/21 230' so no change    Time 8    Period Weeks    Status Not Met      PT LONG TERM GOAL #4   Title Pt will increase gait velocity to >/- 0.30 m/sec for improved household mobility with LRAD and supervision    Baseline .18 m/sec    Time 8    Period Weeks    Status New      PT LONG TERM GOAL #5   Title Pt will perform sit > stand MOD I with equal WB through R and LLE to indicate improved functional strength in RLE for transfers    Baseline weight shifted to LLE only    Time 8    Period Weeks    Status New                   Plan - 05/09/21 2011     Clinical Impression Statement PT started checking LTGs for planned d/c next visit. Pt met HEP goal that caregivers will need to assist with. She had no change in 6 min walk distance so did not meet that goal. She continues to work more on upright posture with activities increasing right weight shift.    Personal Factors and Comorbidities Behavior Pattern;Comorbidity 3+;Past/Current Experience;Time since onset of injury/illness/exacerbation    Comorbidities anxiety, migraines, HTN, OA, expressive aphasia, R hemiplegia, kidney stones, L ACL repair, R bunionectomy, L meniscus repair, shoulder surgery, stroke    Examination-Activity Limitations Transfers;Stairs;Locomotion Level;Stand;Bend    Examination-Participation Restrictions Community Activity    Stability/Clinical Decision Making Evolving/Moderate complexity    Rehab Potential Good    PT Frequency 2x / week    PT Duration 8 weeks    PT Treatment/Interventions ADLs/Self Care Home Management;DME Instruction;Therapeutic activities;Functional mobility  training;Stair training;Gait training;Therapeutic exercise;Balance training;Neuromuscular re-education;Patient/family education;Manual techniques;Passive range of motion;Orthotic Fit/Training    PT Next Visit Plan Check remaining LTGs for discharge next visit.    PT Home Exercise Plan Access Code: [email or web address]  URL: [email or web address]    Consulted and Agree with Plan of Care Patient             Patient will benefit from skilled therapeutic intervention in order to improve the following deficits and impairments:  Abnormal gait, Decreased activity tolerance, Decreased balance, Decreased endurance, Decreased mobility, Decreased strength, Difficulty walking, Impaired sensation, Impaired tone, Postural dysfunction  Visit Diagnosis: Other abnormalities of gait and mobility  Muscle weakness (generalized)  Hemiplegia and hemiparesis following nontraumatic intracerebral hemorrhage affecting right dominant side [facility])     Problem List Patient Active Problem List   Diagnosis Date Noted  Urinary retention    E. 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 [place], [place], [address] Phone: [phone]   Fax:  [phone]  Name: Jazma Pickel MRN: [id number] Date of Birth: [birth year]

## 2021-05-09 NOTE — Patient Instructions (Signed)
Access Code: [email or web address] URL: [email or web address] Date: 05/09/2021 Prepared by: Cherly Anderson  Exercises Bridge with Arms at [facility] and Feet on Swiss Ball - 1 x daily - 7 x weekly - 2 sets - 10 reps Supine Single Leg Hip and Knee Flexion ROM with Swiss Ball - 1 x daily - 7 x weekly - 2 sets - 10 reps Hip Abduction and Adduction Caregiver PROM - 1 x daily - 7 x weekly - 2 sets - 10 reps Sit to Stand - 1 x daily - 7 x weekly - 2 sets - 5 reps Side to Side Weight Shift with Counter Support - 1 x daily - 7 x weekly - 2 sets - 10 reps Seated Pelvic Tilt on Balance Disk - 1 x daily - 7 x weekly - 3 sets - 10 reps Seated lateral pelvic tilt on pillow - 1 x daily - 7 x weekly - 3 sets - 10 reps

## 2021-05-12 ENCOUNTER — Ambulatory Visit: Payer: Managed Care, Other (non HMO) | Admitting: Physical Therapy

## 2021-05-12 ENCOUNTER — Other Ambulatory Visit: Payer: Self-pay

## 2021-05-12 VITALS — BP 154/97 | HR 101

## 2021-05-12 DIAGNOSIS — R2689 Other abnormalities of gait and mobility: Secondary | ICD-10-CM

## 2021-05-12 DIAGNOSIS — I69151 Hemiplegia and hemiparesis following nontraumatic intracerebral hemorrhage affecting right dominant side: Secondary | ICD-10-CM

## 2021-05-12 DIAGNOSIS — R208 Other disturbances of skin sensation: Secondary | ICD-10-CM

## 2021-05-12 DIAGNOSIS — M6281 Muscle weakness (generalized): Secondary | ICD-10-CM

## 2021-05-12 DIAGNOSIS — R293 Abnormal posture: Secondary | ICD-10-CM

## 2021-05-12 DIAGNOSIS — R2681 Unsteadiness on feet: Secondary | ICD-10-CM

## 2021-05-12 NOTE — Patient Instructions (Signed)
Access Code: [email or web address] URL: [email or web address] Date: 05/12/2021 Prepared by: [place] with Arms at [employer] and Feet on [employer] - 1 x daily - 7 x weekly - 2 sets - 10 reps Supine Single Leg Hip and Knee Flexion ROM with Swiss Ball - 1 x daily - 7 x weekly - 2 sets - 10 reps Hip Abduction and Adduction Caregiver PROM - 1 x daily - 7 x weekly - 2 sets - 10 reps Sit to Stand - 1 x daily - 7 x weekly - 2 sets - 5 reps Side to Side Weight Shift with Counter Support - 1 x daily - 7 x weekly - 2 sets - 10 reps Seated Pelvic Tilt on Balance Disk - 1 x daily - 7 x weekly - 3 sets - 10 reps Seated lateral pelvic tilt on pillow - 1 x daily - 7 x weekly - 3 sets - 10 reps

## 2021-05-14 ENCOUNTER — Encounter: Payer: Self-pay | Admitting: Physical Therapy

## 2021-05-14 NOTE — Therapy (Signed)
[facility] [address] [place], [place], [address] Phone: [phone]   Fax:  [phone]  Physical Therapy Treatment and D/C Summary  Patient Details  Name: Melanie Madden MRN: [id number] Date of Birth: [birth year] Referring Provider (PT): Charlett Blake, MD   Encounter Date: 05/12/2021    05/12/21 1322  PT Visits / Re-Eval  Visit Number 14  Number of Visits 17  Date for PT Re-Evaluation 05/14/21  Authorization  Authorization Type [employer]; $50 copay for each discipline.  VL: 60, 8 used.  If she sees all three in same day, 3 visits used  Authorization - Visit Number 31 (number corrected)  Authorization - Number of Visits 60  PT Time Calculation  PT Start Time 1317  PT Stop Time 1400  PT Time Calculation (min) 43 min  PT - End of Session  Activity Tolerance Patient tolerated treatment well  Behavior During Therapy  (tearful)    Past Medical History:  Diagnosis Date   Allergy    Anxiety    Arthritis    Expressive aphasia    Hemiparesis ([place])    Right side   High triglycerides    History of kidney stones    Kidney stones    Recurrent sinus infections    Stroke [facility])     Past Surgical History:  Procedure Laterality Date   ANTERIOR CRUCIATE LIGAMENT REPAIR Left    BUNIONECTOMY Right    CYSTOSCOPY/URETEROSCOPY/HOLMIUM LASER/STENT PLACEMENT Right 02/08/2021   Procedure: CYSTOSCOPY/RETROGRADE/URETEROSCOPY/HOLMIUM LASER/STENT PLACEMENT, NEPHROSTOMY TUBE REMOVAL;  Surgeon: Ceasar Mons, MD;  Location: [facility];  Service: Urology;  Laterality: Right;   IR NEPHROSTOMY PLACEMENT RIGHT  12/09/2020   LASIK     LITHOTRIPSY     MENISCUS REPAIR Left    NASAL SEPTUM SURGERY     SHOULDER SURGERY     VARICOSE VEIN SURGERY Left     Vitals:   05/12/21 1324  BP: (!) 154/97  Pulse: (!) 101      05/12/21 1331  Symptoms/Limitations  Subjective Pt sad about last day in PT; still  asking about sensory and motor return in RUE and RLE  Pertinent History anxiety, migraines, HTN, OA, expressive aphasia, R hemiplegia, kidney stones, L ACL repair, R bunionectomy, L meniscus repair, shoulder surgery, left basal ganglia ICH 06/2019  Limitations Standing;Walking  Pain Assessment  Currently in Pain? No/denies      05/12/21 1331  Transfers  Transfers Stand to Sit;Sit to Stand  Sit to Stand 4: Min assist  Sit to Stand Details (indicate cue type and reason) Reviewed sit <> stand exercise from mat with feet together x 10 reps with therapist providing facilitation of equal WB during sit > stand, during static standing and then during stand > sit.  Provided to pt for HEP  Stand to Sit 4: Min assist  Stand to Sit Details with feet together; cues and facilitation for slow controlled, stand > sit for increased RLE activation  Standardized Balance Assessment  Standardized Balance Assessment Berg Balance Test;10 meter walk test  10 Meter Walk with quad cane and AFO: 41 seconds or 0.8 ft/sec or .4msec.  Berg Balance Test  Sit to Stand 3  Standing Unsupported 3  Sitting with Back Unsupported but Feet Supported on Floor or Stool 4  Stand to Sit 1  Transfers 3  Standing Unsupported with Eyes Closed 3  Standing Unsupported with Feet Together 2  From Standing, Reach Forward with Outstretched Arm 3  From Standing Position, Pick up Object from Floor 3  From Standing Position, Turn to Look Behind Over each Shoulder 3  Turn 360 Degrees 0  Standing Unsupported, Alternately Place Feet on Step/Stool 1  Standing Unsupported, One Foot in Front 1  Standing on One Leg 0  Total Score 30  Berg comment: 30/56 increased from 21/56 but still high falls risk      PT Education - 05/14/21 1927     Education Details brain neuroplasticity and importance of continuing to practice and challenge RUE/RLE; areas of progress, D/C today    Person(s) Educated Patient    Methods Explanation     Comprehension Other (comment)   demonstrated understanding with head nods             PT Short Term Goals - 04/15/21 1548       PT SHORT TERM GOAL #1   Title Pt will perform 2/6 minute walk test of endurance with quad cane    Baseline 03/22/21  230' on 6 min walk    Time 4    Period Weeks    Status Achieved    Target Date 04/14/21      PT SHORT TERM GOAL #2   Title Pt will re-initiate updated HEP with supervision    Baseline Pt has been instucted in HEP with caregiver assist.    Time 4    Period Weeks    Status Achieved    Target Date 04/14/21      PT SHORT TERM GOAL #3   Title Pt will increase BERG by 4 points to indicate decreased falls risk    Baseline 21/56. 04/14/21 30/56    Time 4    Period Weeks    Status Achieved    Target Date 04/14/21      PT SHORT TERM GOAL #4   Title Pt will increase gait velocity to 0.25 m/sec with LRAD and supervision    Baseline .18 m/sec with quad cane. 04/14/21 0.76ms    Time 4    Period Weeks    Status Partially Met    Target Date 04/14/21               PT Long Term Goals - 05/14/21 1935       PT LONG TERM GOAL #1   Title Pt will be able to perform progressive HEP for strengthening, balance and mobility with assist of husband to continued gains at home. ( LTGs due 10/16)    Baseline 05/09/21 PT reviewed HEP with pt. Advised that she would need assist with physioball exercises to stabilize right leg. Caregiver has been trained prior.    Time 8    Period Weeks    Status Achieved      PT LONG TERM GOAL #2   Title Pt will increase BERG balance to >/= 30/56 to indicate decreased falls risk    Baseline 21/56 > 30/56    Time 8    Period Weeks    Status Achieved      PT LONG TERM GOAL #3   Title Pt will increase 6 min walk to >300' for improved mobility and activity tolerance.    Baseline 03/22/21 230'. 05/09/21 230' so no change    Time 8    Period Weeks    Status Not Met      PT LONG TERM GOAL #4   Title Pt will  increase gait velocity to >/- 0.30 m/sec for improved household mobility with LRAD and  supervision    Baseline .18 m/sec > .24 m/sec    Time 8    Period Weeks    Status Partially Met      PT LONG TERM GOAL #5   Title Pt will perform sit > stand MOD I with equal WB through R and LLE to indicate improved functional strength in RLE for transfers    Baseline sit > stand pt self selected method, no WB through RLE.  With feet together, more equal WB    Time 8    Period Weeks    Status Partially Met                   Plan - 05/14/21 1936     Clinical Impression Statement Completed assessment of progress towards LTG.  Pt has made steady progress and has met 2/5 LTG.  Pt is able to perform HEP with caregiver's assistance and demonstrates improvement in standing balance as indicated by increase in BERG score.  Pt partially met gait velocity goal and sit > stand goal.  Pt demonstrates improvement in gait velocity but not back to baseline at previous D/C (.30 m/sec).  Pt also continues to perform sit > stand with weight shifted fully to LLE but able to maintain more equal WB and activation through RLE when performing with feet together - provided to pt as part of ongoing HEP.  Pt continues to be discouraged about lack of return on R side; PT continued to encourage pt to continue to perform HEP and practice what she has learned in therapy for continued neuroplasticity.  Pt to D/C from therapy today.    Personal Factors and Comorbidities Behavior Pattern;Comorbidity 3+;Past/Current Experience;Time since onset of injury/illness/exacerbation    Comorbidities anxiety, migraines, HTN, OA, expressive aphasia, R hemiplegia, kidney stones, L ACL repair, R bunionectomy, L meniscus repair, shoulder surgery, stroke    Examination-Activity Limitations Transfers;Stairs;Locomotion Level;Stand;Bend    Examination-Participation Restrictions Community Activity    Stability/Clinical Decision Making --    Rehab  Potential --    PT Frequency --    PT Duration --    PT Treatment/Interventions ADLs/Self Care Home Management;DME Instruction;Therapeutic activities;Functional mobility training;Stair training;Gait training;Therapeutic exercise;Balance training;Neuromuscular re-education;Patient/family education;Manual techniques;Passive range of motion;Orthotic Fit/Training    PT Next Visit Plan D/C    PT Home Exercise Plan Access Code: [email or web address]  URL: [email or web address]    Consulted and Agree with Plan of Care Patient             Patient will benefit from skilled therapeutic intervention in order to improve the following deficits and impairments:  Abnormal gait, Decreased activity tolerance, Decreased balance, Decreased endurance, Decreased mobility, Decreased strength, Difficulty walking, Impaired sensation, Impaired tone, Postural dysfunction  Visit Diagnosis: Other abnormalities of gait and mobility  Muscle weakness (generalized)  Hemiplegia and hemiparesis following nontraumatic intracerebral hemorrhage affecting right dominant side (HCC)  Unsteadiness on feet  Abnormal posture  Other disturbances of skin sensation     Problem List Patient Active Problem List   Diagnosis Date Noted   Urinary retention    E. 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   Nontraumatic subcortical hemorrhage of left cerebral hemisphere ([place]) 08/27/2019   Acute blood loss anemia  Hypoalbuminemia due to protein-calorie malnutrition (HCC)    Thrombocytopenia ([place])    Dysphagia 07/29/2019   Infarction of left basal ganglia (HCC) 07/18/2019   Hypernatremia    Leukocytosis    Essential hypertension    Global aphasia    Cytotoxic brain edema ([place]) 07/10/2019    ICH (intracerebral hemorrhage) ([place]) 07/09/2019   Anxiety state 02/21/2015   Cephalalgia 12/21/2014   PHYSICAL THERAPY DISCHARGE SUMMARY  Visits from Start of Care: 14  Current functional level related to goals / functional outcomes: See LTG achievement and impression statement above   Remaining deficits: R hemiplegia, impaired standing balance, impaired gait   Education / Equipment: HEP   Patient agrees to discharge. Patient goals were partially met. Patient is being discharged due to meeting the stated rehab goals.   Rico Junker, PT, DPT 05/14/21    8:13 PM    [facility] [address] [place], [place], [address] Phone: [phone]   Fax:  [phone]  Name: Melanie Madden MRN: [id number] Date of Birth: 1966/05/28

## 2021-06-02 ENCOUNTER — Telehealth: Payer: Self-pay

## 2021-06-02 NOTE — Telephone Encounter (Signed)
Mr. Melanie Madden has been advised per Dr.Kirsteins reply.

## 2021-06-02 NOTE — Telephone Encounter (Signed)
Mr. Melanie Madden has an annual [employer] renewal form that needs to be completed ASAP ( in 7 days). Madiline has not seen her PCP recently. He wanted to know if you will be able to fill out the form?   Mr. Melanie Madden has been advised to call back with the form details.  Call back phone [phone].

## 2021-07-10 ENCOUNTER — Encounter: Payer: Self-pay | Admitting: Neurology

## 2021-07-10 ENCOUNTER — Ambulatory Visit: Payer: Managed Care, Other (non HMO) | Admitting: Neurology

## 2021-07-10 ENCOUNTER — Ambulatory Visit: Payer: Managed Care, Other (non HMO) | Admitting: Psychology

## 2021-07-10 DIAGNOSIS — I69320 Aphasia following cerebral infarction: Secondary | ICD-10-CM | POA: Diagnosis not present

## 2021-07-10 DIAGNOSIS — G8110 Spastic hemiplegia affecting unspecified side: Secondary | ICD-10-CM

## 2021-07-10 DIAGNOSIS — I610 Nontraumatic intracerebral hemorrhage in hemisphere, subcortical: Secondary | ICD-10-CM

## 2021-07-10 NOTE — Progress Notes (Signed)
[facility] [address] [place]. [place] [address] [phone]       OFFICE FOLLOW-UP NOTE  Ms. mirabel ahlgren de Glennie Hawk Date of Birth:  [birth year] Medical Record Number:  [id number]   HPI: Ms. Glennie Hawk is a 55 year old pleasant Caucasian lady seen today for initial office follow-up visit following hospital admission for intracerebral hemorrhage in December 2020.  History is obtained from the patient, husband and sister accompanying her for this visit and review of electronic medical records and opossum reviewed pertinent available imaging films in PACS.  She presented on 07/09/2019 to [facility] with right-sided weakness which began abruptly after she returned home from work.  Her mental status subsequently progressively declined and husband called 911.  Code stroke was activated and CT scan of the head on admission showed a large left basal ganglia hemorrhage with volume of 44 cubic cc with 5 mm rightward midline shift.  She was admitted to the ICU and blood pressure is tightly controlled.  MRI scan of the brain subsequently confirmed left lentiform nucleus hemorrhage with estimated volume of 53 cubic cc and mild 67 with a midline shift but no hydrocephalus.  MR angiogram was negative for large vessel stenosis or aneurysms.  2D echo showed normal ejection fraction.  LDL cholesterol was elevated 156 mg percent and hemoglobin A1c was 5.3.  Urine drug screen was negative.  Patient had initial dysarthria and dysphagia and difficulty swallowing and had a core track tube placed but subsequently improved patient condition gradually stabilized and he was transferred to inpatient rehab.  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  Patient was meant to follow-up with me but was emotionally quite labile and during scheduled visit she had to leave before I could see her.  She is currently living at home with her husband  and does have a daytime caregiver when he is awake.  She has significant expressive aphasia and dysarthria can communicate daily but has good comprehension and understanding.  She still has dense right hemiplegia with practical no movement in the right upper extremity.  She is able to walk with a right foot drop with AFO.  She has finished physical occupational therapy as well as speech therapy.  She does own exercises at home.  Blood pressure is usually well controlled though today it is borderline in office at 145/87.  She is on Zoloft for depression which is better controlled now.  Patient wants to get better and wants more speech therapy if possible. ROS:   14 system review of systems is positive for weakness, speech difficulties, difficulty walking and all other systems negative  PMH:  Past Medical History:  Diagnosis Date   Allergy    Anxiety    Arthritis    Expressive aphasia    Hemiparesis (HCC)    Right side   High triglycerides    History of kidney stones    Kidney stones    Recurrent sinus infections    Stroke [facility])     Social History:  Social History   Socioeconomic History   Marital status: Significant Other    Spouse name: Not on file   Number of children: Not on file   Years of education: Not on file   Highest education level: Not on file  Occupational History   Not on file  Tobacco Use   Smoking status: Former   Smokeless tobacco: Never  Vaping  Use   Vaping Use: Never used  Substance and Sexual Activity   Alcohol use: Not Currently   Drug use: No   Sexual activity: Yes    Partners: Male  Other Topics Concern   Not on file  Social History Narrative   Not on file   Social Determinants of Health   Financial Resource Strain: Not on file  Food Insecurity: Not on file  Transportation Needs: Not on file  Physical Activity: Not on file  Stress: Not on file  Social Connections: Not on file  Intimate Partner Violence: Not on file    Medications:    Current Outpatient Medications on File Prior to Visit  Medication Sig Dispense Refill   amLODipine (NORVASC) 10 MG tablet Take 10 mg by mouth daily.     atorvastatin (LIPITOR) 20 MG tablet Take 1 tablet (20 mg total) by mouth daily at 6 PM. 30 tablet 1   B Complex-C (B-COMPLEX WITH VITAMIN C) tablet Take 1 tablet by mouth daily.     CALCIUM PO Take 1 tablet by mouth daily.     cholecalciferol (VITAMIN D) 1000 units tablet Take 1,000 Units by mouth daily. 2,000 units daily     docusate sodium (COLACE) 100 MG capsule Take 2 capsules (200 mg total) by mouth daily. 10 capsule 0   HYDROcodone-acetaminophen (NORCO) 5-325 MG tablet Take 1 tablet by mouth every 4 (four) hours as needed for moderate pain. 20 tablet 0   magnesium 30 MG tablet Take 30 mg by mouth daily.     Omega-3 Fatty Acids (FISH OIL) 1200 MG CPDR Take 1 capsule by mouth daily.     QUEtiapine (SEROQUEL) 50 MG tablet Take 1 tablet (50 mg total) by mouth 2 (two) times daily. 60 tablet 1   sertraline (ZOLOFT) 50 MG tablet TAKE ONE TABLET BY MOUTH ONE TIME DAILY 30 tablet 1   No current facility-administered medications on file prior to visit.    Allergies:   Allergies  Allergen Reactions   Amoxicillin Nausea Only   Shellfish Allergy Diarrhea and Nausea And Vomiting    Mussels     Physical Exam General: Frail middle-aged lady, seated, in no evident distress Head: head normocephalic and atraumatic.  Neck: supple with no carotid or supraclavicular bruits Cardiovascular: regular rate and rhythm, no murmurs Musculoskeletal: no deformity mild kyphoscoliosis.  Right foot drop.  Right AFO Skin:  no rash/petichiae Vascular:  Normal pulses all extremities There were no vitals filed for this visit. Neurologic Exam Mental Status: Awake and fully alert.  Severe expressive aphasia unable to speak only occasional words and guttural sounds.  Good comprehension.  Unable to name or repeat.  Follows most commands mood and affect appropriate.   Cranial Nerves: Fundoscopic exam reveals sharp disc margins. Pupils equal, briskly reactive to light. Extraocular movements full without nystagmus. Visual fields show partial right homonymous hemianopsia to confrontation. Hearing intact. Facial sensation intact.  Mild right lower facial weakness., tongue, palate moves normally and symmetrically.  Motor: Spastic right hemiplegia with right upper extremity 0/5 in right lower extremity 2/5 strength proximally and 0/5 at the knee and ankles.  Tone is increased on the right compared to the left.  Normal strength on the left side.   Sensory.:  Slightly diminished  touch ,pinprick .position and vibratory sensation on the right side compared to the left.  Coordination: Normal on the left and cannot be tested on the right. Gait and Station: Arises from chair with slight difficulty. Stance is normal.  Hemiplegic gait with circumduction of the right foot with right foot drop and Mehringer AFO. Reflexes: 2+ and asymmetric and brisker on the right. Toes downgoing.   NIHSS  12 Modified Rankin  3   ASSESSMENT: 55 year old lady with hypertensive left basal ganglia hemorrhage in December 2020 with significant residual aphasia and spastic right hemiplegia.     PLAN: I had a long d/w patient, her husband and sister about her remote hemorrhagic stroke, risk for recurrent stroke/TIAs, personally independently reviewed imaging studies and stroke evaluation results and answered questions.Continue  strict control of hypertension with blood pressure goal below 130/90, diabetes with hemoglobin A1c goal below 6.5% and lipids with LDL cholesterol goal below 70 mg/dL. I also advised the patient to eat a healthy diet with plenty of whole grains, cereals, fruits and vegetables, exercise regularly and maintain ideal body weight .she was advised to use her cane at all times and we discussed fall safety precautions.  Followup in the future with me in 6 months or call earlier if  necessary.  Patient's husband was also advised to check with [facility] speech therapy program to see if they have any programs open for which the patient may qualify. Greater than 50% of time during this 40 minute prolonged visit was spent on counseling,explanation of diagnosis, planning of further management, discussion with patient and family and coordination of care Antony Contras, MD Note: This document was prepared with digital dictation and possible smart phrase technology. Any transcriptional errors that result from this process are unintentional

## 2021-07-10 NOTE — Patient Instructions (Signed)
I had a long d/w patient, her husband and sister about her remote hemorrhagic stroke, risk for recurrent stroke/TIAs, personally independently reviewed imaging studies and stroke evaluation results and answered questions.Continue  strict control of hypertension with blood pressure goal below 130/90, diabetes with hemoglobin A1c goal below 6.5% and lipids with LDL cholesterol goal below 70 mg/dL. I also advised the patient to eat a healthy diet with plenty of whole grains, cereals, fruits and vegetables, exercise regularly and maintain ideal body weight .she was advised to use her cane at all times and we discussed fall safety precautions.  Followup in the future with me in 6 months or call earlier if necessary. Fall Prevention in the Home, Adult Falls can cause injuries and can happen to people of all ages. There are many things you can do to make your home safe and to help prevent falls. Ask for help when making these changes. What actions can I take to prevent falls? General Instructions Use good lighting in all rooms. Replace any light bulbs that burn out. Turn on the lights in dark areas. Use night-lights. Keep items that you use often in easy-to-reach places. Lower the shelves around your home if needed. Set up your furniture so you have a clear path. Avoid moving your furniture around. Do not have throw rugs or other things on the floor that can make you trip. Avoid walking on wet floors. If any of your floors are uneven, fix them. Add color or contrast paint or tape to clearly mark and help you see: Grab bars or handrails. First and last steps of staircases. Where the edge of each step is. If you use a stepladder: Make sure that it is fully opened. Do not climb a closed stepladder. Make sure the sides of the stepladder are locked in place. Ask someone to hold the stepladder while you use it. Know where your pets are when moving through your home. What can I do in the bathroom?   Keep  the floor dry. Clean up any water on the floor right away. Remove soap buildup in the tub or shower. Use nonskid mats or decals on the floor of the tub or shower. Attach bath mats securely with double-sided, nonslip rug tape. If you need to sit down in the shower, use a plastic, nonslip stool. Install grab bars by the toilet and in the tub and shower. Do not use towel bars as grab bars. What can I do in the bedroom? Make sure that you have a light by your bed that is easy to reach. Do not use any sheets or blankets for your bed that hang to the floor. Have a firm chair with side arms that you can use for support when you get dressed. What can I do in the kitchen? Clean up any spills right away. If you need to reach something above you, use a step stool with a grab bar. Keep electrical cords out of the way. Do not use floor polish or wax that makes floors slippery. What can I do with my stairs? Do not leave any items on the stairs. Make sure that you have a light switch at the top and the bottom of the stairs. Make sure that there are handrails on both sides of the stairs. Fix handrails that are broken or loose. Install nonslip stair treads on all your stairs. Avoid having throw rugs at the top or bottom of the stairs. Choose a carpet that does not hide the edge of  the steps on the stairs. Check carpeting to make sure that it is firmly attached to the stairs. Fix carpet that is loose or worn. What can I do on the outside of my home? Use bright outdoor lighting. Fix the edges of walkways and driveways and fix any cracks. Remove anything that might make you trip as you walk through a door, such as a raised step or threshold. Trim any bushes or trees on paths to your home. Check to see if handrails are loose or broken and that both sides of all steps have handrails. Install guardrails along the edges of any raised decks and porches. Clear paths of anything that can make you trip, such as  tools or rocks. Have leaves, snow, or ice cleared regularly. Use sand or salt on paths during winter. Clean up any spills in your garage right away. This includes grease or oil spills. What other actions can I take? Wear shoes that: Have a low heel. Do not wear high heels. Have rubber bottoms. Feel good on your feet and fit well. Are closed at the toe. Do not wear open-toe sandals. Use tools that help you move around if needed. These include: Canes. Walkers. Scooters. Crutches. Review your medicines with your doctor. Some medicines can make you feel dizzy. This can increase your chance of falling. Ask your doctor what else you can do to help prevent falls. Where to find more information Centers for Disease Control and Prevention, [employer]: http://www.wolf.info/ National Institute on Aging: http://kim-miller.com/ Contact a doctor if: You are afraid of falling at home. You feel weak, drowsy, or dizzy at home. You fall at home. Summary There are many simple things that you can do to make your home safe and to help prevent falls. Ways to make your home safe include removing things that can make you trip and installing grab bars in the bathroom. Ask for help when making these changes in your home. This information is not intended to replace advice given to you by your health care provider. Make sure you discuss any questions you have with your health care provider. Document Revised: 02/17/2020 Document Reviewed: 02/17/2020 Elsevier Patient Education  [place].

## 2021-09-12 ENCOUNTER — Ambulatory Visit: Payer: Managed Care, Other (non HMO) | Admitting: Psychology

## 2021-10-09 ENCOUNTER — Other Ambulatory Visit: Payer: Self-pay | Admitting: Urology

## 2021-10-09 DIAGNOSIS — D3500 Benign neoplasm of unspecified adrenal gland: Secondary | ICD-10-CM

## 2021-10-09 DIAGNOSIS — N83209 Unspecified ovarian cyst, unspecified side: Secondary | ICD-10-CM

## 2021-10-25 ENCOUNTER — Other Ambulatory Visit: Payer: Managed Care, Other (non HMO)

## 2021-10-26 ENCOUNTER — Ambulatory Visit: Payer: Managed Care, Other (non HMO) | Admitting: Physical Medicine & Rehabilitation

## 2021-10-26 ENCOUNTER — Other Ambulatory Visit: Payer: Self-pay | Admitting: Urology

## 2021-10-26 ENCOUNTER — Ambulatory Visit
Admission: RE | Admit: 2021-10-26 | Discharge: 2021-10-26 | Disposition: A | Discharge status: Home / Self Care | Payer: Managed Care, Other (non HMO) | Source: Ambulatory Visit | Attending: Urology | Admitting: Urology

## 2021-10-26 DIAGNOSIS — D3500 Benign neoplasm of unspecified adrenal gland: Secondary | ICD-10-CM

## 2021-10-26 DIAGNOSIS — N83209 Unspecified ovarian cyst, unspecified side: Secondary | ICD-10-CM

## 2021-10-26 MED ORDER — GADOBENATE DIMEGLUMINE 529 MG/ML IV SOLN
12.0000 mL | Freq: Once | INTRAVENOUS | Status: AC | PRN
  Administered 2021-10-26: 12 mL via INTRAVENOUS

## 2021-11-03 ENCOUNTER — Telehealth: Payer: Self-pay | Admitting: *Deleted

## 2021-11-03 ENCOUNTER — Encounter: Payer: Self-pay | Admitting: Gynecologic Oncology

## 2021-11-03 NOTE — Telephone Encounter (Signed)
Spoke with pt's husband [name] who speak english to do meaningful use. He stated that pt has right sided deficit from a stroke in both her arms and leg and extreme asphasia. He asked [place] to not mention any medications in front of pt because she gets very anxious about it. We will inform the providers.  ?

## 2021-11-06 ENCOUNTER — Inpatient Hospital Stay (HOSPITAL_BASED_OUTPATIENT_CLINIC_OR_DEPARTMENT_OTHER): Payer: Managed Care, Other (non HMO) | Admitting: Gynecologic Oncology

## 2021-11-06 ENCOUNTER — Encounter: Payer: Self-pay | Admitting: Gynecologic Oncology

## 2021-11-06 ENCOUNTER — Other Ambulatory Visit: Payer: Self-pay

## 2021-11-06 ENCOUNTER — Inpatient Hospital Stay: Payer: Managed Care, Other (non HMO) | Attending: Gynecologic Oncology | Admitting: Gynecologic Oncology

## 2021-11-06 ENCOUNTER — Inpatient Hospital Stay: Payer: Managed Care, Other (non HMO)

## 2021-11-06 VITALS — BP 132/84 | HR 87 | Temp 98.7°F | Resp 18 | Ht 64.96 in | Wt 145.0 lb

## 2021-11-06 DIAGNOSIS — Z87442 Personal history of urinary calculi: Secondary | ICD-10-CM | POA: Insufficient documentation

## 2021-11-06 DIAGNOSIS — K589 Irritable bowel syndrome without diarrhea: Secondary | ICD-10-CM | POA: Insufficient documentation

## 2021-11-06 DIAGNOSIS — E781 Pure hyperglyceridemia: Secondary | ICD-10-CM | POA: Insufficient documentation

## 2021-11-06 DIAGNOSIS — R1900 Intra-abdominal and pelvic swelling, mass and lump, unspecified site: Secondary | ICD-10-CM | POA: Insufficient documentation

## 2021-11-06 DIAGNOSIS — R4701 Aphasia: Secondary | ICD-10-CM | POA: Diagnosis not present

## 2021-11-06 DIAGNOSIS — R5381 Other malaise: Secondary | ICD-10-CM

## 2021-11-06 DIAGNOSIS — F419 Anxiety disorder, unspecified: Secondary | ICD-10-CM | POA: Insufficient documentation

## 2021-11-06 DIAGNOSIS — I69359 Hemiplegia and hemiparesis following cerebral infarction affecting unspecified side: Secondary | ICD-10-CM | POA: Diagnosis not present

## 2021-11-06 DIAGNOSIS — Z780 Asymptomatic menopausal state: Secondary | ICD-10-CM | POA: Diagnosis present

## 2021-11-06 DIAGNOSIS — Z79899 Other long term (current) drug therapy: Secondary | ICD-10-CM | POA: Insufficient documentation

## 2021-11-06 DIAGNOSIS — I610 Nontraumatic intracerebral hemorrhage in hemisphere, subcortical: Secondary | ICD-10-CM

## 2021-11-06 DIAGNOSIS — N9489 Other specified conditions associated with female genital organs and menstrual cycle: Secondary | ICD-10-CM

## 2021-11-06 LAB — CEA (IN HOUSE-CHCC): CEA (CHCC-In House): 2.9 ng/mL (ref 0.00–5.00)

## 2021-11-06 NOTE — Progress Notes (Signed)
GYNECOLOGIC ONCOLOGY NEW PATIENT CONSULTATION  ? ?Patient Name: Melanie Madden  ?Patient Age: 56 y.o. ?Date of Service: 11/06/21 ?Referring Provider: Dr. Ellison Hughs ? ?Primary Care Provider: Eilene Ghazi, NP ?Consulting Provider: Jeral Pinch, MD  ? ?Assessment/Plan:  ?Postmenopausal patient with complex bilateral adnexal masses. ? ?I discussed with the patient and her husband today findings on recent MRI.  We reviewed the images together.  These adnexal masses are new from prior imaging.  Both appear complex.  This raises the concern given their complexity as well as their short interval development for malignancy.  We discussed that imaging is not diagnostic and that the only way to make a definitive diagnosis is to proceed with surgical evaluation and excision for pathologic examination. ? ?We will plan to get tumor markers today including CEA and Ca1 25.  I stressed that these are also not diagnostic.  With the latter of these tests, the value can be normal in up to 50% of women with early stage ovarian cancer.  It also may be elevated in many noncancerous disease processes. ? ?Given her significant neurologic history with recent hemorrhagic stroke, we discussed need for surgical clearance from both her primary care provider as well as her neurologist.  I would typically give someone prophylactic dose of heparin prior to surgery to decrease the risk of VTE.  If ovarian cancer is identified during surgery, then the patient would typically get either a 2-week course of prophylactic Lovenox if surgery can be done in a minimally invasive manner or 4-week course if done through an open incision.  I do not know if patient is able to get blood thinners given her hemorrhagic stroke history. ? ?If she is cleared for surgery, plan will be for minimally invasive surgery using robotic assistance.  At minimum, would recommend that we plan on bilateral salpingo-oophorectomy with frozen section.   Both ovaries will be placed in a bag for contained drainage and removal.  If no malignancy is found on frozen section, then no additional surgery would be indicated.  In the setting of a borderline tumor, then hysterectomy could be considered as well as peritoneal and omental biopsies.  If malignancy encountered, then we would proceed with concurrent total hysterectomy and tumor staging including lymph node assessment, peritoneal biopsies and omentectomy. ? ?Discussed the plan for a robotic assisted bilateral salpingo-oophorectomy, possible total hysterectomy, possible staging, possible laparotomy. The risks of surgery were discussed in detail and she understands these to include infection; wound separation; hernia; vaginal cuff separation, injury to adjacent organs such as bowel, bladder, blood vessels, ureters and nerves; bleeding which may require blood transfusion; anesthesia risk; thromboembolic events; possible death; unforeseen complications; possible need for re-exploration; medical complications such as heart attack, stroke, pleural effusion and pneumonia; and, if full lymphadenectomy is performed the risk of lymphedema and lymphocyst. The patient will receive DVT and antibiotic prophylaxis as indicated this approved by her neurologist.  Information was reviewed with the patient as well as her husband.  They had the opportunity to ask questions. Perioperative instructions were reviewed with both.  ? ?A copy of this note was sent to the patient's referring provider.  ? ?65 minutes of total time was spent for this patient encounter, including preparation, face-to-face counseling with the patient and coordination of care, and documentation of the encounter. ? ? ?Jeral Pinch, MD  ?Division of Gynecologic Oncology  ?Department of Obstetrics and Gynecology  ?[facility]  ?___________________________________________  ?Chief Complaint: ?  Chief Complaint  ?Patient presents with  ?  Pelvic mass  ? ? ?History of Present Illness:  ?Melanie Madden is a 56 y.o. y.o. female who is seen in consultation at the request of Dr. Lovena Neighbours for an evaluation of bilateral complex adnexal masses. ? ?Patient has a history of kidney stones.  She underwent right-sided PCN followed by interval ureteroscopy for right-sided stones in July 2022.  She was recently seen for follow-up in late February by her urologist.  Renal ultrasound and KUB showed small bilateral nonobstructing renal stones.  CT scan was ordered to better assess her stone burden. CT urogram on 10/05/2021 shows bilateral nephrolithiasis, uterine fibroids.  There is a 4.9 cm right adnexal mass, thought to represent either an ovarian mass or pedunculated fibroid.  4 cm left adrenal cystic lesion is incompletely characterized on the study.  ? ?MRI abdomen and pelvis, which was performed on 10/26/21 for evaluation of left adrenal gland and pelvic mass, showed unremarkable uterus with several small myometrial fibroids.  New large bilateral adnexal masses, both diffusion positive and demonstrate heterogenous contrast-enhancement.  Left mass measures up to 8 cm in the right up to 5 cm. No adenopathy, omental disease, or other evidence of metastatic disease.  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 ? 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  She is able to communicate some, but much of today's history is obtained from her husband.  She lives at home with her husband, has caretaker during the day.  She is able to walk on her own but uses a cane/walking device. ? ?She has some right pelvic bloating and discomfort.  Denies any significant pain.  Denies any vaginal bleeding or discharge.  Denies any urinary symptoms.  Has IBS and notes some constipation at baseline.  Denies any recent changes.  Takes Colace daily.   Uses Everclear if she has increased constipation.  She endorses a normal appetite, denies any nausea or emesis.  Has had small amount of weight gain over the last 10 months. ? ?PAST MEDICAL HISTORY:  ?Past Medical History:  ?Diagnosis Date  ? Allergy   ? Anxiety   ? Arthritis   ? Expressive aphasia   ? Hemiparesis ([place])   ? Right side  ? High triglycerides   ? History of kidney stones   ? Kidney stones   ? Recurrent sinus infections   ? Stroke [facility])   ?  ? ?PAST SURGICAL HISTORY:  ?Past Surgical History:  ?Procedure Laterality Date  ? ANTERIOR CRUCIATE LIGAMENT REPAIR Left   ? BUNIONECTOMY Right   ? CYSTOSCOPY/URETEROSCOPY/HOLMIUM LASER/STENT PLACEMENT Right 02/08/2021  ? Procedure: CYSTOSCOPY/RETROGRADE/URETEROSCOPY/HOLMIUM LASER/STENT PLACEMENT, NEPHROSTOMY TUBE REMOVAL;  Surgeon: Ceasar Mons, MD;  Location: [facility];  Service: Urology;  Laterality: Right;  ? IR NEPHROSTOMY PLACEMENT RIGHT  12/09/2020  ? LASIK    ? LIPOSUCTION    ? LITHOTRIPSY    ? MENISCUS REPAIR Left   ? NASAL SEPTUM SURGERY    ? SHOULDER SURGERY    ? VARICOSE VEIN SURGERY Left   ? ? ?OB/GYN HISTORY:  ?OB History  ?Gravida Para Term Preterm AB Living  ?0 0 0 0 0 0  ?SAB IAB Ectopic Multiple Live Births  ?0 0 0 0 0  ? ? ?No LMP recorded. Patient has had an injection. ? ?Age at menarche: 59 ?Age at menopause: 23 ?Hx of  HRT: Denies ?Hx of STDs: Denies ?Last pap: 2018, per patient's husband ?History of abnormal pap smears: Denies ? ?SCREENING STUDIES:  ?Last mammogram: 11/2016  ?Last colonoscopy: 2019 ? ?MEDICATIONS: ?Outpatient Encounter Medications as of 11/06/2021  ?Medication Sig  ? amLODipine (NORVASC) 10 MG tablet Take 10 mg by mouth daily.  ? atorvastatin (LIPITOR) 20 MG tablet Take 1 tablet (20 mg total) by mouth daily at 6 PM.  ? B Complex-C (B-COMPLEX WITH VITAMIN C) tablet Take 1 tablet by mouth daily.  ? CALCIUM PO Take 1 tablet by mouth daily.  ? cholecalciferol (VITAMIN D) 1000 units tablet Take 1,000 Units by mouth daily.  2,000 units daily  ? docusate sodium (COLACE) 100 MG capsule Take 2 capsules (200 mg total) by mouth daily.  ? magnesium 30 MG tablet Take 30 mg by mouth daily.  ? Omega-3 Fatty Acids (FISH OIL) 1200 MG CP

## 2021-11-06 NOTE — Patient Instructions (Signed)
We will need to obtain clearance from your medical doctor and neurologist along with recommendations about whether you can go home the same day of surgery or if you need to be kept in the hospital overnight for monitoring. We are also going to ask about whether you can receive a preop dose of heparin before surgery to help prevent blood clots.  ?  ?If a cancer is identified, we usually recommend a blood thinner after surgery to prevent blood clots. We will be reaching out to your doctors to see if this is needed because of a new cancer diagnosis, is it safe for you to take.  ?  ?Preparing for your Surgery ?  ?Plan for surgery on Dec 06, 2021 with Dr. Jeral Pinch at [facility] will be scheduled for robotic assisted laparoscopic bilateral salpingo-oophorectomy (removal of ovaries and fallopian tubes), possible robotic assisted total laparoscopic hysterectomy (removal of the uterus and cervix), possible staging if a cancer is identified, possible laparotomy (larger incision on your abdomen if needed), possible pap smear of the cervix if a hysterectomy is not being performed at the time of surgery.  ?  ?Pre-operative Testing ?-You will receive a phone call from presurgical testing at [facility] to arrange for a pre-operative appointment and lab work. ?  ?-Bring your insurance card, copy of an advanced directive if applicable, medication list ?  ?-At that visit, you will be asked to sign a consent for a possible blood transfusion in case a transfusion becomes necessary during surgery.  The need for a blood transfusion is rare but having consent is a necessary part of your care.    ?  ?-You should not be taking blood thinners or aspirin at least ten days prior to surgery unless instructed by your surgeon. ?  ?-Do not take supplements such as fish oil (omega 3), red yeast rice, turmeric before your surgery. You want to avoid medications with aspirin in them including headache powders such as  BC or Goody's), Excedrin migraine. ?  ?Day Before Surgery at Home ?-You will be asked to take in a light diet the day before surgery. You will be advised you can have clear liquids up until 3 hours before your surgery.   ?  ?Eat a light diet the day before surgery.  Examples including soups, broths, toast, yogurt, mashed potatoes.  AVOID GAS PRODUCING FOODS. Things to avoid include carbonated beverages (fizzy beverages, sodas), raw fruits and raw vegetables (uncooked), or beans.  ?  ?If your bowels are filled with gas, your surgeon will have difficulty visualizing your pelvic organs which increases your surgical risks. ?  ?Your role in recovery ?Your role is to become active as soon as directed by your doctor, while still giving yourself time to heal.  Rest when you feel tired. You will be asked to do the following in order to speed your recovery: ?  ?- Cough and breathe deeply. This helps to clear and expand your lungs and can prevent pneumonia after surgery.  ?- STAY ACTIVE WHEN YOU GET HOME. Do mild physical activity. Walking or moving your legs help your circulation and body functions return to normal. Do not try to get up or walk alone the first time after surgery.   ?-If you develop swelling on one leg or the other, pain in the back of your leg, redness/warmth in one of your legs, please call the office or go to the Emergency Room to have a doppler to rule out  a blood clot. For shortness of breath, chest pain-seek care in the Emergency Room as soon as possible. ?- Actively manage your pain. Managing your pain lets you move in comfort. We will ask you to rate your pain on a scale of zero to 10. It is your responsibility to tell your doctor or nurse where and how much you hurt so your pain can be treated. ?  ?Special Considerations ?-If you are diabetic, you may be placed on insulin after surgery to have closer control over your blood sugars to promote healing and recovery.  This does not mean that you will be  discharged on insulin.  If applicable, your oral antidiabetics will be resumed when you are tolerating a solid diet. ?  ?-Your final pathology results from surgery should be available around one week after surgery and the results will be relayed to you when available. ?  ?-FMLA forms can be faxed to [phone] and please allow 5-7 business days for completion. ?  ?Pain Management After Surgery ?-Make sure that you have Tylenol and Ibuprofen at home if you are able to take these medications to use on a regular basis after surgery for pain control. We recommend alternating the medications every hour to six hours since they work differently and are processed in the body differently for pain relief. ?  ?-Review the attached handout on narcotic use and their risks and side effects.  ?  ?Bowel Regimen ?-You will be prescribed Sennakot-S to take nightly to prevent constipation especially if you are taking the narcotic pain medication intermittently.  It is important to prevent constipation and drink adequate amounts of liquids. You can stop taking this medication when you are not taking pain medication and you are back on your normal bowel routine. ?  ?Risks of Surgery ?Risks of surgery are low but include bleeding, infection, damage to surrounding structures, re-operation, blood clots, and very rarely death. ?  ?  ?Blood Transfusion Information (For the consent to be signed before surgery) ?  ?We will be checking your blood type before surgery so in case of emergencies, we will know what type of blood you would need. ?  ?                                          WHAT IS A BLOOD TRANSFUSION? ?  ?A transfusion is the replacement of blood or some of its parts. Blood is made up of multiple cells which provide different functions. ?Red blood cells carry oxygen and are used for blood loss replacement. ?White blood cells fight against infection. ?Platelets control bleeding. ?Plasma helps clot blood. ?Other blood products are  available for specialized needs, such as hemophilia or other clotting disorders. ?BEFORE THE TRANSFUSION  ?Who gives blood for transfusions?  ?You may be able to donate blood to be used at a later date on yourself (autologous donation). ?Relatives can be asked to donate blood. This is generally not any safer than if you have received blood from a stranger. The same precautions are taken to ensure safety when a relative's blood is donated. ?Healthy volunteers who are fully evaluated to make sure their blood is safe. This is blood bank blood. ?Transfusion therapy is the safest it has ever been in the practice of medicine. Before blood is taken from a donor, a complete history is taken to make sure that person has no  history of diseases nor engages in risky social behavior (examples are intravenous drug use or sexual activity with multiple partners). The donor's travel history is screened to minimize risk of transmitting infections, such as malaria. The donated blood is tested for signs of infectious diseases, such as HIV and hepatitis. The blood is then tested to be sure it is compatible with you in order to minimize the chance of a transfusion reaction. If you or a relative donates blood, this is often done in anticipation of surgery and is not appropriate for emergency situations. It takes many days to process the donated blood. ?RISKS AND COMPLICATIONS ?Although transfusion therapy is very safe and saves many lives, the main dangers of transfusion include:  ?Getting an infectious disease. ?Developing a transfusion reaction. This is an allergic reaction to something in the blood you were given. Every precaution is taken to prevent this. ?The decision to have a blood transfusion has been considered carefully by your caregiver before blood is given. Blood is not given unless the benefits outweigh the risks. ?  ?AFTER SURGERY INSTRUCTIONS ?  ?Return to work: 4-6 weeks if applicable ?  ?Activity: ?1. Be up and out of  the bed during the day.  Take a nap if needed.  You may walk up steps but be careful and use the hand rail.  Stair climbing will tire you more than you think, you may need to stop part way and rest.  ?  ?2

## 2021-11-06 NOTE — H&P (View-Only) (Signed)
GYNECOLOGIC ONCOLOGY NEW PATIENT CONSULTATION  ? ?Patient Name: Melanie Madden  ?Patient Age: 56 y.o. ?Date of Service: 11/06/21 ?Referring Provider: Dr. Ellison Hughs ? ?Primary Care Provider: Eilene Ghazi, NP ?Consulting Provider: Jeral Pinch, MD  ? ?Assessment/Plan:  ?Postmenopausal patient with complex bilateral adnexal masses. ? ?I discussed with the patient and her husband today findings on recent MRI.  We reviewed the images together.  These adnexal masses are new from prior imaging.  Both appear complex.  This raises the concern given their complexity as well as their short interval development for malignancy.  We discussed that imaging is not diagnostic and that the only way to make a definitive diagnosis is to proceed with surgical evaluation and excision for pathologic examination. ? ?We will plan to get tumor markers today including CEA and Ca1 25.  I stressed that these are also not diagnostic.  With the latter of these tests, the value can be normal in up to 50% of women with early stage ovarian cancer.  It also may be elevated in many noncancerous disease processes. ? ?Given her significant neurologic history with recent hemorrhagic stroke, we discussed need for surgical clearance from both her primary care provider as well as her neurologist.  I would typically give someone prophylactic dose of heparin prior to surgery to decrease the risk of VTE.  If ovarian cancer is identified during surgery, then the patient would typically get either a 2-week course of prophylactic Lovenox if surgery can be done in a minimally invasive manner or 4-week course if done through an open incision.  I do not know if patient is able to get blood thinners given her hemorrhagic stroke history. ? ?If she is cleared for surgery, plan will be for minimally invasive surgery using robotic assistance.  At minimum, would recommend that we plan on bilateral salpingo-oophorectomy with frozen section.   Both ovaries will be placed in a bag for contained drainage and removal.  If no malignancy is found on frozen section, then no additional surgery would be indicated.  In the setting of a borderline tumor, then hysterectomy could be considered as well as peritoneal and omental biopsies.  If malignancy encountered, then we would proceed with concurrent total hysterectomy and tumor staging including lymph node assessment, peritoneal biopsies and omentectomy. ? ?Discussed the plan for a robotic assisted bilateral salpingo-oophorectomy, possible total hysterectomy, possible staging, possible laparotomy. The risks of surgery were discussed in detail and she understands these to include infection; wound separation; hernia; vaginal cuff separation, injury to adjacent organs such as bowel, bladder, blood vessels, ureters and nerves; bleeding which may require blood transfusion; anesthesia risk; thromboembolic events; possible death; unforeseen complications; possible need for re-exploration; medical complications such as heart attack, stroke, pleural effusion and pneumonia; and, if full lymphadenectomy is performed the risk of lymphedema and lymphocyst. The patient will receive DVT and antibiotic prophylaxis as indicated this approved by her neurologist.  Information was reviewed with the patient as well as her husband.  They had the opportunity to ask questions. Perioperative instructions were reviewed with both.  ? ?A copy of this note was sent to the patient's referring provider.  ? ?65 minutes of total time was spent for this patient encounter, including preparation, face-to-face counseling with the patient and coordination of care, and documentation of the encounter. ? ? ?Jeral Pinch, MD  ?Division of Gynecologic Oncology  ?Department of Obstetrics and Gynecology  ?[facility]  ?___________________________________________  ?Chief Complaint: ?  Chief Complaint  ?Patient presents with  ?  Pelvic mass  ? ? ?History of Present Illness:  ?Melanie Madden is a 56 y.o. y.o. female who is seen in consultation at the request of Dr. Lovena Neighbours for an evaluation of bilateral complex adnexal masses. ? ?Patient has a history of kidney stones.  She underwent right-sided PCN followed by interval ureteroscopy for right-sided stones in July 2022.  She was recently seen for follow-up in late February by her urologist.  Renal ultrasound and KUB showed small bilateral nonobstructing renal stones.  CT scan was ordered to better assess her stone burden. CT urogram on 10/05/2021 shows bilateral nephrolithiasis, uterine fibroids.  There is a 4.9 cm right adnexal mass, thought to represent either an ovarian mass or pedunculated fibroid.  4 cm left adrenal cystic lesion is incompletely characterized on the study.  ? ?MRI abdomen and pelvis, which was performed on 10/26/21 for evaluation of left adrenal gland and pelvic mass, showed unremarkable uterus with several small myometrial fibroids.  New large bilateral adnexal masses, both diffusion positive and demonstrate heterogenous contrast-enhancement.  Left mass measures up to 8 cm in the right up to 5 cm. No adenopathy, omental disease, or other evidence of metastatic disease.  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 ? 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  She is able to communicate some, but much of today's history is obtained from her husband.  She lives at home with her husband, has caretaker during the day.  She is able to walk on her own but uses a cane/walking device. ? ?She has some right pelvic bloating and discomfort.  Denies any significant pain.  Denies any vaginal bleeding or discharge.  Denies any urinary symptoms.  Has IBS and notes some constipation at baseline.  Denies any recent changes.  Takes Colace daily.   Uses Everclear if she has increased constipation.  She endorses a normal appetite, denies any nausea or emesis.  Has had small amount of weight gain over the last 10 months. ? ?PAST MEDICAL HISTORY:  ?Past Medical History:  ?Diagnosis Date  ? Allergy   ? Anxiety   ? Arthritis   ? Expressive aphasia   ? Hemiparesis ([place])   ? Right side  ? High triglycerides   ? History of kidney stones   ? Kidney stones   ? Recurrent sinus infections   ? Stroke [facility])   ?  ? ?PAST SURGICAL HISTORY:  ?Past Surgical History:  ?Procedure Laterality Date  ? ANTERIOR CRUCIATE LIGAMENT REPAIR Left   ? BUNIONECTOMY Right   ? CYSTOSCOPY/URETEROSCOPY/HOLMIUM LASER/STENT PLACEMENT Right 02/08/2021  ? Procedure: CYSTOSCOPY/RETROGRADE/URETEROSCOPY/HOLMIUM LASER/STENT PLACEMENT, NEPHROSTOMY TUBE REMOVAL;  Surgeon: Ceasar Mons, MD;  Location: [facility];  Service: Urology;  Laterality: Right;  ? IR NEPHROSTOMY PLACEMENT RIGHT  12/09/2020  ? LASIK    ? LIPOSUCTION    ? LITHOTRIPSY    ? MENISCUS REPAIR Left   ? NASAL SEPTUM SURGERY    ? SHOULDER SURGERY    ? VARICOSE VEIN SURGERY Left   ? ? ?OB/GYN HISTORY:  ?OB History  ?Gravida Para Term Preterm AB Living  ?0 0 0 0 0 0  ?SAB IAB Ectopic Multiple Live Births  ?0 0 0 0 0  ? ? ?No LMP recorded. Patient has had an injection. ? ?Age at menarche: 9 ?Age at menopause: 57 ?Hx of  HRT: Denies ?Hx of STDs: Denies ?Last pap: 2018, per patient's husband ?History of abnormal pap smears: Denies ? ?SCREENING STUDIES:  ?Last mammogram: 11/2016  ?Last colonoscopy: 2019 ? ?MEDICATIONS: ?Outpatient Encounter Medications as of 11/06/2021  ?Medication Sig  ? amLODipine (NORVASC) 10 MG tablet Take 10 mg by mouth daily.  ? atorvastatin (LIPITOR) 20 MG tablet Take 1 tablet (20 mg total) by mouth daily at 6 PM.  ? B Complex-C (B-COMPLEX WITH VITAMIN C) tablet Take 1 tablet by mouth daily.  ? CALCIUM PO Take 1 tablet by mouth daily.  ? cholecalciferol (VITAMIN D) 1000 units tablet Take 1,000 Units by mouth daily.  2,000 units daily  ? docusate sodium (COLACE) 100 MG capsule Take 2 capsules (200 mg total) by mouth daily.  ? magnesium 30 MG tablet Take 30 mg by mouth daily.  ? Omega-3 Fatty Acids (FISH OIL) 1200 MG CP

## 2021-11-06 NOTE — Progress Notes (Signed)
Patient here with her husband for new patient consultation with Dr. Jeral Pinch and for a pre-operative discussion prior to her scheduled surgery on Dec 06, 2021. She is scheduled for  robotic assisted laparoscopic bilateral salpingo-oophorectomy, possible robotic assisted total laparoscopic hysterectomy, possible staging if a cancer is identified, possible laparotomy, possible pap smear of the cervix if a hysterectomy is not being performed at the time of surgery. The surgery was discussed in detail.  See after visit summary for additional details. Visual aids used to discuss items related to surgery including female reproductive system to discuss surgery in detail.    ?  ?Discussed post-op pain management in detail including the aspects of the enhanced recovery pathway.  Advised her that a new prescription would be sent in for tramadol closer to the surgery date and it is only to be used for after her upcoming surgery.  We discussed the use of tylenol post-op and to monitor for a maximum of 4,000 mg in a 24 hour period.  Also plan to prescribe sennakot to be used after surgery and to hold if having loose stools.  Discussed bowel regimen in detail.   ?  ?Discussed the use of SCDs and measures to take at home to prevent DVT including frequent mobility.  Reportable signs and symptoms of DVT discussed. Post-operative instructions discussed and expectations for after surgery. Incisional care discussed as well including reportable signs and symptoms including erythema, drainage, wound separation.  ?   ?15 minutes spent with the patient and husband.  Verbalizing understanding of material discussed. No needs or concerns voiced at the end of the visit.   Advised patient and family to call for any needs.   ? ?We will need to obtain clearance from the patient's medical doctor and neurologist along with recommendations about whether she can be discharged the same day of surgery or if it is felt she would need to stay in  the hospital overnight for monitoring. We are also going to inquire about whether the patient can receive a preop prophylactic dose of heparin before surgery.  ?  ?If a cancer is identified, we usually recommend a blood thinner after surgery to prevent blood clots. We will also reach out to her providers to see if this is needed because of a new cancer diagnosis, is it safe for her to take.  ? ?This appointment is included in the global surgical bundle as pre-operative teaching and has no charge.     ?

## 2021-11-06 NOTE — Patient Instructions (Addendum)
We will need to obtain clearance from your medical doctor and neurologist along with recommendations about whether you can go home the same day of surgery or if you need to be kept in the hospital overnight for monitoring. We are also going to ask about whether you can receive a preop dose of heparin before surgery to help prevent blood clots.  ? ?If a cancer is identified, we usually recommend a blood thinner after surgery to prevent blood clots. We will be reaching out to your doctors to see if this is needed because of a new cancer diagnosis, is it safe for you to take.  ? ?Preparing for your Surgery ? ?Plan for surgery on Dec 06, 2021 with Dr. Jeral Pinch at [place] will be scheduled for robotic assisted laparoscopic bilateral salpingo-oophorectomy (removal of ovaries and fallopian tubes), possible robotic assisted total laparoscopic hysterectomy (removal of the uterus and cervix), possible staging if a cancer is identified, possible laparotomy (larger incision on your abdomen if needed), possible pap smear of the cervix if a hysterectomy is not being performed at the time of surgery.  ? ?Pre-operative Testing ?-You will receive a phone call from presurgical testing at [facility] to arrange for a pre-operative appointment and lab work. ? ?-Bring your insurance card, copy of an advanced directive if applicable, medication list ? ?-At that visit, you will be asked to sign a consent for a possible blood transfusion in case a transfusion becomes necessary during surgery.  The need for a blood transfusion is rare but having consent is a necessary part of your care.    ? ?-You should not be taking blood thinners or aspirin at least ten days prior to surgery unless instructed by your surgeon. ? ?-Do not take supplements such as fish oil (omega 3), red yeast rice, turmeric before your surgery. You want to avoid medications with aspirin in them including headache powders such as BC or  Goody's), Excedrin migraine. ? ?Day Before Surgery at Home ?-You will be asked to take in a light diet the day before surgery. You will be advised you can have clear liquids up until 3 hours before your surgery.   ? ?Eat a light diet the day before surgery.  Examples including soups, broths, toast, yogurt, mashed potatoes.  AVOID GAS PRODUCING FOODS. Things to avoid include carbonated beverages (fizzy beverages, sodas), raw fruits and raw vegetables (uncooked), or beans.  ? ?If your bowels are filled with gas, your surgeon will have difficulty visualizing your pelvic organs which increases your surgical risks. ? ?Your role in recovery ?Your role is to become active as soon as directed by your doctor, while still giving yourself time to heal.  Rest when you feel tired. You will be asked to do the following in order to speed your recovery: ? ?- Cough and breathe deeply. This helps to clear and expand your lungs and can prevent pneumonia after surgery.  ?- STAY ACTIVE WHEN YOU GET HOME. Do mild physical activity. Walking or moving your legs help your circulation and body functions return to normal. Do not try to get up or walk alone the first time after surgery.   ?-If you develop swelling on one leg or the other, pain in the back of your leg, redness/warmth in one of your legs, please call the office or go to the Emergency Room to have a doppler to rule out a blood clot. For shortness of breath, chest pain-seek care in the Emergency  Room as soon as possible. ?- Actively manage your pain. Managing your pain lets you move in comfort. We will ask you to rate your pain on a scale of zero to 10. It is your responsibility to tell your doctor or nurse where and how much you hurt so your pain can be treated. ? ?Special Considerations ?-If you are diabetic, you may be placed on insulin after surgery to have closer control over your blood sugars to promote healing and recovery.  This does not mean that you will be discharged on  insulin.  If applicable, your oral antidiabetics will be resumed when you are tolerating a solid diet. ? ?-Your final pathology results from surgery should be available around one week after surgery and the results will be relayed to you when available. ? ?-FMLA forms can be faxed to [phone] and please allow 5-7 business days for completion. ? ?Pain Management After Surgery ?-Make sure that you have Tylenol and Ibuprofen at home if you are able to take these medications to use on a regular basis after surgery for pain control. We recommend alternating the medications every hour to six hours since they work differently and are processed in the body differently for pain relief. ? ?-Review the attached handout on narcotic use and their risks and side effects.  ? ?Bowel Regimen ?-You will be prescribed Sennakot-S to take nightly to prevent constipation especially if you are taking the narcotic pain medication intermittently.  It is important to prevent constipation and drink adequate amounts of liquids. You can stop taking this medication when you are not taking pain medication and you are back on your normal bowel routine. ? ?Risks of Surgery ?Risks of surgery are low but include bleeding, infection, damage to surrounding structures, re-operation, blood clots, and very rarely death. ? ? ?Blood Transfusion Information (For the consent to be signed before surgery) ? ?We will be checking your blood type before surgery so in case of emergencies, we will know what type of blood you would need. ? ?                                          WHAT IS A BLOOD TRANSFUSION? ? ?A transfusion is the replacement of blood or some of its parts. Blood is made up of multiple cells which provide different functions. ?Red blood cells carry oxygen and are used for blood loss replacement. ?White blood cells fight against infection. ?Platelets control bleeding. ?Plasma helps clot blood. ?Other blood products are available for specialized  needs, such as hemophilia or other clotting disorders. ?BEFORE THE TRANSFUSION  ?Who gives blood for transfusions?  ?You may be able to donate blood to be used at a later date on yourself (autologous donation). ?Relatives can be asked to donate blood. This is generally not any safer than if you have received blood from a stranger. The same precautions are taken to ensure safety when a relative's blood is donated. ?Healthy volunteers who are fully evaluated to make sure their blood is safe. This is blood bank blood. ?Transfusion therapy is the safest it has ever been in the practice of medicine. Before blood is taken from a donor, a complete history is taken to make sure that person has no history of diseases nor engages in risky social behavior (examples are intravenous drug use or sexual activity with multiple partners). The donor's travel history is  screened to minimize risk of transmitting infections, such as malaria. The donated blood is tested for signs of infectious diseases, such as HIV and hepatitis. The blood is then tested to be sure it is compatible with you in order to minimize the chance of a transfusion reaction. If you or a relative donates blood, this is often done in anticipation of surgery and is not appropriate for emergency situations. It takes many days to process the donated blood. ?RISKS AND COMPLICATIONS ?Although transfusion therapy is very safe and saves many lives, the main dangers of transfusion include:  ?Getting an infectious disease. ?Developing a transfusion reaction. This is an allergic reaction to something in the blood you were given. Every precaution is taken to prevent this. ?The decision to have a blood transfusion has been considered carefully by your caregiver before blood is given. Blood is not given unless the benefits outweigh the risks. ? ?AFTER SURGERY INSTRUCTIONS ? ?Return to work: 4-6 weeks if applicable ? ?Activity: ?1. Be up and out of the bed during the day.  Take a  nap if needed.  You may walk up steps but be careful and use the hand rail.  Stair climbing will tire you more than you think, you may need to stop part way and rest.  ? ?2. No lifting or straining for

## 2021-11-07 ENCOUNTER — Telehealth: Payer: Self-pay

## 2021-11-07 ENCOUNTER — Telehealth: Payer: Self-pay | Admitting: *Deleted

## 2021-11-07 LAB — CA 125: Cancer Antigen (CA) 125: 23.6 U/mL (ref 0.0–38.1)

## 2021-11-07 NOTE — Telephone Encounter (Signed)
Spoke with patients husband [name]. Per Dr. Berline Lopes notified him that tumor markers (CEA & CA-125) are normal. He verbalized understanding and states " I'll let my wife know when I get home form work." ?Advised to call with any needs.  ?

## 2021-11-07 NOTE — Telephone Encounter (Signed)
Per Dr Berline Lopes fax surgical optimization form to her PCP and neurology offices ?

## 2021-11-08 NOTE — Telephone Encounter (Signed)
Received surgical optimization from from the patient's PCP. Copy sent to be scanned in chart and copy fax to pre admission office  ?

## 2021-11-22 NOTE — Progress Notes (Addendum)
Anesthesia Review: ? ?PCP: Eilene Ghazi with [facility]  Clearance on chart dated 11/08/21.  ?Cardiologist : none  ?DR Lovena Neighbours- Urologist  ?Neuro- Dr Mart Piggs 07/10/21  ?Chest x-ray : ?EKG : 12/09/20  ?Echo : 2020  ?Stress test: ?Cardiac Cath :  ?Activity level: cannot do a flight of stairs  ?Sleep Study/ CPAP : none  ?Fasting Blood Sugar :      / Checks Blood Sugar -- times a day:   ?Blood Thinner/ Instructions /Last Dose: ?ASA / Instructions/ Last Dose :   ?AT preop appt pt in wheelchair. Uses quad cane at home with limited mobiltiy .  PT has use of left hand.  Aphasic.  Can write some with left hand.  States " no" when asking some questions .  Becomes very agitated easily at preop appt with husband.  Struck at husband during preop appt more than once.  Last name corrected at preop appt with husband at [facility] after pt and husband had been to admitting initially.  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  Husband needs to be with pt in preop area.  PT becomes agitated quickly.  Husband signed consent forms at preop appt. Called and spoke with OB/GYN ( lacy) after preop appt to inform that pt becomes agitated easily and that only part of blood was obtained at preop ) CBC , maybe CMP) Type and Screen will need to be done am of surgery.  Informed Natale Milch that final outcome of blood results would call her with . Results.  Natale Milch voiced understanding.  PT would only let lab obtain blood from right arm. ?Spoke with lab.  CBC was obtained and resulted.  Not enough blood for the CMP.  CMP and type and screen will be done am of surgery. Spoke with Natale Milch at [facility] and made aware.   Janett Billow [facility] made aware only able to result CBC at preop visit.    ?Husband does not have health Care POA.  Per husband.  ? ?

## 2021-11-22 NOTE — Progress Notes (Signed)
DUE TO COVID-19 ONLY  2  VISITOR IS ALLOWED TO COME WITH YOU AND STAY IN THE WAITING ROOM ONLY DURING PRE OP AND PROCEDURE DAY OF SURGERY.  2 4 VISITOR  MAY VISIT WITH YOU AFTER SURGERY IN YOUR PRIVATE ROOM DURING VISITING HOURS ONLY! ?YOU MAY HAVE ONE PERSON SPEND THE NITE WITH YOU IN YOUR ROOM AFTER SURGERY.   ? ? ? ? Your procedure is scheduled on:  ?  12/06/2021  ? Report to [facility] Main  Entrance ? ? Report to admitting at        0615AM  ?DO NOT BRING INSURANCE CARD, PICTURE ID OR WALLET DAY OF SURGERY.  ?  ? ? Call this number if you have problems the morning of surgery [phone]  ? ? REMEMBER: NO  SOLID FOODS , CANDY, GUM OR MINTS AFTER MIDNITE THE NITE BEFORE SURGERY .       Marland Kitchen CLEAR LIQUIDS UNTIL   0530AM              DAY OF SURGERY.      PLEASE FINISH ENSURE DRINK PER SURGEON ORDER  WHICH NEEDS TO BE COMPLETED AT  0530AM         MORNING OF SURGERY.   ? ? ?Eat a lgiht diet the day before surgery.  Avid gas producing foods   ? ?CLEAR LIQUID DIET ? ? ?Foods Allowed      ?WATER ?BLACK COFFEE ( SUGAR OK, NO MILK, CREAM OR CREAMER) REGULAR AND DECAF  ?TEA ( SUGAR OK NO MILK, CREAM, OR CREAMER) REGULAR AND DECAF  ?PLAIN JELLO ( NO RED)  ?FRUIT ICES ( NO RED, NO FRUIT PULP)  ?POPSICLES ( NO RED)  ?JUICE- APPLE, WHITE GRAPE AND WHITE CRANBERRY  ?SPORT DRINK LIKE GATORADE ( NO RED)  ?CLEAR BROTH ( VEGETABLE , CHICKEN OR BEEF)                                                               ? ?    ? ?BRUSH YOUR TEETH MORNING OF SURGERY AND RINSE YOUR MOUTH OUT, NO CHEWING GUM CANDY OR MINTS. ?  ? ? Take these medicines the morning of surgery with A SIP OF WATER:  amlodipine  ? ? ?DO NOT TAKE ANY DIABETIC MEDICATIONS DAY OF YOUR SURGERY ?                  ?            You may not have any metal on your body including hair pins and  ?            piercings  Do not wear jewelry, make-up, lotions, powders or perfumes, deodorant ?            Do not wear nail polish on your fingernails.   ?           IF YOU ARE A  FEMALE AND WANT TO SHAVE UNDER ARMS OR LEGS PRIOR TO SURGERY YOU MUST DO SO AT LEAST 48 HOURS PRIOR TO SURGERY.  ?            Men may shave face and neck. ? ? Do not bring valuables to the hospital. [place] [facility] NOT ?  RESPONSIBLE   FOR VALUABLES. ? Contacts, dentures or bridgework may not be worn into surgery. ? Leave suitcase in the car. After surgery it may be brought to your room. ? ?  ? Patients discharged the day of surgery will not be allowed to drive home. IF YOU ARE HAVING SURGERY AND GOING HOME THE SAME DAY, YOU MUST HAVE AN ADULT TO DRIVE YOU HOME AND BE WITH YOU FOR 24 HOURS. YOU MAY GO HOME BY TAXI OR UBER OR ORTHERWISE, BUT AN ADULT MUST ACCOMPANY YOU HOME AND STAY WITH YOU FOR 24 HOURS. ?  ? ?            Please read over the following fact sheets you were given: ?_____________________________________________________________________ ? ?[place] - Preparing for Surgery ?Before surgery, you can play an important role.  Because skin is not sterile, your skin needs to be as free of germs as possible.  You can reduce the number of germs on your skin by washing with CHG (chlorahexidine gluconate) soap before surgery.  CHG is an antiseptic cleaner which kills germs and bonds with the skin to continue killing germs even after washing. ?Please DO NOT use if you have an allergy to CHG or antibacterial soaps.  If your skin becomes reddened/irritated stop using the CHG and inform your nurse when you arrive at Short Stay. ?Do not shave (including legs and underarms) for at least 48 hours prior to the first CHG shower.  You may shave your face/neck. ?Please follow these instructions carefully: ? 1.  Shower with CHG Soap the night before surgery and the  morning of Surgery. ? 2.  If you choose to wash your hair, wash your hair first as usual with your  normal  shampoo. ? 3.  After you shampoo, rinse your hair and body thoroughly to remove the  shampoo.                           4.  Use CHG as you would  any other liquid soap.  You can apply chg directly  to the skin and wash  ?                     Gently with a scrungie or clean washcloth. ? 5.  Apply the CHG Soap to your body ONLY FROM THE NECK DOWN.   Do not use on face/ open      ?                     Wound or open sores. Avoid contact with eyes, ears mouth and genitals (private parts).  ?                     Production manager,  Genitals (private parts) with your normal soap. ?            6.  Wash thoroughly, paying special attention to the area where your surgery  will be performed. ? 7.  Thoroughly rinse your body with warm water from the neck down. ? 8.  DO NOT shower/wash with your normal soap after using and rinsing off  the CHG Soap. ?               9.  Pat yourself dry with a clean towel. ?           10.  Wear clean pajamas. ?  11.  Place clean sheets on your bed the night of your first shower and do not  sleep with pets. ?Day of Surgery : ?Do not apply any lotions/deodorants the morning of surgery.  Please wear clean clothes to the hospital/surgery center. ? ?FAILURE TO FOLLOW THESE INSTRUCTIONS MAY RESULT IN THE CANCELLATION OF YOUR SURGERY ?PATIENT SIGNATURE_________________________________ ? ?NURSE SIGNATURE__________________________________ ? ?________________________________________________________________________  ? ? ?           ?

## 2021-11-24 ENCOUNTER — Encounter (HOSPITAL_COMMUNITY)
Admission: RE | Admit: 2021-11-24 | Discharge: 2021-11-24 | Disposition: A | Discharge status: Home / Self Care | Payer: Managed Care, Other (non HMO) | Source: Ambulatory Visit | Attending: Gynecologic Oncology | Admitting: Gynecologic Oncology

## 2021-11-24 ENCOUNTER — Other Ambulatory Visit: Payer: Self-pay

## 2021-11-24 ENCOUNTER — Encounter (HOSPITAL_COMMUNITY): Payer: Self-pay

## 2021-11-24 VITALS — BP 153/93 | HR 78 | Temp 98.4°F | Resp 16 | Ht 67.5 in

## 2021-11-24 DIAGNOSIS — I10 Essential (primary) hypertension: Secondary | ICD-10-CM | POA: Diagnosis not present

## 2021-11-24 DIAGNOSIS — Z01812 Encounter for preprocedural laboratory examination: Secondary | ICD-10-CM | POA: Insufficient documentation

## 2021-11-24 DIAGNOSIS — R1900 Intra-abdominal and pelvic swelling, mass and lump, unspecified site: Secondary | ICD-10-CM | POA: Diagnosis not present

## 2021-11-24 HISTORY — DX: Depression, unspecified: F32.A

## 2021-11-24 LAB — CBC
HCT: 47.2 % — ABNORMAL HIGH (ref 36.0–46.0)
Hemoglobin: 14.9 g/dL (ref 12.0–15.0)
MCH: 30.2 pg (ref 26.0–34.0)
MCHC: 31.6 g/dL (ref 30.0–36.0)
MCV: 95.7 fL (ref 80.0–100.0)
Platelets: 162 10*3/uL (ref 150–400)
RBC: 4.93 MIL/uL (ref 3.87–5.11)
RDW: 13.8 % (ref 11.5–15.5)
WBC: 6.6 10*3/uL (ref 4.0–10.5)
nRBC: 0 % (ref 0.0–0.2)

## 2021-11-30 NOTE — Progress Notes (Signed)
Anesthesia Chart Review ? ? Case: [id number] Date/Time: 12/06/21 0815  ? Procedure: XI ROBOTIC ASSISTED BILATERAL SALPINGO OOPHORECTOMY,  POSSIBLE TOTAL HYSTERECTOMY, POSSIBLE STAGING, POSSIBLE LAPARTOMY, POSSIBLE PAP SMEAR (Bilateral)  ? Anesthesia type: General  ? Pre-op diagnosis: PELVIC MASSES  ? Location: [facility] ROOM 05 / [facility]  ? Surgeons: [name], MD  ? ?  ? ? ?DISCUSSION:56 y.o. 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 Jeral Pinch.  ? ?Clearance from PCP on chart which states pt is low risk for planned procedure.  ? ?Pt last seen by neurologist 07/10/2021.  Ok to proceed per neurologist.   ? ?Please see PAT nurse's note.  Per PAT nurse pt becomes agitated easily.  Husband is caregiver.  ? ?Anticipate pt can proceed with planned procedure barring acute status change.   ?VS: BP (!) 153/93   Pulse 78   Temp 36.9 ?C (Oral)   Resp 16   Ht 5' 7.5" (1.715 m)   SpO2 100%   BMI 22.38 kg/m?  ? ?PROVIDERS: ?Eilene Ghazi, NP ? ? ?LABS: Labs reviewed: Acceptable for surgery. ?(all labs ordered are listed, but only abnormal results are displayed) ? ?Labs Reviewed  ?CBC - Abnormal; Notable for the following components:  ?    Result Value  ? HCT 47.2 (*)   ? All other components within normal limits  ? ? ? ?IMAGES: ? ? ?EKG: ?12/09/2020 ?Rate 107 bpm  ?Sinus tachycardia  ? ?CV: ?Echo 07/10/2019 ?1. Left ventricular ejection fraction, by visual estimation, is 60 to  ?65%. The left ventricle has normal function. There is no left ventricular  ?hypertrophy.  ? 2. The left ventricle has no regional wall motion abnormalities.  ? 3. Global right ventricle has normal systolic function.The right  ?ventricular size is small. No increase in right ventricular wall  ?thickness.  ? 4. Left atrial size was normal.  ? 5. Right atrial size was normal.  ? 6. The  mitral valve is normal in structure. No evidence of mitral valve  ?regurgitation.  ? 7. The tricuspid valve is normal in structure. Tricuspid valve  ?regurgitation is trivial.  ? 8. The aortic valve is normal in structure. Aortic valve regurgitation is  ?not visualized.  ? 9. The pulmonic valve was normal in structure. Pulmonic valve  ?regurgitation is not visualized.  ?10. The atrial septum is grossly normal.  ?Past Medical History:  ?Diagnosis Date  ? Allergy   ? Anxiety   ? Depression   ? Expressive aphasia   ? Hemiparesis ([place])   ? Right side  ? High triglycerides   ? History of kidney stones   ? Kidney stones   ? Recurrent sinus infections   ? Stroke [facility])   ? ? ?Past Surgical History:  ?Procedure Laterality Date  ? ANTERIOR CRUCIATE LIGAMENT REPAIR Left   ? BUNIONECTOMY Right   ? CYSTOSCOPY/URETEROSCOPY/HOLMIUM LASER/STENT PLACEMENT Right 02/08/2021  ? Procedure: CYSTOSCOPY/RETROGRADE/URETEROSCOPY/HOLMIUM LASER/STENT PLACEMENT, NEPHROSTOMY TUBE REMOVAL;  Surgeon: Ceasar Mons, MD;  Location: [facility];  Service: Urology;  Laterality: Right;  ? IR NEPHROSTOMY PLACEMENT RIGHT  12/09/2020  ? LASIK    ? LIPOSUCTION    ? LITHOTRIPSY    ? MENISCUS REPAIR Left   ? NASAL SEPTUM SURGERY    ? SHOULDER SURGERY    ? VARICOSE VEIN SURGERY Left   ? ? ?MEDICATIONS: ? amLODipine (  NORVASC) 10 MG tablet  ? atorvastatin (LIPITOR) 20 MG tablet  ? B Complex-C (B-COMPLEX WITH VITAMIN C) tablet  ? CALCIUM PO  ? Cholecalciferol (VITAMIN D) 50 MCG (2000 UT) CAPS  ? docusate sodium (COLACE) 100 MG capsule  ? ferrous sulfate 325 (65 FE) MG EC tablet  ? magnesium 30 MG tablet  ? Omega-3 Fatty Acids (FISH OIL) 1200 MG CPDR  ? zinc gluconate 50 MG tablet  ? ?No current facility-administered medications for this encounter.  ? ? ?Konrad Felix Ward, PA-C ?[facility] ?[phone] ? ? ? ? ? ? ?

## 2021-11-30 NOTE — Anesthesia Preprocedure Evaluation (Addendum)
Anesthesia Evaluation  ?Patient identified by MRN, date of birth, ID band ?Patient awake ? ? ? ?Reviewed: ?Allergy & Precautions, NPO status , Patient's Chart, lab work & pertinent test results ? ?Airway ?Mallampati: II ? ?TM Distance: >3 FB ?Neck ROM: Full ? ? ? Dental ?no notable dental hx. ?(+) Teeth Intact, Dental Advisory Given ?  ?Pulmonary ?former smoker,  ?  ?Pulmonary exam normal ?breath sounds clear to auscultation ? ? ? ? ? ? Cardiovascular ?hypertension, Normal cardiovascular exam ?Rhythm:Regular Rate:Normal ? ?Echo 07/10/2019 ?1. Left ventricular ejection fraction, by visual estimation, is 60 to  ?65%. The left ventricle has normal function. There is no left ventricular  ?hypertrophy.  ??2. The left ventricle has no regional wall motion abnormalities.  ?  ?Neuro/Psych ? Headaches, Anxiety CVA (R hemiparesis w expressive aphasia), Residual Symptoms   ? GI/Hepatic ?negative GI ROS, Neg liver ROS,   ?Endo/Other  ? ? Renal/GU ?Renal diseaseHx of kidney stones  ? ?Pelvic mass ? ?  ?Musculoskeletal ? ? Abdominal ?  ?Peds ? Hematology ?Lab Results ?     Component                Value               Date                 ?     WBC                      6.6                 11/24/2021           ?     HGB                      14.9                11/24/2021           ?     HCT                      47.2 (H)            11/24/2021           ?     MCV                      95.7                11/24/2021           ?     PLT                      162                 11/24/2021           ?   ?Anesthesia Other Findings ?All Amoxicillin ? Reproductive/Obstetrics ? ?  ? ? ? ? ? ? ? ? ? ? ? ? ? ?  ?  ? ? ? ? ? ? ?Anesthesia Physical ?Anesthesia Plan ? ?ASA: 3 ? ?Anesthesia Plan: General  ? ?Post-op Pain Management: Lidocaine infusion*, Ketamine IV* and Ofirmev IV (intra-op)*  ? ?Induction: Intravenous ? ?PONV Risk Score and Plan: 4 or greater and Treatment may vary due to age or medical condition,  Midazolam, Ondansetron and Dexamethasone ? ?Airway Management Planned: Oral ETT ? ?Additional  Equipment: None ? ?Intra-op Plan:  ? ?Post-operative Plan: Extubation in OR ? ?Informed Consent: I have reviewed the patients History and Physical, chart, labs and discussed the procedure including the risks, benefits and alternatives for the proposed anesthesia with the patient or authorized representative who has indicated his/her understanding and acceptance.  ? ? ? ?Dental advisory given ? ?Plan Discussed with: CRNA and Anesthesiologist ? ?Anesthesia Plan Comments: (See PAT note 11/24/2021 ? ?GA w 2 large bore 18g and lido infusion w ketamine)  ? ? ? ? ?Anesthesia Quick Evaluation ? ?

## 2021-12-05 ENCOUNTER — Other Ambulatory Visit: Payer: Self-pay | Admitting: Gynecologic Oncology

## 2021-12-05 ENCOUNTER — Telehealth: Payer: Self-pay | Admitting: *Deleted

## 2021-12-05 DIAGNOSIS — N9489 Other specified conditions associated with female genital organs and menstrual cycle: Secondary | ICD-10-CM

## 2021-12-05 MED ORDER — TRAMADOL HCL 50 MG PO TABS: 50.0000 mg | ORAL_TABLET | Freq: Four times a day (QID) | ORAL | 0 refills | Status: DC | PRN

## 2021-12-05 MED ORDER — SENNOSIDES-DOCUSATE SODIUM 8.6-50 MG PO TABS: 2.0000 | ORAL_TABLET | Freq: Every day | ORAL | 0 refills | Status: DC

## 2021-12-05 NOTE — Progress Notes (Signed)
Postop meds prescribed preoperatively. ?

## 2021-12-05 NOTE — Telephone Encounter (Signed)
Telephone call to check on pre-operative status.  Patient compliant with pre-operative instructions.  Reinforced nothing to eat after midnight. Clear liquids until 0530. Patient to arrive at 0615.  No questions or concerns voiced.  Instructed to call for any needs.  

## 2021-12-06 ENCOUNTER — Ambulatory Visit (HOSPITAL_COMMUNITY)
Admission: RE | Admit: 2021-12-06 | Discharge: 2021-12-08 | Disposition: A | Discharge status: Home / Self Care | Payer: Managed Care, Other (non HMO) | Attending: Gynecologic Oncology | Admitting: Gynecologic Oncology

## 2021-12-06 ENCOUNTER — Ambulatory Visit (HOSPITAL_BASED_OUTPATIENT_CLINIC_OR_DEPARTMENT_OTHER): Payer: Managed Care, Other (non HMO) | Admitting: Anesthesiology

## 2021-12-06 ENCOUNTER — Encounter (HOSPITAL_COMMUNITY)
Admission: RE | Disposition: A | Discharge status: Home / Self Care | Payer: Self-pay | Source: Home / Self Care | Attending: Gynecologic Oncology

## 2021-12-06 ENCOUNTER — Ambulatory Visit (HOSPITAL_COMMUNITY): Payer: Managed Care, Other (non HMO) | Admitting: Physician Assistant

## 2021-12-06 ENCOUNTER — Other Ambulatory Visit: Payer: Self-pay

## 2021-12-06 ENCOUNTER — Encounter (HOSPITAL_COMMUNITY): Payer: Self-pay | Admitting: Gynecologic Oncology

## 2021-12-06 ENCOUNTER — Inpatient Hospital Stay (HOSPITAL_COMMUNITY): Admission: RE | Admit: 2021-12-06 | Payer: Managed Care, Other (non HMO) | Source: Ambulatory Visit

## 2021-12-06 DIAGNOSIS — N838 Other noninflammatory disorders of ovary, fallopian tube and broad ligament: Secondary | ICD-10-CM | POA: Diagnosis present

## 2021-12-06 DIAGNOSIS — I69351 Hemiplegia and hemiparesis following cerebral infarction affecting right dominant side: Secondary | ICD-10-CM | POA: Insufficient documentation

## 2021-12-06 DIAGNOSIS — R141 Gas pain: Secondary | ICD-10-CM

## 2021-12-06 DIAGNOSIS — I10 Essential (primary) hypertension: Secondary | ICD-10-CM | POA: Insufficient documentation

## 2021-12-06 DIAGNOSIS — C7963 Secondary malignant neoplasm of bilateral ovaries: Secondary | ICD-10-CM | POA: Insufficient documentation

## 2021-12-06 DIAGNOSIS — R1900 Intra-abdominal and pelvic swelling, mass and lump, unspecified site: Secondary | ICD-10-CM

## 2021-12-06 DIAGNOSIS — Z87442 Personal history of urinary calculi: Secondary | ICD-10-CM | POA: Diagnosis not present

## 2021-12-06 DIAGNOSIS — N858 Other specified noninflammatory disorders of uterus: Secondary | ICD-10-CM | POA: Diagnosis not present

## 2021-12-06 DIAGNOSIS — C181 Malignant neoplasm of appendix: Secondary | ICD-10-CM | POA: Diagnosis not present

## 2021-12-06 DIAGNOSIS — C786 Secondary malignant neoplasm of retroperitoneum and peritoneum: Secondary | ICD-10-CM | POA: Diagnosis not present

## 2021-12-06 DIAGNOSIS — F419 Anxiety disorder, unspecified: Secondary | ICD-10-CM | POA: Diagnosis not present

## 2021-12-06 DIAGNOSIS — C269 Malignant neoplasm of ill-defined sites within the digestive system: Secondary | ICD-10-CM

## 2021-12-06 DIAGNOSIS — Z79899 Other long term (current) drug therapy: Secondary | ICD-10-CM | POA: Diagnosis not present

## 2021-12-06 DIAGNOSIS — K589 Irritable bowel syndrome without diarrhea: Secondary | ICD-10-CM | POA: Diagnosis not present

## 2021-12-06 DIAGNOSIS — I69320 Aphasia following cerebral infarction: Secondary | ICD-10-CM | POA: Diagnosis not present

## 2021-12-06 HISTORY — PX: ROBOTIC ASSISTED TOTAL HYSTERECTOMY WITH BILATERAL SALPINGO OOPHERECTOMY: SHX6086

## 2021-12-06 LAB — COMPREHENSIVE METABOLIC PANEL
ALT: 17 U/L (ref 0–44)
AST: 47 U/L — ABNORMAL HIGH (ref 15–41)
Albumin: 3.1 g/dL — ABNORMAL LOW (ref 3.5–5.0)
Alkaline Phosphatase: 55 U/L (ref 38–126)
Anion gap: 7 (ref 5–15)
BUN: 8 mg/dL (ref 6–20)
CO2: 22 mmol/L (ref 22–32)
Calcium: 8.1 mg/dL — ABNORMAL LOW (ref 8.9–10.3)
Chloride: 108 mmol/L (ref 98–111)
Creatinine, Ser: 0.72 mg/dL (ref 0.44–1.00)
GFR, Estimated: >60 mL/min (ref >60)
Glucose, Bld: 96 mg/dL (ref 70–99)
Potassium: 4.8 mmol/L (ref 3.5–5.1)
Sodium: 137 mmol/L (ref 135–145)
Total Bilirubin: 1.4 mg/dL — ABNORMAL HIGH (ref 0.3–1.2)
Total Protein: 5.7 g/dL — ABNORMAL LOW (ref 6.5–8.1)

## 2021-12-06 LAB — TYPE AND SCREEN
ABO/RH(D): A NEG
Antibody Screen: NEGATIVE

## 2021-12-06 LAB — ABO/RH: ABO/RH(D): A NEG

## 2021-12-06 SURGERY — HYSTERECTOMY, TOTAL, ROBOT-ASSISTED, LAPAROSCOPIC, WITH BILATERAL SALPINGO-OOPHORECTOMY
Anesthesia: General | Site: Abdomen | Laterality: Bilateral

## 2021-12-06 MED ORDER — KETAMINE HCL 50 MG/5ML IJ SOSY
PREFILLED_SYRINGE | INTRAMUSCULAR | Status: AC
  Filled 2021-12-06: qty 5

## 2021-12-06 MED ORDER — ROCURONIUM BROMIDE 10 MG/ML (PF) SYRINGE
PREFILLED_SYRINGE | INTRAVENOUS | Status: AC
  Filled 2021-12-06: qty 10

## 2021-12-06 MED ORDER — BUPIVACAINE HCL 0.25 % IJ SOLN
INTRAMUSCULAR | Status: DC | PRN
  Administered 2021-12-06: 20 mL
  Administered 2021-12-06: 30 mL

## 2021-12-06 MED ORDER — HEPARIN SODIUM (PORCINE) 5000 UNIT/ML IJ SOLN
5000.0000 [IU] | INTRAMUSCULAR | Status: AC
  Administered 2021-12-06: 5000 [IU] via SUBCUTANEOUS
  Filled 2021-12-06: qty 1

## 2021-12-06 MED ORDER — ENSURE PRE-SURGERY PO LIQD: 296.0000 mL | Freq: Once | ORAL | Status: DC

## 2021-12-06 MED ORDER — ORAL CARE MOUTH RINSE: 15.0000 mL | Freq: Once | OROMUCOSAL | Status: AC

## 2021-12-06 MED ORDER — ATORVASTATIN CALCIUM 20 MG PO TABS
20.0000 mg | ORAL_TABLET | Freq: Every day | ORAL | Status: DC
  Administered 2021-12-06 – 2021-12-07 (×2): 20 mg via ORAL
  Filled 2021-12-06 (×2): qty 1

## 2021-12-06 MED ORDER — ONDANSETRON HCL 4 MG/2ML IJ SOLN: 4.0000 mg | Freq: Four times a day (QID) | INTRAMUSCULAR | Status: DC | PRN

## 2021-12-06 MED ORDER — HYDROMORPHONE HCL 1 MG/ML IJ SOLN
0.2500 mg | INTRAMUSCULAR | Status: DC | PRN
  Administered 2021-12-06: 0.5 mg via INTRAVENOUS

## 2021-12-06 MED ORDER — KETOROLAC TROMETHAMINE 30 MG/ML IJ SOLN
INTRAMUSCULAR | Status: AC
  Filled 2021-12-06: qty 1

## 2021-12-06 MED ORDER — LIDOCAINE 2% (20 MG/ML) 5 ML SYRINGE
INTRAMUSCULAR | Status: DC | PRN
  Administered 2021-12-06: 1.5 mg/kg/h via INTRAVENOUS

## 2021-12-06 MED ORDER — BUPIVACAINE HCL 0.25 % IJ SOLN
INTRAMUSCULAR | Status: AC
  Filled 2021-12-06: qty 1

## 2021-12-06 MED ORDER — CEFAZOLIN SODIUM-DEXTROSE 2-3 GM-%(50ML) IV SOLR
INTRAVENOUS | Status: DC | PRN
  Administered 2021-12-06: 2 g via INTRAVENOUS

## 2021-12-06 MED ORDER — CEFAZOLIN SODIUM-DEXTROSE 2-4 GM/100ML-% IV SOLN
INTRAVENOUS | Status: AC
  Filled 2021-12-06: qty 100

## 2021-12-06 MED ORDER — ONDANSETRON HCL 4 MG/2ML IJ SOLN
INTRAMUSCULAR | Status: AC
  Filled 2021-12-06: qty 2

## 2021-12-06 MED ORDER — OXYCODONE HCL 5 MG/5ML PO SOLN: 5.0000 mg | Freq: Once | ORAL | Status: DC | PRN

## 2021-12-06 MED ORDER — HYDROMORPHONE HCL 1 MG/ML IJ SOLN: 0.2000 mg | INTRAMUSCULAR | Status: DC | PRN

## 2021-12-06 MED ORDER — KETAMINE HCL 10 MG/ML IJ SOLN
INTRAMUSCULAR | Status: DC | PRN
  Administered 2021-12-06: 30 mg via INTRAVENOUS

## 2021-12-06 MED ORDER — LACTATED RINGERS IV SOLN: INTRAVENOUS | Status: DC

## 2021-12-06 MED ORDER — ONDANSETRON HCL 4 MG PO TABS: 4.0000 mg | ORAL_TABLET | Freq: Four times a day (QID) | ORAL | Status: DC | PRN

## 2021-12-06 MED ORDER — LACTATED RINGERS IR SOLN
Status: DC | PRN
  Administered 2021-12-06: 1000 mL

## 2021-12-06 MED ORDER — ENOXAPARIN SODIUM 40 MG/0.4ML IJ SOSY
40.0000 mg | PREFILLED_SYRINGE | INTRAMUSCULAR | Status: DC
  Administered 2021-12-07 – 2021-12-08 (×2): 40 mg via SUBCUTANEOUS
  Filled 2021-12-06 (×2): qty 0.4

## 2021-12-06 MED ORDER — SENNOSIDES-DOCUSATE SODIUM 8.6-50 MG PO TABS
2.0000 | ORAL_TABLET | Freq: Every day | ORAL | Status: DC
  Administered 2021-12-06 – 2021-12-07 (×2): 2 via ORAL
  Filled 2021-12-06 (×2): qty 2

## 2021-12-06 MED ORDER — FENTANYL CITRATE (PF) 100 MCG/2ML IJ SOLN
INTRAMUSCULAR | Status: AC
  Filled 2021-12-06: qty 2

## 2021-12-06 MED ORDER — PROPOFOL 10 MG/ML IV BOLUS
INTRAVENOUS | Status: AC
Start: 2021-12-06 — End: ?
  Filled 2021-12-06: qty 20

## 2021-12-06 MED ORDER — BUPIVACAINE LIPOSOME 1.3 % IJ SUSP
INTRAMUSCULAR | Status: DC | PRN
  Administered 2021-12-06: 20 mL

## 2021-12-06 MED ORDER — SUGAMMADEX SODIUM 200 MG/2ML IV SOLN
INTRAVENOUS | Status: DC | PRN
  Administered 2021-12-06: 200 mg via INTRAVENOUS

## 2021-12-06 MED ORDER — ENOXAPARIN (LOVENOX) PATIENT EDUCATION KIT
PACK | Freq: Once | Status: AC
  Filled 2021-12-06: qty 1

## 2021-12-06 MED ORDER — LIDOCAINE HCL (PF) 2 % IJ SOLN
INTRAMUSCULAR | Status: AC
  Filled 2021-12-06: qty 15

## 2021-12-06 MED ORDER — STERILE WATER FOR IRRIGATION IR SOLN
Status: DC | PRN
  Administered 2021-12-06: 1000 mL

## 2021-12-06 MED ORDER — PROPOFOL 10 MG/ML IV BOLUS
INTRAVENOUS | Status: DC | PRN
  Administered 2021-12-06: 120 mg via INTRAVENOUS

## 2021-12-06 MED ORDER — DEXAMETHASONE SODIUM PHOSPHATE 10 MG/ML IJ SOLN
INTRAMUSCULAR | Status: AC
  Filled 2021-12-06: qty 1

## 2021-12-06 MED ORDER — FENTANYL CITRATE (PF) 100 MCG/2ML IJ SOLN
INTRAMUSCULAR | Status: DC | PRN
  Administered 2021-12-06: 50 ug via INTRAVENOUS
  Administered 2021-12-06: 100 ug via INTRAVENOUS
  Administered 2021-12-06 (×3): 50 ug via INTRAVENOUS

## 2021-12-06 MED ORDER — BUPIVACAINE LIPOSOME 1.3 % IJ SUSP
INTRAMUSCULAR | Status: AC
  Filled 2021-12-06: qty 20

## 2021-12-06 MED ORDER — KCL IN DEXTROSE-NACL 10-5-0.45 MEQ/L-%-% IV SOLN
INTRAVENOUS | Status: DC
  Filled 2021-12-06 (×2): qty 1000

## 2021-12-06 MED ORDER — TRAMADOL HCL 50 MG PO TABS: 50.0000 mg | ORAL_TABLET | Freq: Four times a day (QID) | ORAL | Status: DC | PRN

## 2021-12-06 MED ORDER — MIDAZOLAM HCL 2 MG/2ML IJ SOLN
INTRAMUSCULAR | Status: AC
  Filled 2021-12-06: qty 2

## 2021-12-06 MED ORDER — MIDAZOLAM HCL 5 MG/5ML IJ SOLN
INTRAMUSCULAR | Status: DC | PRN
  Administered 2021-12-06: 2 mg via INTRAVENOUS

## 2021-12-06 MED ORDER — ACETAMINOPHEN 500 MG PO TABS
500.0000 mg | ORAL_TABLET | Freq: Four times a day (QID) | ORAL | Status: DC
  Administered 2021-12-06 – 2021-12-08 (×6): 500 mg via ORAL
  Filled 2021-12-06 (×6): qty 1

## 2021-12-06 MED ORDER — HYDROMORPHONE HCL 1 MG/ML IJ SOLN
INTRAMUSCULAR | Status: AC
  Filled 2021-12-06: qty 1

## 2021-12-06 MED ORDER — OXYCODONE HCL 5 MG PO TABS: 5.0000 mg | ORAL_TABLET | Freq: Once | ORAL | Status: DC | PRN

## 2021-12-06 MED ORDER — ROCURONIUM BROMIDE 10 MG/ML (PF) SYRINGE
PREFILLED_SYRINGE | INTRAVENOUS | Status: DC | PRN
  Administered 2021-12-06: 30 mg via INTRAVENOUS
  Administered 2021-12-06: 20 mg via INTRAVENOUS
  Administered 2021-12-06: 60 mg via INTRAVENOUS

## 2021-12-06 MED ORDER — MUPIROCIN 2 % EX OINT
1.0000 | TOPICAL_OINTMENT | Freq: Once | CUTANEOUS | Status: AC
Start: 2021-12-06 — End: 2021-12-06
  Administered 2021-12-06: 1 via TOPICAL
  Filled 2021-12-06: qty 22

## 2021-12-06 MED ORDER — LIDOCAINE 2% (20 MG/ML) 5 ML SYRINGE
INTRAMUSCULAR | Status: DC | PRN
Start: 2021-12-06 — End: 2021-12-06
  Administered 2021-12-06: 60 mg via INTRAVENOUS

## 2021-12-06 MED ORDER — CHLORHEXIDINE GLUCONATE 0.12 % MT SOLN
15.0000 mL | Freq: Once | OROMUCOSAL | Status: AC
  Administered 2021-12-06: 15 mL via OROMUCOSAL

## 2021-12-06 MED ORDER — DEXAMETHASONE SODIUM PHOSPHATE 4 MG/ML IJ SOLN: 4.0000 mg | INTRAMUSCULAR | Status: DC

## 2021-12-06 MED ORDER — FENTANYL CITRATE (PF) 250 MCG/5ML IJ SOLN
INTRAMUSCULAR | Status: AC
  Filled 2021-12-06: qty 5

## 2021-12-06 MED ORDER — DEXAMETHASONE SODIUM PHOSPHATE 10 MG/ML IJ SOLN
INTRAMUSCULAR | Status: DC | PRN
Start: 2021-12-06 — End: 2021-12-06
  Administered 2021-12-06: 10 mg via INTRAVENOUS

## 2021-12-06 MED ORDER — ACETAMINOPHEN 500 MG PO TABS
500.0000 mg | ORAL_TABLET | ORAL | Status: AC
  Administered 2021-12-06: 500 mg via ORAL
  Filled 2021-12-06: qty 1

## 2021-12-06 MED ORDER — AMLODIPINE BESYLATE 10 MG PO TABS
10.0000 mg | ORAL_TABLET | Freq: Every day | ORAL | Status: DC
  Administered 2021-12-07 – 2021-12-08 (×2): 10 mg via ORAL
  Filled 2021-12-06 (×2): qty 1

## 2021-12-06 MED ORDER — ONDANSETRON HCL 4 MG/2ML IJ SOLN: 4.0000 mg | Freq: Once | INTRAMUSCULAR | Status: DC | PRN

## 2021-12-06 MED ORDER — KETOROLAC TROMETHAMINE 30 MG/ML IJ SOLN
30.0000 mg | Freq: Once | INTRAMUSCULAR | Status: AC | PRN
  Administered 2021-12-06: 30 mg via INTRAVENOUS

## 2021-12-06 MED ORDER — PHENYLEPHRINE 80 MCG/ML (10ML) SYRINGE FOR IV PUSH (FOR BLOOD PRESSURE SUPPORT)
PREFILLED_SYRINGE | INTRAVENOUS | Status: DC | PRN
Start: 2021-12-06 — End: 2021-12-06
  Administered 2021-12-06 (×2): 80 ug via INTRAVENOUS
  Administered 2021-12-06: 160 ug via INTRAVENOUS

## 2021-12-06 MED ORDER — ONDANSETRON HCL 4 MG/2ML IJ SOLN
INTRAMUSCULAR | Status: DC | PRN
  Administered 2021-12-06: 4 mg via INTRAVENOUS

## 2021-12-06 MED ORDER — OXYCODONE HCL 5 MG PO TABS
5.0000 mg | ORAL_TABLET | ORAL | Status: DC | PRN
  Administered 2021-12-06: 5 mg via ORAL
  Administered 2021-12-06 – 2021-12-07 (×2): 10 mg via ORAL
  Filled 2021-12-06 (×2): qty 2
  Filled 2021-12-06: qty 1

## 2021-12-06 MED ORDER — LACTATED RINGERS IV SOLN: INTRAVENOUS | Status: DC | PRN

## 2021-12-06 SURGICAL SUPPLY — 82 items
APPLICATOR SURGIFLO ENDO (HEMOSTASIS) IMPLANT
BACTOSHIELD CHG 4% 4OZ (MISCELLANEOUS) ×1
BAG LAPAROSCOPIC 12 15 PORT 16 (BASKET) IMPLANT
BAG RETRIEVAL 10 (BASKET) ×2
BAG RETRIEVAL 12/15 (BASKET) ×2
BLADE SURG SZ10 CARB STEEL (BLADE) ×1 IMPLANT
COVER BACK TABLE 60X90IN (DRAPES) ×2 IMPLANT
COVER TIP SHEARS 8 DVNC (MISCELLANEOUS) ×1 IMPLANT
COVER TIP SHEARS 8MM DA VINCI (MISCELLANEOUS) ×1
DERMABOND ADVANCED (GAUZE/BANDAGES/DRESSINGS) ×2
DERMABOND ADVANCED .7 DNX12 (GAUZE/BANDAGES/DRESSINGS) ×1 IMPLANT
DRAPE ARM DVNC X/XI (DISPOSABLE) ×4 IMPLANT
DRAPE COLUMN DVNC XI (DISPOSABLE) ×1 IMPLANT
DRAPE DA VINCI XI ARM (DISPOSABLE) ×4
DRAPE DA VINCI XI COLUMN (DISPOSABLE) ×1
DRAPE SHEET LG 3/4 BI-LAMINATE (DRAPES) ×2 IMPLANT
DRAPE SURG IRRIG POUCH 19X23 (DRAPES) ×2 IMPLANT
DRSG OPSITE POSTOP 4X6 (GAUZE/BANDAGES/DRESSINGS) ×1 IMPLANT
DRSG OPSITE POSTOP 4X8 (GAUZE/BANDAGES/DRESSINGS) IMPLANT
ELECT PENCIL ROCKER SW 15FT (MISCELLANEOUS) ×1 IMPLANT
ELECT REM PT RETURN 15FT ADLT (MISCELLANEOUS) ×2 IMPLANT
GAUZE 4X4 16PLY ~~LOC~~+RFID DBL (SPONGE) ×2 IMPLANT
GLOVE BIO SURGEON STRL SZ 6 (GLOVE) ×9 IMPLANT
GLOVE BIO SURGEON STRL SZ 6.5 (GLOVE) ×4 IMPLANT
GOWN STRL REUS W/ TWL LRG LVL3 (GOWN DISPOSABLE) ×4 IMPLANT
GOWN STRL REUS W/TWL LRG LVL3 (GOWN DISPOSABLE) ×5
HOLDER FOLEY CATH W/STRAP (MISCELLANEOUS) IMPLANT
IRRIG SUCT STRYKERFLOW 2 WTIP (MISCELLANEOUS) ×2
IRRIGATION SUCT STRKRFLW 2 WTP (MISCELLANEOUS) ×1 IMPLANT
KIT PROCEDURE DA VINCI SI (MISCELLANEOUS)
KIT PROCEDURE DVNC SI (MISCELLANEOUS) IMPLANT
KIT TURNOVER KIT A (KITS) IMPLANT
LIGASURE IMPACT 36 18CM CVD LR (INSTRUMENTS) ×1 IMPLANT
MANIPULATOR ADVINCU DEL 3.0 PL (MISCELLANEOUS) IMPLANT
MANIPULATOR ADVINCU DEL 3.5 PL (MISCELLANEOUS) IMPLANT
MANIPULATOR UTERINE 4.5 ZUMI (MISCELLANEOUS) ×1 IMPLANT
NDL HYPO 21X1.5 SAFETY (NEEDLE) ×1 IMPLANT
NDL SPNL 18GX3.5 QUINCKE PK (NEEDLE) IMPLANT
NEEDLE HYPO 21X1.5 SAFETY (NEEDLE) ×4 IMPLANT
NEEDLE SPNL 18GX3.5 QUINCKE PK (NEEDLE) IMPLANT
OBTURATOR OPTICAL STANDARD 8MM (TROCAR) ×1
OBTURATOR OPTICAL STND 8 DVNC (TROCAR) ×1
OBTURATOR OPTICALSTD 8 DVNC (TROCAR) ×1 IMPLANT
PACK ROBOT GYN CUSTOM WL (TRAY / TRAY PROCEDURE) ×2 IMPLANT
PAD POSITIONING PINK XL (MISCELLANEOUS) ×2 IMPLANT
PORT ACCESS TROCAR AIRSEAL 12 (TROCAR) ×1 IMPLANT
PORT ACCESS TROCAR AIRSEAL 5M (TROCAR) ×1
RELOAD STAPLE 45 3.5 BLU DVNC (STAPLE) IMPLANT
RELOAD STAPLER 3.5X45 BLU DVNC (STAPLE) ×1 IMPLANT
SCRUB CHG 4% DYNA-HEX 4OZ (MISCELLANEOUS) ×1 IMPLANT
SEAL CANN UNIV 5-8 DVNC XI (MISCELLANEOUS) ×4 IMPLANT
SEAL XI 5MM-8MM UNIVERSAL (MISCELLANEOUS) ×4
SET TRI-LUMEN FLTR TB AIRSEAL (TUBING) ×2 IMPLANT
SPIKE FLUID TRANSFER (MISCELLANEOUS) ×2 IMPLANT
SPONGE T-LAP 18X18 ~~LOC~~+RFID (SPONGE) ×1 IMPLANT
STAPLER 45 DA VINCI SURE FORM (STAPLE) ×1
STAPLER 45 SUREFORM DVNC (STAPLE) IMPLANT
STAPLER CANNULA SEAL DVNC XI (STAPLE) IMPLANT
STAPLER CANNULA SEAL XI (STAPLE) ×1
STAPLER RELOAD 3.5X45 BLU DVNC (STAPLE) ×1
STAPLER RELOAD 3.5X45 BLUE (STAPLE) ×1
SURGIFLO W/THROMBIN 8M KIT (HEMOSTASIS) IMPLANT
SUT MNCRL AB 4-0 PS2 18 (SUTURE) ×1 IMPLANT
SUT PDS AB 1 TP1 96 (SUTURE) ×2 IMPLANT
SUT VIC AB 0 CT1 27 (SUTURE)
SUT VIC AB 0 CT1 27XBRD ANTBC (SUTURE) IMPLANT
SUT VIC AB 2-0 CT1 27 (SUTURE) ×1
SUT VIC AB 2-0 CT1 TAPERPNT 27 (SUTURE) IMPLANT
SUT VIC AB 4-0 PS2 18 (SUTURE) ×4 IMPLANT
SUT VICRYL 4-0 PS2 18IN ABS (SUTURE) ×1 IMPLANT
SYR 10ML LL (SYRINGE) IMPLANT
SYS BAG RETRIEVAL 10MM (BASKET) ×2
SYS WOUND ALEXIS 18CM MED (MISCELLANEOUS) ×2
SYSTEM BAG RETRIEVAL 10MM (BASKET) IMPLANT
SYSTEM WOUND ALEXIS 18CM MED (MISCELLANEOUS) IMPLANT
TOWEL OR NON WOVEN STRL DISP B (DISPOSABLE) IMPLANT
TRAP SPECIMEN MUCUS 40CC (MISCELLANEOUS) ×1 IMPLANT
TRAY FOLEY MTR SLVR 16FR STAT (SET/KITS/TRAYS/PACK) ×2 IMPLANT
TROCAR XCEL NON-BLD 5MMX100MML (ENDOMECHANICALS) IMPLANT
UNDERPAD 30X36 HEAVY ABSORB (UNDERPADS AND DIAPERS) ×4 IMPLANT
WATER STERILE IRR 1000ML POUR (IV SOLUTION) ×2 IMPLANT
YANKAUER SUCT BULB TIP 10FT TU (MISCELLANEOUS) ×2 IMPLANT

## 2021-12-06 NOTE — Anesthesia Procedure Notes (Signed)
Procedure Name: Intubation ?Date/Time: 12/06/2021 8:43 AM ?Performed by: Gean Maidens, CRNA ?Pre-anesthesia Checklist: Patient identified, Emergency Drugs available, Suction available, Patient being monitored and Timeout performed ?Patient Re-evaluated:Patient Re-evaluated prior to induction ?Oxygen Delivery Method: Circle system utilized ?Preoxygenation: Pre-oxygenation with 100% oxygen ?Induction Type: IV induction ?Ventilation: Mask ventilation without difficulty ?Laryngoscope Size: Sabra Heck and 2 ?Grade View: Grade I ?Tube type: Oral ?Tube size: 7.0 mm ?Number of attempts: 2 ?Airway Equipment and Method: Stylet ?Placement Confirmation: positive ETCO2, breath sounds checked- equal and bilateral and ETT inserted through vocal cords under direct vision ?Secured at: 21 cm ?Tube secured with: Tape ?Dental Injury: Teeth and Oropharynx as per pre-operative assessment  ? ? ? ? ?

## 2021-12-06 NOTE — Discharge Instructions (Addendum)
AFTER SURGERY INSTRUCTIONS ?  ?Return to work: 4-6 weeks if applicable ? ?YOU WILL NEED TO PERFORM ONCE DAILY LOVENOX BLOOD THINNER INJECTIONS AFTER SURGERY FOR 2 WEEKS TO PREVENT BLOOD CLOTS. YOU WILL START THIS ON 12/09/2021 AND SHOULD GIVE THE INJECTION AROUND THE SAME TIME EACH DAY IN THE "LOVE HANDLE" REGION ON YOUR ABDOMEN.  ? ?You will have a white honeycomb dressing over your larger incision. This dressing can be removed 5 days after surgery and you do not need to reapply a new dressing. Once you remove the dressing, you will notice that you have the surgical glue (dermabond) on the incision and this will peel off on its own. You can get this dressing wet in the shower the days after surgery prior to removal on the 5th day.  ? ?You can resume your fish oil when you complete the 2 weeks of lovenox injections. ?  ?Activity: ?1. Be up and out of the bed during the day.  Take a nap if needed.  You may walk up steps but be careful and use the hand rail.  Stair climbing will tire you more than you think, you may need to stop part way and rest.  ?  ?2. No lifting or straining for 6 weeks over 10 pounds. No pushing, pulling, straining for 6 weeks. ?  ?3. No driving for around 1 week(s) if you were cleared to drive before surgery.  Do not drive if you are taking narcotic pain medicine and make sure that your reaction time has returned.  ?  ?4. You can shower as soon as the next day after surgery. Shower daily.  Use your regular soap and water (not directly on the incision) and pat your incision(s) dry afterwards; don't rub.  No tub baths or submerging your body in water until cleared by your surgeon. If you have the soap that was given to you by pre-surgical testing that was used before surgery, you do not need to use it afterwards because this can irritate your incisions.  ?  ?5. No sexual activity and nothing in the vagina for 8 weeks since you had a hysterectomy. ?  ?6. You may experience a small amount of clear  drainage from your incisions, which is normal.  If the drainage persists, increases, or changes color please call the office. ?  ?7. Do not use creams, lotions, or ointments such as neosporin on your incisions after surgery until advised by your surgeon because they can cause removal of the dermabond glue on your incisions.   ?  ?8. You may experience vaginal spotting after surgery or around the 6-8 week mark from surgery when the stitches at the top of the vagina begin to dissolve.  The spotting is normal but if you experience heavy bleeding, call our office. ?  ?9. Take Tylenol or ibuprofen IF YOU ARE ABLE TO TAKE THESE MEDICATIONS first for pain if you able to take these medications and only use narcotic pain medication for severe pain not relieved by the Tylenol or Ibuprofen.  Monitor your Tylenol intake to a max of 4,000 mg in a 24 hour period. You can alternate these medications after surgery. ?  ?Diet: ?1. Low sodium Heart Healthy Diet is recommended but you are cleared to resume your normal (before surgery) diet after your procedure. ?  ?2. It is safe to use a laxative, such as Miralax or Colace, if you have difficulty moving your bowels. You have been prescribed Sennakot-S to take at  bedtime every evening after surgery to keep bowel movements regular and to prevent constipation.   ?  ?Wound Care: ?1. Keep clean and dry.  Shower daily. ?  ?Reasons to call the Doctor: ?Fever - Oral temperature greater than 100.4 degrees Fahrenheit ?Foul-smelling vaginal discharge ?Difficulty urinating ?Nausea and vomiting ?Increased pain at the site of the incision that is unrelieved with pain medicine. ?Difficulty breathing with or without chest pain ?New calf pain especially if only on one side ?Sudden, continuing increased vaginal bleeding with or without clots. ?  ?Contacts: ?For questions or concerns you should contact: ?  ?Dr. Jeral Pinch at [phone] ?  ?Joylene John, NP at [phone] ?  ?After Hours: call  [phone] and have the GYN Oncologist paged/contacted (after 5 pm or on the weekends). ?  ?Messages sent via mychart are for non-urgent matters and are not responded to after hours so for urgent needs, please call the after hours number. ?

## 2021-12-06 NOTE — Anesthesia Postprocedure Evaluation (Signed)
Anesthesia Post Note ? ?Patient: Marieta Markov [facility] ? ?Procedure(s) Performed: XI ROBOTIC ASSISTED BILATERAL SALPINGO OOPHORECTOMY, TOTAL HYSTERECTOMY, PERITONEAL BIOPSY, OMENTECTOMY, LAPAROTOMY, APPENDECTOMY, PAP SMEAR (Bilateral: Abdomen) ? ?  ? ?Patient location during evaluation: PACU ?Anesthesia Type: General ?Level of consciousness: awake and alert ?Pain management: pain level controlled ?Vital Signs Assessment: post-procedure vital signs reviewed and stable ?Respiratory status: spontaneous breathing, nonlabored ventilation, respiratory function stable and patient connected to nasal cannula oxygen ?Cardiovascular status: blood pressure returned to baseline and stable ?Postop Assessment: no apparent nausea or vomiting ?Anesthetic complications: no ? ? ?No notable events documented. ? ?Last Vitals:  ?Vitals:  ? 12/06/21 1422 12/06/21 1543  ?BP: (!) 145/88 (!) 152/92  ?Pulse: 74 72  ?Resp: 14   ?Temp: 36.4 ?C (!) 36.3 ?C  ?SpO2: 100% 100%  ?  ?Last Pain:  ?Vitals:  ? 12/06/21 1543  ?TempSrc: Oral  ?PainSc:   ? ? ?  ?  ?  ?  ?  ?  ? ?Barnet Glasgow ? ? ? ? ?

## 2021-12-06 NOTE — Interval H&P Note (Signed)
History and Physical Interval Note: ? ?12/06/2021 ?7:53 AM ? ?Melanie Madden  has presented today for surgery, with the diagnosis of PELVIC MASSES.  The various methods of treatment have been discussed with the patient and family. After consideration of risks, benefits and other options for treatment, the patient has consented to  Procedure(s): ?XI ROBOTIC ASSISTED BILATERAL SALPINGO OOPHORECTOMY,  POSSIBLE TOTAL HYSTERECTOMY, POSSIBLE STAGING, POSSIBLE LAPARTOMY, POSSIBLE PAP SMEAR (Bilateral) as a surgical intervention.  The patient's history has been reviewed, patient examined, no change in status, stable for surgery.  I have reviewed the patient's chart and labs.  Questions were answered to the patient's satisfaction.   ? ? ?[name] ? ? ?

## 2021-12-06 NOTE — Transfer of Care (Signed)
Immediate Anesthesia Transfer of Care Note ? ?Patient: Melanie Madden [facility] ? ?Procedure(s) Performed: XI ROBOTIC ASSISTED BILATERAL SALPINGO OOPHORECTOMY, TOTAL HYSTERECTOMY, PERITONEAL BIOPSY, OMENTECTOMY, LAPAROTOMY, APPENDECTOMY, PAP SMEAR (Bilateral: Abdomen) ? ?Patient Location: PACU ? ?Anesthesia Type:General ? ?Level of Consciousness: awake, alert  and oriented ? ?Airway & Oxygen Therapy: Patient Spontanous Breathing and Patient connected to face mask oxygen ? ?Post-op Assessment: Report given to RN and Post -op Vital signs reviewed and stable ? ?Post vital signs: Reviewed and stable ? ?Last Vitals:  ?Vitals Value Taken Time  ?BP 154/92 12/06/21 1146  ?Temp 36.4 ?C 12/06/21 1145  ?Pulse 79 12/06/21 1149  ?Resp 14 12/06/21 1149  ?SpO2 98 % 12/06/21 1149  ?Vitals shown include unvalidated device data. ? ?Last Pain:  ?Vitals:  ? 12/06/21 0652  ?TempSrc:   ?PainSc: 0-No pain  ?   ? ?  ? ?Complications: No notable events documented. ?

## 2021-12-06 NOTE — Op Note (Signed)
OPERATIVE NOTE ? ?Pre-operative Diagnosis: Bilateral adnexal masses, normal tumor markers ? 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 ?Operation: Tumor debulking with robotic-assisted laparoscopic total hysterectomy with bilateral salpingo-oophorectomy, mini-lap for specimen delivery and upper abdominal palpation, infracolic omentectomy, peritoneal biopsy, appendectomy ? ?Surgeon: Jeral Pinch MD ? ?Assistant Surgeon: Joylene John NP ? ?Anesthesia: GET ? ?Urine Output: 350cc ? ?Operative Findings:  On EUA, mobile bilateral adnexal masses, small uterus. On intra-abdominal entry, normal upper abdominal survey.  Normal-appearing omentum, small and large bowel.  Some adhesions of the omentum to the right abdominal sidewall.  Appendix mildly inflamed appearing, no obvious mass.  Minimal ascites.  Ovaries replaced by bilateral solid-appearing masses, 10 cm on the left and 5 on the right.  Normal-appearing bilateral fallopian tubes.  Peritoneal implant along the anterior cul-de-sac, resected.  On frozen section, this looked at least atypical.  On frozen section of the ovaries, carcinoma appreciated with signet ring features, suspected to be metastatic disease from a GI source over primary ovary.  No gross adenopathy.  No intra-abdominal or pelvic evidence of disease at the completion of surgery. ? ?Estimated Blood Loss:  100cc     ? ?Total IV Fluids: see I&O flowsheet ?        ?Specimens: uterus, cervix, bilateral tubes and ovaries, peritoneal biopsy, omentum, appendix, pelvic washings ?        ?Complications:  None; patient tolerated the procedure well. ?        ?Disposition: PACU - hemodynamically stable. ? ?Procedure Details  ?The patient was seen in the Holding Room. The risks, benefits, complications, treatment options, and expected outcomes were discussed with the patient.  The patient concurred with the proposed plan, giving informed  consent.  The site of surgery properly noted/marked. The patient was identified as Melanie Madden and the procedure verified as a Robotic-assisted bilateral salpingo oophorectomy with the other indicated procedures. ? ?After induction of anesthesia, the patient was draped and prepped in the usual sterile manner. Patient was placed in supine position after anesthesia and draped and prepped in the usual sterile manner as follows: Her arms were tucked to her side with all appropriate precautions.  The was secured to the bed with tape across her chest.  The patient was placed in the semi-lithotomy position in [place].  The perineum and vagina were prepped with CholoraPrep. The patient was draped after the CholoraPrep had been allowed to dry for 3 minutes.  A Time Out was held and the above information confirmed. ? ?The urethra was prepped with ChloraPrep. Foley catheter was placed.  A sterile speculum was placed in the vagina.  The cervix was grasped with a single-tooth tenaculum. The cervix was dilated with Kennon Rounds dilators.  The ZUMI uterine manipulator with a small colpotomizer ring was placed without difficulty.  A pneum occluder balloon was placed over the manipulator.  OG tube placement was confirmed and to suction.  ? ?Next, a 10 mm skin incision was made 1 cm below the subcostal margin in the midclavicular line.  The 5 mm Optiview port and scope was used for direct entry.  Opening pressure was under 10 mm CO2.  The abdomen was insufflated and the findings were noted as above.   At this point and all points during the procedure, the patient's intra-abdominal pressure did not exceed 15 mmHg. Next, an 8 mm skin incision was made superior to the umbilicus and a right and left port were placed  about 8 cm lateral to the robot port on the right and left side.  A fourth arm was placed on the right.  The 5 mm assist trocar was exchanged for a 10-12 mm port. All ports were placed under direct visualization.  The patient  was placed in steep Trendelenburg.  Bowel was folded away into the upper abdomen.  The robot was docked in the normal manner. ? ?Pelvic washings were collected.  Given peritoneal lesion noted, this was biopsied and sent for frozen section. ? ?The right and left peritoneum were opened parallel to the IP ligament to open the retroperitoneal spaces bilaterally. The round ligaments were transected. The ureter was noted to be on the medial leaf of the broad ligament.  The peritoneum above the ureter was incised and stretched and the infundibulopelvic ligament was skeletonized, cauterized and cut.  Bilateral adnexa were placed in Endo Catch bags. ? ?At this point all of the robotic instruments were removed and the robot was undocked.  With the abdomen still insufflated and the patient level, the supraumbilical trocar was removed and this incision extended to a total length of approximately 8 cm.  This was carried down to and through the fascia with monopolar electrocautery.  Peritoneum was then opened with monopolar electrocautery under direct visualization.  Bilateral adnexa were then removed.  The larger required contained morcellation after an Alexis retractor was placed in the incision and the bag brought out through the incision.  Both ovaries were sent for frozen section. ? ?Given concern for at least borderline tumor, decision made to proceed with omentectomy and hysterectomy.  The omentum and transverse colon were delivered through the incision and a combination of monopolar electrocautery and bipolar electrocautery were used to perform an infracolic omentectomy. The laparoscopic System was placed on the Alexis retractor and the abdomen reinsufflated.  Patient was placed in Trendelenburg.  The robot was redocked and instruments were placed under direct visualization. ? ?The posterior peritoneum was taken down to the level of the KOH ring.  The anterior peritoneum was also taken down.  The bladder flap was created  to the level of the KOH ring.  The uterine artery on the right side was skeletonized, cauterized and cut in the normal manner.  A similar procedure was performed on the left.  The colpotomy was made and the uterus, cervix were amputated and delivered through the vagina.  Pedicles were inspected and excellent hemostasis was achieved.   ? ?At this point frozen section from bilateral ovaries returned.  ? ?The colpotomy at the vaginal cuff was closed with Vicryl on a CT1 needle in a running manner.  Irrigation was used and excellent hemostasis was achieved.   ? ?Given concern for likely GI source, decision was made to remove the appendix.  The appendix was grasped and the parous appendiceal mesentery was dissected down to the appendiceal base.  The appendiceal artery was isolated, cauterized, and transected.  Periappendiceal fat was dissected free from the appendix down to its base.  Robotic 87m stapler was then used to staple and transect the appendix at its base. The appendix was placed in an Endocatch bag and removed. The peri-appendiceal area was irrigated and hemostasis assured.  ? ?At this point in the procedure was completed.  Robotic instruments were removed under direct visulaization.  The robot was undocked.  ? ?The incision was then closed with running #1 looped PDS tied in the midline. The subcutaneous tissue was irrigated and hemostasis achieved. Exparel was injected for  local anesthesia. The subcutaneous tissue was closed with 2-0 Vicyrl in running fashion.  ? ?The fascia at the 10-12 mm port was closed with 0 Vicryl on a UR-5 needle.  The subcuticular tissue of all incisions was closed with 4-0 Vicryl and the skin was closed with 4-0 Monocryl in a subcuticular manner.  Dermabond was applied.   ? ?The vagina was swabbed with  minimal bleeding noted.  Foley catheter was removed.  All sponge, lap and needle counts were correct x  3.  ? ?The patient was transferred to the recovery room in stable  condition. ? ?Jeral Pinch, MD ? ?

## 2021-12-07 ENCOUNTER — Encounter (HOSPITAL_COMMUNITY): Payer: Self-pay | Admitting: Gynecologic Oncology

## 2021-12-07 DIAGNOSIS — C181 Malignant neoplasm of appendix: Secondary | ICD-10-CM | POA: Diagnosis not present

## 2021-12-07 LAB — CBC
HCT: 42 % (ref 36.0–46.0)
Hemoglobin: 14.4 g/dL (ref 12.0–15.0)
MCH: 31.5 pg (ref 26.0–34.0)
MCHC: 34.3 g/dL (ref 30.0–36.0)
MCV: 91.9 fL (ref 80.0–100.0)
Platelets: 200 10*3/uL (ref 150–400)
RBC: 4.57 MIL/uL (ref 3.87–5.11)
RDW: 13.4 % (ref 11.5–15.5)
WBC: 10.1 10*3/uL (ref 4.0–10.5)
nRBC: 0 % (ref 0.0–0.2)

## 2021-12-07 LAB — BASIC METABOLIC PANEL
Anion gap: 8 (ref 5–15)
BUN: 12 mg/dL (ref 6–20)
CO2: 25 mmol/L (ref 22–32)
Calcium: 9.1 mg/dL (ref 8.9–10.3)
Chloride: 103 mmol/L (ref 98–111)
Creatinine, Ser: 0.9 mg/dL (ref 0.44–1.00)
GFR, Estimated: >60 mL/min (ref >60)
Glucose, Bld: 153 mg/dL — ABNORMAL HIGH (ref 70–99)
Potassium: 3.4 mmol/L — ABNORMAL LOW (ref 3.5–5.1)
Sodium: 136 mmol/L (ref 135–145)

## 2021-12-07 MED ORDER — SIMETHICONE 80 MG PO CHEW
80.0000 mg | CHEWABLE_TABLET | Freq: Four times a day (QID) | ORAL | Status: DC
  Administered 2021-12-07 – 2021-12-08 (×5): 80 mg via ORAL
  Filled 2021-12-07 (×5): qty 1

## 2021-12-07 MED ORDER — ENOXAPARIN SODIUM 40 MG/0.4ML IJ SOSY: 40.0000 mg | PREFILLED_SYRINGE | INTRAMUSCULAR | 0 refills | Status: DC

## 2021-12-07 NOTE — Progress Notes (Signed)
1 Day Post-Op Procedure(s) (LRB): ?XI ROBOTIC ASSISTED BILATERAL SALPINGO OOPHORECTOMY, TOTAL HYSTERECTOMY, PERITONEAL BIOPSY, OMENTECTOMY, LAPAROTOMY, APPENDECTOMY, PAP SMEAR (Bilateral) ? ?Subjective: ?Patient reports incisional pain. States pain medication is assisting with pain relief. Denies nausea or emesis. Tolerating solid food this am. No flatus or BM reported. [place] for urinating. No other needs or concerns voiced.   ? ?Objective: ?Vital signs in last 24 hours: ?Temp:  [97.3 ?F (36.3 ?C)-98.6 ?F (37 ?C)] 98.3 ?F (36.8 ?C) (05/11 1013) ?Pulse Rate:  [72-94] 89 (05/11 1013) ?Resp:  [14-18] 17 (05/11 1013) ?BP: (129-162)/(83-98) 140/85 (05/11 1013) ?SpO2:  [97 %-100 %] 100 % (05/11 1013) ?  ? ?Intake/Output from previous day: ?05/10 0701 - 05/11 0700 ?In: 2144.2 [P.O.:240; I.V.:1904.2] ?Out: 3205 [Urine:3105; Blood:100] ? ?Physical Examination: ?General: alert, cooperative, no distress, and appears uncomfortable related to abd pain but this improved with repositioning ?Resp: shallow breathing, diminished ?Cardio: regular rate and rhythm, S1, S2 normal, no murmur, click, rub or gallop ?GI: incision: lap sites to the abdomen with dermabond intact without drainage or erythema. Mini lap incision with op site dressing in place with no drainage noted underneath and abdomen soft, active bowel sounds, appropriately tender on palpation ?Extremities: extremities normal, atraumatic, no cyanosis or edema ? ?Labs: ?WBC/Hgb/Hct/Plts:  10.1/14.4/42.0/200 (05/11 0447) BUN/Cr/glu/ALT/AST/amyl/lip:  12/0.90/--/--/--/--/-- (05/11 0447) ? ?Assessment: ?56 y.o. s/p Procedure(s): ?XI ROBOTIC ASSISTED BILATERAL SALPINGO OOPHORECTOMY, TOTAL HYSTERECTOMY, PERITONEAL BIOPSY, OMENTECTOMY, LAPAROTOMY, APPENDECTOMY, PAP SMEAR: stable ?Pain:  Pain is well-controlled on PRN medications. ? ?Heme: Hgb 14.4 and Hct 42 this am. Appropriate compared with preop values and surgical losses. ? ?CV: BP and HR stable. Continue to monitor with  ordered vital signs. ? ?GI:  Tolerating po: Yes. Antiemetics ordered if needed. ? ?GU: Adequate output reported overnight. Creatinine 0.90 this am.   ? ?FEN: No critical values this am. K+ slightly low at 3.4 ? ?Prophylaxis:  SCDs and lovenox ordered. Will need to be on lovenox at home for the next 2 weeks for DVT prop.  ? ?Plan: ?Saline lock IV ?Simethicone ordered for gas pains ?Diet as tolerated ?Lovenox teaching kit ?Encouraged IS and increasing mobility with assist ?When meeting milestones, plan for possible discharge later today or in the am ?Continue plan of care per Dr. Berline Lopes ? ? LOS: 0 days  ? ? ?Rashel Okeefe D Phillip Sandler ?12/07/2021, 10:31 AM ? ? ? ?   ?

## 2021-12-07 NOTE — Progress Notes (Signed)
?  Transition of Care (TOC) Screening Note ? ? ?Patient Details  ?Name: Melanie Madden [facility] ?Date of Birth: [birth year] ? ? ?Transition of Care (TOC) CM/SW Contact:    ?Ramel Tobon, LCSW ?Phone Number: ?12/07/2021, 12:44 PM ? ? ? ?Transition of Care Department [facility]) has reviewed patient and no TOC needs have been identified at this time. We will continue to monitor patient advancement through interdisciplinary progression rounds. If new patient transition needs arise, please place a TOC consult. ? ? ?

## 2021-12-08 DIAGNOSIS — C181 Malignant neoplasm of appendix: Secondary | ICD-10-CM | POA: Diagnosis not present

## 2021-12-08 LAB — BASIC METABOLIC PANEL
Anion gap: 8 (ref 5–15)
BUN: 13 mg/dL (ref 6–20)
CO2: 26 mmol/L (ref 22–32)
Calcium: 9.2 mg/dL (ref 8.9–10.3)
Chloride: 103 mmol/L (ref 98–111)
Creatinine, Ser: 0.71 mg/dL (ref 0.44–1.00)
GFR, Estimated: >60 mL/min (ref >60)
Glucose, Bld: 112 mg/dL — ABNORMAL HIGH (ref 70–99)
Potassium: 3.4 mmol/L — ABNORMAL LOW (ref 3.5–5.1)
Sodium: 137 mmol/L (ref 135–145)

## 2021-12-08 NOTE — Plan of Care (Signed)

## 2021-12-08 NOTE — Final Progress Note (Signed)
DC instructions were given to patient and husband. All questions were answered. Patient was given lovenox shot prior to DC. Husband stated he has no issues giving the shots at home. Patient was taken to the man entrance in her personal wheelchair.  ?

## 2021-12-08 NOTE — Discharge Summary (Signed)
?Physician Discharge Summary  ?Patient ID: ?Melanie Madden ?MRN: [id number] ?DOB/AGE: 10/11/1965 56 y.o. ? ?Admit date: 12/06/2021 ?Discharge date: 12/08/2021 ? ?Admission Diagnoses: Ovarian mass ? ?Discharge Diagnoses:  ?Principal Problem: ?  Ovarian mass ?Active Problems: ?  Pelvic mass in female ? ? ?Discharged Condition:  The patient is in good condition and stable for discharge.   ? ?Hospital Course: On 12/06/2021, the patient underwent the following: Procedure(s): XI ROBOTIC ASSISTED BILATERAL SALPINGO OOPHORECTOMY, TOTAL HYSTERECTOMY, PERITONEAL BIOPSY, OMENTECTOMY, LAPAROTOMY, APPENDECTOMY, PAP SMEAR. The postoperative course was uneventful.  She was discharged to home on postoperative day 2 tolerating a regular diet, voiding, pain controlled with oral medications, instructed on lovenox injections.  ? ?Consults: None ? ?Significant Diagnostic Studies: labs ? ?Treatments: surgery: see above ? ?Discharge Exam: ?Blood pressure (!) 143/84, pulse 80, temperature 98 ?F (36.7 ?C), resp. rate 18, height 5' 7.5" (1.715 m), weight 148 lb (67.1 kg), SpO2 97 %. ?General appearance: alert, cooperative, and no distress ?Resp: clear to auscultation bilaterally ?Cardio: regular rate and rhythm, S1, S2 normal, no murmur, click, rub or gallop ?GI: active bowel sounds, abdomen soft, appropriate tenderness ?Extremities: extremities normal, atraumatic, no cyanosis or edema ?Incision/Wound: lap sites to the abdomen with dermabond intact without drainage or erythema. Mini lap incision with op site dressing in place with no drainage noted underneath. ? ?Disposition:  ?Discharge disposition: 01-Home or Self Care ? ? ? ? ? ? ?Discharge Instructions   ? ? Call MD for:  difficulty breathing, headache or visual disturbances   Complete by: As directed ?  ? Call MD for:  extreme fatigue   Complete by: As directed ?  ? Call MD for:  hives   Complete by: As directed ?  ? Call MD for:  persistant dizziness or light-headedness    Complete by: As directed ?  ? Call MD for:  persistant nausea and vomiting   Complete by: As directed ?  ? Call MD for:  redness, tenderness, or signs of infection (pain, swelling, redness, odor or green/yellow discharge around incision site)   Complete by: As directed ?  ? Call MD for:  severe uncontrolled pain   Complete by: As directed ?  ? Call MD for:  temperature >100.4   Complete by: As directed ?  ? Diet - low sodium heart healthy   Complete by: As directed ?  ? Discharge wound care:   Complete by: As directed ?  ? You will have a white honeycomb dressing over your larger incision. This dressing can be removed 5 days after surgery and you do not need to reapply a new dressing. Once you remove the dressing, you will notice that you have the surgical glue (dermabond) on the incision and this will peel off on its own. You can get this dressing wet in the shower the days after surgery prior to removal on the 5th day.  ? Driving Restrictions   Complete by: As directed ?  ? No driving for around 1 week(s) if you were cleared to drive before surgery.  Do not take narcotics and drive. You need to make sure your reaction time has returned.  ? Increase activity slowly   Complete by: As directed ?  ? Lifting restrictions   Complete by: As directed ?  ? No lifting greater than 10 lbs, pushing, pulling, straining for 6 weeks.  ? Sexual Activity Restrictions   Complete by: As directed ?  ? No sexual activity, nothing in the  vagina, for 8 weeks.  ? ?  ? ?Allergies as of 12/08/2021   ? ?   Reactions  ? Amoxicillin Nausea Only  ? Shellfish Allergy Diarrhea, Nausea And Vomiting  ? Mussels   ? ?  ? ?  ?Medication List  ?  ? ?STOP taking these medications   ? ?docusate sodium 100 MG capsule ?Commonly known as: COLACE ?  ?Fish Oil 1200 MG Cpdr ?  ? ?  ? ?TAKE these medications   ? ?amLODipine 10 MG tablet ?Commonly known as: NORVASC ?Take 10 mg by mouth daily. ?  ?atorvastatin 20 MG tablet ?Commonly known as: LIPITOR ?Take 1  tablet (20 mg total) by mouth daily at 6 PM. ?What changed: when to take this ?  ?B-complex with vitamin C tablet ?Take 1 tablet by mouth daily. ?  ?CALCIUM PO ?Take 1 tablet by mouth daily. ?  ?enoxaparin 40 MG/0.4ML injection ?Commonly known as: LOVENOX ?Inject 0.4 mLs (40 mg total) into the skin daily for 12 days. ?Start taking on: Dec 09, 2021 ?  ?ferrous sulfate 325 (65 FE) MG EC tablet ?Take 325 mg by mouth daily with breakfast. ?  ?magnesium 30 MG tablet ?Take 30 mg by mouth daily. ?  ?senna-docusate 8.6-50 MG tablet ?Commonly known as: Senokot-S ?Take 2 tablets by mouth at bedtime. For AFTER surgery, do not take if having diarrhea, do not take with colace ?  ?traMADol 50 MG tablet ?Commonly known as: ULTRAM ?Take 1 tablet (50 mg total) by mouth every 6 (six) hours as needed for severe pain. For AFTER surgery only, do not take and drive ?  ?Vitamin D 50 MCG (2000 UT) Caps ?Take 2,000 Units by mouth daily. ?  ?zinc gluconate 50 MG tablet ?Take 50 mg by mouth daily. ?  ? ?  ? ?  ?  ? ? ?  ?Discharge Care Instructions  ?(From admission, onward)  ?  ? ? ?  ? ?  Start     Ordered  ? 12/08/21 0000  Discharge wound care:       ?Comments: You will have a white honeycomb dressing over your larger incision. This dressing can be removed 5 days after surgery and you do not need to reapply a new dressing. Once you remove the dressing, you will notice that you have the surgical glue (dermabond) on the incision and this will peel off on its own. You can get this dressing wet in the shower the days after surgery prior to removal on the 5th day.  ? 12/08/21 0932  ? ?  ?  ? ?  ? ? Follow-up Information   ? ? [name], MD Follow up on 12/19/2021.   ?Specialty: Gynecologic Oncology ?Why: at 4:40pm will be a PHONE visit with Dr. Berline Lopes to discuss pathology and check in. IN PERSON visit will be on 01/04/22 at 2:30pm at the [facility] ?Contact information: ?[address] ?[place] [address] ?[phone] ? ? ?  ?   ? ?  ?  ? ?  ? ? ?Greater than thirty minutes were spend for face to face discharge instructions and discharge orders/summary in EPIC.  ? ?Signed: ?Odyn Turko D Trishelle Devora ?12/08/2021, 9:35 AM ? ? ? ?  ?

## 2021-12-11 ENCOUNTER — Telehealth: Payer: Self-pay | Admitting: *Deleted

## 2021-12-11 LAB — CYTOLOGY - NON PAP

## 2021-12-11 NOTE — Telephone Encounter (Signed)
Spoke with Mr.Rock this morning. He states Deborh is eating, drinking and urinating well. She has had a little BM yesterday. She is taking senokot as prescribed and encouraged her to drink plenty of water. Also educated him about adding Miralax.  She denies fever or chills. Incisions are dry and intact. He states her pain is manageable with pain medication and it is getting less each day. Her pain is controlled with Advil and Tramadol.   ? ?Instructed to call office with any fever, chills, purulent drainage, uncontrolled pain or any other questions or concerns. Patient verbalizes understanding.  ? ?Pt aware of post op appointments as well as the office number [phone] and after hours number [phone] to call if she has any questions or concerns  ?

## 2021-12-13 ENCOUNTER — Telehealth: Payer: Self-pay | Admitting: Oncology

## 2021-12-13 ENCOUNTER — Telehealth: Payer: Self-pay | Admitting: *Deleted

## 2021-12-13 ENCOUNTER — Encounter: Payer: Self-pay | Admitting: Oncology

## 2021-12-13 DIAGNOSIS — C181 Malignant neoplasm of appendix: Secondary | ICD-10-CM

## 2021-12-13 LAB — CYTOLOGY - PAP: Diagnosis: REACTIVE

## 2021-12-13 LAB — SURGICAL PATHOLOGY

## 2021-12-13 NOTE — Progress Notes (Signed)
Called Melanie Madden and advised of appointment with Dr. Benay Spice on 12/22/21 at 1:40 at [facility].  Gave him the address and advised that [name] can bring 1 support person and must wear a mask.  Also discussed that Tylene Fantasia, Nurse Navigator will call a few days before the appointment.  He verbalized understanding and agreement. ?

## 2021-12-13 NOTE — Telephone Encounter (Signed)
[name] called in today and left a voice message wanting to know the results of the pathology from surgery. Joylene John, NP notified.  ?

## 2021-12-13 NOTE — Telephone Encounter (Addendum)
Called Melanie Madden and advised him of the pathology results from surgery.  Discussed that the primary site of the cancer was from the appendix so Keelee will not need further GI workup. Advised that we are working on getting her an appointment with Dr. Benay Spice and will call him back with an appointment.  He verbalized understanding and agreement. ?

## 2021-12-19 ENCOUNTER — Inpatient Hospital Stay: Payer: Managed Care, Other (non HMO) | Admitting: Gynecologic Oncology

## 2021-12-19 ENCOUNTER — Telehealth: Payer: Self-pay | Admitting: *Deleted

## 2021-12-19 ENCOUNTER — Encounter: Payer: Self-pay | Admitting: Gynecologic Oncology

## 2021-12-19 NOTE — Telephone Encounter (Signed)
Returned the patient's call and her stated "My wife will not be able to do the phone or video visit. I will be at work and not home with her for that appt. She can't talk due to the stroke.  Plus can we change the appt to in person and sooner the 6/8; she will not let me look at her wounds."  Explained the the providers are in the OR today and the office would call him back.

## 2021-12-20 ENCOUNTER — Other Ambulatory Visit: Payer: Self-pay

## 2021-12-20 NOTE — Progress Notes (Signed)
The proposed treatment discussed in conference is for discussion purpose only and is not a binding recommendation.  The patients have not been physically examined, or presented with their treatment options.  Therefore, final treatment plans cannot be decided.  

## 2021-12-21 ENCOUNTER — Other Ambulatory Visit: Payer: Self-pay

## 2021-12-21 ENCOUNTER — Inpatient Hospital Stay: Payer: Managed Care, Other (non HMO) | Attending: Gynecologic Oncology | Admitting: Gynecologic Oncology

## 2021-12-21 ENCOUNTER — Telehealth: Payer: Self-pay

## 2021-12-21 VITALS — BP 125/78 | HR 97 | Temp 98.9°F | Resp 16 | Ht 67.0 in | Wt 139.0 lb

## 2021-12-21 DIAGNOSIS — C181 Malignant neoplasm of appendix: Secondary | ICD-10-CM

## 2021-12-21 DIAGNOSIS — C7963 Secondary malignant neoplasm of bilateral ovaries: Secondary | ICD-10-CM

## 2021-12-21 NOTE — Patient Instructions (Signed)
You are healing well after surgery. Your incisions are healing nicely. Plan to follow up with Dr. Benay Spice tomorrow at [facility] to discuss recommendations moving forward.

## 2021-12-21 NOTE — Telephone Encounter (Signed)
Pt's husband notified that Melanie Madden's appt went well today and she is healing well.Her incisions look great. Proceed with plan to meet with Dr. Benay Spice tomorrow to discuss recommendations moving forward in more detail. He voiced an understanding and was thankful for the call.

## 2021-12-22 ENCOUNTER — Encounter: Payer: Self-pay | Admitting: *Deleted

## 2021-12-22 ENCOUNTER — Inpatient Hospital Stay: Payer: Managed Care, Other (non HMO) | Admitting: Oncology

## 2021-12-22 VITALS — BP 137/84 | HR 93 | Temp 98.4°F | Resp 18 | Ht 67.0 in | Wt 142.2 lb

## 2021-12-22 DIAGNOSIS — C181 Malignant neoplasm of appendix: Secondary | ICD-10-CM | POA: Diagnosis not present

## 2021-12-22 NOTE — Progress Notes (Signed)
[facility] New Patient Consult   Requesting MD: Dorothyann Gibbs, [place],  [place] [id number]   Anastasia Tompson 56 y.o.  11-Jan-1966    Reason for Consult: Appendix carcinoma   HPI: Ms.  Charlton Amor has a history of kidney stones.  She is followed by urology.  A CT renal stone study 10/05/2021 showed bilateral nephrolithiasis and uterine fibroids.  There is a 4.9 cm right adnexal mass and an completely characterized left adrenal cystic lesion.  MRI of the abdomen pelvis on 10/26/2021 revealed small fibroids, new large bilateral adnexal masses.  The left mass measured 8 cm with the right mass measuring 5 cm.  No adenopathy, omental disease, or evidence of metastatic disease.  Stable 4 cm left adrenal gland lesion consistent with a benign adenoma.  She was referred to Dr. Berline Lopes.  The CEA and CA125 returned normal.  She was taken to the operating room on 12/06/2021 for a robotic assisted laparoscopic hysterectomy, bilateral salpingo oophorectomy, infracolic omentectomy, peritoneal biopsy, and appendectomy.  The omentum and bowel appeared normal.  The appendix appeared mildly inflamed with no obvious mass.  Minimal ascites.  The ovaries were replaced by bilateral solid-appearing masses.  Normal-appearing fallopian tubes.  The peritoneal implant was resected at the anterior cul-de-sac.  Frozen section from the ovary returned with carcinoma having signet ring features suspected to be metastatic disease from a GI primary.  There was no evidence of disease at the completion of surgery.  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  Both ovaries contain metastatic poorly differentiated carcinoma with signet ring features consistent with primary goblet cell adenocarcinoma of the appendix.  The omentum resection contained metastatic appendix carcinoma.  The anterior cul-de-sac  biopsy was "suspicious "for malignancy.   She is here today with her husband and a caretaker.  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  Urosepsis   03/18/2021   .  G0, P0   .  History of migraine headaches  Past Surgical History:  Procedure Laterality Date   ANTERIOR CRUCIATE LIGAMENT REPAIR Left    BUNIONECTOMY Right    CYSTOSCOPY/URETEROSCOPY/HOLMIUM LASER/STENT PLACEMENT Right 02/08/2021   Procedure: CYSTOSCOPY/RETROGRADE/URETEROSCOPY/HOLMIUM LASER/STENT PLACEMENT, NEPHROSTOMY TUBE REMOVAL;  Surgeon: Ceasar Mons, MD;  Location: [facility];  Service: Urology;  Laterality: Right;   IR NEPHROSTOMY PLACEMENT RIGHT  12/09/2020   LASIK     LIPOSUCTION     LITHOTRIPSY     MENISCUS REPAIR Left    NASAL SEPTUM SURGERY     ROBOTIC ASSISTED TOTAL HYSTERECTOMY WITH BILATERAL SALPINGO OOPHERECTOMY Bilateral 12/06/2021   Procedure: XI ROBOTIC ASSISTED BILATERAL SALPINGO OOPHORECTOMY, TOTAL HYSTERECTOMY, PERITONEAL BIOPSY, OMENTECTOMY, LAPAROTOMY, APPENDECTOMY, PAP SMEAR;  Surgeon: [name], MD;  Location: [facility];  Service: Gynecology;  Laterality: Bilateral;   SHOULDER SURGERY     VARICOSE VEIN SURGERY Left     Medications: Reviewed  Allergies:  Allergies  Allergen Reactions   Amoxicillin Nausea Only   Shellfish Allergy Diarrhea and Nausea And Vomiting    Mussels     Family history: No family history  of cancer  Social History:   She lives with her husband in [place].  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  She does not use cigarettes.  She drinks wine occasionally.  No transfusion history.  No risk factor for HIV or hepatitis.  She has not received COVID 19 or  influenza vaccines  ROS:   Positives include: Rash over the neck for the past 5 months, persistent right hemiplegia  A complete ROS was otherwise negative.  Physical Exam:  Blood pressure 137/84, pulse 93, temperature 98.4 F (36.9 C), temperature source Oral, resp. rate 18, height '5\' 7"'$  (1.702 m), weight 142 lb 3.2 oz (64.5 kg), SpO2 99 %.  HEENT: Neck without mass Lungs: Clear bilaterally Cardiac: Regular rate and rhythm Abdomen: No hepatosplenomegaly, healed surgical incisions, no apparent ascites  Vascular: No leg edema Lymph nodes: No cervical, supraclavicular, axillary, or inguinal nodes Neurologic: Alert, follows commands, partial aphasia, right hemiplegia Skin: Pustular erythematous rash over the neck and face Musculoskeletal: No spine tenderness   LAB:  CBC  Lab Results  Component Value Date   WBC 10.1 12/07/2021   HGB 14.4 12/07/2021   HCT 42.0 12/07/2021   MCV 91.9 12/07/2021   PLT 200 12/07/2021   NEUTROABS 3.3 01/08/2021        CMP  Lab Results  Component Value Date   NA 137 12/08/2021   K 3.4 (L) 12/08/2021   CL 103 12/08/2021   CO2 26 12/08/2021   GLUCOSE 112 (H) 12/08/2021   BUN 13 12/08/2021   CREATININE 0.71 12/08/2021   CALCIUM 9.2 12/08/2021   PROT 5.7 (L) 12/06/2021   ALBUMIN 3.1 (L) 12/06/2021   AST 47 (H) 12/06/2021   ALT 17 12/06/2021   ALKPHOS 55 12/06/2021   BILITOT 1.4 (H) 12/06/2021   GFRNONAA >60 12/08/2021   GFRAA >60 12/19/2019     Lab Results  Component Value Date   CEA1 2.90 11/06/2021    Imaging: As per HPI, MRI images reviewed at the GI tumor conference 12/20/2021    Assessment/Plan:   Goblet cell adenocarcinoma of the appendix, stage IV, (pT4a,pNx,pM1c) TAH/BSO, omentum resection, cul-de-sac biopsy-metastatic poorly differentiated carcinoma with signet ring cells involving bilateral ovaries, the omentum, anterior cul-de-sac biopsy suspicious for malignancy.  Resection margins negative. 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   For lithiasis 4.   Admission 12/09/2020 with urosepsis secondary to obstructing right renal stone, status post percutaneous nephrostomy placement Cystoscopy with laser lithotripsy and right JJ stent placement 02/08/2021   Disposition:   Ms. Gaspar Skeeters -[name] has been diagnosed with metastatic adenocarcinoma of the appendix.  She underwent an appendectomy and resection of metastatic disease involving the ovaries and omentum.  There was no evidence of remaining disease following completion of the cytoreductive surgery.  I discussed the diagnosis, prognosis, and treatment options with Ms. Dasilva-Melo and her husband.  She understands the cytoreductive surgery was most likely not curative.  We discussed the likelihood of developing progressive disease over the next few years.  We discussed the indication for systemic therapy.  I do not recommend HIPEC given the lack of remaining disease and the adenocarcinoma histology.  She will be scheduled for a staging PET scan in 3-4 weeks.  She will return for an office visit after the PET scan.  We discussed systemic therapy regimens for treating appendix carcinoma including single agent capecitabine and FOLFOX.  We will decide on continued observation versus a course of systemic therapy when she returns next month.  Betsy Coder, MD  12/22/2021, 4:15 PM

## 2021-12-22 NOTE — Progress Notes (Signed)
PATIENT NAVIGATOR PROGRESS NOTE  Name: Melanie Madden Date: 12/22/2021 MRN: [id number]  DOB: [birth year]   Reason for visit:  Initial visit with Dr Benay Spice  Comments:  Met with [name] and Mr Joylene John and caregiver during visit with Dr Benay Spice. Will schedule for patient to have PET scan on June 21 Return to clinic to see Dr Benay Spice after PET scan to discuss Capecitabine therapy versus Surveillance    Time spent counseling/coordinating care: > 60 minutes

## 2021-12-22 NOTE — Progress Notes (Signed)
GYN Oncology Post-op Follow Up  Melanie Madden is a 57 year old female s/p ROBOTIC ASSISTED BILATERAL SALPINGO OOPHORECTOMY, TOTAL HYSTERECTOMY, PERITONEAL BIOPSY, OMENTECTOMY, LAPAROTOMY, APPENDECTOMY, PAP SMEAR on 12/06/21 with Dr. Jeral Pinch. Final pathology revealed: A. CUL DE SAC, ANTERIOR, BIOPSY:  - Suspicious for malignancy   B. OVARY AND FALLOPIAN TUBE, LEFT, SALPINGO OOPHORECTOMY:  - Metastatic poorly differentiated carcinoma with signet ring cells,  consistent with primary goblet cell adenocarcinoma of the appendix   C. OVARY AND FALLOPIAN TUBE, RIGHT, SALPINGO OOPHORECTOMY:  - Metastatic poorly differentiated carcinoma with signet ring cells,  consistent with primary goblet cell adenocarcinoma of the appendix   D. OMENTUM, RESECTION:  - Metastatic poorly differentiated carcinoma with signet ring cells,  consistent with primary goblet cell adenocarcinoma of the appendix   E. UTERUS AND CERVIX, HYSTERECTOMY:  - Uterus with benign inactive endometrium  - Benign unremarkable cervix  - No evidence of malignancy   F. APPENDIX, APPENDECTOMY:  - Goblet cell Adenocarcinoma of appendix, poorly differentiated  - Tumor invades into the serosal surface  - Proximal resection margin is negative for carcinoma  - See oncology table   She presents today to the office with her CNA for an incisional check, check in. She has been doing well at home. Minimal pain reported and has not had to take any prescription pain meds. Tolerating diet with no nausea or emesis reported. Bowels and bladder functioning without difficulty. No lower extremity swelling or pain reported. No vaginal bleeding reported. No concerns about healing of incisions.   Exam: Alert, oriented, in no acute distress. Lungs clear. Heart regular in rate and rhythm. Abdomen soft with active bowel sounds. Abdominal incisions healing well without drainage or erythema. No lower extremity edema.    Assessment/Plan: 56 year old female with metastatic adenocarcinoma of the appendix s/p tumor debulking with robotic-assisted laparoscopic total hysterectomy with bilateral salpingo-oophorectomy, mini-lap for specimen delivery and upper abdominal palpation, infracolic omentectomy, peritoneal biopsy, appendectomy on 12/06/21. She is doing well post-operatively and her incisions are healing nicely. Dr. Berline Lopes spoke with the patient and caregiver about the path results via speaker phone. She has an appt with Dr. Julieanne Manson tomorrow to further discuss the plan moving forward. Advised to call the office for any needs or concerns.

## 2021-12-26 ENCOUNTER — Other Ambulatory Visit: Payer: Self-pay | Admitting: *Deleted

## 2021-12-26 ENCOUNTER — Inpatient Hospital Stay: Payer: Managed Care, Other (non HMO) | Admitting: Licensed Clinical Social Worker

## 2021-12-26 ENCOUNTER — Encounter: Payer: Self-pay | Admitting: Licensed Clinical Social Worker

## 2021-12-26 DIAGNOSIS — C181 Malignant neoplasm of appendix: Secondary | ICD-10-CM

## 2021-12-26 NOTE — Progress Notes (Signed)
Referral placed for SW evaluation

## 2021-12-26 NOTE — Progress Notes (Signed)
Melanie Madden  Initial Assessment   Melanie Madden is a 56 y.o. year old female contacted caregiver by phone. Clinical Social Madden was referred by nurse navigator for assessment of psychosocial needs.   SDOH (Social Determinants of Health) assessments performed: No   SDOH Screenings   Alcohol Screen: Not on file  Depression (PHQ2-9): Not on file  Financial Resource Strain: Not on file  Food Insecurity: Not on file  Housing: Not on file  Physical Activity: Not on file  Social Connections: Not on file  Stress: Not on file  Tobacco Use: Medium Risk   Smoking Tobacco Use: Former   Smokeless Tobacco Use: Former   Passive Exposure: Not on file  Transportation Needs: Not on file     Distress Screen completed: No     View : No data to display.            Family/Social Information:  Housing Arrangement: patient lives with spouse  Melanie Madden (Spouse) [phone] Family members/support persons in your life? Family and private duty caregiver Transportation concerns: no  Employment: Unemployed , patient has been unable to Madden since 2020 due to stroke.  Income source: Supported by [employer] and [employer] and No income Financial concerns: No Type of concern: None Food access concerns: no Religious or spiritual practice: Not known Services Currently in place:  Academic librarian, Gaffer  Coping/ Adjustment to diagnosis: Patient understands treatment plan and what happens next? yes, patient is unable to speak, spouse is main representative, then caregiver Concerns about diagnosis and/or treatment: I'm not especially worried about anything Patient reported stressors:  N/A - patient unable to verbally express concerns or needs. Hopes and/or priorities: N/A Patient enjoys  N/A Current coping skills/ strengths: Supportive family/friends     SUMMARY: Current SDOH Barriers:  ADL IADL limitations, Social Isolation, Inability to perform ADL's  independently, and Inability to perform IADL's independently  Clinical Social Madden Clinical Goal(s):  No clinical social Madden goals at this time  Interventions: Discussed common feeling and emotions when being diagnosed with cancer, and the importance of support during treatment Informed patient of the support team roles and support services at [facility] Provided [facility] contact information and encouraged patient to call with any questions or concerns Provided patient with information about CSW role in patient care and available resources.  CSW spoke with patient's spouse Mr. Melanie Madden, who stated the patient had not needs at this time.   Follow Up Plan: Patient will contact CSW with any support or resource needs Patient verbalizes understanding of plan: Yes    Alfie Rideaux, LCSW

## 2021-12-26 NOTE — Progress Notes (Signed)
[facility] CSW Progress Note  Clinical Education officer, museum contacted caregiver by phone to discuss needs. No needs at this time    [employer], LCSW

## 2022-01-01 ENCOUNTER — Encounter: Payer: Self-pay | Admitting: Gynecologic Oncology

## 2022-01-01 ENCOUNTER — Encounter (HOSPITAL_COMMUNITY): Payer: Self-pay | Admitting: Oncology

## 2022-01-04 ENCOUNTER — Other Ambulatory Visit: Payer: Self-pay

## 2022-01-04 ENCOUNTER — Inpatient Hospital Stay: Payer: Managed Care, Other (non HMO) | Attending: Gynecologic Oncology | Admitting: Gynecologic Oncology

## 2022-01-04 ENCOUNTER — Encounter: Payer: Self-pay | Admitting: Gynecologic Oncology

## 2022-01-04 VITALS — BP 142/83 | HR 100 | Temp 99.9°F | Resp 16 | Ht 67.0 in | Wt 136.0 lb

## 2022-01-04 DIAGNOSIS — Z7189 Other specified counseling: Secondary | ICD-10-CM

## 2022-01-04 DIAGNOSIS — N9489 Other specified conditions associated with female genital organs and menstrual cycle: Secondary | ICD-10-CM

## 2022-01-04 DIAGNOSIS — Z90722 Acquired absence of ovaries, bilateral: Secondary | ICD-10-CM

## 2022-01-04 DIAGNOSIS — C181 Malignant neoplasm of appendix: Secondary | ICD-10-CM | POA: Insufficient documentation

## 2022-01-04 DIAGNOSIS — Z87891 Personal history of nicotine dependence: Secondary | ICD-10-CM | POA: Insufficient documentation

## 2022-01-04 DIAGNOSIS — Z90710 Acquired absence of both cervix and uterus: Secondary | ICD-10-CM

## 2022-01-04 NOTE — Patient Instructions (Signed)
It was good to see you today.  You are healing well from surgery.  Restrictions including no heavy lifting and pelvic rest are still in place for total of 8 weeks after surgery.

## 2022-01-04 NOTE — Progress Notes (Signed)
Gynecologic Oncology Return Clinic Visit  01/04/22  Reason for Visit: Follow-up after surgery  Treatment History: 12/06/21: Robotic TLH/BSO, mini-lap for specimen delivery and upper abdominal palpation, infracolic omentectomy, peritoneal biopsy, appendectomy. 12/22/21: Established care with Dr. Benay Spice for newly diagnosed stage IV appendiceal adenocarcinoma.   Interval History: Patient presents with her CNA today.  Reports doing well.  Denies any abdominal or pelvic pain.  Denies any vaginal bleeding or discharge.  Reports baseline bowel and bladder function.  Past Medical/Surgical History: Past Medical History:  Diagnosis Date   Allergy    Anxiety    Depression    Expressive aphasia    Hemiparesis ([place])    Right side   High triglycerides    History of kidney stones    Kidney stones    Recurrent sinus infections    Stroke [facility])     Past Surgical History:  Procedure Laterality Date   ANTERIOR CRUCIATE LIGAMENT REPAIR Left    BUNIONECTOMY Right    CYSTOSCOPY/URETEROSCOPY/HOLMIUM LASER/STENT PLACEMENT Right 02/08/2021   Procedure: CYSTOSCOPY/RETROGRADE/URETEROSCOPY/HOLMIUM LASER/STENT PLACEMENT, NEPHROSTOMY TUBE REMOVAL;  Surgeon: Ceasar Mons, MD;  Location: [facility];  Service: Urology;  Laterality: Right;   IR NEPHROSTOMY PLACEMENT RIGHT  12/09/2020   LASIK     LIPOSUCTION     LITHOTRIPSY     MENISCUS REPAIR Left    NASAL SEPTUM SURGERY     ROBOTIC ASSISTED TOTAL HYSTERECTOMY WITH BILATERAL SALPINGO OOPHERECTOMY Bilateral 12/06/2021   Procedure: XI ROBOTIC ASSISTED BILATERAL SALPINGO OOPHORECTOMY, TOTAL HYSTERECTOMY, PERITONEAL BIOPSY, OMENTECTOMY, LAPAROTOMY, APPENDECTOMY, PAP SMEAR;  Surgeon: [name], MD;  Location: [facility];  Service: Gynecology;  Laterality: Bilateral;   SHOULDER SURGERY     VARICOSE VEIN SURGERY Left     Family History  Problem Relation Age of Onset   Alzheimer's disease Mother    Hypertension Mother    Diabetes Mother     Thyroid disease Mother    Deep vein thrombosis Maternal Grandmother    Colon cancer Neg Hx    Colon polyps Neg Hx    Esophageal cancer Neg Hx    Pancreatic cancer Neg Hx    Rectal cancer Neg Hx    Stomach cancer Neg Hx    Breast cancer Neg Hx     Social History   Socioeconomic History   Marital status: Married    Spouse name: Not on file   Number of children: Not on file   Years of education: Not on file   Highest education level: Not on file  Occupational History   Not on file  Tobacco Use   Smoking status: Former   Smokeless tobacco: Former  Scientific laboratory technician Use: Never used  Substance and Sexual Activity   Alcohol use: Not Currently   Drug use: No   Sexual activity: Yes    Partners: Male  Other Topics Concern   Not on file  Social History Narrative   Not on file   Social Determinants of Health   Financial Resource Strain: Not on file  Food Insecurity: Not on file  Transportation Needs: Not on file  Physical Activity: Not on file  Stress: Not on file  Social Connections: Not on file    Current Medications:  Current Outpatient Medications:    amLODipine (NORVASC) 10 MG tablet, Take 10 mg by mouth daily., Disp: , Rfl:    atorvastatin (LIPITOR) 20 MG tablet, Take 1 tablet (20 mg total) by mouth daily at 6 PM. (Patient taking differently: Take 20  mg by mouth daily.), Disp: 30 tablet, Rfl: 1   B Complex-C (B-COMPLEX WITH VITAMIN C) tablet, Take 1 tablet by mouth daily., Disp: , Rfl:    CALCIUM PO, Take 1 tablet by mouth daily., Disp: , Rfl:    Cholecalciferol (VITAMIN D) 50 MCG (2000 UT) CAPS, Take 2,000 Units by mouth daily., Disp: , Rfl:    ferrous sulfate 325 (65 FE) MG EC tablet, Take 325 mg by mouth daily with breakfast., Disp: , Rfl:    magnesium 30 MG tablet, Take 30 mg by mouth daily., Disp: , Rfl:    Omega-3 Fatty Acids (FISH OIL PO), Take by mouth., Disp: , Rfl:    zinc gluconate 50 MG tablet, Take 50 mg by mouth daily., Disp: , Rfl:     METRONIDAZOLE, TOPICAL, 0.75 % LOTN, Apply topically. (Patient not taking: Reported on 01/01/2022), Disp: , Rfl:   Review of Systems: Denies appetite changes, fevers, chills, fatigue, unexplained weight changes. Denies hearing loss, neck lumps or masses, mouth sores, ringing in ears or voice changes. Denies cough or wheezing.  Denies shortness of breath. Denies chest pain or palpitations. Denies leg swelling. Denies abdominal distention, pain, blood in stools, constipation, diarrhea, nausea, vomiting, or early satiety. Denies pain with intercourse, dysuria, frequency, hematuria or incontinence. Denies hot flashes, pelvic pain, vaginal bleeding or vaginal discharge.   Denies joint pain, back pain or muscle pain/cramps. Denies itching, rash, or wounds. Denies dizziness, headaches, numbness or seizures. Denies swollen lymph nodes or glands, denies easy bruising or bleeding. Denies anxiety, depression, confusion, or decreased concentration.  Physical Exam: BP (!) 142/83 (BP Location: Left Arm, Patient Position: Sitting)   Pulse 100   Temp 99.9 F (37.7 C) (Tympanic)   Resp 16   Ht _0  (1.702 m)   Wt 136 lb (61.7 kg)   SpO2 99%   BMI 21.30 kg/m  General: Alert, oriented, no acute distress. HEENT: Normocephalic, atraumatic, sclera anicteric. Chest: Unlabored breathing on room air. Abdomen: soft, nontender.  Normoactive bowel sounds.  No masses or hepatosplenomegaly appreciated.  well-healed incisions. Extremities: Grossly normal range of motion.  Warm, well perfused.  No edema bilaterally. GU: Normal appearing external genitalia without erythema, excoriation, or lesions.  Speculum exam reveals no bleeding or discharge, cuff intact with suture visible.  Bimanual exam reveals cuff intact, no fluctuance or tenderness with palpation.   Laboratory & Radiologic Studies: None new  Assessment & Plan: Melanie Madden is a 56 y.o. woman with Stage IV adenocarcinoma of the appendix  who presents for follow-up.  Patient is overall doing very well from a postoperative standpoint.  Meeting all his milestones.  Discussed continued expectations.  She has met with medical oncologist given diagnosis of metastatic adenocarcinoma of the appendix.  She was given a copy of her final pathology report today.  Asked her to please call if she has any concerns or questions in the future.  24 minutes of total time was spent for this patient encounter, including preparation, face-to-face counseling with the patient and coordination of care, and documentation of the encounter.  Jeral Pinch, MD  Division of Gynecologic Oncology  Department of Obstetrics and Gynecology  [facility]

## 2022-01-08 ENCOUNTER — Ambulatory Visit: Payer: Managed Care, Other (non HMO) | Admitting: Neurology

## 2022-01-08 VITALS — BP 128/78 | HR 88 | Ht 67.0 in | Wt 134.0 lb

## 2022-01-08 DIAGNOSIS — I69320 Aphasia following cerebral infarction: Secondary | ICD-10-CM

## 2022-01-08 DIAGNOSIS — I69154 Hemiplegia and hemiparesis following nontraumatic intracerebral hemorrhage affecting left non-dominant side: Secondary | ICD-10-CM

## 2022-01-08 DIAGNOSIS — I69354 Hemiplegia and hemiparesis following cerebral infarction affecting left non-dominant side: Secondary | ICD-10-CM | POA: Diagnosis not present

## 2022-01-08 MED ORDER — MELATONIN 10 MG PO CAPS: 10.0000 mg | ORAL_CAPSULE | Freq: Every day | ORAL | 0 refills | Status: DC

## 2022-01-08 NOTE — Patient Instructions (Signed)
I had a long discussion with the patient and her husband regarding her remote intracerebral hemorrhage and residual significant aphasia and spastic right hemiplegia.  Recommend strict control of hypertension blood pressure goal below 140/90.  She was advised to use her cane at all times and we discussed fall safety precautions.  I recommend she start taking melatonin 10 mg at sunset to help with her insomnia and she may also use Tylenol PM or Advil PM in addition at that time.  If this does not work I have advised her to see primary care physician to get prescription sleep medications.  Continue ongoing speech therapy at [facility] speech program.  She will return for follow-up in the future only as necessary and no schedule appointment was made.

## 2022-01-08 NOTE — Progress Notes (Signed)
[facility] [address] [place]. [place] [address] [phone]       OFFICE FOLLOW-UP NOTE  [name] Date of Birth:  07/03/1966 Medical Record Number:  [id number]   HPI: Initial visit 07/10/2021 :[name] is a 56 year old pleasant Caucasian lady seen today for initial office follow-up visit following hospital admission for intracerebral hemorrhage in December 2020.  History is obtained from the patient, husband and sister accompanying her for this visit and review of electronic medical records and opossum reviewed pertinent available imaging films in PACS.  She presented on 07/09/2019 to [facility] with right-sided weakness which began abruptly after she returned home from work.  Her mental status subsequently progressively declined and husband called 911.  Code stroke was activated and CT scan of the head on admission showed a large left basal ganglia hemorrhage with volume of 44 cubic cc with 5 mm rightward midline shift.  She was admitted to the ICU and blood pressure is tightly controlled.  MRI scan of the brain subsequently confirmed left lentiform nucleus hemorrhage with estimated volume of 53 cubic cc and mild 67 with a midline shift but no hydrocephalus.  MR angiogram was negative for large vessel stenosis or aneurysms.  2D echo showed normal ejection fraction.  LDL cholesterol was elevated 156 mg percent and hemoglobin A1c was 5.3.  Urine drug screen was negative.  Patient had initial dysarthria and dysphagia and difficulty swallowing and had a core track tube placed but subsequently improved patient condition gradually stabilized and he was transferred to inpatient rehab.  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  Patient was meant to follow-up with me but was emotionally quite labile and during scheduled visit she had to leave before I could see her.  She is currently living  at home with her husband and does have a daytime caregiver when he is awake.  She has significant expressive aphasia and dysarthria can communicate daily but has good comprehension and understanding.  She still has dense right hemiplegia with practical no movement in the right upper extremity.  She is able to walk with a right foot drop with AFO.  She has finished physical occupational therapy as well as speech therapy.  She does own exercises at home.  Blood pressure is usually well controlled though today it is borderline in office at 145/87.  She is on Zoloft for depression which is better controlled now.  Patient wants to get better and wants more speech therapy if possible. Update 01/08/2022 : She returns for follow-up after last visit 6 months ago.  She is accompanied by husband.  Patient continues to have significant aphasia and right hemiplegia.  She is able to speak occasional words which are difficult to understand but few words are clear.  When she tries to speak sentences it is gibberish.  She has good comprehension can follow commands quite well.  She is enrolled in the [facility] speech therapy program and goes there once a week.  She is developed significant acne on her neck and has been seen by dermatology for that.  She is also been diagnosed with a L renal mass and underwent hysterectomy and follows with oncology Dr. Learta Madden and is scheduled to undergo PET scan soon.  Blood pressures under good control today it is 128/78.  She is tolerating Lipitor well without muscle aches and pains.  She has had trouble sleeping and  was unable to sleep on most nights.  She has not tried any sleeping aids yet.  She is able to ambulate with her walker but does not like to go out of the house stays indoors mostly. ROS:   14 system review of systems is positive for weakness, speech difficulties, difficulty walking and all other systems negative  PMH:  Past Medical History:  Diagnosis Date   Allergy    Anxiety     Depression    Expressive aphasia    Hemiparesis (HCC)    Right side   High triglycerides    History of kidney stones    Kidney stones    Recurrent sinus infections    Stroke [facility])     Social History:  Social History   Socioeconomic History   Marital status: Married    Spouse name: Not on file   Number of children: Not on file   Years of education: Not on file   Highest education level: Not on file  Occupational History   Not on file  Tobacco Use   Smoking status: Former   Smokeless tobacco: Former  Scientific laboratory technician Use: Never used  Substance and Sexual Activity   Alcohol use: Not Currently   Drug use: No   Sexual activity: Yes    Partners: Male  Other Topics Concern   Not on file  Social History Narrative   Not on file   Social Determinants of Health   Financial Resource Strain: Not on file  Food Insecurity: Not on file  Transportation Needs: Not on file  Physical Activity: Not on file  Stress: Not on file  Social Connections: Not on file  Intimate Partner Violence: Not on file    Medications:   Current Outpatient Medications on File Prior to Visit  Medication Sig Dispense Refill   amLODipine (NORVASC) 10 MG tablet Take 10 mg by mouth daily.     atorvastatin (LIPITOR) 20 MG tablet Take 1 tablet (20 mg total) by mouth daily at 6 PM. (Patient taking differently: Take 20 mg by mouth daily.) 30 tablet 1   B Complex-C (B-COMPLEX WITH VITAMIN C) tablet Take 1 tablet by mouth daily.     CALCIUM PO Take 1 tablet by mouth daily.     Cholecalciferol (VITAMIN D) 50 MCG (2000 UT) CAPS Take 2,000 Units by mouth daily.     ferrous sulfate 325 (65 FE) MG EC tablet Take 325 mg by mouth daily with breakfast.     magnesium 30 MG tablet Take 30 mg by mouth daily.     METRONIDAZOLE, TOPICAL, 0.75 % LOTN Apply topically.     Omega-3 Fatty Acids (FISH OIL PO) Take by mouth.     zinc gluconate 50 MG tablet Take 50 mg by mouth daily.     No current facility-administered  medications on file prior to visit.    Allergies:   Allergies  Allergen Reactions   Amoxicillin Nausea Only   Shellfish Allergy Diarrhea and Nausea And Vomiting    Mussels     Physical Exam General: Frail middle-aged Caucasian lady, seated, in no evident distress Head: head normocephalic and atraumatic.  Neck: supple with no carotid or supraclavicular bruits Cardiovascular: regular rate and rhythm, no murmurs Musculoskeletal: no deformity mild kyphoscoliosis.  Right foot drop.  Right AFO Skin:  no rash/petichiae Vascular:  Normal pulses all extremities Vitals:   01/08/22 1320  BP: 128/78  Pulse: 88   Neurologic Exam Mental Status: Awake and fully  alert.  Severe expressive aphasia unable to speak only occasional words and guttural sounds.  Good comprehension.  Unable to name or repeat.  Follows most commands mood and affect appropriate.  Cranial Nerves: Fundoscopic exam reveals sharp disc margins. Pupils equal, briskly reactive to light. Extraocular movements full without nystagmus. Visual fields show partial right homonymous hemianopsia to confrontation. Hearing intact. Facial sensation intact.  Mild right lower facial weakness., tongue, palate moves normally and symmetrically.  Motor: Spastic right hemiplegia with right upper extremity 0/5 in right lower extremity 2/5 strength proximally and 0/5 at the knee and ankles.  Tone is increased on the right compared to the left.  Normal strength on the left side.   Sensory.:  Slightly diminished  touch ,pinprick .position and vibratory sensation on the right side compared to the left.  Coordination: Normal on the left and cannot be tested on the right. Gait and Station: Arises from chair with slight difficulty. Stance is normal.  Hemiplegic gait with circumduction of the right foot with right foot drop and Mehringer AFO. Reflexes: 2+ and asymmetric and brisker on the right. Toes downgoing.     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  She continues to have significant deficits and unfortunately has not made great improvement     PLAN: I had a long discussion with the patient and her husband regarding her remote intracerebral hemorrhage and residual significant aphasia and spastic right hemiplegia.  Recommend strict control of hypertension blood pressure goal below 140/90.  She was advised to use her cane at all times and we discussed fall safety precautions.  I recommend she start taking melatonin 10 mg at sunset to help with her insomnia and she may also use Tylenol PM or Advil PM in addition at that time.  If this does not work I have advised her to see primary care physician to get prescription sleep medications.  Continue ongoing speech therapy at [facility] speech program.  She will return for follow-up in the future only as necessary and no schedule appointment was made. Greater than 50% of time during this 35 minute  visit was spent on counseling,explanation of diagnosis, planning of further management, discussion with patient and family and coordination of care Antony Contras, MD Note: This document was prepared with digital dictation and possible smart phrase technology. Any transcriptional errors that result from this process are unintentional

## 2022-01-09 ENCOUNTER — Telehealth: Payer: Self-pay | Admitting: *Deleted

## 2022-01-09 NOTE — Telephone Encounter (Signed)
Per Dr Berline Lopes fax records and pathology report to Dr Laurene Footman office

## 2022-01-17 ENCOUNTER — Ambulatory Visit (HOSPITAL_COMMUNITY)
Admission: RE | Admit: 2022-01-17 | Discharge: 2022-01-17 | Disposition: A | Discharge status: Home / Self Care | Payer: Managed Care, Other (non HMO) | Source: Ambulatory Visit | Attending: Oncology | Admitting: Oncology

## 2022-01-17 DIAGNOSIS — C181 Malignant neoplasm of appendix: Secondary | ICD-10-CM | POA: Diagnosis present

## 2022-01-17 LAB — GLUCOSE, CAPILLARY: Glucose-Capillary: 113 mg/dL — ABNORMAL HIGH (ref 70–99)

## 2022-01-17 MED ORDER — FLUDEOXYGLUCOSE F - 18 (FDG) INJECTION: 6.6400 | Freq: Once | INTRAVENOUS | Status: DC

## 2022-01-19 ENCOUNTER — Telehealth: Payer: Self-pay

## 2022-01-25 ENCOUNTER — Inpatient Hospital Stay: Payer: Managed Care, Other (non HMO) | Admitting: Oncology

## 2022-01-25 ENCOUNTER — Ambulatory Visit: Payer: Self-pay | Admitting: Oncology

## 2022-01-25 VITALS — BP 132/80 | HR 100 | Temp 98.1°F | Resp 18

## 2022-01-25 DIAGNOSIS — Z87891 Personal history of nicotine dependence: Secondary | ICD-10-CM | POA: Diagnosis not present

## 2022-01-25 DIAGNOSIS — C181 Malignant neoplasm of appendix: Secondary | ICD-10-CM

## 2022-01-25 NOTE — Progress Notes (Addendum)
[facility] [place] OFFICE PROGRESS NOTE   Diagnosis: Appendix carcinoma  INTERVAL HISTORY:   Melanie Madden returns as scheduled.  She is here with a caretaker and her husband.  No specific complaint aside from increased acne at the neck.  She has been seen by dermatology and recently started a new medication.  Objective:  Vital signs in last 24 hours:  Blood pressure 132/80, pulse 100, temperature 98.1 F (36.7 C), temperature source Oral, resp. rate 18, SpO2 100 %.    Lymphatics: No cervical, supraclavicular, axillary, or inguinal nodes Resp: Lungs clear bilaterally Cardio: Regular rate and rhythm GI: No hepatosplenomegaly, nontender, no mass Vascular: No leg edema  Skin: Pustular acne over the neck, scattered erythematous pustular lesions over the back  Lab Results:  Lab Results  Component Value Date   WBC 10.1 12/07/2021   HGB 14.4 12/07/2021   HCT 42.0 12/07/2021   MCV 91.9 12/07/2021   PLT 200 12/07/2021   NEUTROABS 3.3 01/08/2021    CMP  Lab Results  Component Value Date   NA 137 12/08/2021   K 3.4 (L) 12/08/2021   CL 103 12/08/2021   CO2 26 12/08/2021   GLUCOSE 112 (H) 12/08/2021   BUN 13 12/08/2021   CREATININE 0.71 12/08/2021   CALCIUM 9.2 12/08/2021   PROT 5.7 (L) 12/06/2021   ALBUMIN 3.1 (L) 12/06/2021   AST 47 (H) 12/06/2021   ALT 17 12/06/2021   ALKPHOS 55 12/06/2021   BILITOT 1.4 (H) 12/06/2021   GFRNONAA >60 12/08/2021   GFRAA >60 12/19/2019    Lab Results  Component Value Date   CEA1 2.90 11/06/2021     Medications: I have reviewed the patient's current medications.   Assessment/Plan: Goblet cell adenocarcinoma of the appendix, stage IV, (pT4a,pNx,pM1c) 12/06/2021 TAH/BSO, omentum resection, cul-de-sac biopsy-metastatic poorly differentiated carcinoma with signet ring cells involving bilateral ovaries, the omentum, anterior cul-de-sac biopsy suspicious for malignancy.  Resection margins negative. Foundation 1-carcinoid  status cannot be determined, tumor mutation burden cannot be determined, [id number], KMT2D(MLL2) MRI abdomen and pelvis 10/26/2021-new large bilateral adnexal masses concerning for bilateral ovarian neoplasm, stable 4 cm left adrenal gland lesion consistent with a benign adenoma, no evidence of omental disease, no adenopathy PET 01/17/2022-no evidence of residual or recurrent appendiceal carcinoma, small cutaneous nodule at the left posterior thorax with mild hypermetabolism, left thyroid goiter, 4 cm benign left adrenal adenoma 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   For lithiasis 4.   Admission 12/09/2020 with urosepsis secondary to obstructing right renal stone, status post percutaneous nephrostomy placement Cystoscopy with laser lithotripsy and right JJ stent placement 02/08/2021    Disposition: Melanie Madden appears stable.  She is in clinical remission from the appendix carcinoma.  She was diagnosed with stage IV disease when she underwent surgery on 12/06/2021.  She understands a high chance of developing recurrent disease over the next few years.  We discussed "adjuvant "systemic therapy with FOLFOX or single agent capecitabine.  She does not wish to receive chemotherapy at present.  She agrees to a follow-up visit in 3-4 months.  We will plan for surveillance imaging in approximately 6 months.  She will follow-up with her dermatologist for management of the pustular skin rash.  I suspect the thoracic level skin finding on PET is related to one of the pustular lesions at the left upper back.  Betsy Coder, MD  01/25/2022  12:19 PM

## 2022-04-23 ENCOUNTER — Inpatient Hospital Stay: Payer: Managed Care, Other (non HMO) | Attending: Gynecologic Oncology | Admitting: Oncology

## 2022-04-26 ENCOUNTER — Ambulatory Visit: Payer: Managed Care, Other (non HMO) | Admitting: Oncology

## 2022-05-21 ENCOUNTER — Inpatient Hospital Stay: Payer: Managed Care, Other (non HMO) | Admitting: Oncology

## 2022-05-25 ENCOUNTER — Inpatient Hospital Stay: Payer: Managed Care, Other (non HMO) | Attending: Gynecologic Oncology | Admitting: Oncology

## 2022-05-25 VITALS — BP 130/85 | HR 93 | Temp 98.1°F | Resp 20 | Ht 67.0 in

## 2022-05-25 DIAGNOSIS — G8191 Hemiplegia, unspecified affecting right dominant side: Secondary | ICD-10-CM | POA: Insufficient documentation

## 2022-05-25 DIAGNOSIS — Z79899 Other long term (current) drug therapy: Secondary | ICD-10-CM | POA: Insufficient documentation

## 2022-05-25 DIAGNOSIS — R4701 Aphasia: Secondary | ICD-10-CM | POA: Insufficient documentation

## 2022-05-25 DIAGNOSIS — C181 Malignant neoplasm of appendix: Secondary | ICD-10-CM

## 2022-05-25 NOTE — Progress Notes (Signed)
  [place] OFFICE PROGRESS NOTE   Diagnosis: Appendix carcinoma  INTERVAL HISTORY:   [name] is here with a caretaker.  Her husband was present by telephone for part of today's visit.  No new complaint.  No pain.  The pustular skin rash has improved.  Objective:  Vital signs in last 24 hours:  Blood pressure 130/85, pulse 93, temperature 98.1 F (36.7 C), temperature source Oral, resp. rate 20, height '5\' 7"'$  (1.702 m), SpO2 99 %.    HEENT: Neck without mass Lymphatics: No cervical, supraclavicular, axillary, or inguinal nodes Resp: Lungs clear with decreased breath sounds at the right lower posterior chest, no respiratory distress Cardio: Regular rate and rhythm GI: No hepatosplenomegaly, nontender, no mass Vascular: No leg edema Neuro: Right hemiplegia Skin: Acne type rash over the neck appears to be fading  Lab Results:  Lab Results  Component Value Date   WBC 10.1 12/07/2021   HGB 14.4 12/07/2021   HCT 42.0 12/07/2021   MCV 91.9 12/07/2021   PLT 200 12/07/2021   NEUTROABS 3.3 01/08/2021    CMP  Lab Results  Component Value Date   NA 137 12/08/2021   K 3.4 (L) 12/08/2021   CL 103 12/08/2021   CO2 26 12/08/2021   GLUCOSE 112 (H) 12/08/2021   BUN 13 12/08/2021   CREATININE 0.71 12/08/2021   CALCIUM 9.2 12/08/2021   PROT 5.7 (L) 12/06/2021   ALBUMIN 3.1 (L) 12/06/2021   AST 47 (H) 12/06/2021   ALT 17 12/06/2021   ALKPHOS 55 12/06/2021   BILITOT 1.4 (H) 12/06/2021   GFRNONAA >60 12/08/2021   GFRAA >60 12/19/2019    Lab Results  Component Value Date   CEA1 2.90 11/06/2021    Medications: I have reviewed the patient's current medications.   Assessment/Plan: Goblet cell adenocarcinoma of the appendix, stage IV, (pT4a,pNx,pM1c) 12/06/2021 TAH/BSO, omentum resection, cul-de-sac biopsy-metastatic poorly differentiated carcinoma with signet ring cells involving bilateral ovaries, the omentum, anterior cul-de-sac biopsy suspicious  for malignancy.  Resection margins negative. 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   For lithiasis 4.   Admission 12/09/2020 with urosepsis secondary to obstructing right renal stone, status post percutaneous nephrostomy placement Cystoscopy with laser lithotripsy and right JJ stent placement 02/08/2021     Disposition: [name] has a history of metastatic appendix carcinoma.  There is no clinical evidence of disease progression.  She agrees to a restaging CT evaluation in January.  She will be scheduled for an office visit a few days after the CTs.  She is reported to have a shellfish allergy.  She and her husband report the allergy is only to muscles with GI side effects.  No history of a rash or other symptoms after eating muscles.  We will contact radiology to be sure it is okay to give her IV contrast.  Betsy Coder, MD  05/25/2022  11:49 AM

## 2022-08-06 ENCOUNTER — Ambulatory Visit (HOSPITAL_BASED_OUTPATIENT_CLINIC_OR_DEPARTMENT_OTHER)
Admission: RE | Admit: 2022-08-06 | Discharge: 2022-08-06 | Disposition: A | Discharge status: Home / Self Care | Payer: Managed Care, Other (non HMO) | Source: Ambulatory Visit | Attending: Oncology | Admitting: Oncology

## 2022-08-06 ENCOUNTER — Inpatient Hospital Stay: Payer: Managed Care, Other (non HMO) | Attending: Gynecologic Oncology

## 2022-08-06 ENCOUNTER — Encounter (HOSPITAL_BASED_OUTPATIENT_CLINIC_OR_DEPARTMENT_OTHER): Payer: Self-pay

## 2022-08-06 DIAGNOSIS — R4701 Aphasia: Secondary | ICD-10-CM | POA: Insufficient documentation

## 2022-08-06 DIAGNOSIS — G8191 Hemiplegia, unspecified affecting right dominant side: Secondary | ICD-10-CM | POA: Insufficient documentation

## 2022-08-06 DIAGNOSIS — C181 Malignant neoplasm of appendix: Secondary | ICD-10-CM | POA: Diagnosis present

## 2022-08-06 LAB — BASIC METABOLIC PANEL - CANCER CENTER ONLY
Anion gap: 12 (ref 5–15)
BUN: 16 mg/dL (ref 6–20)
CO2: 27 mmol/L (ref 22–32)
Calcium: 10.3 mg/dL (ref 8.9–10.3)
Chloride: 102 mmol/L (ref 98–111)
Creatinine: 0.8 mg/dL (ref 0.44–1.00)
GFR, Estimated: >60 mL/min
Glucose, Bld: 114 mg/dL — ABNORMAL HIGH (ref 70–99)
Potassium: 3.8 mmol/L (ref 3.5–5.1)
Sodium: 141 mmol/L (ref 135–145)

## 2022-08-06 LAB — CEA (ACCESS): CEA (CHCC): 2.64 ng/mL (ref 0.00–5.00)

## 2022-08-06 MED ORDER — IOHEXOL 300 MG/ML  SOLN
100.0000 mL | Freq: Once | INTRAMUSCULAR | Status: AC | PRN
  Administered 2022-08-06: 85 mL via INTRAVENOUS

## 2022-08-10 ENCOUNTER — Inpatient Hospital Stay: Payer: Managed Care, Other (non HMO) | Admitting: Oncology

## 2022-08-10 VITALS — BP 123/87 | HR 92 | Temp 98.1°F | Resp 18 | Ht 67.0 in

## 2022-08-10 DIAGNOSIS — R4701 Aphasia: Secondary | ICD-10-CM | POA: Diagnosis not present

## 2022-08-10 DIAGNOSIS — C181 Malignant neoplasm of appendix: Secondary | ICD-10-CM | POA: Diagnosis not present

## 2022-08-10 DIAGNOSIS — G8191 Hemiplegia, unspecified affecting right dominant side: Secondary | ICD-10-CM | POA: Diagnosis not present

## 2022-08-10 NOTE — Progress Notes (Signed)
[place] OFFICE PROGRESS NOTE   Diagnosis: Appendix course,  INTERVAL HISTORY:   [name] returns for a scheduled visit.  She is here with a caretaker.  Her husband is present by telephone.  She complains of depression secondary to the stroke.  No other complaint.  She answers questions with "no ".  Her husband says she has a good appetite, improvement in the skin rash, and no new complaint.  Objective:  Vital signs in last 24 hours:  Blood pressure 123/87, pulse 92, temperature 98.1 F (36.7 C), temperature source Oral, resp. rate 18, height '5\' 7"'$  (1.702 m), SpO2 100 %.    Patient refused physical examination  Lab Results:  Lab Results  Component Value Date   WBC 10.1 12/07/2021   HGB 14.4 12/07/2021   HCT 42.0 12/07/2021   MCV 91.9 12/07/2021   PLT 200 12/07/2021   NEUTROABS 3.3 01/08/2021    CMP  Lab Results  Component Value Date   NA 141 08/06/2022   K 3.8 08/06/2022   CL 102 08/06/2022   CO2 27 08/06/2022   GLUCOSE 114 (H) 08/06/2022   BUN 16 08/06/2022   CREATININE 0.80 08/06/2022   CALCIUM 10.3 08/06/2022   PROT 5.7 (L) 12/06/2021   ALBUMIN 3.1 (L) 12/06/2021   AST 47 (H) 12/06/2021   ALT 17 12/06/2021   ALKPHOS 55 12/06/2021   BILITOT 1.4 (H) 12/06/2021   GFRNONAA >60 08/06/2022   GFRAA >60 12/19/2019    Lab Results  Component Value Date   CEA1 2.90 11/06/2021   CEA 2.64 08/06/2022    Lab Results  Component Value Date   INR 1.8 (H) 12/10/2020   LABPROT 20.7 (H) 12/10/2020    Imaging:  CT CHEST ABDOMEN PELVIS W CONTRAST  Addendum Date: 08/08/2022   ADDENDUM REPORT: 08/08/2022 10:31 ADDENDUM: Voice recognition error: 57 year old female. Electronically Signed   By: Suzy Bouchard M.D.   On: 08/08/2022 10:31   Result Date: 08/08/2022 CLINICAL DATA:  57 year old female. Restaging appendiceal carcinoma. * Tracking Code: BO * patient status post debulking laparoscopic hysterectomy and oophorectomy. EXAM: CT CHEST,  ABDOMEN, AND PELVIS WITH CONTRAST TECHNIQUE: Multidetector CT imaging of the chest, abdomen and pelvis was performed following the standard protocol during bolus administration of intravenous contrast. RADIATION DOSE REDUCTION: This exam was performed according to the departmental dose-optimization program which includes automated exposure control, adjustment of the mA and/or kV according to patient size and/or use of iterative reconstruction technique. CONTRAST:  20m OMNIPAQUE IOHEXOL 300 MG/ML  SOLN COMPARISON:  PET-CT 01/17/2022, CT 01/08/2021 FINDINGS: CT CHEST FINDINGS CT CHEST FINDINGS Cardiovascular: No significant vascular findings. Normal heart size. No pericardial effusion. Mediastinum/Nodes: No mediastinal hilar adenopathy. Enlarged LEFT lobe of thyroid gland measures 3.8 cm. No significant hypermetabolic activity on comparison FDG PET scan consistent with benign goiter. No axillary lymphadenopathy. Lungs/Pleura: No suspicious pulmonary nodules. Normal pleural. Airways normal. Musculoskeletal: No aggressive osseous lesion. CT ABDOMEN AND PELVIS FINDINGS Hepatobiliary: No focal hepatic lesion. Hypodensity adjacent to the gallbladder fundus (image 63/2 appears similar comparison exams and not metabolically active. Common bile duct normal caliber Pancreas: Pancreas is normal. No ductal dilatation. No pancreatic inflammation. Spleen: Normal spleen Adrenals/urinary tract: Large benign LEFT adrenal adenoma unchanged from comparison exams measuring 4.3 cm. RIGHT adrenal gland normal. Several nonobstructing renal calculi are present. No ureteral lithiasis or obstructive uropathy. Bladder normal. Stomach/Bowel: The stomach, duodenum, and small bowel normal. Post appendectomy. No nodularity in the RIGHT lower quadrant. Moderate volume stool throughout the  colon. Rectosigmoid colon normal. Vascular/Lymphatic: Abdominal aorta is normal caliber. There is no retroperitoneal or periportal lymphadenopathy. No pelvic  lymphadenopathy. Reproductive: Post hysterectomy and oophorectomy. Other: No peritoneal nodularity.  No nodularity within the pelvis. Musculoskeletal: No aggressive osseous lesion. IMPRESSION: CHEST IMPRESSION: No evidence of thoracic metastasis. PELVIS IMPRESSION: 1. No evidence of appendiceal carcinoma recurrence or metastasis in the abdomen pelvis. 2. Post appendectomy and hysterectomy. 3. Benign LEFT adrenal adenoma. 4. Bilateral nephrolithiasis.  No obstructive uropathy. 5. Small rim of fluid adjacent to the gallbladder fundus is favored benign. Recommend attention on routine surveillance. Electronically Signed: By: Suzy Bouchard M.D. On: 08/07/2022 10:26    Medications: I have reviewed the patient's current medications.   Assessment/Plan: Goblet cell adenocarcinoma of the appendix, stage IV, (pT4a,pNx,pM1c) 12/06/2021 TAH/BSO, omentum resection, cul-de-sac biopsy-metastatic poorly differentiated carcinoma with signet ring cells involving bilateral ovaries, the omentum, anterior cul-de-sac biopsy suspicious for malignancy.  Resection margins negative. Foundation 1-carcinoid status cannot be determined, tumor mutation burden cannot be determined, [id number], KMT2D(MLL2) MRI abdomen and pelvis 10/26/2021-new large bilateral adnexal masses concerning for bilateral ovarian neoplasm, stable 4 cm left adrenal gland lesion consistent with a benign adenoma, no evidence of omental disease, no adenopathy PET 01/17/2022-no evidence of residual or recurrent appendiceal carcinoma, small cutaneous nodule at the left posterior thorax with mild hypermetabolism, left thyroid goiter, 4 cm benign left adrenal adenoma CTs 08/06/2022-no evidence of progressive disease, fluid adjacent to the gallbladder fundus 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   For lithiasis 4.   Admission 12/09/2020 with urosepsis secondary to obstructing right renal stone,  status post percutaneous nephrostomy placement Cystoscopy with laser lithotripsy and right JJ stent placement 02/08/2021      Disposition: [name] has a history of stage IV appendix carcinoma.  There is no clinical or radiologic evidence of disease progression.  However she declined physical examination today.  I recommend an office visit and CEA in 4 months.  I discussed this recommendation with her husband telephone.  She declined to schedule an appointment today.  He will talk with her and the Cancer center scheduling staff to arrange for a follow-up appointment in 4 months. She will follow-up with her neurologist or primary provider for management of depression.  Betsy Coder, MD  08/10/2022  12:41 PM

## 2022-12-13 ENCOUNTER — Other Ambulatory Visit: Payer: Managed Care, Other (non HMO)

## 2022-12-13 ENCOUNTER — Ambulatory Visit: Payer: Managed Care, Other (non HMO) | Admitting: Oncology

## 2023-01-01 ENCOUNTER — Inpatient Hospital Stay: Payer: Managed Care, Other (non HMO) | Admitting: Oncology

## 2023-01-01 ENCOUNTER — Encounter: Payer: Self-pay | Admitting: *Deleted

## 2023-01-01 ENCOUNTER — Inpatient Hospital Stay: Payer: Managed Care, Other (non HMO) | Attending: Oncology

## 2023-01-01 VITALS — BP 145/89 | HR 83 | Temp 98.1°F | Resp 18 | Ht 67.0 in

## 2023-01-01 DIAGNOSIS — Z90710 Acquired absence of both cervix and uterus: Secondary | ICD-10-CM | POA: Diagnosis not present

## 2023-01-01 DIAGNOSIS — C181 Malignant neoplasm of appendix: Secondary | ICD-10-CM

## 2023-01-01 DIAGNOSIS — Z85038 Personal history of other malignant neoplasm of large intestine: Secondary | ICD-10-CM | POA: Diagnosis present

## 2023-01-01 DIAGNOSIS — Z9079 Acquired absence of other genital organ(s): Secondary | ICD-10-CM | POA: Insufficient documentation

## 2023-01-01 DIAGNOSIS — Z90722 Acquired absence of ovaries, bilateral: Secondary | ICD-10-CM | POA: Diagnosis not present

## 2023-01-01 LAB — CEA (ACCESS): CEA (CHCC): 2.81 ng/mL (ref 0.00–5.00)

## 2023-01-01 NOTE — Progress Notes (Signed)
PATIENT NAVIGATOR PROGRESS NOTE  Name: Melanie Madden Date: 01/01/2023 MRN: [id number]  DOB: 08-14-1965   Reason for visit:  Telephone call  Comments:  Called and spoke with [name]'s husband, [name].  Reviewed CEA results with him and Dr Truett Perna recommendation for CT scan in 6mos with labs and Dr visit. [name] would like to schedule, call transferred to scheduler.    Time spent counseling/coordinating care: 30-45 minutes

## 2023-01-01 NOTE — Progress Notes (Signed)
  [facility] OFFICE PROGRESS NOTE   Diagnosis: Appendix carcinoma  INTERVAL HISTORY:   Ms. Consepcion Hearing returns as scheduled.  She is here with a caretaker.  She has no complaint.  She is uncooperative to interview and will not allow for a physical examination.  The caretaker reports she has a good appetite and no pain.  Objective:  Vital signs in last 24 hours:  Blood pressure (!) 145/89, pulse 83, temperature 98.1 F (36.7 C), temperature source Oral, resp. rate 18, height 5\' 7"  (1.702 m), SpO2 98 %.   Physical examination not performed due to patient refusal  Lab Results:  Lab Results  Component Value Date   WBC 10.1 12/07/2021   HGB 14.4 12/07/2021   HCT 42.0 12/07/2021   MCV 91.9 12/07/2021   PLT 200 12/07/2021   NEUTROABS 3.3 01/08/2021    CMP  Lab Results  Component Value Date   NA 141 08/06/2022   K 3.8 08/06/2022   CL 102 08/06/2022   CO2 27 08/06/2022   GLUCOSE 114 (H) 08/06/2022   BUN 16 08/06/2022   CREATININE 0.80 08/06/2022   CALCIUM 10.3 08/06/2022   PROT 5.7 (L) 12/06/2021   ALBUMIN 3.1 (L) 12/06/2021   AST 47 (H) 12/06/2021   ALT 17 12/06/2021   ALKPHOS 55 12/06/2021   BILITOT 1.4 (H) 12/06/2021   GFRNONAA >60 08/06/2022   GFRAA >60 12/19/2019    Lab Results  Component Value Date   CEA1 2.90 11/06/2021   CEA 2.81 01/01/2023   Imaging:  No results found.  Medications: I have reviewed the patient's current medications.   Assessment/Plan: Goblet cell adenocarcinoma of the appendix, stage IV, (pT4a,pNx,pM1c) 12/06/2021 TAH/BSO, omentum resection, cul-de-sac biopsy-metastatic poorly differentiated carcinoma with signet ring cells involving bilateral ovaries, the omentum, anterior cul-de-sac biopsy suspicious for malignancy.  Resection margins negative. Foundation 1-microsatellite status cannot be determined, tumor mutation burden cannot be determined, [id number], KMT2D(MLL2) MRI abdomen and pelvis 10/26/2021-new large bilateral  adnexal masses concerning for bilateral ovarian neoplasm, stable 4 cm left adrenal gland lesion consistent with a benign adenoma, no evidence of omental disease, no adenopathy PET 01/17/2022-no evidence of residual or recurrent appendiceal carcinoma, small cutaneous nodule at the left posterior thorax with mild hypermetabolism, left thyroid goiter, 4 cm benign left adrenal adenoma CTs 08/06/2022-no evidence of progressive disease, fluid adjacent to the gallbladder fundus 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   Nephrolithiasis 4.   Admission 12/09/2020 with urosepsis secondary to obstructing right renal stone, status post percutaneous nephrostomy placement Cystoscopy with laser lithotripsy and right JJ stent placement 02/08/2021       Disposition: Melanie Madden -Andi Hence was diagnosed with appendix carcinoma in May 2023.  She had metastatic disease at diagnosis.  Restaging CTs in January revealed no evidence of disease progression.  There is no clinical evidence of disease progression.  The CEA is normal.  She would not allow a physical examination today.  She would not cooperate with interview.  She declined a follow-up visit.  I recommend restaging CTs and an office visit in 6 months.  We will contact her husband with this recommendation.  We will request mismatch repair protein and MSI testing on the resected tumor from 2023.  Thornton Papas, MD  01/01/2023  12:22 PM

## 2023-01-16 ENCOUNTER — Emergency Department (HOSPITAL_COMMUNITY)
Admission: EM | Admit: 2023-01-16 | Discharge: 2023-01-16 | Disposition: A | Discharge status: Home / Self Care | Payer: Managed Care, Other (non HMO) | Attending: Emergency Medicine | Admitting: Emergency Medicine

## 2023-01-16 ENCOUNTER — Telehealth: Payer: Self-pay | Admitting: Neurology

## 2023-01-16 ENCOUNTER — Emergency Department (HOSPITAL_COMMUNITY): Payer: Managed Care, Other (non HMO)

## 2023-01-16 ENCOUNTER — Other Ambulatory Visit: Payer: Self-pay

## 2023-01-16 DIAGNOSIS — I10 Essential (primary) hypertension: Secondary | ICD-10-CM | POA: Diagnosis not present

## 2023-01-16 DIAGNOSIS — R109 Unspecified abdominal pain: Secondary | ICD-10-CM | POA: Diagnosis present

## 2023-01-16 DIAGNOSIS — N200 Calculus of kidney: Secondary | ICD-10-CM | POA: Insufficient documentation

## 2023-01-16 DIAGNOSIS — Z79899 Other long term (current) drug therapy: Secondary | ICD-10-CM | POA: Diagnosis not present

## 2023-01-16 DIAGNOSIS — N3000 Acute cystitis without hematuria: Secondary | ICD-10-CM | POA: Insufficient documentation

## 2023-01-16 LAB — COMPREHENSIVE METABOLIC PANEL
ALT: 19 U/L (ref 0–44)
AST: 17 U/L (ref 15–41)
Albumin: 3.8 g/dL (ref 3.5–5.0)
Alkaline Phosphatase: 89 U/L (ref 38–126)
Anion gap: 9 (ref 5–15)
BUN: 20 mg/dL (ref 6–20)
CO2: 26 mmol/L (ref 22–32)
Calcium: 9.9 mg/dL (ref 8.9–10.3)
Chloride: 103 mmol/L (ref 98–111)
Creatinine, Ser: 0.88 mg/dL (ref 0.44–1.00)
GFR, Estimated: >60 mL/min (ref >60)
Glucose, Bld: 166 mg/dL — ABNORMAL HIGH (ref 70–99)
Potassium: 3.5 mmol/L (ref 3.5–5.1)
Sodium: 138 mmol/L (ref 135–145)
Total Bilirubin: 0.7 mg/dL (ref 0.3–1.2)
Total Protein: 7.1 g/dL (ref 6.5–8.1)

## 2023-01-16 LAB — LIPASE, BLOOD: Lipase: 31 U/L (ref 11–51)

## 2023-01-16 LAB — CBC WITH DIFFERENTIAL/PLATELET
Abs Immature Granulocytes: 0.03 10*3/uL (ref 0.00–0.07)
Basophils Absolute: 0 10*3/uL (ref 0.0–0.1)
Basophils Relative: 0 %
Eosinophils Absolute: 0.1 10*3/uL (ref 0.0–0.5)
Eosinophils Relative: 1 %
HCT: 44.6 % (ref 36.0–46.0)
Hemoglobin: 14.8 g/dL (ref 12.0–15.0)
Immature Granulocytes: 1 %
Lymphocytes Relative: 26 %
Lymphs Abs: 1.7 10*3/uL (ref 0.7–4.0)
MCH: 29.6 pg (ref 26.0–34.0)
MCHC: 33.2 g/dL (ref 30.0–36.0)
MCV: 89.2 fL (ref 80.0–100.0)
Monocytes Absolute: 0.5 10*3/uL (ref 0.1–1.0)
Monocytes Relative: 8 %
Neutro Abs: 4 10*3/uL (ref 1.7–7.7)
Neutrophils Relative %: 64 %
Platelets: 186 10*3/uL (ref 150–400)
RBC: 5 MIL/uL (ref 3.87–5.11)
RDW: 13.5 % (ref 11.5–15.5)
WBC: 6.3 10*3/uL (ref 4.0–10.5)
nRBC: 0 % (ref 0.0–0.2)

## 2023-01-16 LAB — I-STAT BETA HCG BLOOD, ED (MC, WL, AP ONLY): I-stat hCG, quantitative: 13.5 m[IU]/mL — ABNORMAL HIGH (ref <5)

## 2023-01-16 LAB — URINALYSIS, ROUTINE W REFLEX MICROSCOPIC
Bilirubin Urine: NEGATIVE
Glucose, UA: NEGATIVE mg/dL
Ketones, ur: NEGATIVE mg/dL
Nitrite: NEGATIVE
Protein, ur: NEGATIVE mg/dL
Specific Gravity, Urine: 1.011 (ref 1.005–1.030)
WBC, UA: >50 WBC/hpf (ref 0–5)
pH: 6 (ref 5.0–8.0)

## 2023-01-16 MED ORDER — SODIUM CHLORIDE 0.9 % IV SOLN
1.0000 g | Freq: Once | INTRAVENOUS | Status: AC
  Administered 2023-01-16: 1 g via INTRAVENOUS
  Filled 2023-01-16: qty 10

## 2023-01-16 MED ORDER — MORPHINE SULFATE (PF) 4 MG/ML IV SOLN
4.0000 mg | Freq: Once | INTRAVENOUS | Status: AC
  Administered 2023-01-16: 4 mg via INTRAVENOUS
  Filled 2023-01-16: qty 1

## 2023-01-16 MED ORDER — HYDROCODONE-ACETAMINOPHEN 5-325 MG PO TABS: 1.0000 | ORAL_TABLET | Freq: Four times a day (QID) | ORAL | 0 refills | Status: AC | PRN

## 2023-01-16 MED ORDER — HYDROMORPHONE HCL 1 MG/ML IJ SOLN
0.5000 mg | Freq: Once | INTRAMUSCULAR | Status: AC
  Administered 2023-01-16: 0.5 mg via INTRAVENOUS
  Filled 2023-01-16: qty 1

## 2023-01-16 MED ORDER — CEPHALEXIN 500 MG PO CAPS: 500.0000 mg | ORAL_CAPSULE | Freq: Four times a day (QID) | ORAL | 0 refills | Status: DC

## 2023-01-16 MED ORDER — ONDANSETRON HCL 4 MG PO TABS: 4.0000 mg | ORAL_TABLET | Freq: Four times a day (QID) | ORAL | 0 refills | Status: DC

## 2023-01-16 NOTE — ED Provider Notes (Signed)
[place] [facility]GENCY DEPARTMENT AT [facility] Provider Note   CSN: [id number] Arrival date & time: 01/16/23  1138     History  Chief Complaint  Patient presents with   Flank Pain    Melanie Madden is a 57 y.o. female history of ICH, cytotoxic brain edema, infarct of left basal ganglia, kidney stones, septic shock, thrombocytopenia, hypoalbuminemia, hypertension presented with 3 days of right flank pain.  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  Patient stated that she is unsure what causes her pain but that she is in constant pain.  Patient indicated that her suprapubic area has been distended and caregiver notes that patient has been urinating without issue and not endorse any dysuria or hematuria.  Patient does have history of kidney stones and caregiver and husband are concerned that patient has another 1.  Patient, husband, caregiver denied fevers, chest pain, shortness of breath, nausea/vomiting, pain with movement, recent illness, fever, nausea/vomiting, change in sensation/motor skills, vaginal bleeding or discharge  Home Medications Prior to Admission medications   Medication Sig Start Date End Date Taking? Authorizing Provider  cephALEXin (KEFLEX) 500 MG capsule Take 1 capsule (500 mg total) by mouth 4 (four) times daily. 01/16/23  Yes Andreina Outten, Beverly Gust, PA-C  ondansetron (ZOFRAN) 4 MG tablet Take 1 tablet (4 mg total) by mouth every 6 (six) hours. 01/16/23  Yes Evlyn Kanner T, PA-C  amLODipine (NORVASC) 10 MG tablet Take 10 mg by mouth daily.    [provider]  atorvastatin (LIPITOR) 20 MG tablet Take 1 tablet (20 mg total) by mouth daily at 6 PM. Patient taking differently: Take 20 mg by mouth daily. 08/20/19   Angiulli, Mcarthur Rossetti, PA-C  B Complex-C (B-COMPLEX WITH VITAMIN C) tablet Take 1 tablet by mouth daily.    [provider]  CALCIUM  PO Take 1 tablet by mouth daily.    [provider]  Cholecalciferol (VITAMIN D) 50 MCG (2000 UT) CAPS Take 2,000 Units by mouth daily.    [provider]  ferrous sulfate 325 (65 FE) MG EC tablet Take 325 mg by mouth daily with breakfast.    [provider]  HYDROcodone-acetaminophen (NORCO) 5-325 MG tablet Take 1 tablet by mouth every 6 (six) hours as needed for up to 5 days for moderate pain. 01/16/23 01/21/23 Yes Tagen Brethauer, Beverly Gust, PA-C  magnesium 30 MG tablet Take 30 mg by mouth daily.    [provider]  Melatonin 10 MG CAPS Take 10 mg by mouth daily after supper. Take at sunset 01/08/22   Micki Riley, MD  METRONIDAZOLE, TOPICAL, 0.75 % LOTN Apply topically. 10/11/21   [provider]  Omega-3 Fatty Acids (FISH OIL PO) Take by mouth.    [provider]  zinc gluconate 50 MG tablet Take 50 mg by mouth daily.    [provider]      Allergies    Amoxicillin and Shellfish allergy    Review of Systems   Review of Systems  Genitourinary:  Positive for flank pain.    Physical Exam Updated Vital Signs BP (!) 155/86   Pulse 81   Temp 98 F (36.7 C) (Oral)   Resp 18   Ht 5\' 7"  (1.702 m)   Wt 60 kg   SpO2 98%   BMI 20.72 kg/m  Physical Exam Constitutional:      General:  She is not in acute distress. Cardiovascular:     Rate and Rhythm: Normal rate and regular rhythm.     Pulses: Normal pulses.     Heart sounds: Normal heart sounds.  Pulmonary:     Effort: Pulmonary effort is normal. No respiratory distress.     Breath sounds: Normal breath sounds.  Abdominal:     General: There is distension (Suprapubic).     Tenderness: There is no abdominal tenderness. There is right CVA tenderness. There is no left CVA tenderness, guarding or rebound.  Musculoskeletal:        General: No deformity.     Comments: Unable to use the right side of her body due to previous stroke Left-sided grip 5 out of 5, hip flexion 5 out of  5 Ribs nontender to palpation and no abnormalities were palpated  Skin:    General: Skin is warm and dry.     Capillary Refill: Capillary refill takes less than 2 seconds.     Comments: No overlying skin color changes  Neurological:     Mental Status: She is alert.     Comments: Deficits on the right side and upper arm lower extremities due to previous stroke  Psychiatric:        Mood and Affect: Mood normal.     ED Results / Procedures / Treatments   Labs (all labs ordered are listed, but only abnormal results are displayed) Labs Reviewed  COMPREHENSIVE METABOLIC PANEL - Abnormal; Notable for the following components:      Result Value   Glucose, Bld 166 (*)    All other components within normal limits  URINALYSIS, ROUTINE W REFLEX MICROSCOPIC - Abnormal; Notable for the following components:   Color, Urine YELLOW (*)    APPearance CLOUDY (*)    Hgb urine dipstick SMALL (*)    Leukocytes,Ua LARGE (*)    Bacteria, UA MANY (*)    All other components within normal limits  I-STAT BETA HCG BLOOD, ED ([facility], [facility], [facility] ONLY) - Abnormal; Notable for the following components:   I-stat hCG, quantitative 13.5 (*)    All other components within normal limits  URINE CULTURE  CBC WITH DIFFERENTIAL/PLATELET  LIPASE, BLOOD    EKG None  Radiology CT RENAL STONE STUDY  Result Date: 01/16/2023 CLINICAL DATA:  Right lower abdominal pain and back pain. History of appendiceal carcinoma EXAM: CT ABDOMEN AND PELVIS WITHOUT CONTRAST TECHNIQUE: Multidetector CT imaging of the abdomen and pelvis was performed following the standard protocol without IV contrast. RADIATION DOSE REDUCTION: This exam was performed according to the departmental dose-optimization program which includes automated exposure control, adjustment of the mA and/or kV according to patient size and/or use of iterative reconstruction technique. COMPARISON:  CT 08/06/2022.  MRI 2023 March FINDINGS: Lower chest: Slight bronchiectasis the  lung bases. There is some linear opacities likely scar or atelectasis. No pleural effusion. Hepatobiliary: On this non IV contrast exam, the liver is grossly preserved. The gallbladder is nondilated. Pancreas: Unremarkable. No pancreatic ductal dilatation or surrounding inflammatory changes. Spleen: Spleen is nonenlarged.  Small splenic granuloma. Adrenals/Urinary Tract: Right adrenal gland is preserved. Cystic lesion involving the left adrenal gland is stable measuring 4.2 cm in transverse dimension and previously 4.4 cm. Postcontrast on the prior this had complex enhancing features. Please correlate with prior workup. Kidneys show several bilateral nonobstructing renal stones, similar to previous. No collecting system dilatation. No ureteral stones. Mild atrophy of the left kidney. Preserved contours of the urinary bladder.  Stomach/Bowel: No oral contrast. Stomach and small bowel are nondilated. Mild luminal gastric fluid and debris. Large bowel is of normal course and caliber with moderate stool. Redundant course seen of the transverse colon. Surgical changes at the base of the cecum. Vascular/Lymphatic: Aortic atherosclerosis. No enlarged abdominal or pelvic lymph nodes. Reproductive: Status post hysterectomy. No adnexal masses. Other: No free air or free fluid. Musculoskeletal: Curvature and degenerative changes along the spine. Degenerative changes of the pelvis. Multilevel disc bulging with canal stenosis in the lumbar spine. IMPRESSION: No bowel obstruction.  Scattered stool. Multiple bilateral nonobstructing renal stones. No ureteral stone seen at this time. Stable left adrenal nodule.  Please correlate with prior workup. Curvature and degenerative changes of the spine. Multilevel stenosis. Electronically Signed   By: Karen Kays M.D.   On: 01/16/2023 15:07    Procedures Procedures    Medications Ordered in ED Medications  morphine (PF) 4 MG/ML injection 4 mg (4 mg Intravenous Given 01/16/23 1236)   HYDROmorphone (DILAUDID) injection 0.5 mg (0.5 mg Intravenous Given 01/16/23 1343)  cefTRIAXone (ROCEPHIN) 1 g in sodium chloride 0.9 % 100 mL IVPB (0 g Intravenous Stopped 01/16/23 1703)    ED Course/ Medical Decision Making/ A&P                             Medical Decision Making Amount and/or Complexity of Data Reviewed Labs: ordered. Radiology: ordered.  Risk Prescription drug management.   Alida Blecha Paiva Dasilva-Melo 57 y.o. presented today for right flank pain. Working DDx that I considered at this time includes, but not limited to, MSK, herpes zoster, gastroenteritis, colitis, small bowel obstruction, appendicitis, cholecystitis, pancreatitis, nephrolithiasis, AAA, UTI, pyelonephritis.  R/o DDx: MSK, herpes zoster, gastroenteritis, colitis, small bowel obstruction, appendicitis, cholecystitis, pancreatitis, AAA, pyelonephritis: These are considered less likely due to history of present illness and physical exam findings.  Review of prior external notes: 01/01/2023 office visit  Unique Tests and My Interpretation:  CBC with differential: Unremarkable CMP: Remarkable Lipase: Negative UA: Large leukocytes I-STAT beta-hCG: Negative CT Renal Study: Multiple stones without obstruction  Discussion with Independent Historian:  Caregiver and husband  Discussion of Management of Tests: None  Risk: Medium: prescription drug management  Risk Stratification Score: None  Staffed with Jeraldine Loots, MD   Plan: Patient presented for abdominal pain. On exam patient was in no acute distress and stable vitals.  Patient's caregiver was present while patient's husband was present via phone.  Patient physical exam did show mild distention in the suprapubic region but patient not have any peritoneal signs or tenderness throughout her abdominal exam.  Patient did have right CVA tenderness on exam.  With patient's history of kidney stones CT renal study was ordered.  Patient was given morphine  for pain management will be reevaluated.  Patient stable at this time.  On recheck patient stated that morphine did not help and will be given 0.5 mg Dilaudid.  Patient's labs came back and patient's slightly elevated i-STAT beta-hCG of 13.5.  Patient states that she has been postmenopausal for years and is not concerned for any pregnancy.  This conversation was had with the caregiver present.  The rest patient's labs are reassuring as we await UA and CT.  CT shows multiple stones bilaterally that are not obstructing.  This may explain patient's symptoms.  Patient's UA shows questionable UTI and after speaking with pharmacy patient was given 1 dose of IV Rocephin and discharged on Keflex.  Urine culture was also ordered as well.  Anticipate antibiotics and urology follow-up.  Patient stable at this time.  Patient was given return precautions. Patient stable for discharge at this time.  Patient verbalized understanding of plan.         Final Clinical Impression(s) / ED Diagnoses Final diagnoses:  Nephrolithiasis  Acute cystitis without hematuria    Rx / DC Orders ED Discharge Orders          Ordered    HYDROcodone-acetaminophen (NORCO) 5-325 MG tablet  Every 6 hours PRN        01/16/23 1624    cephALEXin (KEFLEX) 500 MG capsule  4 times daily        01/16/23 1625    ondansetron (ZOFRAN) 4 MG tablet  Every 6 hours        01/16/23 1625              Remi Deter 01/16/23 1714    Gerhard Munch, MD 01/19/23 [phone]

## 2023-01-16 NOTE — ED Notes (Signed)
Fayrene Fearing, PA notified of pt pain level 8/10

## 2023-01-16 NOTE — Discharge Instructions (Addendum)
Please pick up the antibiotics and pain meds I prescribed for you.  Your CT shows that you have stones in your urine showing possible UTI.  You are treated with 1 dose of antibiotics here.  Please follow-up with a urologist I have attached your for you or one of your choosing.  If symptoms change or worsen please return to ER.

## 2023-01-16 NOTE — ED Triage Notes (Signed)
Pt c/o right lower abd pain that radiates  to right lower back x 3 days. Denies urinary symptoms

## 2023-01-16 NOTE — Telephone Encounter (Signed)
Received a letter from [facility] case manager advising that they will be closing the case management case for the pt. The letter indicated reaching out if anything else is needed from them. The case manager contact would be, Ezzie Dural, RN Personal nurse advocate. Phone:[phone] ext [phone] Fax [phone].

## 2023-01-17 LAB — URINE CULTURE: Culture: >=100000 — AB

## 2023-01-18 LAB — URINE CULTURE

## 2023-01-19 ENCOUNTER — Telehealth (HOSPITAL_BASED_OUTPATIENT_CLINIC_OR_DEPARTMENT_OTHER): Payer: Self-pay | Admitting: *Deleted

## 2023-01-19 NOTE — Telephone Encounter (Signed)
Post ED Visit - Positive Culture Follow-up  Culture report reviewed by antimicrobial stewardship pharmacist: [facility] []  Enzo Bi, Pharm.D. []  Celedonio Miyamoto, Pharm.D., BCPS AQ-ID []  Garvin Fila, Pharm.D., BCPS []  Georgina Pillion, Pharm.D., BCPS []  Strong, [place].D., BCPS, AAHIVP []  Estella Husk, Pharm.D., BCPS, AAHIVP []  Lysle Pearl, PharmD, BCPS []  Phillips Climes, PharmD, BCPS []  Agapito Games, PharmD, BCPS []  Verlan Friends, PharmD []  Mervyn Gay, PharmD, BCPS []  Vinnie Level, PharmD  [facility] []  Len Childs, PharmD []  Greer Pickerel, PharmD []  Adalberto Cole, PharmD []  Perlie Gold, Rph []  Lonell Face) Jean Rosenthal, PharmD [x]  Earl Many, PharmD []  Junita Push, PharmD []  Dorna Leitz, PharmD []  Terrilee Files, PharmD []  Lynann Beaver, PharmD []  Keturah Barre, PharmD []  Loralee Pacas, PharmD []  Bernadene Person, PharmD   Positive urine culture Treated with Cephalexin, organism sensitive to the same and no further patient follow-up is required at this time.  Patsey Berthold 01/19/2023, 11:57 AM

## 2023-01-21 ENCOUNTER — Other Ambulatory Visit: Payer: Self-pay

## 2023-01-21 ENCOUNTER — Encounter (HOSPITAL_COMMUNITY): Payer: Self-pay | Admitting: *Deleted

## 2023-01-21 ENCOUNTER — Emergency Department (HOSPITAL_COMMUNITY)
Admission: EM | Admit: 2023-01-21 | Discharge: 2023-01-21 | Disposition: A | Discharge status: Home / Self Care | Payer: Managed Care, Other (non HMO) | Attending: Emergency Medicine | Admitting: Emergency Medicine

## 2023-01-21 ENCOUNTER — Emergency Department (HOSPITAL_COMMUNITY): Payer: Managed Care, Other (non HMO)

## 2023-01-21 DIAGNOSIS — M549 Dorsalgia, unspecified: Secondary | ICD-10-CM

## 2023-01-21 DIAGNOSIS — R109 Unspecified abdominal pain: Secondary | ICD-10-CM | POA: Insufficient documentation

## 2023-01-21 DIAGNOSIS — Z79899 Other long term (current) drug therapy: Secondary | ICD-10-CM | POA: Insufficient documentation

## 2023-01-21 DIAGNOSIS — M545 Low back pain, unspecified: Secondary | ICD-10-CM | POA: Insufficient documentation

## 2023-01-21 LAB — COMPREHENSIVE METABOLIC PANEL
ALT: 20 U/L (ref 0–44)
AST: 16 U/L (ref 15–41)
Albumin: 3.5 g/dL (ref 3.5–5.0)
Alkaline Phosphatase: 73 U/L (ref 38–126)
Anion gap: 8 (ref 5–15)
BUN: 22 mg/dL — ABNORMAL HIGH (ref 6–20)
CO2: 26 mmol/L (ref 22–32)
Calcium: 9.7 mg/dL (ref 8.9–10.3)
Chloride: 103 mmol/L (ref 98–111)
Creatinine, Ser: 0.96 mg/dL (ref 0.44–1.00)
GFR, Estimated: >60 mL/min (ref >60)
Glucose, Bld: 120 mg/dL — ABNORMAL HIGH (ref 70–99)
Potassium: 3.6 mmol/L (ref 3.5–5.1)
Sodium: 137 mmol/L (ref 135–145)
Total Bilirubin: 0.5 mg/dL (ref 0.3–1.2)
Total Protein: 6.6 g/dL (ref 6.5–8.1)

## 2023-01-21 LAB — CBC WITH DIFFERENTIAL/PLATELET
Abs Immature Granulocytes: 0.02 10*3/uL (ref 0.00–0.07)
Basophils Absolute: 0 10*3/uL (ref 0.0–0.1)
Basophils Relative: 0 %
Eosinophils Absolute: 0.1 10*3/uL (ref 0.0–0.5)
Eosinophils Relative: 1 %
HCT: 42.9 % (ref 36.0–46.0)
Hemoglobin: 14.3 g/dL (ref 12.0–15.0)
Immature Granulocytes: 0 %
Lymphocytes Relative: 25 %
Lymphs Abs: 1.5 10*3/uL (ref 0.7–4.0)
MCH: 29.8 pg (ref 26.0–34.0)
MCHC: 33.3 g/dL (ref 30.0–36.0)
MCV: 89.4 fL (ref 80.0–100.0)
Monocytes Absolute: 0.6 10*3/uL (ref 0.1–1.0)
Monocytes Relative: 9 %
Neutro Abs: 3.9 10*3/uL (ref 1.7–7.7)
Neutrophils Relative %: 65 %
Platelets: 202 10*3/uL (ref 150–400)
RBC: 4.8 MIL/uL (ref 3.87–5.11)
RDW: 13.4 % (ref 11.5–15.5)
WBC: 6.1 10*3/uL (ref 4.0–10.5)
nRBC: 0 % (ref 0.0–0.2)

## 2023-01-21 LAB — URINALYSIS, ROUTINE W REFLEX MICROSCOPIC
Bilirubin Urine: NEGATIVE
Glucose, UA: NEGATIVE mg/dL
Ketones, ur: NEGATIVE mg/dL
Nitrite: NEGATIVE
Protein, ur: NEGATIVE mg/dL
Specific Gravity, Urine: 1.01 (ref 1.005–1.030)
pH: 7 (ref 5.0–8.0)

## 2023-01-21 MED ORDER — MORPHINE SULFATE (PF) 4 MG/ML IV SOLN
4.0000 mg | Freq: Once | INTRAVENOUS | Status: AC
  Administered 2023-01-21: 4 mg via INTRAVENOUS
  Filled 2023-01-21: qty 1

## 2023-01-21 MED ORDER — METHOCARBAMOL 500 MG PO TABS: 500.0000 mg | ORAL_TABLET | Freq: Three times a day (TID) | ORAL | 0 refills | Status: DC | PRN

## 2023-01-21 MED ORDER — HYDROMORPHONE HCL 1 MG/ML IJ SOLN
0.5000 mg | Freq: Once | INTRAMUSCULAR | Status: AC
  Administered 2023-01-21: 0.5 mg via INTRAVENOUS
  Filled 2023-01-21: qty 1

## 2023-01-21 MED ORDER — IOHEXOL 300 MG/ML  SOLN
100.0000 mL | Freq: Once | INTRAMUSCULAR | Status: AC | PRN
  Administered 2023-01-21: 100 mL via INTRAVENOUS

## 2023-01-21 MED ORDER — CELECOXIB 200 MG PO CAPS: 200.0000 mg | ORAL_CAPSULE | Freq: Two times a day (BID) | ORAL | 0 refills | Status: DC

## 2023-01-21 NOTE — ED Triage Notes (Signed)
Seen last Wed for rt flank pain that continues.

## 2023-01-21 NOTE — ED Provider Notes (Signed)
[place] EMERGENCY DEPARTMENT AT [facility] Provider Note   CSN: [id number] Arrival date & time: 01/21/23  1134     History  Chief Complaint  Patient presents with   Flank Pain    Melanie Madden is a 57 y.o. female, history of multiple strokes, who presents to the ED secondary to continuous right-sided flank pain has been going over on for over a week.  She states that for the last week, she has had some right-sided flank pain, is constant, and she is getting worse.  Was seen in the ED about a week ago, prescribed Keflex, which she took, but has not had any improvement.  Denies any urinary frequency, urgency, dysuria, blood in urine, fevers, chills, abdominal pain.  Just states that her right flank continues to hurt.     Home Medications Prior to Admission medications   Medication Sig Start Date End Date Taking? Authorizing Provider  amLODipine (NORVASC) 10 MG tablet Take 10 mg by mouth daily.    [provider]  atorvastatin (LIPITOR) 20 MG tablet Take 1 tablet (20 mg total) by mouth daily at 6 PM. Patient taking differently: Take 20 mg by mouth daily. 08/20/19   Angiulli, Mcarthur Rossetti, PA-C  B Complex-C (B-COMPLEX WITH VITAMIN C) tablet Take 1 tablet by mouth daily.    [provider]  CALCIUM PO Take 1 tablet by mouth daily.    [provider]  cephALEXin (KEFLEX) 500 MG capsule Take 1 capsule (500 mg total) by mouth 4 (four) times daily. 01/16/23   Netta Corrigan, PA-C  Cholecalciferol (VITAMIN D) 50 MCG (2000 UT) CAPS Take 2,000 Units by mouth daily.    [provider]  ferrous sulfate 325 (65 FE) MG EC tablet Take 325 mg by mouth daily with breakfast.    [provider]  HYDROcodone-acetaminophen (NORCO) 5-325 MG tablet Take 1 tablet by mouth every 6 (six) hours as needed for up to 5 days for moderate pain. 01/16/23 01/21/23  Netta Corrigan, PA-C  magnesium 30 MG tablet Take 30 mg by mouth daily.    [provider]  Melatonin 10 MG CAPS Take 10 mg by mouth daily after supper. Take at sunset 01/08/22   Micki Riley, MD  METRONIDAZOLE, TOPICAL, 0.75 % LOTN Apply topically. 10/11/21   [provider]  Omega-3 Fatty Acids (FISH OIL PO) Take by mouth.    [provider]  ondansetron (ZOFRAN) 4 MG tablet Take 1 tablet (4 mg total) by mouth every 6 (six) hours. 01/16/23   Netta Corrigan, PA-C  zinc gluconate 50 MG tablet Take 50 mg by mouth daily.    [provider]      Allergies    Amoxicillin and Shellfish allergy    Review of Systems   Review of Systems  Constitutional:  Negative for fever.  Genitourinary:  Positive for flank pain.    Physical Exam Updated Vital Signs BP 136/86   Pulse 74   Temp 98.4 F (36.9 C) (Oral)   Resp 16   Ht 5\' 7"  (1.702 m)   Wt 63 kg   SpO2 98%   BMI 21.77 kg/m  Physical Exam Vitals and nursing note reviewed.  Constitutional:      General: She is not in acute distress.    Appearance: She is well-developed.  HENT:     Head: Normocephalic and atraumatic.  Eyes:     Conjunctiva/sclera: Conjunctivae normal.  Cardiovascular:  Rate and Rhythm: Normal rate and regular rhythm.     Heart sounds: No murmur heard. Pulmonary:     Effort: Pulmonary effort is normal. No respiratory distress.     Breath sounds: Normal breath sounds.  Abdominal:     Palpations: Abdomen is soft.     Tenderness: There is abdominal tenderness. There is right CVA tenderness.     Comments: +R flank pain  Musculoskeletal:        General: No swelling.     Cervical back: Neck supple.     Comments: Baseline weakness  Skin:    General: Skin is warm and dry.     Capillary Refill: Capillary refill takes less than 2 seconds.  Neurological:     Mental Status: She is alert.  Psychiatric:        Mood and Affect: Mood normal.     ED Results / Procedures / Treatments   Labs (all labs ordered are listed, but only abnormal results are  displayed) Labs Reviewed  URINALYSIS, ROUTINE W REFLEX MICROSCOPIC - Abnormal; Notable for the following components:      Result Value   APPearance HAZY (*)    Hgb urine dipstick Emil Weigold (*)    Leukocytes,Ua Romaldo Saville (*)    Bacteria, UA RARE (*)    All other components within normal limits  COMPREHENSIVE METABOLIC PANEL - Abnormal; Notable for the following components:   Glucose, Bld 120 (*)    BUN 22 (*)    All other components within normal limits  CBC WITH DIFFERENTIAL/PLATELET    EKG None  Radiology No results found.  Procedures Procedures    Medications Ordered in ED Medications  morphine (PF) 4 MG/ML injection 4 mg (4 mg Intravenous Given 01/21/23 1327)  HYDROmorphone (DILAUDID) injection 0.5 mg (0.5 mg Intravenous Given 01/21/23 1442)  iohexol (OMNIPAQUE) 300 MG/ML solution 100 mL (100 mLs Intravenous Contrast Given 01/21/23 1453)    ED Course/ Medical Decision Making/ A&P                             Medical Decision Making Patient is a 57 year old female, here for right-sided abdominal pain has been persistent for the last week.  It is more on the flank, and denies any urinary frequency urgency.  Was treated for UTI 5 days ago, without any relief.  She continues to have some right side pain.  Given that she had a UTI last week, we will obtain a urinalysis, CT on pelvis for further evaluation, she did have it without contrast so I will obtain it with to evaluate for any kind of perinephritic abscess or stranding.  Amount and/or Complexity of Data Reviewed Labs: ordered.    Details: Labs unremarkable, urine analysis improved, however still leukocytes, white blood cell count, and red blood cells Radiology: ordered. Discussion of management or test interpretation with external provider(s): Discussed with Abi, PA coming up again, pending CT abdomen pelvis, for disposition.  Just given another dose of pain medication, will require reeval.  Risk Prescription drug  management.    Final Clinical Impression(s) / ED Diagnoses Final diagnoses:  None    Rx / DC Orders ED Discharge Orders     None         Dolphus Jenny, Harley Alto, PA 01/21/23 1536    Bethann Berkshire, MD 01/22/23 1105

## 2023-01-21 NOTE — Discharge Instructions (Addendum)
SEEK IMMEDIATE MEDICAL ATTENTION IF: New numbness, tingling, weakness, or problem with the use of your arms or legs.  Severe back pain not relieved with medications.  Change in bowel or bladder control.  Increasing pain in any areas of the body (such as chest or abdominal pain).  Shortness of breath, dizziness or fainting.  Nausea (feeling sick to your stomach), vomiting, fever, or sweats.  

## 2023-03-26 ENCOUNTER — Emergency Department (HOSPITAL_COMMUNITY): Payer: Managed Care, Other (non HMO)

## 2023-03-26 ENCOUNTER — Telehealth: Payer: Self-pay

## 2023-03-26 ENCOUNTER — Encounter (HOSPITAL_COMMUNITY): Payer: Self-pay

## 2023-03-26 ENCOUNTER — Inpatient Hospital Stay (HOSPITAL_COMMUNITY)
Admission: EM | Admit: 2023-03-26 | Discharge: 2023-04-01 | DRG: 871 | Disposition: A | Discharge status: Home / Self Care | Payer: Managed Care, Other (non HMO) | Attending: Internal Medicine | Admitting: Internal Medicine

## 2023-03-26 ENCOUNTER — Other Ambulatory Visit: Payer: Self-pay

## 2023-03-26 DIAGNOSIS — A419 Sepsis, unspecified organism: Secondary | ICD-10-CM | POA: Diagnosis present

## 2023-03-26 DIAGNOSIS — R579 Shock, unspecified: Secondary | ICD-10-CM | POA: Diagnosis not present

## 2023-03-26 DIAGNOSIS — E872 Acidosis, unspecified: Secondary | ICD-10-CM | POA: Diagnosis present

## 2023-03-26 DIAGNOSIS — Z8349 Family history of other endocrine, nutritional and metabolic diseases: Secondary | ICD-10-CM

## 2023-03-26 DIAGNOSIS — R7881 Bacteremia: Secondary | ICD-10-CM | POA: Diagnosis present

## 2023-03-26 DIAGNOSIS — Z1152 Encounter for screening for COVID-19: Secondary | ICD-10-CM

## 2023-03-26 DIAGNOSIS — Z820 Family history of epilepsy and other diseases of the nervous system: Secondary | ICD-10-CM

## 2023-03-26 DIAGNOSIS — N136 Pyonephrosis: Secondary | ICD-10-CM | POA: Diagnosis present

## 2023-03-26 DIAGNOSIS — Q6211 Congenital occlusion of ureteropelvic junction: Secondary | ICD-10-CM

## 2023-03-26 DIAGNOSIS — R739 Hyperglycemia, unspecified: Secondary | ICD-10-CM | POA: Diagnosis present

## 2023-03-26 DIAGNOSIS — Z880 Allergy status to penicillin: Secondary | ICD-10-CM

## 2023-03-26 DIAGNOSIS — F419 Anxiety disorder, unspecified: Secondary | ICD-10-CM | POA: Diagnosis present

## 2023-03-26 DIAGNOSIS — Z79899 Other long term (current) drug therapy: Secondary | ICD-10-CM

## 2023-03-26 DIAGNOSIS — A4151 Sepsis due to Escherichia coli [E. coli]: Secondary | ICD-10-CM | POA: Diagnosis not present

## 2023-03-26 DIAGNOSIS — Z90722 Acquired absence of ovaries, bilateral: Secondary | ICD-10-CM

## 2023-03-26 DIAGNOSIS — Z9079 Acquired absence of other genital organ(s): Secondary | ICD-10-CM

## 2023-03-26 DIAGNOSIS — I69251 Hemiplegia and hemiparesis following other nontraumatic intracranial hemorrhage affecting right dominant side: Secondary | ICD-10-CM

## 2023-03-26 DIAGNOSIS — Z87442 Personal history of urinary calculi: Secondary | ICD-10-CM

## 2023-03-26 DIAGNOSIS — Z833 Family history of diabetes mellitus: Secondary | ICD-10-CM

## 2023-03-26 DIAGNOSIS — N201 Calculus of ureter: Secondary | ICD-10-CM | POA: Diagnosis present

## 2023-03-26 DIAGNOSIS — E871 Hypo-osmolality and hyponatremia: Secondary | ICD-10-CM | POA: Diagnosis present

## 2023-03-26 DIAGNOSIS — R4701 Aphasia: Secondary | ICD-10-CM | POA: Diagnosis present

## 2023-03-26 DIAGNOSIS — Z8249 Family history of ischemic heart disease and other diseases of the circulatory system: Secondary | ICD-10-CM

## 2023-03-26 DIAGNOSIS — F32A Depression, unspecified: Secondary | ICD-10-CM | POA: Diagnosis present

## 2023-03-26 DIAGNOSIS — E876 Hypokalemia: Secondary | ICD-10-CM | POA: Diagnosis present

## 2023-03-26 DIAGNOSIS — I69220 Aphasia following other nontraumatic intracranial hemorrhage: Secondary | ICD-10-CM

## 2023-03-26 DIAGNOSIS — E781 Pure hyperglyceridemia: Secondary | ICD-10-CM | POA: Diagnosis present

## 2023-03-26 DIAGNOSIS — I10 Essential (primary) hypertension: Secondary | ICD-10-CM | POA: Diagnosis present

## 2023-03-26 DIAGNOSIS — N1330 Unspecified hydronephrosis: Secondary | ICD-10-CM | POA: Diagnosis present

## 2023-03-26 DIAGNOSIS — Z91013 Allergy to seafood: Secondary | ICD-10-CM

## 2023-03-26 DIAGNOSIS — I639 Cerebral infarction, unspecified: Secondary | ICD-10-CM | POA: Diagnosis present

## 2023-03-26 DIAGNOSIS — Z85038 Personal history of other malignant neoplasm of large intestine: Secondary | ICD-10-CM

## 2023-03-26 DIAGNOSIS — R6521 Severe sepsis with septic shock: Secondary | ICD-10-CM | POA: Diagnosis present

## 2023-03-26 DIAGNOSIS — N179 Acute kidney failure, unspecified: Secondary | ICD-10-CM | POA: Diagnosis present

## 2023-03-26 DIAGNOSIS — E860 Dehydration: Secondary | ICD-10-CM | POA: Diagnosis present

## 2023-03-26 DIAGNOSIS — Z87891 Personal history of nicotine dependence: Secondary | ICD-10-CM

## 2023-03-26 DIAGNOSIS — Z90710 Acquired absence of both cervix and uterus: Secondary | ICD-10-CM

## 2023-03-26 LAB — CBC WITH DIFFERENTIAL/PLATELET
Abs Immature Granulocytes: 0.03 10*3/uL (ref 0.00–0.07)
Basophils Absolute: 0 10*3/uL (ref 0.0–0.1)
Basophils Relative: 0 %
Eosinophils Absolute: 0 10*3/uL (ref 0.0–0.5)
Eosinophils Relative: 0 %
HCT: 40.8 % (ref 36.0–46.0)
Hemoglobin: 13.4 g/dL (ref 12.0–15.0)
Immature Granulocytes: 0 %
Lymphocytes Relative: 3 %
Lymphs Abs: 0.3 10*3/uL — ABNORMAL LOW (ref 0.7–4.0)
MCH: 28.5 pg (ref 26.0–34.0)
MCHC: 32.8 g/dL (ref 30.0–36.0)
MCV: 86.6 fL (ref 80.0–100.0)
Monocytes Absolute: 0.1 10*3/uL (ref 0.1–1.0)
Monocytes Relative: 1 %
Neutro Abs: 7.3 10*3/uL (ref 1.7–7.7)
Neutrophils Relative %: 96 %
Platelets: 254 10*3/uL (ref 150–400)
RBC: 4.71 MIL/uL (ref 3.87–5.11)
RDW: 14.5 % (ref 11.5–15.5)
WBC: 7.7 10*3/uL (ref 4.0–10.5)
nRBC: 0 % (ref 0.0–0.2)

## 2023-03-26 LAB — BASIC METABOLIC PANEL
Anion gap: 21 — ABNORMAL HIGH (ref 5–15)
BUN: 30 mg/dL — ABNORMAL HIGH (ref 6–20)
CO2: 15 mmol/L — ABNORMAL LOW (ref 22–32)
Calcium: 10.1 mg/dL (ref 8.9–10.3)
Chloride: 97 mmol/L — ABNORMAL LOW (ref 98–111)
Creatinine, Ser: 2.18 mg/dL — ABNORMAL HIGH (ref 0.44–1.00)
GFR, Estimated: 26 mL/min — ABNORMAL LOW (ref >60)
Glucose, Bld: 217 mg/dL — ABNORMAL HIGH (ref 70–99)
Potassium: 3 mmol/L — ABNORMAL LOW (ref 3.5–5.1)
Sodium: 133 mmol/L — ABNORMAL LOW (ref 135–145)

## 2023-03-26 LAB — I-STAT CG4 LACTIC ACID, ED: Lactic Acid, Venous: 5.3 mmol/L (ref 0.5–1.9)

## 2023-03-26 MED ORDER — LACTATED RINGERS IV SOLN: INTRAVENOUS | Status: AC

## 2023-03-26 MED ORDER — SODIUM CHLORIDE 0.9 % IV SOLN
2.0000 g | INTRAVENOUS | Status: DC
  Administered 2023-03-26 – 2023-03-29 (×4): 2 g via INTRAVENOUS
  Filled 2023-03-26 (×4): qty 20

## 2023-03-26 MED ORDER — LACTATED RINGERS IV BOLUS (SEPSIS)
1000.0000 mL | Freq: Once | INTRAVENOUS | Status: AC
  Administered 2023-03-26: 1000 mL via INTRAVENOUS

## 2023-03-26 MED ORDER — LACTATED RINGERS IV BOLUS (SEPSIS)
1000.0000 mL | Freq: Once | INTRAVENOUS | Status: AC
  Administered 2023-03-27: 1000 mL via INTRAVENOUS

## 2023-03-26 NOTE — ED Notes (Signed)
Patient assisted to restroom to provide urine sample, patient defecated in urine container. Family aware that specimen is needed.

## 2023-03-26 NOTE — ED Provider Triage Note (Signed)
Emergency Medicine Provider Triage Evaluation Note  Melanie Madden , a 57 y.o. female  was evaluated in triage.  Pt complains of right ankle pain and swelling. She is very difficult to communicate with because of a history of aphasia from a stroke.  She has chronic right-sided weakness and spends all of her time in a brace.  At evening time she uses a soft brace to prevent skin breakdown but the husband has noted that she is dragging her foot when she uses the soft brace.  He is worried about an ankle injury because of this. She has obvious swelling over the dorsum of the right foot. The husband interrupts that she is also been complaining about ongoing abdominal pain and had a recent urinary tract infection that he feels the symptoms did not improve from..  Review of Systems  Positive: Abdominal pain, foot pain, foot swelling Negative: Fevers chills nausea vomiting mental status change  Physical Exam  BP 129/66 (BP Location: Left Arm)   Pulse (!) 166   Temp 98.4 F (36.9 C)   Resp 19   SpO2 99%  Gen:   Awake, no distress   Resp:  Normal effort  MSK:   Moves extremities without difficulty  Other:    Medical Decision Making  Medically screening exam initiated at 8:32 PM.  Appropriate orders placed.  Alan Ripper Paiva Dasilva-Melo was informed that the remainder of the evaluation will be completed by another provider, this initial triage assessment does not replace that evaluation, and the importance of remaining in the ED until their evaluation is complete.     Glyn Ade, MD 03/26/23 2033

## 2023-03-26 NOTE — Sepsis Progress Note (Signed)
Elink monitoring for the code sepsis protocol.  

## 2023-03-26 NOTE — Telephone Encounter (Signed)
The spouse of the patient contacted [place] to report that the patient is experiencing worsening lower abdominal pain over the past week. Additionally, the patient has right-sided weakness. The last bowel movement occurred today, and the patient has taken Tylenol without experiencing any relief. The spouse is requesting that the patient be seen by a provider for further evaluation.

## 2023-03-26 NOTE — ED Triage Notes (Signed)
Patient BIB [facility] from home for right foot pain and swelling after she hit it on something. Patient has a hx of stroke and has right sided deficits and aphasia. Patient anxious on scene and was screaming with spouse and fire, calm and cooperative at this time. HR 130, BP 134/96, 98% O2, only able to communicate minimally d/t aphasia.

## 2023-03-26 NOTE — ED Provider Notes (Signed)
Melanie Madden   CSN: [id number] Arrival date & time: 03/26/23  2003     History {Add pertinent medical, surgical, social history, OB history to HPI:1} Chief Complaint  Patient presents with   sepsis   Level 5 caveat due to acuity of condition Melanie Madden is a 57 y.o. female.  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 coli bacteremia presents with abrupt onset of chills  Most of the history is provided by the husband.  He reports she has been at her baseline but several hours ago reported feeling chills and generalized weakness.  No recent fevers or vomiting or diarrhea.  No recent falls in the past week.  Patient is been compliant with her medications.  Patient does have significant history of septic shock and required nephrostomy tubes previously Initial complaint was foot pain, but has reports this issue is improving  Past Medical History:  Diagnosis Date   Allergy    Anxiety    Depression    Expressive aphasia    Hemiparesis (HCC)    Right side   High triglycerides    History of kidney stones    Kidney stones    Recurrent sinus infections    Stroke [facility])     Home Medications Prior to Admission medications   Medication Sig Start Date End Date Taking? Authorizing Provider  amLODipine (NORVASC) 10 MG tablet Take 10 mg by mouth daily.   Yes [provider]  atorvastatin (LIPITOR) 20 MG tablet Take 1 tablet (20 mg total) by mouth daily at 6 PM. Patient taking differently: Take 20 mg by mouth daily. 08/20/19  Yes Angiulli, Mcarthur Rossetti, PA-C  B Complex-C (B-COMPLEX WITH VITAMIN C) tablet Take 1 tablet by mouth daily.   Yes [provider]  Bisacodyl ([employer] C-LAX LAXATIVE PO) Take by mouth as needed (constiipation).   Yes [provider]  CALCIUM PO Take 1 tablet by mouth daily.    Yes [provider]  Cholecalciferol (VITAMIN D) 50 MCG (2000 UT) CAPS Take 2,000 Units by mouth daily.   Yes [provider]  ferrous sulfate 325 (65 FE) MG EC tablet Take 325 mg by mouth daily with breakfast.   Yes [provider]  gabapentin (NEURONTIN) 100 MG capsule 100 mg at bedtime. 02/27/23  Yes [provider]  magnesium 30 MG tablet Take 30 mg by mouth daily.   Yes [provider]  Omega-3 Fatty Acids (FISH OIL PO) Take by mouth.   Yes [provider]  VITAMIN E PO Take 1 tablet by mouth daily.   Yes [provider]  zinc gluconate 50 MG tablet Take 50 mg by mouth daily.   Yes [provider]      Allergies    Amoxicillin and Shellfish allergy    Review of Systems   Review of Systems  Unable to perform ROS: Acuity of condition    Physical Exam Updated Vital Signs BP (!) 76/54   Pulse (!) 112   Temp (!) 97.5 F (36.4 C)   Resp (!) 33   SpO2 98%  Physical Exam CONSTITUTIONAL: Well developed/well nourished, no acute distress HEAD: Normocephalic/atraumatic EYES: EOMI/PERRL ENMT: Mucous membranes moist NECK: supple no meningeal signs SPINE/BACK:entire spine nontender CV: S1/S2 noted, no murmurs/rubs/gallops noted LUNGS: Lungs are clear to auscultation bilaterally, no apparent distress ABDOMEN: soft, mild diffuse tenderness  GU:no cva tenderness NEURO: Pt is awake/alert patient has expressive aphasia at baseline.  Dense right hemiparesis is noted EXTREMITIES: pulses normal/equal, full ROM No deformities.  Right foot drop noted that appears chronic SKIN: Skin is cool to touch  ED Results / Procedures / Treatments   Labs (all labs ordered are listed, but only abnormal results are displayed) Labs Reviewed  CBC WITH DIFFERENTIAL/PLATELET - Abnormal; Notable for the following components:      Result Value   Lymphs Abs 0.3 (*)    All other components within normal limits  BASIC METABOLIC PANEL -  Abnormal; Notable for the following components:   Sodium 133 (*)    Potassium 3.0 (*)    Chloride 97 (*)    CO2 15 (*)    Glucose, Bld 217 (*)    BUN 30 (*)    Creatinine, Ser 2.18 (*)    GFR, Estimated 26 (*)    Anion gap 21 (*)    All other components within normal limits  I-STAT CG4 LACTIC ACID, ED - Abnormal; Notable for the following components:   Lactic Acid, Venous 5.3 (*)    All other components within normal limits  RESP PANEL BY RT-PCR (RSV, FLU A&B, COVID)  RVPGX2  CULTURE, BLOOD (ROUTINE X 2)  CULTURE, BLOOD (ROUTINE X 2)  URINE CULTURE  URINALYSIS, ROUTINE W REFLEX MICROSCOPIC  PROTIME-INR  APTT    EKG EKG Interpretation Date/Time:  Tuesday March 26 2023 23:26:51 EDT Ventricular Rate:  115 PR Interval:  107 QRS Duration:  92 QT Interval:  316 QTC Calculation: 437 R Axis:   27  Text Interpretation: Sinus tachycardia Confirmed by Zadie Rhine ([id number]) on 03/26/2023 11:49:33 PM  Radiology DG Foot Complete Right  Result Date: 03/26/2023 CLINICAL DATA:  Right foot and ankle swelling, initial encounter EXAM: RIGHT FOOT COMPLETE - 3+ VIEW COMPARISON:  None Available. FINDINGS: Mild hallux valgus deformity is noted. Deformity in the distal first metatarsal is noted likely related to prior surgery. No acute fracture or dislocation is noted. No soft tissue abnormality is seen. IMPRESSION: Chronic changes without acute abnormality. Electronically Signed   By: Alcide Clever M.D.   On: 03/26/2023 21:01    Procedures .Critical Care  Performed by: Zadie Rhine, MD Authorized by: Zadie Rhine, MD   Critical care provider statement:    Critical care start time:  03/27/2023 12:01 AM   Critical care end time:  03/27/2023 12:01 AM   Critical care time was exclusive of:  Separately billable procedures and treating other patients   Critical care was necessary to treat or prevent imminent or life-threatening deterioration of the following conditions:  Sepsis,  dehydration and metabolic crisis   Critical care was time spent personally by me on the following activities:  Obtaining history from patient or surrogate, examination of patient, evaluation of patient's response to treatment, development of treatment plan with patient or surrogate, pulse oximetry, ordering and review of radiographic studies, ordering and review of laboratory studies, ordering and performing treatments and interventions, re-evaluation of patient's condition and review of old charts   I assumed direction of critical care for this patient from another provider in my specialty: no     Care discussed with: admitting provider       Medications Ordered in ED Medications  lactated ringers infusion (has no administration in time range)  lactated ringers bolus 1,000 mL (1,000 mLs Intravenous New Bag/Given 03/26/23 2354)    And  lactated ringers bolus 1,000 mL (has no  administration in time range)  cefTRIAXone (ROCEPHIN) 2 g in sodium chloride 0.9 % 100 mL IVPB (2 g Intravenous New Bag/Given 03/26/23 2354)    ED Course/ Medical Decision Making/ A&P   {   Click here for ABCD2, HEART and other calculatorsREFRESH Madden before signing :1}                              Medical Decision Making Amount and/or Complexity of Data Reviewed Labs: ordered. Radiology: ordered.  Risk Prescription drug management. Decision regarding hospitalization.   This patient presents to the ED for concern of weakness and chills, this involves an extensive number of treatment options, and is a complaint that carries with it a high risk of complications and morbidity.  The differential diagnosis includes but is not limited to CVA, intracranial hemorrhage, acute coronary syndrome, renal failure, urinary tract infection, electrolyte disturbance, pneumonia Sepsis  Comorbidities that complicate the patient evaluation: Patient's presentation is complicated by their history of CVA, previous septic shock  Social  Determinants of Health: Patient's  expressive aphasia   increases the complexity of managing their presentation  Additional history obtained: Additional history obtained from spouse Records reviewed previous admission documents  Lab Tests: I Ordered, and personally interpreted labs.  The pertinent results include: Acute kidney injury  Imaging Studies ordered: I ordered imaging studies including CT scan renal and X-ray chest   I independently visualized and interpreted imaging which showed *** I agree with the radiologist interpretation  Cardiac Monitoring: The patient was maintained on a cardiac monitor.  I personally viewed and interpreted the cardiac monitor which showed an underlying rhythm of:  sinus tachycardia  Medicines ordered and prescription drug management: I ordered medication including IV fluids for hypotension Reevaluation of the patient after these medicines showed that the patient    {resolved/improved/worsened:23923::"improved"}   Critical Interventions:   IV fluids and antibiotics  Consultations Obtained: I requested consultation with the {consultation:[id number]}, and discussed  findings as well as pertinent plan - they recommend: ***  Reevaluation: After the interventions noted above, I reevaluated the patient and found that they have :{resolved/improved/worsened:23923::"improved"}  Complexity of problems addressed: Patient's presentation is most consistent with  acute presentation with potential threat to life or bodily function  Disposition: After consideration of the diagnostic results and the patient's response to treatment,  I feel that the patent would benefit from admission   .     {Document critical care time when appropriate:1} {Document review of labs and clinical decision tools ie heart score, Chads2Vasc2 etc:1}  {Document your independent review of radiology images, and any outside records:1} {Document your discussion with family members,  caretakers, and with consultants:1} {Document social determinants of health affecting pt's care:1} {Document your decision making why or why not admission, treatments were needed:1} Final Clinical Impression(s) / ED Diagnoses Final diagnoses:  None    Rx / DC Orders ED Discharge Orders     None

## 2023-03-27 ENCOUNTER — Emergency Department (HOSPITAL_COMMUNITY): Payer: Managed Care, Other (non HMO)

## 2023-03-27 ENCOUNTER — Inpatient Hospital Stay (HOSPITAL_COMMUNITY): Payer: Managed Care, Other (non HMO)

## 2023-03-27 ENCOUNTER — Encounter (HOSPITAL_COMMUNITY): Payer: Self-pay | Admitting: Pulmonary Disease

## 2023-03-27 ENCOUNTER — Ambulatory Visit: Payer: Managed Care, Other (non HMO) | Admitting: Oncology

## 2023-03-27 DIAGNOSIS — Z820 Family history of epilepsy and other diseases of the nervous system: Secondary | ICD-10-CM | POA: Diagnosis not present

## 2023-03-27 DIAGNOSIS — A419 Sepsis, unspecified organism: Secondary | ICD-10-CM

## 2023-03-27 DIAGNOSIS — Z85038 Personal history of other malignant neoplasm of large intestine: Secondary | ICD-10-CM | POA: Diagnosis not present

## 2023-03-27 DIAGNOSIS — A4151 Sepsis due to Escherichia coli [E. coli]: Secondary | ICD-10-CM | POA: Diagnosis present

## 2023-03-27 DIAGNOSIS — Z880 Allergy status to penicillin: Secondary | ICD-10-CM | POA: Diagnosis not present

## 2023-03-27 DIAGNOSIS — Z833 Family history of diabetes mellitus: Secondary | ICD-10-CM | POA: Diagnosis not present

## 2023-03-27 DIAGNOSIS — E871 Hypo-osmolality and hyponatremia: Secondary | ICD-10-CM | POA: Diagnosis present

## 2023-03-27 DIAGNOSIS — R579 Shock, unspecified: Secondary | ICD-10-CM | POA: Diagnosis present

## 2023-03-27 DIAGNOSIS — Z79899 Other long term (current) drug therapy: Secondary | ICD-10-CM | POA: Diagnosis not present

## 2023-03-27 DIAGNOSIS — Z8249 Family history of ischemic heart disease and other diseases of the circulatory system: Secondary | ICD-10-CM | POA: Diagnosis not present

## 2023-03-27 DIAGNOSIS — Z1152 Encounter for screening for COVID-19: Secondary | ICD-10-CM | POA: Diagnosis not present

## 2023-03-27 DIAGNOSIS — Z9079 Acquired absence of other genital organ(s): Secondary | ICD-10-CM | POA: Diagnosis not present

## 2023-03-27 DIAGNOSIS — Z91013 Allergy to seafood: Secondary | ICD-10-CM | POA: Diagnosis not present

## 2023-03-27 DIAGNOSIS — N136 Pyonephrosis: Secondary | ICD-10-CM | POA: Diagnosis present

## 2023-03-27 DIAGNOSIS — E781 Pure hyperglyceridemia: Secondary | ICD-10-CM | POA: Diagnosis present

## 2023-03-27 DIAGNOSIS — Z87891 Personal history of nicotine dependence: Secondary | ICD-10-CM | POA: Diagnosis not present

## 2023-03-27 DIAGNOSIS — N132 Hydronephrosis with renal and ureteral calculous obstruction: Secondary | ICD-10-CM | POA: Diagnosis not present

## 2023-03-27 DIAGNOSIS — R6521 Severe sepsis with septic shock: Secondary | ICD-10-CM | POA: Diagnosis present

## 2023-03-27 DIAGNOSIS — N179 Acute kidney failure, unspecified: Secondary | ICD-10-CM | POA: Diagnosis present

## 2023-03-27 DIAGNOSIS — I69220 Aphasia following other nontraumatic intracranial hemorrhage: Secondary | ICD-10-CM | POA: Diagnosis not present

## 2023-03-27 DIAGNOSIS — I69251 Hemiplegia and hemiparesis following other nontraumatic intracranial hemorrhage affecting right dominant side: Secondary | ICD-10-CM | POA: Diagnosis not present

## 2023-03-27 DIAGNOSIS — Q6211 Congenital occlusion of ureteropelvic junction: Secondary | ICD-10-CM | POA: Diagnosis not present

## 2023-03-27 DIAGNOSIS — E876 Hypokalemia: Secondary | ICD-10-CM | POA: Diagnosis present

## 2023-03-27 DIAGNOSIS — F32A Depression, unspecified: Secondary | ICD-10-CM | POA: Diagnosis present

## 2023-03-27 DIAGNOSIS — I10 Essential (primary) hypertension: Secondary | ICD-10-CM | POA: Diagnosis present

## 2023-03-27 DIAGNOSIS — E872 Acidosis, unspecified: Secondary | ICD-10-CM | POA: Diagnosis present

## 2023-03-27 DIAGNOSIS — Z90722 Acquired absence of ovaries, bilateral: Secondary | ICD-10-CM | POA: Diagnosis not present

## 2023-03-27 DIAGNOSIS — Z90710 Acquired absence of both cervix and uterus: Secondary | ICD-10-CM | POA: Diagnosis not present

## 2023-03-27 HISTORY — PX: IR NEPHROSTOMY PLACEMENT RIGHT: IMG6064

## 2023-03-27 LAB — BLOOD CULTURE ID PANEL (REFLEXED) - BCID2

## 2023-03-27 LAB — BASIC METABOLIC PANEL
Anion gap: 12 (ref 5–15)
BUN: 29 mg/dL — ABNORMAL HIGH (ref 6–20)
CO2: 18 mmol/L — ABNORMAL LOW (ref 22–32)
Calcium: 8.7 mg/dL — ABNORMAL LOW (ref 8.9–10.3)
Chloride: 104 mmol/L (ref 98–111)
Creatinine, Ser: 1.9 mg/dL — ABNORMAL HIGH (ref 0.44–1.00)
GFR, Estimated: 30 mL/min — ABNORMAL LOW (ref >60)
Glucose, Bld: 234 mg/dL — ABNORMAL HIGH (ref 70–99)
Potassium: 2.9 mmol/L — ABNORMAL LOW (ref 3.5–5.1)
Sodium: 134 mmol/L — ABNORMAL LOW (ref 135–145)

## 2023-03-27 LAB — RESP PANEL BY RT-PCR (RSV, FLU A&B, COVID)  RVPGX2
Influenza A by PCR: NEGATIVE
Influenza B by PCR: NEGATIVE
Resp Syncytial Virus by PCR: NEGATIVE
SARS Coronavirus 2 by RT PCR: NEGATIVE

## 2023-03-27 LAB — URINALYSIS, ROUTINE W REFLEX MICROSCOPIC
Bilirubin Urine: NEGATIVE
Glucose, UA: NEGATIVE mg/dL
Ketones, ur: NEGATIVE mg/dL
Nitrite: NEGATIVE
Protein, ur: 100 mg/dL — AB
RBC / HPF: >50 RBC/hpf (ref 0–5)
Specific Gravity, Urine: 1.011 (ref 1.005–1.030)
WBC, UA: >50 WBC/hpf (ref 0–5)
pH: 5 (ref 5.0–8.0)

## 2023-03-27 LAB — GLUCOSE, CAPILLARY
Glucose-Capillary: 212 mg/dL — ABNORMAL HIGH (ref 70–99)
Glucose-Capillary: 230 mg/dL — ABNORMAL HIGH (ref 70–99)
Glucose-Capillary: 193 mg/dL — ABNORMAL HIGH (ref 70–99)
Glucose-Capillary: 196 mg/dL — ABNORMAL HIGH (ref 70–99)
Glucose-Capillary: 181 mg/dL — ABNORMAL HIGH (ref 70–99)
Glucose-Capillary: 149 mg/dL — ABNORMAL HIGH (ref 70–99)

## 2023-03-27 LAB — CBC
HCT: 30.2 % — ABNORMAL LOW (ref 36.0–46.0)
Hemoglobin: 10.3 g/dL — ABNORMAL LOW (ref 12.0–15.0)
MCH: 29.8 pg (ref 26.0–34.0)
MCHC: 34.1 g/dL (ref 30.0–36.0)
MCV: 87.3 fL (ref 80.0–100.0)
Platelets: 230 10*3/uL (ref 150–400)
RBC: 3.46 MIL/uL — ABNORMAL LOW (ref 3.87–5.11)
RDW: 14.8 % (ref 11.5–15.5)
WBC: 43.3 10*3/uL — ABNORMAL HIGH (ref 4.0–10.5)
nRBC: 0 % (ref 0.0–0.2)

## 2023-03-27 LAB — LACTIC ACID, PLASMA
Lactic Acid, Venous: 3.7 mmol/L (ref 0.5–1.9)
Lactic Acid, Venous: 3.3 mmol/L (ref 0.5–1.9)
Lactic Acid, Venous: 2.8 mmol/L (ref 0.5–1.9)

## 2023-03-27 LAB — MRSA NEXT GEN BY PCR, NASAL: MRSA by PCR Next Gen: NOT DETECTED

## 2023-03-27 LAB — PHOSPHORUS: Phosphorus: 3.2 mg/dL (ref 2.5–4.6)

## 2023-03-27 LAB — PROTIME-INR
INR: 1.2 (ref 0.8–1.2)
Prothrombin Time: 15.7 s — ABNORMAL HIGH (ref 11.4–15.2)

## 2023-03-27 LAB — APTT: aPTT: 30 s (ref 24–36)

## 2023-03-27 LAB — HEMOGLOBIN A1C
Hgb A1c MFr Bld: 5.6 % (ref 4.8–5.6)
Mean Plasma Glucose: 114.02 mg/dL

## 2023-03-27 LAB — MAGNESIUM: Magnesium: 1.7 mg/dL (ref 1.7–2.4)

## 2023-03-27 MED ORDER — MIDAZOLAM HCL 2 MG/2ML IJ SOLN
INTRAMUSCULAR | Status: AC | PRN
  Administered 2023-03-27: 1 mg via INTRAVENOUS

## 2023-03-27 MED ORDER — HYALURONIDASE HUMAN 150 UNIT/ML IJ SOLN
150.0000 [IU] | Freq: Once | INTRAMUSCULAR | Status: AC
  Administered 2023-03-27: 150 [IU] via SUBCUTANEOUS
  Filled 2023-03-27: qty 1

## 2023-03-27 MED ORDER — GABAPENTIN 100 MG PO CAPS
100.0000 mg | ORAL_CAPSULE | Freq: Every day | ORAL | Status: DC
  Administered 2023-03-27 – 2023-03-29 (×3): 100 mg via ORAL
  Filled 2023-03-27 (×3): qty 1

## 2023-03-27 MED ORDER — DOCUSATE SODIUM 100 MG PO CAPS: 100.0000 mg | ORAL_CAPSULE | Freq: Two times a day (BID) | ORAL | Status: DC | PRN

## 2023-03-27 MED ORDER — POLYETHYLENE GLYCOL 3350 17 G PO PACK: 17.0000 g | PACK | Freq: Every day | ORAL | Status: DC | PRN

## 2023-03-27 MED ORDER — CHLORHEXIDINE GLUCONATE CLOTH 2 % EX PADS: 6.0000 | MEDICATED_PAD | Freq: Every day | CUTANEOUS | Status: DC

## 2023-03-27 MED ORDER — LIDOCAINE HCL 1 % IJ SOLN
20.0000 mL | Freq: Once | INTRAMUSCULAR | Status: AC
  Administered 2023-03-27: 10 mL

## 2023-03-27 MED ORDER — FENTANYL CITRATE (PF) 100 MCG/2ML IJ SOLN
INTRAMUSCULAR | Status: AC
  Filled 2023-03-27: qty 2

## 2023-03-27 MED ORDER — NOREPINEPHRINE 4 MG/250ML-% IV SOLN
2.0000 ug/min | INTRAVENOUS | Status: DC
  Administered 2023-03-27: 10 ug/min via INTRAVENOUS
  Administered 2023-03-27: 2 ug/min via INTRAVENOUS
  Administered 2023-03-28: 4 ug/min via INTRAVENOUS
  Filled 2023-03-27 (×3): qty 250

## 2023-03-27 MED ORDER — ONDANSETRON HCL 4 MG/2ML IJ SOLN: 4.0000 mg | Freq: Four times a day (QID) | INTRAMUSCULAR | Status: DC | PRN

## 2023-03-27 MED ORDER — FENTANYL CITRATE (PF) 100 MCG/2ML IJ SOLN
INTRAMUSCULAR | Status: AC | PRN
Start: 2023-03-27 — End: 2023-03-27
  Administered 2023-03-27: 25 ug via INTRAVENOUS

## 2023-03-27 MED ORDER — SODIUM CHLORIDE 0.9 % IV SOLN: 250.0000 mL | INTRAVENOUS | Status: DC

## 2023-03-27 MED ORDER — SODIUM CHLORIDE 0.9 % IV SOLN: INTRAVENOUS | Status: DC

## 2023-03-27 MED ORDER — POTASSIUM CHLORIDE 10 MEQ/100ML IV SOLN
10.0000 meq | INTRAVENOUS | Status: AC
  Administered 2023-03-27 (×8): 10 meq via INTRAVENOUS
  Filled 2023-03-27 (×8): qty 100

## 2023-03-27 MED ORDER — INSULIN ASPART 100 UNIT/ML IJ SOLN: 0.0000 [IU] | Freq: Every day | INTRAMUSCULAR | Status: DC

## 2023-03-27 MED ORDER — MIDAZOLAM HCL 2 MG/2ML IJ SOLN
INTRAMUSCULAR | Status: AC
  Filled 2023-03-27: qty 2

## 2023-03-27 MED ORDER — INSULIN ASPART 100 UNIT/ML IJ SOLN
0.0000 [IU] | Freq: Three times a day (TID) | INTRAMUSCULAR | Status: DC
  Administered 2023-03-27 – 2023-03-28 (×4): 4 [IU] via SUBCUTANEOUS
  Administered 2023-03-29 – 2023-03-30 (×2): 7 [IU] via SUBCUTANEOUS
  Administered 2023-03-31 (×2): 3 [IU] via SUBCUTANEOUS

## 2023-03-27 MED ORDER — IOHEXOL 300 MG/ML  SOLN
50.0000 mL | Freq: Once | INTRAMUSCULAR | Status: AC | PRN
  Administered 2023-03-27: 2 mL

## 2023-03-27 MED ORDER — HEPARIN SODIUM (PORCINE) 5000 UNIT/ML IJ SOLN
5000.0000 [IU] | Freq: Three times a day (TID) | INTRAMUSCULAR | Status: DC
  Administered 2023-03-27 – 2023-04-01 (×10): 5000 [IU] via SUBCUTANEOUS
  Filled 2023-03-27 (×11): qty 1

## 2023-03-27 MED ORDER — INSULIN ASPART 100 UNIT/ML IJ SOLN
0.0000 [IU] | INTRAMUSCULAR | Status: DC
  Administered 2023-03-27 (×2): 5 [IU] via SUBCUTANEOUS

## 2023-03-27 MED ORDER — CHLORHEXIDINE GLUCONATE CLOTH 2 % EX PADS
6.0000 | MEDICATED_PAD | Freq: Every day | CUTANEOUS | Status: DC
  Administered 2023-03-27 – 2023-03-30 (×4): 6 via TOPICAL

## 2023-03-27 MED ORDER — LACTATED RINGERS IV BOLUS
1000.0000 mL | Freq: Once | INTRAVENOUS | Status: AC
  Administered 2023-03-27: 1000 mL via INTRAVENOUS

## 2023-03-27 MED ORDER — MAGNESIUM SULFATE 2 GM/50ML IV SOLN
2.0000 g | Freq: Once | INTRAVENOUS | Status: AC
  Administered 2023-03-27: 2 g via INTRAVENOUS
  Filled 2023-03-27: qty 50

## 2023-03-27 MED ORDER — LACTATED RINGERS IV BOLUS
500.0000 mL | Freq: Once | INTRAVENOUS | Status: AC
  Administered 2023-03-27: 500 mL via INTRAVENOUS

## 2023-03-27 MED ORDER — ACETAMINOPHEN 325 MG PO TABS
650.0000 mg | ORAL_TABLET | ORAL | Status: DC | PRN
  Administered 2023-03-27 – 2023-03-28 (×2): 650 mg via ORAL
  Filled 2023-03-27 (×2): qty 2

## 2023-03-27 MED ORDER — LIDOCAINE HCL 1 % IJ SOLN
INTRAMUSCULAR | Status: AC
  Filled 2023-03-27: qty 20

## 2023-03-27 MED ORDER — ORAL CARE MOUTH RINSE: 15.0000 mL | OROMUCOSAL | Status: DC | PRN

## 2023-03-27 NOTE — ED Notes (Addendum)
Per family, pt prefers lights off when sleeping

## 2023-03-27 NOTE — Progress Notes (Signed)
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  On presentation, the patient is hypotensive requiring norepinephrine infusion, tachycardic, and minimally hypothermic.  Metabolic panel with electrolyte derangements, anion gap metabolic acidosis, elevated creatinine, and mild hyperglycemia.  Blood counts within normal limits.  Lactic acid 5.3.  Urinalysis is grossly positive.  Renal stone study showing 15 mm right UPJ stone with moderate hydronephrosis.  eICU Interventions  Cultures were collected, empiric antibiotics in place.  Previously grew out pansensitive E. coli  Urology was consulted, anticipating emergent nephrostomy tube placement after IR conversation.  Add CBG every 4 hours  Additional 500 cc bolus of LR  Increase peripheral norepinephrine ceiling to 20 mcg  DVT prophylaxis with heparin GI prophylaxis not indicated     Intervention Category Evaluation Type: New Patient Evaluation  Melanie Madden 03/27/2023, 1:55 AM

## 2023-03-27 NOTE — Procedures (Signed)
Interventional Radiology Procedure Note  Procedure: Right percutaneous nephrostomy tube placement  Findings: Please refer to procedural dictation for full description.10 Fr right inferior pole nephrostomy to bag drainage.  Purulent urine aspirated and sent for culture.   Complications: None immediate  Estimated Blood Loss:  < 5 mL  Recommendations: Keep to bag drainage.   IR will follow.   Marliss Coots, MD Pager: [phone]

## 2023-03-27 NOTE — Progress Notes (Signed)
She had some KCL infiltration. Advise the rn to do cold or warm compresses. D/w Mannam and we will give 1 dose of hyaluronidase.  Ulyses Southward, PharmD, BCIDP, AAHIVP, CPP Infectious Disease Pharmacist 03/27/2023 5:54 PM

## 2023-03-27 NOTE — Consult Note (Addendum)
Urology Consult Note   Requesting Attending Physician:  Oretha Milch, MD Service Providing Consult: Urology  Consulting Attending: Dr. Dwana Curd, MD   Reason for Consult:  Nephrolithiasis, urosepsis  HPI: Melanie Madden is seen in consultation for reasons noted above at the request of Oretha Milch, MD for evaluation of obstructing ureteral stone with urosepsis.   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 She presented to the ED today with chills/weakness.   In the ED she was initially hemodynamically stable and febrile to 100.5. Her pressures dropped to 80s systolic and she required levophed initiation up to 10.   Labs show no leukocytosis, she has an AKI to 2.18 from baseline 0.96. UA with large LE, negative nitrite, >50 RBC, > 50 WBC, many bacteria. A urine culture has been collected and a foley is in place.  CT shows obstructing RIGHT 15mm UPJ stone with associated mild hydronephrosis and perinephric stranding. She also has bilateral nonobstructing renal stones.   She has a history of nephrolithiasis, requiring emergent right PCN in 2022 for urosepsis. She underwent right USE on 02/08/21 with Dr. Liliane Shi.   Urine culture from 01/16/23 grew pansensitive E coli for which she was treated with keflex.  She takes no blood thinners.   Past Medical History: Past Medical History:  Diagnosis Date   Allergy    Anxiety    Depression    Expressive aphasia    Hemiparesis (HCC)    Right side   High triglycerides    History of kidney stones    Kidney stones    Recurrent sinus infections    Stroke [facility])     Past Surgical History:  Past Surgical History:  Procedure Laterality Date   ANTERIOR CRUCIATE LIGAMENT REPAIR Left     BUNIONECTOMY Right    CYSTOSCOPY/URETEROSCOPY/HOLMIUM LASER/STENT PLACEMENT Right 02/08/2021   Procedure: CYSTOSCOPY/RETROGRADE/URETEROSCOPY/HOLMIUM LASER/STENT PLACEMENT, NEPHROSTOMY TUBE REMOVAL;  Surgeon: Rene Paci, MD;  Location: [facility];  Service: Urology;  Laterality: Right;   IR NEPHROSTOMY PLACEMENT RIGHT  12/09/2020   LASIK     LIPOSUCTION     LITHOTRIPSY     MENISCUS REPAIR Left    NASAL SEPTUM SURGERY     ROBOTIC ASSISTED TOTAL HYSTERECTOMY WITH BILATERAL SALPINGO OOPHERECTOMY Bilateral 12/06/2021   Procedure: XI ROBOTIC ASSISTED BILATERAL SALPINGO OOPHORECTOMY, TOTAL HYSTERECTOMY, PERITONEAL BIOPSY, OMENTECTOMY, LAPAROTOMY, APPENDECTOMY, PAP SMEAR;  Surgeon: Carver Fila, MD;  Location: [facility];  Service: Gynecology;  Laterality: Bilateral;   SHOULDER SURGERY     VARICOSE VEIN SURGERY Left     Medication: Current Facility-Administered Medications  Medication Dose Route Frequency Provider Last Rate Last Admin   0.9 %  sodium chloride infusion  250 mL Intravenous Continuous Zadie Rhine, MD       0.9 %  sodium chloride infusion   Intravenous Continuous Oretha Milch, MD 75 mL/hr at 03/27/23 0152 New Bag at 03/27/23 0152   acetaminophen (TYLENOL) tablet 650 mg  650 mg Oral Q4H PRN Oretha Milch, MD       cefTRIAXone (ROCEPHIN) 2 g in sodium chloride 0.9 % 100 mL IVPB  2 g Intravenous Q24H Zadie Rhine, MD   Stopped at 03/27/23 0039   Chlorhexidine Gluconate Cloth 2 % PADS 6 each  6 each  Topical Q0600 Oretha Milch, MD       docusate sodium (COLACE) capsule 100 mg  100 mg Oral BID PRN Oretha Milch, MD       heparin injection 5,000 Units  5,000 Units Subcutaneous Q8H Cyril Mourning V, MD       insulin aspart (novoLOG) injection 0-15 Units  0-15 Units Subcutaneous Q4H Paliwal, Aditya, MD       lactated ringers bolus 500 mL  500 mL Intravenous Once Paliwal, Aditya, MD       lactated ringers infusion   Intravenous Continuous Zadie Rhine, MD   Stopped  at 03/27/23 0040   norepinephrine (LEVOPHED) 4mg  in (0.016 mg/mL) premix infusion  2-20 mcg/min Intravenous Titrated Paliwal, Aditya, MD 37.5 mL/hr at 03/27/23 0205 10 mcg/min at 03/27/23 0205   ondansetron (ZOFRAN) injection 4 mg  4 mg Intravenous Q6H PRN Oretha Milch, MD       polyethylene glycol (MIRALAX / GLYCOLAX) packet 17 g  17 g Oral Daily PRN Oretha Milch, MD        Allergies: Allergies  Allergen Reactions   Amoxicillin Nausea Only   Shellfish Allergy Diarrhea and Nausea And Vomiting    Mussels     Social History: Social History   Tobacco Use   Smoking status: Former   Smokeless tobacco: Former  Building services engineer status: Never Used  Substance Use Topics   Alcohol use: Not Currently   Drug use: No    Family History Family History  Problem Relation Age of Onset   Alzheimer's disease Mother    Hypertension Mother    Diabetes Mother    Thyroid disease Mother    Deep vein thrombosis Maternal Grandmother    Colon cancer Neg Hx    Colon polyps Neg Hx    Esophageal cancer Neg Hx    Pancreatic cancer Neg Hx    Rectal cancer Neg Hx    Stomach cancer Neg Hx    Breast cancer Neg Hx     Review of Systems 10 systems were reviewed and are negative except as noted specifically in the HPI.  Objective   Vital signs in last 24 hours: BP (!) 82/59   Pulse (!) 101   Temp 99.7 F (37.6 C)   Resp 15   SpO2 97%   Physical Exam General: NAD, A&O, resting, appropriate HEENT: [place]/AT, EOMI, MMM Pulmonary: Normal work of breathing Cardiovascular: regular rate, levophed to 10 Abdomen: Soft, NTTP, nondistended GU: Foley in place with clear amber urine. No appreciable CVAT Extremities: warm and well perfused Neuro: Appropriate, right hemiparesis  Most Recent Labs: Lab Results  Component Value Date   WBC 7.7 03/26/2023   HGB 13.4 03/26/2023   HCT 40.8 03/26/2023   PLT 254 03/26/2023    Lab Results  Component Value Date   NA 133 (L) 03/26/2023   K 3.0  (L) 03/26/2023   CL 97 (L) 03/26/2023   CO2 15 (L) 03/26/2023   BUN 30 (H) 03/26/2023   CREATININE 2.18 (H) 03/26/2023   CALCIUM 10.1 03/26/2023   MG 1.7 12/15/2020   PHOS 2.3 (L) 12/15/2020    Lab Results  Component Value Date   INR 1.2 03/26/2023   APTT 30 03/26/2023     Urine Culture: @[id number] (laburin,org,[id number])@   IMAGING: CT Renal Stone Study  Result Date: 03/27/2023 CLINICAL DATA:  Abdominal pain.  Concern for kidney stone. EXAM: CT ABDOMEN AND PELVIS WITHOUT CONTRAST TECHNIQUE: Multidetector CT imaging of the  abdomen and pelvis was performed following the standard protocol without IV contrast. RADIATION DOSE REDUCTION: This exam was performed according to the departmental dose-optimization program which includes automated exposure control, adjustment of the mA and/or kV according to patient size and/or use of iterative reconstruction technique. COMPARISON:  CT abdomen pelvis dated 01/21/2023. FINDINGS: Evaluation of this exam is limited in the absence of intravenous contrast. Lower chest: The visualized lung bases are clear. No intra-abdominal free air.  Trace free fluid in the pelvis. Hepatobiliary: The liver is unremarkable. No biliary dilatation. The gallbladder is unremarkable. Pancreas: Unremarkable. No pancreatic ductal dilatation or surrounding inflammatory changes. Spleen: Normal in size without focal abnormality. Adrenals/Urinary Tract: The right adrenal glands unremarkable. There is a 4.4 cm left adrenal adenoma similar to prior CT. There is a 15 mm stone at the right ureteropelvic junction with mild-to-moderate right hydronephrosis. Multiple additional nonobstructing bilateral renal calculi noted. There is no hydronephrosis on the left. There is mild left renal parenchyma atrophy. The visualized ureters appear unremarkable. The Foley catheter is noted within the urinary bladder. The urinary bladder is minimally distended. Air within the bladder introduced via the  catheter. Stomach/Bowel: Mild sigmoid diverticulosis without active inflammatory changes. There is no bowel obstruction or active inflammation. Appendectomy. Vascular/Lymphatic: Mild atherosclerotic calcification of the abdominal aorta. The IVC is unremarkable. No portal venous gas. There is no adenopathy. Reproductive: Hysterectomy.  No adnexal masses. Other: None Musculoskeletal: Degenerative changes.  No acute osseous pathology. IMPRESSION: 1. A 15 mm right UPJ stone with mild-to-moderate right hydronephrosis. 2. Multiple additional nonobstructing bilateral renal calculi. 3. Stable left adrenal adenoma. 4. Mild sigmoid diverticulosis. No bowel obstruction. 5.  Aortic Atherosclerosis (ICD10-I70.0). Electronically Signed   By: Elgie Collard M.D.   On: 03/27/2023 01:24   DG Chest Portable 1 View  Result Date: 03/27/2023 CLINICAL DATA:  Sepsis, weakness. EXAM: PORTABLE CHEST 1 VIEW COMPARISON:  12/09/2020. FINDINGS: The heart size and mediastinal contours are within normal limits. There is atherosclerotic calcification of the aorta. Lung volumes are low with mild atelectasis at the left lung base. No effusion or pneumothorax. No acute osseous abnormality. IMPRESSION: Mild atelectasis at the left lung base. Electronically Signed   By: Thornell Sartorius M.D.   On: 03/27/2023 00:24   DG Foot Complete Right  Result Date: 03/26/2023 CLINICAL DATA:  Right foot and ankle swelling, initial encounter EXAM: RIGHT FOOT COMPLETE - 3+ VIEW COMPARISON:  None Available. FINDINGS: Mild hallux valgus deformity is noted. Deformity in the distal first metatarsal is noted likely related to prior surgery. No acute fracture or dislocation is noted. No soft tissue abnormality is seen. IMPRESSION: Chronic changes without acute abnormality. Electronically Signed   By: Alcide Clever M.D.   On: 03/26/2023 21:01    ------  Assessment:  57 y.o. female with history of CVA, expressive aphasia, right hemiparesis, metastatic goblet cell  appendiceal cancer, and obstructing right 15mm UPJ stone with urosepsis.   We discussed the indications for acute intervention including infected obstruction, bilateral ureteral obstruction or unilateral obstruction of solitary kidney as well as other less urgent indications for decompression which would included intractable pain, N/V, and acute renal injury. We also discussed the possible inability to place a ureteral stent in which case, the next intervention recommended would be a percutaneous nephrostomy tube. Given urosepsis is present, plan for emergent RIGHT PCN placement.    Recommendations: Plan for emergent right PCN placement. Case has been discussed with on-call IR provider Keep patient NPO Follow up urine culture  CTX for abx, tailor as able Appreciate excellent ICU care Patient will require eventual outpatient stone management (USE vs PCNL) when she has stabilized from her acute illness  Thank you for this consult. Dr. Berneice Heinrich is the attending of record. Please contact the urology consult pager with any further questions/concerns.

## 2023-03-27 NOTE — Sepsis Progress Note (Addendum)
Notified bedside nurse of need to draw repeat lactic acid.  Per ED RN, she was unable to collect lactic prior to transfer to ICU.  Notified ICU nurse that patient needs repeat lactic collected.

## 2023-03-27 NOTE — Consult Note (Signed)
Chief Complaint: Patient was seen in consultation today for nephrolithiasis complicated by urosepsis  Referring Physician(s): Cyril Mourning, MD  History of Present Illness: Melanie Madden is a 57 y.o. female with history significant for CVA and nephrolithiasis who presented to the [facility] ED on 03/26/23 with abdominal pain and chills.  CT abdomen pelvis was performed which demonstrated obstructive right nephrolithiasis with associated hydronephrosis.  She has been started on levophed due to hypotension and IV Rocephin.  In May 2022 she presented in a similar fashion and underwent right percutaneous nephrostomy tube placement followed approximately 3 weeks later by percutaneous lithotripsy and stone removal.  History was provided by her husband, Dorise Hiss.  Past Medical History:  Diagnosis Date   Allergy    Anxiety    Depression    Expressive aphasia    Hemiparesis (HCC)    Right side   High triglycerides    History of kidney stones    Kidney stones    Recurrent sinus infections    Stroke [facility])     Past Surgical History:  Procedure Laterality Date   ANTERIOR CRUCIATE LIGAMENT REPAIR Left    BUNIONECTOMY Right    CYSTOSCOPY/URETEROSCOPY/HOLMIUM LASER/STENT PLACEMENT Right 02/08/2021   Procedure: CYSTOSCOPY/RETROGRADE/URETEROSCOPY/HOLMIUM LASER/STENT PLACEMENT, NEPHROSTOMY TUBE REMOVAL;  Surgeon: Rene Paci, MD;  Location: [facility];  Service: Urology;  Laterality: Right;   IR NEPHROSTOMY PLACEMENT RIGHT  12/09/2020   LASIK     LIPOSUCTION     LITHOTRIPSY     MENISCUS REPAIR Left    NASAL SEPTUM SURGERY     ROBOTIC ASSISTED TOTAL HYSTERECTOMY WITH BILATERAL SALPINGO OOPHERECTOMY Bilateral 12/06/2021   Procedure: XI ROBOTIC ASSISTED BILATERAL SALPINGO OOPHORECTOMY, TOTAL HYSTERECTOMY, PERITONEAL BIOPSY, OMENTECTOMY, LAPAROTOMY, APPENDECTOMY, PAP SMEAR;  Surgeon: Carver Fila, MD;  Location: [facility];  Service: Gynecology;  Laterality: Bilateral;    SHOULDER SURGERY     VARICOSE VEIN SURGERY Left     Allergies: Amoxicillin and Shellfish allergy  Medications: Prior to Admission medications   Medication Sig Start Date End Date Taking? Authorizing Provider  amLODipine (NORVASC) 10 MG tablet Take 10 mg by mouth daily.   Yes [provider]  atorvastatin (LIPITOR) 20 MG tablet Take 1 tablet (20 mg total) by mouth daily at 6 PM. Patient taking differently: Take 20 mg by mouth daily. 08/20/19  Yes Angiulli, Mcarthur Rossetti, PA-C  B Complex-C (B-COMPLEX WITH VITAMIN C) tablet Take 1 tablet by mouth daily.   Yes [provider]  Bisacodyl ([employer] C-LAX LAXATIVE PO) Take by mouth as needed (constiipation).   Yes [provider]  CALCIUM PO Take 1 tablet by mouth daily.   Yes [provider]  Cholecalciferol (VITAMIN D) 50 MCG (2000 UT) CAPS Take 2,000 Units by mouth daily.   Yes [provider]  ferrous sulfate 325 (65 FE) MG EC tablet Take 325 mg by mouth daily with breakfast.   Yes [provider]  gabapentin (NEURONTIN) 100 MG capsule 100 mg at bedtime. 02/27/23  Yes [provider]  magnesium 30 MG tablet Take 30 mg by mouth daily.   Yes [provider]  Omega-3 Fatty Acids (FISH OIL PO) Take by mouth.   Yes [provider]  VITAMIN E PO Take 1 tablet by mouth daily.   Yes [provider]  zinc gluconate 50 MG tablet Take 50 mg by mouth daily.   Yes [provider]     Family History  Problem Relation Age of Onset  Alzheimer's disease Mother    Hypertension Mother    Diabetes Mother    Thyroid disease Mother    Deep vein thrombosis Maternal Grandmother    Colon cancer Neg Hx    Colon polyps Neg Hx    Esophageal cancer Neg Hx    Pancreatic cancer Neg Hx    Rectal cancer Neg Hx    Stomach cancer Neg Hx    Breast cancer Neg Hx     Social History   Socioeconomic History   Marital status: Married    Spouse name: Not on file   Number of  children: Not on file   Years of education: Not on file   Highest education level: Not on file  Occupational History   Not on file  Tobacco Use   Smoking status: Former   Smokeless tobacco: Former  Building services engineer status: Never Used  Substance and Sexual Activity   Alcohol use: Not Currently   Drug use: No   Sexual activity: Yes    Partners: Male  Other Topics Concern   Not on file  Social History Narrative   Not on file   Social Determinants of Health   Financial Resource Strain: Not on file  Food Insecurity: Not on file  Transportation Needs: Not on file  Physical Activity: Not on file  Stress: Not on file  Social Connections: Not on file    Review of Systems: A 12 point ROS discussed and pertinent positives are indicated in the HPI above.  All other systems are negative.  Vital Signs: BP (!) 82/59   Pulse (!) 101   Temp 99.7 F (37.6 C)   Resp 15   SpO2 97%   Physical Exam Constitutional:      Appearance: She is ill-appearing.  HENT:     Head: Normocephalic.     Mouth/Throat:     Mouth: Mucous membranes are dry.     Comments: MP2 Eyes:     General: No scleral icterus. Cardiovascular:     Rate and Rhythm: Tachycardia present.  Pulmonary:     Effort: Pulmonary effort is normal.  Abdominal:     General: There is no distension.  Skin:    General: Skin is warm and dry.  Neurological:     Mental Status: Mental status is at baseline.     Imaging: CT Renal Stone Study  Result Date: 03/27/2023 CLINICAL DATA:  Abdominal pain.  Concern for kidney stone. EXAM: CT ABDOMEN AND PELVIS WITHOUT CONTRAST TECHNIQUE: Multidetector CT imaging of the abdomen and pelvis was performed following the standard protocol without IV contrast. RADIATION DOSE REDUCTION: This exam was performed according to the departmental dose-optimization program which includes automated exposure control, adjustment of the mA and/or kV according to patient size and/or use of iterative  reconstruction technique. COMPARISON:  CT abdomen pelvis dated 01/21/2023. FINDINGS: Evaluation of this exam is limited in the absence of intravenous contrast. Lower chest: The visualized lung bases are clear. No intra-abdominal free air.  Trace free fluid in the pelvis. Hepatobiliary: The liver is unremarkable. No biliary dilatation. The gallbladder is unremarkable. Pancreas: Unremarkable. No pancreatic ductal dilatation or surrounding inflammatory changes. Spleen: Normal in size without focal abnormality. Adrenals/Urinary Tract: The right adrenal glands unremarkable. There is a 4.4 cm left adrenal adenoma similar to prior CT. There is a 15 mm stone at the right ureteropelvic junction with mild-to-moderate right hydronephrosis. Multiple additional nonobstructing bilateral renal calculi noted. There is no hydronephrosis on the left. There  is mild left renal parenchyma atrophy. The visualized ureters appear unremarkable. The Foley catheter is noted within the urinary bladder. The urinary bladder is minimally distended. Air within the bladder introduced via the catheter. Stomach/Bowel: Mild sigmoid diverticulosis without active inflammatory changes. There is no bowel obstruction or active inflammation. Appendectomy. Vascular/Lymphatic: Mild atherosclerotic calcification of the abdominal aorta. The IVC is unremarkable. No portal venous gas. There is no adenopathy. Reproductive: Hysterectomy.  No adnexal masses. Other: None Musculoskeletal: Degenerative changes.  No acute osseous pathology. IMPRESSION: 1. A 15 mm right UPJ stone with mild-to-moderate right hydronephrosis. 2. Multiple additional nonobstructing bilateral renal calculi. 3. Stable left adrenal adenoma. 4. Mild sigmoid diverticulosis. No bowel obstruction. 5.  Aortic Atherosclerosis (ICD10-I70.0). Electronically Signed   By: Elgie Collard M.D.   On: 03/27/2023 01:24   DG Chest Portable 1 View  Result Date: 03/27/2023 CLINICAL DATA:  Sepsis, weakness.  EXAM: PORTABLE CHEST 1 VIEW COMPARISON:  12/09/2020. FINDINGS: The heart size and mediastinal contours are within normal limits. There is atherosclerotic calcification of the aorta. Lung volumes are low with mild atelectasis at the left lung base. No effusion or pneumothorax. No acute osseous abnormality. IMPRESSION: Mild atelectasis at the left lung base. Electronically Signed   By: Thornell Sartorius M.D.   On: 03/27/2023 00:24   DG Foot Complete Right  Result Date: 03/26/2023 CLINICAL DATA:  Right foot and ankle swelling, initial encounter EXAM: RIGHT FOOT COMPLETE - 3+ VIEW COMPARISON:  None Available. FINDINGS: Mild hallux valgus deformity is noted. Deformity in the distal first metatarsal is noted likely related to prior surgery. No acute fracture or dislocation is noted. No soft tissue abnormality is seen. IMPRESSION: Chronic changes without acute abnormality. Electronically Signed   By: Alcide Clever M.D.   On: 03/26/2023 21:01    Labs:  CBC: Recent Labs    01/16/23 1237 01/21/23 1328 03/26/23 2039  WBC 6.3 6.1 7.7  HGB 14.8 14.3 13.4  HCT 44.6 42.9 40.8  PLT 186 202 254    COAGS: Recent Labs    03/26/23 2336  INR 1.2  APTT 30    BMP: Recent Labs    08/06/22 1211 01/16/23 1237 01/21/23 1328 03/26/23 2039  NA 141 138 137 133*  K 3.8 3.5 3.6 3.0*  CL 102 103 103 97*  CO2 27 26 26  15*  GLUCOSE 114* 166* 120* 217*  BUN 16 20 22* 30*  CALCIUM 10.3 9.9 9.7 10.1  CREATININE 0.80 0.88 0.96 2.18*  GFRNONAA >60 >60 >60 26*    LIVER FUNCTION TESTS: Recent Labs    01/16/23 1237 01/21/23 1328  BILITOT 0.7 0.5  AST 17 16  ALT 19 20  ALKPHOS 89 73  PROT 7.1 6.6  ALBUMIN 3.8 3.5    TUMOR MARKERS: Recent Labs    08/06/22 1211 01/01/23 1128  CEA 2.64 2.81    Assessment and Plan: 57 year old female with history of CVA with right hemiparesis and aphasia and prior obstructive right nephrolithiasis presenting with urosepsis secondary to obstructive right ureteropelvic  junction nephrolithiasis.  Plan for image guided right percutaneous nephrostomy placement with moderate sedation.  Risks and benefits discussed with the patient's husband including bleeding, infection, damage to adjacent structures, bowel perforation/fistula connection, and sepsis.  All of the patient's questions were answered, patient is agreeable to proceed. Consent signed and in chart.    Electronically Signed: Bennie Dallas, MD 03/27/2023, 3:21 AM   I spent a total of 20 Minutes  in face to face  in clinical consultation, greater than 50% of which was counseling/coordinating care for obstructive nephrolithiasis complicated by urosepsis.

## 2023-03-27 NOTE — Progress Notes (Signed)
NAME:  Melanie Madden, MRN:  [id number], DOB:  Sep 06, 1965, LOS: 0 ADMISSION DATE:  03/26/2023, CONSULTATION DATE:  03/27/2023  REFERRING MD:  Bebe Shaggy EDP, CHIEF COMPLAINT: Abdominal pain  History of Present Illness:  57 year old BIBEMS from home he initially thought to have right foot pain and swelling after she hit it on something.  Husband reported worsening lower abdominal pain over the past week and abrupt onset of chills.  Initially normotensive but then developed hypotension during ER stay  , lowest blood pressure 79/60. Labs significant for lactate 5.3, no leukocytosis, BUN/creatinine 30/2.2, hypokalemia 3.0, hyponatremia 133 She was given 2 L of fluid and ceftriaxone, then started on peripheral Levophed. 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   Objective   Blood pressure 99/77, pulse 99, temperature 99.9 F (37.7 C), resp. rate (!) 24, weight 74.2 kg, SpO2 96%.        Intake/Output Summary (Last 24 hours) at 03/27/2023 0754 Last data filed at 03/27/2023 0500 Gross per 24 hour  Intake 3100.07 ml  Output 800 ml  Net 2300.07 ml   Filed Weights   03/27/23 0500  Weight: 74.2 kg    Examination: Constitutional:Ill-appearing but in no acute distress. Cardio:Regular rate and rhythm. No murmurs, rubs, or gallops. Pulm:Clear to  auscultation bilaterally. Normal work of breathing on room air. Chest: Abdomen:Soft, tender to palpation over suprapubic area. L PCT drain with minimal serosanguineous drainage ZHY:QMVHQION for extremity edema. Skin:Warm and dry. Neuro:Alert and interactive. Aphasia. R hemiparesis.  Labs WBC 43.3 (7.7), Hgb 10.3 (13.4) Na 134, K 2.9, bicarb 18 (15), glucose 234, BUN 29 (30)/Cr 1.90 (2.18), Ca 8.7, GFR 30 (26) Mg 1.7 Phos 3.2 Lactic acid 3.7 (5.3) HbA1c 5.6%  Resolved Hospital Problem list   N/A  Assessment & Plan:  Septic shock with UTI Bilateral nephrolithiasis, right UPJ stone with moderate hydronephrosis Intermittently tachycardic overnight. Most recent temp 99.9, leukocytosis with large increase overnight. Remains on levophed, ceftriaxone. Urology consulted ON, had emergent percutaneous nephrostomy tube placed by IR which was noted to have purulent urine on drainage. Lactic acid improved by remains elevated. Urine culture pending, blood cultures with NGTD. Plan: -Continue levophed, wean as able -LR 500 cc bolus, then continue LR 100 cc/h x 10h -Continue ceftriaxone, narrow as urine culture result comes in -Trend lactic acid -F/u urine culture  AKI Hyporkalemia Hyponatremia Some improvement with IVF overnight.  -Replete K -Trend renal function -Continue LR 100 mL/h -Avoid contrast and other nephrotoxins  Hx CVA Plan: -Resume home gabapentin -Will need PT consult once more stable  Hx HTN Plan: -Continue to hold home amlodipine  Best Practice (right click and "Reselect all SmartList Selections" daily)   Diet/type: Regular DVT prophylaxis: prophylactic heparin  GI prophylaxis: N/A Lines: N/A Foley:  Yes, and it is still needed Code Status:  full code Last date of multidisciplinary goals of care discussion: 08/27  Labs   CBC: Recent  Labs  Lab 03/26/23 2039 03/27/23 0537  WBC 7.7 43.3*  NEUTROABS 7.3  --   HGB 13.4 10.3*  HCT 40.8 30.2*  MCV 86.6 87.3   PLT 254 230    Basic Metabolic Panel: Recent Labs  Lab 03/26/23 2039 03/27/23 0537  NA 133* 134*  K 3.0* 2.9*  CL 97* 104  CO2 15* 18*  GLUCOSE 217* 234*  BUN 30* 29*  CREATININE 2.18* 1.90*  CALCIUM 10.1 8.7*  MG  --  1.7  PHOS  --  3.2   GFR: Estimated Creatinine Clearance: 34.3 mL/min (A) (by C-G formula based on SCr of 1.9 mg/dL (H)). Recent Labs  Lab 03/26/23 2039 03/26/23 2353 03/27/23 0537  WBC 7.7  --  43.3*  LATICACIDVEN  --  5.3* 3.7*    Liver Function Tests: No results for input(s): "AST", "ALT", "ALKPHOS", "BILITOT", "PROT", "ALBUMIN" in the last 168 hours. No results for input(s): "LIPASE", "AMYLASE" in the last 168 hours. No results for input(s): "AMMONIA" in the last 168 hours.  ABG    Component Value Date/Time   TCO2 24 07/09/2019 1956     Coagulation Profile: Recent Labs  Lab 03/26/23 2336  INR 1.2    Cardiac Enzymes: No results for input(s): "CKTOTAL", "CKMB", "CKMBINDEX", "TROPONINI" in the last 168 hours.  HbA1C: Hgb A1c MFr Bld  Date/Time Value Ref Range Status  03/27/2023 05:37 AM 5.6 4.8 - 5.6 % Final    Comment:    (NOTE) Pre diabetes:          5.7%-6.4%  Diabetes:              >6.4%  Glycemic control for   <7.0% adults with diabetes   12/09/2020 01:11 AM 5.2 4.8 - 5.6 % Final    Comment:    (NOTE) Pre diabetes:          5.7%-6.4%  Diabetes:              >6.4%  Glycemic control for   <7.0% adults with diabetes     CBG: Recent Labs  Lab 03/27/23 0315 03/27/23 0736  GLUCAP 212* 230*    Review of Systems:   As expressed by husband in HPI. Unable to obtain from patient since aphasic and not reliable  Past Medical History:  She,  has a past medical history of Allergy, Anxiety, Depression, Expressive aphasia, Hemiparesis (HCC), High triglycerides, History of kidney stones, Kidney stones, Recurrent sinus infections, and Stroke (HCC).   Surgical History:   Past Surgical History:  Procedure Laterality Date    ANTERIOR CRUCIATE LIGAMENT REPAIR Left    BUNIONECTOMY Right    CYSTOSCOPY/URETEROSCOPY/HOLMIUM LASER/STENT PLACEMENT Right 02/08/2021   Procedure: CYSTOSCOPY/RETROGRADE/URETEROSCOPY/HOLMIUM LASER/STENT PLACEMENT, NEPHROSTOMY TUBE REMOVAL;  Surgeon: Rene Paci, MD;  Location: [facility];  Service: Urology;  Laterality: Right;   IR NEPHROSTOMY PLACEMENT RIGHT  12/09/2020   LASIK     LIPOSUCTION     LITHOTRIPSY     MENISCUS REPAIR Left    NASAL SEPTUM SURGERY     ROBOTIC ASSISTED TOTAL HYSTERECTOMY WITH BILATERAL SALPINGO OOPHERECTOMY Bilateral 12/06/2021   Procedure: XI ROBOTIC ASSISTED BILATERAL SALPINGO OOPHORECTOMY, TOTAL HYSTERECTOMY, PERITONEAL BIOPSY, OMENTECTOMY, LAPAROTOMY, APPENDECTOMY, PAP SMEAR;  Surgeon: Carver Fila, MD;  Location: [facility];  Service: Gynecology;  Laterality: Bilateral;   SHOULDER SURGERY     VARICOSE VEIN SURGERY Left      Social History:   reports that she has quit smoking. She has quit using smokeless tobacco. She reports  that she does not currently use alcohol. She reports that she does not use drugs.   Family History:  Her family history includes Alzheimer's disease in her mother; Deep vein thrombosis in her maternal grandmother; Diabetes in her mother; Hypertension in her mother; Thyroid disease in her mother. There is no history of Colon cancer, Colon polyps, Esophageal cancer, Pancreatic cancer, Rectal cancer, Stomach cancer, or Breast cancer.   Allergies Allergies  Allergen Reactions   Amoxicillin Nausea Only   Shellfish Allergy Diarrhea and Nausea And Vomiting    Mussels      Home Medications  Prior to Admission medications   Medication Sig Start Date End Date Taking? Authorizing Provider  amLODipine (NORVASC) 10 MG tablet Take 10 mg by mouth daily.   Yes [provider]  atorvastatin (LIPITOR) 20 MG tablet Take 1 tablet (20 mg total) by mouth daily at 6 PM. Patient taking differently: Take 20 mg by mouth daily.  08/20/19  Yes Angiulli, Mcarthur Rossetti, PA-C  B Complex-C (B-COMPLEX WITH VITAMIN C) tablet Take 1 tablet by mouth daily.   Yes [provider]  Bisacodyl ([employer] C-LAX LAXATIVE PO) Take by mouth as needed (constiipation).   Yes [provider]  CALCIUM PO Take 1 tablet by mouth daily.   Yes [provider]  Cholecalciferol (VITAMIN D) 50 MCG (2000 UT) CAPS Take 2,000 Units by mouth daily.   Yes [provider]  ferrous sulfate 325 (65 FE) MG EC tablet Take 325 mg by mouth daily with breakfast.   Yes [provider]  gabapentin (NEURONTIN) 100 MG capsule 100 mg at bedtime. 02/27/23  Yes [provider]  magnesium 30 MG tablet Take 30 mg by mouth daily.   Yes [provider]  Omega-3 Fatty Acids (FISH OIL PO) Take by mouth.   Yes [provider]  VITAMIN E PO Take 1 tablet by mouth daily.   Yes [provider]  zinc gluconate 50 MG tablet Take 50 mg by mouth daily.   Yes [provider]     Critical care time: 40 min       Champ Mungo, DO Internal Medicine PGY-3  03/27/2023

## 2023-03-27 NOTE — Progress Notes (Signed)
[facility] ADULT ICU REPLACEMENT PROTOCOL   The patient does apply for the [facility] Adult ICU Electrolyte Replacment Protocol based on the criteria listed below:   1.Exclusion criteria: TCTS, ECMO, Dialysis, and Myasthenia Gravis patients 2. Is GFR >/= 30 ml/min? Yes.    Patient's GFR today is 30 3. Is SCr </= 2? Yes.   Patient's SCr is 1.90 mg/dL 4. Did SCr increase >/= 0.5 in 24 hours? No. 5.Pt's weight >40kg  Yes.   6. Abnormal electrolyte(s): K+ 2.9, mag 1.7  7. Electrolytes replaced per protocol 8.  Call MD STAT for K+ </= 2.5, Phos </= 1, or Mag </= 1 Physician:  Larinda Buttery [facility] 03/27/2023 6:14 AM

## 2023-03-27 NOTE — H&P (Signed)
NAME:  Melanie Madden, MRN:  [id number], DOB:  1966-01-01, LOS: 0 ADMISSION DATE:  03/26/2023, CONSULTATION DATE:  03/27/2023  REFERRING MD:  Bebe Shaggy EDP, CHIEF COMPLAINT: Abdominal pain  History of Present Illness:  57 year old BIBEMS from home he initially thought to have right foot pain and swelling after she hit it on something.  Husband reported worsening lower abdominal pain over the past week and abrupt onset of chills.  Initially normotensive but then developed hypotension during ER stay  , lowest blood pressure 79/60. Labs significant for lactate 5.3, no leukocytosis, BUN/creatinine 30/2.2, hypokalemia 3.0, hyponatremia 133 She was given 2 L of fluid and ceftriaxone, then started on peripheral Levophed. 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 rate 16, SpO2 96%.        Intake/Output Summary (Last 24 hours) at 03/27/2023 0106 Last data filed at 03/27/2023 0100 Gross per 24 hour  Intake 1100.07 ml  Output --  Net 1100.07 ml   There were no vitals filed for this visit.  Examination: General: Middle-aged woman, lying supine in ED stretcher, no distress HENT: No pallor, icterus, no JVD Lungs: Clear bilateral, no accessory muscle use Cardiovascular:  S1-S2 regular, sinus on monitor Abdomen: Soft, nontender, no costophrenic angle tenderness Extremities: Cool, no edema, right toe was purple, now appears pale Neuro: Right hemiparesis, aphasia GU: Clear urine in Foley bag   EKG showed sinus tachycardia. Portable chest x-ray did not show any infiltrates UA with large leuk esterase, bacteria positive, more than 50 white cells   Resolved Hospital Problem list     Assessment & Plan:  Septic shock with UTI Bilateral nephrolithiasis, right UPJ stone with moderate hydronephrosis  -Levophed PIV -Empiric ceftriaxone, previous urine cultures have shown pansensitive E. Coli -Urology to consult, keep n.p.o. in anticipation of procedure -Repeat lactate for clearance  AKI -monitor urine output and renal function Avoid contrast and other nephrotoxins  CVA -hold gabapentin , PT consult once off pressors Hypertension -hold amlodipine    Best Practice (right click and "Reselect all SmartList Selections" daily)   Diet/type: NPO DVT prophylaxis: prophylactic heparin  GI prophylaxis: N/A Lines: N/A Foley:  N/A Code Status:  full code Last date of multidisciplinary goals of care discussion [NA] updated husband at bedside  Labs   CBC: Recent Labs  Lab 03/26/23 2039  WBC 7.7  NEUTROABS 7.3  HGB 13.4  HCT 40.8  MCV 86.6  PLT 254    Basic Metabolic Panel: Recent Labs  Lab 03/26/23 2039  NA 133*  K 3.0*  CL 97*  CO2 15*  GLUCOSE 217*  BUN 30*  CREATININE 2.18*  CALCIUM 10.1   GFR: CrCl cannot be calculated (Unknown ideal weight.). Recent Labs  Lab 03/26/23 2039 03/26/23 2353  WBC 7.7  --   LATICACIDVEN  --  5.3*  Liver Function Tests: No results for input(s): "AST", "ALT", "ALKPHOS", "BILITOT", "PROT", "ALBUMIN" in the last 168 hours. No results for input(s): "LIPASE", "AMYLASE" in the last 168 hours. No results for input(s): "AMMONIA" in the last 168 hours.  ABG    Component Value Date/Time   TCO2 24  07/09/2019 1956     Coagulation Profile: Recent Labs  Lab 03/26/23 2336  INR 1.2    Cardiac Enzymes: No results for input(s): "CKTOTAL", "CKMB", "CKMBINDEX", "TROPONINI" in the last 168 hours.  HbA1C: Hgb A1c MFr Bld  Date/Time Value Ref Range Status  12/09/2020 01:11 AM 5.2 4.8 - 5.6 % Final    Comment:    (NOTE) Pre diabetes:          5.7%-6.4%  Diabetes:              >6.4%  Glycemic control for   <7.0% adults with diabetes   07/10/2019 11:45 AM 5.3 4.8 - 5.6 % Final    Comment:    (NOTE) Pre diabetes:          5.7%-6.4% Diabetes:              >6.4% Glycemic control for   <7.0% adults with diabetes     CBG: No results for input(s): "GLUCAP" in the last 168 hours.  Review of Systems:   As expressed by husband in HPI. Unable to obtain from patient since aphasic and not reliable  Past Medical History:  She,  has a past medical history of Allergy, Anxiety, Depression, Expressive aphasia, Hemiparesis (HCC), High triglycerides, History of kidney stones, Kidney stones, Recurrent sinus infections, and Stroke (HCC).   Surgical History:   Past Surgical History:  Procedure Laterality Date   ANTERIOR CRUCIATE LIGAMENT REPAIR Left    BUNIONECTOMY Right    CYSTOSCOPY/URETEROSCOPY/HOLMIUM LASER/STENT PLACEMENT Right 02/08/2021   Procedure: CYSTOSCOPY/RETROGRADE/URETEROSCOPY/HOLMIUM LASER/STENT PLACEMENT, NEPHROSTOMY TUBE REMOVAL;  Surgeon: Rene Paci, MD;  Location: [facility];  Service: Urology;  Laterality: Right;   IR NEPHROSTOMY PLACEMENT RIGHT  12/09/2020   LASIK     LIPOSUCTION     LITHOTRIPSY     MENISCUS REPAIR Left    NASAL SEPTUM SURGERY     ROBOTIC ASSISTED TOTAL HYSTERECTOMY WITH BILATERAL SALPINGO OOPHERECTOMY Bilateral 12/06/2021   Procedure: XI ROBOTIC ASSISTED BILATERAL SALPINGO OOPHORECTOMY, TOTAL HYSTERECTOMY, PERITONEAL BIOPSY, OMENTECTOMY, LAPAROTOMY, APPENDECTOMY, PAP SMEAR;  Surgeon: Carver Fila, MD;  Location: [facility];  Service:  Gynecology;  Laterality: Bilateral;   SHOULDER SURGERY     VARICOSE VEIN SURGERY Left      Social History:   reports that she has quit smoking. She has quit using smokeless tobacco. She reports that she does not currently use alcohol. She reports that she does not use drugs.   Family History:  Her family history includes Alzheimer's disease in her mother; Deep vein thrombosis in her maternal grandmother; Diabetes in her mother; Hypertension in her mother; Thyroid disease in her mother. There is no history of Colon cancer, Colon polyps, Esophageal cancer, Pancreatic cancer, Rectal cancer, Stomach cancer, or Breast cancer.   Allergies Allergies  Allergen Reactions   Amoxicillin Nausea Only   Shellfish Allergy Diarrhea and Nausea And Vomiting    Mussels      Home Medications  Prior to Admission medications   Medication Sig Start Date End Date Taking? Authorizing Provider  amLODipine (NORVASC) 10 MG tablet Take 10 mg by mouth daily.   Yes [provider]  atorvastatin (LIPITOR) 20 MG tablet Take 1  tablet (20 mg total) by mouth daily at 6 PM. Patient taking differently: Take 20 mg by mouth daily. 08/20/19  Yes Angiulli, Mcarthur Rossetti, PA-C  B Complex-C (B-COMPLEX WITH VITAMIN C) tablet Take 1 tablet by mouth daily.   Yes [provider]  Bisacodyl ([employer] C-LAX LAXATIVE PO) Take by mouth as needed (constiipation).   Yes [provider]  CALCIUM PO Take 1 tablet by mouth daily.   Yes [provider]  Cholecalciferol (VITAMIN D) 50 MCG (2000 UT) CAPS Take 2,000 Units by mouth daily.   Yes [provider]  ferrous sulfate 325 (65 FE) MG EC tablet Take 325 mg by mouth daily with breakfast.   Yes [provider]  gabapentin (NEURONTIN) 100 MG capsule 100 mg at bedtime. 02/27/23  Yes [provider]  magnesium 30 MG tablet Take 30 mg by mouth daily.   Yes [provider]  Omega-3 Fatty Acids (FISH OIL PO) Take by mouth.   Yes  [provider]  VITAMIN E PO Take 1 tablet by mouth daily.   Yes [provider]  zinc gluconate 50 MG tablet Take 50 mg by mouth daily.   Yes [provider]     Critical care time: 95 m       Cyril Mourning MD. FCCP. [place] Pager : [phone]  If no response to pager , please call [phone] until 7 pm After 7:00 pm call Elink  [phone]   03/27/2023

## 2023-03-27 NOTE — Progress Notes (Signed)
PHARMACY - PHYSICIAN COMMUNICATION CRITICAL VALUE ALERT - BLOOD CULTURE IDENTIFICATION (BCID)  Melanie Madden is an 57 y.o. female who presented to [facility] on 03/26/2023 with a chief complaint of shock with bactermia  Assessment: Micro called with BCID>>3/4 bottles with e.coli without resistance genes detected.   Name of physician (or Provider) Contacted: Dr Isaiah Serge  Current antibiotics: Ceftriaxone  Changes to prescribed antibiotics recommended:  Cont ceftriaxone 2g IV q24  Results for orders placed or performed during the hospital encounter of 03/26/23  Blood Culture ID Panel (Reflexed) (Collected: 03/26/2023 11:38 PM)  Result Value Ref Range   Enterococcus faecalis NOT DETECTED NOT DETECTED   Enterococcus Faecium NOT DETECTED NOT DETECTED   Listeria monocytogenes NOT DETECTED NOT DETECTED   Staphylococcus species NOT DETECTED NOT DETECTED   Staphylococcus aureus (BCID) NOT DETECTED NOT DETECTED   Staphylococcus epidermidis NOT DETECTED NOT DETECTED   Staphylococcus lugdunensis NOT DETECTED NOT DETECTED   Streptococcus species NOT DETECTED NOT DETECTED   Streptococcus agalactiae NOT DETECTED NOT DETECTED   Streptococcus pneumoniae NOT DETECTED NOT DETECTED   Streptococcus pyogenes NOT DETECTED NOT DETECTED   A.calcoaceticus-baumannii NOT DETECTED NOT DETECTED   Bacteroides fragilis NOT DETECTED NOT DETECTED   Enterobacterales DETECTED (A) NOT DETECTED   Enterobacter cloacae complex NOT DETECTED NOT DETECTED   Escherichia coli DETECTED (A) NOT DETECTED   Klebsiella aerogenes NOT DETECTED NOT DETECTED   Klebsiella oxytoca NOT DETECTED NOT DETECTED   Klebsiella pneumoniae NOT DETECTED NOT DETECTED   Proteus species NOT DETECTED NOT DETECTED   Salmonella species NOT DETECTED NOT DETECTED   Serratia marcescens NOT DETECTED NOT DETECTED   Haemophilus influenzae NOT DETECTED NOT DETECTED   Neisseria meningitidis NOT DETECTED NOT DETECTED   Pseudomonas aeruginosa NOT  DETECTED NOT DETECTED   Stenotrophomonas maltophilia NOT DETECTED NOT DETECTED   Candida albicans NOT DETECTED NOT DETECTED   Candida auris NOT DETECTED NOT DETECTED   Candida glabrata NOT DETECTED NOT DETECTED   Candida krusei NOT DETECTED NOT DETECTED   Candida parapsilosis NOT DETECTED NOT DETECTED   Candida tropicalis NOT DETECTED NOT DETECTED   Cryptococcus neoformans/gattii NOT DETECTED NOT DETECTED   CTX-M ESBL NOT DETECTED NOT DETECTED   Carbapenem resistance IMP NOT DETECTED NOT DETECTED   Carbapenem resistance KPC NOT DETECTED NOT DETECTED   Carbapenem resistance NDM NOT DETECTED NOT DETECTED   Carbapenem resist OXA 48 LIKE NOT DETECTED NOT DETECTED   Carbapenem resistance VIM NOT DETECTED NOT DETECTED   Ulyses Southward, PharmD, BCIDP, AAHIVP, CPP Infectious Disease Pharmacist 03/27/2023 4:26 PM

## 2023-03-27 NOTE — Progress Notes (Incomplete)
Attending note: I have seen and examined the patient. History, labs and imaging reviewed.  57 Y/O with H/O CVA, neprholithiasis. Admitted with Urosepsis and hydropnehrosis s/p right perc nephrostomy tube   Blood pressure 97/72, pulse 97, temperature 100.2 F (37.9 C), resp. rate 19, weight 74.2 kg, SpO2 98%. Gen:      No acute distress HEENT:  EOMI, sclera anicteric Neck:     No masses; no thyromegaly Lungs:    Clear to auscultation bilaterally; normal respiratory effort*** CV:         Regular rate and rhythm; no murmurs Abd:      + bowel sounds; soft, non-tender; no palpable masses, no distension Ext:    No edema; adequate peripheral perfusion Skin:      Warm and dry; no rash Neuro: alert and oriented x 3 Psych: normal mood and affect   Labs/Imaging personally reviewed, significant for WBC count increased to 43.3, Hb 10.3 Na 134, K 2.9 BUN/Cr 29/1.90 Lactic acid improved to 3.7  Assessment/plan:  The patient is critically ill with multiple organ systems failure and requires high complexity decision making for assessment and support, frequent evaluation and titration of therapies, application of advanced monitoring technologies and extensive interpretation of multiple databases.  Critical care time - 35 mins. This represents my time independent of the NPs time taking care of the pt.  Chilton Greathouse MD [place] 03/27/2023, 10:01 AM

## 2023-03-28 DIAGNOSIS — R6521 Severe sepsis with septic shock: Secondary | ICD-10-CM | POA: Diagnosis not present

## 2023-03-28 DIAGNOSIS — A419 Sepsis, unspecified organism: Secondary | ICD-10-CM | POA: Diagnosis not present

## 2023-03-28 LAB — BASIC METABOLIC PANEL
Anion gap: 11 (ref 5–15)
BUN: 25 mg/dL — ABNORMAL HIGH (ref 6–20)
CO2: 21 mmol/L — ABNORMAL LOW (ref 22–32)
Calcium: 8.7 mg/dL — ABNORMAL LOW (ref 8.9–10.3)
Chloride: 104 mmol/L (ref 98–111)
Creatinine, Ser: 1.38 mg/dL — ABNORMAL HIGH (ref 0.44–1.00)
GFR, Estimated: 45 mL/min — ABNORMAL LOW (ref >60)
Glucose, Bld: 135 mg/dL — ABNORMAL HIGH (ref 70–99)
Potassium: 4 mmol/L (ref 3.5–5.1)
Sodium: 136 mmol/L (ref 135–145)

## 2023-03-28 LAB — CBC
HCT: 29.2 % — ABNORMAL LOW (ref 36.0–46.0)
Hemoglobin: 9.5 g/dL — ABNORMAL LOW (ref 12.0–15.0)
MCH: 28.1 pg (ref 26.0–34.0)
MCHC: 32.5 g/dL (ref 30.0–36.0)
MCV: 86.4 fL (ref 80.0–100.0)
Platelets: 143 10*3/uL — ABNORMAL LOW (ref 150–400)
RBC: 3.38 MIL/uL — ABNORMAL LOW (ref 3.87–5.11)
RDW: 15 % (ref 11.5–15.5)
WBC: 15.4 10*3/uL — ABNORMAL HIGH (ref 4.0–10.5)
nRBC: 0 % (ref 0.0–0.2)

## 2023-03-28 LAB — LACTIC ACID, PLASMA: Lactic Acid, Venous: 2.1 mmol/L (ref 0.5–1.9)

## 2023-03-28 LAB — GLUCOSE, CAPILLARY
Glucose-Capillary: 152 mg/dL — ABNORMAL HIGH (ref 70–99)
Glucose-Capillary: 155 mg/dL — ABNORMAL HIGH (ref 70–99)
Glucose-Capillary: 83 mg/dL (ref 70–99)
Glucose-Capillary: 181 mg/dL — ABNORMAL HIGH (ref 70–99)

## 2023-03-28 LAB — MAGNESIUM: Magnesium: 2.3 mg/dL (ref 1.7–2.4)

## 2023-03-28 MED ORDER — SODIUM CHLORIDE 0.9% FLUSH
5.0000 mL | Freq: Three times a day (TID) | INTRAVENOUS | Status: DC
  Administered 2023-03-29 – 2023-03-31 (×10): 5 mL

## 2023-03-28 NOTE — Progress Notes (Addendum)
Pt admitted to rm 23 from 48M with foley cath and R nephrostomy tube. Per report, pt got foley cath for strict I & o by urologist. Initiated tele. Oriented pt to the unit. Call bell at the bedside. Floor matt initiated. Obtained vs.   Lawson Radar, RN

## 2023-03-28 NOTE — Progress Notes (Signed)
Patient transferred to [address] 23 via bed. VSS. Remains quite agitated.

## 2023-03-28 NOTE — Progress Notes (Signed)
Referring Physician(s): Dr Tressie Stalker  Supervising Physician: Roanna Banning  Patient Status:  [facility] - In-pt  Chief Complaint:  Urosepsis/renal calculus  Subjective:  R PCN placed in IR 8/28 Hx CVA Aphasic Up in bed Family at bedside  Creatinine 1.3 today UOP great; yellow Site c/d/i   Allergies: Amoxicillin and Shellfish allergy  Medications: Prior to Admission medications   Medication Sig Start Date End Date Taking? Authorizing Provider  amLODipine (NORVASC) 10 MG tablet Take 10 mg by mouth daily.   Yes [provider]  atorvastatin (LIPITOR) 20 MG tablet Take 1 tablet (20 mg total) by mouth daily at 6 PM. Patient taking differently: Take 20 mg by mouth daily. 08/20/19  Yes Angiulli, Mcarthur Rossetti, PA-C  B Complex-C (B-COMPLEX WITH VITAMIN C) tablet Take 1 tablet by mouth daily.   Yes [provider]  Bisacodyl ([employer] C-LAX LAXATIVE PO) Take by mouth as needed (constiipation).   Yes [provider]  CALCIUM PO Take 1 tablet by mouth daily.   Yes [provider]  Cholecalciferol (VITAMIN D) 50 MCG (2000 UT) CAPS Take 2,000 Units by mouth daily.   Yes [provider]  ferrous sulfate 325 (65 FE) MG EC tablet Take 325 mg by mouth daily with breakfast.   Yes [provider]  gabapentin (NEURONTIN) 100 MG capsule 100 mg at bedtime. 02/27/23  Yes [provider]  magnesium 30 MG tablet Take 30 mg by mouth daily.   Yes [provider]  Omega-3 Fatty Acids (FISH OIL PO) Take by mouth.   Yes [provider]  VITAMIN E PO Take 1 tablet by mouth daily.   Yes [provider]  zinc gluconate 50 MG tablet Take 50 mg by mouth daily.   Yes [provider]     Vital Signs: BP 111/76   Pulse (!) 101   Temp 99.3 F (37.4 C)   Resp (!) 26   Wt 162 lb 0.6 oz (73.5 kg)   SpO2 95%   BMI 25.38 kg/m   Physical Exam Vitals reviewed.  Skin:    General: Skin is warm.     Comments: R PCN  site c/d/I UOP great Yellow   Neurological:     Mental Status: She is alert.     Imaging: IR NEPHROSTOMY PLACEMENT RIGHT  Result Date: 03/27/2023 INDICATION: 57 year old female with history of nephrolithiasis presenting with obstructive right ureteropelvic junction renal calculus complicated by urosepsis. EXAM: 1. ULTRASOUND GUIDANCE FOR PUNCTURE OF THE RIGHT RENAL COLLECTING SYSTEM 2. RIGHT PERCUTANEOUS NEPHROSTOMY TUBE PLACEMENT. COMPARISON:  None Available. MEDICATIONS: The patient was receiving intravenous Rocephin as an inpatient, no additional antibiotics were administered. ANESTHESIA/SEDATION: Moderate (conscious) sedation was employed during this procedure. A total of Versed 1 mg and Fentanyl 25 mcg was administered intravenously. Moderate Sedation Time: 13 minutes. The patient's level of consciousness and vital signs were monitored continuously by radiology nursing throughout the procedure under my direct supervision. CONTRAST:  Two mL Isovue 300 - administered into the renal collecting system FLUOROSCOPY TIME:  Six mGy COMPLICATIONS: None immediate. PROCEDURE: The procedure, risks, benefits, and alternatives were explained to the patient. Questions regarding the procedure were encouraged and answered. The patient understands and consents to the procedure. A timeout was performed prior to the initiation of the procedure. The right flank region was prepped and draped in the usual sterile fashion and a sterile drape was applied covering the operative field. A sterile gown and sterile gloves were used for the  procedure. Local anesthesia was provided with 1% Lidocaine with epinephrine. Ultrasound was used to localize the right kidney. Under direct ultrasound guidance, a 21 gauge needle was advanced into the renal collecting system. An ultrasound image documentation was performed. Access within the collecting system was confirmed with the efflux of purulent urine. Over a Nitrex wire, the tract was  dilated with an Accustick stent. Next, under intermittent fluoroscpic guidance and over a short Amplatz wire, the track was dilated ultimately allowing placement of a 10.2-French percutaneous nephrostomy catheter which was advanced to the level of the renal pelvis where the coil was formed and locked. Minimal contrast was injected and fluoroscopic images were obtained, confirming pigtail location within the renal pelvis. Aspiration was performed which yielded approximately 100 mL of purulent, thick fluid. Sample sent to the lab for culture. The catheter was secured at the skin with a 0 silk retention suture and stat lock device and connected to a gravity bag was placed. Dressings were applied. The patient tolerated procedure well without immediate postprocedural complication. FINDINGS: Ultrasound scanning demonstrates a moderate to moderately dilated right collecting system with obstructive ureteropelvic calculus. Under a combination of ultrasound and fluoroscopic guidance, a posterior inferior calix was targeted allowing placement of a 10.2-French percutaneous nephrostomy catheter with end coiled and locked within the renal pelvis. Contrast injection confirmed appropriate positioning. IMPRESSION: Successful ultrasound and fluoroscopic guided placement of a right sided 10.2 French percutaneous nephrostomy. Marliss Coots, MD [facility] Radiology Electronically Signed   By: Marliss Coots M.D.   On: 03/27/2023 08:26   CT Renal Stone Study  Result Date: 03/27/2023 CLINICAL DATA:  Abdominal pain.  Concern for kidney stone. EXAM: CT ABDOMEN AND PELVIS WITHOUT CONTRAST TECHNIQUE: Multidetector CT imaging of the abdomen and pelvis was performed following the standard protocol without IV contrast. RADIATION DOSE REDUCTION: This exam was performed according to the departmental dose-optimization program which includes automated exposure control, adjustment of the mA and/or  kV according to patient size and/or use of iterative reconstruction technique. COMPARISON:  CT abdomen pelvis dated 01/21/2023. FINDINGS: Evaluation of this exam is limited in the absence of intravenous contrast. Lower chest: The visualized lung bases are clear. No intra-abdominal free air.  Trace free fluid in the pelvis. Hepatobiliary: The liver is unremarkable. No biliary dilatation. The gallbladder is unremarkable. Pancreas: Unremarkable. No pancreatic ductal dilatation or surrounding inflammatory changes. Spleen: Normal in size without focal abnormality. Adrenals/Urinary Tract: The right adrenal glands unremarkable. There is a 4.4 cm left adrenal adenoma similar to prior CT. There is a 15 mm stone at the right ureteropelvic junction with mild-to-moderate right hydronephrosis. Multiple additional nonobstructing bilateral renal calculi noted. There is no hydronephrosis on the left. There is mild left renal parenchyma atrophy. The visualized ureters appear unremarkable. The Foley catheter is noted within the urinary bladder. The urinary bladder is minimally distended. Air within the bladder introduced via the catheter. Stomach/Bowel: Mild sigmoid diverticulosis without active inflammatory changes. There is no bowel obstruction or active inflammation. Appendectomy. Vascular/Lymphatic: Mild atherosclerotic calcification of the abdominal aorta. The IVC is unremarkable. No portal venous gas. There is no adenopathy. Reproductive: Hysterectomy.  No adnexal masses. Other: None Musculoskeletal: Degenerative changes.  No acute osseous pathology. IMPRESSION: 1. A 15 mm right UPJ stone with mild-to-moderate right hydronephrosis. 2. Multiple additional nonobstructing bilateral renal calculi. 3. Stable left adrenal adenoma. 4. Mild sigmoid diverticulosis. No bowel obstruction. 5.  Aortic Atherosclerosis (ICD10-I70.0). Electronically Signed   By: Ceasar Mons.D.  On: 03/27/2023 01:24   DG Chest Portable 1  View  Result Date: 03/27/2023 CLINICAL DATA:  Sepsis, weakness. EXAM: PORTABLE CHEST 1 VIEW COMPARISON:  12/09/2020. FINDINGS: The heart size and mediastinal contours are within normal limits. There is atherosclerotic calcification of the aorta. Lung volumes are low with mild atelectasis at the left lung base. No effusion or pneumothorax. No acute osseous abnormality. IMPRESSION: Mild atelectasis at the left lung base. Electronically Signed   By: Thornell Sartorius M.D.   On: 03/27/2023 00:24   DG Foot Complete Right  Result Date: 03/26/2023 CLINICAL DATA:  Right foot and ankle swelling, initial encounter EXAM: RIGHT FOOT COMPLETE - 3+ VIEW COMPARISON:  None Available. FINDINGS: Mild hallux valgus deformity is noted. Deformity in the distal first metatarsal is noted likely related to prior surgery. No acute fracture or dislocation is noted. No soft tissue abnormality is seen. IMPRESSION: Chronic changes without acute abnormality. Electronically Signed   By: Alcide Clever M.D.   On: 03/26/2023 21:01    Labs:  CBC: Recent Labs    01/21/23 1328 03/26/23 2039 03/27/23 0537 03/28/23 1022  WBC 6.1 7.7 43.3* 15.4*  HGB 14.3 13.4 10.3* 9.5*  HCT 42.9 40.8 30.2* 29.2*  PLT 202 254 230 143*    COAGS: Recent Labs    03/26/23 2336  INR 1.2  APTT 30    BMP: Recent Labs    01/21/23 1328 03/26/23 2039 03/27/23 0537 03/28/23 0629  NA 137 133* 134* 136  K 3.6 3.0* 2.9* 4.0  CL 103 97* 104 104  CO2 26 15* 18* 21*  GLUCOSE 120* 217* 234* 135*  BUN 22* 30* 29* 25*  CALCIUM 9.7 10.1 8.7* 8.7*  CREATININE 0.96 2.18* 1.90* 1.38*  GFRNONAA >60 26* 30* 45*    LIVER FUNCTION TESTS: Recent Labs    01/16/23 1237 01/21/23 1328  BILITOT 0.7 0.5  AST 17 16  ALT 19 20  ALKPHOS 89 73  PROT 7.1 6.6  ALBUMIN 3.8 3.5    Drain Location: CVA right Size: Fr size: 10 Fr Date of placement: 03/27/23  Currently to: Drain collection device: gravity 24 hour output:  Output by Drain (mL) 03/26/23 0701  - 03/26/23 1900 03/26/23 1901 - 03/27/23 0700 03/27/23 0701 - 03/27/23 1900 03/27/23 1901 - 03/28/23 0700 03/28/23 0701 - 03/28/23 1104  Requested LDAs do not have output data documented.    Interval imaging/drain manipulation:  none  Current examination: Flushes/aspirates easily.  Insertion site unremarkable. Suture and stat lock in place. Dressed appropriately.  Skin site: c/d/I  IR will continue to follow - please call with questions or concerns.  Assessment and Plan:  Plan: follow with Urology  Electronically Signed: Robet Leu, PA-C 03/28/2023, 11:04 AM   I spent a total of 15 Minutes at the the patient's bedside AND on the patient's hospital floor or unit, greater than 50% of which was counseling/coordinating care for R PCN

## 2023-03-28 NOTE — Progress Notes (Signed)
NAME:  Melanie Madden, MRN:  [id number], DOB:  1966/01/19, LOS: 1 ADMISSION DATE:  03/26/2023, CONSULTATION DATE:  03/28/2023  REFERRING MD:  Bebe Shaggy EDP, CHIEF COMPLAINT: Abdominal pain  History of Present Illness:  57 year old BIBEMS from home he initially thought to have right foot pain and swelling after she hit it on something.  Husband reported worsening lower abdominal pain over the past week and abrupt onset of chills.  Initially normotensive but then developed hypotension during ER stay  , lowest blood pressure 79/60. Labs significant for lactate 5.3, no leukocytosis, BUN/creatinine 30/2.2, hypokalemia 3.0, hyponatremia 133 She was given 2 L of fluid and ceftriaxone, then started on peripheral Levophed. CT renal stone study showed 15 mm right UPJ stone with mild to moderate right hydronephrosis and multiple nonobstructing bilateral renal calculi as prior. Admitted to ICU with septic shcok.  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 On ceftriaxone. 08/29 Off levophed  Interim History / Subjective:  Feels better today, has some mild ongoing tenderness to palpation of abdomen but overall improving.  Objective   Blood pressure 95/62, pulse 88, temperature 99.1 F (37.3 C), resp. rate (!) 21, weight 73.5 kg, SpO2 97%.        Intake/Output Summary (Last 24 hours) at 03/28/2023 0806 Last data filed at 03/28/2023 0600 Gross per 24 hour  Intake 2942.18 ml  Output 2330 ml  Net 612.18 ml   Filed Weights   03/27/23 0500 03/28/23  0045 03/28/23 0242  Weight: 74.2 kg 73.5 kg 73.5 kg    Examination: Constitutional:Resting comfortably, no acute distress. Cardio:Regular rate and rhythm. No murmurs, rubs, or gallops. Pulm:Clear to auscultation bilaterally. Normal work of breathing on room air. Abdomen:Soft, mildly tender to palpation over suprapubic area. L PCT drain with clear yellow urine. YQM:VHQIONGE for extremity edema. Skin:Warm and dry. Neuro:Alert and interactive. Aphasia. R hemiparesis.  Labs WBC 15.4 (43.3), Hgb 9.5 (10.3) CO2 21 (18), BUN 25(29)/creatinine 1.38 (1.90) GFR 45 (30) Lactic acid 2.1 (2.8)  Resolved Hospital Problem list   N/A  Assessment & Plan:  Septic shock with UTI Bilateral nephrolithiasis, right UPJ stone with moderate hydronephrosis Last documented fever around 1700, 100.4. WBC much improved to 15.4 from 43.3, lactic acid nearly normal at 2.1. Intermittently tachycardic. Briefly required levophed overnight for MAP 64 but has been stable off of this today. Blood culture positive for E. Coli, urine culture is pending.  Remains on ceftriaxone.  Plan: -Continue ceftriaxone, narrow as culture susceptibilities result if needed -F/u urine culture  AKI Stable, has improved with IVF resuscitation.  Plan: -Trend renal function -Avoid contrast and other nephrotoxins  Hx CVA Plan: -Resume home gabapentin -Will need PT consult once more stable, able to stay off pressors  Hx HTN Plan: -Continue to hold home amlodipine  Best Practice (right click and "Reselect all SmartList Selections" daily)   Diet/type: Regular DVT prophylaxis: prophylactic heparin  GI prophylaxis: N/A Lines: N/A Foley:  Yes, and it is still needed Code Status:  full code Last date of multidisciplinary goals of care discussion: 08/27  Labs   CBC: Recent Labs  Lab 03/26/23 2039 03/27/23 0537  WBC 7.7 43.3*  NEUTROABS 7.3  --   HGB 13.4 10.3*  HCT 40.8 30.2*  MCV 86.6 87.3  PLT 254 230    Basic  Metabolic Panel: Recent Labs  Lab 03/26/23 2039 03/27/23 0537 03/28/23 0629  NA 133* 134* 136  K 3.0* 2.9* 4.0  CL 97* 104 104  CO2 15* 18* 21*  GLUCOSE 217* 234* 135*  BUN 30* 29* 25*  CREATININE 2.18* 1.90* 1.38*  CALCIUM 10.1 8.7* 8.7*  MG  --  1.7  --   PHOS  --  3.2  --    GFR: Estimated Creatinine Clearance: 43.7 mL/min (A) (by C-G formula based on SCr of 1.38 mg/dL (H)). Recent Labs  Lab 03/26/23 2039 03/26/23 2353 03/27/23 0537 03/27/23 0838 03/27/23 1557  WBC 7.7  --  43.3*  --   --   LATICACIDVEN  --  5.3* 3.7* 3.3* 2.8*    Liver Function Tests: No results for input(s): "AST", "ALT", "ALKPHOS", "BILITOT", "PROT", "ALBUMIN" in the last 168 hours. No results for input(s): "LIPASE", "AMYLASE" in the last 168 hours. No results for input(s): "AMMONIA" in the last 168 hours.  ABG    Component Value Date/Time   TCO2 24 07/09/2019 1956     Coagulation Profile: Recent Labs  Lab 03/26/23 2336  INR 1.2    Cardiac Enzymes: No results for input(s): "CKTOTAL", "CKMB", "CKMBINDEX", "TROPONINI" in the last 168 hours.  HbA1C: Hgb A1c MFr Bld  Date/Time Value Ref Range Status  03/27/2023 05:37 AM 5.6 4.8 - 5.6 % Final    Comment:    (NOTE) Pre diabetes:          5.7%-6.4%  Diabetes:              >6.4%  Glycemic control for   <7.0% adults with diabetes   12/09/2020 01:11 AM 5.2 4.8 - 5.6 % Final    Comment:    (NOTE) Pre diabetes:          5.7%-6.4%  Diabetes:              >6.4%  Glycemic control for   <7.0% adults with diabetes     CBG: Recent Labs  Lab 03/27/23 1127 03/27/23 1530 03/27/23 1935 03/27/23 2200 03/28/23 0747  GLUCAP 193* 196* 181* 149* 152*    Review of Systems:   As expressed by husband in HPI. Unable to obtain from patient since aphasic and not reliable  Past Medical History:  She,  has a past medical history of Allergy, Anxiety, Depression, Expressive aphasia, Hemiparesis (HCC), High triglycerides, History of kidney  stones, Kidney stones, Recurrent sinus infections, and Stroke (HCC).   Surgical History:   Past Surgical History:  Procedure Laterality Date   ANTERIOR CRUCIATE LIGAMENT REPAIR Left    BUNIONECTOMY Right    CYSTOSCOPY/URETEROSCOPY/HOLMIUM LASER/STENT PLACEMENT Right 02/08/2021   Procedure: CYSTOSCOPY/RETROGRADE/URETEROSCOPY/HOLMIUM LASER/STENT PLACEMENT, NEPHROSTOMY TUBE REMOVAL;  Surgeon: Rene Paci, MD;  Location: [facility];  Service: Urology;  Laterality: Right;   IR NEPHROSTOMY PLACEMENT RIGHT  12/09/2020   IR NEPHROSTOMY PLACEMENT RIGHT  03/27/2023   LASIK     LIPOSUCTION     LITHOTRIPSY     MENISCUS REPAIR Left    NASAL SEPTUM SURGERY     ROBOTIC ASSISTED TOTAL HYSTERECTOMY WITH BILATERAL SALPINGO OOPHERECTOMY Bilateral 12/06/2021   Procedure: XI ROBOTIC ASSISTED BILATERAL SALPINGO OOPHORECTOMY, TOTAL HYSTERECTOMY, PERITONEAL BIOPSY, OMENTECTOMY, LAPAROTOMY, APPENDECTOMY, PAP SMEAR;  Surgeon: Carver Fila, MD;  Location:  [facility];  Service: Gynecology;  Laterality: Bilateral;   SHOULDER SURGERY     VARICOSE VEIN SURGERY Left      Social History:   reports that she has quit smoking. She has quit using smokeless tobacco. She reports that she does not currently use alcohol. She reports that she does not use drugs.   Family History:  Her family history includes Alzheimer's disease in her mother; Deep vein thrombosis in her maternal grandmother; Diabetes in her mother; Hypertension in her mother; Thyroid disease in her mother. There is no history of Colon cancer, Colon polyps, Esophageal cancer, Pancreatic cancer, Rectal cancer, Stomach cancer, or Breast cancer.   Allergies Allergies  Allergen Reactions   Amoxicillin Nausea Only   Shellfish Allergy Diarrhea and Nausea And Vomiting    Mussels      Home Medications  Prior to Admission medications   Medication Sig Start Date End Date Taking? Authorizing Provider  amLODipine (NORVASC) 10 MG tablet Take 10 mg by  mouth daily.   Yes [provider]  atorvastatin (LIPITOR) 20 MG tablet Take 1 tablet (20 mg total) by mouth daily at 6 PM. Patient taking differently: Take 20 mg by mouth daily. 08/20/19  Yes Angiulli, Mcarthur Rossetti, PA-C  B Complex-C (B-COMPLEX WITH VITAMIN C) tablet Take 1 tablet by mouth daily.   Yes [provider]  Bisacodyl ([employer] C-LAX LAXATIVE PO) Take by mouth as needed (constiipation).   Yes [provider]  CALCIUM PO Take 1 tablet by mouth daily.   Yes [provider]  Cholecalciferol (VITAMIN D) 50 MCG (2000 UT) CAPS Take 2,000 Units by mouth daily.   Yes [provider]  ferrous sulfate 325 (65 FE) MG EC tablet Take 325 mg by mouth daily with breakfast.   Yes [provider]  gabapentin (NEURONTIN) 100 MG capsule 100 mg at bedtime. 02/27/23  Yes [provider]  magnesium 30 MG tablet Take 30 mg by mouth daily.   Yes [provider]  Omega-3 Fatty Acids (FISH OIL PO) Take by mouth.   Yes [provider]  VITAMIN E PO Take 1 tablet by mouth daily.   Yes [provider]  zinc gluconate 50 MG tablet Take 50 mg by mouth daily.   Yes [provider]     Critical care time: 30 min       Champ Mungo, DO Internal Medicine PGY-3  03/28/2023

## 2023-03-28 NOTE — Progress Notes (Signed)
Subjective: Melanie Madden. First time meeting pt. Non-verbal d/t CVA. Nods appropriately and expressed understanding. Slightly hypotensive again overnight, restarted Levo. Updated husband when he arrived at bedside.   Objective: Vital signs in last 24 hours: Temp:  [99 F (37.2 C)-100.6 F (38.1 C)] 99.3 F (37.4 C) (08/29 0915) Pulse Rate:  [87-108] 101 (08/29 0915) Resp:  [14-38] 25 (08/29 0915) BP: (80-115)/(54-90) 102/59 (08/29 0915) SpO2:  [87 %-100 %] 96 % (08/29 0915) Weight:  [73.5 kg] 73.5 kg (08/29 0242)  Assessment/Plan: #urosepsis #ureteral obstruction #AKI  15mm obstructing right UPJ stone with associated hydronephrosis. S/p right percutaneous nephrostomy Profound leukocytosis.  7.7--> 43.3.  Despite this, vasopressor requirements are minimal and patient looks much better on rounds. Repeat lactic acid pending. Cultures pending.  Broad-spectrum ABX per critical care team. Outpatient follow-up with Dr. Liliane Shi has been scheduled.  Definitive stone management on an outpatient basis once she is clear of infection and hemodynamically stable. Will follow along peripherally through the weekend. I will check on her again at the beginning of the week. Please call with questions.   Intake/Output from previous day: 08/28 0701 - 08/29 0700 In: 3098.8 [P.O.:720; I.V.:940; IV Piggyback:1428.8] Out: 2630 [Urine:2630]  Intake/Output this shift: Total I/O In: 465.1 [P.O.:450; I.V.:15.1] Out: 100 [Urine:100]  Physical Exam:  General: Alert and oriented CV: No cyanosis Lungs: equal chest rise Gu: Right percutaneous nephrostomy tube in place draining clear yellow urine. Foley in place draining clear yellow urine.  Lab Results: Recent Labs    03/26/23 2039 03/27/23 0537  HGB 13.4 10.3*  HCT 40.8 30.2*   BMET Recent Labs    03/27/23 0537 03/28/23 0629  NA 134* 136  K 2.9* 4.0  CL 104 104  CO2 18* 21*  GLUCOSE 234* 135*  BUN 29* 25*  CREATININE 1.90* 1.38*  CALCIUM  8.7* 8.7*     Studies/Results: IR NEPHROSTOMY PLACEMENT RIGHT  Result Date: 03/27/2023 INDICATION: 57 year old female with history of nephrolithiasis presenting with obstructive right ureteropelvic junction renal calculus complicated by urosepsis. EXAM: 1. ULTRASOUND GUIDANCE FOR PUNCTURE OF THE RIGHT RENAL COLLECTING SYSTEM 2. RIGHT PERCUTANEOUS NEPHROSTOMY TUBE PLACEMENT. COMPARISON:  None Available. MEDICATIONS: The patient was receiving intravenous Rocephin as an inpatient, no additional antibiotics were administered. ANESTHESIA/SEDATION: Moderate (conscious) sedation was employed during this procedure. A total of Versed 1 mg and Fentanyl 25 mcg was administered intravenously. Moderate Sedation Time: 13 minutes. The patient's level of consciousness and vital signs were monitored continuously by radiology nursing throughout the procedure under my direct supervision. CONTRAST:  Two mL Isovue 300 - administered into the renal collecting system FLUOROSCOPY TIME:  Six mGy COMPLICATIONS: None immediate. PROCEDURE: The procedure, risks, benefits, and alternatives were explained to the patient. Questions regarding the procedure were encouraged and answered. The patient understands and consents to the procedure. A timeout was performed prior to the initiation of the procedure. The right flank region was prepped and draped in the usual sterile fashion and a sterile drape was applied covering the operative field. A sterile gown and sterile gloves were used for the procedure. Local anesthesia was provided with 1% Lidocaine with epinephrine. Ultrasound was used to localize the right kidney. Under direct ultrasound guidance, a 21 gauge needle was advanced into the renal collecting system. An ultrasound image documentation was performed. Access within the collecting system was confirmed with the efflux of purulent urine. Over a Nitrex wire, the tract was dilated with an Accustick stent. Next, under intermittent  fluoroscpic guidance and over  a short Amplatz wire, the track was dilated ultimately allowing placement of a 10.2-French percutaneous nephrostomy catheter which was advanced to the level of the renal pelvis where the coil was formed and locked. Minimal contrast was injected and fluoroscopic images were obtained, confirming pigtail location within the renal pelvis. Aspiration was performed which yielded approximately 100 mL of purulent, thick fluid. Sample sent to the lab for culture. The catheter was secured at the skin with a 0 silk retention suture and stat lock device and connected to a gravity bag was placed. Dressings were applied. The patient tolerated procedure well without immediate postprocedural complication. FINDINGS: Ultrasound scanning demonstrates a moderate to moderately dilated right collecting system with obstructive ureteropelvic calculus. Under a combination of ultrasound and fluoroscopic guidance, a posterior inferior calix was targeted allowing placement of a 10.2-French percutaneous nephrostomy catheter with end coiled and locked within the renal pelvis. Contrast injection confirmed appropriate positioning. IMPRESSION: Successful ultrasound and fluoroscopic guided placement of a right sided 10.2 French percutaneous nephrostomy. Marliss Coots, MD [facility] Electronically Signed   By: Marliss Coots M.D.   On: 03/27/2023 08:26   CT Renal Stone Study  Result Date: 03/27/2023 CLINICAL DATA:  Abdominal pain.  Concern for kidney stone. EXAM: CT ABDOMEN AND PELVIS WITHOUT CONTRAST TECHNIQUE: Multidetector CT imaging of the abdomen and pelvis was performed following the standard protocol without IV contrast. RADIATION DOSE REDUCTION: This exam was performed according to the departmental dose-optimization program which includes automated exposure control, adjustment of the mA and/or kV according to patient size and/or use of iterative  reconstruction technique. COMPARISON:  CT abdomen pelvis dated 01/21/2023. FINDINGS: Evaluation of this exam is limited in the absence of intravenous contrast. Lower chest: The visualized lung bases are clear. No intra-abdominal free air.  Trace free fluid in the pelvis. Hepatobiliary: The liver is unremarkable. No biliary dilatation. The gallbladder is unremarkable. Pancreas: Unremarkable. No pancreatic ductal dilatation or surrounding inflammatory changes. Spleen: Normal in size without focal abnormality. Adrenals/Urinary Tract: The right adrenal glands unremarkable. There is a 4.4 cm left adrenal adenoma similar to prior CT. There is a 15 mm stone at the right ureteropelvic junction with mild-to-moderate right hydronephrosis. Multiple additional nonobstructing bilateral renal calculi noted. There is no hydronephrosis on the left. There is mild left renal parenchyma atrophy. The visualized ureters appear unremarkable. The Foley catheter is noted within the urinary bladder. The urinary bladder is minimally distended. Air within the bladder introduced via the catheter. Stomach/Bowel: Mild sigmoid diverticulosis without active inflammatory changes. There is no bowel obstruction or active inflammation. Appendectomy. Vascular/Lymphatic: Mild atherosclerotic calcification of the abdominal aorta. The IVC is unremarkable. No portal venous gas. There is no adenopathy. Reproductive: Hysterectomy.  No adnexal masses. Other: None Musculoskeletal: Degenerative changes.  No acute osseous pathology. IMPRESSION: 1. A 15 mm right UPJ stone with mild-to-moderate right hydronephrosis. 2. Multiple additional nonobstructing bilateral renal calculi. 3. Stable left adrenal adenoma. 4. Mild sigmoid diverticulosis. No bowel obstruction. 5.  Aortic Atherosclerosis (ICD10-I70.0). Electronically Signed   By: Elgie Collard M.D.   On: 03/27/2023 01:24   DG Chest Portable 1 View  Result Date: 03/27/2023 CLINICAL DATA:  Sepsis, weakness.  EXAM: PORTABLE CHEST 1 VIEW COMPARISON:  12/09/2020. FINDINGS: The heart size and mediastinal contours are within normal limits. There is atherosclerotic calcification of the aorta. Lung volumes are low with mild atelectasis at the left lung base. No effusion or pneumothorax. No acute osseous abnormality. IMPRESSION: Mild atelectasis at the left  lung base. Electronically Signed   By: Thornell Sartorius M.D.   On: 03/27/2023 00:24   DG Foot Complete Right  Result Date: 03/26/2023 CLINICAL DATA:  Right foot and ankle swelling, initial encounter EXAM: RIGHT FOOT COMPLETE - 3+ VIEW COMPARISON:  None Available. FINDINGS: Mild hallux valgus deformity is noted. Deformity in the distal first metatarsal is noted likely related to prior surgery. No acute fracture or dislocation is noted. No soft tissue abnormality is seen. IMPRESSION: Chronic changes without acute abnormality. Electronically Signed   By: Alcide Clever M.D.   On: 03/26/2023 21:01      LOS: 1 day   Elmon Kirschner, NP [facility] Pager: [phone]  03/28/2023, 9:29 AM

## 2023-03-28 NOTE — Plan of Care (Signed)
Pt alert and responsive, afebrile. Levophed restarted to maintain map >65 mmhg.. Nephrostomy output pink yellow urine patent and intact. Dressing c/d/I. Meds and IVF as ordered. Denies any c/o pain or discomfort. Foley patent and intact draining to gravity clear yellow urine. No s/s of distress at this time will continue to monitor.

## 2023-03-29 DIAGNOSIS — R579 Shock, unspecified: Secondary | ICD-10-CM | POA: Diagnosis not present

## 2023-03-29 DIAGNOSIS — N179 Acute kidney failure, unspecified: Secondary | ICD-10-CM | POA: Diagnosis not present

## 2023-03-29 DIAGNOSIS — N1330 Unspecified hydronephrosis: Secondary | ICD-10-CM | POA: Diagnosis present

## 2023-03-29 DIAGNOSIS — A419 Sepsis, unspecified organism: Secondary | ICD-10-CM | POA: Diagnosis not present

## 2023-03-29 DIAGNOSIS — N201 Calculus of ureter: Secondary | ICD-10-CM | POA: Diagnosis present

## 2023-03-29 DIAGNOSIS — R7881 Bacteremia: Secondary | ICD-10-CM | POA: Diagnosis present

## 2023-03-29 DIAGNOSIS — I639 Cerebral infarction, unspecified: Secondary | ICD-10-CM | POA: Diagnosis present

## 2023-03-29 DIAGNOSIS — Q6211 Congenital occlusion of ureteropelvic junction: Secondary | ICD-10-CM

## 2023-03-29 LAB — GLUCOSE, CAPILLARY
Glucose-Capillary: 110 mg/dL — ABNORMAL HIGH (ref 70–99)
Glucose-Capillary: 99 mg/dL (ref 70–99)
Glucose-Capillary: 90 mg/dL (ref 70–99)
Glucose-Capillary: 218 mg/dL — ABNORMAL HIGH (ref 70–99)
Glucose-Capillary: 100 mg/dL — ABNORMAL HIGH (ref 70–99)

## 2023-03-29 LAB — BASIC METABOLIC PANEL
Anion gap: 7 (ref 5–15)
BUN: 18 mg/dL (ref 6–20)
CO2: 23 mmol/L (ref 22–32)
Calcium: 8.7 mg/dL — ABNORMAL LOW (ref 8.9–10.3)
Chloride: 109 mmol/L (ref 98–111)
Creatinine, Ser: 1.1 mg/dL — ABNORMAL HIGH (ref 0.44–1.00)
GFR, Estimated: 59 mL/min — ABNORMAL LOW (ref >60)
Glucose, Bld: 114 mg/dL — ABNORMAL HIGH (ref 70–99)
Potassium: 3.9 mmol/L (ref 3.5–5.1)
Sodium: 139 mmol/L (ref 135–145)

## 2023-03-29 LAB — CBC
HCT: 30.1 % — ABNORMAL LOW (ref 36.0–46.0)
Hemoglobin: 9.8 g/dL — ABNORMAL LOW (ref 12.0–15.0)
MCH: 28.5 pg (ref 26.0–34.0)
MCHC: 32.6 g/dL (ref 30.0–36.0)
MCV: 87.5 fL (ref 80.0–100.0)
Platelets: 124 10*3/uL — ABNORMAL LOW (ref 150–400)
RBC: 3.44 MIL/uL — ABNORMAL LOW (ref 3.87–5.11)
RDW: 14.7 % (ref 11.5–15.5)
WBC: 9.2 10*3/uL (ref 4.0–10.5)
nRBC: 0 % (ref 0.0–0.2)

## 2023-03-29 LAB — CULTURE, BLOOD (ROUTINE X 2)

## 2023-03-29 LAB — URINE CULTURE: Culture: >=100000 — AB

## 2023-03-29 NOTE — Plan of Care (Signed)
  Problem: Education: Goal: Knowledge of General Education information will improve Description Including pain rating scale, medication(s)/side effects and non-pharmacologic comfort measures Outcome: Progressing   

## 2023-03-29 NOTE — Progress Notes (Signed)
[facility]                                                                              Melanie Madden, is a 57 y.o. 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 coli UTI with obstructing right renal stone, appendiceal adenocarcinoma stage IV, diagnosed on TAH/BSO in 11/2021 presented from home with right foot pain and swelling after she hit it on something.  Patient also had lower abdominal pain worsening over the past week and chills.  Initially normotensive, then developed hypotension in the ED, lowest BP 79/60, lactate 5.3, creatinine 2.2, hypokalemia, hyponatremia 133. She was placed on IV fluids and peripheral Levophed.  CT renal stone study showed 15 mm right UPJ stone with mild to moderate right hydronephrosis and multiple nonobstructing bilateral renal calculi as prior. Admitted to ICU with septic shcok.   Significant Hospital Events:   08/27 Admitted, on levophed 08/28 Blood culture positive for e coli. On ceftriaxone. 08/29 Off levophed 8/30: Transferred to the floor and [facility] assumed care  Assessment & Plan    Principal problem Septic shock with UTI, POA  Bilateral nephrolithiasis, right UPJ stone with moderate hydronephrosis E. coli UTI, E. coli bacteremia -Off the vasopressors -Urine culture, blood cultures positive for E. Coli -Continue IV Rocephin -Right-sided PCN placed in ER on 8/28, urology following.Marland Kitchen  Definitive stone management on outpatient basis once cleared of infection and hemodynamically stable. -Sepsis physiology improved, leukocytosis, lactic acidosis, AKI improving -Will  need 14 days of antibiotics, will transition to oral antibiotics upon discharge.   Acute kidney injury -Baseline creatinine 0.9, presented with creatinine of 2.1 with hyponatremia, hypokalemia, anion gap metabolic acidosis -Creatinine now improving 1.1   History of CVA, hemorrhagic stroke in 2020 with residual aphasia and right-sided hemiparesis -Continue home gabapentin -PT OT evaluation   Essential hypertension -BP stable   Estimated body mass index is 26.35 kg/m as calculated from the following:   Height as of 01/21/23: 5\' 7"  (1.702 m).   Weight as of this encounter: 76.3 kg.  Code Status: Full code DVT Prophylaxis:  heparin injection 5,000 Units Start: 03/27/23 1400   Level of Care: Level of care: Progressive Family Communication:  Disposition Plan:      Remains inpatient appropriate: PT OT evaluation pending, hopefully DC home in 24 to 48 hours   Procedures:  R PCN placed in IR 8/28   Consultants:   Interventional radiology [facility]  Antimicrobials:   Anti-infectives (From admission, onward)    Start     Dose/Rate Route Frequency Ordered Stop   03/26/23 2345  cefTRIAXone (ROCEPHIN) 2 g in sodium chloride 0.9 % 100 mL IVPB        2 g 200 mL/hr over  30 Minutes Intravenous Every 24 hours 03/26/23 2336 04/03/23 2359          Medications  Chlorhexidine Gluconate Cloth  6 each Topical Q0600   gabapentin  100 mg Oral QHS   heparin  5,000 Units Subcutaneous Q8H   insulin aspart  0-20 Units Subcutaneous TID WC   insulin aspart  0-5 Units Subcutaneous QHS   sodium chloride flush  5 mL Intracatheter Q8H      Subjective:   Blanchard Mane Melanie Madden was seen and examined today.  Global aphasia, alert and awake however answers yes or no.  Denies any specific pain, appears comfortable.  Overnight no acute issues.  No fever chills, nausea vomiting, abdominal pain.  Objective:   Vitals:   03/28/23 2334 03/29/23 0345 03/29/23 0500 03/29/23 0926  BP:  123/72 131/82  126/80  Pulse: 97 93  95  Resp: 20 20  20   Temp: 99 F (37.2 C) 99 F (37.2 C)    TempSrc: Axillary Axillary  Oral  SpO2: 97% 97%  95%  Weight:   76.3 kg     Intake/Output Summary (Last 24 hours) at 03/29/2023 1240 Last data filed at 03/29/2023 1155 Gross per 24 hour  Intake 151.82 ml  Output 4305 ml  Net -4153.18 ml     Wt Readings from Last 3 Encounters:  03/29/23 76.3 kg  01/21/23 63 kg  01/16/23 60 kg     Exam General: Alert and awake, NAD, appears comfortable Cardiovascular: S1 S2 auscultated,  RRR Respiratory: Clear to auscultation bilaterally, no wheezing Gastrointestinal: Patient refused exam Ext: no pedal edema bilaterally Neuro: right-sided hemiparesis Psych: aphasia    Data Reviewed:  I have personally reviewed following labs    CBC Lab Results  Component Value Date   WBC 9.2 03/29/2023   RBC 3.44 (L) 03/29/2023   HGB 9.8 (L) 03/29/2023   HCT 30.1 (L) 03/29/2023   MCV 87.5 03/29/2023   MCH 28.5 03/29/2023   PLT 124 (L) 03/29/2023   MCHC 32.6 03/29/2023   RDW 14.7 03/29/2023   LYMPHSABS 0.3 (L) 03/26/2023   MONOABS 0.1 03/26/2023   EOSABS 0.0 03/26/2023   BASOSABS 0.0 03/26/2023     Last metabolic panel Lab Results  Component Value Date   NA 139 03/29/2023   K 3.9 03/29/2023   CL 109 03/29/2023   CO2 23 03/29/2023   BUN 18 03/29/2023   CREATININE 1.10 (H) 03/29/2023   GLUCOSE 114 (H) 03/29/2023   GFRNONAA 59 (L) 03/29/2023   GFRAA >60 12/19/2019   CALCIUM 8.7 (L) 03/29/2023   PHOS 3.2 03/27/2023   PROT 6.6 01/21/2023   ALBUMIN 3.5 01/21/2023   BILITOT 0.5 01/21/2023   ALKPHOS 73 01/21/2023   AST 16 01/21/2023   ALT 20 01/21/2023   ANIONGAP 7 03/29/2023    CBG (last 3)  Recent Labs    03/28/23 1815 03/28/23 2204 03/29/23 0606  GLUCAP 83 181* 110*      Coagulation Profile: Recent Labs  Lab 03/26/23 2336  INR 1.2     Radiology Studies: I have personally reviewed the imaging studies  No results  found.     Thad Ranger M.D. [facility] 03/29/2023, 12:40 PM  Available via Epic secure chat 7am-7pm After 7 pm, please refer to night coverage provider listed on amion.

## 2023-03-29 NOTE — Progress Notes (Signed)
PT Cancellation Note  Patient Details Name: Melanie Madden MRN: [id number] DOB: [birth year]   Cancelled Treatment:    Reason Eval/Treat Not Completed: Patient declined, no reason specified. 03/29/2023  Jacinto Halim., PT Acute Rehabilitation Services [phone]  (office)   Eliseo Gum Robson Trickey 03/29/2023, 4:51 PM

## 2023-03-29 NOTE — TOC CM/SW Note (Signed)
Transition of Care [facility]) - Inpatient Brief Assessment   Patient Details  Name: Melanie Madden MRN: [id number] Date of Birth: 01/05/1966  Transition of [facility]) CM/SW Contact:    Darrold Span, RN Phone Number: 03/29/2023, 4:02 PM   Clinical Narrative: Hx of CVA 12/20, We will continue to monitor patient advancement through interdisciplinary progression rounds. If new patient transition needs arise, please place a TOC consult.   Transition of Care Asessment: Insurance and Status: Insurance coverage has been reviewed Patient has primary care physician: Yes Home environment has been reviewed: home w/ spouse Prior level of function:: assisted Prior/Current Home Services: No current home services Social Determinants of Health Reivew: SDOH reviewed no interventions necessary Readmission risk has been reviewed: Yes Transition of care needs: no transition of care needs at this time

## 2023-03-29 NOTE — Progress Notes (Signed)
Family dynamics unclear. Patient is very angry and agitated when her husband is here. She is yelling with arms flying up in the air. He was trying to assist her with the television and she was very hostile and uncooperative. She does sometimes refuse care even when he is not present but is less animated.

## 2023-03-30 DIAGNOSIS — Q6211 Congenital occlusion of ureteropelvic junction: Secondary | ICD-10-CM | POA: Diagnosis not present

## 2023-03-30 DIAGNOSIS — R579 Shock, unspecified: Secondary | ICD-10-CM | POA: Diagnosis not present

## 2023-03-30 DIAGNOSIS — N179 Acute kidney failure, unspecified: Secondary | ICD-10-CM | POA: Diagnosis not present

## 2023-03-30 DIAGNOSIS — A419 Sepsis, unspecified organism: Secondary | ICD-10-CM | POA: Diagnosis not present

## 2023-03-30 LAB — BASIC METABOLIC PANEL
Anion gap: 9 (ref 5–15)
BUN: 18 mg/dL (ref 6–20)
CO2: 22 mmol/L (ref 22–32)
Calcium: 9.1 mg/dL (ref 8.9–10.3)
Chloride: 105 mmol/L (ref 98–111)
Creatinine, Ser: 1.36 mg/dL — ABNORMAL HIGH (ref 0.44–1.00)
GFR, Estimated: 45 mL/min — ABNORMAL LOW (ref >60)
Glucose, Bld: 114 mg/dL — ABNORMAL HIGH (ref 70–99)
Potassium: 4.1 mmol/L (ref 3.5–5.1)
Sodium: 136 mmol/L (ref 135–145)

## 2023-03-30 LAB — CBC
HCT: 32 % — ABNORMAL LOW (ref 36.0–46.0)
Hemoglobin: 10.4 g/dL — ABNORMAL LOW (ref 12.0–15.0)
MCH: 27.9 pg (ref 26.0–34.0)
MCHC: 32.5 g/dL (ref 30.0–36.0)
MCV: 85.8 fL (ref 80.0–100.0)
Platelets: 159 10*3/uL (ref 150–400)
RBC: 3.73 MIL/uL — ABNORMAL LOW (ref 3.87–5.11)
RDW: 14.7 % (ref 11.5–15.5)
WBC: 6.7 10*3/uL (ref 4.0–10.5)
nRBC: 0 % (ref 0.0–0.2)

## 2023-03-30 LAB — GLUCOSE, CAPILLARY
Glucose-Capillary: 95 mg/dL (ref 70–99)
Glucose-Capillary: 212 mg/dL — ABNORMAL HIGH (ref 70–99)
Glucose-Capillary: 70 mg/dL (ref 70–99)
Glucose-Capillary: 179 mg/dL — ABNORMAL HIGH (ref 70–99)

## 2023-03-30 MED ORDER — ALUM & MAG HYDROXIDE-SIMETH 200-200-20 MG/5ML PO SUSP: 15.0000 mL | ORAL | Status: DC | PRN

## 2023-03-30 MED ORDER — LACTATED RINGERS IV SOLN: INTRAVENOUS | Status: AC

## 2023-03-30 NOTE — Progress Notes (Signed)
OT Cancellation Note  Patient Details Name: Melanie Madden MRN: [id number] DOB: 05/19/1966   Cancelled Treatment:    Reason Eval/Treat Not Completed: Patient declined, no reason specified (Pt with agitation at attempted OT eval. Pt aphasic at baseline, but calling out "No" then pretending to not hear and attempting to hit/grab therapist with bed mobility attempt. Pt appears oriented as she purposefully gesterured to update daily calendar. OT to reattempt skilled OT eval at a later time as appropriate/available.)  Kindred Reidinger "Orson Eva., OTR/L, MA Acute Rehab [phone]   Lendon Colonel 03/30/2023, 11:08 AM

## 2023-03-30 NOTE — Progress Notes (Signed)
PT Cancellation Note  Patient Details Name: Melanie Madden MRN: [id number] DOB: 06/25/1966   Cancelled Treatment:    Reason Eval/Treat Not Completed: Patient declined, no reason specified; attempted evaluation in coordination with OT.  Patient initially stating "no" to all attempts, RN informed and reports this is her "aphasia" so on second attempt assisting to initiate mobility to EOB despite pt stating "no" and pt began to hit with her L UE and screamed when assisted to move R LE.  Repositioned in bed and reset bed alarm.  Will attempt again possibly try to coordinate with family.    Elray Mcgregor 03/30/2023, 11:55 AM Sheran Lawless, PT Acute Rehabilitation Services [phone] 03/30/2023

## 2023-03-30 NOTE — Care Plan (Signed)
Approximately 1600 Pt had foley removed, ambulated to bathroom with assistance from staff, while wearing foot brace and help from staff. Pt was able to void. Pt voiced wanting to use bedside commode at night, as oppose to using bathroom. Pt tolerated removal of foley well, ambulateed  with encouragment and voided in toilet. No further needs voiced at this time. Reva Bores 03/30/23 5:33 PM

## 2023-03-30 NOTE — Progress Notes (Signed)
[facility]                                                                              Melanie Madden, is a 57 y.o. 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 coli UTI with obstructing right renal stone, appendiceal adenocarcinoma stage IV, diagnosed on TAH/BSO in 11/2021 presented from home with right foot pain and swelling after she hit it on something.  Patient also had lower abdominal pain worsening over the past week and chills.  Initially normotensive, then developed hypotension in the ED, lowest BP 79/60, lactate 5.3, creatinine 2.2, hypokalemia, hyponatremia 133. She was placed on IV fluids and peripheral Levophed.  CT renal stone study showed 15 mm right UPJ stone with mild to moderate right hydronephrosis and multiple nonobstructing bilateral renal calculi as prior. Admitted to ICU with septic shcok.   Significant Hospital Events:   08/27 Admitted, on levophed 08/28 Blood culture positive for e coli. On ceftriaxone. 08/29 Off levophed 8/30: Transferred to the floor and TRH assumed care  Assessment & Plan    Principal problem Septic shock with UTI, POA  Bilateral nephrolithiasis, right UPJ stone with moderate hydronephrosis E. coli UTI, E. coli bacteremia -Off the vasopressors -Urine culture, blood cultures positive for E. Coli -Continue IV Rocephin -Right-sided PCN placed on 8/28, urology following.  Definitive stone management on outpatient basis once cleared of infection and hemodynamically stable. -Sepsis physiology improved, leukocytosis, lactic acidosis, AKI improving -Will need  14 days of antibiotics, will transition to oral antibiotics upon discharge. -DC Foley today, voiding trial   Acute kidney injury -Baseline creatinine 0.9, presented with creatinine of 2.1 with hyponatremia, hypokalemia, anion gap metabolic acidosis -Creatinine trended up to 1.3 today, will place on gentle hydration x 24hrs     History of CVA, hemorrhagic stroke in 2020 with residual aphasia and right-sided hemiparesis -Continue home gabapentin -PT OT evaluation   Essential hypertension -BP stable   Estimated body mass index is 26.35 kg/m as calculated from the following:   Height as of 01/21/23: 5\' 7"  (1.702 m).   Weight as of this encounter: 76.3 kg.  Code Status: Full code DVT Prophylaxis:  heparin injection 5,000 Units Start: 03/27/23 1400   Level of Care: Level of care: Progressive Family Communication:  Disposition Plan:      Remains inpatient appropriate: PT OT evaluation pending, hopefully DC home in 24 to 48 hours, voiding trial today   Procedures:  R PCN placed in IR 8/28   Consultants:   Interventional radiology CCM Urology  Antimicrobials:   Anti-infectives (From admission, onward)    Start     Dose/Rate Route Frequency Ordered Stop   03/26/23 2345  cefTRIAXone (ROCEPHIN) 2 g in sodium chloride  0.9 % 100 mL IVPB        2 g 200 mL/hr over 30 Minutes Intravenous Every 24 hours 03/26/23 2336 04/03/23 2359          Medications  Chlorhexidine Gluconate Cloth  6 each Topical Q0600   gabapentin  100 mg Oral QHS   heparin  5,000 Units Subcutaneous Q8H   insulin aspart  0-20 Units Subcutaneous TID WC   insulin aspart  0-5 Units Subcutaneous QHS   sodium chloride flush  5 mL Intracatheter Q8H      Subjective:   Melanie Madden was seen and examined today.  Has global aphasia, alert and awake, watching TV, flat affect.  Appears comfortable.    Objective:   Vitals:   03/29/23 2319 03/30/23 0300 03/30/23 0842 03/30/23 1107  BP: 122/74  121/77 138/77 114/75  Pulse: 96 83 86 97  Resp: 20 18 19  (!) 23  Temp: 98.9 F (37.2 C)  98.1 F (36.7 C) 98.4 F (36.9 C)  TempSrc: Oral  Oral Oral  SpO2: 93% 90% 94% 94%  Weight:        Intake/Output Summary (Last 24 hours) at 03/30/2023 1422 Last data filed at 03/30/2023 1230 Gross per 24 hour  Intake 341 ml  Output 3475 ml  Net -3134 ml     Wt Readings from Last 3 Encounters:  03/29/23 76.3 kg  01/21/23 63 kg  01/16/23 60 kg   Physical Exam General: Alert and awake, NAD, appears comfortable. Cardiovascular: S1 S2 clear, RRR.  Respiratory: CTAB anteriorly, Gastrointestinal: Refused  exam Ext: no pedal edema bilaterally Neuro: has history of right-sided hemiparesis, does not follow commands Psych: flat affect and aphasia    Data Reviewed:  I have personally reviewed following labs    CBC Lab Results  Component Value Date   WBC 6.7 03/30/2023   RBC 3.73 (L) 03/30/2023   HGB 10.4 (L) 03/30/2023   HCT 32.0 (L) 03/30/2023   MCV 85.8 03/30/2023   MCH 27.9 03/30/2023   PLT 159 03/30/2023   MCHC 32.5 03/30/2023   RDW 14.7 03/30/2023   LYMPHSABS 0.3 (L) 03/26/2023   MONOABS 0.1 03/26/2023   EOSABS 0.0 03/26/2023   BASOSABS 0.0 03/26/2023     Last metabolic panel Lab Results  Component Value Date   NA 136 03/30/2023   K 4.1 03/30/2023   CL 105 03/30/2023   CO2 22 03/30/2023   BUN 18 03/30/2023   CREATININE 1.36 (H) 03/30/2023   GLUCOSE 114 (H) 03/30/2023   GFRNONAA 45 (L) 03/30/2023   GFRAA >60 12/19/2019   CALCIUM 9.1 03/30/2023   PHOS 3.2 03/27/2023   PROT 6.6 01/21/2023   ALBUMIN 3.5 01/21/2023   BILITOT 0.5 01/21/2023   ALKPHOS 73 01/21/2023   AST 16 01/21/2023   ALT 20 01/21/2023   ANIONGAP 9 03/30/2023    CBG (last 3)  Recent Labs    03/29/23 2124 03/30/23 0636 03/30/23 1105  GLUCAP 100* 95 212*      Coagulation Profile: Recent Labs  Lab 03/26/23 2336  INR 1.2     Radiology Studies: I have personally reviewed the imaging  studies  No results found.     Thad Ranger M.D. [facility] 03/30/2023, 2:22 PM  Available via Epic secure chat 7am-7pm After 7 pm, please refer to night coverage provider listed on amion.

## 2023-03-31 DIAGNOSIS — R579 Shock, unspecified: Secondary | ICD-10-CM | POA: Diagnosis not present

## 2023-03-31 DIAGNOSIS — Q6211 Congenital occlusion of ureteropelvic junction: Secondary | ICD-10-CM | POA: Diagnosis not present

## 2023-03-31 DIAGNOSIS — A419 Sepsis, unspecified organism: Secondary | ICD-10-CM | POA: Diagnosis not present

## 2023-03-31 DIAGNOSIS — N179 Acute kidney failure, unspecified: Secondary | ICD-10-CM | POA: Diagnosis not present

## 2023-03-31 LAB — GLUCOSE, CAPILLARY
Glucose-Capillary: 113 mg/dL — ABNORMAL HIGH (ref 70–99)
Glucose-Capillary: 132 mg/dL — ABNORMAL HIGH (ref 70–99)
Glucose-Capillary: 146 mg/dL — ABNORMAL HIGH (ref 70–99)
Glucose-Capillary: 171 mg/dL — ABNORMAL HIGH (ref 70–99)

## 2023-03-31 LAB — CBC
HCT: 34.2 % — ABNORMAL LOW (ref 36.0–46.0)
Hemoglobin: 11.1 g/dL — ABNORMAL LOW (ref 12.0–15.0)
MCH: 28.2 pg (ref 26.0–34.0)
MCHC: 32.5 g/dL (ref 30.0–36.0)
MCV: 86.8 fL (ref 80.0–100.0)
Platelets: 172 10*3/uL (ref 150–400)
RBC: 3.94 MIL/uL (ref 3.87–5.11)
RDW: 14.6 % (ref 11.5–15.5)
WBC: 6.1 10*3/uL (ref 4.0–10.5)
nRBC: 0 % (ref 0.0–0.2)

## 2023-03-31 LAB — CULTURE, BLOOD (ROUTINE X 2): Special Requests: ADEQUATE

## 2023-03-31 LAB — BASIC METABOLIC PANEL
Anion gap: 9 (ref 5–15)
BUN: 20 mg/dL (ref 6–20)
CO2: 22 mmol/L (ref 22–32)
Calcium: 9.5 mg/dL (ref 8.9–10.3)
Chloride: 103 mmol/L (ref 98–111)
Creatinine, Ser: 0.99 mg/dL (ref 0.44–1.00)
GFR, Estimated: >60 mL/min (ref >60)
Glucose, Bld: 122 mg/dL — ABNORMAL HIGH (ref 70–99)
Potassium: 4.1 mmol/L (ref 3.5–5.1)
Sodium: 134 mmol/L — ABNORMAL LOW (ref 135–145)

## 2023-03-31 MED ORDER — MORPHINE SULFATE (PF) 2 MG/ML IV SOLN: 1.0000 mg | INTRAVENOUS | Status: DC | PRN

## 2023-03-31 MED ORDER — GABAPENTIN 100 MG PO CAPS: 100.0000 mg | ORAL_CAPSULE | Freq: Two times a day (BID) | ORAL | Status: DC

## 2023-03-31 MED ORDER — SODIUM CHLORIDE 0.9 % IV SOLN
2.0000 g | INTRAVENOUS | Status: DC
  Administered 2023-03-31: 2 g via INTRAVENOUS
  Filled 2023-03-31: qty 20

## 2023-03-31 MED ORDER — GABAPENTIN 100 MG PO CAPS
100.0000 mg | ORAL_CAPSULE | Freq: Every day | ORAL | Status: DC
  Administered 2023-03-31: 100 mg via ORAL
  Filled 2023-03-31: qty 1

## 2023-03-31 MED ORDER — DULOXETINE HCL 20 MG PO CPEP
20.0000 mg | ORAL_CAPSULE | Freq: Every day | ORAL | Status: DC
  Administered 2023-03-31 – 2023-04-01 (×2): 20 mg via ORAL
  Filled 2023-03-31 (×2): qty 1

## 2023-03-31 MED ORDER — TRAMADOL HCL 50 MG PO TABS: 50.0000 mg | ORAL_TABLET | Freq: Two times a day (BID) | ORAL | Status: DC | PRN

## 2023-03-31 NOTE — Progress Notes (Signed)
   Subjective/Chief Complaint:  1 - Large Rt Proximal Ureteral Stone - 15mm Rt prox stone with few small intra-renal stone son ER CT 8/28. Rt neph tueb placed 8/28 by IR.  2 - Urosepsis - frank sepsis with tachycardia, hypotension, elevated lactate from obstructing sotne / infected urine. UCX 8/28 pan-sensitive e. Coli. Placed on rocephin.  3 - Acute Renal Failure - normal baseline GFR. Cr peak 2's form sepsis and unilateral obstruction. Back to <1 9/1.   Today "Melanie Madden" is improving. Cr back to normal, only few low grade fevers.    Objective: Vital signs in last 24 hours: Temp:  [98.1 F (36.7 C)-99 F (37.2 C)] 98.5 F (36.9 C) (09/01 0000) Pulse Rate:  [86-99] 99 (08/31 2050) Resp:  [18-23] 18 (09/01 0000) BP: (114-141)/(74-92) 119/74 (08/31 2050) SpO2:  [94 %-99 %] 94 % (08/31 2050) Weight:  [76.4 kg] 76.4 kg (09/01 0500) Last BM Date : 03/30/23  Intake/Output from previous day: 08/31 0701 - 09/01 0700 In: 336 [P.O.:336] Out: 2342 [Urine:2341; Stool:1] Intake/Output this shift: No intake/output data recorded.  NAD, caretaker at bedside who helps with history and context AOx1, just states "no" over and over. Rt neph tube in place with non-foul urine, no CVAT SNTND  Lab Results:  Recent Labs    03/30/23 0653 03/31/23 0417  WBC 6.7 6.1  HGB 10.4* 11.1*  HCT 32.0* 34.2*  PLT 159 172   BMET Recent Labs    03/30/23 0653 03/31/23 0417  NA 136 134*  K 4.1 4.1  CL 105 103  CO2 22 22  GLUCOSE 114* 122*  BUN 18 20  CREATININE 1.36* 0.99  CALCIUM 9.1 9.5   PT/INR No results for input(s): "LABPROT", "INR" in the last 72 hours. ABG No results for input(s): "PHART", "HCO3" in the last 72 hours.  Invalid input(s): "PCO2", "PO2"  Studies/Results: No results found.  Anti-infectives: Anti-infectives (From admission, onward)    Start     Dose/Rate Route Frequency Ordered Stop   03/26/23 2345  cefTRIAXone (ROCEPHIN) 2 g in sodium chloride 0.9 % 100 mL IVPB         2 g 200 mL/hr over 30 Minutes Intravenous Every 24 hours 03/26/23 2336 04/03/23 2359       Assessment/Plan:  Agree with current ABX, likely can be bridged to PO at anytime now SIRS mostly resolved for 10-14 day total course.   We will arrange FU  with Dr. Liliane Shi in outpatient settin to plan for definitive stone management.  Neph tube to stay.    Loletta Parish. 03/31/2023

## 2023-03-31 NOTE — Progress Notes (Signed)
[facility]                                                                              Melanie Madden, is a 57 y.o. 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 coli UTI with obstructing right renal stone, appendiceal adenocarcinoma stage IV, diagnosed on TAH/BSO in 11/2021 presented from home with right foot pain and swelling after she hit it on something.  Patient also had lower abdominal pain worsening over the past week and chills.  Initially normotensive, then developed hypotension in the ED, lowest BP 79/60, lactate 5.3, creatinine 2.2, hypokalemia, hyponatremia 133. She was placed on IV fluids and peripheral Levophed.  CT renal stone study showed 15 mm right UPJ stone with mild to moderate right hydronephrosis and multiple nonobstructing bilateral renal calculi as prior. Admitted to ICU with septic shcok.   Significant Hospital Events:   08/27 Admitted, on levophed 08/28 Blood culture positive for e coli. On ceftriaxone. 08/29 Off levophed 8/30: Transferred to the floor and TRH assumed care  Assessment & Plan    Principal problem Septic shock with UTI, POA  Bilateral nephrolithiasis, right UPJ stone with moderate hydronephrosis E. coli UTI, E. coli bacteremia -Off the vasopressors -Urine culture, blood cultures positive for E. Coli -Continue IV Rocephin -Right-sided PCN placed on 8/28, urology following.  Definitive stone management on outpatient basis once cleared of infection and hemodynamically stable. -Sepsis physiology improved, leukocytosis, lactic acidosis, AKI improving -Will need  14 days of antibiotics, will transition to oral antibiotics upon discharge. -Foley catheter DC'd  8/31, voiding successfully   Acute kidney injury -Baseline creatinine 0.9, presented with creatinine of 2.1 with hyponatremia, hypokalemia, anion gap metabolic acidosis -Creatinine improved, at baseline 0.9    History of CVA, hemorrhagic stroke in 2020 with residual aphasia and right-sided hemiparesis Pain right hematologic started -Patient ambulated to the bathroom yesterday with assistance however today requiring 2+ max assist -Complaining of pain in the right hemiplegic side, from toe to head -Discussed with patient's husband x 2 today, patient declined to discharge this morning.  He mentioned that she has not tolerated increasing dose of gabapentin (at 200 mg, she became " loopy").  He requested outpatient PT OT at [facility] rehab -Placed on Cymbalta 20 mg daily am, if patient is able to tolerate Cymbalta, will increase to 30 mg, will help with the neuropathic pain.  Continue Neurontin 100 mg at bedtime -add IV morphine as needed for severe or breakthrough pain, trial of tramadol 50 mg every 12 hours as needed for moderate pain   Essential hypertension -BP stable   Estimated body mass index is 26.38 kg/m as calculated from the following:   Height as of 01/21/23: 5\' 7"  (1.702 m).  Weight as of this encounter: 76.4 kg.  Code Status: Full code DVT Prophylaxis:  heparin injection 5,000 Units Start: 03/27/23 1400   Level of Care: Level of care: Progressive Family Communication:  Disposition Plan:      Remains inpatient appropriate: TBD   Procedures:  R PCN placed in IR 8/28   Consultants:   Interventional radiology CCM Urology  Antimicrobials:   Anti-infectives (From admission, onward)    Start     Dose/Rate Route Frequency Ordered Stop   03/26/23 2345  cefTRIAXone (ROCEPHIN) 2 g in sodium chloride 0.9 % 100 mL IVPB        2 g 200 mL/hr over 30 Minutes Intravenous Every 24  hours 03/26/23 2336 04/03/23 2359          Medications  DULoxetine  20 mg Oral Daily   gabapentin  100 mg Oral QHS   heparin  5,000 Units Subcutaneous Q8H   insulin aspart  0-20 Units Subcutaneous TID WC   insulin aspart  0-5 Units Subcutaneous QHS   sodium chloride flush  5 mL Intracatheter Q8H      Subjective:   [name] was seen and examined today.  Patient seen twice today, uncomfortable with discharge home.  States pain in the right hemiplegic side.  She has global aphasia and difficult to comprehend at times which causes frustration to the patient.   Objective:   Vitals:   03/31/23 0000 03/31/23 0500 03/31/23 0843 03/31/23 1118  BP:   (!) 132/93 100/66  Pulse:   91 94  Resp: 18  19 18   Temp: 98.5 F (36.9 C)  98.7 F (37.1 C) 98.4 F (36.9 C)  TempSrc: Oral  Oral Oral  SpO2:   97% 96%  Weight:  76.4 kg      Intake/Output Summary (Last 24 hours) at 03/31/2023 1418 Last data filed at 03/31/2023 1140 Gross per 24 hour  Intake 596 ml  Output 1917 ml  Net -1321 ml     Wt Readings from Last 3 Encounters:  03/31/23 76.4 kg  01/21/23 63 kg  01/16/23 60 kg    Physical Exam General: Alert and awake, NAD Cardiovascular: S1 S2 clear, RRR.  Respiratory: CTAB, no wheezing Gastrointestinal: Soft, nontender, nondistended, NBS Ext: no pedal edema bilaterally Neuro: right sided hemiplegia Psych: somewhat upset and frustrated     Data Reviewed:  I have personally reviewed following labs    CBC Lab Results  Component Value Date   WBC 6.1 03/31/2023   RBC 3.94 03/31/2023   HGB 11.1 (L) 03/31/2023   HCT 34.2 (L) 03/31/2023   MCV 86.8 03/31/2023   MCH 28.2 03/31/2023   PLT 172 03/31/2023   MCHC 32.5 03/31/2023   RDW 14.6 03/31/2023   LYMPHSABS 0.3 (L) 03/26/2023   MONOABS 0.1 03/26/2023   EOSABS 0.0 03/26/2023   BASOSABS 0.0 03/26/2023     Last metabolic panel Lab Results  Component Value Date   NA 134 (L) 03/31/2023   K 4.1  03/31/2023   CL 103 03/31/2023   CO2 22 03/31/2023   BUN 20 03/31/2023   CREATININE 0.99 03/31/2023   GLUCOSE 122 (H) 03/31/2023   GFRNONAA >60 03/31/2023   GFRAA >60 12/19/2019   CALCIUM 9.5 03/31/2023   PHOS 3.2 03/27/2023   PROT 6.6 01/21/2023   ALBUMIN 3.5 01/21/2023   BILITOT 0.5 01/21/2023   ALKPHOS 73 01/21/2023   AST 16 01/21/2023   ALT 20 01/21/2023   ANIONGAP 9 03/31/2023  CBG (last 3)  Recent Labs    03/30/23 2158 03/31/23 0605 03/31/23 1116  GLUCAP 179* 113* 132*      Coagulation Profile: Recent Labs  Lab 03/26/23 2336  INR 1.2     Radiology Studies: I have personally reviewed the imaging studies  No results found.     Thad Ranger M.D. [facility] 03/31/2023, 2:18 PM  Available via Epic secure chat 7am-7pm After 7 pm, please refer to night coverage provider listed on amion.

## 2023-03-31 NOTE — Evaluation (Addendum)
Physical Therapy Evaluation Patient Details Name: Melanie Madden MRN: [id number] DOB: [birth year] Today's Date: 03/31/2023  History of Present Illness  Pt is 57 year old presented to [facility] on  03/26/23 for septic shock with UTI and bil nephrolisthiasis. PMH - stage IV appendiceal CA, anxiety, migraines, HTN, OA, expressive aphasia, R hemiplegia, kidney stones, L ACL repair, R bunionectomy, L meniscus repair, shoulder surgery  Clinical Impression  Pt presents to PT today requiring +2 max assist for bed to bsc to bed transfer. Appears to be having pain in entire rt hemiplegic side. Unsure of her baseline prior to admission as far as pain and mobility. Pt severely aphasic and family not present. Pt would not attempt to use her quad cane that was in the room and per nursing note they amb to the bathroom with her yesterday afternoon. I am not sure what has changed between her walking to the bathroom yesterday and what I am seeing today but pain seems to be at least part of the issue. If family is able to continue to manage pt at home then would recommend OPPT where she has been several times before. If they are not would need to consider other post acute options.         If plan is discharge home, recommend the following: A lot of help with walking and/or transfers;A lot of help with bathing/dressing/bathroom;Direct supervision/assist for medications management;Direct supervision/assist for financial management;Assist for transportation;Help with stairs or ramp for entrance   Can travel by private vehicle        Equipment Recommendations None recommended by PT  Recommendations for Other Services       Functional Status Assessment Patient has had a recent decline in their functional status and demonstrates the ability to make significant improvements in function in a reasonable and predictable amount of time.     Precautions / Restrictions Precautions Precautions: Fall;Other  (comment) Precaution Comments: severa aphasia Restrictions Weight Bearing Restrictions: No RLE Weight Bearing: Weight bearing as tolerated      Mobility  Bed Mobility Overal bed mobility: Needs Assistance Bed Mobility: Supine to Sit, Sit to Supine     Supine to sit: Max assist, +2 for safety/equipment Sit to supine: +2 for physical assistance, Max assist   General bed mobility comments: Assist for all aspects. Pt yelling out with transitional movements    Transfers Overall transfer level: Needs assistance Equipment used: 2 person hand held assist Transfers: Sit to/from Stand, Bed to chair/wheelchair/BSC Sit to Stand: +2 physical assistance, Max assist Stand pivot transfers: +2 physical assistance, Max assist         General transfer comment: Heavy assist to elevate hips and to pivot them from bed to bsc to bed. Pt refused to try using her quad cane or Stedy.    Ambulation/Gait                  Stairs            Wheelchair Mobility     Tilt Bed    Modified Rankin (Stroke Patients Only)       Balance Overall balance assessment: Needs assistance Sitting-balance support: No upper extremity supported, Feet supported Sitting balance-Leahy Scale: Fair     Standing balance support: Bilateral upper extremity supported Standing balance-Leahy Scale: Poor Standing balance comment: Heavy mod assist to maintain static standing  Pertinent Vitals/Pain Pain Assessment Pain Assessment: Faces Faces Pain Scale: Hurts whole lot Pain Location: entire rt side Pain Descriptors / Indicators: Moaning, Grimacing, Guarding Pain Intervention(s): Limited activity within patient's tolerance, Monitored during session, Repositioned    Home Living Family/patient expects to be discharged to:: Private residence Living Arrangements: Spouse/significant other Available Help at Discharge: Family;Available 24 hours/day;Personal care  attendant Type of Home: House Home Access: Stairs to enter   [employer] of Steps: 2-3   Home Layout: Able to live on main level with bedroom/bathroom Home Equipment: Cane - quad;Grab bars - toilet;Grab bars - tub/shower;Wheelchair - manual;Tub bench;BSC/3in1 Additional Comments: Information from prior encounter    Prior Function Prior Level of Function : Patient poor historian/Family not available             Mobility Comments: Due to aphasia difficult to discern. Two years ago per therapy notes pt was ambulatory with quad cane with supervision       Extremity/Trunk Assessment   Upper Extremity Assessment Upper Extremity Assessment: Defer to OT evaluation    Lower Extremity Assessment Lower Extremity Assessment: RLE deficits/detail RLE Deficits / Details: hemiplegic with AFO in place       Communication   Communication Communication: Difficulty following commands/understanding;Difficulty communicating thoughts/reduced clarity of speech Following commands: Follows one step commands inconsistently;Follows one step commands with increased time Cueing Techniques: Verbal cues;Gestural cues;Tactile cues;Visual cues  Cognition Arousal: Alert Behavior During Therapy: Restless Overall Cognitive Status: Difficult to assess                                          General Comments General comments (skin integrity, edema, etc.): VSS on RA    Exercises     Assessment/Plan    PT Assessment Patient needs continued PT services  PT Problem List Decreased strength;Decreased activity tolerance;Decreased balance;Decreased mobility;Pain       PT Treatment Interventions DME instruction;Gait training;Functional mobility training;Therapeutic activities;Therapeutic exercise;Balance training;Patient/family education    PT Goals (Current goals can be found in the Care Plan section)  Acute Rehab PT Goals Patient Stated Goal: Pt unable to state PT Goal  Formulation: Patient unable to participate in goal setting Time For Goal Achievement: 04/14/23 Potential to Achieve Goals: Fair    Frequency Min 1X/week     Co-evaluation               AM-PAC PT "6 Clicks" Mobility  Outcome Measure Help needed turning from your back to your side while in a flat bed without using bedrails?: A Lot Help needed moving from lying on your back to sitting on the side of a flat bed without using bedrails?: A Lot Help needed moving to and from a bed to a chair (including a wheelchair)?: Total Help needed standing up from a chair using your arms (e.g., wheelchair or bedside chair)?: Total Help needed to walk in hospital room?: Total Help needed climbing 3-5 steps with a railing? : Total 6 Click Score: 8    End of Session   Activity Tolerance: Patient limited by pain Patient left: in bed;with call bell/phone within reach;with bed alarm set Nurse Communication: Mobility status (Nurse tech assisting throughout) PT Visit Diagnosis: Unsteadiness on feet (R26.81);Other abnormalities of gait and mobility (R26.89);Pain;Hemiplegia and hemiparesis Hemiplegia - Right/Left: Right Hemiplegia - dominant/non-dominant: Dominant Hemiplegia - caused by: Nontraumatic intracerebral hemorrhage Pain - Right/Left: Right Pain - part of body: Shoulder;Arm;Hand;Hip;Knee;Leg;Ankle  and joints of foot    Time: 1610-9604 PT Time Calculation (min) (ACUTE ONLY): 16 min   Charges:   PT Evaluation $PT Eval Moderate Complexity: 1 Mod   PT General Charges $$ ACUTE PT VISIT: 1 Visit         [facility] PT Acute Rehabilitation Services Office [phone]   Angelina Ok [facility] 03/31/2023, 10:41 AM

## 2023-03-31 NOTE — Progress Notes (Addendum)
Small amount of bright red blood present in urine. This is a new symptom today. Patient does not complain of pain. MD notified. Paged urology, awaiting return call.  Manning MD stated blood present is a normal symptom. No new orders received.   Kenard Gower, RN

## 2023-04-01 DIAGNOSIS — R579 Shock, unspecified: Secondary | ICD-10-CM | POA: Diagnosis not present

## 2023-04-01 DIAGNOSIS — N179 Acute kidney failure, unspecified: Secondary | ICD-10-CM | POA: Diagnosis not present

## 2023-04-01 DIAGNOSIS — Q6211 Congenital occlusion of ureteropelvic junction: Secondary | ICD-10-CM | POA: Diagnosis not present

## 2023-04-01 DIAGNOSIS — A419 Sepsis, unspecified organism: Secondary | ICD-10-CM | POA: Diagnosis not present

## 2023-04-01 LAB — CBC
HCT: 33.7 % — ABNORMAL LOW (ref 36.0–46.0)
Hemoglobin: 11.1 g/dL — ABNORMAL LOW (ref 12.0–15.0)
MCH: 28.4 pg (ref 26.0–34.0)
MCHC: 32.9 g/dL (ref 30.0–36.0)
MCV: 86.2 fL (ref 80.0–100.0)
Platelets: 202 10*3/uL (ref 150–400)
RBC: 3.91 MIL/uL (ref 3.87–5.11)
RDW: 14.6 % (ref 11.5–15.5)
WBC: 6.4 10*3/uL (ref 4.0–10.5)
nRBC: 0 % (ref 0.0–0.2)

## 2023-04-01 LAB — BASIC METABOLIC PANEL
Anion gap: 11 (ref 5–15)
BUN: 18 mg/dL (ref 6–20)
CO2: 22 mmol/L (ref 22–32)
Calcium: 9.6 mg/dL (ref 8.9–10.3)
Chloride: 101 mmol/L (ref 98–111)
Creatinine, Ser: 0.86 mg/dL (ref 0.44–1.00)
GFR, Estimated: >60 mL/min (ref >60)
Glucose, Bld: 131 mg/dL — ABNORMAL HIGH (ref 70–99)
Potassium: 3.9 mmol/L (ref 3.5–5.1)
Sodium: 134 mmol/L — ABNORMAL LOW (ref 135–145)

## 2023-04-01 LAB — AEROBIC/ANAEROBIC CULTURE W GRAM STAIN (SURGICAL/DEEP WOUND)

## 2023-04-01 LAB — GLUCOSE, CAPILLARY
Glucose-Capillary: 117 mg/dL — ABNORMAL HIGH (ref 70–99)
Glucose-Capillary: 114 mg/dL — ABNORMAL HIGH (ref 70–99)

## 2023-04-01 MED ORDER — AMLODIPINE BESYLATE 10 MG PO TABS: 10.0000 mg | ORAL_TABLET | Freq: Every day | ORAL | Status: DC

## 2023-04-01 MED ORDER — DULOXETINE HCL 20 MG PO CPEP: 20.0000 mg | ORAL_CAPSULE | Freq: Every morning | ORAL | 2 refills | Status: DC

## 2023-04-01 MED ORDER — TRAMADOL HCL 50 MG PO TABS: 50.0000 mg | ORAL_TABLET | Freq: Two times a day (BID) | ORAL | 0 refills | Status: DC | PRN

## 2023-04-01 MED ORDER — CEPHALEXIN 500 MG PO CAPS: 500.0000 mg | ORAL_CAPSULE | Freq: Four times a day (QID) | ORAL | 0 refills | Status: AC

## 2023-04-01 NOTE — Evaluation (Signed)
Occupational Therapy Evaluation Patient Details Name: Melanie Madden MRN: [id number] DOB: [birth year] Today's Date: 04/01/2023   History of Present Illness Pt is 57 year old presented to [facility] on  03/26/23 for septic shock with UTI and bil nephrolisthiasis. PMH - stage IV appendiceal CA, anxiety, migraines, HTN, OA, expressive aphasia, R hemiplegia, kidney stones, L ACL repair, R bunionectomy, L meniscus repair, shoulder surgery   Clinical Impression   Per spouse, pt ind with ADLs however spouse does assist with bathing, uses quad cane for mobility. Pt has caregiver daily 8-4:30pm, spouse assists after 4:30. Pt currently needing set up-mod A for ADLs, mod A +2 for bed mobility and mod A +2 for transfers with 2 person HHA. 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 Pt presenting with impairments listed below, will follow acutely. Recommend OP OT at d/c given family/caregiver can provide level of assist needed.         If plan is discharge home, recommend the following: A little help with walking and/or transfers;A lot of help with bathing/dressing/bathroom;Assistance with cooking/housework;Direct supervision/assist for medications management;Direct supervision/assist for financial management;Assist for transportation;Help with stairs or ramp for entrance    Functional Status Assessment  Patient has had a recent decline in their functional status and demonstrates the ability to make significant improvements in function in a reasonable and predictable amount of time.  Equipment Recommendations  None recommended by OT (pt has all needed DME)    Recommendations for Other Services PT consult     Precautions / Restrictions Precautions Precautions: Fall;Other (comment) Precaution Comments: severe aphasia Restrictions Weight Bearing Restrictions: No RLE Weight Bearing: Weight bearing as tolerated      Mobility Bed  Mobility Overal bed mobility: Needs Assistance         Sit to supine: Mod assist, +2 for physical assistance   General bed mobility comments: assist to bring BLE's into bed    Transfers Overall transfer level: Needs assistance Equipment used: 2 person hand held assist Transfers: Sit to/from Stand, Bed to chair/wheelchair/BSC Sit to Stand: Mod assist, +2 physical assistance Stand pivot transfers: Mod assist, +2 physical assistance                Balance Overall balance assessment: Needs assistance Sitting-balance support: No upper extremity supported, Feet supported Sitting balance-Leahy Scale: Fair     Standing balance support: Bilateral upper extremity supported Standing balance-Leahy Scale: Poor                             ADL either performed or assessed with clinical judgement   ADL Overall ADL's : Needs assistance/impaired Eating/Feeding: Set up   Grooming: Set up;Sitting   Upper Body Bathing: Moderate assistance   Lower Body Bathing: Moderate assistance   Upper Body Dressing : Minimal assistance   Lower Body Dressing: Moderate assistance   Toilet Transfer: Moderate assistance;+2 for physical assistance;+2 for safety/equipment   Toileting- Clothing Manipulation and Hygiene: Set up       Functional mobility during ADLs: Moderate assistance;+2 for physical assistance       Vision   Additional Comments: will further assess     Perception         Praxis         Pertinent Vitals/Pain Pain Assessment Pain Assessment: No/denies pain Pain Location: pt unable to state Pain Descriptors / Indicators: Moaning, Grimacing, Guarding Pain Intervention(s): Limited activity within patient's  tolerance, Monitored during session, Repositioned     Extremity/Trunk Assessment Upper Extremity Assessment Upper Extremity Assessment: RUE deficits/detail RUE Deficits / Details: using LUE to lift RUE, deferred full assessment secondary to  agitation   Lower Extremity Assessment Lower Extremity Assessment: Defer to PT evaluation   Cervical / Trunk Assessment Cervical / Trunk Assessment: Normal   Communication Communication Communication: Difficulty following commands/understanding;Difficulty communicating thoughts/reduced clarity of speech Following commands: Follows one step commands inconsistently;Follows one step commands with increased time Cueing Techniques: Verbal cues   Cognition Arousal: Alert Behavior During Therapy: Restless Overall Cognitive Status: Difficult to assess                                       General Comments  VSS on RA    Exercises     Shoulder Instructions      Home Living Family/patient expects to be discharged to:: Private residence Living Arrangements: Spouse/significant other Available Help at Discharge: Family;Available 24 hours/day;Personal care attendant Type of Home: House Home Access: Stairs to enter [employer] of Steps: 2-3   Home Layout: Able to live on main level with bedroom/bathroom;Multi-level (has chair lift)     Bathroom Shower/Tub: Tub/shower unit         Home Equipment: Cane - quad;Grab bars - toilet;Grab bars - tub/shower;Wheelchair - manual;Tub bench;BSC/3in1;Hand held shower head (chair lift)   Additional Comments: caregiver present 8-4:30pm      Prior Functioning/Environment Prior Level of Function : Independent/Modified Independent             Mobility Comments: walks with quad cane ADLs Comments: does speech therapy at [facility], ind with ADLs, spouse assists with bathing        OT Problem List: Decreased strength;Decreased range of motion;Decreased activity tolerance;Impaired balance (sitting and/or standing);Decreased cognition;Decreased safety awareness;Impaired UE functional use      OT Treatment/Interventions: Therapeutic exercise;Energy conservation;DME and/or AE instruction;Therapeutic  activities;Patient/family education;Balance training    OT Goals(Current goals can be found in the care plan section) Acute Rehab OT Goals Patient Stated Goal: none stated OT Goal Formulation: With patient Time For Goal Achievement: 04/15/23 Potential to Achieve Goals: Good ADL Goals Pt Will Perform Upper Body Dressing: with contact guard assist;sitting Pt Will Perform Lower Body Dressing: with contact guard assist;sit to/from stand;sitting/lateral leans Pt Will Transfer to Toilet: with contact guard assist;ambulating;regular height toilet Pt Will Perform Tub/Shower Transfer: Tub transfer;Shower transfer;with contact guard assist;ambulating  OT Frequency: Min 1X/week    Co-evaluation PT/OT/SLP Co-Evaluation/Treatment: Yes Reason for Co-Treatment: For patient/therapist safety;To address functional/ADL transfers   OT goals addressed during session: ADL's and self-care      AM-PAC OT "6 Clicks" Daily Activity     Outcome Measure Help from another person eating meals?: A Little Help from another person taking care of personal grooming?: A Little Help from another person toileting, which includes using toliet, bedpan, or urinal?: A Lot Help from another person bathing (including washing, rinsing, drying)?: A Lot Help from another person to put on and taking off regular upper body clothing?: A Lot Help from another person to put on and taking off regular lower body clothing?: A Lot 6 Click Score: 14   End of Session Nurse Communication: Mobility status  Activity Tolerance: Patient tolerated treatment well Patient left: in bed;with call bell/phone within reach;with bed alarm set;with family/visitor present  OT Visit Diagnosis: Unsteadiness on feet (R26.81);Other abnormalities of gait and  mobility (R26.89);Muscle weakness (generalized) (M62.81)                Time: 1250-1320 OT Time Calculation (min): 30 min Charges:  OT General Charges $OT Visit: 1 Visit OT Evaluation $OT Eval  Moderate Complexity: 1 Mod  Brevyn Ring K, OTD, OTR/L SecureChat Preferred Acute Rehab [phone]   Tallula Grindle K Koonce 04/01/2023, 1:35 PM

## 2023-04-01 NOTE — Progress Notes (Signed)
Physical Therapy Treatment Patient Details Name: Melanie Madden MRN: [id number] DOB: 1965/11/21 Today's Date: 04/01/2023   History of Present Illness Pt is 57 year old presented to [facility] on  03/26/23 for septic shock with UTI and bil nephrolisthiasis. PMH - stage IV appendiceal CA, anxiety, migraines, HTN, OA, expressive aphasia, R hemiplegia, kidney stones, L ACL repair, R bunionectomy, L meniscus repair, shoulder surgery    PT Comments  Pt received sitting on BSC and agreeable to assist back to bed. Pt requires increased time to respond to cues and complete mobility tasks with pt mainly responding with yes/no. Pt requires up to mod A +2 to stand from [facility] and pivot to EOB due to weakness and impaired balance. Pt states "no" when asked if she can lift her LLE, however is able to perform without assist. Pt requires up to mod A +2 to return to supine and pt declines further mobility. Pt's husband reports 24/7 assist between him and a caregiver and states that he feels comfortable providing increased physical assist. Pt continues to benefit from PT services to progress toward functional mobility goals.    If plan is discharge home, recommend the following: A lot of help with walking and/or transfers;A lot of help with bathing/dressing/bathroom;Direct supervision/assist for medications management;Direct supervision/assist for financial management;Assist for transportation;Help with stairs or ramp for entrance   Can travel by private vehicle        Equipment Recommendations  None recommended by PT    Recommendations for Other Services       Precautions / Restrictions Precautions Precautions: Fall;Other (comment) Precaution Comments: severe aphasia Restrictions Weight Bearing Restrictions: No RLE Weight Bearing: Weight bearing as tolerated     Mobility  Bed Mobility Overal bed mobility: Needs Assistance Bed Mobility: Sit to Supine       Sit to supine: Mod assist, +2 for  physical assistance   General bed mobility comments: assist to bring BLE's into bed    Transfers Overall transfer level: Needs assistance Equipment used: 2 person hand held assist Transfers: Sit to/from Stand, Bed to chair/wheelchair/BSC Sit to Stand: Mod assist, +2 physical assistance Stand pivot transfers: Mod assist, +2 physical assistance         General transfer comment: STS from [facility] and stand pivot to EOB with mod A +2 for power up and balance        Balance Overall balance assessment: Needs assistance Sitting-balance support: No upper extremity supported, Feet supported Sitting balance-Leahy Scale: Fair Sitting balance - Comments: sitting EOB   Standing balance support: Bilateral upper extremity supported Standing balance-Leahy Scale: Poor Standing balance comment: 2 HHA with pt leaning onto therapist for support                            Cognition Arousal: Alert Behavior During Therapy: Restless Overall Cognitive Status: Difficult to assess                                          Exercises      General Comments General comments (skin integrity, edema, etc.): VSS on RA      Pertinent Vitals/Pain Pain Assessment Pain Assessment: No/denies pain     PT Goals (current goals can now be found in the care plan section) Acute Rehab PT Goals Patient Stated Goal: Pt unable to state PT Goal Formulation:  Patient unable to participate in goal setting Time For Goal Achievement: 04/14/23 Potential to Achieve Goals: Fair Progress towards PT goals: Progressing toward goals    Frequency    Min 1X/week      PT Plan      Co-evaluation PT/OT/SLP Co-Evaluation/Treatment: Yes Reason for Co-Treatment: For patient/therapist safety;To address functional/ADL transfers PT goals addressed during session: Mobility/safety with mobility;Balance OT goals addressed during session: ADL's and self-care      AM-PAC PT "6 Clicks" Mobility    Outcome Measure  Help needed turning from your back to your side while in a flat bed without using bedrails?: A Lot Help needed moving from lying on your back to sitting on the side of a flat bed without using bedrails?: A Lot Help needed moving to and from a bed to a chair (including a wheelchair)?: Total Help needed standing up from a chair using your arms (e.g., wheelchair or bedside chair)?: Total Help needed to walk in hospital room?: Total Help needed climbing 3-5 steps with a railing? : Total 6 Click Score: 8    End of Session   Activity Tolerance: Other (comment) (Pt declining further mobility) Patient left: in bed;with call bell/phone within reach;with family/visitor present Nurse Communication: Mobility status PT Visit Diagnosis: Unsteadiness on feet (R26.81);Other abnormalities of gait and mobility (R26.89);Pain;Hemiplegia and hemiparesis Hemiplegia - Right/Left: Right Hemiplegia - dominant/non-dominant: Dominant Hemiplegia - caused by: Nontraumatic intracerebral hemorrhage     Time: 1255-1320 PT Time Calculation (min) (ACUTE ONLY): 25 min  Charges:    $Therapeutic Activity: 8-22 mins PT General Charges $$ ACUTE PT VISIT: 1 Visit                     Johny Shock, PTA Acute Rehabilitation Services Secure Chat Preferred  [phone]    Johny Shock 04/01/2023, 2:06 PM

## 2023-04-01 NOTE — Progress Notes (Signed)
   Subjective/Chief Complaint:  1 - Large Rt Proximal Ureteral Stone - 15mm Rt prox stone with few small intra-renal stone son ER CT 8/28. Rt neph tueb placed 8/28 by IR.  2 - Urosepsis - frank sepsis with tachycardia, hypotension, elevated lactate from obstructing sotne / infected urine. UCX 8/28 pan-sensitive e. Coli. Placed on rocephin.  3 - Acute Renal Failure - normal baseline GFR. Cr peak 2's form sepsis and unilateral obstruction. Back to <1 9/1.   Today "Lauriana" is continuing to improve. Some scant on / off hematuria as expectd with stone and neph tube. Afebrile now x 24 hours.    Objective: Vital signs in last 24 hours: Temp:  [98 F (36.7 C)-98.7 F (37.1 C)] 98 F (36.7 C) (09/02 0302) Pulse Rate:  [89-99] 99 (09/01 1956) Resp:  [16-21] 20 (09/02 0302) BP: (100-140)/(65-93) 117/65 (09/02 0302) SpO2:  [94 %-99 %] 99 % (09/02 0302) Last BM Date : 03/30/23  Intake/Output from previous day: 09/01 0701 - 09/02 0700 In: 2642.4 [P.O.:596; I.V.:1746.4; IV Piggyback:300] Out: 1895 [Urine:1895] Intake/Output this shift: No intake/output data recorded.  NAD, caretaker at bedside who helps with history and context AOx1, more cooperative today Rt neph tube in place with non-foul urine, no CVAT SNTND  Lab Results:  Recent Labs    03/31/23 0417 04/01/23 0411  WBC 6.1 6.4  HGB 11.1* 11.1*  HCT 34.2* 33.7*  PLT 172 202   BMET Recent Labs    03/31/23 0417 04/01/23 0411  NA 134* 134*  K 4.1 3.9  CL 103 101  CO2 22 22  GLUCOSE 122* 131*  BUN 20 18  CREATININE 0.99 0.86  CALCIUM 9.5 9.6   PT/INR No results for input(s): "LABPROT", "INR" in the last 72 hours. ABG No results for input(s): "PHART", "HCO3" in the last 72 hours.  Invalid input(s): "PCO2", "PO2"  Studies/Results: No results found.  Anti-infectives: Anti-infectives (From admission, onward)    Start     Dose/Rate Route Frequency Ordered Stop   03/31/23 1500  cefTRIAXone (ROCEPHIN) 2 g in  sodium chloride 0.9 % 100 mL IVPB        2 g 200 mL/hr over 30 Minutes Intravenous Every 24 hours 03/31/23 1433 04/04/23 1529   03/26/23 2345  cefTRIAXone (ROCEPHIN) 2 g in sodium chloride 0.9 % 100 mL IVPB  Status:  Discontinued        2 g 200 mL/hr over 30 Minutes Intravenous Every 24 hours 03/26/23 2336 03/31/23 1433       Assessment/Plan:  Agree with current ABX, likely can be bridged to PO at anytime now SIRS mostly resolved for 10-14 day total course.   We will arrange FU  with Dr. Liliane Shi in outpatient settin to plan for definitive stone management.  Neph tube to stay.   OK for DC form GU perspective at anytime.   Appreciate Dr. Fransico Him comanagment.   Loletta Parish. 04/01/2023

## 2023-04-01 NOTE — TOC Transition Note (Signed)
Transition of Care (TOC) - CM/SW Discharge Note Donn Pierini RN, BSN Transitions of Care Unit 4E- RN Case Manager See Treatment Team for direct phone #   Patient Details  Name: Idalene Zima MRN: [id number] Date of Birth: 1966-03-14  Transition of Care [facility]) CM/SW Contact:  Darrold Span, RN Phone Number: 04/01/2023, 11:50 AM   Clinical Narrative:    Pt stable for transition home today w/ spouse, per MD pt needs referral to outpt PTOT.   CM spoke with spouse at bedside to confirm [facility] location that they prefer- per spouse they have used [facility] off [address] in the past and would like referral to go there again.   Referral sent to [facility] outpt PTOT per verbal orders from MD- reference #- [id number]  Spouse to transport home, No further TOC needs noted   Final next level of care: OP Rehab Barriers to Discharge: Barriers Resolved   Patient Goals and CMS Choice CMS Medicare.gov Compare Post Acute Care list provided to:: Patient Represenative (must comment) (outpt PT/OT) Choice offered to / list presented to : Spouse  Discharge Placement                 home        Discharge Plan and Services Additional resources added to the After Visit Summary for   In-house Referral: NA Discharge Planning Services: CM Consult Post Acute Care Choice: NA          DME Arranged: N/A DME Agency: NA       HH Arranged: NA HH Agency: NA        Social Determinants of Health (SDOH) Interventions SDOH Screenings   Depression (PHQ2-9): Low Risk  (11/10/2020)  Tobacco Use: Medium Risk (03/26/2023)     Readmission Risk Interventions    04/01/2023   11:50 AM  Readmission Risk Prevention Plan  Post Dischage Appt Complete  Medication Screening Complete  Transportation Screening Complete

## 2023-04-01 NOTE — Progress Notes (Signed)
Patient given discharge instructions. Husband present. PIVs removed. CCMD notified. Patient taken to vehicle in wheelchair by staff.   Kenard Gower, RN

## 2023-04-01 NOTE — Plan of Care (Signed)
POC progressing.  

## 2023-04-01 NOTE — Discharge Summary (Signed)
Physician Discharge Summary   Patient: Melanie Madden MRN: [id number] DOB: 1966/01/17  Admit date:     03/26/2023  Discharge date: 04/01/23  Discharge Physician: Thad Ranger, MD    PCP: [facility] [place]   Recommendations at discharge:   Continue 500 mg 4 times daily for 10 more days (for total 14 days) Definitive stone treatment per urology outpatient Started on Cymbalta 20 mg daily every morning Continue Neurontin 100 mg at bedtime Tramadol 50 mg every 12 hours as needed for mod-severe pain  Discharge Diagnoses:    Septic shock (HCC) E. coli UTI E. 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 coli UTI with obstructing right renal stone, appendiceal adenocarcinoma stage IV, diagnosed on TAH/BSO in 11/2021 presented from home with right foot pain and swelling after she hit it on something.  Patient also had lower abdominal pain worsening over the past week and chills.  Initially normotensive, then developed hypotension in the ED, lowest BP 79/60, lactate 5.3, creatinine 2.2, hypokalemia, hyponatremia 133. She was placed on IV fluids and peripheral Levophed.  CT renal stone study showed 15 mm right UPJ stone with mild to moderate right hydronephrosis and multiple nonobstructing bilateral renal calculi as prior. Admitted to ICU with septic shcok. 08/27 Admitted, on levophed 08/28 Blood culture positive for e coli. On ceftriaxone. 08/29 Off levophed 8/30: Transferred to the  floor   Assessment and Plan:   Septic shock with UTI, POA  Bilateral nephrolithiasis, right UPJ stone with moderate hydronephrosis E. coli UTI, E. coli bacteremia -Off the vasopressors. Urine culture, blood cultures positive for E. Coli -Patient was placed on IV Rocephin.  Urology was consulted. -Right-sided PCN placed on 8/28, continue upon discharge -Sepsis physiology improved -Transitioned to oral Keflex 500 mg 4 times daily for 10 more days to complete course for 14 days. -Foley catheter DC'd  8/31, voiding successfully -Outpatient follow-up with urology for definitive stone management   Acute kidney injury -Baseline creatinine 0.9, presented with creatinine of 2.1 with hyponatremia, hypokalemia, anion gap metabolic acidosis -Creatinine improved, at baseline 0.9       History of CVA, hemorrhagic stroke in 2020 with residual aphasia and right-sided hemiparesis Pain right side  -Patient has successfully now ambulating with assistance.  On 9/1 she had complained of pain in the entire right hemiplegic side, stated that it had been going on for the last 3 days, worse on moving. -Per my discussion with the patient's husband, she has not tolerated higher doses of gabapentin, hence continue gabapentin 100 mg at bedtime.  -Started on Cymbalta, 20 mg daily a.m., patient tolerated well and not complaining of pain today. - trial of tramadol 50 mg every 12 hours as needed for moderate pain -TOC to arrange outpatient PT OT at the [facility] rehab center, [place].  Per husband, patient does well with outpatient physical therapy. -Patient is looking forward to go home today     Essential hypertension -BP stable     Estimated body mass index is 26.38 kg/m as calculated from the  following:   Height as of 01/21/23: 5\' 7"  (1.702 m).   Weight as of this encounter: 76.4 kg.     Pain control - [employer] Controlled Substance Reporting System database was reviewed. and patient was instructed,  not to drive, operate heavy machinery, perform activities at heights, swimming or participation in water activities or provide baby-sitting services while on Pain, Sleep and Anxiety Medications; until their outpatient Physician has advised to do so again. Also recommended to not to take more than prescribed Pain, Sleep and Anxiety Medications.  Consultants: PCCM, neurology Procedures performed: R PCN placed in IR 8/28   Disposition: Home Diet recommendation:  Discharge Diet Orders (From admission, onward)     Start     Ordered   04/01/23 0000  Diet - low sodium heart healthy        04/01/23 1120            DISCHARGE MEDICATION: Allergies as of 04/01/2023       Reactions   Amoxicillin Nausea Only   Shellfish Allergy Diarrhea, Nausea And Vomiting   Mussels         Medication List     TAKE these medications    amLODipine 10 MG tablet Commonly known as: NORVASC Take 1 tablet (10 mg total) by mouth daily. Hold until follow-up with your doctor What changed: additional instructions   atorvastatin 20 MG tablet Commonly known as: LIPITOR Take 1 tablet (20 mg total) by mouth daily at 6 PM. What changed: when to take this   B-complex with vitamin C tablet Take 1 tablet by mouth daily.   CALCIUM PO Take 1 tablet by mouth daily.   cephALEXin 500 MG capsule Commonly known as: KEFLEX Take 1 capsule (500 mg total) by mouth 4 (four) times daily for 10 days.   [employer] C-LAX LAXATIVE PO Take by mouth as needed (constiipation).   DULoxetine 20 MG capsule Commonly known as: CYMBALTA Take 1 capsule (20 mg total) by mouth in the morning.   ferrous sulfate 325 (65 FE) MG EC tablet Take 325 mg by mouth daily with breakfast.   FISH OIL PO Take by mouth.   gabapentin 100 MG capsule Commonly known as: NEURONTIN 100 mg at bedtime.   magnesium 30 MG tablet Take 30 mg by mouth daily.   traMADol 50 MG tablet Commonly known as: ULTRAM Take 1 tablet (50 mg total) by mouth every 12  (twelve) hours as needed for moderate pain or severe pain.   Vitamin D 50 MCG (2000 UT) Caps Take 2,000 Units by mouth daily.   VITAMIN E PO Take 1 tablet by mouth daily.   zinc gluconate 50 MG tablet Take 50 mg by mouth daily.        Follow-up Information     [facility] [place]. Schedule an appointment as soon as possible for a visit in 2 week(s).   Specialty: Family Medicine Why: for hospital follow-up Contact information: [address] [place] [address] [phone]         Loletta Parish., MD. Schedule an appointment as soon as possible for a visit in 10 day(s).   Specialty: Urology Why: for hospital follow-up.  Please call tomorrow am to make the appointment. Needs definitive stone treatment in 10 to 14 days. Contact information: [address] [place] [address] [phone]         [facility] Follow up.   Specialty: Rehabilitation Why: outpt PT/OT referral  made- they will contact you to schedule - or you may reach out to them if you have not heard anything within 7 days Contact information: [address] [place] [address] [phone]               Discharge Exam: Ceasar Mons Weights   03/29/23 0500 03/31/23 0500 04/01/23 0717  Weight: 76.3 kg 76.4 kg 76.4 kg   S: Feeling a lot better today, eating breakfast, looking forward to go home.  No significant pain today.  BP 121/84 (BP Location: Right Arm)   Pulse 92   Temp 98.5 F (36.9 C) (Oral)   Resp 20   Wt 76.4 kg   SpO2 99%   BMI 26.38 kg/m   Physical Exam General: Alert and oriented x 3, NAD Cardiovascular: S1 S2 clear, RRR.  Respiratory: CTAB Gastrointestinal: Soft, nontender, nondistended, NBS Ext: no pedal edema bilaterally Neuro: right-sided hemiparesis GU: Right PCN + Psych: Normal affect   Condition at discharge: fair  The results of significant diagnostics from this hospitalization  (including imaging, microbiology, ancillary and laboratory) are listed below for reference.   Imaging Studies: IR NEPHROSTOMY PLACEMENT RIGHT  Result Date: 03/27/2023 INDICATION: 56 year old female with history of nephrolithiasis presenting with obstructive right ureteropelvic junction renal calculus complicated by urosepsis. EXAM: 1. ULTRASOUND GUIDANCE FOR PUNCTURE OF THE RIGHT RENAL COLLECTING SYSTEM 2. RIGHT PERCUTANEOUS NEPHROSTOMY TUBE PLACEMENT. COMPARISON:  None Available. MEDICATIONS: The patient was receiving intravenous Rocephin as an inpatient, no additional antibiotics were administered. ANESTHESIA/SEDATION: Moderate (conscious) sedation was employed during this procedure. A total of Versed 1 mg and Fentanyl 25 mcg was administered intravenously. Moderate Sedation Time: 13 minutes. The patient's level of consciousness and vital signs were monitored continuously by radiology nursing throughout the procedure under my direct supervision. CONTRAST:  Two mL Isovue 300 - administered into the renal collecting system FLUOROSCOPY TIME:  Six mGy COMPLICATIONS: None immediate. PROCEDURE: The procedure, risks, benefits, and alternatives were explained to the patient. Questions regarding the procedure were encouraged and answered. The patient understands and consents to the procedure. A timeout was performed prior to the initiation of the procedure. The right flank region was prepped and draped in the usual sterile fashion and a sterile drape was applied covering the operative field. A sterile gown and sterile gloves were used for the procedure. Local anesthesia was provided with 1% Lidocaine with epinephrine. Ultrasound was used to localize the right kidney. Under direct ultrasound guidance, a 21 gauge needle was advanced into the renal collecting system. An ultrasound image documentation was performed. Access within the collecting system was confirmed with the efflux of purulent urine. Over a Nitrex wire,  the tract was dilated with an Accustick stent. Next, under intermittent fluoroscpic guidance and over a short Amplatz wire, the track was dilated ultimately allowing placement of a 10.2-French percutaneous nephrostomy catheter which was advanced to the level of the renal pelvis where the coil was formed and locked. Minimal contrast was injected and fluoroscopic images were obtained, confirming pigtail location within the renal pelvis. Aspiration was performed which yielded approximately 100 mL of purulent, thick fluid. Sample sent to the lab for culture. The catheter was secured at the skin with a 0 silk retention suture and stat lock device and connected to a gravity bag was placed. Dressings were applied. The patient tolerated procedure well without immediate postprocedural complication. FINDINGS: Ultrasound scanning demonstrates a moderate to moderately dilated right collecting system with obstructive ureteropelvic calculus. Under a combination of ultrasound  and fluoroscopic guidance, a posterior inferior calix was targeted allowing placement of a 10.2-French percutaneous nephrostomy catheter with end coiled and locked within the renal pelvis. Contrast injection confirmed appropriate positioning. IMPRESSION: Successful ultrasound and fluoroscopic guided placement of a right sided 10.2 French percutaneous nephrostomy. Marliss Coots, MD [facility] Radiology Electronically Signed   By: Marliss Coots M.D.   On: 03/27/2023 08:26   CT Renal Stone Study  Result Date: 03/27/2023 CLINICAL DATA:  Abdominal pain.  Concern for kidney stone. EXAM: CT ABDOMEN AND PELVIS WITHOUT CONTRAST TECHNIQUE: Multidetector CT imaging of the abdomen and pelvis was performed following the standard protocol without IV contrast. RADIATION DOSE REDUCTION: This exam was performed according to the departmental dose-optimization program which includes automated exposure control, adjustment of  the mA and/or kV according to patient size and/or use of iterative reconstruction technique. COMPARISON:  CT abdomen pelvis dated 01/21/2023. FINDINGS: Evaluation of this exam is limited in the absence of intravenous contrast. Lower chest: The visualized lung bases are clear. No intra-abdominal free air.  Trace free fluid in the pelvis. Hepatobiliary: The liver is unremarkable. No biliary dilatation. The gallbladder is unremarkable. Pancreas: Unremarkable. No pancreatic ductal dilatation or surrounding inflammatory changes. Spleen: Normal in size without focal abnormality. Adrenals/Urinary Tract: The right adrenal glands unremarkable. There is a 4.4 cm left adrenal adenoma similar to prior CT. There is a 15 mm stone at the right ureteropelvic junction with mild-to-moderate right hydronephrosis. Multiple additional nonobstructing bilateral renal calculi noted. There is no hydronephrosis on the left. There is mild left renal parenchyma atrophy. The visualized ureters appear unremarkable. The Foley catheter is noted within the urinary bladder. The urinary bladder is minimally distended. Air within the bladder introduced via the catheter. Stomach/Bowel: Mild sigmoid diverticulosis without active inflammatory changes. There is no bowel obstruction or active inflammation. Appendectomy. Vascular/Lymphatic: Mild atherosclerotic calcification of the abdominal aorta. The IVC is unremarkable. No portal venous gas. There is no adenopathy. Reproductive: Hysterectomy.  No adnexal masses. Other: None Musculoskeletal: Degenerative changes.  No acute osseous pathology. IMPRESSION: 1. A 15 mm right UPJ stone with mild-to-moderate right hydronephrosis. 2. Multiple additional nonobstructing bilateral renal calculi. 3. Stable left adrenal adenoma. 4. Mild sigmoid diverticulosis. No bowel obstruction. 5.  Aortic Atherosclerosis (ICD10-I70.0). Electronically Signed   By: Elgie Collard M.D.   On: 03/27/2023 01:24   DG Chest Portable 1  View  Result Date: 03/27/2023 CLINICAL DATA:  Sepsis, weakness. EXAM: PORTABLE CHEST 1 VIEW COMPARISON:  12/09/2020. FINDINGS: The heart size and mediastinal contours are within normal limits. There is atherosclerotic calcification of the aorta. Lung volumes are low with mild atelectasis at the left lung base. No effusion or pneumothorax. No acute osseous abnormality. IMPRESSION: Mild atelectasis at the left lung base. Electronically Signed   By: Thornell Sartorius M.D.   On: 03/27/2023 00:24   DG Foot Complete Right  Result Date: 03/26/2023 CLINICAL DATA:  Right foot and ankle swelling, initial encounter EXAM: RIGHT FOOT COMPLETE - 3+ VIEW COMPARISON:  None Available. FINDINGS: Mild hallux valgus deformity is noted. Deformity in the distal first metatarsal is noted likely related to prior surgery. No acute fracture or dislocation is noted. No soft tissue abnormality is seen. IMPRESSION: Chronic changes without acute abnormality. Electronically Signed   By: Alcide Clever M.D.   On: 03/26/2023 21:01    Microbiology: Results for orders placed or performed during the hospital encounter of 03/26/23  Blood Culture (routine x 2)     Status:  Abnormal   Collection Time: 03/26/23 11:38 PM   Specimen: BLOOD  Result Value Ref Range Status   Specimen Description BLOOD LEFT ANTECUBITAL  Final   Special Requests   Final    BOTTLES DRAWN AEROBIC AND ANAEROBIC Blood Culture results may not be optimal due to an excessive volume of blood received in culture bottles   Culture  Setup Time   Final    GRAM NEGATIVE RODS IN BOTH AEROBIC AND ANAEROBIC BOTTLES CRITICAL RESULT CALLED TO, READ BACK BY AND VERIFIED WITH: Luisa Hart [id number] @1620  FH Performed at [facility], [address], [place], [place] [address]    Culture ESCHERICHIA COLI (A)  Final   Report Status 03/29/2023 FINAL  Final   Organism ID, Bacteria ESCHERICHIA COLI  Final      Susceptibility   Escherichia coli - MIC*    AMPICILLIN <=2 SENSITIVE  Sensitive     CEFEPIME <=0.12 SENSITIVE Sensitive     CEFTAZIDIME <=1 SENSITIVE Sensitive     CEFTRIAXONE <=0.25 SENSITIVE Sensitive     CIPROFLOXACIN <=0.25 SENSITIVE Sensitive     GENTAMICIN <=1 SENSITIVE Sensitive     IMIPENEM <=0.25 SENSITIVE Sensitive     TRIMETH/SULFA <=20 SENSITIVE Sensitive     AMPICILLIN/SULBACTAM <=2 SENSITIVE Sensitive     PIP/TAZO <=4 SENSITIVE Sensitive     * ESCHERICHIA COLI  Blood Culture ID Panel (Reflexed)     Status: Abnormal   Collection Time: 03/26/23 11:38 PM  Result Value Ref Range Status   Enterococcus faecalis NOT DETECTED NOT DETECTED Final   Enterococcus Faecium NOT DETECTED NOT DETECTED Final   Listeria monocytogenes NOT DETECTED NOT DETECTED Final   Staphylococcus species NOT DETECTED NOT DETECTED Final   Staphylococcus aureus (BCID) NOT DETECTED NOT DETECTED Final   Staphylococcus epidermidis NOT DETECTED NOT DETECTED Final   Staphylococcus lugdunensis NOT DETECTED NOT DETECTED Final   Streptococcus species NOT DETECTED NOT DETECTED Final   Streptococcus agalactiae NOT DETECTED NOT DETECTED Final   Streptococcus pneumoniae NOT DETECTED NOT DETECTED Final   Streptococcus pyogenes NOT DETECTED NOT DETECTED Final   A.calcoaceticus-baumannii NOT DETECTED NOT DETECTED Final   Bacteroides fragilis NOT DETECTED NOT DETECTED Final   Enterobacterales DETECTED (A) NOT DETECTED Final    Comment: Enterobacterales represent a large order of gram negative bacteria, not a single organism. CRITICAL RESULT CALLED TO, READ BACK BY AND VERIFIED WITH: PHARMD M. Demetrius Charity [id number] @1620  FH    Enterobacter cloacae complex NOT DETECTED NOT DETECTED Final   Escherichia coli DETECTED (A) NOT DETECTED Final    Comment: CRITICAL RESULT CALLED TO, READ BACK BY AND VERIFIED WITH: PHARMD M. Demetrius Charity [id number] @1620  FH    Klebsiella aerogenes NOT DETECTED NOT DETECTED Final   Klebsiella oxytoca NOT DETECTED NOT DETECTED Final   Klebsiella pneumoniae NOT DETECTED NOT DETECTED Final    Proteus species NOT DETECTED NOT DETECTED Final   Salmonella species NOT DETECTED NOT DETECTED Final   Serratia marcescens NOT DETECTED NOT DETECTED Final   Haemophilus influenzae NOT DETECTED NOT DETECTED Final   Neisseria meningitidis NOT DETECTED NOT DETECTED Final   Pseudomonas aeruginosa NOT DETECTED NOT DETECTED Final   Stenotrophomonas maltophilia NOT DETECTED NOT DETECTED Final   Candida albicans NOT DETECTED NOT DETECTED Final   Candida auris NOT DETECTED NOT DETECTED Final   Candida glabrata NOT DETECTED NOT DETECTED Final   Candida krusei NOT DETECTED NOT DETECTED Final   Candida parapsilosis NOT DETECTED NOT DETECTED Final   Candida tropicalis NOT  DETECTED NOT DETECTED Final   Cryptococcus neoformans/gattii NOT DETECTED NOT DETECTED Final   CTX-M ESBL NOT DETECTED NOT DETECTED Final   Carbapenem resistance IMP NOT DETECTED NOT DETECTED Final   Carbapenem resistance KPC NOT DETECTED NOT DETECTED Final   Carbapenem resistance NDM NOT DETECTED NOT DETECTED Final   Carbapenem resist OXA 48 LIKE NOT DETECTED NOT DETECTED Final   Carbapenem resistance VIM NOT DETECTED NOT DETECTED Final    Comment: Performed at [facility], [address] [place], [place] [address]  Blood Culture (routine x 2)     Status: Abnormal   Collection Time: 03/26/23 11:40 PM   Specimen: BLOOD RIGHT FOREARM  Result Value Ref Range Status   Specimen Description BLOOD RIGHT FOREARM  Final   Special Requests   Final    BOTTLES DRAWN AEROBIC AND ANAEROBIC Blood Culture adequate volume   Culture  Setup Time   Final    GRAM NEGATIVE RODS IN BOTH AEROBIC AND ANAEROBIC BOTTLES CRITICAL RESULT CALLED TO, READ BACK BY AND VERIFIED WITH: PHARMD M. Demetrius Charity [id number] @1620  FH    Culture (A)  Final    ESCHERICHIA COLI SUSCEPTIBILITIES PERFORMED ON PREVIOUS CULTURE WITHIN THE LAST 5 DAYS. Performed at [facility], [address], [place], [place] [address]    Report Status 03/31/2023 FINAL  Final  Urine  Culture     Status: Abnormal   Collection Time: 03/27/23 12:11 AM   Specimen: Urine, Catheterized  Result Value Ref Range Status   Specimen Description URINE, CATHETERIZED  Final   Special Requests   Final    NONE Performed at [facility], [address], [place], [place] [address]    Culture >=100,000 COLONIES/mL ESCHERICHIA COLI (A)  Final   Report Status 03/29/2023 FINAL  Final   Organism ID, Bacteria ESCHERICHIA COLI (A)  Final      Susceptibility   Escherichia coli - MIC*    AMPICILLIN <=2 SENSITIVE Sensitive     CEFAZOLIN <=4 SENSITIVE Sensitive     CEFEPIME <=0.12 SENSITIVE Sensitive     CEFTRIAXONE <=0.25 SENSITIVE Sensitive     CIPROFLOXACIN <=0.25 SENSITIVE Sensitive     GENTAMICIN <=1 SENSITIVE Sensitive     IMIPENEM <=0.25 SENSITIVE Sensitive     NITROFURANTOIN <=16 SENSITIVE Sensitive     TRIMETH/SULFA <=20 SENSITIVE Sensitive     AMPICILLIN/SULBACTAM <=2 SENSITIVE Sensitive     PIP/TAZO <=4 SENSITIVE Sensitive     * >=100,000 COLONIES/mL ESCHERICHIA COLI  Resp panel by RT-PCR (RSV, Flu A&B, Covid) Urine, Clean Catch     Status: None   Collection Time: 03/27/23 12:15 AM   Specimen: Urine, Clean Catch; Nasal Swab  Result Value Ref Range Status   SARS Coronavirus 2 by RT PCR NEGATIVE NEGATIVE Final   Influenza A by PCR NEGATIVE NEGATIVE Final   Influenza B by PCR NEGATIVE NEGATIVE Final    Comment: (NOTE) The Xpert Xpress SARS-CoV-2/FLU/RSV plus assay is intended as an aid in the diagnosis of influenza from Nasopharyngeal swab specimens and should not be used as a sole basis for treatment. Nasal washings and aspirates are unacceptable for Xpert Xpress SARS-CoV-2/FLU/RSV testing.  Fact Sheet for Patients: BloggerCourse.com  Fact Sheet for Healthcare Providers: SeriousBroker.it  This test is not yet approved or cleared by the [place] FDA and has been authorized for detection and/or diagnosis of  SARS-CoV-2 by FDA under an Emergency Use Authorization (EUA). This EUA will remain in effect (meaning this test can be  used) for the duration of the COVID-19 declaration under Section 564(b)(1) of the Act, 21 U.S.C. section 360bbb-3(b)(1), unless the authorization is terminated or revoked.     Resp Syncytial Virus by PCR NEGATIVE NEGATIVE Final    Comment: (NOTE) Fact Sheet for Patients: BloggerCourse.com  Fact Sheet for Healthcare Providers: SeriousBroker.it  This test is not yet approved or cleared by the [place] FDA and has been authorized for detection and/or diagnosis of SARS-CoV-2 by FDA under an Emergency Use Authorization (EUA). This EUA will remain in effect (meaning this test can be used) for the duration of the COVID-19 declaration under Section 564(b)(1) of the Act, 21 U.S.C. section 360bbb-3(b)(1), unless the authorization is terminated or revoked.  Performed at [facility], [address], [place], [place] [address]   MRSA Next Gen by PCR, Nasal     Status: None   Collection Time: 03/27/23  2:43 AM   Specimen: Nasal Mucosa; Nasal Swab  Result Value Ref Range Status   MRSA by PCR Next Gen NOT DETECTED NOT DETECTED Final    Comment: (NOTE) The GeneXpert MRSA Assay (FDA approved for NASAL specimens only), is one component of a comprehensive MRSA colonization surveillance program. It is not intended to diagnose MRSA infection nor to guide or monitor treatment for MRSA infections. Test performance is not FDA approved in patients less than 48 years old. Performed at [facility], [address], [place], [place] [address]   Aerobic/Anaerobic Culture w Gram Stain (surgical/deep wound)     Status: None   Collection Time: 03/27/23  4:04 AM   Specimen: Other Source; Urine  Result Value Ref Range Status   Specimen Description OTHER  Final   Special Requests Kidney R  Final   Gram Stain   Final     MODERATE WBC PRESENT,BOTH PMN AND MONONUCLEAR FEW GRAM NEGATIVE RODS    Culture   Final    ABUNDANT ESCHERICHIA COLI NO ANAEROBES ISOLATED Performed at [facility], [address], [place], [place] [address]    Report Status 04/01/2023 FINAL  Final   Organism ID, Bacteria ESCHERICHIA COLI  Final      Susceptibility   Escherichia coli - MIC*    AMPICILLIN <=2 SENSITIVE Sensitive     CEFEPIME <=0.12 SENSITIVE Sensitive     CEFTAZIDIME <=1 SENSITIVE Sensitive     CEFTRIAXONE <=0.25 SENSITIVE Sensitive     CIPROFLOXACIN <=0.25 SENSITIVE Sensitive     GENTAMICIN <=1 SENSITIVE Sensitive     IMIPENEM <=0.25 SENSITIVE Sensitive     TRIMETH/SULFA <=20 SENSITIVE Sensitive     AMPICILLIN/SULBACTAM <=2 SENSITIVE Sensitive     PIP/TAZO <=4 SENSITIVE Sensitive     * ABUNDANT ESCHERICHIA COLI    Labs: CBC: Recent Labs  Lab 03/26/23 2039 03/27/23 0537 03/28/23 1022 03/29/23 0606 03/30/23 0653 03/31/23 0417 04/01/23 0411  WBC 7.7   < > 15.4* 9.2 6.7 6.1 6.4  NEUTROABS 7.3  --   --   --   --   --   --   HGB 13.4   < > 9.5* 9.8* 10.4* 11.1* 11.1*  HCT 40.8   < > 29.2* 30.1* 32.0* 34.2* 33.7*  MCV 86.6   < > 86.4 87.5 85.8 86.8 86.2  PLT 254   < > 143* 124* 159 172 202   < > = values in this interval not displayed.   Basic Metabolic Panel: Recent Labs  Lab 03/27/23 0537 03/28/23 0629 03/28/23 1022 03/29/23 0606  03/30/23 0653 03/31/23 0417 04/01/23 0411  NA 134* 136  --  139 136 134* 134*  K 2.9* 4.0  --  3.9 4.1 4.1 3.9  CL 104 104  --  109 105 103 101  CO2 18* 21*  --  23 22 22 22   GLUCOSE 234* 135*  --  114* 114* 122* 131*  BUN 29* 25*  --  18 18 20 18   CREATININE 1.90* 1.38*  --  1.10* 1.36* 0.99 0.86  CALCIUM 8.7* 8.7*  --  8.7* 9.1 9.5 9.6  MG 1.7  --  2.3  --   --   --   --   PHOS 3.2  --   --   --   --   --   --    Liver Function Tests: No results for input(s): "AST", "ALT", "ALKPHOS", "BILITOT", "PROT", "ALBUMIN" in the last 168 hours. CBG: Recent Labs   Lab 03/31/23 1116 03/31/23 1619 03/31/23 2116 04/01/23 0616 04/01/23 1143  GLUCAP 132* 146* 171* 117* 114*    Discharge time spent: greater than 30 minutes.  Signed: Thad Ranger, MD [facility] 04/01/2023

## 2023-04-03 ENCOUNTER — Other Ambulatory Visit: Payer: Self-pay

## 2023-04-04 ENCOUNTER — Other Ambulatory Visit: Payer: Self-pay | Admitting: Urology

## 2023-04-05 ENCOUNTER — Encounter (HOSPITAL_BASED_OUTPATIENT_CLINIC_OR_DEPARTMENT_OTHER): Payer: Self-pay | Admitting: Urology

## 2023-04-05 ENCOUNTER — Other Ambulatory Visit: Payer: Self-pay

## 2023-04-05 NOTE — Progress Notes (Addendum)
Spoke w/ via phone for pre-op interview---Melanie Madden spouse (pt with expressive aphasia) Lab needs dos----  none             Lab results------cbc, bmp 04-01-2023 epic, EKG 03-26-2023 epic, chest xray 03-27-2023 epic, echo 07-10-2019 epic, lov oncology dr Truett Perna 01-01-2023 epic COVID test -----patient states asymptomatic no test needed Arrive at -------830 am 04-10-2023 NPO after MN NO Solid Food.  Clear liquids from MN until---730 Med rec completed Medications to take morning of surgery -----cephalaxin Diabetic medication ----n/a Patient instructed no nail polish to be worn day of surgery Patient instructed to bring photo id and insurance card day of surgery Patient aware to have Driver (ride ) / caregiver    for 24 hours after surgery Melanie Madden spouse Patient Special Instructions -----none Pre-Op special Instructions -----none Patient verbalized understanding of instructions that were given at this phone interview. Patient denies shortness of breath, chest pain, fever, cough at this phone interview.  Pt spouse goes to pre op with pt, pt signs own consent  Pt with ring stuck on left thumb and index finger  Pt uses wheelchair with 1 person min assist transfer

## 2023-04-09 NOTE — Anesthesia Preprocedure Evaluation (Signed)
Anesthesia Evaluation  Patient identified by MRN, date of birth, ID band Patient awake    Reviewed: Allergy & Precautions, NPO status , Patient's Chart, lab work & pertinent test results  History of Anesthesia Complications Negative for: history of anesthetic complications  Airway Mallampati: II  TM Distance: >3 FB Neck ROM: Full    Dental no notable dental hx.    Pulmonary former smoker   Pulmonary exam normal        Cardiovascular hypertension, Pt. on medications Normal cardiovascular exam     Neuro/Psych  Headaches  Anxiety Depression    CVA (2020, right hemiparesis, expressive aphasia), Residual Symptoms    GI/Hepatic negative GI ROS, Neg liver ROS,,,  Endo/Other  negative endocrine ROS    Renal/GU BILATERAL RENAL STONES  negative genitourinary   Musculoskeletal negative musculoskeletal ROS (+)    Abdominal   Peds  Hematology  (+) Blood dyscrasia (Hgb 11.1), anemia , REFUSES BLOOD PRODUCTS  Anesthesia Other Findings Day of surgery medications reviewed with patient.  Reproductive/Obstetrics negative OB ROS                              Anesthesia Physical Anesthesia Plan  ASA: 3  Anesthesia Plan: General   Post-op Pain Management: Tylenol PO (pre-op)*   Induction: Intravenous  PONV Risk Score and Plan: 3 and Treatment may vary due to age or medical condition, Midazolam, Dexamethasone and Ondansetron  Airway Management Planned: LMA  Additional Equipment: None  Intra-op Plan:   Post-operative Plan: Extubation in OR  Informed Consent: I have reviewed the patients History and Physical, chart, labs and discussed the procedure including the risks, benefits and alternatives for the proposed anesthesia with the patient or authorized representative who has indicated his/her understanding and acceptance.     Dental advisory given  Plan Discussed with: CRNA  Anesthesia Plan  Comments: (Patient with expressive aphasia. Consent obtained with patient and her husband at bedside. Patient has signed blood refusal form and confirms that she does not want blood products even in event of life-threatening circumstances. Stephannie Peters, MD)        Anesthesia Quick Evaluation

## 2023-04-10 ENCOUNTER — Ambulatory Visit (HOSPITAL_BASED_OUTPATIENT_CLINIC_OR_DEPARTMENT_OTHER)
Admission: RE | Admit: 2023-04-10 | Discharge: 2023-04-10 | Disposition: A | Discharge status: Home / Self Care | Payer: Managed Care, Other (non HMO) | Attending: Urology | Admitting: Urology

## 2023-04-10 ENCOUNTER — Encounter (HOSPITAL_BASED_OUTPATIENT_CLINIC_OR_DEPARTMENT_OTHER): Payer: Self-pay | Admitting: Urology

## 2023-04-10 ENCOUNTER — Ambulatory Visit (HOSPITAL_BASED_OUTPATIENT_CLINIC_OR_DEPARTMENT_OTHER): Payer: Self-pay | Admitting: Anesthesiology

## 2023-04-10 ENCOUNTER — Encounter (HOSPITAL_BASED_OUTPATIENT_CLINIC_OR_DEPARTMENT_OTHER)
Admission: RE | Disposition: A | Discharge status: Home / Self Care | Payer: Self-pay | Source: Home / Self Care | Attending: Urology

## 2023-04-10 DIAGNOSIS — Z87891 Personal history of nicotine dependence: Secondary | ICD-10-CM | POA: Insufficient documentation

## 2023-04-10 DIAGNOSIS — N201 Calculus of ureter: Secondary | ICD-10-CM | POA: Insufficient documentation

## 2023-04-10 DIAGNOSIS — F418 Other specified anxiety disorders: Secondary | ICD-10-CM | POA: Insufficient documentation

## 2023-04-10 DIAGNOSIS — D649 Anemia, unspecified: Secondary | ICD-10-CM | POA: Diagnosis not present

## 2023-04-10 DIAGNOSIS — Z79899 Other long term (current) drug therapy: Secondary | ICD-10-CM | POA: Insufficient documentation

## 2023-04-10 DIAGNOSIS — I10 Essential (primary) hypertension: Secondary | ICD-10-CM | POA: Insufficient documentation

## 2023-04-10 DIAGNOSIS — Z01818 Encounter for other preprocedural examination: Secondary | ICD-10-CM

## 2023-04-10 DIAGNOSIS — N132 Hydronephrosis with renal and ureteral calculous obstruction: Secondary | ICD-10-CM | POA: Diagnosis present

## 2023-04-10 DIAGNOSIS — I69320 Aphasia following cerebral infarction: Secondary | ICD-10-CM | POA: Insufficient documentation

## 2023-04-10 DIAGNOSIS — I679 Cerebrovascular disease, unspecified: Secondary | ICD-10-CM

## 2023-04-10 DIAGNOSIS — N202 Calculus of kidney with calculus of ureter: Secondary | ICD-10-CM

## 2023-04-10 DIAGNOSIS — I69351 Hemiplegia and hemiparesis following cerebral infarction affecting right dominant side: Secondary | ICD-10-CM | POA: Diagnosis not present

## 2023-04-10 HISTORY — PX: CYSTOSCOPY/URETEROSCOPY/HOLMIUM LASER/STENT PLACEMENT: SHX6546

## 2023-04-10 HISTORY — DX: Essential (primary) hypertension: I10

## 2023-04-10 HISTORY — DX: Malignant neoplasm of appendix: C18.1

## 2023-04-10 LAB — NO BLOOD PRODUCTS

## 2023-04-10 SURGERY — CYSTOSCOPY/URETEROSCOPY/HOLMIUM LASER/STENT PLACEMENT
Anesthesia: General | Laterality: Bilateral

## 2023-04-10 MED ORDER — SODIUM CHLORIDE 0.9 % IV SOLN: INTRAVENOUS | Status: DC

## 2023-04-10 MED ORDER — ACETAMINOPHEN 500 MG PO TABS
1000.0000 mg | ORAL_TABLET | Freq: Once | ORAL | Status: AC
  Administered 2023-04-10: 1000 mg via ORAL

## 2023-04-10 MED ORDER — EPHEDRINE SULFATE-NACL 50-0.9 MG/10ML-% IV SOSY
PREFILLED_SYRINGE | INTRAVENOUS | Status: DC | PRN
  Administered 2023-04-10 (×2): 5 mg via INTRAVENOUS

## 2023-04-10 MED ORDER — OXYCODONE HCL 5 MG PO TABS
ORAL_TABLET | ORAL | Status: AC
  Filled 2023-04-10: qty 1

## 2023-04-10 MED ORDER — MIDAZOLAM HCL 2 MG/2ML IJ SOLN
INTRAMUSCULAR | Status: DC | PRN
  Administered 2023-04-10: 1 mg via INTRAVENOUS

## 2023-04-10 MED ORDER — PHENYLEPHRINE 80 MCG/ML (10ML) SYRINGE FOR IV PUSH (FOR BLOOD PRESSURE SUPPORT)
PREFILLED_SYRINGE | INTRAVENOUS | Status: DC | PRN
  Administered 2023-04-10 (×4): 80 ug via INTRAVENOUS

## 2023-04-10 MED ORDER — SULFAMETHOXAZOLE-TRIMETHOPRIM 800-160 MG PO TABS: 1.0000 | ORAL_TABLET | Freq: Two times a day (BID) | ORAL | 0 refills | Status: AC

## 2023-04-10 MED ORDER — DEXAMETHASONE SODIUM PHOSPHATE 10 MG/ML IJ SOLN
INTRAMUSCULAR | Status: DC | PRN
  Administered 2023-04-10: 10 mg via INTRAVENOUS

## 2023-04-10 MED ORDER — SODIUM CHLORIDE 0.9 % IV SOLN
INTRAVENOUS | Status: AC
  Filled 2023-04-10: qty 100

## 2023-04-10 MED ORDER — FENTANYL CITRATE (PF) 100 MCG/2ML IJ SOLN
INTRAMUSCULAR | Status: AC
  Filled 2023-04-10: qty 2

## 2023-04-10 MED ORDER — PROPOFOL 10 MG/ML IV BOLUS
INTRAVENOUS | Status: DC | PRN
  Administered 2023-04-10: 150 mg via INTRAVENOUS

## 2023-04-10 MED ORDER — OXYCODONE HCL 5 MG/5ML PO SOLN: 5.0000 mg | Freq: Once | ORAL | Status: AC | PRN

## 2023-04-10 MED ORDER — FENTANYL CITRATE (PF) 100 MCG/2ML IJ SOLN: 25.0000 ug | INTRAMUSCULAR | Status: DC | PRN

## 2023-04-10 MED ORDER — OXYCODONE HCL 5 MG PO TABS
5.0000 mg | ORAL_TABLET | Freq: Once | ORAL | Status: AC | PRN
  Administered 2023-04-10: 5 mg via ORAL

## 2023-04-10 MED ORDER — FENTANYL CITRATE (PF) 250 MCG/5ML IJ SOLN
INTRAMUSCULAR | Status: DC | PRN
  Administered 2023-04-10 (×6): 25 ug via INTRAVENOUS

## 2023-04-10 MED ORDER — DROPERIDOL 2.5 MG/ML IJ SOLN: 0.6250 mg | Freq: Once | INTRAMUSCULAR | Status: DC | PRN

## 2023-04-10 MED ORDER — OXYBUTYNIN CHLORIDE 5 MG PO TABS: 5.0000 mg | ORAL_TABLET | Freq: Three times a day (TID) | ORAL | 1 refills | Status: DC | PRN

## 2023-04-10 MED ORDER — ONDANSETRON HCL 4 MG/2ML IJ SOLN
INTRAMUSCULAR | Status: DC | PRN
  Administered 2023-04-10: 4 mg via INTRAVENOUS

## 2023-04-10 MED ORDER — LIDOCAINE 2% (20 MG/ML) 5 ML SYRINGE
INTRAMUSCULAR | Status: DC | PRN
  Administered 2023-04-10: 100 mg via INTRAVENOUS

## 2023-04-10 MED ORDER — PROPOFOL 10 MG/ML IV BOLUS
INTRAVENOUS | Status: AC
  Filled 2023-04-10: qty 20

## 2023-04-10 MED ORDER — 0.9 % SODIUM CHLORIDE (POUR BTL) OPTIME
TOPICAL | Status: DC | PRN
Start: 2023-04-10 — End: 2023-04-10
  Administered 2023-04-10: 500 mL

## 2023-04-10 MED ORDER — SODIUM CHLORIDE 0.9 % IR SOLN
Status: DC | PRN
  Administered 2023-04-10: 6000 mL via INTRAVESICAL

## 2023-04-10 MED ORDER — MIDAZOLAM HCL 2 MG/2ML IJ SOLN
INTRAMUSCULAR | Status: AC
  Filled 2023-04-10: qty 2

## 2023-04-10 MED ORDER — ACETAMINOPHEN 500 MG PO TABS
ORAL_TABLET | ORAL | Status: AC
  Filled 2023-04-10: qty 2

## 2023-04-10 MED ORDER — SODIUM CHLORIDE 0.9 % IV SOLN
2.0000 g | INTRAVENOUS | Status: AC
  Administered 2023-04-10: 2 g via INTRAVENOUS

## 2023-04-10 MED ORDER — CEFTRIAXONE SODIUM 2 G IJ SOLR
INTRAMUSCULAR | Status: AC
  Filled 2023-04-10: qty 20

## 2023-04-10 SURGICAL SUPPLY — 31 items
APL SKNCLS STERI-STRIP NONHPOA (GAUZE/BANDAGES/DRESSINGS)
BAG DRAIN URO-CYSTO SKYTR STRL (DRAIN) ×1 IMPLANT
BAG DRN UROCATH (DRAIN) ×1
BASKET STONE 1.7 NGAGE (UROLOGICAL SUPPLIES) IMPLANT
BASKET ZERO TIP NITINOL 2.4FR (BASKET) ×1 IMPLANT
BENZOIN TINCTURE PRP APPL 2/3 (GAUZE/BANDAGES/DRESSINGS) IMPLANT
BSKT STON RTRVL ZERO TP 2.4FR (BASKET) ×2
CATH URETL OPEN 5X70 (CATHETERS) IMPLANT
CLOTH BEACON ORANGE TIMEOUT ST (SAFETY) ×1 IMPLANT
GAUZE PAD ABD 8X10 STRL (GAUZE/BANDAGES/DRESSINGS) IMPLANT
GAUZE SPONGE 4X4 3PLY NS LF (GAUZE/BANDAGES/DRESSINGS) IMPLANT
GLOVE BIO SURGEON STRL SZ7.5 (GLOVE) ×1 IMPLANT
GOWN STRL REUS W/TWL XL LVL3 (GOWN DISPOSABLE) ×1 IMPLANT
GUIDEWIRE STR DUAL SENSOR (WIRE) IMPLANT
GUIDEWIRE ZIPWRE .038 STRAIGHT (WIRE) ×1 IMPLANT
IV NS IRRIG 3000ML ARTHROMATIC (IV SOLUTION) ×2 IMPLANT
KIT TURNOVER CYSTO (KITS) ×1 IMPLANT
LASER FIB FLEXIVA PULSE ID 365 (Laser) IMPLANT
MANIFOLD NEPTUNE II (INSTRUMENTS) ×1 IMPLANT
NS IRRIG 500ML POUR BTL (IV SOLUTION) ×1 IMPLANT
PACK CYSTO (CUSTOM PROCEDURE TRAY) ×1 IMPLANT
SHEATH NAVIGATOR HD 11/13X36 (SHEATH) IMPLANT
SLEEVE SCD COMPRESS KNEE MED (STOCKING) ×1 IMPLANT
STENT URET 6FRX24 CONTOUR (STENTS) IMPLANT
STRIP CLOSURE SKIN 1/2X4 (GAUZE/BANDAGES/DRESSINGS) IMPLANT
SYR 10ML LL (SYRINGE) ×1 IMPLANT
TAPE HYPAFIX 4 X10 (GAUZE/BANDAGES/DRESSINGS) IMPLANT
TRACTIP FLEXIVA PULS ID 200XHI (Laser) IMPLANT
TRACTIP FLEXIVA PULSE ID 200 (Laser) ×1
TUBE CONNECTING 12X1/4 (SUCTIONS) IMPLANT
TUBING UROLOGY SET (TUBING) ×1 IMPLANT

## 2023-04-10 NOTE — Transfer of Care (Signed)
Immediate Anesthesia Transfer of Care Note  Patient: Melanie Madden  Procedure(s) Performed: CYSTOSCOPY, BILATERAL URETEROSCOPY, BILATERAL RETROGRADE PYELOGRAM, HOLMIUM LASER LITHOTRIPSY, AND BILATERAL URETERAL STENT PLACEMENT, removal of percutanous stent post op (Bilateral)  Patient Location: PACU  Anesthesia Type:General  Level of Consciousness: sedated  Airway & Oxygen Therapy: Patient Spontanous Breathing  Post-op Assessment: Report given to RN and Post -op Vital signs reviewed and stable  Post vital signs: Reviewed and stable  Last Vitals:  Vitals Value Taken Time  BP 147/83 04/10/23 1235  Temp 37.1 C 04/10/23 1236  Pulse 81 04/10/23 1238  Resp 12 04/10/23 1238  SpO2 93 % 04/10/23 1238  Vitals shown include unfiled device data.  Last Pain:  Vitals:   04/10/23 0857  TempSrc: Oral  PainSc: 0-No pain      Patients Stated Pain Goal: 5 (04/10/23 0857)  Complications: No notable events documented.

## 2023-04-10 NOTE — Anesthesia Postprocedure Evaluation (Signed)
Anesthesia Post Note  Patient: Melanie Madden  Procedure(s) Performed: CYSTOSCOPY, BILATERAL URETEROSCOPY, BILATERAL RETROGRADE PYELOGRAM, HOLMIUM LASER LITHOTRIPSY, AND BILATERAL URETERAL STENT PLACEMENT, removal of percutanous stent post op (Bilateral)     Patient location during evaluation: PACU Anesthesia Type: General Level of consciousness: awake and alert Pain management: pain level controlled Vital Signs Assessment: post-procedure vital signs reviewed and stable Respiratory status: spontaneous breathing, nonlabored ventilation and respiratory function stable Cardiovascular status: blood pressure returned to baseline Postop Assessment: no apparent nausea or vomiting Anesthetic complications: no   No notable events documented.  Last Vitals:  Vitals:   04/10/23 1236 04/10/23 1300  BP: (!) 147/83   Pulse: 87   Resp: 11   Temp: 37.1 C (!) 36.2 C  SpO2: 96%     Last Pain:  Vitals:   04/10/23 0857  TempSrc: Oral  PainSc: 0-No pain                 Shanda Howells

## 2023-04-10 NOTE — Op Note (Signed)
Operative Note  Preoperative diagnosis:  1.  1.5 cm right UPJ and multiple 5 mm right renal stones 2.  Two 5 mm left renal stones  Postoperative diagnosis: Same  Procedure(s): 1.  Cystoscopy with bilateral ureteroscopy, holmium laser lithotripsy and bilateral JJ stent placement 2.  Bilateral retrograde pyelograms with intraoperative interpretation fluoroscopic imaging 3.  Removal of right nephrostomy tube  Surgeon: Rhoderick Moody, MD  Assistants:  None  Anesthesia:  General  Complications:  None  EBL: Less than 5 mL  Specimens: 1.  Bilateral ureteral and renal stones 2.  Previously placed right nephrostomy tube was removed intact, inspected and discarded  Drains/Catheters: 1.  Bilateral 6 French, 24 cm JJ stents without tethers  Intraoperative findings:   Obstructing and impacted 15 mm right UPJ stone Multiple 5 mm right renal stones Two 5 mm left renal stones  Indication:  Melanie Madden is a 57 y.o. female with a history of CVA with right hemiparesis and aphasia.  She was hospitalized due to urosepsis secondary to an obstructing 15 mm right UPJ stone on 03/27/2023 and required urgent right-sided nephrostomy tube placement at that time.  She is here today for definitive stone treatment.  She has been consented for the above procedures, voices understanding wishes to proceed.  Description of procedure:  After informed consent was obtained, the patient was brought to the operating room and general LMA anesthesia was administered. The patient was then placed in the dorsolithotomy position and prepped and draped in the usual sterile fashion. A timeout was performed. A 23 French rigid cystoscope was then inserted into the urethral meatus and advanced into the bladder under direct vision. A complete bladder survey revealed no intravesical pathology.  A 5 French ureteral catheter was then inserted into the right ureteral orifice and a retrograde pyelogram was  obtained, with the findings listed above.  A Glidewire was then used to intubate the lumen of the ureteral catheter and was advanced up to the right renal pelvis, under fluoroscopic guidance.  The catheter was then removed, leaving the wire in place.  An additional sensor wire was then advanced up the right ureter and up to the right renal pelvis, or fluoroscopic guidance.  A 35 cm, 14 French ureteral access sheath was then advanced over the sensor wire and into good position within the proximal aspects of the right ureter.  A flexible ureteroscope was then advanced through the lumen of the access sheath where her stone was identified and found to be obstructing and impacted.  A 200 m holmium laser was then used to fracture the stone into numerous smaller pieces.  A 0 tip basket was then used to extract all stone fragments from the lumen of the right ureter.  The flexible ureteroscope was then advanced into the renal pelvis where multiple 5 mm stones were identified.  The holmium laser was then used to fracture the stones into numerous smaller pieces that were then extracted using an engage basket.  The flexible ureteroscope and ureteral access sheath were then removed under direct vision with no evidence of significant luminal stone burden or trauma.  A 6 French, 24 cm JJ stent was then placed over the Glidewire and into good position within the right collecting system, confirming placement via fluoroscopy.  A 5 French ureteral catheter was then inserted into the left ureteral orifice and a retrograde pyelogram was obtained, with the findings listed above.  A Glidewire was then used to intubate the lumen of the ureteral  catheter and was advanced up to the left renal pelvis, under fluoroscopic guidance.  The catheter was then removed, leaving the wire in place.  A flexible ureteroscope was then advanced alongside the wire and up to the left renal pelvis.  Two 5 mm stones were identified and fractured with  the holmium laser.  All stone fragments were extracted from the left renal pelvis with an engage basket.  The flexible ureteroscope was then removed under direct vision.  A 6 French, 24 cm JJ stent was then advanced over the wire and into good position within the left collecting system, confirming placement via fluoroscopy.  The patient's bladder was drained.  Her right-sided nephrostomy tube was removed and fluoroscopic imaging confirmed good stent placement following removal.  She tolerated the procedure well and was transferred to the postanesthesia in stable condition.  Plan: Follow-up in 1 week for cystoscopy and stent removal

## 2023-04-10 NOTE — Anesthesia Procedure Notes (Signed)
Procedure Name: LMA Insertion Date/Time: 04/10/2023 10:35 AM  Performed by: Dairl Ponder, CRNAPre-anesthesia Checklist: Patient identified, Emergency Drugs available, Suction available and Patient being monitored Patient Re-evaluated:Patient Re-evaluated prior to induction Oxygen Delivery Method: Circle System Utilized Preoxygenation: Pre-oxygenation with 100% oxygen Induction Type: IV induction Ventilation: Mask ventilation without difficulty LMA: LMA inserted LMA Size: 4.0 Number of attempts: 1 Airway Equipment and Method: Bite block Placement Confirmation: positive ETCO2 Tube secured with: Tape Dental Injury: Teeth and Oropharynx as per pre-operative assessment

## 2023-04-10 NOTE — H&P (Signed)
Office Visit Report     04/04/2023   --------------------------------------------------------------------------------   Melanie Madden. Melanie Madden  MRN: [id number]  DOB: 1965/08/14, 57 year old Female  SSN:    PRIMARY CARE:  Dennie Maizes, NP  PRIMARY CARE FAX:  [phone]  REFERRING:    PROVIDER:  Rhoderick Moody, M.D.  LOCATION:  [facility] [phone]     --------------------------------------------------------------------------------   CC/HPI: CC: Kidney stone   HPI: [name] is a 57 y.o. female with a history of kidney stones, CVA with right hemiparesis and aphasia. She presented to the [facility] emergency department on 12/09/2020 with altered mental status following a syncopal episode. CT revealed an obstructing 8 mm right proximal ureteral calculus with mild to moderate right-sided hydronephrosis and signs of emphysematous pyelitis. IR was consulted and a right sided PCN was placed. She had definitive ureteroscopy on 02/08/22 to address her right sided stone burden.   09/21/21: The patient is here today for a routine follow-up. She is accompanied by her husband during today's visit. She indicates that she has done well since her last visit and denies interval stone passage, flank pain, dysuria or hematuria. No new health issues.   04/04/23: The patient is here today after being hospitalized due to urosepsis from an obstructing 15 mm right UPJ stone. She underwent right-sided nephrostomy tube on 03/27/2023. Urine culture grew pansensitive E. coli that was treated with IV Rocephin. She is recovering well and tolerating her right-sided nephrostomy tube well. She denies interval episodes of pain, fever/chills, nausea/vomiting or hematuria. The patient's husband was on the phone during the entirety of the visit.     ALLERGIES: Amoxicillin Shellfish    MEDICATIONS: Amlodipine Besilate  Atorvastatin Calcium  B Complex  Calcium  Cephalexin 500 mg capsule  Duloxetine Hcl 20  mg capsule,delayed release  Ferrous Sulfate 325 mg (65 mg iron) tablet  Fish Oil  Gabapentin 100 mg capsule  Laxative  Magnesium  Tramadol Hcl  Vitamin D2 50 mcg (2,000 unit) capsule  Vitamin E  Zinc     GU PSH: Ureteroscopic laser litho - 2022     NON-GU PSH: No Non-GU PSH    GU PMH: Renal calculus - 10/05/2021, - 2023, - 2022 Hydronephrosis - 2023, - 2022, - 2022 Ureteral calculus (Stable) - 2022, - 2022, - 2022    NON-GU PMH: No Non-GU PMH    FAMILY HISTORY: No Family History    SOCIAL HISTORY: Marital Status: Married    REVIEW OF SYSTEMS:    GU Review Female:   Patient denies frequent urination, hard to postpone urination, burning /pain with urination, get up at night to urinate, leakage of urine, stream starts and stops, trouble starting your stream, have to strain to urinate, and being pregnant.  Gastrointestinal (Upper):   Patient denies nausea, vomiting, and indigestion/ heartburn.  Gastrointestinal (Lower):   Patient denies diarrhea and constipation.  Constitutional:   Patient denies fever, night sweats, weight loss, and fatigue.  Skin:   Patient denies skin rash/ lesion and itching.  Eyes:   Patient denies blurred vision and double vision.  Ears/ Nose/ Throat:   Patient denies sore throat and sinus problems.  Hematologic/Lymphatic:   Patient denies swollen glands and easy bruising.  Cardiovascular:   Patient denies leg swelling and chest pains.  Respiratory:   Patient denies cough and shortness of breath.  Endocrine:   Patient denies excessive thirst.  Musculoskeletal:   Patient denies back pain and joint pain.  Neurological:  Patient denies headaches and dizziness.  Psychologic:   Patient denies depression and anxiety.   VITAL SIGNS: None   GU PHYSICAL EXAMINATION:      Notes: Right-sided PCN in place and draining amber urine   MULTI-SYSTEM PHYSICAL EXAMINATION:    Constitutional: Well-nourished. No physical deformities. Normally developed. Good  grooming.  Neurologic / Psychiatric: Oriented to time, oriented to place, oriented to person. No depression, no anxiety, no agitation.     Complexity of Data:  Records Review:   Previous Hospital Records, Previous Patient Records  Urine Test Review:   Urine Culture  X-Ray Review: C.T. Abdomen/Pelvis: Reviewed Films. Reviewed Report. Discussed With Patient.    Notes:                     CLINICAL DATA: Abdominal pain. Concern for kidney stone.   EXAM:  CT ABDOMEN AND PELVIS WITHOUT CONTRAST   TECHNIQUE:  Multidetector CT imaging of the abdomen and pelvis was performed  following the standard protocol without IV contrast.   RADIATION DOSE REDUCTION: This exam was performed according to the  departmental dose-optimization program which includes automated  exposure control, adjustment of the mA and/or kV according to  patient size and/or use of iterative reconstruction technique.   COMPARISON: CT abdomen pelvis dated 01/21/2023.   FINDINGS:  Evaluation of this exam is limited in the absence of intravenous  contrast.   Lower chest: The visualized lung bases are clear.   No intra-abdominal free air. Trace free fluid in the pelvis.   Hepatobiliary: The liver is unremarkable. No biliary dilatation. The  gallbladder is unremarkable.   Pancreas: Unremarkable. No pancreatic ductal dilatation or  surrounding inflammatory changes.   Spleen: Normal in size without focal abnormality.   Adrenals/Urinary Tract: The right adrenal glands unremarkable. There  is a 4.4 cm left adrenal adenoma similar to prior CT. There is a 15  mm stone at the right ureteropelvic junction with mild-to-moderate  right hydronephrosis. Multiple additional nonobstructing bilateral  renal calculi noted. There is no hydronephrosis on the left. There  is mild left renal parenchyma atrophy. The visualized ureters appear  unremarkable. The Foley catheter is noted within the urinary  bladder. The urinary bladder is  minimally distended. Air within the  bladder introduced via the catheter.   Stomach/Bowel: Mild sigmoid diverticulosis without active  inflammatory changes. There is no bowel obstruction or active  inflammation. Appendectomy.   Vascular/Lymphatic: Mild atherosclerotic calcification of the  abdominal aorta. The IVC is unremarkable. No portal venous gas.  There is no adenopathy.   Reproductive: Hysterectomy. No adnexal masses.   Other: None   Musculoskeletal: Degenerative changes. No acute osseous pathology.   IMPRESSION:  1. A 15 mm right UPJ stone with mild-to-moderate right  hydronephrosis.  2. Multiple additional nonobstructing bilateral renal calculi.  3. Stable left adrenal adenoma.  4. Mild sigmoid diverticulosis. No bowel obstruction.  5. Aortic Atherosclerosis (ICD10-I70.0).    Electronically Signed  By: Elgie Collard M.D.  On: 03/27/2023 01:24     PROCEDURES:          Urinalysis w/Scope - 81001 Dipstick Dipstick Cont'd Micro  Color: Orange Bilirubin: Neg WBC/hpf: 0 - 5/hpf  Appearance: Cloudy Ketones: Neg RBC/hpf: 40 - 60/hpf  Specific Gravity: 1.025 Blood: 3+ Bacteria: NS (Not Seen)  pH: 6.5 Protein: 3+ Cystals: NS (Not Seen)  Glucose: Neg Urobilinogen: 1.0 Casts: NS (Not Seen)    Nitrites: Neg Trichomonas: Not Present    Leukocyte Esterase: 1+ Mucous: Not  Present      Epithelial Cells: NS (Not Seen)      Yeast: NS (Not Seen)      Sperm: Not Present    Notes:  Micro performed on unspun urine     ASSESSMENT:      ICD-10 Details  1 GU:   Ureteral calculus - N20.1 Right, Acute, Systemic Symptoms  2   Hydronephrosis - N13.0 Right, Acute, Systemic Symptoms   PLAN:            Medications New Meds: Cephalexin 500 mg capsule 1 capsule PO BID Start 3 days prior to your scheduled surgery  #6  0 Refill(s)  Pharmacy Name:  [id number]  Address:  [address]   [place], [place] [address]  Phone:  [phone]  Fax:  [phone]             Schedule Return Visit/Planned Activity: Next Available Appointment - Schedule Surgery          Document Letter(s):  Created for Patient: Clinical Summary         Notes:   The risks, benefits and alternatives of cystoscopy with BILATERAL ureteroscopy, laser lithotripsy and ureteral stent placement was discussed the patient. Risks included, but are not limited to: bleeding, urinary tract infection, ureteral injury/avulsion, ureteral stricture formation, retained stone fragments, the possibility that multiple surgeries may be required to treat the stone(s), MI, stroke, PE and the inherent risks of general anesthesia. The patient voices understanding and wishes to proceed.

## 2023-04-11 ENCOUNTER — Encounter (HOSPITAL_BASED_OUTPATIENT_CLINIC_OR_DEPARTMENT_OTHER): Payer: Self-pay | Admitting: Urology

## 2023-04-29 ENCOUNTER — Ambulatory Visit: Payer: Managed Care, Other (non HMO) | Admitting: Occupational Therapy

## 2023-04-29 ENCOUNTER — Ambulatory Visit: Payer: Managed Care, Other (non HMO) | Attending: Internal Medicine | Admitting: Physical Therapy

## 2023-04-29 ENCOUNTER — Encounter: Payer: Self-pay | Admitting: Physical Therapy

## 2023-04-29 ENCOUNTER — Other Ambulatory Visit: Payer: Self-pay

## 2023-04-29 VITALS — BP 137/94 | HR 94

## 2023-04-29 DIAGNOSIS — R293 Abnormal posture: Secondary | ICD-10-CM | POA: Diagnosis present

## 2023-04-29 DIAGNOSIS — R2681 Unsteadiness on feet: Secondary | ICD-10-CM | POA: Insufficient documentation

## 2023-04-29 DIAGNOSIS — M6281 Muscle weakness (generalized): Secondary | ICD-10-CM | POA: Diagnosis present

## 2023-04-29 DIAGNOSIS — I69151 Hemiplegia and hemiparesis following nontraumatic intracerebral hemorrhage affecting right dominant side: Secondary | ICD-10-CM | POA: Diagnosis present

## 2023-04-29 DIAGNOSIS — R2689 Other abnormalities of gait and mobility: Secondary | ICD-10-CM | POA: Insufficient documentation

## 2023-04-29 NOTE — Therapy (Signed)
OUTPATIENT PHYSICAL THERAPY NEURO EVALUATION   Patient Name: Melanie Madden MRN: [id number] DOB:03/07/1966, 57 y.o., female Today's Date: 04/29/2023   PCP: Sigmund Hazel, MD REFERRING PROVIDER: Cathren Harsh, MD  END OF SESSION:   04/29/23 1328  PT Visits / Re-Eval  Visit Number 1  Number of Visits 9 (8 + eval)  Date for PT Re-Evaluation 06/28/23 (pushed out due to scheduling)  Authorization  Authorization Type [employer]  PT Time Calculation  PT Start Time 1324 (Pt received on time, but caregiver not with pt until 1324 and pt unable to verbally respond to PT questions)  PT Stop Time 1359  PT Time Calculation (min) 35 min  PT - End of Session  Equipment Utilized During Treatment Gait belt  Behavior During Therapy Agitated (aphasic)    Past Medical History:  Diagnosis Date   Allergy    Anxiety    Cancer of appendix (HCC) Stage 4    Depression    Expressive aphasia    Hemiparesis (HCC)    Right side   High triglycerides    History of kidney stones    Hypertension    Kidney stones    Recurrent sinus infections    Septic shock (HCC) 03/26/2023   Septic shock (HCC) 02/08/2021   Stroke (HCC) 07/09/2019   Past Surgical History:  Procedure Laterality Date   ANTERIOR CRUCIATE LIGAMENT REPAIR Left    BUNIONECTOMY Right    CYSTOSCOPY/URETEROSCOPY/HOLMIUM LASER/STENT PLACEMENT Right 02/08/2021   Procedure: CYSTOSCOPY/RETROGRADE/URETEROSCOPY/HOLMIUM LASER/STENT PLACEMENT, NEPHROSTOMY TUBE REMOVAL;  Surgeon: Rene Paci, MD;  Location: [facility];  Service: Urology;  Laterality: Right;   CYSTOSCOPY/URETEROSCOPY/HOLMIUM LASER/STENT PLACEMENT Bilateral 04/10/2023   Procedure: CYSTOSCOPY, BILATERAL URETEROSCOPY, BILATERAL RETROGRADE PYELOGRAM, HOLMIUM LASER LITHOTRIPSY, AND BILATERAL URETERAL STENT PLACEMENT, removal of percutanous stent post op;  Surgeon: Rene Paci, MD;  Location: [facility];  Service: Urology;  Laterality:  Bilateral;  90 MINUTES   IR NEPHROSTOMY PLACEMENT RIGHT  12/09/2020   IR NEPHROSTOMY PLACEMENT RIGHT  03/27/2023   LASIK  2008   LIPOSUCTION     LITHOTRIPSY     MENISCUS REPAIR Left    NASAL SEPTUM SURGERY     ROBOTIC ASSISTED TOTAL HYSTERECTOMY WITH BILATERAL SALPINGO OOPHERECTOMY Bilateral 12/06/2021   Procedure: XI ROBOTIC ASSISTED BILATERAL SALPINGO OOPHORECTOMY, TOTAL HYSTERECTOMY, PERITONEAL BIOPSY, OMENTECTOMY, LAPAROTOMY, APPENDECTOMY, PAP SMEAR;  Surgeon: Carver Fila, MD;  Location: [facility];  Service: Gynecology;  Laterality: Bilateral;   SHOULDER SURGERY Right    VARICOSE VEIN SURGERY Left    Patient Active Problem List   Diagnosis Date Noted   Sepsis secondary to UTI (HCC) 03/29/2023   Obstruction of right ureteropelvic junction (UPJ) due to stone 03/29/2023   Hydronephrosis 03/29/2023   Bacteremia 03/29/2023   Stroke (cerebrum) (HCC) 03/29/2023   Ovarian mass 12/06/2021   Pelvic mass in female 11/06/2021   Urinary retention    E. 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   Hypernatremia  Leukocytosis    Essential hypertension    Global aphasia    Cytotoxic brain edema (HCC) 07/10/2019   ICH (intracerebral hemorrhage) (HCC) 07/09/2019   Anxiety state 02/21/2015   Cephalalgia 12/21/2014    ONSET DATE: 2020  REFERRING DIAG: W11.91 (ICD-10-CM) - Status post CVA  THERAPY DIAG:  Hemiplegia and hemiparesis following nontraumatic intracerebral hemorrhage  affecting right dominant side (HCC)  Unsteadiness on feet  Abnormal posture  Muscle weakness (generalized)  Other abnormalities of gait and mobility  Rationale for Evaluation and Treatment: Rehabilitation  SUBJECTIVE:                                                                                                                                                                                             SUBJECTIVE STATEMENT: Pt presents ambulating with quad cane with dense right hemiparesis.  Caregiver endorses nothing with pt has changed.  She states pt walks on the treadmill and uses an exercise ball to do hamstring curls, but that is about it for activity. 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 coli UTI with obstructing right renal stone, appendiceal adenocarcinoma stage IV, diagnosed on TAH/BSO in 11/2021  PAIN:  Are you having pain? No  PRECAUTIONS: Fall  RED FLAGS: None   WEIGHT BEARING RESTRICTIONS: No  FALLS: Has patient fallen in last 6 months? No  LIVING ENVIRONMENT: Lives with: lives with their spouse; caregiver (CNA) present everyday 5am until her husband gets home Lives in: House/apartment Stairs: Yes: Internal: 16 steps; can reach both and External: 2 steps; none Has following equipment at home: Quad cane small base, Environmental consultant - 4 wheeled, Wheelchair (manual), Shower bench, and chair lift  PLOF:  Pt and caregiver endorse that she is independent.  Pt screams "NO" at PT when PT asks if she needs assistance with bathing and dressing.  PATIENT GOALS: Caregiver endorses wanting her to work on her R side.  OBJECTIVE:  Note: Objective measures were completed at Evaluation unless otherwise noted.  DIAGNOSTIC FINDINGS:   MRI Brain 07/11/2019: IMPRESSION: 1. Large intra-axial hemorrhage in the left hemisphere centered at the left lentiform nucleus with  estimated 53 mL volume appears not significantly changed in size since 07/09/2019. Superimposed restricted diffusion suggesting this is hemorrhagic transformation of an ischemic infarct. Surrounding edema and regional mass effect including mildly increased rightward midline shift of 6-7 mm. No ventricular trapping.  Basilar cisterns remain patent.   2. Trace intraventricular hemorrhage. No definite extra-axial extension of blood (although see #3).   3. Small indeterminate extra-axial  collection or mass in the left prepontine cistern measuring 15 x 7 x 11 mm. 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 Small volume subdural hematoma less likely. Follow-up post-contrast brain MRI would be necessary to confirm.   4. 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  No recent relevant imaging.  COGNITION: Overall cognitive status: History of cognitive impairments - at baseline - Patient frequently yelling at PT, repeating "No" to most questions whether directed at caregiver or pt directly.  Pt prods caregiver during one attempt to answer PT questions while screaming "No!"     SENSATION: Testing unreliable  COORDINATION: Impaired  EDEMA:  None significant in BLE  MUSCLE TONE: None noted in RLE, pt wearing anterior guard AFO, UE/LE appear flaccid  POSTURE: forward head and weight shift left  LOWER EXTREMITY ROM/MMT:     Active  Right Eval Left Eval  Hip flexion none Grossly 4/5  Hip extension    Hip abduction    Hip adduction    Hip internal rotation    Hip external rotation    Knee flexion    Knee extension none   Ankle dorsiflexion none   Ankle plantarflexion    Ankle inversion    Ankle eversion     (Blank rows = not tested)  BED MOBILITY:  Pt and caregiver report independence w/o rail or other AD  TRANSFERS: Assistive device utilized: Counselling psychologist  Sit to stand: Modified independence Stand to sit: Modified independence Chair to chair:  Modified independence  GAIT: Gait pattern: decreased arm swing- Right, decreased step length- Left, decreased stance time- Right, decreased stride length, decreased hip/knee flexion- Right, decreased ankle dorsiflexion- Right, circumduction- Right, knee flexed in stance- Right, narrow BOS, and poor foot clearance- Right Distance walked: various clinic distances Assistive device utilized: Quad cane small base Level of assistance: Modified independence Comments: Pt does not like being guarded and gestures PT away from her on entrance to clinic shoving water bottle into PT hands.  She has some difficulty understanding visual and verbal cues for path finding.  FUNCTIONAL TESTS:  5 times sit to stand: 33.31 sec w/ LUE support and uncontrolled descent 10 meter walk test: Will reassess, pt completes partially before stopping and repeating "No!" Without PT able to redirect to task or successfully encourage completion so returned to room with caregiver. Berg Balance Scale: To be assessed.  PATIENT SURVEYS:  FOTO Not applicable due to chronicity  TODAY'S TREATMENT:                                                                                                                              DATE: N/A - eval only.   PATIENT EDUCATION: Education details: PT POC, assessments used and to be used, and goals to be set. Person educated: Patient and Caregiver Demetrius Education method: Explanation Education comprehension: verbalized understanding and needs further education  HOME EXERCISE PROGRAM: To be established.  GOALS: Goals reviewed with  patient? Yes  SHORT TERM GOALS: Target date: 05/31/2023  Pt will decrease 5xSTS to <28.31 seconds w/ improved eccentric control in order to demonstrate decreased risk for falls and improved functional bilateral LE strength and power. Baseline:  33.31 sec w/ LUE support and uncontrolled descent Goal status: INITIAL  2.  to be assessed w/ STG/LTG set as  able and appropriate. Baseline:  Attempted testing on eval. Goal status: INITIAL  3.  BERG to be assessed w/ STG/LTG set as able and appropriate. Baseline:  To be assessed. Goal status: INITIAL  4.  Pt will be independent and compliant with initial strength and balance HEP in order to maintain functional progress and improve mobility. Baseline:  To be established. Goal status: INITIAL  LONG TERM GOALS: Target date: 06/28/2023  Pt will decrease 5xSTS to <23.31 seconds w/ improved eccentric control in order to demonstrate decreased risk for falls and improved functional bilateral LE strength and power. Baseline:  33.31 sec w/ LUE support and uncontrolled descent Goal status: INITIAL  2.  to be assessed w/ STG/LTG set as able and appropriate. Baseline:  Attempted testing on eval. Goal status: INITIAL  3.  BERG to be assessed w/ STG/LTG set as able and appropriate. Baseline:  To be assessed. Goal status: INITIAL  4.  Pt will be independent and compliant with finalized strength and balance HEP in order to maintain functional progress and improve mobility. Baseline:  To be established. Goal status: INITIAL  5.  Patient will be compliant to formal walking program >/= 3 days per week with supervision for safety to improve aerobic tolerance and ambulatory mechanics. Baseline:  To be established. Goal status: INITIAL  ASSESSMENT:  CLINICAL IMPRESSION: Patient is a 57 y.o. female who was seen today for physical therapy evaluation and treatment for chronic right hemiparesis.  Pt has a significant PMH of hemorrhagic CVA in 2020, septic shock in 2022 due to E. coli UTI with obstructing right renal stone, appendiceal adenocarcinoma stage IV.  Identified impairments include aphasia, agitation, impaired coordination, right flaccidity requiring anterior guard AFO, right circumduction with decreased stance and arm swing on same side, and left weight shift and forward head posture in sitting.   Evaluation via the following assessment tools: 5xSTS indicates elevated fall risk especially considering poor eccentric control.  and BERG to be assessed to further measure and intervene fall risk and chronic deficits.  She would benefit from skilled PT to address impairments as noted and progress towards long term goals.  OBJECTIVE IMPAIRMENTS: Abnormal gait, decreased activity tolerance, decreased balance, decreased cognition, decreased coordination, decreased endurance, decreased knowledge of condition, decreased knowledge of use of DME, decreased mobility, difficulty walking, decreased ROM, decreased strength, decreased safety awareness, impaired perceived functional ability, impaired tone, impaired UE functional use, improper body mechanics, and postural dysfunction.   ACTIVITY LIMITATIONS: carrying, lifting, bending, sitting, standing, squatting, stairs, transfers, and locomotion level  PARTICIPATION LIMITATIONS: meal prep, cleaning, laundry, interpersonal relationship, driving, shopping, community activity, and occupation  PERSONAL FACTORS: Age, Behavior pattern, Fitness, Past/current experiences, Time since onset of injury/illness/exacerbation, and 1-2 comorbidities: hemorrhagic CVA  are also affecting patient's functional outcome.   REHAB POTENTIAL: Poor see PMH and personal factors  CLINICAL DECISION MAKING: Evolving/moderate complexity  EVALUATION COMPLEXITY: Moderate  PLAN:  PT FREQUENCY: 1x/week  PT DURATION: 8 weeks  PLANNED INTERVENTIONS: Therapeutic exercises, Therapeutic activity, Neuromuscular re-education, Balance training, Gait training, Patient/Family education, Self Care, Stair training, Vestibular training, Visual/preceptual remediation/compensation, Orthotic/Fit training, DME instructions, Electrical stimulation, Manual  therapy, and Re-evaluation  PLAN FOR NEXT SESSION:  Assess and BERG - set STG/LTG.  Establish HEP for R hemibody weakness and static  balance.  Gait training.  R NMR.  Walking program?  Endurance.  Sadie Haber, PT, DPT 04/29/2023, 1:59 PM

## 2023-05-06 ENCOUNTER — Ambulatory Visit: Payer: Managed Care, Other (non HMO) | Attending: Internal Medicine | Admitting: Physical Therapy

## 2023-05-06 VITALS — BP 154/88 | HR 85

## 2023-05-06 DIAGNOSIS — R278 Other lack of coordination: Secondary | ICD-10-CM | POA: Insufficient documentation

## 2023-05-06 DIAGNOSIS — I69251 Hemiplegia and hemiparesis following other nontraumatic intracranial hemorrhage affecting right dominant side: Secondary | ICD-10-CM | POA: Insufficient documentation

## 2023-05-06 DIAGNOSIS — R2681 Unsteadiness on feet: Secondary | ICD-10-CM | POA: Insufficient documentation

## 2023-05-06 DIAGNOSIS — I69151 Hemiplegia and hemiparesis following nontraumatic intracerebral hemorrhage affecting right dominant side: Secondary | ICD-10-CM | POA: Diagnosis present

## 2023-05-06 DIAGNOSIS — M6281 Muscle weakness (generalized): Secondary | ICD-10-CM | POA: Insufficient documentation

## 2023-05-06 DIAGNOSIS — S43001D Unspecified subluxation of right shoulder joint, subsequent encounter: Secondary | ICD-10-CM | POA: Insufficient documentation

## 2023-05-06 DIAGNOSIS — R293 Abnormal posture: Secondary | ICD-10-CM | POA: Insufficient documentation

## 2023-05-06 DIAGNOSIS — R2689 Other abnormalities of gait and mobility: Secondary | ICD-10-CM | POA: Insufficient documentation

## 2023-05-06 NOTE — Therapy (Signed)
OUTPATIENT PHYSICAL THERAPY NEURO TREATMENT   Patient Name: Melanie Madden MRN: [id number] DOB:January 04, 1966, 57 y.o., female Today's Date: 05/06/2023   PCP: Sigmund Hazel, MD REFERRING PROVIDER: Cathren Harsh, MD  END OF SESSION:  PT End of Session - 05/06/23 1414     Visit Number 2    Number of Visits 9   8 + eval   Date for PT Re-Evaluation 06/28/23   pushed out due to scheduling   Authorization Type [employer]    PT Start Time 1400    PT Stop Time 1448    PT Time Calculation (min) 48 min    Equipment Utilized During Treatment --    Activity Tolerance Patient tolerated treatment well    Behavior During Therapy Agitated   aphasic            Past Medical History:  Diagnosis Date   Allergy    Anxiety    Cancer of appendix (HCC) Stage 4    Depression    Expressive aphasia    Hemiparesis (HCC)    Right side   High triglycerides    History of kidney stones    Hypertension    Kidney stones    Recurrent sinus infections    Septic shock (HCC) 03/26/2023   Septic shock (HCC) 02/08/2021   Stroke (HCC) 07/09/2019   Past Surgical History:  Procedure Laterality Date   ANTERIOR CRUCIATE LIGAMENT REPAIR Left    BUNIONECTOMY Right    CYSTOSCOPY/URETEROSCOPY/HOLMIUM LASER/STENT PLACEMENT Right 02/08/2021   Procedure: CYSTOSCOPY/RETROGRADE/URETEROSCOPY/HOLMIUM LASER/STENT PLACEMENT, NEPHROSTOMY TUBE REMOVAL;  Surgeon: Rene Paci, MD;  Location: [facility];  Service: Urology;  Laterality: Right;   CYSTOSCOPY/URETEROSCOPY/HOLMIUM LASER/STENT PLACEMENT Bilateral 04/10/2023   Procedure: CYSTOSCOPY, BILATERAL URETEROSCOPY, BILATERAL RETROGRADE PYELOGRAM, HOLMIUM LASER LITHOTRIPSY, AND BILATERAL URETERAL STENT PLACEMENT, removal of percutanous stent post op;  Surgeon: Rene Paci, MD;  Location: [facility];  Service: Urology;  Laterality: Bilateral;  90 MINUTES   IR NEPHROSTOMY PLACEMENT RIGHT  12/09/2020   IR NEPHROSTOMY PLACEMENT  RIGHT  03/27/2023   LASIK  2008   LIPOSUCTION     LITHOTRIPSY     MENISCUS REPAIR Left    NASAL SEPTUM SURGERY     ROBOTIC ASSISTED TOTAL HYSTERECTOMY WITH BILATERAL SALPINGO OOPHERECTOMY Bilateral 12/06/2021   Procedure: XI ROBOTIC ASSISTED BILATERAL SALPINGO OOPHORECTOMY, TOTAL HYSTERECTOMY, PERITONEAL BIOPSY, OMENTECTOMY, LAPAROTOMY, APPENDECTOMY, PAP SMEAR;  Surgeon: Carver Fila, MD;  Location: [facility];  Service: Gynecology;  Laterality: Bilateral;   SHOULDER SURGERY Right    VARICOSE VEIN SURGERY Left    Patient Active Problem List   Diagnosis Date Noted   Sepsis secondary to UTI (HCC) 03/29/2023   Obstruction of right ureteropelvic junction (UPJ) due to stone 03/29/2023   Hydronephrosis 03/29/2023   Bacteremia 03/29/2023   Stroke (cerebrum) (HCC) 03/29/2023   Ovarian mass 12/06/2021   Pelvic mass in female 11/06/2021   Urinary retention    E. 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   Hypernatremia    Leukocytosis    Essential hypertension    Global aphasia    Cytotoxic brain edema (HCC) 07/10/2019   ICH (intracerebral hemorrhage) (HCC) 07/09/2019   Anxiety state 02/21/2015   Cephalalgia 12/21/2014    ONSET DATE: 2020  REFERRING DIAG: W29.56 (ICD-10-CM) - Status post CVA  THERAPY DIAG:  Hemiplegia and hemiparesis following nontraumatic intracerebral hemorrhage affecting right dominant side (HCC)  Unsteadiness on feet  Muscle weakness (generalized)  Rationale  for Evaluation and Treatment: Rehabilitation  SUBJECTIVE:                                                                                                                                                                                             SUBJECTIVE STATEMENT: Pt presents w/husband, Aurther Loft, in waiting room. Initially refuses to allow Aurther Loft to come into session. During subjective, pt requesting Aurther Loft be brought into session to assist w/therapist's questions. Pt and husband report that pt frequently has pain in abdomen due to history of cancer and recent ureter blockage. Pt denies pain in RUE and RLE. Pt is receiving SLP at [facility] currently and was able to write to communicate w/therapist at times. No falls.   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 coli UTI with obstructing right renal stone, appendiceal adenocarcinoma stage IV, diagnosed on TAH/BSO in 11/2021  PAIN:  Are you having pain? No  PRECAUTIONS: Fall  RED FLAGS: None   WEIGHT BEARING RESTRICTIONS: No  FALLS: Has patient fallen in last 6 months? No  LIVING ENVIRONMENT: Lives with: lives with their spouse; caregiver (CNA) present everyday 5am until her husband gets home Lives in: House/apartment Stairs: Yes: Internal: 16 steps; can reach both and External: 2 steps; none Has following equipment at home: Quad cane small base, Environmental consultant - 4 wheeled, Wheelchair (manual), Shower bench, and chair lift  PLOF:  Pt and caregiver endorse that she is independent.  Pt screams "NO" at PT when PT asks if she needs assistance with bathing and dressing.  PATIENT GOALS: Caregiver endorses wanting her to work on her R side.  OBJECTIVE:  Note: Objective measures were completed at Evaluation unless otherwise noted.  DIAGNOSTIC FINDINGS:   MRI Brain 07/11/2019: IMPRESSION: 1. Large intra-axial hemorrhage in the left  hemisphere centered at the left lentiform nucleus with estimated 53 mL volume appears not significantly changed in size since 07/09/2019. Superimposed restricted diffusion suggesting this is hemorrhagic transformation of an ischemic infarct. Surrounding edema and regional mass effect including mildly  increased rightward midline shift of 6-7 mm. No ventricular trapping.  Basilar cisterns remain patent.   2. Trace intraventricular hemorrhage. No definite extra-axial extension of blood (although see #3).   3. Small indeterminate extra-axial collection or mass in the left prepontine cistern measuring 15 x 7 x 11 mm. 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 Small volume subdural hematoma less likely. Follow-up post-contrast brain MRI would be necessary to confirm.   4. 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  No recent relevant imaging.  COGNITION: Overall cognitive status: History of cognitive impairments - at baseline - Patient frequently yelling at PT, repeating "No" to most questions whether directed at caregiver or pt directly.  Pt prods caregiver during one attempt to answer PT questions while screaming "No!"     SENSATION: Testing unreliable  COORDINATION: Impaired  EDEMA:  None significant in BLE  MUSCLE TONE: None noted in RLE, pt wearing anterior guard AFO, UE/LE appear flaccid  POSTURE: forward head and weight shift left  LOWER EXTREMITY ROM/MMT:     Active  Right Eval Left Eval  Hip flexion none Grossly 4/5  Hip extension    Hip abduction    Hip adduction    Hip internal rotation    Hip external rotation    Knee flexion    Knee extension none   Ankle dorsiflexion none   Ankle plantarflexion    Ankle inversion    Ankle eversion     (Blank rows = not tested)  BED MOBILITY:  Pt and caregiver report independence w/o rail or other AD  TRANSFERS: Assistive device utilized: Counselling psychologist  Sit to stand: Modified  independence Stand to sit: Modified independence Chair to chair: Modified independence  GAIT: Gait pattern: decreased arm swing- Right, decreased step length- Left, decreased stance time- Right, decreased stride length, decreased hip/knee flexion- Right, decreased ankle dorsiflexion- Right, circumduction- Right, knee flexed in stance- Right, narrow BOS, and poor foot clearance- Right Distance walked: various clinic distances Assistive device utilized: Quad cane small base Level of assistance: Modified independence Comments: Pt wearing SpryStep max AFO on RLE. No instability noted.  FUNCTIONAL TESTS:  5 times sit to stand: 33.31 sec w/ LUE support and uncontrolled descent 10 meter walk test: Will reassess, pt completes partially before stopping and repeating "No!" Without PT able to redirect to task or successfully encourage completion so returned to room with caregiver. Berg Balance Scale: 23/56  VITALS  Vitals:   05/06/23 1421 05/06/23 1427  BP: (!) 167/107 (!) 154/88  Pulse: 87 85     TODAY'S TREATMENT:                                                                                                                               Ther Act  Assessed vitals (see above) and diastolic initially elevated but did reduce to normal limits after several minutes of seated rest   [facility] PT Assessment - 05/06/23  1416       Balance   Balance Assessed Yes      Standardized Balance Assessment   Standardized Balance Assessment Berg Balance Test      Berg Balance Test   Sit to Stand Able to stand  independently using hands    Standing Unsupported Able to stand 2 minutes with supervision    Sitting with Back Unsupported but Feet Supported on Floor or Stool Able to sit safely and securely 2 minutes    Stand to Sit Controls descent by using hands    Transfers Able to transfer safely, definite need of hands    Standing Unsupported with Eyes Closed Able to stand 10 seconds with supervision     Standing Unsupported with Feet Together Needs help to attain position but able to stand for 30 seconds with feet together    From Standing, Reach Forward with Outstretched Arm Loses balance while trying/requires external support    From Standing Position, Pick up Object from Floor Unable to try/needs assist to keep balance    From Standing Position, Turn to Look Behind Over each Shoulder Turn sideways only but maintains balance    Turn 360 Degrees Needs assistance while turning   Refused   Standing Unsupported, Alternately Place Feet on Step/Stool Needs assistance to keep from falling or unable to try    Standing Unsupported, One Foot in Front Needs help to step but can hold 15 seconds    Standing on One Leg Unable to try or needs assist to prevent fall    Total Score 23    Berg comment: High fall risk, pt largely refused last half of test due to fear of falling            Husband inquiring about an ankle brace for pt to wear at night to prevent pt from rolling her ankle (husband reports pt has excessive inversion, pt yells "NO", points to her foot and demonstrates eversion). Showed pt and husband an aircast that could be used to provide lateral ankle stability but informed them that it will not assist w/foot drop. Pt currently wearing a foot-up brace that husband purchased from [employer] and does not provide good stability when pt gets up to use restroom at night.  Educated pt on Bioness L300 and its use in stroke rehab. Pt very attentive to therapist but refuses to wear shorts and then repeated "no" when asked if interested in using it. Husband reports pt frequently says "no" when she means yes and vice versa, so unclear if pt is willing to use at this time.  Verbally initiated HEP to work on proper sit to stands, as pt currently bracing against chair w/significant rotation to L side to perform. Assisted pt by properly positioning RLE and encouraging her to lean forward rather than to L side and pt  able to perform w/significant instability noted of her R knee. Encouraged pt to practice this at home w/husband/caregiver.    PATIENT EDUCATION: Education details: Scientist, research (medical), aircast, initial HEP   Person educated: Patient and Spouse  Education method: Explanation Education comprehension: verbalized understanding and needs further education  HOME EXERCISE PROGRAM: Verbally added proper sit to stands on 10/7 To be reviewed from previous POC: [id number]  GOALS: Goals reviewed with patient? Yes  SHORT TERM GOALS: Target date: 05/31/2023  Pt will decrease 5xSTS to <28.31 seconds w/ improved eccentric control in order to demonstrate decreased risk for falls and improved functional bilateral LE strength and power. Baseline:  33.31 sec w/ LUE support and uncontrolled descent Goal status: INITIAL  2.  to be assessed w/ STG/LTG set as able and appropriate. Baseline:  Attempted testing on eval. Goal status: INITIAL  3.  Pt will improve Berg score to 26/56 for decreased fall risk  Baseline:  23/56 Goal status: REVISED  4.  Pt will be independent and compliant with initial strength and balance HEP in order to maintain functional progress and improve mobility. Baseline:  To be established. Goal status: INITIAL  LONG TERM GOALS: Target date: 06/28/2023  Pt will decrease 5xSTS to <23.31 seconds w/ improved eccentric control in order to demonstrate decreased risk for falls and improved functional bilateral LE strength and power. Baseline:  33.31 sec w/ LUE support and uncontrolled descent Goal status: INITIAL  2.  to be assessed w/ STG/LTG set as able and appropriate. Baseline:  Attempted testing on eval. Goal status: INITIAL  3.  Pt will improve Berg score to 30/56 for decreased fall risk  Baseline:  23/56 Goal status: REVISED   4.  Pt will be independent and compliant with finalized strength and balance HEP in order to maintain functional progress and improve  mobility. Baseline:  To be established. Goal status: INITIAL  5.  Patient will be compliant to formal walking program >/= 3 days per week with supervision for safety to improve aerobic tolerance and ambulatory mechanics. Baseline:  To be established. Goal status: INITIAL  ASSESSMENT:  CLINICAL IMPRESSION: Emphasis of skilled PT session on balance assessment, pt and spouse education and establishing initial HEP. Pt able to perform gestures and verbalize single words to communicate w/therapist during session, but remainder of communication was provided by pt's husband, Aurther Loft, w/pt permission. Pt scored a 23/56 on Berg, indicative of high fall risk. Pt largely refused to perform last half of assessment due to fear of falling. Noted significant quad weakness of RLE, which is mostly controlled by pt's anterior guard AFO. Pt frequently gesturing to her R side to therapist, from her frontal bone to her foot, when asked what her goals are. Verbally initiated HEP this date and will provide handout next session. Continue POC.   OBJECTIVE IMPAIRMENTS: Abnormal gait, decreased activity tolerance, decreased balance, decreased cognition, decreased coordination, decreased endurance, decreased knowledge of condition, decreased knowledge of use of DME, decreased mobility, difficulty walking, decreased ROM, decreased strength, decreased safety awareness, impaired perceived functional ability, impaired tone, impaired UE functional use, improper body mechanics, and postural dysfunction.   ACTIVITY LIMITATIONS: carrying, lifting, bending, sitting, standing, squatting, stairs, transfers, and locomotion level  PARTICIPATION LIMITATIONS: meal prep, cleaning, laundry, interpersonal relationship, driving, shopping, community activity, and occupation  PERSONAL FACTORS: Age, Behavior pattern, Fitness, Past/current experiences, Time since onset of injury/illness/exacerbation, and 1-2 comorbidities: hemorrhagic CVA  are also  affecting patient's functional outcome.   REHAB POTENTIAL: Poor see PMH and personal factors  CLINICAL DECISION MAKING: Evolving/moderate complexity  EVALUATION COMPLEXITY: Moderate  PLAN:  PT FREQUENCY: 1x/week  PT DURATION: 8 weeks  PLANNED INTERVENTIONS: Therapeutic exercises, Therapeutic activity, Neuromuscular re-education, Balance training, Gait training, Patient/Family education, Self Care, Stair training, Vestibular training, Visual/preceptual remediation/compensation, Orthotic/Fit training, DME instructions, Electrical stimulation, Manual therapy, and Re-evaluation  PLAN FOR NEXT SESSION:  Assess - set STG/LTG.  Add to and review old HEP for R hemibody weakness and static balance.  Gait training.  R NMR.  Walking program?  Endurance. Quad strength   Jill Alexanders Ziv Welchel, PT, DPT 05/06/2023, 8:17 PM

## 2023-05-12 IMAGING — CT CT RENAL STONE PROTOCOL
2 of 4 series · 15 of 46 positions shown, 17 images · non-contrast
Comparison: None.

CLINICAL DATA: Sepsis.  History of obstructive renal calculi

EXAM:
CT ABDOMEN AND PELVIS WITHOUT CONTRAST
TECHNIQUE: Multidetector CT imaging of the abdomen and pelvis was performed
following the standard protocol without oral or IV contrast.

[Series 3: stone study 5.0 i30f 2 · axial · 0.83mm/px · z∈[-134,+321]mm · 12 of 101 slices shown, 14 images]
[im 5/101  soft-tissue]
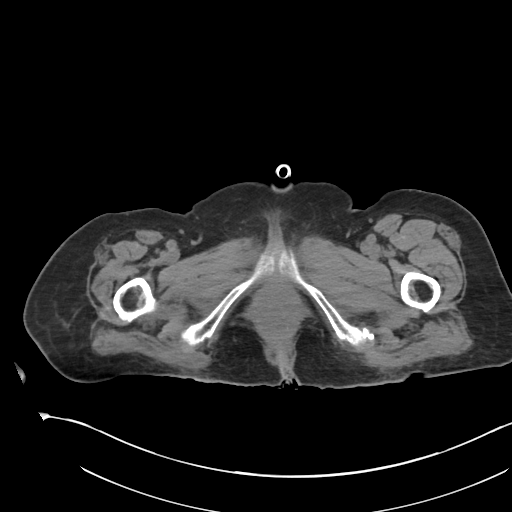
[im 5/101  bone]
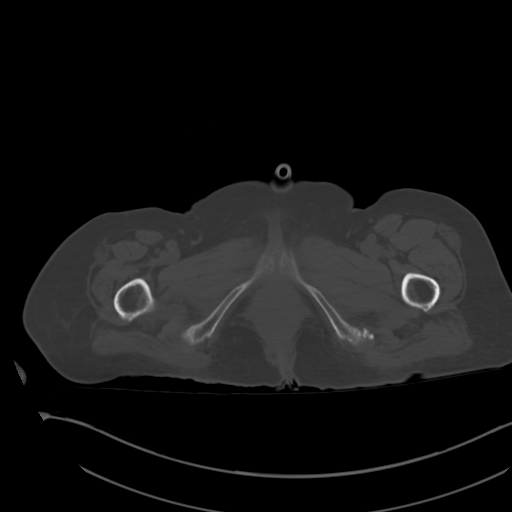
[im 14/101  soft-tissue]
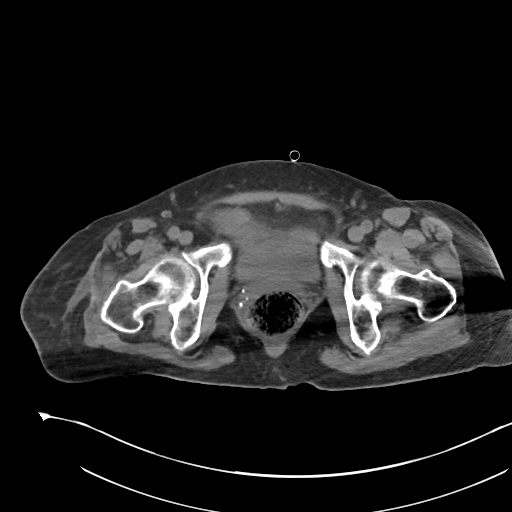
[im 22/101  soft-tissue]
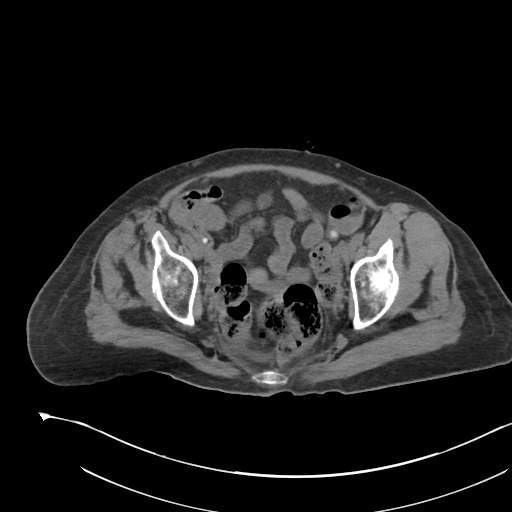
[im 31/101  soft-tissue]
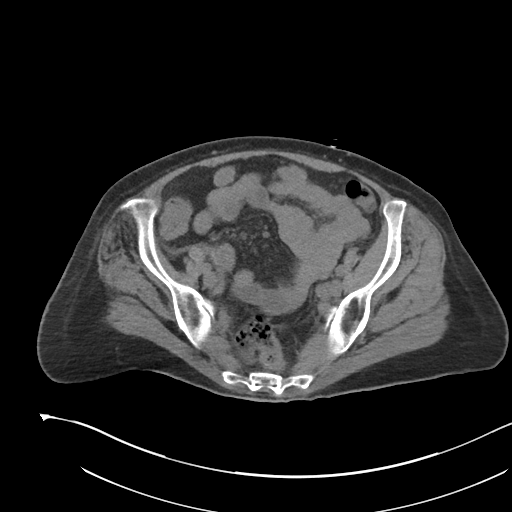
[im 40/101  soft-tissue]
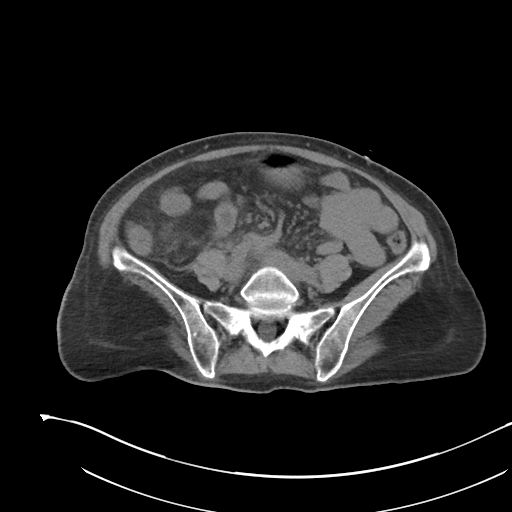
[im 48/101  soft-tissue]
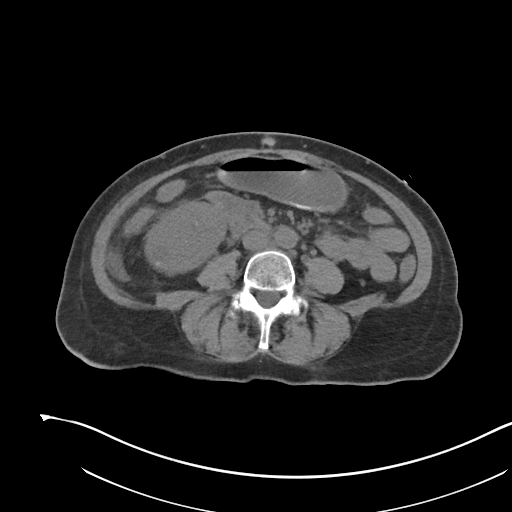
[im 53/101  soft-tissue]
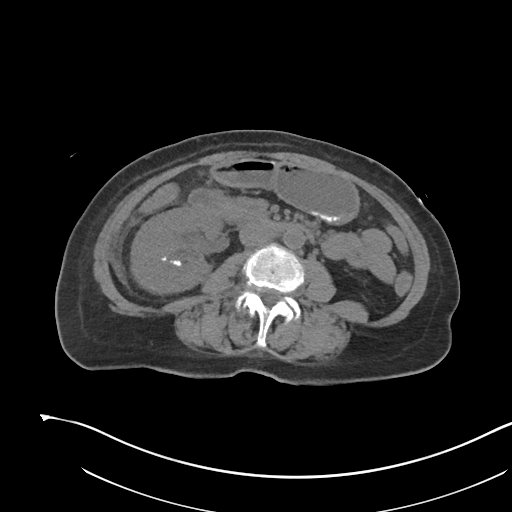
[im 61/101  soft-tissue]
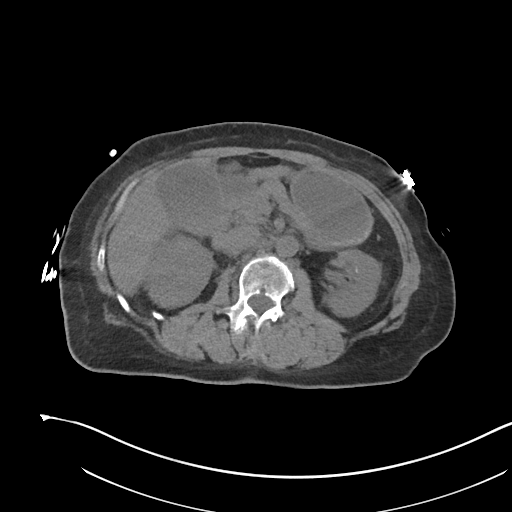
[im 70/101  soft-tissue]
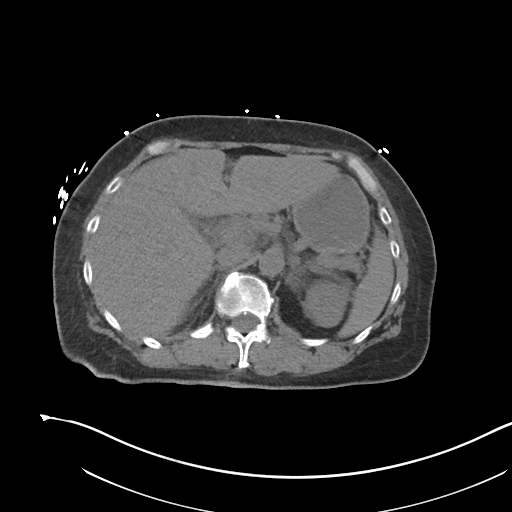
[im 70/101  bone]
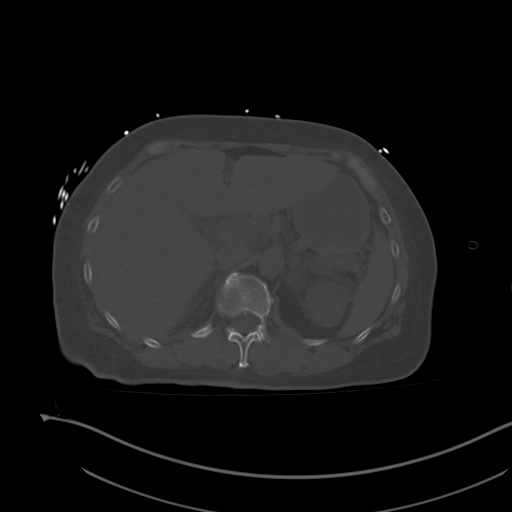
[im 79/101  soft-tissue]
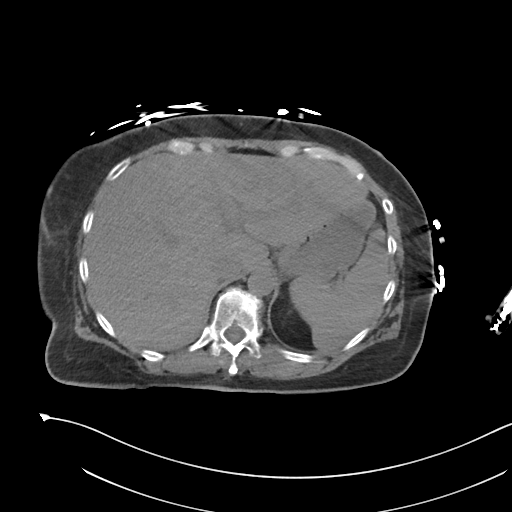
[im 87/101  soft-tissue]
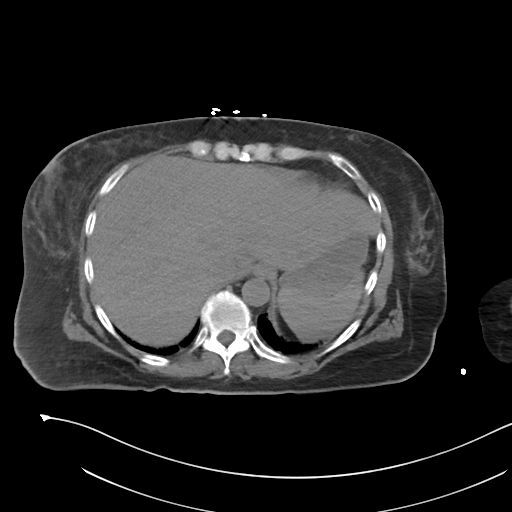
[im 96/101  soft-tissue]
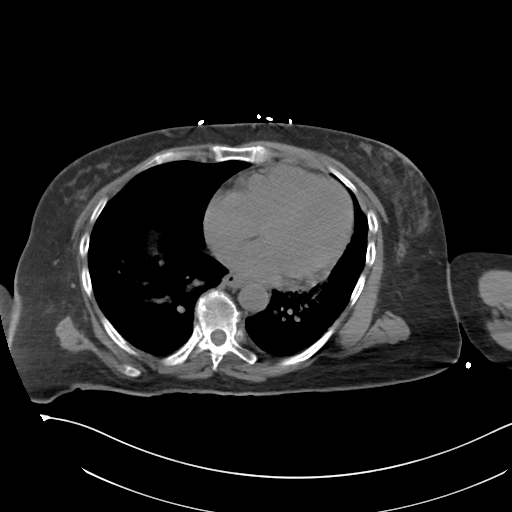

[Series 6: coronal soft tissue · coronal · 0.85mm/px · 3 of 90 slices shown]
[im 30/90  soft-tissue]
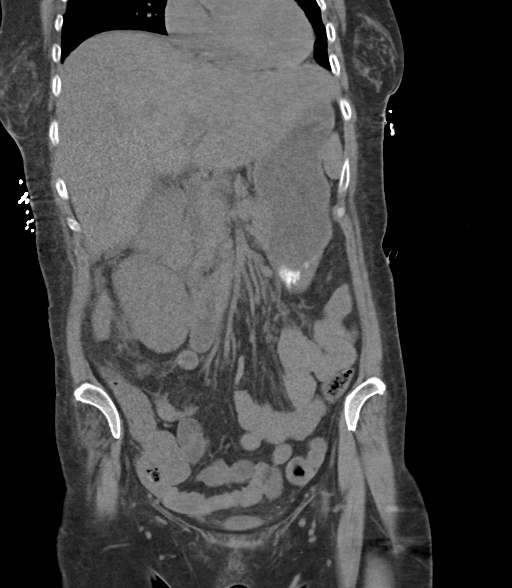
[im 40/90  soft-tissue]
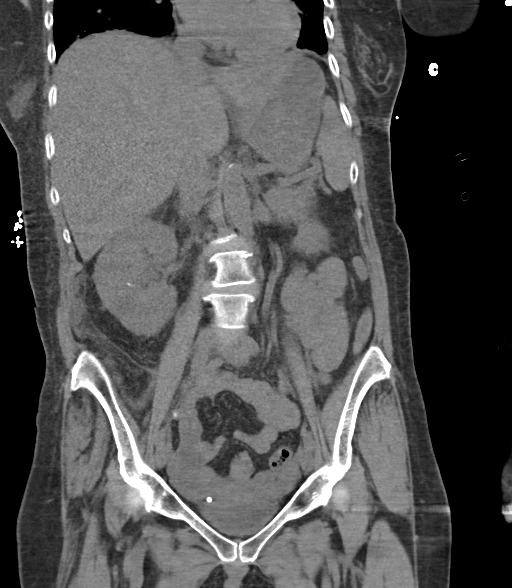
[im 50/90  soft-tissue]
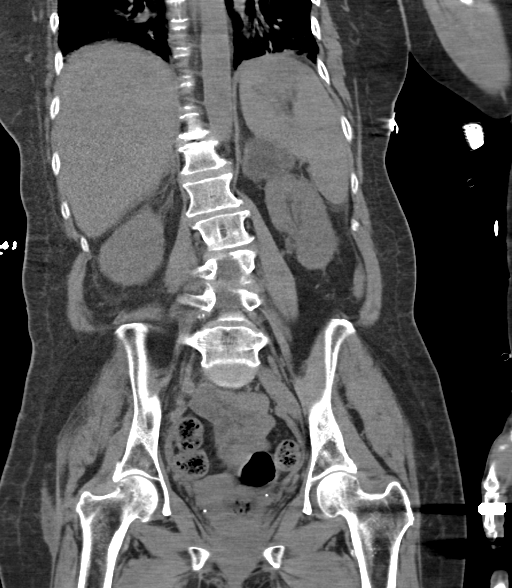

[15 of 46 positions shown; findings below may reference images not displayed]

FINDINGS: Lower chest: There is ill-defined patchy opacity in the lung bases
primarily due to atelectasis.

Hepatobiliary: No focal liver lesions are appreciable on this
noncontrast enhanced study. The gallbladder is mildly distended with
apparent sludge within the gallbladder. No appreciable biliary duct
dilatation.

Pancreas: There is no evident pancreatic mass or inflammatory focus.

Spleen: Probable small granuloma in the spleen. Spleen otherwise
appears unremarkable.

Adrenals/Urinary Tract: Right adrenal appears normal. There is a
left adrenal mass measuring 4.4 x 3.5 cm. Question left adrenal cyst
versus adenoma. The attenuation of this mass is in the cystic range.

The right kidney is edematous. There are small areas of air within
the right kidney, consistent with emphysematous pyelitis. There is
mild hydronephrosis on the right. There is no appreciable
hydronephrosis on the left. No air seen within the left
kidney/collecting system. No renal masses. There are scattered 1-2
mm calculi throughout the left kidney. There is a 4 x 2 mm calculus
in the lower pole left kidney. There are scattered 1-2 mm calculi
throughout the right kidney. There are two adjacent 3 mm calculi in
the mid to lower right kidney with nearby 1-2 mm calculi. There is a
calculus in the mid to lower right kidney occupying a calyx
measuring 1.8 x 0.8 cm. There is a calculus with immediately
adjacent air at the right ureteropelvic junction measuring 8 x 6 mm.
No other ureteral calculi are evident. Urinary bladder is midline
with wall thickness overall increased.

Stomach/Bowel: Stomach filled with fluid. Gastric wall not
thickened. No small or large bowel wall thickening evident. No
appreciable bowel obstruction. Terminal ileum appears unremarkable.
There are several calcifications within the appendix. Appendix is
normal in size and contour without appendiceal wall thickening.
There is no pneumoperitoneum or portal venous air.

Vascular/Lymphatic: Foci of aortic atherosclerosis. No aneurysm of
the aorta evident. No adenopathy in the abdomen or pelvis.

Reproductive: Uterus anteverted. No uterine or adnexal masses are
evident.

Other: There is a small amount of ascites in the dependent portion
of the pelvis. No abscess in the abdomen or pelvis.

Musculoskeletal: Degenerative change in the lower thoracic and
lumbar regions. There is lumbar levoscoliosis. No blastic or lytic
bone lesions. No intramuscular or abdominal wall lesions.
IMPRESSION: 1. Foci of air within right renal collecting system as well as at
the right ureteropelvic junction consistent with a degree of
emphysematous pyelitis. Mild hydronephrosis on the right. There is
an 8 x 6 calculus with immediately adjacent air at the right
ureteropelvic junction. No other ureteral calculi. Note that right
kidney appears edematous.

2. Multiple calculi in each kidney, nonobstructing as noted above.
Largest of these calculi measures 1.8 x 0.8 cm in a lower pole calyx
on the right.

3. No appreciable bowel wall or mesenteric thickening. No bowel
obstruction. No abscess in the abdomen or pelvis. Appendix normal in
size and contour without inflammation. There are calcifications
within the appendix.

4.  Small amount of ascites in dependent portion pelvis.

5. Gallbladder is rather distended and contains sludge. This finding
may warrant gallbladder ultrasound to further evaluate.

6.  Cyst versus adenoma left adrenal measuring 4.4 x 3.5 cm.

7. Probable atelectasis in the lung bases. Mild superimposed
pneumonia in the bases is difficult to exclude in this circumstance.

8.  Aortic Atherosclerosis ([id number]).

## 2023-05-13 ENCOUNTER — Ambulatory Visit: Payer: Managed Care, Other (non HMO)

## 2023-05-13 VITALS — BP 177/109 | HR 128

## 2023-05-13 DIAGNOSIS — R2681 Unsteadiness on feet: Secondary | ICD-10-CM

## 2023-05-13 DIAGNOSIS — M6281 Muscle weakness (generalized): Secondary | ICD-10-CM

## 2023-05-13 DIAGNOSIS — R293 Abnormal posture: Secondary | ICD-10-CM

## 2023-05-13 DIAGNOSIS — R2689 Other abnormalities of gait and mobility: Secondary | ICD-10-CM

## 2023-05-13 DIAGNOSIS — I69151 Hemiplegia and hemiparesis following nontraumatic intracerebral hemorrhage affecting right dominant side: Secondary | ICD-10-CM | POA: Diagnosis not present

## 2023-05-13 IMAGING — US US ABDOMEN LIMITED RUQ/ASCITES
1 series · 14 of 25 positions shown · non-contrast
Comparison: CT abdomen pelvis 12/09/2020

CLINICAL DATA: Sludge in the gallbladder

EXAM:
ULTRASOUND ABDOMEN LIMITED RIGHT UPPER QUADRANT

[Series 1: us abdomen limited ruq (liver/gb) · 14 of 64 slices shown]
[im 1/64]
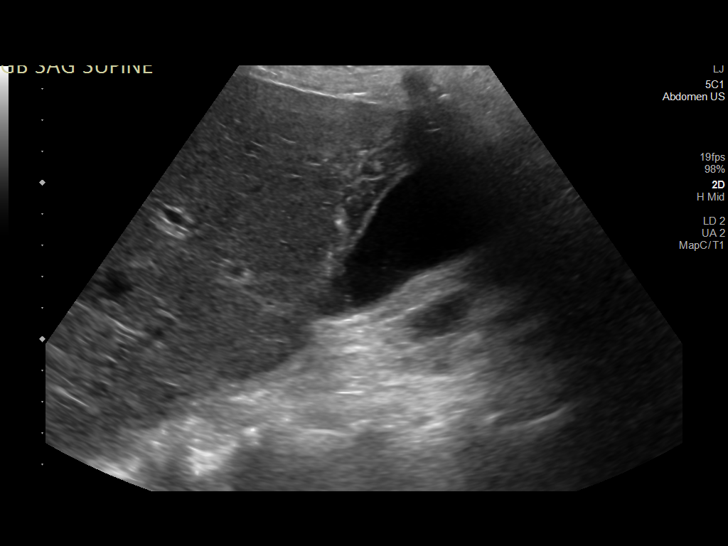
[im 6/64]
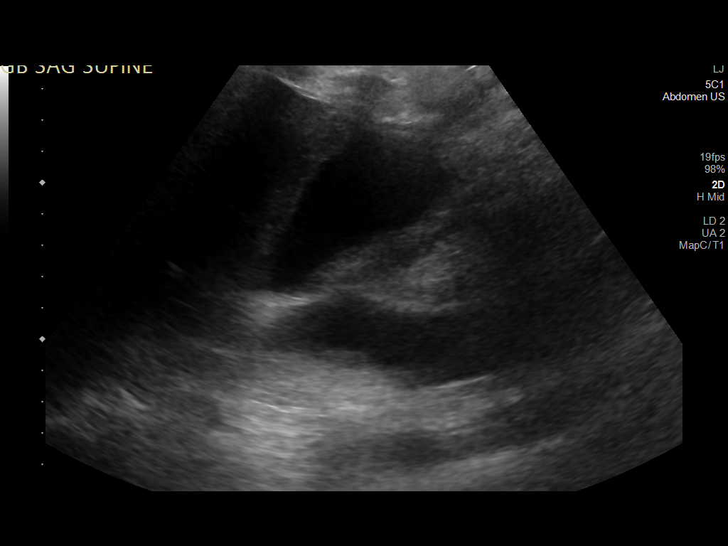
[im 11/64]
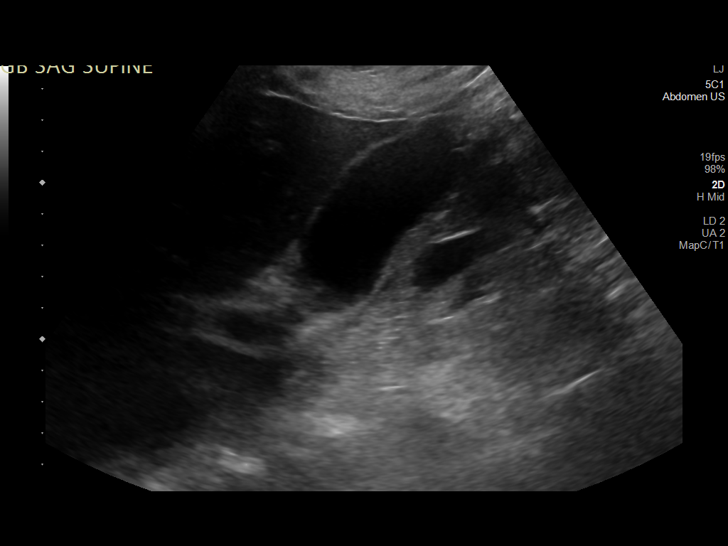
[im 16/64]
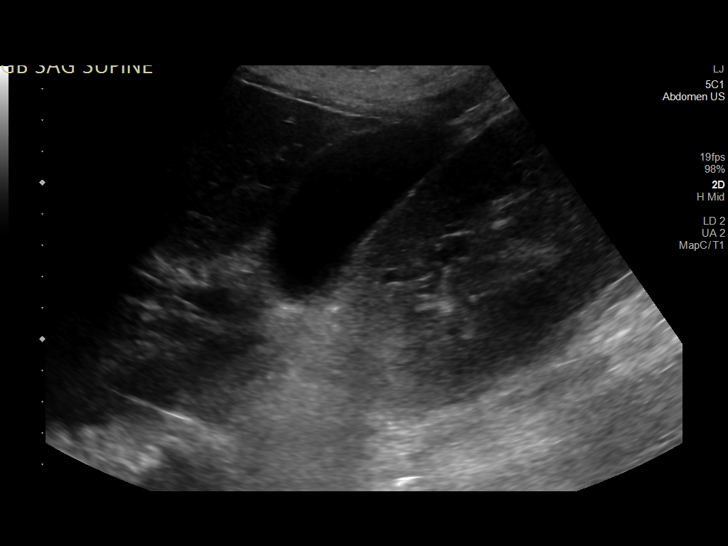
[im 22/64]
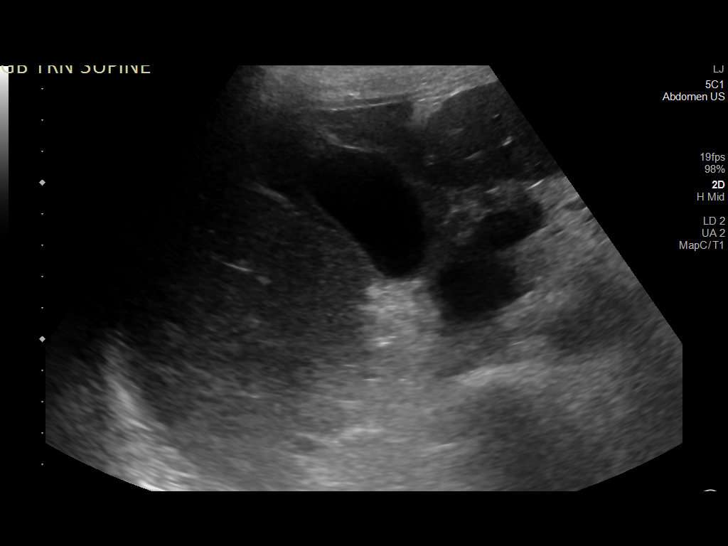
[im 24/64]
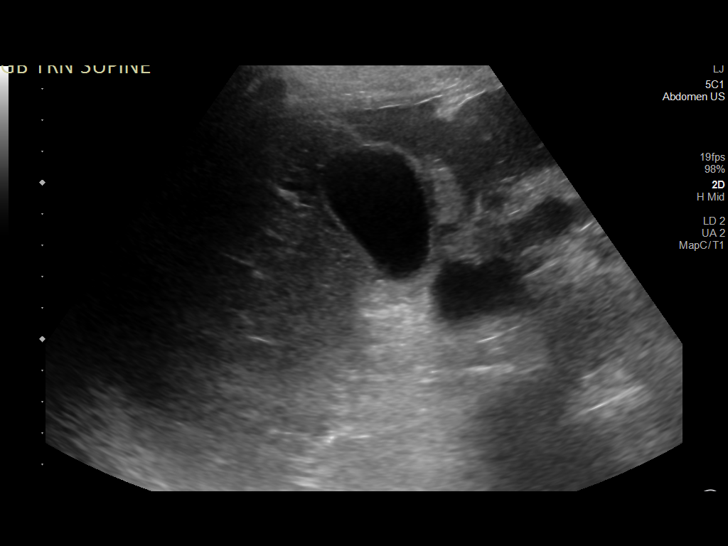
[im 29/64]
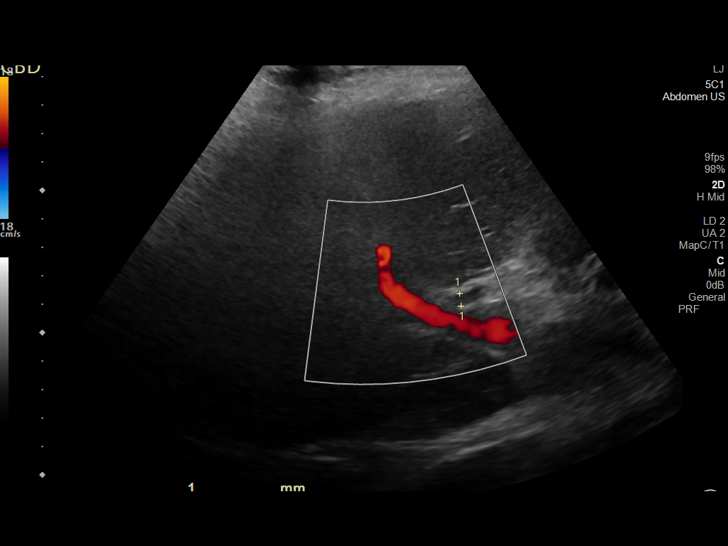
[im 35/64]
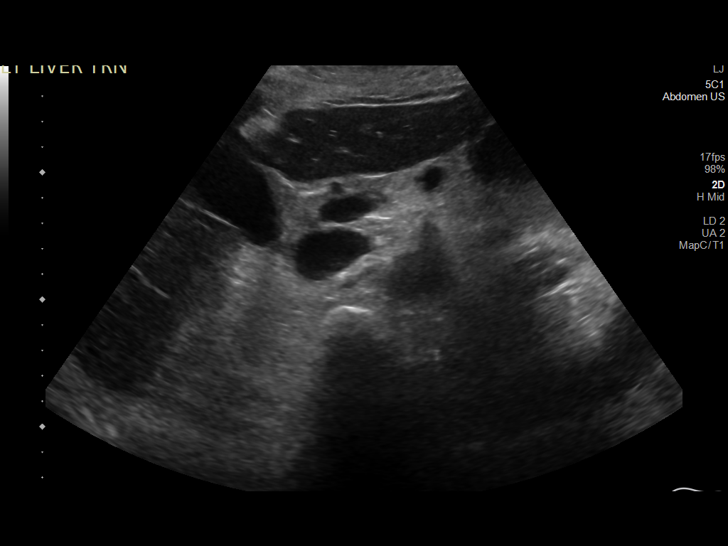
[im 40/64]
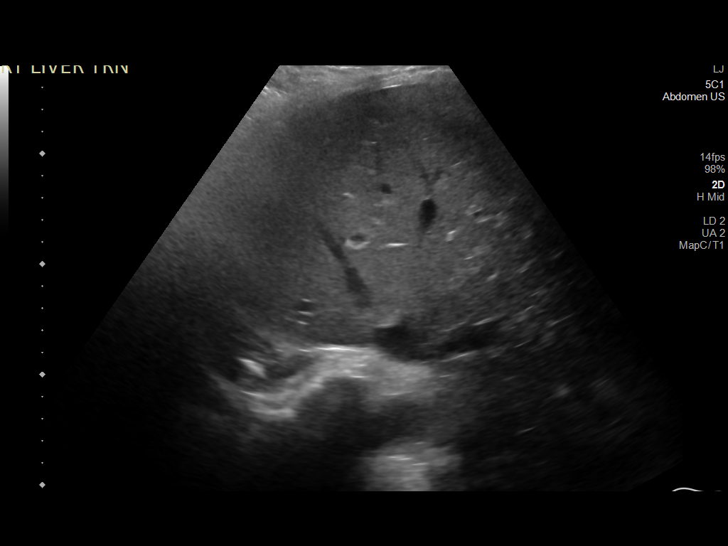
[im 43/64]
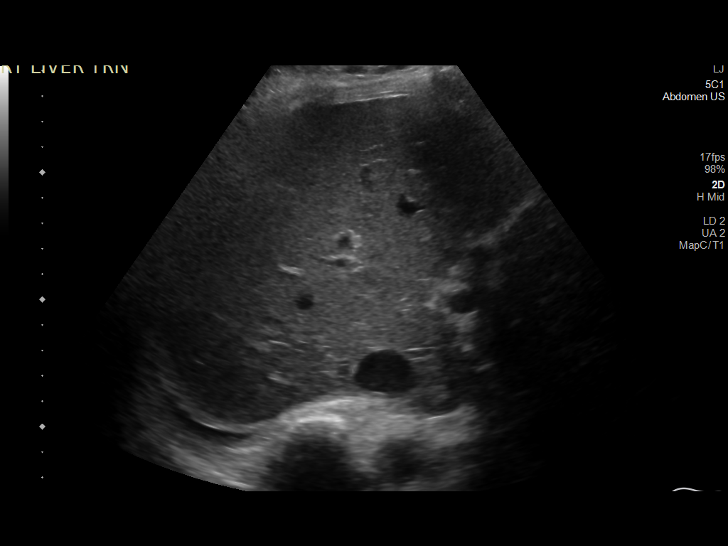
[im 48/64]
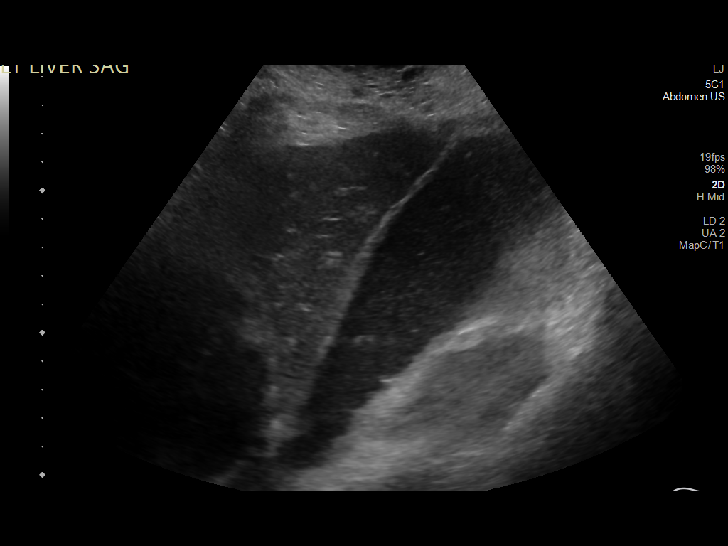
[im 53/64]
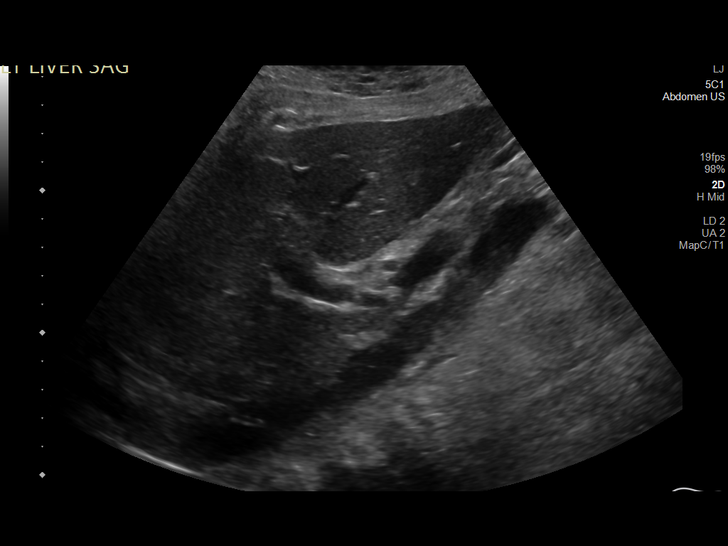
[im 58/64]
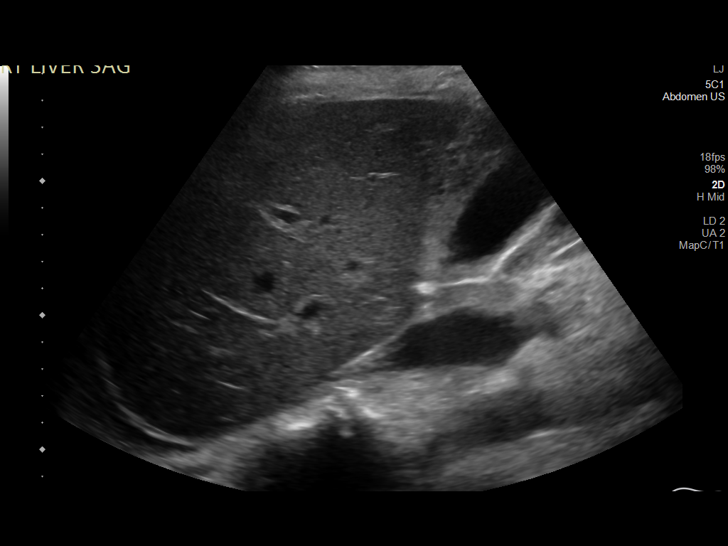
[im 64/64]
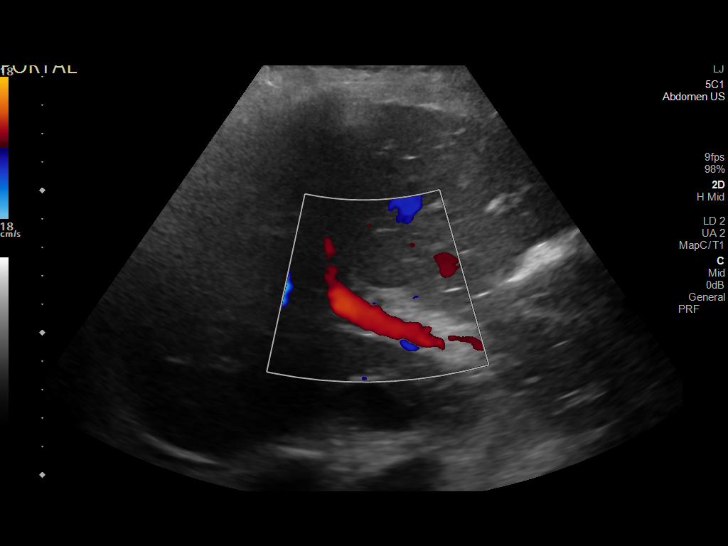

[14 of 25 positions shown; findings below may reference images not displayed]

FINDINGS: Gallbladder:

No gallstones or wall thickening visualized. Small amount of sludge
in the gallbladder. No sonographic Murphy sign noted by sonographer.

Common bile duct:

Diameter: 0.5 cm, within normal limits

Liver:

No focal lesion identified. Within normal limits in parenchymal
echogenicity. Portal vein is patent on color Doppler imaging with
normal direction of blood flow towards the liver.

Other: None.
IMPRESSION: 1. Mild amount of sludge in the gallbladder. Otherwise unremarkable.

2.  Normal sonographic appearance of the liver.

## 2023-05-13 NOTE — Therapy (Signed)
OUTPATIENT PHYSICAL THERAPY NEURO TREATMENT   Patient Name: Melanie Madden MRN: [id number] DOB:1965-12-23, 57 y.o., female Today's Date: 05/13/2023   PCP: Sigmund Hazel, MD REFERRING PROVIDER: Cathren Harsh, MD  END OF SESSION:  PT End of Session - 05/13/23 1253     Visit Number 3    Number of Visits 9    Date for PT Re-Evaluation 06/28/23    Authorization Type [employer]    PT Start Time 1315    PT Stop Time 1400    PT Time Calculation (min) 45 min    Equipment Utilized During Treatment Gait belt    Activity Tolerance Patient tolerated treatment well    Behavior During Therapy Agitated             Past Medical History:  Diagnosis Date   Allergy    Anxiety    Cancer of appendix (HCC) Stage 4    Depression    Expressive aphasia    Hemiparesis (HCC)    Right side   High triglycerides    History of kidney stones    Hypertension    Kidney stones    Recurrent sinus infections    Septic shock (HCC) 03/26/2023   Septic shock (HCC) 02/08/2021   Stroke (HCC) 07/09/2019   Past Surgical History:  Procedure Laterality Date   ANTERIOR CRUCIATE LIGAMENT REPAIR Left    BUNIONECTOMY Right    CYSTOSCOPY/URETEROSCOPY/HOLMIUM LASER/STENT PLACEMENT Right 02/08/2021   Procedure: CYSTOSCOPY/RETROGRADE/URETEROSCOPY/HOLMIUM LASER/STENT PLACEMENT, NEPHROSTOMY TUBE REMOVAL;  Surgeon: Rene Paci, MD;  Location: [facility];  Service: Urology;  Laterality: Right;   CYSTOSCOPY/URETEROSCOPY/HOLMIUM LASER/STENT PLACEMENT Bilateral 04/10/2023   Procedure: CYSTOSCOPY, BILATERAL URETEROSCOPY, BILATERAL RETROGRADE PYELOGRAM, HOLMIUM LASER LITHOTRIPSY, AND BILATERAL URETERAL STENT PLACEMENT, removal of percutanous stent post op;  Surgeon: Rene Paci, MD;  Location[facility];  Service: Urology;  Laterality: Bilateral;  90 MINUTES   IR NEPHROSTOMY PLACEMENT RIGHT  12/09/2020   IR NEPHROSTOMY PLACEMENT RIGHT  03/27/2023   LASIK  2008    LIPOSUCTION     LITHOTRIPSY     MENISCUS REPAIR Left    NASAL SEPTUM SURGERY     ROBOTIC ASSISTED TOTAL HYSTERECTOMY WITH BILATERAL SALPINGO OOPHERECTOMY Bilateral 12/06/2021   Procedure: XI ROBOTIC ASSISTED BILATERAL SALPINGO OOPHORECTOMY, TOTAL HYSTERECTOMY, PERITONEAL BIOPSY, OMENTECTOMY, LAPAROTOMY, APPENDECTOMY, PAP SMEAR;  Surgeon: Carver Fila, MD;  Location: [facility];  Service: Gynecology;  Laterality: Bilateral;   SHOULDER SURGERY Right    VARICOSE VEIN SURGERY Left    Patient Active Problem List   Diagnosis Date Noted   Sepsis secondary to UTI (HCC) 03/29/2023   Obstruction of right ureteropelvic junction (UPJ) due to stone 03/29/2023   Hydronephrosis 03/29/2023   Bacteremia 03/29/2023   Stroke (cerebrum) (HCC) 03/29/2023   Ovarian mass 12/06/2021   Pelvic mass in female 11/06/2021   Urinary retention    E. 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   Hypernatremia    Leukocytosis  Essential hypertension    Global aphasia    Cytotoxic brain edema (HCC) 07/10/2019   ICH (intracerebral hemorrhage) (HCC) 07/09/2019   Anxiety state 02/21/2015   Cephalalgia 12/21/2014    ONSET DATE: 2020  REFERRING DIAG: U98.11 (ICD-10-CM) - Status post CVA  THERAPY DIAG:  Unsteadiness on feet  Muscle weakness (generalized)  Abnormal posture  Other abnormalities of gait and mobility  Rationale for Evaluation and Treatment: Rehabilitation  SUBJECTIVE:                                                                                                                                                                                              SUBJECTIVE STATEMENT: Patient arrives to clinic with caregiver who stays in waiting room. Very high SBQC, significant R LE circumduction and genu recurvatum. Denies falls. Remains inconsistent with her yes/no. Patient writing down that her BP was 133/83 this AM.   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 coli UTI with obstructing right renal stone, appendiceal adenocarcinoma stage IV, diagnosed on TAH/BSO in 11/2021  PAIN:  Are you having pain?  Unable to verbalize, but when asked- pointed to her entire R side  PRECAUTIONS: Fall and Other: aphasic, R hemi  PATIENT GOALS: Caregiver endorses wanting her to work on her R side.  OBJECTIVE:  Note: Objective measures were completed at Evaluation unless otherwise noted.  DIAGNOSTIC FINDINGS:   MRI Brain 07/11/2019: IMPRESSION: 1. Large intra-axial hemorrhage in the left hemisphere centered at the left lentiform nucleus with estimated 53 mL volume appears not significantly changed in size since 07/09/2019. Superimposed restricted diffusion suggesting this is hemorrhagic transformation of an ischemic infarct. Surrounding edema and regional mass effect including mildly increased rightward midline shift of 6-7 mm. No ventricular trapping.  Basilar cisterns remain patent.   2. Trace intraventricular hemorrhage. No definite extra-axial extension of blood (although see #3).   3. Small indeterminate extra-axial collection or mass in the left prepontine cistern measuring 15 x 7 x 11 mm. 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 Small volume subdural hematoma less likely. Follow-up post-contrast brain MRI would be necessary to confirm.   4. Negative intracranial MRA aside  from some basilar artery dolichoectasia.  No recent relevant imaging.  VITALS  Vitals:   05/13/23 1329 05/13/23 1338  BP: (!) 146/98 (!) 177/109  Pulse:  (!) 128      TODAY'S TREATMENT:  Ther Act  Assessed vitals (see above) and diastolic initially elevated but did reduce to normal limits after several minutes of seated rest   [facility] PT Assessment - 05/13/23 0001       Standardized Balance Assessment   Standardized Balance Assessment 10 meter walk test    10 Meter Walk .61m/s            -attempted R LE advancement onto 4" box  -patient refusing  -BP remaining significantly elevated after minimal exertion -attempted sit <> stand with increased weight shift through R LE  -patient highly resistant to this   PATIENT EDUCATION: Education details: safe BP parameters, increasing R LE weight bearing  Person educated: Patient and Spouse  Education method: Explanation Education comprehension: verbalized understanding and needs further education  HOME EXERCISE PROGRAM: Verbally added proper sit to stands on 10/7 To be reviewed from previous POC: [id number]  GOALS: Goals reviewed with patient? Yes  SHORT TERM GOALS: Target date: 05/31/2023  Pt will decrease 5xSTS to <28.31 seconds w/ improved eccentric control in order to demonstrate decreased risk for falls and improved functional bilateral LE strength and power. Baseline:  33.31 sec w/ LUE support and uncontrolled descent Goal status: INITIAL  2. Pt will improve gait speed to >/= .17m/s to demonstrate improved community ambulation  Baseline:  .51m/s Goal status: INITIAL  3.  Pt will improve Berg score to 26/56 for decreased fall risk  Baseline:  23/56 Goal status: REVISED  4.  Pt will be independent and compliant with initial strength and balance HEP in order to maintain functional progress  and improve mobility. Baseline:  To be established. Goal status: INITIAL  LONG TERM GOALS: Target date: 06/28/2023  Pt will decrease 5xSTS to <23.31 seconds w/ improved eccentric control in order to demonstrate decreased risk for falls and improved functional bilateral LE strength and power. Baseline:  33.31 sec w/ LUE support and uncontrolled descent Goal status: INITIAL  2.  Pt will improve gait speed to >/= .43m/s to demonstrate improved community ambulation  Baseline:  .47m/s Goal status: REVISED  3.  Pt will improve Berg score to 30/56 for decreased fall risk  Baseline:  23/56 Goal status: REVISED   4.  Pt will be independent and compliant with finalized strength and balance HEP in order to maintain functional progress and improve mobility. Baseline:  To be established. Goal status: INITIAL  5.  Patient will be compliant to formal walking program >/= 3 days per week with supervision for safety to improve aerobic tolerance and ambulatory mechanics. Baseline:  To be established. Goal status: INITIAL  ASSESSMENT:  CLINICAL IMPRESSION: Patient seen for skilled PT session with emphasis on attempting R LE NMR. She remains agitated throughout session with limited ability to be redirected to task. Her BP increases significant, above therapeutic range, with minimal exertion. Attempted to educate patient on importance of monitoring her BP at home even after taking her rx. Patient resistant to this. Patient with guarded rehab potential due to this. Continue POC as able.   OBJECTIVE IMPAIRMENTS: Abnormal gait, decreased activity tolerance, decreased balance, decreased cognition, decreased coordination, decreased endurance, decreased knowledge of condition, decreased knowledge of use of DME, decreased mobility, difficulty walking, decreased ROM, decreased strength, decreased safety awareness, impaired perceived functional ability, impaired tone, impaired UE functional use, improper body  mechanics, and postural dysfunction.   ACTIVITY LIMITATIONS: carrying, lifting, bending, sitting, standing, squatting, stairs, transfers, and locomotion level  PARTICIPATION LIMITATIONS: meal prep, cleaning, laundry, interpersonal relationship, driving, shopping, community  activity, and occupation  PERSONAL FACTORS: Age, Behavior pattern, Fitness, Past/current experiences, Time since onset of injury/illness/exacerbation, and 1-2 comorbidities: hemorrhagic CVA  are also affecting patient's functional outcome.   REHAB POTENTIAL: Poor see PMH and personal factors  CLINICAL DECISION MAKING: Evolving/moderate complexity  EVALUATION COMPLEXITY: Moderate  PLAN:  PT FREQUENCY: 1x/week  PT DURATION: 8 weeks  PLANNED INTERVENTIONS: Therapeutic exercises, Therapeutic activity, Neuromuscular re-education, Balance training, Gait training, Patient/Family education, Self Care, Stair training, Vestibular training, Visual/preceptual remediation/compensation, Orthotic/Fit training, DME instructions, Electrical stimulation, Manual therapy, and Re-evaluation  PLAN FOR NEXT SESSION:    Add to and review old HEP for R hemibody weakness and static balance.  Gait training.  R NMR.  Walking program?  Endurance. Quad strength   Westley Foots, PT Westley Foots, PT, DPT, CBIS  05/13/2023, 3:18 PM

## 2023-05-14 IMAGING — CT CT HEAD W/O CM
4 series · 16 of 47 positions shown, 18 images · non-contrast
Comparison: Multiple prior studies including 12/29/2019 and
07/11/2019

CLINICAL DATA: Mental status changes.  History of CVA.

EXAM:
CT HEAD WITHOUT CONTRAST
TECHNIQUE: Contiguous axial images were obtained from the base of the skull
through the vertex without intravenous contrast.

[Series 3: head wo · axial · 0.42mm/px · z∈[+1164,+1284]mm · 7 of 32 slices shown, 9 images]
[im 4/32  brain]
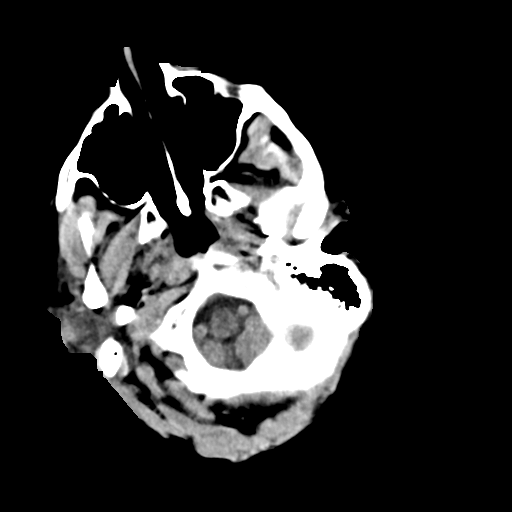
[im 4/32  bone]
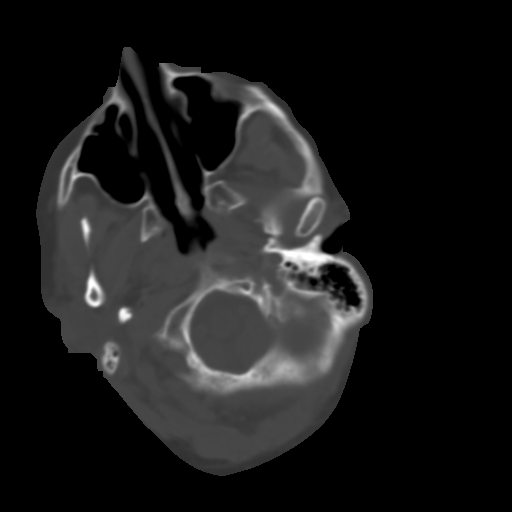
[im 8/32  brain]
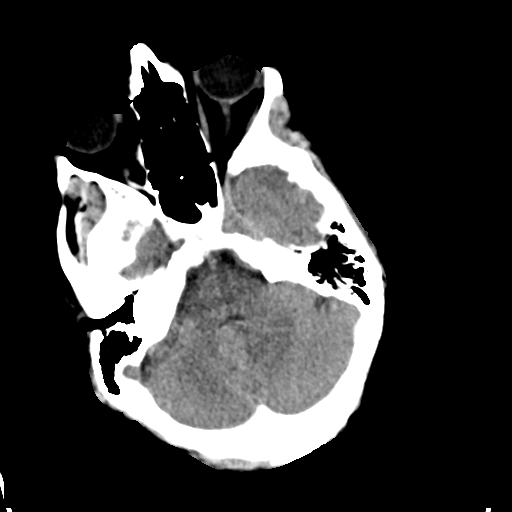
[im 12/32  brain]
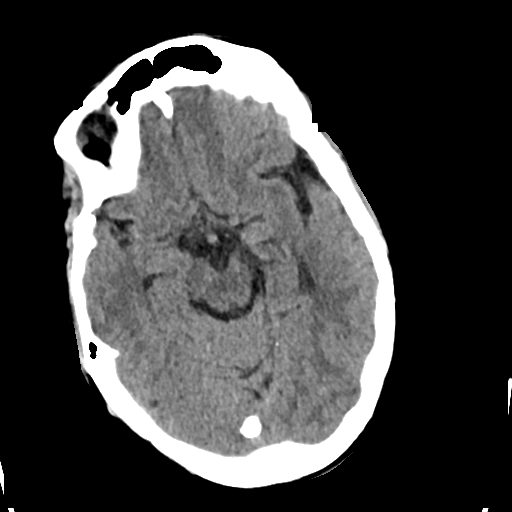
[im 16/32  brain]
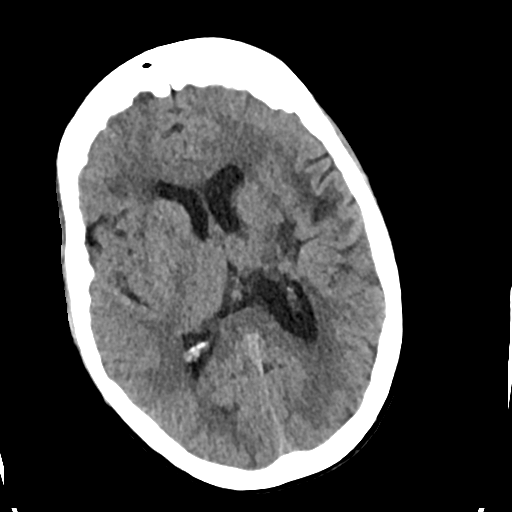
[im 20/32  brain]
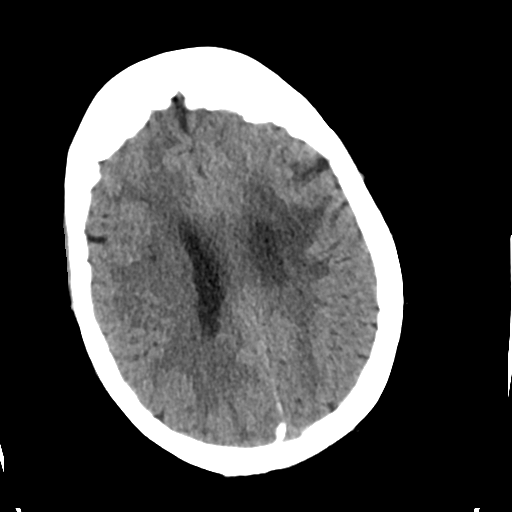
[im 20/32  bone]
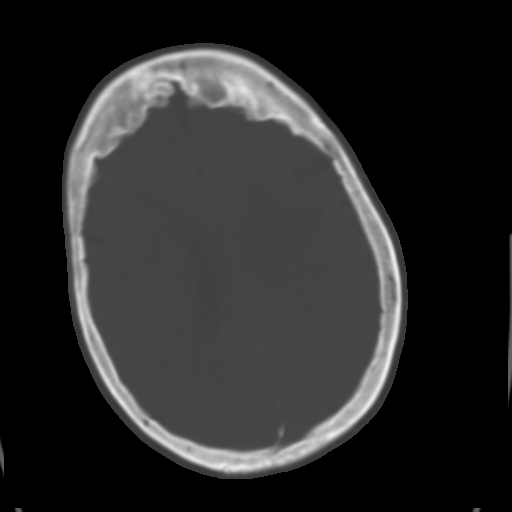
[im 24/32  brain]
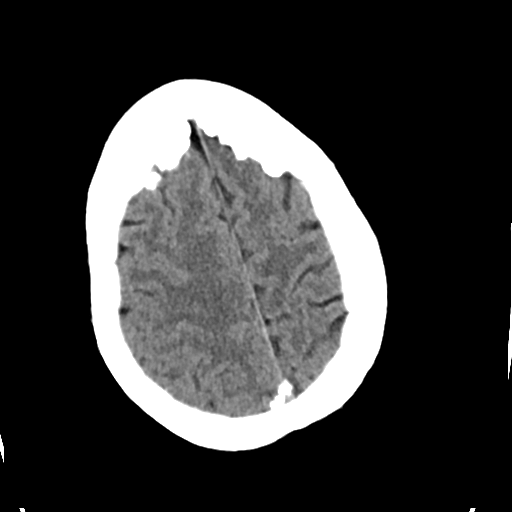
[im 28/32  brain]
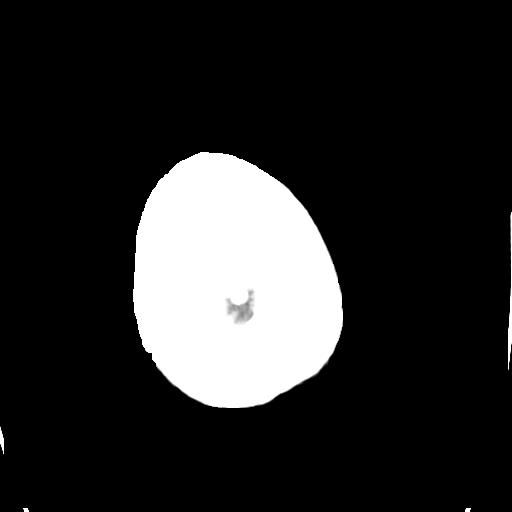

[Series 4: head bone · axial · 0.42mm/px · z∈[+1162,+1194]mm · 3 of 79 slices shown]
[im 8/79  bone]
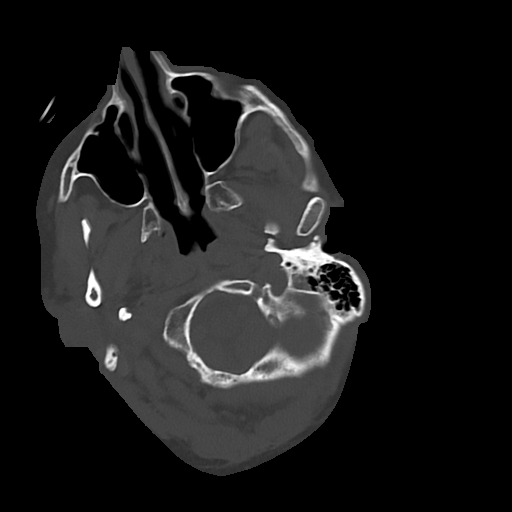
[im 16/79  bone]
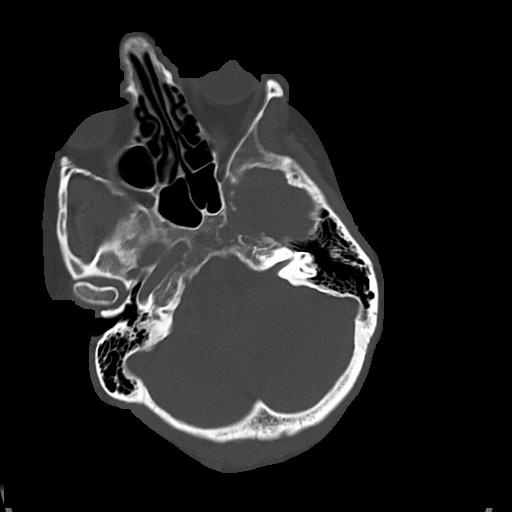
[im 24/79  bone]
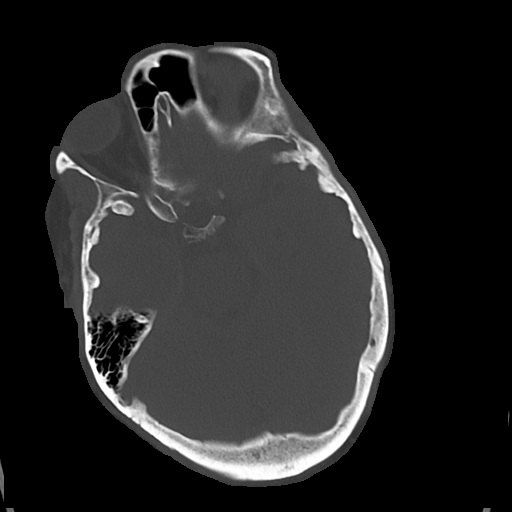

[Series 5: cor soft · coronal · 0.32mm/px · 3 of 73 slices shown]
[im 25/73  brain]
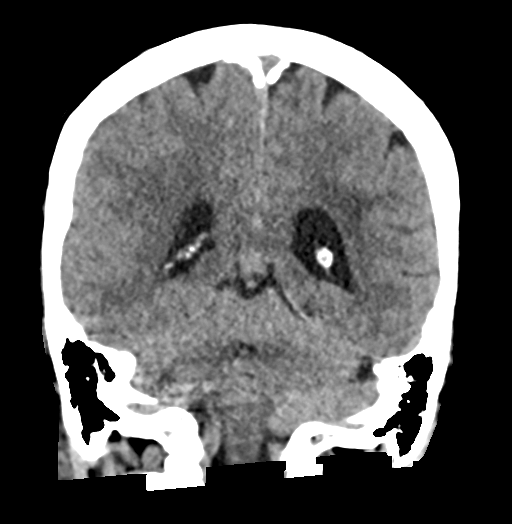
[im 33/73  brain]
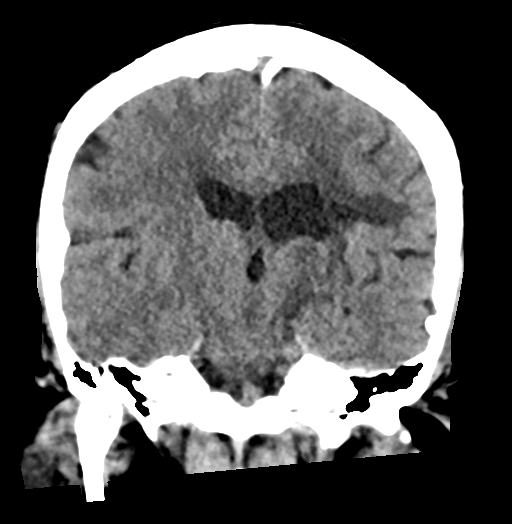
[im 41/73  brain]
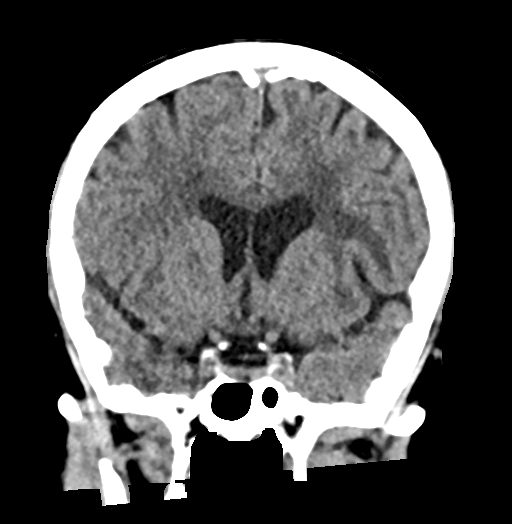

[Series 6: sag soft · sagittal · 0.34mm/px · 3 of 55 slices shown]
[im 19/55  brain]
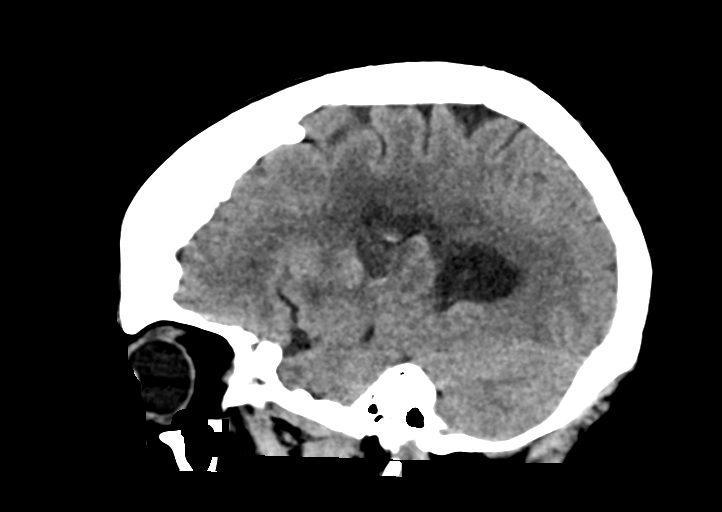
[im 28/55  brain]
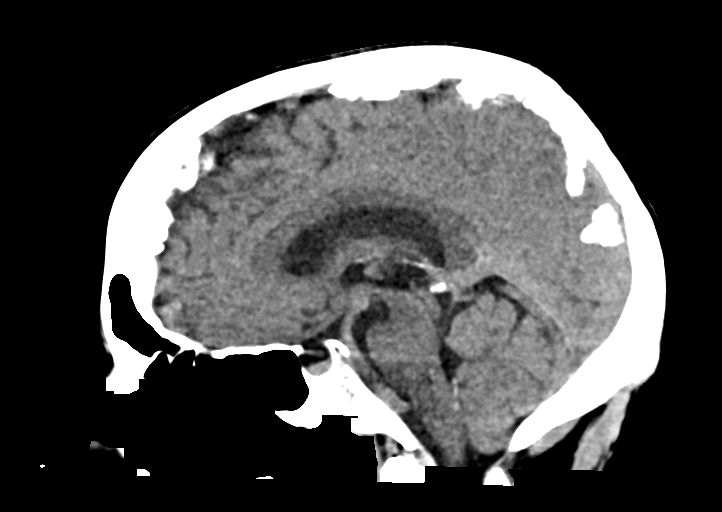
[im 37/55  brain]
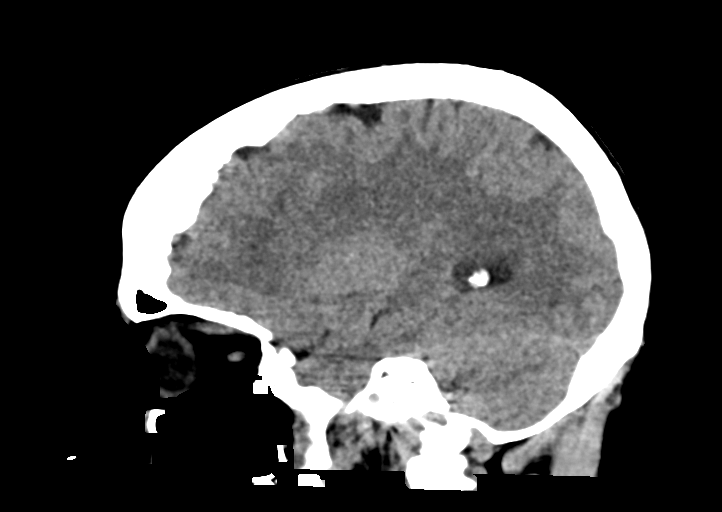

[16 of 47 positions shown; findings below may reference images not displayed]

FINDINGS: Brain: There is persistent encephalomalacia of the LEFT frontal
lobe, temporal lobe, LEFT corona radiata, and LEFT thalamus
consistent with remote intracranial hemorrhage. There is mild
central and cortical atrophy. Periventricular white matter changes
are consistent with small vessel disease. A 7 millimeter extra-axial
calcified mass in the LEFT frontal lobe likely represents a small
meningioma and is not associated with surrounding edema.

Vascular: No hyperdense vessel or unexpected calcification.

Skull: Normal. Negative for fracture or focal lesion.

Sinuses/Orbits: No acute finding.

Other: None
IMPRESSION: 1. Remote changes consistent with previous intracranial hemorrhage.
2.  No evidence for acute  abnormality.
3. Atrophy and small vessel disease.
4. Probable small 7 millimeter meningioma in the LEFT frontal lobe,
not associated with edema.

## 2023-05-20 ENCOUNTER — Encounter: Payer: Self-pay | Admitting: Occupational Therapy

## 2023-05-20 ENCOUNTER — Ambulatory Visit: Payer: Managed Care, Other (non HMO) | Admitting: Occupational Therapy

## 2023-05-20 ENCOUNTER — Ambulatory Visit: Payer: Managed Care, Other (non HMO) | Admitting: Physical Therapy

## 2023-05-20 VITALS — BP 151/97 | HR 109

## 2023-05-20 DIAGNOSIS — I69151 Hemiplegia and hemiparesis following nontraumatic intracerebral hemorrhage affecting right dominant side: Secondary | ICD-10-CM

## 2023-05-20 DIAGNOSIS — I69251 Hemiplegia and hemiparesis following other nontraumatic intracranial hemorrhage affecting right dominant side: Secondary | ICD-10-CM

## 2023-05-20 DIAGNOSIS — R278 Other lack of coordination: Secondary | ICD-10-CM

## 2023-05-20 DIAGNOSIS — R2689 Other abnormalities of gait and mobility: Secondary | ICD-10-CM

## 2023-05-20 DIAGNOSIS — M6281 Muscle weakness (generalized): Secondary | ICD-10-CM

## 2023-05-20 DIAGNOSIS — S43001D Unspecified subluxation of right shoulder joint, subsequent encounter: Secondary | ICD-10-CM

## 2023-05-20 NOTE — Therapy (Signed)
OUTPATIENT PHYSICAL THERAPY NEURO TREATMENT   Patient Name: Melanie Madden MRN: [id number] DOB:10-06-1965, 57 y.o., female Today's Date: 05/20/2023   PCP: Sigmund Hazel, MD REFERRING PROVIDER: Cathren Harsh, MD  END OF SESSION:  PT End of Session - 05/20/23 1407     Visit Number 4    Number of Visits 9    Date for PT Re-Evaluation 06/28/23    Authorization Type [employer]    PT Start Time 1405    PT Stop Time 1441   Pt requesting to use restroom   PT Time Calculation (min) 36 min    Equipment Utilized During Treatment --    Activity Tolerance Patient tolerated treatment well    Behavior During Therapy [facility] for tasks assessed/performed              Past Medical History:  Diagnosis Date   Allergy    Anxiety    Cancer of appendix (HCC) Stage 4    Depression    Expressive aphasia    Hemiparesis (HCC)    Right side   High triglycerides    History of kidney stones    Hypertension    Kidney stones    Recurrent sinus infections    Septic shock (HCC) 03/26/2023   Septic shock (HCC) 02/08/2021   Stroke (HCC) 07/09/2019   Past Surgical History:  Procedure Laterality Date   ANTERIOR CRUCIATE LIGAMENT REPAIR Left    BUNIONECTOMY Right    CYSTOSCOPY/URETEROSCOPY/HOLMIUM LASER/STENT PLACEMENT Right 02/08/2021   Procedure: CYSTOSCOPY/RETROGRADE/URETEROSCOPY/HOLMIUM LASER/STENT PLACEMENT, NEPHROSTOMY TUBE REMOVAL;  Surgeon: Rene Paci, MD;  Location: [facility];  Service: Urology;  Laterality: Right;   CYSTOSCOPY/URETEROSCOPY/HOLMIUM LASER/STENT PLACEMENT Bilateral 04/10/2023   Procedure: CYSTOSCOPY, BILATERAL URETEROSCOPY, BILATERAL RETROGRADE PYELOGRAM, HOLMIUM LASER LITHOTRIPSY, AND BILATERAL URETERAL STENT PLACEMENT, removal of percutanous stent post op;  Surgeon: Rene Paci, MD;  Location: [facility];  Service: Urology;  Laterality: Bilateral;  90 MINUTES   IR NEPHROSTOMY PLACEMENT RIGHT  12/09/2020   IR NEPHROSTOMY  PLACEMENT RIGHT  03/27/2023   LASIK  2008   LIPOSUCTION     LITHOTRIPSY     MENISCUS REPAIR Left    NASAL SEPTUM SURGERY     ROBOTIC ASSISTED TOTAL HYSTERECTOMY WITH BILATERAL SALPINGO OOPHERECTOMY Bilateral 12/06/2021   Procedure: XI ROBOTIC ASSISTED BILATERAL SALPINGO OOPHORECTOMY, TOTAL HYSTERECTOMY, PERITONEAL BIOPSY, OMENTECTOMY, LAPAROTOMY, APPENDECTOMY, PAP SMEAR;  Surgeon: Carver Fila, MD;  Location: [facility];  Service: Gynecology;  Laterality: Bilateral;   SHOULDER SURGERY Right    VARICOSE VEIN SURGERY Left    Patient Active Problem List   Diagnosis Date Noted   Sepsis secondary to UTI (HCC) 03/29/2023   Obstruction of right ureteropelvic junction (UPJ) due to stone 03/29/2023   Hydronephrosis 03/29/2023   Bacteremia 03/29/2023   Stroke (cerebrum) (HCC) 03/29/2023   Ovarian mass 12/06/2021   Pelvic mass in female 11/06/2021   Urinary retention    E. 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   Nontraumatic subcortical hemorrhage of left cerebral hemisphere (HCC) 08/27/2019   Acute blood loss anemia    Hypoalbuminemia due to protein-calorie malnutrition (HCC)    Thrombocytopenia (HCC)    Dysphagia 07/29/2019   Infarction of left basal ganglia (HCC)  07/18/2019   Hypernatremia    Leukocytosis    Essential hypertension    Global aphasia    Cytotoxic brain edema (HCC) 07/10/2019   ICH (intracerebral hemorrhage) (HCC) 07/09/2019   Anxiety state 02/21/2015   Cephalalgia 12/21/2014    ONSET DATE: 2020  REFERRING DIAG: H08.65 (ICD-10-CM) - Status post CVA  THERAPY DIAG:  Muscle weakness (generalized)  Other abnormalities of gait and mobility  Hemiplegia and hemiparesis following nontraumatic intracerebral hemorrhage affecting right  dominant side (HCC)  Rationale for Evaluation and Treatment: Rehabilitation  SUBJECTIVE:                                                                                                                                                                                             SUBJECTIVE STATEMENT: Patient arrives to clinic with caregiver, Misty Stanley. Denies pain or acute changes.   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 coli UTI with obstructing right renal stone, appendiceal adenocarcinoma stage IV, diagnosed on TAH/BSO in 11/2021  PAIN:  Are you having pain? No  PRECAUTIONS: Fall and Other: aphasic, R hemi  PATIENT GOALS: Caregiver endorses wanting her to work on her R side.  OBJECTIVE:  Note: Objective measures were completed at Evaluation unless otherwise noted.  DIAGNOSTIC FINDINGS:   MRI Brain 07/11/2019: IMPRESSION: 1. Large intra-axial hemorrhage in the left hemisphere centered at the left lentiform nucleus with estimated 53 mL volume appears not significantly changed in size since 07/09/2019. Superimposed restricted diffusion suggesting this is hemorrhagic transformation of an ischemic infarct. Surrounding edema and regional mass effect including mildly increased rightward midline shift of 6-7 mm. No ventricular trapping.  Basilar cisterns remain patent.   2. Trace intraventricular hemorrhage. No definite extra-axial extension of blood (although see #3).   3. Small indeterminate extra-axial collection or mass in the left prepontine cistern measuring 15 x 7 x 11 mm. 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 Small volume subdural hematoma less likely. Follow-up post-contrast brain MRI would be necessary to confirm.   4. 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  No recent relevant imaging.  VITALS  Vitals:   05/20/23 1408   BP: (!) 151/97  Pulse: (!) 109       TODAY'S TREATMENT:  Ther Act  Assessed vitals (see above) and BP elevated but pt motioned that this number is normal for her   Ther Ex  Established initial HEP for improved R quad strength, glute strength and transfers. Pt performed sit to supine w/min A for RLE management:  Supine SAQ w/knees elevated on bolster w/min-mod AAROM on RLE only, 2x8 reps w/tactile cues applied to R quad throughout. Pt demonstrates delayed motor recruitment but was able to lift RLE independently on final rep.  Supine glute bridges w/pelvic tilt, x20 reps w/2-3s isometric hold. Min A required to stabilize RLE throughout. Pt unable to lift hips fully off mat but was able to facilitate bilateral glute contraction.  Pt performed supine > sit w/min A for RLE management and began to report pain in R breast, lats and scapular region. Rating pain as 7/10. Pt TTP along paraspinals and rhomboids but did report relief w/scapular retraction, x5 reps, so added to HEP.  Performed x5 sit to stands w/emphasis on equal weight shift through BLEs and good eccentric control. Pt able to perform stand w/min A for RLE placement and HHA once standing. Noted good eccentric control on RLE this date w/ability to maintain midline orientation.  Pt reported need to use restroom at end of session, so ended session early.    PATIENT EDUCATION: Education details: Initial HEP  Person educated: Patient and Designer, fashion/clothing method: Explanation, Demonstration, Tactile cues, Verbal cues, and Handouts Education comprehension: returned demonstration, tactile cues required, and needs further education  HOME EXERCISE PROGRAM: Access Code: [email or web address] URL: [email or web address] Date: 05/20/2023 Prepared by: Alethia Berthold Maribella Kuna  Exercises - Supine Short Arc Quad  - 1 x  daily - 7 x weekly - 1-2 sets - 10 reps - Supine Bridge  - 1 x daily - 7 x weekly - 3 sets - 10 reps - Seated Scapular Retraction  - 1 x daily - 7 x weekly - 3 sets - 10 reps - Sit to Stand with Armchair  - 1 x daily - 7 x weekly - 1-2 sets - 5-7 reps  GOALS: Goals reviewed with patient? Yes  SHORT TERM GOALS: Target date: 05/31/2023  Pt will decrease 5xSTS to <28.31 seconds w/ improved eccentric control in order to demonstrate decreased risk for falls and improved functional bilateral LE strength and power. Baseline:  33.31 sec w/ LUE support and uncontrolled descent Goal status: INITIAL  2. Pt will improve gait speed to >/= .37m/s to demonstrate improved community ambulation  Baseline:  .80m/s Goal status: INITIAL  3.  Pt will improve Berg score to 26/56 for decreased fall risk  Baseline:  23/56 Goal status: REVISED  4.  Pt will be independent and compliant with initial strength and balance HEP in order to maintain functional progress and improve mobility. Baseline:  To be established. Goal status: INITIAL  LONG TERM GOALS: Target date: 06/28/2023  Pt will decrease 5xSTS to <23.31 seconds w/ improved eccentric control in order to demonstrate decreased risk for falls and improved functional bilateral LE strength and power. Baseline:  33.31 sec w/ LUE support and uncontrolled descent Goal status: INITIAL  2.  Pt will improve gait speed to >/= .20m/s to demonstrate improved community ambulation  Baseline:  .24m/s Goal status: REVISED  3.  Pt will improve Berg score to 30/56 for decreased fall risk  Baseline:  23/56 Goal status: REVISED   4.  Pt will be independent and compliant with finalized strength and balance HEP in order to maintain functional progress  and improve mobility. Baseline:  To be established. Goal status: INITIAL  5.  Patient will be compliant to formal walking program >/= 3 days per week with supervision for safety to improve aerobic tolerance and  ambulatory mechanics. Baseline:  To be established. Goal status: INITIAL  ASSESSMENT:  CLINICAL IMPRESSION: Emphasis of skilled PT session on establishing initial HEP for improved R quad strength, transfers and pain modulation. Pt w/increased pain in R paraspinals, scapula and lats this date that did not dissipate w/exercise or manual therapy. Pt did demonstrate improved midline shift w/sit to stands this date as well as improved eccentric control on RLE. Continue POC.   OBJECTIVE IMPAIRMENTS: Abnormal gait, decreased activity tolerance, decreased balance, decreased cognition, decreased coordination, decreased endurance, decreased knowledge of condition, decreased knowledge of use of DME, decreased mobility, difficulty walking, decreased ROM, decreased strength, decreased safety awareness, impaired perceived functional ability, impaired tone, impaired UE functional use, improper body mechanics, and postural dysfunction.   ACTIVITY LIMITATIONS: carrying, lifting, bending, sitting, standing, squatting, stairs, transfers, and locomotion level  PARTICIPATION LIMITATIONS: meal prep, cleaning, laundry, interpersonal relationship, driving, shopping, community activity, and occupation  PERSONAL FACTORS: Age, Behavior pattern, Fitness, Past/current experiences, Time since onset of injury/illness/exacerbation, and 1-2 comorbidities: hemorrhagic CVA  are also affecting patient's functional outcome.   REHAB POTENTIAL: Poor see PMH and personal factors  CLINICAL DECISION MAKING: Evolving/moderate complexity  EVALUATION COMPLEXITY: Moderate  PLAN:  PT FREQUENCY: 1x/week  PT DURATION: 8 weeks  PLANNED INTERVENTIONS: Therapeutic exercises, Therapeutic activity, Neuromuscular re-education, Balance training, Gait training, Patient/Family education, Self Care, Stair training, Vestibular training, Visual/preceptual remediation/compensation, Orthotic/Fit training, DME instructions, Electrical stimulation,  Manual therapy, and Re-evaluation  PLAN FOR NEXT SESSION:  How is HEP? Gait training.  R NMR.  Walking program?  Endurance. Quad strength   Jill Alexanders Kashon Kraynak, PT, DPT  05/20/2023, 2:50 PM

## 2023-05-20 NOTE — Therapy (Unsigned)
OUTPATIENT OCCUPATIONAL THERAPY NEURO EVALUATION  Patient Name: Melanie Madden MRN: [id number] DOB:[birth year], 57 y.o., female Today's Date: 05/20/2023  PCP: Sigmund Hazel, MD REFERRING PROVIDER: Cathren Harsh, MD  END OF SESSION:  OT End of Session - 05/20/23 1323     Visit Number 1    Number of Visits 6    Authorization Type [employer] 2024 $50 copay per day; VL; 60 (combined PT/OT/ST) multi D/same day visits = 1 visit Auth Reqd/ Med Review @ 5th visit    Authorization - Number of Visits 5    OT Start Time 1320    OT Stop Time 1400    OT Time Calculation (min) 40 min    Activity Tolerance Patient tolerated treatment well    Behavior During Therapy WFL for tasks assessed/performed             Past Medical History:  Diagnosis Date   Allergy    Anxiety    Cancer of appendix (HCC) Stage 4    Depression    Expressive aphasia    Hemiparesis (HCC)    Right side   High triglycerides    History of kidney stones    Hypertension    Kidney stones    Recurrent sinus infections    Septic shock (HCC) 03/26/2023   Septic shock (HCC) 02/08/2021   Stroke (HCC) 07/09/2019   Past Surgical History:  Procedure Laterality Date   ANTERIOR CRUCIATE LIGAMENT REPAIR Left    BUNIONECTOMY Right    CYSTOSCOPY/URETEROSCOPY/HOLMIUM LASER/STENT PLACEMENT Right 02/08/2021   Procedure: CYSTOSCOPY/RETROGRADE/URETEROSCOPY/HOLMIUM LASER/STENT PLACEMENT, NEPHROSTOMY TUBE REMOVAL;  Surgeon: Rene Paci, MD;  Location: [facility];  Service: Urology;  Laterality: Right;   CYSTOSCOPY/URETEROSCOPY/HOLMIUM LASER/STENT PLACEMENT Bilateral 04/10/2023   Procedure: CYSTOSCOPY, BILATERAL URETEROSCOPY, BILATERAL RETROGRADE PYELOGRAM, HOLMIUM LASER LITHOTRIPSY, AND BILATERAL URETERAL STENT PLACEMENT, removal of percutanous stent post op;  Surgeon: Rene Paci, MD;  Location: [facility];  Service: Urology;  Laterality: Bilateral;  90 MINUTES   IR NEPHROSTOMY  PLACEMENT RIGHT  12/09/2020   IR NEPHROSTOMY PLACEMENT RIGHT  03/27/2023   LASIK  2008   LIPOSUCTION     LITHOTRIPSY     MENISCUS REPAIR Left    NASAL SEPTUM SURGERY     ROBOTIC ASSISTED TOTAL HYSTERECTOMY WITH BILATERAL SALPINGO OOPHERECTOMY Bilateral 12/06/2021   Procedure: XI ROBOTIC ASSISTED BILATERAL SALPINGO OOPHORECTOMY, TOTAL HYSTERECTOMY, PERITONEAL BIOPSY, OMENTECTOMY, LAPAROTOMY, APPENDECTOMY, PAP SMEAR;  Surgeon: Carver Fila, MD;  Location: [facility];  Service: Gynecology;  Laterality: Bilateral;   SHOULDER SURGERY Right    VARICOSE VEIN SURGERY Left    Patient Active Problem List   Diagnosis Date Noted   Sepsis secondary to UTI (HCC) 03/29/2023   Obstruction of right ureteropelvic junction (UPJ) due to stone 03/29/2023   Hydronephrosis 03/29/2023   Bacteremia 03/29/2023   Stroke (cerebrum) (HCC) 03/29/2023   Ovarian mass 12/06/2021   Pelvic mass in female 11/06/2021   Urinary retention    E. 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   Hypernatremia    Leukocytosis    Essential hypertension    Global aphasia    Cytotoxic brain edema (HCC) 07/10/2019   ICH (intracerebral hemorrhage) (HCC) 07/09/2019   Anxiety state 02/21/2015   Cephalalgia 12/21/2014    ONSET DATE: 04/01/2023  REFERRING DIAG: Hemiplegia and hemiparesis following nontraumatic intracerebral hemorrhage affecting right dominant side (HCC)  THERAPY DIAG:  Muscle weakness (generalized)  Hemiplegia  and hemiparesis following nontraumatic intracerebral hemorrhage affecting right dominant side (HCC)  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  When asked about OT goals, pt gestured to her R arm.  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 coli UTI with obstructing right renal stone, appendiceal adenocarcinoma stage IV, diagnosed on TAH/BSO in 11/2021  Patient hospitalized due to urosepsis from an obstructing 15 mm right UPJ stone. She underwent right-sided nephrostomy tube on 03/27/2023   PRECAUTIONS: None  WEIGHT BEARING RESTRICTIONS: No  PAIN:  Are you having pain? No  FALLS: Has patient fallen in last 6 months? No  Lives with: lives with their spouse; caregiver (CNA) present everyday 5am until her husband gets home Lives in: House/apartment Stairs: Yes: Internal: 16 steps; can reach both and External: 2 steps; none Has following equipment at home: Quad cane small base, Environmental consultant - 4 wheeled, Wheelchair (manual), Shower bench, and chair lift  PLOF: Independent - Prior to 2021  PATIENT GOALS: Points to R side when asked.  OBJECTIVE:  Note: Objective measures were completed at Evaluation unless otherwise noted.  HAND DOMINANCE: Ambidextrous - previously.  ADLs: Overall ADLs: Pt indicates she does it herself and caregiver confirms this by reports of husband also. Transfers/ambulation related to ADLs: Eating: Ind with L hand Grooming: Help with hair as needed UB Dressing: Pt reports she dresses herself - doesn't  do buttons  LB Dressing: Dons socks, shoes and brace Toileting: Ind Bathing: Pt reports showering herself and transferring on her own Tub Shower transfers: Mod I with shower bench Equipment: Transfer tub bench  IADLs: Shopping: Spouse or caregivers Light housekeeping: Caregivers Meal Prep: Caregivers Community mobility: Supervised with quad cane Medication management: Husband Financial management: Husband Handwriting:  NT  MOBILITY STATUS: Independent and difficulty carrying objections with ambulation  POSTURE COMMENTS:  forward head and Flaccid RUE Sitting balance: No   ACTIVITY TOLERANCE: Activity tolerance: Fair, reliant on quad cane for all mobility, slow pace  FUNCTIONAL OUTCOME MEASURES: NT  UPPER EXTREMITY ROM:    ROM Right Eval - PROM Left Eval -  AROM  Shoulder flexion 85 WNL   Shoulder abduction 75   Shoulder adduction    Shoulder extension    Shoulder internal rotation    Shoulder external rotation    Elbow flexion WNL   Elbow extension WNL   Wrist flexion WNL   Wrist extension WNL   Wrist ulnar deviation    Wrist radial deviation    Wrist pronation    Wrist supination    (Blank rows = not tested)  UPPER EXTREMITY MMT:     MMT Right eval  Left eval  Shoulder flexion Generally 0 to 1(flickers) mostly with associate reactions (yawning - elbow flexes) and slight finger flexion/grip 4  Shoulder abduction    Shoulder adduction    Shoulder extension    Shoulder internal rotation    Shoulder external rotation    Middle trapezius    Lower trapezius    Elbow flexion    Elbow extension    Wrist flexion    Wrist extension    Wrist ulnar deviation    Wrist radial deviation    Wrist pronation    Wrist supination    (Blank rows = not tested)  HAND FUNCTION: L - WFL grip 57.9, 47.3, 47.6 - Average R - 50.9 lbs R - NA  COORDINATION: L - WFL  R - NA  SENSATION: R side feels "different"  EDEMA: Pt reports NA  MUSCLE TONE: RUE:  Flaccid  COGNITION: Overall cognitive status: Difficulty to assess due to: Communication impairment  VISION: Subjective report: Pt preports no difficulty Baseline vision: No visual deficits Visual history: NA  VISION ASSESSMENT: Not tested  Patient has difficulty with following activities due to following visual impairments: None reported  PERCEPTION: Not tested  PRAXIS: Not tested  OBSERVATIONS: Pt ambulates with quad cane with no loss of balance but some difficulty following directions to step up to the table so OTR could bring the chair behind her to sit.  She has very flaccid R UE that simple hangs during ambulation and has a significant subluxation.  Pt has aphasia but is able to articulate a few individual words - yes/no etc.  Pt needs extra time for sit to stand with some stability of chair to get up.     TODAY'S TREATMENT:                                                                                                                               Therapeutic Activities: Pt engaged in R UE AAROM with OTR support of forearm and wrist for elbow flexion/extension to attempt to elicit flickers of movement as she does report limb moving when she yawns.  Used an inflated glove in her R hand to show how pt can get minimal squeeze power ie) better than when using a stress ball. Introduced options for home activity considerations via online search, video and print out to pt of a Neuro Glove option that can be used at home for [facility] of digits for family consideration.  Instructed in positioning of R UE ie) to minimize subluxation and improve overall comfort of flaccid RUE.   PATIENT EDUCATION: Education details: OT role and POC considerations with options ie) Neurorehab glove, estim tx Person educated: Patient and Caregiver Armed forces logistics/support/administrative officer method: Explanation, Verbal cues, and Handouts Education comprehension: verbalized understanding and needs further education  HOME EXERCISE  PROGRAM: TBD   GOALS: Goals reviewed with patient? Yes  SHORT TERM GOALS: Target date: 06/07/23  Patient/caregiver will demonstrate initial RUE PROM/AAROM  HEP with 25% verbal cues or less for proper execution. Baseline: New to outpt OT - flaccid RUE Goal status: INITIAL  2.  Patient and family will be aware of home based options for HEP (Neuro Rehab glove, estim, kinesio taping). Baseline: New to outpt OT - flaccid RUE Goal status: IN Progress   LONG TERM GOALS: Target date: 07/05/23  Patient/caregiver will demonstrate updated RUE HEP with handouts only for proper execution. Baseline: New to outpt OT - flaccid RUE Goal status: INITIAL  2.  Caregivers will be able to apply kinesio tape for L shoulder subluxation support 1-2x/week. Baseline: New to outpt OT - flaccid RUE Goal status: INITIAL  3.  Caregivers will be independent with neuro re-ed activities for R UE stimulation and AAROM as deemed available and appropriate by family for home use (ie. Neuro Rehab glove and/or estim) Baseline: New to outpt OT - flaccid RUE Goal status: INITIAL  4.  Caregivers will be able to assist with splint application as needed/tolerated by pt for R thumb ROM and sensory stimulation of R hand and shoulder subluxation as tolerated. Baseline: New to outpt OT - flaccid RUE but some thumb flexion tightness; shoulder subluxation > thumb width Goal status: INITIAL  ASSESSMENT:  CLINICAL IMPRESSION: Patient is a 57 y.o. female who was seen today for occupational therapy evaluation for chronic CVA with R flaccid hemiplegia. 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 coli UTI with obstructing right renal stone, appendiceal adenocarcinoma stage IV diagnosed 11/2021 and recent hospitalization due to urosepsis. Patient currently presents with significant R UE non-function and would benefit from skilled OT services in the outpatient  setting to work on impairments as to help pt establish HEP for carryover at home in hopes of further neuro return over time as able.     PERFORMANCE DEFICITS: in functional skills including ADLs, IADLs, coordination, dexterity, sensation, tone, ROM, strength, Fine motor control, Gross motor control, balance, endurance, decreased knowledge of precautions, decreased knowledge of use of DME, and UE functional use, cognitive skills including memory, problem solving, safety awareness, sequencing, temperament/personality, thought, and understand, and psychosocial skills including coping strategies, environmental adaptation, and interpersonal interactions.   IMPAIRMENTS: are limiting patient from ADLs, IADLs, leisure, and social participation.   CO-MORBIDITIES: may have co-morbidities  that affects occupational performance. Patient will benefit from skilled OT to address above impairments and improve overall function.  MODIFICATION OR ASSISTANCE TO COMPLETE EVALUATION: No modification of tasks or assist necessary to complete an evaluation.  OT OCCUPATIONAL PROFILE AND HISTORY: Problem focused assessment: Including review of records relating to presenting problem.  CLINICAL DECISION MAKING: LOW - limited treatment options, no task modification necessary  REHAB POTENTIAL: Fair due to chronicity of CVA  EVALUATION COMPLEXITY: Low    PLAN:  OT FREQUENCY: 1x/week  OT DURATION: 6 weeks  PLANNED INTERVENTIONS: 97535 self care/ADL training, 13086 therapeutic exercise, 97530 therapeutic activity, 97112 neuromuscular re-education, 97140 manual therapy, [id number] electrical stimulation (manual), 97014 electrical stimulation unattended, 97760 Orthotics management and training, 57846 Splinting (initial encounter), [id number] Subsequent splinting/medication, passive range of motion, coping strategies training, patient/family education, and DME and/or AE instructions  RECOMMENDED OTHER SERVICES: Pt has PT services in  place at this time.  CONSULTED AND AGREED WITH PLAN OF CARE: Patient and Caregiver  PLAN FOR NEXT SESSION:  HEP initiation Neuro Glove considerations Estim trials  Victorino Sparrow, OT 05/20/2023, 6:18 PM

## 2023-05-27 ENCOUNTER — Encounter: Payer: Self-pay | Admitting: Occupational Therapy

## 2023-05-27 ENCOUNTER — Ambulatory Visit: Payer: Managed Care, Other (non HMO) | Admitting: Occupational Therapy

## 2023-05-27 ENCOUNTER — Ambulatory Visit: Payer: Managed Care, Other (non HMO)

## 2023-05-27 VITALS — BP 155/98 | HR 104

## 2023-05-27 DIAGNOSIS — S43001D Unspecified subluxation of right shoulder joint, subsequent encounter: Secondary | ICD-10-CM

## 2023-05-27 DIAGNOSIS — M6281 Muscle weakness (generalized): Secondary | ICD-10-CM

## 2023-05-27 DIAGNOSIS — R278 Other lack of coordination: Secondary | ICD-10-CM

## 2023-05-27 DIAGNOSIS — R293 Abnormal posture: Secondary | ICD-10-CM

## 2023-05-27 DIAGNOSIS — R2689 Other abnormalities of gait and mobility: Secondary | ICD-10-CM

## 2023-05-27 DIAGNOSIS — I69151 Hemiplegia and hemiparesis following nontraumatic intracerebral hemorrhage affecting right dominant side: Secondary | ICD-10-CM | POA: Diagnosis not present

## 2023-05-27 DIAGNOSIS — I69251 Hemiplegia and hemiparesis following other nontraumatic intracranial hemorrhage affecting right dominant side: Secondary | ICD-10-CM

## 2023-05-27 DIAGNOSIS — R2681 Unsteadiness on feet: Secondary | ICD-10-CM

## 2023-05-27 NOTE — Therapy (Signed)
OUTPATIENT OCCUPATIONAL THERAPY NEURO TREATMENT  Patient Name: Melanie Madden MRN: [id number] DOB:07/26/1966, 57 y.o., female Today's Date: 05/27/2023  PCP: Sigmund Hazel, MD REFERRING PROVIDER: Cathren Harsh, MD  END OF SESSION:  OT End of Session - 05/27/23 1320     Visit Number 2    Number of Visits 6    Authorization Type [employer] 2024 $50 copay per day; VL; 60 (combined PT/OT/ST) multi D/same day visits = 1 visit Auth Reqd/ Med Review @ 5th visit    Authorization - Number of Visits 5    OT Start Time 1320    OT Stop Time 1400    OT Time Calculation (min) 40 min    Activity Tolerance Patient tolerated treatment well    Behavior During Therapy WFL for tasks assessed/performed             Past Medical History:  Diagnosis Date   Allergy    Anxiety    Cancer of appendix (HCC) Stage 4    Depression    Expressive aphasia    Hemiparesis (HCC)    Right side   High triglycerides    History of kidney stones    Hypertension    Kidney stones    Recurrent sinus infections    Septic shock (HCC) 03/26/2023   Septic shock (HCC) 02/08/2021   Stroke (HCC) 07/09/2019   Past Surgical History:  Procedure Laterality Date   ANTERIOR CRUCIATE LIGAMENT REPAIR Left    BUNIONECTOMY Right    CYSTOSCOPY/URETEROSCOPY/HOLMIUM LASER/STENT PLACEMENT Right 02/08/2021   Procedure: CYSTOSCOPY/RETROGRADE/URETEROSCOPY/HOLMIUM LASER/STENT PLACEMENT, NEPHROSTOMY TUBE REMOVAL;  Surgeon: Rene Paci, MD;  Location: [facility];  Service: Urology;  Laterality: Right;   CYSTOSCOPY/URETEROSCOPY/HOLMIUM LASER/STENT PLACEMENT Bilateral 04/10/2023   Procedure: CYSTOSCOPY, BILATERAL URETEROSCOPY, BILATERAL RETROGRADE PYELOGRAM, HOLMIUM LASER LITHOTRIPSY, AND BILATERAL URETERAL STENT PLACEMENT, removal of percutanous stent post op;  Surgeon: Rene Paci, MD;  Location: [facility];  Service: Urology;  Laterality: Bilateral;  90 MINUTES   IR NEPHROSTOMY  PLACEMENT RIGHT  12/09/2020   IR NEPHROSTOMY PLACEMENT RIGHT  03/27/2023   LASIK  2008   LIPOSUCTION     LITHOTRIPSY     MENISCUS REPAIR Left    NASAL SEPTUM SURGERY     ROBOTIC ASSISTED TOTAL HYSTERECTOMY WITH BILATERAL SALPINGO OOPHERECTOMY Bilateral 12/06/2021   Procedure: XI ROBOTIC ASSISTED BILATERAL SALPINGO OOPHORECTOMY, TOTAL HYSTERECTOMY, PERITONEAL BIOPSY, OMENTECTOMY, LAPAROTOMY, APPENDECTOMY, PAP SMEAR;  Surgeon: Carver Fila, MD;  Location: [facility];  Service: Gynecology;  Laterality: Bilateral;   SHOULDER SURGERY Right    VARICOSE VEIN SURGERY Left    Patient Active Problem List   Diagnosis Date Noted   Sepsis secondary to UTI (HCC) 03/29/2023   Obstruction of right ureteropelvic junction (UPJ) due to stone 03/29/2023   Hydronephrosis 03/29/2023   Bacteremia 03/29/2023   Stroke (cerebrum) (HCC) 03/29/2023   Ovarian mass 12/06/2021   Pelvic mass in female 11/06/2021   Urinary retention    E. 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   Hypernatremia    Leukocytosis    Essential hypertension    Global aphasia    Cytotoxic brain edema (HCC) 07/10/2019   ICH (intracerebral hemorrhage) (HCC) 07/09/2019   Anxiety state 02/21/2015   Cephalalgia 12/21/2014    ONSET DATE: 04/01/2023  REFERRING DIAG: Hemiplegia and hemiparesis following nontraumatic intracerebral hemorrhage affecting right dominant side (HCC)  THERAPY DIAG:  Flaccid hemiplegia of right dominant side as  late effect of other nontraumatic intracranial hemorrhage (HCC)  Subluxation of right shoulder joint, subsequent encounter  Rationale for Evaluation and Treatment: Rehabilitation  SUBJECTIVE:   SUBJECTIVE STATEMENT: Pt shakes head no when asked if in pain  Pt accompanied by:  Caregiver - Lisa   PERTINENT HISTORY: 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 coli UTI with obstructing right renal stone, appendiceal adenocarcinoma stage IV, diagnosed on TAH/BSO in 11/2021  Patient hospitalized due to urosepsis from an obstructing 15 mm right UPJ stone. She underwent right-sided nephrostomy tube on 03/27/2023   PRECAUTIONS: None  WEIGHT BEARING RESTRICTIONS: No  PAIN:  Are you having pain? No  FALLS: Has patient fallen in last 6 months? No  Lives with: lives with their spouse; caregiver (CNA) present everyday 5am until her husband gets home Lives in: House/apartment Stairs: Yes: Internal: 16 steps; can reach both and External: 2 steps; none Has following equipment at home: Quad cane small base, Environmental consultant - 4 wheeled, Wheelchair (manual), Shower bench, and chair lift  PLOF: Independent - Prior to 2021  PATIENT GOALS: Points to R side when asked.  OBJECTIVE:  Note: Objective measures were completed at Evaluation unless otherwise noted.  HAND DOMINANCE: Ambidextrous - previously.  ADLs: Overall ADLs: Pt indicates she does it herself and caregiver confirms this by reports of husband also. Transfers/ambulation related to ADLs: Eating: Ind with L hand Grooming: Help with hair as needed UB Dressing: Pt reports she dresses herself - doesn't do buttons  LB Dressing: Dons socks, shoes and brace Toileting: Ind Bathing: Pt reports showering herself and transferring on her own Tub Shower transfers: Mod I with shower bench Equipment: Transfer tub bench  IADLs: Shopping: Spouse or caregivers Light housekeeping:  Caregivers Meal Prep: Caregivers Community mobility: Supervised with quad cane Medication management: Husband Financial management: Husband Handwriting:  NT  MOBILITY STATUS: Independent and difficulty carrying objections with ambulation  POSTURE COMMENTS:  forward head and Flaccid RUE Sitting balance: No   ACTIVITY TOLERANCE: Activity tolerance: Fair, reliant on quad cane for all mobility, slow pace  FUNCTIONAL OUTCOME MEASURES: NT  UPPER EXTREMITY ROM:    ROM Right Eval - PROM Left Eval -  AROM  Shoulder flexion 85 WNL   Shoulder abduction 75   Shoulder adduction    Shoulder extension    Shoulder internal rotation    Shoulder external rotation    Elbow flexion WNL   Elbow extension WNL   Wrist flexion WNL   Wrist extension WNL   Wrist ulnar deviation    Wrist radial deviation    Wrist pronation    Wrist supination    (Blank rows = not tested)  UPPER EXTREMITY MMT:     MMT Right eval Left eval  Shoulder flexion Generally 0 to 1(flickers) mostly with associate reactions (yawning - elbow flexes) and slight finger flexion/grip 4  Shoulder abduction    Shoulder adduction    Shoulder extension    Shoulder internal rotation  Shoulder external rotation    Middle trapezius    Lower trapezius    Elbow flexion    Elbow extension    Wrist flexion    Wrist extension    Wrist ulnar deviation    Wrist radial deviation    Wrist pronation    Wrist supination    (Blank rows = not tested)  HAND FUNCTION: L - WFL grip 57.9, 47.3, 47.6 - Average R - 50.9 lbs R - NA  COORDINATION: L - WFL  R - NA  SENSATION: R side feels "different"  EDEMA: Pt reports NA  MUSCLE TONE: RUE: Flaccid  COGNITION: Overall cognitive status: Difficulty to assess due to: Communication impairment  VISION: Subjective report: Pt preports no difficulty Baseline vision: No visual deficits Visual history: NA  VISION ASSESSMENT: Not tested  Patient has difficulty with  following activities due to following visual impairments: None reported  PERCEPTION: Not tested  PRAXIS: Not tested  OBSERVATIONS: Pt ambulates with quad cane with no loss of balance but some difficulty following directions to step up to the table so OTR could bring the chair behind her to sit.  She has very flaccid R UE that simple hangs during ambulation and has a significant subluxation.  Pt has aphasia but is able to articulate a few individual words - yes/no etc.  Pt needs extra time for sit to stand with some stability of chair to get up.     TODAY'S TREATMENT:                                                                                                                                Gentle stretch to Rt hand prior to wt bearing through RUE over edge of mat while seated. Therapist provided support to hand to keep from sliding while performing wt bearing activities over Rt elbow, followed by body on arm movements and cross body reaching LUE AA/ROM RUE with max facilitation into gravity and gravity elim planes  Began fabrication and fitting of resting hand splint for Rt hand to prevent composite flexion, improve skin integrity in palm and for greater ease with hygiene care. To finish and issue next session  PATIENT EDUCATION: Education details: OT role and POC considerations with options ie) Neurorehab glove, estim tx Person educated: Patient and Caregiver Armed forces logistics/support/administrative officer method: Explanation, Verbal cues, and Handouts Education comprehension: verbalized understanding and needs further education  HOME EXERCISE PROGRAM: TBD   GOALS: Goals reviewed with patient? Yes  SHORT TERM GOALS: Target date: 06/07/23  Patient/caregiver will demonstrate initial RUE PROM/AAROM HEP with 25% verbal cues or less for proper execution. Baseline: New to outpt OT - flaccid RUE Goal status: INITIAL  2.  Patient and family will be aware of home based options for HEP (Neuro Rehab glove, estim, kinesio  taping). Baseline: New to outpt OT - flaccid RUE Goal status: IN Progress   LONG TERM GOALS: Target date: 07/05/23  Patient/caregiver will  demonstrate updated RUE HEP with handouts only for proper execution. Baseline: New to outpt OT - flaccid RUE Goal status: INITIAL  2.  Caregivers will be able to apply kinesio tape for L shoulder subluxation support 1-2x/week. Baseline: New to outpt OT - flaccid RUE Goal status: INITIAL  3.  Caregivers will be independent with neuro re-ed activities for R UE stimulation and AAROM as deemed available and appropriate by family for home use (ie. Neuro Rehab glove and/or estim) Baseline: New to outpt OT - flaccid RUE Goal status: INITIAL  4.  Caregivers will be able to assist with splint application as needed/tolerated by pt for R thumb ROM and sensory stimulation of R hand and shoulder subluxation as tolerated. Baseline: New to outpt OT - flaccid RUE but some thumb flexion tightness; shoulder subluxation > thumb width Goal status: INITIAL  ASSESSMENT:  CLINICAL IMPRESSION: Patient is seen today for occupational therapytreatment for chronic CVA with R flaccid hemiplegia. 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 coli UTI with obstructing right renal stone, appendiceal adenocarcinoma stage IV diagnosed 11/2021 and recent hospitalization due to urosepsis. Patient currently presents with significant R UE non-function and would benefit from skilled OT services in the outpatient setting to work on impairments as to help pt establish HEP for carryover at home in hopes of further neuro return over time as able.     PERFORMANCE DEFICITS: in functional skills including ADLs, IADLs, coordination, dexterity, sensation, tone, ROM, strength, Fine motor control, Gross motor control, balance, endurance, decreased knowledge of precautions, decreased knowledge of use of DME, and UE functional use,  cognitive skills including memory, problem solving, safety awareness, sequencing, temperament/personality, thought, and understand, and psychosocial skills including coping strategies, environmental adaptation, and interpersonal interactions.   IMPAIRMENTS: are limiting patient from ADLs, IADLs, leisure, and social participation.   CO-MORBIDITIES: may have co-morbidities  that affects occupational performance. Patient will benefit from skilled OT to address above impairments and improve overall function.  MODIFICATION OR ASSISTANCE TO COMPLETE EVALUATION: No modification of tasks or assist necessary to complete an evaluation.  OT OCCUPATIONAL PROFILE AND HISTORY: Problem focused assessment: Including review of records relating to presenting problem.  CLINICAL DECISION MAKING: LOW - limited treatment options, no task modification necessary  REHAB POTENTIAL: Fair due to chronicity of CVA  EVALUATION COMPLEXITY: Low    PLAN:  OT FREQUENCY: 1x/week  OT DURATION: 6 weeks  PLANNED INTERVENTIONS: 97535 self care/ADL training, 19147 therapeutic exercise, 97530 therapeutic activity, 97112 neuromuscular re-education, 97140 manual therapy, [id number] electrical stimulation (manual), 97014 electrical stimulation unattended, 97760 Orthotics management and training, 82956 Splinting (initial encounter), [id number] Subsequent splinting/medication, passive range of motion, coping strategies training, patient/family education, and DME and/or AE instructions  RECOMMENDED OTHER SERVICES: Pt has PT services in place at this time.  CONSULTED AND AGREED WITH PLAN OF CARE: Patient and Caregiver  PLAN FOR NEXT SESSION:  Finish resting hand splint HEP initiation Neuro Glove considerations Estim trials  Sheran Lawless, OT 05/27/2023, 1:20 PM

## 2023-05-27 NOTE — Therapy (Signed)
OUTPATIENT PHYSICAL THERAPY NEURO TREATMENT   Patient Name: Melanie Madden MRN: [id number] DOB:Jan 14, 1966, 57 y.o., female Today's Date: 05/27/2023   PCP: Sigmund Hazel, MD REFERRING PROVIDER: Cathren Harsh, MD  END OF SESSION:  PT End of Session - 05/27/23 1437     Visit Number 5    Number of Visits 9    Date for PT Re-Evaluation 06/28/23    Authorization Type [employer]    PT Start Time 1400    PT Stop Time 1442    PT Time Calculation (min) 42 min    Activity Tolerance Patient tolerated treatment well    Behavior During Therapy Agitated;WFL for tasks assessed/performed              Past Medical History:  Diagnosis Date   Allergy    Anxiety    Cancer of appendix (HCC) Stage 4    Depression    Expressive aphasia    Hemiparesis (HCC)    Right side   High triglycerides    History of kidney stones    Hypertension    Kidney stones    Recurrent sinus infections    Septic shock (HCC) 03/26/2023   Septic shock (HCC) 02/08/2021   Stroke (HCC) 07/09/2019   Past Surgical History:  Procedure Laterality Date   ANTERIOR CRUCIATE LIGAMENT REPAIR Left    BUNIONECTOMY Right    CYSTOSCOPY/URETEROSCOPY/HOLMIUM LASER/STENT PLACEMENT Right 02/08/2021   Procedure: CYSTOSCOPY/RETROGRADE/URETEROSCOPY/HOLMIUM LASER/STENT PLACEMENT, NEPHROSTOMY TUBE REMOVAL;  Surgeon: Rene Paci, MD;  Location: [facility];  Service: Urology;  Laterality: Right;   CYSTOSCOPY/URETEROSCOPY/HOLMIUM LASER/STENT PLACEMENT Bilateral 04/10/2023   Procedure: CYSTOSCOPY, BILATERAL URETEROSCOPY, BILATERAL RETROGRADE PYELOGRAM, HOLMIUM LASER LITHOTRIPSY, AND BILATERAL URETERAL STENT PLACEMENT, removal of percutanous stent post op;  Surgeon: Rene Paci, MD;  Location: [facility];  Service: Urology;  Laterality: Bilateral;  90 MINUTES   IR NEPHROSTOMY PLACEMENT RIGHT  12/09/2020   IR NEPHROSTOMY PLACEMENT RIGHT  03/27/2023   LASIK  2008   LIPOSUCTION      LITHOTRIPSY     MENISCUS REPAIR Left    NASAL SEPTUM SURGERY     ROBOTIC ASSISTED TOTAL HYSTERECTOMY WITH BILATERAL SALPINGO OOPHERECTOMY Bilateral 12/06/2021   Procedure: XI ROBOTIC ASSISTED BILATERAL SALPINGO OOPHORECTOMY, TOTAL HYSTERECTOMY, PERITONEAL BIOPSY, OMENTECTOMY, LAPAROTOMY, APPENDECTOMY, PAP SMEAR;  Surgeon: Carver Fila, MD;  Location: [facility];  Service: Gynecology;  Laterality: Bilateral;   SHOULDER SURGERY Right    VARICOSE VEIN SURGERY Left    Patient Active Problem List   Diagnosis Date Noted   Sepsis secondary to UTI (HCC) 03/29/2023   Obstruction of right ureteropelvic junction (UPJ) due to stone 03/29/2023   Hydronephrosis 03/29/2023   Bacteremia 03/29/2023   Stroke (cerebrum) (HCC) 03/29/2023   Ovarian mass 12/06/2021   Pelvic mass in female 11/06/2021   Urinary retention    E. 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   Hypernatremia    Leukocytosis    Essential hypertension  Global aphasia    Cytotoxic brain edema (HCC) 07/10/2019   ICH (intracerebral hemorrhage) (HCC) 07/09/2019   Anxiety state 02/21/2015   Cephalalgia 12/21/2014    ONSET DATE: 2020  REFERRING DIAG: D66.44 (ICD-10-CM) - Status post CVA  THERAPY DIAG:  Muscle weakness (generalized)  Other abnormalities of gait and mobility  Other lack of coordination  Unsteadiness on feet  Abnormal posture  Rationale for Evaluation and Treatment: Rehabilitation  SUBJECTIVE:                                                                                                                                                                                              SUBJECTIVE STATEMENT: Patient arrives to clinic alone. Denies falls. Per front desk, husband requesting phone call to discuss braces to use at night to prevent ankle inversion when using restroom.   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 coli UTI with obstructing right renal stone, appendiceal adenocarcinoma stage IV, diagnosed on TAH/BSO in 11/2021  PAIN:  Are you having pain? No  PRECAUTIONS: Fall and Other: aphasic, R hemi  PATIENT GOALS: Caregiver endorses wanting her to work on her R side.  OBJECTIVE:  Note: Objective measures were completed at Evaluation unless otherwise noted.  DIAGNOSTIC FINDINGS:   MRI Brain 07/11/2019: IMPRESSION: 1. Large intra-axial hemorrhage in the left hemisphere centered at the left lentiform nucleus with estimated 53 mL volume appears not significantly changed in size since 07/09/2019. Superimposed restricted diffusion suggesting this is hemorrhagic transformation of an ischemic infarct. Surrounding edema and regional mass effect including mildly increased rightward midline shift of 6-7 mm. No ventricular trapping.  Basilar cisterns remain patent.   2. Trace intraventricular hemorrhage. No definite extra-axial extension of blood (although see #3).   3. Small indeterminate extra-axial collection or mass in the left prepontine cistern measuring 15 x 7 x 11 mm. 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 Small volume subdural hematoma less likely. Follow-up post-contrast brain MRI would be necessary to confirm.   4. 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  No recent relevant imaging.  VITALS  Vitals:   05/27/23 1407 05/27/23  1409  BP: (!) 158/101 (!) 155/98  Pulse: (!) 110 (!) 104       TODAY'S TREATMENT:  Ther Act  Assessed vitals (see above) and BP elevated but pt motioned that this number is normal for her   Trialed braces at husbands request: -both ASO and air cast allow for inversion in extensor tone-like pattern  Recommendations: (printed and provided to patient)  Air cast and ASO (lace up brace) did NOT work (still allowed for ankle to roll)  Speak with [facility] [phone]) about the possibility of trialing other braces (if they have) before purchasing  Place bed side commode next to bed for simple stand-pivot transfer in the middle of the night  Limit fluid intake prior to bed to minimize need to use bathroom during the night  *patient not agreeable to use brace at night, despite PT recommendation to do so  PATIENT EDUCATION: Education details: see above Person educated: Patient and Engineer, maintenance (IT)   Education method: Explanation, Demonstration, Tactile cues, Verbal cues, and Handouts Education comprehension: returned demonstration, tactile cues required, and needs further education  HOME EXERCISE PROGRAM: Access Code: [email or web address] URL: [email or web address] Date: 05/20/2023 Prepared by: Alethia Berthold Plaster  Exercises - Supine Short Arc Quad  - 1 x daily - 7 x weekly - 1-2 sets - 10 reps - Supine Bridge  - 1 x daily - 7 x weekly - 3 sets - 10 reps - Seated Scapular Retraction  - 1 x daily - 7 x weekly - 3 sets - 10 reps - Sit to Stand with Armchair  - 1 x daily - 7 x weekly - 1-2 sets - 5-7 reps  GOALS: Goals reviewed with patient? Yes  SHORT TERM GOALS: Target date: 05/31/2023  Pt will decrease 5xSTS to <28.31 seconds w/ improved eccentric control in order to demonstrate decreased risk for falls and improved functional bilateral LE strength and  power. Baseline:  33.31 sec w/ LUE support and uncontrolled descent Goal status: INITIAL  2. Pt will improve gait speed to >/= .23m/s to demonstrate improved community ambulation  Baseline:  .70m/s Goal status: INITIAL  3.  Pt will improve Berg score to 26/56 for decreased fall risk  Baseline:  23/56 Goal status: REVISED  4.  Pt will be independent and compliant with initial strength and balance HEP in order to maintain functional progress and improve mobility. Baseline:  To be established. Goal status: INITIAL  LONG TERM GOALS: Target date: 06/28/2023  Pt will decrease 5xSTS to <23.31 seconds w/ improved eccentric control in order to demonstrate decreased risk for falls and improved functional bilateral LE strength and power. Baseline:  33.31 sec w/ LUE support and uncontrolled descent Goal status: INITIAL  2.  Pt will improve gait speed to >/= .69m/s to demonstrate improved community ambulation  Baseline:  .7m/s Goal status: REVISED  3.  Pt will improve Berg score to 30/56 for decreased fall risk  Baseline:  23/56 Goal status: REVISED   4.  Pt will be independent and compliant with finalized strength and balance HEP in order to maintain functional progress and improve mobility. Baseline:  To be established. Goal status: INITIAL  5.  Patient will be compliant to formal walking program >/= 3 days per week with supervision for safety to improve aerobic tolerance and ambulatory mechanics. Baseline:  To be established. Goal status: INITIAL  ASSESSMENT:  CLINICAL IMPRESSION: Patient seen for skilled PT session with emphasis on assessing LE braces to limited ankle inversion for trips to the restroom at night. Patient verbalizing that she is not agreeable to wearing a brace, nor does she want the [facility] at her bedside.  Neither the aircast nor the ASO prevented R ankle version in stance. She is note safe to ambulate without her current AFO. Continue POC as able.   OBJECTIVE  IMPAIRMENTS: Abnormal gait, decreased activity tolerance, decreased balance, decreased cognition, decreased coordination, decreased endurance, decreased knowledge of condition, decreased knowledge of use of DME, decreased mobility, difficulty walking, decreased ROM, decreased strength, decreased safety awareness, impaired perceived functional ability, impaired tone, impaired UE functional use, improper body mechanics, and postural dysfunction.   ACTIVITY LIMITATIONS: carrying, lifting, bending, sitting, standing, squatting, stairs, transfers, and locomotion level  PARTICIPATION LIMITATIONS: meal prep, cleaning, laundry, interpersonal relationship, driving, shopping, community activity, and occupation  PERSONAL FACTORS: Age, Behavior pattern, Fitness, Past/current experiences, Time since onset of injury/illness/exacerbation, and 1-2 comorbidities: hemorrhagic CVA  are also affecting patient's functional outcome.   REHAB POTENTIAL: Poor see PMH and personal factors  CLINICAL DECISION MAKING: Evolving/moderate complexity  EVALUATION COMPLEXITY: Moderate  PLAN:  PT FREQUENCY: 1x/week  PT DURATION: 8 weeks  PLANNED INTERVENTIONS: Therapeutic exercises, Therapeutic activity, Neuromuscular re-education, Balance training, Gait training, Patient/Family education, Self Care, Stair training, Vestibular training, Visual/preceptual remediation/compensation, Orthotic/Fit training, DME instructions, Electrical stimulation, Manual therapy, and Re-evaluation  PLAN FOR NEXT SESSION:  How is HEP? Gait training.  R NMR.  Walking program?  Endurance. Quad strength   Westley Foots, PT Westley Foots, PT, DPT, CBIS   05/27/2023, 3:38 PM

## 2023-06-03 ENCOUNTER — Encounter: Payer: Self-pay | Admitting: Physical Therapy

## 2023-06-03 ENCOUNTER — Ambulatory Visit: Payer: Managed Care, Other (non HMO) | Attending: Family Medicine | Admitting: Occupational Therapy

## 2023-06-03 ENCOUNTER — Ambulatory Visit: Payer: Managed Care, Other (non HMO) | Admitting: Physical Therapy

## 2023-06-03 VITALS — BP 152/102 | HR 97

## 2023-06-03 DIAGNOSIS — I69251 Hemiplegia and hemiparesis following other nontraumatic intracranial hemorrhage affecting right dominant side: Secondary | ICD-10-CM | POA: Diagnosis present

## 2023-06-03 DIAGNOSIS — I69151 Hemiplegia and hemiparesis following nontraumatic intracerebral hemorrhage affecting right dominant side: Secondary | ICD-10-CM | POA: Diagnosis present

## 2023-06-03 DIAGNOSIS — R2681 Unsteadiness on feet: Secondary | ICD-10-CM | POA: Diagnosis present

## 2023-06-03 DIAGNOSIS — S43001D Unspecified subluxation of right shoulder joint, subsequent encounter: Secondary | ICD-10-CM | POA: Diagnosis present

## 2023-06-03 DIAGNOSIS — M6281 Muscle weakness (generalized): Secondary | ICD-10-CM

## 2023-06-03 DIAGNOSIS — R2689 Other abnormalities of gait and mobility: Secondary | ICD-10-CM

## 2023-06-03 DIAGNOSIS — R278 Other lack of coordination: Secondary | ICD-10-CM | POA: Insufficient documentation

## 2023-06-03 DIAGNOSIS — R293 Abnormal posture: Secondary | ICD-10-CM | POA: Insufficient documentation

## 2023-06-03 NOTE — Therapy (Signed)
OUTPATIENT PHYSICAL THERAPY NEURO TREATMENT   Patient Name: Melanie Madden MRN: [id number] DOB:14-May-1966, 57 y.o., female Today's Date: 06/03/2023   PCP: Sigmund Hazel, MD REFERRING PROVIDER: Cathren Harsh, MD  END OF SESSION:  PT End of Session - 06/03/23 1409     Visit Number 6    Number of Visits 9    Date for PT Re-Evaluation 06/28/23    Authorization Type [employer]    PT Start Time 1405    PT Stop Time 1423    PT Time Calculation (min) 18 min    Activity Tolerance Treatment limited secondary to medical complications (Comment)   hypertension              Past Medical History:  Diagnosis Date   Allergy    Anxiety    Cancer of appendix (HCC) Stage 4    Depression    Expressive aphasia    Hemiparesis (HCC)    Right side   High triglycerides    History of kidney stones    Hypertension    Kidney stones    Recurrent sinus infections    Septic shock (HCC) 03/26/2023   Septic shock (HCC) 02/08/2021   Stroke (HCC) 07/09/2019   Past Surgical History:  Procedure Laterality Date   ANTERIOR CRUCIATE LIGAMENT REPAIR Left    BUNIONECTOMY Right    CYSTOSCOPY/URETEROSCOPY/HOLMIUM LASER/STENT PLACEMENT Right 02/08/2021   Procedure: CYSTOSCOPY/RETROGRADE/URETEROSCOPY/HOLMIUM LASER/STENT PLACEMENT, NEPHROSTOMY TUBE REMOVAL;  Surgeon: Rene Paci, MD;  Location: [facility];  Service: Urology;  Laterality: Right;   CYSTOSCOPY/URETEROSCOPY/HOLMIUM LASER/STENT PLACEMENT Bilateral 04/10/2023   Procedure: CYSTOSCOPY, BILATERAL URETEROSCOPY, BILATERAL RETROGRADE PYELOGRAM, HOLMIUM LASER LITHOTRIPSY, AND BILATERAL URETERAL STENT PLACEMENT, removal of percutanous stent post op;  Surgeon: Rene Paci, MD;  Location: [facility];  Service: Urology;  Laterality: Bilateral;  90 MINUTES   IR NEPHROSTOMY PLACEMENT RIGHT  12/09/2020   IR NEPHROSTOMY PLACEMENT RIGHT  03/27/2023   LASIK  2008   LIPOSUCTION     LITHOTRIPSY     MENISCUS  REPAIR Left    NASAL SEPTUM SURGERY     ROBOTIC ASSISTED TOTAL HYSTERECTOMY WITH BILATERAL SALPINGO OOPHERECTOMY Bilateral 12/06/2021   Procedure: XI ROBOTIC ASSISTED BILATERAL SALPINGO OOPHORECTOMY, TOTAL HYSTERECTOMY, PERITONEAL BIOPSY, OMENTECTOMY, LAPAROTOMY, APPENDECTOMY, PAP SMEAR;  Surgeon: Carver Fila, MD;  Location: [facility];  Service: Gynecology;  Laterality: Bilateral;   SHOULDER SURGERY Right    VARICOSE VEIN SURGERY Left    Patient Active Problem List   Diagnosis Date Noted   Sepsis secondary to UTI (HCC) 03/29/2023   Obstruction of right ureteropelvic junction (UPJ) due to stone 03/29/2023   Hydronephrosis 03/29/2023   Bacteremia 03/29/2023   Stroke (cerebrum) (HCC) 03/29/2023   Ovarian mass 12/06/2021   Pelvic mass in female 11/06/2021   Urinary retention    E. 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   Hypernatremia    Leukocytosis    Essential hypertension    Global aphasia  Cytotoxic brain edema (HCC) 07/10/2019   ICH (intracerebral hemorrhage) (HCC) 07/09/2019   Anxiety state 02/21/2015   Cephalalgia 12/21/2014    ONSET DATE: 2020  REFERRING DIAG: Y78.29 (ICD-10-CM) - Status post CVA  THERAPY DIAG:  Other lack of coordination  Other abnormalities of gait and mobility  Hemiplegia and hemiparesis following nontraumatic intracerebral hemorrhage affecting right dominant side (HCC)  Subluxation of right shoulder joint, subsequent encounter  Muscle  weakness (generalized)  Abnormal posture  Unsteadiness on feet  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 Denies falls.   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 coli UTI with obstructing right renal stone, appendiceal adenocarcinoma stage IV, diagnosed on TAH/BSO in 11/2021  PAIN:  Are you having pain? No  PRECAUTIONS: Fall and Other: aphasic, R hemi  PATIENT GOALS: Caregiver endorses wanting her to work on her R side.  OBJECTIVE:  Note: Objective measures were completed at Evaluation unless otherwise noted.  DIAGNOSTIC FINDINGS:   MRI Brain 07/11/2019: IMPRESSION: 1. Large intra-axial hemorrhage in the left hemisphere centered at the left lentiform nucleus with estimated 53 mL volume appears not significantly changed in size since 07/09/2019. Superimposed restricted diffusion suggesting this is hemorrhagic transformation of an ischemic infarct. Surrounding edema and regional mass effect including mildly increased rightward midline shift of 6-7 mm. No ventricular trapping.  Basilar cisterns remain patent.   2. Trace intraventricular hemorrhage. No definite extra-axial extension of blood (although see #3).   3. Small indeterminate extra-axial collection or mass in the left prepontine cistern measuring 15 x 7 x 11 mm. 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 Small volume subdural hematoma less likely. Follow-up post-contrast brain MRI would be necessary to confirm.    4. 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  No recent relevant imaging.  VITALS  Vitals:   06/03/23 1410 06/03/23 1412  BP: (!) 156/99 (!) 152/102  Pulse: 96 97        TODAY'S TREATMENT:  Ther Act  Assessed vitals (see above) and BP elevated. Pt. Motioned was not high this morning. PT inquired about BP medication. Encouraged pt. To call PCP and get refill on BP medication as a high reading can put her at risk of stroke.     PATIENT EDUCATION: Education details: see above Person educated: Patient and Designer, fashion/clothing method: Explanation, Demonstration, Tactile cues, Verbal cues, and Handouts Education comprehension: returned demonstration, tactile cues required, and needs further education  HOME EXERCISE PROGRAM: Access Code: [email or web address] URL: [email or web address] Date: 05/20/2023 Prepared by: Alethia Berthold Plaster  Exercises - Supine Short Arc Quad  - 1 x daily - 7 x weekly - 1-2 sets - 10 reps - Supine Bridge  - 1 x daily - 7 x weekly - 3 sets - 10 reps - Seated Scapular Retraction  - 1 x daily - 7 x weekly - 3 sets - 10 reps - Sit to Stand with Armchair  - 1 x daily - 7 x weekly - 1-2 sets - 5-7 reps  GOALS: Goals reviewed with patient? Yes  SHORT TERM GOALS: Target date: 05/31/2023  Pt will decrease 5xSTS to <28.31 seconds w/ improved eccentric control in order to demonstrate decreased risk for falls and improved functional bilateral LE strength and power. Baseline:  33.31 sec w/ LUE support and uncontrolled descent Goal status: INITIAL  2. Pt will improve gait speed to >/= .18m/s to demonstrate improved community ambulation  Baseline:  .47m/s Goal status: INITIAL  3.  Pt will improve Berg score to 26/56 for decreased fall risk  Baseline:  23/56 Goal status: REVISED  4.  Pt will be  independent and compliant with initial strength and balance HEP in order to maintain functional progress and improve mobility. Baseline:  To be established. Goal status: INITIAL  LONG TERM GOALS: Target date: 06/28/2023  Pt will decrease 5xSTS to <23.31 seconds w/ improved eccentric control in order to demonstrate decreased risk for falls and improved functional bilateral LE strength and power. Baseline:  33.31 sec w/ LUE support and uncontrolled descent Goal status: INITIAL  2.  Pt will improve gait speed to >/= .59m/s to demonstrate improved community ambulation  Baseline:  .62m/s Goal status: REVISED  3.  Pt will improve Berg score to 30/56 for decreased fall risk  Baseline:  23/56 Goal status: REVISED   4.  Pt will be independent and compliant with finalized strength and balance HEP in order to maintain functional progress and improve mobility. Baseline:  To be established. Goal status: INITIAL  5.  Patient will be compliant to formal walking program >/= 3 days per week with supervision for safety to improve aerobic tolerance and ambulatory mechanics. Baseline:  To be established. Goal status: INITIAL  ASSESSMENT:  CLINICAL IMPRESSION: Arrive no charge due to hypertension. Continue POC as able.   OBJECTIVE IMPAIRMENTS: Abnormal gait, decreased activity tolerance, decreased balance, decreased cognition, decreased coordination, decreased endurance, decreased knowledge of condition, decreased knowledge of use of DME, decreased mobility, difficulty walking, decreased ROM, decreased strength, decreased safety awareness, impaired perceived functional ability, impaired tone, impaired UE functional use, improper body mechanics, and postural dysfunction.   ACTIVITY LIMITATIONS: carrying, lifting, bending, sitting, standing, squatting, stairs, transfers, and locomotion level  PARTICIPATION LIMITATIONS: meal prep, cleaning, laundry, interpersonal relationship, driving, shopping,  community activity, and occupation  PERSONAL FACTORS: Age, Behavior pattern, Fitness, Past/current experiences, Time since onset of injury/illness/exacerbation, and 1-2 comorbidities: hemorrhagic CVA  are also affecting patient's functional outcome.   REHAB POTENTIAL: Poor  see PMH and personal factors  CLINICAL DECISION MAKING: Evolving/moderate complexity  EVALUATION COMPLEXITY: Moderate  PLAN:  PT FREQUENCY: 1x/week  PT DURATION: 8 weeks  PLANNED INTERVENTIONS: Therapeutic exercises, Therapeutic activity, Neuromuscular re-education, Balance training, Gait training, Patient/Family education, Self Care, Stair training, Vestibular training, Visual/preceptual remediation/compensation, Orthotic/Fit training, DME instructions, Electrical stimulation, Manual therapy, and Re-evaluation  PLAN FOR NEXT SESSION:  How is HEP? Gait training.  R NMR.  Walking program?  Endurance. Quad strength    Tamora Huneke M Emmerich Cryer, SPT 06/03/2023, 2:25 PM

## 2023-06-03 NOTE — Therapy (Unsigned)
OUTPATIENT OCCUPATIONAL THERAPY NEURO TREATMENT  Patient Name: Melanie Madden MRN: [id number] DOB:November 02, 1965, 57 y.o., female Today's Date: 06/03/2023  PCP: Sigmund Hazel, MD REFERRING PROVIDER: Cathren Harsh, MD  END OF SESSION:  OT End of Session - 06/03/23 1317     Visit Number 3    Number of Visits 6    Authorization Type [employer] 2024 $50 copay per day; VL; 60 (combined PT/OT/ST) multi D/same day visits = 1 visit Auth Reqd/ Med Review @ 5th visit    Authorization - Number of Visits 5    OT Start Time 1315    OT Stop Time 1400    OT Time Calculation (min) 45 min    Equipment Utilized During Treatment Thermoplastic splint    Activity Tolerance Patient tolerated treatment well    Behavior During Therapy WFL for tasks assessed/performed (P)    Irritable but cooperative            Past Medical History:  Diagnosis Date   Allergy    Anxiety    Cancer of appendix (HCC) Stage 4    Depression    Expressive aphasia    Hemiparesis (HCC)    Right side   High triglycerides    History of kidney stones    Hypertension    Kidney stones    Recurrent sinus infections    Septic shock (HCC) 03/26/2023   Septic shock (HCC) 02/08/2021   Stroke (HCC) 07/09/2019   Past Surgical History:  Procedure Laterality Date   ANTERIOR CRUCIATE LIGAMENT REPAIR Left    BUNIONECTOMY Right    CYSTOSCOPY/URETEROSCOPY/HOLMIUM LASER/STENT PLACEMENT Right 02/08/2021   Procedure: CYSTOSCOPY/RETROGRADE/URETEROSCOPY/HOLMIUM LASER/STENT PLACEMENT, NEPHROSTOMY TUBE REMOVAL;  Surgeon: Rene Paci, MD;  Location: [facility];  Service: Urology;  Laterality: Right;   CYSTOSCOPY/URETEROSCOPY/HOLMIUM LASER/STENT PLACEMENT Bilateral 04/10/2023   Procedure: CYSTOSCOPY, BILATERAL URETEROSCOPY, BILATERAL RETROGRADE PYELOGRAM, HOLMIUM LASER LITHOTRIPSY, AND BILATERAL URETERAL STENT PLACEMENT, removal of percutanous stent post op;  Surgeon: Rene Paci, MD;  Location: [facility];  Service: Urology;  Laterality: Bilateral;  90 MINUTES   IR NEPHROSTOMY PLACEMENT RIGHT  12/09/2020   IR NEPHROSTOMY PLACEMENT RIGHT  03/27/2023   LASIK  2008   LIPOSUCTION     LITHOTRIPSY     MENISCUS REPAIR Left    NASAL SEPTUM SURGERY     ROBOTIC ASSISTED TOTAL HYSTERECTOMY WITH BILATERAL SALPINGO OOPHERECTOMY Bilateral 12/06/2021   Procedure: XI ROBOTIC ASSISTED BILATERAL SALPINGO OOPHORECTOMY, TOTAL HYSTERECTOMY, PERITONEAL BIOPSY, OMENTECTOMY, LAPAROTOMY, APPENDECTOMY, PAP SMEAR;  Surgeon: Carver Fila, MD;  Location: [facility];  Service: Gynecology;  Laterality: Bilateral;   SHOULDER SURGERY Right    VARICOSE VEIN SURGERY Left    Patient Active Problem List   Diagnosis Date Noted   Sepsis secondary to UTI (HCC) 03/29/2023   Obstruction of right ureteropelvic junction (UPJ) due to stone 03/29/2023   Hydronephrosis 03/29/2023   Bacteremia 03/29/2023   Stroke (cerebrum) (HCC) 03/29/2023   Ovarian mass 12/06/2021   Pelvic mass in female 11/06/2021   Urinary retention    E. 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   Hypernatremia    Leukocytosis    Essential hypertension    Global aphasia    Cytotoxic brain edema (HCC) 07/10/2019   ICH (intracerebral hemorrhage) (HCC) 07/09/2019   Anxiety state 02/21/2015   Cephalalgia 12/21/2014    ONSET DATE: 04/01/2023  REFERRING DIAG: Hemiplegia and hemiparesis following nontraumatic intracerebral hemorrhage affecting  right dominant side (HCC)  THERAPY DIAG:  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 coli UTI with obstructing right renal stone, appendiceal adenocarcinoma stage IV, diagnosed on TAH/BSO in 11/2021  Patient hospitalized due to urosepsis from an obstructing 15 mm right UPJ stone. She underwent right-sided nephrostomy tube on 03/27/2023   PRECAUTIONS: None  WEIGHT BEARING RESTRICTIONS: No  PAIN:  Are you having pain? No  FALLS: Has patient fallen in last 6 months? No  Lives with: lives with their spouse; caregiver (CNA) present everyday 5am until her husband gets home Lives in: House/apartment Stairs: Yes: Internal: 16 steps; can reach both and External: 2 steps; none Has following equipment at home: Quad cane small base, Environmental consultant - 4 wheeled, Wheelchair (manual), Shower bench, and chair lift  PLOF: Independent - Prior to 2021  PATIENT GOALS: Points to R side when asked.  OBJECTIVE:  Note: Objective measures were completed at Evaluation unless otherwise noted.  HAND DOMINANCE: Ambidextrous - previously.  ADLs: Overall ADLs: Pt indicates she does it herself and caregiver confirms this by reports of husband also. Transfers/ambulation related to ADLs: Eating: Ind with L hand Grooming: Help with hair as needed UB Dressing: Pt reports she dresses herself - doesn't do buttons  LB Dressing: Dons socks, shoes and brace Toileting:  Ind Bathing: Pt reports showering herself and transferring on her own Tub Shower transfers: Mod I with shower bench Equipment: Transfer tub bench  IADLs: Shopping: Spouse or caregivers Light housekeeping: Caregivers Meal Prep: Caregivers Community mobility: Supervised with quad cane Medication management: Husband Financial management: Husband Handwriting:  NT  MOBILITY STATUS: Independent and difficulty carrying objections with ambulation  POSTURE COMMENTS:  forward head and Flaccid RUE Sitting balance: No   ACTIVITY TOLERANCE: Activity tolerance: Fair, reliant on quad cane for all mobility, slow pace  FUNCTIONAL OUTCOME MEASURES: NT  UPPER EXTREMITY ROM:    ROM Right Eval - PROM Left Eval -  AROM  Shoulder flexion 85 WNL   Shoulder abduction 75   Shoulder adduction    Shoulder extension    Shoulder internal rotation    Shoulder external rotation    Elbow flexion WNL   Elbow extension WNL   Wrist flexion WNL   Wrist extension WNL   Wrist ulnar deviation    Wrist radial deviation    Wrist pronation    Wrist supination    (Blank rows = not tested)  UPPER EXTREMITY MMT:     MMT Right eval Left eval  Shoulder flexion Generally 0 to 1(flickers) mostly with associate reactions (yawning -  elbow flexes) and slight finger flexion/grip 4  Shoulder abduction    Shoulder adduction    Shoulder extension    Shoulder internal rotation    Shoulder external rotation    Middle trapezius    Lower trapezius    Elbow flexion    Elbow extension    Wrist flexion    Wrist extension    Wrist ulnar deviation    Wrist radial deviation    Wrist pronation    Wrist supination    (Blank rows = not tested)  HAND FUNCTION: L - WFL grip 57.9, 47.3, 47.6 - Average R - 50.9 lbs R - NA  COORDINATION: L - WFL  R - NA  SENSATION: R side feels "different"  EDEMA: Pt reports NA  MUSCLE TONE: RUE: Flaccid  COGNITION: Overall cognitive status: Difficulty to assess due to:  Communication impairment  VISION: Subjective report: Pt preports no difficulty Baseline vision: No visual deficits Visual history: NA  VISION ASSESSMENT: Not tested  Patient has difficulty with following activities due to following visual impairments: None reported  PERCEPTION: Not tested  PRAXIS: Not tested  OBSERVATIONS: Pt ambulates with quad cane with no loss of balance but some difficulty following directions to step up to the table so OTR could bring the chair behind her to sit.  She has very flaccid R UE that simple hangs during ambulation and has a significant subluxation.  Pt has aphasia but is able to articulate a few individual words - yes/no etc.  Pt needs extra time for sit to stand with some stability of chair to get up.     TODAY'S TREATMENT:                                                                                                                                Completed modification of previously fabricated thermoplastic resting hand splint for Rt hand to prevent composite flexion, improve skin integrity in palm and for greater ease with hygiene care. OTR able to trim edges of splint, straighten finger pan and apply straps for improved fit and good stretch to fingers and thumb.  Stockingnette applied to forearm and pt instructions provided to pt and caregiver in printed format.    Neuromuscular reeducation provided, including specific visual, tactile and verbal cues and physical assistance to stimulate the neuromuscular system and promote functional ROM, movement and use of R UE for activities such as gravity eliminated elbow flexion/extension, shoulder flexion/abduction with education for use of gravity and gravity eliminated planes  Continued gentle stretch to Rt hand prior to splint application.   PATIENT EDUCATION: Education details: Splint education Person educated: Patient and Engineer, maintenance (IT) Education method: Explanation, Verbal cues, and Handouts Education  comprehension: verbalized understanding and needs further education  HOME EXERCISE PROGRAM: 06/03/23 - splint recommendations   GOALS: Goals reviewed with patient? Yes  SHORT TERM GOALS: Target date: 06/07/23  Patient/caregiver will demonstrate initial RUE PROM/AAROM HEP with 25%  verbal cues or less for proper execution. Baseline: New to outpt OT - flaccid RUE Goal status: IN progress  2.  Patient and family will be aware of home based options for HEP (Neuro Rehab glove, estim, kinesio taping). Baseline: New to outpt OT - flaccid RUE Goal status: IN Progress   LONG TERM GOALS: Target date: 07/05/23  Patient/caregiver will demonstrate updated RUE HEP with handouts only for proper execution. Baseline: New to outpt OT - flaccid RUE Goal status: INITIAL  2.  Caregivers will be able to apply kinesio tape for L shoulder subluxation support 1-2x/week. Baseline: New to outpt OT - flaccid RUE Goal status: INITIAL  3.  Caregivers will be independent with neuro re-ed activities for R UE stimulation and AAROM as deemed available and appropriate by family for home use (ie. Neuro Rehab glove and/or estim) Baseline: New to outpt OT - flaccid RUE Goal status: IN progress  4.  Caregivers will be able to assist with splint application as needed/tolerated by pt for R thumb ROM and sensory stimulation of R hand and shoulder subluxation as tolerated. Baseline: New to outpt OT - flaccid RUE but some thumb flexion tightness; shoulder subluxation > thumb width Goal status: IN progress  ASSESSMENT:  CLINICAL IMPRESSION: Patient is seen today for occupational therapy treatment for chronic CVA with R flaccid hemiplegia.  Patient tolerated splint fabrication/fitting well today.  She still has significant R UE non-function and needs printed HEP.  She also received a neuro glove and is encouraged to bring it to therapy next time for training.  She will benefit from continued skilled OT services in the  outpatient setting to work on impairments as to help pt establish HEP for carryover at home in hopes of further neuro return over time as able.     PERFORMANCE DEFICITS: in functional skills including ADLs, IADLs, coordination, dexterity, sensation, tone, ROM, strength, Fine motor control, Gross motor control, balance, endurance, decreased knowledge of precautions, decreased knowledge of use of DME, and UE functional use, cognitive skills including memory, problem solving, safety awareness, sequencing, temperament/personality, thought, and understand, and psychosocial skills including coping strategies, environmental adaptation, and interpersonal interactions.   IMPAIRMENTS: are limiting patient from ADLs, IADLs, leisure, and social participation.   CO-MORBIDITIES: may have co-morbidities  that affects occupational performance. Patient will benefit from skilled OT to address above impairments and improve overall function.  REHAB POTENTIAL: Fair due to chronicity of CVA   PLAN:  OT FREQUENCY: 1x/week  OT DURATION: 6 weeks  PLANNED INTERVENTIONS: 97535 self care/ADL training, 40981 therapeutic exercise, 97530 therapeutic activity, 97112 neuromuscular re-education, 97140 manual therapy, [id number] electrical stimulation (manual), 97014 electrical stimulation unattended, 97760 Orthotics management and training, 19147 Splinting (initial encounter), [id number] Subsequent splinting/medication, passive range of motion, coping strategies training, patient/family education, and DME and/or AE instructions  RECOMMENDED OTHER SERVICES: Pt has PT services in place at this time.  CONSULTED AND AGREED WITH PLAN OF CARE: Patient and Caregiver  PLAN FOR NEXT SESSION:  Pt to bring Neuro Glove for fitting and training  Check resting hand splint Needs printed HEP ideas for AAROM Estim trials  Victorino Sparrow, OT 06/03/2023, 2:12 PM

## 2023-06-03 NOTE — Patient Instructions (Signed)
Your Splint This splint should initially be fitted by a healthcare practitioner.  The healthcare practitioner is responsible for providing wearing instructions and precautions to the patient, other healthcare practitioners and care provider involved in the patient's care.  This splint was custom made for you. Please read the following instructions to learn about wearing and caring for your splint.   Precautions Should your splint cause any of the following problems, remove the splint immediately and contact your therapist/physician. Swelling Severe Pain Pressure Areas Stiffness Numbness   Do not wear your splint while operating machinery unless it has been fabricated for that purpose.   When To Wear Your Splint Where your splint according to your therapist/physician instructions. Nighttime (Begin by building up tolerance over next 2-3 days before wearing all night)    Care and Cleaning of Your Splint Keep your splint away from open flames. Your splint will lose its shape in temperatures over 135 degrees Farenheit, ( in car windows, near radiators, ovens or in hot water).  Never make any adjustments to your splint, if the splint needs adjusting remove it and make an appointment to see your therapist. Your splint may be cleaned with rubbing alcohol.  Do not immerse in hot water over 135 degrees Farenheit.

## 2023-06-10 ENCOUNTER — Ambulatory Visit: Payer: Managed Care, Other (non HMO) | Admitting: Occupational Therapy

## 2023-06-10 ENCOUNTER — Ambulatory Visit: Payer: Managed Care, Other (non HMO)

## 2023-06-10 VITALS — BP 172/108 | HR 121

## 2023-06-10 DIAGNOSIS — R293 Abnormal posture: Secondary | ICD-10-CM

## 2023-06-10 DIAGNOSIS — R278 Other lack of coordination: Secondary | ICD-10-CM

## 2023-06-10 DIAGNOSIS — M6281 Muscle weakness (generalized): Secondary | ICD-10-CM

## 2023-06-10 DIAGNOSIS — R2689 Other abnormalities of gait and mobility: Secondary | ICD-10-CM

## 2023-06-10 DIAGNOSIS — R2681 Unsteadiness on feet: Secondary | ICD-10-CM

## 2023-06-10 NOTE — Therapy (Signed)
OUTPATIENT PHYSICAL THERAPY NEURO TREATMENT- ARRIVED NO CHARGE   Patient Name: Melanie Madden MRN: [id number] DOB:May 25, 1966, 57 y.o., female Today's Date: 06/10/2023   PCP: Sigmund Hazel, MD REFERRING PROVIDER: Cathren Harsh, MD  END OF SESSION:  PT End of Session - 06/10/23 1321     Visit Number 7    Number of Visits 9    Date for PT Re-Evaluation 06/28/23    Authorization Type [employer]    PT Start Time 1318    PT Stop Time 1338   arrived no charge   PT Time Calculation (min) 20 min    Activity Tolerance Treatment limited secondary to medical complications (Comment)    Behavior During Therapy Agitated;WFL for tasks assessed/performed               Past Medical History:  Diagnosis Date   Allergy    Anxiety    Cancer of appendix (HCC) Stage 4    Depression    Expressive aphasia    Hemiparesis (HCC)    Right side   High triglycerides    History of kidney stones    Hypertension    Kidney stones    Recurrent sinus infections    Septic shock (HCC) 03/26/2023   Septic shock (HCC) 02/08/2021   Stroke (HCC) 07/09/2019   Past Surgical History:  Procedure Laterality Date   ANTERIOR CRUCIATE LIGAMENT REPAIR Left    BUNIONECTOMY Right    CYSTOSCOPY/URETEROSCOPY/HOLMIUM LASER/STENT PLACEMENT Right 02/08/2021   Procedure: CYSTOSCOPY/RETROGRADE/URETEROSCOPY/HOLMIUM LASER/STENT PLACEMENT, NEPHROSTOMY TUBE REMOVAL;  Surgeon: Rene Paci, MD;  Location: [facility];  Service: Urology;  Laterality: Right;   CYSTOSCOPY/URETEROSCOPY/HOLMIUM LASER/STENT PLACEMENT Bilateral 04/10/2023   Procedure: CYSTOSCOPY, BILATERAL URETEROSCOPY, BILATERAL RETROGRADE PYELOGRAM, HOLMIUM LASER LITHOTRIPSY, AND BILATERAL URETERAL STENT PLACEMENT, removal of percutanous stent post op;  Surgeon: Rene Paci, MD;  Location: [facility];  Service: Urology;  Laterality: Bilateral;  90 MINUTES   IR NEPHROSTOMY PLACEMENT RIGHT  12/09/2020   IR  NEPHROSTOMY PLACEMENT RIGHT  03/27/2023   LASIK  2008   LIPOSUCTION     LITHOTRIPSY     MENISCUS REPAIR Left    NASAL SEPTUM SURGERY     ROBOTIC ASSISTED TOTAL HYSTERECTOMY WITH BILATERAL SALPINGO OOPHERECTOMY Bilateral 12/06/2021   Procedure: XI ROBOTIC ASSISTED BILATERAL SALPINGO OOPHORECTOMY, TOTAL HYSTERECTOMY, PERITONEAL BIOPSY, OMENTECTOMY, LAPAROTOMY, APPENDECTOMY, PAP SMEAR;  Surgeon: Carver Fila, MD;  Location: [facility];  Service: Gynecology;  Laterality: Bilateral;   SHOULDER SURGERY Right    VARICOSE VEIN SURGERY Left    Patient Active Problem List   Diagnosis Date Noted   Sepsis secondary to UTI (HCC) 03/29/2023   Obstruction of right ureteropelvic junction (UPJ) due to stone 03/29/2023   Hydronephrosis 03/29/2023   Bacteremia 03/29/2023   Stroke (cerebrum) (HCC) 03/29/2023   Ovarian mass 12/06/2021   Pelvic mass in female 11/06/2021   Urinary retention    E. 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  Hypernatremia    Leukocytosis    Essential hypertension    Global aphasia    Cytotoxic brain edema (HCC) 07/10/2019   ICH (intracerebral hemorrhage) (HCC) 07/09/2019   Anxiety state 02/21/2015   Cephalalgia 12/21/2014    ONSET DATE: 2020  REFERRING DIAG: M57.84 (ICD-10-CM) - Status post CVA  THERAPY DIAG:  Muscle weakness (generalized)  Other lack of coordination  Other abnormalities of gait and mobility  Abnormal posture  Unsteadiness on  feet  Rationale for Evaluation and Treatment: Rehabilitation  SUBJECTIVE:                                                                                                                                                                                             SUBJECTIVE STATEMENT: Patient arrives to clinic with caregiver and quad cane. Denies falls.   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 coli UTI with obstructing right renal stone, appendiceal adenocarcinoma stage IV, diagnosed on TAH/BSO in 11/2021  PAIN:  Are you having pain? No  PRECAUTIONS: Fall and Other: aphasic, R hemi  PATIENT GOALS: Caregiver endorses wanting her to work on her R side.  OBJECTIVE:  Note: Objective measures were completed at Evaluation unless otherwise noted.  DIAGNOSTIC FINDINGS:   MRI Brain 07/11/2019: IMPRESSION: 1. Large intra-axial hemorrhage in the left hemisphere centered at the left lentiform nucleus with estimated 53 mL volume appears not significantly changed in size since 07/09/2019. Superimposed restricted diffusion suggesting this is hemorrhagic transformation of an ischemic infarct. Surrounding edema and regional mass effect including mildly increased rightward midline shift of 6-7 mm. No ventricular trapping.  Basilar cisterns remain patent.   2. Trace intraventricular hemorrhage. No definite extra-axial extension of blood (although see #3).   3. Small indeterminate extra-axial collection or mass in the left prepontine cistern measuring 15 x 7 x 11 mm. 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 Small volume subdural hematoma less likely. Follow-up post-contrast brain MRI would be necessary to confirm.   4. 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  No recent relevant imaging.  VITALS  Vitals:   06/10/23 1326 06/10/23 1332  BP: (!)  152/109 (!) 172/108  Pulse: (!) 121         TODAY'S TREATMENT:                 Arrived no charge due to elevated BP    PATIENT EDUCATION: Education details: see above Person educated: Patient and Engineer, maintenance (IT)   Education method: Explanation, Demonstration, Tactile cues, Verbal  cues, and Handouts Education comprehension: returned demonstration, tactile cues required, and needs further education  HOME EXERCISE PROGRAM: Access Code: [email or web address] URL: [email or web address] Date: 05/20/2023 Prepared by: Alethia Berthold Plaster  Exercises - Supine Short Arc Quad  - 1 x daily - 7 x weekly - 1-2 sets - 10 reps - Supine Bridge  - 1 x daily - 7 x weekly - 3 sets - 10 reps - Seated Scapular Retraction  - 1 x daily - 7 x weekly - 3 sets - 10 reps - Sit to Stand with Armchair  - 1 x daily - 7 x weekly - 1-2 sets - 5-7 reps  GOALS: Goals reviewed with patient? Yes  SHORT TERM GOALS: Target date: 05/31/2023  Pt will decrease 5xSTS to <28.31 seconds w/ improved eccentric control in order to demonstrate decreased risk for falls and improved functional bilateral LE strength and power. Baseline:  33.31 sec w/ LUE support and uncontrolled descent Goal status: INITIAL  2. Pt will improve gait speed to >/= .61m/s to demonstrate improved community ambulation  Baseline:  .37m/s Goal status: INITIAL  3.  Pt will improve Berg score to 26/56 for decreased fall risk  Baseline:  23/56 Goal status: REVISED  4.  Pt will be independent and compliant with initial strength and balance HEP in order to maintain functional progress and improve mobility. Baseline:  To be established. Goal status: INITIAL  LONG TERM GOALS: Target date: 06/28/2023  Pt will decrease 5xSTS to <23.31 seconds w/ improved eccentric control in order to demonstrate decreased risk for falls and improved functional bilateral LE strength and power. Baseline:  33.31 sec w/ LUE support and uncontrolled descent Goal status:  INITIAL  2.  Pt will improve gait speed to >/= .14m/s to demonstrate improved community ambulation  Baseline:  .25m/s Goal status: REVISED  3.  Pt will improve Berg score to 30/56 for decreased fall risk  Baseline:  23/56 Goal status: REVISED   4.  Pt will be independent and compliant with finalized strength and balance HEP in order to maintain functional progress and improve mobility. Baseline:  To be established. Goal status: INITIAL  5.  Patient will be compliant to formal walking program >/= 3 days per week with supervision for safety to improve aerobic tolerance and ambulatory mechanics. Baseline:  To be established. Goal status: INITIAL  ASSESSMENT:  CLINICAL IMPRESSION: Arrive no charge due to hypertension. Continue POC as able.   OBJECTIVE IMPAIRMENTS: Abnormal gait, decreased activity tolerance, decreased balance, decreased cognition, decreased coordination, decreased endurance, decreased knowledge of condition, decreased knowledge of use of DME, decreased mobility, difficulty walking, decreased ROM, decreased strength, decreased safety awareness, impaired perceived functional ability, impaired tone, impaired UE functional use, improper body mechanics, and postural dysfunction.   ACTIVITY LIMITATIONS: carrying, lifting, bending, sitting, standing, squatting, stairs, transfers, and locomotion level  PARTICIPATION LIMITATIONS: meal prep, cleaning, laundry, interpersonal relationship, driving, shopping, community activity, and occupation  PERSONAL FACTORS: Age, Behavior pattern, Fitness, Past/current experiences, Time since onset of injury/illness/exacerbation, and 1-2 comorbidities: hemorrhagic CVA  are also affecting patient's functional outcome.   REHAB POTENTIAL: Poor see PMH and personal factors  CLINICAL DECISION MAKING: Evolving/moderate complexity  EVALUATION COMPLEXITY: Moderate  PLAN:  PT FREQUENCY: 1x/week  PT DURATION: 8 weeks  PLANNED INTERVENTIONS:  Therapeutic exercises, Therapeutic activity, Neuromuscular re-education, Balance training, Gait training, Patient/Family education, Self Care, Stair training, Vestibular training, Visual/preceptual remediation/compensation, Orthotic/Fit training, DME instructions, Electrical stimulation, Manual therapy, and Re-evaluation  PLAN FOR NEXT SESSION:  How is HEP? Gait  training.  R NMR.  Walking program?  Endurance. Quad strength    Westley Foots, PT, DPT, CBIS 06/10/2023, 1:45 PM

## 2023-06-17 ENCOUNTER — Ambulatory Visit: Payer: Managed Care, Other (non HMO) | Admitting: Physical Therapy

## 2023-06-17 ENCOUNTER — Encounter: Payer: Self-pay | Admitting: Physical Therapy

## 2023-06-17 ENCOUNTER — Ambulatory Visit: Payer: Managed Care, Other (non HMO) | Admitting: Occupational Therapy

## 2023-06-17 VITALS — BP 153/98 | HR 103

## 2023-06-17 DIAGNOSIS — R293 Abnormal posture: Secondary | ICD-10-CM

## 2023-06-17 DIAGNOSIS — R278 Other lack of coordination: Secondary | ICD-10-CM

## 2023-06-17 DIAGNOSIS — I69251 Hemiplegia and hemiparesis following other nontraumatic intracranial hemorrhage affecting right dominant side: Secondary | ICD-10-CM | POA: Diagnosis not present

## 2023-06-17 DIAGNOSIS — R2681 Unsteadiness on feet: Secondary | ICD-10-CM

## 2023-06-17 DIAGNOSIS — R2689 Other abnormalities of gait and mobility: Secondary | ICD-10-CM

## 2023-06-17 DIAGNOSIS — M6281 Muscle weakness (generalized): Secondary | ICD-10-CM

## 2023-06-17 NOTE — Therapy (Signed)
OUTPATIENT PHYSICAL THERAPY NEURO TREATMENT - DISCHARGE SUMMARY   Patient Name: Melanie Madden MRN: [id number] DOB:08/13/1965, 57 y.o., female Today's Date: 06/17/2023   PCP: Sigmund Hazel, MD REFERRING PROVIDER: Cathren Harsh, MD PHYSICAL THERAPY DISCHARGE SUMMARY  Visits from Start of Care: 7  Current functional level related to goals / functional outcomes: See clinical impression statement.   Remaining deficits: Significant fall risk and decreased ambulatory safety, aphasia   Education / Equipment: Discussed BP and follow-up with PCP about medication management - resuming amlodipine?  Progress towards goals and plan for discharge today.  Instructed caregiver to obtain new referral for re-evaluation should status change and also due to benefit of episodic care to prevent significant decline.  Discussed emotional/lability toll on BP and concern for significant swing in diastolic activity following non-strenuous activity and how this continues to limit progression of PT.   Patient agrees to discharge. Patient goals were not met. Patient is being discharged due to maximized rehab potential.   END OF SESSION:  PT End of Session - 06/17/23 1408     Visit Number 7    Number of Visits 9    Date for PT Re-Evaluation 06/28/23    Authorization Type [employer]    PT Start Time 1403    PT Stop Time 1441    PT Time Calculation (min) 38 min    Equipment Utilized During Treatment Gait belt    Activity Tolerance Treatment limited secondary to medical complications (Comment);Other (comment)   somewhat elevated diastolic compared to prior OT session reading; refusal to participate in assessments   Behavior During Therapy Agitated;WFL for tasks assessed/performed               Past Medical History:  Diagnosis Date   Allergy    Anxiety    Cancer of appendix (HCC) Stage 4    Depression    Expressive aphasia    Hemiparesis (HCC)    Right side   High triglycerides     History of kidney stones    Hypertension    Kidney stones    Recurrent sinus infections    Septic shock (HCC) 03/26/2023   Septic shock (HCC) 02/08/2021   Stroke (HCC) 07/09/2019   Past Surgical History:  Procedure Laterality Date   ANTERIOR CRUCIATE LIGAMENT REPAIR Left    BUNIONECTOMY Right    CYSTOSCOPY/URETEROSCOPY/HOLMIUM LASER/STENT PLACEMENT Right 02/08/2021   Procedure: CYSTOSCOPY/RETROGRADE/URETEROSCOPY/HOLMIUM LASER/STENT PLACEMENT, NEPHROSTOMY TUBE REMOVAL;  Surgeon: Rene Paci, MD;  Location: [facility];  Service: Urology;  Laterality: Right;   CYSTOSCOPY/URETEROSCOPY/HOLMIUM LASER/STENT PLACEMENT Bilateral 04/10/2023   Procedure: CYSTOSCOPY, BILATERAL URETEROSCOPY, BILATERAL RETROGRADE PYELOGRAM, HOLMIUM LASER LITHOTRIPSY, AND BILATERAL URETERAL STENT PLACEMENT, removal of percutanous stent post op;  Surgeon: Rene Paci, MD;  Location: [facility];  Service: Urology;  Laterality: Bilateral;  90 MINUTES   IR NEPHROSTOMY PLACEMENT RIGHT  12/09/2020   IR NEPHROSTOMY PLACEMENT RIGHT  03/27/2023   LASIK  2008   LIPOSUCTION     LITHOTRIPSY     MENISCUS REPAIR Left    NASAL SEPTUM SURGERY     ROBOTIC ASSISTED TOTAL HYSTERECTOMY WITH BILATERAL SALPINGO OOPHERECTOMY Bilateral 12/06/2021   Procedure: XI ROBOTIC ASSISTED BILATERAL SALPINGO OOPHORECTOMY, TOTAL HYSTERECTOMY, PERITONEAL BIOPSY, OMENTECTOMY, LAPAROTOMY, APPENDECTOMY, PAP SMEAR;  Surgeon: Carver Fila, MD;  Location: [facility];  Service: Gynecology;  Laterality: Bilateral;   SHOULDER SURGERY Right    VARICOSE VEIN SURGERY Left    Patient Active Problem List   Diagnosis Date Noted  Sepsis secondary to UTI (HCC) 03/29/2023   Obstruction of right ureteropelvic junction (UPJ) due to stone 03/29/2023   Hydronephrosis 03/29/2023   Bacteremia 03/29/2023   Stroke (cerebrum) (HCC) 03/29/2023   Ovarian mass 12/06/2021   Pelvic mass in female 11/06/2021   Urinary retention    E.  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   Hypernatremia    Leukocytosis    Essential hypertension    Global aphasia    Cytotoxic brain edema (HCC) 07/10/2019   ICH (intracerebral hemorrhage) (HCC) 07/09/2019   Anxiety state 02/21/2015   Cephalalgia 12/21/2014    ONSET DATE: 2020  REFERRING DIAG: Z61.09 (ICD-10-CM) - Status post CVA  THERAPY DIAG:  Muscle weakness (generalized)  Other lack of coordination  Other abnormalities of gait and mobility  Abnormal posture  Unsteadiness on feet  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 Denies falls. Denies pain.  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 coli UTI with obstructing right renal stone, appendiceal adenocarcinoma stage IV, diagnosed on TAH/BSO in 11/2021  PAIN:  Are you having pain? No  PRECAUTIONS: Fall and Other: aphasic, R hemi  PATIENT GOALS: Caregiver endorses wanting her to work on her R side.  OBJECTIVE:  Note: Objective measures were completed at Evaluation unless otherwise  noted.  DIAGNOSTIC FINDINGS:   MRI Brain 07/11/2019: IMPRESSION: 1. Large intra-axial hemorrhage in the left hemisphere centered at the left lentiform nucleus with estimated 53 mL volume appears not significantly changed in size since 07/09/2019. Superimposed restricted diffusion suggesting this is hemorrhagic transformation of an ischemic infarct. Surrounding edema and regional mass effect including mildly increased rightward midline shift of 6-7 mm. No ventricular trapping.  Basilar cisterns remain patent.   2. Trace intraventricular hemorrhage. No definite extra-axial extension of blood (although see #3).   3. Small indeterminate extra-axial collection or mass in the left prepontine cistern measuring 15 x 7 x 11 mm. 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 Small volume subdural hematoma less likely. Follow-up post-contrast brain MRI would be necessary to confirm.   4. 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  No recent relevant imaging.  VITALS  Vitals:   06/17/23 1405  BP: (!) 153/98  Pulse: (!) 103        TODAY'S TREATMENT:                 06/17/2023 -5xSTS:  31.19 sec w/ LUE support and uncontrolled descent - :  58.71 sec = 0.17 m/sec OR 0.56 ft/sec w/ quad cane SBA -Established HEP in visits prior, pt states "no" and shakes head when asked if doing, refuses review -Pt endorses via yes/no questions that she is walking back and forth in the home everyday; attempted brief education on progression of time and distance as safely able, pt distracted in open gym  environment so unsure of retention, re-iterated to caregiver on return from restroom -BERG:  [facility] PT Assessment - 06/17/23 1424       Berg Balance Test   Sit to Stand Able to stand using hands after several tries    Standing Unsupported Able to stand 30 seconds unsupported   pt initiates sitting at 1 minute 32 seconds unsupported   Sitting with Back Unsupported but Feet Supported on Floor or Stool Able to sit safely and securely 2 minutes    Stand to Sit Sits independently, has uncontrolled descent    Transfers Able to transfer with verbal cueing and /or supervision    Standing Unsupported with Eyes Closed Able to stand 3 seconds   grabs for cane at 6 seconds   Standing Unsupported with Feet Together Needs help to attain position and unable to hold for 15 seconds    From Standing, Reach Forward with Outstretched Arm Reaches forward but needs supervision    From Standing Position, Pick up Object from Floor Unable to try/needs assist to keep balance   pt unwilling to attempt repeating "no" initiating sitting and picking up from sitting, PT reinforces that this may be safer technique for her.   From Standing Position, Turn to Look Behind Over each Shoulder Turn sideways only but maintains balance    Turn 360 Degrees Needs assistance while turning    Standing Unsupported, Alternately Place Feet on Step/Stool Needs assistance to keep from falling or unable to try   pt unwilling to try despite various forms of offered assistance   Standing Unsupported, One Foot in Front Loses balance while stepping or standing   cannot step left foot forward and hold   Standing on One Leg Unable to try or needs assist to prevent fall    Total Score 16    Berg comment: 16/56 = significant fall risk            PATIENT EDUCATION: Education details: Discussed  BP and follow-up with PCP about medication management - resuming amlodipine?  Progress towards goals and plan for discharge today.  Instructed caregiver to  obtain new referral for re-evaluation should status change and also due to benefit of episodic care to prevent significant decline.  Discussed emotional/lability toll on BP and concern for significant swing in diastolic activity following non-strenuous activity and how this continues to limit progression of PT. Person educated: Patient and Engineer, maintenance (IT)   Education method: Explanation, Demonstration, Tactile cues, Verbal cues, and Handouts Education comprehension: returned demonstration, tactile cues required, and needs further education  HOME EXERCISE PROGRAM: Access Code: [email or web address] URL: [email or web address] Date: 05/20/2023 Prepared by: Alethia Berthold Plaster  Exercises - Supine Short Arc Quad  - 1 x daily - 7 x weekly - 1-2 sets - 10 reps - Supine Bridge  - 1 x daily - 7 x weekly - 3 sets - 10 reps - Seated Scapular Retraction  - 1 x daily - 7 x weekly - 3 sets - 10 reps - Sit to Stand with Armchair  - 1 x daily - 7 x weekly - 1-2 sets - 5-7 reps  GOALS: Goals reviewed with patient? Yes  SHORT TERM GOALS: Target date: 05/31/2023  Pt will decrease 5xSTS to <28.31 seconds w/ improved eccentric control in order to demonstrate decreased risk for falls and improved functional bilateral LE strength and power. Baseline:  33.31 sec w/ LUE support and uncontrolled descent Goal status: INITIAL  2. Pt will improve gait speed to >/= .71m/s to demonstrate improved community ambulation  Baseline:  .55m/s Goal status: INITIAL  3.  Pt will improve Berg score to 26/56 for decreased fall risk  Baseline:  23/56 Goal status: REVISED  4.  Pt will be independent and compliant with initial strength and balance HEP in order to maintain functional progress and improve mobility. Baseline:  To be established. Goal status: INITIAL  LONG TERM GOALS: Target date: 06/28/2023  Pt will decrease 5xSTS to <23.31 seconds w/ improved eccentric control in order to demonstrate decreased risk for falls and  improved functional bilateral LE strength and power. Baseline:  33.31 sec w/ LUE support and uncontrolled descent; 31.19 sec w/ LUE support and uncontrolled descent (11/18) Goal status: NOT MET  2.  Pt will improve gait speed to >/= .63m/s to demonstrate improved community ambulation  Baseline:  .80m/s; 0.17 m/sec w/ quad cane SBA (11/18) Goal status: NOT MET  3.  Pt will improve Berg score to 30/56 for decreased fall risk Baseline:  23/56;  Goal status: REVISED   4.  Pt will be independent and compliant with finalized strength and balance HEP in order to maintain functional progress and improve mobility. Baseline:  Established, pt states "no" and shakes head when asked if doing, refuses review (11/18) Goal status: NOT MET  5.  Patient will be compliant to formal walking program >/= 3 days per week with supervision for safety to improve aerobic tolerance and ambulatory mechanics. Baseline:  Pt endorses via yes/no questions that she is walking back and forth in the home everyday (11/18) Goal status: MET  ASSESSMENT:  CLINICAL IMPRESSION: Patient has been significantly limited with PT progression due to behavioral pattern and HTN.  She made little to no significant progress today and caregiver endorses difficulty getting patient to engage to tasks at home.  PT has had limited success engaging patient to tasks as well, but pt would likely benefit from episodic care in the future to ensure no significant decline.  Will  discharge at this time with education provided to caregiver and pt this visit.  OBJECTIVE IMPAIRMENTS: Abnormal gait, decreased activity tolerance, decreased balance, decreased cognition, decreased coordination, decreased endurance, decreased knowledge of condition, decreased knowledge of use of DME, decreased mobility, difficulty walking, decreased ROM, decreased strength, decreased safety awareness, impaired perceived functional ability, impaired tone, impaired UE functional  use, improper body mechanics, and postural dysfunction.   ACTIVITY LIMITATIONS: carrying, lifting, bending, sitting, standing, squatting, stairs, transfers, and locomotion level  PARTICIPATION LIMITATIONS: meal prep, cleaning, laundry, interpersonal relationship, driving, shopping, community activity, and occupation  PERSONAL FACTORS: Age, Behavior pattern, Fitness, Past/current experiences, Time since onset of injury/illness/exacerbation, and 1-2 comorbidities: hemorrhagic CVA  are also affecting patient's functional outcome.   REHAB POTENTIAL: Poor see PMH and personal factors  CLINICAL DECISION MAKING: Evolving/moderate complexity  EVALUATION COMPLEXITY: Moderate  PLAN:  PT FREQUENCY: 1x/week  PT DURATION: 8 weeks  PLANNED INTERVENTIONS: Therapeutic exercises, Therapeutic activity, Neuromuscular re-education, Balance training, Gait training, Patient/Family education, Self Care, Stair training, Vestibular training, Visual/preceptual remediation/compensation, Orthotic/Fit training, DME instructions, Electrical stimulation, Manual therapy, and Re-evaluation  PLAN FOR NEXT SESSION:  N/A  Camille Bal, PT, DPT 06/17/2023, 4:18 PM

## 2023-06-17 NOTE — Therapy (Unsigned)
OUTPATIENT OCCUPATIONAL THERAPY NEURO TREATMENT  Patient Name: Madigan Hellums MRN: [id number] DOB:[birth year], 57 y.o., female Today's Date: 06/17/2023  PCP: Sigmund Hazel, MD REFERRING PROVIDER: Cathren Harsh, MD  END OF SESSION:  OT End of Session - 06/17/23 1324     Visit Number 4    Number of Visits 6    Authorization Type [employer] 2024 $50 copay per day; VL; 60 (combined PT/OT/ST) multi D/same day visits = 1 visit Auth Reqd/ Med Review @ 5th visit    Authorization - Number of Visits 5    OT Start Time 1320    OT Stop Time 1400    OT Time Calculation (min) 40 min    Equipment Utilized During Treatment NeuroRehab Glove    Activity Tolerance Patient tolerated treatment well    Behavior During Therapy [facility] for tasks assessed/performed   Irritable but cooperative            Past Medical History:  Diagnosis Date   Allergy    Anxiety    Cancer of appendix (HCC) Stage 4    Depression    Expressive aphasia    Hemiparesis (HCC)    Right side   High triglycerides    History of kidney stones    Hypertension    Kidney stones    Recurrent sinus infections    Septic shock (HCC) 03/26/2023   Septic shock (HCC) 02/08/2021   Stroke (HCC) 07/09/2019   Past Surgical History:  Procedure Laterality Date   ANTERIOR CRUCIATE LIGAMENT REPAIR Left    BUNIONECTOMY Right    CYSTOSCOPY/URETEROSCOPY/HOLMIUM LASER/STENT PLACEMENT Right 02/08/2021   Procedure: CYSTOSCOPY/RETROGRADE/URETEROSCOPY/HOLMIUM LASER/STENT PLACEMENT, NEPHROSTOMY TUBE REMOVAL;  Surgeon: Rene Paci, MD;  Location: [facility];  Service: Urology;  Laterality: Right;   CYSTOSCOPY/URETEROSCOPY/HOLMIUM LASER/STENT PLACEMENT Bilateral 04/10/2023   Procedure: CYSTOSCOPY, BILATERAL URETEROSCOPY, BILATERAL RETROGRADE PYELOGRAM, HOLMIUM LASER LITHOTRIPSY, AND BILATERAL URETERAL STENT PLACEMENT, removal of percutanous stent post op;  Surgeon: Rene Paci, MD;  Location: [facility];  Service: Urology;  Laterality: Bilateral;  90 MINUTES   IR NEPHROSTOMY PLACEMENT RIGHT  12/09/2020   IR NEPHROSTOMY PLACEMENT RIGHT  03/27/2023   LASIK  2008   LIPOSUCTION     LITHOTRIPSY     MENISCUS REPAIR Left    NASAL SEPTUM SURGERY     ROBOTIC ASSISTED TOTAL HYSTERECTOMY WITH BILATERAL SALPINGO OOPHERECTOMY Bilateral 12/06/2021   Procedure: XI ROBOTIC ASSISTED BILATERAL SALPINGO OOPHORECTOMY, TOTAL HYSTERECTOMY, PERITONEAL BIOPSY, OMENTECTOMY, LAPAROTOMY, APPENDECTOMY, PAP SMEAR;  Surgeon: Carver Fila, MD;  Location: [facility];  Service: Gynecology;  Laterality: Bilateral;   SHOULDER SURGERY Right    VARICOSE VEIN SURGERY Left    Patient Active Problem List   Diagnosis Date Noted   Sepsis secondary to UTI (HCC) 03/29/2023   Obstruction of right ureteropelvic junction (UPJ) due to stone 03/29/2023   Hydronephrosis 03/29/2023   Bacteremia 03/29/2023   Stroke (cerebrum) (HCC) 03/29/2023   Ovarian mass 12/06/2021   Pelvic mass in female 11/06/2021   Urinary retention    E. 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   Hypernatremia    Leukocytosis    Essential hypertension    Global aphasia    Cytotoxic brain edema (HCC) 07/10/2019   ICH (intracerebral hemorrhage) (HCC) 07/09/2019   Anxiety state 02/21/2015   Cephalalgia 12/21/2014    ONSET DATE: 04/01/2023  REFERRING DIAG: Hemiplegia and hemiparesis following nontraumatic intracerebral hemorrhage affecting right  dominant side (HCC)  THERAPY DIAG:  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 coli UTI with obstructing right renal stone, appendiceal adenocarcinoma stage IV, diagnosed on TAH/BSO in 11/2021  Patient hospitalized due to urosepsis from an obstructing 15 mm right UPJ stone. She underwent right-sided nephrostomy tube on 03/27/2023   PRECAUTIONS: None  WEIGHT BEARING RESTRICTIONS: No  PAIN:  Are you having pain? No  FALLS: Has patient fallen in last 6 months? No  Lives with: lives with their spouse; caregiver (CNA) present everyday 5am until her husband gets home Lives in: House/apartment Stairs: Yes: Internal: 16 steps; can reach both and External: 2 steps; none Has following equipment at home: Quad cane small base, Environmental consultant - 4 wheeled, Wheelchair (manual), Shower bench, and chair lift  PLOF: Independent - Prior to 2021  PATIENT GOALS: Points to R side when asked.  OBJECTIVE:  Note: Objective measures were completed at Evaluation unless otherwise noted.  HAND DOMINANCE: Ambidextrous - previously.  ADLs: Overall ADLs: Pt indicates she does it herself and caregiver confirms this by reports of husband also. Transfers/ambulation related to ADLs: Eating: Ind with L hand Grooming: Help with hair as needed UB Dressing: Pt reports she dresses herself - doesn't do buttons  LB Dressing: Dons socks, shoes and brace Toileting: Ind Bathing: Pt reports showering herself and transferring on her own Tub Shower transfers: Mod I with shower bench Equipment: Transfer tub  bench  IADLs: Shopping: Spouse or caregivers Light housekeeping: Caregivers Meal Prep: Caregivers Community mobility: Supervised with quad cane Medication management: Husband Financial management: Husband Handwriting:  NT  MOBILITY STATUS: Independent and difficulty carrying objections with ambulation  POSTURE COMMENTS:  forward head and Flaccid RUE Sitting balance: No   ACTIVITY TOLERANCE: Activity tolerance: Fair, reliant on quad cane for all mobility, slow pace  FUNCTIONAL OUTCOME MEASURES: NT  UPPER EXTREMITY ROM:    ROM Right Eval - PROM Left Eval -  AROM  Shoulder flexion 85 WNL   Shoulder abduction 75   Shoulder adduction    Shoulder extension    Shoulder internal rotation    Shoulder external rotation    Elbow flexion WNL   Elbow extension WNL   Wrist flexion WNL   Wrist extension WNL   Wrist ulnar deviation    Wrist radial deviation    Wrist pronation    Wrist supination    (Blank rows = not tested)  UPPER EXTREMITY MMT:     MMT Right eval Left eval  Shoulder flexion Generally 0 to 1(flickers) mostly with associate reactions (yawning - elbow flexes) and slight finger flexion/grip 4  Shoulder abduction    Shoulder adduction  Shoulder extension    Shoulder internal rotation    Shoulder external rotation    Middle trapezius    Lower trapezius    Elbow flexion    Elbow extension    Wrist flexion    Wrist extension    Wrist ulnar deviation    Wrist radial deviation    Wrist pronation    Wrist supination    (Blank rows = not tested)  HAND FUNCTION: L - WFL grip 57.9, 47.3, 47.6 - Average R - 50.9 lbs R - NA  COORDINATION: L - WFL  R - NA  SENSATION: R side feels "different"  EDEMA: Pt reports NA  MUSCLE TONE: RUE: Flaccid  COGNITION: Overall cognitive status: Difficulty to assess due to: Communication impairment  VISION: Subjective report: Pt preports no difficulty Baseline vision: No visual deficits Visual history:  NA  VISION ASSESSMENT: Not tested  Patient has difficulty with following activities due to following visual impairments: None reported  PERCEPTION: Not tested  PRAXIS: Not tested  OBSERVATIONS: Pt ambulates with quad cane with no loss of balance but some difficulty following directions to step up to the table so OTR could bring the chair behind her to sit.  She has very flaccid R UE that simple hangs during ambulation and has a significant subluxation.  Pt has aphasia but is able to articulate a few individual words - yes/no etc.  Pt needs extra time for sit to stand with some stability of chair to get up.     TODAY'S TREATMENT:                                                                                                                                Completed modification of previously fabricated thermoplastic resting hand splint for Rt hand to prevent composite flexion, improve skin integrity in palm and for greater ease with hygiene care. OTR able to trim edges of splint, straighten finger pan and apply straps for improved fit and good stretch to fingers and thumb.  Stockingnette applied to forearm and pt instructions provided to pt and caregiver in printed format.    Neuromuscular reeducation provided, including specific visual, tactile and verbal cues and physical assistance to stimulate the neuromuscular system and promote functional ROM, movement and use of R UE for activities such as gravity eliminated elbow flexion/extension, shoulder flexion/abduction with education for use of gravity and gravity eliminated planes  Continued gentle stretch to Rt hand prior to splint application.   PATIENT EDUCATION: Education details: Splint education Person educated: Patient and Engineer, maintenance (IT) Education method: Explanation, Verbal cues, and Handouts Education comprehension: verbalized understanding and needs further education  HOME EXERCISE PROGRAM: 06/03/23 - splint  recommendations   GOALS: Goals reviewed with patient? Yes  SHORT TERM GOALS: Target date: 06/07/23  Patient/caregiver will demonstrate initial RUE PROM/AAROM HEP with 25% verbal cues or less for proper execution. Baseline: New to outpt OT - flaccid RUE Goal status: IN  progress  2.  Patient and family will be aware of home based options for HEP (Neuro Rehab glove, estim, kinesio taping). Baseline: New to outpt OT - flaccid RUE Goal status: IN Progress   LONG TERM GOALS: Target date: 07/05/23  Patient/caregiver will demonstrate updated RUE HEP with handouts only for proper execution. Baseline: New to outpt OT - flaccid RUE Goal status: INITIAL  2.  Caregivers will be able to apply kinesio tape for L shoulder subluxation support 1-2x/week. Baseline: New to outpt OT - flaccid RUE Goal status: INITIAL  3.  Caregivers will be independent with neuro re-ed activities for R UE stimulation and AAROM as deemed available and appropriate by family for home use (ie. Neuro Rehab glove and/or estim) Baseline: New to outpt OT - flaccid RUE Goal status: IN progress  4.  Caregivers will be able to assist with splint application as needed/tolerated by pt for R thumb ROM and sensory stimulation of R hand and shoulder subluxation as tolerated. Baseline: New to outpt OT - flaccid RUE but some thumb flexion tightness; shoulder subluxation > thumb width Goal status: IN progress  ASSESSMENT:  CLINICAL IMPRESSION: Patient is seen today for occupational therapy treatment for chronic CVA with R flaccid hemiplegia.  Patient tolerated splint fabrication/fitting well today.  She still has significant R UE non-function and needs printed HEP.  She also received a neuro glove and is encouraged to bring it to therapy next time for training.  She will benefit from continued skilled OT services in the outpatient setting to work on impairments as to help pt establish HEP for carryover at home in hopes of further neuro  return over time as able.     PERFORMANCE DEFICITS: in functional skills including ADLs, IADLs, coordination, dexterity, sensation, tone, ROM, strength, Fine motor control, Gross motor control, balance, endurance, decreased knowledge of precautions, decreased knowledge of use of DME, and UE functional use, cognitive skills including memory, problem solving, safety awareness, sequencing, temperament/personality, thought, and understand, and psychosocial skills including coping strategies, environmental adaptation, and interpersonal interactions.   IMPAIRMENTS: are limiting patient from ADLs, IADLs, leisure, and social participation.   CO-MORBIDITIES: may have co-morbidities  that affects occupational performance. Patient will benefit from skilled OT to address above impairments and improve overall function.  REHAB POTENTIAL: Fair due to chronicity of CVA   PLAN:  OT FREQUENCY: 1x/week  OT DURATION: 6 weeks  PLANNED INTERVENTIONS: 97535 self care/ADL training, 63875 therapeutic exercise, 97530 therapeutic activity, 97112 neuromuscular re-education, 97140 manual therapy, [id number] electrical stimulation (manual), 97014 electrical stimulation unattended, 97760 Orthotics management and training, 64332 Splinting (initial encounter), [id number] Subsequent splinting/medication, passive range of motion, coping strategies training, patient/family education, and DME and/or AE instructions  RECOMMENDED OTHER SERVICES: Pt has PT services in place at this time.  CONSULTED AND AGREED WITH PLAN OF CARE: Patient and Caregiver  PLAN FOR NEXT SESSION:  Pt to bring Neuro Glove for fitting and training  Check resting hand splint Needs printed HEP ideas for AAROM Estim trials  Victorino Sparrow, OT 06/17/2023, 4:57 PM

## 2023-06-24 ENCOUNTER — Ambulatory Visit: Payer: Managed Care, Other (non HMO)

## 2023-06-24 ENCOUNTER — Encounter: Payer: Self-pay | Admitting: Occupational Therapy

## 2023-06-24 ENCOUNTER — Ambulatory Visit: Payer: Managed Care, Other (non HMO) | Admitting: Occupational Therapy

## 2023-06-24 DIAGNOSIS — I69251 Hemiplegia and hemiparesis following other nontraumatic intracranial hemorrhage affecting right dominant side: Secondary | ICD-10-CM | POA: Diagnosis not present

## 2023-06-24 DIAGNOSIS — R293 Abnormal posture: Secondary | ICD-10-CM

## 2023-06-24 DIAGNOSIS — M6281 Muscle weakness (generalized): Secondary | ICD-10-CM

## 2023-06-24 DIAGNOSIS — R2681 Unsteadiness on feet: Secondary | ICD-10-CM

## 2023-06-24 NOTE — Therapy (Signed)
OUTPATIENT OCCUPATIONAL THERAPY NEURO TREATMENT  Patient Name: Melanie Madden MRN: [id number] DOB:1966/05/01, 57 y.o., female Today's Date: 06/24/2023  PCP: Sigmund Hazel, MD REFERRING PROVIDER: Cathren Harsh, MD  END OF SESSION:  OT End of Session - 06/24/23 1409     Visit Number 5    Number of Visits 6    Authorization Type [employer] 2024 $50 copay per day; VL; 60 (combined PT/OT/ST) multi D/same day visits = 1 visit Auth Reqd/ Med Review @ 5th visit    Authorization - Number of Visits 5    OT Start Time 1408    OT Stop Time 1450    OT Time Calculation (min) 42 min    Equipment Utilized During Treatment NeuroRehab Glove    Activity Tolerance Patient tolerated treatment well    Behavior During Therapy WFL for tasks assessed/performed   Irritable but cooperative            Past Medical History:  Diagnosis Date   Allergy    Anxiety    Cancer of appendix (HCC) Stage 4    Depression    Expressive aphasia    Hemiparesis (HCC)    Right side   High triglycerides    History of kidney stones    Hypertension    Kidney stones    Recurrent sinus infections    Septic shock (HCC) 03/26/2023   Septic shock (HCC) 02/08/2021   Stroke (HCC) 07/09/2019   Past Surgical History:  Procedure Laterality Date   ANTERIOR CRUCIATE LIGAMENT REPAIR Left    BUNIONECTOMY Right    CYSTOSCOPY/URETEROSCOPY/HOLMIUM LASER/STENT PLACEMENT Right 02/08/2021   Procedure: CYSTOSCOPY/RETROGRADE/URETEROSCOPY/HOLMIUM LASER/STENT PLACEMENT, NEPHROSTOMY TUBE REMOVAL;  Surgeon: Rene Paci, MD;  Location: [facility];  Service: Urology;  Laterality: Right;   CYSTOSCOPY/URETEROSCOPY/HOLMIUM LASER/STENT PLACEMENT Bilateral 04/10/2023   Procedure: CYSTOSCOPY, BILATERAL URETEROSCOPY, BILATERAL RETROGRADE PYELOGRAM, HOLMIUM LASER LITHOTRIPSY, AND BILATERAL URETERAL STENT PLACEMENT, removal of percutanous stent post op;  Surgeon: Rene Paci, MD;  Location: [facility];  Service: Urology;  Laterality: Bilateral;  90 MINUTES   IR NEPHROSTOMY PLACEMENT RIGHT  12/09/2020   IR NEPHROSTOMY PLACEMENT RIGHT  03/27/2023   LASIK  2008   LIPOSUCTION     LITHOTRIPSY     MENISCUS REPAIR Left    NASAL SEPTUM SURGERY     ROBOTIC ASSISTED TOTAL HYSTERECTOMY WITH BILATERAL SALPINGO OOPHERECTOMY Bilateral 12/06/2021   Procedure: XI ROBOTIC ASSISTED BILATERAL SALPINGO OOPHORECTOMY, TOTAL HYSTERECTOMY, PERITONEAL BIOPSY, OMENTECTOMY, LAPAROTOMY, APPENDECTOMY, PAP SMEAR;  Surgeon: Carver Fila, MD;  Location: [facility];  Service: Gynecology;  Laterality: Bilateral;   SHOULDER SURGERY Right    VARICOSE VEIN SURGERY Left    Patient Active Problem List   Diagnosis Date Noted   Sepsis secondary to UTI (HCC) 03/29/2023   Obstruction of right ureteropelvic junction (UPJ) due to stone 03/29/2023   Hydronephrosis 03/29/2023   Bacteremia 03/29/2023   Stroke (cerebrum) (HCC) 03/29/2023   Ovarian mass 12/06/2021   Pelvic mass in female 11/06/2021   Urinary retention    E. 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   Hypernatremia    Leukocytosis    Essential hypertension    Global aphasia    Cytotoxic brain edema (HCC) 07/10/2019   ICH (intracerebral hemorrhage) (HCC) 07/09/2019   Anxiety state 02/21/2015   Cephalalgia 12/21/2014    ONSET DATE: 04/01/2023  REFERRING DIAG: Hemiplegia and hemiparesis following nontraumatic intracerebral hemorrhage affecting right  dominant side (HCC)  THERAPY DIAG:  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 coli UTI with obstructing right renal stone, appendiceal adenocarcinoma stage IV, diagnosed on TAH/BSO in 11/2021  Patient hospitalized due to urosepsis from an obstructing 15 mm right UPJ stone. She underwent right-sided nephrostomy tube on 03/27/2023   PRECAUTIONS: None  WEIGHT BEARING RESTRICTIONS: No  PAIN:  Are you having pain? No  FALLS: Has patient fallen in last 6 months? No  Lives with: lives with their spouse; caregiver (CNA) present everyday 5am until her husband gets home Lives in: House/apartment Stairs: Yes: Internal: 16 steps; can reach both and External: 2 steps; none Has following equipment at home: Quad cane small base, Environmental consultant - 4 wheeled, Wheelchair (manual), Shower bench, and chair lift  PLOF: Independent - Prior to 2021  PATIENT GOALS: Points to R side when asked.  OBJECTIVE:  Note: Objective measures were completed at Evaluation unless otherwise noted.  HAND DOMINANCE: Ambidextrous - previously.  ADLs: Overall ADLs: Pt indicates she does it herself and caregiver confirms this by reports of husband also. Transfers/ambulation related to ADLs: Eating: Ind with L hand Grooming: Help with hair as needed UB Dressing: Pt reports she dresses herself - doesn't do buttons  LB Dressing: Dons socks, shoes and brace Toileting: Ind Bathing: Pt reports showering herself and transferring on her own Tub  Shower transfers: Mod I with shower bench Equipment: Transfer tub bench  IADLs: Shopping: Spouse or caregivers Light housekeeping: Caregivers Meal Prep: Caregivers Community mobility: Supervised with quad cane Medication management: Husband Financial management: Husband Handwriting:  NT  MOBILITY STATUS: Independent and difficulty carrying objections with ambulation  POSTURE COMMENTS:  forward head and Flaccid RUE Sitting balance: No   ACTIVITY TOLERANCE: Activity tolerance: Fair, reliant on quad cane for all mobility, slow pace  FUNCTIONAL OUTCOME MEASURES: NT  UPPER EXTREMITY ROM:    ROM Right Eval - PROM Left Eval -  AROM  Shoulder flexion 85 WNL   Shoulder abduction 75   Shoulder adduction    Shoulder extension    Shoulder internal rotation    Shoulder external rotation    Elbow flexion WNL   Elbow extension WNL   Wrist flexion WNL   Wrist extension WNL   Wrist ulnar deviation    Wrist radial deviation    Wrist pronation    Wrist supination    (Blank rows = not tested)  UPPER EXTREMITY MMT:     MMT Right eval Left eval  Shoulder flexion Generally 0 to 1(flickers) mostly with associate reactions (yawning - elbow flexes) and slight finger  flexion/grip 4  Shoulder abduction    Shoulder adduction    Shoulder extension    Shoulder internal rotation    Shoulder external rotation    Middle trapezius    Lower trapezius    Elbow flexion    Elbow extension    Wrist flexion    Wrist extension    Wrist ulnar deviation    Wrist radial deviation    Wrist pronation    Wrist supination    (Blank rows = not tested)  HAND FUNCTION: L - WFL grip 57.9, 47.3, 47.6 - Average R - 50.9 lbs R - NA  COORDINATION: L - WFL  R - NA  SENSATION: R side feels "different"  EDEMA: Pt reports NA  MUSCLE TONE: RUE: Flaccid  COGNITION: Overall cognitive status: Difficulty to assess due to: Communication impairment  VISION: Subjective report: Pt preports no  difficulty Baseline vision: No visual deficits Visual history: NA  VISION ASSESSMENT: Not tested  Patient has difficulty with following activities due to following visual impairments: None reported  PERCEPTION: Not tested  PRAXIS: Not tested  OBSERVATIONS: Pt ambulates with quad cane with no loss of balance but some difficulty following directions to step up to the table so OTR could bring the chair behind her to sit.  She has very flaccid R UE that simple hangs during ambulation and has a significant subluxation.  Pt has aphasia but is able to articulate a few individual words - yes/no etc.  Pt needs extra time for sit to stand with some stability of chair to get up.     TODAY'S TREATMENT:                                                                                                                                Neuromuscular reeducation provided for self PROM and AA/ROM as able RUE - pt/caregiver shown ex's to perform at home including: modified table slides (using transfer board on lower surface d/t pt's height in w/c) in sh flex/ext and horizontal abd/add with hand over hand assist; shoulder shrugs for scapula elevation w/ limited movement Rt shoulder (advised to do in front of mirror); and self PROM for shoulder flexion.  Caregiver also shown gentle stretching in RUE sh flexion - pt could tolerate sh flexion to approx 110* w/ no indication of pain  Education provided re: use of neuro glove including position of wrist, use of mirror glove and activities to work on with glove.  She benefitted from wrist support to minimize wrist drop and trial of wrist splint was helpful at minimizing need to hold her wrist and arm.  Different positions of thumb straps were trialled to improve position of thumb for better grasp of textured ball included with device.  Also engaged in mirroring unaffected L side with R hand with mirror glove ie) as L hand opened, R hand also opened and vice versa when L hand  closes, R hand did  too.  Assistance provided throughout to position textured ball to hold and release appropriately as support at wrist was helpful. Also shown additional ex using both hands to hold/release larger ball (slightly deflated) for tactile and visual feedback w/ forearm neutral. Attempted cylindrical grasp around cups (1 hand on each cup, however sufficient thumb abduction and opposition needed for task prevented machine from closing Rt hand).  Pt/caregiver shown way to hold thumb in sleeve of neuro glove using coban wrap at distal end of thumb.   PATIENT EDUCATION: Education details: Museum/gallery exhibitions officer Person educated: Patient and Engineer, maintenance (IT) Education method: Explanation, Demonstration, Tactile cues, and Verbal cues Education comprehension: verbalized understanding, returned demonstration, verbal cues required, tactile cues required, and needs further education  HOME EXERCISE PROGRAM: 06/03/23 - splint recommendations   GOALS: Goals reviewed with patient? Yes  SHORT TERM GOALS: Target date: 06/07/23  Patient/caregiver will demonstrate initial RUE PROM/AAROM HEP with 25% verbal cues or less for proper execution. Baseline: New to outpt OT - flaccid RUE Goal status: MET  2.  Patient and family will be aware of home based options for HEP (Neuro Rehab glove, estim, kinesio taping). Baseline: New to outpt OT - flaccid RUE Goal status: MET    LONG TERM GOALS: Target date: 07/05/23  Patient/caregiver will demonstrate updated RUE HEP with handouts only for proper execution. Baseline: New to outpt OT - flaccid RUE Goal status: IN Progress  2.  Caregivers will be able to apply kinesio tape for L shoulder subluxation support 1-2x/week. Baseline: New to outpt OT - flaccid RUE Goal status: INITIAL  3.  Caregivers will be independent with neuro re-ed activities for R UE stimulation and AAROM as deemed available and appropriate by family for home use (ie. Neuro Rehab glove and/or  estim) Baseline: New to outpt OT - flaccid RUE Goal status: IN progress  4.  Caregivers will be able to assist with splint application as needed/tolerated by pt for R thumb ROM and sensory stimulation of R hand and shoulder subluxation as tolerated. Baseline: New to outpt OT - flaccid RUE but some thumb flexion tightness; shoulder subluxation > thumb width Goal status: IN progress  ASSESSMENT:  CLINICAL IMPRESSION: Patient is seen today for occupational therapy treatment for chronic CVA with R flaccid hemiplegia.  Patient tolerated trial of Neuro Rehab glove well today.  She still has significant R UE non-function but had good movement of digits with glove in place.  Daily use is recommended to begin at home with support at wrist and possible wrist cock up splint to help with positioning of hand during use of glove.  She will benefit from continued skilled OT services in the outpatient setting to work on impairments as to help pt establish HEP for carryover at home in hopes of further neuro return over time as able.    PERFORMANCE DEFICITS: in functional skills including ADLs, IADLs, coordination, dexterity, sensation, tone, ROM, strength, Fine motor control, Gross motor control, balance, endurance, decreased knowledge of precautions, decreased knowledge of use of DME, and UE functional use, cognitive skills including memory, problem solving, safety awareness, sequencing, temperament/personality, thought, and understand, and psychosocial skills including coping strategies, environmental adaptation, and interpersonal interactions.   IMPAIRMENTS: are limiting patient from ADLs, IADLs, leisure, and social participation.   CO-MORBIDITIES: may have co-morbidities  that affects occupational performance. Patient will benefit from skilled OT to address above impairments and improve overall function.  REHAB POTENTIAL: Fair due to chronicity of CVA   PLAN:  OT FREQUENCY: 1x/week  OT DURATION: 6  weeks  PLANNED INTERVENTIONS: 97535 self care/ADL training, 96045 therapeutic exercise, 97530 therapeutic activity, 97112 neuromuscular re-education, 97140 manual therapy, [id number] electrical stimulation (manual), 97014 electrical stimulation unattended, 97760 Orthotics management and training, 40981 Splinting (initial encounter), [phone] Subsequent splinting/medication, passive range of motion, coping strategies training, patient/family education, and DME and/or AE instructions  RECOMMENDED OTHER SERVICES: Pt has PT services in place at this time.  CONSULTED AND AGREED WITH PLAN OF CARE: Patient and Caregiver  PLAN FOR NEXT SESSION:  Provide printed handout of HEP of ex's performed today, review/assess remaining LTG's D/C next session   Sheran Lawless, OT 06/24/2023, 3:01 PM

## 2023-07-01 ENCOUNTER — Other Ambulatory Visit: Payer: Managed Care, Other (non HMO)

## 2023-07-01 ENCOUNTER — Ambulatory Visit: Payer: Managed Care, Other (non HMO) | Admitting: Oncology

## 2023-07-03 ENCOUNTER — Ambulatory Visit (HOSPITAL_BASED_OUTPATIENT_CLINIC_OR_DEPARTMENT_OTHER)
Admission: RE | Admit: 2023-07-03 | Discharge: 2023-07-03 | Disposition: A | Discharge status: Home / Self Care | Payer: Managed Care, Other (non HMO) | Source: Ambulatory Visit | Attending: Oncology | Admitting: Oncology

## 2023-07-03 ENCOUNTER — Encounter (HOSPITAL_BASED_OUTPATIENT_CLINIC_OR_DEPARTMENT_OTHER): Payer: Self-pay

## 2023-07-03 DIAGNOSIS — C181 Malignant neoplasm of appendix: Secondary | ICD-10-CM | POA: Diagnosis present

## 2023-07-03 LAB — POCT I-STAT CREATININE: Creatinine, Ser: 1 mg/dL (ref 0.44–1.00)

## 2023-07-03 MED ORDER — IOHEXOL 300 MG/ML  SOLN
100.0000 mL | Freq: Once | INTRAMUSCULAR | Status: AC | PRN
  Administered 2023-07-03: 80 mL via INTRAVENOUS

## 2023-07-09 ENCOUNTER — Ambulatory Visit: Payer: Managed Care, Other (non HMO) | Attending: Family Medicine | Admitting: Occupational Therapy

## 2023-07-09 DIAGNOSIS — I69251 Hemiplegia and hemiparesis following other nontraumatic intracranial hemorrhage affecting right dominant side: Secondary | ICD-10-CM | POA: Insufficient documentation

## 2023-07-09 DIAGNOSIS — M6281 Muscle weakness (generalized): Secondary | ICD-10-CM | POA: Diagnosis present

## 2023-07-09 NOTE — Patient Instructions (Signed)
CAREGIVER ASSISTED: Shoulder Flexion    Caregiver raises arm forwards gently and slowly, keeping one hand just above elbow and other hand supporting wrist/hand. Patient looks at arm. Hold _5__ seconds. _10__ reps per set, _2__ sets per day  Copyright  [employer]. All rights reserved.    Elevation (Active)    Shrug shoulders up, breathing in. Relax, breathing out. Focus on Rt shoulder. Do in front of mirror Repeat _10___ times. Do __2__ sessions per day.  Flexion (Assistive)    Clasp hands together and raise arms above head, keeping elbows as straight as possible. Can be done sitting or lying. Repeat _10___ times. Do ___2_ sessions per day.  SHOULDER: Flexion On Table    Place hands on table (Lt hand over Rt wrist) with folded towel underneath, Slide hands forward and back along table x 10 reps, then side to side x 10 reps. Press hands down into table.  _10__ reps per set, _2__ sets per day

## 2023-07-09 NOTE — Therapy (Signed)
OUTPATIENT OCCUPATIONAL THERAPY NEURO TREATMENT/DISCHARGE  Patient Name: Melanie Madden MRN: [id number] DOB:Feb 20, 1966, 57 y.o., female Today's Date: 07/09/2023  PCP: Sigmund Hazel, MD REFERRING PROVIDER: Cathren Harsh, MD   OCCUPATIONAL THERAPY DISCHARGE SUMMARY  Visits from Start of Care: 6  Current functional level related to goals / functional outcomes: SEE BELOW   Remaining deficits: Flaccid hemiplegia Rt side Aphasia   Education / Equipment: HEP, splint wear and care, training in neuro glove   Patient agrees to discharge. Patient goals were met. Patient is being discharged due to meeting the stated rehab goals..     END OF SESSION:  OT End of Session - 07/09/23 1324     Visit Number 6    Number of Visits 6    Authorization Type [employer] 2024 $50 copay per day; VL; 60 (combined PT/OT/ST) multi D/same day visits = 1 visit Auth Reqd/ Med Review @ 5th visit    Authorization - Number of Visits 5    OT Start Time 1320    OT Stop Time 1350    OT Time Calculation (min) 30 min    Equipment Utilized During Treatment NeuroRehab Glove    Activity Tolerance Patient tolerated treatment well    Behavior During Therapy WFL for tasks assessed/performed   Irritable but cooperative            Past Medical History:  Diagnosis Date   Allergy    Anxiety    Cancer of appendix (HCC) Stage 4    Depression    Expressive aphasia    Hemiparesis (HCC)    Right side   High triglycerides    History of kidney stones    Hypertension    Kidney stones    Recurrent sinus infections    Septic shock (HCC) 03/26/2023   Septic shock (HCC) 02/08/2021   Stroke (HCC) 07/09/2019   Past Surgical History:  Procedure Laterality Date   ANTERIOR CRUCIATE LIGAMENT REPAIR Left    BUNIONECTOMY Right    CYSTOSCOPY/URETEROSCOPY/HOLMIUM LASER/STENT PLACEMENT Right 02/08/2021   Procedure: CYSTOSCOPY/RETROGRADE/URETEROSCOPY/HOLMIUM LASER/STENT PLACEMENT, NEPHROSTOMY TUBE REMOVAL;   Surgeon: Rene Paci, MD;  Location: [facility];  Service: Urology;  Laterality: Right;   CYSTOSCOPY/URETEROSCOPY/HOLMIUM LASER/STENT PLACEMENT Bilateral 04/10/2023   Procedure: CYSTOSCOPY, BILATERAL URETEROSCOPY, BILATERAL RETROGRADE PYELOGRAM, HOLMIUM LASER LITHOTRIPSY, AND BILATERAL URETERAL STENT PLACEMENT, removal of percutanous stent post op;  Surgeon: Rene Paci, MD;  Location: [facility];  Service: Urology;  Laterality: Bilateral;  90 MINUTES   IR NEPHROSTOMY PLACEMENT RIGHT  12/09/2020   IR NEPHROSTOMY PLACEMENT RIGHT  03/27/2023   LASIK  2008   LIPOSUCTION     LITHOTRIPSY     MENISCUS REPAIR Left    NASAL SEPTUM SURGERY     ROBOTIC ASSISTED TOTAL HYSTERECTOMY WITH BILATERAL SALPINGO OOPHERECTOMY Bilateral 12/06/2021   Procedure: XI ROBOTIC ASSISTED BILATERAL SALPINGO OOPHORECTOMY, TOTAL HYSTERECTOMY, PERITONEAL BIOPSY, OMENTECTOMY, LAPAROTOMY, APPENDECTOMY, PAP SMEAR;  Surgeon: Carver Fila, MD;  Location: [facility];  Service: Gynecology;  Laterality: Bilateral;   SHOULDER SURGERY Right    VARICOSE VEIN SURGERY Left    Patient Active Problem List   Diagnosis Date Noted   Sepsis secondary to UTI (HCC) 03/29/2023   Obstruction of right ureteropelvic junction (UPJ) due to stone 03/29/2023   Hydronephrosis 03/29/2023   Bacteremia 03/29/2023   Stroke (cerebrum) (HCC) 03/29/2023   Ovarian mass 12/06/2021   Pelvic mass in female 11/06/2021   Urinary retention    E. 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   Nontraumatic subcortical hemorrhage of left cerebral hemisphere (HCC) 08/27/2019   Acute blood loss anemia    Hypoalbuminemia due to protein-calorie malnutrition (HCC)    Thrombocytopenia (HCC)    Dysphagia  07/29/2019   Infarction of left basal ganglia (HCC) 07/18/2019   Hypernatremia    Leukocytosis    Essential hypertension    Global aphasia    Cytotoxic brain edema (HCC) 07/10/2019   ICH (intracerebral hemorrhage) (HCC) 07/09/2019   Anxiety state 02/21/2015   Cephalalgia 12/21/2014    ONSET DATE: 04/01/2023  REFERRING DIAG: Hemiplegia and hemiparesis following nontraumatic intracerebral hemorrhage affecting right dominant side (HCC)  THERAPY DIAG:  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 coli UTI with obstructing right renal stone, appendiceal adenocarcinoma stage IV, diagnosed on TAH/BSO in 11/2021  Patient hospitalized due to urosepsis from an obstructing 15 mm right UPJ stone. She underwent right-sided nephrostomy tube on 03/27/2023   PRECAUTIONS: None  WEIGHT BEARING RESTRICTIONS: No  PAIN:  Are you having pain? No  FALLS: Has patient fallen in last 6 months? No  Lives with: lives with their spouse; caregiver (CNA) present everyday 5am until her husband gets home Lives in: House/apartment Stairs: Yes: Internal: 16 steps; can reach both and External: 2 steps; none Has following equipment at home: Quad cane small base, Environmental consultant - 4 wheeled, Wheelchair (manual), Shower bench, and chair lift  PLOF: Independent - Prior to 2021  PATIENT GOALS: Points to R side when asked.  OBJECTIVE:  Note: Objective measures were completed at Evaluation unless otherwise noted.  HAND DOMINANCE: Ambidextrous - previously.  ADLs: Overall ADLs: Pt indicates she does it herself and caregiver  confirms this by reports of husband also. Transfers/ambulation related to ADLs: Eating: Ind with L hand Grooming: Help with hair as needed UB Dressing: Pt reports she dresses herself - doesn't do buttons  LB Dressing: Dons socks, shoes and brace Toileting: Ind Bathing: Pt reports showering herself and transferring on her own Tub Shower transfers: Mod I with shower bench Equipment: Transfer tub bench  IADLs: Shopping: Spouse or caregivers Light housekeeping: Caregivers Meal Prep: Caregivers Community mobility: Supervised with quad cane Medication management: Husband Financial management: Husband Handwriting:  NT  MOBILITY STATUS: Independent and difficulty carrying objections with ambulation  POSTURE COMMENTS:  forward head and Flaccid RUE Sitting balance: No   ACTIVITY TOLERANCE: Activity tolerance: Fair, reliant on quad cane for all mobility, slow pace  FUNCTIONAL OUTCOME MEASURES: NT  UPPER EXTREMITY ROM:    ROM Right Eval - PROM Left Eval -  AROM  Shoulder flexion 85 WNL   Shoulder abduction 75   Shoulder adduction    Shoulder extension    Shoulder internal rotation    Shoulder external rotation    Elbow flexion WNL   Elbow extension WNL   Wrist flexion WNL   Wrist extension WNL   Wrist ulnar deviation  Wrist radial deviation    Wrist pronation    Wrist supination    (Blank rows = not tested)  UPPER EXTREMITY MMT:     MMT Right eval Left eval  Shoulder flexion Generally 0 to 1(flickers) mostly with associate reactions (yawning - elbow flexes) and slight finger flexion/grip 4  Shoulder abduction    Shoulder adduction    Shoulder extension    Shoulder internal rotation    Shoulder external rotation    Middle trapezius    Lower trapezius    Elbow flexion    Elbow extension    Wrist flexion    Wrist extension    Wrist ulnar deviation    Wrist radial deviation    Wrist pronation    Wrist supination    (Blank rows = not tested)  HAND  FUNCTION: L - WFL grip 57.9, 47.3, 47.6 - Average R - 50.9 lbs R - NA  COORDINATION: L - WFL  R - NA  SENSATION: R side feels "different"  EDEMA: Pt reports NA  MUSCLE TONE: RUE: Flaccid  COGNITION: Overall cognitive status: Difficulty to assess due to: Communication impairment  VISION: Subjective report: Pt preports no difficulty Baseline vision: No visual deficits Visual history: NA  VISION ASSESSMENT: Not tested  Patient has difficulty with following activities due to following visual impairments: None reported  PERCEPTION: Not tested  PRAXIS: Not tested  OBSERVATIONS: Pt ambulates with quad cane with no loss of balance but some difficulty following directions to step up to the table so OTR could bring the chair behind her to sit.  She has very flaccid R UE that simple hangs during ambulation and has a significant subluxation.  Pt has aphasia but is able to articulate a few individual words - yes/no etc.  Pt needs extra time for sit to stand with some stability of chair to get up.     TODAY'S TREATMENT:                                                                                                                                Pt's husband called prior to last O.T. appointment to ask for a better option for soft ankle support brace to wear at night for safety. Explained to husband that this is outside my scope of practice and P.T. has already d/c patient. Recommended using BSC at night for safety to prevent falls, however pt's husband informed therapist that she refuses to use it. This therapist discussed with husband conveying concerns to P.T. but unsure if they will be able to make further recommendations.  (Later O.T. discussed w/ primary P.T. and P.T. informed O.T. that they have already had this conversation with pt/husband and had made recommendations, but pt refused to wear)  Pt seen today to review and issue printed handouts on HEP shown last session - see pt  instructions for details. Pt return demo of each with cues to slow down and  perform gently. Also instructed caregiver on proper hand placement for caregiver assisted sh flexion.   Assessed remaining goals - see below  PATIENT EDUCATION: Education details: HEP for RUE Person educated: Patient and Engineer, maintenance (IT) Education method: Explanation, Demonstration, Tactile cues, and Verbal cues Education comprehension: verbalized understanding, returned demonstration, verbal cues required, tactile cues required, and needs further education  HOME EXERCISE PROGRAM: 06/03/23 - splint recommendations 07/09/23 - HEP for RUE   GOALS: Goals reviewed with patient? Yes  SHORT TERM GOALS: Target date: 06/07/23  Patient/caregiver will demonstrate initial RUE PROM/AAROM HEP with 25% verbal cues or less for proper execution. Baseline: New to outpt OT - flaccid RUE Goal status: MET  2.  Patient and family will be aware of home based options for HEP (Neuro Rehab glove, estim, kinesio taping). Baseline: New to outpt OT - flaccid RUE Goal status: MET    LONG TERM GOALS: Target date: 07/05/23  Patient/caregiver will demonstrate updated RUE HEP with handouts only for proper execution. Baseline: New to outpt OT - flaccid RUE Goal status: MET  2.  Caregivers will be able to apply kinesio tape for L shoulder subluxation support 1-2x/week. Baseline: New to outpt OT - flaccid RUE Goal status: DEFERRING d/t pt's decreased sensation and inability to verbally communicate  3.  Caregivers will be independent with neuro re-ed activities for R UE stimulation and AAROM as deemed available and appropriate by family for home use (ie. Neuro Rehab glove and/or estim) Baseline: New to outpt OT - flaccid RUE Goal status: MET   4.  Caregivers will be able to assist with splint application as needed/tolerated by pt for R thumb ROM and sensory stimulation of R hand and shoulder subluxation as tolerated. Baseline: New to outpt  OT - flaccid RUE but some thumb flexion tightness; shoulder subluxation > thumb width Goal status: MET   ASSESSMENT:  CLINICAL IMPRESSION: Patient is seen today for occupational therapy treatment for chronic CVA with R flaccid hemiplegia.  Patient has met all goals at this time and has reached max rehab potential. Limited by aphasia, severe flaccid hemiplegia, and behavioral episodes.   PERFORMANCE DEFICITS: in functional skills including ADLs, IADLs, coordination, dexterity, sensation, tone, ROM, strength, Fine motor control, Gross motor control, balance, endurance, decreased knowledge of precautions, decreased knowledge of use of DME, and UE functional use, cognitive skills including memory, problem solving, safety awareness, sequencing, temperament/personality, thought, and understand, and psychosocial skills including coping strategies, environmental adaptation, and interpersonal interactions.   IMPAIRMENTS: are limiting patient from ADLs, IADLs, leisure, and social participation.   CO-MORBIDITIES: may have co-morbidities  that affects occupational performance. Patient will benefit from skilled OT to address above impairments and improve overall function.  REHAB POTENTIAL: Fair due to chronicity of CVA   PLAN:  OT FREQUENCY: 1x/week  OT DURATION: 6 weeks  PLANNED INTERVENTIONS: 97535 self care/ADL training, 16109 therapeutic exercise, 97530 therapeutic activity, 97112 neuromuscular re-education, 97140 manual therapy, [id number] electrical stimulation (manual), 97014 electrical stimulation unattended, 97760 Orthotics management and training, 60454 Splinting (initial encounter), [id number] Subsequent splinting/medication, passive range of motion, coping strategies training, patient/family education, and DME and/or AE instructions  RECOMMENDED OTHER SERVICES: NONE  CONSULTED AND AGREED WITH PLAN OF CARE: Patient and Caregiver  PLAN  D/C O.T.   Sheran Lawless, OT 07/09/2023, 1:55  PM

## 2023-07-11 ENCOUNTER — Inpatient Hospital Stay: Payer: Managed Care, Other (non HMO)

## 2023-07-11 ENCOUNTER — Inpatient Hospital Stay: Payer: Managed Care, Other (non HMO) | Attending: Oncology | Admitting: Oncology

## 2023-07-11 VITALS — BP 130/83 | HR 81 | Temp 98.1°F | Resp 18 | Ht 67.0 in

## 2023-07-11 DIAGNOSIS — G8191 Hemiplegia, unspecified affecting right dominant side: Secondary | ICD-10-CM | POA: Diagnosis not present

## 2023-07-11 DIAGNOSIS — C181 Malignant neoplasm of appendix: Secondary | ICD-10-CM | POA: Diagnosis present

## 2023-07-11 DIAGNOSIS — R4701 Aphasia: Secondary | ICD-10-CM | POA: Insufficient documentation

## 2023-07-11 DIAGNOSIS — Z79899 Other long term (current) drug therapy: Secondary | ICD-10-CM | POA: Insufficient documentation

## 2023-07-11 LAB — BASIC METABOLIC PANEL - CANCER CENTER ONLY
Anion gap: 10 (ref 5–15)
BUN: 16 mg/dL (ref 6–20)
CO2: 25 mmol/L (ref 22–32)
Calcium: 10.2 mg/dL (ref 8.9–10.3)
Chloride: 103 mmol/L (ref 98–111)
Creatinine: 0.94 mg/dL (ref 0.44–1.00)
GFR, Estimated: >60 mL/min (ref >60)
Glucose, Bld: 109 mg/dL — ABNORMAL HIGH (ref 70–99)
Potassium: 3.7 mmol/L (ref 3.5–5.1)
Sodium: 138 mmol/L (ref 135–145)

## 2023-07-11 LAB — CEA (ACCESS): CEA (CHCC): 2.59 ng/mL (ref 0.00–5.00)

## 2023-07-11 NOTE — Progress Notes (Signed)
  [facility] OFFICE PROGRESS NOTE   Diagnosis: Appendix carcinoma  INTERVAL HISTORY:   [name] returns as scheduled.  She is here with her caretaker.  Good appetite.  No difficulty with bowel or bladder function.  No pain.  She complains of increased abdominal wall fat.  Objective:  Vital signs in last 24 hours:  Blood pressure 130/83, pulse 81, temperature 98.1 F (36.7 C), temperature source Temporal, resp. rate 18, height 5\' 7"  (1.702 m), SpO2 99%.    GI: The abdomen is soft.  No hepatosplenomegaly, no masses, no apparent ascites.  Prominent fatty tissue over the mid and lower abdomen   The patient would not allow further examination Lab Results:  Lab Results  Component Value Date   WBC 6.4 04/01/2023   HGB 11.1 (L) 04/01/2023   HCT 33.7 (L) 04/01/2023   MCV 86.2 04/01/2023   PLT 202 04/01/2023   NEUTROABS 7.3 03/26/2023    CMP  Lab Results  Component Value Date   NA 134 (L) 04/01/2023   K 3.9 04/01/2023   CL 101 04/01/2023   CO2 22 04/01/2023   GLUCOSE 131 (H) 04/01/2023   BUN 18 04/01/2023   CREATININE 1.00 07/03/2023   CALCIUM 9.6 04/01/2023   PROT 6.6 01/21/2023   ALBUMIN 3.5 01/21/2023   AST 16 01/21/2023   ALT 20 01/21/2023   ALKPHOS 73 01/21/2023   BILITOT 0.5 01/21/2023   GFRNONAA >60 04/01/2023   GFRAA >60 12/19/2019    Lab Results  Component Value Date   CEA1 2.90 11/06/2021   CEA 2.59 07/11/2023    Medications: I have reviewed the patient's current medications.   Assessment/Plan: Goblet cell adenocarcinoma of the appendix, stage IV, (pT4a,pNx,pM1c) 12/06/2021 TAH/BSO, omentum resection, cul-de-sac biopsy-metastatic poorly differentiated carcinoma with signet ring cells involving bilateral ovaries, the omentum, anterior cul-de-sac biopsy suspicious for malignancy.  Resection margins negative. Foundation 1-microsatellite status cannot be determined, tumor mutation burden cannot be determined, [id number],  KMT2D(MLL2) MRI abdomen and pelvis 10/26/2021-new large bilateral adnexal masses concerning for bilateral ovarian neoplasm, stable 4 cm left adrenal gland lesion consistent with a benign adenoma, no evidence of omental disease, no adenopathy PET 01/17/2022-no evidence of residual or recurrent appendiceal carcinoma, small cutaneous nodule at the left posterior thorax with mild hypermetabolism, left thyroid goiter, 4 cm benign left adrenal adenoma CTs 08/06/2022-no evidence of progressive disease, fluid adjacent to the gallbladder fundus CTs 07/03/2023: No evidence of local recurrence or metastatic disease, stable small rim of fluid at the gallbladder fundus 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   Nephrolithiasis 4.   Admission 12/09/2020 with urosepsis secondary to obstructing right renal stone, status post percutaneous nephrostomy placement Cystoscopy with laser lithotripsy and right JJ stent placement 02/08/2021     Disposition: [name] remains in clinical remission from the appendix carcinoma.  She agrees to a follow-up visit and surveillance imaging in 6 months.  She will call in the interim for new symptoms.  The prominent abdominal wall fat appears to be a benign finding.    Thornton Papas, MD  07/11/2023  12:54 PM

## 2023-09-19 ENCOUNTER — Telehealth: Payer: Self-pay | Admitting: *Deleted

## 2023-09-19 NOTE — Telephone Encounter (Signed)
Called husband w/CT scan appointment in June. Confirmed his address and appointment calendar mailed to home.

## 2023-11-27 ENCOUNTER — Encounter (HOSPITAL_BASED_OUTPATIENT_CLINIC_OR_DEPARTMENT_OTHER): Payer: Self-pay

## 2023-11-27 ENCOUNTER — Ambulatory Visit (HOSPITAL_BASED_OUTPATIENT_CLINIC_OR_DEPARTMENT_OTHER)
Admission: RE | Admit: 2023-11-27 | Discharge: 2023-11-27 | Disposition: A | Discharge status: Home / Self Care | Source: Ambulatory Visit | Attending: Physician Assistant | Admitting: Physician Assistant

## 2023-11-27 ENCOUNTER — Other Ambulatory Visit (HOSPITAL_COMMUNITY): Payer: Self-pay | Admitting: Physician Assistant

## 2023-11-27 DIAGNOSIS — Z8509 Personal history of malignant neoplasm of other digestive organs: Secondary | ICD-10-CM | POA: Insufficient documentation

## 2023-11-27 DIAGNOSIS — R1031 Right lower quadrant pain: Secondary | ICD-10-CM | POA: Diagnosis present

## 2023-11-27 MED ORDER — IOHEXOL 300 MG/ML  SOLN
100.0000 mL | Freq: Once | INTRAMUSCULAR | Status: AC | PRN
Start: 2023-11-27 — End: 2023-11-27
  Administered 2023-11-27: 100 mL via INTRAVENOUS

## 2023-11-28 ENCOUNTER — Emergency Department (HOSPITAL_COMMUNITY)

## 2023-11-28 ENCOUNTER — Encounter (HOSPITAL_COMMUNITY): Payer: Self-pay

## 2023-11-28 ENCOUNTER — Other Ambulatory Visit: Payer: Self-pay

## 2023-11-28 ENCOUNTER — Emergency Department (HOSPITAL_COMMUNITY)
Admission: EM | Admit: 2023-11-28 | Discharge: 2023-11-28 | Disposition: A | Discharge status: Home / Self Care | Attending: Emergency Medicine | Admitting: Emergency Medicine

## 2023-11-28 DIAGNOSIS — I10 Essential (primary) hypertension: Secondary | ICD-10-CM | POA: Diagnosis not present

## 2023-11-28 DIAGNOSIS — R1031 Right lower quadrant pain: Secondary | ICD-10-CM | POA: Diagnosis present

## 2023-11-28 DIAGNOSIS — Z79899 Other long term (current) drug therapy: Secondary | ICD-10-CM | POA: Insufficient documentation

## 2023-11-28 DIAGNOSIS — N3001 Acute cystitis with hematuria: Secondary | ICD-10-CM | POA: Insufficient documentation

## 2023-11-28 LAB — CBC
HCT: 47.9 % — ABNORMAL HIGH (ref 36.0–46.0)
Hemoglobin: 15.3 g/dL — ABNORMAL HIGH (ref 12.0–15.0)
MCH: 29 pg (ref 26.0–34.0)
MCHC: 31.9 g/dL (ref 30.0–36.0)
MCV: 90.7 fL (ref 80.0–100.0)
Platelets: 192 10*3/uL (ref 150–400)
RBC: 5.28 MIL/uL — ABNORMAL HIGH (ref 3.87–5.11)
RDW: 14.2 % (ref 11.5–15.5)
WBC: 6.6 10*3/uL (ref 4.0–10.5)
nRBC: 0 % (ref 0.0–0.2)

## 2023-11-28 LAB — COMPREHENSIVE METABOLIC PANEL WITH GFR
ALT: 21 U/L (ref 0–44)
AST: 21 U/L (ref 15–41)
Albumin: 4.1 g/dL (ref 3.5–5.0)
Alkaline Phosphatase: 98 U/L (ref 38–126)
Anion gap: 10 (ref 5–15)
BUN: 19 mg/dL (ref 6–20)
CO2: 24 mmol/L (ref 22–32)
Calcium: 9.9 mg/dL (ref 8.9–10.3)
Chloride: 104 mmol/L (ref 98–111)
Creatinine, Ser: 1.06 mg/dL — ABNORMAL HIGH (ref 0.44–1.00)
GFR, Estimated: >60 mL/min (ref >60)
Glucose, Bld: 149 mg/dL — ABNORMAL HIGH (ref 70–99)
Potassium: 3.9 mmol/L (ref 3.5–5.1)
Sodium: 138 mmol/L (ref 135–145)
Total Bilirubin: 0.9 mg/dL (ref 0.0–1.2)
Total Protein: 7.3 g/dL (ref 6.5–8.1)

## 2023-11-28 LAB — URINALYSIS, ROUTINE W REFLEX MICROSCOPIC
Bilirubin Urine: NEGATIVE
Glucose, UA: NEGATIVE mg/dL
Ketones, ur: NEGATIVE mg/dL
Nitrite: NEGATIVE
Protein, ur: 30 mg/dL — AB
RBC / HPF: >50 RBC/hpf (ref 0–5)
Specific Gravity, Urine: 1.014 (ref 1.005–1.030)
pH: 7 (ref 5.0–8.0)

## 2023-11-28 LAB — LIPASE, BLOOD: Lipase: 30 U/L (ref 11–51)

## 2023-11-28 MED ORDER — MORPHINE SULFATE (PF) 4 MG/ML IV SOLN
4.0000 mg | Freq: Once | INTRAVENOUS | Status: AC
  Administered 2023-11-28: 4 mg via INTRAMUSCULAR
  Filled 2023-11-28: qty 1

## 2023-11-28 MED ORDER — CIPROFLOXACIN HCL 500 MG PO TABS: 500.0000 mg | ORAL_TABLET | Freq: Two times a day (BID) | ORAL | 0 refills | Status: DC

## 2023-11-28 MED ORDER — MORPHINE SULFATE (PF) 4 MG/ML IV SOLN
4.0000 mg | Freq: Once | INTRAVENOUS | Status: DC
  Filled 2023-11-28: qty 1

## 2023-11-28 MED ORDER — CEPHALEXIN 500 MG PO CAPS: 500.0000 mg | ORAL_CAPSULE | Freq: Two times a day (BID) | ORAL | 0 refills | Status: DC

## 2023-11-28 NOTE — ED Notes (Signed)
 Pt unable to provide urine at this time

## 2023-11-28 NOTE — Discharge Instructions (Addendum)
 You were evaluated in the emergency room for abdominal pain your lab work and imaging did not show any significant abnormality.  Your CT scan performed yesterday was suggestive of infectious versus inflammatory colitis.  Your urine was suggestive of a UTI.  I have sent a prescription for cephalexin  into your pharmacy please be sure to complete the full course of antibiotics and set up an appointment with your primary care doctor for further management.

## 2023-11-28 NOTE — ED Provider Notes (Addendum)
 [place] EMERGENCY DEPARTMENT AT [facility] Provider Note   CSN: [id number] Arrival date & time: 11/28/23  1054     History  Chief Complaint  Patient presents with   Abdominal Pain    Melanie Madden is a 58 y.o. 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  Pain is localized to her right lower quadrant.  Is not associated with vomiting, diarrhea, urinary or vaginal symptoms.  Has a history of appendectomy and full hysterectomy with bilateral salpingectomy and oophorectomy.   Abdominal Pain  Past Surgical History:  Procedure Laterality Date   ANTERIOR CRUCIATE LIGAMENT REPAIR Left    BUNIONECTOMY Right    CYSTOSCOPY/URETEROSCOPY/HOLMIUM LASER/STENT PLACEMENT Right 02/08/2021   Procedure: CYSTOSCOPY/RETROGRADE/URETEROSCOPY/HOLMIUM LASER/STENT PLACEMENT, NEPHROSTOMY TUBE REMOVAL;  Surgeon: Adelbert Homans, MD;  Location: [facility];  Service: Urology;  Laterality: Right;   CYSTOSCOPY/URETEROSCOPY/HOLMIUM LASER/STENT PLACEMENT Bilateral 04/10/2023   Procedure: CYSTOSCOPY, BILATERAL URETEROSCOPY, BILATERAL RETROGRADE PYELOGRAM, HOLMIUM LASER LITHOTRIPSY, AND BILATERAL URETERAL STENT PLACEMENT, removal of percutanous stent post op;  Surgeon: Adelbert Homans, MD;  Location: [facility];  Service: Urology;  Laterality: Bilateral;  90 MINUTES   IR NEPHROSTOMY PLACEMENT RIGHT  12/09/2020   IR NEPHROSTOMY PLACEMENT RIGHT  03/27/2023   LASIK  2008   LIPOSUCTION     LITHOTRIPSY     MENISCUS REPAIR Left    NASAL SEPTUM SURGERY     ROBOTIC ASSISTED TOTAL HYSTERECTOMY WITH BILATERAL SALPINGO OOPHERECTOMY Bilateral 12/06/2021   Procedure: XI ROBOTIC ASSISTED BILATERAL SALPINGO OOPHORECTOMY, TOTAL HYSTERECTOMY, PERITONEAL BIOPSY, OMENTECTOMY, LAPAROTOMY, APPENDECTOMY, PAP SMEAR;  Surgeon: Suzi Essex, MD;   Location: [facility];  Service: Gynecology;  Laterality: Bilateral;   SHOULDER SURGERY Right    VARICOSE VEIN SURGERY Left    Past Medical History:  Diagnosis Date   Allergy    Anxiety    Cancer of appendix (HCC) Stage 4    Depression    Expressive aphasia    Hemiparesis (HCC)    Right side   High triglycerides    History of kidney stones    Hypertension    Kidney stones    Recurrent sinus infections    Septic shock (HCC) 03/26/2023   Septic shock (HCC) 02/08/2021   Stroke (HCC) 07/09/2019       Home Medications Prior to Admission medications   Medication Sig Start Date End Date Taking? Authorizing Provider  ciprofloxacin  (CIPRO ) 500 MG tablet Take 1 tablet (500 mg total) by mouth every 12 (twelve) hours. 11/28/23  Yes Felicie Horning, PA-C  amLODipine  (NORVASC ) 10 MG tablet Take 1 tablet (10 mg total) by mouth daily. Hold until follow-up with your doctor 04/01/23   Rai, Hurman Maiden, MD  atorvastatin  (LIPITOR) 20 MG tablet Take 1 tablet (20 mg total) by mouth daily at 6 PM. Patient taking differently: Take 20 mg by mouth daily. 08/20/19   Angiulli, Everlyn Hockey, PA-C  B Complex-C (B-COMPLEX WITH VITAMIN C) tablet Take 1 tablet by mouth daily.    [provider]  Bisacodyl  ([employer] C-LAX LAXATIVE PO) Take by mouth as needed (constiipation).    [provider]  CALCIUM  PO Take 1 tablet by mouth daily.    [provider]  Cholecalciferol (VITAMIN D) 50 MCG (2000 UT) CAPS Take 2,000 Units by mouth daily.    [provider]  DULoxetine  (CYMBALTA ) 20 MG capsule Take 1 capsule (20 mg  total) by mouth in the morning. Patient not taking: Reported on 04/05/2023 04/01/23   Rai, Hurman Maiden, MD  ferrous sulfate 325 (65 FE) MG EC tablet Take 325 mg by mouth daily with breakfast.    [provider]  gabapentin  (NEURONTIN ) 100 MG capsule 100 mg at bedtime. 02/27/23   [provider]  losartan (COZAAR) 25 MG tablet Take 25 mg by mouth daily. 11/25/23   [provider]  magnesium  30 MG tablet Take 30 mg by mouth daily.    [provider]  Omega-3 Fatty Acids (FISH OIL PO) Take by mouth.    [provider]  oxybutynin  (DITROPAN ) 5 MG tablet Take 1 tablet (5 mg total) by mouth every 8 (eight) hours as needed for bladder spasms. 04/10/23   Adelbert Homans, MD  traMADol  (ULTRAM ) 50 MG tablet Take 1 tablet (50 mg total) by mouth every 12 (twelve) hours as needed for moderate pain or severe pain. 04/01/23   Rai, Ripudeep K, MD  VITAMIN E PO Take 1 tablet by mouth daily.    [provider]  zinc gluconate 50 MG tablet Take 50 mg by mouth daily.    [provider]      Allergies    Amoxicillin and Shellfish allergy    Review of Systems   Review of Systems  Gastrointestinal:  Positive for abdominal pain.    Physical Exam Updated Vital Signs BP (!) 156/108 (BP Location: Left Arm)   Pulse 98   Temp 99.3 F (37.4 C) (Oral)   Ht 5\' 7"  (1.702 m)   Wt 66.7 kg   SpO2 98%   BMI 23.02 kg/m  Physical Exam Vitals and nursing note reviewed.  Constitutional:      General: She is not in acute distress.    Appearance: She is well-developed.  HENT:     Head: Normocephalic and atraumatic.  Eyes:     Conjunctiva/sclera: Conjunctivae normal.  Cardiovascular:     Rate and Rhythm: Normal rate and regular rhythm.     Heart sounds: No murmur heard. Pulmonary:     Effort: Pulmonary effort is normal. No respiratory distress.     Breath sounds: Normal breath sounds.  Abdominal:     Palpations: Abdomen is soft.     Tenderness: There is abdominal tenderness.     Comments: Right lower and upper quadrant tenderness, mild right CVAT, abdomen soft  Musculoskeletal:        General: No swelling.     Cervical back: Neck supple.  Skin:    General: Skin is warm and dry.     Capillary Refill: Capillary refill takes less than 2 seconds.  Neurological:     Mental Status: She is alert. Mental status is at baseline.   Psychiatric:        Mood and Affect: Mood normal.     ED Results / Procedures / Treatments   Labs (all labs ordered are listed, but only abnormal results are displayed) Labs Reviewed  COMPREHENSIVE METABOLIC PANEL WITH GFR - Abnormal; Notable for the following components:      Result Value   Glucose, Bld 149 (*)    Creatinine, Ser 1.06 (*)    All other components within normal limits  CBC - Abnormal; Notable for the following components:   RBC 5.28 (*)    Hemoglobin 15.3 (*)    HCT 47.9 (*)    All other components within normal limits  URINALYSIS, ROUTINE W REFLEX MICROSCOPIC - Abnormal;  Notable for the following components:   APPearance HAZY (*)    Hgb urine dipstick MODERATE (*)    Protein, ur 30 (*)    Leukocytes,Ua LARGE (*)    Bacteria, UA RARE (*)    All other components within normal limits  LIPASE, BLOOD    EKG None  Radiology US  Abdomen Limited RUQ (LIVER/GB) Result Date: 11/28/2023 CLINICAL DATA:  Right upper quadrant abdominal pain. History of appendiceal carcinoma. EXAM: ULTRASOUND ABDOMEN LIMITED RIGHT UPPER QUADRANT COMPARISON:  CT 11/27/2023. FINDINGS: Gallbladder: No gallstones or wall thickening visualized. No sonographic Murphy sign noted by sonographer. Common bile duct: Diameter: 4 mm Liver: No focal lesion identified. Within normal limits in parenchymal echogenicity. Portal vein is patent on color Doppler imaging with normal direction of blood flow towards the liver. Other: None. IMPRESSION: No gallstones or ductal dilatation. Electronically Signed   By: Adrianna Horde M.D.   On: 11/28/2023 15:01   CT ABDOMEN PELVIS W CONTRAST Result Date: 11/27/2023 CLINICAL DATA:  Right-sided abdominal pain for 4 days. EXAM: CT ABDOMEN AND PELVIS WITH CONTRAST TECHNIQUE: Multidetector CT imaging of the abdomen and pelvis was performed using the standard protocol following bolus administration of intravenous contrast. RADIATION DOSE REDUCTION: This exam was performed  according to the departmental dose-optimization program which includes automated exposure control, adjustment of the mA and/or kV according to patient size and/or use of iterative reconstruction technique. CONTRAST:  OMNIPAQUE  IOHEXOL  300 MG/ML  SOLN COMPARISON:  CT of the chest, abdomen and pelvis 07/03/2023 FINDINGS: Lower chest: The lung bases are clear. Heart size is normal. No significant pleural or pericardial effusion is present. Hepatobiliary: No focal liver abnormality is seen. No gallstones, gallbladder wall thickening, or biliary dilatation. Pancreas: Unremarkable. No pancreatic ductal dilatation or surrounding inflammatory changes. Spleen: Normal in size without focal abnormality. Adrenals/Urinary Tract: A 4.2 cm left adrenal adenoma is stable. Right adrenal gland is normal. Scarring is present in the kidneys bilaterally. Bilateral nephrolithiasis is similar the prior study. No obstruction present. The ureters are within normal limits bilaterally. The urinary bladder is normal. Stomach/Bowel: The stomach and duodenum are normal. Small bowel is unremarkable. The terminal ileum is normal. Appendix is not discretely visualized. It may be surgically absent. Minimal stranding is present about the mesentery anterior to the proximal transverse colon. No colonic wall thickening or fluid collection is present. No free air is present. The remainder of the transverse colon is within normal limits. The descending and sigmoid colon are normal. Vascular/Lymphatic: Minimal atherosclerotic calcifications are present. No significant adenopathy is present. Reproductive: Status post hysterectomy. No adnexal masses. Other: No abdominal wall hernia or abnormality. No abdominopelvic ascites. Musculoskeletal: A superior endplate fracture at L3 is similar the prior study. Progressive inferior endplate Schmorl's node is present on the right at L2. Marrow signal and vertebral body heights are otherwise normal. Bony pelvis  is within normal limits. The hips are located and normal. IMPRESSION: 1. Minimal stranding about the mesentery anterior to the proximal transverse colon. This is nonspecific but may represent a mild infectious or inflammatory colitis. 2. Stable 4.2 cm left adrenal adenoma. 3. Stable bilateral nephrolithiasis without obstruction. 4. Stable superior endplate fracture at L3. 5. Progressive inferior endplate Schmorl's node on the right at L2. Electronically Signed   By: Audree Leas M.D.   On: 11/27/2023 15:33    Procedures Procedures    Medications Ordered in ED Medications  morphine  (PF) 4 MG/ML injection 4 mg (4 mg Intramuscular Given 11/28/23 1430)  ED Course/ Medical Decision Making/ A&P Clinical Course as of 11/28/23 1551  Thu Nov 28, 2023  1546 Urinalysis, Routine w reflex microscopic -Urine, Clean Catch [JT]    Clinical Course User Index [JT] Felicie Horning, PA-C                                 Medical Decision Making Amount and/or Complexity of Data Reviewed Labs: ordered. Radiology: ordered.  Risk Prescription drug management.   This patient presents to the ED with chief complaint(s) of abdominal pain .  The complaint involves an extensive differential diagnosis and also carries with it a high risk of complications and morbidity.   Pertinent past medical history as listed in HPI  The differential diagnosis includes  Nephrolithiasis, cystitis, pyelonephritis Additional history obtained: Records reviewed Care Everywhere/External Records  Assessment and management:   Hemodynamically stable, afebrile, nontoxic-appearing patient presenting with 5 days worth of abdominal pain.  Pain is not associated with any nausea, vomiting, diarrhea, vaginal bleeding, discharge or urinary symptoms.  On exam patient has tenderness to right lower and upper quadrants with mild right CVAT.  Patient has a history of appendectomy and full hysterectomy with bilateral salpingectomy and  oophorectomy.  She had a outpatient CT abdomen the pelvis with contrast completed yesterday that demonstrated nonspecific minimal stranding about the mesentery anterior to the proximal transverse colon with stable bilateral nephrolithiasis without obstruction.  Lab work today is overall reassuring and without any significant abnormality.  Will obtain right upper quadrant ultrasound.  Ultrasound with no findings of cholecystitis.  UA suggestive of UTI.  Will send in Keflex  to cover for UTI Independent ECG interpretation:  none  Independent labs interpretation:  The following labs were independently interpreted:  CMP and CBC without significant abnormality, lipase within normal limits, UA with modereate hgb, large leukocytes, 21-50 WBCs  Independent visualization and interpretation of imaging: I independently visualized the following imaging with scope of interpretation limited to determining acute life threatening conditions related to emergency care:  Outpatient CT abdomen pelvis with minimal nonspecific stranding about the mesentery anterior to the proximal transverse colon can represent infectious or inflammatory colitis, stable bilateral nephrolithiasis without obstruction Right upper quadrant ultrasound without any acute findings   Consultations obtained:   none  Disposition:   Patient will be discharged home. The patient has been appropriately medically screened and/or stabilized in the ED. I have low suspicion for any other emergent medical condition which would require further screening, evaluation or treatment in the ED or require inpatient management. At time of discharge the patient is hemodynamically stable and in no acute distress. I have discussed work-up results and diagnosis with patient and answered all questions. Patient is agreeable with discharge plan. We discussed strict return precautions for returning to the emergency department and they verbalized understanding.      Social Determinants of Health:   none  This note was dictated with voice recognition software.  Despite best efforts at proofreading, errors may have occurred which can change the documentation meaning.          Final Clinical Impression(s) / ED Diagnoses Final diagnoses:  Right lower quadrant abdominal pain  Acute cystitis with hematuria    Rx / DC Orders ED Discharge Orders          Ordered    ciprofloxacin  (CIPRO ) 500 MG tablet  Every 12 hours        11/28/23 1510  Felicie Horning, PA-C 11/28/23 1510    Albertus Hughs, DO 11/28/23 1518    Felicie Horning, PA-C 11/28/23 1551    Albertus Hughs, DO 11/29/23 [id number]

## 2023-11-28 NOTE — ED Triage Notes (Signed)
 Pt c/o RLQ abd pain for 5x days. Seen by PCP, had labs and CT scan performed.  Hx stroke w/R sided deficits and expressive aphasia

## 2024-01-09 ENCOUNTER — Ambulatory Visit (HOSPITAL_BASED_OUTPATIENT_CLINIC_OR_DEPARTMENT_OTHER)
Admission: RE | Admit: 2024-01-09 | Discharge: 2024-01-09 | Disposition: A | Discharge status: Home / Self Care | Payer: Managed Care, Other (non HMO) | Source: Ambulatory Visit | Attending: Oncology | Admitting: Oncology

## 2024-01-09 ENCOUNTER — Inpatient Hospital Stay: Payer: Managed Care, Other (non HMO) | Admitting: Oncology

## 2024-01-09 ENCOUNTER — Other Ambulatory Visit: Payer: Self-pay | Admitting: *Deleted

## 2024-01-09 ENCOUNTER — Other Ambulatory Visit (HOSPITAL_COMMUNITY): Payer: Self-pay | Admitting: Family Medicine

## 2024-01-09 ENCOUNTER — Inpatient Hospital Stay: Payer: Managed Care, Other (non HMO) | Attending: Oncology

## 2024-01-09 ENCOUNTER — Other Ambulatory Visit: Payer: Self-pay | Admitting: Oncology

## 2024-01-09 VITALS — BP 167/103 | HR 82 | Temp 97.7°F | Resp 18 | Ht 67.0 in

## 2024-01-09 DIAGNOSIS — C181 Malignant neoplasm of appendix: Secondary | ICD-10-CM

## 2024-01-09 DIAGNOSIS — Z85038 Personal history of other malignant neoplasm of large intestine: Secondary | ICD-10-CM | POA: Diagnosis not present

## 2024-01-09 DIAGNOSIS — Z90722 Acquired absence of ovaries, bilateral: Secondary | ICD-10-CM | POA: Diagnosis not present

## 2024-01-09 DIAGNOSIS — Z90710 Acquired absence of both cervix and uterus: Secondary | ICD-10-CM | POA: Diagnosis not present

## 2024-01-09 DIAGNOSIS — E875 Hyperkalemia: Secondary | ICD-10-CM | POA: Insufficient documentation

## 2024-01-09 DIAGNOSIS — R1031 Right lower quadrant pain: Secondary | ICD-10-CM | POA: Insufficient documentation

## 2024-01-09 DIAGNOSIS — Z9079 Acquired absence of other genital organ(s): Secondary | ICD-10-CM | POA: Diagnosis not present

## 2024-01-09 LAB — BASIC METABOLIC PANEL - CANCER CENTER ONLY
Anion gap: 15 (ref 5–15)
BUN: 19 mg/dL (ref 6–20)
CO2: 23 mmol/L (ref 22–32)
Calcium: 11 mg/dL — ABNORMAL HIGH (ref 8.9–10.3)
Chloride: 102 mmol/L (ref 98–111)
Creatinine: 1 mg/dL (ref 0.44–1.00)
GFR, Estimated: >60 mL/min (ref >60)
Glucose, Bld: 123 mg/dL — ABNORMAL HIGH (ref 70–99)
Potassium: 4 mmol/L (ref 3.5–5.1)
Sodium: 140 mmol/L (ref 135–145)

## 2024-01-09 LAB — CEA (ACCESS): CEA (CHCC): 2.7 ng/mL (ref 0.00–5.00)

## 2024-01-09 MED ORDER — IOHEXOL 300 MG/ML  SOLN
100.0000 mL | Freq: Once | INTRAMUSCULAR | Status: AC | PRN
Start: 2024-01-09 — End: 2024-01-09
  Administered 2024-01-09: 80 mL via INTRAVENOUS

## 2024-01-09 NOTE — Progress Notes (Signed)
 [facility] OFFICE PROGRESS NOTE   Diagnosis: Appendix carcinoma  INTERVAL HISTORY:   [name] returns for a scheduled visit.  She is here with her husband and a caretaker.  She reports right lower abdominal pain for the past 3 months.  She has been seen in the emergency room and reports several visits at [facility] medicine.  She was seen in the emergency room 11/28/2023 and treated for urinary tract infection.  She has come assistant pain.  No nausea or dysuria.  No diarrhea or fever. A CT abdomen/pelvis on 11/27/2023 revealed minimal stranding at the mesentery proximal anterior to the proximal transverse colon.  There was stable bilateral nephrolithiasis without obstruction.  An abdominal ultrasound 11/28/2023 revealed no gallstones.  The pain is relieved with naproxen .  She takes naproxen  once daily.   Objective:  Vital signs in last 24 hours:  Blood pressure (!) 167/103, pulse 82, temperature 97.7 F (36.5 C), temperature source Temporal, resp. rate 18, height 5' 7 (1.702 m), SpO2 100%.   Limited examination as patient declined to get on the examination table. Resp: Lungs clear bilaterally Cardio: Regular rate and rhythm GI: No hepatosplenomegaly, soft, no mass, no apparent hernia,, mild tenderness at the right lower abdomen/right groin.  The area of disc comfort appears to localize to the right suprapubic region extending to the upper aspect of the right labia.  No palpable mass. Vascular: No leg edema   Lab Results:  Lab Results  Component Value Date   WBC 6.6 11/28/2023   HGB 15.3 (H) 11/28/2023   HCT 47.9 (H) 11/28/2023   MCV 90.7 11/28/2023   PLT 192 11/28/2023   NEUTROABS 7.3 03/26/2023    CMP  Lab Results  Component Value Date   NA 140 01/09/2024   K 4.0 01/09/2024   CL 102 01/09/2024   CO2 23 01/09/2024   GLUCOSE 123 (H) 01/09/2024   BUN 19 01/09/2024   CREATININE 1.00 01/09/2024   CALCIUM  11.0 (H) 01/09/2024   PROT 7.3 11/28/2023    ALBUMIN 4.1 11/28/2023   AST 21 11/28/2023   ALT 21 11/28/2023   ALKPHOS 98 11/28/2023   BILITOT 0.9 11/28/2023   GFRNONAA >60 01/09/2024   GFRAA >60 12/19/2019    Lab Results  Component Value Date   CEA1 2.90 11/06/2021   CEA 2.70 01/09/2024    Lab Results  Component Value Date   INR 1.2 03/26/2023   LABPROT 15.7 (H) 03/26/2023    Imaging:  CT CHEST W CONTRAST Result Date: 01/09/2024 CLINICAL DATA:  Surveillance history of metastatic appendix cancer EXAM: CT CHEST WITH CONTRAST TECHNIQUE: Multidetector CT imaging of the chest was performed during intravenous contrast administration. RADIATION DOSE REDUCTION: This exam was performed according to the departmental dose-optimization program which includes automated exposure control, adjustment of the mA and/or kV according to patient size and/or use of iterative reconstruction technique. CONTRAST:  80mL OMNIPAQUE  IOHEXOL  300 MG/ML  SOLN COMPARISON:  July 03, 2023 CT chest FINDINGS: Cardiovascular: No significant vascular findings. Normal heart size. No pericardial effusion. Mediastinum/Nodes: No enlarged mediastinal, hilar, or axillary lymph nodes. Thyroid  gland, trachea, and esophagus demonstrate no significant findings. Comparison with prior examination accounting for differences in techniques no significant change in the enlarged left hemi thyroid  with a dominant mass that measures 3.8 x 4.5 cm, displacing the trachea to the right consistent with multinodular goiter. Lungs/Pleura: Lungs are clear. No pleural effusion or pneumothorax. Upper Abdomen: Gallstones without gallbladder inflammatory changes. Correlation with gallbladder ultrasound recommended as  clinically needed Comparison with prior examination demonstrates no change in the left adrenal gland mass measuring 4.2 x 3.5 cm Musculoskeletal: No chest wall abnormality. No acute or significant osseous findings. IMPRESSION: *No evidence of metastatic disease to the chest. *No change in  the left adrenal gland mass measuring 4.2 x 3.5 cm. (Stable left adrenal gland adenoma) *Gallstones without gallbladder inflammatory changes. Correlation with gallbladder ultrasound recommended as clinically needed. *No change in the enlarged left hemi thyroid  with a dominant mass that measures 3.8 x 4.5 cm, displacing the trachea to the right consistent with multinodular goiter. Electronically Signed   By: Fredrich Jefferson M.D.   On: 01/09/2024 11:13    Medications: I have reviewed the patient's current medications.   Assessment/Plan: Goblet cell adenocarcinoma of the appendix, stage IV, (pT4a,pNx,pM1c) 12/06/2021 TAH/BSO, omentum resection, cul-de-sac biopsy-metastatic poorly differentiated carcinoma with signet ring cells involving bilateral ovaries, the omentum, anterior cul-de-sac biopsy suspicious for malignancy.  Resection margins negative. Foundation 1-microsatellite status cannot be determined, tumor mutation burden cannot be determined, [id number], KMT2D(MLL2) MRI abdomen and pelvis 10/26/2021-new large bilateral adnexal masses concerning for bilateral ovarian neoplasm, stable 4 cm left adrenal gland lesion consistent with a benign adenoma, no evidence of omental disease, no adenopathy PET 01/17/2022-no evidence of residual or recurrent appendiceal carcinoma, small cutaneous nodule at the left posterior thorax with mild hypermetabolism, left thyroid  goiter, 4 cm benign left adrenal adenoma CTs 08/06/2022-no evidence of progressive disease, fluid adjacent to the gallbladder fundus CTs 07/03/2023: No evidence of local recurrence or metastatic disease, stable small rim of fluid at the gallbladder fundus CT abdomen/pelvis 11/27/2023-stable bilateral nephrolithiasis without obstruction, minimal stranding at the mesentery anterior to the proximal transverse colon (minimal nodular stranding was noted anterior to the ascending colon on 07/03/2023) CT chest 01/09/2024-no evidence of metastatic disease, stable left  adrenal gland mass, no change in left thyroid  multinodular goiter 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   Nephrolithiasis 4.   Admission 12/09/2020 with urosepsis secondary to obstructing right renal stone, status post percutaneous nephrostomy placement Cystoscopy with laser lithotripsy and right JJ stent placement 02/08/2021    Disposition: [name] has a history of metastatic goblet cell adenocarcinoma of the appendix.  There is been no evidence of disease progression since she underwent a TAH/BSO and omentum resection in May 2023.  She has a 69-month history of right lower abdomen/groin pain.  A CT 11/27/2023 revealed no evidence of progressive cancer.  Mild stranding in the right upper abdomen mesentery is likely nonspecific, stranding was noted on a CT in December 2024.  She is at high risk of developing progressive appendix carcinoma.  The pain may be related to progressive carcinomatosis, adhesions, or another etiology.  She does not appear to have significant dysuria.  I have a low suspicion for a GI process such as diverticulitis.  I will refer her to Dr. Orvil Bland for an examination and to consider the indication for additional imaging (pelvic MRI versus PET).  She will return for an office visit in 3 weeks.  She will continue naproxen  as needed for pain.  She declined a prescription for tramadol .  Her husband will contact us  in the interim if she has increased pain or new symptoms.  The calcium  was mildly elevated on labs today.  This may be related to taking calcium .  We will repeat a chemistry panel when she returns in 3 weeks.  Coni Deep, MD  01/09/2024  11:50 AM

## 2024-01-09 NOTE — Progress Notes (Signed)
 Lab orders placed.

## 2024-01-13 ENCOUNTER — Other Ambulatory Visit: Payer: Self-pay | Admitting: *Deleted

## 2024-01-13 DIAGNOSIS — C181 Malignant neoplasm of appendix: Secondary | ICD-10-CM

## 2024-01-13 NOTE — Progress Notes (Signed)
 MRI pelvis with and without contrast ordered entered and spoke with husband Mr Ocie Belt and informed him of MRI and then appt with Dr Orvil Bland will be scheduled

## 2024-01-15 ENCOUNTER — Telehealth: Payer: Self-pay | Admitting: Oncology

## 2024-01-15 ENCOUNTER — Encounter: Payer: Self-pay | Admitting: *Deleted

## 2024-01-15 ENCOUNTER — Telehealth: Payer: Self-pay | Admitting: *Deleted

## 2024-01-15 NOTE — Telephone Encounter (Signed)
 Called Mr. Ocie Belt (spouse) and advised him of the MRI scheduled for 01/17/2024 at 10:30 with arrival to [facility] at 10:00 to check in.  Also advised him of appointment with Dr. Orvil Bland on 01/23/24 at 4:00.  He was able to go over everything with Adelisa while on the phone and she agreed to the appointments.

## 2024-01-15 NOTE — Telephone Encounter (Signed)
 Called Mr Melanie Madden regarding MRi scheduled for this Friday at [facility] with arrival time of 10 am, unable to leave message due to voicemail box is full

## 2024-01-16 ENCOUNTER — Telehealth: Payer: Self-pay | Admitting: Oncology

## 2024-01-16 NOTE — Telephone Encounter (Signed)
 Called [name] (husband) and let him know that Dr. Orvil Bland would like to see Melanie Madden first on 01/23/24 and do an exam and then decide on imaging.  Let him know the MRI has been canceled.  He verbalized understanding and agreement.

## 2024-01-17 ENCOUNTER — Ambulatory Visit (HOSPITAL_COMMUNITY): Admission: RE | Admit: 2024-01-17 | Source: Ambulatory Visit

## 2024-01-17 ENCOUNTER — Encounter (HOSPITAL_COMMUNITY): Payer: Self-pay

## 2024-01-23 ENCOUNTER — Inpatient Hospital Stay (HOSPITAL_BASED_OUTPATIENT_CLINIC_OR_DEPARTMENT_OTHER): Admitting: Gynecologic Oncology

## 2024-01-23 ENCOUNTER — Encounter: Payer: Self-pay | Admitting: Gynecologic Oncology

## 2024-01-23 VITALS — BP 148/79 | HR 83 | Temp 98.1°F | Resp 17 | Ht 67.0 in

## 2024-01-23 DIAGNOSIS — R109 Unspecified abdominal pain: Secondary | ICD-10-CM | POA: Diagnosis not present

## 2024-01-23 DIAGNOSIS — R102 Pelvic and perineal pain: Secondary | ICD-10-CM

## 2024-01-23 DIAGNOSIS — R1031 Right lower quadrant pain: Secondary | ICD-10-CM | POA: Diagnosis not present

## 2024-01-23 DIAGNOSIS — Z85038 Personal history of other malignant neoplasm of large intestine: Secondary | ICD-10-CM | POA: Diagnosis not present

## 2024-01-23 NOTE — Progress Notes (Signed)
 Gynecologic Oncology Return Clinic Visit  01/23/24  Reason for Visit: abdominal pain  Treatment History: 12/06/21: Robotic TLH/BSO, mini-lap for specimen delivery and upper abdominal palpation, infracolic omentectomy, peritoneal biopsy, appendectomy. 12/22/21: Established care with Dr. Cloretta for newly diagnosed stage IV appendiceal adenocarcinoma.   Interval History: I was asked to see the patient in the setting of a 2-3 day history of right lower abdominal pain/groin pain.  The patient is unable to verbalize this pain by caretaker with her today tells me that she is in pain every day.  The patient grabs at her lower abdomen frequently during the visit.  Bowel function reported to be normal.  Past Medical/Surgical History: Past Medical History:  Diagnosis Date   Allergy    Anxiety    Cancer of appendix (HCC) Stage 4    Depression    Expressive aphasia    Hemiparesis (HCC)    Right side   High triglycerides    History of kidney stones    Hypertension    Kidney stones    Recurrent sinus infections    Septic shock (HCC) 03/26/2023   Septic shock (HCC) 02/08/2021   Stroke (HCC) 07/09/2019    Past Surgical History:  Procedure Laterality Date   ANTERIOR CRUCIATE LIGAMENT REPAIR Left    BUNIONECTOMY Right    CYSTOSCOPY/URETEROSCOPY/HOLMIUM LASER/STENT PLACEMENT Right 02/08/2021   Procedure: CYSTOSCOPY/RETROGRADE/URETEROSCOPY/HOLMIUM LASER/STENT PLACEMENT, NEPHROSTOMY TUBE REMOVAL;  Surgeon: Devere Lonni Righter, MD;  Location: [facility];  Service: Urology;  Laterality: Right;   CYSTOSCOPY/URETEROSCOPY/HOLMIUM LASER/STENT PLACEMENT Bilateral 04/10/2023   Procedure: CYSTOSCOPY, BILATERAL URETEROSCOPY, BILATERAL RETROGRADE PYELOGRAM, HOLMIUM LASER LITHOTRIPSY, AND BILATERAL URETERAL STENT PLACEMENT, removal of percutanous stent post op;  Surgeon: Devere Lonni Righter, MD;  Location: [facility];  Service: Urology;  Laterality: Bilateral;  90 MINUTES   IR  NEPHROSTOMY PLACEMENT RIGHT  12/09/2020   IR NEPHROSTOMY PLACEMENT RIGHT  03/27/2023   LASIK  2008   LIPOSUCTION     LITHOTRIPSY     MENISCUS REPAIR Left    NASAL SEPTUM SURGERY     ROBOTIC ASSISTED TOTAL HYSTERECTOMY WITH BILATERAL SALPINGO OOPHERECTOMY Bilateral 12/06/2021   Procedure: XI ROBOTIC ASSISTED BILATERAL SALPINGO OOPHORECTOMY, TOTAL HYSTERECTOMY, PERITONEAL BIOPSY, OMENTECTOMY, LAPAROTOMY, APPENDECTOMY, PAP SMEAR;  Surgeon: Viktoria Comer SAUNDERS, MD;  Location: [facility];  Service: Gynecology;  Laterality: Bilateral;   SHOULDER SURGERY Right    VARICOSE VEIN SURGERY Left     Family History  Problem Relation Age of Onset   Alzheimer's disease Mother    Hypertension Mother    Diabetes Mother    Thyroid  disease Mother    Deep vein thrombosis Maternal Grandmother    Colon cancer Neg Hx    Colon polyps Neg Hx    Esophageal cancer Neg Hx    Pancreatic cancer Neg Hx    Rectal cancer Neg Hx    Stomach cancer Neg Hx    Breast cancer Neg Hx     Social History   Socioeconomic History   Marital status: Married    Spouse name: Not on file   Number of children: Not on file   Years of education: Not on file   Highest education level: Not on file  Occupational History   Not on file  Tobacco Use   Smoking status: Former   Smokeless tobacco: Former  Advertising account planner   Vaping status: Never Used  Substance and Sexual Activity   Alcohol use: Yes    Comment: occ wine   Drug use: No  Sexual activity: Yes    Partners: Male  Other Topics Concern   Not on file  Social History Narrative   Not on file   Social Drivers of Health   Financial Resource Strain: Not on file  Food Insecurity: Not on file  Transportation Needs: Not on file  Physical Activity: Not on file  Stress: Not on file  Social Connections: Not on file    Current Medications:  Current Outpatient Medications:    amLODipine  (NORVASC ) 10 MG tablet, Take 1 tablet (10 mg total) by mouth daily. Hold until follow-up  with your doctor, Disp: , Rfl:    atorvastatin  (LIPITOR) 20 MG tablet, Take 1 tablet (20 mg total) by mouth daily at 6 PM., Disp: 30 tablet, Rfl: 1   B Complex-C (B-COMPLEX WITH VITAMIN C) tablet, Take 1 tablet by mouth daily., Disp: , Rfl:    Bisacodyl  ([employer] C-LAX LAXATIVE PO), Take by mouth as needed (constiipation)., Disp: , Rfl:    ferrous sulfate 325 (65 FE) MG EC tablet, Take 325 mg by mouth daily with breakfast., Disp: , Rfl:    losartan (COZAAR) 25 MG tablet, Take 25 mg by mouth daily., Disp: , Rfl:    zinc gluconate 50 MG tablet, Take 50 mg by mouth daily., Disp: , Rfl:    CALCIUM  PO, Take 1 tablet by mouth daily., Disp: , Rfl:    cephALEXin  (KEFLEX ) 500 MG capsule, Take 1 capsule (500 mg total) by mouth 2 (two) times daily., Disp: 10 capsule, Rfl: 0   Cholecalciferol (VITAMIN D) 50 MCG (2000 UT) CAPS, Take 2,000 Units by mouth daily., Disp: , Rfl:    DULoxetine  (CYMBALTA ) 20 MG capsule, Take 1 capsule (20 mg total) by mouth in the morning. (Patient not taking: Reported on 01/09/2024), Disp: 30 capsule, Rfl: 2   gabapentin  (NEURONTIN ) 100 MG capsule, 100 mg at bedtime., Disp: , Rfl:    magnesium  30 MG tablet, Take 30 mg by mouth daily., Disp: , Rfl:    Omega-3 Fatty Acids (FISH OIL PO), Take by mouth., Disp: , Rfl:    oxybutynin  (DITROPAN ) 5 MG tablet, Take 1 tablet (5 mg total) by mouth every 8 (eight) hours as needed for bladder spasms., Disp: 30 tablet, Rfl: 1   traMADol  (ULTRAM ) 50 MG tablet, Take 1 tablet (50 mg total) by mouth every 12 (twelve) hours as needed for moderate pain or severe pain., Disp: 30 tablet, Rfl: 0   VITAMIN E PO, Take 1 tablet by mouth daily., Disp: , Rfl:   Review of Systems: + joint pain, shoulder pain, abdominal/pelvic pain Denies appetite changes, fevers, chills, fatigue, unexplained weight changes. Denies hearing loss, neck lumps or masses, mouth sores, ringing in ears or voice changes. Denies cough or wheezing.  Denies shortness of breath. Denies chest  pain or palpitations. Denies leg swelling. Denies abdominal distention, blood in stools, constipation, diarrhea, nausea, vomiting, or early satiety. Denies pain with intercourse, dysuria, frequency, hematuria or incontinence. Denies hot flashes, vaginal bleeding or vaginal discharge.   Denies back pain or muscle pain/cramps. Denies itching, rash, or wounds. Denies dizziness, headaches, numbness or seizures. Denies swollen lymph nodes or glands, denies easy bruising or bleeding. Denies anxiety, depression, confusion, or decreased concentration.  Physical Exam: BP (!) 148/79 (BP Location: Left Arm, Patient Position: Sitting)   Pulse 83   Temp 98.1 F (36.7 C) (Oral)   Resp 17   Ht 5' 7 (1.702 m)   SpO2 98%   BMI 23.02 kg/m  General: Alert, oriented,  no acute distress. HEENT: Posterior oropharynx clear, sclera anicteric. Chest: Clear to auscultation bilaterally.  No wheezes or rhonchi. Cardiovascular: Regular rate and rhythm, no murmurs. Abdomen: soft, nontender.  Normoactive bowel sounds.  No masses or hepatosplenomegaly appreciated.  Well-healed incisions.  Mild tenderness with deep palpation in the right lower quadrant and suprapubic area as well as along the right groin.  No abnormalities palpated. Extremities: Grossly normal range of motion of left upper and lower extremity. Warm, well perfused.  No edema bilaterally. Skin: No rashes or lesions noted. Lymphatics: No cervical, supraclavicular, or inguinal adenopathy. GU: Normal appearing external genitalia without erythema, excoriation, or lesions.  Speculum exam reveals no visible vaginal lesions.  On bimanual exam, the patient endorses tenderness with palpation along the right pelvic floor musculature.  No masses or nodularity appreciated.  Laboratory & Radiologic Studies:       Component Ref Range & Units (hover) 2 wk ago (01/09/24) 6 mo ago (07/11/23) 1 yr ago (01/01/23) 1 yr ago (08/06/22)  CEA ([facility]) 2.70 2.59 CM 2.81 CM 2.64  C      Assessment & Plan: Melanie Madden is a 58 y.o. 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  Now with several month history of lower abdominal/groin pain.  Reviewed with the patient and her caretaker that there are no focal findings on my exam other than some tenderness along the right side of the pelvic floor.  History is somewhat limited given patient's ability to answer questions about the pain.  I spoke with one of her radiologist regarding her symptoms.  In the setting of prior surgery, recommendation is for repeat CT rather than MRI.  CT abdomen and pelvis ordered.  If this is negative for obvious etiology of her pain or recurrent disease, I think she may benefit from physical therapy including pelvic floor physical therapy.  Her caretaker also notes that she sometimes indicates pain higher along her right side or even her breast area.  It may also be worthwhile pursuing whether this could be related to paralysis and sequelae from her 2020 stroke.  38 minutes of total time was spent for this patient encounter, including preparation, face-to-face counseling with the patient and coordination of care, and documentation of the encounter.  Comer Dollar, MD  Division of Gynecologic Oncology  Department of Obstetrics and Gynecology  [facility]

## 2024-01-29 ENCOUNTER — Telehealth: Payer: Self-pay | Admitting: Oncology

## 2024-01-29 NOTE — Telephone Encounter (Signed)
 PT needs to cancel appointment, reason unspecified. Will call to reschedule.

## 2024-01-30 ENCOUNTER — Inpatient Hospital Stay: Admitting: Oncology

## 2024-02-06 ENCOUNTER — Ambulatory Visit (HOSPITAL_COMMUNITY)
Admission: RE | Admit: 2024-02-06 | Discharge: 2024-02-06 | Disposition: A | Discharge status: Home / Self Care | Source: Ambulatory Visit | Attending: Gynecologic Oncology | Admitting: Gynecologic Oncology

## 2024-02-06 DIAGNOSIS — R102 Pelvic and perineal pain: Secondary | ICD-10-CM | POA: Diagnosis present

## 2024-02-06 MED ORDER — SODIUM CHLORIDE (PF) 0.9 % IJ SOLN
INTRAMUSCULAR | Status: AC
  Filled 2024-02-06: qty 50

## 2024-02-06 MED ORDER — IOHEXOL 300 MG/ML  SOLN
100.0000 mL | Freq: Once | INTRAMUSCULAR | Status: AC | PRN
  Administered 2024-02-06: 100 mL via INTRAVENOUS

## 2024-02-06 MED ORDER — IOHEXOL 9 MG/ML PO SOLN
500.0000 mL | ORAL | Status: AC
  Administered 2024-02-06 (×2): 500 mL via ORAL

## 2024-02-10 ENCOUNTER — Telehealth: Payer: Self-pay

## 2024-02-10 NOTE — Telephone Encounter (Signed)
 RN from [facility] called the office regarding the recent CT A/P scan done on 7/10. She wanted to make sure Dr.Tucker saw the recommendation of a follow up PET scan.   Message sent to Dr.Tucker.

## 2024-02-11 ENCOUNTER — Other Ambulatory Visit: Payer: Self-pay | Admitting: Gynecologic Oncology

## 2024-02-11 ENCOUNTER — Ambulatory Visit: Payer: Self-pay | Admitting: Gynecologic Oncology

## 2024-02-11 DIAGNOSIS — K668 Other specified disorders of peritoneum: Secondary | ICD-10-CM

## 2024-02-11 DIAGNOSIS — R935 Abnormal findings on diagnostic imaging of other abdominal regions, including retroperitoneum: Secondary | ICD-10-CM

## 2024-02-11 DIAGNOSIS — C181 Malignant neoplasm of appendix: Secondary | ICD-10-CM

## 2024-02-11 NOTE — Telephone Encounter (Signed)
 Spoke with patient's spouse and relayed message from Dr. Viktoria that she sent a MyChart message with CT scan results. Results reviewed and They don't know if the small areas represent cancer recurrence or something unrelated. Pt also has multiple kidney stones, which Dr. Viktoria suspects is more likely the cause of the pain she's been having. She would like to get a pet scan. Pt's spouse verbalized understanding and agreed to having the pet scan ordered and scheduled.  Pet scan scheduled for July 23rd. At 4:30 pm with arrival time of 4:00 at [facility]. Pt is to have nothing solid to eat after 10:30 am. And only water  until 4:30 pm.  And no sugary foods the morning prior to scan. Relayed this to patient's spouse Melanie Madden, Melanie verbalized understanding and thanked the office for calling.

## 2024-02-11 NOTE — Telephone Encounter (Signed)
-----   Message from Eleanor JONETTA Epps sent at 02/11/2024 11:26 AM EDT ----- PET has been ordered. Please call pt and set this up. ----- Message ----- From: Viktoria Comer SAUNDERS, MD Sent: 02/11/2024   6:44 AM EDT To: Eleanor JONETTA Epps, NP  I sent her a message with the CT scan results. Could you please have the office call her again to review the results, that we don't know if the small areas represent cancer recurrence or something  unrelated. She also has multiple kidney stones, which I suspect is more likely the cause of the pain she's been having.I would like to get a pet scan. Could you order this? Thank you! ----- Message ----- From: Interface, Rad Results In Sent: 02/09/2024  10:38 AM EDT To: Comer SAUNDERS Viktoria, MD

## 2024-02-19 ENCOUNTER — Ambulatory Visit (HOSPITAL_COMMUNITY)
Admission: RE | Admit: 2024-02-19 | Discharge: 2024-02-19 | Disposition: A | Discharge status: Home / Self Care | Source: Ambulatory Visit | Attending: Gynecologic Oncology | Admitting: Gynecologic Oncology

## 2024-02-19 DIAGNOSIS — R935 Abnormal findings on diagnostic imaging of other abdominal regions, including retroperitoneum: Secondary | ICD-10-CM | POA: Insufficient documentation

## 2024-02-19 DIAGNOSIS — C181 Malignant neoplasm of appendix: Secondary | ICD-10-CM | POA: Diagnosis present

## 2024-02-19 DIAGNOSIS — K668 Other specified disorders of peritoneum: Secondary | ICD-10-CM | POA: Insufficient documentation

## 2024-02-19 LAB — GLUCOSE, CAPILLARY: Glucose-Capillary: 112 mg/dL — ABNORMAL HIGH (ref 70–99)

## 2024-02-19 MED ORDER — FLUDEOXYGLUCOSE F - 18 (FDG) INJECTION
7.3000 | Freq: Once | INTRAVENOUS | Status: AC
  Administered 2024-02-19: 7.3 via INTRAVENOUS

## 2024-02-21 ENCOUNTER — Telehealth: Payer: Self-pay | Admitting: Gynecologic Oncology

## 2024-02-21 NOTE — Telephone Encounter (Signed)
 Spoke with the patient's husband - reviewed PET (inconclusive) and recommendations for repeat imaging in 3 months.  I do not know that the small areas that were seen on the CT scan would explain her pain.  I would recommend repeat imaging in 3 months to assess for any change.  I will reach out to Dr. Cloretta with these recommendations.  I had spoken with the patient about physical therapy or pelvic floor physical therapy while she was here.  I will also discussed this with Dr. Cloretta as a possible recommendation that he could make when he sees her next.  Comer Dollar MD Gynecologic Oncology

## 2024-02-24 ENCOUNTER — Ambulatory Visit: Payer: Self-pay | Admitting: *Deleted

## 2024-02-24 ENCOUNTER — Telehealth: Payer: Self-pay | Admitting: Oncology

## 2024-02-24 NOTE — Telephone Encounter (Signed)
 CALLED PT HUSBAND TO [facility] APPT. MAILBOX FULL

## 2024-02-24 NOTE — Telephone Encounter (Signed)
 Sent scheduling message to schedule 30 minute OV with Dr. Cloretta in next few weeks and call husband. Per Dr. Cloretta.

## 2024-03-12 ENCOUNTER — Telehealth: Payer: Self-pay | Admitting: Oncology

## 2024-03-12 NOTE — Telephone Encounter (Signed)
 Unable to reach via phone, voicemail was left.  Scheduling Message Entered by STEVA DEVERE CROME on 02/24/2024 at 10:13 AM Priority: Routine <No visit type provided>  Department: [facility]  Provider:  Scheduling Notes:  Needs 30 minute appointment with Dr. Cloretta in next few weeks--call husband, NOT patient. Thanks

## 2024-03-26 ENCOUNTER — Inpatient Hospital Stay: Attending: Oncology | Admitting: Oncology

## 2024-03-26 VITALS — BP 138/90 | HR 92 | Temp 98.1°F | Resp 18 | Ht 67.0 in

## 2024-03-26 DIAGNOSIS — C181 Malignant neoplasm of appendix: Secondary | ICD-10-CM | POA: Insufficient documentation

## 2024-03-26 DIAGNOSIS — G8191 Hemiplegia, unspecified affecting right dominant side: Secondary | ICD-10-CM | POA: Insufficient documentation

## 2024-03-26 DIAGNOSIS — Z8673 Personal history of transient ischemic attack (TIA), and cerebral infarction without residual deficits: Secondary | ICD-10-CM | POA: Diagnosis not present

## 2024-03-26 NOTE — Progress Notes (Signed)
 [facility] OFFICE PROGRESS NOTE   Diagnosis: Appendix, normal  INTERVAL HISTORY:   Mr. Melanie Madden returns as scheduled.  She is here with a caretaker.  She reports improvement in abdominal pain.  No new complaint. She saw Dr. Viktoria 01/22/2024.  She was noted to have tenderness at the right pelvic floor and in the right lower abdomen, right groin, and suprapubic area.  She was referred for CT abdomen/pelvis 02/06/2024.  2 small nodules were noted in the omentum.  The differential diagnoses included fat necrosis or developing metastatic disease.  A PET scan 02/19/2024 found to subcentimeter nodules in the right lateral omentum without hypermetabolism.    Objective:  Vital signs in last 24 hours:  Blood pressure (!) 138/90, pulse 92, temperature 98.1 F (36.7 C), temperature source Temporal, resp. rate 18, height 5' 7 (1.702 m), SpO2 99%.   Physical examination declined by patient  Lab Results:  Lab Results  Component Value Date   WBC 6.6 11/28/2023   HGB 15.3 (H) 11/28/2023   HCT 47.9 (H) 11/28/2023   MCV 90.7 11/28/2023   PLT 192 11/28/2023   NEUTROABS 7.3 03/26/2023    CMP  Lab Results  Component Value Date   NA 140 01/09/2024   K 4.0 01/09/2024   CL 102 01/09/2024   CO2 23 01/09/2024   GLUCOSE 123 (H) 01/09/2024   BUN 19 01/09/2024   CREATININE 1.00 01/09/2024   CALCIUM  11.0 (H) 01/09/2024   PROT 7.3 11/28/2023   ALBUMIN 4.1 11/28/2023   AST 21 11/28/2023   ALT 21 11/28/2023   ALKPHOS 98 11/28/2023   BILITOT 0.9 11/28/2023   GFRNONAA >60 01/09/2024   GFRAA >60 12/19/2019    Lab Results  Component Value Date   CEA1 2.90 11/06/2021   CEA 2.70 01/09/2024    Medications: I have reviewed the patient's current medications.   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  Resection margins negative. Foundation 1-microsatellite status cannot be determined, tumor mutation burden cannot be determined, [id number], KMT2D(MLL2) MRI abdomen and pelvis 10/26/2021-new large bilateral adnexal masses concerning for bilateral ovarian neoplasm, stable 4 cm left adrenal gland lesion consistent with a benign adenoma, no evidence of omental disease, no adenopathy PET 01/17/2022-no evidence of residual or recurrent appendiceal carcinoma, small cutaneous nodule at the left posterior thorax with mild hypermetabolism, left thyroid  goiter, 4 cm benign left adrenal adenoma CTs 08/06/2022-no evidence of progressive disease, fluid adjacent to the gallbladder fundus CTs 07/03/2023: No evidence of local recurrence or metastatic disease, stable small rim of fluid at the gallbladder fundus CT abdomen/pelvis 11/27/2023-stable bilateral nephrolithiasis without obstruction, minimal stranding at the mesentery anterior to the proximal transverse colon (minimal nodular stranding was noted anterior to the ascending colon on 07/03/2023) CT chest 01/09/2024-no evidence of metastatic disease, stable left adrenal gland mass, no change in left thyroid  multinodular goiter CT abdomen/pelvis 02/06/2024: 7 mm and 6.5 mm nodules in the right omentum Multi bilateral renal calculi PET 02/19/2024: 2 omental nodules without abnormal hypermetabolism, left adrenal adenoma, numerous bilateral renal stones 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   Nephrolithiasis 4.   Admission 12/09/2020 with urosepsis secondary to obstructing right renal stone, status post percutaneous nephrostomy placement Cystoscopy with laser lithotripsy and right JJ stent placement 02/08/2021  Disposition: [name] has a history of metastatic appendix carcinoma.  She had right  lower abdominal pain when I saw her in June.  A CT and PET evaluation reveals 2 small right omental nodules and no other evidence of progressive metastatic disease.  She reports the pain has improved and she denies pain today.  I reviewed the CT and pet images.  I recommend a follow-up CT abdomen/pelvis in November.  She declines imaging.  She agrees to a follow-up visit in December.  She will call in the interim for new symptoms.  Arley Hof, MD  03/26/2024  2:11 PM

## 2024-03-26 NOTE — Progress Notes (Signed)
 Accompanied for visit today with caregiver, Olam. Called husband to reconcile her medication list. He adds that she was scheduled for colonoscopy, but she decided not to do it.

## 2024-06-02 ENCOUNTER — Telehealth: Payer: Self-pay | Admitting: *Deleted

## 2024-06-02 NOTE — Telephone Encounter (Signed)
 Dr. Cloretta agrees to see her 11/5 at 2pm and patient/caregiver accepted this appointment.

## 2024-06-02 NOTE — Telephone Encounter (Signed)
 Caregiver, Melanie Madden called and requested a sooner appointment with Dr. Cloretta than her December appointment. Melanie Madden has been verbalizing progression in pain at right abdomen/hip area over past several days. Takes #2 Aleve /day. Bowels are moving well. She and family have stressed to her that she needs to allow MD to examine her to determine reason for her pain.

## 2024-06-03 ENCOUNTER — Encounter (HOSPITAL_COMMUNITY): Payer: Self-pay

## 2024-06-03 ENCOUNTER — Inpatient Hospital Stay: Attending: Oncology | Admitting: Oncology

## 2024-06-03 ENCOUNTER — Observation Stay (HOSPITAL_COMMUNITY)

## 2024-06-03 ENCOUNTER — Observation Stay (HOSPITAL_COMMUNITY)
Admission: AD | Admit: 2024-06-03 | Discharge: 2024-06-04 | Disposition: A | Discharge status: Home / Self Care | Source: Ambulatory Visit | Attending: Internal Medicine | Admitting: Internal Medicine

## 2024-06-03 ENCOUNTER — Encounter (HOSPITAL_COMMUNITY): Payer: Self-pay | Admitting: Internal Medicine

## 2024-06-03 ENCOUNTER — Inpatient Hospital Stay

## 2024-06-03 ENCOUNTER — Encounter: Payer: Self-pay | Admitting: Oncology

## 2024-06-03 VITALS — BP 140/82 | HR 94 | Temp 98.2°F | Resp 18 | Ht 67.0 in

## 2024-06-03 DIAGNOSIS — R509 Fever, unspecified: Secondary | ICD-10-CM | POA: Diagnosis not present

## 2024-06-03 DIAGNOSIS — Z8509 Personal history of malignant neoplasm of other digestive organs: Secondary | ICD-10-CM | POA: Diagnosis not present

## 2024-06-03 DIAGNOSIS — R4701 Aphasia: Secondary | ICD-10-CM | POA: Diagnosis not present

## 2024-06-03 DIAGNOSIS — R1031 Right lower quadrant pain: Principal | ICD-10-CM | POA: Diagnosis present

## 2024-06-03 DIAGNOSIS — I610 Nontraumatic intracerebral hemorrhage in hemisphere, subcortical: Secondary | ICD-10-CM | POA: Diagnosis not present

## 2024-06-03 DIAGNOSIS — Z87891 Personal history of nicotine dependence: Secondary | ICD-10-CM | POA: Insufficient documentation

## 2024-06-03 DIAGNOSIS — Z79899 Other long term (current) drug therapy: Secondary | ICD-10-CM | POA: Diagnosis not present

## 2024-06-03 DIAGNOSIS — C181 Malignant neoplasm of appendix: Secondary | ICD-10-CM

## 2024-06-03 DIAGNOSIS — Z85038 Personal history of other malignant neoplasm of large intestine: Secondary | ICD-10-CM | POA: Insufficient documentation

## 2024-06-03 DIAGNOSIS — I10 Essential (primary) hypertension: Secondary | ICD-10-CM | POA: Insufficient documentation

## 2024-06-03 DIAGNOSIS — N200 Calculus of kidney: Secondary | ICD-10-CM

## 2024-06-03 DIAGNOSIS — N132 Hydronephrosis with renal and ureteral calculous obstruction: Principal | ICD-10-CM | POA: Insufficient documentation

## 2024-06-03 DIAGNOSIS — Z8673 Personal history of transient ischemic attack (TIA), and cerebral infarction without residual deficits: Secondary | ICD-10-CM | POA: Insufficient documentation

## 2024-06-03 LAB — COMPREHENSIVE METABOLIC PANEL WITH GFR
ALT: 25 U/L (ref 0–44)
AST: 25 U/L (ref 15–41)
Albumin: 3.7 g/dL (ref 3.5–5.0)
Alkaline Phosphatase: 100 U/L (ref 38–126)
Anion gap: 12 (ref 5–15)
BUN: 22 mg/dL — ABNORMAL HIGH (ref 6–20)
CO2: 23 mmol/L (ref 22–32)
Calcium: 10.3 mg/dL (ref 8.9–10.3)
Chloride: 103 mmol/L (ref 98–111)
Creatinine, Ser: 1.06 mg/dL — ABNORMAL HIGH (ref 0.44–1.00)
GFR, Estimated: >60 mL/min (ref >60)
Glucose, Bld: 155 mg/dL — ABNORMAL HIGH (ref 70–99)
Potassium: 4.1 mmol/L (ref 3.5–5.1)
Sodium: 138 mmol/L (ref 135–145)
Total Bilirubin: 0.4 mg/dL (ref 0.0–1.2)
Total Protein: 6.7 g/dL (ref 6.5–8.1)

## 2024-06-03 LAB — CBC
HCT: 44.6 % (ref 36.0–46.0)
Hemoglobin: 14.3 g/dL (ref 12.0–15.0)
MCH: 29.3 pg (ref 26.0–34.0)
MCHC: 32.1 g/dL (ref 30.0–36.0)
MCV: 91.4 fL (ref 80.0–100.0)
Platelets: 170 K/uL (ref 150–400)
RBC: 4.88 MIL/uL (ref 3.87–5.11)
RDW: 13.6 % (ref 11.5–15.5)
WBC: 5.9 K/uL (ref 4.0–10.5)
nRBC: 0 % (ref 0.0–0.2)

## 2024-06-03 MED ORDER — HYDRALAZINE HCL 20 MG/ML IJ SOLN: 5.0000 mg | INTRAMUSCULAR | Status: DC | PRN

## 2024-06-03 MED ORDER — ACETAMINOPHEN 650 MG RE SUPP: 650.0000 mg | Freq: Four times a day (QID) | RECTAL | Status: DC | PRN

## 2024-06-03 MED ORDER — IOHEXOL 9 MG/ML PO SOLN
ORAL | Status: AC
Start: 2024-06-03 — End: 2024-06-03
  Filled 2024-06-03: qty 1000

## 2024-06-03 MED ORDER — ACETAMINOPHEN 325 MG PO TABS
325.0000 mg | ORAL_TABLET | Freq: Once | ORAL | Status: AC
  Administered 2024-06-03: 325 mg via ORAL
  Filled 2024-06-03: qty 1

## 2024-06-03 MED ORDER — IOHEXOL 9 MG/ML PO SOLN
1000.0000 mL | ORAL | Status: AC
  Administered 2024-06-03: 1000 mL via ORAL

## 2024-06-03 MED ORDER — ENOXAPARIN SODIUM 40 MG/0.4ML IJ SOSY
40.0000 mg | PREFILLED_SYRINGE | INTRAMUSCULAR | Status: DC
  Administered 2024-06-03: 40 mg via SUBCUTANEOUS
  Filled 2024-06-03: qty 0.4

## 2024-06-03 MED ORDER — OXYCODONE HCL 5 MG PO TABS
5.0000 mg | ORAL_TABLET | Freq: Once | ORAL | Status: AC
  Administered 2024-06-03: 5 mg via ORAL
  Filled 2024-06-03: qty 1

## 2024-06-03 MED ORDER — POLYETHYLENE GLYCOL 3350 17 G PO PACK: 17.0000 g | PACK | Freq: Every day | ORAL | Status: DC | PRN

## 2024-06-03 MED ORDER — BISACODYL 5 MG PO TBEC: 5.0000 mg | DELAYED_RELEASE_TABLET | Freq: Every day | ORAL | Status: DC | PRN

## 2024-06-03 MED ORDER — ONDANSETRON HCL 4 MG PO TABS: 4.0000 mg | ORAL_TABLET | Freq: Four times a day (QID) | ORAL | Status: DC | PRN

## 2024-06-03 MED ORDER — MORPHINE SULFATE (PF) 2 MG/ML IV SOLN
2.0000 mg | INTRAVENOUS | Status: DC | PRN
  Administered 2024-06-04: 2 mg via INTRAVENOUS
  Filled 2024-06-03: qty 1

## 2024-06-03 MED ORDER — SODIUM CHLORIDE (PF) 0.9 % IJ SOLN
INTRAMUSCULAR | Status: AC
Start: 2024-06-03 — End: 2024-06-03
  Filled 2024-06-03: qty 50

## 2024-06-03 MED ORDER — OXYCODONE HCL 5 MG PO TABS
5.0000 mg | ORAL_TABLET | ORAL | Status: DC | PRN
  Administered 2024-06-04: 5 mg via ORAL
  Filled 2024-06-03: qty 1

## 2024-06-03 MED ORDER — ACETAMINOPHEN 325 MG PO TABS: 650.0000 mg | ORAL_TABLET | Freq: Four times a day (QID) | ORAL | Status: DC | PRN

## 2024-06-03 MED ORDER — IOHEXOL 300 MG/ML  SOLN
100.0000 mL | Freq: Once | INTRAMUSCULAR | Status: AC | PRN
  Administered 2024-06-04: 100 mL via INTRAVENOUS

## 2024-06-03 MED ORDER — ONDANSETRON HCL 4 MG/2ML IJ SOLN: 4.0000 mg | Freq: Four times a day (QID) | INTRAMUSCULAR | Status: DC | PRN

## 2024-06-03 MED ORDER — DOCUSATE SODIUM 100 MG PO CAPS
100.0000 mg | ORAL_CAPSULE | Freq: Two times a day (BID) | ORAL | Status: DC
  Administered 2024-06-04: 100 mg via ORAL
  Filled 2024-06-03 (×2): qty 1

## 2024-06-03 NOTE — Plan of Care (Signed)
 Plan of Care Note for accepted transfer   Patient: Melanie Madden MRN: [id number]   DOA: (Not on file)  Facility requesting transfer: [facility] Requesting Provider: Dr. Cloretta  Reason for transfer: abd pain Facility course:  58 yo female with PMH stage IV appendiceal cancer (mets to B/L ovaries, omentum) s/p robotic assisted BSO-TAH, omentectomy, appendectomy (May 2023 with Dr. 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  Also PMH right nephrolithiasis with prior stent placement 2022.    Last PET scan 02/19/2024 showing 2 omental nodules without hypermetabolism.  Left adrenal adenoma.  Numerous bilateral renal stones.  Also noted she has had history of right pelvic floor pain and right lower quadrant abdominal pain in the past.  She saw Dr. Viktoria for this similar complaint in June 2025 and underwent CT A/P on 02/06/2024 which did not show any obvious acute explanation of her pain complaints.  Does mention couple of small nodules in the right omentum which could possibly represent fat necrosis or development of omental metastatic disease which would not be unexpected given her history.  Also possible that if these do enlarge, could possibly start to contribute to some worsening pain complaints.  She presented to cancer center on 06/03/2024 due to worsening right lower quadrant abdominal pain complaints.  She informed Dr. Cloretta that pain control and worsened abdominal pain was main reason for presentation and that she was amenable with hospitalization for pain control and further workup.  Plan of care: The patient is accepted for admission to Med-surg  unit, at [facility].  Author: Alm Apo, MD 06/03/2024 3:42 PM   **Nursing staff**, Please call [facility] Admits & Consults System-Wide number on Amion as soon as patient's arrival, so appropriate admitting provider can evaluate the pt.  Check  [email or web address] for on-call coverage

## 2024-06-03 NOTE — Assessment & Plan Note (Addendum)
 Followed by [facility] Has an appointment to see Dr. Cam in January As noted above, this would be an atypical presentation of stone-related pain

## 2024-06-03 NOTE — Assessment & Plan Note (Addendum)
 H/o CVA with significant deficits including R hemiplegia, expressive aphasia, and mood disturbance Appears to be stable at this time

## 2024-06-03 NOTE — Assessment & Plan Note (Addendum)
 Inconsistent yes/no answers with quick frustration [name]

## 2024-06-03 NOTE — Assessment & Plan Note (Addendum)
 Patient with known h/o stage IV appendiceal cancer as well as prior nephrolithiasis Developed subacute RLQ abdominal pain Seen in Cancer Center and sent for direct admission Caregiver reports that she was given 1 pill of Norco vs. Percocet at the clinic Now basically pain free other than ?pannus/superficial abdominal wall pain without significant obvious findings Would be an unusual presentation for kidney stones CBC/CMP ordered CT A/P ordered Will observe on med surg with possible plan for dc tomorrow Pain control with Tylenol , Tramadol , Oxy, morphine  as needed

## 2024-06-03 NOTE — Progress Notes (Signed)
 Pt admitted to the unit alongside husband and caregiver. Pt noted to have R sided weakness and uses cane for mobilization. Pt safely transferred to the bed. Pt yelling and angry. Pt refused vitals and to change into a gown.

## 2024-06-03 NOTE — Assessment & Plan Note (Addendum)
 Awaiting med rec to order home meds BP currently 140/82 so it is likely ok to wait until the AM

## 2024-06-03 NOTE — H&P (Signed)
 History and Physical    Patient: Melanie Madden FMW:[id number] DOB: [birth year] DOA: 06/03/2024 DOS: the patient was seen and examined on 06/03/2024 PCP: Cleotilde Planas, MD  Patient coming from: Home - lives with husband, also has a daytime caregiver; NOKBETHA Mulling, [phone]; Caregiver, Planas [phone]   Chief Complaint: Abdominal pain  HPI: Melanie Madden is a 58 y.o. 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  She has been complaining of RLQ pain.  Her caregiver took her to see Dr. Cloretta today.  She was in intense pain and he recommended that they bring her in.  He changed her pain medication and asked her if she needed to be hospitalized and she said yes.  He wanted to do another scan.  She has a h/o nephrolithiasis with nephrostomy tubes, sepsis in the past.  She does have recurrent stones and is scheduled to see Dr. Cam in January for kidney stone removal.   PET scan about 3 months ago with a few non-specific nodules, will need rescan.  Has chronic RUE deficits, aphasia, limited RLE movement.  Walks short distances with a cane.    ER Course:  Worsening abdominal pain, being admitted for pain control.     Review of Systems: unable to review all systems due to the inability of the patient to answer questions. Past Medical History:  Diagnosis Date   Allergy    Anxiety    Cancer of appendix (HCC) Stage 4    Depression    Expressive aphasia    Hemiparesis (HCC)    Right side   High triglycerides    History of kidney stones    Hypertension    Kidney stones    Recurrent sinus infections    Septic shock (HCC) 03/26/2023   Septic shock (HCC) 02/08/2021   Stroke (HCC)  07/09/2019   Past Surgical History:  Procedure Laterality Date   ANTERIOR CRUCIATE LIGAMENT REPAIR Left    BUNIONECTOMY Right    CYSTOSCOPY/URETEROSCOPY/HOLMIUM LASER/STENT PLACEMENT Right 02/08/2021   Procedure: CYSTOSCOPY/RETROGRADE/URETEROSCOPY/HOLMIUM LASER/STENT PLACEMENT, NEPHROSTOMY TUBE REMOVAL;  Surgeon: Devere Lonni Righter, MD;  Location: [facility];  Service: Urology;  Laterality: Right;   CYSTOSCOPY/URETEROSCOPY/HOLMIUM LASER/STENT PLACEMENT Bilateral 04/10/2023   Procedure: CYSTOSCOPY, BILATERAL URETEROSCOPY, BILATERAL RETROGRADE PYELOGRAM, HOLMIUM LASER LITHOTRIPSY, AND BILATERAL URETERAL STENT PLACEMENT, removal of percutanous stent post op;  Surgeon: Devere Lonni Righter, MD;  Location: [facility];  Service: Urology;  Laterality: Bilateral;  90 MINUTES   IR NEPHROSTOMY PLACEMENT RIGHT  12/09/2020   IR NEPHROSTOMY PLACEMENT RIGHT  03/27/2023   LASIK  2008   LIPOSUCTION     LITHOTRIPSY     MENISCUS REPAIR Left    NASAL SEPTUM SURGERY     ROBOTIC ASSISTED TOTAL HYSTERECTOMY WITH BILATERAL SALPINGO OOPHERECTOMY Bilateral 12/06/2021   Procedure: XI ROBOTIC ASSISTED BILATERAL SALPINGO OOPHORECTOMY, TOTAL HYSTERECTOMY, PERITONEAL BIOPSY, OMENTECTOMY, LAPAROTOMY, APPENDECTOMY, PAP SMEAR;  Surgeon: Viktoria Comer SAUNDERS, MD;  Location: [facility];  Service: Gynecology;  Laterality: Bilateral;   SHOULDER SURGERY Right    VARICOSE VEIN SURGERY Left    Social History:  reports that she has quit smoking. She has quit using smokeless tobacco. She reports current alcohol use. She reports that she does not use drugs.  Allergies  Allergen Reactions  Amoxicillin Nausea Only   Shellfish Allergy Diarrhea and Nausea And Vomiting    Mussels extreme n/v    Family History  Problem Relation Age of Onset   Alzheimer's disease Mother    Hypertension Mother    Diabetes Mother    Thyroid  disease Mother    Deep vein thrombosis Maternal Grandmother    Colon cancer Neg Hx     Colon polyps Neg Hx    Esophageal cancer Neg Hx    Pancreatic cancer Neg Hx    Rectal cancer Neg Hx    Stomach cancer Neg Hx    Breast cancer Neg Hx     Prior to Admission medications   Medication Sig Start Date End Date Taking? Authorizing Provider  amLODipine  (NORVASC ) 10 MG tablet Take 1 tablet (10 mg total) by mouth daily. Hold until follow-up with your doctor 04/01/23   Rai, Nydia POUR, MD  atorvastatin  (LIPITOR) 20 MG tablet Take 1 tablet (20 mg total) by mouth daily at 6 PM. 08/20/19   Angiulli, Toribio PARAS, PA-C  B Complex-C (B-COMPLEX WITH VITAMIN C) tablet Take 1 tablet by mouth daily.    [provider]  Bisacodyl  ([employer] C-LAX LAXATIVE PO) Take by mouth as needed (constiipation).    [provider]  Cholecalciferol (VITAMIN D) 50 MCG (2000 UT) CAPS Take 2,000 Units by mouth daily.    [provider]  doxycycline (VIBRA-TABS) 100 MG tablet Take 100 mg by mouth 2 (two) times daily. 03/25/24   [provider]  DULoxetine  (CYMBALTA ) 20 MG capsule Take 1 capsule (20 mg total) by mouth in the morning. Patient not taking: Reported on 03/26/2024 04/01/23   Rai, Nydia POUR, MD  ferrous sulfate 325 (65 FE) MG EC tablet Take 325 mg by mouth daily with breakfast. Patient taking differently: Take 325 mg by mouth as directed. Only takes on weekend    [provider]  gabapentin  (NEURONTIN ) 100 MG capsule 100 mg at bedtime. Patient not taking: Reported on 03/26/2024 02/27/23   [provider]  losartan (COZAAR) 100 MG tablet Take 100 mg by mouth daily. 03/06/24   [provider]  traMADol  (ULTRAM ) 50 MG tablet Take 1 tablet (50 mg total) by mouth every 12 (twelve) hours as needed for moderate pain or severe pain. 04/01/23   Rai, Ripudeep K, MD  VITAMIN E PO Take 1 tablet by mouth daily. Patient not taking: Reported on 03/26/2024    [provider]    Physical Exam: There were no vitals filed for this visit. General:  Appears calm and  comfortable and is in NAD, emotionally labile with outbursts Eyes:  R > L upper and lower lid styes without apparent conjunctival injection ENT:  grossly normal hearing, lips & tongue, mmm Cardiovascular:  RRR. No LE edema.  Respiratory:   CTA bilaterally with no wheezes/rales/rhonchi.  Normal respiratory effort. Abdomen:  soft, ND, RLQ pannus pain more than deep TTP, RUQ TTP Skin:  no rash or induration seen on limited exam Musculoskeletal:  grossly normal tone BUE/BLE, good ROM, no bony abnormality Psychiatric:  grossly normal mood and affect, speech fluent and appropriate, AOx3 Neurologic:  CN 2-12 grossly intact, moves all extremities in coordinated fashion   Radiological Exams on Admission: Independently reviewed - see discussion in A/P where applicable  No results found.  EKG: none   Labs on Admission: I have personally reviewed the available labs and imaging studies at the time of the admission.  Pertinent labs:  None  Assessment and Plan: Principal Problem:   RLQ abdominal pain Active Problems:   Essential hypertension   Global aphasia   Nontraumatic subcortical hemorrhage of left cerebral hemisphere [facility])   Nephrolithiasis    Assessment & Plan RLQ abdominal pain Patient with known h/o stage IV appendiceal cancer as well as prior nephrolithiasis Developed subacute RLQ abdominal pain Seen in Cancer Center and sent for direct admission Caregiver reports that she was given 1 pill of Norco vs. Percocet at the clinic Now basically pain free other than ?pannus/superficial abdominal wall pain without significant obvious findings Would be an unusual presentation for kidney stones CBC/CMP ordered CT A/P ordered Will observe on med surg with possible plan for dc tomorrow Pain control with Tylenol , Tramadol , Oxy, morphine  as needed Essential hypertension Awaiting med rec to order home meds BP currently 140/82 so it is likely ok to wait until the AM Global  aphasia Inconsistent yes/no answers with quick frustration [name] Nontraumatic subcortical hemorrhage of left cerebral hemisphere [facility]) H/o CVA with significant deficits including R hemiplegia, expressive aphasia, and mood disturbance Appears to be stable at this time Nephrolithiasis Followed by [facility] Has an appointment to see Dr. Cam in January As noted above, this would be an atypical presentation of stone-related pain      Advance Care Planning:   Code Status: Full Code - Code status was discussed with the patient and/or family at the time of admission.  The patient would want to receive full resuscitative measures at this time.    Consultants: Oncology   Procedures: None   Antibiotics: None    DVT Prophylaxis: Lovenox   Family Communication: Husband and caregiver were present  Severity of Illness: The appropriate patient status for this patient is OBSERVATION. Observation status is judged to be reasonable and necessary in order to provide the required intensity of service to ensure the patient's safety. The patient's presenting symptoms, physical exam findings, and initial radiographic and laboratory data in the context of their medical condition is felt to place them at decreased risk for further clinical deterioration. Furthermore, it is anticipated that the patient will be medically stable for discharge from the hospital within 2 midnights of admission.   Author: Delon Herald, MD 06/03/2024 5:58 PM  For on call review [email or web address].

## 2024-06-03 NOTE — Progress Notes (Signed)
 [facility] OFFICE PROGRESS NOTE   Diagnosis: Appendix normal  INTERVAL HISTORY:   [name] returns prior to a scheduled visit.  She is here with a caretaker.  She complains of constant intense right lower abdominal pain.  Tramadol  does not relieve the pain.  No difficulty with bowel function.  No nausea or vomiting.  No dysuria.  She is followed by Dr. Cam for kidney stones.  No bleeding.  Objective:  Vital signs in last 24 hours:  Blood pressure (!) 140/82, pulse 94, temperature 98.2 F (36.8 C), temperature source Temporal, resp. rate 18, height 5' 7 (1.702 m), SpO2 99%.    Lymphatics: No cervical, supraclavicular, axillary, or inguinal nodes Resp: Lungs clear bilaterally Cardio: Regular rate and rhythm GI: No hepatosplenomegaly, soft, tender in the right lower abdomen, no mass, no apparent hernia Vascular: No leg edema Neuro: Alert, right hemiplegia, expressive aphasia   Portacath/PICC-without erythema  Lab Results:  Lab Results  Component Value Date   WBC 6.6 11/28/2023   HGB 15.3 (H) 11/28/2023   HCT 47.9 (H) 11/28/2023   MCV 90.7 11/28/2023   PLT 192 11/28/2023   NEUTROABS 7.3 03/26/2023    CMP  Lab Results  Component Value Date   NA 140 01/09/2024   K 4.0 01/09/2024   CL 102 01/09/2024   CO2 23 01/09/2024   GLUCOSE 123 (H) 01/09/2024   BUN 19 01/09/2024   CREATININE 1.00 01/09/2024   CALCIUM  11.0 (H) 01/09/2024   PROT 7.3 11/28/2023   ALBUMIN 4.1 11/28/2023   AST 21 11/28/2023   ALT 21 11/28/2023   ALKPHOS 98 11/28/2023   BILITOT 0.9 11/28/2023   GFRNONAA >60 01/09/2024   GFRAA >60 12/19/2019    Lab Results  Component Value Date   CEA1 2.90 11/06/2021   CEA 2.70 01/09/2024  d the patient's current medications.   Assessment/Plan: Goblet cell adenocarcinoma of the appendix, stage IV, (pT4a,pNx,pM1c) 12/06/2021 TAH/BSO, omentum resection, cul-de-sac biopsy-metastatic poorly differentiated carcinoma with signet ring  cells involving bilateral ovaries, the omentum, anterior cul-de-sac biopsy suspicious for malignancy.  Resection margins negative. Foundation 1-microsatellite status cannot be determined, tumor mutation burden cannot be determined, [id number], KMT2D(MLL2) MRI abdomen and pelvis 10/26/2021-new large bilateral adnexal masses concerning for bilateral ovarian neoplasm, stable 4 cm left adrenal gland lesion consistent with a benign adenoma, no evidence of omental disease, no adenopathy PET 01/17/2022-no evidence of residual or recurrent appendiceal carcinoma, small cutaneous nodule at the left posterior thorax with mild hypermetabolism, left thyroid  goiter, 4 cm benign left adrenal adenoma CTs 08/06/2022-no evidence of progressive disease, fluid adjacent to the gallbladder fundus CTs 07/03/2023: No evidence of local recurrence or metastatic disease, stable small rim of fluid at the gallbladder fundus CT abdomen/pelvis 11/27/2023-stable bilateral nephrolithiasis without obstruction, minimal stranding at the mesentery anterior to the proximal transverse colon (minimal nodular stranding was noted anterior to the ascending colon on 07/03/2023) CT chest 01/09/2024-no evidence of metastatic disease, stable left adrenal gland mass, no change in left thyroid  multinodular goiter CT abdomen/pelvis 02/06/2024: 7 mm and 6.5 mm nodules in the right omentum Multi bilateral renal calculi PET 02/19/2024: 2 omental nodules without abnormal hypermetabolism, left adrenal adenoma, numerous bilateral renal stones 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   Nephrolithiasis 4.   Admission 12/09/2020 with urosepsis secondary to obstructing right renal stone, status post percutaneous nephrostomy placement Cystoscopy with laser lithotripsy and right JJ stent placement 02/08/2021 5.   Admission  Disposition: [name] has a history of goblet cell adenocarcinoma of  the appendix diagnosed in May 2023.  She was noted to have metastatic disease involving the ovaries and omentum at the time of diagnosis.  She has been followed with observation.  She has a history of right lower abdominal pain for the past 6 months.  She underwent a diagnostic evaluation this summer including a GYN exam, CT, and PET scan.  No explanation for the pain was found.  When I saw her 03/26/2024 the pain had improved. She presents today with consistent severe right abdominal pain.  No associated symptoms.  She requests hospital admission for pain control and further evaluation.  I contacted Dr. Patsy.  He agrees to admit her to the medical service at [facility].  She will undergo CT imaging.  The pain may be related to progressive carcinomatosis or a benign abdominal/pelvic process.  I will check on her 06/04/2024.  Arley Hof, MD  06/03/2024  2:56 PM

## 2024-06-03 NOTE — Progress Notes (Signed)
 Gave patient oxycodone  5 mg + Tylenol  325 mg at 15:26 for right abdominal pain rated 5 before going to [facility] for direct admission. Her caregiver is taking her to admitting at [facility]. She is stable.

## 2024-06-04 ENCOUNTER — Observation Stay (HOSPITAL_COMMUNITY): Admitting: Certified Registered"

## 2024-06-04 ENCOUNTER — Encounter (HOSPITAL_COMMUNITY)
Admission: AD | Disposition: A | Discharge status: Home / Self Care | Payer: Self-pay | Source: Ambulatory Visit | Attending: Internal Medicine

## 2024-06-04 ENCOUNTER — Encounter (HOSPITAL_COMMUNITY): Payer: Self-pay | Admitting: Internal Medicine

## 2024-06-04 ENCOUNTER — Observation Stay (HOSPITAL_BASED_OUTPATIENT_CLINIC_OR_DEPARTMENT_OTHER): Admitting: Certified Registered"

## 2024-06-04 ENCOUNTER — Other Ambulatory Visit: Payer: Self-pay

## 2024-06-04 ENCOUNTER — Observation Stay (HOSPITAL_COMMUNITY)

## 2024-06-04 DIAGNOSIS — N201 Calculus of ureter: Secondary | ICD-10-CM

## 2024-06-04 DIAGNOSIS — F418 Other specified anxiety disorders: Secondary | ICD-10-CM | POA: Diagnosis not present

## 2024-06-04 DIAGNOSIS — N132 Hydronephrosis with renal and ureteral calculous obstruction: Secondary | ICD-10-CM | POA: Diagnosis not present

## 2024-06-04 DIAGNOSIS — I10 Essential (primary) hypertension: Secondary | ICD-10-CM

## 2024-06-04 DIAGNOSIS — R1031 Right lower quadrant pain: Secondary | ICD-10-CM | POA: Diagnosis not present

## 2024-06-04 HISTORY — PX: CYSTOSCOPY W/ URETERAL STENT PLACEMENT: SHX1429

## 2024-06-04 LAB — BASIC METABOLIC PANEL WITH GFR
Anion gap: 11 (ref 5–15)
BUN: 20 mg/dL (ref 6–20)
CO2: 23 mmol/L (ref 22–32)
Calcium: 10.3 mg/dL (ref 8.9–10.3)
Chloride: 104 mmol/L (ref 98–111)
Creatinine, Ser: 0.98 mg/dL (ref 0.44–1.00)
GFR, Estimated: >60 mL/min (ref >60)
Glucose, Bld: 107 mg/dL — ABNORMAL HIGH (ref 70–99)
Potassium: 4 mmol/L (ref 3.5–5.1)
Sodium: 138 mmol/L (ref 135–145)

## 2024-06-04 LAB — URINALYSIS, W/ REFLEX TO CULTURE (INFECTION SUSPECTED)
Bacteria, UA: NONE SEEN
Bilirubin Urine: NEGATIVE
Glucose, UA: NEGATIVE mg/dL
Ketones, ur: NEGATIVE mg/dL
Nitrite: NEGATIVE
Protein, ur: NEGATIVE mg/dL
RBC / HPF: >50 RBC/hpf (ref 0–5)
Specific Gravity, Urine: 1.018 (ref 1.005–1.030)
pH: 7 (ref 5.0–8.0)

## 2024-06-04 LAB — HIV ANTIBODY (ROUTINE TESTING W REFLEX): HIV Screen 4th Generation wRfx: NONREACTIVE

## 2024-06-04 LAB — CBC
HCT: 46.3 % — ABNORMAL HIGH (ref 36.0–46.0)
Hemoglobin: 14.6 g/dL (ref 12.0–15.0)
MCH: 29 pg (ref 26.0–34.0)
MCHC: 31.5 g/dL (ref 30.0–36.0)
MCV: 91.9 fL (ref 80.0–100.0)
Platelets: 168 K/uL (ref 150–400)
RBC: 5.04 MIL/uL (ref 3.87–5.11)
RDW: 13.6 % (ref 11.5–15.5)
WBC: 5.2 K/uL (ref 4.0–10.5)
nRBC: 0 % (ref 0.0–0.2)

## 2024-06-04 SURGERY — CYSTOSCOPY, WITH RETROGRADE PYELOGRAM AND URETERAL STENT INSERTION
Anesthesia: General | Laterality: Right

## 2024-06-04 MED ORDER — ONDANSETRON HCL 4 MG/2ML IJ SOLN
INTRAMUSCULAR | Status: AC
  Filled 2024-06-04: qty 2

## 2024-06-04 MED ORDER — FENTANYL CITRATE (PF) 50 MCG/ML IJ SOSY
25.0000 ug | PREFILLED_SYRINGE | INTRAMUSCULAR | Status: DC | PRN
  Administered 2024-06-04 (×2): 50 ug via INTRAVENOUS

## 2024-06-04 MED ORDER — LIDOCAINE HCL (CARDIAC) PF 100 MG/5ML IV SOSY
PREFILLED_SYRINGE | INTRAVENOUS | Status: DC | PRN
  Administered 2024-06-04: 60 mg via INTRAVENOUS

## 2024-06-04 MED ORDER — ACETAMINOPHEN 325 MG PO TABS: 650.0000 mg | ORAL_TABLET | Freq: Four times a day (QID) | ORAL | Status: DC | PRN

## 2024-06-04 MED ORDER — FENTANYL CITRATE (PF) 100 MCG/2ML IJ SOLN
INTRAMUSCULAR | Status: AC
  Filled 2024-06-04: qty 2

## 2024-06-04 MED ORDER — ACETAMINOPHEN 10 MG/ML IV SOLN
INTRAVENOUS | Status: AC
  Filled 2024-06-04: qty 100

## 2024-06-04 MED ORDER — DROPERIDOL 2.5 MG/ML IJ SOLN: 0.6250 mg | Freq: Once | INTRAMUSCULAR | Status: DC | PRN

## 2024-06-04 MED ORDER — FENTANYL CITRATE (PF) 100 MCG/2ML IJ SOLN
INTRAMUSCULAR | Status: DC | PRN
  Administered 2024-06-04: 50 ug via INTRAVENOUS

## 2024-06-04 MED ORDER — PROPOFOL 10 MG/ML IV BOLUS
INTRAVENOUS | Status: DC | PRN
  Administered 2024-06-04: 100 mg via INTRAVENOUS
  Administered 2024-06-04: 60 mg via INTRAVENOUS

## 2024-06-04 MED ORDER — CEFAZOLIN SODIUM-DEXTROSE 2-4 GM/100ML-% IV SOLN
2.0000 g | Freq: Once | INTRAVENOUS | Status: AC
  Administered 2024-06-04: 2 g via INTRAVENOUS
  Filled 2024-06-04: qty 100

## 2024-06-04 MED ORDER — ATORVASTATIN CALCIUM 20 MG PO TABS: 20.0000 mg | ORAL_TABLET | Freq: Every morning | ORAL | Status: DC

## 2024-06-04 MED ORDER — ACETAMINOPHEN 10 MG/ML IV SOLN
1000.0000 mg | Freq: Once | INTRAVENOUS | Status: DC | PRN
  Administered 2024-06-04: 1000 mg via INTRAVENOUS

## 2024-06-04 MED ORDER — MIDAZOLAM HCL 2 MG/2ML IJ SOLN
INTRAMUSCULAR | Status: AC
  Filled 2024-06-04: qty 2

## 2024-06-04 MED ORDER — FENTANYL CITRATE (PF) 50 MCG/ML IJ SOSY
PREFILLED_SYRINGE | INTRAMUSCULAR | Status: AC
  Filled 2024-06-04: qty 1

## 2024-06-04 MED ORDER — PHENYLEPHRINE 80 MCG/ML (10ML) SYRINGE FOR IV PUSH (FOR BLOOD PRESSURE SUPPORT)
PREFILLED_SYRINGE | INTRAVENOUS | Status: DC | PRN
  Administered 2024-06-04 (×2): 80 ug via INTRAVENOUS

## 2024-06-04 MED ORDER — OXYCODONE HCL 5 MG PO TABS: 5.0000 mg | ORAL_TABLET | ORAL | 0 refills | Status: DC | PRN

## 2024-06-04 MED ORDER — ONDANSETRON HCL 4 MG/2ML IJ SOLN
INTRAMUSCULAR | Status: DC | PRN
  Administered 2024-06-04: 4 mg via INTRAVENOUS

## 2024-06-04 MED ORDER — SODIUM CHLORIDE (PF) 0.9 % IJ SOLN
INTRAMUSCULAR | Status: DC | PRN
  Administered 2024-06-04: 3000 mL

## 2024-06-04 MED ORDER — DEXAMETHASONE SOD PHOSPHATE PF 10 MG/ML IJ SOLN
INTRAMUSCULAR | Status: DC | PRN
  Administered 2024-06-04: 8 mg via INTRAVENOUS

## 2024-06-04 MED ORDER — IOHEXOL 300 MG/ML  SOLN
INTRAMUSCULAR | Status: DC | PRN
  Administered 2024-06-04: 7 mL

## 2024-06-04 MED ORDER — LOSARTAN POTASSIUM 50 MG PO TABS
100.0000 mg | ORAL_TABLET | Freq: Every day | ORAL | Status: DC
  Administered 2024-06-04: 100 mg via ORAL
  Filled 2024-06-04: qty 2

## 2024-06-04 MED ORDER — MIDAZOLAM HCL (PF) 2 MG/2ML IJ SOLN
INTRAMUSCULAR | Status: DC | PRN
  Administered 2024-06-04 (×2): 1 mg via INTRAVENOUS

## 2024-06-04 MED ORDER — PROPOFOL 10 MG/ML IV BOLUS
INTRAVENOUS | Status: AC
  Filled 2024-06-04: qty 20

## 2024-06-04 MED ORDER — LACTATED RINGERS IV SOLN: INTRAVENOUS | Status: DC | PRN

## 2024-06-04 SURGICAL SUPPLY — 14 items
BAG URO CATCHER STRL LF (MISCELLANEOUS) ×1 IMPLANT
CATH URETL OPEN 5X70 (CATHETERS) IMPLANT
CLOTH BEACON ORANGE TIMEOUT ST (SAFETY) ×1 IMPLANT
GLOVE BIOGEL M 7.0 STRL (GLOVE) ×1 IMPLANT
GOWN STRL REUS W/ TWL LRG LVL3 (GOWN DISPOSABLE) ×1 IMPLANT
GUIDEWIRE STR DUAL SENSOR (WIRE) ×1 IMPLANT
GUIDEWIRE ZIPWRE .038 STRAIGHT (WIRE) IMPLANT
KIT TURNOVER KIT A (KITS) ×1 IMPLANT
MANIFOLD NEPTUNE II (INSTRUMENTS) ×1 IMPLANT
PACK CYSTO (CUSTOM PROCEDURE TRAY) ×1 IMPLANT
SHEATH DILATOR SET 8/10 (MISCELLANEOUS) IMPLANT
STENT URET 6FRX24 CONTOUR (STENTS) IMPLANT
SYR 10ML LL (SYRINGE) ×1 IMPLANT
TUBING CONNECTING 10 (TUBING) ×1 IMPLANT

## 2024-06-04 NOTE — Transfer of Care (Signed)
 Immediate Anesthesia Transfer of Care Note  Patient: Melanie Madden  Procedure(s) Performed: CYSTOSCOPY, WITH RETROGRADE PYELOGRAM AND URETERAL STENT INSERTION (Right)  Patient Location: PACU  Anesthesia Type:General  Level of Consciousness: sedated  Airway & Oxygen Therapy: Patient Spontanous Breathing and Patient connected to face mask oxygen  Post-op Assessment: Report given to RN and Post -op Vital signs reviewed and stable  Post vital signs: Reviewed and stable  Last Vitals:  Vitals Value Taken Time  BP 137/99 06/04/24 14:52  Temp    Pulse 81 06/04/24 14:54  Resp 19 06/04/24 14:54  SpO2 100 % 06/04/24 14:54  Vitals shown include unfiled device data.  Last Pain:  Vitals:   06/04/24 1328  TempSrc: Oral  PainSc: 0-No pain         Complications: No notable events documented.

## 2024-06-04 NOTE — Assessment & Plan Note (Addendum)
 Continue losartan No longer taking amlodipine 

## 2024-06-04 NOTE — Assessment & Plan Note (Addendum)
 H/o CVA with significant deficits including R hemiplegia, expressive aphasia, and mood disturbance Appears to be stable at this time Continue atorvastatin 

## 2024-06-04 NOTE — Assessment & Plan Note (Signed)
 Inconsistent yes/no answers with quick frustration [name]

## 2024-06-04 NOTE — Hospital Course (Signed)
 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 Pain is now controlled.  Imaging showed an 8mm stone in the proximal R ureter with mild R-sided hydronephrosis which may be the source of her pain.  Urology consulted.

## 2024-06-04 NOTE — Assessment & Plan Note (Addendum)
 Patient with known h/o stage IV appendiceal cancer as well as prior nephrolithiasis Developed subacute RLQ abdominal pain Seen in [facility] Center and sent for direct admission Caregiver reports that she was given 1 pill of Norco vs. Percocet at the clinic Now basically pain free other than ?pannus/superficial abdominal wall pain without significant obvious findings CT A/P ordered and showed 8mm R ureteral stone that appears to correlate with pain Pain control with Tylenol , Tramadol  at home

## 2024-06-04 NOTE — Discharge Summary (Signed)
 Physician Discharge Summary   Patient: Melanie Madden MRN: [id number] DOB: 11/16/1965  Admit date:     06/03/2024  Discharge date: 06/04/24  Discharge Physician: Delon Herald   PCP: Cleotilde Planas, MD   Recommendations at discharge:   You are being prescribed a limited number of narcotic pain pills; take as directed and do not drive or make important decisions while taking this medication  Follow up with Dr. Cleotilde in 1-2 weeks Follow up with Dr. Cloretta as directed Follow up with Dr. 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 Pain is now controlled.  Imaging showed an 8mm stone in the proximal R ureter with mild R-sided hydronephrosis which may be the source of her pain.  Urology consulted.  Assessment and Plan:  Assessment & Plan RLQ abdominal pain Patient with known h/o stage IV appendiceal cancer as well as prior nephrolithiasis Developed subacute RLQ abdominal pain Seen in Cancer Center and sent for direct admission Caregiver reports that she was given 1 pill of Norco vs. Percocet at the clinic Now basically pain free other than ?pannus/superficial abdominal wall pain without significant obvious findings CT A/P ordered and showed 8mm R ureteral stone that appears to correlate with pain Pain control with Tylenol , Tramadol  at home Essential hypertension Continue losartan No longer taking amlodipine  Global aphasia Inconsistent yes/no answers with quick  frustration [name] Nontraumatic subcortical hemorrhage of left cerebral hemisphere [facility]) H/o CVA with significant deficits including R hemiplegia, expressive aphasia, and mood disturbance Appears to be stable at this time Continue atorvastatin  Nephrolithiasis Followed by [facility] Has an appointment to see Dr. Cam in January S/p cystoscopy with stent today Will need outpatient f/u with urology     Consultants: Oncology Urology   Procedures: None   Antibiotics: None    Pain control - [place]  Controlled Substance Reporting System database was reviewed. and patient was instructed, not to drive, operate heavy machinery, perform activities at heights, swimming or participation in water  activities or provide baby-sitting services while on Pain, Sleep and Anxiety Medications; until their outpatient Physician has advised to do so again. Also recommended to not to take more than prescribed Pain, Sleep and Anxiety Medications.   Disposition: Home Diet recommendation:  Regular diet DISCHARGE MEDICATION: Allergies as of 06/04/2024       Reactions   Amoxicillin Nausea Only   Shellfish Allergy Diarrhea, Nausea And Vomiting, Other (See Comments)   Mussels extreme n/v        Medication List     TAKE these medications    acetaminophen  325 MG tablet Commonly known as: TYLENOL  Take 2 tablets (650 mg total) by mouth every 6 (six) hours as needed for mild pain (pain score 1-3) (or Fever >/= 101).   Aleve  220 MG tablet Generic drug: naproxen  sodium Take 440 mg by mouth daily with breakfast.   atorvastatin  20 MG tablet Commonly known as: LIPITOR Take 1 tablet (20 mg total) by mouth daily at 6 PM. What changed: when to  take this   losartan 100 MG tablet Commonly known as: COZAAR Take 100 mg by mouth daily.   oxyCODONE  5 MG immediate release tablet Commonly known as: Oxy IR/ROXICODONE  Take 1 tablet (5 mg total)  by mouth every 4 (four) hours as needed for moderate pain (pain score 4-6).   polyethylene glycol powder 17 GM/SCOOP powder Commonly known as: GLYCOLAX /MIRALAX  Take 8.5 g by mouth See admin instructions. Dissolve 8.5 grams in 4-8 ounces of liquid and drink by mouth in the morning        Follow-up Information     Cleotilde Planas, MD. Schedule an appointment as soon as possible for a visit in 1 week(s).   Specialty: Family Medicine Contact information: [address] [place] [address] [phone]         Selma Donnice SAUNDERS, MD. Schedule an appointment as soon as possible for a visit in 2 week(s).   Specialty: Urology Contact information: [address] [place] [address] [phone]         Cloretta Arley NOVAK, MD. Schedule an appointment as soon as possible for a visit.   Specialty: Oncology Contact information: [address] [place] [address] [phone]                Discharge Exam:  Subjective: Feeling better today.  Pain controlled.   Objective: Vitals:   06/04/24 1545 06/04/24 1620  BP: (!) 166/98 (!) 195/110  Pulse: 70 85  Resp: 20 20  Temp:    SpO2: 97% 95%   Exam:  General:  Appears calm and comfortable and is in NAD Eyes:  normal lids, iris ENT:  grossly normal hearing, lips & tongue, mmm Cardiovascular:  RRR, no m/r/g. No LE edema.  Respiratory:   CTA bilaterally with no wheezes/rales/rhonchi.  Normal respiratory effort. Abdomen:  soft, mild BLQ TTP, ND Skin:  no rash or induration seen on limited exam Musculoskeletal:  chronic R-sided deficits Psychiatric:  pleasant affect, aphasic Neurologic:  unable to effectively perform  Data Reviewed: I have reviewed the patient's lab results since admission.  Pertinent labs for today include:  Unremarkable BMP Stable CBC    Condition at discharge: improving  The results of significant diagnostics from this hospitalization (including imaging, microbiology, ancillary and  laboratory) are listed below for reference.   Imaging Studies: DG C-Arm 1-60 Min-No Report Result Date: 06/04/2024 Fluoroscopy was utilized by the requesting physician.  No radiographic interpretation.   CT ABDOMEN PELVIS W CONTRAST Result Date: 06/04/2024 EXAM: CT ABDOMEN AND PELVIS WITH CONTRAST 06/04/2024 12:22:37 AM TECHNIQUE: CT of the abdomen and pelvis was performed with the administration of 100 mL of iohexol  (OMNIPAQUE ) 300 MG/ML solution. Multiplanar reformatted images are provided for review. Automated exposure control, iterative reconstruction, and/or weight-based adjustment of the mA/kV was utilized to reduce the radiation dose to as low as reasonably achievable. COMPARISON: CT abdomen and pelvis 02/06/2024 CLINICAL HISTORY: Abdominal pain, acute, nonlocalized. FINDINGS: LOWER CHEST: No acute abnormality. LIVER: The liver is unremarkable. GALLBLADDER AND BILE DUCTS: Gallbladder is unremarkable. No biliary ductal dilatation. SPLEEN: Calcified granuloma in the spleen. PANCREAS: No acute abnormality. 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 This was previously characterized as adenoma. The right adrenal gland is within normal limits. KIDNEYS, URETERS AND BLADDER: There is an 8 mm calculus in the proximal right ureter. There is mild right-sided hydronephrosis. Additional bilateral renal calculi are present with the largest in the superior pole of the right kidney measuring  15 mm. There is no left-sided hydronephrosis. There is no perinephric fat stranding. Bilateral renal cortical scarring again seen. Urinary bladder is unremarkable. GI AND BOWEL: Stomach demonstrates no acute abnormality. The appendix is not visualized. There is no bowel obstruction. PERITONEUM AND RETROPERITONEUM: There are few right-sided omental nodules measuring up to 6 mm which appear unchanged from prior. No ascites. No free air. VASCULATURE: Aorta is normal in caliber. LYMPH  NODES: No lymphadenopathy. REPRODUCTIVE ORGANS: Uterus is surgically absent. BONES AND SOFT TISSUES: Moderate chronic compression deformity of L3 is unchanged. Multilevel degenerative changes affect the spine. No focal soft tissue abnormality. IMPRESSION: 1. 8 mm calculus in the proximal right ureter with mild right-sided hydronephrosis. 2. Additional bilateral renal calculi. 3. Stable small right-sided omental nodules. 4. 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 Given size 4 cm, recommend biochemical evaluation and surgical consultation prior to considering resection, or further characterization with adrenal washout CT or chemical-shift MR if not already definitively characterized. Electronically signed by: Greig Pique MD 06/04/2024 12:30 AM EST RP Workstation: [id number]    Microbiology: Results for orders placed or performed during the hospital encounter of 03/26/23  Blood Culture (routine x 2)     Status: Abnormal   Collection Time: 03/26/23 11:38 PM   Specimen: BLOOD  Result Value Ref Range Status   Specimen Description BLOOD LEFT ANTECUBITAL  Final   Special Requests   Final    BOTTLES DRAWN AEROBIC AND ANAEROBIC Blood Culture results may not be optimal due to an excessive volume of blood received in culture bottles   Culture  Setup Time   Final    GRAM NEGATIVE RODS IN BOTH AEROBIC AND ANAEROBIC BOTTLES CRITICAL RESULT CALLED TO, READ BACK BY AND VERIFIED WITH: MAYA EMERSON SQUIBB [id number] @1620  FH Performed at [facility], [address] [place], [place] [address]    Culture ESCHERICHIA COLI (A)  Final   Report Status 03/29/2023 FINAL  Final   Organism ID, Bacteria ESCHERICHIA COLI  Final      Susceptibility   Escherichia coli - MIC*    AMPICILLIN <=2 SENSITIVE Sensitive     CEFEPIME  <=0.12 SENSITIVE Sensitive     CEFTAZIDIME <=1 SENSITIVE Sensitive     CEFTRIAXONE  <=0.25 SENSITIVE Sensitive     CIPROFLOXACIN  <=0.25  SENSITIVE Sensitive     GENTAMICIN  <=1 SENSITIVE Sensitive     IMIPENEM <=0.25 SENSITIVE Sensitive     TRIMETH /SULFA  <=20 SENSITIVE Sensitive     AMPICILLIN/SULBACTAM <=2 SENSITIVE Sensitive     PIP/TAZO <=4 SENSITIVE Sensitive     * ESCHERICHIA COLI  Blood Culture ID Panel (Reflexed)     Status: Abnormal   Collection Time: 03/26/23 11:38 PM  Result Value Ref Range Status   Enterococcus faecalis NOT DETECTED NOT DETECTED Final   Enterococcus Faecium NOT DETECTED NOT DETECTED Final   Listeria monocytogenes NOT DETECTED NOT DETECTED Final   Staphylococcus species NOT DETECTED NOT DETECTED Final   Staphylococcus aureus (BCID) NOT DETECTED NOT DETECTED Final   Staphylococcus epidermidis NOT DETECTED NOT DETECTED Final   Staphylococcus lugdunensis NOT DETECTED NOT DETECTED Final   Streptococcus species NOT DETECTED NOT DETECTED Final   Streptococcus agalactiae NOT DETECTED NOT DETECTED Final   Streptococcus pneumoniae NOT DETECTED NOT DETECTED Final   Streptococcus pyogenes NOT DETECTED NOT DETECTED Final   A.calcoaceticus-baumannii NOT DETECTED NOT DETECTED Final   Bacteroides fragilis NOT DETECTED NOT DETECTED Final   Enterobacterales DETECTED (A) NOT DETECTED Final  Comment: Enterobacterales represent a large order of gram negative bacteria, not a single organism. CRITICAL RESULT CALLED TO, READ BACK BY AND VERIFIED WITH: PHARMD M. SHAUNNA [id number] @1620  FH    Enterobacter cloacae complex NOT DETECTED NOT DETECTED Final   Escherichia coli DETECTED (A) NOT DETECTED Final    Comment: CRITICAL RESULT CALLED TO, READ BACK BY AND VERIFIED WITH: PHARMD M. SHAUNNA [id number] @1620  FH    Klebsiella aerogenes NOT DETECTED NOT DETECTED Final   Klebsiella oxytoca NOT DETECTED NOT DETECTED Final   Klebsiella pneumoniae NOT DETECTED NOT DETECTED Final   Proteus species NOT DETECTED NOT DETECTED Final   Salmonella species NOT DETECTED NOT DETECTED Final   Serratia marcescens NOT DETECTED NOT DETECTED Final    Haemophilus influenzae NOT DETECTED NOT DETECTED Final   Neisseria meningitidis NOT DETECTED NOT DETECTED Final   Pseudomonas aeruginosa NOT DETECTED NOT DETECTED Final   Stenotrophomonas maltophilia NOT DETECTED NOT DETECTED Final   Candida albicans NOT DETECTED NOT DETECTED Final   Candida auris NOT DETECTED NOT DETECTED Final   Candida glabrata NOT DETECTED NOT DETECTED Final   Candida krusei NOT DETECTED NOT DETECTED Final   Candida parapsilosis NOT DETECTED NOT DETECTED Final   Candida tropicalis NOT DETECTED NOT DETECTED Final   Cryptococcus neoformans/gattii NOT DETECTED NOT DETECTED Final   CTX-M ESBL NOT DETECTED NOT DETECTED Final   Carbapenem resistance IMP NOT DETECTED NOT DETECTED Final   Carbapenem resistance KPC NOT DETECTED NOT DETECTED Final   Carbapenem resistance NDM NOT DETECTED NOT DETECTED Final   Carbapenem resist OXA 48 LIKE NOT DETECTED NOT DETECTED Final   Carbapenem resistance VIM NOT DETECTED NOT DETECTED Final    Comment: Performed at [facility], [address], [place], [place] [address]  Blood Culture (routine x 2)     Status: Abnormal   Collection Time: 03/26/23 11:40 PM   Specimen: BLOOD RIGHT FOREARM  Result Value Ref Range Status   Specimen Description BLOOD RIGHT FOREARM  Final   Special Requests   Final    BOTTLES DRAWN AEROBIC AND ANAEROBIC Blood Culture adequate volume   Culture  Setup Time   Final    GRAM NEGATIVE RODS IN BOTH AEROBIC AND ANAEROBIC BOTTLES CRITICAL RESULT CALLED TO, READ BACK BY AND VERIFIED WITH: PHARMD M. SHAUNNA [id number] @1620  FH    Culture (A)  Final    ESCHERICHIA COLI SUSCEPTIBILITIES PERFORMED ON PREVIOUS CULTURE WITHIN THE LAST 5 DAYS. Performed at [facility], [address], [place], [place] [address]    Report Status 03/31/2023 FINAL  Final  Urine Culture     Status: Abnormal   Collection Time: 03/27/23 12:11 AM   Specimen: Urine, Catheterized  Result Value Ref Range Status   Specimen Description URINE,  CATHETERIZED  Final   Special Requests   Final    NONE Performed at [facility], [address], [place], [place] [address]    Culture >=100,000 COLONIES/mL ESCHERICHIA COLI (A)  Final   Report Status 03/29/2023 FINAL  Final   Organism ID, Bacteria ESCHERICHIA COLI (A)  Final      Susceptibility   Escherichia coli - MIC*    AMPICILLIN <=2 SENSITIVE Sensitive     CEFAZOLIN  <=4 SENSITIVE Sensitive     CEFEPIME  <=0.12 SENSITIVE Sensitive     CEFTRIAXONE  <=0.25 SENSITIVE Sensitive     CIPROFLOXACIN  <=0.25 SENSITIVE Sensitive     GENTAMICIN  <=1 SENSITIVE Sensitive     IMIPENEM <=0.25 SENSITIVE Sensitive  NITROFURANTOIN <=16 SENSITIVE Sensitive     TRIMETH /SULFA  <=20 SENSITIVE Sensitive     AMPICILLIN/SULBACTAM <=2 SENSITIVE Sensitive     PIP/TAZO <=4 SENSITIVE Sensitive     * >=100,000 COLONIES/mL ESCHERICHIA COLI  Resp panel by RT-PCR (RSV, Flu A&B, Covid) Urine, Clean Catch     Status: None   Collection Time: 03/27/23 12:15 AM   Specimen: Urine, Clean Catch; Nasal Swab  Result Value Ref Range Status   SARS Coronavirus 2 by RT PCR NEGATIVE NEGATIVE Final   Influenza A by PCR NEGATIVE NEGATIVE Final   Influenza B by PCR NEGATIVE NEGATIVE Final    Comment: (NOTE) The Xpert Xpress SARS-CoV-2/FLU/RSV plus assay is intended as an aid in the diagnosis of influenza from Nasopharyngeal swab specimens and should not be used as a sole basis for treatment. Nasal washings and aspirates are unacceptable for Xpert Xpress SARS-CoV-2/FLU/RSV testing.  Fact Sheet for Patients: bloggercourse.com  Fact Sheet for Healthcare Providers: seriousbroker.it  This test is not yet approved or cleared by the [place]  FDA and has been authorized for detection and/or diagnosis of SARS-CoV-2 by FDA under an Emergency Use Authorization (EUA). This EUA will remain in effect (meaning this test can be used) for the duration of the COVID-19  declaration under Section 564(b)(1) of the Act, 21 U.S.C. section 360bbb-3(b)(1), unless the authorization is terminated or revoked.     Resp Syncytial Virus by PCR NEGATIVE NEGATIVE Final    Comment: (NOTE) Fact Sheet for Patients: bloggercourse.com  Fact Sheet for Healthcare Providers: seriousbroker.it  This test is not yet approved or cleared by the [place]  FDA and has been authorized for detection and/or diagnosis of SARS-CoV-2 by FDA under an Emergency Use Authorization (EUA). This EUA will remain in effect (meaning this test can be used) for the duration of the COVID-19 declaration under Section 564(b)(1) of the Act, 21 U.S.C. section 360bbb-3(b)(1), unless the authorization is terminated or revoked.  Performed at [facility], [address] [place], [place] [address]   MRSA Next Gen by PCR, Nasal     Status: None   Collection Time: 03/27/23  2:43 AM   Specimen: Nasal Mucosa; Nasal Swab  Result Value Ref Range Status   MRSA by PCR Next Gen NOT DETECTED NOT DETECTED Final    Comment: (NOTE) The GeneXpert MRSA Assay (FDA approved for NASAL specimens only), is one component of a comprehensive MRSA colonization surveillance program. It is not intended to diagnose MRSA infection nor to guide or monitor treatment for MRSA infections. Test performance is not FDA approved in patients less than 2 years old. Performed at [facility], [address], [place], [place] [address]   Aerobic/Anaerobic Culture w Gram Stain (surgical/deep wound)     Status: None   Collection Time: 03/27/23  4:04 AM   Specimen: Other Source; Urine  Result Value Ref Range Status   Specimen Description OTHER  Final   Special Requests Kidney R  Final   Gram Stain   Final    MODERATE WBC PRESENT,BOTH PMN AND MONONUCLEAR FEW GRAM NEGATIVE RODS    Culture   Final    ABUNDANT ESCHERICHIA COLI NO ANAEROBES ISOLATED Performed at [facility], [address], [place], [place] [address]    Report Status 04/01/2023 FINAL  Final   Organism ID, Bacteria ESCHERICHIA COLI  Final      Susceptibility   Escherichia coli - MIC*    AMPICILLIN <=2 SENSITIVE Sensitive  CEFEPIME  <=0.12 SENSITIVE Sensitive     CEFTAZIDIME <=1 SENSITIVE Sensitive     CEFTRIAXONE  <=0.25 SENSITIVE Sensitive     CIPROFLOXACIN  <=0.25 SENSITIVE Sensitive     GENTAMICIN  <=1 SENSITIVE Sensitive     IMIPENEM <=0.25 SENSITIVE Sensitive     TRIMETH /SULFA  <=20 SENSITIVE Sensitive     AMPICILLIN/SULBACTAM <=2 SENSITIVE Sensitive     PIP/TAZO <=4 SENSITIVE Sensitive     * ABUNDANT ESCHERICHIA COLI    Labs: CBC: Recent Labs  Lab 06/03/24 2114 06/04/24 0648  WBC 5.9 5.2  HGB 14.3 14.6  HCT 44.6 46.3*  MCV 91.4 91.9  PLT 170 168   Basic Metabolic Panel: Recent Labs  Lab 06/03/24 2114 06/04/24 0648  NA 138 138  K 4.1 4.0  CL 103 104  CO2 23 23  GLUCOSE 155* 107*  BUN 22* 20  CREATININE 1.06* 0.98  CALCIUM  10.3 10.3   Liver Function Tests: Recent Labs  Lab 06/03/24 2114  AST 25  ALT 25  ALKPHOS 100  BILITOT 0.4  PROT 6.7  ALBUMIN 3.7   CBG: No results for input(s): GLUCAP in the last 168 hours.  Discharge time spent: greater than 30 minutes.  Signed: Delon Herald, MD [facility] 06/04/2024

## 2024-06-04 NOTE — Progress Notes (Addendum)
 IP PROGRESS NOTE  Subjective:   [name] was admitted yesterday with severe right lower abdominal pain.  She reports persistent pain.  Her husband was at the bedside when I saw her at approximately 6:30 AM.  He reports she has urinary frequency.  She denies back pain.  Objective: Vital signs in last 24 hours: Blood pressure (!) 159/87, pulse 72, temperature (!) 97.5 F (36.4 C), temperature source Oral, resp. rate 18, height 5' 7 (1.702 m), SpO2 99%.  Intake/Output from previous day: No intake/output data recorded.  Physical Exam:  Abdomen: Soft, no hepatosplenomegaly, no masses, tender at the right lower abdomen Musculoskeletal: No flank tenderness Neurologic: Alert, follows commands, right hemiplegia, expressive aphasia  Portacath/PICC-without erythema  Lab Results: Recent Labs    06/03/24 2114 06/04/24 0648  WBC 5.9 5.2  HGB 14.3 14.6  HCT 44.6 46.3*  PLT 170 168    BMET Recent Labs    06/03/24 2114 06/04/24 0648  NA 138 138  K 4.1 4.0  CL 103 104  CO2 23 23  GLUCOSE 155* 107*  BUN 22* 20  CREATININE 1.06* 0.98  CALCIUM  10.3 10.3    Lab Results  Component Value Date   CEA1 2.90 11/06/2021   CEA 2.70 01/09/2024    Studies/Results: CT ABDOMEN PELVIS W CONTRAST Result Date: 06/04/2024 EXAM: CT ABDOMEN AND PELVIS WITH CONTRAST 06/04/2024 12:22:37 AM TECHNIQUE: CT of the abdomen and pelvis was performed with the administration of 100 mL of iohexol  (OMNIPAQUE ) 300 MG/ML solution. Multiplanar reformatted images are provided for review. Automated exposure control, iterative reconstruction, and/or weight-based adjustment of the mA/kV was utilized to reduce the radiation dose to as low as reasonably achievable. COMPARISON: CT abdomen and pelvis 02/06/2024 CLINICAL HISTORY: Abdominal pain, acute, nonlocalized. FINDINGS: LOWER CHEST: No acute abnormality. LIVER: The liver is unremarkable. GALLBLADDER AND BILE DUCTS: Gallbladder is unremarkable. No biliary ductal  dilatation. SPLEEN: Calcified granuloma in the spleen. PANCREAS: No acute abnormality. 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 This was previously characterized as adenoma. The right adrenal gland is within normal limits. KIDNEYS, URETERS AND BLADDER: There is an 8 mm calculus in the proximal right ureter. There is mild right-sided hydronephrosis. Additional bilateral renal calculi are present with the largest in the superior pole of the right kidney measuring 15 mm. There is no left-sided hydronephrosis. There is no perinephric fat stranding. Bilateral renal cortical scarring again seen. Urinary bladder is unremarkable. GI AND BOWEL: Stomach demonstrates no acute abnormality. The appendix is not visualized. There is no bowel obstruction. PERITONEUM AND RETROPERITONEUM: There are few right-sided omental nodules measuring up to 6 mm which appear unchanged from prior. No ascites. No free air. VASCULATURE: Aorta is normal in caliber. LYMPH NODES: No lymphadenopathy. REPRODUCTIVE ORGANS: Uterus is surgically absent. BONES AND SOFT TISSUES: Moderate chronic compression deformity of L3 is unchanged. Multilevel degenerative changes affect the spine. No focal soft tissue abnormality. IMPRESSION: 1. 8 mm calculus in the proximal right ureter with mild right-sided hydronephrosis. 2. Additional bilateral renal calculi. 3. Stable small right-sided omental nodules. 4. 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 Given size 4 cm, recommend biochemical evaluation and surgical consultation prior to considering resection, or further characterization with adrenal washout CT or chemical-shift MR if not already definitively characterized. Electronically signed by: Greig Pique MD 06/04/2024 12:30 AM EST RP Workstation: [id number]    Medications: I have reviewed the patient's current  medications.  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  Resection margins negative. Foundation 1-microsatellite status cannot be determined, tumor mutation burden cannot be determined, [id number], KMT2D(MLL2) MRI abdomen and pelvis 10/26/2021-new large bilateral adnexal masses concerning for bilateral ovarian neoplasm, stable 4 cm left adrenal gland lesion consistent with a benign adenoma, no evidence of omental disease, no adenopathy PET 01/17/2022-no evidence of residual or recurrent appendiceal carcinoma, small cutaneous nodule at the left posterior thorax with mild hypermetabolism, left thyroid  goiter, 4 cm benign left adrenal adenoma CTs 08/06/2022-no evidence of progressive disease, fluid adjacent to the gallbladder fundus CTs 07/03/2023: No evidence of local recurrence or metastatic disease, stable small rim of fluid at the gallbladder fundus CT abdomen/pelvis 11/27/2023-stable bilateral nephrolithiasis without obstruction, minimal stranding at the mesentery anterior to the proximal transverse colon (minimal nodular stranding was noted anterior to the ascending colon on 07/03/2023) CT chest 01/09/2024-no evidence of metastatic disease, stable left adrenal gland mass, no change in left thyroid  multinodular goiter CT abdomen/pelvis 02/06/2024: 7 mm and 6.5 mm nodules in the right omentum Multi bilateral renal calculi PET 02/19/2024: 2 omental nodules without abnormal hypermetabolism, left adrenal adenoma, numerous bilateral renal stones 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   Nephrolithiasis 4.   Admission 12/09/2020 with urosepsis secondary to obstructing right renal stone,  status post percutaneous nephrostomy placement Cystoscopy with laser lithotripsy and right JJ stent placement 02/08/2021 5.   Admission 06/03/2024 with right lower abdominal pain 06/03/2024 CT abdomen/pelvis: 8 mm proximal right ureter calculus with mild right hydronephrosis, additional bilateral renal calculi, stable small right sided omental nodules, unchanged left adrenal mass     Melanie Madden was admitted yesterday with severe right abdominal pain.  A CT last night reveals a right ureter stone with mild right hydronephrosis.  It is possible the ureter stone explains the pain, though this is an atypical presentation for kidney stone pain.  There is no clinical or radiologic evidence of progressive appendiceal carcinoma.  I discussed the CT findings with Melanie Madden and her husband.  Recommendations: Narcotic analgesics as needed for pain Urinalysis Urology consult I will be out until 06/08/2024, I will check on her then.  Please call oncology as needed.  Outpatient follow-up is scheduled at [facility]   LOS: 1 day   Arley Hof, MD   06/04/2024, 8:03 AM

## 2024-06-04 NOTE — Plan of Care (Signed)
   Problem: Education: Goal: Knowledge of General Education information will improve Description: Including pain rating scale, medication(s)/side effects and non-pharmacologic comfort measures Outcome: Progressing   Problem: Health Behavior/Discharge Planning: Goal: Ability to manage health-related needs will improve Outcome: Progressing   Problem: Clinical Measurements: Goal: Ability to maintain clinical measurements within normal limits will improve Outcome: Progressing Goal: Will remain free from infection Outcome: Progressing Goal: Diagnostic test results will improve Outcome: Progressing Goal: Respiratory complications will improve Outcome: Progressing Goal: Cardiovascular complication will be avoided Outcome: Progressing   Problem: Activity: Goal: Risk for activity intolerance will decrease Outcome: Progressing   Problem: Nutrition: Goal: Adequate nutrition will be maintained Outcome: Progressing   Problem: Coping: Goal: Level of anxiety will decrease Outcome: Progressing   Problem: Elimination: Goal: Will not experience complications related to bowel motility Outcome: Progressing Goal: Will not experience complications related to urinary retention Outcome: Progressing   Problem: Pain Managment: Goal: General experience of comfort will improve and/or be controlled Outcome: Progressing   Problem: Skin Integrity: Goal: Risk for impaired skin integrity will decrease Outcome: Progressing

## 2024-06-04 NOTE — Anesthesia Procedure Notes (Signed)
 Procedure Name: LMA Insertion Date/Time: 06/04/2024 2:15 PM  Performed by: Carleton Garnette SAUNDERS, CRNAPre-anesthesia Checklist: Patient identified, Emergency Drugs available, Suction available, Patient being monitored and Timeout performed Patient Re-evaluated:Patient Re-evaluated prior to induction Oxygen Delivery Method: Circle system utilized Preoxygenation: Pre-oxygenation with 100% oxygen Induction Type: IV induction LMA: LMA inserted LMA Size: 4.0 Tube type: Oral Number of attempts: 1 Placement Confirmation: positive ETCO2 and breath sounds checked- equal and bilateral Tube secured with: Tape Dental Injury: Teeth and Oropharynx as per pre-operative assessment

## 2024-06-04 NOTE — Op Note (Signed)
 Operative Note  Preoperative diagnosis:  1.  Right ureteral stone  Postoperative diagnosis: 1.  Right ureteral stone with pain  Procedure(s): 1.  Cystoscopy 2. Right retrograde pyelogram with interpretation 3. Right ureteral stent placement 4. Fluoroscopy <1 hour with intraoperative interpretation  Surgeon: Donnice Siad, MD  Assistants:  None  Anesthesia:  General  Complications:  None  EBL:  Minimal  Specimens: 1. None  Drains/Catheters: 1.  Right 6Fr x 26cm ureteral stent  Intraoperative findings:   Cystoscopy demonstrated no suspicious lesions, masses, stones or other pathology. Right retrograde pyelogram demonstrated mild right hydronephrosis. Successful right ureteral stent placement with curl in the renal pelvis and bladder respectively.  Indication:  Melanie Madden is a 58 y.o. female with an obstructing right proximal ureteral stone. After reviewing the management options for treatment, she elected to proceed with the above surgical procedure(s). We have discussed the potential benefits and risks of the procedure, side effects of the proposed treatment, the likelihood of the patient achieving the goals of the procedure, and any potential problems that might occur during the procedure or recuperation. Informed consent has been obtained.  Description of procedure: The patient was taken to the operating room and general anesthesia was induced.  The patient was placed in the dorsal lithotomy position, prepped and draped in the usual sterile fashion, and preoperative antibiotics were administered. A preoperative time-out was performed.   Cystourethroscopy was performed.  The patient's urethra was examined and was normal. The bladder was then systematically examined in its entirety. There was no evidence for any bladder tumors, stones, or other mucosal pathology.    Attention then turned to the right ureteral orifice. A 0.038 zip wire was passed through the  right orifice and over the wire a 5 Fr open ended catheter was inserted and passed up to the level of the renal pelvis. Omnipaque  contrast was injected through the ureteral catheter and a retrograde pyelogram was performed with findings as dictated above. The wire was then replaced and the open ended catheter was removed.   A 6Fr x 24cm ureteral stent was advance over the wire. The stent was positioned appropriately under fluoroscopic and cystoscopic guidance.  The wire was then removed with an adequate stent curl noted in the renal pelvis as well as in the bladder.  The bladder was then emptied and the procedure ended.  The patient appeared to tolerate the procedure well and without complications.  The patient was able to be awakened and transferred to the recovery unit in satisfactory condition.   Plan:  She will followup as scheduled for right PCNL.  Matt R. Matteus Mcnelly MD [facility]  Pager: [phone]

## 2024-06-04 NOTE — Anesthesia Postprocedure Evaluation (Signed)
 Anesthesia Post Note  Patient: Melanie Madden  Procedure(s) Performed: CYSTOSCOPY, WITH RETROGRADE PYELOGRAM AND URETERAL STENT INSERTION (Right)     Patient location during evaluation: PACU Anesthesia Type: General Level of consciousness: awake and alert Pain management: pain level controlled Vital Signs Assessment: post-procedure vital signs reviewed and stable Respiratory status: spontaneous breathing, nonlabored ventilation, respiratory function stable and patient connected to nasal cannula oxygen Cardiovascular status: blood pressure returned to baseline and stable Postop Assessment: no apparent nausea or vomiting Anesthetic complications: no   No notable events documented.  Last Vitals:  Vitals:   06/04/24 1538 06/04/24 1545  BP: (!) 166/99 (!) 166/98  Pulse: 66 70  Resp: 13 20  Temp:    SpO2: 98% 97%    Last Pain:  Vitals:   06/04/24 1545  TempSrc:   PainSc: Asleep                 Cordella P Zayne Marovich

## 2024-06-04 NOTE — Plan of Care (Signed)
  Problem: Education: Goal: Knowledge of General Education information will improve Description: Including pain rating scale, medication(s)/side effects and non-pharmacologic comfort measures Outcome: Progressing   Problem: Clinical Measurements: Goal: Ability to maintain clinical measurements within normal limits will improve Outcome: Progressing Goal: Will remain free from infection Outcome: Progressing Goal: Diagnostic test results will improve Outcome: Progressing Goal: Respiratory complications will improve Outcome: Progressing Goal: Cardiovascular complication will be avoided Outcome: Progressing   Problem: Nutrition: Goal: Adequate nutrition will be maintained Outcome: Progressing   Problem: Coping: Goal: Level of anxiety will decrease Outcome: Progressing   Problem: Pain Managment: Goal: General experience of comfort will improve and/or be controlled Outcome: Progressing   Problem: Safety: Goal: Ability to remain free from injury will improve Outcome: Progressing   Problem: Skin Integrity: Goal: Risk for impaired skin integrity will decrease Outcome: Progressing

## 2024-06-04 NOTE — Assessment & Plan Note (Addendum)
 Followed by [facility] Has an appointment to see Dr. Cam in January S/p cystoscopy with stent today Will need outpatient f/u with urology

## 2024-06-04 NOTE — Consult Note (Addendum)
 Urology Consult Note   Requesting Attending Physician:  Barbarann Nest, MD Service Providing Consult: Urology  Consulting Attending: Dr. Selma   Reason for Consult:    HPI: Melanie Madden is seen in consultation for reasons noted above at the request of Barbarann Nest, MD. Patient is a 58 y.o. female presenting from her cancer center with complaints of RLQ.  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  She is known to our practice and has seen Dr. Devere in the past for nephrolithiasis.  She presently is awaiting percutaneous nephrolithotomy in January with Dr. Cam.  [facility] was contacted to review her imaging and consider surgical intervention for 8 mm proximal right obstructing ureteral stone.  On arrival to the room patient was accompanied by one of her family oncologist.  She is afebrile and all of her labs are unremarkable.  Urinalysis was unavailable.  Case and plan was reviewed.  Patient continued to enthusiastically gesture to her right lower quadrant.  Case and plan was reviewed and caretaker was in agreement to proceed with ureteral stent placement if necessary.  All questions were answered to their satisfaction. ------------------  Assessment:   58 y.o. female with expressive aphasia post CVA with bilateral renal stones with new obstructing 8mm proximal right stone. Localizing RLQ pain   Recommendations: #right ureteral stone with associated hydronephrosis  To the OR with Dr. Selma for right ureteral stent placement. Remain n.p.o. Will discuss with Dr. Cam as to whether or not definitive management of right ureteral stone can be addressed during her January PCNL or needs to be a separate procedure. Urology will follow  Case and plan discussed with Dr. Selma  Past  Medical History: Past Medical History:  Diagnosis Date   Allergy    Anxiety    Cancer of appendix [facility]) Stage 4    Depression    Expressive aphasia    Hemiparesis (HCC)    Right side   High triglycerides    History of kidney stones    Hypertension    Kidney stones    Recurrent sinus infections    Septic shock (HCC) 03/26/2023   Septic shock (HCC) 02/08/2021   Stroke (HCC) 07/09/2019    Past Surgical History:  Past Surgical History:  Procedure Laterality Date   ANTERIOR CRUCIATE LIGAMENT REPAIR Left    BUNIONECTOMY Right    CYSTOSCOPY/URETEROSCOPY/HOLMIUM LASER/STENT PLACEMENT Right 02/08/2021   Procedure: CYSTOSCOPY/RETROGRADE/URETEROSCOPY/HOLMIUM LASER/STENT PLACEMENT, NEPHROSTOMY TUBE REMOVAL;  Surgeon: Devere Lonni Righter, MD;  Location: [facility];  Service: Urology;  Laterality: Right;   CYSTOSCOPY/URETEROSCOPY/HOLMIUM LASER/STENT PLACEMENT Bilateral 04/10/2023   Procedure: CYSTOSCOPY, BILATERAL URETEROSCOPY, BILATERAL RETROGRADE PYELOGRAM, HOLMIUM LASER LITHOTRIPSY, AND BILATERAL URETERAL STENT PLACEMENT, removal of percutanous stent post op;  Surgeon: Devere Lonni Righter, MD;  Location: [facility];  Service: Urology;  Laterality: Bilateral;  90 MINUTES   IR NEPHROSTOMY PLACEMENT RIGHT  12/09/2020   IR NEPHROSTOMY PLACEMENT RIGHT  03/27/2023   LASIK  2008   LIPOSUCTION     LITHOTRIPSY     MENISCUS REPAIR Left    NASAL SEPTUM SURGERY     ROBOTIC ASSISTED TOTAL HYSTERECTOMY WITH BILATERAL SALPINGO OOPHERECTOMY Bilateral 12/06/2021   Procedure: XI ROBOTIC ASSISTED BILATERAL SALPINGO OOPHORECTOMY, TOTAL HYSTERECTOMY, PERITONEAL BIOPSY, OMENTECTOMY, LAPAROTOMY, APPENDECTOMY, PAP SMEAR;  Surgeon: Viktoria Comer SAUNDERS, MD;  Location: [facility];  Service: Gynecology;  Laterality: Bilateral;   SHOULDER SURGERY Right    VARICOSE VEIN SURGERY Left     Medication: Current Facility-Administered Medications  Medication Dose Route Frequency Provider Last Rate  Last Admin   acetaminophen  (TYLENOL ) tablet 650 mg  650 mg Oral Q6H PRN Barbarann Nest, MD       Or   acetaminophen  (TYLENOL ) suppository 650 mg  650 mg Rectal Q6H PRN Barbarann Nest, MD       bisacodyl  (DULCOLAX) EC tablet 5 mg  5 mg Oral Daily PRN Barbarann Nest, MD       docusate sodium  (COLACE) capsule 100 mg  100 mg Oral BID Barbarann Nest, MD   100 mg at 06/04/24 9070   enoxaparin  (LOVENOX ) injection 40 mg  40 mg Subcutaneous Q24H Barbarann Nest, MD   40 mg at 06/03/24 2201   hydrALAZINE  (APRESOLINE ) injection 5 mg  5 mg Intravenous Q4H PRN Barbarann Nest, MD       morphine  (PF) 2 MG/ML injection 2 mg  2 mg Intravenous Q2H PRN Barbarann Nest, MD       ondansetron  (ZOFRAN ) tablet 4 mg  4 mg Oral Q6H PRN Barbarann Nest, MD       Or   ondansetron  (ZOFRAN ) injection 4 mg  4 mg Intravenous Q6H PRN Barbarann Nest, MD       oxyCODONE  (Oxy IR/ROXICODONE ) immediate release tablet 5 mg  5 mg Oral Q4H PRN Barbarann Nest, MD   5 mg at 06/04/24 9070   polyethylene glycol (MIRALAX  / GLYCOLAX ) packet 17 g  17 g Oral Daily PRN Barbarann Nest, MD        Allergies: Allergies  Allergen Reactions   Amoxicillin Nausea Only   Shellfish Allergy Diarrhea, Nausea And Vomiting and Other (See Comments)    Mussels extreme n/v    Social History: Social History   Tobacco Use   Smoking status: Former   Smokeless tobacco: Former  Building Services Engineer status: Never Used  Substance Use Topics   Alcohol use: Yes    Comment: occ wine   Drug use: No    Family History Family History  Problem Relation Age of Onset   Alzheimer's disease Mother    Hypertension Mother    Diabetes Mother    Thyroid  disease Mother    Deep vein thrombosis Maternal Grandmother    Colon cancer Neg Hx    Colon polyps Neg Hx    Esophageal cancer Neg Hx    Pancreatic cancer Neg Hx    Rectal cancer Neg Hx    Stomach cancer Neg Hx    Breast cancer Neg Hx     Review of Systems  Unable to perform ROS: Patient  nonverbal     Objective   Vital signs in last 24 hours: BP (!) 159/87 (BP Location: Left Arm)   Pulse 72   Temp (!) 97.5 F (36.4 C) (Oral)   Resp 18   Ht 5' 7 (1.702 m)   SpO2 99%   BMI 23.02 kg/m   Physical Exam General: Nonverbal, gestures appropriately HEENT: [place]/AT Pulmonary: Normal work of breathing Cardiovascular: no cyanosis Abdomen: Soft, NTTP, nondistended    Most Recent Labs: Lab Results  Component Value Date   WBC 5.2 06/04/2024   HGB 14.6 06/04/2024   HCT 46.3 (H) 06/04/2024   PLT 168 06/04/2024    Lab Results  Component Value Date   NA 138 06/04/2024   K 4.0 06/04/2024   CL 104 06/04/2024   CO2 23 06/04/2024  BUN 20 06/04/2024   CREATININE 0.98 06/04/2024   CALCIUM  10.3 06/04/2024   MG 2.3 03/28/2023   PHOS 3.2 03/27/2023    Lab Results  Component Value Date   INR 1.2 03/26/2023   APTT 30 03/26/2023     Urine Culture: @[id number] (laburin,org,[id number],[id number])@   IMAGING: CT ABDOMEN PELVIS W CONTRAST Result Date: 06/04/2024 EXAM: CT ABDOMEN AND PELVIS WITH CONTRAST 06/04/2024 12:22:37 AM TECHNIQUE: CT of the abdomen and pelvis was performed with the administration of 100 mL of iohexol  (OMNIPAQUE ) 300 MG/ML solution. Multiplanar reformatted images are provided for review. Automated exposure control, iterative reconstruction, and/or weight-based adjustment of the mA/kV was utilized to reduce the radiation dose to as low as reasonably achievable. COMPARISON: CT abdomen and pelvis 02/06/2024 CLINICAL HISTORY: Abdominal pain, acute, nonlocalized. FINDINGS: LOWER CHEST: No acute abnormality. LIVER: The liver is unremarkable. GALLBLADDER AND BILE DUCTS: Gallbladder is unremarkable. No biliary ductal dilatation. SPLEEN: Calcified granuloma in the spleen. PANCREAS: No acute abnormality. 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 This was previously characterized as adenoma. The right adrenal gland is within  normal limits. KIDNEYS, URETERS AND BLADDER: There is an 8 mm calculus in the proximal right ureter. There is mild right-sided hydronephrosis. Additional bilateral renal calculi are present with the largest in the superior pole of the right kidney measuring 15 mm. There is no left-sided hydronephrosis. There is no perinephric fat stranding. Bilateral renal cortical scarring again seen. Urinary bladder is unremarkable. GI AND BOWEL: Stomach demonstrates no acute abnormality. The appendix is not visualized. There is no bowel obstruction. PERITONEUM AND RETROPERITONEUM: There are few right-sided omental nodules measuring up to 6 mm which appear unchanged from prior. No ascites. No free air. VASCULATURE: Aorta is normal in caliber. LYMPH NODES: No lymphadenopathy. REPRODUCTIVE ORGANS: Uterus is surgically absent. BONES AND SOFT TISSUES: Moderate chronic compression deformity of L3 is unchanged. Multilevel degenerative changes affect the spine. No focal soft tissue abnormality. IMPRESSION: 1. 8 mm calculus in the proximal right ureter with mild right-sided hydronephrosis. 2. Additional bilateral renal calculi. 3. Stable small right-sided omental nodules. 4. 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 Given size 4 cm, recommend biochemical evaluation and surgical consultation prior to considering resection, or further characterization with adrenal washout CT or chemical-shift MR if not already definitively characterized. Electronically signed by: Greig Pique MD 06/04/2024 12:30 AM EST RP Workstation: [id number]    ------  Ole Bourdon, NP Pager: [phone]   Please contact the urology consult pager with any further questions/concerns.  I have seen and examined the patient.  Patient is resting comfortably.  Abdominal soft, nondistended, no CVA tenderness.  Briefly, she is well-known to our service and recently saw Dr. Cam in 04/2024  for consultation of her partial right staghorn stone.  PCNL is being arranged.  She presented with right-sided flank pain and CT A/P 06/04/2024 revealed an 8 mm proximal right ureteral stone.  She has had significant pain refractory to pain management.  Options were discussed and she elected proceed with right ureteral stent placement.  Added on today for stent placement.  Also reviewed imaging with incidental left adrenal mass now measuring 4 cm, unchanged.  This has been previously characterized as adenoma.  Recommend biochemical evaluation and adrenal washout CT outpatient.  Discussed case with patient and her caregiver.  Will notify Dr. Cam of plan ureteral stent placement today.  Matt R. Shaliah Wann MD [facility]  Pager: [phone]

## 2024-06-04 NOTE — Anesthesia Preprocedure Evaluation (Signed)
 Anesthesia Evaluation  Patient identified by MRN, date of birth, ID band Patient awake    Reviewed: Allergy & Precautions, NPO status , Patient's Chart, lab work & pertinent test results  Airway Mallampati: II  TM Distance: >3 FB Neck ROM: Full    Dental no notable dental hx.    Pulmonary neg pulmonary ROS, former smoker   Pulmonary exam normal        Cardiovascular hypertension, Pt. on medications  Rhythm:Regular Rate:Normal     Neuro/Psych  Headaches  Anxiety Depression    CVA    GI/Hepatic negative GI ROS, Neg liver ROS,,,  Endo/Other  negative endocrine ROS    Renal/GU Renal disease  negative genitourinary   Musculoskeletal negative musculoskeletal ROS (+)    Abdominal Normal abdominal exam  (+)   Peds  Hematology  (+) Blood dyscrasia, anemia   Anesthesia Other Findings   Reproductive/Obstetrics                              Anesthesia Physical Anesthesia Plan  ASA: 3  Anesthesia Plan: General   Post-op Pain Management:    Induction: Intravenous  PONV Risk Score and Plan: 3 and Ondansetron , Dexamethasone , Midazolam  and Treatment may vary due to age or medical condition  Airway Management Planned: Mask and LMA  Additional Equipment: None  Intra-op Plan:   Post-operative Plan: Extubation in OR  Informed Consent: I have reviewed the patients History and Physical, chart, labs and discussed the procedure including the risks, benefits and alternatives for the proposed anesthesia with the patient or authorized representative who has indicated his/her understanding and acceptance.     Dental advisory given  Plan Discussed with: CRNA  Anesthesia Plan Comments:         Anesthesia Quick Evaluation

## 2024-06-05 ENCOUNTER — Encounter (HOSPITAL_COMMUNITY): Payer: Self-pay | Admitting: Urology

## 2024-06-05 ENCOUNTER — Telehealth: Payer: Self-pay | Admitting: Oncology

## 2024-06-08 ENCOUNTER — Emergency Department (HOSPITAL_COMMUNITY): Admission: EM | Admit: 2024-06-08 | Discharge: 2024-06-08 | Disposition: A | Discharge status: Home / Self Care

## 2024-06-08 ENCOUNTER — Emergency Department (HOSPITAL_COMMUNITY)

## 2024-06-08 DIAGNOSIS — R1031 Right lower quadrant pain: Secondary | ICD-10-CM | POA: Diagnosis present

## 2024-06-08 DIAGNOSIS — K529 Noninfective gastroenteritis and colitis, unspecified: Secondary | ICD-10-CM | POA: Insufficient documentation

## 2024-06-08 LAB — URINALYSIS, ROUTINE W REFLEX MICROSCOPIC
Bacteria, UA: NONE SEEN
Bilirubin Urine: NEGATIVE
Glucose, UA: NEGATIVE mg/dL
Ketones, ur: NEGATIVE mg/dL
Nitrite: NEGATIVE
Protein, ur: 30 mg/dL — AB
RBC / HPF: >50 RBC/hpf (ref 0–5)
Specific Gravity, Urine: 1.009 (ref 1.005–1.030)
pH: 8 (ref 5.0–8.0)

## 2024-06-08 LAB — LIPASE, BLOOD: Lipase: 19 U/L (ref 11–51)

## 2024-06-08 LAB — COMPREHENSIVE METABOLIC PANEL WITH GFR
ALT: 19 U/L (ref 0–44)
AST: 17 U/L (ref 15–41)
Albumin: 4.1 g/dL (ref 3.5–5.0)
Alkaline Phosphatase: 104 U/L (ref 38–126)
Anion gap: 12 (ref 5–15)
BUN: 17 mg/dL (ref 6–20)
CO2: 25 mmol/L (ref 22–32)
Calcium: 10.4 mg/dL — ABNORMAL HIGH (ref 8.9–10.3)
Chloride: 103 mmol/L (ref 98–111)
Creatinine, Ser: 0.9 mg/dL (ref 0.44–1.00)
GFR, Estimated: >60 mL/min (ref >60)
Glucose, Bld: 121 mg/dL — ABNORMAL HIGH (ref 70–99)
Potassium: 3.7 mmol/L (ref 3.5–5.1)
Sodium: 140 mmol/L (ref 135–145)
Total Bilirubin: 0.5 mg/dL (ref 0.0–1.2)
Total Protein: 7.2 g/dL (ref 6.5–8.1)

## 2024-06-08 LAB — CBC WITH DIFFERENTIAL/PLATELET
Abs Immature Granulocytes: 0.02 K/uL (ref 0.00–0.07)
Basophils Absolute: 0 K/uL (ref 0.0–0.1)
Basophils Relative: 0 %
Eosinophils Absolute: 0.1 K/uL (ref 0.0–0.5)
Eosinophils Relative: 2 %
HCT: 49.8 % — ABNORMAL HIGH (ref 36.0–46.0)
Hemoglobin: 16.9 g/dL — ABNORMAL HIGH (ref 12.0–15.0)
Immature Granulocytes: 0 %
Lymphocytes Relative: 21 %
Lymphs Abs: 1.1 K/uL (ref 0.7–4.0)
MCH: 30.8 pg (ref 26.0–34.0)
MCHC: 33.9 g/dL (ref 30.0–36.0)
MCV: 90.7 fL (ref 80.0–100.0)
Monocytes Absolute: 0.4 K/uL (ref 0.1–1.0)
Monocytes Relative: 7 %
Neutro Abs: 3.6 K/uL (ref 1.7–7.7)
Neutrophils Relative %: 70 %
Platelets: 158 K/uL (ref 150–400)
RBC: 5.49 MIL/uL — ABNORMAL HIGH (ref 3.87–5.11)
RDW: 13.7 % (ref 11.5–15.5)
WBC: 5.2 K/uL (ref 4.0–10.5)
nRBC: 0 % (ref 0.0–0.2)

## 2024-06-08 MED ORDER — HYDROMORPHONE HCL 1 MG/ML IJ SOLN
0.5000 mg | Freq: Once | INTRAMUSCULAR | Status: AC
  Administered 2024-06-08: 0.5 mg via INTRAVENOUS
  Filled 2024-06-08: qty 1

## 2024-06-08 MED ORDER — METRONIDAZOLE 500 MG/100ML IV SOLN
500.0000 mg | Freq: Once | INTRAVENOUS | Status: AC
  Administered 2024-06-08: 500 mg via INTRAVENOUS
  Filled 2024-06-08: qty 100

## 2024-06-08 MED ORDER — CIPROFLOXACIN HCL 500 MG PO TABS: 500.0000 mg | ORAL_TABLET | Freq: Two times a day (BID) | ORAL | 0 refills | Status: DC

## 2024-06-08 MED ORDER — OXYCODONE-ACETAMINOPHEN 7.5-325 MG PO TABS: 1.0000 | ORAL_TABLET | Freq: Four times a day (QID) | ORAL | 0 refills | Status: DC | PRN

## 2024-06-08 MED ORDER — ONDANSETRON HCL 4 MG/2ML IJ SOLN
4.0000 mg | Freq: Once | INTRAMUSCULAR | Status: AC
  Administered 2024-06-08: 4 mg via INTRAVENOUS
  Filled 2024-06-08 (×2): qty 2

## 2024-06-08 MED ORDER — CIPROFLOXACIN HCL 500 MG PO TABS
500.0000 mg | ORAL_TABLET | Freq: Once | ORAL | Status: AC
  Administered 2024-06-08: 500 mg via ORAL
  Filled 2024-06-08: qty 1

## 2024-06-08 MED ORDER — METRONIDAZOLE 500 MG PO TABS: 500.0000 mg | ORAL_TABLET | Freq: Two times a day (BID) | ORAL | 0 refills | Status: DC

## 2024-06-08 MED ORDER — HYDROMORPHONE HCL 1 MG/ML IJ SOLN
0.5000 mg | Freq: Once | INTRAMUSCULAR | Status: AC
  Administered 2024-06-08: 0.5 mg via INTRAVENOUS
  Filled 2024-06-08 (×2): qty 1

## 2024-06-08 MED ORDER — IOHEXOL 300 MG/ML  SOLN
100.0000 mL | Freq: Once | INTRAMUSCULAR | Status: AC | PRN
  Administered 2024-06-08: 100 mL via INTRAVENOUS

## 2024-06-08 NOTE — Discharge Instructions (Signed)
 He stent looks like it is in the right place.  I did call and discussed this with your urologist.  They do want you to follow-up.  Please call their office tomorrow for scheduling a follow-up appointment.  Use the higher dose of pain medication in the meantime.  The CT scan did show colitis.  Please take the antibiotics as prescribed and follow-up with the GI doctor at the number provided.

## 2024-06-08 NOTE — ED Triage Notes (Signed)
 Pt BIB EMS from home c/o right abd ain radiating to back. No rebound tenderness or rigidity. No N/V/D. Pain w urination. Hx of kidney stones, operation of kindey uretal stent on weds. Pain meds given today, did not help. Stroke hx, right sided deficit. Verbal deficit, but understands- pt caregiver and husband at bedside.    EMS vitals  BP 142/92 HR 82 SPO2 97 RA CBG 139 Temp 99.2

## 2024-06-08 NOTE — ED Provider Notes (Signed)
 Received patient in turnover from Dr. Ula.  Please see their note for further details of Hx, PE.  Briefly patient is a 58 y.o. female with a No chief complaint on file. .  Patient with recent ureteral stent placement.  Here with abdominal discomfort.  Case discussed with urology recommended close follow-up.  Started on antibiotic therapy for colitis.  Awaiting UA.  UA is resulted without obvious infection. D/c per plan.    Emil Share, DO 06/08/24 1615

## 2024-06-08 NOTE — ED Provider Notes (Signed)
 [place] EMERGENCY DEPARTMENT AT [facility] Provider Note   CSN: [id number] Arrival date & time: 06/08/24  9048     Patient presents with: No chief complaint on file.   Melanie Madden is a 58 y.o. female.   58 year old female with past medical history of stage IV appendiceal cancer as well as recurrent ureterolithiasis with recent stent placement presenting to the emergency department today with abdominal pain.  The patient is complaining of right lower quadrant abdominal pain starting this morning.  The patient is nonverbal from a CVA in the past but according to her caregiver she was complaining of pain earlier and is pointing to her right lower abdomen.  The patient has been on oxycodone  since her procedure.  She has been afebrile and has been having normal bowel movements over the past few days per her caregivers.  She was brought to the emergency department today for further evaluation regarding this due to ongoing symptoms.        Prior to Admission medications   Medication Sig Start Date End Date Taking? Authorizing Provider  ciprofloxacin  (CIPRO ) 500 MG tablet Take 1 tablet (500 mg total) by mouth every 12 (twelve) hours. 06/08/24  Yes Ula Prentice SAUNDERS, MD  metroNIDAZOLE  (FLAGYL ) 500 MG tablet Take 1 tablet (500 mg total) by mouth 2 (two) times daily. 06/08/24  Yes Ula Prentice SAUNDERS, MD  oxyCODONE -acetaminophen  (PERCOCET) 7.5-325 MG tablet Take 1 tablet by mouth every 6 (six) hours as needed for severe pain (pain score 7-10). 06/08/24  Yes Ula Prentice SAUNDERS, MD  atorvastatin  (LIPITOR) 20 MG tablet Take 1 tablet (20 mg total) by mouth daily at 6 PM. Patient taking differently: Take 20 mg by mouth in the morning. 08/20/19   Angiulli, Toribio PARAS, PA-C  losartan (COZAAR) 100 MG tablet Take 100 mg by mouth daily. 03/06/24   [provider]  naproxen  sodium (ALEVE ) 220 MG tablet Take 440 mg by mouth daily with breakfast.    [provider]  polyethylene  glycol powder (GLYCOLAX /MIRALAX ) 17 GM/SCOOP powder Take 8.5 g by mouth See admin instructions. Dissolve 8.5 grams in 4-8 ounces of liquid and drink by mouth in the morning    [provider]    Allergies: Amoxicillin and Shellfish allergy    Review of Systems  Reason unable to perform ROS: Expressive aphasia.    Updated Vital Signs BP (!) 165/100   Pulse 77   Temp 98.9 F (37.2 C)   Resp 18   Ht 5' 7 (1.702 m)   Wt 88.6 kg   SpO2 95%   BMI 30.59 kg/m   Physical Exam Vitals and nursing note reviewed.   Gen: NAD Eyes: PERRL, EOMI HEENT: no oropharyngeal swelling Neck: trachea midline Resp: clear to auscultation bilaterally Card: RRR, no murmurs, rubs, or gallops Abd: Will grimace when palpating the right periumbilical region right lower quadrant with no guarding or rebound Extremities: no calf tenderness, no edema Vascular: 2+ radial pulses bilaterally, 2+ DP pulses bilaterally Skin: no rashes Psyc: acting appropriately   (all labs ordered are listed, but only abnormal results are displayed) Labs Reviewed  CBC WITH DIFFERENTIAL/PLATELET - Abnormal; Notable for the following components:      Result Value   RBC 5.49 (*)    Hemoglobin 16.9 (*)    HCT 49.8 (*)    All other components within normal limits  COMPREHENSIVE METABOLIC PANEL WITH GFR - Abnormal; Notable for the following components:   Glucose, Bld 121 (*)  Calcium  10.4 (*)    All other components within normal limits  LIPASE, BLOOD  URINALYSIS, ROUTINE W REFLEX MICROSCOPIC  URINALYSIS, W/ REFLEX TO CULTURE (INFECTION SUSPECTED)    EKG: None  Radiology: CT ABDOMEN PELVIS W CONTRAST Result Date: 06/08/2024 CLINICAL DATA:  Abdominal pain. EXAM: CT ABDOMEN AND PELVIS WITH CONTRAST TECHNIQUE: Multidetector CT imaging of the abdomen and pelvis was performed using the standard protocol following bolus administration of intravenous contrast. RADIATION DOSE REDUCTION: This exam was performed  according to the departmental dose-optimization program which includes automated exposure control, adjustment of the mA and/or kV according to patient size and/or use of iterative reconstruction technique. CONTRAST:  OMNIPAQUE  IOHEXOL  300 MG/ML  SOLN COMPARISON:  CT abdomen pelvis dated 06/04/2024 head FINDINGS: Lower chest: The visualized lung bases are clear. No intra-abdominal free air or free fluid. Hepatobiliary: The liver is unremarkable. No biliary dilatation. The gallbladder is unremarkable Pancreas: Unremarkable. No pancreatic ductal dilatation or surrounding inflammatory changes. Spleen: Normal in size without focal abnormality. Adrenals/Urinary Tract: The right adrenal glands unremarkable. A 4 cm left adrenal mass as seen previously. Follow-up as per recommendation of prior CT. Bilateral cortical scarring and irregularity. Multiple bilateral nonobstructing renal calculi as seen on the prior CT. There has been interval placement of a right ureteral stent with proximal end tip in the right renal pelvis and distal end in the urinary bladder. Interval resolution of the previously seen right-sided hydronephrosis. The 8 mm stone seen in the proximal right ureter on the prior CT appears to have been pushed up and now located in the collecting system of the right lower pole. No stone along the course of the ureteral stent. The left ureter and urinary bladder appear normal. Stomach/Bowel: There is mild sigmoid diverticulosis. There is somewhat diffuse thickened appearance of the sigmoid colon. Clinical correlation recommended to evaluate for possibility of colitis. Moderate amount of stool mixed with contrast noted throughout the colon. There is no bowel obstruction. The appendix is not identified, likely surgically absent. Vascular/Lymphatic: The abdominal aorta and IVC unremarkable. No portal venous gas. There is no adenopathy. Reproductive: Hysterectomy.  No suspicious adnexal masses. Other: None  Musculoskeletal: Osteopenia with degenerative changes of the spine and scoliosis. No acute osseous pathology. IMPRESSION: 1. Interval placement of a right ureteral stent with resolution of the previously seen right-sided hydronephrosis. 2. The 8 mm stone seen in the proximal right ureter on the prior CT has been pushed up and now located in the right renal inferior pole collecting system. 3. Multiple bilateral nonobstructing renal calculi. 4. Mild sigmoid diverticulosis. Somewhat diffuse thickened appearance of the sigmoid colon. Clinical correlation recommended to evaluate for possibility of colitis. No bowel obstruction. Electronically Signed   By: Vanetta Chou M.D.   On: 06/08/2024 14:26     Procedures   Medications Ordered in the ED  ciprofloxacin  (CIPRO ) tablet 500 mg (has no administration in time range)  metroNIDAZOLE  (FLAGYL ) IVPB 500 mg (has no administration in time range)  HYDROmorphone  (DILAUDID ) injection 0.5 mg (0.5 mg Intravenous Given 06/08/24 1105)  ondansetron  (ZOFRAN ) injection 4 mg (4 mg Intravenous Given 06/08/24 1104)  iohexol  (OMNIPAQUE ) 300 MG/ML solution 100 mL (100 mLs Intravenous Contrast Given 06/08/24 1333)  HYDROmorphone  (DILAUDID ) injection 0.5 mg (0.5 mg Intravenous Given 06/08/24 1437)                                    Medical Decision Making  58 year old female with past medical history of stage IV appendiceal cancer as well as recent ureteral stent placement presenting to the emergency department today with abdominal pain.  I will further evaluate the patient here with basic labs including LFTs and a lipase to evaluate for hepatobiliary otology or pancreatitis.  Will obtain a CT scan of her abdomen given her history of cancer to evaluate for obstruction as well as to evaluate for migration of her stent.  Pain may be due to pain from her stent.  Will also obtain a urinalysis here to evaluate for infection.  I will give the patient Dilaudid  and Zofran  for  symptoms and reevaluate for ultimate disposition.  The patient's workup was largely reassuring.  Her CT scan showed the stent with the stone back in the kidney.  There were some findings consistent with colitis.  Given the patient's inability to communicate will go ahead and treat with antibiotics.  The patient does not tolerate amoxicillin or Augmentin.  She started on ciprofloxacin  and Flagyl .  I did call and discussed her case with Ole New from urology.  Agrees with antibiotic treatment.  We will obtain a urinalysis here.  Plan is for discharge with urology and GI follow-up and I will go up on her pain medication dosing.  She will be discharged with return precautions.  Urinalysis is pending at the time of signout the plan is for discharge with follow-up on ciprofloxacin  and Flagyl .  Amount and/or Complexity of Data Reviewed Labs: ordered. Radiology: ordered.  Risk Prescription drug management.        Final diagnoses:  Colitis    ED Discharge Orders          Ordered    ciprofloxacin  (CIPRO ) 500 MG tablet  Every 12 hours        06/08/24 1501    metroNIDAZOLE  (FLAGYL ) 500 MG tablet  2 times daily        06/08/24 1501    oxyCODONE -acetaminophen  (PERCOCET) 7.5-325 MG tablet  Every 6 hours PRN        06/08/24 1501               Ula Prentice SAUNDERS, MD 06/08/24 1521

## 2024-06-09 LAB — URINE CULTURE

## 2024-06-12 ENCOUNTER — Observation Stay (HOSPITAL_COMMUNITY)
Admission: EM | Admit: 2024-06-12 | Discharge: 2024-06-15 | Disposition: A | Discharge status: Home / Self Care | Attending: Student | Admitting: Student

## 2024-06-12 ENCOUNTER — Emergency Department (HOSPITAL_COMMUNITY)

## 2024-06-12 DIAGNOSIS — M79604 Pain in right leg: Secondary | ICD-10-CM | POA: Diagnosis not present

## 2024-06-12 DIAGNOSIS — Z79899 Other long term (current) drug therapy: Secondary | ICD-10-CM | POA: Diagnosis not present

## 2024-06-12 DIAGNOSIS — N390 Urinary tract infection, site not specified: Secondary | ICD-10-CM | POA: Diagnosis not present

## 2024-06-12 DIAGNOSIS — E66811 Obesity, class 1: Secondary | ICD-10-CM | POA: Insufficient documentation

## 2024-06-12 DIAGNOSIS — Z8673 Personal history of transient ischemic attack (TIA), and cerebral infarction without residual deficits: Secondary | ICD-10-CM | POA: Diagnosis not present

## 2024-06-12 DIAGNOSIS — Z87891 Personal history of nicotine dependence: Secondary | ICD-10-CM | POA: Diagnosis not present

## 2024-06-12 DIAGNOSIS — I10 Essential (primary) hypertension: Secondary | ICD-10-CM | POA: Diagnosis present

## 2024-06-12 DIAGNOSIS — Z85038 Personal history of other malignant neoplasm of large intestine: Secondary | ICD-10-CM | POA: Diagnosis not present

## 2024-06-12 DIAGNOSIS — R10A0 Flank pain, unspecified side: Secondary | ICD-10-CM | POA: Diagnosis present

## 2024-06-12 DIAGNOSIS — E785 Hyperlipidemia, unspecified: Secondary | ICD-10-CM

## 2024-06-12 LAB — CBC
HCT: 43.1 % (ref 36.0–46.0)
Hemoglobin: 14.6 g/dL (ref 12.0–15.0)
MCH: 30.5 pg (ref 26.0–34.0)
MCHC: 33.9 g/dL (ref 30.0–36.0)
MCV: 90.2 fL (ref 80.0–100.0)
Platelets: 216 K/uL (ref 150–400)
RBC: 4.78 MIL/uL (ref 3.87–5.11)
RDW: 13.8 % (ref 11.5–15.5)
WBC: 7.3 K/uL (ref 4.0–10.5)
nRBC: 0 % (ref 0.0–0.2)

## 2024-06-12 LAB — BASIC METABOLIC PANEL WITH GFR
Anion gap: 12 (ref 5–15)
BUN: 19 mg/dL (ref 6–20)
CO2: 24 mmol/L (ref 22–32)
Calcium: 10.2 mg/dL (ref 8.9–10.3)
Chloride: 102 mmol/L (ref 98–111)
Creatinine, Ser: 1.01 mg/dL — ABNORMAL HIGH (ref 0.44–1.00)
GFR, Estimated: >60 mL/min (ref >60)
Glucose, Bld: 116 mg/dL — ABNORMAL HIGH (ref 70–99)
Potassium: 3.8 mmol/L (ref 3.5–5.1)
Sodium: 137 mmol/L (ref 135–145)

## 2024-06-12 LAB — URINALYSIS, W/ REFLEX TO CULTURE (INFECTION SUSPECTED)
Bacteria, UA: NONE SEEN
Bilirubin Urine: NEGATIVE
Glucose, UA: NEGATIVE mg/dL
Ketones, ur: NEGATIVE mg/dL
Nitrite: NEGATIVE
Protein, ur: 100 mg/dL — AB
RBC / HPF: >50 RBC/hpf (ref 0–5)
Specific Gravity, Urine: 1.018 (ref 1.005–1.030)
WBC, UA: >50 WBC/hpf (ref 0–5)
pH: 6 (ref 5.0–8.0)

## 2024-06-12 MED ORDER — MORPHINE SULFATE (PF) 4 MG/ML IV SOLN
4.0000 mg | Freq: Once | INTRAVENOUS | Status: AC
  Administered 2024-06-12: 4 mg via INTRAVENOUS
  Filled 2024-06-12: qty 1

## 2024-06-12 MED ORDER — ONDANSETRON HCL 4 MG/2ML IJ SOLN
4.0000 mg | Freq: Once | INTRAMUSCULAR | Status: AC
  Administered 2024-06-12: 4 mg via INTRAVENOUS
  Filled 2024-06-12: qty 2

## 2024-06-12 MED ORDER — SODIUM CHLORIDE 0.9 % IV SOLN
1.0000 g | Freq: Once | INTRAVENOUS | Status: AC
  Administered 2024-06-12: 1 g via INTRAVENOUS
  Filled 2024-06-12: qty 10

## 2024-06-12 MED ORDER — IOHEXOL 300 MG/ML  SOLN
100.0000 mL | Freq: Once | INTRAMUSCULAR | Status: AC | PRN
  Administered 2024-06-12: 100 mL via INTRAVENOUS

## 2024-06-12 MED ORDER — OXYCODONE-ACETAMINOPHEN 5-325 MG PO TABS
1.0000 | ORAL_TABLET | Freq: Once | ORAL | Status: AC
  Administered 2024-06-12: 1 via ORAL
  Filled 2024-06-12: qty 1

## 2024-06-12 NOTE — ED Triage Notes (Signed)
 Seen on Monday for same - multiple kidney stones. Started on an abx and increase her pain meds. Was being treated for bladder infection and colitis. 2 weeks ago had a kidney uretal stent placed.  Symptoms improved, however pain increased this morning, decrease in eating.  Hx of stroke, right sided deficit at baseline. Verbal deficit. Caregiver with patient.

## 2024-06-12 NOTE — ED Provider Notes (Signed)
 [facility] AT [facility] Provider Note   CSN: [id number] Arrival date & time: 06/12/24  1528     Patient presents with: Flank Pain   Melanie Madden is a 58 y.o. female past medical history significant for hypertension, kidney stones, anxiety, appendix cancer and stroke presents today for abdominal pain.  Patient was seen Monday for same and diagnosed with multiple kidney stones and colitis.  Patient was started on antibiotics and analgesics.  Patient had ureteral stent placed 2 weeks ago.  Patient reports increased pain this morning and nausea.  Patient and family members deny any fevers, chills, vomiting, diarrhea, chest pain, shortness of breath, any other complaints at this time.    Flank Pain Associated symptoms include abdominal pain.       Prior to Admission medications   Medication Sig Start Date End Date Taking? Authorizing Provider  atorvastatin  (LIPITOR) 20 MG tablet Take 1 tablet (20 mg total) by mouth daily at 6 PM. Patient taking differently: Take 20 mg by mouth in the morning. 08/20/19   Angiulli, Toribio PARAS, PA-C  ciprofloxacin  (CIPRO ) 500 MG tablet Take 1 tablet (500 mg total) by mouth every 12 (twelve) hours. 06/08/24   Ula Prentice SAUNDERS, MD  losartan (COZAAR) 100 MG tablet Take 100 mg by mouth daily. 03/06/24   [provider]  metroNIDAZOLE  (FLAGYL ) 500 MG tablet Take 1 tablet (500 mg total) by mouth 2 (two) times daily. 06/08/24   Ula Prentice SAUNDERS, MD  naproxen  sodium (ALEVE ) 220 MG tablet Take 440 mg by mouth daily with breakfast.    [provider]  oxyCODONE -acetaminophen  (PERCOCET) 7.5-325 MG tablet Take 1 tablet by mouth every 6 (six) hours as needed for severe pain (pain score 7-10). 06/08/24   Ula Prentice SAUNDERS, MD  polyethylene glycol powder (GLYCOLAX /MIRALAX ) 17 GM/SCOOP powder Take 8.5 g by mouth See admin instructions. Dissolve 8.5 grams in 4-8 ounces of liquid and drink by mouth in the morning    [provider]    Allergies: Amoxicillin and Shellfish allergy    Review of Systems  Gastrointestinal:  Positive for abdominal pain and nausea.    Updated Vital Signs BP (!) 151/69   Pulse 84   Temp 97.6 F (36.4 C) (Oral)   Resp 17   SpO2 99%   Physical Exam Vitals and nursing note reviewed.  Constitutional:      General: She is not in acute distress.    Appearance: Normal appearance. She is well-developed. She is not toxic-appearing.  HENT:     Head: Normocephalic and atraumatic.  Eyes:     Conjunctiva/sclera: Conjunctivae normal.  Cardiovascular:     Rate and Rhythm: Normal rate and regular rhythm.     Pulses: Normal pulses.     Heart sounds: Normal heart sounds. No murmur heard. Pulmonary:     Effort: Pulmonary effort is normal. No respiratory distress.     Breath sounds: Normal breath sounds.  Abdominal:     General: There is no distension.     Palpations: Abdomen is soft.     Tenderness: There is abdominal tenderness in the suprapubic area. There is no guarding.  Musculoskeletal:        General: No swelling.     Cervical back: Neck supple.  Skin:    General: Skin is warm and dry.     Capillary Refill: Capillary refill takes less than 2 seconds.  Neurological:     Mental Status: She is alert. Mental  status is at baseline.     Comments: Right sided deficits and significantly impaired speech which is baseline per patient's family members.  Psychiatric:        Mood and Affect: Mood normal.     (all labs ordered are listed, but only abnormal results are displayed) Labs Reviewed  BASIC METABOLIC PANEL WITH GFR - Abnormal; Notable for the following components:      Result Value   Glucose, Bld 116 (*)    Creatinine, Ser 1.01 (*)    All other components within normal limits  URINALYSIS, W/ REFLEX TO CULTURE (INFECTION SUSPECTED) - Abnormal; Notable for the following components:   APPearance HAZY (*)    Hgb urine dipstick LARGE (*)    Protein, ur 100 (*)     Leukocytes,Ua LARGE (*)    Non Squamous Epithelial 0-5 (*)    All other components within normal limits  URINE CULTURE  CBC    EKG: None  Radiology: CT ABDOMEN PELVIS W CONTRAST Result Date: 06/12/2024 EXAM: CT ABDOMEN AND PELVIS WITH CONTRAST 06/12/2024 06:57:12 PM TECHNIQUE: CT of the abdomen and pelvis was performed with the administration of 100 mL of iohexol  (OMNIPAQUE ) 300 MG/ML solution. Multiplanar reformatted images are provided for review. Automated exposure control, iterative reconstruction, and/or weight-based adjustment of the mA/kV was utilized to reduce the radiation dose to as low as reasonably achievable. COMPARISON: 06/08/2024 CLINICAL HISTORY: Abdominal pain, acute, nonlocalized; Abdominal/flank pain, stone suspected; Bowel obstruction suspected FINDINGS: LOWER CHEST: No acute abnormality. LIVER: The liver is unremarkable. GALLBLADDER AND BILE DUCTS: Gallbladder is unremarkable. No biliary ductal dilatation. SPLEEN: No acute abnormality. PANCREAS: No acute abnormality. 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 KIDNEYS, URETERS AND BLADDER: Right ureteral stent with proximal pigtail in renal pelvis and distal pigtail within urinary bladder lumen. Bilateral renal cortical scarring. Bilateral nephrolithiasis, nonobstructive, including a dominant 3.1 cm right upper pole renal calculus (image 37). No hydronephrosis. No perinephric or periureteral stranding. Urinary bladder is unremarkable. GI AND BOWEL: Stomach demonstrates no acute abnormality. Mild sigmoid diverticulosis, with atherosclerotic diverticulitis. Moderate colonic stool burden, consistent with mild constipation. There is no bowel obstruction. PERITONEUM AND RETROPERITONEUM: Faint omental soft tissue nodularity, for example beneath the right anterior abdominal wall (images 53 and 58), similar to priors, non-FDG avid on prior PET. No ascites. No free air.  VASCULATURE: Aorta is normal in caliber. LYMPH NODES: No lymphadenopathy. REPRODUCTIVE ORGANS: Post hysterectomy. BONES AND SOFT TISSUES: Lumbar levoscoliosis with degenerative changes most prominent at L2-L3. Stable superior endplate compression fracture deformity at L3. No focal soft tissue abnormality. IMPRESSION: 1. No acute findings. 2. Bilateral nonobstructive nephrolithiasis, including a dominant 3.1 cm right upper pole renal calculus. Indwelling right double pigtail ureteral stent. No hydronephrosis. 3. Stable omental nodularity beneath the right mid anterior abdominal wall, non-FDG avid on prior PET. Attention on follow-up is suggested. 4. Additional stable ancillary findings, as above. Electronically signed by: Pinkie Pebbles MD 06/12/2024 07:26 PM EST RP Workstation: [id number]     Procedures   Medications Ordered in the ED  cefTRIAXone  (ROCEPHIN ) 1 g in sodium chloride  0.9 % 100 mL IVPB (has no administration in time range)  morphine  (PF) 4 MG/ML injection 4 mg (4 mg Intravenous Given 06/12/24 1723)  ondansetron  (ZOFRAN ) injection 4 mg (4 mg Intravenous Given 06/12/24 1722)  iohexol  (OMNIPAQUE ) 300 MG/ML solution 100 mL (100 mLs Intravenous Contrast Given 06/12/24 1842)  morphine  (PF) 4 MG/ML injection 4 mg (4 mg  Intravenous Given 06/12/24 2043)  ondansetron  (ZOFRAN ) injection 4 mg (4 mg Intravenous Given 06/12/24 2043)  oxyCODONE -acetaminophen  (PERCOCET/ROXICET) 5-325 MG per tablet 1 tablet (1 tablet Oral Given 06/12/24 2301)  ondansetron  (ZOFRAN ) injection 4 mg (4 mg Intravenous Given 06/12/24 2301)                                    Medical Decision Making Amount and/or Complexity of Data Reviewed Labs: ordered. Radiology: ordered.  Risk Prescription drug management.   This patient presents to the ED for concern of abdominal pain differential diagnosis includes SBO, sepsis, ureteral stone, UTI, choledocholithiasis, postop pain, acute cholecystitis    Additional  history obtained   Additional history obtained from Electronic Medical Record External records from outside source obtained and reviewed including neurology notes   Lab Tests:  I Ordered, and personally interpreted labs.  The pertinent results include:  CBC unremarkable, UA with large hemoglobin, large leuks, 100 protein, greater than 50 RBCs, 11-20 squamous epithelials, greater than 50 WBCs, mildly elevated creatinine 1.01   Imaging Studies ordered:  I ordered imaging studies including CT abdomen pelvis with contrast I independently visualized and interpreted imaging which showed No acute findings.  Bilateral nonobstructive nephrolithiasis, including a dominant 3.1 cm right upper pole renal calculus. I agree with the radiologist interpretation   Medicines ordered and prescription drug management:  I ordered medication including morphine  and Zofran , Rocephin     I have reviewed the patients home medicines and have made adjustments as needed   Problem List / ED Course:  Patient having multiple doses of opioid pain management without pain relief, will consult hospitalist for admission. Consulted hospitalist, Dr. Alfornia he is agreeable to admission       Final diagnoses:  Pain of right lower extremity  Lower urinary tract infectious disease    ED Discharge Orders          Ordered    LE Venous  Status:  Canceled       Comments: IMPORTANT PATIENT INSTRUCTIONS: You have been scheduled for an Outpatient Vascular Study at [facility].  If tomorrow is a Saturday, Sunday or holiday, please go to the [facility] Emergency Department Registration Desk at 11 am tomorrow morning and tell them you are there for a vascular study.  If tomorrow is a weekday (Monday-Friday), please go to the [facility] (address [address] [place]) at 8 am and report to the 4th floor registration Zone A.  Inform registration that you are there for a vascular  study.   06/12/24 2240               Francis Ileana SAILOR, PA-C 06/12/24 2328    Neysa Caron PARAS, DO 06/12/24 2356

## 2024-06-13 ENCOUNTER — Other Ambulatory Visit: Payer: Self-pay

## 2024-06-13 ENCOUNTER — Encounter (HOSPITAL_COMMUNITY): Payer: Self-pay | Admitting: Internal Medicine

## 2024-06-13 DIAGNOSIS — M79604 Pain in right leg: Secondary | ICD-10-CM

## 2024-06-13 DIAGNOSIS — N390 Urinary tract infection, site not specified: Secondary | ICD-10-CM | POA: Diagnosis not present

## 2024-06-13 DIAGNOSIS — E785 Hyperlipidemia, unspecified: Secondary | ICD-10-CM

## 2024-06-13 DIAGNOSIS — Z8673 Personal history of transient ischemic attack (TIA), and cerebral infarction without residual deficits: Secondary | ICD-10-CM

## 2024-06-13 LAB — URINE CULTURE: Culture: <10000 — AB

## 2024-06-13 MED ORDER — OXYCODONE HCL 5 MG PO TABS
5.0000 mg | ORAL_TABLET | Freq: Four times a day (QID) | ORAL | Status: DC | PRN
  Administered 2024-06-13 – 2024-06-15 (×5): 10 mg via ORAL
  Filled 2024-06-13 (×5): qty 2

## 2024-06-13 MED ORDER — ACETAMINOPHEN 325 MG PO TABS
650.0000 mg | ORAL_TABLET | Freq: Four times a day (QID) | ORAL | Status: DC | PRN
Start: 2024-06-13 — End: 2024-06-15
  Administered 2024-06-14 – 2024-06-15 (×2): 650 mg via ORAL
  Filled 2024-06-13 (×2): qty 2

## 2024-06-13 MED ORDER — LOSARTAN POTASSIUM 50 MG PO TABS
100.0000 mg | ORAL_TABLET | Freq: Every day | ORAL | Status: DC
  Administered 2024-06-13 – 2024-06-15 (×3): 100 mg via ORAL
  Filled 2024-06-13 (×3): qty 2

## 2024-06-13 MED ORDER — ENOXAPARIN SODIUM 40 MG/0.4ML IJ SOSY
40.0000 mg | PREFILLED_SYRINGE | INTRAMUSCULAR | Status: DC
  Administered 2024-06-13 – 2024-06-15 (×3): 40 mg via SUBCUTANEOUS
  Filled 2024-06-13 (×3): qty 0.4

## 2024-06-13 MED ORDER — SODIUM CHLORIDE 0.9 % IV SOLN
1.0000 g | INTRAVENOUS | Status: DC
  Administered 2024-06-13 – 2024-06-14 (×2): 1 g via INTRAVENOUS
  Filled 2024-06-13 (×2): qty 10

## 2024-06-13 MED ORDER — ATORVASTATIN CALCIUM 20 MG PO TABS
20.0000 mg | ORAL_TABLET | Freq: Every day | ORAL | Status: DC
  Administered 2024-06-13 – 2024-06-14 (×2): 20 mg via ORAL
  Filled 2024-06-13 (×2): qty 1

## 2024-06-13 MED ORDER — NALOXONE HCL 0.4 MG/ML IJ SOLN: 0.4000 mg | INTRAMUSCULAR | Status: DC | PRN

## 2024-06-13 NOTE — H&P (Signed)
 History and Physical    Melanie Madden FMW:[id number] DOB: 1966/02/05 DOA: 06/12/2024  PCP: Cleotilde Planas, MD  Patient coming from: Home  HPI: Melanie Madden is a 58 y.o. 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  History of hypertension, anxiety, depression, hyperlipidemia, and nephrolithiasis.  Recent hospital admission 11/5-11/6 for right lower quadrant abdominal pain and imaging showed an 8 mm stone in the proximal right ureter with mild right-sided hydronephrosis for which urology was consulted and patient underwent ureteral stent placement.  Patient was seen in the ED on 11/10 for recurrent right lower quadrant abdominal pain.  Labs showing no leukocytosis, normal lipase, and normal LFTs.  CT was showing some findings consistent with colitis so she was discharged on ciprofloxacin  and metronidazole .  Patient returns to the ED due to ongoing abdominal pain.  History very limited due to patient having expressive aphasia from previous stroke.  No family member or caregiver available at this time.  Patient is reporting right lower quadrant abdominal pain and also pain in her entire right leg.  Denies nausea, vomiting, back or flank pain.  Denies shortness of breath or chest pain.  Denies any falls or trauma to her leg.  ED Course: Mildly tachycardic on arrival and heart rate subsequently improved.  SBP 140-150s.  No fever or leukocytosis.  UA with large amount of leukocytes and microscopy showing >50 RBCs, >50 WBCs, and no bacteria.  Urine culture in process.  Repeat CT abdomen pelvis showing no acute findings.  Showing bilateral nonobstructive nephrolithiasis, including a dominant 3.1 cm right upper pole renal  calculus.  Indwelling right double-pigtail ureteral stent and no hydronephrosis.  Showing stable omental nodularity beneath the right mid anterior abdominal wall, non-FDG avid on prior PET and attention on follow-up is suggested.  Right lower extremity venous Doppler ordered to rule out DVT and pending.  Patient was given morphine , Zofran , Percocet, and ceftriaxone .  Review of Systems:  Review of Systems  All other systems reviewed and are negative.   Past Medical History:  Diagnosis Date   Allergy    Anxiety    Cancer of appendix (HCC) Stage 4    Depression    Expressive aphasia    Hemiparesis (HCC)    Right side   High triglycerides    History of kidney stones    Hypertension    Kidney stones    Recurrent sinus infections    Septic shock (HCC) 03/26/2023   Septic shock (HCC) 02/08/2021   Stroke (HCC) 07/09/2019    Past Surgical History:  Procedure Laterality Date   ANTERIOR CRUCIATE LIGAMENT REPAIR Left    BUNIONECTOMY Right    CYSTOSCOPY W/ URETERAL STENT PLACEMENT Right 06/04/2024   Procedure: CYSTOSCOPY, WITH RETROGRADE PYELOGRAM AND URETERAL STENT INSERTION;  Surgeon: Selma Donnice SAUNDERS, MD;  Location: [facility];  Service: Urology;  Laterality: Right;   CYSTOSCOPY/URETEROSCOPY/HOLMIUM LASER/STENT PLACEMENT Right 02/08/2021   Procedure: CYSTOSCOPY/RETROGRADE/URETEROSCOPY/HOLMIUM LASER/STENT PLACEMENT, NEPHROSTOMY TUBE REMOVAL;  Surgeon: Devere Lonni Righter, MD;  Location: [facility];  Service: Urology;  Laterality: Right;   CYSTOSCOPY/URETEROSCOPY/HOLMIUM LASER/STENT PLACEMENT Bilateral 04/10/2023   Procedure: CYSTOSCOPY, BILATERAL URETEROSCOPY, BILATERAL RETROGRADE PYELOGRAM, HOLMIUM LASER LITHOTRIPSY, AND BILATERAL URETERAL STENT PLACEMENT, removal of percutanous stent post op;  Surgeon: Devere Lonni Righter, MD;  Location: [facility];  Service: Urology;  Laterality: Bilateral;  90 MINUTES   IR NEPHROSTOMY PLACEMENT RIGHT  12/09/2020   IR NEPHROSTOMY  PLACEMENT RIGHT  03/27/2023   LASIK  2008   LIPOSUCTION     LITHOTRIPSY     MENISCUS REPAIR Left    NASAL SEPTUM SURGERY     ROBOTIC ASSISTED TOTAL HYSTERECTOMY WITH BILATERAL SALPINGO OOPHERECTOMY Bilateral 12/06/2021   Procedure: XI ROBOTIC ASSISTED BILATERAL SALPINGO OOPHORECTOMY, TOTAL HYSTERECTOMY, PERITONEAL BIOPSY, OMENTECTOMY, LAPAROTOMY, APPENDECTOMY, PAP SMEAR;  Surgeon: Viktoria Comer SAUNDERS, MD;  Location: [facility];  Service: Gynecology;  Laterality: Bilateral;   SHOULDER SURGERY Right    VARICOSE VEIN SURGERY Left      reports that she has quit smoking. She has quit using smokeless tobacco. She reports current alcohol use. She reports that she does not use drugs.  Allergies  Allergen Reactions   Amoxicillin Nausea Only   Shellfish Allergy Diarrhea, Nausea And Vomiting and Other (See Comments)    Mussels extreme n/v    Family History  Problem Relation Age of Onset   Alzheimer's disease Mother    Hypertension Mother    Diabetes Mother    Thyroid  disease Mother    Deep vein thrombosis Maternal Grandmother    Colon cancer Neg Hx    Colon polyps Neg Hx    Esophageal cancer Neg Hx    Pancreatic cancer Neg Hx    Rectal cancer Neg Hx    Stomach cancer Neg Hx    Breast cancer Neg Hx     Prior to Admission medications   Medication Sig Start Date End Date Taking? Authorizing Provider  atorvastatin  (LIPITOR) 20 MG tablet Take 1 tablet (20 mg total) by mouth daily at 6 PM. Patient taking differently: Take 20 mg by mouth in the morning. 08/20/19   Angiulli, Toribio PARAS, PA-C  ciprofloxacin  (CIPRO ) 500 MG tablet Take 1 tablet (500 mg total) by mouth every 12 (twelve) hours. 06/08/24   Ula Prentice SAUNDERS, MD  losartan (COZAAR) 100 MG tablet Take 100 mg by mouth daily. 03/06/24   [provider]  metroNIDAZOLE  (FLAGYL ) 500 MG tablet Take 1 tablet (500 mg total) by mouth 2 (two) times daily. 06/08/24   Ula Prentice SAUNDERS, MD  naproxen  sodium (ALEVE ) 220 MG tablet Take 440 mg by mouth  daily with breakfast.    [provider]  oxyCODONE -acetaminophen  (PERCOCET) 7.5-325 MG tablet Take 1 tablet by mouth every 6 (six) hours as needed for severe pain (pain score 7-10). 06/08/24   Ula Prentice SAUNDERS, MD  polyethylene glycol powder (GLYCOLAX /MIRALAX ) 17 GM/SCOOP powder Take 8.5 g by mouth See admin instructions. Dissolve 8.5 grams in 4-8 ounces of liquid and drink by mouth in the morning    [provider]    Physical Exam: Vitals:   06/12/24 1537 06/12/24 1800 06/12/24 1934 06/12/24 2245  BP: (!) 156/96 (!) 155/95 (!) 142/92 (!) 151/69  Pulse: (!) 105 79 72 84  Resp: 18 17 16 17   Temp: 98.1 F (36.7 C)  97.8 F (36.6 C) 97.6 F (36.4 C)  TempSrc: Oral  Oral Oral  SpO2: 96% 95% 97% 99%    Physical Exam Vitals reviewed.  Constitutional:      General: She is not in acute distress. HENT:     Head: Normocephalic and atraumatic.  Eyes:     Extraocular Movements: Extraocular movements intact.  Cardiovascular:     Rate and Rhythm: Normal rate and regular rhythm.     Heart sounds: Normal  heart sounds.  Pulmonary:     Effort: Pulmonary effort is normal. No respiratory distress.     Breath sounds: Normal breath sounds.  Abdominal:     General: Bowel sounds are normal. There is no distension.     Palpations: Abdomen is soft.     Tenderness: There is abdominal tenderness. There is no guarding.     Comments: Right lower quadrant tender to palpation  Musculoskeletal:     Cervical back: Normal range of motion.     Comments: Unable to examine lower extremities as patient is wearing long pants with socks and shoes and she refused examination of her lower extremities.  Right lower extremity does not appear significantly larger in size compared to the contralateral lower extremity.  Skin:    General: Skin is warm and dry.  Neurological:     Mental Status: She is alert. Mental status is at baseline.     Labs on Admission: I have personally reviewed following labs  and imaging studies  CBC: Recent Labs  Lab 06/08/24 1113 06/12/24 1741  WBC 5.2 7.3  NEUTROABS 3.6  --   HGB 16.9* 14.6  HCT 49.8* 43.1  MCV 90.7 90.2  PLT 158 216   Basic Metabolic Panel: Recent Labs  Lab 06/08/24 1113 06/12/24 1741  NA 140 137  K 3.7 3.8  CL 103 102  CO2 25 24  GLUCOSE 121* 116*  BUN 17 19  CREATININE 0.90 1.01*  CALCIUM  10.4* 10.2   GFR: Estimated Creatinine Clearance: 69.4 mL/min (A) (by C-G formula based on SCr of 1.01 mg/dL (H)). Liver Function Tests: Recent Labs  Lab 06/08/24 1113  AST 17  ALT 19  ALKPHOS 104  BILITOT 0.5  PROT 7.2  ALBUMIN 4.1   Recent Labs  Lab 06/08/24 1113  LIPASE 19   No results for input(s): AMMONIA in the last 168 hours. Coagulation Profile: No results for input(s): INR, PROTIME in the last 168 hours. Cardiac Enzymes: No results for input(s): CKTOTAL, CKMB, CKMBINDEX, TROPONINI in the last 168 hours. BNP (last 3 results) No results for input(s): PROBNP in the last 8760 hours. HbA1C: No results for input(s): HGBA1C in the last 72 hours. CBG: No results for input(s): GLUCAP in the last 168 hours. Lipid Profile: No results for input(s): CHOL, HDL, LDLCALC, TRIG, CHOLHDL, LDLDIRECT in the last 72 hours. Thyroid  Function Tests: No results for input(s): TSH, T4TOTAL, FREET4, T3FREE, THYROIDAB in the last 72 hours. Anemia Panel: No results for input(s): VITAMINB12, FOLATE, FERRITIN, TIBC, IRON, RETICCTPCT in the last 72 hours. Urine analysis:    Component Value Date/Time   COLORURINE YELLOW 06/12/2024 1700   APPEARANCEUR HAZY (A) 06/12/2024 1700   LABSPEC 1.018 06/12/2024 1700   PHURINE 6.0 06/12/2024 1700   GLUCOSEU NEGATIVE 06/12/2024 1700   HGBUR LARGE (A) 06/12/2024 1700   BILIRUBINUR NEGATIVE 06/12/2024 1700   KETONESUR NEGATIVE 06/12/2024 1700   PROTEINUR 100 (A) 06/12/2024 1700   NITRITE NEGATIVE 06/12/2024 1700   LEUKOCYTESUR LARGE (A)  06/12/2024 1700    Radiological Exams on Admission: CT ABDOMEN PELVIS W CONTRAST Result Date: 06/12/2024 EXAM: CT ABDOMEN AND PELVIS WITH CONTRAST 06/12/2024 06:57:12 PM TECHNIQUE: CT of the abdomen and pelvis was performed with the administration of 100 mL of iohexol  (OMNIPAQUE ) 300 MG/ML solution. Multiplanar reformatted images are provided for review. Automated exposure control, iterative reconstruction, and/or weight-based adjustment of the mA/kV was utilized to reduce the radiation dose to as low as reasonably achievable. COMPARISON: 06/08/2024 CLINICAL HISTORY: Abdominal pain,  acute, nonlocalized; Abdominal/flank pain, stone suspected; Bowel obstruction suspected FINDINGS: LOWER CHEST: No acute abnormality. LIVER: The liver is unremarkable. GALLBLADDER AND BILE DUCTS: Gallbladder is unremarkable. No biliary ductal dilatation. SPLEEN: No acute abnormality. PANCREAS: No acute abnormality. 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 KIDNEYS, URETERS AND BLADDER: Right ureteral stent with proximal pigtail in renal pelvis and distal pigtail within urinary bladder lumen. Bilateral renal cortical scarring. Bilateral nephrolithiasis, nonobstructive, including a dominant 3.1 cm right upper pole renal calculus (image 37). No hydronephrosis. No perinephric or periureteral stranding. Urinary bladder is unremarkable. GI AND BOWEL: Stomach demonstrates no acute abnormality. Mild sigmoid diverticulosis, with atherosclerotic diverticulitis. Moderate colonic stool burden, consistent with mild constipation. There is no bowel obstruction. PERITONEUM AND RETROPERITONEUM: Faint omental soft tissue nodularity, for example beneath the right anterior abdominal wall (images 53 and 58), similar to priors, non-FDG avid on prior PET. No ascites. No free air. VASCULATURE: Aorta is normal in caliber. LYMPH NODES: No lymphadenopathy. REPRODUCTIVE ORGANS: Post  hysterectomy. BONES AND SOFT TISSUES: Lumbar levoscoliosis with degenerative changes most prominent at L2-L3. Stable superior endplate compression fracture deformity at L3. No focal soft tissue abnormality. IMPRESSION: 1. No acute findings. 2. Bilateral nonobstructive nephrolithiasis, including a dominant 3.1 cm right upper pole renal calculus. Indwelling right double pigtail ureteral stent. No hydronephrosis. 3. Stable omental nodularity beneath the right mid anterior abdominal wall, non-FDG avid on prior PET. Attention on follow-up is suggested. 4. Additional stable ancillary findings, as above. Electronically signed by: Pinkie Pebbles MD 06/12/2024 07:26 PM EST RP Workstation: [id number]    Assessment and Plan  Right lower quadrant abdominal pain/complicated UTI Patient is presenting with ongoing right lower quadrant abdominal pain.  Recent hospital admission 11/5-11/6 for this problem and imaging showed an 8 mm stone in the proximal right ureter with mild right-sided hydronephrosis for which urology was consulted and patient underwent ureteral stent placement.  Patient was subsequently seen in the ED on 11/10 for recurrent right lower quadrant abdominal pain.  Labs done at that time showing no leukocytosis, normal lipase, and normal LFTs.  CT was showing some findings consistent with colitis so she was discharged on ciprofloxacin  and metronidazole .  Patient returns to the ED today due to ongoing right lower quadrant abdominal pain.  No vomiting.  No fever.  No leukocytosis on repeat labs.  UA suggestive of pyuria and microscopic hematuria.  Repeat CT abdomen pelvis showing no acute findings.  Showing bilateral nonobstructive nephrolithiasis, including a dominant 3.1 cm right upper pole renal calculus.  Indwelling right double-pigtail ureteral stent and no hydronephrosis.  Showing stable omental nodularity beneath the right mid anterior abdominal wall, non-FDG avid on prior PET and attention on follow-up  is suggested.  -Continue pain management -Continue ceftriaxone  and follow-up urine culture  Right leg pain Patient refused examination of her lower extremities.  Denies any falls or trauma to her leg.  Follow-up right lower extremity Doppler which was ordered by ED provider to rule out DVT.  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   History of hemorrhagic CVA in 2020 with significant residual deficits including right hemiplegia, expressive aphasia, and dysarthria Hyperlipidemia Continue Lipitor.  Hypertension Continue losartan.  DVT prophylaxis: Lovenox  Code Status: Full Code (discussed with the patient) Level of care: Telemetry bed Admission status: It is my clinical opinion that referral for OBSERVATION is reasonable  and necessary in this patient based on the above information provided. The aforementioned taken together are felt to place the patient at high risk for further clinical deterioration. However, it is anticipated that the patient may be medically stable for discharge from the hospital within 24 to 48 hours.  Editha Ram MD [facility]  If 7PM-7AM, please contact night-coverage [email or web address]  06/13/2024, 12:18 AM

## 2024-06-13 NOTE — ED Notes (Signed)
 ED TO INPATIENT HANDOFF REPORT  Name/Age/Gender Melanie Madden 58 y.o. female  Code Status    Code Status Orders  (From admission, onward)           Start     Ordered   06/13/24 0116  Full code  Continuous       Question:  By:  Answer:  Consent: discussion documented in EHR   06/13/24 0116           Code Status History     Date Active Date Inactive Code Status Order ID Comments User Context   06/03/2024 1751 06/04/2024 2331 Full Code [id number]  Barbarann Nest, MD Inpatient   03/27/2023 0150 04/01/2023 2020 Full Code [id number]  Jude Harden GAILS, MD ED   12/06/2021 0630 12/08/2021 1526 Full Code [id number]  Micheline Eleanor BIRCH, NP Inpatient   12/16/2020 1527 12/27/2020 1604 Full Code [id number]  Maurice Sharlet RAMAN, PA-C Inpatient   12/09/2020 2338 12/16/2020 1504 Full Code [id number]  Valere Marlyce LABOR, MD ED   12/19/2019 2045 12/20/2019 1644 Full Code [id number]  Levander Houston, MD ED   07/18/2019 1405 08/21/2019 1701 Full Code [id number]  Pegge Toribio PARAS, PA-C Inpatient   07/18/2019 1405 07/18/2019 1405 Full Code [id number]  Pegge Toribio PARAS, PA-C Inpatient   07/09/2019 2001 07/18/2019 1403 Full Code [id number]  Michaela Aisha SQUIBB, MD ED       Home/SNF/Other Home  Chief Complaint Complicated UTI (urinary tract infection) [N39.0]  Level of Care/Admitting Diagnosis ED Disposition     ED Disposition  Admit   Condition  --   Comment  Hospital Area: [facility] [[id number]]  Level of Care: Telemetry [5]  Admit to tele based on following criteria: Other see comments  Comments: Close monitoring  May place patient in observation at [facility] or [facility] if equivalent level of care is available:: Yes  Diagnosis: Complicated UTI (urinary tract infection) [280444]  Admitting Physician: ALFORNIA MADISON [[id number]]  Attending Physician: ALFORNIA MADISON [[id number]]          Medical History Past Medical History:  Diagnosis Date   Allergy     Anxiety    Cancer of appendix (HCC) Stage 4    Depression    Expressive aphasia    Hemiparesis (HCC)    Right side   High triglycerides    History of kidney stones    Hypertension    Kidney stones    Recurrent sinus infections    Septic shock (HCC) 03/26/2023   Septic shock (HCC) 02/08/2021   Stroke (HCC) 07/09/2019    Allergies Allergies  Allergen Reactions   Amoxicillin Nausea Only   Shellfish Allergy Diarrhea, Nausea And Vomiting and Other (See Comments)    Mussels extreme n/v    IV Location/Drains/Wounds Patient Lines/Drains/Airways Status     Active Line/Drains/Airways     Name Placement date Placement time Site Days   Peripheral IV 06/12/24 22 G Anterior;Right Forearm 06/12/24  1720  Forearm  1   Ureteral Drain/Stent Left ureter 6 Fr. 04/10/23  1202  Left ureter  430   Ureteral Drain/Stent Right ureter 6 Fr. 04/10/23  1210  Right ureter  430   Ureteral Drain/Stent Right ureter 6 Fr. 06/04/24  1429  Right ureter  9   Wound 06/04/24 1451 Surgical Closed Surgical Incision Vagina Right 06/04/24  1451  Vagina  9            Labs/Imaging Results for orders placed or performed during the hospital encounter  of 06/12/24 (from the past 48 hours)  Urinalysis, w/ Reflex to Culture (Infection Suspected) -Urine, Clean Catch     Status: Abnormal   Collection Time: 06/12/24  5:00 PM  Result Value Ref Range   Specimen Source URINE, CLEAN CATCH    Color, Urine YELLOW YELLOW   APPearance HAZY (A) CLEAR   Specific Gravity, Urine 1.018 1.005 - 1.030   pH 6.0 5.0 - 8.0   Glucose, UA NEGATIVE NEGATIVE mg/dL   Hgb urine dipstick LARGE (A) NEGATIVE   Bilirubin Urine NEGATIVE NEGATIVE   Ketones, ur NEGATIVE NEGATIVE mg/dL   Protein, ur 899 (A) NEGATIVE mg/dL   Nitrite NEGATIVE NEGATIVE   Leukocytes,Ua LARGE (A) NEGATIVE   RBC / HPF >50 0 - 5 RBC/hpf   WBC, UA >50 0 - 5 WBC/hpf    Comment:        Reflex urine culture not performed if WBC <=10, OR if Squamous epithelial cells  >5. If Squamous epithelial cells >5 suggest recollection.    Bacteria, UA NONE SEEN NONE SEEN   Squamous Epithelial / HPF 11-20 0 - 5 /HPF   Mucus PRESENT    Hyaline Casts, UA PRESENT    Non Squamous Epithelial 0-5 (A) NONE SEEN    Comment: Performed at [facility], [address] [place], [place] [address]  Basic metabolic panel     Status: Abnormal   Collection Time: 06/12/24  5:41 PM  Result Value Ref Range   Sodium 137 135 - 145 mmol/L   Potassium 3.8 3.5 - 5.1 mmol/L   Chloride 102 98 - 111 mmol/L   CO2 24 22 - 32 mmol/L   Glucose, Bld 116 (H) 70 - 99 mg/dL    Comment: Glucose reference range applies only to samples taken after fasting for at least 8 hours.   BUN 19 6 - 20 mg/dL   Creatinine, Ser 8.98 (H) 0.44 - 1.00 mg/dL   Calcium  10.2 8.9 - 10.3 mg/dL   GFR, Estimated >39 >39 mL/min    Comment: (NOTE) Calculated using the CKD-EPI Creatinine Equation (2021)    Anion gap 12 5 - 15    Comment: Performed at [facility], [address], [place], [place] [address]  CBC     Status: None   Collection Time: 06/12/24  5:41 PM  Result Value Ref Range   WBC 7.3 4.0 - 10.5 K/uL   RBC 4.78 3.87 - 5.11 MIL/uL   Hemoglobin 14.6 12.0 - 15.0 g/dL   HCT 56.8 63.9 - 53.9 %   MCV 90.2 80.0 - 100.0 fL   MCH 30.5 26.0 - 34.0 pg   MCHC 33.9 30.0 - 36.0 g/dL   RDW 86.1 88.4 - 84.4 %   Platelets 216 150 - 400 K/uL   nRBC 0.0 0.0 - 0.2 %    Comment: Performed at [facility], [address] [place], [place] [address]   CT ABDOMEN PELVIS W CONTRAST Result Date: 06/12/2024 EXAM: CT ABDOMEN AND PELVIS WITH CONTRAST 06/12/2024 06:57:12 PM TECHNIQUE: CT of the abdomen and pelvis was performed with the administration of 100 mL of iohexol  (OMNIPAQUE ) 300 MG/ML solution. Multiplanar reformatted images are provided for review. Automated exposure control, iterative reconstruction, and/or weight-based adjustment of the mA/kV was utilized to  reduce the radiation dose to as low as reasonably achievable. COMPARISON: 06/08/2024 CLINICAL HISTORY: Abdominal pain, acute, nonlocalized; Abdominal/flank pain, stone suspected; Bowel obstruction suspected FINDINGS: LOWER CHEST: No acute abnormality. LIVER: The liver  is unremarkable. GALLBLADDER AND BILE DUCTS: Gallbladder is unremarkable. No biliary ductal dilatation. SPLEEN: No acute abnormality. PANCREAS: No acute abnormality. 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 KIDNEYS, URETERS AND BLADDER: Right ureteral stent with proximal pigtail in renal pelvis and distal pigtail within urinary bladder lumen. Bilateral renal cortical scarring. Bilateral nephrolithiasis, nonobstructive, including a dominant 3.1 cm right upper pole renal calculus (image 37). No hydronephrosis. No perinephric or periureteral stranding. Urinary bladder is unremarkable. GI AND BOWEL: Stomach demonstrates no acute abnormality. Mild sigmoid diverticulosis, with atherosclerotic diverticulitis. Moderate colonic stool burden, consistent with mild constipation. There is no bowel obstruction. PERITONEUM AND RETROPERITONEUM: Faint omental soft tissue nodularity, for example beneath the right anterior abdominal wall (images 53 and 58), similar to priors, non-FDG avid on prior PET. No ascites. No free air. VASCULATURE: Aorta is normal in caliber. LYMPH NODES: No lymphadenopathy. REPRODUCTIVE ORGANS: Post hysterectomy. BONES AND SOFT TISSUES: Lumbar levoscoliosis with degenerative changes most prominent at L2-L3. Stable superior endplate compression fracture deformity at L3. No focal soft tissue abnormality. IMPRESSION: 1. No acute findings. 2. Bilateral nonobstructive nephrolithiasis, including a dominant 3.1 cm right upper pole renal calculus. Indwelling right double pigtail ureteral stent. No hydronephrosis. 3. Stable omental nodularity beneath the right mid anterior abdominal  wall, non-FDG avid on prior PET. Attention on follow-up is suggested. 4. Additional stable ancillary findings, as above. Electronically signed by: Pinkie Pebbles MD 06/12/2024 07:26 PM EST RP Workstation: [id number]    Pending Labs Unresulted Labs (From admission, onward)     Start     Ordered   06/12/24 1933  Urine Culture  Once,   URGENT       Question:  Indication  Answer:  Suprapubic pain   06/12/24 1932            Vitals/Pain Today's Vitals   06/13/24 0900 06/13/24 0929 06/13/24 0932 06/13/24 1000  BP: (!) 167/136   (!) 148/87  Pulse:    80  Resp:    18  Temp:   98.6 F (37 C)   TempSrc:   Oral   SpO2:    94%  PainSc:  10-Worst pain ever      Isolation Precautions No active isolations  Medications Medications  atorvastatin  (LIPITOR) tablet 20 mg (has no administration in time range)  losartan (COZAAR) tablet 100 mg (100 mg Oral Given 06/13/24 0919)  acetaminophen  (TYLENOL ) tablet 650 mg (has no administration in time range)  oxyCODONE  (Oxy IR/ROXICODONE ) immediate release tablet 5-10 mg (10 mg Oral Given 06/13/24 0929)  naloxone  (NARCAN ) injection 0.4 mg (has no administration in time range)  cefTRIAXone  (ROCEPHIN ) 1 g in sodium chloride  0.9 % 100 mL IVPB (has no administration in time range)  enoxaparin  (LOVENOX ) injection 40 mg (40 mg Subcutaneous Given 06/13/24 0931)  morphine  (PF) 4 MG/ML injection 4 mg (4 mg Intravenous Given 06/12/24 1723)  ondansetron  (ZOFRAN ) injection 4 mg (4 mg Intravenous Given 06/12/24 1722)  iohexol  (OMNIPAQUE ) 300 MG/ML solution 100 mL (100 mLs Intravenous Contrast Given 06/12/24 1842)  morphine  (PF) 4 MG/ML injection 4 mg (4 mg Intravenous Given 06/12/24 2043)  ondansetron  (ZOFRAN ) injection 4 mg (4 mg Intravenous Given 06/12/24 2043)  oxyCODONE -acetaminophen  (PERCOCET/ROXICET) 5-325 MG per tablet 1 tablet (1 tablet Oral Given 06/12/24 2301)  ondansetron  (ZOFRAN ) injection 4 mg (4 mg Intravenous Given 06/12/24 2301)  cefTRIAXone   (ROCEPHIN ) 1 g in sodium chloride  0.9 % 100 mL IVPB (0 g Intravenous Stopped 06/13/24 0004)  Mobility walks with person assist

## 2024-06-13 NOTE — Progress Notes (Signed)
 Brief progress note: -Admitted earlier today.  - As per H&P done: Melanie Madden is a 58 y.o. 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  History of hypertension, anxiety, depression, hyperlipidemia, and nephrolithiasis.  Recent hospital admission 11/5-11/6 for right lower quadrant abdominal pain and imaging showed an 8 mm stone in the proximal right ureter with mild right-sided hydronephrosis for which urology was consulted and patient underwent ureteral stent placement.  Patient was seen in the ED on 11/10 for recurrent right lower quadrant abdominal pain.  Labs showing no leukocytosis, normal lipase, and normal LFTs.  CT was showing some findings consistent with colitis so she was discharged on ciprofloxacin  and metronidazole .  Patient returns to the ED due to ongoing abdominal pain.  History very limited due to patient having expressive aphasia from previous stroke.  No family member or caregiver available at this time.  Patient is reporting right lower quadrant abdominal pain and also pain in her entire right leg.  Denies nausea, vomiting, back or flank pain.  Denies shortness of breath or chest pain.  Denies any falls or trauma to her leg.   ED Course: Mildly tachycardic on arrival and heart rate subsequently improved.  SBP 140-150s.  No fever or leukocytosis.  UA with large amount of leukocytes and microscopy showing >50 RBCs, >50 WBCs, and no bacteria.  Urine culture in process.  Repeat CT abdomen pelvis showing no acute findings.  Showing bilateral nonobstructive nephrolithiasis, including a dominant 3.1 cm right upper pole renal calculus.  Indwelling right double-pigtail ureteral stent and no hydronephrosis.  Showing stable  omental nodularity beneath the right mid anterior abdominal wall, non-FDG avid on prior PET and attention on follow-up is suggested.  Right lower extremity venous Doppler ordered to rule out DVT and pending.  Patient was given morphine , Zofran , Percocet, and ceftriaxone .  06/13/2024: Seen alongside patient's husband.  Patient continues to report abdominal pain.  Awaiting Doppler ultrasound of the lower extremity.  Optimize pain control  Further management will depend on hospital course.

## 2024-06-13 NOTE — Plan of Care (Signed)
  Problem: Health Behavior/Discharge Planning: Goal: Ability to manage health-related needs will improve Outcome: Progressing   Problem: Clinical Measurements: Goal: Ability to maintain clinical measurements within normal limits will improve Outcome: Progressing Goal: Will remain free from infection Outcome: Progressing Goal: Diagnostic test results will improve Outcome: Progressing Goal: Respiratory complications will improve Outcome: Progressing Goal: Cardiovascular complication will be avoided Outcome: Progressing   Problem: Elimination: Goal: Will not experience complications related to bowel motility Outcome: Progressing   Problem: Pain Managment: Goal: General experience of comfort will improve and/or be controlled Outcome: Progressing   Problem: Coping: Goal: Level of anxiety will decrease Outcome: Not Progressing   Problem: Elimination: Goal: Will not experience complications related to urinary retention Outcome: Not Progressing

## 2024-06-14 ENCOUNTER — Observation Stay (HOSPITAL_BASED_OUTPATIENT_CLINIC_OR_DEPARTMENT_OTHER)

## 2024-06-14 DIAGNOSIS — R52 Pain, unspecified: Secondary | ICD-10-CM

## 2024-06-14 DIAGNOSIS — E785 Hyperlipidemia, unspecified: Secondary | ICD-10-CM

## 2024-06-14 DIAGNOSIS — M79604 Pain in right leg: Secondary | ICD-10-CM | POA: Diagnosis not present

## 2024-06-14 DIAGNOSIS — I10 Essential (primary) hypertension: Secondary | ICD-10-CM | POA: Diagnosis not present

## 2024-06-14 DIAGNOSIS — Z8673 Personal history of transient ischemic attack (TIA), and cerebral infarction without residual deficits: Secondary | ICD-10-CM

## 2024-06-14 DIAGNOSIS — N390 Urinary tract infection, site not specified: Secondary | ICD-10-CM | POA: Diagnosis not present

## 2024-06-14 MED ORDER — POLYETHYLENE GLYCOL 3350 17 G PO PACK
17.0000 g | PACK | Freq: Two times a day (BID) | ORAL | Status: AC
  Administered 2024-06-14 (×2): 17 g via ORAL
  Filled 2024-06-14 (×2): qty 1

## 2024-06-14 MED ORDER — SENNOSIDES-DOCUSATE SODIUM 8.6-50 MG PO TABS: 2.0000 | ORAL_TABLET | Freq: Two times a day (BID) | ORAL | Status: DC | PRN

## 2024-06-14 MED ORDER — SENNOSIDES-DOCUSATE SODIUM 8.6-50 MG PO TABS
2.0000 | ORAL_TABLET | Freq: Two times a day (BID) | ORAL | Status: AC
  Administered 2024-06-14 (×2): 2 via ORAL
  Filled 2024-06-14 (×2): qty 2

## 2024-06-14 NOTE — Progress Notes (Signed)
 PROGRESS NOTE  Melanie Madden FMW:[id number] DOB: November 23, 1965   PCP: Cleotilde Planas, MD  Patient is from: Home.  DOA: 06/12/2024 LOS: 0  Chief complaints Chief Complaint  Patient presents with   Flank Pain     Brief Narrative / Interim history: 58 year old F with PMH of stage IV appendiceal cancer with mets to ovaries and omentum s/p TAH/BSO with omentectomy and appendectomy in 2023, hemorrhagic CVA in 2020 with residual dense aphasia and right hemiaplasia, HTN, anxiety, depression, HLD, nephrolithiasis and recent hospitalization from 11/5-6 with right-sided hydronephrosis due to 8 mm obstructing stone for which she had a ureteral stent placed, returning with RLQ pain and right leg pain.  Patient was seen in ED on 11/10 with recurrent RLQ pain.  Basic labs a lipase without significant finding.  CT showed some colitis and she was discharged on Cipro  and Flagyl .  In ED, stable vitals other than mild tachycardia.  No fever or leukocytosis.  UA with hematuria and WBC but no bacteria.  CT abdomen and pelvis without acute finding.  Indwelling right double-pigtail ureteral stent in place without hydronephrosis.  CT also showed stable omental nodularity beneath the right mid anterior abdominal wall.  Urine culture and right lower extremity venous Doppler ordered.  Patient was started on IV ceftriaxone  and analgesics and admitted.  Urine culture with significant growth.  Continues to endorse right-sided pain mainly in right leg.   Subjective: Seen and examined earlier this morning.  No major events overnight or this morning.  Continues to endorse right-sided pain mainly in her abdomen and legs.  She rates her pain 10/10.  Has very dense expressive aphasia and difficult to express a problem which is frustrating for her.   Assessment and plan: RLQ abdominal pain: Continues to endorse this pain.  Also reports right-sided body pain mainly in right abdomen and leg.  I wonder if this is due  to right sided obstructive uropathy for which she was treated with ureteral stent recently.  She has history of CVA with right hemiplegia.  She could have recrudescence.  UA with pyuria and microscopic hematuria but no bacteria.  Urine culture negative.  She has tenderness across lower abdomen. -Continue IV ceftriaxone  while in house.   Right leg pain: Unclear etiology of this. 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   History of hemorrhagic CVA in 2020 with right hemiplegia and dense expressive aphasia -PT/OT eval -Continue Lipitor.   Hypertension: Normotensive. -Continue losartan.  Class I obesity Body mass index is 30.59 kg/m.           DVT prophylaxis:  enoxaparin  (LOVENOX ) injection 40 mg Start: 06/13/24 1000  Code Status: Full code Family Communication: None at bedside Level of care: Med-Surg Status is: Observation The patient will require care spanning > 2 midnights and should be moved to inpatient because: possible UTI, pain   Final disposition: home   35 minutes with more than 50% spent in reviewing records, counseling patient/family and coordinating care.  Consultants:  None  Procedures: None  Microbiology summarized: Urine culture with insignificant   Objective: Vitals:   06/13/24 1544 06/13/24 2013 06/13/24 2305 06/14/24 0625  BP: 134/80 (!) 147/88 (!) 144/87 119/85  Pulse: 86 84 80 77  Resp: 16 16 16 20   Temp: 98.2 F (36.8 C) 98.8  F (37.1 C) 99.8 F (37.7 C) 97.7 F (36.5 C)  TempSrc: Oral Oral Oral Oral  SpO2: 98% 94% 97% 100%  Weight:      Height:        Examination:  GENERAL: No apparent distress.  Nontoxic. HEENT: MMM.  Vision and hearing grossly intact.  NECK: Supple.  No apparent JVD.  RESP:  No  IWOB.  Fair aeration bilaterally. CVS:  RRR. Heart sounds normal.  ABD/GI/GU: BS+. Abd soft, NTND.  MSK/EXT:  Right sided weakness SKIN: no apparent skin lesion or wound NEURO: AA.  Oriented appropriately. Expressive aphasia, left hemiplegia.  PSYCH: Calm. Normal affect.   Sch Meds:  Scheduled Meds:  atorvastatin   20 mg Oral q1800   enoxaparin  (LOVENOX ) injection  40 mg Subcutaneous Q24H   losartan  100 mg Oral Daily   Continuous Infusions:  cefTRIAXone  (ROCEPHIN )  IV 1 g (06/13/24 2309)   PRN Meds:.acetaminophen , naLOXone  (NARCAN )  injection, oxyCODONE   Antimicrobials: Anti-infectives (From admission, onward)    Start     Dose/Rate Route Frequency Ordered Stop   06/13/24 2300  cefTRIAXone  (ROCEPHIN ) 1 g in sodium chloride  0.9 % 100 mL IVPB        1 g 200 mL/hr over 30 Minutes Intravenous Every 24 hours 06/13/24 0116     06/12/24 2330  cefTRIAXone  (ROCEPHIN ) 1 g in sodium chloride  0.9 % 100 mL IVPB        1 g 200 mL/hr over 30 Minutes Intravenous  Once 06/12/24 2320 06/13/24 0004        I have personally reviewed the following labs and images: CBC: Recent Labs  Lab 06/08/24 1113 06/12/24 1741  WBC 5.2 7.3  NEUTROABS 3.6  --   HGB 16.9* 14.6  HCT 49.8* 43.1  MCV 90.7 90.2  PLT 158 216   BMP &GFR Recent Labs  Lab 06/08/24 1113 06/12/24 1741  NA 140 137  K 3.7 3.8  CL 103 102  CO2 25 24  GLUCOSE 121* 116*  BUN 17 19  CREATININE 0.90 1.01*  CALCIUM  10.4* 10.2   Estimated Creatinine Clearance: 69.4 mL/min (A) (by C-G formula based on SCr of 1.01 mg/dL (H)). Liver & Pancreas: Recent Labs  Lab 06/08/24 1113  AST 17  ALT 19  ALKPHOS 104  BILITOT 0.5  PROT 7.2  ALBUMIN 4.1   Recent Labs  Lab 06/08/24 1113  LIPASE 19   No results for input(s): AMMONIA in the last 168 hours. Diabetic: No results for input(s): HGBA1C in the last 72 hours. No results for input(s): GLUCAP in the last 168 hours. Cardiac Enzymes: No results for input(s):  CKTOTAL, CKMB, CKMBINDEX, TROPONINI in the last 168 hours. No results for input(s): PROBNP in the last 8760 hours. Coagulation Profile: No results for input(s): INR, PROTIME in the last 168 hours. Thyroid  Function Tests: No results for input(s): TSH, T4TOTAL, FREET4, T3FREE, THYROIDAB in the last 72 hours. Lipid Profile: No results for input(s): CHOL, HDL, LDLCALC, TRIG, CHOLHDL, LDLDIRECT in the last 72 hours. Anemia Panel: No results for input(s): VITAMINB12, FOLATE, FERRITIN, TIBC, IRON, RETICCTPCT in the last 72 hours. Urine analysis:    Component Value Date/Time   COLORURINE YELLOW 06/12/2024 1700   APPEARANCEUR HAZY (A) 06/12/2024 1700   LABSPEC 1.018 06/12/2024 1700   PHURINE 6.0 06/12/2024 1700   GLUCOSEU NEGATIVE 06/12/2024 1700   HGBUR LARGE (A) 06/12/2024 1700   BILIRUBINUR NEGATIVE 06/12/2024 1700   KETONESUR NEGATIVE 06/12/2024 1700   PROTEINUR 100 (A) 06/12/2024 1700  NITRITE NEGATIVE 06/12/2024 1700   LEUKOCYTESUR LARGE (A) 06/12/2024 1700   Sepsis Labs: Invalid input(s): PROCALCITONIN, LACTICIDVEN  Microbiology: Recent Results (from the past 240 hours)  Urine Culture     Status: Abnormal   Collection Time: 06/08/24  4:20 PM   Specimen: In/Out Cath Urine  Result Value Ref Range Status   Specimen Description   Final    IN/OUT CATH URINE Performed at [facility], [address] [place], [place] [address]    Special Requests   Final    NONE Performed at [facility], [address] [place], [place] [address]    Culture MULTIPLE SPECIES PRESENT, SUGGEST RECOLLECTION (A)  Final   Report Status 06/09/2024 FINAL  Final  Urine Culture     Status: Abnormal   Collection Time: 06/12/24  7:36 PM   Specimen: Urine, Clean Catch  Result Value Ref Range Status   Specimen Description   Final    URINE, CLEAN CATCH Performed at [facility], [address] [place], [place] [address]    Special Requests   Final    NONE Performed at [facility], [address], [place], [place] [address]    Culture (A)  Final    <10,000 COLONIES/mL INSIGNIFICANT GROWTH Performed at [facility], [address] [place], [place] [address]    Report Status 06/13/2024 FINAL  Final    Radiology Studies: No results found.    Tequita Marrs T. Taylon Coole [facility]  If 7PM-7AM, please contact night-coverage [email or web address] 06/14/2024, 12:27 PM

## 2024-06-14 NOTE — Progress Notes (Signed)
 PT Cancellation Note  Patient Details Name: Melanie Madden MRN: [id number] DOB: March 03, 1966   Cancelled Treatment:    Reason Eval/Treat Not Completed:  Attempted PT eval-pt adamantly refused participation despite encouragement from therapist and husband. Husband really tried to get her to participate but was unsuccessful. Will continue to check back as schedule allows.    Dannial SQUIBB, PT Acute Rehabilitation  Office: [phone]

## 2024-06-15 ENCOUNTER — Other Ambulatory Visit (HOSPITAL_COMMUNITY): Payer: Self-pay

## 2024-06-15 ENCOUNTER — Observation Stay (HOSPITAL_COMMUNITY)

## 2024-06-15 DIAGNOSIS — M79604 Pain in right leg: Secondary | ICD-10-CM | POA: Diagnosis not present

## 2024-06-15 DIAGNOSIS — I10 Essential (primary) hypertension: Secondary | ICD-10-CM | POA: Diagnosis not present

## 2024-06-15 DIAGNOSIS — Z8673 Personal history of transient ischemic attack (TIA), and cerebral infarction without residual deficits: Secondary | ICD-10-CM | POA: Diagnosis not present

## 2024-06-15 DIAGNOSIS — N390 Urinary tract infection, site not specified: Secondary | ICD-10-CM | POA: Diagnosis not present

## 2024-06-15 LAB — MAGNESIUM: Magnesium: 2.3 mg/dL (ref 1.7–2.4)

## 2024-06-15 LAB — RENAL FUNCTION PANEL
Albumin: 3.4 g/dL — ABNORMAL LOW (ref 3.5–5.0)
Anion gap: 11 (ref 5–15)
BUN: 22 mg/dL — ABNORMAL HIGH (ref 6–20)
CO2: 23 mmol/L (ref 22–32)
Calcium: 9.7 mg/dL (ref 8.9–10.3)
Chloride: 106 mmol/L (ref 98–111)
Creatinine, Ser: 0.88 mg/dL (ref 0.44–1.00)
GFR, Estimated: >60 mL/min (ref >60)
Glucose, Bld: 116 mg/dL — ABNORMAL HIGH (ref 70–99)
Phosphorus: 3.3 mg/dL (ref 2.5–4.6)
Potassium: 3.8 mmol/L (ref 3.5–5.1)
Sodium: 139 mmol/L (ref 135–145)

## 2024-06-15 MED ORDER — POLYETHYLENE GLYCOL 3350 17 G PO PACK
34.0000 g | PACK | Freq: Two times a day (BID) | ORAL | Status: DC
  Administered 2024-06-15: 34 g via ORAL
  Filled 2024-06-15: qty 2

## 2024-06-15 MED ORDER — POLYETHYLENE GLYCOL 3350 17 G PO PACK: 17.0000 g | PACK | Freq: Two times a day (BID) | ORAL | Status: DC | PRN

## 2024-06-15 MED ORDER — SENNOSIDES-DOCUSATE SODIUM 8.6-50 MG PO TABS: 1.0000 | ORAL_TABLET | Freq: Two times a day (BID) | ORAL | Status: DC | PRN

## 2024-06-15 MED ORDER — CEFADROXIL 500 MG PO CAPS
1000.0000 mg | ORAL_CAPSULE | Freq: Two times a day (BID) | ORAL | 0 refills | Status: AC
  Filled 2024-06-15: qty 20, 5d supply, fill #0

## 2024-06-15 MED ORDER — SENNOSIDES-DOCUSATE SODIUM 8.6-50 MG PO TABS
2.0000 | ORAL_TABLET | Freq: Two times a day (BID) | ORAL | Status: DC
  Administered 2024-06-15: 2 via ORAL
  Filled 2024-06-15: qty 2

## 2024-06-15 NOTE — Progress Notes (Signed)
 OT Cancellation Note  Patient Details Name: Melanie Madden MRN: [id number] DOB: [birth year]   Cancelled Treatment:    Reason Eval/Treat Not Completed: Patient declined, no reason specified. Pt adamantly refusing despite maximum encouragement. Per nursing, pt ambulated with one person assist to bathroom for ADLs yesterday and sat up in chair. All DME needs at home are met.   Kennth Mliss Helling 06/15/2024, 10:55 AM Mliss HERO, OTR/L Acute Rehabilitation Services Office: [phone]

## 2024-06-15 NOTE — Discharge Summary (Signed)
 Physician Discharge Summary  Melanie Madden FMW:[id number] DOB: 03/08/1966 DOA: 06/12/2024  PCP: Cleotilde Planas, MD  Admit date: 06/12/2024 Discharge date: 06/15/24  Admitted From: Home Disposition: Home Recommendations for Outpatient Follow-up:  Outpatient follow-up with PCP in 1 to 2 weeks Outpatient follow-up with urology as previously planned Check CMP and CBC at follow-up Please follow up on the following pending results: None  Home Health: None.  Patient refused therapy evaluation Equipment/Devices: None.  Patient refused therapy evaluation.  Discharge Condition: Stable CODE STATUS: Full code Diet Orders (From admission, onward)     Start     Ordered   06/13/24 0116  Diet Heart Room service appropriate? Yes; Fluid consistency: Thin  Diet effective now       Question Answer Comment  Room service appropriate? Yes   Fluid consistency: Thin      06/13/24 0116             Follow-up Information     Cleotilde Planas, MD. Schedule an appointment as soon as possible for a visit in 1 week(s).   Specialty: Family Medicine Contact information: [address] 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  Patient was seen in ED on 11/10 with recurrent RLQ pain.  Basic labs a lipase without significant finding.  CT showed some colitis and she was discharged on Cipro  and Flagyl .   In ED, stable vitals other than mild tachycardia.  No fever or leukocytosis.  UA with hematuria and WBC but no  bacteria.  CT abdomen and pelvis without acute finding.  Indwelling right double-pigtail ureteral stent in place without hydronephrosis.  CT also showed stable omental nodularity beneath the right mid anterior abdominal wall.  Urine culture and right lower extremity venous Doppler ordered.  Patient was started on IV ceftriaxone  and analgesics and admitted.   Urine culture without significant growth.  Given recent urologic instrumentation/procedure, she was continued on IV ceftriaxone  in hospital and discharged on p.o. cefadroxil to complete treatment course.  Patient continued to endorse right-sided pain despite negative workup including CT abdomen and lower extremity venous Doppler raising concern for recrudescence of underlying CVA.  Patient also refused therapy evaluation multiple times despite encouragement by spouse.   See individual problem list below for more.   Problems addressed during this hospitalization RLQ abdominal pain: CT abdomen and pelvis without significant finding to explain patient's symptoms..  Right ureteral stent in place.  She has history of CVA with right hemiplegia.  She could have recrudescence.  UA with pyuria and microscopic hematuria but no bacteria.  Urine culture negative.  She has some tenderness across lower abdomen.  No rebound no guarding.  KUB on on the day of discharge with nonobstructive bowel gas pattern and moderate volume stool and contrast in sigmoid and left colon. -Received IV ceftriaxone  in house and discharged on p.o. cefadroxil. -Continue MiraLAX  and Senokot-S   Right leg pain: Unclear etiology of this.  No apparent swelling, erythema or palpable tenderness.  Lower extremity venous  Doppler negative for DVT.  Could be recrudescence of underlying CVA.  Patient refused therapy evaluation.   History of stage IV appendiceal cancer with metastasis to ovaries and omentum status post TAH/BSO with omentectomy and appendectomy in 2023 -Outpatient oncology  follow-up.   History of hemorrhagic CVA in 2020 with right hemiplegia and dense expressive aphasia - Continue Lipitor.   Hypertension: Normotensive. - Continue home meds.   Class I obesity Body mass index is 30.59 kg/m.           Consultations: None  Time spent 35  minutes  Vital signs Vitals:   06/14/24 1416 06/14/24 1738 06/14/24 2100 06/15/24 0713  BP: (!) 118/107 (!) 179/110 (!) 153/90 (!) 147/88  Pulse: 92 88  85  Temp: 97.9 F (36.6 C) 98.7 F (37.1 C) 98.4 F (36.9 C) 98 F (36.7 C)  Resp: 20 18 18 18   Height:      Weight:      SpO2: 94% 98% 98% 100%  TempSrc:  Oral Oral   BMI (Calculated):         Discharge exam  GENERAL: No apparent distress.  Nontoxic. HEENT: MMM.  Vision and hearing grossly intact.  NECK: Supple.  No apparent JVD.  RESP:  No IWOB.  Fair aeration bilaterally. CVS:  RRR. Heart sounds normal.  ABD/GI/GU: BS+. Abd soft.  Mild diffuse tenderness.  No rebound or guarding. MSK/EXT:  Right sided weakness/hemiplegia SKIN: no apparent skin lesion or wound NEURO: AA.  Oriented appropriately.  Dense expressive aphasia, left hemiplegia.  PSYCH: Frustrated and upset.  Discharge Instructions Discharge Instructions     Discharge instructions   Complete by: As directed    It has been a pleasure taking care of you!  You were hospitalized abdomen pain and right leg pain.  CT of your abdomen and pelvis, and ultrasound of your right leg did not reveal any significant finding other than some constipation to explain your symptoms.  We have started you on antibiotics for possible UTI but your urine culture came back negative.  We have decided to continue antibiotics given risk for infection with recent stent placement.   Use MiraLAX  and Senokot-S for constipation. Follow-up with your urologist as previously planned.  Follow-up with your primary care doctor in 1 to 2 weeks or sooner if needed.   Take care,   Increase activity slowly   Complete by: As  directed    No wound care   Complete by: As directed       Allergies as of 06/15/2024       Reactions   Amoxicillin Nausea Only   Shellfish Allergy Diarrhea, Nausea And Vomiting, Other (See Comments)   Mussels extreme n/v        Medication List     STOP taking these medications    Aleve  220 MG tablet Generic drug: naproxen  sodium   ciprofloxacin  500 MG tablet Commonly known as: CIPRO    metroNIDAZOLE  500 MG tablet Commonly known as: FLAGYL        TAKE these medications    atorvastatin  20 MG tablet Commonly known as: LIPITOR Take 1 tablet (20 mg total) by mouth daily at 6 PM. What changed: when to take this   cefadroxil 500 MG capsule Commonly known as: DURICEF Take 2 capsules (1,000 mg total) by mouth 2 (two) times daily for 5 days.   losartan 100 MG tablet Commonly known as: COZAAR Take 100 mg by mouth in the morning.   oxyCODONE -acetaminophen  7.5-325 MG tablet Commonly known  as: Percocet Take 1 tablet by mouth every 6 (six) hours as needed for severe pain (pain score 7-10).   polyethylene glycol powder 17 GM/SCOOP powder Commonly known as: GLYCOLAX /MIRALAX  Take 8.5 g by mouth See admin instructions. Dissolve 8.5 grams in 4-8 ounces of liquid and drink by mouth in the morning   senna-docusate 8.6-50 MG tablet Commonly known as: Senokot-S Take 1-2 tablets by mouth 2 (two) times daily between meals as needed for mild constipation or moderate constipation.         Procedures/Studies:   DG Abd Portable 1V Result Date: 06/15/2024 CLINICAL DATA:  Abdominal pain. EXAM: PORTABLE ABDOMEN - 1 VIEW COMPARISON:  CT abdomen/pelvis 06/12/2024. FINDINGS: Nonobstructive bowel-gas pattern. Moderate volume of stool and contrast is noted in the sigmoid and left. Right-sided nephroureteral stent is noted. Redemonstrated bilateral renal calculi with the largest measuring up to 2.8 cm at the upper pole right kidney. Redemonstrated levocurvature of the mid lumbar spine.  IMPRESSION: 1. Right-sided nephroureteral stent is noted. Redemonstrated bilateral renal calculi with the largest measuring up to 2.8 cm at the upper pole right kidney. 2. Nonobstructive bowel-gas pattern. Moderate volume of stool and contrast in the sigmoid and left colon. Electronically Signed   By: Harrietta Sherry M.D.   On: 06/15/2024 09:39   VAS US  LOWER EXTREMITY VENOUS (DVT) (7a-7p) Result Date: 06/15/2024  Lower Venous DVT Study Patient Name:  Melanie Madden  Date of Exam:   06/14/2024 Medical Rec #: [id number]                     Accession #:    [id number] Date of Birth: 1965/09/10                    Patient Gender: F Patient Age:   29 years Exam Location:  [facility] Procedure:      VAS US  LOWER EXTREMITY VENOUS (DVT) Referring Phys: ILEANA ECK --------------------------------------------------------------------------------  Indications: Pain. Other Indications: H/O CVA with residual deficits and H/O cancer. Comparison Study: No prior exam. Performing Technologist: Edilia Elden Appl  Examination Guidelines: A complete evaluation includes B-mode imaging, spectral Doppler, color Doppler, and power Doppler as needed of all accessible portions of each vessel. Bilateral testing is considered an integral part of a complete examination. Limited examinations for reoccurring indications may be performed as noted. The reflux portion of the exam is performed with the patient in reverse Trendelenburg.  +---------+---------------+---------+-----------+----------+--------------+ RIGHT    CompressibilityPhasicitySpontaneityPropertiesThrombus Aging +---------+---------------+---------+-----------+----------+--------------+ CFV      Full           Yes      Yes                                 +---------+---------------+---------+-----------+----------+--------------+ SFJ      Full           Yes      Yes                                  +---------+---------------+---------+-----------+----------+--------------+ FV Prox  Full                                                        +---------+---------------+---------+-----------+----------+--------------+  FV Mid   Full                                                        +---------+---------------+---------+-----------+----------+--------------+ FV DistalFull                                                        +---------+---------------+---------+-----------+----------+--------------+ PFV      Full                                                        +---------+---------------+---------+-----------+----------+--------------+ POP      Full           Yes      Yes                                 +---------+---------------+---------+-----------+----------+--------------+ PTV      Full                                                        +---------+---------------+---------+-----------+----------+--------------+ PERO     Full                                                        +---------+---------------+---------+-----------+----------+--------------+   +----+---------------+---------+-----------+----------+--------------+ LEFTCompressibilityPhasicitySpontaneityPropertiesThrombus Aging +----+---------------+---------+-----------+----------+--------------+ CFV Full           Yes      Yes                                 +----+---------------+---------+-----------+----------+--------------+ SFJ Full           Yes      Yes                                 +----+---------------+---------+-----------+----------+--------------+    Summary: RIGHT: - There is no evidence of deep vein thrombosis in the lower extremity.  - No cystic structure found in the popliteal fossa.  LEFT: - No evidence of common femoral vein obstruction.   *See table(s) above for measurements and observations. Electronically signed by Debby Robertson on 06/15/2024  at 8:10:01 AM.    Final    CT ABDOMEN PELVIS W CONTRAST Result Date: 06/12/2024 EXAM: CT ABDOMEN AND PELVIS WITH CONTRAST 06/12/2024 06:57:12 PM TECHNIQUE: CT of the abdomen and pelvis was performed with the administration of 100 mL of iohexol  (OMNIPAQUE ) 300 MG/ML solution. Multiplanar reformatted images are provided for review. Automated exposure control, iterative reconstruction, and/or weight-based adjustment of the mA/kV was utilized to reduce  the radiation dose to as low as reasonably achievable. COMPARISON: 06/08/2024 CLINICAL HISTORY: Abdominal pain, acute, nonlocalized; Abdominal/flank pain, stone suspected; Bowel obstruction suspected FINDINGS: LOWER CHEST: No acute abnormality. LIVER: The liver is unremarkable. GALLBLADDER AND BILE DUCTS: Gallbladder is unremarkable. No biliary ductal dilatation. SPLEEN: No acute abnormality. PANCREAS: No acute abnormality. 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 KIDNEYS, URETERS AND BLADDER: Right ureteral stent with proximal pigtail in renal pelvis and distal pigtail within urinary bladder lumen. Bilateral renal cortical scarring. Bilateral nephrolithiasis, nonobstructive, including a dominant 3.1 cm right upper pole renal calculus (image 37). No hydronephrosis. No perinephric or periureteral stranding. Urinary bladder is unremarkable. GI AND BOWEL: Stomach demonstrates no acute abnormality. Mild sigmoid diverticulosis, with atherosclerotic diverticulitis. Moderate colonic stool burden, consistent with mild constipation. There is no bowel obstruction. PERITONEUM AND RETROPERITONEUM: Faint omental soft tissue nodularity, for example beneath the right anterior abdominal wall (images 53 and 58), similar to priors, non-FDG avid on prior PET. No ascites. No free air. VASCULATURE: Aorta is normal in caliber. LYMPH NODES: No lymphadenopathy. REPRODUCTIVE ORGANS: Post hysterectomy. BONES AND SOFT TISSUES:  Lumbar levoscoliosis with degenerative changes most prominent at L2-L3. Stable superior endplate compression fracture deformity at L3. No focal soft tissue abnormality. IMPRESSION: 1. No acute findings. 2. Bilateral nonobstructive nephrolithiasis, including a dominant 3.1 cm right upper pole renal calculus. Indwelling right double pigtail ureteral stent. No hydronephrosis. 3. Stable omental nodularity beneath the right mid anterior abdominal wall, non-FDG avid on prior PET. Attention on follow-up is suggested. 4. Additional stable ancillary findings, as above. Electronically signed by: Pinkie Pebbles MD 06/12/2024 07:26 PM EST RP Workstation: [id number]   CT ABDOMEN PELVIS W CONTRAST Result Date: 06/08/2024 CLINICAL DATA:  Abdominal pain. EXAM: CT ABDOMEN AND PELVIS WITH CONTRAST TECHNIQUE: Multidetector CT imaging of the abdomen and pelvis was performed using the standard protocol following bolus administration of intravenous contrast. RADIATION DOSE REDUCTION: This exam was performed according to the departmental dose-optimization program which includes automated exposure control, adjustment of the mA and/or kV according to patient size and/or use of iterative reconstruction technique. CONTRAST:  OMNIPAQUE  IOHEXOL  300 MG/ML  SOLN COMPARISON:  CT abdomen pelvis dated 06/04/2024 head FINDINGS: Lower chest: The visualized lung bases are clear. No intra-abdominal free air or free fluid. Hepatobiliary: The liver is unremarkable. No biliary dilatation. The gallbladder is unremarkable Pancreas: Unremarkable. No pancreatic ductal dilatation or surrounding inflammatory changes. Spleen: Normal in size without focal abnormality. Adrenals/Urinary Tract: The right adrenal glands unremarkable. A 4 cm left adrenal mass as seen previously. Follow-up as per recommendation of prior CT. Bilateral cortical scarring and irregularity. Multiple bilateral nonobstructing renal calculi as seen on the prior CT. There has been  interval placement of a right ureteral stent with proximal end tip in the right renal pelvis and distal end in the urinary bladder. Interval resolution of the previously seen right-sided hydronephrosis. The 8 mm stone seen in the proximal right ureter on the prior CT appears to have been pushed up and now located in the collecting system of the right lower pole. No stone along the course of the ureteral stent. The left ureter and urinary bladder appear normal. Stomach/Bowel: There is mild sigmoid diverticulosis. There is somewhat diffuse thickened appearance of the sigmoid colon. Clinical correlation recommended to evaluate for possibility of colitis. Moderate amount of stool mixed with contrast noted throughout the colon. There is no bowel obstruction. The appendix is not identified, likely surgically  absent. Vascular/Lymphatic: The abdominal aorta and IVC unremarkable. No portal venous gas. There is no adenopathy. Reproductive: Hysterectomy.  No suspicious adnexal masses. Other: None Musculoskeletal: Osteopenia with degenerative changes of the spine and scoliosis. No acute osseous pathology. IMPRESSION: 1. Interval placement of a right ureteral stent with resolution of the previously seen right-sided hydronephrosis. 2. The 8 mm stone seen in the proximal right ureter on the prior CT has been pushed up and now located in the right renal inferior pole collecting system. 3. Multiple bilateral nonobstructing renal calculi. 4. Mild sigmoid diverticulosis. Somewhat diffuse thickened appearance of the sigmoid colon. Clinical correlation recommended to evaluate for possibility of colitis. No bowel obstruction. Electronically Signed   By: Vanetta Chou M.D.   On: 06/08/2024 14:26   DG C-Arm 1-60 Min-No Report Result Date: 06/04/2024 Fluoroscopy was utilized by the requesting physician.  No radiographic interpretation.   CT ABDOMEN PELVIS W CONTRAST Result Date: 06/04/2024 EXAM: CT ABDOMEN AND PELVIS WITH CONTRAST  06/04/2024 12:22:37 AM TECHNIQUE: CT of the abdomen and pelvis was performed with the administration of 100 mL of iohexol  (OMNIPAQUE ) 300 MG/ML solution. Multiplanar reformatted images are provided for review. Automated exposure control, iterative reconstruction, and/or weight-based adjustment of the mA/kV was utilized to reduce the radiation dose to as low as reasonably achievable. COMPARISON: CT abdomen and pelvis 02/06/2024 CLINICAL HISTORY: Abdominal pain, acute, nonlocalized. FINDINGS: LOWER CHEST: No acute abnormality. LIVER: The liver is unremarkable. GALLBLADDER AND BILE DUCTS: Gallbladder is unremarkable. No biliary ductal dilatation. SPLEEN: Calcified granuloma in the spleen. PANCREAS: No acute abnormality. 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 This was previously characterized as adenoma. The right adrenal gland is within normal limits. KIDNEYS, URETERS AND BLADDER: There is an 8 mm calculus in the proximal right ureter. There is mild right-sided hydronephrosis. Additional bilateral renal calculi are present with the largest in the superior pole of the right kidney measuring 15 mm. There is no left-sided hydronephrosis. There is no perinephric fat stranding. Bilateral renal cortical scarring again seen. Urinary bladder is unremarkable. GI AND BOWEL: Stomach demonstrates no acute abnormality. The appendix is not visualized. There is no bowel obstruction. PERITONEUM AND RETROPERITONEUM: There are few right-sided omental nodules measuring up to 6 mm which appear unchanged from prior. No ascites. No free air. VASCULATURE: Aorta is normal in caliber. LYMPH NODES: No lymphadenopathy. REPRODUCTIVE ORGANS: Uterus is surgically absent. BONES AND SOFT TISSUES: Moderate chronic compression deformity of L3 is unchanged. Multilevel degenerative changes affect the spine. No focal soft tissue abnormality. IMPRESSION: 1. 8 mm calculus in the proximal right ureter with  mild right-sided hydronephrosis. 2. Additional bilateral renal calculi. 3. Stable small right-sided omental nodules. 4. 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 Given size 4 cm, recommend biochemical evaluation and surgical consultation prior to considering resection, or further characterization with adrenal washout CT or chemical-shift MR if not already definitively characterized. Electronically signed by: Greig Pique MD 06/04/2024 12:30 AM EST RP Workstation: [id number]       The results of significant diagnostics from this hospitalization (including imaging, microbiology, ancillary and laboratory) are listed below for reference.     Microbiology: Recent Results (from the past 240 hours)  Urine Culture     Status: Abnormal   Collection Time: 06/08/24  4:20 PM   Specimen: In/Out Cath Urine  Result Value Ref Range Status   Specimen Description   Final    IN/OUT CATH URINE  Performed at [facility], [address] [place], [place] [address]    Special Requests   Final    NONE Performed at [facility], [address], [place], [place] [address]    Culture MULTIPLE SPECIES PRESENT, SUGGEST RECOLLECTION (A)  Final   Report Status 06/09/2024 FINAL  Final  Urine Culture     Status: Abnormal   Collection Time: 06/12/24  7:36 PM   Specimen: Urine, Clean Catch  Result Value Ref Range Status   Specimen Description   Final    URINE, CLEAN CATCH Performed at [facility], [address] [place], [place] [address]    Special Requests   Final    NONE Performed at [facility], [address] [place], [place] [address]    Culture (A)  Final    <10,000 COLONIES/mL INSIGNIFICANT GROWTH Performed at [facility], [address] [place], [place] [address]    Report Status 06/13/2024 FINAL  Final     Labs:  CBC: Recent Labs  Lab  06/12/24 1741  WBC 7.3  HGB 14.6  HCT 43.1  MCV 90.2  PLT 216   BMP &GFR Recent Labs  Lab 06/12/24 1741 06/15/24 0510  NA 137 139  K 3.8 3.8  CL 102 106  CO2 24 23  GLUCOSE 116* 116*  BUN 19 22*  CREATININE 1.01* 0.88  CALCIUM  10.2 9.7  MG  --  2.3  PHOS  --  3.3   Estimated Creatinine Clearance: 79.6 mL/min (by C-G formula based on SCr of 0.88 mg/dL). Liver & Pancreas: Recent Labs  Lab 06/15/24 0510  ALBUMIN 3.4*   No results for input(s): LIPASE, AMYLASE in the last 168 hours. No results for input(s): AMMONIA in the last 168 hours. Diabetic: No results for input(s): HGBA1C in the last 72 hours. No results for input(s): GLUCAP in the last 168 hours. Cardiac Enzymes: No results for input(s): CKTOTAL, CKMB, CKMBINDEX, TROPONINI in the last 168 hours. No results for input(s): PROBNP in the last 8760 hours. Coagulation Profile: No results for input(s): INR, PROTIME in the last 168 hours. Thyroid  Function Tests: No results for input(s): TSH, T4TOTAL, FREET4, T3FREE, THYROIDAB in the last 72 hours. Lipid Profile: No results for input(s): CHOL, HDL, LDLCALC, TRIG, CHOLHDL, LDLDIRECT in the last 72 hours. Anemia Panel: No results for input(s): VITAMINB12, FOLATE, FERRITIN, TIBC, IRON, RETICCTPCT in the last 72 hours. Urine analysis:    Component Value Date/Time   COLORURINE YELLOW 06/12/2024 1700   APPEARANCEUR HAZY (A) 06/12/2024 1700   LABSPEC 1.018 06/12/2024 1700   PHURINE 6.0 06/12/2024 1700   GLUCOSEU NEGATIVE 06/12/2024 1700   HGBUR LARGE (A) 06/12/2024 1700   BILIRUBINUR NEGATIVE 06/12/2024 1700   KETONESUR NEGATIVE 06/12/2024 1700   PROTEINUR 100 (A) 06/12/2024 1700   NITRITE NEGATIVE 06/12/2024 1700   LEUKOCYTESUR LARGE (A) 06/12/2024 1700   Sepsis Labs: Invalid input(s): PROCALCITONIN, LACTICIDVEN   SIGNED:  Donley Harland T Gerardo Caiazzo, MD  [facility] 06/15/2024, 4:53 PM

## 2024-06-15 NOTE — Progress Notes (Signed)
 PT Cancellation Note  Patient Details Name: Melanie Madden MRN: [id number] DOB: 08/01/1965   Cancelled Treatment:    Reason Eval/Treat Not Completed: Patient declined, no reason specified--pt adamantaly refused working with therapy again on today. Both husband and RN attempted to get pt to participate but were unsuccessful. RN reports pt was able to get OOB and ambulate to/from bathroom with her quad cane with +1 A on yesterday.     Dannial SQUIBB, PT Acute Rehabilitation  Office: [phone]

## 2024-06-15 NOTE — TOC Transition Note (Signed)
 Transition of Care [facility]) - Discharge Note   Patient Details  Name: Melanie Madden MRN: [id number] Date of Birth: 03-16-1966  Transition of Care [facility]) CM/SW Contact:  Bascom Service, RN Phone Number: 06/15/2024, 12:08 PM   Clinical Narrative: d/c home no CM needs.      Final next level of care: Home/Self Care Barriers to Discharge: No Barriers Identified   Patient Goals and CMS Choice            Discharge Placement                       Discharge Plan and Services Additional resources added to the After Visit Summary for                                       Social Drivers of Health (SDOH) Interventions SDOH Screenings   Food Insecurity: No Food Insecurity (06/13/2024)  Housing: Low Risk  (06/13/2024)  Transportation Needs: No Transportation Needs (06/13/2024)  Utilities: Not At Risk (06/13/2024)  Depression (PHQ2-9): Low Risk  (06/03/2024)  Tobacco Use: Medium Risk (06/13/2024)     Readmission Risk Interventions    04/01/2023   11:50 AM  Readmission Risk Prevention Plan  Post Dischage Appt Complete  Medication Screening Complete  Transportation Screening Complete

## 2024-06-15 NOTE — Progress Notes (Signed)
 Mobility Specialist - Progress Note   06/15/24 1422  Mobility  Activity Ambulated with assistance  Level of Assistance +2 (takes two people)  Distance Ambulated (ft) 20 ft  Range of Motion/Exercises Active  Activity Response Tolerated fair  Mobility visit 1 Mobility  Mobility Specialist Start Time (ACUTE ONLY) 1405  Mobility Specialist Stop Time (ACUTE ONLY) 1422  Mobility Specialist Time Calculation (min) (ACUTE ONLY) 17 min   Nursing requested assistance to mobilize pt to bathroom. Husband assisted with bed mobility and ambulation to bathroom. NT and I assisted with standing from toilet seat and ambulation to bed. At EOS was left sitting EOB with all needs met. Call bell in reach and husband in room.   Erminio Leos,  Mobility Specialist Can be reached via Secure Chat

## 2024-06-15 NOTE — Plan of Care (Signed)
   Problem: Elimination: Goal: Will not experience complications related to bowel motility Outcome: Progressing   Problem: Safety: Goal: Ability to remain free from injury will improve Outcome: Progressing   Problem: Skin Integrity: Goal: Risk for impaired skin integrity will decrease Outcome: Progressing

## 2024-06-15 NOTE — Progress Notes (Signed)
 Discharge medication delivered to nurse JINNY Cary RN

## 2024-06-15 NOTE — Progress Notes (Signed)
 AVS and discharge instructions reviewed w/ patient and patients husband. Patient discharged via husband to home.

## 2024-06-18 ENCOUNTER — Encounter: Payer: Self-pay | Admitting: Nurse Practitioner

## 2024-06-18 ENCOUNTER — Inpatient Hospital Stay: Admitting: Nurse Practitioner

## 2024-06-18 VITALS — BP 145/95 | HR 110 | Temp 98.0°F | Resp 18

## 2024-06-18 DIAGNOSIS — C181 Malignant neoplasm of appendix: Secondary | ICD-10-CM | POA: Diagnosis not present

## 2024-06-18 DIAGNOSIS — Z85038 Personal history of other malignant neoplasm of large intestine: Secondary | ICD-10-CM | POA: Diagnosis present

## 2024-06-18 NOTE — Progress Notes (Signed)
 [facility] OFFICE PROGRESS NOTE   Diagnosis: Appendix carcinoma  INTERVAL HISTORY:   Melanie Madden returns for follow-up.  She was hospitalized following office visit 06/03/2024 to evaluate abdominal pain.  CT showed a right ureter stone with mild right hydronephrosis.  She underwent a cystoscopy 06/04/2024 with no suspicious lesions, masses, stones or other pathology.  She had mild right hydronephrosis, a right ureter stent was placed.  She was discharged home 06/04/2024.  She was seen in the emergency department 06/08/2024 with abdominal pain.  CT showed the 8 mm stone seen in the proximal right ureter on the prior CT was now located in the right renal inferior pole collecting system; multiple bilateral nonobstructing renal calculi; mild sigmoid diverticulosis, somewhat diffuse thickened appearance of the sigmoid colon.  She was discharged home on oral antibiotics for colitis.    She was readmitted 06/12/2024 with abdominal pain.  CT showed no acute findings; bilateral nonobstructive nephrolithiasis including a dominant 3.1 cm right upper pole renal calculus; indwelling right ureter stent; no hydronephrosis; stable omental nodularity between the right mid anterior abdominal wall.  She is accompanied by her caregiver.  Her caregiver is not sure why she is here today.  She reports Melanie Madden is not talking today.  All information is provided by the caregiver.  She continues to have pain intermittently at the right lower abdomen.  Yesterday she was crying because of the pain.  She has not complained of pain today.  She takes tramadol  and oxycodone  as needed.  Bowels moved 4 times last night.  No hematuria or dysuria.  Overall good appetite.  She has an appointment with Dr. Saintclair tomorrow.  She agreed to talk to Dr. Cloretta.  She confirms continued pain at the right lower abdomen.  No nausea or vomiting.  Bowels are moving.  Pain medication is intermittently  effective.  Objective:  Vital signs in last 24 hours:  Blood pressure (!) 145/95, pulse (!) 110, temperature 98 F (36.7 C), temperature source Oral, resp. rate 18, SpO2 97%.   GI: Tender right lower abdomen.  No mass.     Lab Results:  Lab Results  Component Value Date   WBC 7.3 06/12/2024   HGB 14.6 06/12/2024   HCT 43.1 06/12/2024   MCV 90.2 06/12/2024   PLT 216 06/12/2024   NEUTROABS 3.6 06/08/2024    Imaging:  No results found.  Medications: I have reviewed the patient's current medications.  Assessment/Plan: Goblet cell adenocarcinoma of the appendix, stage IV, (pT4a,pNx,pM1c) 12/06/2021 TAH/BSO, omentum resection, cul-de-sac biopsy-metastatic poorly differentiated carcinoma with signet ring cells involving bilateral ovaries, the omentum, anterior cul-de-sac biopsy suspicious for malignancy.  Resection margins negative. Foundation 1-microsatellite status cannot be determined, tumor mutation burden cannot be determined, [id number], KMT2D(MLL2) MRI abdomen and pelvis 10/26/2021-new large bilateral adnexal masses concerning for bilateral ovarian neoplasm, stable 4 cm left adrenal gland lesion consistent with a benign adenoma, no evidence of omental disease, no adenopathy PET 01/17/2022-no evidence of residual or recurrent appendiceal carcinoma, small cutaneous nodule at the left posterior thorax with mild hypermetabolism, left thyroid  goiter, 4 cm benign left adrenal adenoma CTs 08/06/2022-no evidence of progressive disease, fluid adjacent to the gallbladder fundus CTs 07/03/2023: No evidence of local recurrence or metastatic disease, stable small rim of fluid at the gallbladder fundus CT abdomen/pelvis 11/27/2023-stable bilateral nephrolithiasis without obstruction, minimal stranding at the mesentery anterior to the proximal transverse colon (minimal nodular stranding was noted anterior to the ascending colon on 07/03/2023) CT chest 01/09/2024-no evidence  of metastatic disease, stable  left adrenal gland mass, no change in left thyroid  multinodular goiter CT abdomen/pelvis 02/06/2024: 7 mm and 6.5 mm nodules in the right omentum Multi bilateral renal calculi PET 02/19/2024: 2 omental nodules without abnormal hypermetabolism, left adrenal adenoma, numerous bilateral renal stones 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   Nephrolithiasis 4.   Admission 12/09/2020 with urosepsis secondary to obstructing right renal stone, status post percutaneous nephrostomy placement Cystoscopy with laser lithotripsy and right JJ stent placement 02/08/2021 5.   Admission 06/03/2024 to evaluate abdominal pain. CT showed a right ureter stone with mild right hydronephrosis.  She underwent a cystoscopy 06/04/2024 with no suspicious lesions, masses, stones or other pathology.  She had mild right hydronephrosis, a right ureter stent was placed.  She was discharged home 06/04/2024. 6.  Evaluated in the emergency department for complaint of abdominal pain 06/08/2024 CT showed the 8 mm stone seen in the proximal right ureter on the prior CT was now located in the right renal inferior pole collecting system; multiple bilateral nonobstructing renal calculi; mild sigmoid diverticulosis, somewhat diffuse thickened appearance of the sigmoid colon.  She was discharged home on oral antibiotics for colitis.   7.  Admitted 06/12/2024 with abdominal pain CT showed no acute findings; bilateral nonobstructive nephrolithiasis including a dominant 3.1 cm right upper pole renal calculus; indwelling right ureter stent; no hydronephrosis; stable omental nodularity between the right mid anterior abdominal wall.   Disposition: Melanie Madden continues to have right lower abdominal pain.  She intermittently participated in today's visit.  Etiology of the abdominal pain is unclear.  She has had 3 CT scans in the past 2 weeks with no evidence of cancer.  She has  an appointment with Dr. Helen tomorrow.  She will follow-up with urology.  She will return as scheduled 07/06/2024.  Patient seen with Dr. Cloretta.    Olam Ned ANP/GNP-BC   06/18/2024  2:06 PM  This was a shared visit with Olam Ned.  Melanie Madden was interviewed and examined.  She has been admitted on 2 occasions over the past month with abdominal pain.  Repeat CTs have not revealed a cause for the pain.  Is possible the pain is related to the right ureter stent or kidney stones.  There was no evidence of colitis on a CT this month.  She has a history of appendiceal carcinomatosis, but no progressive cancer has been confirmed.  It is possible the pain is related to tumor that is not seen on imaging studies.  We will arrange for an expedited follow-up appointment with urology.  She is scheduled to see gastroenterology tomorrow.  She will return for an office visit as scheduled 07/06/2024.  Arvella Cloretta, MD

## 2024-06-19 ENCOUNTER — Telehealth: Payer: Self-pay | Admitting: Neurology

## 2024-06-19 NOTE — Telephone Encounter (Signed)
 Pt's caregiver Giles) states pt's spouse is wanting pt seen for weakness on left and right side, pt is also on wait list.

## 2024-07-03 ENCOUNTER — Telehealth: Payer: Self-pay

## 2024-07-03 ENCOUNTER — Telehealth: Payer: Self-pay | Admitting: Oncology

## 2024-07-03 NOTE — Telephone Encounter (Signed)
 Nursing received a message from [place] in Registration stating that the patient's sister called requesting cancellation of the 07/06/2024 appointment with Dr. Cloretta but did not provide a reason.  This clinical research associate called the patient's mobile phone and spoke with the patient's spouse, who reported that the patient is home with her caregiver (the individual who initiated the cancellation request). The spouse confirmed that the appointment should be moved to the patient's next scheduled visit with the provider, as the patient recently had an unscheduled early visit on 06/18/2024.  The 07/06/2024 appointment has been canceled per the spouse's request. This note has been routed to the provider for awareness.

## 2024-07-06 ENCOUNTER — Ambulatory Visit: Payer: Self-pay | Admitting: Oncology

## 2024-07-06 ENCOUNTER — Encounter: Payer: Self-pay | Admitting: Urology

## 2024-07-06 ENCOUNTER — Other Ambulatory Visit: Payer: Self-pay | Admitting: Urology

## 2024-07-06 ENCOUNTER — Other Ambulatory Visit: Payer: Self-pay | Admitting: *Deleted

## 2024-07-06 DIAGNOSIS — C181 Malignant neoplasm of appendix: Secondary | ICD-10-CM

## 2024-07-06 DIAGNOSIS — M5386 Other specified dorsopathies, lumbar region: Secondary | ICD-10-CM

## 2024-07-09 ENCOUNTER — Other Ambulatory Visit: Payer: Self-pay | Admitting: Gastroenterology

## 2024-07-10 ENCOUNTER — Ambulatory Visit
Admission: RE | Admit: 2024-07-10 | Discharge: 2024-07-10 | Disposition: A | Source: Ambulatory Visit | Attending: Urology

## 2024-07-10 DIAGNOSIS — M5386 Other specified dorsopathies, lumbar region: Secondary | ICD-10-CM

## 2024-07-10 MED ORDER — GADOPICLENOL 0.5 MMOL/ML IV SOLN
9.0000 mL | Freq: Once | INTRAVENOUS | Status: AC | PRN
  Administered 2024-07-10: 9 mL via INTRAVENOUS

## 2024-07-15 ENCOUNTER — Emergency Department (HOSPITAL_COMMUNITY)

## 2024-07-15 ENCOUNTER — Other Ambulatory Visit: Payer: Self-pay

## 2024-07-15 ENCOUNTER — Emergency Department (HOSPITAL_COMMUNITY)
Admission: EM | Admit: 2024-07-15 | Discharge: 2024-07-15 | Disposition: A | Discharge status: Home / Self Care | Attending: Emergency Medicine | Admitting: Emergency Medicine

## 2024-07-15 DIAGNOSIS — R0789 Other chest pain: Secondary | ICD-10-CM | POA: Diagnosis present

## 2024-07-15 DIAGNOSIS — X58XXXA Exposure to other specified factors, initial encounter: Secondary | ICD-10-CM | POA: Diagnosis not present

## 2024-07-15 DIAGNOSIS — S2242XA Multiple fractures of ribs, left side, initial encounter for closed fracture: Secondary | ICD-10-CM | POA: Diagnosis not present

## 2024-07-15 DIAGNOSIS — Z859 Personal history of malignant neoplasm, unspecified: Secondary | ICD-10-CM | POA: Insufficient documentation

## 2024-07-15 LAB — COMPREHENSIVE METABOLIC PANEL WITH GFR
ALT: 60 U/L — ABNORMAL HIGH (ref 0–44)
AST: 34 U/L (ref 15–41)
Albumin: 3.5 g/dL (ref 3.5–5.0)
Alkaline Phosphatase: 218 U/L — ABNORMAL HIGH (ref 38–126)
Anion gap: 12 (ref 5–15)
BUN: 28 mg/dL — ABNORMAL HIGH (ref 6–20)
CO2: 22 mmol/L (ref 22–32)
Calcium: 9.8 mg/dL (ref 8.9–10.3)
Chloride: 107 mmol/L (ref 98–111)
Creatinine, Ser: 1.03 mg/dL — ABNORMAL HIGH (ref 0.44–1.00)
GFR, Estimated: >60 mL/min (ref >60)
Glucose, Bld: 157 mg/dL — ABNORMAL HIGH (ref 70–99)
Potassium: 4 mmol/L (ref 3.5–5.1)
Sodium: 141 mmol/L (ref 135–145)
Total Bilirubin: 0.6 mg/dL (ref 0.0–1.2)
Total Protein: 6.2 g/dL — ABNORMAL LOW (ref 6.5–8.1)

## 2024-07-15 LAB — CBC WITH DIFFERENTIAL/PLATELET
Abs Immature Granulocytes: 0.04 K/uL (ref 0.00–0.07)
Basophils Absolute: 0 K/uL (ref 0.0–0.1)
Basophils Relative: 0 %
Eosinophils Absolute: 0.1 K/uL (ref 0.0–0.5)
Eosinophils Relative: 1 %
HCT: 36.1 % (ref 36.0–46.0)
Hemoglobin: 11.7 g/dL — ABNORMAL LOW (ref 12.0–15.0)
Immature Granulocytes: 1 %
Lymphocytes Relative: 12 %
Lymphs Abs: 1 K/uL (ref 0.7–4.0)
MCH: 30.2 pg (ref 26.0–34.0)
MCHC: 32.4 g/dL (ref 30.0–36.0)
MCV: 93 fL (ref 80.0–100.0)
Monocytes Absolute: 0.8 K/uL (ref 0.1–1.0)
Monocytes Relative: 10 %
Neutro Abs: 6.2 K/uL (ref 1.7–7.7)
Neutrophils Relative %: 76 %
Platelets: 218 K/uL (ref 150–400)
RBC: 3.88 MIL/uL (ref 3.87–5.11)
RDW: 14.8 % (ref 11.5–15.5)
WBC: 8 K/uL (ref 4.0–10.5)
nRBC: 0 % (ref 0.0–0.2)

## 2024-07-15 LAB — URINALYSIS, ROUTINE W REFLEX MICROSCOPIC
Bilirubin Urine: NEGATIVE
Glucose, UA: NEGATIVE mg/dL
Ketones, ur: NEGATIVE mg/dL
Nitrite: NEGATIVE
Protein, ur: >300 mg/dL — AB
Specific Gravity, Urine: 1.02 (ref 1.005–1.030)
pH: 7 (ref 5.0–8.0)

## 2024-07-15 LAB — LIPASE, BLOOD: Lipase: 38 U/L (ref 11–51)

## 2024-07-15 LAB — URINALYSIS, MICROSCOPIC (REFLEX): RBC / HPF: >50 RBC/hpf (ref 0–5)

## 2024-07-15 LAB — TROPONIN T, HIGH SENSITIVITY
Troponin T High Sensitivity: 22 ng/L — ABNORMAL HIGH (ref 0–19)
Troponin T High Sensitivity: 19 ng/L (ref 0–19)

## 2024-07-15 LAB — D-DIMER, QUANTITATIVE: D-Dimer, Quant: 2.38 ug{FEU}/mL — ABNORMAL HIGH (ref 0.00–0.50)

## 2024-07-15 MED ORDER — OXYCODONE-ACETAMINOPHEN 7.5-325 MG PO TABS
1.0000 | ORAL_TABLET | Freq: Four times a day (QID) | ORAL | Status: DC | PRN
  Administered 2024-07-15: 15:00:00 1 via ORAL
  Filled 2024-07-15: qty 1

## 2024-07-15 MED ORDER — OXYCODONE-ACETAMINOPHEN 7.5-325 MG PO TABS: 1.0000 | ORAL_TABLET | Freq: Three times a day (TID) | ORAL | 0 refills | Status: AC | PRN

## 2024-07-15 MED ORDER — MORPHINE SULFATE (PF) 2 MG/ML IV SOLN
2.0000 mg | Freq: Once | INTRAVENOUS | Status: AC
  Administered 2024-07-15: 23:00:00 2 mg via INTRAVENOUS
  Filled 2024-07-15: qty 1

## 2024-07-15 MED ORDER — KETOROLAC TROMETHAMINE 30 MG/ML IJ SOLN
15.0000 mg | Freq: Once | INTRAMUSCULAR | Status: AC
  Administered 2024-07-15: 22:00:00 15 mg via INTRAVENOUS
  Filled 2024-07-15: qty 1

## 2024-07-15 MED ORDER — MORPHINE SULFATE (PF) 4 MG/ML IV SOLN
4.0000 mg | Freq: Once | INTRAVENOUS | Status: AC
  Administered 2024-07-15: 23:00:00 4 mg via INTRAVENOUS
  Filled 2024-07-15: qty 1

## 2024-07-15 MED ORDER — IOHEXOL 350 MG/ML SOLN
75.0000 mL | Freq: Once | INTRAVENOUS | Status: AC | PRN
  Administered 2024-07-15: 22:00:00 75 mL via INTRAVENOUS

## 2024-07-15 MED ORDER — ASPIRIN 81 MG PO CHEW
324.0000 mg | CHEWABLE_TABLET | Freq: Once | ORAL | Status: DC
  Filled 2024-07-15: qty 4

## 2024-07-15 NOTE — ED Notes (Signed)
 Pt c/o left side chest pain, MD Pamella advised and new orders placed.

## 2024-07-15 NOTE — ED Notes (Signed)
 Pt is nonverbal caregiver at bedside helping her answer questions, can say yes/no.

## 2024-07-15 NOTE — ED Triage Notes (Signed)
 Pt BIB [facility] d/t chest pain.  Aide called ems upon arrival 2 sublingula nitroglycerin was given. ems reports pt does have right sided paralysis from previous stroke And a Fracture upper r humerus from recent fall Immobile / stand w/ assistance  Allergic aspirin   135/82  Pulse 108  Cbg 175 20 g lac

## 2024-07-15 NOTE — ED Provider Notes (Signed)
 [facility] EMERGENCY DEPARTMENT AT [facility] Provider Note   CSN: [id number] Arrival date & time: 07/15/24  1304     Patient presents with: Chest Pain   Melanie Madden is a 58 y.o. female.   58 yo F with a chief complaint of left-sided chest discomfort.  This started about 4 hours ago.  Left-sided patient has trouble providing much history.  Most of the history is obtained from the family member at bedside.  It sounds like she has had some issues since she has fallen and broken her right arm.  She has had a stroke and has very little use of her right arm or leg.  Family member required to increase the amount of force required to help stand her and walk her to the bathroom.  Mostly sedentary.  Has been complaining of different pains off and on and has had multiple advanced imaging studies to assess for recurrence of cancer versus other concerning finding.     Chest Pain      Prior to Admission medications  Medication Sig Start Date End Date Taking? Authorizing Provider  atorvastatin  (LIPITOR) 20 MG tablet Take 1 tablet (20 mg total) by mouth daily at 6 PM. Patient taking differently: Take 20 mg by mouth in the morning. 08/20/19   Angiulli, Toribio PARAS, PA-C  losartan  (COZAAR ) 100 MG tablet Take 100 mg by mouth in the morning. 03/06/24   [provider]  oxyCODONE -acetaminophen  (PERCOCET) 7.5-325 MG tablet Take 1 tablet by mouth every 6 (six) hours as needed for severe pain (pain score 7-10). 06/08/24   Ula Prentice SAUNDERS, MD  polyethylene glycol powder (GLYCOLAX /MIRALAX ) 17 GM/SCOOP powder Take 8.5 g by mouth See admin instructions. Dissolve 8.5 grams in 4-8 ounces of liquid and drink by mouth in the morning    [provider]  senna-docusate (SENOKOT-S) 8.6-50 MG tablet Take 1-2 tablets by mouth 2 (two) times daily between meals as needed for mild constipation or moderate constipation. 06/15/24   Gonfa, Taye T, MD    Allergies: Amoxicillin and  Shellfish allergy    Review of Systems  Cardiovascular:  Positive for chest pain.    Updated Vital Signs BP (!) 143/81   Pulse (!) 115   Temp (!) 97.2 F (36.2 C) (Oral)   Resp (!) 24   Ht 5' 7 (1.702 m)   Wt 88 kg   SpO2 99%   BMI 30.39 kg/m   Physical Exam Vitals and nursing note reviewed.  Constitutional:      General: She is not in acute distress.    Appearance: She is well-developed. She is not diaphoretic.  HENT:     Head: Normocephalic and atraumatic.  Eyes:     Pupils: Pupils are equal, round, and reactive to light.  Cardiovascular:     Rate and Rhythm: Normal rate and regular rhythm.     Heart sounds: No murmur heard.    No friction rub. No gallop.  Pulmonary:     Effort: Pulmonary effort is normal.     Breath sounds: No wheezing or rales.  Chest:     Chest wall: Tenderness present.     Comments: Palpation over the left anterior chest wall reproduces her discomfort. Abdominal:     General: There is no distension.     Palpations: Abdomen is soft.     Tenderness: There is no abdominal tenderness.  Musculoskeletal:        General: No tenderness.     Cervical  back: Normal range of motion and neck supple.  Skin:    General: Skin is warm and dry.  Neurological:     Mental Status: She is alert and oriented to person, place, and time.  Psychiatric:        Behavior: Behavior normal.     (all labs ordered are listed, but only abnormal results are displayed) Labs Reviewed  CBC WITH DIFFERENTIAL/PLATELET - Abnormal; Notable for the following components:      Result Value   Hemoglobin 11.7 (*)    All other components within normal limits  COMPREHENSIVE METABOLIC PANEL WITH GFR - Abnormal; Notable for the following components:   Glucose, Bld 157 (*)    BUN 28 (*)    Creatinine, Ser 1.03 (*)    Total Protein 6.2 (*)    ALT 60 (*)    Alkaline Phosphatase 218 (*)    All other components within normal limits  URINALYSIS, ROUTINE W REFLEX MICROSCOPIC -  Abnormal; Notable for the following components:   Color, Urine ORANGE (*)    APPearance CLOUDY (*)    Hgb urine dipstick LARGE (*)    Protein, ur >300 (*)    Leukocytes,Ua MODERATE (*)    All other components within normal limits  URINALYSIS, MICROSCOPIC (REFLEX) - Abnormal; Notable for the following components:   Bacteria, UA RARE (*)    All other components within normal limits  TROPONIN T, HIGH SENSITIVITY - Abnormal; Notable for the following components:   Troponin T High Sensitivity 22 (*)    All other components within normal limits  LIPASE, BLOOD  TROPONIN T, HIGH SENSITIVITY    EKG: None  Radiology: [facility] Chest Port 1 View Result Date: 07/15/2024 CLINICAL DATA:  Chest pain. EXAM: PORTABLE CHEST 1 VIEW COMPARISON:  Chest radiograph dated 03/26/2023. FINDINGS: Shallow inspiration. Left lung base atelectasis or infiltrate. No large pleural effusion. No pneumothorax. Stable cardiac silhouette. No acute osseous pathology. IMPRESSION: Left lung base atelectasis or infiltrate. Electronically Signed   By: Vanetta Chou M.D.   On: 07/15/2024 14:32     Procedures   Medications Ordered in the ED  aspirin  chewable tablet 324 mg (324 mg Oral Patient Refused/Not Given 07/15/24 1409)  oxyCODONE -acetaminophen  (PERCOCET) 7.5-325 MG per tablet 1 tablet (1 tablet Oral Given 07/15/24 1459)                                    Medical Decision Making Amount and/or Complexity of Data Reviewed Labs: ordered. Radiology: ordered.  Risk OTC drugs. Prescription drug management.   58 yo F with a chief complaint of left-sided chest pain.  Going on for about 4 hours.  Hard to get a history due to prior stroke and patient with difficulty communicating.  This seems atypical reproduced on exam.  Will obtain 2 troponins chest x-ray reassess.  I think completely atypical of pulmonary embolism.  I did discuss further workup with family who agrees to hold off on CT imaging and order D-dimer  testing.  Initial round of blood work is resulted.  No acute anemia no significant electrolyte abnormalities.  Troponin very minimally elevated at 22.  Awaiting delta.  Patient care was signed out to Dr. Pamella, please see their note for further details care in the ED  The patients results and plan were reviewed and discussed.   Any x-rays performed were independently reviewed by myself.   Differential diagnosis were considered with  the presenting HPI.  Medications  aspirin  chewable tablet 324 mg (324 mg Oral Patient Refused/Not Given 07/15/24 1409)  oxyCODONE -acetaminophen  (PERCOCET) 7.5-325 MG per tablet 1 tablet (1 tablet Oral Given 07/15/24 1459)    Vitals:   07/15/24 1315 07/15/24 1318 07/15/24 1321  BP: (!) 143/81    Pulse: (!) 115    Resp: (!) 24    Temp:  (!) 97.2 F (36.2 C)   TempSrc:  Oral   SpO2: 99%    Weight:   88 kg  Height:   5' 7 (1.702 m)    Final diagnoses:  Nonspecific chest pain         Final diagnoses:  Nonspecific chest pain    ED Discharge Orders     None          Emil Share, DO 07/15/24 1520

## 2024-07-15 NOTE — ED Provider Notes (Signed)
°  Physical Exam  BP (!) 167/77   Pulse 79   Temp 98.7 F (37.1 C) (Oral)   Resp (!) 34   Ht 5' 7 (1.702 m)   Wt 88 kg   SpO2 100%   BMI 30.39 kg/m   Physical Exam Vitals and nursing note reviewed.  HENT:     Head: Normocephalic and atraumatic.  Eyes:     Pupils: Pupils are equal, round, and reactive to light.  Cardiovascular:     Rate and Rhythm: Normal rate and regular rhythm.  Pulmonary:     Effort: Pulmonary effort is normal.     Breath sounds: Normal breath sounds.  Chest:     Comments: Superior anterior left chest wall tenderness Abdominal:     Palpations: Abdomen is soft.     Tenderness: There is no abdominal tenderness.  Skin:    General: Skin is warm and dry.  Neurological:     Mental Status: She is alert.  Psychiatric:        Mood and Affect: Mood normal.     Procedures  Procedures  ED Course / MDM   Clinical Course as of 07/15/24 2304  Wed Jul 15, 2024  2246 Delta troponin flat.  Low suspicion for ACS  Discussed evaluation for PE given patient's history of cancer reported chest pain tachycardia.  D-dimer was elevated and we obtained CTA of the chest.  No evidence of PE  CTA chest does show rib fractures of ribs 2 3 and 4 on the left with 1 of these fractures displaced.  She has been stable on room air not requiring supplemental oxygen.  She would benefit from discharge with refill of her oxycodone  prescription for pain control at home no indications for admission at this time.  Husband is in agreement with this plan.  As to when the rib fractures occurred, it may have been from a fall 2 weeks ago or another fall at home since that time.  No CT of the chest was taken previously in the last 2 weeks.  Return precautions that we worrisome for respiratory compromise were discussed with the patient's husband in detail. [MP]    Clinical Course User Index [MP] Pamella Ozell LABOR, DO   Medical Decision Making I, Ozell Pamella DO, have assumed care of this patient  from the previous provider pending delta troponin reevaluation and disposition  Amount and/or Complexity of Data Reviewed Labs: ordered. Radiology: ordered.  Risk OTC drugs. Prescription drug management.          Pamella Ozell LABOR, DO 07/15/24 2304

## 2024-07-15 NOTE — Discharge Instructions (Addendum)
 You were seen in the ER for chest pain The CAT scan of the chest showed rib fractures of ribs 2 3 and 4 on the left which is likely the cause of the pain This could have occurred during a fall 2 weeks ago or during another injury We have called in a prescription for pain medicine for you to pick up from your pharmacy Give oxycodone  acetaminophen  (Percocet) up to every 8 hours for severe pain Take Tylenol  or Motrin  as directed for mild to moderate pain Return to the Emergency Department for trouble breathing severe pain or any other concerns Otherwise follow-up with your primary doctor in 1 week for reevaluation

## 2024-07-16 ENCOUNTER — Telehealth: Payer: Self-pay | Admitting: *Deleted

## 2024-07-16 NOTE — Telephone Encounter (Signed)
 Pharmacy called regarding diagnosis for pt prescribed percocet.  RNCM reviewed the chart to find pt dx was Closed fracture of multiple ribs of left side, initial encounter ICD code S22.42XA

## 2024-07-21 ENCOUNTER — Other Ambulatory Visit: Payer: Self-pay

## 2024-07-21 ENCOUNTER — Encounter (HOSPITAL_COMMUNITY): Payer: Self-pay | Admitting: Gastroenterology

## 2024-07-21 NOTE — Progress Notes (Addendum)
 Anesthesia Review:  PCP: Cardiologist : none   PPM/ ICD: Device Orders: Rep Notified:  Chest x-ray : 07/15/24- 1 view  CT Angio chest- 07/15/24  EKG :07/16/2024  Echo :2020  Stress test: Cardiac Cath :   Activity level: - pt with hx of stroke with right sided weakness and aphasia.   Sleep Study/ CPAP : none  Fasting Blood Sugar :      / Checks Blood Sugar -- times a day:    Blood Thinner/ Instructions /Last Dose: ASA / Instructions/ Last Dose :    In ED on 07/15/2024 with broken arm and fractured ribs.  Spoke with husband on 07/21/24.  Med hx and preprocedure instructons completed.  PT states he has the instructons from before when she was cancelled when she had a cold.  Husband made awaer on 07/21/24 to notify DR Saintclair on 07/21/24 of broken arm and ribs on 07/21/24.  Phone number given.  PT vioced understanding.     PT's husband reports he has bowel prep and instructionis from before.     07/15/24- labs    BLOOD PRODUCT REFUSAL

## 2024-08-04 ENCOUNTER — Ambulatory Visit (HOSPITAL_COMMUNITY): Admission: RE | Admit: 2024-08-04 | Source: Home / Self Care | Admitting: Gastroenterology

## 2024-08-04 HISTORY — DX: Deforming dorsopathy, unspecified: M43.9

## 2024-08-07 ENCOUNTER — Other Ambulatory Visit (HOSPITAL_COMMUNITY): Payer: Self-pay

## 2024-08-07 ENCOUNTER — Other Ambulatory Visit: Payer: Self-pay | Admitting: Urology

## 2024-08-10 ENCOUNTER — Other Ambulatory Visit: Payer: Self-pay | Admitting: Urology

## 2024-09-16 ENCOUNTER — Encounter (HOSPITAL_COMMUNITY): Payer: Self-pay

## 2024-09-23 ENCOUNTER — Ambulatory Visit (HOSPITAL_COMMUNITY): Admit: 2024-09-23 | Payer: Self-pay | Admitting: Urology

## 2024-09-30 ENCOUNTER — Encounter (HOSPITAL_COMMUNITY)

## 2024-10-07 ENCOUNTER — Ambulatory Visit (HOSPITAL_COMMUNITY): Admit: 2024-10-07 | Payer: Self-pay | Admitting: Urology

## 2024-11-11 ENCOUNTER — Ambulatory Visit: Admitting: Neurology
